# Patient Record
Sex: Female | Born: 1945
Health system: Southern US, Community
[De-identification: ages and names within clinical notes are randomized; demographics above are authoritative.]

## PROBLEM LIST (undated history)

## (undated) DIAGNOSIS — Z9989 Dependence on other enabling machines and devices: Secondary | ICD-10-CM

## (undated) DIAGNOSIS — I471 Supraventricular tachycardia, unspecified: Secondary | ICD-10-CM

## (undated) DIAGNOSIS — Z87442 Personal history of urinary calculi: Secondary | ICD-10-CM

## (undated) DIAGNOSIS — R7989 Other specified abnormal findings of blood chemistry: Secondary | ICD-10-CM

## (undated) DIAGNOSIS — K819 Cholecystitis, unspecified: Secondary | ICD-10-CM

## (undated) DIAGNOSIS — Z8619 Personal history of other infectious and parasitic diseases: Secondary | ICD-10-CM

## (undated) DIAGNOSIS — I499 Cardiac arrhythmia, unspecified: Secondary | ICD-10-CM

## (undated) DIAGNOSIS — Z9289 Personal history of other medical treatment: Secondary | ICD-10-CM

## (undated) DIAGNOSIS — M1990 Unspecified osteoarthritis, unspecified site: Secondary | ICD-10-CM

## (undated) DIAGNOSIS — F32A Depression, unspecified: Secondary | ICD-10-CM

## (undated) DIAGNOSIS — Z7901 Long term (current) use of anticoagulants: Secondary | ICD-10-CM

## (undated) DIAGNOSIS — G4733 Obstructive sleep apnea (adult) (pediatric): Secondary | ICD-10-CM

## (undated) DIAGNOSIS — I779 Disorder of arteries and arterioles, unspecified: Secondary | ICD-10-CM

## (undated) DIAGNOSIS — I739 Peripheral vascular disease, unspecified: Secondary | ICD-10-CM

## (undated) DIAGNOSIS — K219 Gastro-esophageal reflux disease without esophagitis: Secondary | ICD-10-CM

## (undated) DIAGNOSIS — R7303 Prediabetes: Secondary | ICD-10-CM

## (undated) DIAGNOSIS — M79629 Pain in unspecified upper arm: Secondary | ICD-10-CM

## (undated) DIAGNOSIS — Z87448 Personal history of other diseases of urinary system: Secondary | ICD-10-CM

## (undated) DIAGNOSIS — Z973 Presence of spectacles and contact lenses: Secondary | ICD-10-CM

## (undated) DIAGNOSIS — G43909 Migraine, unspecified, not intractable, without status migrainosus: Secondary | ICD-10-CM

## (undated) DIAGNOSIS — I10 Essential (primary) hypertension: Secondary | ICD-10-CM

## (undated) DIAGNOSIS — E871 Hypo-osmolality and hyponatremia: Secondary | ICD-10-CM

## (undated) DIAGNOSIS — I2510 Atherosclerotic heart disease of native coronary artery without angina pectoris: Secondary | ICD-10-CM

## (undated) DIAGNOSIS — N179 Acute kidney failure, unspecified: Secondary | ICD-10-CM

## (undated) DIAGNOSIS — N119 Chronic tubulo-interstitial nephritis, unspecified: Secondary | ICD-10-CM

## (undated) DIAGNOSIS — L0390 Cellulitis, unspecified: Secondary | ICD-10-CM

## (undated) DIAGNOSIS — J40 Bronchitis, not specified as acute or chronic: Secondary | ICD-10-CM

## (undated) DIAGNOSIS — M069 Rheumatoid arthritis, unspecified: Secondary | ICD-10-CM

## (undated) DIAGNOSIS — F329 Major depressive disorder, single episode, unspecified: Secondary | ICD-10-CM

## (undated) DIAGNOSIS — H5789 Other specified disorders of eye and adnexa: Secondary | ICD-10-CM

## (undated) DIAGNOSIS — I480 Paroxysmal atrial fibrillation: Secondary | ICD-10-CM

## (undated) DIAGNOSIS — I4892 Unspecified atrial flutter: Secondary | ICD-10-CM

## (undated) HISTORY — DX: Depression, unspecified: F32.A

## (undated) HISTORY — DX: Unspecified atrial flutter: I48.92

## (undated) HISTORY — DX: Major depressive disorder, single episode, unspecified: F32.9

## (undated) HISTORY — DX: Bronchitis, not specified as acute or chronic: J40

## (undated) HISTORY — PX: COLONOSCOPY: SHX174

## (undated) HISTORY — DX: Paroxysmal atrial fibrillation: I48.0

## (undated) HISTORY — DX: Hypo-osmolality and hyponatremia: E87.1

## (undated) HISTORY — DX: Other specified disorders of eye and adnexa: H57.89

## (undated) HISTORY — DX: Supraventricular tachycardia: I47.1

## (undated) HISTORY — PX: KNEE ARTHROSCOPY W/ MENISCAL REPAIR: SHX1877

## (undated) HISTORY — DX: Migraine, unspecified, not intractable, without status migrainosus: G43.909

## (undated) HISTORY — DX: Chronic tubulo-interstitial nephritis, unspecified: N11.9

## (undated) HISTORY — DX: Supraventricular tachycardia, unspecified: I47.10

## (undated) HISTORY — DX: Essential (primary) hypertension: I10

---

## 2002-12-30 ENCOUNTER — Other Ambulatory Visit: Admission: RE | Admit: 2002-12-30 | Discharge: 2002-12-30 | Payer: Self-pay | Admitting: Obstetrics and Gynecology

## 2004-03-02 ENCOUNTER — Other Ambulatory Visit: Admission: RE | Admit: 2004-03-02 | Discharge: 2004-03-02 | Payer: Self-pay | Admitting: Obstetrics and Gynecology

## 2004-03-08 ENCOUNTER — Ambulatory Visit (HOSPITAL_COMMUNITY): Admission: RE | Admit: 2004-03-08 | Discharge: 2004-03-08 | Payer: Self-pay | Admitting: Obstetrics and Gynecology

## 2004-06-21 ENCOUNTER — Ambulatory Visit (HOSPITAL_COMMUNITY): Admission: RE | Admit: 2004-06-21 | Discharge: 2004-06-21 | Payer: Self-pay | Admitting: Obstetrics and Gynecology

## 2004-08-22 ENCOUNTER — Ambulatory Visit: Payer: Self-pay | Admitting: Internal Medicine

## 2004-10-17 ENCOUNTER — Ambulatory Visit: Payer: Self-pay | Admitting: Internal Medicine

## 2005-05-08 ENCOUNTER — Other Ambulatory Visit: Admission: RE | Admit: 2005-05-08 | Discharge: 2005-05-08 | Payer: Self-pay | Admitting: Obstetrics and Gynecology

## 2005-12-19 ENCOUNTER — Ambulatory Visit: Payer: Self-pay | Admitting: Internal Medicine

## 2005-12-26 ENCOUNTER — Ambulatory Visit: Payer: Self-pay | Admitting: Internal Medicine

## 2006-05-14 ENCOUNTER — Ambulatory Visit: Payer: Self-pay | Admitting: Internal Medicine

## 2006-07-24 ENCOUNTER — Ambulatory Visit: Payer: Self-pay | Admitting: Internal Medicine

## 2006-08-05 ENCOUNTER — Ambulatory Visit: Payer: Self-pay

## 2006-08-05 ENCOUNTER — Encounter: Payer: Self-pay | Admitting: Cardiology

## 2006-10-23 ENCOUNTER — Ambulatory Visit: Payer: Self-pay | Admitting: Internal Medicine

## 2007-01-11 ENCOUNTER — Ambulatory Visit: Payer: Self-pay | Admitting: Family Medicine

## 2007-03-06 ENCOUNTER — Ambulatory Visit: Payer: Self-pay | Admitting: Internal Medicine

## 2007-03-17 ENCOUNTER — Encounter: Admission: RE | Admit: 2007-03-17 | Discharge: 2007-06-15 | Payer: Self-pay | Admitting: Internal Medicine

## 2007-05-05 ENCOUNTER — Ambulatory Visit: Payer: Self-pay | Admitting: Internal Medicine

## 2007-07-07 ENCOUNTER — Encounter: Admission: RE | Admit: 2007-07-07 | Discharge: 2007-09-24 | Payer: Self-pay | Admitting: Internal Medicine

## 2007-07-28 ENCOUNTER — Encounter: Payer: Self-pay | Admitting: Internal Medicine

## 2007-07-28 DIAGNOSIS — F4323 Adjustment disorder with mixed anxiety and depressed mood: Secondary | ICD-10-CM | POA: Insufficient documentation

## 2007-07-28 DIAGNOSIS — R002 Palpitations: Secondary | ICD-10-CM | POA: Insufficient documentation

## 2007-07-28 DIAGNOSIS — Z87898 Personal history of other specified conditions: Secondary | ICD-10-CM

## 2007-08-12 ENCOUNTER — Telehealth: Payer: Self-pay | Admitting: Internal Medicine

## 2007-08-14 ENCOUNTER — Encounter: Payer: Self-pay | Admitting: Internal Medicine

## 2007-08-14 ENCOUNTER — Ambulatory Visit: Payer: Self-pay | Admitting: Internal Medicine

## 2007-08-14 DIAGNOSIS — H6690 Otitis media, unspecified, unspecified ear: Secondary | ICD-10-CM | POA: Insufficient documentation

## 2007-08-14 DIAGNOSIS — M25569 Pain in unspecified knee: Secondary | ICD-10-CM

## 2007-08-14 DIAGNOSIS — J309 Allergic rhinitis, unspecified: Secondary | ICD-10-CM | POA: Insufficient documentation

## 2007-08-25 ENCOUNTER — Encounter: Admission: RE | Admit: 2007-08-25 | Discharge: 2007-08-25 | Payer: Self-pay | Admitting: Internal Medicine

## 2007-08-26 ENCOUNTER — Encounter: Payer: Self-pay | Admitting: Internal Medicine

## 2007-09-03 ENCOUNTER — Ambulatory Visit: Payer: Self-pay | Admitting: Internal Medicine

## 2007-09-16 ENCOUNTER — Encounter: Payer: Self-pay | Admitting: Internal Medicine

## 2007-10-03 ENCOUNTER — Encounter (INDEPENDENT_AMBULATORY_CARE_PROVIDER_SITE_OTHER): Payer: Self-pay | Admitting: *Deleted

## 2007-10-16 DIAGNOSIS — I480 Paroxysmal atrial fibrillation: Secondary | ICD-10-CM

## 2007-10-16 HISTORY — DX: Paroxysmal atrial fibrillation: I48.0

## 2007-11-14 ENCOUNTER — Ambulatory Visit: Payer: Self-pay | Admitting: Internal Medicine

## 2007-11-14 ENCOUNTER — Encounter (INDEPENDENT_AMBULATORY_CARE_PROVIDER_SITE_OTHER): Payer: Self-pay | Admitting: *Deleted

## 2007-11-14 DIAGNOSIS — R05 Cough: Secondary | ICD-10-CM

## 2007-11-14 DIAGNOSIS — R87619 Unspecified abnormal cytological findings in specimens from cervix uteri: Secondary | ICD-10-CM | POA: Insufficient documentation

## 2007-12-02 ENCOUNTER — Other Ambulatory Visit: Admission: RE | Admit: 2007-12-02 | Discharge: 2007-12-02 | Payer: Self-pay | Admitting: Gynecology

## 2008-02-16 ENCOUNTER — Encounter: Payer: Self-pay | Admitting: Internal Medicine

## 2008-02-17 ENCOUNTER — Ambulatory Visit: Payer: Self-pay | Admitting: Internal Medicine

## 2008-02-17 DIAGNOSIS — R55 Syncope and collapse: Secondary | ICD-10-CM | POA: Insufficient documentation

## 2008-02-19 ENCOUNTER — Encounter: Payer: Self-pay | Admitting: Internal Medicine

## 2008-02-20 ENCOUNTER — Encounter: Payer: Self-pay | Admitting: Internal Medicine

## 2008-02-20 LAB — CONVERTED CEMR LAB
ALT: 15 units/L (ref 0–35)
Alkaline Phosphatase: 51 units/L (ref 39–117)
BUN: 15 mg/dL (ref 6–23)
Bilirubin Urine: NEGATIVE
Bilirubin, Direct: 0.2 mg/dL (ref 0.0–0.3)
Calcium: 9.1 mg/dL (ref 8.4–10.5)
Cholesterol: 196 mg/dL (ref 0–200)
Creatinine, Ser: 0.79 mg/dL (ref 0.40–1.20)
HCT: 41.7 % (ref 36.0–46.0)
Hemoglobin, Urine: NEGATIVE
LDL Cholesterol: 99 mg/dL (ref 0–99)
MCHC: 32.9 g/dL (ref 30.0–36.0)
MCV: 100.5 fL — ABNORMAL HIGH (ref 78.0–100.0)
Nitrite: NEGATIVE
Protein, ur: NEGATIVE mg/dL
Sodium: 137 meq/L (ref 135–145)
Specific Gravity, Urine: 1.01 (ref 1.005–1.03)
Total Bilirubin: 0.9 mg/dL (ref 0.3–1.2)
Triglycerides: 93 mg/dL (ref <150)
Urine Glucose: NEGATIVE mg/dL
Urobilinogen, UA: 0.2 (ref 0.0–1.0)
VLDL: 19 mg/dL (ref 0–40)
Vitamin B-12: 542 pg/mL (ref 211–911)
WBC: 5.7 10*3/uL (ref 4.0–10.5)

## 2008-02-26 ENCOUNTER — Ambulatory Visit: Payer: Self-pay | Admitting: Cardiology

## 2008-04-07 ENCOUNTER — Ambulatory Visit: Payer: Self-pay | Admitting: Internal Medicine

## 2008-04-07 DIAGNOSIS — R61 Generalized hyperhidrosis: Secondary | ICD-10-CM

## 2008-05-03 ENCOUNTER — Encounter: Payer: Self-pay | Admitting: Internal Medicine

## 2008-06-03 ENCOUNTER — Ambulatory Visit: Payer: Self-pay | Admitting: Cardiovascular Disease

## 2008-06-04 ENCOUNTER — Observation Stay (HOSPITAL_COMMUNITY): Admission: EM | Admit: 2008-06-04 | Discharge: 2008-06-05 | Payer: Self-pay | Admitting: Emergency Medicine

## 2008-06-04 ENCOUNTER — Encounter: Payer: Self-pay | Admitting: Cardiology

## 2008-06-08 ENCOUNTER — Ambulatory Visit: Payer: Self-pay | Admitting: Cardiology

## 2008-06-23 ENCOUNTER — Ambulatory Visit: Payer: Self-pay | Admitting: Internal Medicine

## 2008-06-23 ENCOUNTER — Telehealth: Payer: Self-pay | Admitting: Internal Medicine

## 2008-06-23 DIAGNOSIS — R51 Headache: Secondary | ICD-10-CM

## 2008-06-23 DIAGNOSIS — H5450 Low vision, one eye, unspecified eye: Secondary | ICD-10-CM | POA: Insufficient documentation

## 2008-06-23 DIAGNOSIS — H01009 Unspecified blepharitis unspecified eye, unspecified eyelid: Secondary | ICD-10-CM | POA: Insufficient documentation

## 2008-06-23 DIAGNOSIS — R519 Headache, unspecified: Secondary | ICD-10-CM | POA: Insufficient documentation

## 2008-07-01 ENCOUNTER — Ambulatory Visit: Payer: Self-pay | Admitting: Cardiology

## 2008-07-06 ENCOUNTER — Ambulatory Visit: Payer: Self-pay | Admitting: Cardiology

## 2008-08-09 ENCOUNTER — Ambulatory Visit: Payer: Self-pay | Admitting: Internal Medicine

## 2008-09-21 ENCOUNTER — Inpatient Hospital Stay (HOSPITAL_COMMUNITY): Admission: EM | Admit: 2008-09-21 | Discharge: 2008-09-22 | Payer: Self-pay | Admitting: Emergency Medicine

## 2008-09-21 ENCOUNTER — Ambulatory Visit: Payer: Self-pay | Admitting: Cardiology

## 2008-09-29 ENCOUNTER — Ambulatory Visit: Payer: Self-pay | Admitting: Cardiology

## 2008-10-18 ENCOUNTER — Encounter: Payer: Self-pay | Admitting: Internal Medicine

## 2008-12-01 ENCOUNTER — Ambulatory Visit: Payer: Self-pay | Admitting: Cardiology

## 2008-12-15 ENCOUNTER — Encounter: Payer: Self-pay | Admitting: Gynecology

## 2008-12-15 ENCOUNTER — Other Ambulatory Visit: Admission: RE | Admit: 2008-12-15 | Discharge: 2008-12-15 | Payer: Self-pay | Admitting: Gynecology

## 2008-12-15 ENCOUNTER — Ambulatory Visit: Payer: Self-pay | Admitting: Gynecology

## 2008-12-22 ENCOUNTER — Ambulatory Visit: Payer: Self-pay | Admitting: Gynecology

## 2008-12-24 ENCOUNTER — Telehealth: Payer: Self-pay | Admitting: Internal Medicine

## 2009-01-03 ENCOUNTER — Encounter: Admission: RE | Admit: 2009-01-03 | Discharge: 2009-01-03 | Payer: Self-pay | Admitting: Gynecology

## 2009-01-07 ENCOUNTER — Ambulatory Visit: Payer: Self-pay | Admitting: Gynecology

## 2009-02-09 ENCOUNTER — Ambulatory Visit: Payer: Self-pay | Admitting: Internal Medicine

## 2009-02-09 DIAGNOSIS — K5289 Other specified noninfective gastroenteritis and colitis: Secondary | ICD-10-CM

## 2009-04-02 ENCOUNTER — Telehealth: Payer: Self-pay | Admitting: Internal Medicine

## 2009-04-12 ENCOUNTER — Ambulatory Visit: Payer: Self-pay | Admitting: Internal Medicine

## 2009-04-12 DIAGNOSIS — M791 Myalgia, unspecified site: Secondary | ICD-10-CM

## 2009-04-16 ENCOUNTER — Encounter: Payer: Self-pay | Admitting: Internal Medicine

## 2009-04-18 LAB — CONVERTED CEMR LAB
ALT: 23 units/L (ref 0–35)
AST: 29 units/L (ref 0–37)
Albumin: 4.5 g/dL (ref 3.5–5.2)
Alkaline Phosphatase: 65 units/L (ref 39–117)
BUN: 15 mg/dL (ref 6–23)
Bilirubin, Direct: 0.2 mg/dL (ref 0.0–0.3)
Chloride: 100 meq/L (ref 96–112)
Creatinine, Ser: 0.87 mg/dL (ref 0.40–1.20)
Glucose, Bld: 79 mg/dL (ref 70–99)
HCT: 38.7 % (ref 36.0–46.0)
HDL: 85 mg/dL (ref >39)
Hemoglobin, Urine: NEGATIVE
Ketones, ur: NEGATIVE mg/dL
Lymphocytes Relative: 38 % (ref 12–46)
MCV: 98.5 fL (ref 78.0–100.0)
Neutro Abs: 3.4 10*3/uL (ref 1.7–7.7)
Neutrophils Relative %: 54 % (ref 43–77)
Platelets: 195 10*3/uL (ref 150–400)
Potassium: 4.2 meq/L (ref 3.5–5.3)
RDW: 13.5 % (ref 11.5–15.5)
Sodium: 133 meq/L — ABNORMAL LOW (ref 135–145)
TSH: 2.617 microintl units/mL (ref 0.350–4.500)
Total CHOL/HDL Ratio: 2
Urobilinogen, UA: 0.2 (ref 0.0–1.0)
VLDL: 14 mg/dL (ref 0–40)

## 2009-04-20 ENCOUNTER — Ambulatory Visit: Payer: Self-pay | Admitting: Internal Medicine

## 2009-04-21 ENCOUNTER — Telehealth: Payer: Self-pay | Admitting: Internal Medicine

## 2009-04-26 ENCOUNTER — Telehealth: Payer: Self-pay | Admitting: Cardiology

## 2009-05-05 ENCOUNTER — Encounter (INDEPENDENT_AMBULATORY_CARE_PROVIDER_SITE_OTHER): Payer: Self-pay | Admitting: *Deleted

## 2009-05-18 ENCOUNTER — Ambulatory Visit: Payer: Self-pay | Admitting: Cardiology

## 2009-05-27 ENCOUNTER — Telehealth: Payer: Self-pay | Admitting: Cardiology

## 2009-06-21 ENCOUNTER — Telehealth: Payer: Self-pay | Admitting: Cardiology

## 2009-07-18 ENCOUNTER — Ambulatory Visit: Payer: Self-pay | Admitting: Internal Medicine

## 2009-07-18 DIAGNOSIS — J040 Acute laryngitis: Secondary | ICD-10-CM | POA: Insufficient documentation

## 2009-08-04 ENCOUNTER — Ambulatory Visit: Payer: Self-pay | Admitting: Endocrinology

## 2009-08-04 DIAGNOSIS — M25559 Pain in unspecified hip: Secondary | ICD-10-CM | POA: Insufficient documentation

## 2009-08-04 DIAGNOSIS — R109 Unspecified abdominal pain: Secondary | ICD-10-CM | POA: Insufficient documentation

## 2009-08-29 ENCOUNTER — Ambulatory Visit: Payer: Self-pay | Admitting: Gynecology

## 2009-10-12 ENCOUNTER — Ambulatory Visit: Payer: Self-pay | Admitting: Internal Medicine

## 2009-10-12 DIAGNOSIS — Z87891 Personal history of nicotine dependence: Secondary | ICD-10-CM

## 2009-10-12 DIAGNOSIS — J069 Acute upper respiratory infection, unspecified: Secondary | ICD-10-CM | POA: Insufficient documentation

## 2009-10-12 DIAGNOSIS — M25519 Pain in unspecified shoulder: Secondary | ICD-10-CM

## 2009-11-08 ENCOUNTER — Encounter: Payer: Self-pay | Admitting: Internal Medicine

## 2009-11-08 LAB — CONVERTED CEMR LAB
BUN: 14 mg/dL (ref 6–23)
Chloride: 99 meq/L (ref 96–112)
Creatinine, Ser: 0.81 mg/dL (ref 0.40–1.20)
Hemoglobin, Urine: NEGATIVE
Nitrite: NEGATIVE
Sodium: 134 meq/L — ABNORMAL LOW (ref 135–145)
Specific Gravity, Urine: 1.01 (ref 1.005–1.030)
Urine Glucose: NEGATIVE mg/dL
Vitamin B-12: 1032 pg/mL — ABNORMAL HIGH (ref 211–911)

## 2009-11-11 ENCOUNTER — Ambulatory Visit: Payer: Self-pay | Admitting: Internal Medicine

## 2009-11-11 DIAGNOSIS — N281 Cyst of kidney, acquired: Secondary | ICD-10-CM | POA: Insufficient documentation

## 2009-11-16 ENCOUNTER — Encounter: Admission: RE | Admit: 2009-11-16 | Discharge: 2009-11-16 | Payer: Self-pay | Admitting: Internal Medicine

## 2009-12-16 ENCOUNTER — Ambulatory Visit: Payer: Self-pay | Admitting: Gynecology

## 2009-12-16 ENCOUNTER — Other Ambulatory Visit: Admission: RE | Admit: 2009-12-16 | Discharge: 2009-12-16 | Payer: Self-pay | Admitting: Gynecology

## 2010-01-04 ENCOUNTER — Encounter: Admission: RE | Admit: 2010-01-04 | Discharge: 2010-01-04 | Payer: Self-pay | Admitting: Gynecology

## 2010-02-15 ENCOUNTER — Ambulatory Visit: Payer: Self-pay | Admitting: Gynecology

## 2010-05-16 ENCOUNTER — Ambulatory Visit: Payer: Self-pay | Admitting: Cardiology

## 2010-06-22 ENCOUNTER — Telehealth: Payer: Self-pay | Admitting: Internal Medicine

## 2010-08-09 ENCOUNTER — Ambulatory Visit: Payer: Self-pay | Admitting: Internal Medicine

## 2010-10-18 ENCOUNTER — Ambulatory Visit
Admission: RE | Admit: 2010-10-18 | Discharge: 2010-10-18 | Payer: Self-pay | Source: Home / Self Care | Attending: Internal Medicine | Admitting: Internal Medicine

## 2010-10-18 DIAGNOSIS — H1030 Unspecified acute conjunctivitis, unspecified eye: Secondary | ICD-10-CM

## 2010-10-18 HISTORY — DX: Unspecified acute conjunctivitis, unspecified eye: H10.30

## 2010-10-19 ENCOUNTER — Telehealth: Payer: Self-pay | Admitting: Internal Medicine

## 2010-11-05 ENCOUNTER — Encounter: Payer: Self-pay | Admitting: Internal Medicine

## 2010-11-14 NOTE — Assessment & Plan Note (Signed)
Summary: F1Y/ANAS   Visit Type:  1 yr f/u Primary Provider:  Tresa Garter MD  CC:  pt states she feels like her heart stops every once in a while for just a second and says this makes her burp...otherwise no other complaints today.  History of Present Illness: Amanda Wilkins comes in today for followup of her history of atrial for ablation.  She's had no clinical recurrence.  She continues to exercise on a regular basis. She is very compliant with her medications. She remains on aspirin 325 mg a day.  Current Medications (verified): 1)  Atenolol 50 Mg  Tabs (Atenolol) .... 1/2 Tab Once Daily 2)  Vitamin D3 1000 Unit  Tabs (Cholecalciferol) .[name].. 1 Qd 3)  Aspirin 325 Mg Tabs (Aspirin) .[name].. 1 Tab Once Daily 4)  Diltiazem Hcl Er Beads 180 Mg Xr24h-Cap (Diltiazem Hcl Er Beads) .[name].. 1 Tab Qd 5)  Zoloft 50 Mg Tabs (Sertraline Hcl) .[name].. 1 By Mouth Daily 6)  Premarin 0.3 Mg Tabs (Estrogens Conjugated) .[name].. 1 Tab Once Daily 7)  Medroxyprogesterone Acetate 10 Mg Tabs (Medroxyprogesterone Acetate) .[name].. 10 Days A Month  Allergies: 1)  ! Pcn  Past History:  Past Medical History: Last updated: 11/30/2008 Atrial fibrillation, h/o last episode 8/09 Dr Daleen Squibb Depression Allergic rhinitis Palpitations migraines obesity Hypertension palpatations  Past Surgical History: Last updated: 07/28/2007 Caesarean section  Family History: Last updated: 10/12/2009 Family History Hypertension Family History Breast cancer 1st degree relative - sister 76 yo Mother: deceased @ 42- cancer Father: deceased @ 74-cancer  B pancr CA  Social History: Last updated: 08/14/2007 Former Smoker Alcohol use-yes  Risk Factors: Smoking Status: quit (11/11/2009)  Review of Systems       negative history of present illness  Vital Signs:  Patient profile:   65 year old female Height:      65.5 inches Weight:      205 pounds BMI:     33.72 Pulse rate:   78 / minute Pulse rhythm:   regular BP  sitting:   138 / 80  (left arm) Cuff size:   large  Vitals Entered By: Danielle Rankin, CMA (May 16, 2010 3:18 PM)  Physical Exam  General:  obese.   Head:  normocephalic and atraumatic Eyes:  PERRLA/EOM intact; conjunctiva and lids normal. Neck:  Neck supple, no JVD. No masses, thyromegaly or abnormal cervical nodes. Chest Wall:  no deformities or breast masses noted Lungs:  Clear bilaterally to auscultation and percussion. Heart:  PMI poorly appreciated, regular rate and rhythm, normal S1-S2. Minimal peripheral edema. Carotid strokes equal bilaterally without bruits Msk:  Back normal, normal gait. Muscle strength and tone normal. Pulses:  pulses normal in all 4 extremities Extremities:  trace left pedal edema and trace right pedal edema.   Neurologic:  Alert and oriented x 3.   EKG  Procedure date:  05/16/2010  Findings:      normal sinus rhythm, low voltage QRS.  Impression & Recommendations:  Problem # 1:  ATRIAL FIBRILLATION (ICD-427.31)  Her updated medication list for this problem includes:    Atenolol 50 Mg Tabs (Atenolol) .[name]... 1/2 tab once daily    Aspirin 325 Mg Tabs (Aspirin) .[name]... 1 tab once daily  Problem # 2:  PALPITATIONS (ICD-785.1)  Her updated medication list for this problem includes:    Atenolol 50 Mg Tabs (Atenolol) .[name]... 1/2 tab once daily    Aspirin 325 Mg Tabs (Aspirin) .[name]... 1 tab once daily    Diltiazem  Hcl Er Beads 180 Mg Xr24h-cap (Diltiazem hcl er beads) .[name]... 1 tab qd  Orders: EKG w/ Interpretation (93000)  Problem # 3:  SYNCOPE (ICD-780.2) Assessment: Improved  Her updated medication list for this problem includes:    Atenolol 50 Mg Tabs (Atenolol) .[name]... 1/2 tab once daily    Aspirin 325 Mg Tabs (Aspirin) .[name]... 1 tab once daily    Diltiazem Hcl Er Beads 180 Mg Xr24h-cap (Diltiazem hcl er beads) .[name]... 1 tab qd  Patient Instructions: 1)  Your physician recommends that you schedule a follow-up appointment in: 1 year with Dr. Daleen Squibb 2)   Your physician recommends that you continue on your current medications as directed. Please refer to the Current Medication list given to you today.

## 2010-11-14 NOTE — Assessment & Plan Note (Signed)
Summary: 6 MTH FU--$50--LABS WILL BE DONE AT ANOTHER FACILITY PER PT-STC   Vital Signs:  Patient profile:   65 year old female Weight:      199 pounds Temp:     98.1 degrees F oral Pulse rate:   75 / minute BP sitting:   124 / 76  Vitals Entered By: Tora Perches (November 11, 2009 10:14 AM) CC: f/u Is Patient Diabetic? No   Primary Care Provider:  Tresa Garter MD  CC:  f/u.  History of Present Illness: The patient presents for a follow up of hypertension, renal cyst, A fib, nearsyncope  Preventive Screening-Counseling & Management  Alcohol-Tobacco     Smoking Status: quit  Current Medications (verified): 1)  Atenolol 50 Mg  Tabs (Atenolol) .[name].. 1 By Mouth Qd 2)  Vitamin B-12 Cr 1000 Mcg  Tbcr (Cyanocobalamin) .[name].. 1 Tab Once Daily 3)  Vitamin D3 1000 Unit  Tabs (Cholecalciferol) .[name].. 1 Qd 4)  Aspirin 325 Mg Tabs (Aspirin) .... Take 1/2 Tab By Mouth Every Day 5)  Diltiazem Hcl Er Beads 180 Mg Xr24h-Cap (Diltiazem Hcl Er Beads) .[name].. 1 Tab Qd 6)  Zoloft 50 Mg Tabs (Sertraline Hcl) .[name].. 1 By Mouth Daily 7)  Premarin 0.625 Mg Tabs (Estrogens Conjugated) .... Take 1 By Mouth Qd 8)  Medroxyprogesterone Acetate 10 Mg Tabs (Medroxyprogesterone Acetate) .[name].. 10 Days A Month  Allergies: 1)  ! Pcn  Past History:  Past Medical History: Last updated: 11/30/2008 Atrial fibrillation, h/o last episode 8/09 Dr Daleen Squibb Depression Allergic rhinitis Palpitations migraines obesity Hypertension palpatations  Social History: Last updated: 08/14/2007 Former Smoker Alcohol use-yes  Physical Exam  General:  NAD Nose:  WNL Mouth:  WNL Lungs:  CTA Heart:  Normal rate and regular rhythm. S1 and S2 normal without gallop, murmur, click, rub or other extra sounds. Abdomen:  Bowel sounds positive,abdomen soft and non-tender without masses, organomegaly or hernias noted.   Impression & Recommendations:  Problem # 1:  FIBRILLATION, ATRIAL (ICD-427.31) resolved Assessment  Comment Only  Her updated medication list for this problem includes:    Atenolol 50 Mg Tabs (Atenolol) .[name]... 1 by mouth qd    Aspirin 325 Mg Tabs (Aspirin) .[name]... Take 1/2 tab by mouth every day    Diltiazem Hcl Er Beads 180 Mg Xr24h-cap (Diltiazem hcl er beads) .[name]... 1 tab qd  Problem # 2:  HEADACHE (ICD-784.0) Assessment: Improved  Her updated medication list for this problem includes:    Atenolol 50 Mg Tabs (Atenolol) .[name]... 1 by mouth qd    Aspirin 325 Mg Tabs (Aspirin) .[name]... Take 1/2 tab by mouth every day  Problem # 3:  SYNCOPE (ICD-780.2) no recurrence  Problem # 4:  DEPRESSION (ICD-311) Assessment: Improved  The following medications were removed from the medication list:    Alprazolam 0.5 Mg Tabs (Alprazolam) .[name]... 1/2 to 1 two times a day as needed Her updated medication list for this problem includes:    Zoloft 50 Mg Tabs (Sertraline hcl) .[name]... 1 by mouth daily  Problem # 5:  RENAL CYST (ICD-593.2) Was checked at GYN office Dr Henderson Cloud 3-4 y ago Orders: Radiology Referral (Radiology) f/u  Complete Medication List: 1)  Atenolol 50 Mg Tabs (Atenolol) .[name].. 1 by mouth qd 2)  Vitamin B-12 Cr 1000 Mcg Tbcr (Cyanocobalamin) .[name].. 1 tab once daily 3)  Vitamin D3 1000 Unit Tabs (Cholecalciferol) .[name].. 1 qd 4)  Aspirin 325 Mg Tabs (Aspirin) .... Take 1/2 tab by mouth every day 5)  Diltiazem  Hcl Er Beads 180 Mg Xr24h-cap (Diltiazem hcl er beads) .[name].. 1 tab qd 6)  Zoloft 50 Mg Tabs (Sertraline hcl) .[name].. 1 by mouth daily 7)  Premarin 0.625 Mg Tabs (Estrogens conjugated) .... Take 1 by mouth qd 8)  Medroxyprogesterone Acetate 10 Mg Tabs (Medroxyprogesterone acetate) .[name].. 10 days a month  Patient Instructions: 1)  Please schedule a follow-up appointment in 6 months well w/labs v70.0.

## 2010-11-14 NOTE — Progress Notes (Signed)
Summary: REQ FOR RX FOR CRUISE  Phone Note Call from Patient   Summary of Call: Patient is requesting rx for motion sickness med. She is going on a 13 day cruise. She is also concerned about her "heart problem" and wants to make sure any rx prescribed will not affect this.  Initial call taken by: Lamar Sprinkles, CMA,  June 22, 2010 3:16 PM  Follow-up for Phone Call        OK scop patch: I can't guarantee no side effects - pls read drug info; OTC antivert can be used as well with less potential for side effects Follow-up by: Tresa Garter MD,  June 22, 2010 10:56 PM  Additional Follow-up for Phone Call Additional follow up Details #1::        Spoke with pt and informed her of Dr Hyman Bible advisement. Pt states she will try Antivert OTC and will see about the transderm patch with how much cost is on prescription. I sent rx electronically. Additional Follow-up by: Ami Bullins CMA,  June 23, 2010 8:43 AM    New/Updated Medications: TRANSDERM-SCOP 1.5 MG PT72 (SCOPOLAMINE BASE) change q 3 days Prescriptions: TRANSDERM-SCOP 1.5 MG PT72 (SCOPOLAMINE BASE) change q 3 days  #5 x 0   Entered by:   Ami Bullins CMA   Authorized by:   Tresa Garter MD   Signed by:   Bill Salinas CMA on 06/23/2010   Method used:   Electronically to        [facility] # [phone]* (retail)       [address]       [place], [place]  [address]       Ph: [phone]       Fax: [phone]   RxID:   [id number] TRANSDERM-SCOP 1.5 MG PT72 (SCOPOLAMINE BASE) change q 3 days  #5 x 0   Entered and Authorized by:   Tresa Garter MD   Signed by:   Tresa Garter MD on 06/22/2010   Method used:   Print then Give to Patient   RxID:   [id number]

## 2010-11-14 NOTE — Assessment & Plan Note (Signed)
Summary: FLU SHOT/PER STACY/PN  Nurse Visit   Allergies: 1)  ! Pcn  Orders Added: 1)  Admin 1st Vaccine [90471] 2)  Flu Vaccine 29yrs + [16109]       Flu Vaccine Consent Questions     Do you have a history of severe allergic reactions to this vaccine? no    Any prior history of allergic reactions to egg and/or gelatin? no    Do you have a sensitivity to the preservative Thimersol? no    Do you have a past history of Guillan-Barre Syndrome? no    Do you currently have an acute febrile illness? no    Have you ever had a severe reaction to latex? no    Vaccine information given and explained to patient? yes    Are you currently pregnant? no    Lot Number:[id number]   Exp Date:04/14/2011   Site Given  RIGHT Deltoid IM

## 2010-11-16 NOTE — Assessment & Plan Note (Signed)
Summary: GOOEY EYES --COUGH--STC   Vital Signs:  Patient profile:   65 year old female Height:      65.5 inches Weight:      205 pounds BMI:     33.72 Temp:     98.9 degrees F oral Pulse rate:   72 / minute Pulse rhythm:   regular Resp:     16 per minute BP sitting:   120 / 70  (left arm) Cuff size:   large  Vitals Entered By: Lanier Prude, CMA(AAMA) (October 18, 2010 4:37 PM) CC: cough, sore throat, sinus drainage, eyes draining X 3 days Is Patient Diabetic? No   Primary Care Provider:  Georgina Quint Plotnikov MD  CC:  cough, sore throat, sinus drainage, and eyes draining X 3 days.  History of Present Illness: The patient presents with complaints of sore throat, fever, cough, sinus congestion and drainge of several days duration. Not better with OTC meds. Chest hurts with coughing. Can't sleep due to cough. Muscle aches are present.  The mucus is colored green. Eyes with d/c  Current Medications (verified): 1)  Atenolol 50 Mg  Tabs (Atenolol) .... 1/2 Tab Once Daily 2)  Vitamin D3 1000 Unit  Tabs (Cholecalciferol) .[name].. 1 Qd 3)  Aspirin 325 Mg Tabs (Aspirin) .[name].. 1 Tab Once Daily 4)  Diltiazem Hcl Er Beads 180 Mg Xr24h-Cap (Diltiazem Hcl Er Beads) .[name].. 1 Tab Qd 5)  Zoloft 50 Mg Tabs (Sertraline Hcl) .[name].. 1 By Mouth Daily 6)  Premarin 0.3 Mg Tabs (Estrogens Conjugated) .[name].. 1 Tab Once Daily 7)  Medroxyprogesterone Acetate 10 Mg Tabs (Medroxyprogesterone Acetate) .[name].. 10 Days A Month 8)  Transderm-Scop 1.5 Mg Pt72 (Scopolamine Base) .... Change Q 3 Days  Allergies (verified): 1)  ! Pcn  Past History:  Past Medical History: Last updated: 11/30/2008 Atrial fibrillation, h/o last episode 8/09 Dr Daleen Squibb Depression Allergic rhinitis Palpitations migraines obesity Hypertension palpatations  Social History: Last updated: 08/14/2007 Former Smoker Alcohol use-yes  Review of Systems  The patient denies fever, chest pain, prolonged cough, abdominal pain, melena, difficulty  walking, and depression.    Physical Exam  General:  NAD Eyes:  mild conjunctivitis B Ears:  TMs NL B Mouth:  Erythematous throat and intranasal mucosa c/w URI  Neck:  No deformities, masses, or tenderness noted. Lungs:  CTA Heart:  Normal rate and regular rhythm. S1 and S2 normal without gallop, murmur, click, rub or other extra sounds. Abdomen:  Bowel sounds positive,abdomen soft and non-tender without masses, organomegaly or hernias noted. Msk:  wnl Neurologic:  alert & oriented X3.   Skin:  Intact without suspicious lesions or rashes Psych:  Sad   Impression & Recommendations:  Problem # 1:  UPPER RESPIRATORY INFECTION, ACUTE (ICD-465.9) Assessment New Zpac Her updated medication list for this problem includes:    Aspirin 325 Mg Tabs (Aspirin) .[name]... 1 tab once daily    Tessalon Perles 100 Mg Caps (Benzonatate) .[name]... 1-2 by mouth two times a day as needed cogh    Promethazine-codeine 6.25-10 Mg/75ml Syrp (Promethazine-codeine) .[name]... 5-10 ml by mouth q id as needed cough  Problem # 2:  CONJUNCTIVITIS, ACUTE (ICD-372.00) B Assessment: New  Her updated medication list for this problem includes:    Erythromycin 5 Mg/gm Oint (Erythromycin) ..... In affected eye(s)  two times a day  Problem # 3:  PALPITATIONS (ICD-785.1) Assessment: Improved  Her updated medication list for this problem includes:    Atenolol 50 Mg Tabs (Atenolol) .[name]... 1/2 tab once  daily  Problem # 4:  FIBRILLATION, ATRIAL (ICD-427.31) Assessment: Improved  Her updated medication list for this problem includes:    Atenolol 50 Mg Tabs (Atenolol) .[name]... 1/2 tab once daily    Aspirin 325 Mg Tabs (Aspirin) .[name]... 1 tab once daily    Diltiazem Hcl Er Beads 180 Mg Xr24h-cap (Diltiazem hcl er beads) .[name]... 1 tab qd  Complete Medication List: 1)  Atenolol 50 Mg Tabs (Atenolol) .... 1/2 tab once daily 2)  Vitamin D3 1000 Unit Tabs (Cholecalciferol) .[name].. 1 qd 3)  Aspirin 325 Mg Tabs (Aspirin) .[name].. 1 tab once  daily 4)  Diltiazem Hcl Er Beads 180 Mg Xr24h-cap (Diltiazem hcl er beads) .[name].. 1 tab qd 5)  Zoloft 50 Mg Tabs (Sertraline hcl) .[name].. 1 by mouth daily 6)  Premarin 0.3 Mg Tabs (Estrogens conjugated) .[name].. 1 tab once daily 7)  Medroxyprogesterone Acetate 10 Mg Tabs (Medroxyprogesterone acetate) .[name].. 10 days a month 8)  Transderm-scop 1.5 Mg Pt72 (Scopolamine base) .... Change q 3 days 9)  Zithromax Z-pak 250 Mg Tabs (Azithromycin) .... As dirrected 10)  Tessalon Perles 100 Mg Caps (Benzonatate) .[name].. 1-2 by mouth two times a day as needed cogh 11)  Erythromycin 5 Mg/gm Oint (Erythromycin) .... In affected eye(s)  two times a day 12)  Promethazine-codeine 6.25-10 Mg/56ml Syrp (Promethazine-codeine) .... 5-10 ml by mouth q id as needed cough  Patient Instructions: 1)  Use over-the-counter medicines for "cold": Tylenol  650mg  or Advil 400mg  every 6 hours  for fever; Delsym or Robutussin for cough. Mucinex  congestion. Ricola or Halls for sore throat. Office visit if not better or if worse.  Prescriptions: ZOLOFT 50 MG TABS (SERTRALINE HCL) 1 by mouth daily  #90 x 3   Entered and Authorized by:   Tresa Garter MD   Signed by:   Tresa Garter MD on 10/18/2010   Method used:   Print then Give to Patient   RxID:   [id number] ATENOLOL 50 MG  TABS (ATENOLOL) 1/2 tab once daily  #45 x 3   Entered and Authorized by:   Tresa Garter MD   Signed by:   Tresa Garter MD on 10/18/2010   Method used:   Print then Give to Patient   RxID:   [id number] PROMETHAZINE-CODEINE 6.25-10 MG/5ML SYRP (PROMETHAZINE-CODEINE) 5-10 ml by mouth q id as needed cough  #300 ml x 0   Entered and Authorized by:   Tresa Garter MD   Signed by:   Tresa Garter MD on 10/18/2010   Method used:   Print then Give to Patient   RxID:   [id number] ERYTHROMYCIN 5 MG/GM OINT (ERYTHROMYCIN) in affected eye(s)  two times a day  #1 tube x 0   Entered and Authorized by:   Tresa Garter MD   Signed by:   Tresa Garter MD on 10/18/2010   Method used:   Electronically to        [facility] # [phone]* (retail)       [address]       [place], [place]  [address]       Ph: [phone]       Fax: [phone]   RxID:   [id number] TESSALON PERLES 100 MG CAPS (BENZONATATE) 1-2 by mouth two times a day as needed cogh  #120 x 1   Entered and Authorized by:   Tresa Garter MD   Signed by:   Tresa Garter MD  on 10/18/2010   Method used:   Electronically to        [facility] [address] # [phone]* (retail)       [address]       [place], [place]  [address]       Ph: [phone]       Fax: [phone]   RxID:   [id number] ZITHROMAX Z-PAK 250 MG TABS (AZITHROMYCIN) as dirrected  #1 x 0   Entered and Authorized by:   Tresa Garter MD   Signed by:   Tresa Garter MD on 10/18/2010   Method used:   Electronically to        [facility] # [phone]* (retail)       [address]       [place], [place]  [address]       Ph: [phone]       Fax: [phone]   RxID:   [id number]    Orders Added: 1)  Est. Patient Level IV [42706]

## 2010-11-16 NOTE — Progress Notes (Signed)
  Phone Note Call from Patient Call back at Home Phone [phone]   Caller: [phone] Call For: Tresa Garter MD Summary of Call: Pt wants to know if she is still contagious? She was seen yesterday in the office. Initial call taken by: Verdell Face,  October 19, 2010 10:51 AM  Follow-up for Phone Call        Yes, but less than before. Usually (like with Strep throat) we stop being contagious 24 h after the first dose of the antibiotic  Follow-up by: Tresa Garter MD,  October 19, 2010 12:47 PM  Additional Follow-up for Phone Call Additional follow up Details #1::        pt informed  Additional Follow-up by: Lanier Prude, [facility]),  October 19, 2010 2:29 PM

## 2010-11-29 ENCOUNTER — Telehealth: Payer: Self-pay | Admitting: Internal Medicine

## 2010-11-30 ENCOUNTER — Ambulatory Visit (INDEPENDENT_AMBULATORY_CARE_PROVIDER_SITE_OTHER): Payer: Managed Care, Other (non HMO) | Admitting: Gynecology

## 2010-11-30 DIAGNOSIS — B373 Candidiasis of vulva and vagina: Secondary | ICD-10-CM

## 2010-11-30 DIAGNOSIS — B3731 Acute candidiasis of vulva and vagina: Secondary | ICD-10-CM

## 2010-11-30 DIAGNOSIS — N898 Other specified noninflammatory disorders of vagina: Secondary | ICD-10-CM

## 2010-12-05 ENCOUNTER — Ambulatory Visit (INDEPENDENT_AMBULATORY_CARE_PROVIDER_SITE_OTHER): Payer: Managed Care, Other (non HMO) | Admitting: Internal Medicine

## 2010-12-05 ENCOUNTER — Encounter: Payer: Self-pay | Admitting: Internal Medicine

## 2010-12-05 DIAGNOSIS — J209 Acute bronchitis, unspecified: Secondary | ICD-10-CM

## 2010-12-12 NOTE — Progress Notes (Signed)
Summary: LAB ORDER  Phone Note Call from Patient   Caller: Patient----[phone] Call For: Tresa Garter MD Summary of Call: pt requests lab order to go to [facility] and a referral for a colonoscopy, pt needs hard copy of lab order Initial call taken by: Verdell Face,  November 29, 2010 8:24 AM  Follow-up for Phone Call        Patient is requesting referral to [facility] - never had colon before. Also needs written lab order to pick up. She is aware that MD is out until next week.  Follow-up by: Lamar Sprinkles, CMA,  November 29, 2010 9:45 AM  Additional Follow-up for Phone Call Additional follow up Details #1::        OK. What is lab orders for [facility]? Additional Follow-up by: Tresa Garter MD,  November 30, 2010 9:37 AM    Additional Follow-up for Phone Call Additional follow up Details #2::    Let me know what labs you want to order. Will do written order to pt to pick up and bring to outside lab...............[name] KitchenLamar Sprinkles, CMA  November 30, 2010 3:59 PM   [facility] What labs? Georgina Quint Vaniah Chambers MD  December 03, 2010 12:32 PM   What labs are pt due for? She needs written order to take to outside lab due to insurance...............[name] KitchenLamar Sprinkles, CMA  December 04, 2010 9:42 AM   Additional Follow-up for Phone Call Additional follow up Details #3:: Details for Additional Follow-up Action Taken: Will order CBC, TSH, BMET, Hepatic panel, UA, Lipids, Dx: V70.0 Thank you!  Georgina Quint Latesa Fratto MD  December 04, 2010 9:26 PM   Order pending signature................[name] KitchenLamar Sprinkles, CMA  December 05, 2010 3:11 PM    ready, Pt informed   Additional Follow-up by: Lamar Sprinkles, CMA,  December 05, 2010 5:56 PM

## 2010-12-12 NOTE — Assessment & Plan Note (Signed)
Summary: COUGH / HEAD COLD /NWS   Vital Signs:  Patient profile:   65 year old female Height:      66 inches Weight:      207.25 pounds BMI:     33.57 O2 Sat:      99 % on Room air Temp:     98.5 degrees F oral Pulse rate:   79 / minute BP sitting:   120 / 70  (left arm) Cuff size:   large  Vitals Entered By: Zella Ball Ewing CMA (AAMA) (December 05, 2010 4:10 PM)  O2 Flow:  Room air CC: Cough and sinus drainage/RE   Primary Care Provider:  Tresa Garter MD  CC:  Cough and sinus drainage/RE.  History of Present Illness: here with acute onset midl to mod 3-5  days fever, ,mild ST, HA, general weakness and malaise;  and gradually increased prod cough, greenish sputum but Pt denies CP, worsening sob, doe, wheezing, orthopnea, pnd, worsening LE edema, palps, dizziness or syncope  Pt denies new neuro symptoms such as headache, facial or extremity weakness  Pt denies polydipsia, polyuria   Overall good compliance with meds, trying to follow low chol diet, wt stable, little excercise however  No recent wt loss, night sweats, loss of appetite or other constitutional symptoms .  Did also have a URI approx 4 wks ago that improved but still with residual nonprod cough until more acute symtpoms above.    Preventive Screening-Counseling & Management      Drug Use:  no.    Problems Prior to Update: 1)  Bronchitis-acute  (ICD-466.0) 2)  Conjunctivitis, Acute  (ICD-372.00) 3)  Upper Respiratory Infection, Acute  (ICD-465.9) 4)  Renal Cyst  (ICD-593.2) 5)  Upper Respiratory Infection, Acute  (ICD-465.9) 6)  Shoulder Pain  (ICD-719.41) 7)  Tobacco Use, Quit  (ICD-V15.82) 8)  Hip Pain, Left  (ICD-719.45) 9)  Pelvic Pain, Left  (ICD-789.09) 10)  Laryngitis, Acute  (ICD-464.00) 11)  Unspecified Myalgia and Myositis  (ICD-729.1) 12)  Gastroenteritis Without Dehydration  (ICD-558.9) 13)  Headache  (ICD-784.0) 14)  Vision Impairment, Low Vision, One Eye-left  (ICD-369.70) 15)  Blepharitis   (ICD-373.00) 16)  Sweating  (ICD-780.8) 17)  Syncope  (ICD-780.2) 18)  Pap Smear, Abnormal  (ICD-795.00) 19)  Cough  (ICD-786.2) 20)  Otitis Media, Acute, Left  (ICD-382.9) 21)  Knee Pain, Right, Acute  (ICD-719.46) 22)  Allergic Rhinitis  (ICD-477.9) 23)  Atrial Fibrillation  (ICD-427.31) 24)  Migraines, Hx of  (ICD-V13.8) 25)  Depression  (ICD-311) 26)  Palpitations  (ICD-785.1) 27)  Fibrillation, Atrial  (ICD-427.31) 28)  Routine General Medical Exam@health  Care [facility]  (ICD-V70.0) 29)  Family History Breast Cancer 1st Degree Relative <50  (ICD-V16.3)  Medications Prior to Update: 1)  Atenolol 50 Mg  Tabs (Atenolol) .... 1/2 Tab Once Daily 2)  Vitamin D3 1000 Unit  Tabs (Cholecalciferol) .[name].. 1 Qd 3)  Aspirin 325 Mg Tabs (Aspirin) .[name].. 1 Tab Once Daily 4)  Diltiazem Hcl Er Beads 180 Mg Xr24h-Cap (Diltiazem Hcl Er Beads) .[name].. 1 Tab Qd 5)  Zoloft 50 Mg Tabs (Sertraline Hcl) .[name].. 1 By Mouth Daily 6)  Premarin 0.3 Mg Tabs (Estrogens Conjugated) .[name].. 1 Tab Once Daily 7)  Medroxyprogesterone Acetate 10 Mg Tabs (Medroxyprogesterone Acetate) .[name].. 10 Days A Month 8)  Transderm-Scop 1.5 Mg Pt72 (Scopolamine Base) .... Change Q 3 Days 9)  Tessalon Perles 100 Mg Caps (Benzonatate) .[name].. 1-2 By Mouth Two Times A Day As Needed Cogh 10)  Promethazine-Codeine 6.25-10 Mg/12ml Syrp (Promethazine-Codeine) .... 5-10 Ml By Mouth Q Id As Needed Cough  Current Medications (verified): 1)  Atenolol 50 Mg  Tabs (Atenolol) .... 1/2 Tab Once Daily 2)  Vitamin D3 1000 Unit  Tabs (Cholecalciferol) .[name].. 1 Qd 3)  Aspirin 325 Mg Tabs (Aspirin) .[name].. 1 Tab Once Daily 4)  Diltiazem Hcl Er Beads 180 Mg Xr24h-Cap (Diltiazem Hcl Er Beads) .[name].. 1 Tab Qd 5)  Zoloft 50 Mg Tabs (Sertraline Hcl) .[name].. 1 By Mouth Daily 6)  Premarin 0.3 Mg Tabs (Estrogens Conjugated) .[name].. 1 Tab Once Daily 7)  Medroxyprogesterone Acetate 10 Mg Tabs (Medroxyprogesterone Acetate) .[name].. 10 Days A Month 8)  Transderm-Scop 1.5 Mg Pt72 (Scopolamine  Base) .... Change Q 3 Days 9)  Tessalon Perles 100 Mg Caps (Benzonatate) .[name].. 1-2 By Mouth Two Times A Day As Needed Cogh 10)  Tussionex Pennkinetic Er 10-8 Mg/59ml Lqcr (Hydrocod Polst-Chlorphen Polst) .... Generic -= 1 Tsp By Mouth Two Times A Day As Needed Cough 11)  Azithromycin 250 Mg Tabs (Azithromycin) .... 2po Qd For 1 Day, Then 1po Qd For 4days, Then Stop  Allergies (verified): 1)  ! Pcn  Past History:  Past Medical History: Last updated: 11/30/2008 Atrial fibrillation, h/o last episode 8/09 Dr Daleen Squibb Depression Allergic rhinitis Palpitations migraines obesity Hypertension palpatations  Past Surgical History: Last updated: 07/28/2007 Caesarean section  Social History: Last updated: 12/05/2010 Former Smoker Alcohol use-yes Drug use-no  Risk Factors: Smoking Status: quit (11/11/2009)  Social History: Former Smoker Alcohol use-yes Drug use-no Drug Use:  no  Review of Systems       all otherwise negative per pt -    Physical Exam  General:  alert and overweight-appearing., mild ill  Head:  normocephalic and atraumatic.   Eyes:  vision grossly intact, pupils equal, and pupils round.   Ears:  bilat tm's mod red, sinus nontender Nose:  nasal dischargemucosal pallor and mucosal edema.   Mouth:  pharyngeal erythema and fair dentition.   Neck:  supple and cervical lymphadenopathy.   Lungs:  normal respiratory effort and normal breath sounds without wheezing or rales.   Heart:  normal rate and regular rhythm.   Extremities:  no edema, no erythema    Impression & Recommendations:  Problem # 1:  BRONCHITIS-ACUTE (ICD-466.0)  Her updated medication list for this problem includes:    Tessalon Perles 100 Mg Caps (Benzonatate) .[name]... 1-2 by mouth two times a day as needed cogh    Tussionex Pennkinetic Er 10-8 Mg/70ml Lqcr (Hydrocod polst-chlorphen polst) .[name]... Generic -= 1 tsp by mouth two times a day as needed cough    Azithromycin 250 Mg Tabs (Azithromycin) .[name]...  2po qd for 1 day, then 1po qd for 4days, then stop treat as above, f/u any worsening signs or symptoms   Complete Medication List: 1)  Atenolol 50 Mg Tabs (Atenolol) .... 1/2 tab once daily 2)  Vitamin D3 1000 Unit Tabs (Cholecalciferol) .[name].. 1 qd 3)  Aspirin 325 Mg Tabs (Aspirin) .[name].. 1 tab once daily 4)  Diltiazem Hcl Er Beads 180 Mg Xr24h-cap (Diltiazem hcl er beads) .[name].. 1 tab qd 5)  Zoloft 50 Mg Tabs (Sertraline hcl) .[name].. 1 by mouth daily 6)  Premarin 0.3 Mg Tabs (Estrogens conjugated) .[name].. 1 tab once daily 7)  Medroxyprogesterone Acetate 10 Mg Tabs (Medroxyprogesterone acetate) .[name].. 10 days a month 8)  Transderm-scop 1.5 Mg Pt72 (Scopolamine base) .... Change q 3 days 9)  Tessalon Perles 100 Mg Caps (Benzonatate) .[name].. 1-2 by mouth  two times a day as needed cogh 10)  Tussionex Pennkinetic Er 10-8 Mg/49ml Lqcr (Hydrocod polst-chlorphen polst) .... Generic -= 1 tsp by mouth two times a day as needed cough 11)  Azithromycin 250 Mg Tabs (Azithromycin) .... 2po qd for 1 day, then 1po qd for 4days, then stop  Patient Instructions: 1)  Please take all new medications as prescribed 2)  Continue all previous medications as before this visit  3)  Please schedule an appointment with your primary doctor as needed Prescriptions: TUSSIONEX PENNKINETIC ER 10-8 MG/5ML LQCR (HYDROCOD POLST-CHLORPHEN POLST) generic -= 1 tsp by mouth two times a day as needed cough  #6oz x 1   Entered and Authorized by:   Corwin Levins MD   Signed by:   Corwin Levins MD on 12/05/2010   Method used:   Print then Give to Patient   RxID:   [id number] AZITHROMYCIN 250 MG TABS (AZITHROMYCIN) 2po qd for 1 day, then 1po qd for 4days, then stop  #6 x 1   Entered and Authorized by:   Corwin Levins MD   Signed by:   Corwin Levins MD on 12/05/2010   Method used:   Print then Give to Patient   RxID:   [id number]    Orders Added: 1)  Est. Patient Level III [40347]

## 2010-12-20 ENCOUNTER — Encounter: Payer: Self-pay | Admitting: Internal Medicine

## 2010-12-21 ENCOUNTER — Encounter (INDEPENDENT_AMBULATORY_CARE_PROVIDER_SITE_OTHER): Payer: Self-pay | Admitting: *Deleted

## 2010-12-25 ENCOUNTER — Other Ambulatory Visit (HOSPITAL_COMMUNITY)
Admission: RE | Admit: 2010-12-25 | Discharge: 2010-12-25 | Disposition: A | Discharge status: Home / Self Care | Payer: Managed Care, Other (non HMO) | Source: Ambulatory Visit | Attending: Pediatrics | Admitting: Pediatrics

## 2010-12-25 ENCOUNTER — Other Ambulatory Visit: Payer: Self-pay | Admitting: Gynecology

## 2010-12-25 ENCOUNTER — Encounter (INDEPENDENT_AMBULATORY_CARE_PROVIDER_SITE_OTHER): Payer: Managed Care, Other (non HMO) | Admitting: Gynecology

## 2010-12-25 DIAGNOSIS — Z1231 Encounter for screening mammogram for malignant neoplasm of breast: Secondary | ICD-10-CM

## 2010-12-25 DIAGNOSIS — Z01419 Encounter for gynecological examination (general) (routine) without abnormal findings: Secondary | ICD-10-CM

## 2010-12-25 DIAGNOSIS — Z124 Encounter for screening for malignant neoplasm of cervix: Secondary | ICD-10-CM | POA: Insufficient documentation

## 2010-12-25 DIAGNOSIS — Z1211 Encounter for screening for malignant neoplasm of colon: Secondary | ICD-10-CM

## 2010-12-26 NOTE — Letter (Signed)
Summary: Pre Visit Letter Revised  [facility]  [address] [place], [place] [address]   Phone: [phone]  Fax: [phone]        12/21/2010 MRN: [id number] Amanda Wilkins [address] [place], [place]  [address]  [place]             Procedure Date:  01-29-11           Direct Colon---Dr. Juanda Chance   Welcome to the Gastroenterology Division at [facility].    You are scheduled to see a nurse for your pre-procedure visit on 01-08-11 at 11:00a.m. on the 3rd floor at [facility], [address]  We ask that you try to arrive at our office 15 minutes prior to your appointment time to allow for check-in.  Please take a minute to review the attached form.  If you answer "Yes" to one or more of the questions on the first page, we ask that you call the person listed at your earliest opportunity.  If you answer "No" to all of the questions, please complete the rest of the form and bring it to your appointment.    Your nurse visit will consist of discussing your medical and surgical history, your immediate family medical history, and your medications.   If you are unable to list all of your medications on the form, please bring the medication bottles to your appointment and we will list them.  We will need to be aware of both prescribed and over the counter drugs.  We will need to know exact dosage information as well.    Please be prepared to read and sign documents such as consent forms, a financial agreement, and acknowledgement forms.  If necessary, and with your consent, a friend or relative is welcome to sit-in on the nurse visit with you.  Please bring your insurance card so that we may make a copy of it.  If your insurance requires a referral to see a specialist, please bring your referral form from your primary care physician.  No co-pay is required for this nurse visit.     If you cannot keep your appointment, please call [phone] to cancel or  reschedule prior to your appointment date.  This allows [place] the opportunity to schedule an appointment for another patient in need of care.    Thank you for choosing  Gastroenterology for your medical needs.  We appreciate the opportunity to care for you.  Please visit [place] at our website  to learn more about our practice.  Sincerely, The Gastroenterology Division

## 2010-12-29 ENCOUNTER — Telehealth: Payer: Self-pay | Admitting: Internal Medicine

## 2011-01-02 ENCOUNTER — Telehealth: Payer: Self-pay | Admitting: Internal Medicine

## 2011-01-02 NOTE — Telephone Encounter (Signed)
We can give her MMR booster to help with protection from Measles She can have a shot for shingles, better later this week or next wk. Vesicle fluid only is contagious for shingles.

## 2011-01-02 NOTE — Progress Notes (Signed)
Summary: results request  Phone Note Call from Patient Call back at Home Phone [phone]   Caller: Patient (MB) [phone] Reason for Call: Lab or Test Results Summary of Call: Pt. requesting lab results ([facility]). Please see results scanned into EMR 12/20/2010. Initial call taken by: Burnard Leigh [facility]),  December 29, 2010 12:40 PM  Follow-up for Phone Call        Labs are good. Please, mail a copy of all test results from this date  to the patient.   Follow-up by: Tresa Garter MD,  December 29, 2010 1:59 PM  Additional Follow-up for Phone Call Additional follow up Details #1::        left vm for pt, mailed letter Additional Follow-up by: Lamar Sprinkles, CMA,  December 29, 2010 3:20 PM

## 2011-01-02 NOTE — Telephone Encounter (Signed)
Pt states that she has come in contact w/someone who has shingles; she would like to know if she should get the vaccine now. She is going to [place] this summer and has been told that there is an epidemic outbreak of measles there and that she could catch shingles from children w/measles.?? Please Advise if you would like OV to qualm her fears and review her immunizations.?

## 2011-01-03 NOTE — Telephone Encounter (Signed)
Informed Pt of Dr Plotnikov's advice. She is going to make an appt for a nurse's visit for herself and her husband for immunization injections.

## 2011-01-08 ENCOUNTER — Ambulatory Visit (AMBULATORY_SURGERY_CENTER): Payer: Managed Care, Other (non HMO) | Admitting: *Deleted

## 2011-01-08 VITALS — Ht 66.0 in | Wt 210.7 lb

## 2011-01-08 DIAGNOSIS — Z139 Encounter for screening, unspecified: Secondary | ICD-10-CM

## 2011-01-08 MED ORDER — PEG-KCL-NACL-NASULF-NA ASC-C 100 G PO SOLR: 1.0000 | Freq: Once | ORAL | Status: AC

## 2011-01-08 NOTE — Patient Instructions (Signed)
Pt given verbal and pre-printed instructions about Moviprep

## 2011-01-10 ENCOUNTER — Ambulatory Visit: Payer: Managed Care, Other (non HMO)

## 2011-01-11 ENCOUNTER — Ambulatory Visit
Admission: RE | Admit: 2011-01-11 | Discharge: 2011-01-11 | Disposition: A | Discharge status: Home / Self Care | Payer: Managed Care, Other (non HMO) | Source: Ambulatory Visit | Attending: Gynecology | Admitting: Gynecology

## 2011-01-11 DIAGNOSIS — Z1231 Encounter for screening mammogram for malignant neoplasm of breast: Secondary | ICD-10-CM

## 2011-01-24 ENCOUNTER — Ambulatory Visit (INDEPENDENT_AMBULATORY_CARE_PROVIDER_SITE_OTHER): Payer: Managed Care, Other (non HMO) | Admitting: *Deleted

## 2011-01-24 DIAGNOSIS — Z23 Encounter for immunization: Secondary | ICD-10-CM

## 2011-01-26 ENCOUNTER — Encounter: Payer: Self-pay | Admitting: Internal Medicine

## 2011-01-29 ENCOUNTER — Ambulatory Visit (AMBULATORY_SURGERY_CENTER): Payer: Managed Care, Other (non HMO) | Admitting: Internal Medicine

## 2011-01-29 ENCOUNTER — Encounter: Payer: Self-pay | Admitting: Internal Medicine

## 2011-01-29 VITALS — HR 82 | Temp 98.6°F | Resp 19 | Ht 66.0 in | Wt 205.0 lb

## 2011-01-29 DIAGNOSIS — Z1211 Encounter for screening for malignant neoplasm of colon: Secondary | ICD-10-CM

## 2011-01-29 MED ORDER — SODIUM CHLORIDE 0.9 % IV SOLN: 500.0000 mL | INTRAVENOUS | Status: DC

## 2011-01-30 ENCOUNTER — Telehealth: Payer: Self-pay | Admitting: *Deleted

## 2011-01-30 NOTE — Telephone Encounter (Signed)

## 2011-02-05 ENCOUNTER — Encounter: Payer: Self-pay | Admitting: Internal Medicine

## 2011-02-05 ENCOUNTER — Ambulatory Visit (INDEPENDENT_AMBULATORY_CARE_PROVIDER_SITE_OTHER): Payer: Managed Care, Other (non HMO) | Admitting: Internal Medicine

## 2011-02-05 DIAGNOSIS — L659 Nonscarring hair loss, unspecified: Secondary | ICD-10-CM

## 2011-02-05 NOTE — Patient Instructions (Signed)
Try Rogain for women

## 2011-02-05 NOTE — Progress Notes (Signed)
  Subjective:    Patient ID: Amanda Wilkins, female    DOB: 1946/02/16, 65 y.o.   MRN: [id number]  HPI  C/o loosing hair after she went off HRT  Review of Systems  Constitutional: Negative for fever, activity change and unexpected weight change.  Cardiovascular: Negative for leg swelling.  Skin: Negative for rash.  Neurological: Negative for numbness.  Psychiatric/Behavioral: The patient is not nervous/anxious.        Objective:   Physical Exam  Constitutional: She appears well-developed and well-nourished. No distress.  HENT:  Head: Normocephalic.  Right Ear: External ear normal.  Left Ear: External ear normal.  Nose: Nose normal.  Mouth/Throat: Oropharynx is clear and moist.  Eyes: Conjunctivae are normal. Pupils are equal, round, and reactive to light. Right eye exhibits no discharge. Left eye exhibits no discharge.  Neck: Normal range of motion. Neck supple. No JVD present. No tracheal deviation present. No thyromegaly present.  Cardiovascular: Normal rate, regular rhythm and normal heart sounds.   Pulmonary/Chest: No stridor. No respiratory distress. She has no wheezes.  Abdominal: Soft. Bowel sounds are normal. She exhibits no distension and no mass. There is no tenderness. There is no rebound and no guarding.  Musculoskeletal: She exhibits no edema and no tenderness.  Lymphadenopathy:    She has no cervical adenopathy.  Neurological: She displays normal reflexes. No cranial nerve deficit. She exhibits normal muscle tone. Coordination normal.  Skin: No rash noted. No erythema.       Alopecia - temporal receeding  Psychiatric: She has a normal mood and affect. Her behavior is normal. Judgment and thought content normal.          Assessment & Plan:   Alopecia I do not think her meds are contributing. We discussed options available.We will start with Rogain for women. If no effect, we will see a dermatologist to see if steroid shots would help. Another option would  be a hair transplant procedure.  Total time over 20 mins > 50% spent counseling patient with regards to the problems above, coordination of care and treatment options.

## 2011-02-11 ENCOUNTER — Encounter: Payer: Self-pay | Admitting: Internal Medicine

## 2011-02-11 DIAGNOSIS — L659 Nonscarring hair loss, unspecified: Secondary | ICD-10-CM | POA: Insufficient documentation

## 2011-02-11 NOTE — Assessment & Plan Note (Signed)
I do not think her meds are contributing. We discussed options available.We will start with Rogain for women. If no effect, we will see a dermatologist to see if steroid shots would help. Another option would be a hair transplant procedure.  Total time over 20 mins > 50% spent counseling patient with regards to the problems above, coordination of care and treatment options.

## 2011-02-27 NOTE — Assessment & Plan Note (Signed)
[facility]                            CARDIOLOGY OFFICE NOTE   TAIRA, KNABE                   MRN:          [id number]  DATE:09/29/2008                            DOB:          01-02-1946    [name] returns today after being admitted to the hospital for  paroxysmal atrial fibrillation with rapid ventricular rate.  She was  very symptomatic.   She converted with IV Cardizem.   She has no underlying structural heart disease, but does have a history  of hypertension.  Her most recent 2D echocardiogram was June 04, 2008,  which was essentially normal.  She does have some mild mitral  regurgitation directed posteriorly in the left atrium, though her left  atrial size is normal.   We placed her on diltiazem 180 mg a day in addition to her atenolol 50  mg a day which she was already taking.  We upped her aspirin from 81-325  mg per day.   PHYSICAL EXAMINATION:  VITAL SIGNS:  Her blood pressure today is 110/70,  her pulse is 65 and regular, weight is 196.  GENERAL:  She is in no acute distress.  HEENT:  Normal.  NECK:  Carotids were full without bruits.  Thyroid is not enlarged.  Trachea is midline.  LUNGS:  Clear to auscultation and percussion.  HEART:  Poorly appreciated PMI.  Soft S1 and S2.  Regular rate and  rhythm.  No murmur.  ABDOMEN:  Soft, good bowel sounds.  EXTREMITIES:  No edema.  Pulses are intact.  NEUROLOGIC:  Intact.   Electrocardiogram today is essentially normal.   I had a nice talk with [name].  Hopefully, the combination of  diltiazem and atenolol will keep her in normal rhythm.  Certainly, it  will hopefully it will make it go slower and be less symptomatic when  she has events.  Until she has recurrent paroxysmal atrial fibrillation  on these two medications, we will not go to a specific antiarrhythmic.  If that becomes necessary, she will need a stress Myoview to rule out  any underlying coronary  ischemia and then we could choose a type 1C  drug.  If that fails, we have also talked about the possibilities of  atrial fibrillation ablation with Dr. Johney Frame.   I will plan on seeing her back in 3 months.     Thomas C. Daleen Squibb, MD, [facility]  Electronically Signed   TCW/MedQ  DD: 09/29/2008  DT: 09/30/2008  Job #: [id number]

## 2011-02-27 NOTE — Assessment & Plan Note (Signed)
[facility]                            CARDIOLOGY OFFICE NOTE   [name], [name]                   MRN:          [id number]  DATE:06/08/2008                            DOB:          Aug 06, 1946    [name] returns today after being discharged from the hospital  with an episode of atrial fib with a rapid ventricular rate.  This  occurred when she was lying down.   Her evaluation in the hospital was unremarkable including cardiac  markers and TSH.  Potassium was 3.9.  She converted to sinus rhythm.  Echocardiogram showed EF of 60-70% with mild mitral regurgitation.   She has been extremely anxious and nervous since then.  She has not  resumed her exercise routine and has not even swam at her pool.  She is  afraid to travel.   She was using a fair amount of caffeine, which she has cut back.  She  had a lot of recent social stress, which is lessening.   She is also concerned about having a stroke.  Her [place] score is 0 and so  aspirin was felt to be adequate at 325 a day.  She also takes atenolol  50 mg a day.   PHYSICAL EXAMINATION:  VITAL SIGNS:  Blood pressure 118/80.  Her pulse  is 71 and regular.  Her weight is 195.  HEENT:  Unchanged.  NECK:  Carotids upstrokes are equal bilaterally without any bruits.  No  JVD.  Thyroid is not enlarged.  Trachea is midline.  LUNGS:  Clear.  HEART:  Normal S1 and S2.  ABDOMEN:  Soft.  EXTREMITIES:  There is no edema.  Pulses are intact.  NEURO:  Intact.   EKG today shows sinus rhythm with a low voltage.   ASSESSMENT AND PLAN:  I had a long talk with Mrs. Amanda Wilkins today.  I  have answered all of her questions and her numerous concerns and  anxieties.  I have advised to get back into a regular exercise routine,  swim on a regular basis to stay active and put this behind her if she  can.  I will see her back in about 4-6 weeks before she goes out of the  country for a couple weeks.     Thomas C. Daleen Squibb, MD, [facility]  Electronically Signed    TCW/MedQ  DD: 06/08/2008  DT: 06/09/2008  Job #: [id number]

## 2011-02-27 NOTE — Assessment & Plan Note (Signed)
[facility]                                 ON-CALL NOTE   Amanda Wilkins, Amanda Wilkins                   MRN:          [id number]  DATE:02/05/2009                            DOB:          17-Mar-1946    This is a callback.  We had a wrong number before, called back on [phone], I was able to reach her this time.   The patient said that she just came back from [place].  She was delayed  there because of the [place] and is in [place], [place], now trying to  get home.  She said she was nauseated on the plane and started with  diarrhea.  She has had severe diarrhea all night with watery stools.  She has been able to get 2 bottles of water down.  She does not feel  dizzy or dehydrated, but she wants to know what to do.  I advised her  that I do not generally recommend diarrhea medicine over-the-counter  until we know what is causing the diarrhea, that she may have a virus or  perhaps food poisoning, then I instructed her to go to an urgent care  there in the [place] area and get checked out, this is what she is going to  do.     Marne A. Tower, MD  Electronically Signed    MAT/MedQ  DD: 02/05/2009  DT: 02/06/2009  Job #: [id number]

## 2011-02-27 NOTE — Assessment & Plan Note (Signed)
[facility]                                 ON-CALL NOTE   Amanda Wilkins, Amanda Wilkins                   MRN:          [id number]  DATE:02/05/2009                            DOB:          [birth year]    PHONE NUMBER:  [phone].   CHIEF COMPLAINT:  Diarrhea.   Regular doctor is Dr. Posey Rea and Dr. Milinda Antis on call.   The patient called with the complaint of diarrhea.  I tried to call her  back several times.  When I called her number I got series of beeps, so  this was pager or fax.  I called the answering service, verified this  number and called back several times.  I did call and leave my phone  number in case this is a pager and never heard back.     Marne A. Tower, MD  Electronically Signed    MAT/MedQ  DD: 02/05/2009  DT: 02/06/2009  Job #: [id number]

## 2011-02-27 NOTE — Assessment & Plan Note (Signed)
[facility]                            CARDIOLOGY OFFICE NOTE   CAMY, LEDER                   MRN:          [id number]  DATE:12/01/2008                            DOB:          Dec 19, 1945    [name] comes in today for followup of her history of tachy  palpitations and symptomatic paroxysmal atrial fibrillation.   Her biggest complaint today is that she feels pauses that are little  more dramatic since we started diltiazem extended release 180 mg a day.  This was started after she was readmitted with paroxysmal atrial  fibrillation with rapid rate on atenolol.   She remains on:  1. Atenolol 50 mg a day.  2. Aspirin 325 mg a day.   She is exercising on a regular basis.  She is trying to avoid caffeine.  She is very anxious, however, very afraid to even travel, particularly  abroad with this condition.  I have spend time before and time today  trying to explain this is usually not life-threatening and unless she is  in atrial fib, her risk of stroke is very low.   PHYSICAL EXAMINATION:  VITAL SIGNS:  Her blood pressure is 144/80, her  pulse is 72 and regular, and her weight is 195 and stable.  Respiratory  rate is 18.  GENERAL:  She is extremely anxious.  HEENT:  Normal.  NECK:  Supple.  Carotid upstrokes were equal bilaterally without bruits.  No JVD.  Thyroid is not enlarged.  Trachea is midline.  CHEST:  Lungs  are clear to auscultation and percussion.  HEART:  Normal PMI.  Normal S1 and S2.  ABDOMEN:  Obese.  Good bowel sounds.  No midline bruit.  No obvious  organomegaly.  EXTREMITIES:  No cyanosis, clubbing, or edema.  Pulses are brisk.  NEUROLOGIC:  Intact.   I had a long talk with [name].  I have tried to reassure again  that her risk is low, having any life-threatening event with these tachy  palpitations or with atrial fib.  Though her symptomatic pauses have  been worse on diltiazem, she would like to stay  on it.  We will continue  to do so.  I have made no changes today.  I have encouraged her to  continue exercising and to  travel at will.  I will see her back again 6  months.     Thomas C. Daleen Squibb, MD, [facility]  Electronically Signed    TCW/MedQ  DD: 12/01/2008  DT: 12/01/2008  Job #: [id number]

## 2011-02-27 NOTE — Assessment & Plan Note (Signed)
[facility]                            CARDIOLOGY OFFICE NOTE   [name], Wilkins                   MRN:          [id number]  DATE:02/26/2008                            DOB:          09-Dec-1945    I was asked by Dr. Sonda Primes to evaluate [name] with recent  palpitations or heart flip flops.   HISTORY OF PRESENT ILLNESS:  She is 65 years of age, married, mother of  two.  Over the last couple of months, she has had two episodes where she  had her heart flip flop for about 10 seconds.  Both times she was  driving the car and both time she had burping and upper GI gas.   She usually does not have a lot of reflux but has noticed that every  time she has these flip flops she does have eructations.   A couple years ago, she had a 2-D echocardiogram August 05, 2006 for  palpitations.  This was normal except for minimal left atrial  enlargement.   She denies orthopnea, PND, peripheral edema.  She has had no presyncope.  She has had no exercise intolerance, in fact she almost exercises daily.   PAST MEDICAL HISTORY:  She is intolerant to PENICILLIN and EPINEPHRINE.  She does not have a dye allergy.   CURRENT MEDICATIONS:  1. Atenolol 50 mg a day.  2. Activella 0.5 to 1 mg a day.  3. Enteric-coated aspirin 81 mg a day.  4. Doxycycline 100 mg a day.   She does not smoke. She does enjoy wine and in fact she drinks more than  two glassed a day it sounds like.   SURGICAL HISTORY:  She had one C section in the past.   FAMILY HISTORY:  Negative for premature coronary disease.  Her father  did have an MI at age 104.   SOCIAL HISTORY:  She does administrative work part-time.  She is married  and has two children.   REVIEW OF SYSTEMS:  Other than the HPI is really negative. All systems  were questioned and reviewed.   PHYSICAL EXAMINATION:  Her exam today, her blood pressure is 124/84,  pulse 68 and regular.  She is 5 feet 6 and weighs  197 pounds.  HEENT:  Normocephalic, atraumatic.  PERRLA.  Extraocular movements  intact.  Sclerae clear.  Face symmetry is normal.  Dentition is  satisfactory.  NECK:  Supple.  Carotid upstrokes are equal bilaterally without bruits,  no JVD.  Thyroid is not enlarged. Trachea is midline.  LUNGS:  Clear.  HEART:  Reveals a poorly appreciated PMI.  Normal S1, S2 without murmur,  rub or gallop.  ABDOMEN:  Soft with good bowel sounds.  She is moderately overweight.  Organomegaly cannot be assessed.  EXTREMITIES:  Reveal no edema.  Pulses are intact, there is no sign of  DVT.  MUSCULOSKELETAL:  Benign.   ELECTROCARDIOGRAM:  Normal sinus rhythm with normal PR, QRS and QTC  interval.   I have had a long talk with [name] today. I do not think she has  any significant structural or  organic heart disease  and I think these  palpitations are benign. I do not see any reason for further  cardiovascular workup at this time.   We reviewed symptoms of angina or ischemia.  We have also talked about  symptoms with more significant arrhythmias.   We will plan on seeing her back on a p.r.n. basis.     Thomas C. Daleen Squibb, MD, [facility]  Electronically Signed    TCW/MedQ  DD: 02/26/2008  DT: 02/26/2008  Job #: [id number]   cc:   Georgina Quint. Plotnikov, MD

## 2011-02-27 NOTE — Discharge Summary (Signed)
[name], [name]          ACCOUNT NO.:  [id number]   MEDICAL RECORD NO.:  [id number]          PATIENT TYPE:  INP   LOCATION:  6524                         FACILITY:  [facility]   PHYSICIAN:  Doylene Canning. Ladona Ridgel, MD    DATE OF BIRTH:  Aug 14, 1946   DATE OF ADMISSION:  06/03/2008  DATE OF DISCHARGE:  06/05/2008                         DISCHARGE SUMMARY - REFERRING   DISCHARGE DIAGNOSES:  1. Atrial fibrillation with a rapid ventricular rate.  2. Hypertension (on presentation, blood pressure was 225/82).  3. Obesity.  4. Recent social stressors and high caffeine intake.  5. History as previously.   PRIMARY CARDIOLOGIST:  Jesse Sans. Wall, MD, [facility].   DATE OF ADMISSION:  June 03, 2008, Amanda Wilkins.   DATE OF DISCHARGE:  June 05, 2008, Dr. Lewayne Bunting.   SUMMARY OF HISTORY:  [name] is a 65 year old white female who  presented to the emergency room with palpitations associated with  anxiety.  She was found to be in atrial fib with a rapid ventricular  rate.  She states that she began experiencing palpitations approximately  6 days prior to admission but they would resolve on their own.  This is  the first time she has had palpitations in several months.  She has a  remote history of atrial fibrillation in the 1990s that were self-  limiting and she has noted increased psychosocial stressors over the  preceding several days as well as increased caffeine intake.   LABORATORY DATA:  Chest x-ray on the 20th did not show any acute cardiac  abnormalities.  EKG on admission showed atrial fibrillation with a  ventricular rate of 170, no acute EKG changes.  At the time of  discharge, EKG showed normal sinus rhythm with a ventricular rate of 60.  Admission weight was 89.  Admission H and H was 14.3 and 42.3, normal  indices, platelets 208, WBCs 9.0.  PTT 24, PT 11.9, sodium 140,  potassium 3.9, BUN 11, creatinine 1.1, glucose 113.  CK-MB relative  index and troponin were within normal  limits x1, TSH was 1.853.   HOSPITAL COURSE:  [name] was admitted to 6500.  Over night she  remained in atrial fibrillation, however rate was now controlled on p.o.  Cardizem.  However, by the 22nd she had converted to normal sinus  rhythm.  Echocardiogram had been performed on the 21st that showed an EF  of 60-70% with mild MR directed posteriorly in the LA.  After review,  Dr. Ladona Ridgel felt that she could be discharged home and felt that she did  not need to be on Coumadin at this time.   DISCHARGE DISPOSITION:  The patient is discharged home on June 05, 2008.  She was asked to maintain a low-sodium, heart-healthy, diet.  She  was asked to avoid caffeine.  Activities are not restricted.  Wound care  is not applicable.  She is asked to increase her aspirin to 325 mg  daily, continue her atenolol 50 mg daily and her Activella 1.0/0.5  daily.  She was advised to begin a blood pressure and heart rate diary,  to bring all medications and  her diary  to all appointments.  Dr. Vern Claude office will call her with a 2-week  follow-up appointment.  She will follow up with Dr. Posey Rea as needed.   DISCHARGE TIME:  Twenty-five minutes.      Joellyn Rued, PA-C      Doylene Canning. Ladona Ridgel, MD  Electronically Signed    EW/MEDQ  D:  06/05/2008  T:  06/05/2008  Job:  [id number]   cc:   Thomas C. Wall, MD, [facility]

## 2011-02-27 NOTE — Discharge Summary (Signed)
Amanda Wilkins, Amanda Wilkins          ACCOUNT NO.:  [id number]   MEDICAL RECORD NO.:  [id number]          PATIENT TYPE:  INP   LOCATION:  3703                         FACILITY:  [facility]   PHYSICIAN:  Jesse Sans. Wall, MD, FACCDATE OF BIRTH:  [birth year]   DATE OF ADMISSION:  09/21/2008  DATE OF DISCHARGE:  09/22/2008                               DISCHARGE SUMMARY   DISCHARGE DIAGNOSIS:  Paroxysmal atrial fibrillation.   SECONDARY DIAGNOSES:  Hypertension and obesity.   ALLERGIES:  PENICILLIN and ASPIRIN.   PROCEDURES PERFORMED DURING THIS HOSPITALIZATION:  The patient had a  chest x-ray that showed a normal heart size, left basilar opacities,  lungs otherwise clear, no pneumothorax or effusion.  There were left  basilar atelectasis, worse airspace disease.  The patient had an  electrocardiogram performed on September 21, 2008, that showed atrial  fibrillation with a rapid ventricular response with a ventricular rate  of 119.  However, there were no significant ST or T-wave changes, no Q  waves.   HISTORY OF PRESENT ILLNESS:  The patient is a 65 year old female with a  history of paroxysmal atrial fibrillation who got up at 3 a.m. in the  morning on September 21, 2008, to go to the bathroom and drank a large  glass of cold water.  At that time, she had a sudden onset of  palpitations with no chest pain or shortness of breath, no presyncope or  syncope.  Per the patient, she was completely asymptomatic except that  she can feel that her heart was beating rapidly and in a strange rhythm.  She has had similar episodes in the past.  When this did not resolve,  she came to the [facility] Emergency Department in the morning.  In the  ER, her rate was recorded as 158.  She received an IV bolus of Cardizem  which reduced the rate somewhat, but she did not immediately convert  back to sinus rhythm.  She was admitted on IV Cardizem drip.   HOSPITAL COURSE:  After being admitted on IV Cardizem, she  continued on  her home medications as well and converted overnight back into a sinus  rhythm at a normal rate.  The patient was in sinus rhythm at the time of  discharge on September 22, 2008, and will follow up with Dr. Valera Castle,  her primary cardiologist, on September 29, 2008, at 11 a.m.   At the time of discharge, her temperature was 98.8 degrees Fahrenheit,  her pulse was 64, systolic BP 106, diastolic BP 64, respirations 18, and  O2 saturation 96% on room air.   LABORATORY DATA:  White blood cells 7.2, hemoglobin 13.9, hematocrit  40.8, and platelet count 202.  Magnesium 2.3.  TSH 1.554.   FOLLOWUP PLANS AND APPOINTMENTS:  The patient will be seen by Dr. Valera Castle on September 29, 2008, and was instructed to follow up with her  primary care physician at her earliest convenience.  The patient had no  questions or other concerns at the time of discharge.   DISCHARGE MEDICATIONS:  The patient will continue on her regular home  meds:  Atenolol 50 mg daily, Activia 81 mg daily, and calcium and  vitamin B12 supplements.  She will also be starting enteric-coated  aspirin at 325 mg daily and diltiazem 180 mg daily.   Duration of discharge encounter was 45 minutes including physician time.      Jarrett Ables, PAC      Thomas C. Daleen Squibb, MD, [facility]  Electronically Signed    MS/MEDQ  D:  09/22/2008  T:  09/23/2008  Job:  [id number]   cc:   Georgina Quint. Plotnikov, MD

## 2011-02-27 NOTE — H&P (Signed)
[name], [name]          ACCOUNT NO.:  [id number]   MEDICAL RECORD NO.:  [id number]          PATIENT TYPE:  EMS   LOCATION:  MAJO                         FACILITY:  [facility]   PHYSICIAN:  Thomas C. Wall, MD, FACCDATE OF BIRTH:  05/23/1946   DATE OF ADMISSION:  06/03/2008  DATE OF DISCHARGE:                              HISTORY & PHYSICAL   CHIEF COMPLAINT:  Palpitations.   HISTORY OF PRESENT ILLNESS:  [name] is a 65 year old white female  with a remote history of paroxysmal atrial fibrillation, presenting  today with new onset of palpitations.  The patient states that today  while lying in bed, she felt acute onset of rapid heartbeat and  palpitations.  There were no other associated symptoms other than some  anxiety associated with these symptoms.  She reported here to [facility]  ED where she was found to be in atrial fibrillation with rapid  ventricular response.  The patient states that approximately 6 days  prior to this admission, she also experienced palpitations that resolved  on their own.  Other than these two episodes, she has not felt  palpitations in several months' time.  The patient states that back in  the 1990s, she was diagnosed with atrial fibrillation, however, she  states she only had two brief episodes which were self-limiting.  The  patient states that she has had increased psychosocial stressors over  the past several days.  The patient also endorses a increased caffeine  intake above her baseline today.  The patient denies any chest  discomfort, shortness of breath, lower extremity edema, PND, or  orthopnea.  She also denies any syncope or dizzy episodes.   PAST MEDICAL HISTORY:  Remote history of atrial fibrillation.   FAMILY HISTORY:  Negative for premature coronary artery disease.   SOCIAL HISTORY:  No tobacco.  She drinks 2-3 glasses of wine daily.   ALLERGIES:  PENICILLIN and EPINEPHRINE.   MEDICATIONS:  1. Aspirin 81 mg daily.  2.  Atenolol 50 mg daily.  3. Activella 1.0/0.5 mg daily.   REVIEW OF SYSTEMS:  As in HPI.  All other systems were reviewed and are  negative.   PHYSICAL EXAMINATION:  VITAL SIGNS:  She has a temperature of 98.2, a  pulse of 160, respiratory rate 20, and blood pressure 150/80.  GENERAL:  She is in no acute distress.  HEENT:  Nonfocal.  Oropharynx is clear without lesions or exudate.  NECK:  Supple without JVD, lymphadenopathy, or thyromegaly.  HEART:  Irregularly irregular.  It is tachycardic without murmur, rub,  or gallop.  LUNGS:  Clear to auscultation bilaterally.  ABDOMEN:  Soft, nontender, and nondistended.  EXTREMITIES:  Without edema.  SKIN:  Warm and dry.  NEUROLOGIC:  Nonfocal.  PSYCHIATRIC:  The patient is appropriate with normal levels of insight.   LABORATORY DATA:  Sodium 140, potassium 3.9, chloride 110, CO2 22, BUN  11, creatinine 1.1, and glucose 113.  White count of 9, hemoglobin 14.6,  hematocrit 43, and platelet count 208.  Her INR is 0.9, PTT of 24, CK-MB  is less than 1.0, and troponin less than 0.05.  Chest x-ray is negative  for any acute cardiopulmonary process.  The patient states that she had  a stress thallium in 2002, which was negative.  (This is per the  patient's report.)  EKG reveals atrial fibrillation with rapid  ventricular response of approximately 170 beats per minute.   IMPRESSION:  The patient appears to have return of her paroxysmal atrial  fibrillation.  She does appear to be able to tell when she is in atrial  fibrillation.  The patient denies any history of hypertension, however,  she is hypertensive today in the emergency room.  If she indeed does not  normally have hypertension, she would have a CHADS score of one.   PLAN:  We will admit the patient to a telemetry unit and rate control  her initially with a bolus of IV Cardizem and then a drip.  We will also  initiate p.o. Cardizem in order to titrate the patient off the drip.  For the  time being, we will place her on Lovenox 1 mg/kg subcu q.12 h.  If the patient appears to normalize her blood pressure with a normal  heart rate and she converts to a normal sinus rhythm, it may be  reasonable just to place this patient on a full dose aspirin for her  home therapy.  We will also check a transthoracic echocardiogram.  I  would consider performing a stress test in order to rule out coronary  artery disease as an etiology for her atrial fibrillation.  At last, we  will check a TSH and a magnesium level.      Brayton El, MD   Electronically Signed     ______________________________  Jesse Sans. Daleen Squibb, MD, [facility]    SGA/MEDQ  D:  06/04/2008  T:  06/04/2008  Job:  [id number]

## 2011-02-27 NOTE — Assessment & Plan Note (Signed)
[facility]                            CARDIOLOGY OFFICE NOTE   MIESHA, BACHMANN                   MRN:          [id number]  DATE:07/06/2008                            DOB:          1946/09/15    Ms. [name] returns today for followup of her paroxysmal atrial  fibrillation.  We placed a Holter monitor because of recurrent  palpitations.  This showed normal sinus rhythm with 243 supraventricular  events and 46 couplets.  She had very brief SV-tach runs with longest  being 8 beats.   She reported in her diary 4 times some palpitations.  These correlated  with the supraventricular couplets.  She had no atrial fibrillation.   She remains on atenolol 50 mg a day.   She is getting ready to go to [place] and wanted to get it checked out  again.  Please refer to our last note and also the discharge summary  concerning the evaluation of her atrial fib when she was hospitalized.   PHYSICAL EXAMINATION:  GENERAL:  She is in no acute distress.  Very  pleasant.  VITAL SIGNS:  Blood pressure is 133/74, pulse is 70 and regular, weight  is 196.  The rest of her exam is unchanged.   I had a long talk with Ms. [name].  She has cut out caffeine which  seems to have helped.  She will stay on the atenolol and can take an  extra 50 mg p.r.n. for tachypalpitations.  I think, she is safe to go  [place], though she knows atrial fib is unpredictable.  We will plan on  seeing her back again in several months.     Thomas C. Daleen Squibb, MD, [facility]  Electronically Signed    TCW/MedQ  DD: 07/06/2008  DT: 07/07/2008  Job #: [id number]

## 2011-02-27 NOTE — H&P (Signed)
NAMEMARYON, KEMNITZ          ACCOUNT NO.:  [id number]   MEDICAL RECORD NO.:  [id number]          PATIENT TYPE:  INP   LOCATION:  3703                         FACILITY:  [facility]   PHYSICIAN:  Madolyn Frieze. Jens Som, MD, FACCDATE OF BIRTH:  08-Jun-1946   DATE OF ADMISSION:  09/21/2008  DATE OF DISCHARGE:                              HISTORY & PHYSICAL   PRIMARY CARE PHYSICIAN:  Georgina Quint. Plotnikov, MD   PRIMARY CARDIOLOGIST:  Jesse Sans. Wall, MD, [facility]   CHIEF COMPLAINT:  Palpitations.   HISTORY OF PRESENT ILLNESS:  Amanda Wilkins is a 65 year old female with  a history of paroxysmal atrial fibrillation.  She got up at  approximately 3 a.m. to go to the bathroom and drank a whole glass of  water.  After drinking water, she had sudden onset of tachy  palpitations.  There was no chest pain or shortness of breath.  There  was no presyncope or syncope.  Per the patient, she was completely  asymptomatic except that she could feel that her heart was beating  rapidly and out of rhythm.  These have been her symptoms in the past.  When they did not resolve, she came to the emergency room.  In the  emergency room, initially, her heart rate was 158.  She received IV  boluses of Cardizem which reduced her rate somewhat, but she did not  convert.  She has been started on a Cardizem drip.  Amanda Wilkins states  that she drinks a single cup of coffee only 1 or 2 times a week.  She  did have 2 glasses of wine last night, but does not abuse alcohol.  She  denies any other source of caffeine and has taken no over-the-counter  medications recently.   PAST MEDICAL HISTORY:  1. Hypertension.  2. Obesity.  3. Paroxysmal atrial fibrillation.  4. Status post echocardiogram in August 2009 showing an EF of 60-70%      with mild MR.   SURGICAL HISTORY:  C-section.   ALLERGIES:  PENICILLIN and ASPIRIN.   CURRENT MEDICATIONS:  1. Atenolol 50 mg a day.  2. Aspirin 81 mg a day.  3. Activella estradiol  daily.   SOCIAL HISTORY:  She lives in [place] with her husband.  She works  part-time as an Environmental health practitioner.  She denies any history of  alcohol, tobacco, or drug abuse.   FAMILY HISTORY:  Mother died at age 58 with hypertension, but no heart  disease.  Her father died at age 82 of cancer, but did have a history of  mild heart attacks in his 36s.  No siblings have coronary artery  disease.   REVIEW OF SYSTEMS:  She has had some anxiety.  She has occasional  arthralgias.  She has had no palpitations recently until today.  She  denies reflux symptoms, hematemesis, hemoptysis, or melena.  Full 14-  point review of systems is otherwise negative.   PHYSICAL EXAMINATION:  VITAL SIGNS:  Temperature is 97.4, blood pressure  158/77, pulse 158, respiratory rate 19, and O2 saturation 99% on room  air.  GENERAL:  She is a well-developed, well-nourished  white female in no  acute distress.  HEENT:  Normal.  NECK:  There is no lymphadenopathy, thyromegaly, bruit, or JVD noted.  CARDIOVASCULAR:  Her heart is irregular rate and rhythm with an S1 and  S2 and no significant murmur, rub, or gallop is noted.  Distal pulses  are 2+ in all 4 extremities and no femoral bruits are appreciated.  LUNGS:  Clear to auscultation bilaterally except for a few rales in the  bases.  SKIN:  No rashes or lesions are noted.  ABDOMEN:  Soft and nontender with active bowel sounds.  EXTREMITIES:  There is no cyanosis, clubbing, or edema noted.  MUSCULOSKELETAL:  There is no joint deformity or effusions and no spine  or CVA tenderness.  NEURO:  She is alert and oriented.  Cranial nerves II-XII grossly  intact.   Chest x-ray left basilar atelectasis.   EKG, AFib, RVR, rate 102 with no acute changes.   LABORATORY VALUES:  Hemoglobin 14.3, hematocrit 42, sodium 138,  potassium 3.7, chloride 109, BUN 17, creatinine 1.0, and glucose 114.  Point-of-care markers negative x1.   IMPRESSION:  Amanda Wilkins was  seen today by Dr. Jens Som.  She is a 65-  year-old female with a history of paroxysmal atrial fibrillation,  hypertension, and recurrent atrial fibrillation.  She developed  palpitations at 3 a.m. after drinking cold water.  There were no  associated symptoms.  Her EKG is atrial fibrillation, rapid ventricular  response.  We started Cardizem for rate control and it is currently at  20 mg an hour.  TSH is pending.  If she does not convert, cardioversion  can be performed in the a.m.  if paroxysmal atrial fibrillation becomes  more frequent, antiarrhythmic such as flecainide could be considered.  She has embolic risk factors of hypertension and female gender.  It may  be prudent to add Coumadin over aspirin.  The final decision will be up  to Dr. Daleen Squibb.  She will be treated with Lovenox and aspirin for now.  We  will also continue her on her home dose of atenolol, but divided into 25  mg b.i.d.      Theodore Demark, PA-C      Madolyn Frieze. Jens Som, MD, [facility]  Electronically Signed    RB/MEDQ  D:  09/21/2008  T:  09/21/2008  Job:  [id number]

## 2011-04-23 ENCOUNTER — Telehealth: Payer: Self-pay | Admitting: Cardiology

## 2011-04-23 ENCOUNTER — Ambulatory Visit (INDEPENDENT_AMBULATORY_CARE_PROVIDER_SITE_OTHER): Payer: Managed Care, Other (non HMO) | Admitting: Internal Medicine

## 2011-04-23 DIAGNOSIS — R55 Syncope and collapse: Secondary | ICD-10-CM

## 2011-04-23 NOTE — Telephone Encounter (Signed)
Pt calling wanting to schedule appt with Dr. Daleen Squibb due to pt blacking out last night when pt jumped into pool. Pt currently has appt w/ Dr. Daleen Squibb on August 3rd, would like to know if pt can see Dr. Daleen Squibb sooner or be advised as to what to do.

## 2011-04-23 NOTE — Telephone Encounter (Signed)
I spoke with pt who said she " passed out after jumping into the pool this yesterday". EMS was not called according to pt. She did experience " rapid heartrate of 118 on Friday".  She is following up with her pcp today at 4pm. Mylo Red RN

## 2011-04-25 NOTE — Assessment & Plan Note (Signed)
Patient with what by her description of symptoms and the setting and circumstances sounds like a vaso-vagal near syncope secondary to the heat, alcohol and rapid position change. Her exam is normal.  Plan - reassurance           Caution re: rapid position change           Caution re: combination of heat and alcohol.

## 2011-04-25 NOTE — Progress Notes (Signed)
  Subjective:    Patient ID: Amanda Wilkins, female    DOB: May 05, 1946, 65 y.o.   MRN: [id number]  HPI Mrs. [name] presents after an episode where she passed out in the swimming pool. After a long weekend she was relaxing on the patio. She had had several glasses of wine, up to 4, when her husband decided to take a swim. She quickly got up and went to the pool and dove in. Within seconds she dropped her head in the water. She reports that she did not loose consciousness but felt that she was flailing in the water and that she was taking in water. It seemed to her that a long time passed. Her husband was at hand and he reported toher that only seconds passed before he rolled her over and then stood her up. She was fully conscious, had no confusion, no coughing or choking to indicate any aspiration of water and felt normal. She was shaken up but got out of the pool and then went to bed. She has had no sequelae, no persistent or recurrent symptoms and generally feels that she is normal. She had no incontinence of bowel or bladder, no post ictal state or focal weakness, had had no palpitations or chest pain.  Past Medical History  Diagnosis Date  . Allergy   . Atrial fibrillation   . Migraines     on Zoloft for migraines  . Hypertension   . Chronic interstitial nephritis    Past Surgical History  Procedure Date  . Cesarean section    No family history on file. History   Social History  . Marital Status: Married    Spouse Name: N/A    Number of Children: N/A  . Years of Education: N/A   Occupational History  . Not on file.   Social History Main Topics  . Smoking status: Never Smoker   . Smokeless tobacco: Not on file  . Alcohol Use: 8.4 oz/week    14 Glasses of wine per week  . Drug Use: No  . Sexually Active:    Other Topics Concern  . Not on file   Social History Narrative  . No narrative on file       Review of Systems Review of Systems  Constitutional:   Negative for fever, chills, activity change and unexpected weight change.  HEENT:  Negative for hearing loss, ear pain, congestion, neck stiffness and postnasal drip. Negative for sore throat or swallowing problems. Negative for dental complaints.   Eyes: Negative for vision loss or change in visual acuity.  Respiratory: Negative for chest tightness and wheezing.   Cardiovascular: Negative for chest pain and palpitation. No decreased exercise tolerance Gastrointestinal: No change in bowel habit. No bloating or gas. No reflux or indigestion Genitourinary: Negative for urgency, frequency, flank pain and difficulty urinating.  Musculoskeletal: Negative for myalgias, back pain, arthralgias and gait problem.  Neurological: Negative for dizziness, tremors, weakness and headaches.  Hematological: Negative for adenopathy.  Psychiatric/Behavioral: Negative for behavioral problems and dysphoric mood.       Objective:   Physical Exam Vitals reviewed Gen'l - WNWD white woman in no distress HEENT - normal Pulmonary - normal respiration and clear lungs Cor- RRR Neuro - A&O x 3, CN II-XII normal with normal facial symmetry, PERRLA, EOMI, no deviation or fasciculation of the tongue, MS normal, cerebellar function normal       Assessment & Plan:

## 2011-05-03 ENCOUNTER — Other Ambulatory Visit: Payer: Self-pay | Admitting: Cardiology

## 2011-05-11 ENCOUNTER — Encounter: Payer: Self-pay | Admitting: Cardiology

## 2011-05-18 ENCOUNTER — Ambulatory Visit (INDEPENDENT_AMBULATORY_CARE_PROVIDER_SITE_OTHER): Payer: Managed Care, Other (non HMO) | Admitting: Cardiology

## 2011-05-18 ENCOUNTER — Encounter: Payer: Self-pay | Admitting: Cardiology

## 2011-05-18 VITALS — BP 132/67 | HR 75 | Ht 66.0 in | Wt 209.8 lb

## 2011-05-18 DIAGNOSIS — I4891 Unspecified atrial fibrillation: Secondary | ICD-10-CM

## 2011-05-18 MED ORDER — DILTIAZEM HCL ER 180 MG PO CP24: 180.0000 mg | ORAL_CAPSULE | Freq: Every day | ORAL | Status: DC

## 2011-05-18 NOTE — Patient Instructions (Signed)
Your physician recommends that you schedule a follow-up appointment in: 1 year with Dr. Wall  

## 2011-05-18 NOTE — Progress Notes (Signed)
HPI [name] returns today for evaluation and management of her history of atrial fib. His last visit she's had no clinical recurrences. She remains on diltiazem 180 mg q. Day.  She denies orthopnea, PND or edema. She remains very active and travels quite a bit.   EKG today shows normal sinus rhythm, normal EKG. Past Medical History  Diagnosis Date  . Allergy   . Atrial fibrillation   . Migraines     on Zoloft for migraines  . Hypertension   . Chronic interstitial nephritis   . Depression   . Palpitations   . Obesity     Past Surgical History  Procedure Date  . Cesarean section     No family history on file.  History   Social History  . Marital Status: Married    Spouse Name: N/A    Number of Children: N/A  . Years of Education: N/A   Occupational History  . Not on file.   Social History Main Topics  . Smoking status: Never Smoker   . Smokeless tobacco: Not on file  . Alcohol Use: 8.4 oz/week    14 Glasses of wine per week  . Drug Use: No  . Sexually Active:    Other Topics Concern  . Not on file   Social History Narrative  . No narrative on file    Allergies  Allergen Reactions  . Penicillins   . Epinephrine Palpitations    Current Outpatient Prescriptions  Medication Sig Dispense Refill  . aspirin 325 MG tablet Take 325 mg by mouth daily.        [name] atenolol (TENORMIN) 50 MG tablet 25 mg.       . Cholecalciferol (VITAMIN D-3 PO) Take 2,000 mg by mouth daily.        [name] diltiazem (DILACOR XR) 180 MG 24 hr capsule TAKE 1 CAPSULE BY MOUTH DAILY  30 capsule  0  . medroxyPROGESTERone (PROVERA) 10 MG tablet Take 10 mg by mouth. 10 days/month       . METROGEL 1 % gel       . PREMARIN 0.3 MG tablet Take 0.3 mg by mouth daily.       . sertraline (ZOLOFT) 50 MG tablet        Current Facility-Administered Medications  Medication Dose Route Frequency Provider Last Rate Last Dose  . 0.9 %  sodium chloride infusion  500 mL Intravenous Continuous Hart Carwin, MD        ROS Negative other than HPI.   PE General Appearance: well developed, well nourished in no acute distress,obese HEENT: symmetrical face, PERRLA, good dentition  Neck: no JVD, thyromegaly, or adenopathy, trachea midline Chest: symmetric without deformity Cardiac: PMI non-displaced, RRR, normal S1, S2, no gallop or murmur Lung: clear to ausculation and percussion Vascular: all pulses full without bruits  Abdominal: nondistended, nontender, good bowel sounds, no HSM, no bruits Extremities: no cyanosis, clubbing or edema, no sign of DVT, no varicosities  Skin: normal color, no rashes Neuro: alert and oriented x 3, non-focal Pysch: normal affect Filed Vitals:   05/18/11 1444  BP: 132/67  Pulse: 75  Height: 5\' 6"  (1.676 m)  Weight: 209 lb 12.8 oz (95.165 kg)    EKG  Labs and Studies Reviewed.   Lab Results  Component Value Date   WBC 6.3 04/16/2009   HGB 13.2 04/16/2009   HCT 38.7 04/16/2009   MCV 98.5 04/16/2009   PLT 195 04/16/2009  Chemistry      Component Value Date/Time   NA 134* 11/08/2009 0029   K 5.4* 11/08/2009 0029   CL 99 11/08/2009 0029   CO2 20 11/08/2009 0029   BUN 14 11/08/2009 0029   CREATININE 0.81 11/08/2009 0029      Component Value Date/Time   CALCIUM 9.2 11/08/2009 0029   ALKPHOS 65 04/16/2009 0020   AST 29 04/16/2009 0020   ALT 23 04/16/2009 0020   BILITOT 0.7 04/16/2009 0020       Lab Results  Component Value Date   CHOL 173 04/16/2009   CHOL 196 02/19/2008   Lab Results  Component Value Date   HDL 85 04/16/2009   HDL 78 02/19/2008   Lab Results  Component Value Date   LDLCALC 74 04/16/2009   LDLCALC 99 02/19/2008   Lab Results  Component Value Date   TRIG 72 04/16/2009   TRIG 93 02/19/2008   Lab Results  Component Value Date   CHOLHDL 2.0 Ratio 04/16/2009   CHOLHDL 2.5 Ratio 02/19/2008   Lab Results  Component Value Date   HGBA1C 5.5 02/20/2008   Lab Results  Component Value Date   ALT 23 04/16/2009   AST 29 04/16/2009   ALKPHOS 65  04/16/2009   BILITOT 0.7 04/16/2009   Lab Results  Component Value Date   TSH 1.969 11/08/2009

## 2011-05-18 NOTE — Assessment & Plan Note (Signed)
Stable. Continue aspirin and diltiazem has been renewed. Follow up in one year.

## 2011-06-11 ENCOUNTER — Encounter: Payer: Self-pay | Admitting: Internal Medicine

## 2011-06-11 ENCOUNTER — Ambulatory Visit (INDEPENDENT_AMBULATORY_CARE_PROVIDER_SITE_OTHER): Payer: Managed Care, Other (non HMO) | Admitting: Internal Medicine

## 2011-06-11 ENCOUNTER — Other Ambulatory Visit: Payer: Self-pay | Admitting: Gynecology

## 2011-06-11 VITALS — BP 118/78 | HR 80 | Temp 99.3°F | Ht 66.0 in

## 2011-06-11 DIAGNOSIS — L659 Nonscarring hair loss, unspecified: Secondary | ICD-10-CM

## 2011-06-11 LAB — CBC WITH DIFFERENTIAL/PLATELET
Basophils Absolute: 0 10*3/uL (ref 0.0–0.1)
Basophils Relative: 1 % (ref 0–1)
Eosinophils Absolute: 0.2 10*3/uL (ref 0.0–0.7)
Eosinophils Relative: 3 % (ref 0–5)
Lymphocytes Relative: 40 % (ref 12–46)
MCHC: 34.4 g/dL (ref 30.0–36.0)
MCV: 97 fL (ref 78.0–100.0)
Platelets: 200 10*3/uL (ref 150–400)
RDW: 13.1 % (ref 11.5–15.5)
WBC: 6.6 10*3/uL (ref 4.0–10.5)

## 2011-06-11 LAB — HEPATIC FUNCTION PANEL
Albumin: 4.5 g/dL (ref 3.5–5.2)
Indirect Bilirubin: 0.4 mg/dL (ref 0.0–0.9)
Total Protein: 7.1 g/dL (ref 6.0–8.3)

## 2011-06-11 LAB — TSH: TSH: 3.116 u[IU]/mL (ref 0.350–4.500)

## 2011-06-11 LAB — FERRITIN: Ferritin: 163 ng/mL (ref 10–291)

## 2011-06-11 NOTE — Progress Notes (Signed)
  Subjective:    Patient ID: Amanda Wilkins, female    DOB: Sep 27, 1946, 65 y.o.   MRN: [id number]  HPI  Concern for loss of hair - scalp and eyebrows Prior eval by PCP for same 01/2011 - rogaine not helpful Has used "gel" for folliculitis and eczema as rx'd by derm - not helping  Past Medical History  Diagnosis Date  . Allergy   . Atrial fibrillation   . Migraines     on Zoloft for migraines  . Hypertension   . Chronic interstitial nephritis   . Depression   . Palpitations   . Obesity      Review of Systems  Constitutional: Negative for fever and unexpected weight change.  Genitourinary: Negative for dysuria.  Neurological: Negative for headaches.       Objective:   Physical Exam BP 118/78  Pulse 80  Temp(Src) 99.3 F (37.4 C) (Oral)  Ht 5\' 6"  (1.676 m)  SpO2 98% Constitutional: She appears well-developed and well-nourished. No distress.  Neck: Normal range of motion. Neck supple. No JVD present. No thyromegaly present.  Cardiovascular: Normal rate, regular rhythm and normal heart sounds.  No murmur heard. No BLE edema. Pulmonary/Chest: Effort normal and breath sounds normal. No respiratory distress. She has no wheezes.  Skin: thinning without balding crown, no facial hair including no eyebrow hair. skin is warm and dry. No rash noted. No erythema, no folliculitis.  Psychiatric: She has a normal mood and affect. Her behavior is normal. Judgment and thought content normal.   Lab Results  Component Value Date   WBC 6.3 04/16/2009   HGB 13.2 04/16/2009   HCT 38.7 04/16/2009   PLT 195 04/16/2009   CHOL 173 04/16/2009   TRIG 72 04/16/2009   HDL 85 04/16/2009   ALT 23 04/16/2009   AST 29 04/16/2009   NA 134* 11/08/2009   K 5.4* 11/08/2009   CL 99 11/08/2009   CREATININE 0.81 11/08/2009   BUN 14 11/08/2009   CO2 20 11/08/2009   TSH 1.969 11/08/2009   HGBA1C 5.5 02/20/2008       Assessment & Plan:  Alopecia - check labs now - follow up derm for eval/tx of same

## 2011-06-11 NOTE — Patient Instructions (Signed)
It was good to see you today. Test(s) ordered today. Your results will be called to you after review (48-72hours after test completion). If any changes need to be made, you will be notified at that time. Medications reviewed, no changes at this time. Keep follow up with Dr. Harrel Lemon as discussed -

## 2011-06-11 NOTE — Assessment & Plan Note (Addendum)
Prior eval for same by PCP 01/2011 - tried OTC rogaine without improvement Pt felt related to discontinued HRT - but has since resumed same - no other med changes Scalp ongoing >46mo, now eyebrows for few months  Check screening labs now for underlying medical/autoimmune dx and follow up with dermatology as planned

## 2011-06-12 LAB — ANA: Anti Nuclear Antibody(ANA): NEGATIVE

## 2011-06-19 ENCOUNTER — Telehealth: Payer: Self-pay

## 2011-06-19 NOTE — Telephone Encounter (Signed)
Pt called requesting results of labs ordered by Dr Felicity Coyer 08/27. Dr Felicity Coyer is out of office, can you please advise?

## 2011-06-19 NOTE — Telephone Encounter (Signed)
Labs OK.  Please, mail the labs to the patient. Thx

## 2011-06-20 NOTE — Telephone Encounter (Signed)
Pt advised, copy faxed to Dr Danella Deis then mailed to pt.

## 2011-06-22 ENCOUNTER — Ambulatory Visit (INDEPENDENT_AMBULATORY_CARE_PROVIDER_SITE_OTHER): Payer: Managed Care, Other (non HMO) | Admitting: Gynecology

## 2011-06-22 ENCOUNTER — Encounter: Payer: Self-pay | Admitting: Gynecology

## 2011-06-22 ENCOUNTER — Other Ambulatory Visit (HOSPITAL_COMMUNITY)
Admission: RE | Admit: 2011-06-22 | Discharge: 2011-06-22 | Disposition: A | Discharge status: Home / Self Care | Payer: Managed Care, Other (non HMO) | Source: Ambulatory Visit | Attending: Gynecology | Admitting: Gynecology

## 2011-06-22 VITALS — BP 126/84

## 2011-06-22 DIAGNOSIS — Z124 Encounter for screening for malignant neoplasm of cervix: Secondary | ICD-10-CM

## 2011-06-22 DIAGNOSIS — Z01419 Encounter for gynecological examination (general) (routine) without abnormal findings: Secondary | ICD-10-CM | POA: Insufficient documentation

## 2011-06-22 DIAGNOSIS — N951 Menopausal and female climacteric states: Secondary | ICD-10-CM

## 2011-06-22 NOTE — Progress Notes (Signed)
Patient 66 year old who seen in the office for her annual gynecological examination in March of 2012. Her Pap smear had demonstrated benign reactive reparative changes but negative for intraepithelial lesions. Her last visit we discussed about tapering her off of hormone replacement therapy. She was on Premarin 0.3 mg daily and was taking in addition Provera 10 mg for 10 days of the month. She attempted to discontinue the medication and within a month been suffering from hot flashes irritability and vaginal dryness and has resumed hormone replacement therapy. She is doing well otherwise. Patient previously been counseled as to risks benefits and pros and cons of hormone replacement therapy as well as the women's health initiative study. She fully understands and accepts. In this current mammogram and does her monthly self breast examinations. Pap smear was repeated today we'll see her back in 6 months or when necessary.

## 2011-07-05 ENCOUNTER — Other Ambulatory Visit: Payer: Self-pay | Admitting: Internal Medicine

## 2011-07-20 LAB — PROTIME-INR: Prothrombin Time: 12 seconds (ref 11.6–15.2)

## 2011-07-20 LAB — CBC
HCT: 40.8 % (ref 36.0–46.0)
Hemoglobin: 13.9 g/dL (ref 12.0–15.0)
MCHC: 34 g/dL (ref 30.0–36.0)
MCV: 97.6 fL (ref 78.0–100.0)
Platelets: 202 10*3/uL (ref 150–400)
RDW: 13.1 % (ref 11.5–15.5)

## 2011-07-20 LAB — POCT I-STAT, CHEM 8
BUN: 17 mg/dL (ref 6–23)
Calcium, Ion: 1.16 mmol/L (ref 1.12–1.32)
Chloride: 109 meq/L (ref 96–112)
Creatinine, Ser: 1 mg/dL (ref 0.4–1.2)
Glucose, Bld: 114 mg/dL — ABNORMAL HIGH (ref 70–99)
HCT: 42 % (ref 36.0–46.0)
Hemoglobin: 14.3 g/dL (ref 12.0–15.0)
Potassium: 3.7 meq/L (ref 3.5–5.1)
Sodium: 138 meq/L (ref 135–145)
TCO2: 19 mmol/L (ref 0–100)

## 2011-07-20 LAB — POCT CARDIAC MARKERS
CKMB, poc: 1 ng/mL (ref 1.0–8.0)
Myoglobin, poc: 39 ng/mL (ref 12–200)
Troponin i, poc: <0.05 ng/mL (ref 0.00–0.09)

## 2011-07-20 LAB — MAGNESIUM: Magnesium: 2.3 mg/dL (ref 1.5–2.5)

## 2011-09-17 ENCOUNTER — Ambulatory Visit: Payer: Managed Care, Other (non HMO)

## 2011-09-20 ENCOUNTER — Ambulatory Visit: Payer: Managed Care, Other (non HMO)

## 2011-09-24 ENCOUNTER — Ambulatory Visit (INDEPENDENT_AMBULATORY_CARE_PROVIDER_SITE_OTHER): Payer: Managed Care, Other (non HMO) | Admitting: *Deleted

## 2011-09-24 DIAGNOSIS — Z23 Encounter for immunization: Secondary | ICD-10-CM

## 2011-10-18 ENCOUNTER — Other Ambulatory Visit: Payer: Self-pay | Admitting: Internal Medicine

## 2011-10-22 ENCOUNTER — Ambulatory Visit (INDEPENDENT_AMBULATORY_CARE_PROVIDER_SITE_OTHER): Payer: Managed Care, Other (non HMO) | Admitting: Internal Medicine

## 2011-10-22 ENCOUNTER — Encounter: Payer: Self-pay | Admitting: Internal Medicine

## 2011-10-22 DIAGNOSIS — J399 Disease of upper respiratory tract, unspecified: Secondary | ICD-10-CM | POA: Insufficient documentation

## 2011-10-22 DIAGNOSIS — J398 Other specified diseases of upper respiratory tract: Secondary | ICD-10-CM

## 2011-10-22 MED ORDER — HYDROCOD POLST-CPM POLST ER 10-8 MG PO CP12: 1.0000 | ORAL_CAPSULE | Freq: Two times a day (BID) | ORAL | Status: DC | PRN

## 2011-10-22 MED ORDER — AZITHROMYCIN 250 MG PO TABS: ORAL_TABLET | ORAL | Status: AC

## 2011-10-22 NOTE — Assessment & Plan Note (Signed)
See meds 

## 2011-10-22 NOTE — Progress Notes (Signed)
  Subjective:    Patient ID: Amanda Wilkins, female    DOB: [birth year], 66 y.o.   MRN: [id number]  HPI  HPI  C/o URI sx's x  7 days. C/o ST,  R earache, cough, weakness. Not better with OTC medicines. Actually, the patient is getting worse. The patient did not sleep last night due to cough.  Review of Systems  Constitutional: Positive for fever, chills and fatigue.  HENT: Positive for congestion, rhinorrhea, sneezing and postnasal drip.   Eyes: Positive for photophobia and pain. Negative for discharge and visual disturbance.  Respiratory: Positive for cough  Gastrointestinal: Negative for vomiting, abdominal pain, diarrhea and abdominal distention.  Genitourinary: Negative for dysuria and difficulty urinating.  Skin: Negative for rash.  Neurological: Positive for dizziness, weakness and light-headedness.       Review of Systems     Objective:   Physical Exam  Constitutional: She appears well-developed. No distress.  HENT:  Head: Normocephalic.  Left Ear: External ear normal.  Mouth/Throat: No oropharyngeal exudate.       R TM is red eryth mucosa  Eyes: Conjunctivae are normal. Pupils are equal, round, and reactive to light. Right eye exhibits no discharge. Left eye exhibits no discharge.  Neck: Normal range of motion. Neck supple. No JVD present. No tracheal deviation present. No thyromegaly present.  Cardiovascular: Normal rate, regular rhythm and normal heart sounds.   Pulmonary/Chest: No stridor. No respiratory distress. She has no wheezes.  Abdominal: Soft. Bowel sounds are normal. She exhibits no distension and no mass. There is no tenderness. There is no rebound and no guarding.  Musculoskeletal: She exhibits no edema and no tenderness.  Lymphadenopathy:    She has no cervical adenopathy.  Neurological: She displays normal reflexes. No cranial nerve deficit. She exhibits normal muscle tone. Coordination normal.  Skin: No rash noted. No erythema.  Psychiatric: She  has a normal mood and affect. Her behavior is normal. Judgment and thought content normal.          Assessment & Plan:

## 2011-10-22 NOTE — Patient Instructions (Signed)
Use over-the-counter  "cold" medicines  such as  "Afrin" nasal spray for nasal congestion as directed instead. Use" Delsym" or" Robitussin" cough syrup varietis for cough.  You can use plain "Tylenol" or "Advi"l for fever, chills and achyness.   "Common cold" symptoms are usually triggered by a virus.  The antibiotics are usually not necessary. On average, a" viral cold" illness would take 4-7 days to resolve. Please, make an appointment if you are not better or if you're worse.  

## 2011-11-15 ENCOUNTER — Encounter: Payer: Self-pay | Admitting: Internal Medicine

## 2011-11-15 ENCOUNTER — Ambulatory Visit (INDEPENDENT_AMBULATORY_CARE_PROVIDER_SITE_OTHER): Payer: Managed Care, Other (non HMO) | Admitting: Internal Medicine

## 2011-11-15 VITALS — BP 140/82 | HR 76 | Temp 97.5°F | Resp 16 | Wt 211.0 lb

## 2011-11-15 DIAGNOSIS — J398 Other specified diseases of upper respiratory tract: Secondary | ICD-10-CM

## 2011-11-15 DIAGNOSIS — J399 Disease of upper respiratory tract, unspecified: Secondary | ICD-10-CM

## 2011-11-15 MED ORDER — PROMETHAZINE-CODEINE 6.25-10 MG/5ML PO SYRP: 5.0000 mL | ORAL_SOLUTION | ORAL | Status: AC | PRN

## 2011-11-15 MED ORDER — LEVOFLOXACIN 500 MG PO TABS: 500.0000 mg | ORAL_TABLET | Freq: Every day | ORAL | Status: AC

## 2011-11-15 NOTE — Patient Instructions (Signed)
Align 1 a day x 2-3 wks

## 2011-11-15 NOTE — Assessment & Plan Note (Signed)
1/13 Sinusitis-bronchitis, refractory Levaquin x 2 wks Align Prom-Cod

## 2011-11-15 NOTE — Progress Notes (Signed)
Patient ID: Amanda Wilkins, female   DOB: [birth year], 66 y.o.   MRN: [id number]  Subjective:    Patient ID: Amanda Wilkins, female    DOB: 1946-10-01, 66 y.o.   MRN: [id number]  Sore Throat  Associated symptoms include ear pain.  Otalgia     HPI  C/o URI sx's x  21+ days. C/o ST,  L earache, cough, weakness. Not better with previous Zpac, OTC medicines. Actually, the patient is getting worse. The patient did not sleep last night due to L earache.  Review of Systems  Constitutional: Positive for fever, chills and fatigue.  HENT: Positive for congestion, rhinorrhea, sneezing and postnasal drip.   Eyes: Positive for photophobia and pain. Negative for discharge and visual disturbance.  Respiratory: Positive for cough  Gastrointestinal: Negative for vomiting, abdominal pain, diarrhea and abdominal distention.  Genitourinary: Negative for dysuria and difficulty urinating.  Skin: Negative for rash.  Neurological: Positive for dizziness, weakness and light-headedness.       Review of Systems  HENT: Positive for ear pain.        Objective:   Physical Exam  Constitutional: She appears well-developed. No distress.  HENT:  Head: Normocephalic.  Left Ear: External ear normal.  Mouth/Throat: No oropharyngeal exudate.       B TMs are OK eryth nasal mucosa  Eyes: Conjunctivae are normal. Pupils are equal, round, and reactive to light. Right eye exhibits no discharge. Left eye exhibits no discharge.  Neck: Normal range of motion. Neck supple. No JVD present. No tracheal deviation present. No thyromegaly present.  Cardiovascular: Normal rate, regular rhythm and normal heart sounds.   Pulmonary/Chest: No stridor. No respiratory distress. She has no wheezes.  Abdominal: Soft. Bowel sounds are normal. She exhibits no distension and no mass. There is no tenderness. There is no rebound and no guarding.  Musculoskeletal: She exhibits no edema and no tenderness.  Lymphadenopathy:    She  has no cervical adenopathy.  Neurological: She displays normal reflexes. No cranial nerve deficit. She exhibits normal muscle tone. Coordination normal.  Skin: No rash noted. No erythema.  Psychiatric: She has a normal mood and affect. Her behavior is normal. Judgment and thought content normal.          Assessment & Plan:

## 2011-11-15 NOTE — Progress Notes (Signed)
  Subjective:    Patient ID: Amanda Wilkins, female    DOB: 07-11-1946, 66 y.o.   MRN: [id number]  HPI    Review of Systems     Objective:   Physical Exam        Assessment & Plan:

## 2011-12-04 DIAGNOSIS — M79609 Pain in unspecified limb: Secondary | ICD-10-CM | POA: Diagnosis not present

## 2011-12-04 DIAGNOSIS — M76899 Other specified enthesopathies of unspecified lower limb, excluding foot: Secondary | ICD-10-CM | POA: Diagnosis not present

## 2011-12-04 DIAGNOSIS — M7989 Other specified soft tissue disorders: Secondary | ICD-10-CM | POA: Diagnosis not present

## 2011-12-04 DIAGNOSIS — M1710 Unilateral primary osteoarthritis, unspecified knee: Secondary | ICD-10-CM | POA: Diagnosis not present

## 2011-12-10 DIAGNOSIS — M719 Bursopathy, unspecified: Secondary | ICD-10-CM | POA: Diagnosis not present

## 2011-12-18 DIAGNOSIS — S83289A Other tear of lateral meniscus, current injury, unspecified knee, initial encounter: Secondary | ICD-10-CM | POA: Diagnosis not present

## 2011-12-19 ENCOUNTER — Other Ambulatory Visit: Payer: Self-pay | Admitting: Gynecology

## 2011-12-19 DIAGNOSIS — Z1231 Encounter for screening mammogram for malignant neoplasm of breast: Secondary | ICD-10-CM

## 2011-12-19 DIAGNOSIS — F4323 Adjustment disorder with mixed anxiety and depressed mood: Secondary | ICD-10-CM | POA: Diagnosis not present

## 2011-12-26 ENCOUNTER — Ambulatory Visit (INDEPENDENT_AMBULATORY_CARE_PROVIDER_SITE_OTHER): Payer: Medicare Other | Admitting: Gynecology

## 2011-12-26 ENCOUNTER — Encounter: Payer: Self-pay | Admitting: Gynecology

## 2011-12-26 VITALS — BP 126/84 | Ht 64.25 in | Wt 215.0 lb

## 2011-12-26 DIAGNOSIS — N83209 Unspecified ovarian cyst, unspecified side: Secondary | ICD-10-CM

## 2011-12-26 DIAGNOSIS — Z79890 Hormone replacement therapy (postmenopausal): Secondary | ICD-10-CM

## 2011-12-26 DIAGNOSIS — Z01419 Encounter for gynecological examination (general) (routine) without abnormal findings: Secondary | ICD-10-CM

## 2011-12-26 DIAGNOSIS — N83202 Unspecified ovarian cyst, left side: Secondary | ICD-10-CM

## 2011-12-26 DIAGNOSIS — L659 Nonscarring hair loss, unspecified: Secondary | ICD-10-CM

## 2011-12-26 MED ORDER — ESTROGENS CONJUGATED 0.3 MG PO TABS: 0.3000 mg | ORAL_TABLET | Freq: Every day | ORAL | Status: DC

## 2011-12-26 MED ORDER — MEDROXYPROGESTERONE ACETATE 10 MG PO TABS: 10.0000 mg | ORAL_TABLET | Freq: Every day | ORAL | Status: DC

## 2011-12-26 NOTE — Progress Notes (Signed)
Amanda Wilkins [birth year] [id number]   History:    66 y.o.  for annual exam who is been complaining of weight gain as well as hair loss. She is on hormone replacement therapy consisting of Premarin 0.3 mg daily with the addition of Provera 10 mg for 10 days of the month. She reports no unusual or irregular bleeding. Dr. Posey Rea has been following her for her hypertension and has been doing her lab work. Also Dr. Daleen Squibb has been following her for atrial fibrillation. Please see epic record for details. Patient had a negative colonoscopy in 2012. Her last bone density study was a little less than 2 years ago which was normal. Her last mammogram was in March 2012 and she has a scheduled appointment next few weeks. She frequently does her self breast examination. Review of her record indicated in March 2000 and she had a thin echo-free cyst on the left ovary which measured 17 x 19 x 19 mm and she had a normal CA 125.  Past medical history,surgical history, family history and social history were all reviewed and documented in the EPIC chart.  Gynecologic History No LMP recorded. Patient is postmenopausal. Contraception: none Last Pap: 2012. Results were: normal Last mammogram: 2012. Results were: normal  Obstetric History OB History    Grav Para Term Preterm Abortions TAB SAB Ect Mult Living   2 2 2       2      # Outc Date GA Lbr Len/2nd Wgt Sex Del Anes PTL Lv   1 TRM     M CS  No Yes   2 TRM     M SVD  No Yes       ROS:  Was performed and pertinent positives and negatives are included in the history.  Exam: chaperone present  BP 126/84  Ht 5' 4.25" (1.632 m)  Wt 215 lb (97.523 kg)  BMI 36.62 kg/m2  Body mass index is 36.62 kg/(m^2).  General appearance : Well developed well nourished female. No acute distress HEENT: Thinning of scalp noted.  Neck supple, trachea midline, no carotid bruits, no thyroidmegaly Lungs: Clear to auscultation, no rhonchi or wheezes, or rib retractions    Heart: Regular rate and rhythm, no murmurs or gallops Breast:Examined in sitting and supine position were symmetrical in appearance, no palpable masses or tenderness,  no skin retraction, no nipple inversion, no nipple discharge, no skin discoloration, no axillary or supraclavicular lymphadenopathy Abdomen: no palpable masses or tenderness, no rebound or guarding Extremities: no edema or skin discoloration or tenderness  Pelvic:  Bartholin, Urethra, Skene Glands: Within normal limits             Vagina: No gross lesions or discharge  Cervix: No gross lesions or discharge  Uterus  axial, normal size, shape and consistency, non-tender and mobile  Adnexa  Without masses or tenderness  Anus and perineum  normal   Rectovaginal  normal sphincter tone without palpated masses or tenderness             Hemoccult not done colonoscopy less than one year ago     Assessment/Plan:  66 y.o. female for annual exam with complaints of weight gain in sitting over here. We discussed that the symptoms may be attributed to home replacement therapy. We had a lengthy discussion of the women's health initiative study and recommend begin to taper off her hormone replacement therapy to completely come off of it. She will start to take the Premarin 0.3 mg every  other day for 2 weeks then afterwards to followup twice a week for 2 weeks and then taper off completely. During this time frame she still was instructed to continue the Provera 10 mg for 10 days of the month while she still on the estrogen. We'll check a TSH and total testosterone because of her hair loss. If the test results are normal and after discontinue hormone replacement therapy she took complaints of thinning and loss of hair she would need to followup with her dermatologist which she had done in the past. She informed me that in the past her dermatologist was considering doing a biopsy and place her on Rogaine but she did not follow through. Patient will  return back next week for followup ultrasound on the small echo-free cyst seen on prior ultrasound. No Pap smear done today she is 66 years of age. We discussed the new screening guidelines. She was instructed to continue with her calcium and vitamin D for osteoporosis prevention.   Ok Edwards MD, 11:49 AM 12/26/2011

## 2011-12-26 NOTE — Patient Instructions (Signed)
[name], as we talked about in the office I would like for you to start tapering down your Premarin. For the next 2 weeks take 1 tablet every other day. The following 2 weeks take 1 tablet on Monday Wednesday and Friday. The following 2 weeks take 1 tablet twice a week and 8 after that you'll be off of it completely. For that month still take the Provera 10 mg for 10 days. We'll monitor your symptoms if you have vaginal dryness please contact the office and we will call you in the Vagifem vaginal tablets that we talked about that you would apply twice a week. I've given information on 2 probiotic vaginal gels that you can purchase over-the-counter and you can apply 2-3 times a week. There names are refresh or Luvena. I will notify you there is any abnormality of any the blood tests today. [name] for the ultrasound.

## 2011-12-28 DIAGNOSIS — L659 Nonscarring hair loss, unspecified: Secondary | ICD-10-CM | POA: Diagnosis not present

## 2012-01-02 ENCOUNTER — Ambulatory Visit (INDEPENDENT_AMBULATORY_CARE_PROVIDER_SITE_OTHER): Payer: Medicare Other

## 2012-01-02 ENCOUNTER — Ambulatory Visit (INDEPENDENT_AMBULATORY_CARE_PROVIDER_SITE_OTHER): Payer: Medicare Other | Admitting: Gynecology

## 2012-01-02 DIAGNOSIS — N859 Noninflammatory disorder of uterus, unspecified: Secondary | ICD-10-CM

## 2012-01-02 DIAGNOSIS — N83209 Unspecified ovarian cyst, unspecified side: Secondary | ICD-10-CM | POA: Diagnosis not present

## 2012-01-02 DIAGNOSIS — N839 Noninflammatory disorder of ovary, fallopian tube and broad ligament, unspecified: Secondary | ICD-10-CM | POA: Diagnosis not present

## 2012-01-02 DIAGNOSIS — N83202 Unspecified ovarian cyst, left side: Secondary | ICD-10-CM

## 2012-01-02 NOTE — Progress Notes (Signed)
and patient's a 66 year old who was seen in the office on March 13 her annual exam and had been complaining of hair loss and weight gain. She has been on hormone replacement therapy consisting of Premarin 0.3 mg daily with the addition of Provera 10 mg for 10 days of each month. We have had a lengthy discussion on the women's health initiative study and had recommended that she begin to taper off her hormone replacement therapy. We also were checking her TSH and testosterone which were normal. This was done because she been complaining of hair loss. Also she's here today for an ultrasound due to the fact that in 2010 ultrasound demonstrated she had a postmenopausal ovarian cyst that measured 17 x 19 x 16 mm avascular thinwall on the left ovary. Ultrasound today:  Uterus measured 6.5 x 4.1 x 3.2 cm with endometrial stripe of 3.7 mm. Some fluid was seen in the endometrial cavity appeared right ovary atrophic, left ovary thinwall echo-free cyst measured 15 x 11 mm negative color flow. No fluid in the cul-de-sac.  Patient was reassured on the simple follicle cyst no further followup as needed except an ultrasound in one year. If she continues to complain of loss of hair despite a normal TSH and testosterone and coming off of hormone replacement therapy she will followup with her dermatologist for further evaluation and treatment.

## 2012-01-07 ENCOUNTER — Encounter: Payer: Self-pay | Admitting: Gynecology

## 2012-01-11 ENCOUNTER — Other Ambulatory Visit: Payer: Self-pay | Admitting: Internal Medicine

## 2012-01-23 ENCOUNTER — Ambulatory Visit: Payer: Managed Care, Other (non HMO)

## 2012-02-04 ENCOUNTER — Ambulatory Visit
Admission: RE | Admit: 2012-02-04 | Discharge: 2012-02-04 | Disposition: A | Discharge status: Home / Self Care | Payer: Managed Care, Other (non HMO) | Source: Ambulatory Visit | Attending: Gynecology | Admitting: Gynecology

## 2012-02-04 DIAGNOSIS — Z1231 Encounter for screening mammogram for malignant neoplasm of breast: Secondary | ICD-10-CM

## 2012-03-14 ENCOUNTER — Ambulatory Visit (INDEPENDENT_AMBULATORY_CARE_PROVIDER_SITE_OTHER): Payer: Managed Care, Other (non HMO) | Admitting: Internal Medicine

## 2012-03-14 ENCOUNTER — Encounter: Payer: Self-pay | Admitting: Internal Medicine

## 2012-03-14 VITALS — BP 120/70 | HR 85 | Temp 98.3°F | Ht 66.0 in | Wt 216.4 lb

## 2012-03-14 DIAGNOSIS — B070 Plantar wart: Secondary | ICD-10-CM

## 2012-03-14 DIAGNOSIS — J019 Acute sinusitis, unspecified: Secondary | ICD-10-CM

## 2012-03-14 DIAGNOSIS — J040 Acute laryngitis: Secondary | ICD-10-CM | POA: Diagnosis not present

## 2012-03-14 MED ORDER — LEVOFLOXACIN 500 MG PO TABS: 500.0000 mg | ORAL_TABLET | Freq: Every day | ORAL | Status: AC

## 2012-03-14 NOTE — Progress Notes (Signed)
  Subjective:    Patient ID: Amanda Wilkins, female    DOB: 09-02-1946, 66 y.o.   MRN: [id number]  Cough This is a new problem. The current episode started in the past 7 days. The problem has been gradually worsening. The cough is productive of sputum. Associated symptoms include ear congestion, ear pain, nasal congestion, postnasal drip, rhinorrhea and a sore throat. Pertinent negatives include no fever, headaches, rash, shortness of breath, weight loss or wheezing. The symptoms are aggravated by lying down. Risk factors for lung disease include travel. She has tried OTC cough suppressant for the symptoms. The treatment provided no relief.   Past Medical History  Diagnosis Date  . Allergy   . Atrial fibrillation   . Migraines     on Zoloft for migraines  . Hypertension   . Chronic interstitial nephritis   . Depression   . Palpitations   . Obesity   . Arthritis     ON BOTH KNEES     Review of Systems  Constitutional: Negative for fever and weight loss.  HENT: Positive for ear pain, sore throat, rhinorrhea and postnasal drip.   Respiratory: Positive for cough. Negative for shortness of breath and wheezing.   Skin: Negative for rash.  Neurological: Negative for headaches.       Objective:   Physical Exam BP 120/70  Pulse 85  Temp(Src) 98.3 F (36.8 C) (Oral)  Ht 5\' 6"  (1.676 m)  Wt 216 lb 6.4 oz (98.158 kg)  BMI 34.93 kg/m2  SpO2 97% Wt Readings from Last 3 Encounters:  03/14/12 216 lb 6.4 oz (98.158 kg)  12/26/11 215 lb (97.523 kg)  11/15/11 211 lb (95.709 kg)   Constitutional: She appears well-developed and well-nourished. No distress.  HENT: Head: Normocephalic and atraumatic. Ears: B TMs hazy, no erythema or effusion; Nose: Nose normal. Mouth/Throat: Oropharynx is clear and moist. No oropharyngeal exudate.  Eyes: Conjunctivae and EOM are normal. Pupils are equal, round, and reactive to light. No scleral icterus.  Neck: Normal range of motion. Neck supple. No JVD  present. No thyromegaly present.  Cardiovascular: Normal rate, regular rhythm and normal heart sounds.  No murmur heard. No BLE edema. Pulmonary/Chest: Effort normal and breath sounds normal. No respiratory distress. She has no wheezes.  Neurological: She is alert and oriented to person, place, and time. No cranial nerve deficit. Coordination normal.  Skin: Plantar wart L great toe sole -Skin is warm and dry. No rash noted. No erythema.  Psychiatric: She has a normal mood and affect. Her behavior is normal. Judgment and thought content normal.       Assessment & Plan:  sinusitis and laryngitis Plantar wart  Levaquin - rx cough suppression/decongestant Advised OTC wart tx and will refer to derm as per request

## 2012-03-14 NOTE — Patient Instructions (Signed)
It was good to see you today. Levaquin antibiotics -Your prescription(s) have been submitted to your pharmacy. Please take as directed and contact our office if you believe you are having problem(s) with the medication(s). we'll make referral to podiatry. Our office will contact you regarding appointment(s) once made. In meanwhile, try OTC wart pads as discussed Plantar Warts Plantar warts are growths on the bottom of your foot. Warts are caused by a germ.   HOME CARE  Soak your foot in warm water. Dry your foot when you are done. Remove the top layer of softened skin, then apply any medicine as told by your doctor.   Remove any bandages daily. File off extra wart tissue. Repeat this as told by your doctor until the wart goes away.   Only use medicine as told by your doctor.   Use a bandage with a hole in it (doughnut bandage) to relieve pain.Put the hole over the wart.   Wear shoes and socks and change them daily.   Keep your foot clean and dry.   Check your feet regularly.   Avoid contact with warts on other people.   Have your warts checked by your doctor.  GET HELP RIGHT AWAY IF: The treated skin becomes red, puffy (swollen), or painful. MAKE SURE YOU:  Understand these instructions.   Will watch your condition.   Will get help right away if you are not doing well or get worse.  Document Released: 11/03/2010 Document Revised: 09/20/2011 Document Reviewed: 11/03/2010 [facility] Patient Information 2012 [place], [place].

## 2012-03-24 ENCOUNTER — Ambulatory Visit: Payer: Medicare Other | Admitting: Internal Medicine

## 2012-03-24 DIAGNOSIS — D485 Neoplasm of uncertain behavior of skin: Secondary | ICD-10-CM | POA: Diagnosis not present

## 2012-03-24 DIAGNOSIS — M2060 Acquired deformities of toe(s), unspecified, unspecified foot: Secondary | ICD-10-CM | POA: Diagnosis not present

## 2012-03-24 DIAGNOSIS — M2010 Hallux valgus (acquired), unspecified foot: Secondary | ICD-10-CM | POA: Diagnosis not present

## 2012-03-27 ENCOUNTER — Ambulatory Visit (INDEPENDENT_AMBULATORY_CARE_PROVIDER_SITE_OTHER): Payer: Managed Care, Other (non HMO) | Admitting: Internal Medicine

## 2012-03-27 ENCOUNTER — Encounter: Payer: Self-pay | Admitting: Internal Medicine

## 2012-03-27 VITALS — BP 118/80 | HR 72 | Temp 97.3°F | Resp 16 | Ht 66.0 in | Wt 214.0 lb

## 2012-03-27 DIAGNOSIS — J309 Allergic rhinitis, unspecified: Secondary | ICD-10-CM

## 2012-03-27 NOTE — Patient Instructions (Addendum)
Rhinitis - this may be viral or possibly allergic. No evidence of bacterial infection at this time.  Plan Antihistamine for the post-nasal drainage - Loratadine 10 mg once a day  Low dose decongestant (low risk of increased heart rate, BP) - sudafed (generic) 30 mg twice a day or at most three times a day  Mucinex 1200 mg twice a day as a mucolytic to thin the secretions  Hydrate, tylenol for fever or aches, rest.   Allergic Rhinitis Allergic rhinitis is when the mucous membranes in the nose respond to allergens. Allergens are particles in the air that cause your body to have an allergic reaction. This causes you to release allergic antibodies. Through a chain of events, these eventually cause you to release histamine into the blood stream (hence the use of antihistamines). Although meant to be protective to the body, it is this release that causes your discomfort, such as frequent sneezing, congestion and an itchy runny nose.   CAUSES   The pollen allergens may come from grasses, trees, and weeds. This is seasonal allergic rhinitis, or "hay fever." Other allergens cause year-round allergic rhinitis (perennial allergic rhinitis) such as house dust mite allergen, pet dander and mold spores.   SYMPTOMS    Nasal stuffiness (congestion).   Runny, itchy nose with sneezing and tearing of the eyes.   There is often an itching of the mouth, eyes and ears.  It cannot be cured, but it can be controlled with medications. DIAGNOSIS   If you are unable to determine the offending allergen, skin or blood testing may find it. TREATMENT    Avoid the allergen.   Medications and allergy shots (immunotherapy) can help.   Hay fever may often be treated with antihistamines in pill or nasal spray forms. Antihistamines block the effects of histamine. There are over-the-counter medicines that may help with nasal congestion and swelling around the eyes. Check with your caregiver before taking or giving this  medicine.  If the treatment above does not work, there are many new medications your caregiver can prescribe. Stronger medications may be used if initial measures are ineffective. Desensitizing injections can be used if medications and avoidance fails. Desensitization is when a patient is given ongoing shots until the body becomes less sensitive to the allergen. Make sure you follow up with your caregiver if problems continue. SEEK MEDICAL CARE IF:    You develop fever (more than 100.5 F (38.1 C).   You develop a cough that does not stop easily (persistent).   You have shortness of breath.   You start wheezing.   Symptoms interfere with normal daily activities.  Document Released: 06/26/2001 Document Revised: 09/20/2011 Document Reviewed: 01/05/2009 [facility] Patient Information 2012 [place], [place].

## 2012-03-27 NOTE — Progress Notes (Signed)
  Subjective:    Patient ID: Amanda Wilkins, female    DOB: 15-Sep-1946, 66 y.o.   MRN: [id number]  HPI Mrs. Amanda Wilkins was seen two weeks ago diagnosied with URI treated with levaquin. She took most of her levaquin but stopped due to feeling "out of her skin." she continues to have a lot of sinus pressure an discomfort along with laryngitis. No fever, no chills, mucus is clear, no sore throat. No h/o allergies.   On going hair loss. She has seen Dr. Danella Deis for scalp issues.  PMH, FamHx and SocHx reviewed for any changes and relevance.    Review of Systems System review is negative for any constitutional, cardiac, pulmonary, GI or neuro symptoms or complaints other than as described in the HPI.     Objective:   Physical Exam Filed Vitals:   03/27/12 1141  BP: 118/80  Pulse: 72  Temp: 97.3 F (36.3 C)  Resp: 16          Assessment & Plan:  Rhinitis - this may be viral or possibly allergic. No evidence of bacterial infection at this time.  Plan Antihistamine for the post-nasal drainage - Loratadine 10 mg once a day  Low dose decongestant (low risk of increased heart rate, BP) - sudafed (generic) 30 mg twice a day or at most three times a day  Mucinex 1200 mg twice a day as a mucolytic to thin the secretions  Hydrate, tylenol for fever or aches, rest.

## 2012-04-14 ENCOUNTER — Telehealth: Payer: Self-pay

## 2012-04-14 NOTE — Telephone Encounter (Signed)
Patient may resume Premarin 0.3 mg daily with the addition of Provera 10 mg for 10 days of the month. If she does not have prescription refill please call in 3 months supply of each with 4 refills.

## 2012-04-14 NOTE — Telephone Encounter (Signed)
SHE STOPPED HRT [facility] 2013. C-O SEVERE HOT FLASHES AND SWEATING AND WANTS TO KNOW IF SHE CAN RESTART HRT AGAIN

## 2012-04-15 MED ORDER — MEDROXYPROGESTERONE ACETATE 10 MG PO TABS: 10.0000 mg | ORAL_TABLET | Freq: Every day | ORAL | Status: DC

## 2012-04-15 MED ORDER — ESTROGENS CONJUGATED 0.3 MG PO TABS: 0.3000 mg | ORAL_TABLET | Freq: Every day | ORAL | Status: DC

## 2012-04-15 NOTE — Telephone Encounter (Signed)
PT. NOTIFIED OF JF'S NOTE BELOW BUT WANTS MONTHLY RX'S NOT 90 DAY SUPPLY SO I SENT RX'S TO HER [facility].

## 2012-05-09 ENCOUNTER — Other Ambulatory Visit (INDEPENDENT_AMBULATORY_CARE_PROVIDER_SITE_OTHER): Payer: Medicare Other

## 2012-05-09 ENCOUNTER — Ambulatory Visit (INDEPENDENT_AMBULATORY_CARE_PROVIDER_SITE_OTHER): Payer: Medicare Other | Admitting: Internal Medicine

## 2012-05-09 ENCOUNTER — Encounter: Payer: Self-pay | Admitting: Internal Medicine

## 2012-05-09 VITALS — BP 120/84 | HR 80 | Temp 97.7°F | Resp 16 | Wt 212.0 lb

## 2012-05-09 DIAGNOSIS — H60509 Unspecified acute noninfective otitis externa, unspecified ear: Secondary | ICD-10-CM | POA: Diagnosis not present

## 2012-05-09 DIAGNOSIS — M545 Low back pain, unspecified: Secondary | ICD-10-CM | POA: Insufficient documentation

## 2012-05-09 DIAGNOSIS — H60549 Acute eczematoid otitis externa, unspecified ear: Secondary | ICD-10-CM

## 2012-05-09 LAB — URINALYSIS
Bilirubin Urine: NEGATIVE
Hgb urine dipstick: NEGATIVE
Nitrite: NEGATIVE
Total Protein, Urine: NEGATIVE
Urine Glucose: NEGATIVE
pH: 6.5 (ref 5.0–8.0)

## 2012-05-09 MED ORDER — CLOTRIMAZOLE-BETAMETHASONE 1-0.05 % EX CREA: TOPICAL_CREAM | Freq: Two times a day (BID) | CUTANEOUS | Status: DC

## 2012-05-09 NOTE — Progress Notes (Signed)
  Subjective:    Patient ID: Amanda Wilkins, female    DOB: [birth year], 66 y.o.   MRN: [id number]  HPI  C/o LBP x 2 d - spasm, severe - worse w/ROM; started after a work out  Review of Systems  Constitutional: Negative for fever and chills.  Cardiovascular: Negative for leg swelling.  Genitourinary: Negative for dysuria, urgency and hematuria.  Musculoskeletal: Positive for back pain.  Skin: Negative for rash.       Objective:   Physical Exam  Constitutional: She appears well-developed. No distress.  Cardiovascular: Normal rate.   Pulmonary/Chest: She has no wheezes. She has no rales.  Musculoskeletal: She exhibits tenderness.       Over R floating rib  Psychiatric: Judgment normal.          Assessment & Plan:

## 2012-05-10 NOTE — Assessment & Plan Note (Signed)
7/13 R MSK UA See Meds Xray if not better

## 2012-05-23 ENCOUNTER — Ambulatory Visit (INDEPENDENT_AMBULATORY_CARE_PROVIDER_SITE_OTHER): Payer: Medicare Other | Admitting: Cardiology

## 2012-05-23 ENCOUNTER — Encounter: Payer: Self-pay | Admitting: Cardiology

## 2012-05-23 VITALS — BP 122/66 | HR 78 | Ht 66.0 in | Wt 214.0 lb

## 2012-05-23 DIAGNOSIS — I4891 Unspecified atrial fibrillation: Secondary | ICD-10-CM | POA: Diagnosis not present

## 2012-05-23 DIAGNOSIS — R002 Palpitations: Secondary | ICD-10-CM | POA: Diagnosis not present

## 2012-05-23 MED ORDER — ATENOLOL 50 MG PO TABS: 25.0000 mg | ORAL_TABLET | Freq: Every day | ORAL | Status: DC

## 2012-05-23 NOTE — Assessment & Plan Note (Signed)
Doing well with minimal palpitations on conservative therapy. No change in medical therapy.

## 2012-05-23 NOTE — Progress Notes (Signed)
HPI  [name] returns today for evaluation and management of paroxysmal atrial fibrillation.  She's been doing remarkably well with only palpitations in the evening when she lays on her left side. They respond very well to an extra half of atenolol.   Past Medical History  Diagnosis Date  . Allergy   . Atrial fibrillation   . Migraines     on Zoloft for migraines  . Hypertension   . Chronic interstitial nephritis   . Depression   . Palpitations   . Obesity   . Arthritis     ON BOTH KNEES    Current Outpatient Prescriptions  Medication Sig Dispense Refill  . aspirin 325 MG tablet Take 325 mg by mouth daily.        [name] atenolol (TENORMIN) 50 MG tablet       . Cholecalciferol (VITAMIN D-3 PO) Take 2,000 mg by mouth daily.        . clotrimazole-betamethasone (LOTRISONE) cream Apply topically 2 (two) times daily.  30 g  1  . diltiazem (DILACOR XR) 180 MG 24 hr capsule Take 1 capsule (180 mg total) by mouth daily.  90 capsule  3  . estrogens, conjugated, (PREMARIN) 0.3 MG tablet Take 1 tablet (0.3 mg total) by mouth daily.  30 tablet  11  . medroxyPROGESTERone (PROVERA) 10 MG tablet Take 1 tablet (10 mg total) by mouth daily. FOR DAYS 1-10 OF EVERY MONTH  10 tablet  11  . METROGEL 1 % gel       . sertraline (ZOLOFT) 50 MG tablet TAKE 1 TABLET BY MOUTH DAILY  30 tablet  5  . DISCONTD: atenolol (TENORMIN) 50 MG tablet TAKE 1 TABLET BY MOUTH DAILY  30 tablet  5    Allergies  Allergen Reactions  . Aspirin   . Penicillins     She took amoxicillin in 2012-2013 and was ok  . Epinephrine Palpitations    Family History  Problem Relation Age of Onset  . Cancer Brother     PANCREATIC    History   Social History  . Marital Status: Married    Spouse Name: N/A    Number of Children: N/A  . Years of Education: N/A   Occupational History  . Not on file.   Social History Main Topics  . Smoking status: Never Smoker   . Smokeless tobacco: Never Used  . Alcohol Use: 8.4  oz/week    14 Glasses of wine per week     WINE- TWO GLASSES AT [employer]  . Drug Use: No  . Sexually Active: Yes   Other Topics Concern  . Not on file   Social History Narrative  . No narrative on file    ROS ALL NEGATIVE EXCEPT THOSE NOTED IN HPI  PE  General Appearance: well developed, well nourished in no acute distress, obese HEENT: symmetrical face, PERRLA, good dentition  Neck: no JVD, thyromegaly, or adenopathy, trachea midline Chest: symmetric without deformity Cardiac: PMI poorly appreciated, RRR, normal S1, S2, no gallop or murmur Lung: clear to ausculation and percussion Vascular: all pulses full without bruits  Abdominal: nondistended, nontender, good bowel sounds, no HSM, no bruits Extremities: no cyanosis, clubbing or edema, no sign of DVT, no varicosities  Skin: normal color, no rashes Neuro: alert and oriented x 3, non-focal Pysch: normal affect  EKG Normal sinus rhythm, normal EKG BMET    Component Value Date/Time   NA 134* 11/08/2009 0029   K 5.4* 11/08/2009 7846  CL 99 11/08/2009 0029   CO2 20 11/08/2009 0029   GLUCOSE 93 11/08/2009 0029   BUN 14 11/08/2009 0029   CREATININE 0.81 11/08/2009 0029   CALCIUM 9.2 11/08/2009 0029    Lipid Panel     Component Value Date/Time   CHOL 173 04/16/2009 0020   TRIG 72 04/16/2009 0020   HDL 85 04/16/2009 0020   CHOLHDL 2.0 Ratio 04/16/2009 0020   VLDL 14 04/16/2009 0020   LDLCALC 74 04/16/2009 0020    CBC    Component Value Date/Time   WBC 6.6 06/11/2011 1533   RBC 4.02 06/11/2011 1533   HGB 13.4 06/11/2011 1533   HCT 39.0 06/11/2011 1533   PLT 200 06/11/2011 1533   MCV 97.0 06/11/2011 1533   MCH 33.3 06/11/2011 1533   MCHC 34.4 06/11/2011 1533   RDW 13.1 06/11/2011 1533   LYMPHSABS 2.6 06/11/2011 1533   MONOABS 0.3 06/11/2011 1533   EOSABS 0.2 06/11/2011 1533   BASOSABS 0.0 06/11/2011 1533

## 2012-05-23 NOTE — Patient Instructions (Addendum)
Your physician recommends that you continue on your current medications as directed. Please refer to the Current Medication list given to you today.   Your physician wants you to follow-up in: 1 year with Dr. Wall. You will receive a reminder letter in the mail two months in advance. If you don't receive a letter, please call our office to schedule the follow-up appointment.  

## 2012-05-25 ENCOUNTER — Other Ambulatory Visit: Payer: Self-pay | Admitting: Cardiology

## 2012-06-09 ENCOUNTER — Ambulatory Visit (INDEPENDENT_AMBULATORY_CARE_PROVIDER_SITE_OTHER): Payer: Medicare Other | Admitting: Internal Medicine

## 2012-06-09 ENCOUNTER — Encounter: Payer: Self-pay | Admitting: Internal Medicine

## 2012-06-09 VITALS — BP 110/70 | HR 72 | Temp 97.7°F | Wt 214.0 lb

## 2012-06-09 DIAGNOSIS — K121 Other forms of stomatitis: Secondary | ICD-10-CM | POA: Insufficient documentation

## 2012-06-09 DIAGNOSIS — J399 Disease of upper respiratory tract, unspecified: Secondary | ICD-10-CM

## 2012-06-09 DIAGNOSIS — K1370 Unspecified lesions of oral mucosa: Secondary | ICD-10-CM | POA: Diagnosis not present

## 2012-06-09 DIAGNOSIS — R51 Headache: Secondary | ICD-10-CM | POA: Diagnosis not present

## 2012-06-09 DIAGNOSIS — J398 Other specified diseases of upper respiratory tract: Secondary | ICD-10-CM

## 2012-06-09 MED ORDER — TRIAMCINOLONE ACETONIDE 0.1 % MT PSTE: PASTE | Freq: Three times a day (TID) | OROMUCOSAL | Status: DC

## 2012-06-09 MED ORDER — ACYCLOVIR 400 MG PO TABS: 400.0000 mg | ORAL_TABLET | Freq: Three times a day (TID) | ORAL | Status: AC

## 2012-06-09 NOTE — Assessment & Plan Note (Signed)
Empiric Acyclovir Kenalog paste tid prn

## 2012-06-09 NOTE — Assessment & Plan Note (Signed)
Call if not better 

## 2012-06-09 NOTE — Progress Notes (Signed)
Patient ID: Amanda Wilkins, female   DOB: 1946/06/16, 66 y.o.   MRN: [id number]  Subjective:    Patient ID: Amanda Wilkins, female    DOB: 1946/07/30, 66 y.o.   MRN: [id number]  Mouth Lesions  The current episode started 3 to 5 days ago. The onset was gradual. The problem occurs continuously. The problem is moderate. The symptoms are aggravated by eating. Associated symptoms include headaches, mouth sores and swollen glands. Pertinent negatives include no fever, no ear discharge, no ear pain, no cough, no rash and no eye discharge.    C/o LBP x 2 d - spasm, severe - worse w/ROM; started after a work out  Review of Systems  Constitutional: Positive for fatigue. Negative for fever and chills.  HENT: Positive for mouth sores. Negative for ear pain and ear discharge.   Eyes: Negative for discharge.  Respiratory: Negative for cough.   Cardiovascular: Negative for leg swelling.  Genitourinary: Negative for dysuria, urgency and hematuria.  Musculoskeletal: Negative for back pain.  Skin: Negative for rash.  Neurological: Positive for headaches.       Objective:   Physical Exam  Constitutional: She appears well-developed. No distress.  HENT:       L lower gum with a 10x3 mm ulcer  Cardiovascular: Normal rate.   No murmur heard. Pulmonary/Chest: She has no wheezes. She has no rales.  Musculoskeletal: She exhibits no tenderness.  Skin: No rash noted.  Psychiatric: Judgment normal.          Assessment & Plan:

## 2012-06-11 DIAGNOSIS — M898X9 Other specified disorders of bone, unspecified site: Secondary | ICD-10-CM | POA: Diagnosis not present

## 2012-06-11 DIAGNOSIS — M6240 Contracture of muscle, unspecified site: Secondary | ICD-10-CM | POA: Diagnosis not present

## 2012-06-11 DIAGNOSIS — D485 Neoplasm of uncertain behavior of skin: Secondary | ICD-10-CM | POA: Diagnosis not present

## 2012-07-24 ENCOUNTER — Telehealth: Payer: Self-pay | Admitting: *Deleted

## 2012-07-24 MED ORDER — SERTRALINE HCL 50 MG PO TABS: 50.0000 mg | ORAL_TABLET | Freq: Every day | ORAL | Status: DC

## 2012-07-24 NOTE — Telephone Encounter (Signed)
Pt left message stating her pharmacy has been faxing [place] for her refill since last week. I have not seen or received any requests for her. I called pt to inform her of this and to see what med it is she is needing. No answer/Left mess for patient to call back.

## 2012-07-24 NOTE — Telephone Encounter (Signed)
Pt called back stating she needed zoloft. Rf sent. Pt informed

## 2012-07-24 NOTE — Addendum Note (Signed)
Addended by: Merrilyn Puma on: 07/24/2012 10:22 AM   Modules accepted: Orders, Medications

## 2012-09-01 ENCOUNTER — Other Ambulatory Visit: Payer: Self-pay | Admitting: Dermatology

## 2012-09-01 DIAGNOSIS — L659 Nonscarring hair loss, unspecified: Secondary | ICD-10-CM | POA: Diagnosis not present

## 2012-09-01 DIAGNOSIS — L658 Other specified nonscarring hair loss: Secondary | ICD-10-CM | POA: Diagnosis not present

## 2012-09-01 DIAGNOSIS — L219 Seborrheic dermatitis, unspecified: Secondary | ICD-10-CM | POA: Diagnosis not present

## 2012-09-20 DIAGNOSIS — I10 Essential (primary) hypertension: Secondary | ICD-10-CM | POA: Diagnosis not present

## 2012-09-20 DIAGNOSIS — R002 Palpitations: Secondary | ICD-10-CM | POA: Diagnosis not present

## 2012-09-20 DIAGNOSIS — Z8679 Personal history of other diseases of the circulatory system: Secondary | ICD-10-CM | POA: Diagnosis not present

## 2012-09-23 ENCOUNTER — Ambulatory Visit: Payer: Medicare Other

## 2012-09-23 ENCOUNTER — Ambulatory Visit (INDEPENDENT_AMBULATORY_CARE_PROVIDER_SITE_OTHER): Payer: Medicare Other | Admitting: Nurse Practitioner

## 2012-09-23 ENCOUNTER — Encounter: Payer: Self-pay | Admitting: Nurse Practitioner

## 2012-09-23 VITALS — BP 110/76 | HR 72 | Ht 66.0 in | Wt 220.1 lb

## 2012-09-23 DIAGNOSIS — R002 Palpitations: Secondary | ICD-10-CM

## 2012-09-23 NOTE — Progress Notes (Signed)
Amanda Wilkins Date of Birth: June 04, 1946 Medical Record #[id number]  History of Present Illness: Amanda Wilkins is seen back today for a work in visit. She is seen for Dr. Daleen Squibb. She has a history of obesity and PAF. She has been maintained on chronic BB and CCB therapy.   She comes in today. She is here alone. She has been doing well for the most part. Has not had a recurrent spell of PAF in several years. This past Friday night she was visiting her son down in [place]. She awoke from her sleep with her heart racing and feeling hot all over. She got scared and thought the spell would "turn into atrial fib". Family took her to [place]. EKG showed sinus rhythm. Her labs were normal. She was discharged with follow up here. No real trigger noted. She does not use caffeine. She does have fairly regular alcohol use. She did not take any additional medicines. Last echo was in 2009 and showed normal EF.   She has since been ok. She does not have chest pain. She exercises about 2 x a week. Not a lot of aerobic activity. Very frustrated about her weight. It continues to climb.   Current Outpatient Prescriptions on File Prior to Visit  Medication Sig Dispense Refill  . aspirin 325 MG tablet Take 325 mg by mouth daily.        . Cholecalciferol (VITAMIN D-3 PO) Take 2,000 mg by mouth daily.        [name] diltiazem (DILACOR XR) 180 MG 24 hr capsule TAKE ONE CAPSULE BY MOUTH DAILY  90 capsule  3  . estrogens, conjugated, (PREMARIN) 0.3 MG tablet Take 1 tablet (0.3 mg total) by mouth daily.  30 tablet  11  . medroxyPROGESTERone (PROVERA) 10 MG tablet Take 1 tablet (10 mg total) by mouth daily. FOR DAYS 1-10 OF EVERY MONTH  10 tablet  11  . METROGEL 1 % gel Apply 1 application topically as needed.       . sertraline (ZOLOFT) 50 MG tablet Take 1 tablet (50 mg total) by mouth daily.  30 tablet  5  . [DISCONTINUED] atenolol (TENORMIN) 50 MG tablet Take 0.5 tablets (25 mg total) by mouth daily.  30 tablet  12     Allergies  Allergen Reactions  . Aspirin   . Penicillins     She took amoxicillin in 2012-2013 and was ok  . Epinephrine Palpitations    Past Medical History  Diagnosis Date  . Allergy   . Atrial fibrillation   . Migraines     on Zoloft for migraines  . Hypertension   . Chronic interstitial nephritis   . Depression   . Palpitations   . Obesity   . Arthritis     ON BOTH KNEES    Past Surgical History  Procedure Date  . Cesarean section     History  Smoking status  . Never Smoker   Smokeless tobacco  . Never Used    History  Alcohol Use  . 8.4 oz/week  . 14 Glasses of wine per week    Comment: WINE- TWO GLASSES AT [employer]INNER    Family History  Problem Relation Age of Onset  . Cancer Brother     PANCREATIC    Review of Systems: The review of systems is per the HPI.  All other systems were reviewed and are negative.  Physical Exam: BP 110/76  Pulse 72  Ht 5\' 6"  (1.676 m)  Wt 220 lb  1.9 oz (99.846 kg)  BMI 35.53 kg/m2 Patient is very pleasant and in no acute distress. She is obese. Skin is warm and dry. Color is normal.  HEENT is unremarkable. Normocephalic/atraumatic. PERRL. Sclera are nonicteric. Neck is supple. No masses. No JVD. Lungs are clear. Cardiac exam shows a regular rate and rhythm. Abdomen is soft. Extremities are without edema. Gait and ROM are intact. No gross neurologic deficits noted.  LABORATORY DATA: Reviewed    Assessment / Plan: 1. Lone episode of palpitations - no atrial fib documented.   2. Obesity  I have encouraged her to use an extra dose of Diltiazem if she has another spell of the heart racing. If she starts having recurrence, we will need further testing and possible adjustment of her medicines. I think exercise and weight loss are key for her to do well. We have discussed this in depth and she seems very motivated to make changes. We have discussed my 5 step method for weight loss which includes increasing her exercise. I  have asked her to cut her alcohol use at least in half. We will tentatively see her back at her regular visit with Dr. Daleen Squibb in August.   Patient is agreeable to this plan and will call if any problems develop in the interim.

## 2012-09-23 NOTE — Patient Instructions (Addendum)
Stay on your current medicines for now  If you have recurrent spells of fast heart beating, I suggest taking an extra dose of your Diltiazem  We will need to do more testing/change medicines if you have recurrent spells   Here are my tips to lose weight:  1. Drink only water. You do not need milk, juice, tea, soda or diet soda.  2. Do not eat anything "white". This includes white bread, potatoes, rice or mayo  3. Stay away from fried foods and sweets  4. Your portion should be the size of the palm of your hand.  5. Know what your weaknesses are and avoid.  6. Find an exercise you like and do it every day for 45 to 60 minutes.   Call the [facility] office at [phone] if you have any questions, problems or concerns.

## 2012-09-24 ENCOUNTER — Ambulatory Visit (INDEPENDENT_AMBULATORY_CARE_PROVIDER_SITE_OTHER): Payer: Medicare Other

## 2012-09-24 DIAGNOSIS — Z23 Encounter for immunization: Secondary | ICD-10-CM

## 2012-11-06 DIAGNOSIS — Z79899 Other long term (current) drug therapy: Secondary | ICD-10-CM | POA: Diagnosis not present

## 2012-11-06 DIAGNOSIS — L439 Lichen planus, unspecified: Secondary | ICD-10-CM | POA: Diagnosis not present

## 2012-11-17 DIAGNOSIS — M069 Rheumatoid arthritis, unspecified: Secondary | ICD-10-CM | POA: Diagnosis not present

## 2012-11-17 DIAGNOSIS — Z79899 Other long term (current) drug therapy: Secondary | ICD-10-CM | POA: Diagnosis not present

## 2012-11-25 DIAGNOSIS — H2510 Age-related nuclear cataract, unspecified eye: Secondary | ICD-10-CM | POA: Diagnosis not present

## 2012-11-25 DIAGNOSIS — H16229 Keratoconjunctivitis sicca, not specified as Sjogren's, unspecified eye: Secondary | ICD-10-CM | POA: Diagnosis not present

## 2012-12-18 DIAGNOSIS — L439 Lichen planus, unspecified: Secondary | ICD-10-CM | POA: Diagnosis not present

## 2012-12-18 DIAGNOSIS — Z79899 Other long term (current) drug therapy: Secondary | ICD-10-CM | POA: Diagnosis not present

## 2012-12-29 ENCOUNTER — Encounter: Payer: Medicare Other | Admitting: Gynecology

## 2013-01-06 ENCOUNTER — Ambulatory Visit (INDEPENDENT_AMBULATORY_CARE_PROVIDER_SITE_OTHER): Payer: Medicare Other | Admitting: Gynecology

## 2013-01-06 ENCOUNTER — Encounter: Payer: Self-pay | Admitting: Gynecology

## 2013-01-06 VITALS — BP 124/78 | Ht 64.75 in | Wt 221.0 lb

## 2013-01-06 DIAGNOSIS — R635 Abnormal weight gain: Secondary | ICD-10-CM | POA: Insufficient documentation

## 2013-01-06 DIAGNOSIS — Z8742 Personal history of other diseases of the female genital tract: Secondary | ICD-10-CM | POA: Diagnosis not present

## 2013-01-06 DIAGNOSIS — Z23 Encounter for immunization: Secondary | ICD-10-CM

## 2013-01-06 DIAGNOSIS — Z124 Encounter for screening for malignant neoplasm of cervix: Secondary | ICD-10-CM | POA: Diagnosis not present

## 2013-01-06 DIAGNOSIS — Z79890 Hormone replacement therapy (postmenopausal): Secondary | ICD-10-CM | POA: Diagnosis not present

## 2013-01-06 DIAGNOSIS — N951 Menopausal and female climacteric states: Secondary | ICD-10-CM

## 2013-01-06 DIAGNOSIS — N950 Postmenopausal bleeding: Secondary | ICD-10-CM | POA: Diagnosis not present

## 2013-01-06 MED ORDER — ESTROGENS CONJUGATED 0.3 MG PO TABS: 0.3000 mg | ORAL_TABLET | Freq: Every day | ORAL | Status: DC

## 2013-01-06 MED ORDER — MEDROXYPROGESTERONE ACETATE 10 MG PO TABS: ORAL_TABLET | ORAL | Status: DC

## 2013-01-06 NOTE — Progress Notes (Addendum)
Amanda Wilkins 12-Feb-1946 [id number]   History:    67 y.o.  Who presented to the office today for GYN exam but was also stating that last week she had right upper quadrant discomfort after she had been drinking some wine. She stated her symptoms haven't improved. She denies any change in bowel movements or urination. Her brother had pancreatic cancer at age 70. Patient's last colonoscopy was in 2012 which was normal.  Dr. Danella Deis dermatologist has been following her for her alopecia and due to the fact that she's on Plaquenil she is doing her lab work as well as her liver function test. Her primary physician is Dr. Posey Rea. Patient last bone density study in 2011 which was normal. She currently is on Premarin 0.3 mg daily with the addition of Provera 10 mg for 10 days of each month. Patient had ascus on Pap smear in 2008 and negative HPV. Her followup Pap smears have been negative Review of patient's records indicated that since 2009 she's had a persistent small thinwall echo-free cyst measuring 15 x 11 mm. . Past medical history,surgical history, family history and social history were all reviewed and documented in the EPIC chart.  Gynecologic History No LMP recorded. Patient is postmenopausal. Contraception: post menopausal status Last Pap: 2012. Results were: normal Last mammogram: April 2013. Results were: normal  Obstetric History OB History   Grav Para Term Preterm Abortions TAB SAB Ect Mult Living   2 2 2       2      # Outc Date GA Lbr Len/2nd Wgt Sex Del Anes PTL Lv   1 TRM     M CS  No Yes   2 TRM     M SVD  No Yes       ROS: A ROS was performed and pertinent positives and negatives are included in the history.  GENERAL: No fevers or chills. HEENT: No change in vision, no earache, sore throat or sinus congestion. NECK: No pain or stiffness. CARDIOVASCULAR: No chest pain or pressure. No palpitations. PULMONARY: No shortness of breath, cough or wheeze. GASTROINTESTINAL: No  abdominal pain, nausea, vomiting or diarrhea, melena or bright red blood per rectum. GENITOURINARY: No urinary frequency, urgency, hesitancy or dysuria. MUSCULOSKELETAL: No joint or muscle pain, no back pain, no recent trauma. DERMATOLOGIC: No rash, no itching, no lesions. ENDOCRINE: No polyuria, polydipsia, no heat or cold intolerance. No recent change in weight. HEMATOLOGICAL: No anemia or easy bruising or bleeding. NEUROLOGIC: No headache, seizures, numbness, tingling or weakness. PSYCHIATRIC: No depression, no loss of interest in normal activity or change in sleep pattern.     Exam: chaperone present  BP 124/78  Ht 5' 4.75" (1.645 m)  Wt 221 lb (100.245 kg)  BMI 37.05 kg/m2  Body mass index is 37.05 kg/(m^2).  General appearance : Well developed well nourished female. No acute distress HEENT: Neck supple, trachea midline, no carotid bruits, no thyroidmegaly Lungs: Clear to auscultation, no rhonchi or wheezes, or rib retractions  Heart: Regular rate and rhythm, no murmurs or gallops Breast:Examined in sitting and supine position were symmetrical in appearance, no palpable masses or tenderness,  no skin retraction, no nipple inversion, no nipple discharge, no skin discoloration, no axillary or supraclavicular lymphadenopathy Abdomen: no palpable masses or tenderness, no rebound or guarding Extremities: no edema or skin discoloration or tenderness  Pelvic:  Bartholin, Urethra, Skene Glands: Within normal limits             Vagina: No gross lesions  or discharge, atrophic changes  Cervix: No gross lesions or discharge  Uterus  anteverted, normal size, shape and consistency, non-tender and mobile  Adnexa  Without masses or tenderness  Anus and perineum  normal   Rectovaginal  normal sphincter tone without palpated masses or tenderness             Hemoccult cards provided     Assessment/Plan:  67 y.o. female for annual exam will return back to the office next week to followup on the  small simple-appearing cyst on the left ovary that has been stable since 2009. No Pap smear was done today. The new guidelines were discussed. She did receive the Tdap vaccine as well as literature information. She was provided with the Hemoccult cards for testing to submit to the office at a later date for testing. She was reminded to do her monthly breast exams and to followup with her mammogram in April. We discussed the importance of calcium and vitamin D for osteoporosis prevention. She will schedule her bone density study for sometime in May. Patient was once again counseled on the risk of hormone replacement therapy and breast cancer and she fully accepts.    Ok Edwards MD, 2:19 PM 01/06/2013

## 2013-01-06 NOTE — Patient Instructions (Addendum)

## 2013-01-12 ENCOUNTER — Telehealth: Payer: Self-pay | Admitting: *Deleted

## 2013-01-12 NOTE — Telephone Encounter (Signed)
Pt was given T-Dap shot on 01/06/13 and pt said that her arm is very much sore in that area. She states it seems to get more sore as days go by. Pt asked if this normal? Please advise

## 2013-01-12 NOTE — Telephone Encounter (Signed)
Left the below note on pt voicemail. 

## 2013-01-12 NOTE — Telephone Encounter (Signed)
Sometimes a mild irritated area the skin after injection can occur. If the area is not erythematous this will subside she can put ice on it but if there is any questions have her come by the office and I will be more than glad to look at it

## 2013-01-13 ENCOUNTER — Other Ambulatory Visit: Payer: Self-pay

## 2013-01-13 DIAGNOSIS — Z1231 Encounter for screening mammogram for malignant neoplasm of breast: Secondary | ICD-10-CM

## 2013-01-15 ENCOUNTER — Other Ambulatory Visit: Payer: Self-pay | Admitting: Internal Medicine

## 2013-01-16 ENCOUNTER — Encounter: Payer: Self-pay | Admitting: Gynecology

## 2013-01-16 ENCOUNTER — Ambulatory Visit (INDEPENDENT_AMBULATORY_CARE_PROVIDER_SITE_OTHER): Payer: Medicare Other

## 2013-01-16 ENCOUNTER — Ambulatory Visit (INDEPENDENT_AMBULATORY_CARE_PROVIDER_SITE_OTHER): Payer: Medicare Other | Admitting: Gynecology

## 2013-01-16 DIAGNOSIS — T881XXA Other complications following immunization, not elsewhere classified, initial encounter: Secondary | ICD-10-CM

## 2013-01-16 DIAGNOSIS — R9389 Abnormal findings on diagnostic imaging of other specified body structures: Secondary | ICD-10-CM

## 2013-01-16 DIAGNOSIS — N83339 Acquired atrophy of ovary and fallopian tube, unspecified side: Secondary | ICD-10-CM

## 2013-01-16 DIAGNOSIS — R1032 Left lower quadrant pain: Secondary | ICD-10-CM | POA: Diagnosis not present

## 2013-01-16 DIAGNOSIS — N83202 Unspecified ovarian cyst, left side: Secondary | ICD-10-CM

## 2013-01-16 DIAGNOSIS — N83209 Unspecified ovarian cyst, unspecified side: Secondary | ICD-10-CM

## 2013-01-16 DIAGNOSIS — Z8742 Personal history of other diseases of the female genital tract: Secondary | ICD-10-CM

## 2013-01-16 MED ORDER — PREDNISONE (PAK) 10 MG PO TABS: 10.0000 mg | ORAL_TABLET | Freq: Every day | ORAL | Status: DC

## 2013-01-16 NOTE — Patient Instructions (Addendum)
Ovarian Cyst The ovaries are small organs that are on each side of the uterus. The ovaries are the organs that produce the female hormones, estrogen and progesterone. An ovarian cyst is a sac filled with fluid that can vary in its size. It is normal for a small cyst to form in women who are in the childbearing age and who have menstrual periods. This type of cyst is called a follicle cyst that becomes an ovulation cyst (corpus luteum cyst) after it produces the women's egg. It later goes away on its own if the woman does not become pregnant. There are other kinds of ovarian cysts that may cause problems and may need to be treated. The most serious problem is a cyst with cancer. It should be noted that menopausal women who have an ovarian cyst are at a higher risk of it being a cancer cyst. They should be evaluated very quickly, thoroughly and followed closely. This is especially true in menopausal women because of the high rate of ovarian cancer in women in menopause. CAUSES AND TYPES OF OVARIAN CYSTS:  FUNCTIONAL CYST: The follicle/corpus luteum cyst is a functional cyst that occurs every month during ovulation with the menstrual cycle. They go away with the next menstrual cycle if the woman does not get pregnant. Usually, there are no symptoms with a functional cyst.  ENDOMETRIOMA CYST: This cyst develops from the lining of the uterus tissue. This cyst gets in or on the ovary. It grows every month from the bleeding during the menstrual period. It is also called a "chocolate cyst" because it becomes filled with blood that turns brown. This cyst can cause pain in the lower abdomen during intercourse and with your menstrual period.  CYSTADENOMA CYST: This cyst develops from the cells on the outside of the ovary. They usually are not cancerous. They can get very big and cause lower abdomen pain and pain with intercourse. This type of cyst can twist on itself, cut off its blood supply and cause severe pain. It  also can easily rupture and cause a lot of pain.  DERMOID CYST: This type of cyst is sometimes found in both ovaries. They are found to have different kinds of body tissue in the cyst. The tissue includes skin, teeth, hair, and/or cartilage. They usually do not have symptoms unless they get very big. Dermoid cysts are rarely cancerous.  POLYCYSTIC OVARY: This is a rare condition with hormone problems that produces many small cysts on both ovaries. The cysts are follicle-like cysts that never produce an egg and become a corpus luteum. It can cause an increase in body weight, infertility, acne, increase in body and facial hair and lack of menstrual periods or rare menstrual periods. Many women with this problem develop type 2 diabetes. The exact cause of this problem is unknown. A polycystic ovary is rarely cancerous.  THECA LUTEIN CYST: Occurs when too much hormone (human chorionic gonadotropin) is produced and over-stimulates the ovaries to produce an egg. They are frequently seen when doctors stimulate the ovaries for invitro-fertilization (test tube babies).  LUTEOMA CYST: This cyst is seen during pregnancy. Rarely it can cause an obstruction to the birth canal during labor and delivery. They usually go away after delivery. SYMPTOMS   Pelvic pain or pressure.  Pain during sexual intercourse.  Increasing girth (swelling) of the abdomen.  Abnormal menstrual periods.  Increasing pain with menstrual periods.  You stop having menstrual periods and you are not pregnant. DIAGNOSIS  The diagnosis can   be made during:  Routine or annual pelvic examination (common).  Ultrasound.  X-ray of the pelvis.  CT Scan.  MRI.  Blood tests. TREATMENT   Treatment may only be to follow the cyst monthly for 2 to 3 months with your caregiver. Many go away on their own, especially functional cysts.  May be aspirated (drained) with a long needle with ultrasound, or by laparoscopy (inserting a tube into  the pelvis through a small incision).  The whole cyst can be removed by laparoscopy.  Sometimes the cyst may need to be removed through an incision in the lower abdomen.  Hormone treatment is sometimes used to help dissolve certain cysts.  Birth control pills are sometimes used to help dissolve certain cysts. HOME CARE INSTRUCTIONS  Follow your caregiver's advice regarding:  Medicine.  Follow up visits to evaluate and treat the cyst.  You may need to come back or make an appointment with another caregiver, to find the exact cause of your cyst, if your caregiver is not a gynecologist.  Get your yearly and recommended pelvic examinations and Pap tests.  Let your caregiver know if you have had an ovarian cyst in the past. SEEK MEDICAL CARE IF:   Your periods are late, irregular, they stop, or are painful.  Your stomach (abdomen) or pelvic pain does not go away.  Your stomach becomes larger or swollen.  You have pressure on your bladder or trouble emptying your bladder completely.  You have painful sexual intercourse.  You have feelings of fullness, pressure, or discomfort in your stomach.  You lose weight for no apparent reason.  You feel generally ill.  You become constipated.  You lose your appetite.  You develop acne.  You have an increase in body and facial hair.  You are gaining weight, without changing your exercise and eating habits.  You think you are pregnant. SEEK IMMEDIATE MEDICAL CARE IF:   You have increasing abdominal pain.  You feel sick to your stomach (nausea) and/or vomit.  You develop a fever that comes on suddenly.  You develop abdominal pain during a bowel movement.  Your menstrual periods become heavier than usual. Document Released: 10/01/2005 Document Revised: 12/24/2011 Document Reviewed: 08/04/2009 Exit[employer] Patient Information 2013 Gladys Damme

## 2013-01-16 NOTE — Progress Notes (Signed)
The patient presented to the office today for an ultrasound to followup on  left ovarian cyst as well as postmenopausal bleeding. She currently is on Premarin 0.3 mg daily with the addition of Provera 10 mg for 10 days of each month. Since 2009 she's had a persistent small thinwall echo-free cyst measuring 15 x 11 mm which has been stable since 2008. When she was in the office on March 25 she received a Tdap vaccine on her right deltoid and stated it is felt indurated and tender since then.  Exam: A slightly tender indurated area where patient received a Tdap vaccination but not erythematous.  Ultrasound Uterus measured 6.3 x 3.8 x 2.8 cm with endometrial stripe of 5.7 mm (slightly thickened) right ovary appears to be normal. Left ovarian echo-free thinwall avascular cyst measuring 11 x 10 x 9 mm present and no free fluid was seen in the cul-de-sac  Assessment/plan: Patient with postmenopausal irregular bleeding thickened endometrium on hormone replacement therapy did not want to have an endometrial biopsy today and was to reschedule for 2 weeks from now. Because of the persistent small avascular echo-free thinwall cyst we will check a CA 125 today. For mild inflammation from her injection site from her Tdap vaccine I'm going to place her on a Sterapred (uni-pack) 10 mg tablet to take over 6 days, warm heating pad twice a day was recommended.

## 2013-02-09 ENCOUNTER — Ambulatory Visit
Admission: RE | Admit: 2013-02-09 | Discharge: 2013-02-09 | Disposition: A | Discharge status: Home / Self Care | Payer: Medicare Other | Source: Ambulatory Visit

## 2013-02-09 DIAGNOSIS — Z1231 Encounter for screening mammogram for malignant neoplasm of breast: Secondary | ICD-10-CM

## 2013-02-10 ENCOUNTER — Encounter: Payer: Self-pay | Admitting: Gynecology

## 2013-02-10 ENCOUNTER — Ambulatory Visit (INDEPENDENT_AMBULATORY_CARE_PROVIDER_SITE_OTHER): Payer: Medicare Other | Admitting: Gynecology

## 2013-02-10 ENCOUNTER — Other Ambulatory Visit: Payer: Self-pay | Admitting: Gynecology

## 2013-02-10 DIAGNOSIS — N951 Menopausal and female climacteric states: Secondary | ICD-10-CM | POA: Diagnosis not present

## 2013-02-10 DIAGNOSIS — N859 Noninflammatory disorder of uterus, unspecified: Secondary | ICD-10-CM | POA: Diagnosis not present

## 2013-02-10 DIAGNOSIS — N950 Postmenopausal bleeding: Secondary | ICD-10-CM | POA: Diagnosis not present

## 2013-02-10 DIAGNOSIS — Z23 Encounter for immunization: Secondary | ICD-10-CM | POA: Diagnosis not present

## 2013-02-10 DIAGNOSIS — Z124 Encounter for screening for malignant neoplasm of cervix: Secondary | ICD-10-CM | POA: Diagnosis not present

## 2013-02-10 NOTE — Progress Notes (Signed)
Patient presented to the office today for an endometrial biopsybecause of some irregular bleeding over the past 2 months while on hormone replacement therapy (Premarin 0.3 mg daily with the addition of Provera 10 mg for 10 days of the month.   Since 2009 she's had a persistent small thinwall echo-free cyst measuring 15 x 11 mm which has been stable since 2008. She had an ultrasound in the office on April 4 of the following result:  Uterus measured 6.3 x 3.8 x 2.8 cm with endometrial stripe of 5.7 mm (slightly thickened) right ovary appears to be normal. Left ovarian echo-free thinwall avascular cyst measuring 11 x 10 x 9 mm present and no free fluid was seen in the cul-de-sac  Ca- 125 was ordered and was in the normal range with a value of 4.9.  Because of her slightly thickened endometrium she was counseled for an endometrial biopsy in the office today. Speculum was placed in the vagina. The cervix was cleansed with Betadine solution. A single-tooth tenaculum was placed on the anterior cervical lip. A Pipelle was introduced into the uterine cavity which sounded to 6-1/2 cm. Very little endometrial tissue was obtained and was submitted for histological evaluation. The single-tooth tenaculum was removed and she was given an Aleve for relief of discomfort.  Assessment/plan: Patient with 2 months of isolated spotting on hormone replacement therapy with a slightly thickened endometrium. Patient underwent endometrial biopsy today with minimal tissue obtained results pending at time of this dictation from pathology. Patient will continue on her low dose Premarin 0.3 mg daily with the addition of Provera 10 mg for 10 days of the month. She will maintain a menstrual calendar if her cycles did not regulate over the next 3-4 months she will return back to the office for further evaluation. Once again we had discussed on tapering her off the hormone replacement therapy but she stated that we have attempted this  before and her hot flashes or so unbearable that she would like to continue on it. She is fully aware of potential risk for breast cancer on patients on long-term hormone replacement therapy.

## 2013-03-19 DIAGNOSIS — Z79899 Other long term (current) drug therapy: Secondary | ICD-10-CM | POA: Diagnosis not present

## 2013-03-19 DIAGNOSIS — L439 Lichen planus, unspecified: Secondary | ICD-10-CM | POA: Diagnosis not present

## 2013-03-26 ENCOUNTER — Ambulatory Visit (INDEPENDENT_AMBULATORY_CARE_PROVIDER_SITE_OTHER): Payer: Medicare Other

## 2013-03-26 DIAGNOSIS — Z780 Asymptomatic menopausal state: Secondary | ICD-10-CM

## 2013-03-26 DIAGNOSIS — Z79890 Hormone replacement therapy (postmenopausal): Secondary | ICD-10-CM

## 2013-04-13 ENCOUNTER — Other Ambulatory Visit: Payer: Self-pay | Admitting: *Deleted

## 2013-04-13 MED ORDER — ATENOLOL 50 MG PO TABS: 50.0000 mg | ORAL_TABLET | Freq: Every day | ORAL | Status: DC

## 2013-04-19 ENCOUNTER — Emergency Department (HOSPITAL_COMMUNITY): Payer: Medicare Other

## 2013-04-19 ENCOUNTER — Encounter (HOSPITAL_COMMUNITY): Payer: Self-pay | Admitting: Emergency Medicine

## 2013-04-19 ENCOUNTER — Emergency Department (HOSPITAL_COMMUNITY)
Admission: EM | Admit: 2013-04-19 | Discharge: 2013-04-19 | Disposition: A | Discharge status: Home / Self Care | Payer: Medicare Other | Attending: Emergency Medicine | Admitting: Emergency Medicine

## 2013-04-19 DIAGNOSIS — G43909 Migraine, unspecified, not intractable, without status migrainosus: Secondary | ICD-10-CM | POA: Insufficient documentation

## 2013-04-19 DIAGNOSIS — Z7982 Long term (current) use of aspirin: Secondary | ICD-10-CM | POA: Diagnosis not present

## 2013-04-19 DIAGNOSIS — I10 Essential (primary) hypertension: Secondary | ICD-10-CM | POA: Diagnosis not present

## 2013-04-19 DIAGNOSIS — I480 Paroxysmal atrial fibrillation: Secondary | ICD-10-CM

## 2013-04-19 DIAGNOSIS — R0602 Shortness of breath: Secondary | ICD-10-CM | POA: Insufficient documentation

## 2013-04-19 DIAGNOSIS — IMO0002 Reserved for concepts with insufficient information to code with codable children: Secondary | ICD-10-CM | POA: Diagnosis not present

## 2013-04-19 DIAGNOSIS — R002 Palpitations: Secondary | ICD-10-CM | POA: Diagnosis not present

## 2013-04-19 DIAGNOSIS — Z79899 Other long term (current) drug therapy: Secondary | ICD-10-CM | POA: Insufficient documentation

## 2013-04-19 DIAGNOSIS — F3289 Other specified depressive episodes: Secondary | ICD-10-CM | POA: Insufficient documentation

## 2013-04-19 DIAGNOSIS — R5381 Other malaise: Secondary | ICD-10-CM | POA: Insufficient documentation

## 2013-04-19 DIAGNOSIS — I4891 Unspecified atrial fibrillation: Secondary | ICD-10-CM | POA: Diagnosis not present

## 2013-04-19 DIAGNOSIS — Z880 Allergy status to penicillin: Secondary | ICD-10-CM | POA: Diagnosis not present

## 2013-04-19 DIAGNOSIS — Z87448 Personal history of other diseases of urinary system: Secondary | ICD-10-CM | POA: Insufficient documentation

## 2013-04-19 DIAGNOSIS — F329 Major depressive disorder, single episode, unspecified: Secondary | ICD-10-CM | POA: Diagnosis not present

## 2013-04-19 DIAGNOSIS — E669 Obesity, unspecified: Secondary | ICD-10-CM | POA: Diagnosis not present

## 2013-04-19 DIAGNOSIS — R5383 Other fatigue: Secondary | ICD-10-CM | POA: Insufficient documentation

## 2013-04-19 DIAGNOSIS — M1710 Unilateral primary osteoarthritis, unspecified knee: Secondary | ICD-10-CM | POA: Insufficient documentation

## 2013-04-19 LAB — CBC WITH DIFFERENTIAL/PLATELET
Basophils Absolute: 0.1 10*3/uL (ref 0.0–0.1)
Basophils Relative: 1 % (ref 0–1)
Eosinophils Relative: 4 % (ref 0–5)
Lymphocytes Relative: 31 % (ref 12–46)
MCHC: 34.5 g/dL (ref 30.0–36.0)
MCV: 96.8 fL (ref 78.0–100.0)
Monocytes Absolute: 0.3 10*3/uL (ref 0.1–1.0)
Neutro Abs: 3.5 10*3/uL (ref 1.7–7.7)
Platelets: 170 10*3/uL (ref 150–400)
RDW: 12.8 % (ref 11.5–15.5)
WBC: 5.9 10*3/uL (ref 4.0–10.5)

## 2013-04-19 LAB — POCT I-STAT, CHEM 8
BUN: 15 mg/dL (ref 6–23)
Calcium, Ion: 1.17 mmol/L (ref 1.13–1.30)
HCT: 41 % (ref 36.0–46.0)
Hemoglobin: 13.9 g/dL (ref 12.0–15.0)
Sodium: 138 mEq/L (ref 135–145)
TCO2: 18 mmol/L (ref 0–100)

## 2013-04-19 LAB — POCT I-STAT TROPONIN I: Troponin i, poc: 0.01 ng/mL (ref 0.00–0.08)

## 2013-04-19 NOTE — ED Provider Notes (Addendum)
History    CSN: [id number] Arrival date & time 04/19/13  1191  First MD Initiated Contact with Patient 04/19/13 0700     Chief Complaint  Patient presents with  . Palpitations   (Consider location/radiation/quality/duration/timing/severity/associated sxs/prior Treatment) Patient is a 67 y.o. female presenting with palpitations. The history is provided by the patient.  Palpitations Palpitations quality:  Fast Onset quality:  Sudden Duration:  4 hours Timing:  Constant Progression:  Resolved Chronicity:  Recurrent Context: not appetite suppressants, not bronchodilators, not caffeine, not illicit drugs and not stimulant use   Context comment:  Woke up about 3am with racing heart and overall feeling poorly.  some pressure to the chest and irregular heart beat Relieved by:  Nothing Worsened by:  Nothing tried Ineffective treatments:  Beta blockers (took 25 of atenolol when it first happened without improvement but then took another 25mg  pta and now resolved sx) Associated symptoms: chest pressure, malaise/fatigue and shortness of breath   Associated symptoms: no back pain, no diaphoresis, no dizziness, no nausea, no near-syncope and no vomiting   Risk factors: hx of atrial fibrillation   Risk factors: no diabetes mellitus and no hyperthyroidism    Past Medical History  Diagnosis Date  . Allergy   . Atrial fibrillation   . Migraines     on Zoloft for migraines  . Hypertension   . Chronic interstitial nephritis   . Depression   . Palpitations   . Obesity   . Arthritis     ON BOTH KNEES   Past Surgical History  Procedure Laterality Date  . Cesarean section     Family History  Problem Relation Age of Onset  . Cancer Brother     PANCREATIC   History  Substance Use Topics  . Smoking status: Never Smoker   . Smokeless tobacco: Never Used  . Alcohol Use: 8.4 oz/week    14 Glasses of wine per week     Comment: WINE- TWO GLASSES AT [employer]   OB History   Grav Para Term  Preterm Abortions TAB SAB Ect Mult Living   2 2 2       2      Review of Systems  Constitutional: Positive for malaise/fatigue. Negative for diaphoresis.  Respiratory: Positive for shortness of breath.   Cardiovascular: Positive for palpitations. Negative for near-syncope.  Gastrointestinal: Negative for nausea and vomiting.  Musculoskeletal: Negative for back pain.  Neurological: Negative for dizziness.  All other systems reviewed and are negative.    Allergies  Aspirin; Penicillins; and Epinephrine  Home Medications   Current Outpatient Rx  Name  Route  Sig  Dispense  Refill  . aspirin 325 MG tablet   Oral   Take 325 mg by mouth daily.           [name] atenolol (TENORMIN) 50 MG tablet   Oral   Take 1 tablet (50 mg total) by mouth daily.   30 tablet   3   . Cholecalciferol (VITAMIN D-3 PO)   Oral   Take 2,000 mg by mouth daily.           [name] diltiazem (DILACOR XR) 180 MG 24 hr capsule      TAKE ONE CAPSULE BY MOUTH DAILY   90 capsule   3   . estrogens, conjugated, (PREMARIN) 0.3 MG tablet   Oral   Take 1 tablet (0.3 mg total) by mouth daily.   30 tablet   11   . hydroxychloroquine (PLAQUENIL) 200 MG  tablet   Oral   Take 200 mg by mouth 2 (two) times daily.         . medroxyPROGESTERone (PROVERA) 10 MG tablet      Take one tablet daily for 10 days of the month   10 tablet   11   . METROGEL 1 % gel   Topical   Apply 1 application topically as needed.          . predniSONE (STERAPRED UNI-PAK) 10 MG tablet   Oral   Take 1 tablet (10 mg total) by mouth daily. 10 Mg Steroid Pak for 6 days #21   21 tablet   0   . sertraline (ZOLOFT) 50 MG tablet      TAKE 1 TABLET BY MOUTH DAILY   30 tablet   4    There were no vitals taken for this visit. Physical Exam  Nursing note and vitals reviewed. Constitutional: She is oriented to person, place, and time. She appears well-developed and well-nourished. No distress.  HENT:  Head: Normocephalic and  atraumatic.  Mouth/Throat: Oropharynx is clear and moist.  Eyes: Conjunctivae and EOM are normal. Pupils are equal, round, and reactive to light.  Neck: Normal range of motion. Neck supple.  Cardiovascular: Normal rate, regular rhythm and intact distal pulses.   No murmur heard. Pulmonary/Chest: Effort normal and breath sounds normal. No respiratory distress. She has no wheezes. She has no rales.  Abdominal: Soft. She exhibits no distension. There is no tenderness. There is no rebound and no guarding.  Musculoskeletal: Normal range of motion. She exhibits no edema and no tenderness.  Neurological: She is alert and oriented to person, place, and time.  Skin: Skin is warm and dry. No rash noted. No erythema.  Psychiatric: She has a normal mood and affect. Her behavior is normal.    ED Course  Procedures (including critical care time) Labs Reviewed  POCT I-STAT, CHEM 8 - Abnormal; Notable for the following:    Glucose, Bld 129 (*)    All other components within normal limits  CBC WITH DIFFERENTIAL  POCT I-STAT TROPONIN I   Dg Chest 2 View  04/19/2013   *RADIOLOGY REPORT*  Clinical Data: Palpitations  CHEST - 2 VIEW  Comparison: Prior chest x-ray 09/21/2008  Findings: The lungs are well-aerated and free from pulmonary edema, focal airspace consolidation or pulmonary nodule.  Stable chronic bronchitic changes.  Cardiac and mediastinal contours are within normal limits.  No pneumothorax, or pleural effusion. No acute osseous findings. The visualized colon is gas filled but nondistended.  IMPRESSION:  No acute cardiopulmonary disease.   Original Report Authenticated By: Malachy Moan, M.D.     Date: 04/19/2013  Rate: 84  Rhythm: normal sinus rhythm  QRS Axis: normal  Intervals: normal  ST/T Wave abnormalities: normal  Conduction Disutrbances: none  Narrative Interpretation: unremarkable     1. Paroxysmal atrial fibrillation     MDM   Patient with a history of paroxysmal atrial  fibrillation and is on atenolol and diltiazem presents with palpitations that started at 3 and in general things appearing well and she took and a second atenolol improvement and intact second half prior to arrival.  Upon arrival patient started feeling better and now feels back to normal.  EKG shows a normal sinus rhythm abnormality and patient is now well-appearing and asymptomatic.  He'll patient was in atrial fibrillation with RVR early area which has now spontaneously converted with medication she took at home.  Chest x-ray, CBC chem i-STAT and troponin pending  8:20 AM All labs and plain films within normal limits. Patient remains in a sinus rhythm and was discharged home to follow up with cardiology for any problems. She was instructed to take her diltiazem is normal and continue her atenolol as well.   Gwyneth Sprout, MD 04/19/13 [id number]  Gwyneth Sprout, MD 04/19/13 [id number]

## 2013-04-19 NOTE — ED Notes (Addendum)
PT. REPORTS INTERMITTENT PALPITATIONS WITH SLIGHT DIZZINESS ONSET LAST NIGHT , DENIES CHEST PAIN OR SOB . PT. HAS A HISTORY OF ATRIAL FIBRILLATION HER CARDIOLOGIST IS DR. WALL.

## 2013-04-19 NOTE — ED Notes (Signed)
Patient transported to X-ray 

## 2013-04-23 ENCOUNTER — Ambulatory Visit (INDEPENDENT_AMBULATORY_CARE_PROVIDER_SITE_OTHER): Payer: Medicare Other | Admitting: Internal Medicine

## 2013-04-23 ENCOUNTER — Encounter: Payer: Self-pay | Admitting: Internal Medicine

## 2013-04-23 VITALS — BP 120/78 | HR 72 | Temp 97.6°F | Resp 16 | Wt 220.0 lb

## 2013-04-23 DIAGNOSIS — H60399 Other infective otitis externa, unspecified ear: Secondary | ICD-10-CM | POA: Diagnosis not present

## 2013-04-23 DIAGNOSIS — I4891 Unspecified atrial fibrillation: Secondary | ICD-10-CM

## 2013-04-23 DIAGNOSIS — L259 Unspecified contact dermatitis, unspecified cause: Secondary | ICD-10-CM

## 2013-04-23 DIAGNOSIS — R002 Palpitations: Secondary | ICD-10-CM

## 2013-04-23 DIAGNOSIS — H60542 Acute eczematoid otitis externa, left ear: Secondary | ICD-10-CM

## 2013-04-23 DIAGNOSIS — H60392 Other infective otitis externa, left ear: Secondary | ICD-10-CM

## 2013-04-23 DIAGNOSIS — Z0000 Encounter for general adult medical examination without abnormal findings: Secondary | ICD-10-CM

## 2013-04-23 MED ORDER — DOXYCYCLINE HYCLATE 100 MG PO TABS: 100.0000 mg | ORAL_TABLET | Freq: Two times a day (BID) | ORAL | Status: DC

## 2013-04-23 MED ORDER — CIPROFLOXACIN-HYDROCORTISONE 0.2-1 % OT SUSP: 3.0000 [drp] | Freq: Two times a day (BID) | OTIC | Status: DC

## 2013-04-23 NOTE — Progress Notes (Signed)
  Subjective:    Patient ID: Amanda Wilkins, female    DOB: Mar 10, 1946, 67 y.o.   MRN: [id number]  Otalgia  There is pain in the left ear. This is a new problem. The current episode started in the past 7 days. The problem occurs constantly. The problem has been gradually worsening. The pain is moderate. She has tried acetaminophen for the symptoms. The treatment provided no relief. There is no history of a chronic ear infection.   F/u palpitations - went to ER  Review of Systems  HENT: Positive for ear pain.   Respiratory: Negative for shortness of breath and wheezing.   Cardiovascular: Positive for palpitations. Negative for leg swelling.  Gastrointestinal: Negative for nausea and abdominal distention.  Neurological: Negative for weakness.  Psychiatric/Behavioral: Negative for decreased concentration.       Objective:   Physical Exam  Constitutional: She appears well-developed.  HENT:  Head: Normocephalic.  Right Ear: External ear normal.  Left Ear: External ear normal.  Nose: Nose normal.  Mouth/Throat: Oropharynx is clear and moist.  L ear canal is erythematous and swollen B TMs are ok  Eyes: Conjunctivae are normal. Pupils are equal, round, and reactive to light. Right eye exhibits no discharge. Left eye exhibits no discharge.  Neck: Normal range of motion. Neck supple. No JVD present. No tracheal deviation present. No thyromegaly present.  Cardiovascular: Normal rate, regular rhythm and normal heart sounds.   Pulmonary/Chest: No stridor. No respiratory distress. She has no wheezes.  Abdominal: Soft. Bowel sounds are normal. She exhibits no distension and no mass. There is no tenderness. There is no rebound and no guarding.  Musculoskeletal: She exhibits no edema and no tenderness.  Lymphadenopathy:    She has no cervical adenopathy.  Neurological: She displays normal reflexes. No cranial nerve deficit. She exhibits normal muscle tone. Coordination normal.  Skin: No rash  noted. No erythema.  Psychiatric: She has a normal mood and affect. Her behavior is normal. Judgment and thought content normal.          Assessment & Plan:

## 2013-04-23 NOTE — Assessment & Plan Note (Signed)
Doxy Cipro-HC Lotrisone cream

## 2013-04-26 ENCOUNTER — Encounter: Payer: Self-pay | Admitting: Internal Medicine

## 2013-04-26 NOTE — Addendum Note (Signed)
Addended by: Tresa Garter on: 04/26/2013 01:12 PM   Modules accepted: Orders

## 2013-04-26 NOTE — Assessment & Plan Note (Signed)
On ASA 

## 2013-04-26 NOTE — Assessment & Plan Note (Signed)
Continue with current prescription therapy as reflected on the Med list.  

## 2013-04-26 NOTE — Assessment & Plan Note (Signed)
Lotrisone Ear drops if needed

## 2013-04-30 DIAGNOSIS — L821 Other seborrheic keratosis: Secondary | ICD-10-CM | POA: Diagnosis not present

## 2013-04-30 DIAGNOSIS — L659 Nonscarring hair loss, unspecified: Secondary | ICD-10-CM | POA: Diagnosis not present

## 2013-04-30 DIAGNOSIS — L658 Other specified nonscarring hair loss: Secondary | ICD-10-CM | POA: Diagnosis not present

## 2013-04-30 DIAGNOSIS — L74519 Primary focal hyperhidrosis, unspecified: Secondary | ICD-10-CM | POA: Diagnosis not present

## 2013-06-08 ENCOUNTER — Ambulatory Visit (INDEPENDENT_AMBULATORY_CARE_PROVIDER_SITE_OTHER): Payer: Medicare Other | Admitting: Internal Medicine

## 2013-06-08 ENCOUNTER — Encounter: Payer: Self-pay | Admitting: Internal Medicine

## 2013-06-08 VITALS — BP 140/86 | HR 74 | Temp 98.3°F | Wt 222.0 lb

## 2013-06-08 DIAGNOSIS — I4891 Unspecified atrial fibrillation: Secondary | ICD-10-CM | POA: Diagnosis not present

## 2013-06-08 DIAGNOSIS — R635 Abnormal weight gain: Secondary | ICD-10-CM | POA: Diagnosis not present

## 2013-06-08 DIAGNOSIS — L659 Nonscarring hair loss, unspecified: Secondary | ICD-10-CM | POA: Diagnosis not present

## 2013-06-08 DIAGNOSIS — J309 Allergic rhinitis, unspecified: Secondary | ICD-10-CM

## 2013-06-08 DIAGNOSIS — R002 Palpitations: Secondary | ICD-10-CM

## 2013-06-08 MED ORDER — SERTRALINE HCL 50 MG PO TABS: 50.0000 mg | ORAL_TABLET | Freq: Every day | ORAL | Status: DC

## 2013-06-08 MED ORDER — ATENOLOL 50 MG PO TABS: 50.0000 mg | ORAL_TABLET | Freq: Every day | ORAL | Status: DC

## 2013-06-08 MED ORDER — FINASTERIDE 5 MG PO TABS: ORAL_TABLET | ORAL | Status: DC

## 2013-06-08 MED ORDER — DILTIAZEM HCL ER 180 MG PO CP24: 180.0000 mg | ORAL_CAPSULE | Freq: Every day | ORAL | Status: DC

## 2013-06-08 NOTE — Progress Notes (Signed)
  Subjective:    HPI  The patient is here for a wellness exam.   The patient has been doing well overall without major physical or psychological issues going on lately.  The patient needs to address  chronic palpitations, alopecia  BP Readings from Last 3 Encounters:  06/08/13 140/86  04/23/13 120/78  01/06/13 124/78   Wt Readings from Last 3 Encounters:  06/08/13 222 lb (100.699 kg)  04/23/13 220 lb (99.791 kg)  01/06/13 221 lb (100.245 kg)       Review of Systems  Constitutional: Negative for chills, activity change, appetite change, fatigue and unexpected weight change.  HENT: Negative for congestion, mouth sores and sinus pressure.   Eyes: Negative for visual disturbance.  Respiratory: Negative for cough and chest tightness.   Cardiovascular: Positive for palpitations. Negative for leg swelling.  Gastrointestinal: Negative for nausea and abdominal pain.  Genitourinary: Negative for dysuria, urgency, frequency, hematuria, difficulty urinating and vaginal pain.  Musculoskeletal: Negative for myalgias, back pain and gait problem.  Skin: Negative for pallor and rash.       Hair loss  Allergic/Immunologic: Positive for environmental allergies.  Neurological: Negative for dizziness, tremors, weakness, numbness and headaches.  Psychiatric/Behavioral: Negative for suicidal ideas, confusion and sleep disturbance.       Objective:   Physical Exam  Constitutional: She appears well-developed. No distress.  Obese   HENT:  Head: Normocephalic.  Right Ear: External ear normal.  Left Ear: External ear normal.  Nose: Nose normal.  Mouth/Throat: Oropharynx is clear and moist.  Eyes: Conjunctivae are normal. Pupils are equal, round, and reactive to light. Right eye exhibits no discharge. Left eye exhibits no discharge.  Neck: Normal range of motion. Neck supple. No JVD present. No tracheal deviation present. No thyromegaly present.  Cardiovascular: Normal rate, regular rhythm  and normal heart sounds.   No murmur heard. Pulmonary/Chest: No stridor. No respiratory distress. She has no wheezes. She has no rales.  Abdominal: Soft. Bowel sounds are normal. She exhibits no distension and no mass. There is no tenderness. There is no rebound and no guarding.  Musculoskeletal: She exhibits no edema and no tenderness.  Lymphadenopathy:    She has no cervical adenopathy.  Neurological: She displays normal reflexes. No cranial nerve deficit. She exhibits normal muscle tone. Coordination normal.  Skin: No rash noted. No erythema.  alopecia  Psychiatric: She has a normal mood and affect. Her behavior is normal. Judgment and thought content normal.   Lab Results  Component Value Date   WBC 5.9 04/19/2013   HGB 13.9 04/19/2013   HCT 41.0 04/19/2013   PLT 170 04/19/2013   GLUCOSE 129* 04/19/2013   CHOL 173 04/16/2009   TRIG 72 04/16/2009   HDL 85 04/16/2009   LDLCALC 74 04/16/2009   ALT 26 06/11/2011   AST 22 06/11/2011   NA 138 04/19/2013   K 4.0 04/19/2013   CL 109 04/19/2013   CREATININE 0.80 04/19/2013   BUN 15 04/19/2013   CO2 20 11/08/2009   TSH 2.998 12/26/2011   INR 0.9 09/21/2008   HGBA1C 5.5 02/20/2008           Assessment & Plan:

## 2013-06-08 NOTE — Assessment & Plan Note (Signed)
Continue with current prn therapy as reflected on the Med list.  

## 2013-06-08 NOTE — Patient Instructions (Addendum)
[email or web address]   Milk free trial (no milk, ice cream, cheese and yogurt) for 4-6 weeks. OK to use almond, coconut, rice or soy milk.   [email or web address]

## 2013-06-08 NOTE — Assessment & Plan Note (Signed)
Labs On multiple neds

## 2013-06-08 NOTE — Assessment & Plan Note (Signed)
Continue with current prescription therapy as reflected on the Med list.  

## 2013-06-08 NOTE — Assessment & Plan Note (Signed)
No relapse lately Continue with current prescription therapy as reflected on the Med list.

## 2013-06-11 ENCOUNTER — Other Ambulatory Visit: Payer: Self-pay | Admitting: Internal Medicine

## 2013-06-11 ENCOUNTER — Ambulatory Visit (INDEPENDENT_AMBULATORY_CARE_PROVIDER_SITE_OTHER): Payer: Medicare Other | Admitting: Internal Medicine

## 2013-06-11 ENCOUNTER — Encounter: Payer: Self-pay | Admitting: Internal Medicine

## 2013-06-11 VITALS — BP 122/78 | HR 73 | Ht 66.0 in | Wt 221.6 lb

## 2013-06-11 DIAGNOSIS — I4891 Unspecified atrial fibrillation: Secondary | ICD-10-CM | POA: Diagnosis not present

## 2013-06-11 DIAGNOSIS — Z0000 Encounter for general adult medical examination without abnormal findings: Secondary | ICD-10-CM | POA: Diagnosis not present

## 2013-06-11 DIAGNOSIS — E782 Mixed hyperlipidemia: Secondary | ICD-10-CM | POA: Diagnosis not present

## 2013-06-11 LAB — HEPATIC FUNCTION PANEL
ALT: 35 U/L (ref 0–35)
AST: 31 U/L (ref 0–37)
Albumin: 4.3 g/dL (ref 3.5–5.2)
Alkaline Phosphatase: 64 U/L (ref 39–117)
Total Bilirubin: 0.7 mg/dL (ref 0.3–1.2)

## 2013-06-11 LAB — LIPID PANEL
Cholesterol: 188 mg/dL (ref 0–200)
HDL: 81 mg/dL (ref >39)
Total CHOL/HDL Ratio: 2.3 Ratio
Triglycerides: 71 mg/dL (ref <150)

## 2013-06-11 LAB — CBC
Hemoglobin: 13.3 g/dL (ref 12.0–15.0)
MCH: 33.1 pg (ref 26.0–34.0)
MCHC: 33.3 g/dL (ref 30.0–36.0)
Platelets: 192 10*3/uL (ref 150–400)
RDW: 13.5 % (ref 11.5–15.5)

## 2013-06-11 LAB — BASIC METABOLIC PANEL
Calcium: 9.2 mg/dL (ref 8.4–10.5)
Creat: 0.86 mg/dL (ref 0.50–1.10)
Sodium: 133 mEq/L — ABNORMAL LOW (ref 135–145)

## 2013-06-11 LAB — TSH: TSH: 2.132 u[IU]/mL (ref 0.350–4.500)

## 2013-06-11 NOTE — Patient Instructions (Addendum)
Your physician wants you to follow-up in: 8 months  You will receive a reminder letter in the mail two months in advance. If you don't receive a letter, please call our office to schedule the follow-up appointment.   We will call you with information on a SNAP home sleep study. Order paper filled out/ will take to [facility] to fax.

## 2013-06-11 NOTE — Progress Notes (Signed)
HPI Patient is a 67 yo who was previously followed by T Wall He saw her last in Aug 2013  History of PAF Seen by Darrel Hoover in Feb.  Compliained of palpitaotins prior.   In July she woke up with heart beating irregularity. .  Had been under increased stress  Went to [place].  In EKG in SR. Was drinking more wine at that time.  Still does.  NO CP  Breathin OK otherwise. Allergies  Allergen Reactions  . Aspirin     Can take the coated 325 mg. Plain asa 325 mg speeds up the heart.  . Penicillins     She took amoxicillin in 2012-2013 and was ok  . Epinephrine Palpitations    Current Outpatient Prescriptions  Medication Sig Dispense Refill  . aspirin EC 325 MG tablet Take 325 mg by mouth daily.      [name] atenolol (TENORMIN) 50 MG tablet Take 1 tablet (50 mg total) by mouth daily.  90 tablet  3  . cholecalciferol (VITAMIN D) 1000 UNITS tablet Take 1,000 Units by mouth daily.      [name] diltiazem (DILACOR XR) 180 MG 24 hr capsule Take 1 capsule (180 mg total) by mouth daily.  90 capsule  3  . estrogens, conjugated, (PREMARIN) 0.3 MG tablet Take 1 tablet (0.3 mg total) by mouth daily.  30 tablet  11  . finasteride (PROPECIA) 1 MG tablet Take 1 mg by mouth daily.      . finasteride (PROSCAR) 5 MG tablet As directed daily  30 tablet  11  . hydroxychloroquine (PLAQUENIL) 200 MG tablet Take 200 mg by mouth 2 (two) times daily.      . medroxyPROGESTERone (PROVERA) 10 MG tablet Take one tablet daily for 10 days of the month  10 tablet  11  . METROGEL 1 % gel Apply 1 application topically daily as needed (for rosacea).       . sertraline (ZOLOFT) 50 MG tablet Take 1 tablet (50 mg total) by mouth daily.  90 tablet  3   No current facility-administered medications for this visit.    Past Medical History  Diagnosis Date  . Allergy   . Atrial fibrillation   . Migraines     on Zoloft for migraines  . Hypertension   . Chronic interstitial nephritis   . Depression   . Palpitations   . Obesity   .  Arthritis     ON BOTH KNEES    Past Surgical History  Procedure Laterality Date  . Cesarean section      Family History  Problem Relation Age of Onset  . Cancer Brother     PANCREATIC    History   Social History  . Marital Status: Married    Spouse Name: N/A    Number of Children: N/A  . Years of Education: N/A   Occupational History  . Not on file.   Social History Main Topics  . Smoking status: Never Smoker   . Smokeless tobacco: Never Used  . Alcohol Use: 8.4 oz/week    14 Glasses of wine per week     Comment: WINE- TWO GLASSES AT [employer]  . Drug Use: No  . Sexual Activity: Yes   Other Topics Concern  . Not on file   Social History Narrative  . No narrative on file    Review of Systems:  All systems reviewed.  They are negative to the above problem except as previously stated.  Vital Signs: BP  122/78  Pulse 73  Ht 5\' 6"  (1.676 m)  Wt 221 lb 9.6 oz (100.517 kg)  BMI 35.78 kg/m2  Physical Exam Patient is in NAD HEENT:  Normocephalic, atraumatic. EOMI, PERRLA.  Neck: JVP is normal.  No bruits.  Lungs: clear to auscultation. No rales no wheezes.  Heart: Regular rate and rhythm. Normal S1, S2. No S3.   No significant murmurs. PMI not displaced.  Abdomen:  Supple, nontender. Normal bowel sounds. No masses. No hepatomegaly.  Extremities:   Good distal pulses throughout. No lower extremity edema.  Musculoskeletal :moving all extremities.  Neuro:   alert and oriented x3.  CN II-XII grossly intact.  EKG  SR 73 bpm Assessment and Plan:  1.  PAF  Continue current regimen  Cut back on ETOH  In talking to she has been told she has jerky motion/startled while sleeping  QUestion sleep apnea.  Would set up for sleep study   2.  Obesity  Counselled on wt loss/exercise  3.  Derm  Patient seen for hyperhidrosis.  Given Rx for 1 mg bid Robinul  Will review with pharmacy  F/U in 6 months.

## 2013-06-12 LAB — URINALYSIS, ROUTINE W REFLEX MICROSCOPIC

## 2013-06-12 LAB — SEDIMENTATION RATE: Sed Rate: 5 mm/hr (ref 0–22)

## 2013-06-29 DIAGNOSIS — L439 Lichen planus, unspecified: Secondary | ICD-10-CM | POA: Diagnosis not present

## 2013-07-02 ENCOUNTER — Telehealth: Payer: Self-pay | Admitting: Internal Medicine

## 2013-07-02 NOTE — Telephone Encounter (Signed)
Rec'd from [facility] forward 3 pages to Dr.Plotnikov

## 2013-07-03 ENCOUNTER — Encounter: Payer: Self-pay | Admitting: Internal Medicine

## 2013-07-03 ENCOUNTER — Ambulatory Visit (INDEPENDENT_AMBULATORY_CARE_PROVIDER_SITE_OTHER): Payer: Medicare Other | Admitting: Internal Medicine

## 2013-07-03 VITALS — BP 128/78 | HR 78 | Temp 98.0°F | Wt 219.0 lb

## 2013-07-03 DIAGNOSIS — H6693 Otitis media, unspecified, bilateral: Secondary | ICD-10-CM

## 2013-07-03 DIAGNOSIS — H6690 Otitis media, unspecified, unspecified ear: Secondary | ICD-10-CM | POA: Diagnosis not present

## 2013-07-03 MED ORDER — CEFUROXIME AXETIL 500 MG PO TABS: 500.0000 mg | ORAL_TABLET | Freq: Two times a day (BID) | ORAL | Status: DC

## 2013-07-03 NOTE — Progress Notes (Signed)
Subjective:    Patient ID: Amanda Wilkins, female    DOB: [birth year], 67 y.o.   MRN: [id number]  HPI  Pt presents to the clinic today with c/o dizziness. This started 1 day ago. She reports it is worse when she lays down and she seems unsteady when she walks. She has not taken anything OTC. She cannot remember the last time she was dizzy. She does have a history of allergies and does take claritin for this. She reports this is different than allergies. She denies fever, chills or body aches. She has had sick contacts.  Review of Systems      Past Medical History  Diagnosis Date  . Allergy   . Atrial fibrillation   . Migraines     on Zoloft for migraines  . Hypertension   . Chronic interstitial nephritis   . Depression   . Palpitations   . Obesity   . Arthritis     ON BOTH KNEES    Current Outpatient Prescriptions  Medication Sig Dispense Refill  . aspirin EC 325 MG tablet Take 325 mg by mouth daily.      [name] atenolol (TENORMIN) 50 MG tablet Take 1 tablet (50 mg total) by mouth daily.  90 tablet  3  . cholecalciferol (VITAMIN D) 1000 UNITS tablet Take 1,000 Units by mouth daily.      [name] diltiazem (DILACOR XR) 180 MG 24 hr capsule Take 1 capsule (180 mg total) by mouth daily.  90 capsule  3  . estrogens, conjugated, (PREMARIN) 0.3 MG tablet Take 1 tablet (0.3 mg total) by mouth daily.  30 tablet  11  . finasteride (PROPECIA) 1 MG tablet Take 1 mg by mouth daily.      . finasteride (PROSCAR) 5 MG tablet As directed daily  30 tablet  11  . hydroxychloroquine (PLAQUENIL) 200 MG tablet Take 200 mg by mouth 2 (two) times daily.      . medroxyPROGESTERone (PROVERA) 10 MG tablet Take one tablet daily for 10 days of the month  10 tablet  11  . METROGEL 1 % gel Apply 1 application topically daily as needed (for rosacea).       . sertraline (ZOLOFT) 50 MG tablet Take 1 tablet (50 mg total) by mouth daily.  90 tablet  3   No current facility-administered medications for this visit.     Allergies  Allergen Reactions  . Aspirin     Can take the coated 325 mg. Plain asa 325 mg speeds up the heart.  . Penicillins     She took amoxicillin in 2012-2013 and was ok  . Epinephrine Palpitations    Family History  Problem Relation Age of Onset  . Cancer Brother     PANCREATIC    History   Social History  . Marital Status: Married    Spouse Name: N/A    Number of Children: N/A  . Years of Education: N/A   Occupational History  . Not on file.   Social History Main Topics  . Smoking status: Never Smoker   . Smokeless tobacco: Never Used  . Alcohol Use: 8.4 oz/week    14 Glasses of wine per week     Comment: WINE- TWO GLASSES AT D[employer]NER  . Drug Use: Yes  . Sexual Activity: Yes   Other Topics Concern  . Not on file   Social History Narrative  . No narrative on file     Constitutional: Denies fever, malaise, fatigue, headache or abrupt  weight changes.  HEENT: Pt reports ear pain. Denies eye pain, eye redness, ringing in the ears, wax buildup, runny nose, nasal congestion, bloody nose, or sore throat. Respiratory: Denies difficulty breathing, shortness of breath, cough or sputum production.   Cardiovascular: Denies chest pain, chest tightness, palpitations or swelling in the hands or feet.  Neurological: Pt reports dizziness. Denies difficulty with memory, difficulty with speech or problems with balance and coordination.   No other specific complaints in a complete review of systems (except as listed in HPI above).  Objective:   Physical Exam  BP 128/78  Pulse 78  Temp(Src) 98 F (36.7 C) (Oral)  Wt 219 lb (99.338 kg)  BMI 35.36 kg/m2  SpO2 98% Wt Readings from Last 3 Encounters:  07/03/13 219 lb (99.338 kg)  06/11/13 221 lb 9.6 oz (100.517 kg)  06/08/13 222 lb (100.699 kg)    General: Appears her stated age, well developed, well nourished in NAD. HEENT: Head: normal shape and size; Eyes: sclera white, no icterus, conjunctiva pink, PERRLA and  EOMs intact; Ears: Tm's red and intact, distorted light reflex + effusion bilaterally; Nose: mucosa pink and moist, septum midline; Throat/Mouth: Teeth present, mucosa pink and moist, no exudate, lesions or ulcerations noted.  Neck: Normal range of motion. Neck supple, trachea midline. No massses, lumps or thyromegaly present.  Cardiovascular: Normal rate and rhythm. S1,S2 noted.  No murmur, rubs or gallops noted. No JVD or BLE edema. No carotid bruits noted. Pulmonary/Chest: Normal effort and positive vesicular breath sounds. No respiratory distress. No wheezes, rales or ronchi noted.  Neurological: Alert and oriented. Cranial nerves II-XII intact. Coordination normal. +DTRs bilaterally.   BMET    Component Value Date/Time   NA 133* 06/11/2013 0923   K 5.1 06/11/2013 0923   CL 99 06/11/2013 0923   CO2 22 06/11/2013 0923   GLUCOSE 97 06/11/2013 0923   BUN 16 06/11/2013 0923   CREATININE 0.86 06/11/2013 0923   CREATININE 0.80 04/19/2013 0753   CALCIUM 9.2 06/11/2013 0923    Lipid Panel     Component Value Date/Time   CHOL 188 06/11/2013 0923   TRIG 71 06/11/2013 0923   HDL 81 06/11/2013 0923   CHOLHDL 2.3 06/11/2013 0923   VLDL 14 06/11/2013 0923   LDLCALC 93 06/11/2013 0923    CBC    Component Value Date/Time   WBC 4.8 06/11/2013 0923   RBC 4.02 06/11/2013 0923   HGB 13.3 06/11/2013 0923   HCT 39.9 06/11/2013 0923   PLT 192 06/11/2013 0923   MCV 99.3 06/11/2013 0923   MCH 33.1 06/11/2013 0923   MCHC 33.3 06/11/2013 0923   RDW 13.5 06/11/2013 0923   LYMPHSABS 1.8 04/19/2013 0716   MONOABS 0.3 04/19/2013 0716   EOSABS 0.2 04/19/2013 0716   BASOSABS 0.1 04/19/2013 0716    Hgb A1C Lab Results  Component Value Date   HGBA1C 5.5 02/20/2008         Assessment & Plan:   Bilateral Otitis Media, new onset:  eRx for Ceftin BID x 7 days Make position changes slowly If you develop fever, Ibuprofen as needed  RTC as needed or if dizziness persist throughout the weekend

## 2013-07-03 NOTE — Progress Notes (Signed)
HPI: Pt presents to office with complaints of dizziness. Symptoms started last night when patient laid down with facial pressure. Pt stated the room was spinning and increased when patient turned over. Reports symptoms decreased when patient got up and walked around. Pt states she did have some nausea and having bilateral ear pressure. Pt states she has had vertigo in the past, but this feels different since her ears feel "full." Pt denies headaches, runny nose, congestion, loss of balance or memory changes. Pt states she recently was around her grandchildren who both had colds.   Past Medical History  Diagnosis Date  . Allergy   . Atrial fibrillation   . Migraines     on Zoloft for migraines  . Hypertension   . Chronic interstitial nephritis   . Depression   . Palpitations   . Obesity   . Arthritis     ON BOTH KNEES    Current Outpatient Prescriptions  Medication Sig Dispense Refill  . aspirin EC 325 MG tablet Take 325 mg by mouth daily.      [name] atenolol (TENORMIN) 50 MG tablet Take 1 tablet (50 mg total) by mouth daily.  90 tablet  3  . cholecalciferol (VITAMIN D) 1000 UNITS tablet Take 1,000 Units by mouth daily.      [name] diltiazem (DILACOR XR) 180 MG 24 hr capsule Take 1 capsule (180 mg total) by mouth daily.  90 capsule  3  . estrogens, conjugated, (PREMARIN) 0.3 MG tablet Take 1 tablet (0.3 mg total) by mouth daily.  30 tablet  11  . finasteride (PROPECIA) 1 MG tablet Take 1 mg by mouth daily.      . finasteride (PROSCAR) 5 MG tablet As directed daily  30 tablet  11  . glycopyrrolate (ROBINUL) 1 MG tablet Take 1 mg by mouth 2 (two) times daily.      . hydroxychloroquine (PLAQUENIL) 200 MG tablet Take 200 mg by mouth 2 (two) times daily.      . medroxyPROGESTERone (PROVERA) 10 MG tablet Take one tablet daily for 10 days of the month  10 tablet  11  . METROGEL 1 % gel Apply 1 application topically daily as needed (for rosacea).       . sertraline (ZOLOFT) 50 MG tablet Take 1 tablet (50  mg total) by mouth daily.  90 tablet  3  . tacrolimus (PROTOPIC) 0.1 % ointment Apply topically 2 (two) times daily.       No current facility-administered medications for this visit.    Allergies  Allergen Reactions  . Aspirin     Can take the coated 325 mg. Plain asa 325 mg speeds up the heart.  . Penicillins     She took amoxicillin in 2012-2013 and was ok  . Epinephrine Palpitations    ROS:  Constitutional: Denies fever, malaise, fatigue, headache or abrupt weight changes.  HEENT: Endorses bilateral ear fullness/pressure.  Denies eye pain, eye redness, ear pain, ringing in the ears, wax buildup, runny nose, nasal congestion, bloody nose, or sore throat. Respiratory: Denies difficulty breathing, shortness of breath, cough or sputum production.   Cardiovascular: Denies chest pain, chest tightness, palpitations or swelling in the hands or feet.  Neurological: Endorses one episode of dizziness last night. Denies difficulty with memory, difficulty with speech or problems with balance and coordination.   No other specific complaints in a complete review of systems (except as listed in HPI above).  PE:  BP 128/78  Pulse 78  Temp(Src) 98  F (36.7 C) (Oral)  Wt 219 lb (99.338 kg)  BMI 35.36 kg/m2  SpO2 98% Wt Readings from Last 3 Encounters:  07/03/13 219 lb (99.338 kg)  06/11/13 221 lb 9.6 oz (100.517 kg)  06/08/13 222 lb (100.699 kg)    General: Appears their stated age, well developed, well nourished in NAD. HEENT: Head: normal shape and size; Eyes: sclera white, no icterus, conjunctiva pink, PERRLA and EOMs intact; Ears: Bilateral ear canals red, TMs serous fluid filled. Decreased light reflex bilaterally. Nose: mucosa pink and moist, septum midline; Throat/Mouth: Teeth present, mucosa pink and moist, no lesions or ulcerations noted.  Neck: Normal range of motion. Neck supple, trachea midline. No massses, lumps or thyromegaly present.  Cardiovascular: Normal rate and rhythm.  S1,S2 noted.  No murmur, rubs or gallops noted. No JVD or BLE edema. No carotid bruits noted. Pulmonary/Chest: Normal effort and positive vesicular breath sounds. No respiratory distress. No wheezes, rales or ronchi noted.     Assessment and Plan: Acute Otitis Media Bilaterally  Prescribed Ceftin 500mg  PO 1 tablet twice daily for 7 days (14) no refills If continued dizziness occurs please call and let us know Follow up Monday if symptoms do not improve  Pasqualina Colasurdo, Jacques Earthly, Student-NP

## 2013-07-03 NOTE — Patient Instructions (Signed)

## 2013-07-08 ENCOUNTER — Telehealth: Payer: Self-pay | Admitting: *Deleted

## 2013-07-08 NOTE — Telephone Encounter (Signed)
Faxed all 06/11/13 labs to Dr. Danella Deis per Gruber's request. Phone is [phone].

## 2013-07-14 ENCOUNTER — Ambulatory Visit (INDEPENDENT_AMBULATORY_CARE_PROVIDER_SITE_OTHER): Payer: Medicare Other

## 2013-07-14 DIAGNOSIS — Z23 Encounter for immunization: Secondary | ICD-10-CM | POA: Diagnosis not present

## 2013-07-22 DIAGNOSIS — Z79899 Other long term (current) drug therapy: Secondary | ICD-10-CM | POA: Diagnosis not present

## 2013-07-22 DIAGNOSIS — L439 Lichen planus, unspecified: Secondary | ICD-10-CM | POA: Diagnosis not present

## 2013-07-23 ENCOUNTER — Encounter: Payer: Self-pay | Admitting: Internal Medicine

## 2013-07-23 ENCOUNTER — Ambulatory Visit (INDEPENDENT_AMBULATORY_CARE_PROVIDER_SITE_OTHER): Payer: Medicare Other | Admitting: Internal Medicine

## 2013-07-23 VITALS — BP 126/82 | HR 84 | Temp 98.2°F | Wt 221.4 lb

## 2013-07-23 DIAGNOSIS — T700XXA Otitic barotrauma, initial encounter: Secondary | ICD-10-CM | POA: Diagnosis not present

## 2013-07-23 DIAGNOSIS — J309 Allergic rhinitis, unspecified: Secondary | ICD-10-CM

## 2013-07-23 MED ORDER — PREDNISONE 10 MG PO TABS: ORAL_TABLET | ORAL | Status: DC

## 2013-07-23 NOTE — Patient Instructions (Signed)
Allergic Rhinitis Allergic rhinitis is when the mucous membranes in the nose respond to allergens. Allergens are particles in the air that cause your body to have an allergic reaction. This causes you to release allergic antibodies. Through a chain of events, these eventually cause you to release histamine into the blood stream (hence the use of antihistamines). Although meant to be protective to the body, it is this release that causes your discomfort, such as frequent sneezing, congestion and an itchy runny nose.  CAUSES  The pollen allergens may come from grasses, trees, and weeds. This is seasonal allergic rhinitis, or "hay fever." Other allergens cause year-round allergic rhinitis (perennial allergic rhinitis) such as house dust mite allergen, pet dander and mold spores.  SYMPTOMS   Nasal stuffiness (congestion).  Runny, itchy nose with sneezing and tearing of the eyes.  There is often an itching of the mouth, eyes and ears. It cannot be cured, but it can be controlled with medications. DIAGNOSIS  If you are unable to determine the offending allergen, skin or blood testing may find it. TREATMENT   Avoid the allergen.  Medications and allergy shots (immunotherapy) can help.  Hay fever may often be treated with antihistamines in pill or nasal spray forms. Antihistamines block the effects of histamine. There are over-the-counter medicines that may help with nasal congestion and swelling around the eyes. Check with your caregiver before taking or giving this medicine. If the treatment above does not work, there are many new medications your caregiver can prescribe. Stronger medications may be used if initial measures are ineffective. Desensitizing injections can be used if medications and avoidance fails. Desensitization is when a patient is given ongoing shots until the body becomes less sensitive to the allergen. Make sure you follow up with your caregiver if problems continue. SEEK MEDICAL  CARE IF:   You develop fever (more than 100.5 F (38.1 C).  You develop a cough that does not stop easily (persistent).  You have shortness of breath.  You start wheezing.  Symptoms interfere with normal daily activities. Document Released: 06/26/2001 Document Revised: 12/24/2011 Document Reviewed: 01/05/2009 [employer] Patient Information 2014 [employer].  

## 2013-07-23 NOTE — Progress Notes (Signed)
HPI  Pt presents to the clinic today with c/o nasal congestion and dizziness. This started about 1 month ago. She reports the dizziness is worse when she is laying down. She was treated for Bilateral otitis media with ceftin x 7 days. She did have resolution of her symptoms while on the antibiotic but they have returned as soon as she stopped it. She has not tried anything else OTC> She does have a history of allergies but does not take an antihistamine daily. She has not had sick contacts. She denies chest pain, chest tightness or shortness of breath.  Review of Systems      Past Medical History  Diagnosis Date  . Allergy   . Atrial fibrillation   . Migraines     on Zoloft for migraines  . Hypertension   . Chronic interstitial nephritis   . Depression   . Palpitations   . Obesity   . Arthritis     ON BOTH KNEES    Family History  Problem Relation Age of Onset  . Cancer Brother     PANCREATIC    History   Social History  . Marital Status: Married    Spouse Name: N/A    Number of Children: N/A  . Years of Education: N/A   Occupational History  . Not on file.   Social History Main Topics  . Smoking status: Never Smoker   . Smokeless tobacco: Never Used  . Alcohol Use: 8.4 oz/week    14 Glasses of wine per week     Comment: WINE- TWO GLASSES AT [employer]NER  . Drug Use: Yes  . Sexual Activity: Yes   Other Topics Concern  . Not on file   Social History Narrative  . No narrative on file    Allergies  Allergen Reactions  . Aspirin     Can take the coated 325 mg. Plain asa 325 mg speeds up the heart.  . Penicillins     She took amoxicillin in 2012-2013 and was ok  . Epinephrine Palpitations     Constitutional: Positive headache, fatigue and fever. Denies fever or abrupt weight changes.  HEENT:  Positive sore throat. Denies eye redness, eye pain, pressure behind the eyes, facial pain, nasal congestion, ear pain, ringing in the ears, wax buildup, runny nose or  bloody nose. Respiratory:  Denies cough difficulty breathing or shortness of breath.  Cardiovascular: Denies chest pain, chest tightness, palpitations or swelling in the hands or feet.   No other specific complaints in a complete review of systems (except as listed in HPI above).  Objective:   BP 126/82  Pulse 84  Temp(Src) 98.2 F (36.8 C) (Oral)  Wt 221 lb 6.4 oz (100.426 kg)  BMI 35.75 kg/m2  SpO2 98% Wt Readings from Last 3 Encounters:  07/23/13 221 lb 6.4 oz (100.426 kg)  07/03/13 219 lb (99.338 kg)  06/11/13 221 lb 9.6 oz (100.517 kg)     General: Appears her stated age, well developed, well nourished in NAD. HEENT: Head: normal shape and size; Eyes: sclera white, no icterus, conjunctiva pink, PERRLA and EOMs intact; Ears: Tm's gray and intact, normal light reflex, + bilateral serous effusion; Nose: mucosa boggy and moist, septum midline; Throat/Mouth: + PND. Teeth present, mucosa erythematous and moist, no exudate noted, no lesions or ulcerations noted.  Neck: Neck supple, trachea midline. No massses, lumps or thyromegaly present.  Cardiovascular: Normal rate and rhythm. S1,S2 noted.  No murmur, rubs or gallops noted. No JVD or  BLE edema. No carotid bruits noted. Pulmonary/Chest: Normal effort and positive vesicular breath sounds. No respiratory distress. No wheezes, rales or ronchi noted.      Assessment & Plan:   Barotitis media secondary to allergic rhinits:  Get some rest and drink plenty of water Do salt water gargles for the sore throat eRx for pred taper Take zyrtec 10 mg daily OTC  RTC as needed or if symptoms persist or worsen

## 2013-07-28 ENCOUNTER — Other Ambulatory Visit: Payer: Self-pay | Admitting: Internal Medicine

## 2013-07-28 DIAGNOSIS — H6983 Other specified disorders of Eustachian tube, bilateral: Secondary | ICD-10-CM

## 2013-07-28 DIAGNOSIS — H6993 Unspecified Eustachian tube disorder, bilateral: Secondary | ICD-10-CM

## 2013-07-30 DIAGNOSIS — R42 Dizziness and giddiness: Secondary | ICD-10-CM | POA: Diagnosis not present

## 2013-07-30 DIAGNOSIS — H8110 Benign paroxysmal vertigo, unspecified ear: Secondary | ICD-10-CM | POA: Diagnosis not present

## 2013-08-05 DIAGNOSIS — R42 Dizziness and giddiness: Secondary | ICD-10-CM | POA: Diagnosis not present

## 2013-08-21 DIAGNOSIS — R42 Dizziness and giddiness: Secondary | ICD-10-CM | POA: Diagnosis not present

## 2013-08-21 DIAGNOSIS — H8110 Benign paroxysmal vertigo, unspecified ear: Secondary | ICD-10-CM | POA: Diagnosis not present

## 2013-10-05 DIAGNOSIS — L658 Other specified nonscarring hair loss: Secondary | ICD-10-CM | POA: Diagnosis not present

## 2013-10-05 DIAGNOSIS — L74519 Primary focal hyperhidrosis, unspecified: Secondary | ICD-10-CM | POA: Diagnosis not present

## 2013-11-10 ENCOUNTER — Ambulatory Visit (INDEPENDENT_AMBULATORY_CARE_PROVIDER_SITE_OTHER): Payer: Medicare Other | Admitting: Nurse Practitioner

## 2013-11-10 ENCOUNTER — Telehealth: Payer: Self-pay | Admitting: *Deleted

## 2013-11-10 ENCOUNTER — Encounter: Payer: Self-pay | Admitting: Nurse Practitioner

## 2013-11-10 VITALS — BP 150/80 | HR 81 | Ht 66.0 in | Wt 227.1 lb

## 2013-11-10 DIAGNOSIS — R0600 Dyspnea, unspecified: Secondary | ICD-10-CM

## 2013-11-10 DIAGNOSIS — R0989 Other specified symptoms and signs involving the circulatory and respiratory systems: Secondary | ICD-10-CM | POA: Diagnosis not present

## 2013-11-10 DIAGNOSIS — R0609 Other forms of dyspnea: Secondary | ICD-10-CM

## 2013-11-10 DIAGNOSIS — R109 Unspecified abdominal pain: Secondary | ICD-10-CM

## 2013-11-10 DIAGNOSIS — R5383 Other fatigue: Secondary | ICD-10-CM

## 2013-11-10 DIAGNOSIS — R079 Chest pain, unspecified: Secondary | ICD-10-CM | POA: Diagnosis not present

## 2013-11-10 DIAGNOSIS — R5381 Other malaise: Secondary | ICD-10-CM | POA: Diagnosis not present

## 2013-11-10 LAB — CBC
HCT: 40.4 % (ref 36.0–46.0)
Hemoglobin: 13.7 g/dL (ref 12.0–15.0)
MCHC: 34 g/dL (ref 30.0–36.0)
MCV: 99.9 fl (ref 78.0–100.0)
Platelets: 189 10*3/uL (ref 150.0–400.0)
RBC: 4.04 Mil/uL (ref 3.87–5.11)
RDW: 13.4 % (ref 11.5–14.6)
WBC: 6.3 10*3/uL (ref 4.5–10.5)

## 2013-11-10 LAB — BASIC METABOLIC PANEL
BUN: 12 mg/dL (ref 6–23)
CO2: 27 mEq/L (ref 19–32)
Calcium: 9.6 mg/dL (ref 8.4–10.5)
Chloride: 102 mEq/L (ref 96–112)
Creatinine, Ser: 1 mg/dL (ref 0.4–1.2)
GFR: 56.82 mL/min — ABNORMAL LOW (ref >60.00)
Glucose, Bld: 99 mg/dL (ref 70–99)
Potassium: 5.3 mEq/L — ABNORMAL HIGH (ref 3.5–5.1)
Sodium: 136 mEq/L (ref 135–145)

## 2013-11-10 LAB — TSH: TSH: 2.75 u[IU]/mL (ref 0.35–5.50)

## 2013-11-10 LAB — LIPASE: Lipase: 36 U/L (ref 11.0–59.0)

## 2013-11-10 LAB — AMYLASE: Amylase: 30 U/L (ref 27–131)

## 2013-11-10 NOTE — Telephone Encounter (Signed)
S/w pt confirmed appt was changed to 3:00 today, pt will be here

## 2013-11-10 NOTE — Patient Instructions (Addendum)
Stay on your current medicines for now  We will check lab today  We will update your echocardiogram and arrange for [place]  Will send you to Dr. Gwenette Greet to get a sleep study arranged at your convenience  Call the [place] office at [phone] if you have any questions, problems or concerns.

## 2013-11-10 NOTE — Progress Notes (Signed)
[name] Date of Birth: 1946/03/12 Medical Record #[id number]  History of Present Illness: [name] is seen back today for a work in visit. Seen for Dr. [name]. Has obesity and PAF.   Seen by me back in December of 2013. Last visit with Dr. [name] back in August of 2014 - was doing ok - had had some palpitations in the setting of increased alcohol use. Sleep study was to be arranged - but she did not follow thru.   Comes back today. Here alone. Notes that she has had some palpitations off and on since last visit here but overall was doing ok until this past Saturday. Does endorse chronic fatigue and DOE that may be progressive. Notes that it has been an anxious time (new [place]). 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 Not really able to describe further. Her chest hurt. Her back and shoulders hurt. Upper back still sore. She felt ice cold and was almost chattering. She was fine the following day. She had no fever or cough. BP usually better at home. Admits that she is probably drinking too much wine. Working with a Clinical research associate but only twice a week. Weight continues to climb.   Current Outpatient Prescriptions  Medication Sig Dispense Refill  . aspirin EC 325 MG tablet Take 325 mg by mouth daily.      [name] atenolol (TENORMIN) 50 MG tablet Take 1 tablet (50 mg total) by mouth daily.  90 tablet  3  . cholecalciferol (VITAMIN D) 1000 UNITS tablet Take 1,000 Units by mouth daily.      [name] diltiazem (DILACOR XR) 180 MG 24 hr capsule Take 1 capsule (180 mg total) by mouth daily.  90 capsule  3  . estrogens, conjugated, (PREMARIN) 0.3 MG tablet Take 1 tablet (0.3 mg total) by mouth daily.  30 tablet  11  . finasteride (PROPECIA) 1 MG tablet Take 1 mg by mouth daily.      . fluocinonide cream (LIDEX) 4.81 % Apply 1 application topically 2 (two) times daily.      [name] glycopyrrolate (ROBINUL) 1 MG tablet Take 1 mg by mouth 2 (two) times daily.      . hydroxychloroquine (PLAQUENIL)  200 MG tablet Take 200 mg by mouth 2 (two) times daily.      . medroxyPROGESTERone (PROVERA) 10 MG tablet Take one tablet daily for 10 days of the month  10 tablet  11  . METROGEL 1 % gel Apply 1 application topically daily as needed (for rosacea).       . sertraline (ZOLOFT) 50 MG tablet Take 1 tablet (50 mg total) by mouth daily.  90 tablet  3  . tacrolimus (PROTOPIC) 0.1 % ointment Apply topically 2 (two) times daily.       No current facility-administered medications for this visit.    Allergies  Allergen Reactions  . Aspirin     Can take the coated 325 mg. Plain asa 325 mg speeds up the heart.  . Penicillins     She took amoxicillin in 2012-2013 and was ok  . Epinephrine Palpitations    Past Medical History  Diagnosis Date  . Allergy   . Atrial fibrillation   . Migraines     on Zoloft for migraines  . Hypertension   . Chronic interstitial nephritis   . Depression   . Palpitations   . Obesity   . Arthritis     ON BOTH KNEES    Past Surgical History  Procedure Laterality Date  . Cesarean section      History  Smoking status  . Never Smoker   Smokeless tobacco  . Never Used    History  Alcohol Use  . 8.4 oz/week  . 14 Glasses of wine per week    Comment: WINE- TWO GLASSES AT [employer]NER    Family History  Problem Relation Age of Onset  . Cancer Brother     PANCREATIC    Review of Systems: The review of systems is per the HPI.  All other systems were reviewed and are negative.  Physical Exam: BP 150/80  Pulse 81  Ht 5\' 6"  (1.676 m)  Wt 227 lb 1.9 oz (103.021 kg)  BMI 36.68 kg/m2 Patient is very pleasant and in no acute distress. She is obese. Skin is warm and dry. Color is normal.  HEENT is unremarkable. Normocephalic/atraumatic. PERRL. Sclera are nonicteric. Neck is supple. No masses. No JVD. Lungs are clear. Cardiac exam shows a regular rate and rhythm. Palpable soreness elicited across the upper back.  Abdomen is obese but soft. Extremities are  without edema. Gait and ROM are intact. No gross neurologic deficits noted.  Wt Readings from Last 3 Encounters:  11/10/13 227 lb 1.9 oz (103.021 kg)  07/23/13 221 lb 6.4 oz (100.426 kg)  07/03/13 219 lb (99.338 kg)     LABORATORY DATA: EKG today shows sinus rhythm.    Lab Results  Component Value Date   WBC 4.8 06/11/2013   HGB 13.3 06/11/2013   HCT 39.9 06/11/2013   PLT 192 06/11/2013   GLUCOSE 97 06/11/2013   CHOL 188 06/11/2013   TRIG 71 06/11/2013   HDL 81 06/11/2013   LDLCALC 93 06/11/2013   ALT 35 06/11/2013   AST 31 06/11/2013   NA 133* 06/11/2013   K 5.1 06/11/2013   CL 99 06/11/2013   CREATININE 0.86 06/11/2013   BUN 16 06/11/2013   CO2 22 06/11/2013   TSH 2.132 06/11/2013   INR 0.9 09/21/2008   HGBA1C 5.5 02/20/2008   ECHO SUMMARY - Overall left ventricular systolic function was normal. Left ventricular ejection fraction was estimated , range being 60 % to 70 %. - Mild MR directed posteriorly in LA.  ---------------------------------------------------------------  Prepared and Electronically Authenticated by  [name] M.D. Confirmed 04-Jun-2008 19:10:11   Assessment / Plan: 1. Atypical chest pain - has palpable soreness across her upper back - the events from Saturday seem like that may have been viral or gallbladder - she has had worsening fatigue/DOE - will arrange for Myoview (not able to walk very far) - needs to start working on CV risk factor modification.   2. PAF - in sinus today  3. DOE - suspect this is more multifactorial from weight/sedentary lifestyle, etc - will check echo.   4. Alcohol - probably in excess - discussed cutting back with her.   We will check labs today. Arrange for echo and Myoview. I think a lot of this will come down to her taking better care of herself.   Patient is agreeable to this plan and will call if any problems develop in the interim.   Burtis Junes, RN, [facility] [address] [place], [place]  [id number] [phone]

## 2013-11-10 NOTE — Telephone Encounter (Signed)
lmovm pt is being seen today but Cecille Rubin did not have availability when pt was scheduled, lmovm for pt to come in at 3:00 pm today, 1/27 will call back to confirm

## 2013-11-20 DIAGNOSIS — H8110 Benign paroxysmal vertigo, unspecified ear: Secondary | ICD-10-CM | POA: Diagnosis not present

## 2013-11-26 DIAGNOSIS — H8110 Benign paroxysmal vertigo, unspecified ear: Secondary | ICD-10-CM | POA: Diagnosis not present

## 2013-11-30 ENCOUNTER — Encounter (HOSPITAL_COMMUNITY): Payer: Medicare Other

## 2013-12-01 ENCOUNTER — Other Ambulatory Visit (HOSPITAL_COMMUNITY): Payer: Medicare Other

## 2013-12-03 ENCOUNTER — Institutional Professional Consult (permissible substitution): Payer: Medicare Other | Admitting: Pulmonary Disease

## 2013-12-08 ENCOUNTER — Institutional Professional Consult (permissible substitution): Payer: Medicare Other | Admitting: Pulmonary Disease

## 2013-12-08 ENCOUNTER — Other Ambulatory Visit (HOSPITAL_COMMUNITY): Payer: Medicare Other

## 2013-12-09 ENCOUNTER — Ambulatory Visit (INDEPENDENT_AMBULATORY_CARE_PROVIDER_SITE_OTHER): Payer: Medicare Other | Admitting: Pulmonary Disease

## 2013-12-09 ENCOUNTER — Encounter: Payer: Self-pay | Admitting: Pulmonary Disease

## 2013-12-09 VITALS — BP 142/82 | HR 79 | Temp 98.1°F | Ht 66.0 in | Wt 228.8 lb

## 2013-12-09 DIAGNOSIS — R0609 Other forms of dyspnea: Secondary | ICD-10-CM

## 2013-12-09 DIAGNOSIS — R0989 Other specified symptoms and signs involving the circulatory and respiratory systems: Secondary | ICD-10-CM

## 2013-12-09 DIAGNOSIS — R0683 Snoring: Secondary | ICD-10-CM | POA: Insufficient documentation

## 2013-12-09 NOTE — Progress Notes (Signed)
Subjective:    Patient ID: Amanda Wilkins, female    DOB: [birth year], 68 y.o.   MRN: [id number]  HPI The patient is a 68 year old female who I've been asked to see for possible obstructive sleep apnea.  She has been noted by her husband to have loud snoring, but he has never mentioned an abnormal breathing pattern during sleep. She does have rare episodes of startling awake during the night, but this has not happened in quite some time. She awakens approximately 2 times a night to go to the bathroom, but does not feel rested in the mornings upon arising. She tells me this has been an issue for 20 years. Despite this, she adamantly denies any inappropriate daytime sleepiness with inactivity, but does have some sleepiness in the evenings watching television or movies. She has no sleepiness with driving. The patient states that her weight is up 14 pounds over the last 2 years, and her Epworth score today is only 4.   Sleep Questionnaire What time do you typically go to bed?( Between what hours) 10-11pm 10-11pm at 0957 on 12/09/13 by Virl Cagey, CMA How long does it take you to fall asleep? 15-33mins 15-87mins at 0957 on 12/09/13 by Virl Cagey, CMA How many times during the night do you wake up? 2 2 at 0957 on 12/09/13 by Virl Cagey, CMA What time do you get out of bed to start your day? 0900 0900 at 0957 on 12/09/13 by Virl Cagey, CMA Do you drive or operate heavy machinery in your occupation? No No at 0957 on 12/09/13 by Virl Cagey, CMA How much has your weight changed (up or down) over the past two years? (In pounds) 14 lb (6.35 kg) 14 lb (6.35 kg) at 0957 on 12/09/13 by Virl Cagey, CMA Have you ever had a sleep study before? No No at 0957 on 12/09/13 by Virl Cagey, CMA Do you currently use CPAP? No No at 0957 on 12/09/13 by Virl Cagey, CMA Do you wear oxygen at any time? No No at 0957 on 12/09/13 by Virl Cagey, CMA   Review of Systems   Constitutional: Negative for fever and unexpected weight change.  HENT: Positive for congestion, postnasal drip, rhinorrhea and sore throat. Negative for dental problem, ear pain, nosebleeds, sinus pressure, sneezing and trouble swallowing.   Eyes: Negative for redness and itching.  Respiratory: Positive for cough. Negative for chest tightness, shortness of breath and wheezing.   Cardiovascular: Negative for palpitations and leg swelling.  Gastrointestinal: Negative for nausea and vomiting.  Genitourinary: Negative for dysuria.  Musculoskeletal: Negative for joint swelling.  Skin: Negative for rash.  Neurological: Negative for headaches.  Hematological: Does not bruise/bleed easily.  Psychiatric/Behavioral: Positive for dysphoric mood. The patient is nervous/anxious.        Objective:   Physical Exam Constitutional:  Overweight female, no acute distress  HENT:  Nares patent without discharge, mild crusting noted.   Oropharynx without exudate, palate and uvula are mildly elongated.   Eyes:  Perrla, eomi, no scleral icterus  Neck:  No JVD, no TMG  Cardiovascular:  Normal rate, regular rhythm, no rubs or gallops.  No murmurs        Intact distal pulses  Pulmonary :  Normal breath sounds, no stridor or respiratory distress   No rales, rhonchi, or wheezing  Abdominal:  Soft, nondistended, bowel sounds present.  No tenderness noted.   Musculoskeletal:  Minimal lower extremity edema noted.  Lymph Nodes:  No cervical lymphadenopathy noted  Skin:  No cyanosis noted  Neurologic:  Alert, appropriate, moves all 4 extremities without obvious deficit.         Assessment & Plan:

## 2013-12-09 NOTE — Patient Instructions (Signed)
Work on weight loss over the next 79mos.  If you are not able to lose 20-25 pounds over that time period, would recommend a sleep study for evaluation.  If you decide between now and the 16mos to proceed with a sleep study, please call us.

## 2013-12-09 NOTE — Assessment & Plan Note (Signed)
The patient's history is somewhat suspicious for possible sleep-disordered breathing, however she denies any significant sleepiness during the day. She does not feel this is significantly impacting her quality of life at this time. It is unclear whether she does have clinically significant sleep apnea or not, but I certainly don't think this is moderate to severe in nature if she does. I have outlined a conservative approach with a trial of weight loss over the next 6 months, and if she is not successful, to proceed with a sleep study. If she is able to lose weight, no further evaluation would be necessary. I have also outlined a more aggressive approach where we will proceed with home sleep testing to put the issue to rest. After a long discussion, the patient would like to take the next 4-6 months to work on weight loss. She will call me if she is not able to do so, or if she decides to proceed with sleep testing.

## 2013-12-12 ENCOUNTER — Encounter: Payer: Self-pay | Admitting: Internal Medicine

## 2013-12-12 ENCOUNTER — Ambulatory Visit (INDEPENDENT_AMBULATORY_CARE_PROVIDER_SITE_OTHER): Payer: Medicare Other | Admitting: Internal Medicine

## 2013-12-12 VITALS — BP 122/82 | HR 89 | Temp 100.5°F | Ht 66.0 in | Wt 224.0 lb

## 2013-12-12 DIAGNOSIS — J209 Acute bronchitis, unspecified: Secondary | ICD-10-CM

## 2013-12-12 MED ORDER — AZITHROMYCIN 250 MG PO TABS: ORAL_TABLET | ORAL | Status: DC

## 2013-12-12 NOTE — Patient Instructions (Signed)
Acute Bronchitis Bronchitis is inflammation of the airways that extend from the windpipe into the lungs (bronchi). The inflammation often causes mucus to develop. This leads to a cough, which is the most common symptom of bronchitis.  In acute bronchitis, the condition usually develops suddenly and goes away over time, usually in a couple weeks. Smoking, allergies, and asthma can make bronchitis worse. Repeated episodes of bronchitis may cause further lung problems.  CAUSES Acute bronchitis is most often caused by the same virus that causes a cold. The virus can spread from person to person (contagious).  SIGNS AND SYMPTOMS   Cough.   Fever.   Coughing up mucus.   Body aches.   Chest congestion.   Chills.   Shortness of breath.   Sore throat.  DIAGNOSIS  Acute bronchitis is usually diagnosed through a physical exam. Tests, such as chest X-rays, are sometimes done to rule out other conditions.  TREATMENT  Acute bronchitis usually goes away in a couple weeks. Often times, no medical treatment is necessary. Medicines are sometimes given for relief of fever or cough. Antibiotics are usually not needed but may be prescribed in certain situations. In some cases, an inhaler may be recommended to help reduce shortness of breath and control the cough. A cool mist vaporizer may also be used to help thin bronchial secretions and make it easier to clear the chest.  HOME CARE INSTRUCTIONS  Get plenty of rest.   Drink enough fluids to keep your urine clear or pale yellow (unless you have a medical condition that requires fluid restriction). Increasing fluids may help thin your secretions and will prevent dehydration.   Only take over-the-counter or prescription medicines as directed by your health care provider.   Avoid smoking and secondhand smoke. Exposure to cigarette smoke or irritating chemicals will make bronchitis worse. If you are a smoker, consider using nicotine gum or skin  patches to help control withdrawal symptoms. Quitting smoking will help your lungs heal faster.   Reduce the chances of another bout of acute bronchitis by washing your hands frequently, avoiding people with cold symptoms, and trying not to touch your hands to your mouth, nose, or eyes.   Follow up with your health care provider as directed.  SEEK MEDICAL CARE IF: Your symptoms do not improve after 1 week of treatment.  SEEK IMMEDIATE MEDICAL CARE IF:  You develop an increased fever or chills.   You have chest pain.   You have severe shortness of breath.  You have bloody sputum.   You develop dehydration.  You develop fainting.  You develop repeated vomiting.  You develop a severe headache. MAKE SURE YOU:   Understand these instructions.  Will watch your condition.  Will get help right away if you are not doing well or get worse. Document Released: 11/08/2004 Document Revised: 06/03/2013 Document Reviewed: 03/24/2013 [employer] Patient Information 2014 [employer].  

## 2013-12-12 NOTE — Progress Notes (Signed)
Pre visit review using our clinic review tool, if applicable. No additional management support is needed unless otherwise documented below in the visit note. 

## 2013-12-12 NOTE — Progress Notes (Signed)
HPI  Pt presents to the clinic today with c/o cough, wheezing, sore throat and fevers. This started about 1 week ago. Her symptoms have gotten much worse in the last 2 days. The cough is unproductive, worse at night. She has tried Coricidin HBP OTC. She does have a history of seasonal allergies. She has had sick contacts.  Review of Systems      Past Medical History  Diagnosis Date  . Allergy   . Atrial fibrillation   . Migraines     on Zoloft for migraines  . Hypertension   . Chronic interstitial nephritis   . Depression   . Palpitations   . Obesity   . Arthritis     ON BOTH KNEES    Family History  Problem Relation Age of Onset  . Cancer Brother     PANCREATIC    History   Social History  . Marital Status: Married    Spouse Name: N/A    Number of Children: N/A  . Years of Education: N/A   Occupational History  . retired   . Part-time (with temp agency)    Social History Main Topics  . Smoking status: Never Smoker   . Smokeless tobacco: Never Used     Comment: H/O social smoking at times when drinking--never a "habit"  . Alcohol Use: 8.4 oz/week    14 Glasses of wine per week     Comment: WINE- TWO GLASSES AT [employer]  . Drug Use: No  . Sexual Activity: Yes   Other Topics Concern  . Not on file   Social History Narrative  . No narrative on file    Allergies  Allergen Reactions  . Aspirin     Can take the coated 325 mg. Plain asa 325 mg speeds up the heart.  . Penicillins     She took amoxicillin in 2012-2013 and was ok  . Epinephrine Palpitations     Constitutional: Positive headache, fatigue and fever. Denies abrupt weight changes.  HEENT:  Positive sore throat. Denies eye redness, eye pain, pressure behind the eyes, facial pain, nasal congestion, ear pain, ringing in the ears, wax buildup, runny nose or bloody nose. Respiratory: Positive cough. Denies difficulty breathing or shortness of breath.  Cardiovascular: Denies chest pain, chest  tightness, palpitations or swelling in the hands or feet.   No other specific complaints in a complete review of systems (except as listed in HPI above).  Objective:   BP 122/82  Pulse 89  Temp(Src) 100.5 F (38.1 C) (Oral)  Ht 5\' 6"  (1.676 m)  Wt 224 lb (101.606 kg)  BMI 36.17 kg/m2  SpO2 97% Wt Readings from Last 3 Encounters:  12/12/13 224 lb (101.606 kg)  12/09/13 228 lb 12.8 oz (103.783 kg)  11/10/13 227 lb 1.9 oz (103.021 kg)     General: Appears her stated age, well developed, well nourished in NAD. HEENT: Head: normal shape and size; Eyes: sclera white, no icterus, conjunctiva pink, PERRLA and EOMs intact; Ears: Tm's gray and intact, normal light reflex; Nose: mucosa pink and moist, septum midline; Throat/Mouth: + PND. Teeth present, mucosa erythematous and moist, no exudate noted, no lesions or ulcerations noted.  Neck: Neck supple, trachea midline. No massses, lumps or thyromegaly present.  Cardiovascular: Normal rate and rhythm. S1,S2 noted.  No murmur, rubs or gallops noted. No JVD or BLE edema. No carotid bruits noted. Pulmonary/Chest: Normal effort and scattered rhonchi throughout. No respiratory distress. No wheezes, rales or noted.  Assessment & Plan:   Acute Bronchitis:  Get some rest and drink plenty of water Do salt water gargles for the sore throat eRx for Azithromax x 5 days Tylenol for fever, body aches Continue Coricidin HBP for cough  RTC as needed or if symptoms persist.

## 2013-12-18 ENCOUNTER — Ambulatory Visit (INDEPENDENT_AMBULATORY_CARE_PROVIDER_SITE_OTHER)
Admission: RE | Admit: 2013-12-18 | Discharge: 2013-12-18 | Disposition: A | Discharge status: Home / Self Care | Payer: Medicare Other | Source: Ambulatory Visit | Attending: Internal Medicine | Admitting: Internal Medicine

## 2013-12-18 ENCOUNTER — Ambulatory Visit (INDEPENDENT_AMBULATORY_CARE_PROVIDER_SITE_OTHER): Payer: Medicare Other | Admitting: Internal Medicine

## 2013-12-18 ENCOUNTER — Encounter: Payer: Self-pay | Admitting: Internal Medicine

## 2013-12-18 VITALS — BP 128/86 | HR 76 | Temp 98.3°F | Resp 16 | Wt 223.0 lb

## 2013-12-18 DIAGNOSIS — R059 Cough, unspecified: Secondary | ICD-10-CM | POA: Diagnosis not present

## 2013-12-18 DIAGNOSIS — J9819 Other pulmonary collapse: Secondary | ICD-10-CM | POA: Diagnosis not present

## 2013-12-18 DIAGNOSIS — R05 Cough: Secondary | ICD-10-CM

## 2013-12-18 DIAGNOSIS — J398 Other specified diseases of upper respiratory tract: Secondary | ICD-10-CM | POA: Diagnosis not present

## 2013-12-18 DIAGNOSIS — I4891 Unspecified atrial fibrillation: Secondary | ICD-10-CM | POA: Diagnosis not present

## 2013-12-18 DIAGNOSIS — J399 Disease of upper respiratory tract, unspecified: Secondary | ICD-10-CM

## 2013-12-18 MED ORDER — VALACYCLOVIR HCL 500 MG PO TABS: 500.0000 mg | ORAL_TABLET | Freq: Two times a day (BID) | ORAL | Status: DC

## 2013-12-18 MED ORDER — LEVOFLOXACIN 500 MG PO TABS: 500.0000 mg | ORAL_TABLET | Freq: Every day | ORAL | Status: DC

## 2013-12-18 MED ORDER — TRIAMCINOLONE ACETONIDE 0.5 % EX OINT: 1.0000 "application " | TOPICAL_OINTMENT | Freq: Four times a day (QID) | CUTANEOUS | Status: DC

## 2013-12-18 MED ORDER — PROMETHAZINE-CODEINE 6.25-10 MG/5ML PO SYRP: 5.0000 mL | ORAL_SOLUTION | ORAL | Status: DC | PRN

## 2013-12-18 NOTE — Progress Notes (Signed)
Pre visit review using our clinic review tool, if applicable. No additional management support is needed unless otherwise documented below in the visit note. 

## 2013-12-18 NOTE — Progress Notes (Signed)
   Subjective:    HPI    The patient has been doing well overall without major physical or psychological issues going on lately.  The patient needs to address  chronic palpitations, alopecia  BP Readings from Last 3 Encounters:  12/18/13 128/86  12/12/13 122/82  12/09/13 142/82   Wt Readings from Last 3 Encounters:  12/18/13 223 lb (101.152 kg)  12/12/13 224 lb (101.606 kg)  12/09/13 228 lb 12.8 oz (103.783 kg)       Review of Systems  Constitutional: Negative for chills, activity change, appetite change, fatigue and unexpected weight change.  HENT: Negative for congestion, mouth sores and sinus pressure.   Eyes: Negative for visual disturbance.  Respiratory: Negative for cough and chest tightness.   Cardiovascular: Positive for palpitations. Negative for leg swelling.  Gastrointestinal: Negative for nausea and abdominal pain.  Genitourinary: Negative for dysuria, urgency, frequency, hematuria, difficulty urinating and vaginal pain.  Musculoskeletal: Negative for back pain, gait problem and myalgias.  Skin: Negative for pallor and rash.       Hair loss  Allergic/Immunologic: Positive for environmental allergies.  Neurological: Negative for dizziness, tremors, weakness, numbness and headaches.  Psychiatric/Behavioral: Negative for suicidal ideas, confusion and sleep disturbance.       Objective:   Physical Exam  Constitutional: She appears well-developed. No distress.  Obese   HENT:  Head: Normocephalic.  Right Ear: External ear normal.  Left Ear: External ear normal.  Nose: Nose normal.  Mouth/Throat: Oropharynx is clear and moist.  Eyes: Conjunctivae are normal. Pupils are equal, round, and reactive to light. Right eye exhibits no discharge. Left eye exhibits no discharge.  Neck: Normal range of motion. Neck supple. No JVD present. No tracheal deviation present. No thyromegaly present.  Cardiovascular: Normal rate, regular rhythm and normal heart sounds.   No  murmur heard. Pulmonary/Chest: No stridor. No respiratory distress. She has no wheezes. She has no rales.  Abdominal: Soft. Bowel sounds are normal. She exhibits no distension and no mass. There is no tenderness. There is no rebound and no guarding.  Musculoskeletal: She exhibits no edema and no tenderness.  Lymphadenopathy:    She has no cervical adenopathy.  Neurological: She displays normal reflexes. No cranial nerve deficit. She exhibits normal muscle tone. Coordination normal.  Skin: No rash noted. No erythema.  alopecia  Psychiatric: She has a normal mood and affect. Her behavior is normal. Judgment and thought content normal.   Lab Results  Component Value Date   WBC 6.3 11/10/2013   HGB 13.7 11/10/2013   HCT 40.4 11/10/2013   PLT 189.0 11/10/2013   GLUCOSE 99 11/10/2013   CHOL 188 06/11/2013   TRIG 71 06/11/2013   HDL 81 06/11/2013   LDLCALC 93 06/11/2013   ALT 35 06/11/2013   AST 31 06/11/2013   NA 136 11/10/2013   K 5.3* 11/10/2013   CL 102 11/10/2013   CREATININE 1.0 11/10/2013   BUN 12 11/10/2013   CO2 27 11/10/2013   TSH 2.75 11/10/2013   INR 0.9 09/21/2008   HGBA1C 5.5 02/20/2008           Assessment & Plan:

## 2013-12-19 ENCOUNTER — Encounter: Payer: Self-pay | Admitting: Internal Medicine

## 2013-12-22 DIAGNOSIS — H04129 Dry eye syndrome of unspecified lacrimal gland: Secondary | ICD-10-CM | POA: Diagnosis not present

## 2013-12-22 DIAGNOSIS — H2510 Age-related nuclear cataract, unspecified eye: Secondary | ICD-10-CM | POA: Diagnosis not present

## 2013-12-25 ENCOUNTER — Other Ambulatory Visit: Payer: Self-pay

## 2013-12-25 ENCOUNTER — Ambulatory Visit (HOSPITAL_COMMUNITY): Payer: Medicare Other | Attending: Nurse Practitioner | Admitting: Radiology

## 2013-12-25 DIAGNOSIS — R072 Precordial pain: Secondary | ICD-10-CM | POA: Diagnosis not present

## 2013-12-25 DIAGNOSIS — I4891 Unspecified atrial fibrillation: Secondary | ICD-10-CM | POA: Insufficient documentation

## 2013-12-25 DIAGNOSIS — R0609 Other forms of dyspnea: Secondary | ICD-10-CM | POA: Insufficient documentation

## 2013-12-25 DIAGNOSIS — R079 Chest pain, unspecified: Secondary | ICD-10-CM | POA: Diagnosis not present

## 2013-12-25 DIAGNOSIS — R109 Unspecified abdominal pain: Secondary | ICD-10-CM

## 2013-12-25 DIAGNOSIS — R5383 Other fatigue: Secondary | ICD-10-CM

## 2013-12-25 DIAGNOSIS — R0989 Other specified symptoms and signs involving the circulatory and respiratory systems: Secondary | ICD-10-CM | POA: Insufficient documentation

## 2013-12-25 DIAGNOSIS — R0600 Dyspnea, unspecified: Secondary | ICD-10-CM

## 2013-12-28 NOTE — Assessment & Plan Note (Signed)
See Rx 

## 2013-12-28 NOTE — Progress Notes (Signed)
Echocardiogram performed.  

## 2013-12-28 NOTE — Assessment & Plan Note (Signed)
Continue with current prescription therapy as reflected on the Med list.  

## 2014-01-12 ENCOUNTER — Other Ambulatory Visit: Payer: Self-pay | Admitting: Gynecology

## 2014-01-18 ENCOUNTER — Other Ambulatory Visit: Payer: Self-pay | Admitting: Gynecology

## 2014-01-18 ENCOUNTER — Other Ambulatory Visit: Payer: Self-pay

## 2014-01-18 DIAGNOSIS — Z1231 Encounter for screening mammogram for malignant neoplasm of breast: Secondary | ICD-10-CM

## 2014-01-19 ENCOUNTER — Ambulatory Visit (INDEPENDENT_AMBULATORY_CARE_PROVIDER_SITE_OTHER): Payer: Medicare Other | Admitting: Gynecology

## 2014-01-19 ENCOUNTER — Encounter: Payer: Self-pay | Admitting: Gynecology

## 2014-01-19 VITALS — BP 128/78 | Ht 65.5 in | Wt 225.0 lb

## 2014-01-19 DIAGNOSIS — F32A Depression, unspecified: Secondary | ICD-10-CM

## 2014-01-19 DIAGNOSIS — F329 Major depressive disorder, single episode, unspecified: Secondary | ICD-10-CM

## 2014-01-19 DIAGNOSIS — R635 Abnormal weight gain: Secondary | ICD-10-CM | POA: Diagnosis not present

## 2014-01-19 DIAGNOSIS — N951 Menopausal and female climacteric states: Secondary | ICD-10-CM | POA: Diagnosis not present

## 2014-01-19 DIAGNOSIS — Z79890 Hormone replacement therapy (postmenopausal): Secondary | ICD-10-CM | POA: Diagnosis not present

## 2014-01-19 DIAGNOSIS — F3289 Other specified depressive episodes: Secondary | ICD-10-CM

## 2014-01-19 DIAGNOSIS — Z780 Asymptomatic menopausal state: Secondary | ICD-10-CM

## 2014-01-19 LAB — TSH: TSH: 2.63 u[IU]/mL (ref 0.350–4.500)

## 2014-01-19 MED ORDER — SERTRALINE HCL 50 MG PO TABS: 50.0000 mg | ORAL_TABLET | Freq: Every day | ORAL | Status: DC

## 2014-01-19 MED ORDER — MEDROXYPROGESTERONE ACETATE 10 MG PO TABS: ORAL_TABLET | ORAL | Status: DC

## 2014-01-19 MED ORDER — ESTROGENS CONJUGATED 0.3 MG PO TABS: ORAL_TABLET | ORAL | Status: DC

## 2014-01-19 NOTE — Progress Notes (Signed)
Amanda Wilkins [birth year] [id number]   History:    68 y.o.  for GYN exam with a complaint of feeling depressed despite being on Zoloft 50 mg daily.Dr. Tonia Brooms dermatologist has been following her for her alopecia and due to the fact that she's on Plaquenil she is doing her lab work as well as her liver function test. Her primary physician is Dr. Alain Marion.She currently is on Premarin 0.3 mg daily with the addition of Provera 10 mg for 10 days of each month. Patient had ascus on Pap smear in 2008 and negative HPV. Her followup Pap smears have been negative Review of patient's records indicated that since 2009 she's had a persistent small thinwall echo-free cyst measuring 15 x 11 mm.  Patient would like to try again this year to come off the hormone replacement therapy. Patient's last colonoscopy was normal in 2012.      Past medical history,surgical history, family history and social history were all reviewed and documented in the EPIC chart.  Gynecologic History No LMP recorded. Patient is postmenopausal. Contraception: post menopausal status Last Pap: 2012. Results were: normal Last mammogram: 2014. Results were: normal  Obstetric History OB History  Gravida Para Term Preterm AB SAB TAB Ectopic Multiple Living  2 2 2       2     # Outcome Date GA Lbr Len/2nd Weight Sex Delivery Anes PTL Lv  2 TRM     M SVD  N Y  1 TRM     M CS  N Y       ROS: A ROS was performed and pertinent positives and negatives are included in the history.  GENERAL: No fevers or chills. HEENT: No change in vision, no earache, sore throat or sinus congestion. NECK: No pain or stiffness. CARDIOVASCULAR: No chest pain or pressure. No palpitations. PULMONARY: No shortness of breath, cough or wheeze. GASTROINTESTINAL: No abdominal pain, nausea, vomiting or diarrhea, melena or bright red blood per rectum. GENITOURINARY: No urinary frequency, urgency, hesitancy or dysuria. MUSCULOSKELETAL: No joint or muscle  pain, no back pain, no recent trauma. DERMATOLOGIC: No rash, no itching, no lesions. ENDOCRINE: No polyuria, polydipsia, no heat or cold intolerance. No recent change in weight. HEMATOLOGICAL: No anemia or easy bruising or bleeding. NEUROLOGIC: No headache, seizures, numbness, tingling or weakness. PSYCHIATRIC: No depression, no loss of interest in normal activity or change in sleep pattern.     Exam: chaperone present  BP 128/78  Ht 5' 5.5" (1.664 m)  Wt 225 lb (102.059 kg)  BMI 36.86 kg/m2  Body mass index is 36.86 kg/(m^2).  General appearance : Well developed well nourished female. No acute distress HEENT: Neck supple, trachea midline, no carotid bruits, no thyroidmegaly Lungs: Clear to auscultation, no rhonchi or wheezes, or rib retractions  Heart: Regular rate and rhythm, no murmurs or gallops Breast:Examined in sitting and supine position were symmetrical in appearance, no palpable masses or tenderness,  no skin retraction, no nipple inversion, no nipple discharge, no skin discoloration, no axillary or supraclavicular lymphadenopathy Abdomen: no palpable masses or tenderness, no rebound or guarding Extremities: no edema or skin discoloration or tenderness  Pelvic:  Bartholin, Urethra, Skene Glands: Within normal limits             Vagina: No gross lesions or discharge  Cervix: No gross lesions or discharge  Uterus  anteverted, normal size, shape and consistency, non-tender and mobile  Adnexa  Without masses or tenderness  Anus and perineum  normal  Rectovaginal  normal sphincter tone without palpated masses or tenderness             Hemoccult cards provided     Assessment/Plan:  68 y.o. female with history of depression. We are going to try to increase her Zoloft every other day as follows: She will take 50 mg and alternate with 100 mg every other day. If this works well for her in the following months and she will begin to taper off her HRT. We discussed importance of  calcium vitamin D and regular exercise for osteoporosis prevention. She will be scheduled her mammogram later this month. We discussed importance of monthly self breast examinations. We are going to check her TSH today. She was provided with fecal Hemoccult cards to submit to the office for testing. Pap smear not done today in accordance to the new guidelines.  Note: This dictation was prepared with  Dragon/digital dictation along withSmart phrase technology. Any transcriptional errors that result from this process are unintentional.   Terrance Mass MD, 12:23 PM 01/19/2014

## 2014-01-25 DIAGNOSIS — L74519 Primary focal hyperhidrosis, unspecified: Secondary | ICD-10-CM | POA: Diagnosis not present

## 2014-01-25 DIAGNOSIS — L658 Other specified nonscarring hair loss: Secondary | ICD-10-CM | POA: Diagnosis not present

## 2014-02-11 ENCOUNTER — Ambulatory Visit
Admission: RE | Admit: 2014-02-11 | Discharge: 2014-02-11 | Disposition: A | Discharge status: Home / Self Care | Payer: Medicare Other | Source: Ambulatory Visit

## 2014-02-11 DIAGNOSIS — Z1231 Encounter for screening mammogram for malignant neoplasm of breast: Secondary | ICD-10-CM

## 2014-02-20 ENCOUNTER — Other Ambulatory Visit: Payer: Self-pay | Admitting: Gynecology

## 2014-02-24 DIAGNOSIS — Z79899 Other long term (current) drug therapy: Secondary | ICD-10-CM | POA: Diagnosis not present

## 2014-03-22 ENCOUNTER — Other Ambulatory Visit: Payer: Self-pay | Admitting: Internal Medicine

## 2014-03-24 ENCOUNTER — Encounter: Payer: Self-pay | Admitting: Internal Medicine

## 2014-03-24 ENCOUNTER — Ambulatory Visit (INDEPENDENT_AMBULATORY_CARE_PROVIDER_SITE_OTHER): Payer: Medicare Other | Admitting: Internal Medicine

## 2014-03-24 VITALS — BP 130/78 | HR 79 | Temp 98.1°F | Wt 227.6 lb

## 2014-03-24 DIAGNOSIS — M7122 Synovial cyst of popliteal space [Baker], left knee: Secondary | ICD-10-CM

## 2014-03-24 DIAGNOSIS — M25561 Pain in right knee: Secondary | ICD-10-CM

## 2014-03-24 DIAGNOSIS — M25569 Pain in unspecified knee: Secondary | ICD-10-CM

## 2014-03-24 DIAGNOSIS — M25469 Effusion, unspecified knee: Secondary | ICD-10-CM | POA: Diagnosis not present

## 2014-03-24 DIAGNOSIS — M25462 Effusion, left knee: Secondary | ICD-10-CM

## 2014-03-24 DIAGNOSIS — M7120 Synovial cyst of popliteal space [Baker], unspecified knee: Secondary | ICD-10-CM

## 2014-03-24 MED ORDER — MELOXICAM 7.5 MG PO TABS: ORAL_TABLET | ORAL | Status: DC

## 2014-03-24 NOTE — Progress Notes (Signed)
Pre visit review using our clinic review tool, if applicable. No additional management support is needed unless otherwise documented below in the visit note. 

## 2014-03-24 NOTE — Progress Notes (Signed)
   Subjective:    Patient ID: Amanda Wilkins, female    DOB: [birth year], 68 y.o.   MRN: [id number]  HPI  Over the past 2 weeks she's had aching pain along the medial aspect of the left knee. This varies in intensity from dull-sharp up to a level VIII. It does radiate into the distal thigh as well as into the upper calf on that side. It has become constant  She is on enteric-coated aspirin 325 mg daily as prophylaxis for atrial fib.  She did notice some increased heat in the area of the medial knee over the first 2 days; she denies any specific injury or trigger for the pain. She specifically denies hearing or feeling a "pop" in this area  Approximately 4 weeks ago she did feel that she had a "ball" in the left popliteal space  Review of Systems  There's been no associated redness, swelling, joint stiffness, fever, chills, or sweats.  There's been no change in the color or temperature of the skin other than those initial changes in the first 48 hours  She has no numbness, tingling, weakness of the legs.  She denies any abnormal bruising or bleeding.        Objective:   Physical Exam  She is in no acute distress but somewhat uncomfortable  She has no conjunctivitis or scleral icterus  There is no lymphadenopathy about the neck or axilla  Central and peripheral obesity is present.  She has a regular rhythm without gallop or murmur  Chest is clear to auscultation  She has no tenderness to palpation of the abdomen. She has no organomegaly or masses  Fusiform changes of the knees. Diffusion is noted on the left.  There is some instability of the left knee and slight crepitus  There is tenderness over a popliteal cyst which is palpable on the left.  Homans sign is negative  Pedal pulses are intact.  Range of motion, strength, tone are essentially normal.  She limps slightly on the left leg. She can complete heel and toe walking.        Assessment & Plan:     #1 right knee pain with effusion. Probable medial collateral ligament strain  #2 popliteal cyst  Plan see orders and referral

## 2014-03-24 NOTE — Patient Instructions (Signed)
Use an anti-inflammatory cream such as Aspercreme or Zostrix cream twice a day to the affected area as needed. In lieu of this warm moist compresses or  hot water bottle can be used. Do not apply ice . Stop ASA while on new medication. I recommend you see Dr Charlann Boxer, Sports Medicine specialist., phone # [phone].

## 2014-03-31 DIAGNOSIS — M25569 Pain in unspecified knee: Secondary | ICD-10-CM | POA: Diagnosis not present

## 2014-04-05 ENCOUNTER — Ambulatory Visit: Payer: Medicare Other | Admitting: Family Medicine

## 2014-04-15 DIAGNOSIS — F4323 Adjustment disorder with mixed anxiety and depressed mood: Secondary | ICD-10-CM | POA: Diagnosis not present

## 2014-04-26 ENCOUNTER — Other Ambulatory Visit: Payer: Self-pay | Admitting: Cardiology

## 2014-04-26 DIAGNOSIS — L658 Other specified nonscarring hair loss: Secondary | ICD-10-CM | POA: Diagnosis not present

## 2014-04-26 DIAGNOSIS — L74519 Primary focal hyperhidrosis, unspecified: Secondary | ICD-10-CM | POA: Diagnosis not present

## 2014-04-26 DIAGNOSIS — Z79899 Other long term (current) drug therapy: Secondary | ICD-10-CM | POA: Diagnosis not present

## 2014-04-26 DIAGNOSIS — L439 Lichen planus, unspecified: Secondary | ICD-10-CM | POA: Diagnosis not present

## 2014-04-26 DIAGNOSIS — L659 Nonscarring hair loss, unspecified: Secondary | ICD-10-CM | POA: Diagnosis not present

## 2014-04-29 ENCOUNTER — Telehealth: Payer: Self-pay | Admitting: Internal Medicine

## 2014-04-29 NOTE — Telephone Encounter (Signed)
Spoke with patient and she had steroid injection in her scalp on Monday and yesterday not feeling good. Her heart rate is 80's at rest and up moving around goes to 115. Patient has a history of A Fib but states this does not feel the same. Discussed with Dr Acie Fredrickson and he recommended waiting a couple of days and if no improvement ok to increase Atenolol 50 mg to 1 and 1/2 tablets. Advised patient and if change made to call back and let

## 2014-04-29 NOTE — Telephone Encounter (Signed)
New message    Patient calling stated she call today around 2 pm wanted to be seen today - stated she has not heard anything from the office.     C/O rapid heart beat blood pressure elevated 155/85 .

## 2014-05-04 ENCOUNTER — Ambulatory Visit: Payer: Medicare Other | Admitting: Internal Medicine

## 2014-05-04 DIAGNOSIS — L439 Lichen planus, unspecified: Secondary | ICD-10-CM | POA: Diagnosis not present

## 2014-05-25 ENCOUNTER — Encounter: Payer: Self-pay | Admitting: Internal Medicine

## 2014-05-25 ENCOUNTER — Ambulatory Visit (INDEPENDENT_AMBULATORY_CARE_PROVIDER_SITE_OTHER): Payer: Medicare Other | Admitting: Internal Medicine

## 2014-05-25 VITALS — BP 120/82 | HR 72 | Temp 98.0°F | Resp 16 | Wt 227.0 lb

## 2014-05-25 DIAGNOSIS — R718 Other abnormality of red blood cells: Secondary | ICD-10-CM | POA: Insufficient documentation

## 2014-05-25 DIAGNOSIS — Z23 Encounter for immunization: Secondary | ICD-10-CM | POA: Diagnosis not present

## 2014-05-25 DIAGNOSIS — F3289 Other specified depressive episodes: Secondary | ICD-10-CM | POA: Diagnosis not present

## 2014-05-25 DIAGNOSIS — I4891 Unspecified atrial fibrillation: Secondary | ICD-10-CM | POA: Diagnosis not present

## 2014-05-25 DIAGNOSIS — F329 Major depressive disorder, single episode, unspecified: Secondary | ICD-10-CM

## 2014-05-25 DIAGNOSIS — R002 Palpitations: Secondary | ICD-10-CM | POA: Diagnosis not present

## 2014-05-25 DIAGNOSIS — I480 Paroxysmal atrial fibrillation: Secondary | ICD-10-CM

## 2014-05-25 NOTE — Progress Notes (Signed)
   Subjective:    HPI    The patient has been doing well overall without major physical or psychological issues going on lately.  The patient needs to address  chronic palpitations, alopecia  BP Readings from Last 3 Encounters:  05/25/14 120/82  03/24/14 130/78  01/19/14 128/78   Wt Readings from Last 3 Encounters:  05/25/14 227 lb (102.967 kg)  03/24/14 227 lb 9.6 oz (103.239 kg)  01/19/14 225 lb (102.059 kg)       Review of Systems  Constitutional: Negative for chills, activity change, appetite change, fatigue and unexpected weight change.  HENT: Negative for congestion, mouth sores and sinus pressure.   Eyes: Negative for visual disturbance.  Respiratory: Negative for cough and chest tightness.   Cardiovascular: Positive for palpitations. Negative for leg swelling.  Gastrointestinal: Negative for nausea and abdominal pain.  Genitourinary: Negative for dysuria, urgency, frequency, hematuria, difficulty urinating and vaginal pain.  Musculoskeletal: Negative for back pain, gait problem and myalgias.  Skin: Negative for pallor and rash.       Hair loss  Allergic/Immunologic: Positive for environmental allergies.  Neurological: Negative for dizziness, tremors, weakness, numbness and headaches.  Psychiatric/Behavioral: Negative for suicidal ideas, confusion and sleep disturbance.       Objective:   Physical Exam  Constitutional: She appears well-developed. No distress.  Obese   HENT:  Head: Normocephalic.  Right Ear: External ear normal.  Left Ear: External ear normal.  Nose: Nose normal.  Mouth/Throat: Oropharynx is clear and moist.  Eyes: Conjunctivae are normal. Pupils are equal, round, and reactive to light. Right eye exhibits no discharge. Left eye exhibits no discharge.  Neck: Normal range of motion. Neck supple. No JVD present. No tracheal deviation present. No thyromegaly present.  Cardiovascular: Normal rate, regular rhythm and normal heart sounds.   No  murmur heard. Pulmonary/Chest: No stridor. No respiratory distress. She has no wheezes. She has no rales.  Abdominal: Soft. Bowel sounds are normal. She exhibits no distension and no mass. There is no tenderness. There is no rebound and no guarding.  Musculoskeletal: She exhibits no edema and no tenderness.  Lymphadenopathy:    She has no cervical adenopathy.  Neurological: She displays normal reflexes. No cranial nerve deficit. She exhibits normal muscle tone. Coordination normal.  Skin: No rash noted. No erythema.  alopecia  Psychiatric: She has a normal mood and affect. Her behavior is normal. Judgment and thought content normal.   Lab Results  Component Value Date   WBC 6.3 11/10/2013   HGB 13.7 11/10/2013   HCT 40.4 11/10/2013   PLT 189.0 11/10/2013   GLUCOSE 99 11/10/2013   CHOL 188 06/11/2013   TRIG 71 06/11/2013   HDL 81 06/11/2013   LDLCALC 93 06/11/2013   ALT 35 06/11/2013   AST 31 06/11/2013   NA 136 11/10/2013   K 5.3* 11/10/2013   CL 102 11/10/2013   CREATININE 1.0 11/10/2013   BUN 12 11/10/2013   CO2 27 11/10/2013   TSH 2.630 01/19/2014   INR 0.9 09/21/2008   HGBA1C 5.5 02/20/2008           Assessment & Plan:

## 2014-05-25 NOTE — Progress Notes (Signed)
Pre visit review using our clinic review tool, if applicable. No additional management support is needed unless otherwise documented below in the visit note. 

## 2014-05-25 NOTE — Assessment & Plan Note (Signed)
2015  [facility] 102 at [facility] ?wine related Labs

## 2014-05-25 NOTE — Assessment & Plan Note (Signed)
Continue with current prescription therapy as reflected on the Med list.  

## 2014-05-25 NOTE — Assessment & Plan Note (Signed)
No relapse 

## 2014-05-27 ENCOUNTER — Other Ambulatory Visit: Payer: Self-pay | Admitting: Internal Medicine

## 2014-05-27 DIAGNOSIS — R002 Palpitations: Secondary | ICD-10-CM | POA: Diagnosis not present

## 2014-05-27 DIAGNOSIS — I4891 Unspecified atrial fibrillation: Secondary | ICD-10-CM | POA: Diagnosis not present

## 2014-05-27 DIAGNOSIS — N038 Chronic nephritic syndrome with other morphologic changes: Secondary | ICD-10-CM | POA: Diagnosis not present

## 2014-05-27 LAB — CBC
HCT: 39.3 % (ref 36.0–46.0)
Hemoglobin: 13.6 g/dL (ref 12.0–15.0)
MCH: 34.2 pg — ABNORMAL HIGH (ref 26.0–34.0)
MCHC: 34.6 g/dL (ref 30.0–36.0)
MCV: 98.7 fL (ref 78.0–100.0)
PLATELETS: 214 10*3/uL (ref 150–400)
RBC: 3.98 MIL/uL (ref 3.87–5.11)
RDW: 13.7 % (ref 11.5–15.5)
WBC: 6.2 10*3/uL (ref 4.0–10.5)

## 2014-05-28 LAB — URINALYSIS, ROUTINE W REFLEX MICROSCOPIC
BILIRUBIN URINE: NEGATIVE
Glucose, UA: NEGATIVE mg/dL
HGB URINE DIPSTICK: NEGATIVE
Ketones, ur: NEGATIVE mg/dL
Leukocytes, UA: NEGATIVE
Nitrite: NEGATIVE
PH: 6.5 (ref 5.0–8.0)
PROTEIN: NEGATIVE mg/dL
Specific Gravity, Urine: 1.016 (ref 1.005–1.030)
Urobilinogen, UA: 0.2 mg/dL (ref 0.0–1.0)

## 2014-05-28 LAB — COMPREHENSIVE METABOLIC PANEL
ALBUMIN: 4.2 g/dL (ref 3.5–5.2)
ALT: 30 U/L (ref 0–35)
AST: 23 U/L (ref 0–37)
Alkaline Phosphatase: 67 U/L (ref 39–117)
BUN: 13 mg/dL (ref 6–23)
CO2: 20 mEq/L (ref 19–32)
Calcium: 9.2 mg/dL (ref 8.4–10.5)
Chloride: 96 mEq/L (ref 96–112)
Creat: 0.68 mg/dL (ref 0.50–1.10)
Glucose, Bld: 92 mg/dL (ref 70–99)
POTASSIUM: 4.6 meq/L (ref 3.5–5.3)
Sodium: 127 mEq/L — ABNORMAL LOW (ref 135–145)
Total Bilirubin: 0.7 mg/dL (ref 0.2–1.2)
Total Protein: 6.7 g/dL (ref 6.0–8.3)

## 2014-05-28 LAB — VITAMIN B12: Vitamin B-12: 318 pg/mL (ref 211–911)

## 2014-05-28 LAB — SEDIMENTATION RATE: Sed Rate: 11 mm/h (ref 0–22)

## 2014-05-28 LAB — HEMOGLOBIN A1C
HEMOGLOBIN A1C: 5.4 % (ref <5.7)
Mean Plasma Glucose: 108 mg/dL (ref <117)

## 2014-05-28 LAB — TSH: TSH: 2.368 u[IU]/mL (ref 0.350–4.500)

## 2014-05-31 LAB — SPEP & IFE WITH QIG
ALPHA-2-GLOBULIN: 11.2 % (ref 7.1–11.8)
Albumin ELP: 57 % (ref 55.8–66.1)
Alpha-1-Globulin: 4.7 % (ref 2.9–4.9)
BETA 2: 6 % (ref 3.2–6.5)
Beta Globulin: 6.9 % (ref 4.7–7.2)
GAMMA GLOBULIN: 14.2 % (ref 11.1–18.8)
IGG (IMMUNOGLOBIN G), SERUM: 1030 mg/dL (ref 690–1700)
IgA: 208 mg/dL (ref 69–380)
IgM, Serum: 68 mg/dL (ref 52–322)
Total Protein, Serum Electrophoresis: 6.7 g/dL (ref 6.0–8.3)

## 2014-06-15 DIAGNOSIS — F4323 Adjustment disorder with mixed anxiety and depressed mood: Secondary | ICD-10-CM | POA: Diagnosis not present

## 2014-07-02 ENCOUNTER — Other Ambulatory Visit: Payer: Self-pay | Admitting: Internal Medicine

## 2014-07-05 ENCOUNTER — Ambulatory Visit (INDEPENDENT_AMBULATORY_CARE_PROVIDER_SITE_OTHER): Payer: Medicare Other | Admitting: Sports Medicine

## 2014-07-05 ENCOUNTER — Ambulatory Visit
Admission: RE | Admit: 2014-07-05 | Discharge: 2014-07-05 | Disposition: A | Discharge status: Home / Self Care | Payer: Medicare Other | Source: Ambulatory Visit | Attending: Sports Medicine | Admitting: Sports Medicine

## 2014-07-05 ENCOUNTER — Encounter: Payer: Self-pay | Admitting: Sports Medicine

## 2014-07-05 VITALS — BP 137/61 | Ht 66.0 in | Wt 227.0 lb

## 2014-07-05 DIAGNOSIS — M25562 Pain in left knee: Principal | ICD-10-CM

## 2014-07-05 DIAGNOSIS — IMO0002 Reserved for concepts with insufficient information to code with codable children: Secondary | ICD-10-CM | POA: Diagnosis not present

## 2014-07-05 DIAGNOSIS — M25561 Pain in right knee: Secondary | ICD-10-CM

## 2014-07-05 DIAGNOSIS — M1710 Unilateral primary osteoarthritis, unspecified knee: Secondary | ICD-10-CM | POA: Diagnosis not present

## 2014-07-05 DIAGNOSIS — M25569 Pain in unspecified knee: Secondary | ICD-10-CM | POA: Diagnosis not present

## 2014-07-05 NOTE — Progress Notes (Signed)
   Subjective:    Patient ID: Amanda Wilkins, female    DOB: 1946-03-22, 68 y.o.   MRN: [id number]  HPI chief complaint: Left knee pain  Very pleasant 68 year old female comes in today complaining of 3 months of worsening left knee pain. No trauma that she can recall. She has a history of knee osteoarthritis. In fact, she had an MRI scan 3 years ago that showed a medial meniscal tear as well as arthritis. She was most recently seen at [place] by Dr. Fredonia Highland. X-rays were done but they are not available for review. Those x-rays were a few months ago. She describes a burning type discomfort that she localizes to the medial knee. Worse at the end of the day. She's not noticed any swelling but does get some tightness in the knee. She has also been told previously that she has a Baker's cyst. No mechanical symptoms. No feelings of instability. She has been doing some limited physical therapy.she has also tried dry needling. Her pain is keeping her from being active. She has not taken any medication for this. Denies prior knee surgeries.    past medical history and current medications are reviewed. Patient has a history of atrial fibrillation. She takes 325 mg of aspirin daily.   medications reviewed Allergies reviewed    Review of Systems    as above Objective:   Physical Exam  obese. No acute distress.   left knee: Range of motion 0-110. She is tender to palpation along the medial joint line. Negative McMurray's. Negative Thessaly's. Good stability. There is some fullness in the popliteal fossa consistent with a probable Baker's cyst here. No tenderness to palpation along the lateral joint line. No tenderness at the pes anserine bursa. Neurovascularly intact distally.       A/P  I'm going to start with some updated x-rays of her knees.   Left knee pain secondary to DJD with concomitant medial meniscal tear  I'm going to start with some updated x-rays of her knees.  She is having some mild discomfort in the right knee as well. We will try a body helix compression sleeve. Given her history of A- fib and chronic aspirin use I want to avoid oral anti-inflammatories. Therefore I will have her try extra strength Tylenol one to 2 pills every 6-8 hours as needed for pain. I have instructed her in 2 basic quadriceps exercises and 1 hamstring exercise. She will do these daily. She will utilize ice daily as well. Followup with me in 3 weeks. We will plan on reviewing her x-rays at that time. I discussed the possibility of a cortisone injection if symptoms persist. She was a little hesitant about doing that today but she understands that we may want to reconsider that if she continues to have pain.

## 2014-07-06 ENCOUNTER — Telehealth: Payer: Self-pay | Admitting: Sports Medicine

## 2014-07-06 NOTE — Telephone Encounter (Signed)
I spoke with the patient on the phone today after reviewing x-rays of her knees. She has mild medial compartmental OA on the left and mild to moderate medial compartmental OA on the right. If her symptoms persist despite trying Tylenol and a compression sleeve I would recommend a cortisone injection. She has a followup appointment in 3 weeks but I've encouraged her to followup sooner if her pain is severe enough to warrant trying a cortisone injection.

## 2014-07-12 ENCOUNTER — Ambulatory Visit (INDEPENDENT_AMBULATORY_CARE_PROVIDER_SITE_OTHER): Payer: Medicare Other | Admitting: Sports Medicine

## 2014-07-12 ENCOUNTER — Encounter: Payer: Self-pay | Admitting: Sports Medicine

## 2014-07-12 VITALS — BP 127/76 | Ht 66.0 in | Wt 227.0 lb

## 2014-07-12 DIAGNOSIS — M170 Bilateral primary osteoarthritis of knee: Secondary | ICD-10-CM

## 2014-07-12 DIAGNOSIS — M1710 Unilateral primary osteoarthritis, unspecified knee: Secondary | ICD-10-CM

## 2014-07-12 MED ORDER — METHYLPREDNISOLONE ACETATE 40 MG/ML IJ SUSP
40.0000 mg | Freq: Once | INTRAMUSCULAR | Status: AC
  Administered 2014-07-12: 40 mg via INTRA_ARTICULAR

## 2014-07-12 NOTE — Progress Notes (Signed)
   Subjective:    Patient ID: Amanda Wilkins, female    DOB: 01/04/1946, 68 y.o.   MRN: [id number]  HPI Patient comes in today with persistent left knee pain. She's describing medial sided knee pain worse with activity. Some fullness in the popliteal fossa as well. Recent x-ray showed some mild/moderate medial compartmental narrowing. She has been using Tylenol and compression but despite this her symptoms persist. Her pain is intermittent. No mechanical symptoms.    Review of Systems     Objective:   Physical Exam Overweight. No acute distress.  Left knee: Range of motion 0-120. There is some fullness in the popliteal fossa consistent with a probable Baker's cyst. There is tenderness to palpation along the medial joint line with pain but no popping with McMurray's. No tenderness along the lateral joint line. Knee is stable to ligamentous exam. Neurovascular intact distally. Walking with a slight limp.  X-rays are as above       Assessment & Plan:  Left knee pain secondary to medial compartmental DJD with probable degenerative medial meniscal tear and concomitant Baker's cyst  I recommended a cortisone injection today. Patient is allergic to epinephrine. I elected to inject the knee with 1 cc of Depo-Medrol only. No Xylocaine and no epinephrine. Patient tolerated this without difficulty. If symptoms persist, I would consider an updated MRI of her left knee. An MRI 3 years ago showed a small medial meniscal tear. If that tear has progressed and symptoms persist we may want to consider surgical consultation. She will followup with me as scheduled in 2-3 weeks.  Consent obtained and verified. Time-out conducted. Noted no overlying erythema, induration, or other signs of local infection. Skin prepped in a sterile fashion. Topical analgesic spray: Ethyl chloride. Joint: left knee Needle: 22g 1.5 inch Completed without difficulty. Meds: 1cc (40mg ) depomedrol. NO XYLOCAINE and NO  EPINEPHRINE  Advised to call if fevers/chills, erythema, induration, drainage, or persistent bleeding.

## 2014-07-14 ENCOUNTER — Telehealth: Payer: Self-pay | Admitting: *Deleted

## 2014-07-14 NOTE — Progress Notes (Signed)
Amanda Wilkins Date of Birth: 22-Feb-1946 Medical Record #[id number]  History of Present Illness: Amanda Wilkins is a 68 yo with history of CP,  obesity and PAF.   I saw her back in Aug 2014.  She was seen by Tommas Olp in Jan 2015.  Had echo done which was normal.  Did not make appt for myoview.   Since seen she has done well from a cardiac standpoint  Denies palpitations.  No CP  No SOB Biggest problem is knees    Current Outpatient Prescriptions  Medication Sig Dispense Refill  . aspirin EC 325 MG tablet Take 325 mg by mouth daily.      [name] atenolol (TENORMIN) 50 MG tablet TAKE 1 TABLET BY MOUTH DAILY  90 tablet  3  . cholecalciferol (VITAMIN D) 1000 UNITS tablet Take 2,000 Units by mouth daily.       [name] diltiazem (DILACOR XR) 180 MG 24 hr capsule Take 1 capsule (180 mg total) by mouth daily.  90 capsule  3  . estrogens, conjugated, (PREMARIN) 0.3 MG tablet Take daily  30 tablet  11  . fluocinonide cream (LIDEX) 4.09 % Apply 1 application topically 2 (two) times daily.      [name] glycopyrrolate (ROBINUL) 1 MG tablet Take 1 mg by mouth 2 (two) times daily.      . hydroxychloroquine (PLAQUENIL) 200 MG tablet Take 200 mg by mouth 2 (two) times daily.      . medroxyPROGESTERone (PROVERA) 10 MG tablet TAKE ONE TABLET 10 DAYS OF THE MONTH  30 tablet  4  . METROGEL 1 % gel Apply 1 application topically daily as needed (for rosacea).       . sertraline (ZOLOFT) 50 MG tablet Take 1 tablet (50 mg total) by mouth daily.  90 tablet  3  . tacrolimus (PROTOPIC) 0.1 % ointment Apply topically 2 (two) times daily.       No current facility-administered medications for this visit.    Allergies  Allergen Reactions  . Epinephrine Anaphylaxis  . Aspirin     Can take the coated 325 mg. Plain asa 325 mg speeds up the heart.  [name] Epinephrine Base   . Penicillins     She took amoxicillin in 2012-2013 and was ok    Past Medical History  Diagnosis Date  . Allergy   . Atrial fibrillation   . Migraines       on Zoloft for migraines  . Hypertension   . Chronic interstitial nephritis   . Depression   . Palpitations   . Obesity   . Arthritis     ON BOTH KNEES    Past Surgical History  Procedure Laterality Date  . Cesarean section    . Dental surgery      multiple implants    History  Smoking status  . Never Smoker   Smokeless tobacco  . Never Used    Comment: H/O social smoking at times when drinking--never a "habit"    History  Alcohol Use  . 8.4 oz/week  . 14 Glasses of wine per week    Comment: WINE- TWO GLASSES AT [employer]    Family History  Problem Relation Age of Onset  . Cancer Brother     PANCREATIC    Review of Systems: The review of systems is per the HPI.  All other systems were reviewed and are negative.  Physical Exam: BP 120/70  Pulse 76  Ht 5\' 6"  (1.676 m)  Wt 227 lb (  102.967 kg)  BMI 36.66 kg/m2 Patient is very pleasant and in no acute distress. She is morbidly obese.   HEENT is unremarkable. Normocephalic/atraumatic. PERRL. Sclera are nonicteric. Neck is supple. No JVD. Lungs are clear. Cardiac exam shows a regular rate and rhythm.   Abdomen is obese but soft. Extremities are without edema. Gait and ROM are intact. No gross neurologic deficits noted.  Wt Readings from Last 3 Encounters:  07/15/14 227 lb (102.967 kg)  07/12/14 227 lb (102.967 kg)  07/05/14 227 lb (102.967 kg)     LABORATORY DATA: EKG today shows sinus rhythm.    Lab Results  Component Value Date   WBC 6.2 05/27/2014   HGB 13.6 05/27/2014   HCT 39.3 05/27/2014   PLT 214 05/27/2014   GLUCOSE 92 05/27/2014   CHOL 188 06/11/2013   TRIG 71 06/11/2013   HDL 81 06/11/2013   LDLCALC 93 06/11/2013   ALT 30 05/27/2014   AST 23 05/27/2014   NA 127* 05/27/2014   K 4.6 05/27/2014   CL 96 05/27/2014   CREATININE 0.68 05/27/2014   BUN 13 05/27/2014   CO2 20 05/27/2014   TSH 2.368 05/27/2014   INR 0.9 09/21/2008   HGBA1C 5.4 05/27/2014   ECHO SUMMARY - Overall left ventricular systolic  function was normal. Left ventricular ejection fraction was estimated , range being 60 % to 70 %. - Mild MR directed posteriorly in LA.  EKG  SR 76 bpm   Assessment / Plan: 1. Atypical chest pain - Denies  2. PAF - in sinus today  Denies palpitations.   .  3.  Hyponatremia.  WIll repeat BMET  4.  Morbid obesity  Discussed diet, wt loss.    Otherwise f/u 1 year

## 2014-07-14 NOTE — Telephone Encounter (Signed)
Birth recurrent Premarin tablet and a half and continue to take daily with the addition of the Provera for 10 mg monthly for the next 6 month then stop all together

## 2014-07-14 NOTE — Telephone Encounter (Signed)
Pt currently taking HRT premarin 0.3 mg tablets and provera 10 mg day 1-10 of the month would like to start wean off medication. She asked how to do this? Please advise

## 2014-07-15 ENCOUNTER — Ambulatory Visit (INDEPENDENT_AMBULATORY_CARE_PROVIDER_SITE_OTHER): Payer: Medicare Other | Admitting: Internal Medicine

## 2014-07-15 ENCOUNTER — Encounter: Payer: Self-pay | Admitting: Internal Medicine

## 2014-07-15 VITALS — BP 120/70 | HR 76 | Ht 66.0 in | Wt 227.0 lb

## 2014-07-15 DIAGNOSIS — I4891 Unspecified atrial fibrillation: Secondary | ICD-10-CM

## 2014-07-15 NOTE — Telephone Encounter (Signed)
Pt informed with the below note. 

## 2014-07-15 NOTE — Patient Instructions (Signed)
Your physician recommends that you continue on your current medications as directed. Please refer to the Current Medication list given to you today. Your physician recommends that you return for lab work TODAY. (bmet) Your physician wants you to follow-up in: [place].  You will receive a reminder letter in the mail two months in advance. If you don't receive a letter, please call our office to schedule the follow-up appointment.

## 2014-07-16 LAB — BASIC METABOLIC PANEL
BUN: 21 mg/dL (ref 6–23)
CALCIUM: 9.2 mg/dL (ref 8.4–10.5)
CO2: 24 mEq/L (ref 19–32)
CREATININE: 0.9 mg/dL (ref 0.4–1.2)
Chloride: 98 mEq/L (ref 96–112)
GFR: 63.8 mL/min (ref >60.00)
GLUCOSE: 85 mg/dL (ref 70–99)
Potassium: 4.1 mEq/L (ref 3.5–5.1)
SODIUM: 132 meq/L — AB (ref 135–145)

## 2014-07-19 ENCOUNTER — Other Ambulatory Visit: Payer: Self-pay | Admitting: *Deleted

## 2014-07-19 DIAGNOSIS — M25562 Pain in left knee: Secondary | ICD-10-CM

## 2014-07-20 ENCOUNTER — Ambulatory Visit: Payer: Medicare Other

## 2014-07-22 ENCOUNTER — Telehealth: Payer: Self-pay | Admitting: Family

## 2014-07-22 NOTE — Telephone Encounter (Signed)
Patient want to make sure he has the right instructions, that he can walk at anytime for appt. Please advise

## 2014-07-22 NOTE — Telephone Encounter (Signed)
Yes, he can come in anytime for the x-ray. The order is there.

## 2014-07-22 NOTE — Telephone Encounter (Signed)
Patient routed to J. Lucciana Head by mistake, please route to appropriate person.  Thank you.

## 2014-07-23 NOTE — Telephone Encounter (Signed)
Pt was advised that he can come in anytime for an xray and that the order was already there.

## 2014-07-25 ENCOUNTER — Other Ambulatory Visit: Payer: Medicare Other

## 2014-07-26 ENCOUNTER — Ambulatory Visit
Admission: RE | Admit: 2014-07-26 | Discharge: 2014-07-26 | Disposition: A | Discharge status: Home / Self Care | Payer: Medicare Other | Source: Ambulatory Visit | Attending: Sports Medicine | Admitting: Sports Medicine

## 2014-07-26 ENCOUNTER — Ambulatory Visit (INDEPENDENT_AMBULATORY_CARE_PROVIDER_SITE_OTHER): Payer: Medicare Other | Admitting: Sports Medicine

## 2014-07-26 ENCOUNTER — Encounter: Payer: Self-pay | Admitting: Sports Medicine

## 2014-07-26 VITALS — BP 139/83 | HR 74 | Ht 66.0 in | Wt 227.0 lb

## 2014-07-26 DIAGNOSIS — M23322 Other meniscus derangements, posterior horn of medial meniscus, left knee: Secondary | ICD-10-CM | POA: Diagnosis not present

## 2014-07-26 DIAGNOSIS — S83242D Other tear of medial meniscus, current injury, left knee, subsequent encounter: Secondary | ICD-10-CM | POA: Diagnosis not present

## 2014-07-26 DIAGNOSIS — M1712 Unilateral primary osteoarthritis, left knee: Secondary | ICD-10-CM | POA: Diagnosis not present

## 2014-07-26 DIAGNOSIS — M25562 Pain in left knee: Secondary | ICD-10-CM

## 2014-07-26 NOTE — Progress Notes (Signed)
Patient ID: Amanda Wilkins, female   DOB: [birth year], 68 y.o.   MRN: [id number]  Patient comes in today to discuss MRI findings of her left knee. MRI shows a large radial tear through the posterior horn and body of the medial meniscus. She also has patellofemoral DJD but only moderate medial compartmental osteoarthritis. She continues to experience pain and mechanical symptoms. She has failed conservative treatment to date including a cortisone injection recently. Therefore, I've recommended a referral to [place]  at [facility] orthopedics to discuss merits of arthroscopy. She has seen him in the past for a similar issue. I will defer definitive treatment for her ongoing left knee predicament to his discretion. Followup with me when necessary.

## 2014-07-27 ENCOUNTER — Ambulatory Visit: Payer: Medicare Other

## 2014-07-27 DIAGNOSIS — Z23 Encounter for immunization: Secondary | ICD-10-CM

## 2014-08-02 DIAGNOSIS — L658 Other specified nonscarring hair loss: Secondary | ICD-10-CM | POA: Diagnosis not present

## 2014-08-02 DIAGNOSIS — L661 Lichen planopilaris: Secondary | ICD-10-CM | POA: Diagnosis not present

## 2014-08-02 DIAGNOSIS — L669 Cicatricial alopecia, unspecified: Secondary | ICD-10-CM | POA: Diagnosis not present

## 2014-08-04 ENCOUNTER — Other Ambulatory Visit: Payer: Self-pay | Admitting: Internal Medicine

## 2014-08-07 DIAGNOSIS — M23332 Other meniscus derangements, other medial meniscus, left knee: Secondary | ICD-10-CM | POA: Diagnosis not present

## 2014-08-16 ENCOUNTER — Encounter: Payer: Self-pay | Admitting: Sports Medicine

## 2014-08-17 DIAGNOSIS — S83242A Other tear of medial meniscus, current injury, left knee, initial encounter: Secondary | ICD-10-CM | POA: Diagnosis not present

## 2014-08-17 DIAGNOSIS — M1712 Unilateral primary osteoarthritis, left knee: Secondary | ICD-10-CM | POA: Diagnosis not present

## 2014-08-17 DIAGNOSIS — M25562 Pain in left knee: Secondary | ICD-10-CM | POA: Diagnosis not present

## 2014-08-18 ENCOUNTER — Telehealth: Payer: Self-pay | Admitting: Internal Medicine

## 2014-08-18 NOTE — Telephone Encounter (Signed)
New Message        Pt calling stating she is having surgery for a torn maniscus on 08/25/14. Dr. Leona Carry is wanting pt to be off Ecotrin 325mg  for 3-5 prior to surgery. Pt is wanting to know if Dr. [name] thinks that is ok. Please call pt back to advise.

## 2014-08-18 NOTE — Telephone Encounter (Signed)
Pt is having surgery for a torn meniscus on 08/25/14. Her surgeon Dr. Leona Carry wants for pt to be off Ecotrin 325 mg for 3 to 5 days prior surgery. Pt takes this medication one pill daily. Pt is aware that this message will be send to MD for recommendations.

## 2014-08-19 NOTE — Telephone Encounter (Signed)
Informed patient, and informed that Dr. [name] forwarded to Dr. Lorre Nick. She is aware that if Dr. Lorre Nick wants her to stop aspirin for 5 days that would mean holding it starting tomorrow. Advised patient to contact Dr. Jeoffrey Massed office tomorrow to discuss further. Pt verbalizes understanding and is appreciative of the phone call.

## 2014-08-19 NOTE — Telephone Encounter (Signed)
OK to stop aspirin 3 to 5 days before surgery.

## 2014-08-24 DIAGNOSIS — L661 Lichen planopilaris: Secondary | ICD-10-CM | POA: Diagnosis not present

## 2014-08-25 DIAGNOSIS — M6752 Plica syndrome, left knee: Secondary | ICD-10-CM | POA: Diagnosis not present

## 2014-08-25 DIAGNOSIS — M94262 Chondromalacia, left knee: Secondary | ICD-10-CM | POA: Diagnosis not present

## 2014-08-25 DIAGNOSIS — S83232A Complex tear of medial meniscus, current injury, left knee, initial encounter: Secondary | ICD-10-CM | POA: Diagnosis not present

## 2014-08-25 DIAGNOSIS — M2242 Chondromalacia patellae, left knee: Secondary | ICD-10-CM | POA: Diagnosis not present

## 2014-08-25 DIAGNOSIS — X58XXXA Exposure to other specified factors, initial encounter: Secondary | ICD-10-CM | POA: Diagnosis not present

## 2014-08-31 DIAGNOSIS — R42 Dizziness and giddiness: Secondary | ICD-10-CM | POA: Diagnosis not present

## 2014-09-01 DIAGNOSIS — Z79899 Other long term (current) drug therapy: Secondary | ICD-10-CM | POA: Diagnosis not present

## 2014-09-15 DIAGNOSIS — L218 Other seborrheic dermatitis: Secondary | ICD-10-CM | POA: Diagnosis not present

## 2014-09-20 ENCOUNTER — Telehealth: Payer: Self-pay | Admitting: Internal Medicine

## 2014-09-20 NOTE — Telephone Encounter (Signed)
New message         Pt would like to know if she can take celebrex

## 2014-09-20 NOTE — Telephone Encounter (Signed)
Patient is OK (low risk) to take celebrex

## 2014-09-22 ENCOUNTER — Emergency Department (HOSPITAL_COMMUNITY): Payer: Medicare Other

## 2014-09-22 ENCOUNTER — Encounter (HOSPITAL_COMMUNITY): Payer: Self-pay

## 2014-09-22 ENCOUNTER — Telehealth: Payer: Self-pay | Admitting: Internal Medicine

## 2014-09-22 ENCOUNTER — Emergency Department (HOSPITAL_COMMUNITY)
Admission: EM | Admit: 2014-09-22 | Discharge: 2014-09-22 | Disposition: A | Discharge status: Home / Self Care | Payer: Medicare Other | Attending: Emergency Medicine | Admitting: Emergency Medicine

## 2014-09-22 DIAGNOSIS — G43909 Migraine, unspecified, not intractable, without status migrainosus: Secondary | ICD-10-CM | POA: Insufficient documentation

## 2014-09-22 DIAGNOSIS — Z87448 Personal history of other diseases of urinary system: Secondary | ICD-10-CM | POA: Diagnosis not present

## 2014-09-22 DIAGNOSIS — M179 Osteoarthritis of knee, unspecified: Secondary | ICD-10-CM | POA: Insufficient documentation

## 2014-09-22 DIAGNOSIS — F329 Major depressive disorder, single episode, unspecified: Secondary | ICD-10-CM | POA: Diagnosis not present

## 2014-09-22 DIAGNOSIS — J42 Unspecified chronic bronchitis: Secondary | ICD-10-CM | POA: Diagnosis not present

## 2014-09-22 DIAGNOSIS — Z880 Allergy status to penicillin: Secondary | ICD-10-CM | POA: Diagnosis not present

## 2014-09-22 DIAGNOSIS — E669 Obesity, unspecified: Secondary | ICD-10-CM | POA: Diagnosis not present

## 2014-09-22 DIAGNOSIS — Z79899 Other long term (current) drug therapy: Secondary | ICD-10-CM | POA: Diagnosis not present

## 2014-09-22 DIAGNOSIS — Z7952 Long term (current) use of systemic steroids: Secondary | ICD-10-CM | POA: Diagnosis not present

## 2014-09-22 DIAGNOSIS — J9811 Atelectasis: Secondary | ICD-10-CM | POA: Diagnosis not present

## 2014-09-22 DIAGNOSIS — Z7982 Long term (current) use of aspirin: Secondary | ICD-10-CM | POA: Diagnosis not present

## 2014-09-22 DIAGNOSIS — I10 Essential (primary) hypertension: Secondary | ICD-10-CM | POA: Insufficient documentation

## 2014-09-22 DIAGNOSIS — R0602 Shortness of breath: Secondary | ICD-10-CM

## 2014-09-22 LAB — CBC
HCT: 39.1 % (ref 36.0–46.0)
Hemoglobin: 13.4 g/dL (ref 12.0–15.0)
MCH: 33.8 pg (ref 26.0–34.0)
MCHC: 34.3 g/dL (ref 30.0–36.0)
MCV: 98.7 fL (ref 78.0–100.0)
Platelets: 184 10*3/uL (ref 150–400)
RBC: 3.96 MIL/uL (ref 3.87–5.11)
RDW: 13.2 % (ref 11.5–15.5)
WBC: 6.1 10*3/uL (ref 4.0–10.5)

## 2014-09-22 LAB — BASIC METABOLIC PANEL
Anion gap: 16 — ABNORMAL HIGH (ref 5–15)
BUN: 17 mg/dL (ref 6–23)
CO2: 18 mEq/L — ABNORMAL LOW (ref 19–32)
Calcium: 9.4 mg/dL (ref 8.4–10.5)
Chloride: 104 mEq/L (ref 96–112)
Creatinine, Ser: 0.79 mg/dL (ref 0.50–1.10)
GFR calc Af Amer: >90 mL/min (ref >90)
GFR, EST NON AFRICAN AMERICAN: 84 mL/min — AB (ref >90)
Glucose, Bld: 96 mg/dL (ref 70–99)
Potassium: 4.2 mEq/L (ref 3.7–5.3)
Sodium: 138 mEq/L (ref 137–147)

## 2014-09-22 LAB — I-STAT TROPONIN, ED: Troponin i, poc: 0 ng/mL (ref 0.00–0.08)

## 2014-09-22 LAB — PRO B NATRIURETIC PEPTIDE: Pro B Natriuretic peptide (BNP): 479.7 pg/mL — ABNORMAL HIGH (ref 0–125)

## 2014-09-22 MED ORDER — IOHEXOL 350 MG/ML SOLN
100.0000 mL | Freq: Once | INTRAVENOUS | Status: AC | PRN
  Administered 2014-09-22: 100 mL via INTRAVENOUS

## 2014-09-22 NOTE — ED Notes (Signed)
Patient transported to CT 

## 2014-09-22 NOTE — Telephone Encounter (Signed)
Informed patient ok per Dr. [name] to take celebrex.  Pt reports new onset SOB started yesterday. Feels like she cant get a deep breath and also like she has been running and is out of breath after just normal walking. She related it to starting to use ibuprofen 800 mg.  She had a knee surgery 3 weeks ago for a meniscus tear. Discussed with Ignacia Bayley, NP.  (flex DOD) Advised pt to go to ED to evaluate for possible blood clot. Pt verbalizes understanding and agreement.

## 2014-09-22 NOTE — ED Notes (Signed)
Pt. Had lt. Knee surgery 1 week before Thanksgiving and developed sob yesterday.  She also reports having periods of feeling her heart flutter.  She denies any chest pain.  She denies any n/v.  She does have lt. Knee pain 4/10.   Skin is p/w/d.  Pt. Is in NAD>  ECG completed in Triage.

## 2014-09-22 NOTE — Telephone Encounter (Signed)
F/U  Pt calling, states she would like to know if she can take Celebrex, pt also has been taking pain meds. Please advise pt. [phone].

## 2014-09-22 NOTE — Telephone Encounter (Signed)
Error

## 2014-09-22 NOTE — ED Notes (Signed)
Pt had left knee surgery three weeks ago. Has noticed shortness of breathe with talking and at rest since yesterday. Pt is not on blood thinner and does have history of afib.

## 2014-09-22 NOTE — ED Notes (Signed)
PT returned from bathroom. Monitored by pulse ox, bp cuff, and 5-lead. 

## 2014-09-22 NOTE — ED Notes (Signed)
Pt ambulated to bathroom. Denies dizziness or light headedness. Steady gait.

## 2014-09-22 NOTE — Discharge Instructions (Signed)
Your work-up was negative for blood clots or infection in your lungs. Follow-up with your primary care physician and/or cardiologist for re-check. Return to the ED for new or worsening symptoms.

## 2014-09-22 NOTE — ED Provider Notes (Signed)
CSN: [id number]     Arrival date & time 09/22/14  1718 History   First MD Initiated Contact with Patient 09/22/14 1844     Chief Complaint  Patient presents with  . Shortness of Breath     (Consider location/radiation/quality/duration/timing/severity/associated sxs/prior Treatment) The history is provided by the patient and medical records.    This is a 68 y.o. F with PMH significant for paroxysmal AFIB, migraine headaches, depression, HTN, palpitations, arthritis, presenting to the ED for SOB.  Patient states symptoms began yesterday, worse with long period of talking, exertional activities, and bending over.  She denies any associated chest pain.  Patient had knee surgery 3 weeks ago on her left knee by Dr. Lorre Nick for meniscal repair.  Surgery went well without noted complications per patient. States she called cardiology office this morning and spoke with nurse about her shortness of breath, she was encouraged to come to the ED for further evaluation with concern for PE. Patient has no prior history of DVT or PE.  She denies any lower extremity edema or calf pain. Patient is on daily aspirin, no other anticoagulation.  No reported cough, fever, sweats, chills.  Patient did recently start taking anti-inflammatories for her knee pain, unsure if this is contributing to her symptoms.  Patient followed by cardiology, Dr. [name].  VS stable on arrival.  Past Medical History  Diagnosis Date  . Allergy   . Atrial fibrillation   . Migraines     on Zoloft for migraines  . Hypertension   . Chronic interstitial nephritis   . Depression   . Palpitations   . Obesity   . Arthritis     ON BOTH KNEES   Past Surgical History  Procedure Laterality Date  . Cesarean section    . Dental surgery      multiple implants   Family History  Problem Relation Age of Onset  . Cancer Brother     PANCREATIC   History  Substance Use Topics  . Smoking status: Never Smoker   . Smokeless tobacco: Never Used      Comment: H/O social smoking at times when drinking--never a "habit"  . Alcohol Use: 8.4 oz/week    14 Glasses of wine per week     Comment: WINE- TWO GLASSES AT DINNER   OB History    Gravida Para Term Preterm AB TAB SAB Ectopic Multiple Living   2 2 2       2      Review of Systems  Respiratory: Positive for shortness of breath.   All other systems reviewed and are negative.     Allergies  Epinephrine; Aspirin; Epinephrine base; and Penicillins  Home Medications   Prior to Admission medications   Medication Sig Start Date End Date Taking? Authorizing Provider  aspirin EC 325 MG tablet Take 325 mg by mouth daily.    Historical Provider, MD  atenolol (TENORMIN) 50 MG tablet TAKE 1 TABLET BY MOUTH DAILY 03/22/14   Lew Dawes V, MD  cholecalciferol (VITAMIN D) 1000 UNITS tablet Take 2,000 Units by mouth daily.     Historical Provider, MD  DILT-XR 180 MG 24 hr capsule TAKE 1 CAPSULE BY MOUTH DAILY 08/05/14   Lew Dawes V, MD  estrogens, conjugated, (PREMARIN) 0.3 MG tablet Take daily 01/19/14   Terrance Mass, MD  fluocinonide cream (LIDEX) 7.03 % Apply 1 application topically 2 (two) times daily.    Historical Provider, MD  glycopyrrolate (ROBINUL) 1 MG tablet Take 1  mg by mouth 2 (two) times daily.    Historical Provider, MD  hydroxychloroquine (PLAQUENIL) 200 MG tablet Take 200 mg by mouth 2 (two) times daily.    Historical Provider, MD  medroxyPROGESTERone (PROVERA) 10 MG tablet TAKE ONE TABLET 10 DAYS OF THE MONTH 01/19/14   Terrance Mass, MD  METROGEL 1 % gel Apply 1 application topically daily as needed (for rosacea).  01/05/11   Historical Provider, MD  sertraline (ZOLOFT) 50 MG tablet Take 1 tablet (50 mg total) by mouth daily. 01/19/14   Terrance Mass, MD  tacrolimus (PROTOPIC) 0.1 % ointment Apply topically 2 (two) times daily.    Historical Provider, MD   BP 144/70 mmHg  Pulse 88  Temp(Src) 98.5 F (36.9 C)  Resp 18  SpO2 100%   Physical Exam   Constitutional: She is oriented to person, place, and time. She appears well-developed and well-nourished. No distress.  HENT:  Head: Normocephalic and atraumatic.  Mouth/Throat: Oropharynx is clear and moist.  Eyes: Conjunctivae and EOM are normal. Pupils are equal, round, and reactive to light.  Neck: Normal range of motion. Neck supple.  Cardiovascular: Normal rate, regular rhythm and normal heart sounds.   Pulmonary/Chest: Effort normal and breath sounds normal. No respiratory distress. She has no wheezes.  Abdominal: Soft. Bowel sounds are normal. There is no tenderness. There is no guarding.  Musculoskeletal: Normal range of motion. She exhibits no edema.  No calf asymmetry, tenderness, or palpable cords; no overlying erythema or warmth to touch; DP pulses intact bilaterally  Neurological: She is alert and oriented to person, place, and time.  Skin: Skin is warm and dry. She is not diaphoretic.  Psychiatric: She has a normal mood and affect.  Nursing note and vitals reviewed.   ED Course  Procedures (including critical care time) Labs Review Labs Reviewed  BASIC METABOLIC PANEL - Abnormal; Notable for the following:    CO2 18 (*)    GFR calc non Af Amer 84 (*)    Anion gap 16 (*)    All other components within normal limits  PRO B NATRIURETIC PEPTIDE - Abnormal; Notable for the following:    Pro B Natriuretic peptide (BNP) 479.7 (*)    All other components within normal limits  CBC  I-STAT TROPOININ, ED    Imaging Review Ct Angio Chest Pe W/cm &/or Wo Cm  09/22/2014   CLINICAL DATA:  Chest pain and shortness of breath since yesterday, knee surgery 3 weeks ago, question pulmonary embolism, past history of hypertension, palpitations, atrial fibrillation  EXAM: CT ANGIOGRAPHY CHEST WITH CONTRAST  TECHNIQUE: Multidetector CT imaging of the chest was performed using the standard protocol during bolus administration of intravenous contrast. Multiplanar CT image reconstructions  and MIPs were obtained to evaluate the vascular anatomy.  CONTRAST:  112mL OMNIPAQUE IOHEXOL 350 MG/ML SOLN IV  COMPARISON:  None.  FINDINGS: Aorta normal caliber without aneurysm or dissection.  Minimal atherosclerotic calcification aorta.  Pulmonary arteries well opacified and patent.  No evidence of pulmonary embolism.  No thoracic adenopathy.  Visualized portion of upper abdomen unremarkable.  Minimal bibasilar atelectasis and dependent density.  No pulmonary infiltrate, pleural effusion, or pneumothorax.  No definite pulmonary mass/ nodule.  Osseous structures unremarkable.  Review of the MIP images confirms the above findings.  IMPRESSION: No evidence of pulmonary embolism.  Minimal atelectasis.  No other intra thoracic abnormalities.   Electronically Signed   By: Lavonia Dana M.D.   On: 09/22/2014 20:05  Dg Chest Port 1 View  09/22/2014   CLINICAL DATA:  Shortness of breath since yesterday, history atrial fibrillation, hypertension  EXAM: PORTABLE CHEST - 1 VIEW  COMPARISON:  Portable exam 2003 hr compared to 12/18/2013  FINDINGS: Normal heart size, mediastinal contours, and pulmonary vascularity.  Atherosclerotic calcification aorta.  Mild chronic bronchitic changes.  No acute infiltrate, pleural effusion or pneumothorax.  Minimal atelectasis at RIGHT base.  IMPRESSION: Mild chronic bronchitic changes with RIGHT basilar atelectasis.   Electronically Signed   By: Lavonia Dana M.D.   On: 09/22/2014 20:29     EKG Interpretation None      MDM   Final diagnoses:  SOB (shortness of breath)   68 year old female with new onset shortness of breath yesterday.  She had recent knee surgery by Dr. Lorre Nick for meniscal tear. Since here at the insistence of her cardiologist for rule out of PE. Patient has no prior history of DVT or PE.  On exam, respirations are unlabored and O2 sats were stable on room air. Her lungs are clear bilaterally. She does not appear clinically fluid overloaded. There are no  peripheral signs of DVT. Will obtain EKG, labs, CTA of chest.  EKG normal sinus rhythm without ischemic change. Troponin negative. Lab work is reassuring, bnp mildly elevated (unsure of baseline). Chest x-ray negative for acute process, mild atelectasis noted. CT imaging negative for PE, no acute findings.  On the emergency department, patient's vital signs have remained stable on room air. She has had no signs of respiratory distress. She has been able to get out of bed and ambulate to the bathroom unassisted multiple times without dizziness, lightheadedness, or shortness of breath.  At this time, feel patient stable for discharge.  Possibly NSAID induced bronchospasm.  I have offered albuterol inhaler for PRN use, patient does not feel this is needed.  She will FU with PCP and/or cardiology.  Discussed plan with patient, he/she acknowledged understanding and agreed with plan of care.  Return precautions given for new or worsening symptoms.  Larene Pickett, PA-C 09/22/14 2118  Debby Freiberg, MD 09/27/14 [phone]

## 2014-09-23 NOTE — Telephone Encounter (Signed)
Patient wanted to confirm that Dr. [name] is ok with her taking celebrex. She also had questions about her labs from hospital yesterday. She reports SOB is improving but still present, also states feel like she is retaining fluid. Going out of the country on 12/22, requesting appointment with Dr. [name] prior to her travel. Appointment made, advised patient to keep a diary of daily weights until her appointment and bring it with her. Verbalizes understanding and agreement and is appreciative of the phone call and the appointment.

## 2014-09-23 NOTE — Telephone Encounter (Signed)
Follow up     Questions regarding celebrex.

## 2014-09-27 ENCOUNTER — Encounter: Payer: Self-pay | Admitting: Internal Medicine

## 2014-09-27 ENCOUNTER — Ambulatory Visit (INDEPENDENT_AMBULATORY_CARE_PROVIDER_SITE_OTHER): Payer: Medicare Other | Admitting: Internal Medicine

## 2014-09-27 VITALS — BP 120/74 | HR 82 | Temp 98.1°F | Resp 14 | Wt 229.1 lb

## 2014-09-27 DIAGNOSIS — R7989 Other specified abnormal findings of blood chemistry: Secondary | ICD-10-CM

## 2014-09-27 DIAGNOSIS — J9811 Atelectasis: Secondary | ICD-10-CM

## 2014-09-27 DIAGNOSIS — R799 Abnormal finding of blood chemistry, unspecified: Secondary | ICD-10-CM | POA: Diagnosis not present

## 2014-09-27 DIAGNOSIS — R0600 Dyspnea, unspecified: Secondary | ICD-10-CM

## 2014-09-27 NOTE — Progress Notes (Signed)
Pre visit review using our clinic review tool, if applicable. No additional management support is needed unless otherwise documented below in the visit note. 

## 2014-09-27 NOTE — Progress Notes (Signed)
   Subjective:    Patient ID: Amanda Wilkins, female    DOB: May 29, 1946, 68 y.o.   MRN: [id number]  HPI   She was seen in emergency room 09/22/14 with dyspnea. This was in the setting of having had arthroscopy several days before Thanksgiving.  She had taken 800 mg of ibuprofen a half pill twice a day during the 48 hours prior to going to the emergency room. She also was prescribed Celebrex which she started 12/11. She is on prophylactic 325 mg aspirin  The one view chest x-ray suggested minimal atelectasis in the right lower lobe. CT angiogram revealed no evidence of pulmonary emboli.  Her BNP was mildly elevated at 479.7.  Troponin was negative;EKG WNL  She is not having significant symptoms at this time but is concerned as she will be going to [place] 10/05/14..  She does have a past history of paroxysmal atrial fibrillation.    Review of Systems   Chest pain, palpitations, tachycardia, exertional dyspnea, paroxysmal nocturnal dyspnea, claudication or edema are absent.       Objective:   Physical Exam  Pertinent or positive findings include: She has fusiform changes of the knees with crepitus, especially on the left at the site of her arthroscopy. Homan's negative.  Appears healthy and well-nourished & in no acute distress No carotid bruits are present.No neck vein distention present at 10 - 15 degrees. Thyroid normal to palpation Heart rhythm and rate are normal with no gallop or murmur Chest is clear with no increased work of breathing There is no evidence of aortic aneurysm or renal artery bruits Abdomen soft with no organomegaly or masses. No HJR No clubbing, cyanosis or edema present. Pedal pulses are intact  No ischemic skin changes are present . Fingernails healthy  Alert and oriented. Strength, tone normal         Assessment & Plan:  #1 dyspnea , essentially resolved #2 minimal ATX RLL (films reviewed) #3 mildly elevated BNP; ? Related NSAIDS &/or  HRT. Cardiology appt 12/18.   See AVS

## 2014-09-27 NOTE — Patient Instructions (Signed)
Atelectasis is a process in which there is not full inspiration of  the lungs; areas tend to fold on themselves. This can lead to secretion retention and active infection.Please  blowup 5- 10  balloons a day to enhance inflation of the lungs and prevent atelectasis as we discussed.

## 2014-10-01 ENCOUNTER — Ambulatory Visit (INDEPENDENT_AMBULATORY_CARE_PROVIDER_SITE_OTHER): Payer: Medicare Other | Admitting: Internal Medicine

## 2014-10-01 ENCOUNTER — Encounter: Payer: Self-pay | Admitting: Internal Medicine

## 2014-10-01 VITALS — BP 130/77 | HR 74 | Ht 66.0 in | Wt 227.0 lb

## 2014-10-01 DIAGNOSIS — R0602 Shortness of breath: Secondary | ICD-10-CM

## 2014-10-01 DIAGNOSIS — I480 Paroxysmal atrial fibrillation: Secondary | ICD-10-CM

## 2014-10-01 DIAGNOSIS — Z87898 Personal history of other specified conditions: Secondary | ICD-10-CM

## 2014-10-01 DIAGNOSIS — R002 Palpitations: Secondary | ICD-10-CM

## 2014-10-01 DIAGNOSIS — I10 Essential (primary) hypertension: Secondary | ICD-10-CM | POA: Diagnosis not present

## 2014-10-01 NOTE — Progress Notes (Signed)
Amanda Wilkins Date of Birth: 1946/08/18 Medical Record #[id number]  History of Present Illness: Amanda Wilkins is a 68 yo with history of CP,  obesity and PAF.   I saw her back in Aug 2014.  She was seen by Tommas Olp in Jan 2015.  Had echo done which was normal.  Did not make appt for myoview.   The patient had meniscal surgery a couple wks ago.  ON 12/9 called with SOB  Told to go to ER  CXR showed mild atelectasis.  CT was neg for PE  [facility] was d/c'd home. SInce then herbreathing has improved.  She denies CP   Seen by [facility] yesterday. Patient has been taking big breaths multiple times per day as instructed.  Breathing is OK  No Dizziness  No CP    Current Outpatient Prescriptions  Medication Sig Dispense Refill  . aspirin EC 325 MG tablet Take 325 mg by mouth daily.    [name] atenolol (TENORMIN) 50 MG tablet TAKE 1 TABLET BY MOUTH DAILY 90 tablet 3  . celecoxib (CELEBREX) 200 MG capsule Take 200 mg by mouth daily.    . cholecalciferol (VITAMIN D) 1000 UNITS tablet Take 2,000 Units by mouth daily.     [name] DILT-XR 180 MG 24 hr capsule TAKE 1 CAPSULE BY MOUTH DAILY 90 capsule 3  . estrogens, conjugated, (PREMARIN) 0.3 MG tablet Take daily (Patient taking differently: Take 0.15 mg by mouth daily. Take daily) 30 tablet 11  . fluocinonide cream (LIDEX) 0.09 % Apply 1 application topically 2 (two) times daily.    [name] glycopyrrolate (ROBINUL) 1 MG tablet Take 1-2 mg by mouth 2 (two) times daily. Two tab in the morning and one tab at night    . hydroxychloroquine (PLAQUENIL) 200 MG tablet Take 200 mg by mouth 2 (two) times daily.    . medroxyPROGESTERone (PROVERA) 10 MG tablet TAKE ONE TABLET 10 DAYS OF THE MONTH 30 tablet 4  . sertraline (ZOLOFT) 50 MG tablet Take 1 tablet (50 mg total) by mouth daily. 90 tablet 3   No current facility-administered medications for this visit.    Allergies  Allergen Reactions  . Epinephrine Anaphylaxis  . Aspirin     Can take the coated 325 mg. Plain asa 325 mg  speeds up the heart.  [name] Epinephrine Base   . Penicillins     She took amoxicillin in 2012-2013 and was ok    Past Medical History  Diagnosis Date  . Allergy   . Atrial fibrillation   . Migraines     on Zoloft for migraines  . Hypertension   . Chronic interstitial nephritis   . Depression   . Palpitations   . Obesity   . Arthritis     ON BOTH KNEES    Past Surgical History  Procedure Laterality Date  . Cesarean section    . Dental surgery      multiple implants    History  Smoking status  . Never Smoker   Smokeless tobacco  . Never Used    Comment: H/O social smoking at times when drinking--never a "habit"    History  Alcohol Use  . 8.4 oz/week  . 14 Glasses of wine per week    Comment: WINE- TWO GLASSES AT DINNER    Family History  Problem Relation Age of Onset  . Cancer Brother     PANCREATIC    Review of Systems: The review of systems is per the HPI.  All other  systems were reviewed and are negative.  Physical Exam: BP 130/77 mmHg  Pulse 74  Ht 5\' 6"  (1.676 m)  Wt 227 lb (102.967 kg)  BMI 36.66 kg/m2  SpO2 98% Patient is very pleasant and in no acute distress. She is morbidly obese.   HEENT is unremarkable. Normocephalic/atraumatic. PERRL. Sclera are nonicteric. Neck is supple. No JVD. Lungs are clear. Cardiac exam shows a regular rate and rhythm.   Abdomen is obese but soft. Extremities are without edema. Gait and ROM are intact. No gross neurologic deficits noted.  Wt Readings from Last 3 Encounters:  10/01/14 227 lb (102.967 kg)  09/27/14 229 lb 2 oz (103.93 kg)  07/26/14 227 lb (102.967 kg)     LABORATORY DATA: EKG today shows sinus rhythm.    Lab Results  Component Value Date   WBC 6.1 09/22/2014   HGB 13.4 09/22/2014   HCT 39.1 09/22/2014   PLT 184 09/22/2014   GLUCOSE 96 09/22/2014   CHOL 188 06/11/2013   TRIG 71 06/11/2013   HDL 81 06/11/2013   LDLCALC 93 06/11/2013   ALT 30 05/27/2014   AST 23 05/27/2014   NA 138  09/22/2014   K 4.2 09/22/2014   CL 104 09/22/2014   CREATININE 0.79 09/22/2014   BUN 17 09/22/2014   CO2 18* 09/22/2014   TSH 2.368 05/27/2014   INR 0.9 09/21/2008   HGBA1C 5.4 05/27/2014   ECHO SUMMARY - Overall left ventricular systolic function was normal. Left ventricular ejection fraction was estimated , range being 60 % to 70 %. - Mild MR directed posteriorly in LA.  EKG  SR 76 bpm   Assessment / Plan: 1. SOB  Breating is OK  She is worried about atelectaisis.  I reassured her.    2. PAF - in sinus today  Denies palpitations.   .  3.  CP  Patinet denies

## 2014-10-01 NOTE — Patient Instructions (Signed)
Your physician recommends that you continue on your current medications as directed. Please refer to the Current Medication list given to you today. Your physician recommends that you return for lab work in: January 2016 Your physician wants you to follow-up in: 9 months with Dr. [name].  You will receive a reminder letter in the mail two months in advance. If you don't receive a letter, please call our office to schedule the follow-up appointment.

## 2014-10-15 DIAGNOSIS — Z87448 Personal history of other diseases of urinary system: Secondary | ICD-10-CM

## 2014-10-15 HISTORY — DX: Personal history of other diseases of urinary system: Z87.448

## 2014-11-04 ENCOUNTER — Other Ambulatory Visit: Payer: Medicare Other

## 2014-11-08 DIAGNOSIS — L661 Lichen planopilaris: Secondary | ICD-10-CM | POA: Diagnosis not present

## 2014-11-08 DIAGNOSIS — L658 Other specified nonscarring hair loss: Secondary | ICD-10-CM | POA: Diagnosis not present

## 2014-11-11 ENCOUNTER — Telehealth: Payer: Self-pay | Admitting: Internal Medicine

## 2014-11-11 ENCOUNTER — Other Ambulatory Visit (INDEPENDENT_AMBULATORY_CARE_PROVIDER_SITE_OTHER): Payer: Medicare Other | Admitting: *Deleted

## 2014-11-11 DIAGNOSIS — I10 Essential (primary) hypertension: Secondary | ICD-10-CM | POA: Diagnosis not present

## 2014-11-11 DIAGNOSIS — I480 Paroxysmal atrial fibrillation: Secondary | ICD-10-CM

## 2014-11-11 DIAGNOSIS — R002 Palpitations: Secondary | ICD-10-CM | POA: Diagnosis not present

## 2014-11-11 DIAGNOSIS — Z87898 Personal history of other specified conditions: Secondary | ICD-10-CM | POA: Diagnosis not present

## 2014-11-11 DIAGNOSIS — R0602 Shortness of breath: Secondary | ICD-10-CM | POA: Diagnosis not present

## 2014-11-11 LAB — BASIC METABOLIC PANEL
BUN: 14 mg/dL (ref 6–23)
CHLORIDE: 104 meq/L (ref 96–112)
CO2: 24 mEq/L (ref 19–32)
Calcium: 9.4 mg/dL (ref 8.4–10.5)
Creatinine, Ser: 0.8 mg/dL (ref 0.40–1.20)
GFR: 75.83 mL/min (ref >60.00)
GLUCOSE: 116 mg/dL — AB (ref 70–99)
POTASSIUM: 4.8 meq/L (ref 3.5–5.1)
Sodium: 136 mEq/L (ref 135–145)

## 2014-11-11 LAB — LIPID PANEL
CHOLESTEROL: 178 mg/dL (ref 0–200)
HDL: 68.7 mg/dL (ref >39.00)
LDL CALC: 77 mg/dL (ref 0–99)
NonHDL: 109.3
Total CHOL/HDL Ratio: 3
Triglycerides: 160 mg/dL — ABNORMAL HIGH (ref 0.0–149.0)
VLDL: 32 mg/dL (ref 0.0–40.0)

## 2014-11-11 LAB — BRAIN NATRIURETIC PEPTIDE: PRO B NATRI PEPTIDE: 173 pg/mL — AB (ref 0.0–100.0)

## 2014-11-11 NOTE — Telephone Encounter (Signed)
Called patient back. Per Dr. [name], Labs look good, Lipids are excellent, Fluid is up minimally, and I would keep on same regimen. Patient is concerned about her BNP being elevated at 173 and Triglycerides being high at 160. Informed patient a diet low carbohydrates and exercise could help with her triglyceride levels. Encouraged patient according to result note that these levels are not high enough to cause a concern. Informed patient that a message would be forward to Dr. [name] for further explanation of these elevated levels.

## 2014-11-11 NOTE — Telephone Encounter (Signed)
New Message        Pt calling stating that she had some questions about one of her lab results that she received from our office. Please call back and advise.

## 2014-11-20 ENCOUNTER — Ambulatory Visit (INDEPENDENT_AMBULATORY_CARE_PROVIDER_SITE_OTHER): Payer: Medicare Other | Admitting: Internal Medicine

## 2014-11-20 VITALS — BP 130/80 | HR 92 | Temp 99.0°F | Resp 16 | Ht 66.5 in | Wt 226.0 lb

## 2014-11-20 DIAGNOSIS — R350 Frequency of micturition: Secondary | ICD-10-CM

## 2014-11-20 LAB — POCT UA - MICROSCOPIC ONLY
Casts, Ur, LPF, POC: NEGATIVE
Crystals, Ur, HPF, POC: NEGATIVE
MUCUS UA: NEGATIVE
Yeast, UA: NEGATIVE

## 2014-11-20 LAB — POCT URINALYSIS DIPSTICK
Bilirubin, UA: NEGATIVE
Glucose, UA: NEGATIVE
Ketones, UA: NEGATIVE
NITRITE UA: POSITIVE
PH UA: 7
Protein, UA: 30
Spec Grav, UA: 1.025
UROBILINOGEN UA: 0.2

## 2014-11-20 MED ORDER — SULFAMETHOXAZOLE-TRIMETHOPRIM 800-160 MG PO TABS: 1.0000 | ORAL_TABLET | Freq: Two times a day (BID) | ORAL | Status: DC

## 2014-11-20 NOTE — Progress Notes (Signed)
Subjective:  This chart was scribed for Tami Lin, MD by Dellis Filbert, ED Scribe at [facility].The patient was seen in exam room 01 and the patient's care was started at 1:41 PM.   Patient ID: [name], female    DOB: 01/23/1946, 69 y.o.   MRN: [id number] Chief Complaint  Patient presents with  . Urinary Frequency    last night  . Chills    Last night   HPI HPI Comments: Nechelle Petrizzo is a 69 y.o. female who presents to [facility] complaining of a possible UTI, onset last night. She has chills, increased urinary frequency (x10), pressure sensation and back pain. Pt was exercising yesterday which she states may attribute to her back pain. She has had one other incident of a UTI in the past, this was several years ago and she is unsure of an exact date. She denies fever, dysuria and hematuria.    Patient Active Problem List   Diagnosis Date Noted  . Elevated MCV 05/25/2014  . Snoring 12/09/2013  . Otitis, externa, infective 04/23/2013  . Post-menopausal bleeding 02/10/2013  . Post-vaccination reaction 01/16/2013  . Increased endometrial stripe thickness 01/16/2013  . Weight gain 01/06/2013  . Symptomatic menopausal or female climacteric states 01/06/2013  . History of ovarian cyst 01/06/2013  . Mouth ulcer 06/09/2012  . LBP (low back pain) 05/09/2012  . Dermatitis of ear canal 05/09/2012  . Upper respiratory disease 10/22/2011  . Menopausal symptoms 06/22/2011  . Alopecia, unspecified 02/11/2011  . CONJUNCTIVITIS, ACUTE 10/18/2010  . RENAL CYST 11/11/2009  . TOBACCO USE, QUIT 10/12/2009  . HIP PAIN, LEFT 08/04/2009  . UNSPECIFIED MYALGIA AND MYOSITIS 04/12/2009  . VISION IMPAIRMENT, LOW VISION, ONE EYE-LEFT 06/23/2008  . BLEPHARITIS 06/23/2008  . Headache 06/23/2008  . SWEATING 04/07/2008  . SYNCOPE 02/17/2008  . COUGH 11/14/2007  . PAP SMEAR, ABNORMAL 11/14/2007  . ALLERGIC RHINITIS 08/14/2007  . DEPRESSION 07/28/2007  . Atrial  fibrillation 07/28/2007  . Palpitations 07/28/2007   Past Medical History  Diagnosis Date  . Allergy   . Atrial fibrillation   . Migraines     on Zoloft for migraines  . Hypertension   . Chronic interstitial nephritis   . Depression   . Palpitations   . Obesity   . Arthritis     ON BOTH KNEES   Past Surgical History  Procedure Laterality Date  . Cesarean section    . Dental surgery      multiple implants   Allergies  Allergen Reactions  . Epinephrine Anaphylaxis  . Aspirin     Can take the coated 325 mg. Plain asa 325 mg speeds up the heart.  [name] Epinephrine Base   . Penicillins     She took amoxicillin in 2012-2013 and was ok   Prior to Admission medications   Medication Sig Start Date End Date Taking? Authorizing Provider  aspirin EC 325 MG tablet Take 325 mg by mouth daily.   Yes Historical Provider, MD  atenolol (TENORMIN) 50 MG tablet TAKE 1 TABLET BY MOUTH DAILY 03/22/14  Yes Aleksei Plotnikov V, MD  celecoxib (CELEBREX) 200 MG capsule Take 200 mg by mouth daily.   Yes Historical Provider, MD  cholecalciferol (VITAMIN D) 1000 UNITS tablet Take 2,000 Units by mouth daily.    Yes Historical Provider, MD  DILT-XR 180 MG 24 hr capsule TAKE 1 CAPSULE BY MOUTH DAILY 08/05/14  Yes Aleksei Plotnikov V, MD  estrogens, conjugated, (PREMARIN) 0.3 MG tablet Take  daily Patient taking differently: Take 0.15 mg by mouth daily. Take daily 01/19/14  Yes Terrance Mass, MD  fluocinonide cream (LIDEX) 9.79 % Apply 1 application topically 2 (two) times daily.   Yes Historical Provider, MD  glycopyrrolate (ROBINUL) 1 MG tablet Take 1-2 mg by mouth 2 (two) times daily. Two tab in the morning and one tab at night   Yes Historical Provider, MD  medroxyPROGESTERone (PROVERA) 10 MG tablet TAKE ONE TABLET 10 DAYS OF THE MONTH 01/19/14  Yes Terrance Mass, MD  sertraline (ZOLOFT) 50 MG tablet Take 1 tablet (50 mg total) by mouth daily. 01/19/14  Yes Terrance Mass, MD  hydroxychloroquine  (PLAQUENIL) 200 MG tablet Take 200 mg by mouth 2 (two) times daily.    Historical Provider, MD   Review of Systems  Constitutional: Positive for chills. Negative for fever.  Gastrointestinal: Negative for abdominal pain.  Genitourinary: Positive for frequency. Negative for dysuria and hematuria.  Musculoskeletal: Positive for back pain.       Objective:  BP 130/80 mmHg  Pulse 92  Temp(Src) 99 F (37.2 C) (Oral)  Resp 16  Ht 5' 6.5" (1.689 m)  Wt 226 lb (102.513 kg)  BMI 35.94 kg/m2  SpO2 98%  Physical Exam  Constitutional: She is oriented to person, place, and time. She appears well-developed and well-nourished. No distress.  HENT:  Head: Normocephalic and atraumatic.  Eyes: Pupils are equal, round, and reactive to light.  Neck: Normal range of motion.  Cardiovascular: Normal rate and regular rhythm.   Pulmonary/Chest: Effort normal. No respiratory distress.  Abdominal:  No cva tend to perc  Musculoskeletal: Normal range of motion.  Neurological: She is alert and oriented to person, place, and time.  Skin: Skin is warm and dry.  Psychiatric: She has a normal mood and affect. Her behavior is normal.  Nursing note and vitals reviewed.  Results for orders placed or performed in visit on 11/20/14  POCT urinalysis dipstick  Result Value Ref Range   Color, UA yellow    Clarity, UA cloudy    Glucose, UA neg    Bilirubin, UA neg    Ketones, UA neg    Spec Grav, UA 1.025    Blood, UA small    pH, UA 7.0    Protein, UA 30    Urobilinogen, UA 0.2    Nitrite, UA positive    Leukocytes, UA large (3+)   POCT UA - Microscopic Only  Result Value Ref Range   WBC, Ur, HPF, POC tntc    RBC, urine, microscopic 10-15    Bacteria, U Microscopic 4+    Mucus, UA neg    Epithelial cells, urine per micros 3-5    Crystals, Ur, HPF, POC neg    Casts, Ur, LPF, POC neg    Yeast, UA neg        Assessment & Plan:  prob 1=uti #3 lifetime  Meds ordered this encounter  Medications    . sulfamethoxazole-trimethoprim (BACTRIM DS,SEPTRA DS) 800-160 MG per tablet    Sig: Take 1 tablet by mouth 2 (two) times daily.    Dispense:  10 tablet    Refill:  0   cult I have completed the patient encounter in its entirety as documented by the scribe, with editing by me where necessary. Robert P. Laney Pastor, M.D.

## 2014-11-22 ENCOUNTER — Telehealth: Payer: Self-pay | Admitting: *Deleted

## 2014-11-22 NOTE — Telephone Encounter (Signed)
[facility] [place] Patient Name: Amanda Wilkins Gender: Female DOB: [birth year] Age: 69 Y 11 M 15 D Return Phone Number: [phone] (Primary), [phone] (Secondary) Address: [address] City/State/Zip: [place] [address] [name] - Client Client Site [place] - Night Physician Plotnikov, Alex Contact Type Call Call Type Triage / Clinical Relationship To Patient Self Return Phone Number [phone] (Primary) Chief Complaint Cold Symptom Initial Comment Caller states she has the chills, cannot get warm, urinating a lot. PreDisposition Did not know what to do Nurse Assessment Nurse: Luther Parody, RN, Malachy Mood Date/Time (Eastern Time): 11/20/2014 10:28:02 AM Confirm and document reason for call. If symptomatic, describe symptoms. ---Caller states that she had severe chills intermittently during the night along with frequent urination. Denies fever or pain. Has the patient traveled out of the country within the last 30 days? ---Not Applicable Does the patient require triage? ---Yes Related visit to physician within the last 2 weeks? ---No Does the PT have any chronic conditions? (i.e. diabetes, asthma, etc.) ---Yes List chronic conditions. ---htn, depression Guidelines Guideline Title Affirmed Question Affirmed Notes Nurse Date/Time (Eastern Time) Urinary Symptoms Side (flank) or lower back pain present Luther Parody, RN, Malachy Mood 11/20/2014 10:29:37 AM Disp. Time Eilene Ghazi Time) Disposition Final User 11/20/2014 10:33:48 AM See Physician within 24 Hours Yes Luther Parody, RN, Erskine Speed Understands: Yes Disagree/Comply: Comply Care Advice Given Per Guideline SEE PHYSICIAN WITHIN 24 HOURS: CARE ADVICE given per Urinary Symptoms (Adult) guideline. * You become worse. * Fever occurs CALL BACK IF: PLEASE NOTE: All timestamps contained within this report are  represented as [place] Standard Time. CONFIDENTIALTY NOTICE: This fax transmission is intended only for the addressee. It contains information that is legally privileged, confidential or otherwise protected from use or disclosure. If you are not the intended recipient, you are strictly prohibited from reviewing, disclosing, copying using or disseminating any of this information or taking any action in reliance on or regarding this information. If you have received this fax in error, please notify us immediately by telephone so that we can arrange for its return to [place]. Phone: [phone], Toll-Free: [phone], Fax: [phone] Page: 2 of 2 Call Id: [id number] After Care Instructions Given Call Event Type User Date / Time Description Referrals [facility]

## 2014-11-23 LAB — URINE CULTURE: Colony Count: >=100000

## 2014-11-30 ENCOUNTER — Ambulatory Visit (INDEPENDENT_AMBULATORY_CARE_PROVIDER_SITE_OTHER): Payer: Medicare Other | Admitting: Physician Assistant

## 2014-11-30 ENCOUNTER — Encounter: Payer: Self-pay | Admitting: Physician Assistant

## 2014-11-30 VITALS — BP 124/66 | HR 80 | Ht 66.0 in | Wt 224.0 lb

## 2014-11-30 DIAGNOSIS — I480 Paroxysmal atrial fibrillation: Secondary | ICD-10-CM

## 2014-11-30 DIAGNOSIS — R635 Abnormal weight gain: Secondary | ICD-10-CM

## 2014-11-30 DIAGNOSIS — R0609 Other forms of dyspnea: Secondary | ICD-10-CM | POA: Diagnosis not present

## 2014-11-30 NOTE — Assessment & Plan Note (Signed)
Patient has steadily gained weight over the past several years. Recommend Weight Watchers and decreasing her alcohol intake. Hopefully she can begin an exercise program if her stress test is normal.

## 2014-11-30 NOTE — Progress Notes (Signed)
Cardiology Office Note   Date:  11/30/2014   ID:  Amanda Wilkins, DOB [birth year], MRN [id number]  PCP:  [name], MD  Cardiologist:  [name], MD  Chief Complaint: Shortness of breath  History of Present Illness: Amanda Wilkins is a 69 y.o. female who presents for further complaints of shortness of breath. She has a history of PAF, chest pain and obesity. 2-D echo in 12/2013 EF 60-70%. She had meniscal surgery of her knee last fall and since then has complained of dyspnea on exertion. She had a CT scan that ruled out a pulmonary embolus. She says whenever she walks up a flight of stairs or does any heavy exertion she gets out of breath. She can ride on her exercise bike for 30 minutes without any resistance without any trouble. She has steadily gained weight over the past several years and drinks 3 glasses of wine daily. She has very little exercise tolerance.    Past Medical History  Diagnosis Date  . Allergy   . Atrial fibrillation   . Migraines     on Zoloft for migraines  . Hypertension   . Chronic interstitial nephritis   . Depression   . Palpitations   . Obesity   . Arthritis     ON BOTH KNEES    Past Surgical History  Procedure Laterality Date  . Cesarean section    . Dental surgery      multiple implants     Current Outpatient Prescriptions  Medication Sig Dispense Refill  . aspirin EC 325 MG tablet Take 325 mg by mouth daily.    [name] atenolol (TENORMIN) 50 MG tablet TAKE 1 TABLET BY MOUTH DAILY 90 tablet 3  . celecoxib (CELEBREX) 200 MG capsule Take 200 mg by mouth daily.    . cholecalciferol (VITAMIN D) 1000 UNITS tablet Take 2,000 Units by mouth daily.     [name] DILT-XR 180 MG 24 hr capsule TAKE 1 CAPSULE BY MOUTH DAILY 90 capsule 3  . estrogens, conjugated, (PREMARIN) 0.3 MG tablet Take daily (Patient taking differently: Take 0.15 mg by mouth daily. Take daily) 30 tablet 11  . fluocinonide cream (LIDEX) 4.09 % Apply 1 application topically 2 (two)  times daily.    [name] glycopyrrolate (ROBINUL) 1 MG tablet Take 1-2 mg by mouth 2 (two) times daily. Two tab in the morning and one tab at night    . medroxyPROGESTERone (PROVERA) 10 MG tablet TAKE ONE TABLET 10 DAYS OF THE MONTH 30 tablet 4  . sertraline (ZOLOFT) 50 MG tablet Take 1 tablet (50 mg total) by mouth daily. 90 tablet 3   No current facility-administered medications for this visit.    Allergies:   Epinephrine; Aspirin; Epinephrine base; and Penicillins    Social History:  The patient  reports that she has never smoked. She has never used smokeless tobacco. She reports that she drinks about 8.4 oz of alcohol per week. She reports that she does not use illicit drugs.   Family History:  The patient's family history includes Cancer in her brother.    ROS:  Please see the history of present illness.   Otherwise, review of systems are positive for none.   All other systems are reviewed and negative.    PHYSICAL EXAM: VS:  BP 124/66 mmHg  Pulse 80  Ht 5\' 6"  (1.676 m)  Wt 224 lb (101.606 kg)  BMI 36.17 kg/m2  SpO2 98% , BMI Body mass index is 36.17 kg/(m^2). GEN: Obese, well developed,  in no acute distress HEENT: normal Neck: no JVD, HJR, carotid bruits, or masses Cardiac: RRR; distant heart sounds, no gallop ,murmurs, rubs, thrill or heave,no edema,   Respiratory:  clear to auscultation bilaterally, normal work of breathing GI: soft, nontender, nondistended, + BS MS: no deformity or atrophy Extremities: without cyanosis, clubbing, edema, good distal pulses bilaterally.  Skin: warm and dry, no rash Neuro:  Strength and sensation are intact Psych: euthymic mood, full affect   EKG:  EKG is not ordered today.    Recent Labs: 05/27/2014: ALT 30; TSH 2.368 09/22/2014: Hemoglobin 13.4; Platelets 184 11/11/2014: BUN 14; Creatinine 0.80; Potassium 4.8; Pro B Natriuretic peptide (BNP) 173.0*; Sodium 136    Lipid Panel    Component Value Date/Time   CHOL 178 11/11/2014 0943    TRIG 160.0* 11/11/2014 0943   HDL 68.70 11/11/2014 0943   CHOLHDL 3 11/11/2014 0943   VLDL 32.0 11/11/2014 0943   LDLCALC 77 11/11/2014 0943      Wt Readings from Last 3 Encounters:  11/30/14 224 lb (101.606 kg)  11/20/14 226 lb (102.513 kg)  10/01/14 227 lb (102.967 kg)      Other studies Reviewed: Additional studies/ records that were reviewed today include: 2-D echo 12/2013 Review of the above records demonstrates:  Study Conclusions  - Left ventricle: The cavity size was normal. Systolic   function was vigorous. The estimated ejection fraction was   in the range of 65% to 70%. Wall motion was normal; there   were no regional wall motion abnormalities. Left   ventricular diastolic function parameters were normal. - Aortic valve: No regurgitation. - Aortic root: The aortic root was normal in size. - Mitral valve: No regurgitation. - Left atrium: The atrium was at the upper limits of normal   in size. - Right ventricle: Systolic function was normal. - Right atrium: The atrium was normal in size. - Tricuspid valve: No regurgitation. - Pulmonic valve: No regurgitation. - Pulmonary arteries: Systolic pressure was within the   normal range. - Inferior vena cava: The vessel was normal in size. - Pericardium, extracardiac: There was no pericardial   effusion.  ECHO SUMMARY - Overall left ventricular systolic function was normal. Left ventricular ejection fraction was estimated , range being 60 % to 70 %. - Mild MR directed posteriorly in LA.    Atrial fibrillation Patient's heart rate is regular on exam. I did not do an EKG. I suppose some of her symptoms could be coming from PAF. If her stress Myoview was normal could consider event recorder.   Dyspnea on exertion Patient has had dyspnea on exertion ever since her meniscal surgery. I suspect some of this is from deconditioning and her obesity. Will check stress Myoview to rule out ischemia. I recommended weight loss  program such as Weight Watchers or [place]'s weight loss program. Also recommend decreasing her alcohol intake dramatically.   Weight gain Patient has steadily gained weight over the past several years. Recommend Weight Watchers and decreasing her alcohol intake. Hopefully she can begin an exercise program if her stress test is normal.      Signed, Ermalinda Barrios, PA-C  11/30/2014 11:23 AM    [facility] [place], [place], [place]  [address] Phone: [phone]; Fax: [phone]

## 2014-11-30 NOTE — Patient Instructions (Signed)
Your physician recommends that you continue on your current medications as directed. Please refer to the Current Medication list given to you today.    Your physician has requested that you have en exercise stress myoview. For further information please visit [email or web address]. Please follow instruction sheet, as given.   Your physician recommends that you schedule a follow-up appointment in:  WITH DR ROSS IN ONE MONTH

## 2014-11-30 NOTE — Assessment & Plan Note (Signed)
Patient has had dyspnea on exertion ever since her meniscal surgery. I suspect some of this is from deconditioning and her obesity. Will check stress Myoview to rule out ischemia. I recommended weight loss program such as Weight Watchers or [place]'s weight loss program. Also recommend decreasing her alcohol intake dramatically.

## 2014-11-30 NOTE — Assessment & Plan Note (Signed)
Patient's heart rate is regular on exam. I did not do an EKG. I suppose some of her symptoms could be coming from PAF. If her stress Myoview was normal could consider event recorder.

## 2014-12-06 ENCOUNTER — Encounter: Payer: Self-pay | Admitting: Gynecology

## 2014-12-06 ENCOUNTER — Ambulatory Visit (INDEPENDENT_AMBULATORY_CARE_PROVIDER_SITE_OTHER): Payer: Medicare Other | Admitting: Gynecology

## 2014-12-06 VITALS — BP 130/82

## 2014-12-06 DIAGNOSIS — R300 Dysuria: Secondary | ICD-10-CM

## 2014-12-06 DIAGNOSIS — M545 Low back pain, unspecified: Secondary | ICD-10-CM

## 2014-12-06 DIAGNOSIS — R339 Retention of urine, unspecified: Secondary | ICD-10-CM | POA: Diagnosis not present

## 2014-12-06 DIAGNOSIS — R102 Pelvic and perineal pain: Secondary | ICD-10-CM | POA: Diagnosis not present

## 2014-12-06 DIAGNOSIS — N3000 Acute cystitis without hematuria: Secondary | ICD-10-CM

## 2014-12-06 LAB — URINALYSIS W MICROSCOPIC + REFLEX CULTURE
Bilirubin Urine: NEGATIVE
Casts: NONE SEEN
Crystals: NONE SEEN
GLUCOSE, UA: NEGATIVE mg/dL
Hgb urine dipstick: NEGATIVE
Ketones, ur: NEGATIVE mg/dL
NITRITE: POSITIVE — AB
PH: 6 (ref 5.0–8.0)
Protein, ur: NEGATIVE mg/dL
SPECIFIC GRAVITY, URINE: 1.02 (ref 1.005–1.030)
Squamous Epithelial / LPF: NONE SEEN
UROBILINOGEN UA: 0.2 mg/dL (ref 0.0–1.0)

## 2014-12-06 MED ORDER — PHENAZOPYRIDINE HCL 200 MG PO TABS: 200.0000 mg | ORAL_TABLET | Freq: Three times a day (TID) | ORAL | Status: DC | PRN

## 2014-12-06 MED ORDER — NITROFURANTOIN MONOHYD MACRO 100 MG PO CAPS: ORAL_CAPSULE | ORAL | Status: DC

## 2014-12-06 NOTE — Progress Notes (Addendum)
   Patient is a 69 year old who presented to the office today with a complaint of pelvic pressure and low back discomfort and some nausea. She states that she has been urinating intermittently but with dysuria. She denied any fever or chills or any vaginal discharge. This is been going on for several days. Early this month she was treated for urinary tract infection as well and the microdebrider organism identified was Escherichia coli and she was placed on Bactrim DS for 7 days. The sensitivity indicated that the antibiotic was sensitive to the microdebrider organism. On further questioning disappears have occurred twice after intercourse.  Exam: Blood pressure 130/82  Well developed well nourished female with complaint of inability to void over the past few hours Back: No CVA tenderness Abdomen: Soft nontender no rebound or guarding Pelvic: Bartholin urethra Skene was within normal limits Vagina: No lesions or discharge Cervix: No lesions or discharge Uterus: Anteverted normal size shape and consistency Adnexa: No palpable masses or tenderness Rectal exam: Not done  Since patient was unable to void for the past 2 hours her urethra was cleansed with Betadine solution and a sterile catheter was introduced into the bladder for an intermittent catheterization to empty her bladder for approximately 50 cc. Urine was cloudy and submitted for urinalysis and culture.  Urinalysis: 21-50 WBC, many bacteria, 0-2 RBC  Assessment/plan: Patient symptoms and findings on urinalysis significant for a urinary tract infection. She will be placed on Macrobid one by mouth twice a day for 7 days. I've also given her refills to take 1 tablet after intercourse in the future in the event of honeymoon cystitis. For now also I will call in Pyridium 200 mg to take 1 by mouth 3 times a day for 3 days for her bladder spasm.

## 2014-12-06 NOTE — Patient Instructions (Signed)
Phenazopyridine tablets What is this medicine? PHENAZOPYRIDINE (fen az oh PEER i deen) is a pain reliever. It is used to stop the pain, burning, or discomfort caused by infection or irritation of the urinary tract. This medicine is not an antibiotic. It will not cure a urinary tract infection. This medicine may be used for other purposes; ask your health care provider or pharmacist if you have questions. COMMON BRAND NAME(S): AZO, Azo-100, Azo-Gesic, Azo-Septic, Azo-Standard, Phenazo, Prodium, Pyridium, Urinary Analgesic, Uristat What should I tell my health care provider before I take this medicine? They need to know if you have any of these conditions: -glucose-6-phosphate dehydrogenase (G6PD) deficiency -kidney disease -an unusual or allergic reaction to phenazopyridine, other medicines, foods, dyes, or preservatives -pregnant or trying to get pregnant -breast-feeding How should I use this medicine? Take this medicine by mouth with a glass of water. Follow the directions on the prescription label. Take after meals. Take your doses at regular intervals. Do not take your medicine more often than directed. Do not skip doses or stop your medicine early even if you feel better. Do not stop taking except on your doctor's advice. Talk to your pediatrician regarding the use of this medicine in children. Special care may be needed. Overdosage: If you think you have taken too much of this medicine contact a poison control center or emergency room at once. NOTE: This medicine is only for you. Do not share this medicine with others. What if I miss a dose? If you miss a dose, take it as soon as you can. If it is almost time for your next dose, take only that dose. Do not take double or extra doses. What may interact with this medicine? Interactions are not expected. This list may not describe all possible interactions. Give your health care provider a list of all the medicines, herbs, non-prescription  drugs, or dietary supplements you use. Also tell them if you smoke, drink alcohol, or use illegal drugs. Some items may interact with your medicine. What should I watch for while using this medicine? Tell your doctor or health care professional if your symptoms do not improve or if they get worse. This medicine colors body fluids red. This effect is harmless and will go away after you are done taking the medicine. It will change urine to an dark orange or red color. The red color may stain clothing. Soft contact lenses may become permanently stained. It is best not to wear soft contact lenses while taking this medicine. If you are diabetic you may get a false positive result for sugar in your urine. Talk to your health care provider. What side effects may I notice from receiving this medicine? Side effects that you should report to your doctor or health care professional as soon as possible: -allergic reactions like skin rash, itching or hives, swelling of the face, lips, or tongue -blue or purple color of the skin -difficulty breathing -fever -less urine -unusual bleeding, bruising -unusual tired, weak -vomiting -yellowing of the eyes or skin Side effects that usually do not require medical attention (report to your doctor or health care professional if they continue or are bothersome): -dark urine -headache -stomach upset This list may not describe all possible side effects. Call your doctor for medical advice about side effects. You may report side effects to FDA at 1-800-FDA-1088. Where should I keep my medicine? Keep out of the reach of children. Store at room temperature between 15 and 30 degrees C (59 and 86   degrees F). Protect from light and moisture. Throw away any unused medicine after the expiration date. NOTE: This sheet is a summary. It may not cover all possible information. If you have questions about this medicine, talk to your doctor, pharmacist, or health care provider.   2015, Elsevier/Gold Standard. (2008-04-29 11:04:07) Nitrofurantoin tablets or capsules What is this medicine? NITROFURANTOIN (nye troe fyoor AN toyn) is an antibiotic. It is used to treat urinary tract infections. This medicine may be used for other purposes; ask your health care provider or pharmacist if you have questions. COMMON BRAND NAME(S): Macrobid, Macrodantin, Urotoin What should I tell my health care provider before I take this medicine? They need to know if you have any of these conditions: -anemia -diabetes -glucose-6-phosphate dehydrogenase deficiency -kidney disease -liver disease -lung disease -other chronic illness -an unusual or allergic reaction to nitrofurantoin, other antibiotics, other medicines, foods, dyes or preservatives -pregnant or trying to get pregnant -breast-feeding How should I use this medicine? Take this medicine by mouth with a glass of water. Follow the directions on the prescription label. Take this medicine with food or milk. Take your doses at regular intervals. Do not take your medicine more often than directed. Do not stop taking except on your doctor's advice. Talk to your pediatrician regarding the use of this medicine in children. While this drug may be prescribed for selected conditions, precautions do apply. Overdosage: If you think you have taken too much of this medicine contact a poison control center or emergency room at once. NOTE: This medicine is only for you. Do not share this medicine with others. What if I miss a dose? If you miss a dose, take it as soon as you can. If it is almost time for your next dose, take only that dose. Do not take double or extra doses. What may interact with this medicine? -antacids containing magnesium trisilicate -probenecid -quinolone antibiotics like ciprofloxacin, lomefloxacin, norfloxacin and ofloxacin -sulfinpyrazone This list may not describe all possible interactions. Give your health care  provider a list of all the medicines, herbs, non-prescription drugs, or dietary supplements you use. Also tell them if you smoke, drink alcohol, or use illegal drugs. Some items may interact with your medicine. What should I watch for while using this medicine? Tell your doctor or health care professional if your symptoms do not improve or if you get new symptoms. Drink several glasses of water a day. If you are taking this medicine for a long time, visit your doctor for regular checks on your progress. If you are diabetic, you may get a false positive result for sugar in your urine with certain brands of urine tests. Check with your doctor. What side effects may I notice from receiving this medicine? Side effects that you should report to your doctor or health care professional as soon as possible: -allergic reactions like skin rash or hives, swelling of the face, lips, or tongue -chest pain -cough -difficulty breathing -dizziness, drowsiness -fever or infection -joint aches or pains -pale or blue-tinted skin -redness, blistering, peeling or loosening of the skin, including inside the mouth -tingling, burning, pain, or numbness in hands or feet -unusual bleeding or bruising -unusually weak or tired -yellowing of eyes or skin Side effects that usually do not require medical attention (report to your doctor or health care professional if they continue or are bothersome): -dark urine -diarrhea -headache -loss of appetite -nausea or vomiting -temporary hair loss This list may not describe all possible side effects. Call   your doctor for medical advice about side effects. You may report side effects to FDA at 1-800-FDA-1088. Where should I keep my medicine? Keep out of the reach of children. Store at room temperature between 15 and 30 degrees C (59 and 86 degrees F). Protect from light. Throw away any unused medicine after the expiration date. NOTE: This sheet is a summary. It may not cover  all possible information. If you have questions about this medicine, talk to your doctor, pharmacist, or health care provider.  2015, Elsevier/Gold Standard. (2008-04-21 15:56:47) Urinary Tract Infection Urinary tract infections (UTIs) can develop anywhere along your urinary tract. Your urinary tract is your body's drainage system for removing wastes and extra water. Your urinary tract includes two kidneys, two ureters, a bladder, and a urethra. Your kidneys are a pair of bean-shaped organs. Each kidney is about the size of your fist. They are located below your ribs, one on each side of your spine. CAUSES Infections are caused by microbes, which are microscopic organisms, including fungi, viruses, and bacteria. These organisms are so small that they can only be seen through a microscope. Bacteria are the microbes that most commonly cause UTIs. SYMPTOMS  Symptoms of UTIs may vary by age and gender of the patient and by the location of the infection. Symptoms in young women typically include a frequent and intense urge to urinate and a painful, burning feeling in the bladder or urethra during urination. Older women and men are more likely to be tired, shaky, and weak and have muscle aches and abdominal pain. A fever may mean the infection is in your kidneys. Other symptoms of a kidney infection include pain in your back or sides below the ribs, nausea, and vomiting. DIAGNOSIS To diagnose a UTI, your caregiver will ask you about your symptoms. Your caregiver also will ask to provide a urine sample. The urine sample will be tested for bacteria and white blood cells. White blood cells are made by your body to help fight infection. TREATMENT  Typically, UTIs can be treated with medication. Because most UTIs are caused by a bacterial infection, they usually can be treated with the use of antibiotics. The choice of antibiotic and length of treatment depend on your symptoms and the type of bacteria causing your  infection. HOME CARE INSTRUCTIONS  If you were prescribed antibiotics, take them exactly as your caregiver instructs you. Finish the medication even if you feel better after you have only taken some of the medication.  Drink enough water and fluids to keep your urine clear or pale yellow.  Avoid caffeine, tea, and carbonated beverages. They tend to irritate your bladder.  Empty your bladder often. Avoid holding urine for long periods of time.  Empty your bladder before and after sexual intercourse.  After a bowel movement, women should cleanse from front to back. Use each tissue only once. SEEK MEDICAL CARE IF:   You have back pain.  You develop a fever.  Your symptoms do not begin to resolve within 3 days. SEEK IMMEDIATE MEDICAL CARE IF:   You have severe back pain or lower abdominal pain.  You develop chills.  You have nausea or vomiting.  You have continued burning or discomfort with urination. MAKE SURE YOU:   Understand these instructions.  Will watch your condition.  Will get help right away if you are not doing well or get worse. Document Released: 07/11/2005 Document Revised: 04/01/2012 Document Reviewed: 11/09/2011 ExitCare Patient Information 2015 [employer]. This information is   not intended to replace advice given to you by your health care provider. Make sure you discuss any questions you have with your health care provider.  

## 2014-12-09 ENCOUNTER — Encounter (HOSPITAL_COMMUNITY): Payer: Medicare Other

## 2014-12-09 LAB — URINE CULTURE: Colony Count: >=100000

## 2014-12-16 ENCOUNTER — Ambulatory Visit (HOSPITAL_COMMUNITY): Payer: Medicare Other | Attending: Physician Assistant | Admitting: Radiology

## 2014-12-16 DIAGNOSIS — R0602 Shortness of breath: Secondary | ICD-10-CM | POA: Diagnosis not present

## 2014-12-16 DIAGNOSIS — R002 Palpitations: Secondary | ICD-10-CM | POA: Insufficient documentation

## 2014-12-16 DIAGNOSIS — R5383 Other fatigue: Secondary | ICD-10-CM | POA: Diagnosis not present

## 2014-12-16 DIAGNOSIS — R0609 Other forms of dyspnea: Secondary | ICD-10-CM | POA: Diagnosis not present

## 2014-12-16 DIAGNOSIS — I480 Paroxysmal atrial fibrillation: Secondary | ICD-10-CM | POA: Diagnosis not present

## 2014-12-16 DIAGNOSIS — I10 Essential (primary) hypertension: Secondary | ICD-10-CM | POA: Insufficient documentation

## 2014-12-16 DIAGNOSIS — R9431 Abnormal electrocardiogram [ECG] [EKG]: Secondary | ICD-10-CM | POA: Diagnosis not present

## 2014-12-16 DIAGNOSIS — Z9289 Personal history of other medical treatment: Secondary | ICD-10-CM

## 2014-12-16 HISTORY — DX: Personal history of other medical treatment: Z92.89

## 2014-12-16 MED ORDER — TECHNETIUM TC 99M SESTAMIBI GENERIC - CARDIOLITE
30.0000 | Freq: Once | INTRAVENOUS | Status: AC | PRN
  Administered 2014-12-16: 30 via INTRAVENOUS

## 2014-12-16 NOTE — Progress Notes (Signed)
[facility] [address] [place], [place] [address] [phone]    Cardiology Nuclear Med Study  Amanda Wilkins is a 69 y.o. female     MRN : [id number]     DOB: 11/11/1945  Procedure Date: 12/16/2014  Nuclear Med Background Indication for Stress Test:  Evaluation for Ischemia and Abnormal EKG History:  MPI in [place], H/O  AFIB  Cardiac Risk Factors: Hypertension  Symptoms:  DOE, Fatigue, Palpitations and SOB   Nuclear Pre-Procedure Caffeine/Decaff Intake:  None NPO After: 7:30pm   Lungs:  clear O2 Sat: 98% on room air. IV 0.9% NS with Angio Cath:  22g  IV Site: R Hand  IV Started by:  Crissie Figures, RN  Chest Size (in):  38 Cup Size: C  Height: 5\' 6"  (1.676 m)  Weight:  226 lb (102.513 kg)  BMI:  Body mass index is 36.49 kg/(m^2). Tech Comments:  Rx this am without Industrial/product designer Med Study 1 or 2 day study: 2 day  Stress Test Type:  Stress  Reading MD: n/a  Order Authorizing Provider:  P.Ross  Resting Radionuclide: Technetium 77m Sestamibi  Resting Radionuclide Dose: 33.0 mCi  On      12-20-14  Stress Radionuclide:  Technetium 44m Sestamibi  Stress Radionuclide Dose: 33.0 mCi  On         12-16-14          Stress Protocol Rest HR: 80 Stress HR: 151  Rest BP: 153/78 Stress BP: 200/49  Exercise Time (min): 4;01 METS: 5.80   Predicted Max HR: 152 bpm % Max HR: 99.34 bpm Rate Pressure Product: 30200   Dose of Adenosine (mg):  n/a Dose of Lexiscan: n/a mg  Dose of Atropine (mg): n/a Dose of Dobutamine: n/a mcg/kg/min (at max HR)  Stress Test Technologist: Perrin Maltese, EMT-P  Nuclear Technologist:  Earl Many, CNMT     Rest Procedure:  Myocardial perfusion imaging was performed at rest 45 minutes following the intravenous administration of Technetium 63m Sestamibi. Rest ECG: NSR - Normal EKG  Stress Procedure:  The patient exercised on the treadmill utilizing the Bruce Protocol for 4:01 minutes. The patient stopped due to sob,  fatigue, and denied any chest pain.  Technetium 81m Sestamibi was injected at peak exercise and myocardial perfusion imaging was performed after a brief delay. Stress ECG: No significant change from baseline ECG  QPS Raw Data Images:  Normal; no motion artifact; normal heart/lung ratio. Stress Images:  There is decreased uptake in the basal and mid inferolateral and mid inferior wall.  Rest Images:  Normal homogeneous uptake in all areas of the myocardium. Subtraction (SDS):  There is medium size, moderate severity reversible defect in the basal and mid inferolateral and mid inferior walls.  Transient Ischemic Dilatation (Normal <1.22):  1.04 Lung/Heart Ratio (Normal <0.45):  0.34  Quantitative Gated Spect Images QGS EDV:  85 ml QGS ESV:  21 ml  Impression Exercise Capacity:  Fair exercise capacity. BP Response:  Hypertensive blood pressure response. Clinical Symptoms:  There is dyspnea. ECG Impression:  No significant ST segment change suggestive of ischemia. Comparison with Prior Nuclear Study: No images to compare  Overall Impression:  Intermediate risk stress nuclear study with a medium size moderate severity reversible defect in the basal and mid inferolateral and mid inferior walls consistent with ischemia in the RCA/LCX territory. .  LV Ejection Fraction: 75%.  LV Wall Motion:  NL LV Function; NL Wall Motion  Dorothy Spark  12/20/2014

## 2014-12-20 ENCOUNTER — Ambulatory Visit (HOSPITAL_COMMUNITY): Payer: Medicare Other | Attending: Internal Medicine

## 2014-12-20 DIAGNOSIS — R0989 Other specified symptoms and signs involving the circulatory and respiratory systems: Secondary | ICD-10-CM

## 2014-12-20 MED ORDER — TECHNETIUM TC 99M SESTAMIBI GENERIC - CARDIOLITE
33.0000 | Freq: Once | INTRAVENOUS | Status: AC | PRN
  Administered 2014-12-20: 33 via INTRAVENOUS

## 2014-12-27 DIAGNOSIS — H31091 Other chorioretinal scars, right eye: Secondary | ICD-10-CM | POA: Diagnosis not present

## 2014-12-27 DIAGNOSIS — H2513 Age-related nuclear cataract, bilateral: Secondary | ICD-10-CM | POA: Diagnosis not present

## 2014-12-28 DIAGNOSIS — M13862 Other specified arthritis, left knee: Secondary | ICD-10-CM | POA: Diagnosis not present

## 2014-12-30 ENCOUNTER — Ambulatory Visit: Payer: Medicare Other | Admitting: Internal Medicine

## 2015-01-04 ENCOUNTER — Encounter: Payer: Self-pay | Admitting: Internal Medicine

## 2015-01-04 ENCOUNTER — Ambulatory Visit (INDEPENDENT_AMBULATORY_CARE_PROVIDER_SITE_OTHER): Payer: Medicare Other | Admitting: Internal Medicine

## 2015-01-04 VITALS — BP 122/78 | HR 87 | Temp 97.9°F | Ht 66.0 in | Wt 229.0 lb

## 2015-01-04 DIAGNOSIS — R040 Epistaxis: Secondary | ICD-10-CM

## 2015-01-04 DIAGNOSIS — H6012 Cellulitis of left external ear: Secondary | ICD-10-CM | POA: Diagnosis not present

## 2015-01-04 MED ORDER — CLARITHROMYCIN ER 500 MG PO TB24: 1000.0000 mg | ORAL_TABLET | Freq: Every day | ORAL | Status: DC

## 2015-01-04 MED ORDER — MUPIROCIN 2 % EX OINT: TOPICAL_OINTMENT | CUTANEOUS | Status: DC

## 2015-01-04 NOTE — Patient Instructions (Signed)
Plain Mucinex (NOT D) for thick secretions ;force NON dairy fluids .   Nasal cleansing in the shower as discussed with lather of mild shampoo.After 10 seconds wash off lather while  exhaling through nostrils. Make sure that all residual soap is removed to prevent irritation.  Flonase OR Nasacort AQ 1 spray in each nostril twice a day as needed. Use the "crossover" technique into opposite nostril spraying toward opposite ear @ 45 degree angle, not straight up into nostril.  Plain Allegra (NOT D )  160 daily , Loratidine 10 mg , OR Zyrtec 10 mg @ bedtime  as needed for itchy eyes & sneezing.  Fill the  prescription for antibiotic if ear no better over the next 48-72 hours.

## 2015-01-04 NOTE — Progress Notes (Signed)
   Subjective:    Patient ID: Amanda Wilkins, female    DOB: 01-08-1946, 69 y.o.   MRN: [id number]  HPI For the last several days she's noted soreness in the left ear. She questioned whether there was a bump in this area. Last night at about 3 AM she noticed clear drainage on the pillow. She's had some associated aching below the left ear. She denies any associated hearing loss or tinnitus.  She also has had some bleeding from the right nasal passage.  She has no other upper respiratory tract infection symptoms or bleeding dyscrasias.  Review of Systems Frontal headache, facial pain , nasal purulence, dental pain, sore throat , otic pain or otic discharge denied. No fever , chills or sweats. Hemoptysis, hematuria, melena, or rectal bleeding denied. No unexplained weight loss, significant dyspepsia,dysphagia, or abdominal pain.  There is no abnormal bruising , bleeding, or difficulty stopping bleeding with injury.    Objective:   Physical Exam  Pertinent or positive findings include : There is visible subcutaneous swelling in the left external canal which partially occludes the otic canal. This is tender to palpation. There is faint erythema without pustule formation.  Dried blood is noted in the right nare.  General appearance:Adequately nourished; no acute distress or increased work of breathing is present.   Lymphatic: No  lymphadenopathy about the head, neck, or axilla . Eyes: No conjunctival inflammation or lid edema is present. There is no scleral icterus. Ears:  External ear exam shows no significant lesions or deformities.  R ear WNL. Nose:  External nasal examination shows no deformity or inflammation. No septal dislocation or deviation.No obstruction to airflow.  Oral exam: Dental hygiene is good; lips and gums are healthy appearing.There is no oropharyngeal erythema or exudate . Neck:  No deformities, thyromegaly, masses, or tenderness noted.   Supple with full range of  motion without pain. Heart:  Normal rate and regular rhythm. S1 and S2 normal without gallop, murmur, click, rub or other extra sounds.  Lungs:Chest clear to auscultation; no wheezes, rhonchi,rales ,or rubs present. Extremities:  No cyanosis, edema, or clubbing  noted  Skin: Warm & dry w/o tenting or jaundice. No significant lesions or rash.        Assessment & Plan:  #1 cellulitis left external otic canal  #2 epistaxis right nare  Plan: See orders and recommendations

## 2015-01-04 NOTE — Progress Notes (Signed)
Pre visit review using our clinic review tool, if applicable. No additional management support is needed unless otherwise documented below in the visit note. 

## 2015-01-13 ENCOUNTER — Other Ambulatory Visit: Payer: Self-pay

## 2015-01-13 DIAGNOSIS — Z1231 Encounter for screening mammogram for malignant neoplasm of breast: Secondary | ICD-10-CM

## 2015-01-13 DIAGNOSIS — Z803 Family history of malignant neoplasm of breast: Secondary | ICD-10-CM

## 2015-01-17 ENCOUNTER — Telehealth: Payer: Self-pay | Admitting: Internal Medicine

## 2015-01-17 NOTE — Telephone Encounter (Signed)
PT CALLING TO FU ON TEST SHE WAS TO HAVE AND WE WERE WAITING TO HEAR FROM HER INSURANCE CO  FROM ABOUT 3 WEEKS AGO, CALLING TO FOLLOW UP [phone]

## 2015-01-17 NOTE — Telephone Encounter (Signed)
Dr. [name] aware, will contact patient.

## 2015-01-21 ENCOUNTER — Telehealth: Payer: Self-pay | Admitting: Internal Medicine

## 2015-01-21 ENCOUNTER — Ambulatory Visit: Payer: Medicare Other | Admitting: Family

## 2015-01-21 NOTE — Telephone Encounter (Signed)
Patient no showed for having pain under her arm with Marya Amsler.  Please advise.

## 2015-01-22 ENCOUNTER — Emergency Department (HOSPITAL_COMMUNITY): Payer: Medicare Other

## 2015-01-22 ENCOUNTER — Other Ambulatory Visit (HOSPITAL_COMMUNITY): Payer: Self-pay

## 2015-01-22 ENCOUNTER — Telehealth: Payer: Self-pay | Admitting: Physician Assistant

## 2015-01-22 ENCOUNTER — Inpatient Hospital Stay (HOSPITAL_COMMUNITY)
Admission: EM | Admit: 2015-01-22 | Discharge: 2015-02-09 | DRG: 871 | Disposition: A | Discharge status: Skilled Nursing Facility | Payer: Medicare Other | Attending: Internal Medicine | Admitting: Internal Medicine

## 2015-01-22 ENCOUNTER — Encounter (HOSPITAL_COMMUNITY): Payer: Self-pay | Admitting: *Deleted

## 2015-01-22 DIAGNOSIS — E669 Obesity, unspecified: Secondary | ICD-10-CM | POA: Diagnosis present

## 2015-01-22 DIAGNOSIS — M79629 Pain in unspecified upper arm: Secondary | ICD-10-CM | POA: Diagnosis present

## 2015-01-22 DIAGNOSIS — Z9989 Dependence on other enabling machines and devices: Secondary | ICD-10-CM

## 2015-01-22 DIAGNOSIS — E860 Dehydration: Secondary | ICD-10-CM | POA: Diagnosis present

## 2015-01-22 DIAGNOSIS — L03311 Cellulitis of abdominal wall: Secondary | ICD-10-CM | POA: Diagnosis present

## 2015-01-22 DIAGNOSIS — B001 Herpesviral vesicular dermatitis: Secondary | ICD-10-CM | POA: Diagnosis not present

## 2015-01-22 DIAGNOSIS — G43909 Migraine, unspecified, not intractable, without status migrainosus: Secondary | ICD-10-CM | POA: Diagnosis present

## 2015-01-22 DIAGNOSIS — R002 Palpitations: Secondary | ICD-10-CM | POA: Diagnosis not present

## 2015-01-22 DIAGNOSIS — J9601 Acute respiratory failure with hypoxia: Secondary | ICD-10-CM | POA: Diagnosis not present

## 2015-01-22 DIAGNOSIS — I10 Essential (primary) hypertension: Secondary | ICD-10-CM | POA: Diagnosis present

## 2015-01-22 DIAGNOSIS — D638 Anemia in other chronic diseases classified elsewhere: Secondary | ICD-10-CM | POA: Diagnosis not present

## 2015-01-22 DIAGNOSIS — N289 Disorder of kidney and ureter, unspecified: Secondary | ICD-10-CM | POA: Diagnosis not present

## 2015-01-22 DIAGNOSIS — R109 Unspecified abdominal pain: Secondary | ICD-10-CM | POA: Diagnosis present

## 2015-01-22 DIAGNOSIS — R0602 Shortness of breath: Secondary | ICD-10-CM

## 2015-01-22 DIAGNOSIS — E872 Acidosis: Secondary | ICD-10-CM | POA: Diagnosis present

## 2015-01-22 DIAGNOSIS — K121 Other forms of stomatitis: Secondary | ICD-10-CM | POA: Diagnosis not present

## 2015-01-22 DIAGNOSIS — I4891 Unspecified atrial fibrillation: Secondary | ICD-10-CM | POA: Diagnosis present

## 2015-01-22 DIAGNOSIS — K8020 Calculus of gallbladder without cholecystitis without obstruction: Secondary | ICD-10-CM | POA: Diagnosis present

## 2015-01-22 DIAGNOSIS — G9340 Encephalopathy, unspecified: Secondary | ICD-10-CM | POA: Diagnosis not present

## 2015-01-22 DIAGNOSIS — N179 Acute kidney failure, unspecified: Secondary | ICD-10-CM | POA: Diagnosis present

## 2015-01-22 DIAGNOSIS — D65 Disseminated intravascular coagulation [defibrination syndrome]: Secondary | ICD-10-CM | POA: Diagnosis not present

## 2015-01-22 DIAGNOSIS — I481 Persistent atrial fibrillation: Secondary | ICD-10-CM | POA: Diagnosis present

## 2015-01-22 DIAGNOSIS — I4819 Other persistent atrial fibrillation: Secondary | ICD-10-CM | POA: Diagnosis present

## 2015-01-22 DIAGNOSIS — M25519 Pain in unspecified shoulder: Secondary | ICD-10-CM | POA: Diagnosis present

## 2015-01-22 DIAGNOSIS — B950 Streptococcus, group A, as the cause of diseases classified elsewhere: Secondary | ICD-10-CM | POA: Diagnosis present

## 2015-01-22 DIAGNOSIS — Z7982 Long term (current) use of aspirin: Secondary | ICD-10-CM

## 2015-01-22 DIAGNOSIS — Z8619 Personal history of other infectious and parasitic diseases: Secondary | ICD-10-CM

## 2015-01-22 DIAGNOSIS — L03313 Cellulitis of chest wall: Secondary | ICD-10-CM | POA: Diagnosis present

## 2015-01-22 DIAGNOSIS — R0600 Dyspnea, unspecified: Secondary | ICD-10-CM | POA: Diagnosis present

## 2015-01-22 DIAGNOSIS — A400 Sepsis due to streptococcus, group A: Principal | ICD-10-CM | POA: Diagnosis present

## 2015-01-22 DIAGNOSIS — I480 Paroxysmal atrial fibrillation: Secondary | ICD-10-CM | POA: Diagnosis not present

## 2015-01-22 DIAGNOSIS — R52 Pain, unspecified: Secondary | ICD-10-CM

## 2015-01-22 DIAGNOSIS — E1165 Type 2 diabetes mellitus with hyperglycemia: Secondary | ICD-10-CM | POA: Diagnosis present

## 2015-01-22 DIAGNOSIS — G4733 Obstructive sleep apnea (adult) (pediatric): Secondary | ICD-10-CM | POA: Diagnosis present

## 2015-01-22 DIAGNOSIS — J9811 Atelectasis: Secondary | ICD-10-CM | POA: Diagnosis not present

## 2015-01-22 DIAGNOSIS — K819 Cholecystitis, unspecified: Secondary | ICD-10-CM | POA: Diagnosis present

## 2015-01-22 DIAGNOSIS — Z452 Encounter for adjustment and management of vascular access device: Secondary | ICD-10-CM

## 2015-01-22 DIAGNOSIS — L0390 Cellulitis, unspecified: Secondary | ICD-10-CM | POA: Diagnosis present

## 2015-01-22 DIAGNOSIS — R6521 Severe sepsis with septic shock: Secondary | ICD-10-CM | POA: Diagnosis present

## 2015-01-22 DIAGNOSIS — B009 Herpesviral infection, unspecified: Secondary | ICD-10-CM | POA: Diagnosis present

## 2015-01-22 DIAGNOSIS — A419 Sepsis, unspecified organism: Secondary | ICD-10-CM | POA: Diagnosis present

## 2015-01-22 DIAGNOSIS — R062 Wheezing: Secondary | ICD-10-CM

## 2015-01-22 DIAGNOSIS — R509 Fever, unspecified: Secondary | ICD-10-CM | POA: Diagnosis present

## 2015-01-22 DIAGNOSIS — R0902 Hypoxemia: Secondary | ICD-10-CM | POA: Diagnosis present

## 2015-01-22 DIAGNOSIS — M79621 Pain in right upper arm: Secondary | ICD-10-CM

## 2015-01-22 DIAGNOSIS — L03111 Cellulitis of right axilla: Secondary | ICD-10-CM | POA: Diagnosis present

## 2015-01-22 DIAGNOSIS — F329 Major depressive disorder, single episode, unspecified: Secondary | ICD-10-CM | POA: Diagnosis present

## 2015-01-22 DIAGNOSIS — E876 Hypokalemia: Secondary | ICD-10-CM | POA: Diagnosis present

## 2015-01-22 DIAGNOSIS — D696 Thrombocytopenia, unspecified: Secondary | ICD-10-CM | POA: Diagnosis present

## 2015-01-22 DIAGNOSIS — N119 Chronic tubulo-interstitial nephritis, unspecified: Secondary | ICD-10-CM | POA: Diagnosis present

## 2015-01-22 DIAGNOSIS — R740 Nonspecific elevation of levels of transaminase and lactic acid dehydrogenase [LDH]: Secondary | ICD-10-CM

## 2015-01-22 DIAGNOSIS — A409 Streptococcal sepsis, unspecified: Secondary | ICD-10-CM | POA: Diagnosis present

## 2015-01-22 DIAGNOSIS — J810 Acute pulmonary edema: Secondary | ICD-10-CM | POA: Diagnosis present

## 2015-01-22 DIAGNOSIS — R739 Hyperglycemia, unspecified: Secondary | ICD-10-CM | POA: Diagnosis present

## 2015-01-22 DIAGNOSIS — R7989 Other specified abnormal findings of blood chemistry: Secondary | ICD-10-CM | POA: Diagnosis present

## 2015-01-22 DIAGNOSIS — N644 Mastodynia: Secondary | ICD-10-CM | POA: Diagnosis not present

## 2015-01-22 DIAGNOSIS — R112 Nausea with vomiting, unspecified: Secondary | ICD-10-CM | POA: Diagnosis present

## 2015-01-22 DIAGNOSIS — R110 Nausea: Secondary | ICD-10-CM | POA: Diagnosis present

## 2015-01-22 DIAGNOSIS — J399 Disease of upper respiratory tract, unspecified: Secondary | ICD-10-CM | POA: Diagnosis present

## 2015-01-22 DIAGNOSIS — J9600 Acute respiratory failure, unspecified whether with hypoxia or hypercapnia: Secondary | ICD-10-CM | POA: Diagnosis present

## 2015-01-22 DIAGNOSIS — K8000 Calculus of gallbladder with acute cholecystitis without obstruction: Secondary | ICD-10-CM | POA: Diagnosis not present

## 2015-01-22 DIAGNOSIS — E871 Hypo-osmolality and hyponatremia: Secondary | ICD-10-CM | POA: Diagnosis present

## 2015-01-22 DIAGNOSIS — Z6841 Body Mass Index (BMI) 40.0 and over, adult: Secondary | ICD-10-CM

## 2015-01-22 DIAGNOSIS — Z79899 Other long term (current) drug therapy: Secondary | ICD-10-CM

## 2015-01-22 DIAGNOSIS — R7401 Elevation of levels of liver transaminase levels: Secondary | ICD-10-CM | POA: Diagnosis present

## 2015-01-22 HISTORY — DX: Personal history of other infectious and parasitic diseases: Z86.19

## 2015-01-22 LAB — BASIC METABOLIC PANEL
Anion gap: 17 — ABNORMAL HIGH (ref 5–15)
BUN: 32 mg/dL — AB (ref 6–23)
CALCIUM: 8.6 mg/dL (ref 8.4–10.5)
CHLORIDE: 94 mmol/L — AB (ref 96–112)
CO2: 16 mmol/L — ABNORMAL LOW (ref 19–32)
Creatinine, Ser: 1.97 mg/dL — ABNORMAL HIGH (ref 0.50–1.10)
GFR, EST AFRICAN AMERICAN: 29 mL/min — AB (ref >90)
GFR, EST NON AFRICAN AMERICAN: 25 mL/min — AB (ref >90)
Glucose, Bld: 156 mg/dL — ABNORMAL HIGH (ref 70–99)
Potassium: 3.8 mmol/L (ref 3.5–5.1)
SODIUM: 127 mmol/L — AB (ref 135–145)

## 2015-01-22 LAB — CBC
HCT: 42.3 % (ref 36.0–46.0)
HEMOGLOBIN: 14.9 g/dL (ref 12.0–15.0)
MCH: 34.6 pg — AB (ref 26.0–34.0)
MCHC: 35.2 g/dL (ref 30.0–36.0)
MCV: 98.1 fL (ref 78.0–100.0)
Platelets: 148 10*3/uL — ABNORMAL LOW (ref 150–400)
RBC: 4.31 MIL/uL (ref 3.87–5.11)
RDW: 13.6 % (ref 11.5–15.5)
WBC: 9.5 10*3/uL (ref 4.0–10.5)

## 2015-01-22 LAB — I-STAT TROPONIN, ED: Troponin i, poc: 0 ng/mL (ref 0.00–0.08)

## 2015-01-22 LAB — PHOSPHORUS: Phosphorus: 3.3 mg/dL (ref 2.3–4.6)

## 2015-01-22 LAB — MAGNESIUM: MAGNESIUM: 1.8 mg/dL (ref 1.5–2.5)

## 2015-01-22 MED ORDER — SODIUM CHLORIDE 0.9 % IV BOLUS (SEPSIS)
1000.0000 mL | Freq: Once | INTRAVENOUS | Status: AC
  Administered 2015-01-22: 1000 mL via INTRAVENOUS

## 2015-01-22 MED ORDER — DILTIAZEM HCL 30 MG PO TABS
30.0000 mg | ORAL_TABLET | Freq: Two times a day (BID) | ORAL | Status: DC
  Filled 2015-01-22: qty 1

## 2015-01-22 MED ORDER — DILTIAZEM HCL 100 MG IV SOLR
5.0000 mg/h | Freq: Once | INTRAVENOUS | Status: AC
  Administered 2015-01-23: 5 mg/h via INTRAVENOUS
  Filled 2015-01-22: qty 100

## 2015-01-22 MED ORDER — DILTIAZEM HCL 25 MG/5ML IV SOLN
15.0000 mg | Freq: Once | INTRAVENOUS | Status: AC
  Administered 2015-01-22: 15 mg via INTRAVENOUS
  Filled 2015-01-22: qty 5

## 2015-01-22 MED ORDER — SODIUM CHLORIDE 0.9 % IV SOLN
INTRAVENOUS | Status: DC
  Administered 2015-01-22: via INTRAVENOUS

## 2015-01-22 NOTE — ED Provider Notes (Addendum)
CSN: [id number]     Arrival date & time 01/22/15  1724 History   First MD Initiated Contact with Patient 01/22/15 1800     Chief Complaint  Patient presents with  . Irregular Heart Beat     (Consider location/radiation/quality/duration/timing/severity/associated sxs/prior Treatment) Patient is a 69 y.o. female presenting with palpitations. The history is provided by the patient. No language interpreter was used.  Palpitations Palpitations quality:  Fast Onset quality:  At rest Duration:  1 day Progression:  Waxing and waning Chronicity:  Recurrent Context comment:  Recent flu like illness Relieved by:  Nothing Worsened by:  Nothing Ineffective treatments:  None tried Associated symptoms: malaise/fatigue, nausea and vomiting   Associated symptoms: no back pain, no chest pain, no chest pressure, no cough, no diaphoresis, no lower extremity edema, no numbness, no shortness of breath and no weakness   Associated symptoms comment:  Diarrhea, fever chills   Past Medical History  Diagnosis Date  . Allergy   . Atrial fibrillation   . Migraines     on Zoloft for migraines  . Hypertension   . Chronic interstitial nephritis   . Depression   . Palpitations   . Obesity   . Arthritis     ON BOTH KNEES   Past Surgical History  Procedure Laterality Date  . Cesarean section    . Dental surgery      multiple implants  . Repair of torn meniscus on left 08/2014 Left 08/2015    Dr. Ronnie Derby at [facility]   Family History  Problem Relation Age of Onset  . Cancer Brother     PANCREATIC   History  Substance Use Topics  . Smoking status: Never Smoker   . Smokeless tobacco: Never Used     Comment: H/O social smoking at times when drinking--never a "habit"  . Alcohol Use: 8.4 oz/week    14 Glasses of wine per week     Comment: WINE- TWO GLASSES AT DINNER   OB History    Gravida Para Term Preterm AB TAB SAB Ectopic Multiple Living   2 2 2       2      Review of Systems  Constitutional:  Positive for fever, chills and malaise/fatigue. Negative for diaphoresis, activity change, appetite change and fatigue.  HENT: Negative for congestion, facial swelling, rhinorrhea and sore throat.   Eyes: Negative for photophobia and discharge.  Respiratory: Negative for cough, chest tightness and shortness of breath.   Cardiovascular: Positive for palpitations. Negative for chest pain and leg swelling.  Gastrointestinal: Positive for nausea and vomiting. Negative for abdominal pain and diarrhea.  Endocrine: Negative for polydipsia and polyuria.  Genitourinary: Negative for dysuria, frequency, difficulty urinating and pelvic pain.  Musculoskeletal: Negative for back pain, arthralgias, neck pain and neck stiffness.  Skin: Negative for color change and wound.  Allergic/Immunologic: Negative for immunocompromised state.  Neurological: Negative for facial asymmetry, weakness, numbness and headaches.  Hematological: Does not bruise/bleed easily.  Psychiatric/Behavioral: Negative for confusion and agitation.      Allergies  Epinephrine; Aspirin; Epinephrine base; and Penicillins  Home Medications   Prior to Admission medications   Medication Sig Start Date End Date Taking? Authorizing Provider  aspirin EC 325 MG tablet Take 325 mg by mouth daily.    Historical Provider, MD  atenolol (TENORMIN) 50 MG tablet TAKE 1 TABLET BY MOUTH DAILY 03/22/14   Aleksei Plotnikov V, MD  celecoxib (CELEBREX) 200 MG capsule Take 200 mg by mouth daily.  Historical Provider, MD  cholecalciferol (VITAMIN D) 1000 UNITS tablet Take 2,000 Units by mouth daily.     Historical Provider, MD  clarithromycin (BIAXIN XL) 500 MG 24 hr tablet Take 2 tablets (1,000 mg total) by mouth daily. 01/04/15   Hendricks Limes, MD  DILT-XR 180 MG 24 hr capsule TAKE 1 CAPSULE BY MOUTH DAILY 08/05/14   Lew Dawes V, MD  estrogens, conjugated, (PREMARIN) 0.3 MG tablet Take daily Patient taking differently: Take 0.15 mg by mouth  daily. Take daily 01/19/14   Terrance Mass, MD  fluocinonide cream (LIDEX) 7.82 % Apply 1 application topically 2 (two) times daily.    Historical Provider, MD  glycopyrrolate (ROBINUL) 1 MG tablet Take 1-2 mg by mouth 2 (two) times daily. Two tab in the morning and one tab at night    Historical Provider, MD  medroxyPROGESTERone (PROVERA) 10 MG tablet TAKE ONE TABLET 10 DAYS OF THE MONTH 01/19/14   Terrance Mass, MD  mupirocin ointment (BACTROBAN) 2 % Applied twice a day to the affected area;NOT into eyes. 01/04/15   Hendricks Limes, MD  nitrofurantoin, macrocrystal-monohydrate, (MACROBID) 100 MG capsule Take one twice a day for one week then take one after intercourse 12/06/14   Terrance Mass, MD  sertraline (ZOLOFT) 50 MG tablet Take 1 tablet (50 mg total) by mouth daily. 01/19/14   Terrance Mass, MD   BP 103/49 mmHg  Pulse 115  Temp(Src) 97.7 F (36.5 C)  Resp 18  SpO2 97% Physical Exam  Constitutional: She is oriented to person, place, and time. She appears well-developed and well-nourished. No distress.  HENT:  Head: Normocephalic and atraumatic.  Mouth/Throat: No oropharyngeal exudate.  Eyes: Pupils are equal, round, and reactive to light.  Neck: Normal range of motion. Neck supple.  Cardiovascular: Normal heart sounds.  An irregularly irregular rhythm present. Tachycardia present.  Exam reveals no gallop and no friction rub.   No murmur heard. Pulmonary/Chest: Effort normal and breath sounds normal. No respiratory distress. She has no wheezes. She has no rales.  Abdominal: Soft. Bowel sounds are normal. She exhibits no distension and no mass. There is no tenderness. There is no rebound and no guarding.  Musculoskeletal: Normal range of motion. She exhibits no edema or tenderness.  Neurological: She is alert and oriented to person, place, and time.  Skin: Skin is warm and dry.  Psychiatric: She has a normal mood and affect.    ED Course  Procedures (including critical care  time) Labs Review Labs Reviewed  BASIC METABOLIC PANEL - Abnormal; Notable for the following:    Sodium 127 (*)    Chloride 94 (*)    CO2 16 (*)    Glucose, Bld 156 (*)    BUN 32 (*)    Creatinine, Ser 1.97 (*)    GFR calc non Af Amer 25 (*)    GFR calc Af Amer 29 (*)    Anion gap 17 (*)    All other components within normal limits  CBC - Abnormal; Notable for the following:    MCH 34.6 (*)    Platelets 148 (*)    All other components within normal limits  INFLUENZA VIRUS AG, A+B (DFA)  MAGNESIUM  PHOSPHORUS  CK  URINALYSIS, ROUTINE W REFLEX MICROSCOPIC  TSH  HEPATIC FUNCTION PANEL  LIPASE, BLOOD  CBC  CREATININE, SERUM  TROPONIN I  TROPONIN I  TROPONIN I  I-STAT TROPOININ, ED    Imaging Review No results found.  EKG Interpretation None      ED ECG REPORT   Date: 01/22/2015  Rate: 127  Rhythm: atrial fibrillation and with rapid ventricular response  QRS Axis: normal  Intervals: normal  ST/T Wave abnormalities: normal  Conduction Disutrbances:none  Narrative Interpretation:   Old EKG Reviewed: Most recent with NSR< but priors showing afib with RVR  I have personally reviewed the EKG tracing and agree with the computerized printout as noted.  CRITICAL CARE Performed by: Ernestina Patches, E Total critical care time: 35 Critical care time was exclusive of separately billable procedures and treating other patients. Critical care was necessary to treat or prevent imminent or life-threatening deterioration. Critical care was time spent personally by me on the following activities: development of treatment plan with patient and/or surrogate as well as nursing, discussions with consultants, evaluation of patient's response to treatment, examination of patient, obtaining history from patient or surrogate, ordering and performing treatments and interventions, ordering and review of laboratory studies, ordering and review of radiographic studies, pulse oximetry and  re-evaluation of patient's condition.   MDM   Final diagnoses:  Atrial fibrillation with RVR  Dehydration  Acute renal insufficiency    Pt is a 69 y.o. female with Pmhx as above who presents with irregular, tachy palpitations today in setting of recent influenza-like illness for about 1 week. She denies CP, SOB, ab pain, leg pain/swelling. On PE, BP 98 systolic, HR 098-119'J in afib, lungs clear, no LE swelling.   HR 90's after 15mg  IV dilt, but still in afib.  Labs show hyponatremia, hypokalemia and acute renal failure with a creatinine 1.97, MB 132.  I feel that this is likely function of dehydration given patient's recent illness with nausea, vomiting and diarrhea.  Patient's chads2vasc score is 3. Cardiology Consulted, will see in ED, will rec starting anticoagulants, though pt at this point would like to avoid coumadin.  Will continue hydrating. [facility] will admit, have requested CXR.  HR initially controlled after 1 dose diltiezem, but gradually increased and pt started in dilt gtt in ED.       Ernestina Patches, MD 01/22/15 4782  Ernestina Patches, MD 01/22/15 [phone]

## 2015-01-22 NOTE — ED Notes (Signed)
Patient transported to X-ray 

## 2015-01-22 NOTE — Telephone Encounter (Signed)
Amanda Wilkins is a 69 y.o. female with a hx of PAF.  She has had recent flu like symptoms with fever of 101.  Also notes tender mass in axilla that sounds like an abscess.  Today, she notes her HR in the 150s.  She is concerned she is in AFib.  No chest pain.  Feels SOB. No syncope.  I have advised the patient to go to the ED. She agrees with this plan. Richardson Dopp, PA-C   01/22/2015 4:02 PM

## 2015-01-22 NOTE — ED Notes (Signed)
hospitalist at bedside

## 2015-01-22 NOTE — Consult Note (Signed)
Reason for Consult: palpitations and atrial fibrillation with RVR Primary Cardiologist: Dr. [name] Referring Physician: Dr. Tawnya Wilkins and Amanda Wilkins is an 69 y.o. female.  HPI: [name] is a 69 yo woman with PMH of paroxysmal atrial fibrillation on large aspirin, obesity, hypertension who has had presumed infection (? Flu) with fever, chills, nausea/vomiting/diarrhea, body aches about 1-2 weeks ago that has persisted (? Gradually improved) with sick contacts who has not really kept her medications down and developed tachypalpitations. She was noted to be in atrial fibrillation with RVR in the ER. Her HR improved some with IV diltiazem and fluid resuscitation (3L in during my consultation). Her creatinine was also noted to be doubled to 2.0 with sodium of 127. She wanted to go home but I let her know that acute renal failure in this setting means she should be admitted. She tells me she will take a NOAC but not warfarin. No bleeding issues - no dark stools, no known upcoming surgeries. She had a recent abnormal stress test. She had a flu vaccine this year.    Past Medical History  Diagnosis Date  . Allergy   . Atrial fibrillation   . Migraines     on Zoloft for migraines  . Hypertension   . Chronic interstitial nephritis   . Depression   . Palpitations   . Obesity   . Arthritis     ON BOTH KNEES    Past Surgical History  Procedure Laterality Date  . Cesarean section    . Dental surgery      multiple implants  . Repair of torn meniscus on left 08/2014 Left 08/2015    Dr. Ronnie Derby at [facility]    Family History  Problem Relation Age of Onset  . Cancer Brother     PANCREATIC    Social History:  reports that she has never smoked. She has never used smokeless tobacco. She reports that she drinks about 8.4 oz of alcohol per week. She reports that she does not use illicit drugs.  Allergies:  Allergies  Allergen Reactions  . Epinephrine Other (See Comments)    Heart rate soars  . Aspirin     Can take the coated 325 mg. Plain asa 325 mg speeds up the heart.  [name] Epinephrine Base Other (See Comments)    Heart rate soars  . Penicillins Other (See Comments)    She took amoxicillin in 2012-2013 and was ok    Medications: I have reviewed the patient's current medications. Prior to Admission:  (Not in a hospital admission) Scheduled:  Results for orders placed or performed during the hospital encounter of 01/22/15 (from the past 48 hour(s))  Basic metabolic panel     Status: Abnormal   Collection Time: 01/22/15  5:50 PM  Result Value Ref Range   Sodium 127 (L) 135 - 145 mmol/L   Potassium 3.8 3.5 - 5.1 mmol/L   Chloride 94 (L) 96 - 112 mmol/L   CO2 16 (L) 19 - 32 mmol/L   Glucose, Bld 156 (H) 70 - 99 mg/dL   BUN 32 (H) 6 - 23 mg/dL   Creatinine, Ser 1.97 (H) 0.50 - 1.10 mg/dL   Calcium 8.6 8.4 - 10.5 mg/dL   GFR calc non Af Amer 25 (L) >90 mL/min   GFR calc Af Amer 29 (L) >90 mL/min    Comment: (NOTE) The eGFR has been calculated using the CKD EPI equation. This calculation has not been validated in all clinical situations.  eGFR's persistently <90 mL/min signify possible Chronic Kidney Disease.    Anion gap 17 (H) 5 - 15  CBC     Status: Abnormal   Collection Time: 01/22/15  5:50 PM  Result Value Ref Range   WBC 9.5 4.0 - 10.5 K/uL   RBC 4.31 3.87 - 5.11 MIL/uL   Hemoglobin 14.9 12.0 - 15.0 g/dL   HCT 42.3 36.0 - 46.0 %   MCV 98.1 78.0 - 100.0 fL   MCH 34.6 (H) 26.0 - 34.0 pg   MCHC 35.2 30.0 - 36.0 g/dL   RDW 13.6 11.5 - 15.5 %   Platelets 148 (L) 150 - 400 K/uL  Magnesium     Status: None   Collection Time: 01/22/15  5:50 PM  Result Value Ref Range   Magnesium 1.8 1.5 - 2.5 mg/dL  Phosphorus     Status: None   Collection Time: 01/22/15  5:50 PM  Result Value Ref Range   Phosphorus 3.3 2.3 - 4.6 mg/dL  I-stat troponin, ED (do not order at [facility])     Status: None   Collection Time: 01/22/15  6:07 PM  Result Value Ref Range    Troponin i, poc 0.00 0.00 - 0.08 ng/mL   Comment 3            Comment: Due to the release kinetics of cTnI, a negative result within the first hours of the onset of symptoms does not rule out myocardial infarction with certainty. If myocardial infarction is still suspected, repeat the test at appropriate intervals.     No results found.  Review of Systems  Constitutional: Positive for fever, chills and malaise/fatigue. Negative for weight loss.  HENT: Negative for ear pain and tinnitus.   Eyes: Negative for pain and discharge.  Cardiovascular: Positive for palpitations, orthopnea and leg swelling.  Gastrointestinal: Positive for heartburn, nausea, vomiting and diarrhea. Negative for melena.  Genitourinary: Negative for dysuria and frequency.  Musculoskeletal: Positive for myalgias. Negative for neck pain.  Skin: Negative for rash.  Neurological: Negative for tingling, tremors and sensory change.  Endo/Heme/Allergies: Negative for polydipsia. Does not bruise/bleed easily.  Psychiatric/Behavioral: Negative for depression, suicidal ideas and substance abuse.   Blood pressure 119/50, pulse 82, temperature 97.7 F (36.5 C), resp. rate 18, SpO2 95 %. Physical Exam  Nursing note and vitals reviewed. Constitutional: She is oriented to person, place, and time. She appears well-developed and well-nourished. She appears distressed.  HENT:  Head: Normocephalic and atraumatic.  Nose: Nose normal.  Mouth/Throat: Oropharynx is clear and moist. No oropharyngeal exudate.  Eyes: Conjunctivae and EOM are normal. Pupils are equal, round, and reactive to light. No scleral icterus.  Neck: Normal range of motion. Neck supple. No JVD present. No tracheal deviation present.  Cardiovascular: Normal heart sounds and intact distal pulses.  Exam reveals no gallop.   No murmur heard. Irregularly irregular, tachycardic  Respiratory: Effort normal and breath sounds normal. No respiratory distress. She has no  wheezes.  GI: Soft. Bowel sounds are normal. She exhibits no distension. There is no tenderness.  Musculoskeletal: Normal range of motion. She exhibits no edema or tenderness.  Neurological: She is alert and oriented to person, place, and time. No cranial nerve deficit. Coordination normal.  Skin: Skin is warm. No rash noted. She is diaphoretic. No erythema.  Psychiatric: She has a normal mood and affect. Her behavior is normal. Judgment and thought content normal.   12/2014 Intermediate risk stress nuclear study with a medium size moderate severity  reversible defect in the basal and mid inferolateral and mid inferior walls consistent with ischemia in the RCA/LCX territory.  3/15 Echo: EF 65-70% Labs reviewed; Na 127, K 3.8, bun/cr 32/1.97, h/h 14/43, plt 148, wbc 9.5, Trop 0.00 ECG reviewed; atrial fibrillation with RVR, then 100s low voltage Assessment/Plan: Problem List Atrial fibrillation + RVR Acute illness/infection Hypovolemic Hyponatremia Acute renal failure  Abnormal nuclear stress test   [name] is a 69 yo woman with PMH of paroxysmal atrial fibrillation on large aspirin, obesity, hypertension who has had presumed infection (? Flu) with fever, chills, nausea/vomiting/diarrhea, body aches about 1-2 weeks ago that has persisted (? Gradually improved) with sick contacts who has not really kept her medications down and developed tachypalpitations. Atrial fibrillation + RVR: differential is idiopathic, ischemia, heart failure, drugs, thyroid disease, infection among other etiologies. She has a clear trigger for her atrial fibrillation with recent illness/infection. Gentle rate control reasonable. Can start with low dose IV diltiazem. Will start heparin gtt for atrial fibrillation CHADS2VASC 3 given acute renal failure. Defer treatment/evaluation for infection to hospitalist service. Appreciate assistance. She appears to need continued IV fluid resuscitation.    - telemetry,  trend cardiac markers, diltiazem gtt - IV fluids for acute renal failure - strongly consider antibiotics; urinalysis - hba1c, BNP, TSH   Amanda Wilkins 01/22/2015, 10:36 PM

## 2015-01-22 NOTE — ED Notes (Signed)
The pt has had the flu since last Thursday.  Today she began to have an irregular rapid heart rate and she knew she was in af.  Hx of the same.  No pain some sob

## 2015-01-22 NOTE — H&P (Signed)
PCP:   [name], MD   Chief Complaint:  palpitations  HPI: 69 yo female h/o pafib, htn, comes in with 2 days of n/v, general malaise, diarrhea, and fever last night.  She started having palpitations today so came to ED.  Several people in her family have had similar illness.  Last night her temp was over 101.  Has been sob, but no cough.  Vomited several times, unable to hold down her home meds (is on cardizem).  No dysuria.  She has a lot of pain and very sensitive to her right breast/armpit/chest wall area.  No rashes.  She has not felt any lumps or bumps here but the general area is painful which is new.  No abd pain.  She has been given some ivf and bolus of 10mg  cardizem and her rate has improved from 140s to 110s.   cxr or ua has not been done yet.    Review of Systems:  Positive and negative as per HPI otherwise all other systems are negative  Past Medical History: Past Medical History  Diagnosis Date  . Allergy   . Atrial fibrillation   . Migraines     on Zoloft for migraines  . Hypertension   . Chronic interstitial nephritis   . Depression   . Palpitations   . Obesity   . Arthritis     ON BOTH KNEES   Past Surgical History  Procedure Laterality Date  . Cesarean section    . Dental surgery      multiple implants  . Repair of torn meniscus on left 08/2014 Left 08/2015    Dr. Ronnie Derby at [facility]    Medications: Prior to Admission medications   Medication Sig Start Date End Date Taking? Authorizing Provider  aspirin EC 325 MG tablet Take 325 mg by mouth daily.    Historical Provider, MD  atenolol (TENORMIN) 50 MG tablet TAKE 1 TABLET BY MOUTH DAILY 03/22/14   Aleksei Plotnikov V, MD  celecoxib (CELEBREX) 200 MG capsule Take 200 mg by mouth daily.    Historical Provider, MD  cholecalciferol (VITAMIN D) 1000 UNITS tablet Take 2,000 Units by mouth daily.     Historical Provider, MD  clarithromycin (BIAXIN XL) 500 MG 24 hr tablet Take 2 tablets (1,000 mg total) by mouth  daily. 01/04/15   Hendricks Limes, MD  DILT-XR 180 MG 24 hr capsule TAKE 1 CAPSULE BY MOUTH DAILY 08/05/14   Lew Dawes V, MD  estrogens, conjugated, (PREMARIN) 0.3 MG tablet Take daily Patient taking differently: Take 0.15 mg by mouth daily. Take daily 01/19/14   Terrance Mass, MD  fluocinonide cream (LIDEX) 5.00 % Apply 1 application topically 2 (two) times daily.    Historical Provider, MD  glycopyrrolate (ROBINUL) 1 MG tablet Take 1-2 mg by mouth 2 (two) times daily. Two tab in the morning and one tab at night    Historical Provider, MD  medroxyPROGESTERone (PROVERA) 10 MG tablet TAKE ONE TABLET 10 DAYS OF THE MONTH 01/19/14   Terrance Mass, MD  mupirocin ointment (BACTROBAN) 2 % Applied twice a day to the affected area;NOT into eyes. 01/04/15   Hendricks Limes, MD  nitrofurantoin, macrocrystal-monohydrate, (MACROBID) 100 MG capsule Take one twice a day for one week then take one after intercourse 12/06/14   Terrance Mass, MD  sertraline (ZOLOFT) 50 MG tablet Take 1 tablet (50 mg total) by mouth daily. 01/19/14   Terrance Mass, MD    Allergies:  Allergies  Allergen Reactions  . Epinephrine Other (See Comments)    Heart rate soars  . Aspirin     Can take the coated 325 mg. Plain asa 325 mg speeds up the heart.  [name] Epinephrine Base Other (See Comments)    Heart rate soars  . Penicillins Other (See Comments)    She took amoxicillin in 2012-2013 and was ok    Social History:  reports that she has never smoked. She has never used smokeless tobacco. She reports that she drinks about 8.4 oz of alcohol per week. She reports that she does not use illicit drugs.  Family History: Family History  Problem Relation Age of Onset  . Cancer Brother     PANCREATIC    Physical Exam: Filed Vitals:   01/22/15 2045 01/22/15 2115 01/22/15 2145 01/22/15 2200  BP: 101/50 105/50 111/68 119/50  Pulse: 103 82    Temp:      Resp: 31 23 18    SpO2: 98% 95%     General appearance: alert,  cooperative and no distress Head: Normocephalic, without obvious abnormality, atraumatic Eyes: negative Nose: Nares normal. Septum midline. Mucosa normal. No drainage or sinus tenderness. Neck: no JVD and supple, symmetrical, trachea midline Lungs: clear to auscultation bilaterally Breasts: Inspection negative, No nipple retraction or dimpling, No nipple discharge or bleeding, No axillary or supraclavicular adenopathy, Normal to palpation without dominant masses very tender and sensitive to general palpation right upper and lower outer quadrants of right breast into the rt axillary region, exam is o/w normal.  No rashes.  No signs for developing absess. Heart: irregularly irregular rhythm Abdomen: soft, non-tender; bowel sounds normal; no masses,  no organomegaly Extremities: extremities normal, atraumatic, no cyanosis or edema Pulses: 2+ and symmetric Skin: Skin color, texture, turgor normal. No rashes or lesions Neurologic: Grossly normal    Labs on Admission:   Recent Labs  01/22/15 1750  NA 127*  K 3.8  CL 94*  CO2 16*  GLUCOSE 156*  BUN 32*  CREATININE 1.97*  CALCIUM 8.6  MG 1.8  PHOS 3.3    Recent Labs  01/22/15 1750  WBC 9.5  HGB 14.9  HCT 42.3  MCV 98.1  PLT 148*   Radiological Exams on Admission: No results found.  Assessment/Plan  69 yo female with fever, n/v, acute renal failure with afib with rvr  Principal Problem:   Atrial fibrillation with RVR-  Will give her normal dose of cardizem.  Pt has not vomited since arrival to ED.  Cont ivf overnight.  Ck tsh.  If rate goes up again, consider dilt gtt but will do febrile w/u and treat any underlying causes if found.  Active Problems:   Upper respiratory disease recently-  Possible flu.  Ck flu.  Ck cxr.   Dehydration-  ivf   Breast pain, right-  Query if she'll develop a cellulitis in the next 24 hours or so.  No rashes at this time, but very sensitive and painful to this area.  Ck cpk levels.  Reexamine  this area again in the am.   Fever-  Would recommend checking cxr, and ua.  Treat infection if found.   Nausea & vomiting-  Ck lfts/lipase.  May be all viral related.  abd exam is benign.  obs on tele.  Full code.  Vladimir Lenhoff A 01/22/2015, 10:56 PM

## 2015-01-23 ENCOUNTER — Inpatient Hospital Stay (HOSPITAL_COMMUNITY): Payer: Medicare Other

## 2015-01-23 ENCOUNTER — Observation Stay (HOSPITAL_COMMUNITY): Payer: Medicare Other

## 2015-01-23 ENCOUNTER — Encounter (HOSPITAL_COMMUNITY): Payer: Self-pay | Admitting: *Deleted

## 2015-01-23 DIAGNOSIS — M6281 Muscle weakness (generalized): Secondary | ICD-10-CM | POA: Diagnosis not present

## 2015-01-23 DIAGNOSIS — K8000 Calculus of gallbladder with acute cholecystitis without obstruction: Secondary | ICD-10-CM | POA: Diagnosis not present

## 2015-01-23 DIAGNOSIS — E669 Obesity, unspecified: Secondary | ICD-10-CM | POA: Diagnosis present

## 2015-01-23 DIAGNOSIS — I481 Persistent atrial fibrillation: Secondary | ICD-10-CM | POA: Diagnosis present

## 2015-01-23 DIAGNOSIS — D649 Anemia, unspecified: Secondary | ICD-10-CM | POA: Diagnosis not present

## 2015-01-23 DIAGNOSIS — Z5189 Encounter for other specified aftercare: Secondary | ICD-10-CM | POA: Diagnosis not present

## 2015-01-23 DIAGNOSIS — I4891 Unspecified atrial fibrillation: Secondary | ICD-10-CM | POA: Diagnosis not present

## 2015-01-23 DIAGNOSIS — E872 Acidosis: Secondary | ICD-10-CM | POA: Diagnosis not present

## 2015-01-23 DIAGNOSIS — E876 Hypokalemia: Secondary | ICD-10-CM | POA: Diagnosis not present

## 2015-01-23 DIAGNOSIS — G4733 Obstructive sleep apnea (adult) (pediatric): Secondary | ICD-10-CM | POA: Diagnosis present

## 2015-01-23 DIAGNOSIS — M25519 Pain in unspecified shoulder: Secondary | ICD-10-CM | POA: Diagnosis not present

## 2015-01-23 DIAGNOSIS — N289 Disorder of kidney and ureter, unspecified: Secondary | ICD-10-CM | POA: Diagnosis not present

## 2015-01-23 DIAGNOSIS — N119 Chronic tubulo-interstitial nephritis, unspecified: Secondary | ICD-10-CM | POA: Diagnosis present

## 2015-01-23 DIAGNOSIS — L0390 Cellulitis, unspecified: Secondary | ICD-10-CM | POA: Diagnosis not present

## 2015-01-23 DIAGNOSIS — L03111 Cellulitis of right axilla: Secondary | ICD-10-CM | POA: Diagnosis present

## 2015-01-23 DIAGNOSIS — A427 Actinomycotic sepsis: Secondary | ICD-10-CM | POA: Diagnosis not present

## 2015-01-23 DIAGNOSIS — Z7982 Long term (current) use of aspirin: Secondary | ICD-10-CM | POA: Diagnosis not present

## 2015-01-23 DIAGNOSIS — M6088 Other myositis, other site: Secondary | ICD-10-CM | POA: Diagnosis not present

## 2015-01-23 DIAGNOSIS — A409 Streptococcal sepsis, unspecified: Secondary | ICD-10-CM | POA: Diagnosis not present

## 2015-01-23 DIAGNOSIS — J9600 Acute respiratory failure, unspecified whether with hypoxia or hypercapnia: Secondary | ICD-10-CM | POA: Diagnosis not present

## 2015-01-23 DIAGNOSIS — J069 Acute upper respiratory infection, unspecified: Secondary | ICD-10-CM | POA: Diagnosis not present

## 2015-01-23 DIAGNOSIS — R6521 Severe sepsis with septic shock: Secondary | ICD-10-CM | POA: Diagnosis not present

## 2015-01-23 DIAGNOSIS — D6489 Other specified anemias: Secondary | ICD-10-CM | POA: Diagnosis not present

## 2015-01-23 DIAGNOSIS — R0989 Other specified symptoms and signs involving the circulatory and respiratory systems: Secondary | ICD-10-CM | POA: Diagnosis not present

## 2015-01-23 DIAGNOSIS — I482 Chronic atrial fibrillation: Secondary | ICD-10-CM | POA: Diagnosis not present

## 2015-01-23 DIAGNOSIS — L03313 Cellulitis of chest wall: Secondary | ICD-10-CM | POA: Diagnosis present

## 2015-01-23 DIAGNOSIS — A419 Sepsis, unspecified organism: Secondary | ICD-10-CM | POA: Diagnosis present

## 2015-01-23 DIAGNOSIS — K8020 Calculus of gallbladder without cholecystitis without obstruction: Secondary | ICD-10-CM | POA: Diagnosis not present

## 2015-01-23 DIAGNOSIS — Z6841 Body Mass Index (BMI) 40.0 and over, adult: Secondary | ICD-10-CM | POA: Diagnosis not present

## 2015-01-23 DIAGNOSIS — Z452 Encounter for adjustment and management of vascular access device: Secondary | ICD-10-CM | POA: Diagnosis not present

## 2015-01-23 DIAGNOSIS — N172 Acute kidney failure with medullary necrosis: Secondary | ICD-10-CM | POA: Diagnosis not present

## 2015-01-23 DIAGNOSIS — K828 Other specified diseases of gallbladder: Secondary | ICD-10-CM | POA: Diagnosis not present

## 2015-01-23 DIAGNOSIS — R262 Difficulty in walking, not elsewhere classified: Secondary | ICD-10-CM | POA: Diagnosis not present

## 2015-01-23 DIAGNOSIS — R918 Other nonspecific abnormal finding of lung field: Secondary | ICD-10-CM | POA: Diagnosis not present

## 2015-01-23 DIAGNOSIS — R600 Localized edema: Secondary | ICD-10-CM | POA: Diagnosis not present

## 2015-01-23 DIAGNOSIS — M25511 Pain in right shoulder: Secondary | ICD-10-CM | POA: Diagnosis not present

## 2015-01-23 DIAGNOSIS — D65 Disseminated intravascular coagulation [defibrination syndrome]: Secondary | ICD-10-CM | POA: Diagnosis present

## 2015-01-23 DIAGNOSIS — I700 Atherosclerosis of aorta: Secondary | ICD-10-CM | POA: Diagnosis not present

## 2015-01-23 DIAGNOSIS — R112 Nausea with vomiting, unspecified: Secondary | ICD-10-CM | POA: Diagnosis not present

## 2015-01-23 DIAGNOSIS — D638 Anemia in other chronic diseases classified elsewhere: Secondary | ICD-10-CM | POA: Diagnosis not present

## 2015-01-23 DIAGNOSIS — K121 Other forms of stomatitis: Secondary | ICD-10-CM | POA: Diagnosis not present

## 2015-01-23 DIAGNOSIS — R0600 Dyspnea, unspecified: Secondary | ICD-10-CM | POA: Diagnosis not present

## 2015-01-23 DIAGNOSIS — R000 Tachycardia, unspecified: Secondary | ICD-10-CM | POA: Diagnosis not present

## 2015-01-23 DIAGNOSIS — R197 Diarrhea, unspecified: Secondary | ICD-10-CM | POA: Diagnosis not present

## 2015-01-23 DIAGNOSIS — J811 Chronic pulmonary edema: Secondary | ICD-10-CM | POA: Diagnosis not present

## 2015-01-23 DIAGNOSIS — B950 Streptococcus, group A, as the cause of diseases classified elsewhere: Secondary | ICD-10-CM | POA: Diagnosis not present

## 2015-01-23 DIAGNOSIS — J9601 Acute respiratory failure with hypoxia: Secondary | ICD-10-CM | POA: Diagnosis not present

## 2015-01-23 DIAGNOSIS — N179 Acute kidney failure, unspecified: Secondary | ICD-10-CM | POA: Diagnosis not present

## 2015-01-23 DIAGNOSIS — E1165 Type 2 diabetes mellitus with hyperglycemia: Secondary | ICD-10-CM | POA: Diagnosis present

## 2015-01-23 DIAGNOSIS — B007 Disseminated herpesviral disease: Secondary | ICD-10-CM | POA: Diagnosis not present

## 2015-01-23 DIAGNOSIS — G9340 Encephalopathy, unspecified: Secondary | ICD-10-CM | POA: Diagnosis not present

## 2015-01-23 DIAGNOSIS — E860 Dehydration: Secondary | ICD-10-CM | POA: Diagnosis not present

## 2015-01-23 DIAGNOSIS — J810 Acute pulmonary edema: Secondary | ICD-10-CM | POA: Diagnosis not present

## 2015-01-23 DIAGNOSIS — A400 Sepsis due to streptococcus, group A: Secondary | ICD-10-CM | POA: Diagnosis not present

## 2015-01-23 DIAGNOSIS — R062 Wheezing: Secondary | ICD-10-CM | POA: Diagnosis not present

## 2015-01-23 DIAGNOSIS — Z79899 Other long term (current) drug therapy: Secondary | ICD-10-CM | POA: Diagnosis not present

## 2015-01-23 DIAGNOSIS — R0602 Shortness of breath: Secondary | ICD-10-CM | POA: Diagnosis not present

## 2015-01-23 DIAGNOSIS — R0789 Other chest pain: Secondary | ICD-10-CM | POA: Diagnosis not present

## 2015-01-23 DIAGNOSIS — R509 Fever, unspecified: Secondary | ICD-10-CM | POA: Diagnosis not present

## 2015-01-23 DIAGNOSIS — B001 Herpesviral vesicular dermatitis: Secondary | ICD-10-CM | POA: Diagnosis not present

## 2015-01-23 DIAGNOSIS — I951 Orthostatic hypotension: Secondary | ICD-10-CM | POA: Diagnosis not present

## 2015-01-23 DIAGNOSIS — R1011 Right upper quadrant pain: Secondary | ICD-10-CM | POA: Diagnosis not present

## 2015-01-23 DIAGNOSIS — J9690 Respiratory failure, unspecified, unspecified whether with hypoxia or hypercapnia: Secondary | ICD-10-CM | POA: Diagnosis not present

## 2015-01-23 DIAGNOSIS — G43909 Migraine, unspecified, not intractable, without status migrainosus: Secondary | ICD-10-CM | POA: Diagnosis present

## 2015-01-23 DIAGNOSIS — R002 Palpitations: Secondary | ICD-10-CM | POA: Diagnosis not present

## 2015-01-23 DIAGNOSIS — R195 Other fecal abnormalities: Secondary | ICD-10-CM | POA: Diagnosis not present

## 2015-01-23 DIAGNOSIS — I480 Paroxysmal atrial fibrillation: Secondary | ICD-10-CM | POA: Diagnosis present

## 2015-01-23 DIAGNOSIS — I10 Essential (primary) hypertension: Secondary | ICD-10-CM | POA: Diagnosis present

## 2015-01-23 DIAGNOSIS — I2699 Other pulmonary embolism without acute cor pulmonale: Secondary | ICD-10-CM | POA: Diagnosis not present

## 2015-01-23 DIAGNOSIS — K811 Chronic cholecystitis: Secondary | ICD-10-CM | POA: Diagnosis not present

## 2015-01-23 DIAGNOSIS — J9811 Atelectasis: Secondary | ICD-10-CM | POA: Diagnosis not present

## 2015-01-23 DIAGNOSIS — E871 Hypo-osmolality and hyponatremia: Secondary | ICD-10-CM | POA: Diagnosis present

## 2015-01-23 DIAGNOSIS — L03311 Cellulitis of abdominal wall: Secondary | ICD-10-CM | POA: Diagnosis present

## 2015-01-23 DIAGNOSIS — J399 Disease of upper respiratory tract, unspecified: Secondary | ICD-10-CM | POA: Diagnosis not present

## 2015-01-23 DIAGNOSIS — F329 Major depressive disorder, single episode, unspecified: Secondary | ICD-10-CM | POA: Diagnosis present

## 2015-01-23 DIAGNOSIS — D6959 Other secondary thrombocytopenia: Secondary | ICD-10-CM | POA: Diagnosis not present

## 2015-01-23 DIAGNOSIS — R579 Shock, unspecified: Secondary | ICD-10-CM | POA: Diagnosis not present

## 2015-01-23 DIAGNOSIS — A46 Erysipelas: Secondary | ICD-10-CM | POA: Diagnosis not present

## 2015-01-23 LAB — URINALYSIS, ROUTINE W REFLEX MICROSCOPIC
Glucose, UA: NEGATIVE mg/dL
Ketones, ur: NEGATIVE mg/dL
Nitrite: NEGATIVE
PH: 5 (ref 5.0–8.0)
PROTEIN: 100 mg/dL — AB
SPECIFIC GRAVITY, URINE: 1.024 (ref 1.005–1.030)
Urobilinogen, UA: 1 mg/dL (ref 0.0–1.0)

## 2015-01-23 LAB — BASIC METABOLIC PANEL
Anion gap: 13 (ref 5–15)
BUN: 38 mg/dL — ABNORMAL HIGH (ref 6–23)
CO2: 13 mmol/L — ABNORMAL LOW (ref 19–32)
Calcium: 7.3 mg/dL — ABNORMAL LOW (ref 8.4–10.5)
Chloride: 101 mmol/L (ref 96–112)
Creatinine, Ser: 2.41 mg/dL — ABNORMAL HIGH (ref 0.50–1.10)
GFR calc Af Amer: 23 mL/min — ABNORMAL LOW (ref >90)
GFR calc non Af Amer: 20 mL/min — ABNORMAL LOW (ref >90)
Glucose, Bld: 116 mg/dL — ABNORMAL HIGH (ref 70–99)
Potassium: 3.9 mmol/L (ref 3.5–5.1)
Sodium: 127 mmol/L — ABNORMAL LOW (ref 135–145)

## 2015-01-23 LAB — URINE MICROSCOPIC-ADD ON

## 2015-01-23 LAB — INFLUENZA PANEL BY PCR (TYPE A & B)
H1N1 flu by pcr: NOT DETECTED
INFLBPCR: NEGATIVE
Influenza A By PCR: NEGATIVE

## 2015-01-23 LAB — LACTIC ACID, PLASMA: LACTIC ACID, VENOUS: 3.6 mmol/L — AB (ref 0.5–2.0)

## 2015-01-23 LAB — TROPONIN I
TROPONIN I: 0.03 ng/mL (ref <0.031)
Troponin I: <0.03 ng/mL (ref <0.031)

## 2015-01-23 LAB — TSH: TSH: 2.917 u[IU]/mL (ref 0.350–4.500)

## 2015-01-23 MED ORDER — OXYCODONE HCL 5 MG PO TABS
10.0000 mg | ORAL_TABLET | Freq: Once | ORAL | Status: AC
  Administered 2015-01-23: 10 mg via ORAL
  Filled 2015-01-23: qty 2

## 2015-01-23 MED ORDER — SODIUM CHLORIDE 0.9 % IJ SOLN
3.0000 mL | Freq: Two times a day (BID) | INTRAMUSCULAR | Status: DC
  Administered 2015-01-25 – 2015-01-30 (×9): 3 mL via INTRAVENOUS

## 2015-01-23 MED ORDER — SODIUM CHLORIDE 0.9 % IJ SOLN
10.0000 mL | INTRAMUSCULAR | Status: DC | PRN
  Administered 2015-01-31: 10 mL
  Administered 2015-02-04: 20 mL
  Administered 2015-02-04 (×2): 10 mL
  Administered 2015-02-04 – 2015-02-05 (×2): 20 mL
  Administered 2015-02-07: 10 mL
  Administered 2015-02-07: 20 mL
  Administered 2015-02-08 – 2015-02-09 (×2): 10 mL
  Filled 2015-01-23 (×10): qty 40

## 2015-01-23 MED ORDER — PIPERACILLIN-TAZOBACTAM 3.375 G IVPB
3.3750 g | Freq: Three times a day (TID) | INTRAVENOUS | Status: DC
  Administered 2015-01-23 – 2015-01-24 (×2): 3.375 g via INTRAVENOUS
  Filled 2015-01-23 (×5): qty 50

## 2015-01-23 MED ORDER — CELECOXIB 200 MG PO CAPS
200.0000 mg | ORAL_CAPSULE | Freq: Every day | ORAL | Status: DC
  Filled 2015-01-23: qty 1

## 2015-01-23 MED ORDER — ESTROGENS CONJUGATED 0.3 MG PO TABS
0.3000 mg | ORAL_TABLET | ORAL | Status: DC
  Administered 2015-01-23 – 2015-02-08 (×9): 0.3 mg via ORAL
  Filled 2015-01-23 (×9): qty 1

## 2015-01-23 MED ORDER — SERTRALINE HCL 50 MG PO TABS
50.0000 mg | ORAL_TABLET | Freq: Every day | ORAL | Status: DC
  Administered 2015-01-23 – 2015-02-09 (×18): 50 mg via ORAL
  Filled 2015-01-23 (×18): qty 1

## 2015-01-23 MED ORDER — SODIUM CHLORIDE 0.9 % IV SOLN
INTRAVENOUS | Status: DC
  Administered 2015-01-24 (×2): 75 mL/h via INTRAVENOUS

## 2015-01-23 MED ORDER — DILTIAZEM HCL 100 MG IV SOLR
5.0000 mg/h | INTRAVENOUS | Status: DC
  Administered 2015-01-23: 10 mg/h via INTRAVENOUS
  Administered 2015-01-23 – 2015-01-24 (×3): 5 mg/h via INTRAVENOUS
  Administered 2015-01-24: 10 mg/h via INTRAVENOUS
  Filled 2015-01-23 (×8): qty 100

## 2015-01-23 MED ORDER — ENOXAPARIN SODIUM 30 MG/0.3ML ~~LOC~~ SOLN
30.0000 mg | SUBCUTANEOUS | Status: DC
  Administered 2015-01-24 – 2015-01-25 (×2): 30 mg via SUBCUTANEOUS
  Filled 2015-01-23 (×2): qty 0.3

## 2015-01-23 MED ORDER — SODIUM CHLORIDE 0.9 % IV BOLUS (SEPSIS)
500.0000 mL | Freq: Once | INTRAVENOUS | Status: AC
  Administered 2015-01-23: 500 mL via INTRAVENOUS

## 2015-01-23 MED ORDER — ENOXAPARIN SODIUM 40 MG/0.4ML ~~LOC~~ SOLN
40.0000 mg | SUBCUTANEOUS | Status: DC
  Administered 2015-01-23: 40 mg via SUBCUTANEOUS
  Filled 2015-01-23 (×2): qty 0.4

## 2015-01-23 MED ORDER — MEDROXYPROGESTERONE ACETATE 10 MG PO TABS
10.0000 mg | ORAL_TABLET | Freq: Once | ORAL | Status: AC
Start: 2015-01-23 — End: 2015-01-23
  Administered 2015-01-23: 10 mg via ORAL
  Filled 2015-01-23: qty 1

## 2015-01-23 MED ORDER — ASPIRIN EC 325 MG PO TBEC
325.0000 mg | DELAYED_RELEASE_TABLET | Freq: Every day | ORAL | Status: DC
  Administered 2015-01-23 – 2015-01-27 (×5): 325 mg via ORAL
  Filled 2015-01-23 (×8): qty 1

## 2015-01-23 MED ORDER — ONDANSETRON HCL 4 MG/2ML IJ SOLN: 4.0000 mg | Freq: Four times a day (QID) | INTRAMUSCULAR | Status: DC | PRN

## 2015-01-23 MED ORDER — ACETAMINOPHEN 650 MG RE SUPP: 650.0000 mg | Freq: Four times a day (QID) | RECTAL | Status: DC | PRN

## 2015-01-23 MED ORDER — ONDANSETRON HCL 4 MG PO TABS: 4.0000 mg | ORAL_TABLET | Freq: Four times a day (QID) | ORAL | Status: DC | PRN

## 2015-01-23 MED ORDER — OSELTAMIVIR PHOSPHATE 30 MG PO CAPS
30.0000 mg | ORAL_CAPSULE | Freq: Two times a day (BID) | ORAL | Status: DC
  Administered 2015-01-23: 30 mg via ORAL
  Filled 2015-01-23 (×2): qty 1

## 2015-01-23 MED ORDER — ACETAMINOPHEN 325 MG PO TABS
650.0000 mg | ORAL_TABLET | Freq: Four times a day (QID) | ORAL | Status: DC | PRN
  Administered 2015-01-23: 650 mg via ORAL
  Filled 2015-01-23: qty 2

## 2015-01-23 MED ORDER — VANCOMYCIN HCL IN DEXTROSE 1-5 GM/200ML-% IV SOLN: 1000.0000 mg | INTRAVENOUS | Status: DC

## 2015-01-23 MED ORDER — ATENOLOL 50 MG PO TABS
50.0000 mg | ORAL_TABLET | Freq: Every day | ORAL | Status: DC
  Administered 2015-01-24: 50 mg via ORAL
  Filled 2015-01-23 (×3): qty 1

## 2015-01-23 MED ORDER — LORAZEPAM 0.5 MG PO TABS
1.0000 mg | ORAL_TABLET | Freq: Once | ORAL | Status: DC
  Filled 2015-01-23: qty 1

## 2015-01-23 MED ORDER — VANCOMYCIN HCL 10 G IV SOLR
2000.0000 mg | Freq: Once | INTRAVENOUS | Status: AC
  Administered 2015-01-23: 2000 mg via INTRAVENOUS
  Filled 2015-01-23 (×2): qty 2000

## 2015-01-23 MED ORDER — PIPERACILLIN-TAZOBACTAM 3.375 G IVPB 30 MIN
3.3750 g | Freq: Once | INTRAVENOUS | Status: AC
  Administered 2015-01-23: 3.375 g via INTRAVENOUS
  Filled 2015-01-23: qty 50

## 2015-01-23 MED ORDER — DILTIAZEM HCL ER 180 MG PO CP24
180.0000 mg | ORAL_CAPSULE | Freq: Every day | ORAL | Status: DC
  Filled 2015-01-23: qty 1

## 2015-01-23 MED ORDER — VANCOMYCIN HCL 10 G IV SOLR
1500.0000 mg | INTRAVENOUS | Status: DC
  Filled 2015-01-23: qty 1500

## 2015-01-23 NOTE — Progress Notes (Addendum)
Patient Name: Amanda Wilkins Date of Encounter: 01/23/2015  Principal Problem:   Atrial fibrillation with RVR Active Problems:   Palpitations   Upper respiratory disease   Dehydration   Breast pain, right   A-fib   Fever   Nausea & vomiting   Sepsis   Length of Stay:   SUBJECTIVE  The patient feels weak and tired today, no SOB or chest pain.   CURRENT MEDS . aspirin EC  325 mg Oral Daily  . atenolol  50 mg Oral Daily  . enoxaparin (LOVENOX) injection  40 mg Subcutaneous Q24H  . oseltamivir  30 mg Oral BID  . piperacillin-tazobactam  3.375 g Intravenous Once  . piperacillin-tazobactam (ZOSYN)  IV  3.375 g Intravenous Q8H  . sertraline  50 mg Oral Daily  . sodium chloride  3 mL Intravenous Q12H  . vancomycin  2,000 mg Intravenous Once  . [START ON 01/24/2015] vancomycin  1,000 mg Intravenous Q24H   . sodium chloride 100 mL/hr at 01/23/15 0939  . diltiazem (CARDIZEM) infusion 10 mg/hr (01/23/15 0342)   OBJECTIVE  Filed Vitals:   01/23/15 0426 01/23/15 0450 01/23/15 0707 01/23/15 0804  BP: 97/44 108/39 90/41 92/77   Pulse: 109 107 97 133  Temp:  100.7 F (38.2 C) 103 F (39.4 C) 99.3 F (37.4 C)  TempSrc:  Oral Oral Oral  Resp:    24  Height:      Weight:      SpO2:  95%      Intake/Output Summary (Last 24 hours) at 01/23/15 1026 Last data filed at 01/23/15 0825  Gross per 24 hour  Intake   3570 ml  Output      1 ml  Net   3569 ml   Filed Weights   01/23/15 0022 01/23/15 0400  Weight: 232 lb 12.8 oz (105.597 kg) 232 lb 12.8 oz (105.597 kg)    PHYSICAL EXAM  General: Pleasant, NAD. Neuro: Alert and oriented X 3. Moves all extremities spontaneously. Psych: Normal affect. HEENT:  Normal  Neck: Supple without bruits or JVD. Lungs:  Resp regular and unlabored, CTA. Heart: Irreg irregular, no s3, s4, or murmurs. Abdomen: Soft, non-tender, non-distended, BS + x 4.  Extremities: No clubbing, cyanosis or edema. DP/PT/Radials 2+ and equal  bilaterally.  Accessory Clinical Findings  CBC  Recent Labs  01/22/15 1750  WBC 9.5  HGB 14.9  HCT 42.3  MCV 98.1  PLT 425*   Basic Metabolic Panel  Recent Labs  01/22/15 1750  NA 127*  K 3.8  CL 94*  CO2 16*  GLUCOSE 156*  BUN 32*  CREATININE 1.97*  CALCIUM 8.6  MG 1.8  PHOS 3.3   Radiology/Studies  Dg Chest 2 View  01/22/2015   CLINICAL DATA:  Atrial fibrillation. Shortness of breath with exertion. Right axillary pain. No injury.  EXAM: CHEST  2 VIEW  COMPARISON:  09/22/2014  FINDINGS: Normal heart size and pulmonary vascularity. Perihilar interstitial changes suggesting chronic bronchitis. Linear atelectasis or scarring in the lung bases. No focal consolidation. No blunting of costophrenic angles. No pneumothorax. Mediastinal contours appear intact. Calcified aorta. Mild degenerative changes in the spine.  IMPRESSION: Chronic bronchitic changes in the lungs with slight fibrosis or linear atelectasis in the lung bases. No focal consolidation.   Electronically Signed   By: Lucienne Capers M.D.   On: 01/22/2015 23:46   TELE: A-fib with RVR   ASSESSMENT AND PLAN  Atrial fibrillation + RVR Acute illness/infection Hypovolemic Hyponatremia Acute renal  failure  Abnormal nuclear stress test   [name] is a 69 yo woman with PMH of paroxysmal atrial fibrillation on high dose aspirin only, 4 episodes in the past that always cardioverted on their own.  She was admitted with an acute URI and right armpit infection and was found to be in a-fib with RVR, hypovolemic, hyponatremic with an acute renal failure.  A-fib clearly triggered by an acute infectious disease and fever.   She continues to spike fever and is hypotensive, at this point I I would continue aggressive therapy for underlying infectious disease and continue aggressive volume ersuscitation. I assume HR will slow down and a-fib will cardiovert spontaneously once her acute issues are resolved.  We  will follow, not plan for any DCCV at this point.   Cardiac enzymes are negative.  Signed, Dorothy Spark MD, [facility] 01/23/2015

## 2015-01-23 NOTE — Progress Notes (Signed)
CRITICAL VALUE ALERT  Critical value received: Lactic Acid 3.6  Date of notification:  01/23/2015  Time of notification: 8719  Critical value read back: yes  Nurse who received alert: Alfredo Bach  MD notified (1st page):  Dr. Broadus John  Time of first page:  1457  MD notified (2nd page):  Time of second page:  Responding MD:  Dr. Broadus John  Time MD responded:  [phone]

## 2015-01-23 NOTE — Progress Notes (Signed)
Notified on-call physician, K. Schorr, of critical lab, lactic acid 3.3; trending down from 3.6 earlier this afternoon.

## 2015-01-23 NOTE — Progress Notes (Signed)
Peripherally Inserted Central Catheter/Midline Placement  The IV Nurse has discussed with the patient and/or persons authorized to consent for the patient, the purpose of this procedure and the potential benefits and risks involved with this procedure.  The benefits include less needle sticks, lab draws from the catheter and patient may be discharged home with the catheter.  Risks include, but not limited to, infection, bleeding, blood clot (thrombus formation), and puncture of an artery; nerve damage and irregular heat beat.  Alternatives to this procedure were also discussed.  PICC/Midline Placement Documentation  PICC / Midline Double Lumen 01/23/15 PICC Left Brachial 42 cm 0 cm (Active)  Indication for Insertion or Continuance of Line Poor Vasculature-patient has had multiple peripheral attempts or PIVs lasting less than 24 hours 01/23/2015  4:00 PM  Exposed Catheter (cm) 0 cm 01/23/2015  4:00 PM  Dressing Change Due 01/30/15 01/23/2015  4:00 PM       Jule Economy Horton 01/23/2015, 4:53 PM

## 2015-01-23 NOTE — Progress Notes (Signed)
RN unable to administer zosyn at the scheduled time due to meds not being compatible with current IV drip of Cardizem.  Cardizem wasn't able to be discontinued just to administer zosyn due to patient's heart rate remaining about 115 and in A-fib.  IV team asked to assess patient for a 2nd site but were unable to do so due to not being able to thread the needle for IV and recommended PICC be inserted.  RN went ahead and hung vancomycin because it was compatible with Cardizem drip.  RN will notify MD to see if cardizem can be changed to something possibly compatible with abx since HR is still not controlled.

## 2015-01-23 NOTE — Progress Notes (Signed)
UR completed 

## 2015-01-23 NOTE — Progress Notes (Addendum)
ANTIBIOTIC CONSULT NOTE - INITIAL  Pharmacy Consult for vancomycin/zosyn Indication: Sepsis  Allergies  Allergen Reactions  . Epinephrine Other (See Comments)    Heart rate soars  . Aspirin     Can take the coated 325 mg. Plain asa 325 mg speeds up the heart.  [name] Epinephrine Base Other (See Comments)    Heart rate soars  . Penicillins Other (See Comments)    She took amoxicillin in 2012-2013 and was ok    Patient Measurements: Height: 5\' 6"  (167.6 cm) Weight: 232 lb 12.8 oz (105.597 kg) IBW/kg (Calculated) : 59.3  Vital Signs: Temp: 99.3 F (37.4 C) (04/10 0804) Temp Source: Oral (04/10 0804) BP: 92/77 mmHg (04/10 0804) Pulse Rate: 133 (04/10 0804) Intake/Output from previous day: 04/09 0701 - 04/10 0700 In: 3450 [I.V.:3450] Out: 1 [Stool:1] Intake/Output from this shift: Total I/O In: 120 [P.O.:120] Out: -   Labs:  Recent Labs  01/22/15 1750  WBC 9.5  HGB 14.9  PLT 148*  CREATININE 1.97*   Estimated Creatinine Clearance: 33.6 mL/min (by C-G formula based on Cr of 1.97). No results for input(s): VANCOTROUGH, VANCOPEAK, VANCORANDOM, GENTTROUGH, GENTPEAK, GENTRANDOM, TOBRATROUGH, TOBRAPEAK, TOBRARND, AMIKACINPEAK, AMIKACINTROU, AMIKACIN in the last 72 hours.   Microbiology: No results found for this or any previous visit (from the past 720 hour(s)).  Medical History: Past Medical History  Diagnosis Date  . Allergy   . Atrial fibrillation   . Migraines     on Zoloft for migraines  . Hypertension   . Chronic interstitial nephritis   . Depression   . Palpitations   . Obesity   . Arthritis     ON BOTH KNEES    Medications:  Prescriptions prior to admission  Medication Sig Dispense Refill Last Dose  . aspirin EC 325 MG tablet Take 325 mg by mouth daily.   Taking  . atenolol (TENORMIN) 50 MG tablet TAKE 1 TABLET BY MOUTH DAILY 90 tablet 3 Taking  . celecoxib (CELEBREX) 200 MG capsule Take 200 mg by mouth daily.   Taking  . cholecalciferol (VITAMIN D)  1000 UNITS tablet Take 2,000 Units by mouth daily.    Taking  . clarithromycin (BIAXIN XL) 500 MG 24 hr tablet Take 2 tablets (1,000 mg total) by mouth daily. 14 tablet 0   . DILT-XR 180 MG 24 hr capsule TAKE 1 CAPSULE BY MOUTH DAILY 90 capsule 3 Taking  . estrogens, conjugated, (PREMARIN) 0.3 MG tablet Take daily (Patient taking differently: Take 0.15 mg by mouth daily. Take daily) 30 tablet 11 Taking  . fluocinonide cream (LIDEX) 3.82 % Apply 1 application topically 2 (two) times daily.   Taking  . glycopyrrolate (ROBINUL) 1 MG tablet Take 1-2 mg by mouth 2 (two) times daily. Two tab in the morning and one tab at night   Taking  . medroxyPROGESTERone (PROVERA) 10 MG tablet TAKE ONE TABLET 10 DAYS OF THE MONTH 30 tablet 4 Taking  . mupirocin ointment (BACTROBAN) 2 % Applied twice a day to the affected area;NOT into eyes. 15 g 0   . nitrofurantoin, macrocrystal-monohydrate, (MACROBID) 100 MG capsule Take one twice a day for one week then take one after intercourse 14 capsule 5 Taking  . sertraline (ZOLOFT) 50 MG tablet Take 1 tablet (50 mg total) by mouth daily. 90 tablet 3 Taking   Scheduled:  . aspirin EC  325 mg Oral Daily  . atenolol  50 mg Oral Daily  . enoxaparin (LOVENOX) injection  40 mg Subcutaneous Q24H  .  oseltamivir  30 mg Oral BID  . sertraline  50 mg Oral Daily  . sodium chloride  3 mL Intravenous Q12H    Assessment: 69 yo female here with n/v/d and recent fever to begin zosyn and vancomycin for possible sepsis. WBC= 9.5, tmax= 103, SCr= 1.97 (SCr noted 0.8 on 11/11/14) and CrCl ~ 30.   4/10 zosyn>> 4/10 vanc>>  4/10 blood x2  Goal of Therapy:  Vancomycin trough level 15-20 mcg/ml  Plan:  -Zosyn 3.375gm IV q8h -Vancomycin 2000mg  IV x1 followed by 1250mg  IV q24h -Will follow renal function, cultures and clinical progress  Hildred Laser, Pharm D 01/23/2015 8:43 AM   Addendum: Renal function has worsened with SCr up to 2.41. Normalized CrCl ~25 ml/min.  Change  vancomycin to 1500 mg IV q48h - next 4/12 Resumed hormone therapy per order from Dr. Broadus John; unsure how patient splits Premarin 0.3 mg tab so ordered 0.3 mg q48h instead  [facility], [place].D., BCPS Clinical Pharmacist Pager: [phone] 01/23/2015 2:20 PM

## 2015-01-23 NOTE — Progress Notes (Addendum)
[facility] PROGRESS NOTE  Amanda Wilkins VHQ:[id number] DOB: 1946-07-12 DOA: 01/22/2015 PCP: Sonda Primes, MD  Assessment/Plan: 1. Sepsis -r/o Flu, UTI also possible -increase NS to 100cc/hr -add tamiflu, start Vanc and Zosyn -check lactic acid -also check Ct chest wall/axilla due to area of tenderness and induration -FU urine Cx , Blood cx and Flu PCR  2. Afib with RVR -due to 1 -IVF, now on cardizem gtt, also on Atenolol as BP tolerated -on ASA, not on anticoagulation, Dr.kelly recommended heparin gtt, will start this pending CT chest wall  3. AKI -due to 1, stop NSAID -continue IVF  4. Suspected CAD -recent abnormal Stress test -continue ASA, Atenolol as BP tolerates -cards following  DVT proph: lovenox  Code Status: Full Code Family Communication: none at bedside Disposition Plan: inpatient, may Tx to SDU if deteriorates   Consultants:  Cards  HPI/Subjective: Feels sick, fevers overnight  Objective: Filed Vitals:   01/23/15 0707  BP: 90/41  Pulse: 97  Temp: 103 F (39.4 C)  Resp:     Intake/Output Summary (Last 24 hours) at 01/23/15 0803 Last data filed at 01/22/15 2250  Gross per 24 hour  Intake   3000 ml  Output      0 ml  Net   3000 ml   Filed Weights   01/23/15 0022 01/23/15 0400  Weight: 105.597 kg (232 lb 12.8 oz) 105.597 kg (232 lb 12.8 oz)    Exam:   General: ill appearing, uncomfortable  Cardiovascular: S1S2/irregular rate and rhythm  Respiratory: poor air movt B/l  Abdomen: soft, obese, NT, BS present  Musculoskeletal: no edema c/c  R axilla/chest wall/breast with tenderness and small area of induration which is very tender, no erythema noted  Data Reviewed: Basic Metabolic Panel:  Recent Labs Lab 01/22/15 1750  NA 127*  K 3.8  CL 94*  CO2 16*  GLUCOSE 156*  BUN 32*  CREATININE 1.97*  CALCIUM 8.6  MG 1.8  PHOS 3.3   Liver Function Tests: No results for input(s): AST, ALT, ALKPHOS, BILITOT, PROT,  ALBUMIN in the last 168 hours. No results for input(s): LIPASE, AMYLASE in the last 168 hours. No results for input(s): AMMONIA in the last 168 hours. CBC:  Recent Labs Lab 01/22/15 1750  WBC 9.5  HGB 14.9  HCT 42.3  MCV 98.1  PLT 148*   Cardiac Enzymes: No results for input(s): CKTOTAL, CKMB, CKMBINDEX, TROPONINI in the last 168 hours. BNP (last 3 results) No results for input(s): BNP in the last 8760 hours.  ProBNP (last 3 results)  Recent Labs  09/22/14 1755 11/11/14 0943  PROBNP 479.7* 173.0*    CBG: No results for input(s): GLUCAP in the last 168 hours.  No results found for this or any previous visit (from the past 240 hour(s)).   Studies: Dg Chest 2 View  01/22/2015   CLINICAL DATA:  Atrial fibrillation. Shortness of breath with exertion. Right axillary pain. No injury.  EXAM: CHEST  2 VIEW  COMPARISON:  09/22/2014  FINDINGS: Normal heart size and pulmonary vascularity. Perihilar interstitial changes suggesting chronic bronchitis. Linear atelectasis or scarring in the lung bases. No focal consolidation. No blunting of costophrenic angles. No pneumothorax. Mediastinal contours appear intact. Calcified aorta. Mild degenerative changes in the spine.  IMPRESSION: Chronic bronchitic changes in the lungs with slight fibrosis or linear atelectasis in the lung bases. No focal consolidation.   Electronically Signed   By: Burman Nieves M.D.   On: 01/22/2015 23:46    Scheduled  Meds: . aspirin EC  325 mg Oral Daily  . atenolol  50 mg Oral Daily  . enoxaparin (LOVENOX) injection  40 mg Subcutaneous Q24H  . oseltamivir  75 mg Oral BID  . sertraline  50 mg Oral Daily  . sodium chloride  3 mL Intravenous Q12H   Continuous Infusions: . sodium chloride 75 mL/hr at 01/22/15 2349  . diltiazem (CARDIZEM) infusion 10 mg/hr (01/23/15 0342)   Antibiotics Given (last 72 hours)    None      Principal Problem:   Atrial fibrillation with RVR Active Problems:   Palpitations    Upper respiratory disease   Dehydration   Breast pain, right   A-fib   Fever   Nausea & vomiting   Sepsis    Time spent:   [facility]  [facility] Pager [phone]. If 7PM-7AM, please contact night-coverage at [email or web address], password [facility] 01/23/2015, 8:03 AM

## 2015-01-24 ENCOUNTER — Inpatient Hospital Stay (HOSPITAL_COMMUNITY): Payer: Medicare Other

## 2015-01-24 DIAGNOSIS — R000 Tachycardia, unspecified: Secondary | ICD-10-CM

## 2015-01-24 DIAGNOSIS — R0602 Shortness of breath: Secondary | ICD-10-CM

## 2015-01-24 DIAGNOSIS — R740 Nonspecific elevation of levels of transaminase and lactic acid dehydrogenase [LDH]: Secondary | ICD-10-CM

## 2015-01-24 DIAGNOSIS — R112 Nausea with vomiting, unspecified: Secondary | ICD-10-CM

## 2015-01-24 DIAGNOSIS — N179 Acute kidney failure, unspecified: Secondary | ICD-10-CM

## 2015-01-24 DIAGNOSIS — M79629 Pain in unspecified upper arm: Secondary | ICD-10-CM | POA: Diagnosis present

## 2015-01-24 DIAGNOSIS — R0600 Dyspnea, unspecified: Secondary | ICD-10-CM | POA: Diagnosis present

## 2015-01-24 DIAGNOSIS — R7401 Elevation of levels of liver transaminase levels: Secondary | ICD-10-CM | POA: Diagnosis present

## 2015-01-24 DIAGNOSIS — E872 Acidosis: Secondary | ICD-10-CM

## 2015-01-24 DIAGNOSIS — R509 Fever, unspecified: Secondary | ICD-10-CM

## 2015-01-24 DIAGNOSIS — E871 Hypo-osmolality and hyponatremia: Secondary | ICD-10-CM

## 2015-01-24 DIAGNOSIS — N289 Disorder of kidney and ureter, unspecified: Secondary | ICD-10-CM

## 2015-01-24 DIAGNOSIS — R0902 Hypoxemia: Secondary | ICD-10-CM | POA: Diagnosis present

## 2015-01-24 DIAGNOSIS — D696 Thrombocytopenia, unspecified: Secondary | ICD-10-CM | POA: Diagnosis present

## 2015-01-24 DIAGNOSIS — R109 Unspecified abdominal pain: Secondary | ICD-10-CM | POA: Diagnosis present

## 2015-01-24 DIAGNOSIS — R197 Diarrhea, unspecified: Secondary | ICD-10-CM

## 2015-01-24 LAB — GLUCOSE, CAPILLARY: Glucose-Capillary: 89 mg/dL (ref 70–99)

## 2015-01-24 LAB — CBC WITH DIFFERENTIAL/PLATELET
BASOS ABS: 0 10*3/uL (ref 0.0–0.1)
Basophils Relative: 0 % (ref 0–1)
EOS PCT: 1 % (ref 0–5)
Eosinophils Absolute: 0.1 10*3/uL (ref 0.0–0.7)
HCT: 32.4 % — ABNORMAL LOW (ref 36.0–46.0)
Hemoglobin: 11.6 g/dL — ABNORMAL LOW (ref 12.0–15.0)
LYMPHS ABS: 0.5 10*3/uL — AB (ref 0.7–4.0)
LYMPHS PCT: 5 % — AB (ref 12–46)
MCH: 33.8 pg (ref 26.0–34.0)
MCHC: 35.8 g/dL (ref 30.0–36.0)
MCV: 94.5 fL (ref 78.0–100.0)
MONOS PCT: 4 % (ref 3–12)
Monocytes Absolute: 0.4 10*3/uL (ref 0.1–1.0)
NEUTROS PCT: 90 % — AB (ref 43–77)
Neutro Abs: 8 10*3/uL — ABNORMAL HIGH (ref 1.7–7.7)
PLATELETS: 73 10*3/uL — AB (ref 150–400)
RBC: 3.43 MIL/uL — AB (ref 3.87–5.11)
RDW: 13.6 % (ref 11.5–15.5)
WBC: 9 10*3/uL (ref 4.0–10.5)

## 2015-01-24 LAB — COMPREHENSIVE METABOLIC PANEL
ALBUMIN: 2.2 g/dL — AB (ref 3.5–5.2)
ALK PHOS: 38 U/L — AB (ref 39–117)
ALT: 39 U/L — ABNORMAL HIGH (ref 0–35)
AST: 54 U/L — AB (ref 0–37)
Anion gap: 7 (ref 5–15)
BILIRUBIN TOTAL: 1.4 mg/dL — AB (ref 0.3–1.2)
BUN: 52 mg/dL — AB (ref 6–23)
CHLORIDE: 100 mmol/L (ref 96–112)
CO2: 18 mmol/L — ABNORMAL LOW (ref 19–32)
Calcium: 7 mg/dL — ABNORMAL LOW (ref 8.4–10.5)
Creatinine, Ser: 3.25 mg/dL — ABNORMAL HIGH (ref 0.50–1.10)
GFR calc Af Amer: 16 mL/min — ABNORMAL LOW (ref >90)
GFR, EST NON AFRICAN AMERICAN: 14 mL/min — AB (ref >90)
Glucose, Bld: 107 mg/dL — ABNORMAL HIGH (ref 70–99)
POTASSIUM: 3.8 mmol/L (ref 3.5–5.1)
Sodium: 125 mmol/L — ABNORMAL LOW (ref 135–145)
Total Protein: 5 g/dL — ABNORMAL LOW (ref 6.0–8.3)

## 2015-01-24 LAB — DIC (DISSEMINATED INTRAVASCULAR COAGULATION)PANEL
INR: 1.18 (ref 0.00–1.49)
Smear Review: NONE SEEN
aPTT: 35 seconds (ref 24–37)

## 2015-01-24 LAB — BLOOD GAS, ARTERIAL
Acid-base deficit: 12.5 mmol/L — ABNORMAL HIGH (ref 0.0–2.0)
Acid-base deficit: 14.3 mmol/L — ABNORMAL HIGH (ref 0.0–2.0)
BICARBONATE: 11.6 meq/L — AB (ref 20.0–24.0)
BICARBONATE: 10.9 meq/L — AB (ref 20.0–24.0)
Drawn by: 27022
Drawn by: 252031
FIO2: 1 %
O2 CONTENT: 3.5 L/min
O2 Saturation: 96.2 %
O2 Saturation: 97.9 %
PATIENT TEMPERATURE: 98.6
PCO2 ART: 19.9 mmHg — AB (ref 35.0–45.0)
PH ART: 7.386 (ref 7.350–7.450)
PH ART: 7.3 — AB (ref 7.350–7.450)
PO2 ART: 137 mmHg — AB (ref 80.0–100.0)
Patient temperature: 98.6
TCO2: 12.3 mmol/L (ref 0–100)
TCO2: 11.6 mmol/L (ref 0–100)
pCO2 arterial: 22.8 mmHg — ABNORMAL LOW (ref 35.0–45.0)
pO2, Arterial: 90.8 mmHg (ref 80.0–100.0)

## 2015-01-24 LAB — DIC (DISSEMINATED INTRAVASCULAR COAGULATION) PANEL
D DIMER QUANT: 8.22 ug{FEU}/mL — AB (ref 0.00–0.48)
FIBRINOGEN: 624 mg/dL — AB (ref 204–475)
PLATELETS: 74 10*3/uL — AB (ref 150–400)
PROTHROMBIN TIME: 15.1 s (ref 11.6–15.2)

## 2015-01-24 LAB — CLOSTRIDIUM DIFFICILE BY PCR: CDIFFPCR: NEGATIVE

## 2015-01-24 LAB — MRSA PCR SCREENING: MRSA BY PCR: NEGATIVE

## 2015-01-24 LAB — LACTIC ACID, PLASMA
LACTIC ACID, VENOUS: 3.3 mmol/L — AB (ref 0.5–2.0)
Lactic Acid, Venous: 1.4 mmol/L (ref 0.5–2.0)
Lactic Acid, Venous: 1.3 mmol/L (ref 0.5–2.0)

## 2015-01-24 LAB — CBC
HCT: 33.4 % — ABNORMAL LOW (ref 36.0–46.0)
HEMOGLOBIN: 11.8 g/dL — AB (ref 12.0–15.0)
MCH: 33.7 pg (ref 26.0–34.0)
MCHC: 35.3 g/dL (ref 30.0–36.0)
MCV: 95.4 fL (ref 78.0–100.0)
Platelets: 84 10*3/uL — ABNORMAL LOW (ref 150–400)
RBC: 3.5 MIL/uL — ABNORMAL LOW (ref 3.87–5.11)
RDW: 13.6 % (ref 11.5–15.5)
WBC: 6.3 10*3/uL (ref 4.0–10.5)

## 2015-01-24 LAB — PROCALCITONIN: Procalcitonin: 48.23 ng/mL

## 2015-01-24 MED ORDER — DOPAMINE-DEXTROSE 3.2-5 MG/ML-% IV SOLN
INTRAVENOUS | Status: AC
  Administered 2015-01-24: 800 mg
  Filled 2015-01-24: qty 250

## 2015-01-24 MED ORDER — GUAIFENESIN 100 MG/5ML PO SYRP
200.0000 mg | ORAL_SOLUTION | ORAL | Status: DC | PRN
  Administered 2015-01-26 – 2015-01-29 (×4): 200 mg via ORAL
  Filled 2015-01-24 (×8): qty 10

## 2015-01-24 MED ORDER — IOHEXOL 300 MG/ML  SOLN
25.0000 mL | INTRAMUSCULAR | Status: AC
  Administered 2015-01-24 (×2): 25 mL via ORAL

## 2015-01-24 MED ORDER — DOPAMINE-DEXTROSE 3.2-5 MG/ML-% IV SOLN
0.0000 ug/kg/min | INTRAVENOUS | Status: DC
  Administered 2015-01-24: 0.3 ug/kg/min via INTRAVENOUS

## 2015-01-24 MED ORDER — PIPERACILLIN-TAZOBACTAM IN DEX 2-0.25 GM/50ML IV SOLN
2.2500 g | Freq: Four times a day (QID) | INTRAVENOUS | Status: DC
  Administered 2015-01-24 – 2015-01-26 (×7): 2.25 g via INTRAVENOUS
  Filled 2015-01-24 (×14): qty 50

## 2015-01-24 MED ORDER — NALOXONE HCL 0.4 MG/ML IJ SOLN: 0.4000 mg | Freq: Once | INTRAMUSCULAR | Status: DC

## 2015-01-24 MED ORDER — LOPERAMIDE HCL 2 MG PO CAPS
2.0000 mg | ORAL_CAPSULE | Freq: Three times a day (TID) | ORAL | Status: DC | PRN
  Administered 2015-01-24: 2 mg via ORAL
  Filled 2015-01-24 (×2): qty 1

## 2015-01-24 MED ORDER — DOXYCYCLINE HYCLATE 100 MG IV SOLR
100.0000 mg | Freq: Two times a day (BID) | INTRAVENOUS | Status: DC
  Administered 2015-01-25: 100 mg via INTRAVENOUS
  Filled 2015-01-24 (×6): qty 100

## 2015-01-24 MED ORDER — LEVALBUTEROL HCL 0.63 MG/3ML IN NEBU
0.6300 mg | INHALATION_SOLUTION | Freq: Four times a day (QID) | RESPIRATORY_TRACT | Status: DC | PRN
  Administered 2015-01-24 – 2015-01-27 (×2): 0.63 mg via RESPIRATORY_TRACT
  Filled 2015-01-24 (×2): qty 3

## 2015-01-24 NOTE — Progress Notes (Signed)
CRITICAL VALUE ALERT  Critical value received:  PH 7.38, CO2 19.9, PO2 90.8, Bicarb 11.6  Date of notification:  01/24/2015   Time of notification:  4270  Critical value read back:Yes.    Nurse who received alert:  Payton Emerald, RN   MD notified (1st page): Dr. Broadus John  Time of first page:  1159  MD notified (2nd page):  Time of second page:  Responding MD:    Time MD responded:  1159

## 2015-01-24 NOTE — Progress Notes (Signed)
Patient Name: Amanda Wilkins Date of Encounter: 01/24/2015     Principal Problem:   Atrial fibrillation with RVR Active Problems:   Palpitations   Upper respiratory disease   Dehydration   Breast pain, right   A-fib   Fever   Nausea & vomiting   Sepsis   Acute renal insufficiency    SUBJECTIVE  The patient is not having any chest pain.  She remains in atrial fibrillation.  Ventricular response 105 bpm on diltiazem drip 15 mg/h   CURRENT MEDS . aspirin EC  325 mg Oral Daily  . atenolol  50 mg Oral Daily  . enoxaparin (LOVENOX) injection  30 mg Subcutaneous Q24H  . estrogens (conjugated)  0.3 mg Oral Q48H  . LORazepam  1 mg Oral Once  . piperacillin-tazobactam (ZOSYN)  IV  3.375 g Intravenous Q8H  . sertraline  50 mg Oral Daily  . sodium chloride  3 mL Intravenous Q12H  . [START ON 01/25/2015] vancomycin  1,500 mg Intravenous Q48H    OBJECTIVE  Filed Vitals:   01/23/15 2037 01/23/15 2302 01/24/15 0302 01/24/15 0425  BP: 114/44 146/56 140/112 115/83  Pulse: 111 124 117 108  Temp: 98.7 F (37.1 C) 99.1 F (37.3 C) 99.2 F (37.3 C) 98.4 F (36.9 C)  TempSrc: Oral Oral Oral Oral  Resp: 22 24  24   Height:      Weight:      SpO2: 99% 98% 98% 97%    Intake/Output Summary (Last 24 hours) at 01/24/15 0901 Last data filed at 01/23/15 1845  Gross per 24 hour  Intake    480 ml  Output      2 ml  Net    478 ml   Filed Weights   01/23/15 0022 01/23/15 0400  Weight: 232 lb 12.8 oz (105.597 kg) 232 lb 12.8 oz (105.597 kg)    PHYSICAL EXAM  General: Pleasant, NAD. Neuro: Alert and oriented X 3. Moves all extremities spontaneously. Psych: Normal affect. HEENT:  Normal  Neck: Supple without bruits or JVD. Lungs:  Resp regular and unlabored, CTA. Heart: Irregularly irregular no s3, s4, or murmurs. Abdomen: Soft, non-tender, non-distended, BS + x 4.  Extremities: No clubbing, cyanosis or edema. DP/PT/Radials 2+ and equal bilaterally.  Accessory Clinical  Findings  CBC  Recent Labs  01/22/15 1750 01/24/15 0500  WBC 9.5 6.3  HGB 14.9 11.8*  HCT 42.3 33.4*  MCV 98.1 95.4  PLT 148* PENDING   Basic Metabolic Panel  Recent Labs  01/22/15 1750 01/23/15 1230 01/24/15 0500  NA 127* 127* 125*  K 3.8 3.9 3.8  CL 94* 101 100  CO2 16* 13* 18*  GLUCOSE 156* 116* 107*  BUN 32* 38* 52*  CREATININE 1.97* 2.41* 3.25*  CALCIUM 8.6 7.3* 7.0*  MG 1.8  --   --   PHOS 3.3  --   --    Liver Function Tests  Recent Labs  01/24/15 0500  AST 54*  ALT 39*  ALKPHOS 38*  BILITOT 1.4*  PROT 5.0*  ALBUMIN 2.2*   No results for input(s): LIPASE, AMYLASE in the last 72 hours. Cardiac Enzymes  Recent Labs  01/23/15 1050 01/23/15 1230  TROPONINI 0.03 <0.03   BNP Invalid input(s): POCBNP D-Dimer No results for input(s): DDIMER in the last 72 hours. Hemoglobin A1C No results for input(s): HGBA1C in the last 72 hours. Fasting Lipid Panel No results for input(s): CHOL, HDL, LDLCALC, TRIG, CHOLHDL, LDLDIRECT in the last 72 hours. Thyroid Function Tests  Recent Labs  01/23/15 1230  TSH 2.917    TELE  Atrial fibrillation with slightly elevated ventricular response  ECG    Radiology/Studies  Dg Chest 2 View  01/22/2015   CLINICAL DATA:  Atrial fibrillation. Shortness of breath with exertion. Right axillary pain. No injury.  EXAM: CHEST  2 VIEW  COMPARISON:  09/22/2014  FINDINGS: Normal heart size and pulmonary vascularity. Perihilar interstitial changes suggesting chronic bronchitis. Linear atelectasis or scarring in the lung bases. No focal consolidation. No blunting of costophrenic angles. No pneumothorax. Mediastinal contours appear intact. Calcified aorta. Mild degenerative changes in the spine.  IMPRESSION: Chronic bronchitic changes in the lungs with slight fibrosis or linear atelectasis in the lung bases. No focal consolidation.   Electronically Signed   By: Lucienne Capers M.D.   On: 01/22/2015 23:46   Ct Chest Wo  Contrast  01/23/2015   CLINICAL DATA:  69 year old female with right-sided chest and axillary pain. Sepsis. Fever for 1-2 weeks.  EXAM: CT CHEST WITHOUT CONTRAST  TECHNIQUE: Multidetector CT imaging of the chest was performed following the standard protocol without IV contrast.  COMPARISON:  01/22/2015 chest radiograph. Chest CT report dated 09/22/2014. Images are not available at this time due to PACS downtime.  FINDINGS: Mediastinum/Nodes: The heart and great vessels are unremarkable. There is no evidence of thoracic aortic aneurysm. No pleural or pericardial effusions are identified. No enlarged lymph nodes are noted.  Lungs/Pleura: Mild dependent and basilar atelectasis noted. There is no evidence of airspace disease, consolidation, suspicious nodule, mass or endobronchial/endotracheal lesion.  Upper abdomen: Hepatic steatosis identified.  Musculoskeletal: Right axillary inflammation is identified extending inferiorly along the right chest. There is no evidence of gas or abscess. No acute or suspicious bony abnormalities are identified.  IMPRESSION: Inflammation in the right axilla extending inferiorly along the right chest -suspicious for infection given clinical history. No evidence of focal abscess or gas.  Mild dependent/basilar atelectasis.   Electronically Signed   By: Margarette Canada M.D.   On: 01/23/2015 11:12   Dg Chest Port 1 View  01/23/2015   CLINICAL DATA:  Shortness of breath.  EXAM: PORTABLE CHEST - 1 VIEW  COMPARISON:  Same day.  FINDINGS: The heart size and mediastinal contours are within normal limits. Both lungs are clear. No pneumothorax or pleural effusion is noted. Left-sided PICC line is noted with tip in expected position of cavoatrial junction. The visualized skeletal structures are unremarkable.  IMPRESSION: No acute cardiopulmonary abnormality seen.   Electronically Signed   By: Marijo Conception, M.D.   On: 01/23/2015 18:52   Dg Chest Port 1 View  01/23/2015   CLINICAL DATA:  PICC  line placement  EXAM: PORTABLE CHEST - 1 VIEW  COMPARISON:  CT scan of the chest same day  FINDINGS: Cardiomediastinal silhouette is unremarkable. No acute infiltrate or pulmonary edema. There is a left arm PICC line with tip in right atrium. For distal SVC position the PICC line should be retracted about 1 cm. No pneumothorax.  IMPRESSION: Left arm PICC line with tip in right atrium. For distal SVC position PICC line should be retracted about 1 cm. No pneumothorax.   Electronically Signed   By: Lahoma Crocker M.D.   On: 01/23/2015 17:45    ASSESSMENT AND PLAN  Ms. Heaven Meeker is a 69 yo woman with PMH of paroxysmal atrial fibrillation on high dose aspirin only, 4 episodes in the past that always cardioverted on their own. She states that her last episode  was in 2009 She was admitted with an acute URI and right armpit infection and was found to be in a-fib with RVR, hypovolemic, hyponatremic with an acute renal failure. A-fib clearly triggered by an acute infectious disease and fever.   Plan: Continue rate control with IV diltiazem and oral beta blocker.  Signed, Darlin Coco MD

## 2015-01-24 NOTE — Progress Notes (Signed)
Pt very restless; New orders for STAT ABG at this time; will cont. To monitor.

## 2015-01-24 NOTE — Consult Note (Signed)
PULMONARY / CRITICAL CARE MEDICINE   Name: Amanda Wilkins MRN: [id number] DOB: [birth year]    ADMISSION DATE:  01/22/2015 CONSULTATION DATE:  01/24/15  REFERRING MD :  Dr. Broadus John   CHIEF COMPLAINT:  Sepsis   INITIAL PRESENTATION: 69 y/o F admitted 4/9 with a 2 day hx of N/V/D, fever/malaise and palpitations.  PCCM consulted for evaluation 4/11.   STUDIES:  4/10 CT Chest >> inflammation in R axilla extending inferiorly along R chest, no focal evidence of abscess or gas, mild dependent atx 4/11 CT Abd/Pelvis >>  4/11 Renal US >> no hydronephrosis, cholelithiasis, small L pleural effusion   SIGNIFICANT EVENTS: 4/09  Admit with N/V/D. Lactic acid 3.6 4/11  PCCM consulted for sepsis.  Lactic acid 1.4   HISTORY OF PRESENT ILLNESS:  69 y/o F, non-smoker,  with PMH of migraines, HTN, depression, ETOH (3 drinks daily but denies w/d sx if she does not drink) obestiy, arthritis and chronic interstitial nephritis who presented 4/9 with a 2 day hx of N/V/D, fever/malaise and palpitations.  Patient reports her symptoms began on Thursday 4/7.  Her daughter recently had a viral illness similar to her presenting symptoms.  Admit work up was notable for lactic acid of 3.6, WBC 9.5 and AKI. She also complained of R axilla / breast pain and CT of chest was obtained which noted inflammation but no gas or abscess.  Blood cultures assessed and growing 1/2 with GPC's.  Renal US negative for hydronephrosis but notable for cholelithiasis & small L pleural effusion.  Pt developed tachypnea / mild SOB.  She indicates she has been on abx in the past 3 months for UTI (feels like it was in February) and symptoms initially improved but then "came back with a vengeance" (burning, frequency, urgency).  PCCM consulted for evaluation.   PAST MEDICAL HISTORY :   has a past medical history of Allergy; Atrial fibrillation; Migraines; Hypertension; Chronic interstitial nephritis; Depression; Palpitations; Obesity; and  Arthritis.  has past surgical history that includes Cesarean section; Dental surgery; and repair of torn meniscus on left 08/2014 (Left, 08/2015).   Prior to Admission medications   Medication Sig Start Date End Date Taking? Authorizing Provider  aspirin EC 325 MG tablet Take 325 mg by mouth at bedtime.    Yes Historical Provider, MD  atenolol (TENORMIN) 50 MG tablet TAKE 1 TABLET BY MOUTH DAILY Patient taking differently: TAKE 1 TABLET BY MOUTH DAILY AT BEDTIME 03/22/14  Yes Aleksei Plotnikov V, MD  Cholecalciferol (VITAMIN D) 2000 UNITS tablet Take 2,000 Units by mouth daily.   Yes Historical Provider, MD  DILT-XR 180 MG 24 hr capsule TAKE 1 CAPSULE BY MOUTH DAILY 08/05/14  Yes Aleksei Plotnikov V, MD  estrogens, conjugated, (PREMARIN) 0.3 MG tablet Take daily Patient taking differently: Take 0.15 mg by mouth at bedtime. Tapering down:  Take 1/2 tablet (0.15 mg) daily until the end of May, then stop 01/19/14  Yes Terrance Mass, MD  glycopyrrolate (ROBINUL) 1 MG tablet Take 1-2 mg by mouth 2 (two) times daily. Take 2 tablets (2 mg) every morning and 1 tablet (1 mg) every night   Yes Historical Provider, MD  medroxyPROGESTERone (PROVERA) 10 MG tablet TAKE ONE TABLET 10 DAYS OF THE MONTH Patient taking differently: Take 10 mg by mouth See admin instructions. Take 1 tablet (10 mg) on the 1st thru 10th of each month. Will stop after the 10th of May 2016 01/19/14  Yes Terrance Mass, MD  mupirocin ointment (BACTROBAN) 2 %  Applied twice a day to the affected area;NOT into eyes. Patient taking differently: Apply 1 application topically 2 (two) times daily as needed (ear drainage/swelling). Do not use in eyes 01/04/15  Yes Hendricks Limes, MD  nitrofurantoin, macrocrystal-monohydrate, (MACROBID) 100 MG capsule Take one twice a day for one week then take one after intercourse Patient taking differently: Take 100 mg by mouth once as needed (after intercourse to prevent UTI).  12/06/14  Yes Terrance Mass,  MD  sertraline (ZOLOFT) 50 MG tablet Take 1 tablet (50 mg total) by mouth daily. Patient taking differently: Take 50 mg by mouth at bedtime.  01/19/14  Yes Terrance Mass, MD  celecoxib (CELEBREX) 200 MG capsule Take 200 mg by mouth daily.    Historical Provider, MD  clarithromycin (BIAXIN XL) 500 MG 24 hr tablet Take 2 tablets (1,000 mg total) by mouth daily. Patient not taking: Reported on 01/23/2015 01/04/15   Hendricks Limes, MD   Allergies  Allergen Reactions  . Epinephrine Other (See Comments)    Heart rate soars  . Epinephrine Base Other (See Comments)    Seriously increases heart rate  . Aspirin Other (See Comments)    Can take the coated 325 mg. Plain asa 325 mg speeds up the heart.  . Penicillins Other (See Comments)    Pt previously has been told not to take because of family history of reactions (water blisters). She took amoxicillin in 2012-2013 with no reaction    FAMILY HISTORY:  indicated that her mother is deceased. She indicated that her father is deceased. She indicated that her brother is deceased.    SOCIAL HISTORY:  reports that she has never smoked. She has never used smokeless tobacco. She reports that she drinks about 8.4 oz of alcohol per week. She reports that she does not use illicit drugs.  REVIEW OF SYSTEMS:   Gen: Denies weight change, fatigue, night sweats.  Reports fevers, chills HEENT: Denies blurred vision, double vision, hearing loss, tinnitus, sinus congestion, rhinorrhea, sore throat, neck stiffness, dysphagia PULM: Denies cough, sputum production, hemoptysis, wheezing.  Reports mild shortness of breath CV: Denies chest pain, edema, orthopnea, paroxysmal nocturnal dyspnea, palpitations GI: Denies hematochezia, melena, constipation, change in bowel habits.   Reports abdominal pain, nausea, vomiting, diarrhea, GU: Denies hematuria, polyuria, oliguria, urethral discharge.  Reports frequency, urgency Endocrine: Denies hot or cold intolerance, polyuria,  polyphagia or appetite change Derm: Denies rash, dry skin, scaling or peeling skin change Heme: Denies easy bruising, bleeding, bleeding gums Neuro: Denies headache, numbness, weakness, slurred speech, loss of memory or consciousness   SUBJECTIVE:   VITAL SIGNS: Temp:  [98.1 F (36.7 C)-99.2 F (37.3 C)] 98.4 F (36.9 C) (04/11 0425) Pulse Rate:  [62-124] 108 (04/11 0425) Resp:  [20-24] 24 (04/11 0425) BP: (102-146)/(44-112) 115/83 mmHg (04/11 0425) SpO2:  [90 %-99 %] 97 % (04/11 0425)   HEMODYNAMICS:     VENTILATOR SETTINGS:     INTAKE / OUTPUT:  Intake/Output Summary (Last 24 hours) at 01/24/15 1313 Last data filed at 01/24/15 1100  Gross per 24 hour  Intake    720 ml  Output    325 ml  Net    395 ml    PHYSICAL EXAMINATION: General:  Obese female in NAD Neuro:  AAOx4, speech clear, MAE  HEENT:  MM pink/moist, short / thick neck  Cardiovascular:  s1s2 distant, irr irr  Lungs:  resp's even/non-labored, lungs bilaterally diminished lower posterior Abdomen:  Obese, soft, bsx4 active  Musculoskeletal:  No acute deformities  Skin:  Moist, warm, no overt edema   LABS:  CBC  Recent Labs Lab 01/22/15 1750 01/24/15 0500  WBC 9.5 6.3  HGB 14.9 11.8*  HCT 42.3 33.4*  PLT 148* 84*   Coag's No results for input(s): APTT, INR in the last 168 hours.   BMET  Recent Labs Lab 01/22/15 1750 01/23/15 1230 01/24/15 0500  NA 127* 127* 125*  K 3.8 3.9 3.8  CL 94* 101 100  CO2 16* 13* 18*  BUN 32* 38* 52*  CREATININE 1.97* 2.41* 3.25*  GLUCOSE 156* 116* 107*   Electrolytes  Recent Labs Lab 01/22/15 1750 01/23/15 1230 01/24/15 0500  CALCIUM 8.6 7.3* 7.0*  MG 1.8  --   --   PHOS 3.3  --   --    Sepsis Markers  Recent Labs Lab 01/23/15 1230 01/23/15 1900 01/24/15 1045  LATICACIDVEN 3.6* 3.3* 1.4   ABG  Recent Labs Lab 01/24/15 1130  PHART 7.386  PCO2ART 19.9*  PO2ART 90.8   Liver Enzymes  Recent Labs Lab 01/24/15 0500  AST 54*  ALT  39*  ALKPHOS 38*  BILITOT 1.4*  ALBUMIN 2.2*   Cardiac Enzymes  Recent Labs Lab 01/23/15 1050 01/23/15 1230  TROPONINI 0.03 <0.03   Glucose No results for input(s): GLUCAP in the last 168 hours.  Imaging Ct Chest Wo Contrast  01/23/2015   CLINICAL DATA:  69 year old female with right-sided chest and axillary pain. Sepsis. Fever for 1-2 weeks.  EXAM: CT CHEST WITHOUT CONTRAST  TECHNIQUE: Multidetector CT imaging of the chest was performed following the standard protocol without IV contrast.  COMPARISON:  01/22/2015 chest radiograph. Chest CT report dated 09/22/2014. Images are not available at this time due to PACS downtime.  FINDINGS: Mediastinum/Nodes: The heart and great vessels are unremarkable. There is no evidence of thoracic aortic aneurysm. No pleural or pericardial effusions are identified. No enlarged lymph nodes are noted.  Lungs/Pleura: Mild dependent and basilar atelectasis noted. There is no evidence of airspace disease, consolidation, suspicious nodule, mass or endobronchial/endotracheal lesion.  Upper abdomen: Hepatic steatosis identified.  Musculoskeletal: Right axillary inflammation is identified extending inferiorly along the right chest. There is no evidence of gas or abscess. No acute or suspicious bony abnormalities are identified.  IMPRESSION: Inflammation in the right axilla extending inferiorly along the right chest -suspicious for infection given clinical history. No evidence of focal abscess or gas.  Mild dependent/basilar atelectasis.   Electronically Signed   By: Margarette Canada M.D.   On: 01/23/2015 11:12   Dg Chest Port 1 View  01/23/2015   CLINICAL DATA:  Shortness of breath.  EXAM: PORTABLE CHEST - 1 VIEW  COMPARISON:  Same day.  FINDINGS: The heart size and mediastinal contours are within normal limits. Both lungs are clear. No pneumothorax or pleural effusion is noted. Left-sided PICC line is noted with tip in expected position of cavoatrial junction. The visualized  skeletal structures are unremarkable.  IMPRESSION: No acute cardiopulmonary abnormality seen.   Electronically Signed   By: Marijo Conception, M.D.   On: 01/23/2015 18:52   Dg Chest Port 1 View  01/23/2015   CLINICAL DATA:  PICC line placement  EXAM: PORTABLE CHEST - 1 VIEW  COMPARISON:  CT scan of the chest same day  FINDINGS: Cardiomediastinal silhouette is unremarkable. No acute infiltrate or pulmonary edema. There is a left arm PICC line with tip in right atrium. For distal SVC position the PICC line should be  retracted about 1 cm. No pneumothorax.  IMPRESSION: Left arm PICC line with tip in right atrium. For distal SVC position PICC line should be retracted about 1 cm. No pneumothorax.   Electronically Signed   By: Lahoma Crocker M.D.   On: 01/23/2015 17:45     ASSESSMENT / PLAN:  PULMONARY OETT A: Dependent Atelectasis  Tachypnea - in setting of sepsis / AKI Trace Pleural Effusions P:   Pulmonary hygiene Oxygen to support sats > 92% Consider diuresis once renal function allows Upright position    CARDIOVASCULAR PICC LUE 4/10 >>  A:  Atrial Fibrillation - hx of PAF in past, currently related to sepsis of unclear etiology  Sepsis - unclear source P:  NS @ 75 ml/hr Atenolol, Cardizem per Cardiology for rate control ASA  RENAL A:   Acute Kidney Injury - in setting of sepsis Lactic Acidosis - resolving.  AG 7 Hyponatremia  P:   Hold nephrotoxic agents NS @ 75 Renal consult called per primary SVC Monitor I/O's Assess Renal US Renal dose abx/meds per pharmacy   GASTROINTESTINAL A:   Obesity N/V/D  Suspect Viral Gastroenteritis  P:   See ID  PRN Zofran  Enteric precautions  HEMATOLOGIC A:   Thrombocytopenia  P:  Trend CBC DVT: lovenox sq  INFECTIOUS A:   Sepsis - unclear etiology, 1/2 BC's with GPC's.  Likely viral gastroenteritis  Nausea / Vomiting / Diarrhea Chest Wall Induration / R Breast / Axilla Pain  P:   BCx2 4/11 >> 1/2 GPC's in chains >>  C-Diff  4/11 >>  Flu 4/11 >>  UA 4/10 >> few bacteria, 7-10 WBC  UC 4/11 >>   Vanco, start date 4/10, day 2/x Zosyn, start date 4/10, day 2/x  Assess PCT  Repeat lactic acid reassuring  ENDOCRINE A:   Mild Hyperglycemia P:   Monitor glucose on BMP, if consistently > 180 add SSI   NEUROLOGIC A:   Depression  P:   RASS goal: n/a Continue zoloft, per primary SVC   FAMILY  - Updates: Patient updated at bedside.   Noe Gens, NP-C [facility] Pgr: [phone] or [phone]  I have reviewed her CXR myself and see no evidence of pulmonary infiltrate.  Patient seen and examined, patient's work of breathing is strictly due to metabolic acidosis.  This is likely a patient who has a viral gastroenteritis with diarrhea that resulted in her dehydration then pre-renal azotemia with acidosis.  She is hyponatremic with NAG acidosis due to her diarrhea and dehydration.  She has cleared her lactic acid quite nicely and bicarb is on the rise (use the BMET not ABG as the bicarb on ABG is calculated not measured).  I see no need for bicarb at this time and the renal service has been consulted and will assist with that.  I agree with abx choices at this time and would f/u on culture.  The ID service has been consulted and should manage that.  BP is stable and lactic acid is clearing so this is clearly not septic shock.  Respiratory status will improve as acidosis is improved.  If becomes a larger issue that is causing respiratory failure would consider changing her IVF to a bicarb drip but that is not necessary for now.    Problem List: - Increased WOB, not quite to the point of respiratory failure. - Acute NAG metabolic acidosis due to renal failure and dehydration. - Acute renal failure due to dehydration. - Acute  hyponatremia due to diarrhea. - Acute gastroenteritis.  Recommendations as above.  PCCM will sign off, please call back as needed or if patient deteriorates.  Patient  seen and examined, agree with above note.  I dictated the care and orders written for this patient under my direction.  Rush Farmer, MD [phone]  01/24/2015, 1:13 PM

## 2015-01-24 NOTE — Progress Notes (Signed)
Received call from [facility] to advise positive blood cultures on patient. Aerobic bottle gram+ cocci and in chains.

## 2015-01-24 NOTE — Consult Note (Signed)
Amanda Wilkins is a 69 yo woman with PMH of paroxysmal atrial fibrillation , obesity, hypertension who has had fever, chills, nausea/vomiting/diarrhea, body aches about 1-2 weeks PTA that has persisted and developed tachypalpitations. She was noted to be in atrial fibrillation with RVR in the ER. Her HR improved some with IV diltiazem and fluid resuscitation. She also c/o pain in her right chest/breast region Her creatinine was also noted to be 1.25m/dl c/w cr of 0.8 on 11/11/14 and up to 3.25 on 01/24/15.  BC were pos for 1/2 for GPC and CT of chest neg for abscess.  BP as low as 90/41 on 4/10.  We are asked to see to assist with mgmt. She was taking celebrex PTA.   Past Medical History  Diagnosis Date  . Allergy   . Atrial fibrillation   . Migraines     on Zoloft for migraines  . Hypertension   . Chronic interstitial nephritis   . Depression   . Palpitations   . Obesity   . Arthritis     ON BOTH KNEES   Past Surgical History  Procedure Laterality Date  . Cesarean section    . Dental surgery      multiple implants  . Repair of torn meniscus on left 08/2014 Left 08/2015    [facility]  Social History:  reports that she has never smoked. She has never used smokeless tobacco. She reports that she drinks about 8.4 oz of alcohol per week. She reports that she does not use illicit drugs. Allergies:  Allergies  Allergen Reactions  . Epinephrine Other (See Comments)    Heart rate soars  . Epinephrine Base Other (See Comments)    Seriously increases heart rate  . Aspirin Other (See Comments)    Can take the coated 325 mg. Plain asa 325 mg speeds up the heart.  . Penicillins Other (See Comments)    Pt previously has been told not to take because of family history of reactions (water blisters). She took amoxicillin in 2012-2013 with no reaction   Family History  Problem Relation Age of Onset  . Cancer Brother     PANCREATIC    Medications:  Prior to Admission:   Prescriptions prior to admission  Medication Sig Dispense Refill Last Dose  . aspirin EC 325 MG tablet Take 325 mg by mouth at bedtime.    01/19/2015  . atenolol (TENORMIN) 50 MG tablet TAKE 1 TABLET BY MOUTH DAILY (Patient taking differently: TAKE 1 TABLET BY MOUTH DAILY AT BEDTIME) 90 tablet 3 01/19/2015 at 2230  . Cholecalciferol (VITAMIN D) 2000 UNITS tablet Take 2,000 Units by mouth daily.   01/19/2015  . DILT-XR 180 MG 24 hr capsule TAKE 1 CAPSULE BY MOUTH DAILY 90 capsule 3 01/19/2015  . estrogens, conjugated, (PREMARIN) 0.3 MG tablet Take daily (Patient taking differently: Take 0.15 mg by mouth at bedtime. Tapering down:  Take 1/2 tablet (0.15 mg) daily until the end of May, then stop) 30 tablet 11 01/19/2015  . glycopyrrolate (ROBINUL) 1 MG tablet Take 1-2 mg by mouth 2 (two) times daily. Take 2 tablets (2 mg) every morning and 1 tablet (1 mg) every night   01/19/2015 at pm  . medroxyPROGESTERone (PROVERA) 10 MG tablet TAKE ONE TABLET 10 DAYS OF THE MONTH (Patient taking differently: Take 10 mg by mouth See admin instructions. Take 1 tablet (10 mg) on the 1st thru 10th of each month. Will stop after the 10th of May 2016)  30 tablet 4 01/19/2015  . mupirocin ointment (BACTROBAN) 2 % Applied twice a day to the affected area;NOT into eyes. (Patient taking differently: Apply 1 application topically 2 (two) times daily as needed (ear drainage/swelling). Do not use in eyes) 15 g 0 week ago  . nitrofurantoin, macrocrystal-monohydrate, (MACROBID) 100 MG capsule Take one twice a day for one week then take one after intercourse (Patient taking differently: Take 100 mg by mouth once as needed (after intercourse to prevent UTI). ) 14 capsule 5 month ago  . sertraline (ZOLOFT) 50 MG tablet Take 1 tablet (50 mg total) by mouth daily. (Patient taking differently: Take 50 mg by mouth at bedtime. ) 90 tablet 3 01/19/2015  . celecoxib (CELEBREX) 200 MG capsule Take 200 mg by mouth daily.   01/19/2015  . clarithromycin (BIAXIN  XL) 500 MG 24 hr tablet Take 2 tablets (1,000 mg total) by mouth daily. (Patient not taking: Reported on 01/23/2015) 14 tablet 0 Not Taking at Unknown time   Scheduled: . aspirin EC  325 mg Oral Daily  . atenolol  50 mg Oral Daily  . doxycycline (VIBRAMYCIN) IV  100 mg Intravenous Q12H  . enoxaparin (LOVENOX) injection  30 mg Subcutaneous Q24H  . estrogens (conjugated)  0.3 mg Oral Q48H  . LORazepam  1 mg Oral Once  . piperacillin-tazobactam (ZOSYN)  IV  2.25 g Intravenous 4 times per day  . sertraline  50 mg Oral Daily  . sodium chloride  3 mL Intravenous Q12H  . [START ON 01/25/2015] vancomycin  1,500 mg Intravenous Q48H   ROS: as per HPI, does have arthritis but not in pain currently.  Blood pressure 115/83, pulse 108, temperature 98.4 F (36.9 C), temperature source Oral, resp. rate 24, height 5' 6"  (1.676 m), weight 105.597 kg (232 lb 12.8 oz), SpO2 97 %.  General appearance: alert and cooperative Head: Normocephalic, without obvious abnormality, atraumatic Throat: lips, mucosa, and tongue normal; teeth and gums normal Resp: clear to auscultation bilaterally Chest wall: no tenderness, tender right upper outer breast with no mass Cardio: regular rate and rhythm, S1, S2 normal, no murmur, click, rub or gallop GI: soft, non-tender; bowel sounds normal; no masses,  no organomegaly Extremities: extremities normal, atraumatic, no cyanosis or edema Skin: Skin color, texture, turgor normal. No rashes or lesions Neurologic: Grossly normal Results for orders placed or performed during the hospital encounter of 01/22/15 (from the past 48 hour(s))  Basic metabolic panel     Status: Abnormal   Collection Time: 01/22/15  5:50 PM  Result Value Ref Range   Sodium 127 (L) 135 - 145 mmol/L   Potassium 3.8 3.5 - 5.1 mmol/L   Chloride 94 (L) 96 - 112 mmol/L   CO2 16 (L) 19 - 32 mmol/L   Glucose, Bld 156 (H) 70 - 99 mg/dL   BUN 32 (H) 6 - 23 mg/dL   Creatinine, Ser 1.97 (H) 0.50 - 1.10 mg/dL    Calcium 8.6 8.4 - 10.5 mg/dL   GFR calc non Af Amer 25 (L) >90 mL/min   GFR calc Af Amer 29 (L) >90 mL/min    Comment: (NOTE) The eGFR has been calculated using the CKD EPI equation. This calculation has not been validated in all clinical situations. eGFR's persistently <90 mL/min signify possible Chronic Kidney Disease.    Anion gap 17 (H) 5 - 15  CBC     Status: Abnormal   Collection Time: 01/22/15  5:50 PM  Result Value Ref Range  WBC 9.5 4.0 - 10.5 K/uL   RBC 4.31 3.87 - 5.11 MIL/uL   Hemoglobin 14.9 12.0 - 15.0 g/dL   HCT 42.3 36.0 - 46.0 %   MCV 98.1 78.0 - 100.0 fL   MCH 34.6 (H) 26.0 - 34.0 pg   MCHC 35.2 30.0 - 36.0 g/dL   RDW 13.6 11.5 - 15.5 %   Platelets 148 (L) 150 - 400 K/uL  Magnesium     Status: None   Collection Time: 01/22/15  5:50 PM  Result Value Ref Range   Magnesium 1.8 1.5 - 2.5 mg/dL  Phosphorus     Status: None   Collection Time: 01/22/15  5:50 PM  Result Value Ref Range   Phosphorus 3.3 2.3 - 4.6 mg/dL  I-stat troponin, ED (do not order at [facility])     Status: None   Collection Time: 01/22/15  6:07 PM  Result Value Ref Range   Troponin i, poc 0.00 0.00 - 0.08 ng/mL   Comment 3            Comment: Due to the release kinetics of cTnI, a negative result within the first hours of the onset of symptoms does not rule out myocardial infarction with certainty. If myocardial infarction is still suspected, repeat the test at appropriate intervals.   Influenza panel by PCR (type A & B, H1N1)     Status: None   Collection Time: 01/23/15  8:00 AM  Result Value Ref Range   Influenza A By PCR NEGATIVE NEGATIVE   Influenza B By PCR NEGATIVE NEGATIVE   H1N1 flu by pcr NOT DETECTED NOT DETECTED    Comment:        The Xpert Flu assay (FDA approved for nasal aspirates or washes and nasopharyngeal swab specimens), is intended as an aid in the diagnosis of influenza and should not be used as a sole basis for treatment.   Troponin I     Status: None    Collection Time: 01/23/15 10:50 AM  Result Value Ref Range   Troponin I 0.03 <0.031 ng/mL    Comment:        NO INDICATION OF MYOCARDIAL INJURY.   Culture, blood (routine x 2)     Status: None (Preliminary result)   Collection Time: 01/23/15 12:23 PM  Result Value Ref Range   Specimen Description BLOOD RIGHT HAND    Special Requests BOTTLES DRAWN AEROBIC ONLY 4CC    Culture      GRAM POSITIVE COCCI IN CHAINS Note: Gram Stain Report Called to,Read Back By and Verified With: Sherri Rad RN on 01/24/15 at 06:55 by Rise Mu Performed at [facility]    Report Status PENDING   Troponin I     Status: None   Collection Time: 01/23/15 12:30 PM  Result Value Ref Range   Troponin I <0.03 <0.031 ng/mL    Comment:        NO INDICATION OF MYOCARDIAL INJURY.   TSH     Status: None   Collection Time: 01/23/15 12:30 PM  Result Value Ref Range   TSH 2.917 0.350 - 4.500 uIU/mL  Lactic acid, plasma     Status: Abnormal   Collection Time: 01/23/15 12:30 PM  Result Value Ref Range   Lactic Acid, Venous 3.6 (HH) 0.5 - 2.0 mmol/L    Comment: REPEATED TO VERIFY CRITICAL RESULT CALLED TO, READ BACK BY AND VERIFIED WITH: B DAVIS,RN 04.10.16 1355 M SHIPMAN   Culture, blood (routine x 2)  Status: None (Preliminary result)   Collection Time: 01/23/15 12:30 PM  Result Value Ref Range   Specimen Description BLOOD LEFT HAND    Special Requests BOTTLES DRAWN AEROBIC ONLY 5CC    Culture             BLOOD CULTURE RECEIVED NO GROWTH TO DATE CULTURE WILL BE HELD FOR 5 DAYS BEFORE ISSUING A FINAL NEGATIVE REPORT Performed at [facility]    Report Status PENDING   Basic metabolic panel     Status: Abnormal   Collection Time: 01/23/15 12:30 PM  Result Value Ref Range   Sodium 127 (L) 135 - 145 mmol/L   Potassium 3.9 3.5 - 5.1 mmol/L   Chloride 101 96 - 112 mmol/L   CO2 13 (L) 19 - 32 mmol/L   Glucose, Bld 116 (H) 70 - 99 mg/dL   BUN 38 (H) 6 - 23 mg/dL   Creatinine, Ser 2.41  (H) 0.50 - 1.10 mg/dL   Calcium 7.3 (L) 8.4 - 10.5 mg/dL   GFR calc non Af Amer 20 (L) >90 mL/min   GFR calc Af Amer 23 (L) >90 mL/min    Comment: (NOTE) The eGFR has been calculated using the CKD EPI equation. This calculation has not been validated in all clinical situations. eGFR's persistently <90 mL/min signify possible Chronic Kidney Disease.    Anion gap 13 5 - 15  Urinalysis, Routine w reflex microscopic     Status: Abnormal   Collection Time: 01/23/15  6:14 PM  Result Value Ref Range   Color, Urine AMBER (A) YELLOW    Comment: BIOCHEMICALS MAY BE AFFECTED BY COLOR   APPearance TURBID (A) CLEAR   Specific Gravity, Urine 1.024 1.005 - 1.030   pH 5.0 5.0 - 8.0   Glucose, UA NEGATIVE NEGATIVE mg/dL   Hgb urine dipstick MODERATE (A) NEGATIVE   Bilirubin Urine SMALL (A) NEGATIVE   Ketones, ur NEGATIVE NEGATIVE mg/dL   Protein, ur 100 (A) NEGATIVE mg/dL   Urobilinogen, UA 1.0 0.0 - 1.0 mg/dL   Nitrite NEGATIVE NEGATIVE   Leukocytes, UA SMALL (A) NEGATIVE  Urine microscopic-add on     Status: Abnormal   Collection Time: 01/23/15  6:14 PM  Result Value Ref Range   Squamous Epithelial / LPF RARE RARE   WBC, UA 7-10 <3 WBC/hpf   RBC / HPF 0-2 <3 RBC/hpf   Bacteria, UA FEW (A) RARE   Urine-Other AMORPHOUS URATES/PHOSPHATES   Lactic acid, plasma     Status: Abnormal   Collection Time: 01/23/15  7:00 PM  Result Value Ref Range   Lactic Acid, Venous 3.3 (HH) 0.5 - 2.0 mmol/L    Comment: REPEATED TO VERIFY CRITICAL RESULT CALLED TO, READ BACK BY AND VERIFIED WITH: GARDENER,D 1024 01/23/15 TEETERN GARDENER,D RN   Comprehensive metabolic panel     Status: Abnormal   Collection Time: 01/24/15  5:00 AM  Result Value Ref Range   Sodium 125 (L) 135 - 145 mmol/L   Potassium 3.8 3.5 - 5.1 mmol/L   Chloride 100 96 - 112 mmol/L   CO2 18 (L) 19 - 32 mmol/L   Glucose, Bld 107 (H) 70 - 99 mg/dL   BUN 52 (H) 6 - 23 mg/dL   Creatinine, Ser 3.25 (H) 0.50 - 1.10 mg/dL   Calcium 7.0 (L)  8.4 - 10.5 mg/dL   Total Protein 5.0 (L) 6.0 - 8.3 g/dL   Albumin 2.2 (L) 3.5 - 5.2 g/dL   AST 54 (H) 0 -  37 U/L   ALT 39 (H) 0 - 35 U/L   Alkaline Phosphatase 38 (L) 39 - 117 U/L   Total Bilirubin 1.4 (H) 0.3 - 1.2 mg/dL   GFR calc non Af Amer 14 (L) >90 mL/min   GFR calc Af Amer 16 (L) >90 mL/min    Comment: (NOTE) The eGFR has been calculated using the CKD EPI equation. This calculation has not been validated in all clinical situations. eGFR's persistently <90 mL/min signify possible Chronic Kidney Disease.    Anion gap 7 5 - 15  CBC     Status: Abnormal   Collection Time: 01/24/15  5:00 AM  Result Value Ref Range   WBC 6.3 4.0 - 10.5 K/uL   RBC 3.50 (L) 3.87 - 5.11 MIL/uL   Hemoglobin 11.8 (L) 12.0 - 15.0 g/dL    Comment: DELTA CHECK NOTED REPEATED TO VERIFY    HCT 33.4 (L) 36.0 - 46.0 %   MCV 95.4 78.0 - 100.0 fL   MCH 33.7 26.0 - 34.0 pg   MCHC 35.3 30.0 - 36.0 g/dL   RDW 13.6 11.5 - 15.5 %   Platelets 84 (L) 150 - 400 K/uL    Comment: REPEATED TO VERIFY PLATELET COUNT CONFIRMED BY SMEAR   Lactic acid, plasma     Status: None   Collection Time: 01/24/15 10:45 AM  Result Value Ref Range   Lactic Acid, Venous 1.4 0.5 - 2.0 mmol/L  Blood gas, arterial     Status: Abnormal   Collection Time: 01/24/15 11:30 AM  Result Value Ref Range   O2 Content 3.5 L/min   pH, Arterial 7.386 7.350 - 7.450   pCO2 arterial 19.9 (LL) 35.0 - 45.0 mmHg    Comment: CRITICAL RESULT CALLED TO, READ BACK BY AND VERIFIED WITH: DEBBIE BRITT RN AT 11:37 CALLED, BY NIKKI NELSON RRT 01/24/2015    pO2, Arterial 90.8 80.0 - 100.0 mmHg   Bicarbonate 11.6 (L) 20.0 - 24.0 mEq/L   TCO2 12.3 0 - 100 mmol/L   Acid-base deficit 12.5 (H) 0.0 - 2.0 mmol/L   O2 Saturation 96.2 %   Patient temperature 98.6    Collection site RIGHT RADIAL    Drawn by [id number]    Sample type ARTERIAL DRAW    Allens test (pass/fail) PASS PASS  Clostridium Difficile by PCR     Status: None   Collection Time: 01/24/15 12:59  PM  Result Value Ref Range   C difficile by pcr NEGATIVE NEGATIVE   Dg Chest 2 View  01/22/2015   CLINICAL DATA:  Atrial fibrillation. Shortness of breath with exertion. Right axillary pain. No injury.  EXAM: CHEST  2 VIEW  COMPARISON:  09/22/2014  FINDINGS: Normal heart size and pulmonary vascularity. Perihilar interstitial changes suggesting chronic bronchitis. Linear atelectasis or scarring in the lung bases. No focal consolidation. No blunting of costophrenic angles. No pneumothorax. Mediastinal contours appear intact. Calcified aorta. Mild degenerative changes in the spine.  IMPRESSION: Chronic bronchitic changes in the lungs with slight fibrosis or linear atelectasis in the lung bases. No focal consolidation.   Electronically Signed   By: Lucienne Capers M.D.   On: 01/22/2015 23:46   Ct Chest Wo Contrast  01/23/2015   CLINICAL DATA:  69 year old female with right-sided chest and axillary pain. Sepsis. Fever for 1-2 weeks.  EXAM: CT CHEST WITHOUT CONTRAST  TECHNIQUE: Multidetector CT imaging of the chest was performed following the standard protocol without IV contrast.  COMPARISON:  01/22/2015 chest radiograph. Chest CT  report dated 09/22/2014. Images are not available at this time due to PACS downtime.  FINDINGS: Mediastinum/Nodes: The heart and great vessels are unremarkable. There is no evidence of thoracic aortic aneurysm. No pleural or pericardial effusions are identified. No enlarged lymph nodes are noted.  Lungs/Pleura: Mild dependent and basilar atelectasis noted. There is no evidence of airspace disease, consolidation, suspicious nodule, mass or endobronchial/endotracheal lesion.  Upper abdomen: Hepatic steatosis identified.  Musculoskeletal: Right axillary inflammation is identified extending inferiorly along the right chest. There is no evidence of gas or abscess. No acute or suspicious bony abnormalities are identified.  IMPRESSION: Inflammation in the right axilla extending inferiorly  along the right chest -suspicious for infection given clinical history. No evidence of focal abscess or gas.  Mild dependent/basilar atelectasis.   Electronically Signed   By: Margarette Canada M.D.   On: 01/23/2015 11:12   US Renal  01/24/2015   CLINICAL DATA:  Acute renal insufficiency.  EXAM: RENAL/URINARY TRACT ULTRASOUND COMPLETE  COMPARISON:  11/16/2009  FINDINGS: Right Kidney:  Length: 10.1 cm. Echogenicity within normal limits. No mass or hydronephrosis visualized.  Left Kidney:  Length: 10.4 cm. No hydronephrosis. Mild perinephric edema or trace fluid adjacent the lower pole left kidney.  Bladder:  Collapsed around a Foley catheter.  Exam degraded by patient body habitus. Incidental note is made of a 2.6 cm gallstone and a left-sided pleural effusion.  IMPRESSION: 1.  No hydronephrosis.  No explanation for renal insufficiency. 2. Decreased sensitivity and specificity exam due to technique related factors, as described above. 3. Cholelithiasis. 4. Small left pleural effusion.   Electronically Signed   By: Abigail Miyamoto M.D.   On: 01/24/2015 10:56   Dg Chest Port 1 View  01/24/2015   CLINICAL DATA:  Hypoxia, pt was admitted with an acute URI and right armpit infection and was found to be in a-fib with RVR, hypovolemic, hyponatremic with an acute renal failure.  EXAM: PORTABLE CHEST - 1 VIEW  COMPARISON:  Radiograph 01/23/2015  FINDINGS: Left-sided PICC line with tip in distal SVC. Stable cardiac silhouette. There is mild central venous congestion. No focal infiltrate. No pulmonary edema. Mild basilar atelectasis.  IMPRESSION: Mild basilar atelectasis and minimal central venous congestion. No change from prior.   Electronically Signed   By: Suzy Bouchard M.D.   On: 01/24/2015 09:44   Dg Chest Port 1 View  01/23/2015   CLINICAL DATA:  Shortness of breath.  EXAM: PORTABLE CHEST - 1 VIEW  COMPARISON:  Same day.  FINDINGS: The heart size and mediastinal contours are within normal limits. Both lungs are clear.  No pneumothorax or pleural effusion is noted. Left-sided PICC line is noted with tip in expected position of cavoatrial junction. The visualized skeletal structures are unremarkable.  IMPRESSION: No acute cardiopulmonary abnormality seen.   Electronically Signed   By: Marijo Conception, M.D.   On: 01/23/2015 18:52   Dg Chest Port 1 View  01/23/2015   CLINICAL DATA:  PICC line placement  EXAM: PORTABLE CHEST - 1 VIEW  COMPARISON:  CT scan of the chest same day  FINDINGS: Cardiomediastinal silhouette is unremarkable. No acute infiltrate or pulmonary edema. There is a left arm PICC line with tip in right atrium. For distal SVC position the PICC line should be retracted about 1 cm. No pneumothorax.  IMPRESSION: Left arm PICC line with tip in right atrium. For distal SVC position PICC line should be retracted about 1 cm. No pneumothorax.   Electronically Signed  By: Lahoma Crocker M.D.   On: 01/23/2015 17:45    Assessment:  1 AKI, hemodynamically mediated due to afib, hypotension, ?bacteremia, COX-2 inhib  Plan: 1 Supportive therapy with HR control, allow BP to drift upward, antibiotics 2 I would expect worsening renal function in AM and pt advised.  Allyn Bartelson C 01/24/2015, 5:09 PM

## 2015-01-24 NOTE — Progress Notes (Signed)
Pt repositioned in bed at this time; pt very SOB; pt barely able to speak full sentences without getting SOB; MD paged at this time; will await back.

## 2015-01-24 NOTE — Progress Notes (Signed)
Patient complaint of "feeling my bladder is full and need to void". A bladder scan of patient shows 124 ml. 200 ml of urine contained in foley bag, flushed catheter, will continue to monitor.

## 2015-01-24 NOTE — Progress Notes (Signed)
Pt c/o of SOB and having trouble catching her breath. Pt has audible exp wheezes and RR 25-35. Baltazar Najjar, NP paged in regards to patient symptoms. New orders given. Will continue to monitor.

## 2015-01-24 NOTE — Consult Note (Signed)
[facility]    Date of Admission:  01/22/2015  Date of Consult:  01/24/2015  Reason for Consult: FUO, sepsis Referring Physician: Dr. Broadus John   HPI: Amanda Wilkins is an 69 y.o. female sig for obesity, atrial fibrillation, who presented to the hospital on April 9 with malaise loose stools with incontinence fever palpitations. On arrival she been febrile to 101 and vomited. She is unable to keep her meds down at home including her diltiazem. On exam she had had some sensitive and tender mass under her right arm pit and right breast. She was found to be in atrial fibrillation with a rate in the 140s. Patient was admitted atrial fibrillation was controlled. Patient did have labs concerning for severe infection with acute renal failure lactic acidosis of 3.6 and thrombocytopenia. She has been on quite broad-spectrum antibiotics in the form of Zosyn vancomycin and a dose of Tamiflu. Despite antibiotics she is been still uncomfortable and has had temperatures up to 103F. Blood cultures drawn admission are showing gram-positive cocci in pairs and chains from April 10 in one of 2 cultures the second cultures not growing any organisms to date.  Chest x-ray was unremarkable. Urinalysis that showed 7-10 white blood cells with squamous epithelia. She did not have urinary symptoms though on arrival. Since then she has had a CT scan of the chest done which showed no parenchymal lung disease whatsoever and no pleural effusions. It did show inflammatory changes involving her right axilla which is where she is tender on exam.  Today despite proper antibiotics she looks increasingly uncomfortable and is to An blood gas shows this with low PCO2. She is also wheezing on exam when I saw her prior to her transfer to the stepdown unit. She does have worsening thrombocytopenia as well as renal failure.         Past Medical History  Diagnosis Date  . Allergy   . Atrial  fibrillation   . Migraines     on Zoloft for migraines  . Hypertension   . Chronic interstitial nephritis   . Depression   . Palpitations   . Obesity   . Arthritis     ON BOTH KNEES    Past Surgical History  Procedure Laterality Date  . Cesarean section    . Dental surgery      multiple implants  . Repair of torn meniscus on left 08/2014 Left 08/2015    Dr. Ronnie Derby at [facility]  ergies:   Allergies  Allergen Reactions  . Epinephrine Other (See Comments)    Heart rate soars  . Epinephrine Base Other (See Comments)    Seriously increases heart rate  . Aspirin Other (See Comments)    Can take the coated 325 mg. Plain asa 325 mg speeds up the heart.  . Penicillins Other (See Comments)    Pt previously has been told not to take because of family history of reactions (water blisters). She took amoxicillin in 2012-2013 with no reaction     Medications: I have reviewed patients current medications as documented in Epic Anti-infectives    Start     Dose/Rate Route Frequency Ordered Stop   01/25/15 1200  vancomycin (VANCOCIN) 1,500 mg in sodium chloride 0.9 % 500 mL IVPB     1,500 mg 250 mL/hr over 120 Minutes Intravenous Every 48 hours 01/23/15 1417     01/24/15 1600  doxycycline (VIBRAMYCIN) 100 mg in dextrose 5 % 250 mL IVPB  100 mg 125 mL/hr over 120 Minutes Intravenous Every 12 hours 01/24/15 1500     01/24/15 1400  piperacillin-tazobactam (ZOSYN) IVPB 2.25 g     2.25 g 100 mL/hr over 30 Minutes Intravenous 4 times per day 01/24/15 1148     01/24/15 1000  vancomycin (VANCOCIN) IVPB 1000 mg/200 mL premix  Status:  Discontinued     1,000 mg 200 mL/hr over 60 Minutes Intravenous Every 24 hours 01/23/15 0846 01/23/15 1417   01/23/15 2200  piperacillin-tazobactam (ZOSYN) IVPB 3.375 g  Status:  Discontinued     3.375 g 12.5 mL/hr over 240 Minutes Intravenous Every 8 hours 01/23/15 0846 01/24/15 1148   01/23/15 1000  oseltamivir (TAMIFLU) capsule 30 mg  Status:  Discontinued      30 mg Oral 2 times daily 01/23/15 0803 01/23/15 1413   01/23/15 0900  vancomycin (VANCOCIN) 2,000 mg in sodium chloride 0.9 % 500 mL IVPB     2,000 mg 250 mL/hr over 120 Minutes Intravenous  Once 01/23/15 0846 01/23/15 1401   01/23/15 0845  piperacillin-tazobactam (ZOSYN) IVPB 3.375 g     3.375 g 100 mL/hr over 30 Minutes Intravenous  Once 01/23/15 0846 01/23/15 2013      Social History:  reports that she has never smoked. She has never used smokeless tobacco. She reports that she drinks about 8.4 oz of alcohol per week. She reports that she does not use illicit drugs.  Family History  Problem Relation Age of Onset  . Cancer Brother     PANCREATIC    As in HPI and primary teams notes otherwise 12 point review of systems is negative  Blood pressure 115/83, pulse 108, temperature 98.4 F (36.9 C), temperature source Oral, resp. rate 24, height 5' 6"  (1.676 m), weight 232 lb 12.8 oz (105.597 kg), SpO2 97 %. General: Alert and awake, oriented x3, uncomfortable and tachypneic HEENT: anicteric sclera,  EOMI, oropharynx clear and without exudate CVS tachycardic, irr, irr r,  no murmur rubs or gallops Chest: wheezing esp posteriorly Abdomen: soft nontender, some tenderness in her epigastrium positive bowel sounds, Axilla and right breast:    right axilla has some areas of exquisite tenderness to palpation that extends down the right chest wall and also present in right outer quadrant of her breast see picture below of axilla  01/24/2015:       Neuro: nonfocal, strength and sensation intact   Results for orders placed or performed during the hospital encounter of 01/22/15 (from the past 48 hour(s))  Basic metabolic panel     Status: Abnormal   Collection Time: 01/22/15  5:50 PM  Result Value Ref Range   Sodium 127 (L) 135 - 145 mmol/L   Potassium 3.8 3.5 - 5.1 mmol/L   Chloride 94 (L) 96 - 112 mmol/L   CO2 16 (L) 19 - 32 mmol/L   Glucose, Bld 156 (H) 70 - 99 mg/dL   BUN 32 (H)  6 - 23 mg/dL   Creatinine, Ser 1.97 (H) 0.50 - 1.10 mg/dL   Calcium 8.6 8.4 - 10.5 mg/dL   GFR calc non Af Amer 25 (L) >90 mL/min   GFR calc Af Amer 29 (L) >90 mL/min    Comment: (NOTE) The eGFR has been calculated using the CKD EPI equation. This calculation has not been validated in all clinical situations. eGFR's persistently <90 mL/min signify possible Chronic Kidney Disease.    Anion gap 17 (H) 5 - 15  CBC     Status: Abnormal  Collection Time: 01/22/15  5:50 PM  Result Value Ref Range   WBC 9.5 4.0 - 10.5 K/uL   RBC 4.31 3.87 - 5.11 MIL/uL   Hemoglobin 14.9 12.0 - 15.0 g/dL   HCT 42.3 36.0 - 46.0 %   MCV 98.1 78.0 - 100.0 fL   MCH 34.6 (H) 26.0 - 34.0 pg   MCHC 35.2 30.0 - 36.0 g/dL   RDW 13.6 11.5 - 15.5 %   Platelets 148 (L) 150 - 400 K/uL  Magnesium     Status: None   Collection Time: 01/22/15  5:50 PM  Result Value Ref Range   Magnesium 1.8 1.5 - 2.5 mg/dL  Phosphorus     Status: None   Collection Time: 01/22/15  5:50 PM  Result Value Ref Range   Phosphorus 3.3 2.3 - 4.6 mg/dL  I-stat troponin, ED (do not order at [facility])     Status: None   Collection Time: 01/22/15  6:07 PM  Result Value Ref Range   Troponin i, poc 0.00 0.00 - 0.08 ng/mL   Comment 3            Comment: Due to the release kinetics of cTnI, a negative result within the first hours of the onset of symptoms does not rule out myocardial infarction with certainty. If myocardial infarction is still suspected, repeat the test at appropriate intervals.   Influenza panel by PCR (type A & B, H1N1)     Status: None   Collection Time: 01/23/15  8:00 AM  Result Value Ref Range   Influenza A By PCR NEGATIVE NEGATIVE   Influenza B By PCR NEGATIVE NEGATIVE   H1N1 flu by pcr NOT DETECTED NOT DETECTED    Comment:        The Xpert Flu assay (FDA approved for nasal aspirates or washes and nasopharyngeal swab specimens), is intended as an aid in the diagnosis of influenza and should not be used as a sole  basis for treatment.   Troponin I     Status: None   Collection Time: 01/23/15 10:50 AM  Result Value Ref Range   Troponin I 0.03 <0.031 ng/mL    Comment:        NO INDICATION OF MYOCARDIAL INJURY.   Culture, blood (routine x 2)     Status: None (Preliminary result)   Collection Time: 01/23/15 12:23 PM  Result Value Ref Range   Specimen Description BLOOD RIGHT HAND    Special Requests BOTTLES DRAWN AEROBIC ONLY 4CC    Culture      GRAM POSITIVE COCCI IN CHAINS Note: Gram Stain Report Called to,Read Back By and Verified With: Sherri Rad RN on 01/24/15 at 06:55 by Rise Mu Performed at [facility]    Report Status PENDING   Troponin I     Status: None   Collection Time: 01/23/15 12:30 PM  Result Value Ref Range   Troponin I <0.03 <0.031 ng/mL    Comment:        NO INDICATION OF MYOCARDIAL INJURY.   TSH     Status: None   Collection Time: 01/23/15 12:30 PM  Result Value Ref Range   TSH 2.917 0.350 - 4.500 uIU/mL  Lactic acid, plasma     Status: Abnormal   Collection Time: 01/23/15 12:30 PM  Result Value Ref Range   Lactic Acid, Venous 3.6 (HH) 0.5 - 2.0 mmol/L    Comment: REPEATED TO VERIFY CRITICAL RESULT CALLED TO, READ BACK BY AND VERIFIED WITH: B  DAVIS,RN 04.10.16 [place]   Culture, blood (routine x 2)     Status: None (Preliminary result)   Collection Time: 01/23/15 12:30 PM  Result Value Ref Range   Specimen Description BLOOD LEFT HAND    Special Requests BOTTLES DRAWN AEROBIC ONLY 5CC    Culture             BLOOD CULTURE RECEIVED NO GROWTH TO DATE CULTURE WILL BE HELD FOR 5 DAYS BEFORE ISSUING A FINAL NEGATIVE REPORT Performed at [facility]    Report Status PENDING   Basic metabolic panel     Status: Abnormal   Collection Time: 01/23/15 12:30 PM  Result Value Ref Range   Sodium 127 (L) 135 - 145 mmol/L   Potassium 3.9 3.5 - 5.1 mmol/L   Chloride 101 96 - 112 mmol/L   CO2 13 (L) 19 - 32 mmol/L   Glucose, Bld 116 (H) 70 - 99  mg/dL   BUN 38 (H) 6 - 23 mg/dL   Creatinine, Ser 2.41 (H) 0.50 - 1.10 mg/dL   Calcium 7.3 (L) 8.4 - 10.5 mg/dL   GFR calc non Af Amer 20 (L) >90 mL/min   GFR calc Af Amer 23 (L) >90 mL/min    Comment: (NOTE) The eGFR has been calculated using the CKD EPI equation. This calculation has not been validated in all clinical situations. eGFR's persistently <90 mL/min signify possible Chronic Kidney Disease.    Anion gap 13 5 - 15  Urinalysis, Routine w reflex microscopic     Status: Abnormal   Collection Time: 01/23/15  6:14 PM  Result Value Ref Range   Color, Urine AMBER (A) YELLOW    Comment: BIOCHEMICALS MAY BE AFFECTED BY COLOR   APPearance TURBID (A) CLEAR   Specific Gravity, Urine 1.024 1.005 - 1.030   pH 5.0 5.0 - 8.0   Glucose, UA NEGATIVE NEGATIVE mg/dL   Hgb urine dipstick MODERATE (A) NEGATIVE   Bilirubin Urine SMALL (A) NEGATIVE   Ketones, ur NEGATIVE NEGATIVE mg/dL   Protein, ur 100 (A) NEGATIVE mg/dL   Urobilinogen, UA 1.0 0.0 - 1.0 mg/dL   Nitrite NEGATIVE NEGATIVE   Leukocytes, UA SMALL (A) NEGATIVE  Urine microscopic-add on     Status: Abnormal   Collection Time: 01/23/15  6:14 PM  Result Value Ref Range   Squamous Epithelial / LPF RARE RARE   WBC, UA 7-10 <3 WBC/hpf   RBC / HPF 0-2 <3 RBC/hpf   Bacteria, UA FEW (A) RARE   Urine-Other AMORPHOUS URATES/PHOSPHATES   Lactic acid, plasma     Status: Abnormal   Collection Time: 01/23/15  7:00 PM  Result Value Ref Range   Lactic Acid, Venous 3.3 (HH) 0.5 - 2.0 mmol/L    Comment: REPEATED TO VERIFY CRITICAL RESULT CALLED TO, READ BACK BY AND VERIFIED WITH: GARDENER,D 1024 01/23/15 TEETERN GARDENER,D RN   Comprehensive metabolic panel     Status: Abnormal   Collection Time: 01/24/15  5:00 AM  Result Value Ref Range   Sodium 125 (L) 135 - 145 mmol/L   Potassium 3.8 3.5 - 5.1 mmol/L   Chloride 100 96 - 112 mmol/L   CO2 18 (L) 19 - 32 mmol/L   Glucose, Bld 107 (H) 70 - 99 mg/dL   BUN 52 (H) 6 - 23 mg/dL    Creatinine, Ser 3.25 (H) 0.50 - 1.10 mg/dL   Calcium 7.0 (L) 8.4 - 10.5 mg/dL   Total Protein 5.0 (L) 6.0 - 8.3  g/dL   Albumin 2.2 (L) 3.5 - 5.2 g/dL   AST 54 (H) 0 - 37 U/L   ALT 39 (H) 0 - 35 U/L   Alkaline Phosphatase 38 (L) 39 - 117 U/L   Total Bilirubin 1.4 (H) 0.3 - 1.2 mg/dL   GFR calc non Af Amer 14 (L) >90 mL/min   GFR calc Af Amer 16 (L) >90 mL/min    Comment: (NOTE) The eGFR has been calculated using the CKD EPI equation. This calculation has not been validated in all clinical situations. eGFR's persistently <90 mL/min signify possible Chronic Kidney Disease.    Anion gap 7 5 - 15  CBC     Status: Abnormal   Collection Time: 01/24/15  5:00 AM  Result Value Ref Range   WBC 6.3 4.0 - 10.5 K/uL   RBC 3.50 (L) 3.87 - 5.11 MIL/uL   Hemoglobin 11.8 (L) 12.0 - 15.0 g/dL    Comment: DELTA CHECK NOTED REPEATED TO VERIFY    HCT 33.4 (L) 36.0 - 46.0 %   MCV 95.4 78.0 - 100.0 fL   MCH 33.7 26.0 - 34.0 pg   MCHC 35.3 30.0 - 36.0 g/dL   RDW 13.6 11.5 - 15.5 %   Platelets 84 (L) 150 - 400 K/uL    Comment: REPEATED TO VERIFY PLATELET COUNT CONFIRMED BY SMEAR   Lactic acid, plasma     Status: None   Collection Time: 01/24/15 10:45 AM  Result Value Ref Range   Lactic Acid, Venous 1.4 0.5 - 2.0 mmol/L  Blood gas, arterial     Status: Abnormal   Collection Time: 01/24/15 11:30 AM  Result Value Ref Range   O2 Content 3.5 L/min   pH, Arterial 7.386 7.350 - 7.450   pCO2 arterial 19.9 (LL) 35.0 - 45.0 mmHg    Comment: CRITICAL RESULT CALLED TO, READ BACK BY AND VERIFIED WITH: DEBBIE BRITT RN AT 11:37 CALLED, BY NIKKI NELSON RRT 01/24/2015    pO2, Arterial 90.8 80.0 - 100.0 mmHg   Bicarbonate 11.6 (L) 20.0 - 24.0 mEq/L   TCO2 12.3 0 - 100 mmol/L   Acid-base deficit 12.5 (H) 0.0 - 2.0 mmol/L   O2 Saturation 96.2 %   Patient temperature 98.6    Collection site RIGHT RADIAL    Drawn by [id number]    Sample type ARTERIAL DRAW    Allens test (pass/fail) PASS PASS    @BRIEFLABTABLE (sdes,specrequest,cult,reptstatus)   ) Recent Results (from the past 720 hour(s))  Culture, blood (routine x 2)     Status: None (Preliminary result)   Collection Time: 01/23/15 12:23 PM  Result Value Ref Range Status   Specimen Description BLOOD RIGHT HAND  Final   Special Requests BOTTLES DRAWN AEROBIC ONLY 4CC  Final   Culture   Final    GRAM POSITIVE COCCI IN CHAINS Note: Gram Stain Report Called to,Read Back By and Verified With: Sherri Rad RN on 01/24/15 at 06:55 by Rise Mu Performed at [facility]    Report Status PENDING  Incomplete  Culture, blood (routine x 2)     Status: None (Preliminary result)   Collection Time: 01/23/15 12:30 PM  Result Value Ref Range Status   Specimen Description BLOOD LEFT HAND  Final   Special Requests BOTTLES DRAWN AEROBIC ONLY 5CC  Final   Culture   Final           BLOOD CULTURE RECEIVED NO GROWTH TO DATE CULTURE WILL BE HELD FOR 5 DAYS BEFORE ISSUING  A FINAL NEGATIVE REPORT Performed at [facility]    Report Status PENDING  Incomplete     Impression/Recommendation  Principal Problem:   Atrial fibrillation with RVR Active Problems:   Palpitations   Upper respiratory disease   Dehydration   Breast pain, right   A-fib   Fever   Nausea & vomiting   Sepsis   Acute renal insufficiency   Amanda Wilkins is a 69 y.o. female with admission with fevers, axillary LN pain, breast pain ARF, TTpenia, now with apparent clinical worsening on vancomycin and zosyn with worsenign renal fxn, tachypnea, TTPenia   #1 Severe Sepsis with shock:  Her right axillary area would seem to be focal point but very curious that is not as overtly inflamed at the bedside as I would expect in terms of erythema. She certainly is tender in this area. She does not have any clear. On areas on exam and CT scan does not disclose an abscess. She is obese and certainly this can cause imaging such as CT scans without contrast  under perform. She is not having any cats or kittens are been scratched by any such animal recently does not have dogs has not noted any tick bites.   She does have gram-positive cocci in pairs growing from one blood culture but not clear if this is a true pathogen or a contaminant.  I have some concern for less likely culprits besides her axillary pathology including TTP/HUS, Rickettsial disease, intra-bdominal infection  --I have added doxycycline to her regimen to cover for RMSF, ERhlichia though I suspect her condition is more likely due to a severe infection in the left axilla  --I will order stat blood smear, LDH, haptoglobin, Coagulation studies  --I will check an HIV antibody, HCV antibody  I think getting CT abdomen without IV contrast not unreasonable in looking for a source if we do not believe it to be her abdomen. IF we either do not get CT abdomen or CT abdomen not showing intradominal pathology would DC her Gram negative coverage  --repeat blood cultures  --check US of the breast and axilla       01/24/2015, 3:52 PM   Thank you so much for this interesting consult  [facility] for [place] [phone] (pager) [phone] (office) 01/24/2015, 3:52 PM  [place] 01/24/2015, 3:52 PM

## 2015-01-24 NOTE — Progress Notes (Addendum)
[facility] PROGRESS NOTE  Amanda Wilkins EKC:[id number] DOB: Amanda Wilkins DOA: 01/22/2015 PCP: [name], MD  Assessment/Plan: 1. Sepsis -Blood cx with GPC in pairs 1/2 -source unclear, Flu PCR negative, UTI also possible, FU urine Cx -cut down NS to 75cc/hr -due to Dyspnea today also check Stat CXR -check Ct abd pelvis-reports mild pain today -continue Vanc and Zosyn-started 4/10am -lactic acid improving, repeat lactic acid now -i got a Ct chest wall/axilla due to area of tenderness and induration, small area of induration, no abscess -will consult ID   2. Afib with RVR -due to 1 -IVF, now on cardizem gtt, also on Atenolol as BP tolerated -on ASA, not on anticoagulation -Cards following  3. AKI -due to Sepsis, hypotension, stopped NSAID on admission -continue IVF -monitor urine output, check Renal US -will ask Renal to see -caution with Vanc dosing  4. Suspected CAD -recent abnormal Stress test -continue ASA, Atenolol as BP tolerates -cards following  5. Dyspnea -due to Sepsis, could also have pulm edema , didn't have crackles this am -check STAT CXR -ABG if worsens  DVT proph: lovenox  Code Status: Full Code Family Communication: spouse  at bedside Disposition Plan: Tx to SDU  Consultants:  Cards  HPI/Subjective: C/o dyspnea, R axilla pain improving, fevers down  Objective: Filed Vitals:   01/24/15 0425  BP: 115/83  Pulse: 108  Temp: 98.4 F (36.9 C)  Resp: 24    Intake/Output Summary (Last 24 hours) at 01/24/15 0833 Last data filed at 01/23/15 1845  Gross per 24 hour  Intake    480 ml  Output      2 ml  Net    478 ml   Filed Weights   01/23/15 0022 01/23/15 0400  Weight: 105.597 kg (232 lb 12.8 oz) 105.597 kg (232 lb 12.8 oz)    Exam:   General: ill appearing, uncomfortable  Cardiovascular: S1S2/irregular rate and rhythm  Respiratory: poor air movt B/l  Abdomen: soft, obese, NT, BS present  Musculoskeletal: no edema  c/c  R axilla/chest wall/breast with tenderness and small area of induration which is less tender than yetserday, no erythema noted  Data Reviewed: Basic Metabolic Panel:  Recent Labs Lab 01/22/15 1750 01/23/15 1230 01/24/15 0500  NA 127* 127* 125*  K 3.8 3.9 3.8  CL 94* 101 100  CO2 16* 13* 18*  GLUCOSE 156* 116* 107*  BUN 32* 38* 52*  CREATININE 1.97* 2.41* 3.25*  CALCIUM 8.6 7.3* 7.0*  MG 1.8  --   --   PHOS 3.3  --   --    Liver Function Tests:  Recent Labs Lab 01/24/15 0500  AST 54*  ALT 39*  ALKPHOS 38*  BILITOT 1.4*  PROT 5.0*  ALBUMIN 2.2*   No results for input(s): LIPASE, AMYLASE in the last 168 hours. No results for input(s): AMMONIA in the last 168 hours. CBC:  Recent Labs Lab 01/22/15 1750 01/24/15 0500  WBC 9.5 6.3  HGB 14.9 11.8*  HCT 42.3 33.4*  MCV 98.1 95.4  PLT 148* PENDING   Cardiac Enzymes:  Recent Labs Lab 01/23/15 1050 01/23/15 1230  TROPONINI 0.03 <0.03   BNP (last 3 results) No results for input(s): BNP in the last 8760 hours.  ProBNP (last 3 results)  Recent Labs  09/22/14 1755 11/11/14 0943  PROBNP 479.7* 173.0*    CBG: No results for input(s): GLUCAP in the last 168 hours.  Recent Results (from the past 240 hour(s))  Culture, blood (routine x 2)  Status: None (Preliminary result)   Collection Time: 01/23/15 12:23 PM  Result Value Ref Range Status   Specimen Description BLOOD RIGHT HAND  Final   Special Requests BOTTLES DRAWN AEROBIC ONLY 4CC  Final   Culture   Final    GRAM POSITIVE COCCI IN CHAINS Note: Gram Stain Report Called to,Read Back By and Verified With: Sherri Rad RN on 01/24/15 at 06:55 by Rise Mu Performed at [facility]    Report Status PENDING  Incomplete  Culture, blood (routine x 2)     Status: None (Preliminary result)   Collection Time: 01/23/15 12:30 PM  Result Value Ref Range Status   Specimen Description BLOOD LEFT HAND  Final   Special Requests BOTTLES DRAWN  AEROBIC ONLY 5CC  Final   Culture   Final           BLOOD CULTURE RECEIVED NO GROWTH TO DATE CULTURE WILL BE HELD FOR 5 DAYS BEFORE ISSUING A FINAL NEGATIVE REPORT Performed at [facility]    Report Status PENDING  Incomplete     Studies: Dg Chest 2 View  01/22/2015   CLINICAL DATA:  Atrial fibrillation. Shortness of breath with exertion. Right axillary pain. No injury.  EXAM: CHEST  2 VIEW  COMPARISON:  09/22/2014  FINDINGS: Normal heart size and pulmonary vascularity. Perihilar interstitial changes suggesting chronic bronchitis. Linear atelectasis or scarring in the lung bases. No focal consolidation. No blunting of costophrenic angles. No pneumothorax. Mediastinal contours appear intact. Calcified aorta. Mild degenerative changes in the spine.  IMPRESSION: Chronic bronchitic changes in the lungs with slight fibrosis or linear atelectasis in the lung bases. No focal consolidation.   Electronically Signed   By: Lucienne Capers M.D.   On: 01/22/2015 23:46   Ct Chest Wo Contrast  01/23/2015   CLINICAL DATA:  69 year old female with right-sided chest and axillary pain. Sepsis. Fever for 1-2 weeks.  EXAM: CT CHEST WITHOUT CONTRAST  TECHNIQUE: Multidetector CT imaging of the chest was performed following the standard protocol without IV contrast.  COMPARISON:  01/22/2015 chest radiograph. Chest CT report dated 09/22/2014. Images are not available at this time due to PACS downtime.  FINDINGS: Mediastinum/Nodes: The heart and great vessels are unremarkable. There is no evidence of thoracic aortic aneurysm. No pleural or pericardial effusions are identified. No enlarged lymph nodes are noted.  Lungs/Pleura: Mild dependent and basilar atelectasis noted. There is no evidence of airspace disease, consolidation, suspicious nodule, mass or endobronchial/endotracheal lesion.  Upper abdomen: Hepatic steatosis identified.  Musculoskeletal: Right axillary inflammation is identified extending inferiorly along  the right chest. There is no evidence of gas or abscess. No acute or suspicious bony abnormalities are identified.  IMPRESSION: Inflammation in the right axilla extending inferiorly along the right chest -suspicious for infection given clinical history. No evidence of focal abscess or gas.  Mild dependent/basilar atelectasis.   Electronically Signed   By: Margarette Canada M.D.   On: 01/23/2015 11:12   Dg Chest Port 1 View  01/23/2015   CLINICAL DATA:  Shortness of breath.  EXAM: PORTABLE CHEST - 1 VIEW  COMPARISON:  Same day.  FINDINGS: The heart size and mediastinal contours are within normal limits. Both lungs are clear. No pneumothorax or pleural effusion is noted. Left-sided PICC line is noted with tip in expected position of cavoatrial junction. The visualized skeletal structures are unremarkable.  IMPRESSION: No acute cardiopulmonary abnormality seen.   Electronically Signed   By: Marijo Conception, M.D.   On:  01/23/2015 18:52   Dg Chest Port 1 View  01/23/2015   CLINICAL DATA:  PICC line placement  EXAM: PORTABLE CHEST - 1 VIEW  COMPARISON:  CT scan of the chest same day  FINDINGS: Cardiomediastinal silhouette is unremarkable. No acute infiltrate or pulmonary edema. There is a left arm PICC line with tip in right atrium. For distal SVC position the PICC line should be retracted about 1 cm. No pneumothorax.  IMPRESSION: Left arm PICC line with tip in right atrium. For distal SVC position PICC line should be retracted about 1 cm. No pneumothorax.   Electronically Signed   By: Lahoma Crocker M.D.   On: 01/23/2015 17:45    Scheduled Meds: . aspirin EC  325 mg Oral Daily  . atenolol  50 mg Oral Daily  . enoxaparin (LOVENOX) injection  30 mg Subcutaneous Q24H  . estrogens (conjugated)  0.3 mg Oral Q48H  . LORazepam  1 mg Oral Once  . piperacillin-tazobactam (ZOSYN)  IV  3.375 g Intravenous Q8H  . sertraline  50 mg Oral Daily  . sodium chloride  3 mL Intravenous Q12H  . [START ON 01/25/2015] vancomycin  1,500  mg Intravenous Q48H   Continuous Infusions: . sodium chloride 100 mL/hr (01/24/15 0745)  . diltiazem (CARDIZEM) infusion 5 mg/hr (01/24/15 0640)   Antibiotics Given (last 72 hours)    Date/Time Action Medication Dose Rate   01/23/15 0939 Given   oseltamivir (TAMIFLU) capsule 30 mg 30 mg    01/23/15 1201 Given   vancomycin (VANCOCIN) 2,000 mg in sodium chloride 0.9 % 500 mL IVPB 2,000 mg 250 mL/hr   01/23/15 1943 Given  [had to wait for PICC to be inserted]   piperacillin-tazobactam (ZOSYN) IVPB 3.375 g 3.375 g 100 mL/hr   01/23/15 2330 Given   piperacillin-tazobactam (ZOSYN) IVPB 3.375 g 3.375 g 12.5 mL/hr   01/24/15 6803 Given   piperacillin-tazobactam (ZOSYN) IVPB 3.375 g 3.375 g 12.5 mL/hr      Principal Problem:   Atrial fibrillation with RVR Active Problems:   Palpitations   Upper respiratory disease   Dehydration   Breast pain, right   A-fib   Fever   Nausea & vomiting   Sepsis   Acute renal insufficiency    Time spent: 29min   [facility] Pager [phone]. If 7PM-7AM, please contact night-coverage at [email or web address], password [facility] 01/24/2015, 8:33 AM  LOS: 1 day

## 2015-01-25 ENCOUNTER — Encounter (HOSPITAL_COMMUNITY): Payer: Self-pay | Admitting: General Surgery

## 2015-01-25 ENCOUNTER — Inpatient Hospital Stay (HOSPITAL_COMMUNITY): Payer: Medicare Other

## 2015-01-25 DIAGNOSIS — N179 Acute kidney failure, unspecified: Secondary | ICD-10-CM | POA: Diagnosis present

## 2015-01-25 DIAGNOSIS — R6521 Severe sepsis with septic shock: Secondary | ICD-10-CM

## 2015-01-25 DIAGNOSIS — R0902 Hypoxemia: Secondary | ICD-10-CM

## 2015-01-25 DIAGNOSIS — I2699 Other pulmonary embolism without acute cor pulmonale: Secondary | ICD-10-CM

## 2015-01-25 DIAGNOSIS — M79629 Pain in unspecified upper arm: Secondary | ICD-10-CM

## 2015-01-25 DIAGNOSIS — B950 Streptococcus, group A, as the cause of diseases classified elsewhere: Secondary | ICD-10-CM | POA: Diagnosis present

## 2015-01-25 DIAGNOSIS — R0600 Dyspnea, unspecified: Secondary | ICD-10-CM

## 2015-01-25 DIAGNOSIS — A419 Sepsis, unspecified organism: Secondary | ICD-10-CM | POA: Diagnosis present

## 2015-01-25 DIAGNOSIS — R0602 Shortness of breath: Secondary | ICD-10-CM

## 2015-01-25 DIAGNOSIS — L0390 Cellulitis, unspecified: Secondary | ICD-10-CM | POA: Diagnosis present

## 2015-01-25 DIAGNOSIS — D65 Disseminated intravascular coagulation [defibrination syndrome]: Secondary | ICD-10-CM | POA: Diagnosis present

## 2015-01-25 DIAGNOSIS — D696 Thrombocytopenia, unspecified: Secondary | ICD-10-CM

## 2015-01-25 DIAGNOSIS — K8010 Calculus of gallbladder with chronic cholecystitis without obstruction: Secondary | ICD-10-CM

## 2015-01-25 DIAGNOSIS — R740 Nonspecific elevation of levels of transaminase and lactic acid dehydrogenase [LDH]: Secondary | ICD-10-CM

## 2015-01-25 DIAGNOSIS — K819 Cholecystitis, unspecified: Secondary | ICD-10-CM | POA: Diagnosis present

## 2015-01-25 DIAGNOSIS — I4891 Unspecified atrial fibrillation: Secondary | ICD-10-CM

## 2015-01-25 LAB — CBC
HCT: 32.9 % — ABNORMAL LOW (ref 36.0–46.0)
HEMOGLOBIN: 11.7 g/dL — AB (ref 12.0–15.0)
MCH: 33.4 pg (ref 26.0–34.0)
MCHC: 35.6 g/dL (ref 30.0–36.0)
MCV: 94 fL (ref 78.0–100.0)
PLATELETS: 80 10*3/uL — AB (ref 150–400)
RBC: 3.5 MIL/uL — AB (ref 3.87–5.11)
RDW: 13.7 % (ref 11.5–15.5)
WBC: 12.2 10*3/uL — AB (ref 4.0–10.5)

## 2015-01-25 LAB — COMPREHENSIVE METABOLIC PANEL
ALK PHOS: 41 U/L (ref 39–117)
ALT: 37 U/L — AB (ref 0–35)
ANION GAP: 15 (ref 5–15)
AST: 38 U/L — ABNORMAL HIGH (ref 0–37)
Albumin: 2 g/dL — ABNORMAL LOW (ref 3.5–5.2)
BILIRUBIN TOTAL: 1.2 mg/dL (ref 0.3–1.2)
BUN: 66 mg/dL — AB (ref 6–23)
CHLORIDE: 97 mmol/L (ref 96–112)
CO2: 13 mmol/L — ABNORMAL LOW (ref 19–32)
Calcium: 7.2 mg/dL — ABNORMAL LOW (ref 8.4–10.5)
Creatinine, Ser: 4.06 mg/dL — ABNORMAL HIGH (ref 0.50–1.10)
GFR calc Af Amer: 12 mL/min — ABNORMAL LOW (ref >90)
GFR calc non Af Amer: 10 mL/min — ABNORMAL LOW (ref >90)
GLUCOSE: 92 mg/dL (ref 70–99)
POTASSIUM: 3.8 mmol/L (ref 3.5–5.1)
SODIUM: 125 mmol/L — AB (ref 135–145)
TOTAL PROTEIN: 5 g/dL — AB (ref 6.0–8.3)

## 2015-01-25 LAB — LACTATE DEHYDROGENASE: LDH: 254 U/L — ABNORMAL HIGH (ref 94–250)

## 2015-01-25 LAB — HIV ANTIBODY (ROUTINE TESTING W REFLEX): HIV Screen 4th Generation wRfx: NONREACTIVE

## 2015-01-25 MED ORDER — DEXTROSE 5 % IV SOLN
0.0000 ug/min | INTRAVENOUS | Status: DC
  Administered 2015-01-25: 20 ug/min via INTRAVENOUS
  Filled 2015-01-25: qty 4

## 2015-01-25 MED ORDER — SODIUM CHLORIDE 0.9 % IV SOLN: 250.0000 mL | INTRAVENOUS | Status: DC | PRN

## 2015-01-25 MED ORDER — CETYLPYRIDINIUM CHLORIDE 0.05 % MT LIQD
7.0000 mL | Freq: Two times a day (BID) | OROMUCOSAL | Status: DC
  Administered 2015-01-25 – 2015-01-29 (×8): 7 mL via OROMUCOSAL

## 2015-01-25 MED ORDER — SODIUM BICARBONATE 8.4 % IV SOLN
INTRAVENOUS | Status: DC
  Administered 2015-01-26 – 2015-01-28 (×3): via INTRAVENOUS
  Filled 2015-01-25 (×5): qty 150

## 2015-01-25 MED ORDER — DEXTROSE 5 % IV SOLN
INTRAVENOUS | Status: DC
  Administered 2015-01-25: 12:00:00 via INTRAVENOUS
  Filled 2015-01-25: qty 150

## 2015-01-25 MED ORDER — CHLORHEXIDINE GLUCONATE 0.12 % MT SOLN
15.0000 mL | Freq: Two times a day (BID) | OROMUCOSAL | Status: DC
  Administered 2015-01-25 – 2015-01-29 (×9): 15 mL via OROMUCOSAL
  Filled 2015-01-25 (×12): qty 15

## 2015-01-25 MED ORDER — CLINDAMYCIN PHOSPHATE 900 MG/50ML IV SOLN
900.0000 mg | Freq: Three times a day (TID) | INTRAVENOUS | Status: DC
  Administered 2015-01-25 – 2015-01-30 (×15): 900 mg via INTRAVENOUS
  Filled 2015-01-25 (×17): qty 50

## 2015-01-25 MED ORDER — MAGNESIUM SULFATE 2 GM/50ML IV SOLN
2.0000 g | Freq: Once | INTRAVENOUS | Status: AC
  Administered 2015-01-25: 2 g via INTRAVENOUS
  Filled 2015-01-25: qty 50

## 2015-01-25 MED ORDER — STERILE WATER FOR INJECTION IV SOLN
150.0000 meq | INTRAVENOUS | Status: DC
  Filled 2015-01-25: qty 850

## 2015-01-25 MED ORDER — HEPARIN SODIUM (PORCINE) 5000 UNIT/ML IJ SOLN
5000.0000 [IU] | Freq: Three times a day (TID) | INTRAMUSCULAR | Status: DC
  Administered 2015-01-25 – 2015-02-04 (×31): 5000 [IU] via SUBCUTANEOUS
  Filled 2015-01-25 (×40): qty 1

## 2015-01-25 MED ORDER — CETYLPYRIDINIUM CHLORIDE 0.05 % MT LIQD: 7.0000 mL | Freq: Two times a day (BID) | OROMUCOSAL | Status: DC

## 2015-01-25 MED ORDER — IPRATROPIUM-ALBUTEROL 0.5-2.5 (3) MG/3ML IN SOLN
3.0000 mL | RESPIRATORY_TRACT | Status: DC
  Administered 2015-01-25 – 2015-01-27 (×13): 3 mL via RESPIRATORY_TRACT
  Filled 2015-01-25 (×14): qty 3

## 2015-01-25 NOTE — Progress Notes (Signed)
Patient having transient BP fluctuations thought to be the result of inconsistent NIBP cuff readings. Consent obtained for arterial line and attempted to place in R radial artery and was unsuccessful, RT attempted as well without success. WIll continue to monitor and if BP remains inconsistent will consider femoral art line placement.   Georgann Housekeeper, AGACNP-BC [facility] Pager [phone] or [phone]  01/25/2015 4:16 AM

## 2015-01-25 NOTE — Progress Notes (Addendum)
[facility] for Infectious Disease    Subjective:  Pt c/o RUQ pain still with pain in axilla going from armpit down flank  Antibiotics:  Anti-infectives    Start     Dose/Rate Route Frequency Ordered Stop   01/25/15 1200  vancomycin (VANCOCIN) 1,500 mg in sodium chloride 0.9 % 500 mL IVPB     1,500 mg 250 mL/hr over 120 Minutes Intravenous Every 48 hours 01/23/15 1417     01/24/15 1600  doxycycline (VIBRAMYCIN) 100 mg in dextrose 5 % 250 mL IVPB     100 mg 125 mL/hr over 120 Minutes Intravenous Every 12 hours 01/24/15 1500     01/24/15 1400  piperacillin-tazobactam (ZOSYN) IVPB 2.25 g     2.25 g 100 mL/hr over 30 Minutes Intravenous 4 times per day 01/24/15 1148     01/24/15 1000  vancomycin (VANCOCIN) IVPB 1000 mg/200 mL premix  Status:  Discontinued     1,000 mg 200 mL/hr over 60 Minutes Intravenous Every 24 hours 01/23/15 0846 01/23/15 1417   01/23/15 2200  piperacillin-tazobactam (ZOSYN) IVPB 3.375 g  Status:  Discontinued     3.375 g 12.5 mL/hr over 240 Minutes Intravenous Every 8 hours 01/23/15 0846 01/24/15 1148   01/23/15 1000  oseltamivir (TAMIFLU) capsule 30 mg  Status:  Discontinued     30 mg Oral 2 times daily 01/23/15 0803 01/23/15 1413   01/23/15 0900  vancomycin (VANCOCIN) 2,000 mg in sodium chloride 0.9 % 500 mL IVPB     2,000 mg 250 mL/hr over 120 Minutes Intravenous  Once 01/23/15 0846 01/23/15 1401   01/23/15 0845  piperacillin-tazobactam (ZOSYN) IVPB 3.375 g     3.375 g 100 mL/hr over 30 Minutes Intravenous  Once 01/23/15 0846 01/23/15 2013      Medications: Scheduled Meds: . antiseptic oral rinse  7 mL Mouth Rinse q12n4p  . aspirin EC  325 mg Oral Daily  . chlorhexidine  15 mL Mouth Rinse BID  . doxycycline (VIBRAMYCIN) IV  100 mg Intravenous Q12H  . estrogens (conjugated)  0.3 mg Oral Q48H  . heparin  5,000 Units Subcutaneous 3 times per day  . ipratropium-albuterol  3 mL Nebulization Q4H  . LORazepam  1 mg Oral Once  .  piperacillin-tazobactam (ZOSYN)  IV  2.25 g Intravenous 4 times per day  . sertraline  50 mg Oral Daily  . sodium chloride  3 mL Intravenous Q12H  . vancomycin  1,500 mg Intravenous Q48H   Continuous Infusions: . sodium chloride 10 mL/hr at 01/25/15 0800  . diltiazem (CARDIZEM) infusion Stopped (01/24/15 2226)  . phenylephrine (NEO-SYNEPHRINE) Adult infusion 40 mcg/min (01/25/15 0800)   PRN Meds:.sodium chloride, acetaminophen **OR** acetaminophen, guaifenesin, levalbuterol, loperamide, ondansetron **OR** ondansetron (ZOFRAN) IV, sodium chloride    Objective: Weight change:   Intake/Output Summary (Last 24 hours) at 01/25/15 1004 Last data filed at 01/25/15 0800  Gross per 24 hour  Intake 1332.05 ml  Output    675 ml  Net 657.05 ml   Blood pressure 109/77, pulse 128, temperature 97.6 F (36.4 C), temperature source Oral, resp. rate 27, height 5\' 6"  (1.676 m), weight 247 lb 9.2 oz (112.3 kg), SpO2 100 %. Temp:  [97.2 F (36.2 C)-97.9 F (36.6 C)] 97.6 F (36.4 C) (04/12 0719) Pulse Rate:  [35-155] 128 (04/12 0815) Resp:  [21-36] 27 (04/12 0815) BP: (72-156)/(25-121) 109/77 mmHg (04/12 0815) SpO2:  [91 %-100 %] 100 % (04/12 0815) Weight:  [247 lb 9.2 oz (112.3 kg)] 247  lb 9.2 oz (112.3 kg) (04/12 0500)  Physical Exam: General: Alert and awake, but was initially biting at her O2 probe HEENT: anicteric sclera, pupils reactive to light and accommodation, EOMI CVS tachy rate, irr, irr  no murmur rubs or gallops Chest: clear to auscultation bilaterally, no wheezing, rales or rhonchi Abdomen: soft TTP in RUQ, no rebound  Lymph/skin: she has tenderness to palpation along axilla down flank to near RUQ, also some tenderness in breast tissue Neuro: nonfocal  CBC:  CBC Latest Ref Rng 01/25/2015 01/24/2015 01/24/2015  WBC 4.0 - 10.5 K/uL 12.2(H) 9.0 -  Hemoglobin 12.0 - 15.0 g/dL 11.7(L) 11.6(L) -  Hematocrit 36.0 - 46.0 % 32.9(L) 32.4(L) -  Platelets 150 - 400 K/uL 80(L) 73(L)  74(L)       BMET  Recent Labs  01/24/15 0500 01/25/15 0500  NA 125* 125*  K 3.8 3.8  CL 100 97  CO2 18* 13*  GLUCOSE 107* 92  BUN 52* 66*  CREATININE 3.25* 4.06*  CALCIUM 7.0* 7.2*     Liver Panel   Recent Labs  01/24/15 0500 01/25/15 0500  PROT 5.0* 5.0*  ALBUMIN 2.2* 2.0*  AST 54* 38*  ALT 39* 37*  ALKPHOS 38* 41  BILITOT 1.4* 1.2       Sedimentation Rate No results for input(s): ESRSEDRATE in the last 72 hours. C-Reactive Protein No results for input(s): CRP in the last 72 hours.  Micro Results: Recent Results (from the past 720 hour(s))  Culture, blood (routine x 2)     Status: None (Preliminary result)   Collection Time: 01/23/15 12:23 PM  Result Value Ref Range Status   Specimen Description BLOOD RIGHT HAND  Final   Special Requests BOTTLES DRAWN AEROBIC ONLY 4CC  Final   Culture   Final    GROUP A STREP (S.PYOGENES) ISOLATED Note: CRITICAL RESULT CALLED TO, READ BACK BY AND VERIFIED WITH: CHRISTINE HINSHAW @ 1287 ON [id number] BY [place] Note: Gram Stain Report Called to,Read Back By and Verified With: Sherri Rad RN on 01/24/15 at 06:55 by Rise Mu Performed at [facility]    Report Status PENDING  Incomplete  Culture, blood (routine x 2)     Status: None (Preliminary result)   Collection Time: 01/23/15 12:30 PM  Result Value Ref Range Status   Specimen Description BLOOD LEFT HAND  Final   Special Requests BOTTLES DRAWN AEROBIC ONLY 5CC  Final   Culture   Final           BLOOD CULTURE RECEIVED NO GROWTH TO DATE CULTURE WILL BE HELD FOR 5 DAYS BEFORE ISSUING A FINAL NEGATIVE REPORT Performed at [facility]    Report Status PENDING  Incomplete  Clostridium Difficile by PCR     Status: None   Collection Time: 01/24/15 12:59 PM  Result Value Ref Range Status   C difficile by pcr NEGATIVE NEGATIVE Final  MRSA PCR Screening     Status: None   Collection Time: 01/24/15  4:30 PM  Result Value Ref Range Status   MRSA by PCR  NEGATIVE NEGATIVE Final    Comment:        The GeneXpert MRSA Assay (FDA approved for NASAL specimens only), is one component of a comprehensive MRSA colonization surveillance program. It is not intended to diagnose MRSA infection nor to guide or monitor treatment for MRSA infections.     Studies/Results: Ct Abdomen Pelvis Wo Contrast  01/24/2015   CLINICAL DATA:  Abdominal pain, nausea, vomiting,  diarrhea. Fever and chills. Sepsis.  EXAM: CT ABDOMEN AND PELVIS WITHOUT CONTRAST  TECHNIQUE: Multidetector CT imaging of the abdomen and pelvis was performed following the standard protocol without IV contrast.  COMPARISON:  No similar prior exam is available at this institution for comparison or on [facility].  FINDINGS: Lower chest: Small bilateral pleural effusions are noted. Interlobular septal thickening and bronchial wall thickening are suggestive of interstitial pulmonary edema.  Hepatobiliary: Hepatic hypodensity suggests steatosis. No focal hepatic abnormality or intrahepatic ductal dilatation. Gallbladder distention with probable dependent stone image 34.  Pancreas: Normal  Spleen: Normal  Adrenals/Urinary Tract: Adrenal glands appear normal. Moderate nonspecific bilateral perinephric stranding is identified. No hydroureteronephrosis. A Foley catheter is in place and the bladder is decompressed. No radiopaque renal or ureteral calculus. Renal parenchymal detail is obscured by lack of contrast.  Stomach/Bowel: Stomach appears normal. Normal appendix. No bowel wall thickening or focal segmental dilatation is identified.  Vascular/Lymphatic: No lymphadenopathy. Mild atheromatous aortic calcification identified.  Reproductive: Uterus and ovaries are unremarkable.  Other: Mild soft tissue anasarca is present which may suggest fluid overload. No free air or free intra-abdominal fluid.  Musculoskeletal: No acute osseous abnormality. L5-S1 disc degenerative change.  IMPRESSION: Findings at the lung  bases suggest interstitial pulmonary edema with mild soft tissue anasarca.  Nonspecific bilateral perinephric stranding without hydroureteronephrosis or radiopaque renal or ureteral calculus.  Gallbladder distention with possible internal gallstone identified. This again raises the question of possible cholecystitis in the setting of sepsis.   Electronically Signed   By: Conchita Paris M.D.   On: 01/24/2015 21:04   US Renal  01/24/2015   CLINICAL DATA:  Acute renal insufficiency.  EXAM: RENAL/URINARY TRACT ULTRASOUND COMPLETE  COMPARISON:  11/16/2009  FINDINGS: Right Kidney:  Length: 10.1 cm. Echogenicity within normal limits. No mass or hydronephrosis visualized.  Left Kidney:  Length: 10.4 cm. No hydronephrosis. Mild perinephric edema or trace fluid adjacent the lower pole left kidney.  Bladder:  Collapsed around a Foley catheter.  Exam degraded by patient body habitus. Incidental note is made of a 2.6 cm gallstone and a left-sided pleural effusion.  IMPRESSION: 1.  No hydronephrosis.  No explanation for renal insufficiency. 2. Decreased sensitivity and specificity exam due to technique related factors, as described above. 3. Cholelithiasis. 4. Small left pleural effusion.   Electronically Signed   By: Abigail Miyamoto M.D.   On: 01/24/2015 10:56   Dg Chest Port 1 View  01/24/2015   CLINICAL DATA:  Wheezing and shortness of breath.  EXAM: PORTABLE CHEST - 1 VIEW  COMPARISON:  01/24/2015  FINDINGS: Left-sided PICC line tip overlies the superior vena cava. Heart is mildly prominent. There Kerley B-lines consistent with pulmonary edema. No focal consolidation. No overt alveolar edema.  IMPRESSION: 1. Mild interstitial edema. 2. Left-sided PICC line tip to the superior vena cava.   Electronically Signed   By: Nolon Nations M.D.   On: 01/24/2015 22:14   Dg Chest Port 1 View  01/24/2015   CLINICAL DATA:  Hypoxia, pt was admitted with an acute URI and right armpit infection and was found to be in a-fib with RVR,  hypovolemic, hyponatremic with an acute renal failure.  EXAM: PORTABLE CHEST - 1 VIEW  COMPARISON:  Radiograph 01/23/2015  FINDINGS: Left-sided PICC line with tip in distal SVC. Stable cardiac silhouette. There is mild central venous congestion. No focal infiltrate. No pulmonary edema. Mild basilar atelectasis.  IMPRESSION: Mild basilar atelectasis and minimal central venous congestion. No change from  prior.   Electronically Signed   By: Suzy Bouchard M.D.   On: 01/24/2015 09:44   Dg Chest Port 1 View  01/23/2015   CLINICAL DATA:  Shortness of breath.  EXAM: PORTABLE CHEST - 1 VIEW  COMPARISON:  Same day.  FINDINGS: The heart size and mediastinal contours are within normal limits. Both lungs are clear. No pneumothorax or pleural effusion is noted. Left-sided PICC line is noted with tip in expected position of cavoatrial junction. The visualized skeletal structures are unremarkable.  IMPRESSION: No acute cardiopulmonary abnormality seen.   Electronically Signed   By: Marijo Conception, M.D.   On: 01/23/2015 18:52   Dg Chest Port 1 View  01/23/2015   CLINICAL DATA:  PICC line placement  EXAM: PORTABLE CHEST - 1 VIEW  COMPARISON:  CT scan of the chest same day  FINDINGS: Cardiomediastinal silhouette is unremarkable. No acute infiltrate or pulmonary edema. There is a left arm PICC line with tip in right atrium. For distal SVC position the PICC line should be retracted about 1 cm. No pneumothorax.  IMPRESSION: Left arm PICC line with tip in right atrium. For distal SVC position PICC line should be retracted about 1 cm. No pneumothorax.   Electronically Signed   By: Lahoma Crocker M.D.   On: 01/23/2015 17:45      Assessment/Plan:  Principal Problem:   Atrial fibrillation with RVR Active Problems:   Palpitations   Upper respiratory disease   Dehydration   Breast pain, right   A-fib   Fever   Nausea & vomiting   Sepsis   Acute renal insufficiency   Abdominal pain   Axillary pain   Dyspnea    Hypoxia   Thrombocytopenia   Transaminitis   Hyperbilirubinemia   FUO (fever of unknown origin)   Septic shock    Amanda Wilkins is a 69 y.o. female with fevers, axillary LN pain, breast pain ARF, TTpenia, yesterday with apparent clinical worsening on vancomycin and zosyn with worsenign renal fxn, tachypnea, TTPenia --> ICU. She has coag profile c/w more chronic DIC. NOW after visiting her she is found to have GROUP A strep in one of TWO blood cultures   #1 Severe Sepsis with shock:   Now found to have Group A streptococci in 1/2 blood cultures which is a more compelling pathogen to explain her pathology  I am trying to see if there is drainable abscess in her chest wall, axilla where she is tender (there was none seen on CT scan without contrast)  Dw Dr. Nyoka Cowden of Radiology and recommended [place] of chest with markers of most tender areas and [place] RUQ  Given finding of GS and ? Cholecystitis and RUQ pain I also ordered RUQ [place] AND I consulted General Surgery.  EVEN if General Surgery do not believe pt to have severe biliary infection will be helpful to have them involved for management of her soft tissue infection if this is indeed an infection that may progress to necrotizing fascitiing  I will DC doxycycline now that we have two more likely explanations soft tissue vs biliary DC vancomycin  I will add clindamycin for toxin inhibition  Keep zosyn for now in case we find that GB is still possible source, if can exclude it will narrow ceftriaxone and clindamycin  #2 Gallstones: see above discussion and consulted CCS and ordered RUQ  #3 + D Dimer, likely due to sepsis not PE, cancelled Nuc med scan after discussion with Dr. Elsworth Soho Can consider  doppler of UE and LE  I spent greater than 60 minutes with the patient including greater than 50% of time in face to face counsel of the patient concerning her sepsis and potential sources and in coordination of their care with CCM, CCS and  Radiology.     LOS: 2 days   Alcide Evener 01/25/2015, 10:04 AM

## 2015-01-25 NOTE — Progress Notes (Signed)
ANTIBIOTIC CONSULT NOTE - INITIAL  Pharmacy Consult for vancomycin/zosyn Indication: Sepsis  Allergies  Allergen Reactions  . Epinephrine Other (See Comments)    Heart rate soars  . Epinephrine Base Other (See Comments)    Seriously increases heart rate  . Aspirin Other (See Comments)    Can take the coated 325 mg. Plain asa 325 mg speeds up the heart.  . Penicillins Other (See Comments)    Pt previously has been told not to take because of family history of reactions (water blisters). She took amoxicillin in 2012-2013 with no reaction    Patient Measurements: Height: 5\' 6"  (167.6 cm) Weight: 247 lb 9.2 oz (112.3 kg) IBW/kg (Calculated) : 59.3  Vital Signs: Temp: 97.6 F (36.4 C) (04/12 0719) Temp Source: Oral (04/12 0719) BP: 109/77 mmHg (04/12 0815) Pulse Rate: 128 (04/12 0815) Intake/Output from previous day: 04/11 0701 - 04/12 0700 In: 1547.1 [P.O.:480; I.V.:767.1; IV Piggyback:300] Out: 675 [Urine:675] Intake/Output from this shift: Total I/O In: 25 [I.V.:25] Out: -   Labs:  Recent Labs  01/23/15 1230 01/24/15 0500 01/24/15 2043 01/25/15 0500  WBC  --  6.3 9.0 12.2*  HGB  --  11.8* 11.6* 11.7*  PLT  --  84* 73*  74* 80*  CREATININE 2.41* 3.25*  --  4.06*   Estimated Creatinine Clearance: 16.9 mL/min (by C-G formula based on Cr of 4.06). No results for input(s): VANCOTROUGH, VANCOPEAK, VANCORANDOM, GENTTROUGH, GENTPEAK, GENTRANDOM, TOBRATROUGH, TOBRAPEAK, TOBRARND, AMIKACINPEAK, AMIKACINTROU, AMIKACIN in the last 72 hours.   Microbiology: Recent Results (from the past 720 hour(s))  Culture, blood (routine x 2)     Status: None (Preliminary result)   Collection Time: 01/23/15 12:23 PM  Result Value Ref Range Status   Specimen Description BLOOD RIGHT HAND  Final   Special Requests BOTTLES DRAWN AEROBIC ONLY 4CC  Final   Culture   Final    GROUP A STREP (S.PYOGENES) ISOLATED Note: CRITICAL RESULT CALLED TO, READ BACK BY AND VERIFIED WITH: CHRISTINE  HINSHAW @ [id number] ON [id number] BY [place] Note: Gram Stain Report Called to,Read Back By and Verified With: Sherri Rad RN on 01/24/15 at 06:55 by Rise Mu Performed at [facility]    Report Status PENDING  Incomplete  Culture, blood (routine x 2)     Status: None (Preliminary result)   Collection Time: 01/23/15 12:30 PM  Result Value Ref Range Status   Specimen Description BLOOD LEFT HAND  Final   Special Requests BOTTLES DRAWN AEROBIC ONLY 5CC  Final   Culture   Final           BLOOD CULTURE RECEIVED NO GROWTH TO DATE CULTURE WILL BE HELD FOR 5 DAYS BEFORE ISSUING A FINAL NEGATIVE REPORT Performed at [facility]    Report Status PENDING  Incomplete  Clostridium Difficile by PCR     Status: None   Collection Time: 01/24/15 12:59 PM  Result Value Ref Range Status   C difficile by pcr NEGATIVE NEGATIVE Final  MRSA PCR Screening     Status: None   Collection Time: 01/24/15  4:30 PM  Result Value Ref Range Status   MRSA by PCR NEGATIVE NEGATIVE Final    Comment:        The GeneXpert MRSA Assay (FDA approved for NASAL specimens only), is one component of a comprehensive MRSA colonization surveillance program. It is not intended to diagnose MRSA infection nor to guide or monitor treatment for MRSA infections.     Medical History: Past  Medical History  Diagnosis Date  . Allergy   . Atrial fibrillation   . Migraines     on Zoloft for migraines  . Hypertension   . Chronic interstitial nephritis   . Depression   . Palpitations   . Obesity   . Arthritis     ON BOTH KNEES    Scheduled:  . antiseptic oral rinse  7 mL Mouth Rinse q12n4p  . aspirin EC  325 mg Oral Daily  . chlorhexidine  15 mL Mouth Rinse BID  . clindamycin (CLEOCIN) IV  900 mg Intravenous 3 times per day  . estrogens (conjugated)  0.3 mg Oral Q48H  . heparin  5,000 Units Subcutaneous 3 times per day  . ipratropium-albuterol  3 mL Nebulization Q4H  . LORazepam  1 mg Oral Once  .  piperacillin-tazobactam (ZOSYN)  IV  2.25 g Intravenous 4 times per day  . sertraline  50 mg Oral Daily  . sodium chloride  3 mL Intravenous Q12H    Assessment: 69 yo female here with n/v/d and recent fever currently on zosyn and vancomycin for possible sepsis. Now with group A strep in 1/2 bld cxs ID service has narrowed abx to zosyn and clinda.  Patient continues in ARF with scr 1.9>3>4 today. Zosyn adjusted yesterday and dose remains appropriate today. No dose adj needed for clinda.  4/10 zosyn>> 4/10 vanc>>4/12 4/12 clinda  4/10 blood 1/2 - group a strep 4/11 urine - sent  Plan:  -Zosyn 2/25gm IV q6h -d/c Vancomycin  -no change in clinda -Will follow renal function, cultures and clinical progress  Erin Hearing PharmD., BCPS Clinical Pharmacist Pager [phone] 01/25/2015 10:32 AM

## 2015-01-25 NOTE — Progress Notes (Signed)
PULMONARY / CRITICAL CARE MEDICINE   Name: Amanda Wilkins MRN: [id number] DOB: 1946-01-24    ADMISSION DATE:  01/22/2015 CONSULTATION DATE:  01/24/15  REFERRING MD :  Dr. Broadus John   CHIEF COMPLAINT:  Sepsis   INITIAL PRESENTATION: 69 y/o F admitted 4/9 with a 2 day hx of N/V/D, fever/malaise and palpitations.  PCCM consulted for evaluation 4/11.  Hypotensive overnight & asked to see again  STUDIES:  4/10 CT Chest >> inflammation in R axilla extending inferiorly along R chest, no focal evidence of abscess or gas, mild dependent atx 4/11 CT Abd/Pelvis >> no renal obs, Gallbladder distention with possible internal gallstone identified 4/11 Renal US >> no hydronephrosis, cholelithiasis, small L pleural effusion   SIGNIFICANT EVENTS: 4/09  Admit with N/V/D. Lactic acid 3.6 4/11  PCCM consulted for sepsis.  Lactic acid 1.4 4/12 hypotensive on dilt gtt, tachy on dopamine gtt, some resp distress with fluids  HISTORY OF PRESENT ILLNESS:  69 y/o F, non-smoker,  with PMH of migraines, HTN, depression, ETOH (3 drinks daily but denies w/d sx if she does not drink) obestiy, arthritis and chronic interstitial nephritis who presented 4/9 with a 2 day hx of N/V/D, fever/malaise and palpitations.  Patient reports her symptoms began on Thursday 4/7.  Her daughter recently had a viral illness similar to her presenting symptoms.  Admit work up was notable for lactic acid of 3.6, WBC 9.5 and AKI. She also complained of R axilla / breast pain and CT of chest was obtained which noted inflammation but no gas or abscess.  Blood cultures assessed and growing 1/2 with GPC's.  Renal US negative for hydronephrosis but notable for cholelithiasis & small L pleural effusion.  Pt developed tachypnea / mild SOB.  She indicates she has been on abx in the past 3 months for UTI (feels like it was in February) and symptoms initially improved but then "came back with a vengeance" (burning, frequency, urgency).  PCCM consulted for  evaluation.   SUBJECTIVE: C/o RUQ abdominal pain, surgery seeing.  VITAL SIGNS: Temp:  [97.2 F (36.2 C)-97.9 F (36.6 C)] 97.6 F (36.4 C) (04/12 0719) Pulse Rate:  [35-155] 128 (04/12 0815) Resp:  [21-36] 27 (04/12 0815) BP: (72-156)/(25-121) 109/77 mmHg (04/12 0815) SpO2:  [91 %-100 %] 100 % (04/12 0815) Weight:  [112.3 kg (247 lb 9.2 oz)] 112.3 kg (247 lb 9.2 oz) (04/12 0500)   HEMODYNAMICS:    VENTILATOR SETTINGS:      INTAKE / OUTPUT:  Intake/Output Summary (Last 24 hours) at 01/25/15 1053 Last data filed at 01/25/15 0800  Gross per 24 hour  Intake 1332.05 ml  Output    675 ml  Net 657.05 ml   PHYSICAL EXAMINATION: General:  Obese female in NAD Neuro:  AAOx4, speech clear, MAE -talking in her sleep HEENT:  MM pink/moist, short / thick neck  Cardiovascular:  s1s2 distant, irr irr  Lungs:  resp's even/non-labored, lungs bilaterally diminished lower posterior Abdomen:  Obese, soft, bsx4 active  Musculoskeletal:  No acute deformities  Skin:  Moist, warm, no overt edema   LABS:  CBC  Recent Labs Lab 01/24/15 0500 01/24/15 2043 01/25/15 0500  WBC 6.3 9.0 12.2*  HGB 11.8* 11.6* 11.7*  HCT 33.4* 32.4* 32.9*  PLT 84* 73*  74* 80*   Coag's  Recent Labs Lab 01/24/15 2043  APTT 35  INR 1.18    BMET  Recent Labs Lab 01/23/15 1230 01/24/15 0500 01/25/15 0500  NA 127* 125* 125*  K 3.9  3.8 3.8  CL 101 100 97  CO2 13* 18* 13*  BUN 38* 52* 66*  CREATININE 2.41* 3.25* 4.06*  GLUCOSE 116* 107* 92   Electrolytes  Recent Labs Lab 01/22/15 1750 01/23/15 1230 01/24/15 0500 01/25/15 0500  CALCIUM 8.6 7.3* 7.0* 7.2*  MG 1.8  --   --   --   PHOS 3.3  --   --   --    Sepsis Markers  Recent Labs Lab 01/23/15 1900 01/24/15 1045 01/24/15 2043  LATICACIDVEN 3.3* 1.4 1.3  PROCALCITON  --   --  48.23   ABG  Recent Labs Lab 01/24/15 1130 01/24/15 2300  PHART 7.386 7.300*  PCO2ART 19.9* 22.8*  PO2ART 90.8 137.0*   Liver Enzymes  Recent  Labs Lab 01/24/15 0500 01/25/15 0500  AST 54* 38*  ALT 39* 37*  ALKPHOS 38* 41  BILITOT 1.4* 1.2  ALBUMIN 2.2* 2.0*   Cardiac Enzymes  Recent Labs Lab 01/23/15 1050 01/23/15 1230  TROPONINI 0.03 <0.03   Glucose  Recent Labs Lab 01/24/15 2237  GLUCAP 89   Imaging Ct Abdomen Pelvis Wo Contrast  01/24/2015   CLINICAL DATA:  Abdominal pain, nausea, vomiting, diarrhea. Fever and chills. Sepsis.  EXAM: CT ABDOMEN AND PELVIS WITHOUT CONTRAST  TECHNIQUE: Multidetector CT imaging of the abdomen and pelvis was performed following the standard protocol without IV contrast.  COMPARISON:  No similar prior exam is available at this institution for comparison or on [facility].  FINDINGS: Lower chest: Small bilateral pleural effusions are noted. Interlobular septal thickening and bronchial wall thickening are suggestive of interstitial pulmonary edema.  Hepatobiliary: Hepatic hypodensity suggests steatosis. No focal hepatic abnormality or intrahepatic ductal dilatation. Gallbladder distention with probable dependent stone image 34.  Pancreas: Normal  Spleen: Normal  Adrenals/Urinary Tract: Adrenal glands appear normal. Moderate nonspecific bilateral perinephric stranding is identified. No hydroureteronephrosis. A Foley catheter is in place and the bladder is decompressed. No radiopaque renal or ureteral calculus. Renal parenchymal detail is obscured by lack of contrast.  Stomach/Bowel: Stomach appears normal. Normal appendix. No bowel wall thickening or focal segmental dilatation is identified.  Vascular/Lymphatic: No lymphadenopathy. Mild atheromatous aortic calcification identified.  Reproductive: Uterus and ovaries are unremarkable.  Other: Mild soft tissue anasarca is present which may suggest fluid overload. No free air or free intra-abdominal fluid.  Musculoskeletal: No acute osseous abnormality. L5-S1 disc degenerative change.  IMPRESSION: Findings at the lung bases suggest interstitial pulmonary  edema with mild soft tissue anasarca.  Nonspecific bilateral perinephric stranding without hydroureteronephrosis or radiopaque renal or ureteral calculus.  Gallbladder distention with possible internal gallstone identified. This again raises the question of possible cholecystitis in the setting of sepsis.   Electronically Signed   By: Conchita Paris M.D.   On: 01/24/2015 21:04   US Renal  01/24/2015   CLINICAL DATA:  Acute renal insufficiency.  EXAM: RENAL/URINARY TRACT ULTRASOUND COMPLETE  COMPARISON:  11/16/2009  FINDINGS: Right Kidney:  Length: 10.1 cm. Echogenicity within normal limits. No mass or hydronephrosis visualized.  Left Kidney:  Length: 10.4 cm. No hydronephrosis. Mild perinephric edema or trace fluid adjacent the lower pole left kidney.  Bladder:  Collapsed around a Foley catheter.  Exam degraded by patient body habitus. Incidental note is made of a 2.6 cm gallstone and a left-sided pleural effusion.  IMPRESSION: 1.  No hydronephrosis.  No explanation for renal insufficiency. 2. Decreased sensitivity and specificity exam due to technique related factors, as described above. 3. Cholelithiasis. 4. Small left pleural effusion.  Electronically Signed   By: Abigail Miyamoto M.D.   On: 01/24/2015 10:56   Dg Chest Port 1 View  01/24/2015   CLINICAL DATA:  Wheezing and shortness of breath.  EXAM: PORTABLE CHEST - 1 VIEW  COMPARISON:  01/24/2015  FINDINGS: Left-sided PICC line tip overlies the superior vena cava. Heart is mildly prominent. There Kerley B-lines consistent with pulmonary edema. No focal consolidation. No overt alveolar edema.  IMPRESSION: 1. Mild interstitial edema. 2. Left-sided PICC line tip to the superior vena cava.   Electronically Signed   By: Nolon Nations M.D.   On: 01/24/2015 22:14   Dg Chest Port 1 View  01/24/2015   CLINICAL DATA:  Hypoxia, pt was admitted with an acute URI and right armpit infection and was found to be in a-fib with RVR, hypovolemic, hyponatremic with an  acute renal failure.  EXAM: PORTABLE CHEST - 1 VIEW  COMPARISON:  Radiograph 01/23/2015  FINDINGS: Left-sided PICC line with tip in distal SVC. Stable cardiac silhouette. There is mild central venous congestion. No focal infiltrate. No pulmonary edema. Mild basilar atelectasis.  IMPRESSION: Mild basilar atelectasis and minimal central venous congestion. No change from prior.   Electronically Signed   By: Suzy Bouchard M.D.   On: 01/24/2015 09:44   ASSESSMENT / PLAN:  PULMONARY  A: Dependent Atelectasis  Tachypnea - in setting of sepsis / AKI Acute pulm edema High D-dimer, but doubt PE P:   Pulmonary hygiene Oxygen to support sats > 92% May need CPAP during sleep if desaturates Venous duplex neg prelim but not finalized yet, hold off VQ (ordered by primary team)  CARDIOVASCULAR PICC LUE 4/10 >>  A:  Atrial Fibrillation - hx of PAF in past, currently related to sepsis of unclear etiology  Septic shock P:  Hold Atenolol, Cardizem per Cardiology for rate control ASA Neo gtt, will attempt to titrate to off as able. If BP measurement remains inconsistent will consider an arterial line.  RENAL A:   Acute Kidney Injury - in setting of sepsis Lactic Acidosis - resolving.  AG 7 Hyponatremia  P:   Hold nephrotoxic agents. Start low dose bicarb drip @50  ml/hr. Renal consult called per primary SVC. Monitor I/O's. Renal dose abx/meds per pharmacy.  GASTROINTESTINAL A:   Obesity N/V/D  Suspect Viral Gastroenteritis  P:   PRN Zofran. RUQ U/S pending. NPO for now until RUQ U/S is done and surgery has weighed in.  HEMATOLOGIC A:   Thrombocytopenia  P:  Trend CBC DVT: heparin sq  INFECTIOUS A:   Septic shock - GPC's. Favor cellulitis over  gastroenteritis or acute chole Nausea / Vomiting / Diarrhea Chest Wall Induration / R Breast / Axilla Pain  P:   BCx2 4/11 >> 1/2 GPC's in chains >>  C-Diff 4/11 >> neg Flu 4/11 >> neg  UA 4/10 >> few bacteria, 7-10 WBC  UC 4/11  >>   Vanco, start date 4/10, day 3/x Zosyn, start date 4/10, day 3/x  Repeat lactic acid reassuring  ENDOCRINE A:   Mild Hyperglycemia P:   Monitor glucose on BMP, if consistently > 180 add SSI   NEUROLOGIC A:  Acute encephalopathy - sleep talking Depression  P:   RASS goal: n/a Continue zoloft, per primary SVC  FAMILY  - Updates: Patient updated at bedside.   Summary - septic shock - cellulitis , doubt acute chole but RUQ U/S pending, ID service called surgery so will follow up, diarrhea resolved, will start low dose bicarb drip  and attempt to get off neo today.  The patient is critically ill with multiple organ systems failure and requires high complexity decision making for assessment and support, frequent evaluation and titration of therapies, application of advanced monitoring technologies and extensive interpretation of multiple databases.   Critical Care Time devoted to patient care services described in this note is  35  Minutes. This time reflects time of care of this signee Dr Jennet Maduro. This critical care time does not reflect procedure time, or teaching time or supervisory time of PA/NP/Med student/Med Resident etc but could involve care discussion time.  Rush Farmer, M.D. [facility] Pulmonary/Critical Care Medicine. Pager: [phone]. After hours pager: [phone].  01/25/2015, 10:53 AM

## 2015-01-25 NOTE — Progress Notes (Addendum)
HPI: 69yo F w/ PMH Afib on ASA who was admitted with Afib with RVR and septic shock and renal insufficiency. She was started on a diltiazem drip for her Afib, which resulted in further decline of her blood pressures. The diltiazem was discontinued and she was started on a dopamine drip to improve her blood pressures. Her BP improved, but she subsequently became more tachycardic into the 120-140s. She also developed increased respiratory distress with wheezing and diaphoresis.   PE: T 97.5  HR 115  RR 25 BP 115/77 O2 96% on 4L [place] [facility]: Lying in bed, appears to be in moderate distress HEENT: Nasal canula in place Cardiac: Tachycardic Pulm: Moderate respiratory distress with accessory muscle use. Rhonchorous breath sounds diffusely with mild scattered wheezes. Mild crackles at the bases bilaterally. Abd: Soft, nontender, nondistended, BS present but hypoactive Ext: Warm and well perfused, trace BLE pitting edema, no asymmetry or tenderness to palpation.   Neuro: Drowsy but mostly arousable and oriented X3, with intermittent episodes of confusion and ?falling asleep. Moving all 4 extremities.   A&P: Hypotension: Likely in the setting of sepsis and due to diltiazem. Dopamine started but causing increased tachycardia. Will transfer to ICU to initiate vasopressors with goal MAP >65 Start Neo-synephrine Wean off dopamine as tolerated.  KVO IVF Continue abx  Acute encephalopathy: Worsening of mental status per nursing that seems to correlate with acute respiratory distress per nursing. Pt actually sees to be falling asleep and talking in her sleep. She is arousable and appropriate. Pt not hypercapnic. Possibly related to sepsis vs cardiac or pulmonary cause. Treating afib and hypotension and will continue to monitor mental status  Afib with RVR: Improved on diltiazem which was stopped due to hypotension. HR increased on dopamine started for her hypotension.  Will transition off dopamine and monitor  HR  If Afib with RVR returns, will restart diltiazem drip  Acute respiratory distress: Increased tachypnea and wheezing. Mild crackles at bases with diffuse rhonchi on exam. No h/o asthma or COPD/tobacco abuse. CXR with mild interstitial edema. ABG without hypercapnia. Pt using excessory muscles, and may tire out soon.  Will begin Duonebs q4h PRN, need to monitor for worsening tachycardia Considering giving Lasix 80mg  IV in the setting of worsening renal function with poor uop to remove excess fluid.  Elevated d-dimer in setting of worsening AKI. BLE dopplers and V/Q scan ordered by primary team to r/o DVT/PE. Considering therapeutic anticoagulation.   Case discussed with Dr. Jimmy Footman with [facility].

## 2015-01-25 NOTE — Consult Note (Signed)
Reason for Consult: acute cholecystitis  Referring Physician: Dr. Rhina Brackett Dam   HPI: Amanda Wilkins is a 69 year old female with a history of paroxysmal Afib, HTN, obesity admitted on 01/22/15 with malaise and diarrhea.  She was found to have Afib with RVR, AKI  and sepsis.  She denies fevers prior to admission.  Denies abdominal pain, but endorses to right axillary/breast region pain for several weeks now.  She was started on a cardizem drip.  PCCM was consulted on 4/11.  The patient has been on Zosyn, Vanc and tamiflu.  Blood cultures showed GPC.  CT of chest did not show evidence of a chest wall abscess and no clear etiology of her ongoing pain.  A CT of abdomen and pelvis showed gallbladder distention with possible gallstone questioning cholecystitis.  We have therefore been asked to evaluate.  White count is up today at 12.2.  1/2 blood cultures positive for Group A Strep.  Influenza PCR was negative.  Antibiotics have been narrowed to Zosyn and clindamycin.  The patient is NPO.  She is requiring Neo-synephrine for intermittent hypotension.  She complains of right sided abdominal pain, denies nausea or vomiting, diarrhea has resolved.  She has been afebrile since 4/10.  The patient is alert and awake.  Complains of dyspnea.  Before hospitalization, DOE with 20 steps, able to mobilize on her own.  She was not on anticoagulation other than ASA 360m.  Previous abdominal surgeries include; c section.   Past Medical History  Diagnosis Date  . Allergy   . Atrial fibrillation   . Migraines     on Zoloft for migraines  . Hypertension   . Chronic interstitial nephritis   . Depression   . Palpitations   . Obesity   . Arthritis     ON BOTH KNEES    Past Surgical History  Procedure Laterality Date  . Cesarean section    . Dental surgery      multiple implants  . Repair of torn meniscus on left 08/2014 Left 08/2015    [facility]   Family History  Problem Relation Age of  Onset  . Cancer Brother     PANCREATIC    Social History:  reports that she has never smoked. She has never used smokeless tobacco. She reports that she drinks about 8.4 oz of alcohol per week. She reports that she does not use illicit drugs.  Allergies:  Allergies  Allergen Reactions  . Epinephrine Other (See Comments)    Heart rate soars  . Epinephrine Base Other (See Comments)    Seriously increases heart rate  . Aspirin Other (See Comments)    Can take the coated 325 mg. Plain asa 325 mg speeds up the heart.  . Penicillins Other (See Comments)    Pt previously has been told not to take because of family history of reactions (water blisters). She took amoxicillin in 2012-2013 with no reaction    Medications:  Scheduled Meds: . antiseptic oral rinse  7 mL Mouth Rinse q12n4p  . aspirin EC  325 mg Oral Daily  . chlorhexidine  15 mL Mouth Rinse BID  . clindamycin (CLEOCIN) IV  900 mg Intravenous 3 times per day  . estrogens (conjugated)  0.3 mg Oral Q48H  . heparin  5,000 Units Subcutaneous 3 times per day  . ipratropium-albuterol  3 mL Nebulization Q4H  . LORazepam  1 mg Oral Once  . piperacillin-tazobactam (ZOSYN)  IV  2.25 g  Intravenous 4 times per day  . sertraline  50 mg Oral Daily  . sodium chloride  3 mL Intravenous Q12H   Continuous Infusions: . sodium chloride 10 mL/hr at 01/25/15 0800  . diltiazem (CARDIZEM) infusion Stopped (01/24/15 2226)  . phenylephrine (NEO-SYNEPHRINE) Adult infusion 40 mcg/min (01/25/15 0800)   PRN Meds:.sodium chloride, acetaminophen **OR** acetaminophen, guaifenesin, levalbuterol, loperamide, ondansetron **OR** ondansetron (ZOFRAN) IV, sodium chloride   Results for orders placed or performed during the hospital encounter of 01/22/15 (from the past 48 hour(s))  Troponin I     Status: None   Collection Time: 01/23/15 10:50 AM  Result Value Ref Range   Troponin I 0.03 <0.031 ng/mL    Comment:        NO INDICATION OF MYOCARDIAL INJURY.    Culture, blood (routine x 2)     Status: None (Preliminary result)   Collection Time: 01/23/15 12:23 PM  Result Value Ref Range   Specimen Description BLOOD RIGHT HAND    Special Requests BOTTLES DRAWN AEROBIC ONLY 4CC    Culture      GROUP A STREP (S.PYOGENES) ISOLATED Note: CRITICAL RESULT CALLED TO, READ BACK BY AND VERIFIED WITH: CHRISTINE HINSHAW @ [id number] ON [id number] BY [place] Note: Gram Stain Report Called to,Read Back By and Verified With: Sherri Rad RN on 01/24/15 at 06:55 by Rise Mu Performed at [facility]    Report Status PENDING   Troponin I     Status: None   Collection Time: 01/23/15 12:30 PM  Result Value Ref Range   Troponin I <0.03 <0.031 ng/mL    Comment:        NO INDICATION OF MYOCARDIAL INJURY.   TSH     Status: None   Collection Time: 01/23/15 12:30 PM  Result Value Ref Range   TSH 2.917 0.350 - 4.500 uIU/mL  Lactic acid, plasma     Status: Abnormal   Collection Time: 01/23/15 12:30 PM  Result Value Ref Range   Lactic Acid, Venous 3.6 (HH) 0.5 - 2.0 mmol/L    Comment: REPEATED TO VERIFY CRITICAL RESULT CALLED TO, READ BACK BY AND VERIFIED WITH: B DAVIS,RN 04.10.16 1355 M SHIPMAN   Culture, blood (routine x 2)     Status: None (Preliminary result)   Collection Time: 01/23/15 12:30 PM  Result Value Ref Range   Specimen Description BLOOD LEFT HAND    Special Requests BOTTLES DRAWN AEROBIC ONLY 5CC    Culture             BLOOD CULTURE RECEIVED NO GROWTH TO DATE CULTURE WILL BE HELD FOR 5 DAYS BEFORE ISSUING A FINAL NEGATIVE REPORT Performed at [facility]    Report Status PENDING   Basic metabolic panel     Status: Abnormal   Collection Time: 01/23/15 12:30 PM  Result Value Ref Range   Sodium 127 (L) 135 - 145 mmol/L   Potassium 3.9 3.5 - 5.1 mmol/L   Chloride 101 96 - 112 mmol/L   CO2 13 (L) 19 - 32 mmol/L   Glucose, Bld 116 (H) 70 - 99 mg/dL   BUN 38 (H) 6 - 23 mg/dL   Creatinine, Ser 2.41 (H) 0.50 - 1.10 mg/dL   Calcium 7.3  (L) 8.4 - 10.5 mg/dL   GFR calc non Af Amer 20 (L) >90 mL/min   GFR calc Af Amer 23 (L) >90 mL/min    Comment: (NOTE) The eGFR has been calculated using the CKD EPI equation. This calculation has not  been validated in all clinical situations. eGFR's persistently <90 mL/min signify possible Chronic Kidney Disease.    Anion gap 13 5 - 15  Urinalysis, Routine w reflex microscopic     Status: Abnormal   Collection Time: 01/23/15  6:14 PM  Result Value Ref Range   Color, Urine AMBER (A) YELLOW    Comment: BIOCHEMICALS MAY BE AFFECTED BY COLOR   APPearance TURBID (A) CLEAR   Specific Gravity, Urine 1.024 1.005 - 1.030   pH 5.0 5.0 - 8.0   Glucose, UA NEGATIVE NEGATIVE mg/dL   Hgb urine dipstick MODERATE (A) NEGATIVE   Bilirubin Urine SMALL (A) NEGATIVE   Ketones, ur NEGATIVE NEGATIVE mg/dL   Protein, ur 100 (A) NEGATIVE mg/dL   Urobilinogen, UA 1.0 0.0 - 1.0 mg/dL   Nitrite NEGATIVE NEGATIVE   Leukocytes, UA SMALL (A) NEGATIVE  Urine microscopic-add on     Status: Abnormal   Collection Time: 01/23/15  6:14 PM  Result Value Ref Range   Squamous Epithelial / LPF RARE RARE   WBC, UA 7-10 <3 WBC/hpf   RBC / HPF 0-2 <3 RBC/hpf   Bacteria, UA FEW (A) RARE   Urine-Other AMORPHOUS URATES/PHOSPHATES   Lactic acid, plasma     Status: Abnormal   Collection Time: 01/23/15  7:00 PM  Result Value Ref Range   Lactic Acid, Venous 3.3 (HH) 0.5 - 2.0 mmol/L    Comment: REPEATED TO VERIFY CRITICAL RESULT CALLED TO, READ BACK BY AND VERIFIED WITH: GARDENER,D 1024 01/23/15 TEETERN GARDENER,D RN   Comprehensive metabolic panel     Status: Abnormal   Collection Time: 01/24/15  5:00 AM  Result Value Ref Range   Sodium 125 (L) 135 - 145 mmol/L   Potassium 3.8 3.5 - 5.1 mmol/L   Chloride 100 96 - 112 mmol/L   CO2 18 (L) 19 - 32 mmol/L   Glucose, Bld 107 (H) 70 - 99 mg/dL   BUN 52 (H) 6 - 23 mg/dL   Creatinine, Ser 3.25 (H) 0.50 - 1.10 mg/dL   Calcium 7.0 (L) 8.4 - 10.5 mg/dL   Total Protein 5.0  (L) 6.0 - 8.3 g/dL   Albumin 2.2 (L) 3.5 - 5.2 g/dL   AST 54 (H) 0 - 37 U/L   ALT 39 (H) 0 - 35 U/L   Alkaline Phosphatase 38 (L) 39 - 117 U/L   Total Bilirubin 1.4 (H) 0.3 - 1.2 mg/dL   GFR calc non Af Amer 14 (L) >90 mL/min   GFR calc Af Amer 16 (L) >90 mL/min    Comment: (NOTE) The eGFR has been calculated using the CKD EPI equation. This calculation has not been validated in all clinical situations. eGFR's persistently <90 mL/min signify possible Chronic Kidney Disease.    Anion gap 7 5 - 15  CBC     Status: Abnormal   Collection Time: 01/24/15  5:00 AM  Result Value Ref Range   WBC 6.3 4.0 - 10.5 K/uL   RBC 3.50 (L) 3.87 - 5.11 MIL/uL   Hemoglobin 11.8 (L) 12.0 - 15.0 g/dL    Comment: DELTA CHECK NOTED REPEATED TO VERIFY    HCT 33.4 (L) 36.0 - 46.0 %   MCV 95.4 78.0 - 100.0 fL   MCH 33.7 26.0 - 34.0 pg   MCHC 35.3 30.0 - 36.0 g/dL   RDW 13.6 11.5 - 15.5 %   Platelets 84 (L) 150 - 400 K/uL    Comment: REPEATED TO VERIFY PLATELET COUNT CONFIRMED BY SMEAR  Lactic acid, plasma     Status: None   Collection Time: 01/24/15 10:45 AM  Result Value Ref Range   Lactic Acid, Venous 1.4 0.5 - 2.0 mmol/L  Blood gas, arterial     Status: Abnormal   Collection Time: 01/24/15 11:30 AM  Result Value Ref Range   O2 Content 3.5 L/min   pH, Arterial 7.386 7.350 - 7.450   pCO2 arterial 19.9 (LL) 35.0 - 45.0 mmHg    Comment: CRITICAL RESULT CALLED TO, READ BACK BY AND VERIFIED WITH: DEBBIE BRITT RN AT 11:37 CALLED, BY NIKKI NELSON RRT 01/24/2015    pO2, Arterial 90.8 80.0 - 100.0 mmHg   Bicarbonate 11.6 (L) 20.0 - 24.0 mEq/L   TCO2 12.3 0 - 100 mmol/L   Acid-base deficit 12.5 (H) 0.0 - 2.0 mmol/L   O2 Saturation 96.2 %   Patient temperature 98.6    Collection site RIGHT RADIAL    Drawn by [id number]    Sample type ARTERIAL DRAW    Allens test (pass/fail) PASS PASS  Clostridium Difficile by PCR     Status: None   Collection Time: 01/24/15 12:59 PM  Result Value Ref Range   C  difficile by pcr NEGATIVE NEGATIVE  MRSA PCR Screening     Status: None   Collection Time: 01/24/15  4:30 PM  Result Value Ref Range   MRSA by PCR NEGATIVE NEGATIVE    Comment:        The GeneXpert MRSA Assay (FDA approved for NASAL specimens only), is one component of a comprehensive MRSA colonization surveillance program. It is not intended to diagnose MRSA infection nor to guide or monitor treatment for MRSA infections.   Lactic acid, plasma     Status: None   Collection Time: 01/24/15  8:43 PM  Result Value Ref Range   Lactic Acid, Venous 1.3 0.5 - 2.0 mmol/L  Procalcitonin - Baseline     Status: None   Collection Time: 01/24/15  8:43 PM  Result Value Ref Range   Procalcitonin 48.23 ng/mL    Comment:        Interpretation: PCT >= 10 ng/mL: Important systemic inflammatory response, almost exclusively due to severe bacterial sepsis or septic shock. (NOTE)         ICU PCT Algorithm               Non ICU PCT Algorithm    ----------------------------     ------------------------------         PCT < 0.25 ng/mL                 PCT < 0.1 ng/mL     Stopping of antibiotics            Stopping of antibiotics       strongly encouraged.               strongly encouraged.    ----------------------------     ------------------------------       PCT level decrease by               PCT < 0.25 ng/mL       >= 80% from peak PCT       OR PCT 0.25 - 0.5 ng/mL          Stopping of antibiotics  encouraged.     Stopping of antibiotics           encouraged.    ----------------------------     ------------------------------       PCT level decrease by              PCT >= 0.25 ng/mL       < 80% from peak PCT        AND PCT >= 0.5 ng/mL             Continuing antibiotics                                              encouraged.       Continuing antibiotics            encouraged.    ----------------------------     ------------------------------      PCT level increase compared          PCT > 0.5 ng/mL         with peak PCT AND          PCT >= 0.5 ng/mL             Escalation of antibiotics                                          strongly encouraged.      Escalation of antibiotics        strongly encouraged.   DIC (disseminated intravasc coag) panel     Status: Abnormal   Collection Time: 01/24/15  8:43 PM  Result Value Ref Range   Prothrombin Time 15.1 11.6 - 15.2 seconds   INR 1.18 0.00 - 1.49   aPTT 35 24 - 37 seconds   Fibrinogen 624 (H) 204 - 475 mg/dL   D-Dimer, Quant 8.22 (H) 0.00 - 0.48 ug/mL-FEU    Comment:        AT THE INHOUSE ESTABLISHED CUTOFF VALUE OF 0.48 ug/mL FEU, THIS ASSAY HAS BEEN DOCUMENTED IN THE LITERATURE TO HAVE A SENSITIVITY AND NEGATIVE PREDICTIVE VALUE OF AT LEAST 98 TO 99%.  THE TEST RESULT SHOULD BE CORRELATED WITH AN ASSESSMENT OF THE CLINICAL PROBABILITY OF DVT / VTE.    Platelets 74 (L) 150 - 400 K/uL    Comment: SPECIMEN CHECKED FOR CLOTS REPEATED TO VERIFY PLATELET COUNT CONFIRMED BY SMEAR    Smear Review NO SCHISTOCYTES SEEN   CBC with Differential/Platelet     Status: Abnormal   Collection Time: 01/24/15  8:43 PM  Result Value Ref Range   WBC 9.0 4.0 - 10.5 K/uL   RBC 3.43 (L) 3.87 - 5.11 MIL/uL   Hemoglobin 11.6 (L) 12.0 - 15.0 g/dL   HCT 32.4 (L) 36.0 - 46.0 %   MCV 94.5 78.0 - 100.0 fL   MCH 33.8 26.0 - 34.0 pg   MCHC 35.8 30.0 - 36.0 g/dL   RDW 13.6 11.5 - 15.5 %   Platelets 73 (L) 150 - 400 K/uL    Comment: REPEATED TO VERIFY CONSISTENT WITH PREVIOUS RESULT    Neutrophils Relative % 90 (H) 43 - 77 %   Lymphocytes Relative 5 (L) 12 - 46 %   Monocytes Relative 4 3 - 12 %   Eosinophils Relative 1  0 - 5 %   Basophils Relative 0 0 - 1 %   Neutro Abs 8.0 (H) 1.7 - 7.7 K/uL   Lymphs Abs 0.5 (L) 0.7 - 4.0 K/uL   Monocytes Absolute 0.4 0.1 - 1.0 K/uL   Eosinophils Absolute 0.1 0.0 - 0.7 K/uL   Basophils Absolute 0.0 0.0 - 0.1 K/uL   RBC Morphology BURR CELLS    WBC  Morphology VACUOLATED NEUTROPHILS   Lactate dehydrogenase     Status: Abnormal   Collection Time: 01/24/15  8:43 PM  Result Value Ref Range   LDH 254 (H) 94 - 250 U/L  Glucose, capillary     Status: None   Collection Time: 01/24/15 10:37 PM  Result Value Ref Range   Glucose-Capillary 89 70 - 99 mg/dL  Blood gas, arterial     Status: Abnormal   Collection Time: 01/24/15 11:00 PM  Result Value Ref Range   FIO2 1.00 %   Delivery systems NON-REBREATHER OXYGEN MASK    pH, Arterial 7.300 (L) 7.350 - 7.450   pCO2 arterial 22.8 (L) 35.0 - 45.0 mmHg   pO2, Arterial 137.0 (H) 80.0 - 100.0 mmHg   Bicarbonate 10.9 (L) 20.0 - 24.0 mEq/L   TCO2 11.6 0 - 100 mmol/L   Acid-base deficit 14.3 (H) 0.0 - 2.0 mmol/L   O2 Saturation 97.9 %   Patient temperature 98.6    Collection site RIGHT RADIAL    Drawn by [id number]    Sample type ARTERIAL DRAW    Allens test (pass/fail) PASS PASS  CBC     Status: Abnormal   Collection Time: 01/25/15  5:00 AM  Result Value Ref Range   WBC 12.2 (H) 4.0 - 10.5 K/uL   RBC 3.50 (L) 3.87 - 5.11 MIL/uL   Hemoglobin 11.7 (L) 12.0 - 15.0 g/dL    Comment: CONSISTENT WITH PREVIOUS RESULT   HCT 32.9 (L) 36.0 - 46.0 %   MCV 94.0 78.0 - 100.0 fL   MCH 33.4 26.0 - 34.0 pg   MCHC 35.6 30.0 - 36.0 g/dL   RDW 13.7 11.5 - 15.5 %   Platelets 80 (L) 150 - 400 K/uL    Comment: SPECIMEN CHECKED FOR CLOTS CONSISTENT WITH PREVIOUS RESULT   Comprehensive metabolic panel     Status: Abnormal   Collection Time: 01/25/15  5:00 AM  Result Value Ref Range   Sodium 125 (L) 135 - 145 mmol/L   Potassium 3.8 3.5 - 5.1 mmol/L   Chloride 97 96 - 112 mmol/L   CO2 13 (L) 19 - 32 mmol/L   Glucose, Bld 92 70 - 99 mg/dL   BUN 66 (H) 6 - 23 mg/dL   Creatinine, Ser 4.06 (H) 0.50 - 1.10 mg/dL   Calcium 7.2 (L) 8.4 - 10.5 mg/dL   Total Protein 5.0 (L) 6.0 - 8.3 g/dL   Albumin 2.0 (L) 3.5 - 5.2 g/dL   AST 38 (H) 0 - 37 U/L   ALT 37 (H) 0 - 35 U/L   Alkaline Phosphatase 41 39 - 117 U/L   Total  Bilirubin 1.2 0.3 - 1.2 mg/dL   GFR calc non Af Amer 10 (L) >90 mL/min   GFR calc Af Amer 12 (L) >90 mL/min    Comment: (NOTE) The eGFR has been calculated using the CKD EPI equation. This calculation has not been validated in all clinical situations. eGFR's persistently <90 mL/min signify possible Chronic Kidney Disease.    Anion gap 15 5 - 15  Ct Abdomen Pelvis Wo Contrast  01/24/2015   CLINICAL DATA:  Abdominal pain, nausea, vomiting, diarrhea. Fever and chills. Sepsis.  EXAM: CT ABDOMEN AND PELVIS WITHOUT CONTRAST  TECHNIQUE: Multidetector CT imaging of the abdomen and pelvis was performed following the standard protocol without IV contrast.  COMPARISON:  No similar prior exam is available at this institution for comparison or on [facility].  FINDINGS: Lower chest: Small bilateral pleural effusions are noted. Interlobular septal thickening and bronchial wall thickening are suggestive of interstitial pulmonary edema.  Hepatobiliary: Hepatic hypodensity suggests steatosis. No focal hepatic abnormality or intrahepatic ductal dilatation. Gallbladder distention with probable dependent stone image 34.  Pancreas: Normal  Spleen: Normal  Adrenals/Urinary Tract: Adrenal glands appear normal. Moderate nonspecific bilateral perinephric stranding is identified. No hydroureteronephrosis. A Foley catheter is in place and the bladder is decompressed. No radiopaque renal or ureteral calculus. Renal parenchymal detail is obscured by lack of contrast.  Stomach/Bowel: Stomach appears normal. Normal appendix. No bowel wall thickening or focal segmental dilatation is identified.  Vascular/Lymphatic: No lymphadenopathy. Mild atheromatous aortic calcification identified.  Reproductive: Uterus and ovaries are unremarkable.  Other: Mild soft tissue anasarca is present which may suggest fluid overload. No free air or free intra-abdominal fluid.  Musculoskeletal: No acute osseous abnormality. L5-S1 disc degenerative  change.  IMPRESSION: Findings at the lung bases suggest interstitial pulmonary edema with mild soft tissue anasarca.  Nonspecific bilateral perinephric stranding without hydroureteronephrosis or radiopaque renal or ureteral calculus.  Gallbladder distention with possible internal gallstone identified. This again raises the question of possible cholecystitis in the setting of sepsis.   Electronically Signed   By: Conchita Paris M.D.   On: 01/24/2015 21:04   US Renal  01/24/2015   CLINICAL DATA:  Acute renal insufficiency.  EXAM: RENAL/URINARY TRACT ULTRASOUND COMPLETE  COMPARISON:  11/16/2009  FINDINGS: Right Kidney:  Length: 10.1 cm. Echogenicity within normal limits. No mass or hydronephrosis visualized.  Left Kidney:  Length: 10.4 cm. No hydronephrosis. Mild perinephric edema or trace fluid adjacent the lower pole left kidney.  Bladder:  Collapsed around a Foley catheter.  Exam degraded by patient body habitus. Incidental note is made of a 2.6 cm gallstone and a left-sided pleural effusion.  IMPRESSION: 1.  No hydronephrosis.  No explanation for renal insufficiency. 2. Decreased sensitivity and specificity exam due to technique related factors, as described above. 3. Cholelithiasis. 4. Small left pleural effusion.   Electronically Signed   By: Abigail Miyamoto M.D.   On: 01/24/2015 10:56   Dg Chest Port 1 View  01/24/2015   CLINICAL DATA:  Wheezing and shortness of breath.  EXAM: PORTABLE CHEST - 1 VIEW  COMPARISON:  01/24/2015  FINDINGS: Left-sided PICC line tip overlies the superior vena cava. Heart is mildly prominent. There Kerley B-lines consistent with pulmonary edema. No focal consolidation. No overt alveolar edema.  IMPRESSION: 1. Mild interstitial edema. 2. Left-sided PICC line tip to the superior vena cava.   Electronically Signed   By: Nolon Nations M.D.   On: 01/24/2015 22:14   Dg Chest Port 1 View  01/24/2015   CLINICAL DATA:  Hypoxia, pt was admitted with an acute URI and right armpit  infection and was found to be in a-fib with RVR, hypovolemic, hyponatremic with an acute renal failure.  EXAM: PORTABLE CHEST - 1 VIEW  COMPARISON:  Radiograph 01/23/2015  FINDINGS: Left-sided PICC line with tip in distal SVC. Stable cardiac silhouette. There is mild central venous congestion. No focal infiltrate. No pulmonary edema.  Mild basilar atelectasis.  IMPRESSION: Mild basilar atelectasis and minimal central venous congestion. No change from prior.   Electronically Signed   By: Suzy Bouchard M.D.   On: 01/24/2015 09:44   Dg Chest Port 1 View  01/23/2015   CLINICAL DATA:  Shortness of breath.  EXAM: PORTABLE CHEST - 1 VIEW  COMPARISON:  Same day.  FINDINGS: The heart size and mediastinal contours are within normal limits. Both lungs are clear. No pneumothorax or pleural effusion is noted. Left-sided PICC line is noted with tip in expected position of cavoatrial junction. The visualized skeletal structures are unremarkable.  IMPRESSION: No acute cardiopulmonary abnormality seen.   Electronically Signed   By: Marijo Conception, M.D.   On: 01/23/2015 18:52   Dg Chest Port 1 View  01/23/2015   CLINICAL DATA:  PICC line placement  EXAM: PORTABLE CHEST - 1 VIEW  COMPARISON:  CT scan of the chest same day  FINDINGS: Cardiomediastinal silhouette is unremarkable. No acute infiltrate or pulmonary edema. There is a left arm PICC line with tip in right atrium. For distal SVC position the PICC line should be retracted about 1 cm. No pneumothorax.  IMPRESSION: Left arm PICC line with tip in right atrium. For distal SVC position PICC line should be retracted about 1 cm. No pneumothorax.   Electronically Signed   By: Lahoma Crocker M.D.   On: 01/23/2015 17:45    Review of Systems  All other systems reviewed and are negative.  Blood pressure 109/77, pulse 128, temperature 97.6 F (36.4 C), temperature source Oral, resp. rate 27, height 5' 6"  (1.676 m), weight 112.3 kg (247 lb 9.2 oz), SpO2 100 %. Physical Exam   Constitutional: She is oriented to person, place, and time. She appears well-developed and well-nourished. She appears distressed.  Cardiovascular: Exam reveals no gallop and no friction rub.   No murmur heard. S1S2 irregularly irregular  Respiratory: Effort normal and breath sounds normal.  GI: Soft. Bowel sounds are normal. She exhibits no distension and no mass. There is no rebound and no guarding.  TTP epigastric region, ruq, right lateral abdomen  Musculoskeletal: She exhibits edema.  Neurological: She is alert and oriented to person, place, and time.  Skin: Skin is warm and dry. No rash noted. She is not diaphoretic. No erythema. No pallor.  Psychiatric: She has a normal mood and affect. Her behavior is normal. Judgment and thought content normal.    Assessment/Plan: Abdominal pain Group A strep bacteremia Septic shock Afib with RVR Right sided chest wall/axillary region pain Acute renal failure SOB/tachypnea  Possible cholecystitis   Will await abdominal US.  Keep NPO.  Agree with antibiotics.  If [place] is positive for cholecystitis, will ask cardiology to weigh in on surgery, cholecystectomy versus cholecystostomy tube.  I do not appreciate a right breast/axillary abscess on her, will await [place].  Surgery will follow closely.  Thank you for the consult.     Talissa Apple ANP-BC 01/25/2015, 10:36 AM

## 2015-01-25 NOTE — Progress Notes (Signed)
Noted to have Group A Strep on culture.  Called to RN.  Went to call Dr. Tommy Medal when noted in orders to change antibiotic to Clindamycin.  Notes by Dr. Tommy Medal mentions Group A Streptococci.  Not called since already aware.

## 2015-01-25 NOTE — Progress Notes (Signed)
HPI: Ms. Soman was admitted 4/9 after a 2 day hx of n/v/d and Fever at home of 101. Hx PAF and has been in Afib with RVR here thought to be secondary to SIRS/Sepsis/infection. Cardiology has seen pt. PCCM saw pt for Sepsis and signed off today because pt was doing better. Also, AKI and pulmonary edema which has limited use of IVFs. Can not use Lasix secondary to AKI. Nephrology has weighed in.  Has been on Cardizem drip and has been rate controlled today. BP dropped tonight, and RN stopped the drip around 2230. Pt had an episode of SOB with wheezing, diaphoresis and clammy, cool skin. NP to bedside. S: feels SOB. Sweaty. No chest pain. Family/RN say she has been hallucinating and confused at times since admission.  O: Chronically ill, acutely ill appearing obese WF in mild distress. She is alert and oriented but puffing with RR in the 30s. O2 sat is normal over 91%. Cardiac: irreg irreg rhythm with rate in the 80s with the cardizem drip off.  BP fluctuating-as low as 62 with MAP of 40 and 90s with a MAP 56-65. Lungs-minor crackles at bases, mild wheezing noted left upper prior to neb. Skin is cool and clammy with noted diaphoresis. BS 87. Has been afebrile since shift started. UO 150cc since shift change. MOEx4. No focal neurological deficits except for occasional talking inappropriately.  A/P: 1. SOB/wheezing-Xopenex neb relieved this and made pt "feel better". RR down after using NRB for awhile. Then, were able to wean her back to [place] at 4L with normal sats. Has pulmonary/interstitial edema on CXR but can not use Lasix due to BP and AKI. ABG showed compensated metabolic acidosis.  2. Hypotension-started bolus prior to Dopamine due to BP in the 60s. Pt got about 400cc of bolus and was stopped. Dopamine gtt started at 77mcg and titrated to 76mcg. MAP increased into the upper 50s to low 93Z with systolic in the 16R. Concerned about using Dopamine with Afib secondary to HR issues, but at the time, did not  have another choice. Discussed this with critical care. She may have to go to ICU and be placed on Levophed.  3. Pulmonary edema-cardio and nephrology following. Unfortunately, can not use Lasix at this time. Her breathing improved with Xopenex though. Will watch.  4. SIRS/Sepsis on admission with elevated Lactate. Now, lactate back to normal-1/2 blood cx +, preliminary so far. Urine cx pending. Still uncertain of source. Pt also has a reddened "sore" area under right axilla/breast. CT today showed no abscess. Due for [place] in am-ordered by ID. On broad spectrum abx now. Last CBC without leukocytosis. D is following.  5. Afib-rate still holding less than 100 even with Dopamine. Will follow. See what critical care recommends. May need to call cardiology tonight as well.  6. DDimer elevated-may be because of AKI. Can't do CTA secondary to AKI. Ordered a VQ for am as well as LE dopplers, defer to attending to continue or d/c. Low suspicion with lack of chest pain.  Son was in room and husband came later. Issues and treatment plan discussed in detail and questions asked and answered.  Clance Boll, NP [facility]

## 2015-01-25 NOTE — Progress Notes (Signed)
Amanda Wilkins, NPat bedside. New orders given. Pt bolused with 500cc NS. Breathing treatment administered. Blood gas obtained. Dopamine drip started. Pt placed on NRB to decrease WoB. Family at bedside and all questions answered by NP and RN.

## 2015-01-25 NOTE — Progress Notes (Signed)
Patient Name: Amanda Wilkins Date of Encounter: 01/25/2015     Principal Problem:   Atrial fibrillation with RVR Active Problems:   Palpitations   Upper respiratory disease   Dehydration   Breast pain, right   A-fib   Fever   Nausea & vomiting   Sepsis   Acute renal insufficiency   Abdominal pain   Axillary pain   Dyspnea   Hypoxia   Thrombocytopenia   Transaminitis   Hyperbilirubinemia   FUO (fever of unknown origin)   Septic shock   AKI (acute kidney injury)   Cholecystitis   DIC (disseminated intravascular coagulation)   Group A streptococcal infection   Cellulitis    SUBJECTIVE  Patient is resting more comfortably this evening.  She is alert.  She denies any chest pain or shortness of breath.  She is on nasal oxygen.  She is off diltiazem.  Blood pressure is stable area and she remains in atrial fibrillation with heart rate between 100 120 which she appears to be tolerating.  CURRENT MEDS . antiseptic oral rinse  7 mL Mouth Rinse q12n4p  . aspirin EC  325 mg Oral Daily  . chlorhexidine  15 mL Mouth Rinse BID  . clindamycin (CLEOCIN) IV  900 mg Intravenous 3 times per day  . estrogens (conjugated)  0.3 mg Oral Q48H  . heparin  5,000 Units Subcutaneous 3 times per day  . ipratropium-albuterol  3 mL Nebulization Q4H  . LORazepam  1 mg Oral Once  . piperacillin-tazobactam (ZOSYN)  IV  2.25 g Intravenous 4 times per day  . sertraline  50 mg Oral Daily  . sodium chloride  3 mL Intravenous Q12H    OBJECTIVE  Filed Vitals:   01/25/15 1601 01/25/15 1630 01/25/15 1700 01/25/15 1730  BP:  117/49 131/63 114/49  Pulse:  107 103 93  Temp: 98.2 F (36.8 C)     TempSrc: Oral     Resp:  31 26 28   Height:      Weight:      SpO2:  99% 100% 100%    Intake/Output Summary (Last 24 hours) at 01/25/15 1854 Last data filed at 01/25/15 1700  Gross per 24 hour  Intake 1452.88 ml  Output    650 ml  Net 802.88 ml   Filed Weights   01/23/15 0022 01/23/15 0400  01/25/15 0500  Weight: 232 lb 12.8 oz (105.597 kg) 232 lb 12.8 oz (105.597 kg) 247 lb 9.2 oz (112.3 kg)    PHYSICAL EXAM  General: Pleasant, NAD. Neuro: Alert and oriented X 3. Moves all extremities spontaneously. Psych: Normal affect. HEENT:  Normal  Neck: Supple without bruits or JVD. Lungs:  Resp regular and unlabored, CTA. Heart: Irregularly irregular no s3, s4, or murmurs. Abdomen: Soft, non-tender, non-distended, BS + x 4.  There is a bright erythematous rash along the right lower lateral abdomen and right flank Extremities: No clubbing, cyanosis or edema.   Accessory Clinical Findings  CBC  Recent Labs  01/24/15 2043 01/25/15 0500  WBC 9.0 12.2*  NEUTROABS 8.0*  --   HGB 11.6* 11.7*  HCT 32.4* 32.9*  MCV 94.5 94.0  PLT 73*  74* 80*   Basic Metabolic Panel  Recent Labs  01/24/15 0500 01/25/15 0500  NA 125* 125*  K 3.8 3.8  CL 100 97  CO2 18* 13*  GLUCOSE 107* 92  BUN 52* 66*  CREATININE 3.25* 4.06*  CALCIUM 7.0* 7.2*   Liver Function Tests  Recent Labs  01/24/15 0500 01/25/15 0500  AST 54* 38*  ALT 39* 37*  ALKPHOS 38* 41  BILITOT 1.4* 1.2  PROT 5.0* 5.0*  ALBUMIN 2.2* 2.0*   No results for input(s): LIPASE, AMYLASE in the last 72 hours. Cardiac Enzymes  Recent Labs  01/23/15 1050 01/23/15 1230  TROPONINI 0.03 <0.03   BNP Invalid input(s): POCBNP D-Dimer  Recent Labs  01/24/15 2043  DDIMER 8.22*   Hemoglobin A1C No results for input(s): HGBA1C in the last 72 hours. Fasting Lipid Panel No results for input(s): CHOL, HDL, LDLCALC, TRIG, CHOLHDL, LDLDIRECT in the last 72 hours. Thyroid Function Tests  Recent Labs  01/23/15 1230  TSH 2.917    TELE  Atrial fibrillation with rapid ventricular response  ECG    Radiology/Studies  Ct Abdomen Pelvis Wo Contrast  01/24/2015   CLINICAL DATA:  Abdominal pain, nausea, vomiting, diarrhea. Fever and chills. Sepsis.  EXAM: CT ABDOMEN AND PELVIS WITHOUT CONTRAST  TECHNIQUE:  Multidetector CT imaging of the abdomen and pelvis was performed following the standard protocol without IV contrast.  COMPARISON:  No similar prior exam is available at this institution for comparison or on [facility].  FINDINGS: Lower chest: Small bilateral pleural effusions are noted. Interlobular septal thickening and bronchial wall thickening are suggestive of interstitial pulmonary edema.  Hepatobiliary: Hepatic hypodensity suggests steatosis. No focal hepatic abnormality or intrahepatic ductal dilatation. Gallbladder distention with probable dependent stone image 34.  Pancreas: Normal  Spleen: Normal  Adrenals/Urinary Tract: Adrenal glands appear normal. Moderate nonspecific bilateral perinephric stranding is identified. No hydroureteronephrosis. A Foley catheter is in place and the bladder is decompressed. No radiopaque renal or ureteral calculus. Renal parenchymal detail is obscured by lack of contrast.  Stomach/Bowel: Stomach appears normal. Normal appendix. No bowel wall thickening or focal segmental dilatation is identified.  Vascular/Lymphatic: No lymphadenopathy. Mild atheromatous aortic calcification identified.  Reproductive: Uterus and ovaries are unremarkable.  Other: Mild soft tissue anasarca is present which may suggest fluid overload. No free air or free intra-abdominal fluid.  Musculoskeletal: No acute osseous abnormality. L5-S1 disc degenerative change.  IMPRESSION: Findings at the lung bases suggest interstitial pulmonary edema with mild soft tissue anasarca.  Nonspecific bilateral perinephric stranding without hydroureteronephrosis or radiopaque renal or ureteral calculus.  Gallbladder distention with possible internal gallstone identified. This again raises the question of possible cholecystitis in the setting of sepsis.   Electronically Signed   By: Conchita Paris M.D.   On: 01/24/2015 21:04   Dg Chest 2 View  01/22/2015   CLINICAL DATA:  Atrial fibrillation. Shortness of breath with  exertion. Right axillary pain. No injury.  EXAM: CHEST  2 VIEW  COMPARISON:  09/22/2014  FINDINGS: Normal heart size and pulmonary vascularity. Perihilar interstitial changes suggesting chronic bronchitis. Linear atelectasis or scarring in the lung bases. No focal consolidation. No blunting of costophrenic angles. No pneumothorax. Mediastinal contours appear intact. Calcified aorta. Mild degenerative changes in the spine.  IMPRESSION: Chronic bronchitic changes in the lungs with slight fibrosis or linear atelectasis in the lung bases. No focal consolidation.   Electronically Signed   By: Lucienne Capers M.D.   On: 01/22/2015 23:46   Ct Chest Wo Contrast  01/23/2015   CLINICAL DATA:  69 year old female with right-sided chest and axillary pain. Sepsis. Fever for 1-2 weeks.  EXAM: CT CHEST WITHOUT CONTRAST  TECHNIQUE: Multidetector CT imaging of the chest was performed following the standard protocol without IV contrast.  COMPARISON:  01/22/2015 chest radiograph. Chest CT report dated 09/22/2014. Images  are not available at this time due to PACS downtime.  FINDINGS: Mediastinum/Nodes: The heart and great vessels are unremarkable. There is no evidence of thoracic aortic aneurysm. No pleural or pericardial effusions are identified. No enlarged lymph nodes are noted.  Lungs/Pleura: Mild dependent and basilar atelectasis noted. There is no evidence of airspace disease, consolidation, suspicious nodule, mass or endobronchial/endotracheal lesion.  Upper abdomen: Hepatic steatosis identified.  Musculoskeletal: Right axillary inflammation is identified extending inferiorly along the right chest. There is no evidence of gas or abscess. No acute or suspicious bony abnormalities are identified.  IMPRESSION: Inflammation in the right axilla extending inferiorly along the right chest -suspicious for infection given clinical history. No evidence of focal abscess or gas.  Mild dependent/basilar atelectasis.   Electronically  Signed   By: Margarette Canada M.D.   On: 01/23/2015 11:12   [place] Chest  01/25/2015   CLINICAL DATA:  69 year old female with right axillary and chest wall pain. Fever and sepsis. Initial encounter.  EXAM: CHEST ULTRASOUND  COMPARISON:  Chest CT 01/23/2015.  FINDINGS: Ultrasound imaging in the area of clinical concern corresponding to the right axilla and chest wall.  Lymph nodes with normal fatty hila. Diffuse subcutaneous edema. Evidence of edematous chest wall muscle (image 19) with separation of muscle fibers, but no discrete or drainable fluid collection.  IMPRESSION: Subcutaneous and chest wall muscle edema with no discrete or drainable fluid collection. Regional lymph nodes remain normal.   Electronically Signed   By: Genevie Ann M.D.   On: 01/25/2015 12:11   US Renal  01/24/2015   CLINICAL DATA:  Acute renal insufficiency.  EXAM: RENAL/URINARY TRACT ULTRASOUND COMPLETE  COMPARISON:  11/16/2009  FINDINGS: Right Kidney:  Length: 10.1 cm. Echogenicity within normal limits. No mass or hydronephrosis visualized.  Left Kidney:  Length: 10.4 cm. No hydronephrosis. Mild perinephric edema or trace fluid adjacent the lower pole left kidney.  Bladder:  Collapsed around a Foley catheter.  Exam degraded by patient body habitus. Incidental note is made of a 2.6 cm gallstone and a left-sided pleural effusion.  IMPRESSION: 1.  No hydronephrosis.  No explanation for renal insufficiency. 2. Decreased sensitivity and specificity exam due to technique related factors, as described above. 3. Cholelithiasis. 4. Small left pleural effusion.   Electronically Signed   By: Abigail Miyamoto M.D.   On: 01/24/2015 10:56   Dg Chest Port 1 View  01/24/2015   CLINICAL DATA:  Wheezing and shortness of breath.  EXAM: PORTABLE CHEST - 1 VIEW  COMPARISON:  01/24/2015  FINDINGS: Left-sided PICC line tip overlies the superior vena cava. Heart is mildly prominent. There Kerley B-lines consistent with pulmonary edema. No focal consolidation. No overt  alveolar edema.  IMPRESSION: 1. Mild interstitial edema. 2. Left-sided PICC line tip to the superior vena cava.   Electronically Signed   By: Nolon Nations M.D.   On: 01/24/2015 22:14   Dg Chest Port 1 View  01/24/2015   CLINICAL DATA:  Hypoxia, pt was admitted with an acute URI and right armpit infection and was found to be in a-fib with RVR, hypovolemic, hyponatremic with an acute renal failure.  EXAM: PORTABLE CHEST - 1 VIEW  COMPARISON:  Radiograph 01/23/2015  FINDINGS: Left-sided PICC line with tip in distal SVC. Stable cardiac silhouette. There is mild central venous congestion. No focal infiltrate. No pulmonary edema. Mild basilar atelectasis.  IMPRESSION: Mild basilar atelectasis and minimal central venous congestion. No change from prior.   Electronically Signed   By: Nicole Kindred  Leonia Reeves M.D.   On: 01/24/2015 09:44   Dg Chest Port 1 View  01/23/2015   CLINICAL DATA:  Shortness of breath.  EXAM: PORTABLE CHEST - 1 VIEW  COMPARISON:  Same day.  FINDINGS: The heart size and mediastinal contours are within normal limits. Both lungs are clear. No pneumothorax or pleural effusion is noted. Left-sided PICC line is noted with tip in expected position of cavoatrial junction. The visualized skeletal structures are unremarkable.  IMPRESSION: No acute cardiopulmonary abnormality seen.   Electronically Signed   By: Marijo Conception, M.D.   On: 01/23/2015 18:52   Dg Chest Port 1 View  01/23/2015   CLINICAL DATA:  PICC line placement  EXAM: PORTABLE CHEST - 1 VIEW  COMPARISON:  CT scan of the chest same day  FINDINGS: Cardiomediastinal silhouette is unremarkable. No acute infiltrate or pulmonary edema. There is a left arm PICC line with tip in right atrium. For distal SVC position the PICC line should be retracted about 1 cm. No pneumothorax.  IMPRESSION: Left arm PICC line with tip in right atrium. For distal SVC position PICC line should be retracted about 1 cm. No pneumothorax.   Electronically Signed   By:  Lahoma Crocker M.D.   On: 01/23/2015 17:45   US Abdomen Limited Ruq  01/25/2015   CLINICAL DATA:  Upper abdominal pain  EXAM: US ABDOMEN LIMITED - RIGHT UPPER QUADRANT  COMPARISON:  None.  FINDINGS: Gallbladder:  There is a a focal gallstone measuring 1.9 cm in length which moves and shadows. Gallbladder wall is not thickened, and there is no pericholecystic fluid. No sonographic Murphy sign noted.  Common bile duct:  Diameter: 5 mm. No intrahepatic or extrahepatic biliary duct dilatation.  Liver:  No focal lesion identified. Liver echogenicity is diffusely increased.  There is a small right pleural effusion.  IMPRESSION: Cholelithiasis. Increased liver echogenicity, most likely due to hepatic steatosis. While no focal liver lesions are identified, it must be cautioned that the sensitivity of ultrasound for focal liver lesions is diminished in this circumstance. Small right pleural effusion noted.   Electronically Signed   By: Lowella Grip III M.D.   On: 01/25/2015 12:15    ASSESSMENT AND PLAN 1.  Paroxysmal atrial fibrillation.  Echocardiogram today shows good left ventricular systolic function with ejection fraction 55-60%.  There were no wall motion abnormalities.  There is mild mitral regurgitation and mild left atrial enlargement. 2.  Cholelithiasis.  If she requires surgery, I feel that she would not be a prohibitive risk. 3.  Thrombocytopenia 4.  Possible cellulitis or erysipelas of abdominal right lateral wall.  Signed, Darlin Coco MD

## 2015-01-25 NOTE — Progress Notes (Signed)
PULMONARY / CRITICAL CARE MEDICINE   Name: Amanda Wilkins MRN: [id number] DOB: [birth year]    ADMISSION DATE:  01/22/2015 CONSULTATION DATE:  01/24/15  REFERRING MD :  Dr. Broadus John   CHIEF COMPLAINT:  Sepsis   INITIAL PRESENTATION: 69 y/o F admitted 4/9 with a 2 day hx of N/V/D, fever/malaise and palpitations.  PCCM consulted for evaluation 4/11.  Hypotensive overnight & asked to see again  STUDIES:  4/10 CT Chest >> inflammation in R axilla extending inferiorly along R chest, no focal evidence of abscess or gas, mild dependent atx 4/11 CT Abd/Pelvis >> no renal obs, Gallbladder distention with possible internal gallstone identified 4/11 Renal US >> no hydronephrosis, cholelithiasis, small L pleural effusion   SIGNIFICANT EVENTS: 4/09  Admit with N/V/D. Lactic acid 3.6 4/11  PCCM consulted for sepsis.  Lactic acid 1.4 4/12 hypotensive on dilt gtt, tachy on dopamine gtt, some resp distress with fluids   HISTORY OF PRESENT ILLNESS:  69 y/o F, non-smoker,  with PMH of migraines, HTN, depression, ETOH (3 drinks daily but denies w/d sx if she does not drink) obestiy, arthritis and chronic interstitial nephritis who presented 4/9 with a 2 day hx of N/V/D, fever/malaise and palpitations.  Patient reports her symptoms began on Thursday 4/7.  Her daughter recently had a viral illness similar to her presenting symptoms.  Admit work up was notable for lactic acid of 3.6, WBC 9.5 and AKI. She also complained of R axilla / breast pain and CT of chest was obtained which noted inflammation but no gas or abscess.  Blood cultures assessed and growing 1/2 with GPC's.  Renal US negative for hydronephrosis but notable for cholelithiasis & small L pleural effusion.  Pt developed tachypnea / mild SOB.  She indicates she has been on abx in the past 3 months for UTI (feels like it was in February) and symptoms initially improved but then "came back with a vengeance" (burning, frequency, urgency).  PCCM consulted  for evaluation.    SUBJECTIVE: c/o pain in axilla Afebrile   VITAL SIGNS: Temp:  [97.2 F (36.2 C)-97.9 F (36.6 C)] 97.3 F (36.3 C) (04/12 0200) Pulse Rate:  [35-155] 105 (04/12 0445) Resp:  [21-36] 25 (04/12 0445) BP: (72-156)/(25-121) 116/80 mmHg (04/12 0445) SpO2:  [91 %-100 %] 98 % (04/12 0521) Weight:  [112.3 kg (247 lb 9.2 oz)] 112.3 kg (247 lb 9.2 oz) (04/12 0500)   HEMODYNAMICS:     VENTILATOR SETTINGS:     INTAKE / OUTPUT:  Intake/Output Summary (Last 24 hours) at 01/25/15 0973 Last data filed at 01/25/15 0400  Gross per 24 hour  Intake 1222.05 ml  Output    475 ml  Net 747.05 ml    PHYSICAL EXAMINATION: General:  Obese female in NAD Neuro:  AAOx4, speech clear, MAE -talking in her sleep HEENT:  MM pink/moist, short / thick neck  Cardiovascular:  s1s2 distant, irr irr  Lungs:  resp's even/non-labored, lungs bilaterally diminished lower posterior Abdomen:  Obese, soft, bsx4 active  Musculoskeletal:  No acute deformities  Skin:  Moist, warm, no overt edema   LABS:  CBC  Recent Labs Lab 01/22/15 1750 01/24/15 0500 01/24/15 2043  WBC 9.5 6.3 9.0  HGB 14.9 11.8* 11.6*  HCT 42.3 33.4* 32.4*  PLT 148* 84* 73*  74*   Coag's  Recent Labs Lab 01/24/15 2043  APTT 35  INR 1.18     BMET  Recent Labs Lab 01/22/15 1750 01/23/15 1230 01/24/15 0500  NA 127* 127*  125*  K 3.8 3.9 3.8  CL 94* 101 100  CO2 16* 13* 18*  BUN 32* 38* 52*  CREATININE 1.97* 2.41* 3.25*  GLUCOSE 156* 116* 107*   Electrolytes  Recent Labs Lab 01/22/15 1750 01/23/15 1230 01/24/15 0500  CALCIUM 8.6 7.3* 7.0*  MG 1.8  --   --   PHOS 3.3  --   --    Sepsis Markers  Recent Labs Lab 01/23/15 1900 01/24/15 1045 01/24/15 2043  LATICACIDVEN 3.3* 1.4 1.3  PROCALCITON  --   --  48.23   ABG  Recent Labs Lab 01/24/15 1130 01/24/15 2300  PHART 7.386 7.300*  PCO2ART 19.9* 22.8*  PO2ART 90.8 137.0*   Liver Enzymes  Recent Labs Lab 01/24/15 0500   AST 54*  ALT 39*  ALKPHOS 38*  BILITOT 1.4*  ALBUMIN 2.2*   Cardiac Enzymes  Recent Labs Lab 01/23/15 1050 01/23/15 1230  TROPONINI 0.03 <0.03   Glucose  Recent Labs Lab 01/24/15 2237  GLUCAP 89    Imaging Ct Abdomen Pelvis Wo Contrast  01/24/2015   CLINICAL DATA:  Abdominal pain, nausea, vomiting, diarrhea. Fever and chills. Sepsis.  EXAM: CT ABDOMEN AND PELVIS WITHOUT CONTRAST  TECHNIQUE: Multidetector CT imaging of the abdomen and pelvis was performed following the standard protocol without IV contrast.  COMPARISON:  No similar prior exam is available at this institution for comparison or on [facility].  FINDINGS: Lower chest: Small bilateral pleural effusions are noted. Interlobular septal thickening and bronchial wall thickening are suggestive of interstitial pulmonary edema.  Hepatobiliary: Hepatic hypodensity suggests steatosis. No focal hepatic abnormality or intrahepatic ductal dilatation. Gallbladder distention with probable dependent stone image 34.  Pancreas: Normal  Spleen: Normal  Adrenals/Urinary Tract: Adrenal glands appear normal. Moderate nonspecific bilateral perinephric stranding is identified. No hydroureteronephrosis. A Foley catheter is in place and the bladder is decompressed. No radiopaque renal or ureteral calculus. Renal parenchymal detail is obscured by lack of contrast.  Stomach/Bowel: Stomach appears normal. Normal appendix. No bowel wall thickening or focal segmental dilatation is identified.  Vascular/Lymphatic: No lymphadenopathy. Mild atheromatous aortic calcification identified.  Reproductive: Uterus and ovaries are unremarkable.  Other: Mild soft tissue anasarca is present which may suggest fluid overload. No free air or free intra-abdominal fluid.  Musculoskeletal: No acute osseous abnormality. L5-S1 disc degenerative change.  IMPRESSION: Findings at the lung bases suggest interstitial pulmonary edema with mild soft tissue anasarca.  Nonspecific  bilateral perinephric stranding without hydroureteronephrosis or radiopaque renal or ureteral calculus.  Gallbladder distention with possible internal gallstone identified. This again raises the question of possible cholecystitis in the setting of sepsis.   Electronically Signed   By: Conchita Paris M.D.   On: 01/24/2015 21:04   US Renal  01/24/2015   CLINICAL DATA:  Acute renal insufficiency.  EXAM: RENAL/URINARY TRACT ULTRASOUND COMPLETE  COMPARISON:  11/16/2009  FINDINGS: Right Kidney:  Length: 10.1 cm. Echogenicity within normal limits. No mass or hydronephrosis visualized.  Left Kidney:  Length: 10.4 cm. No hydronephrosis. Mild perinephric edema or trace fluid adjacent the lower pole left kidney.  Bladder:  Collapsed around a Foley catheter.  Exam degraded by patient body habitus. Incidental note is made of a 2.6 cm gallstone and a left-sided pleural effusion.  IMPRESSION: 1.  No hydronephrosis.  No explanation for renal insufficiency. 2. Decreased sensitivity and specificity exam due to technique related factors, as described above. 3. Cholelithiasis. 4. Small left pleural effusion.   Electronically Signed   By: Adria Devon.D.  On: 01/24/2015 10:56   Dg Chest Port 1 View  01/24/2015   CLINICAL DATA:  Wheezing and shortness of breath.  EXAM: PORTABLE CHEST - 1 VIEW  COMPARISON:  01/24/2015  FINDINGS: Left-sided PICC line tip overlies the superior vena cava. Heart is mildly prominent. There Kerley B-lines consistent with pulmonary edema. No focal consolidation. No overt alveolar edema.  IMPRESSION: 1. Mild interstitial edema. 2. Left-sided PICC line tip to the superior vena cava.   Electronically Signed   By: Nolon Nations M.D.   On: 01/24/2015 22:14   Dg Chest Port 1 View  01/24/2015   CLINICAL DATA:  Hypoxia, pt was admitted with an acute URI and right armpit infection and was found to be in a-fib with RVR, hypovolemic, hyponatremic with an acute renal failure.  EXAM: PORTABLE CHEST - 1 VIEW   COMPARISON:  Radiograph 01/23/2015  FINDINGS: Left-sided PICC line with tip in distal SVC. Stable cardiac silhouette. There is mild central venous congestion. No focal infiltrate. No pulmonary edema. Mild basilar atelectasis.  IMPRESSION: Mild basilar atelectasis and minimal central venous congestion. No change from prior.   Electronically Signed   By: Suzy Bouchard M.D.   On: 01/24/2015 09:44     ASSESSMENT / PLAN:  PULMONARY  A: Dependent Atelectasis  Tachypnea - in setting of sepsis / AKI Acute pulm edema High D-dimer, but doubt PE P:   Pulmonary hygiene Oxygen to support sats > 92% May need CPAP during sleep if desaturates Venous duplex ok, hold off VQ (ordered by primary team   CARDIOVASCULAR PICC LUE 4/10 >>  A:  Atrial Fibrillation - hx of PAF in past, currently related to sepsis of unclear etiology  Septic shock P:  Hold Atenolol, Cardizem per Cardiology for rate control ASA Neo gtt  RENAL A:   Acute Kidney Injury - in setting of sepsis Lactic Acidosis - resolving.  AG 7 Hyponatremia  P:   Hold nephrotoxic agents May need low dose bicarb gtt Renal consult called per primary SVC Monitor I/O's Renal dose abx/meds per pharmacy   GASTROINTESTINAL A:   Obesity N/V/D  Suspect Viral Gastroenteritis  P:   PRN Zofran  Enteric precautions  HEMATOLOGIC A:   Thrombocytopenia  P:  Trend CBC DVT: heparin sq  INFECTIOUS A:   Septic shock - GPC's. Favor cellulitis over  gastroenteritis or acute chole Nausea / Vomiting / Diarrhea Chest Wall Induration / R Breast / Axilla Pain  P:   BCx2 4/11 >> 1/2 GPC's in chains >>  C-Diff 4/11 >> neg Flu 4/11 >> neg  UA 4/10 >> few bacteria, 7-10 WBC  UC 4/11 >>   Vanco, start date 4/10, day 2/x Zosyn, start date 4/10, day 2/x   Repeat lactic acid reassuring  ENDOCRINE A:   Mild Hyperglycemia P:   Monitor glucose on BMP, if consistently > 180 add SSI   NEUROLOGIC A:  Acute encephalopathy - sleep  talking Depression  P:   RASS goal: n/a Continue zoloft, per primary SVC   FAMILY  - Updates: Patient updated at bedside.   Summary - septic shock - cellulitis , doubt acute chole  Volume status and tissue perfusion was assessed clinically by examination of cardiopulmonary system, good cap refill, goodperipheral pulses, good skin color (pink) and mucous membranes. Lactate clearance reasuring. Use neo gtt AKI worsening-  Conservative care   The patient is critically ill with multiple organ systems failure and requires high complexity decision making for assessment and support, frequent evaluation and  titration of therapies, application of advanced monitoring technologies and extensive interpretation of multiple databases. Critical Care Time devoted to patient care services described in this note independent of APP time is 35 minutes.    Rigoberto Noel, MD  Kara Mead MD. FCCP. [facility] Pager [phone] If no response call [phone]    01/25/2015, 6:23 AM

## 2015-01-25 NOTE — Progress Notes (Signed)
VASCULAR LAB PRELIMINARY  PRELIMINARY  PRELIMINARY  PRELIMINARY  Bilateral lower extremity venous Dopplers completed.  Right upper extremity venous Doppler completed.   Preliminary report:  There is no obvious evidence of  DVT or SVT noted in the bilateral lower extremities.  There is no obvious evidence of DVT or SVT noted in the right upper extremity.  Wyvonne Carda, RVT 01/25/2015, 11:13 AM

## 2015-01-25 NOTE — Progress Notes (Addendum)
Telephonic report given to Naida Sleight, RN 2S.  Pt transported to 2S06 on cardiac monitor and O2 at 4l [place].   Husband updated on plan of care at bedside, then went home for the night.

## 2015-01-25 NOTE — Progress Notes (Signed)
Assessment:  1 Nonoliguric worsening AKI, hemodynamically mediated due to afib, hypotension, bacteremia, COX-2 inhib 2 Group A strep 1/2 pos BC 3 Sepsis 4 Acidosis 5 Hyponatremia  Plan: 1 Supportive therapy 2 Sodium bicarbonate infusion  Subjective: Interval History: confused  Objective: Vital signs in last 24 hours: Temp:  [97.2 F (36.2 C)-97.9 F (36.6 C)] 97.7 F (36.5 C) (04/12 1256) Pulse Rate:  [35-155] 95 (04/12 1215) Resp:  [21-36] 30 (04/12 1215) BP: (72-190)/(25-156) 120/49 mmHg (04/12 1200) SpO2:  [91 %-100 %] 99 % (04/12 1215) Weight:  [112.3 kg (247 lb 9.2 oz)] 112.3 kg (247 lb 9.2 oz) (04/12 0500) Weight change:   Intake/Output from previous day: 04/11 0701 - 04/12 0700 In: 1547.1 [P.O.:480; I.V.:767.1; IV Piggyback:300] Out: 675 [Urine:675] Intake/Output this shift: Total I/O In: 25 [I.V.:25] Out: -   General appearance: confused Chest wall: no tenderness; right upper outer breast tender GI: soft, non-tender; bowel sounds normal; no masses,  no organomegaly Neurologic: confused but moves everything  Lab Results:  Recent Labs  01/24/15 2043 01/25/15 0500  WBC 9.0 12.2*  HGB 11.6* 11.7*  HCT 32.4* 32.9*  PLT 73*  74* 80*   BMET:  Recent Labs  01/24/15 0500 01/25/15 0500  NA 125* 125*  K 3.8 3.8  CL 100 97  CO2 18* 13*  GLUCOSE 107* 92  BUN 52* 66*  CREATININE 3.25* 4.06*  CALCIUM 7.0* 7.2*   No results for input(s): PTH in the last 72 hours. Iron Studies: No results for input(s): IRON, TIBC, TRANSFERRIN, FERRITIN in the last 72 hours. Studies/Results: Ct Abdomen Pelvis Wo Contrast  01/24/2015   CLINICAL DATA:  Abdominal pain, nausea, vomiting, diarrhea. Fever and chills. Sepsis.  EXAM: CT ABDOMEN AND PELVIS WITHOUT CONTRAST  TECHNIQUE: Multidetector CT imaging of the abdomen and pelvis was performed following the standard protocol without IV contrast.  COMPARISON:  No similar prior exam is available at this institution for  comparison or on [facility].  FINDINGS: Lower chest: Small bilateral pleural effusions are noted. Interlobular septal thickening and bronchial wall thickening are suggestive of interstitial pulmonary edema.  Hepatobiliary: Hepatic hypodensity suggests steatosis. No focal hepatic abnormality or intrahepatic ductal dilatation. Gallbladder distention with probable dependent stone image 34.  Pancreas: Normal  Spleen: Normal  Adrenals/Urinary Tract: Adrenal glands appear normal. Moderate nonspecific bilateral perinephric stranding is identified. No hydroureteronephrosis. A Foley catheter is in place and the bladder is decompressed. No radiopaque renal or ureteral calculus. Renal parenchymal detail is obscured by lack of contrast.  Stomach/Bowel: Stomach appears normal. Normal appendix. No bowel wall thickening or focal segmental dilatation is identified.  Vascular/Lymphatic: No lymphadenopathy. Mild atheromatous aortic calcification identified.  Reproductive: Uterus and ovaries are unremarkable.  Other: Mild soft tissue anasarca is present which may suggest fluid overload. No free air or free intra-abdominal fluid.  Musculoskeletal: No acute osseous abnormality. L5-S1 disc degenerative change.  IMPRESSION: Findings at the lung bases suggest interstitial pulmonary edema with mild soft tissue anasarca.  Nonspecific bilateral perinephric stranding without hydroureteronephrosis or radiopaque renal or ureteral calculus.  Gallbladder distention with possible internal gallstone identified. This again raises the question of possible cholecystitis in the setting of sepsis.   Electronically Signed   By: Conchita Paris M.D.   On: 01/24/2015 21:04   [place] Chest  01/25/2015   CLINICAL DATA:  69 year old female with right axillary and chest wall pain. Fever and sepsis. Initial encounter.  EXAM: CHEST ULTRASOUND  COMPARISON:  Chest CT 01/23/2015.  FINDINGS: Ultrasound imaging in  the area of clinical concern corresponding to the right  axilla and chest wall.  Lymph nodes with normal fatty hila. Diffuse subcutaneous edema. Evidence of edematous chest wall muscle (image 19) with separation of muscle fibers, but no discrete or drainable fluid collection.  IMPRESSION: Subcutaneous and chest wall muscle edema with no discrete or drainable fluid collection. Regional lymph nodes remain normal.   Electronically Signed   By: Genevie Ann M.D.   On: 01/25/2015 12:11   US Renal  01/24/2015   CLINICAL DATA:  Acute renal insufficiency.  EXAM: RENAL/URINARY TRACT ULTRASOUND COMPLETE  COMPARISON:  11/16/2009  FINDINGS: Right Kidney:  Length: 10.1 cm. Echogenicity within normal limits. No mass or hydronephrosis visualized.  Left Kidney:  Length: 10.4 cm. No hydronephrosis. Mild perinephric edema or trace fluid adjacent the lower pole left kidney.  Bladder:  Collapsed around a Foley catheter.  Exam degraded by patient body habitus. Incidental note is made of a 2.6 cm gallstone and a left-sided pleural effusion.  IMPRESSION: 1.  No hydronephrosis.  No explanation for renal insufficiency. 2. Decreased sensitivity and specificity exam due to technique related factors, as described above. 3. Cholelithiasis. 4. Small left pleural effusion.   Electronically Signed   By: Abigail Miyamoto M.D.   On: 01/24/2015 10:56   Dg Chest Port 1 View  01/24/2015   CLINICAL DATA:  Wheezing and shortness of breath.  EXAM: PORTABLE CHEST - 1 VIEW  COMPARISON:  01/24/2015  FINDINGS: Left-sided PICC line tip overlies the superior vena cava. Heart is mildly prominent. There Kerley B-lines consistent with pulmonary edema. No focal consolidation. No overt alveolar edema.  IMPRESSION: 1. Mild interstitial edema. 2. Left-sided PICC line tip to the superior vena cava.   Electronically Signed   By: Nolon Nations M.D.   On: 01/24/2015 22:14   Dg Chest Port 1 View  01/24/2015   CLINICAL DATA:  Hypoxia, pt was admitted with an acute URI and right armpit infection and was found to be in a-fib  with RVR, hypovolemic, hyponatremic with an acute renal failure.  EXAM: PORTABLE CHEST - 1 VIEW  COMPARISON:  Radiograph 01/23/2015  FINDINGS: Left-sided PICC line with tip in distal SVC. Stable cardiac silhouette. There is mild central venous congestion. No focal infiltrate. No pulmonary edema. Mild basilar atelectasis.  IMPRESSION: Mild basilar atelectasis and minimal central venous congestion. No change from prior.   Electronically Signed   By: Suzy Bouchard M.D.   On: 01/24/2015 09:44   Dg Chest Port 1 View  01/23/2015   CLINICAL DATA:  Shortness of breath.  EXAM: PORTABLE CHEST - 1 VIEW  COMPARISON:  Same day.  FINDINGS: The heart size and mediastinal contours are within normal limits. Both lungs are clear. No pneumothorax or pleural effusion is noted. Left-sided PICC line is noted with tip in expected position of cavoatrial junction. The visualized skeletal structures are unremarkable.  IMPRESSION: No acute cardiopulmonary abnormality seen.   Electronically Signed   By: Marijo Conception, M.D.   On: 01/23/2015 18:52   Dg Chest Port 1 View  01/23/2015   CLINICAL DATA:  PICC line placement  EXAM: PORTABLE CHEST - 1 VIEW  COMPARISON:  CT scan of the chest same day  FINDINGS: Cardiomediastinal silhouette is unremarkable. No acute infiltrate or pulmonary edema. There is a left arm PICC line with tip in right atrium. For distal SVC position the PICC line should be retracted about 1 cm. No pneumothorax.  IMPRESSION: Left arm PICC line with tip  in right atrium. For distal SVC position PICC line should be retracted about 1 cm. No pneumothorax.   Electronically Signed   By: Lahoma Crocker M.D.   On: 01/23/2015 17:45   US Abdomen Limited Ruq  01/25/2015   CLINICAL DATA:  Upper abdominal pain  EXAM: US ABDOMEN LIMITED - RIGHT UPPER QUADRANT  COMPARISON:  None.  FINDINGS: Gallbladder:  There is a a focal gallstone measuring 1.9 cm in length which moves and shadows. Gallbladder wall is not thickened, and there is no  pericholecystic fluid. No sonographic Murphy sign noted.  Common bile duct:  Diameter: 5 mm. No intrahepatic or extrahepatic biliary duct dilatation.  Liver:  No focal lesion identified. Liver echogenicity is diffusely increased.  There is a small right pleural effusion.  IMPRESSION: Cholelithiasis. Increased liver echogenicity, most likely due to hepatic steatosis. While no focal liver lesions are identified, it must be cautioned that the sensitivity of ultrasound for focal liver lesions is diminished in this circumstance. Small right pleural effusion noted.   Electronically Signed   By: Lowella Grip III M.D.   On: 01/25/2015 12:15   Scheduled: . antiseptic oral rinse  7 mL Mouth Rinse q12n4p  . aspirin EC  325 mg Oral Daily  . chlorhexidine  15 mL Mouth Rinse BID  . clindamycin (CLEOCIN) IV  900 mg Intravenous 3 times per day  . estrogens (conjugated)  0.3 mg Oral Q48H  . heparin  5,000 Units Subcutaneous 3 times per day  . ipratropium-albuterol  3 mL Nebulization Q4H  . LORazepam  1 mg Oral Once  . piperacillin-tazobactam (ZOSYN)  IV  2.25 g Intravenous 4 times per day  . sertraline  50 mg Oral Daily  . sodium chloride  3 mL Intravenous Q12H    LOS: 2 days   Nayzeth Altman C 01/25/2015,1:51 PM

## 2015-01-25 NOTE — Progress Notes (Signed)
Pt with worsening SOB and diaphoretic. Pt became hypotensive with SBP in the 80s. Cardizem drip stopped and Baltazar Najjar, NP paged. CXR results reviewed with practitioner.

## 2015-01-25 NOTE — Progress Notes (Signed)
Echocardiogram 2D Echocardiogram has been performed.  Amanda Wilkins 01/25/2015, 10:03 AM

## 2015-01-25 NOTE — Progress Notes (Signed)
Patient's right flank noted to be reddened more so than earlier, and hot to touch.  Blister like bumps noted to area.  E-Link RN made aware and will have doctor to camera in to look at it when available.

## 2015-01-26 ENCOUNTER — Inpatient Hospital Stay (HOSPITAL_COMMUNITY): Payer: Medicare Other

## 2015-01-26 DIAGNOSIS — M25519 Pain in unspecified shoulder: Secondary | ICD-10-CM

## 2015-01-26 DIAGNOSIS — R7989 Other specified abnormal findings of blood chemistry: Secondary | ICD-10-CM | POA: Diagnosis present

## 2015-01-26 LAB — HAPTOGLOBIN: Haptoglobin: 278 mg/dL — ABNORMAL HIGH (ref 34–200)

## 2015-01-26 LAB — CBC
HCT: 28.6 % — ABNORMAL LOW (ref 36.0–46.0)
Hemoglobin: 10.5 g/dL — ABNORMAL LOW (ref 12.0–15.0)
MCH: 34.1 pg — ABNORMAL HIGH (ref 26.0–34.0)
MCHC: 36.7 g/dL — ABNORMAL HIGH (ref 30.0–36.0)
MCV: 92.9 fL (ref 78.0–100.0)
PLATELETS: 67 10*3/uL — AB (ref 150–400)
RBC: 3.08 MIL/uL — AB (ref 3.87–5.11)
RDW: 13.8 % (ref 11.5–15.5)
WBC: 12.5 10*3/uL — AB (ref 4.0–10.5)

## 2015-01-26 LAB — BASIC METABOLIC PANEL
ANION GAP: 17 — AB (ref 5–15)
BUN: 79 mg/dL — AB (ref 6–23)
CALCIUM: 7.4 mg/dL — AB (ref 8.4–10.5)
CO2: 15 mmol/L — ABNORMAL LOW (ref 19–32)
CREATININE: 4.66 mg/dL — AB (ref 0.50–1.10)
Chloride: 94 mmol/L — ABNORMAL LOW (ref 96–112)
GFR calc non Af Amer: 9 mL/min — ABNORMAL LOW (ref >90)
GFR, EST AFRICAN AMERICAN: 10 mL/min — AB (ref >90)
Glucose, Bld: 111 mg/dL — ABNORMAL HIGH (ref 70–99)
Potassium: 3 mmol/L — ABNORMAL LOW (ref 3.5–5.1)
Sodium: 126 mmol/L — ABNORMAL LOW (ref 135–145)

## 2015-01-26 LAB — MAGNESIUM: MAGNESIUM: 2.4 mg/dL (ref 1.5–2.5)

## 2015-01-26 LAB — GLUCOSE, CAPILLARY: Glucose-Capillary: 128 mg/dL — ABNORMAL HIGH (ref 70–99)

## 2015-01-26 LAB — URINE CULTURE
Colony Count: NO GROWTH
Culture: NO GROWTH

## 2015-01-26 LAB — PHOSPHORUS: Phosphorus: 5.6 mg/dL — ABNORMAL HIGH (ref 2.3–4.6)

## 2015-01-26 LAB — PROCALCITONIN: Procalcitonin: 37.72 ng/mL

## 2015-01-26 LAB — PATHOLOGIST SMEAR REVIEW

## 2015-01-26 MED ORDER — POTASSIUM CHLORIDE 20 MEQ/15ML (10%) PO SOLN
40.0000 meq | Freq: Once | ORAL | Status: AC
  Administered 2015-01-26: 40 meq via ORAL
  Filled 2015-01-26: qty 30

## 2015-01-26 MED ORDER — POTASSIUM CHLORIDE 10 MEQ/50ML IV SOLN
10.0000 meq | INTRAVENOUS | Status: AC
  Administered 2015-01-26 (×2): 10 meq via INTRAVENOUS

## 2015-01-26 MED ORDER — TECHNETIUM TC 99M MEBROFENIN IV KIT
5.0000 | PACK | Freq: Once | INTRAVENOUS | Status: AC | PRN
  Administered 2015-01-26: 5 via INTRAVENOUS

## 2015-01-26 MED ORDER — MORPHINE SULFATE 4 MG/ML IJ SOLN
4.5000 mg | Freq: Once | INTRAMUSCULAR | Status: AC
  Administered 2015-01-26: 4.5 mg via INTRAVENOUS

## 2015-01-26 MED ORDER — CEFTRIAXONE SODIUM IN DEXTROSE 40 MG/ML IV SOLN
2.0000 g | INTRAVENOUS | Status: AC
  Administered 2015-01-26 – 2015-02-06 (×12): 2 g via INTRAVENOUS
  Filled 2015-01-26 (×12): qty 50

## 2015-01-26 MED ORDER — POTASSIUM CHLORIDE 10 MEQ/50ML IV SOLN
INTRAVENOUS | Status: AC
  Filled 2015-01-26: qty 100

## 2015-01-26 MED ORDER — POTASSIUM CHLORIDE 10 MEQ/100ML IV SOLN: 10.0000 meq | INTRAVENOUS | Status: DC

## 2015-01-26 MED ORDER — MORPHINE SULFATE 4 MG/ML IJ SOLN
INTRAMUSCULAR | Status: AC
  Filled 2015-01-26: qty 2

## 2015-01-26 NOTE — Progress Notes (Signed)
[facility] Progress Note Patient Name: Amanda Wilkins DOB: 11/17/1945 MRN: [id number]   Date of Service  01/26/2015  HPI/Events of Note  Requested by Dr. Joya Gaskins to cancel transfer to step down. Dr. Joya Gaskins feels patient still has issues that need to be addressed in the ICU.  eICU Interventions  D/C transfer to step down unit.      Intervention Category Minor Interventions: Routine modifications to care plan (e.g. PRN medications for pain, fever)  Hydeia Mcatee Eugene 01/26/2015, 5:37 PM

## 2015-01-26 NOTE — Progress Notes (Signed)
Assessment:  1 Nonoliguric worsening AKI, hemodynamically mediated due to afib, hypotension, bacteremia, COX-2 inhib.  Mild uremia 2 Group A strep 1/2 pos BC 3 Sepsis 4 Acidosis 5 Hyponatremia 6 Hypokalemia  Plan: 1 Supportive therapy 2 K replace   Subjective: Interval History:   Objective: Vital signs in last 24 hours: Temp:  [97.6 F (36.4 C)-98.4 F (36.9 C)] 97.6 F (36.4 C) (04/13 0425) Pulse Rate:  [25-121] 120 (04/13 0600) Resp:  [22-35] 26 (04/13 0600) BP: (95-142)/(45-98) 107/72 mmHg (04/13 0600) SpO2:  [69 %-100 %] 98 % (04/13 0809) Weight:  [112.3 kg (247 lb 9.2 oz)] 112.3 kg (247 lb 9.2 oz) (04/13 0600) Weight change: 0 kg (0 lb)  Intake/Output from previous day: 04/12 0701 - 04/13 0700 In: 1220.8 [I.V.:870.8; IV Piggyback:350] Out: 1000 [Urine:1000] Intake/Output this shift:    General appearance: cooperative Resp: clear to auscultation bilaterally Chest wall: right side with tenderness and erythema Breasts: tender upper outer but less Cardio: regular rate and rhythm, S1, S2 normal, no murmur, click, rub or gallop Skin: erythema right side upper thorax  Lab Results:  Recent Labs  01/25/15 0500 01/26/15 0400  WBC 12.2* 12.5*  HGB 11.7* 10.5*  HCT 32.9* 28.6*  PLT 80* 67*   BMET:  Recent Labs  01/25/15 0500 01/26/15 0400  NA 125* 126*  K 3.8 3.0*  CL 97 94*  CO2 13* 15*  GLUCOSE 92 111*  BUN 66* 79*  CREATININE 4.06* 4.66*  CALCIUM 7.2* 7.4*   No results for input(s): PTH in the last 72 hours. Iron Studies: No results for input(s): IRON, TIBC, TRANSFERRIN, FERRITIN in the last 72 hours. Studies/Results: Ct Abdomen Pelvis Wo Contrast  01/24/2015   CLINICAL DATA:  Abdominal pain, nausea, vomiting, diarrhea. Fever and chills. Sepsis.  EXAM: CT ABDOMEN AND PELVIS WITHOUT CONTRAST  TECHNIQUE: Multidetector CT imaging of the abdomen and pelvis was performed following the standard protocol without IV contrast.  COMPARISON:  No similar prior  exam is available at this institution for comparison or on [facility].  FINDINGS: Lower chest: Small bilateral pleural effusions are noted. Interlobular septal thickening and bronchial wall thickening are suggestive of interstitial pulmonary edema.  Hepatobiliary: Hepatic hypodensity suggests steatosis. No focal hepatic abnormality or intrahepatic ductal dilatation. Gallbladder distention with probable dependent stone image 34.  Pancreas: Normal  Spleen: Normal  Adrenals/Urinary Tract: Adrenal glands appear normal. Moderate nonspecific bilateral perinephric stranding is identified. No hydroureteronephrosis. A Foley catheter is in place and the bladder is decompressed. No radiopaque renal or ureteral calculus. Renal parenchymal detail is obscured by lack of contrast.  Stomach/Bowel: Stomach appears normal. Normal appendix. No bowel wall thickening or focal segmental dilatation is identified.  Vascular/Lymphatic: No lymphadenopathy. Mild atheromatous aortic calcification identified.  Reproductive: Uterus and ovaries are unremarkable.  Other: Mild soft tissue anasarca is present which may suggest fluid overload. No free air or free intra-abdominal fluid.  Musculoskeletal: No acute osseous abnormality. L5-S1 disc degenerative change.  IMPRESSION: Findings at the lung bases suggest interstitial pulmonary edema with mild soft tissue anasarca.  Nonspecific bilateral perinephric stranding without hydroureteronephrosis or radiopaque renal or ureteral calculus.  Gallbladder distention with possible internal gallstone identified. This again raises the question of possible cholecystitis in the setting of sepsis.   Electronically Signed   By: Conchita Paris M.D.   On: 01/24/2015 21:04   [place] Chest  01/25/2015   CLINICAL DATA:  69 year old female with right axillary and chest wall pain. Fever and sepsis. Initial encounter.  EXAM:  CHEST ULTRASOUND  COMPARISON:  Chest CT 01/23/2015.  FINDINGS: Ultrasound imaging in the area of  clinical concern corresponding to the right axilla and chest wall.  Lymph nodes with normal fatty hila. Diffuse subcutaneous edema. Evidence of edematous chest wall muscle (image 19) with separation of muscle fibers, but no discrete or drainable fluid collection.  IMPRESSION: Subcutaneous and chest wall muscle edema with no discrete or drainable fluid collection. Regional lymph nodes remain normal.   Electronically Signed   By: Genevie Ann M.D.   On: 01/25/2015 12:11   Dg Chest Port 1 View  01/24/2015   CLINICAL DATA:  Wheezing and shortness of breath.  EXAM: PORTABLE CHEST - 1 VIEW  COMPARISON:  01/24/2015  FINDINGS: Left-sided PICC line tip overlies the superior vena cava. Heart is mildly prominent. There Kerley B-lines consistent with pulmonary edema. No focal consolidation. No overt alveolar edema.  IMPRESSION: 1. Mild interstitial edema. 2. Left-sided PICC line tip to the superior vena cava.   Electronically Signed   By: Nolon Nations M.D.   On: 01/24/2015 22:14   US Abdomen Limited Ruq  01/25/2015   CLINICAL DATA:  Upper abdominal pain  EXAM: US ABDOMEN LIMITED - RIGHT UPPER QUADRANT  COMPARISON:  None.  FINDINGS: Gallbladder:  There is a a focal gallstone measuring 1.9 cm in length which moves and shadows. Gallbladder wall is not thickened, and there is no pericholecystic fluid. No sonographic Murphy sign noted.  Common bile duct:  Diameter: 5 mm. No intrahepatic or extrahepatic biliary duct dilatation.  Liver:  No focal lesion identified. Liver echogenicity is diffusely increased.  There is a small right pleural effusion.  IMPRESSION: Cholelithiasis. Increased liver echogenicity, most likely due to hepatic steatosis. While no focal liver lesions are identified, it must be cautioned that the sensitivity of ultrasound for focal liver lesions is diminished in this circumstance. Small right pleural effusion noted.   Electronically Signed   By: Lowella Grip III M.D.   On: 01/25/2015 12:15     Scheduled: . antiseptic oral rinse  7 mL Mouth Rinse q12n4p  . aspirin EC  325 mg Oral Daily  . chlorhexidine  15 mL Mouth Rinse BID  . clindamycin (CLEOCIN) IV  900 mg Intravenous 3 times per day  . estrogens (conjugated)  0.3 mg Oral Q48H  . heparin  5,000 Units Subcutaneous 3 times per day  . ipratropium-albuterol  3 mL Nebulization Q4H  . LORazepam  1 mg Oral Once  . piperacillin-tazobactam (ZOSYN)  IV  2.25 g Intravenous 4 times per day  . potassium chloride  10 mEq Intravenous Q1 Hr x 2  . sertraline  50 mg Oral Daily  . sodium chloride  3 mL Intravenous Q12H     LOS: 3 days   Luke Falero C 01/26/2015,10:43 AM

## 2015-01-26 NOTE — Progress Notes (Signed)
[facility] Progress Note     Subjective: Pt c/o RUQ abdominal pain, right flank/side pain and red eruptive blistering rash from right breast to right groin.  Not currently c/o nausea/vomiting.    Objective: Vital signs in last 24 hours: Temp:  [97.6 F (36.4 C)-98.4 F (36.9 C)] 97.6 F (36.4 C) (04/13 0425) Pulse Rate:  [25-127] 118 (04/13 1100) Resp:  [18-31] 21 (04/13 1100) BP: (95-142)/(49-118) 136/111 mmHg (04/13 1100) SpO2:  [69 %-100 %] 98 % (04/13 1100) Weight:  [112.3 kg (247 lb 9.2 oz)] 112.3 kg (247 lb 9.2 oz) (04/13 0600) Last BM Date: 01/25/15 (smear)  Intake/Output from previous day: 04/12 0701 - 04/13 0700 In: 1220.8 [I.V.:870.8; IV Piggyback:350] Out: 1000 [Urine:1000] Intake/Output this shift: Total I/O In: 240 [I.V.:240] Out: 200 [Urine:200]  PE: Gen:  Alert, NAD, pleasant Card:  RRR, no M/G/R heard Pulm:  CTA, no W/R/R, poor effort Abd: Obese, soft, mild distension, mild tenderness in RUQ, +BS, no HSM Skin:  Large indurated patch on her right flank/side extending from under right breast to right groin, blistering lesions (like anasarca - ish)   Lab Results:   Recent Labs  01/25/15 0500 01/26/15 0400  WBC 12.2* 12.5*  HGB 11.7* 10.5*  HCT 32.9* 28.6*  PLT 80* 67*   BMET  Recent Labs  01/25/15 0500 01/26/15 0400  NA 125* 126*  K 3.8 3.0*  CL 97 94*  CO2 13* 15*  GLUCOSE 92 111*  BUN 66* 79*  CREATININE 4.06* 4.66*  CALCIUM 7.2* 7.4*   PT/INR  Recent Labs  01/24/15 2043  LABPROT 15.1  INR 1.18   CMP     Component Value Date/Time   NA 126* 01/26/2015 0400   K 3.0* 01/26/2015 0400   CL 94* 01/26/2015 0400   CO2 15* 01/26/2015 0400   GLUCOSE 111* 01/26/2015 0400   BUN 79* 01/26/2015 0400   CREATININE 4.66* 01/26/2015 0400   CREATININE 0.68 05/27/2014 0923   CALCIUM 7.4* 01/26/2015 0400   PROT 5.0* 01/25/2015 0500   ALBUMIN 2.0* 01/25/2015 0500   AST 38* 01/25/2015 0500   ALT 37* 01/25/2015 0500   ALKPHOS  41 01/25/2015 0500   BILITOT 1.2 01/25/2015 0500   GFRNONAA 9* 01/26/2015 0400   GFRAA 10* 01/26/2015 0400   Lipase     Component Value Date/Time   LIPASE 36.0 11/10/2013 1541       Studies/Results: Ct Abdomen Pelvis Wo Contrast  01/24/2015   CLINICAL DATA:  Abdominal pain, nausea, vomiting, diarrhea. Fever and chills. Sepsis.  EXAM: CT ABDOMEN AND PELVIS WITHOUT CONTRAST  TECHNIQUE: Multidetector CT imaging of the abdomen and pelvis was performed following the standard protocol without IV contrast.  COMPARISON:  No similar prior exam is available at this institution for comparison or on [facility].  FINDINGS: Lower chest: Small bilateral pleural effusions are noted. Interlobular septal thickening and bronchial wall thickening are suggestive of interstitial pulmonary edema.  Hepatobiliary: Hepatic hypodensity suggests steatosis. No focal hepatic abnormality or intrahepatic ductal dilatation. Gallbladder distention with probable dependent stone image 34.  Pancreas: Normal  Spleen: Normal  Adrenals/Urinary Tract: Adrenal glands appear normal. Moderate nonspecific bilateral perinephric stranding is identified. No hydroureteronephrosis. A Foley catheter is in place and the bladder is decompressed. No radiopaque renal or ureteral calculus. Renal parenchymal detail is obscured by lack of contrast.  Stomach/Bowel: Stomach appears normal. Normal appendix. No bowel wall thickening or focal segmental dilatation is identified.  Vascular/Lymphatic: No lymphadenopathy. Mild atheromatous aortic calcification identified.  Reproductive: Uterus and ovaries are unremarkable.  Other: Mild soft tissue anasarca is present which may suggest fluid overload. No free air or free intra-abdominal fluid.  Musculoskeletal: No acute osseous abnormality. L5-S1 disc degenerative change.  IMPRESSION: Findings at the lung bases suggest interstitial pulmonary edema with mild soft tissue anasarca.  Nonspecific bilateral perinephric  stranding without hydroureteronephrosis or radiopaque renal or ureteral calculus.  Gallbladder distention with possible internal gallstone identified. This again raises the question of possible cholecystitis in the setting of sepsis.   Electronically Signed   By: Conchita Paris M.D.   On: 01/24/2015 21:04   [place] Chest  01/25/2015   CLINICAL DATA:  69 year old female with right axillary and chest wall pain. Fever and sepsis. Initial encounter.  EXAM: CHEST ULTRASOUND  COMPARISON:  Chest CT 01/23/2015.  FINDINGS: Ultrasound imaging in the area of clinical concern corresponding to the right axilla and chest wall.  Lymph nodes with normal fatty hila. Diffuse subcutaneous edema. Evidence of edematous chest wall muscle (image 19) with separation of muscle fibers, but no discrete or drainable fluid collection.  IMPRESSION: Subcutaneous and chest wall muscle edema with no discrete or drainable fluid collection. Regional lymph nodes remain normal.   Electronically Signed   By: Genevie Ann M.D.   On: 01/25/2015 12:11   Dg Chest Port 1 View  01/24/2015   CLINICAL DATA:  Wheezing and shortness of breath.  EXAM: PORTABLE CHEST - 1 VIEW  COMPARISON:  01/24/2015  FINDINGS: Left-sided PICC line tip overlies the superior vena cava. Heart is mildly prominent. There Kerley B-lines consistent with pulmonary edema. No focal consolidation. No overt alveolar edema.  IMPRESSION: 1. Mild interstitial edema. 2. Left-sided PICC line tip to the superior vena cava.   Electronically Signed   By: Nolon Nations M.D.   On: 01/24/2015 22:14   US Abdomen Limited Ruq  01/25/2015   CLINICAL DATA:  Upper abdominal pain  EXAM: US ABDOMEN LIMITED - RIGHT UPPER QUADRANT  COMPARISON:  None.  FINDINGS: Gallbladder:  There is a a focal gallstone measuring 1.9 cm in length which moves and shadows. Gallbladder wall is not thickened, and there is no pericholecystic fluid. No sonographic Murphy sign noted.  Common bile duct:  Diameter: 5 mm. No  intrahepatic or extrahepatic biliary duct dilatation.  Liver:  No focal lesion identified. Liver echogenicity is diffusely increased.  There is a small right pleural effusion.  IMPRESSION: Cholelithiasis. Increased liver echogenicity, most likely due to hepatic steatosis. While no focal liver lesions are identified, it must be cautioned that the sensitivity of ultrasound for focal liver lesions is diminished in this circumstance. Small right pleural effusion noted.   Electronically Signed   By: Lowella Grip III M.D.   On: 01/25/2015 12:15    Anti-infectives: Anti-infectives    Start     Dose/Rate Route Frequency Ordered Stop   01/25/15 1200  vancomycin (VANCOCIN) 1,500 mg in sodium chloride 0.9 % 500 mL IVPB  Status:  Discontinued     1,500 mg 250 mL/hr over 120 Minutes Intravenous Every 48 hours 01/23/15 1417 01/25/15 1015   01/25/15 1100  clindamycin (CLEOCIN) IVPB 900 mg     900 mg 100 mL/hr over 30 Minutes Intravenous 3 times per day 01/25/15 1014     01/24/15 1600  doxycycline (VIBRAMYCIN) 100 mg in dextrose 5 % 250 mL IVPB  Status:  Discontinued     100 mg 125 mL/hr over 120 Minutes Intravenous Every 12 hours 01/24/15 1500 01/25/15 1013  01/24/15 1400  piperacillin-tazobactam (ZOSYN) IVPB 2.25 g     2.25 g 100 mL/hr over 30 Minutes Intravenous 4 times per day 01/24/15 1148     01/24/15 1000  vancomycin (VANCOCIN) IVPB 1000 mg/200 mL premix  Status:  Discontinued     1,000 mg 200 mL/hr over 60 Minutes Intravenous Every 24 hours 01/23/15 0846 01/23/15 1417   01/23/15 2200  piperacillin-tazobactam (ZOSYN) IVPB 3.375 g  Status:  Discontinued     3.375 g 12.5 mL/hr over 240 Minutes Intravenous Every 8 hours 01/23/15 0846 01/24/15 1148   01/23/15 1000  oseltamivir (TAMIFLU) capsule 30 mg  Status:  Discontinued     30 mg Oral 2 times daily 01/23/15 0803 01/23/15 1413   01/23/15 0900  vancomycin (VANCOCIN) 2,000 mg in sodium chloride 0.9 % 500 mL IVPB     2,000 mg 250 mL/hr over 120  Minutes Intravenous  Once 01/23/15 0846 01/23/15 1401   01/23/15 0845  piperacillin-tazobactam (ZOSYN) IVPB 3.375 g     3.375 g 100 mL/hr over 30 Minutes Intravenous  Once 01/23/15 0846 01/23/15 2013       Assessment/Plan Cholelithiasis with possible cholecystitis  Abdominal pain  -Await HIDA report, not back yet, before decision for surgery or perc chole tube is made, Dr. Brantley Stage to see -Group A strep bacteremia would be unusual to be from gallbladder -Ice chips/sips of clears for now, if HIDA negative may be able to start clear tray -No breast/axillary abscess in [place] Group A strep bacteremia New flank erythema/induration/blistering -Looks like erysipelas compared to cellulitis of her right flank or could be toxic shock due to GAS Septic shock Afib with RVR Right sided chest wall/axillary region pain Acute renal failure SOB/tachypnea      LOS: 3 days    DORT, Nikka Hakimian 01/26/2015, 2:05 PM Pager: [phone]

## 2015-01-26 NOTE — Progress Notes (Addendum)
PULMONARY / CRITICAL CARE MEDICINE   Name: Amanda Wilkins MRN: [id number] DOB: Feb 27, 1946    ADMISSION DATE:  01/22/2015 CONSULTATION DATE:  01/24/15  REFERRING MD :  Dr. Broadus John   CHIEF COMPLAINT:  Sepsis   INITIAL PRESENTATION: 69 y/o F admitted 4/9 with a 2 day hx of N/V/D, fever/malaise and palpitations.  PCCM consulted for evaluation 4/11.  Hypotensive overnight & asked to see again  STUDIES:  4/10 CT Chest >> inflammation in R axilla extending inferiorly along R chest, no focal evidence of abscess or gas, mild dependent atx 4/11 CT Abd/Pelvis >> no renal obs, Gallbladder distention with possible internal gallstone identified 4/11 Renal US >> no hydronephrosis, cholelithiasis, small L pleural effusion   SIGNIFICANT EVENTS: 4/09  Admit with N/V/D. Lactic acid 3.6 4/11  PCCM consulted for sepsis.  Lactic acid 1.4 4/12 hypotensive on dilt gtt, tachy on dopamine gtt, some resp distress with fluids  HISTORY OF PRESENT ILLNESS:  69 y/o F, non-smoker,  with PMH of migraines, HTN, depression, ETOH (3 drinks daily but denies w/d sx if she does not drink) obestiy, arthritis and chronic interstitial nephritis who presented 4/9 with a 2 day hx of N/V/D, fever/malaise and palpitations.  Patient reports her symptoms began on Thursday 4/7.  Her daughter recently had a viral illness similar to her presenting symptoms.  Admit work up was notable for lactic acid of 3.6, WBC 9.5 and AKI. She also complained of R axilla / breast pain and CT of chest was obtained which noted inflammation but no gas or abscess.  Blood cultures assessed and growing 1/2 with GPC's.  Renal US negative for hydronephrosis but notable for cholelithiasis & small L pleural effusion.  Pt developed tachypnea / mild SOB.  She indicates she has been on abx in the past 3 months for UTI (feels like it was in February) and symptoms initially improved but then "came back with a vengeance" (burning, frequency, urgency).  PCCM consulted for  evaluation.   SUBJECTIVE: Off pressors, doing well.  VITAL SIGNS: Temp:  [97.6 F (36.4 C)-98.4 F (36.9 C)] 97.6 F (36.4 C) (04/13 0425) Pulse Rate:  [25-121] 120 (04/13 0600) Resp:  [22-35] 26 (04/13 0600) BP: (95-190)/(45-156) 107/72 mmHg (04/13 0600) SpO2:  [69 %-100 %] 98 % (04/13 0809) Weight:  [112.3 kg (247 lb 9.2 oz)] 112.3 kg (247 lb 9.2 oz) (04/13 0600)   HEMODYNAMICS:    VENTILATOR SETTINGS:     INTAKE / OUTPUT:  Intake/Output Summary (Last 24 hours) at 01/26/15 1010 Last data filed at 01/26/15 0600  Gross per 24 hour  Intake 1195.83 ml  Output   1000 ml  Net 195.83 ml   PHYSICAL EXAMINATION: General:  Obese female in NAD Neuro:  AAOx4, speech clear, MAE to command HEENT:  MM pink/moist, short / thick neck, PERRL, EOM-I and MMM Cardiovascular:  s1s2 distant, irr irr  Lungs:  resp's even/non-labored, lungs bilaterally diminished lower posterior Abdomen:  Obese, soft, bsx4 active  Musculoskeletal:  No acute deformities  Skin:  Moist, warm, no overt edema except at the right flank where there is erythema and fluctuant skin.  LABS:  CBC  Recent Labs Lab 01/24/15 2043 01/25/15 0500 01/26/15 0400  WBC 9.0 12.2* 12.5*  HGB 11.6* 11.7* 10.5*  HCT 32.4* 32.9* 28.6*  PLT 73*  74* 80* 67*   Coag's  Recent Labs Lab 01/24/15 2043  APTT 35  INR 1.18    BMET  Recent Labs Lab 01/24/15 0500 01/25/15 0500 01/26/15 0400  NA 125* 125* 126*  K 3.8 3.8 3.0*  CL 100 97 94*  CO2 18* 13* 15*  BUN 52* 66* 79*  CREATININE 3.25* 4.06* 4.66*  GLUCOSE 107* 92 111*   Electrolytes  Recent Labs Lab 01/22/15 1750  01/24/15 0500 01/25/15 0500 01/26/15 0400  CALCIUM 8.6  < > 7.0* 7.2* 7.4*  MG 1.8  --   --   --  2.4  PHOS 3.3  --   --   --  5.6*  < > = values in this interval not displayed. Sepsis Markers  Recent Labs Lab 01/23/15 1900 01/24/15 1045 01/24/15 2043 01/26/15 0400  LATICACIDVEN 3.3* 1.4 1.3  --   PROCALCITON  --   --  48.23 37.72    ABG  Recent Labs Lab 01/24/15 1130 01/24/15 2300  PHART 7.386 7.300*  PCO2ART 19.9* 22.8*  PO2ART 90.8 137.0*   Liver Enzymes  Recent Labs Lab 01/24/15 0500 01/25/15 0500  AST 54* 38*  ALT 39* 37*  ALKPHOS 38* 41  BILITOT 1.4* 1.2  ALBUMIN 2.2* 2.0*   Cardiac Enzymes  Recent Labs Lab 01/23/15 1050 01/23/15 1230  TROPONINI 0.03 <0.03   Glucose  Recent Labs Lab 01/24/15 2237  GLUCAP 89   Imaging [place] Chest  01/25/2015   CLINICAL DATA:  69 year old female with right axillary and chest wall pain. Fever and sepsis. Initial encounter.  EXAM: CHEST ULTRASOUND  COMPARISON:  Chest CT 01/23/2015.  FINDINGS: Ultrasound imaging in the area of clinical concern corresponding to the right axilla and chest wall.  Lymph nodes with normal fatty hila. Diffuse subcutaneous edema. Evidence of edematous chest wall muscle (image 19) with separation of muscle fibers, but no discrete or drainable fluid collection.  IMPRESSION: Subcutaneous and chest wall muscle edema with no discrete or drainable fluid collection. Regional lymph nodes remain normal.   Electronically Signed   By: Genevie Ann M.D.   On: 01/25/2015 12:11   US Abdomen Limited Ruq  01/25/2015   CLINICAL DATA:  Upper abdominal pain  EXAM: US ABDOMEN LIMITED - RIGHT UPPER QUADRANT  COMPARISON:  None.  FINDINGS: Gallbladder:  There is a a focal gallstone measuring 1.9 cm in length which moves and shadows. Gallbladder wall is not thickened, and there is no pericholecystic fluid. No sonographic Murphy sign noted.  Common bile duct:  Diameter: 5 mm. No intrahepatic or extrahepatic biliary duct dilatation.  Liver:  No focal lesion identified. Liver echogenicity is diffusely increased.  There is a small right pleural effusion.  IMPRESSION: Cholelithiasis. Increased liver echogenicity, most likely due to hepatic steatosis. While no focal liver lesions are identified, it must be cautioned that the sensitivity of ultrasound for focal liver lesions is  diminished in this circumstance. Small right pleural effusion noted.   Electronically Signed   By: Lowella Grip III M.D.   On: 01/25/2015 12:15   ASSESSMENT / PLAN:  PULMONARY  A: Persistent dependent Atelectasis  Tachypnea - metabolic acidosis Acute pulm edema High D-dimer, but doubt PE, will not scan given renal function. I reviewed CXR myself, mild pulmonary vascular congestion and cardiomegally but otherwise WNL. P:   Pulmonary hygiene Oxygen to support sats > 92% May need CPAP during sleep if desaturates Venous duplex neg prelim but not finalized yet, hold off VQ (ordered by primary team) Will start IS per RT protocol to combat atelectasis.  CARDIOVASCULAR PICC LUE 4/10 >>  A:  Persistent atrial Fibrillation - hx of PAF in past, currently related to sepsis of unclear  etiology  Septic shock P:  Change cardizem to PO when able to control HR. ASA. D/C neo. Cardizem IV for now for rate control for problem mentioned above.  RENAL A:   Acute Kidney Injury - in setting of sepsis Lactic Acidosis - resolving.  AG 7 Hyponatremia  Persistent metabolic acidosis. P:   Hold nephrotoxic agents. Will continue bicarb drip @50  ml/hr. Renal consult appreciated, hold dialysis for now. Monitor I/O's. Renal dose abx/meds per pharmacy.  GASTROINTESTINAL A:   Obesity N/V/D  Suspect Viral Gastroenteritis  Concern for cholecystitis, surgery seeing, will order HIDA scan today. P:   PRN Zofran. RUQ U/S pending. NPO until after HIDA scan today.  HEMATOLOGIC A:   Thrombocytopenia  P:  Trend CBC DVT: heparin sq  INFECTIOUS A:   Septic shock - GPC's. Favor cellulitis over  gastroenteritis or acute chole Nausea / Vomiting / Diarrhea Chest Wall Induration / R Breast / Axilla Pain  P:   BCx2 4/11 >> 1/2 GPC's in chains >>  C-Diff 4/11 >> neg Flu 4/11 >> neg  UA 4/10 >> few bacteria, 7-10 WBC  UC 4/11 >>   Vanco, start date 4/10, day 4/x Zosyn, start date 4/10, day  4/x  Repeat lactic acid reassuring  ENDOCRINE A:   Mild Hyperglycemia P:   Monitor glucose on BMP, if consistently > 180 add SSI   NEUROLOGIC A:  Acute encephalopathy - sleep talking Depression  P:   RASS goal: n/a Continue zoloft, per primary SVC  FAMILY  - Updates: Patient and son updated at bedside.   Transfer to SDU.  Transfer care back to [facility], PCCM will sign off, please call back if needed.  Rush Farmer, M.D. [facility] Pulmonary/Critical Care Medicine. Pager: [phone]. After hours pager: [phone].  01/26/2015, 10:10 AM

## 2015-01-26 NOTE — Progress Notes (Signed)
Talked with surgical pa, Megan about scan results. No interventions today and patient can be advanced to a clear liquid diet and take oral medications. Will monitor for nausea and vomiting. Patient does not complain of any of this right now. Will continue to monitor.  Sandre Kitty

## 2015-01-26 NOTE — Progress Notes (Addendum)
[facility]        Subjective:  Progressive redness and tenderness along her flank also with right shoulder pain now  Antibiotics:  Anti-infectives    Start     Dose/Rate Route Frequency Ordered Stop   01/26/15 1530  cefTRIAXone (ROCEPHIN) 2 g in dextrose 5 % 50 mL IVPB - Premix     2 g 100 mL/hr over 30 Minutes Intravenous Every 24 hours 01/26/15 1505     01/25/15 1200  vancomycin (VANCOCIN) 1,500 mg in sodium chloride 0.9 % 500 mL IVPB  Status:  Discontinued     1,500 mg 250 mL/hr over 120 Minutes Intravenous Every 48 hours 01/23/15 1417 01/25/15 1015   01/25/15 1100  clindamycin (CLEOCIN) IVPB 900 mg     900 mg 100 mL/hr over 30 Minutes Intravenous 3 times per day 01/25/15 1014     01/24/15 1600  doxycycline (VIBRAMYCIN) 100 mg in dextrose 5 % 250 mL IVPB  Status:  Discontinued     100 mg 125 mL/hr over 120 Minutes Intravenous Every 12 hours 01/24/15 1500 01/25/15 1013   01/24/15 1400  piperacillin-tazobactam (ZOSYN) IVPB 2.25 g  Status:  Discontinued     2.25 g 100 mL/hr over 30 Minutes Intravenous 4 times per day 01/24/15 1148 01/26/15 1505   01/24/15 1000  vancomycin (VANCOCIN) IVPB 1000 mg/200 mL premix  Status:  Discontinued     1,000 mg 200 mL/hr over 60 Minutes Intravenous Every 24 hours 01/23/15 0846 01/23/15 1417   01/23/15 2200  piperacillin-tazobactam (ZOSYN) IVPB 3.375 g  Status:  Discontinued     3.375 g 12.5 mL/hr over 240 Minutes Intravenous Every 8 hours 01/23/15 0846 01/24/15 1148   01/23/15 1000  oseltamivir (TAMIFLU) capsule 30 mg  Status:  Discontinued     30 mg Oral 2 times daily 01/23/15 0803 01/23/15 1413   01/23/15 0900  vancomycin (VANCOCIN) 2,000 mg in sodium chloride 0.9 % 500 mL IVPB     2,000 mg 250 mL/hr over 120 Minutes Intravenous  Once 01/23/15 0846 01/23/15 1401   01/23/15 0845  piperacillin-tazobactam (ZOSYN) IVPB 3.375 g     3.375 g 100 mL/hr over 30 Minutes Intravenous  Once 01/23/15 0846 01/23/15 2013        Medications: Scheduled Meds: . antiseptic oral rinse  7 mL Mouth Rinse q12n4p  . aspirin EC  325 mg Oral Daily  . cefTRIAXone (ROCEPHIN)  IV  2 g Intravenous Q24H  . chlorhexidine  15 mL Mouth Rinse BID  . clindamycin (CLEOCIN) IV  900 mg Intravenous 3 times per day  . estrogens (conjugated)  0.3 mg Oral Q48H  . heparin  5,000 Units Subcutaneous 3 times per day  . ipratropium-albuterol  3 mL Nebulization Q4H  . LORazepam  1 mg Oral Once  . potassium chloride  10 mEq Intravenous Q1 Hr x 2  . potassium chloride  40 mEq Oral Once  . sertraline  50 mg Oral Daily  . sodium chloride  3 mL Intravenous Q12H   Continuous Infusions: . diltiazem (CARDIZEM) infusion Stopped (01/24/15 2226)  . phenylephrine (NEO-SYNEPHRINE) Adult infusion Stopped (01/25/15 0900)  .  sodium bicarbonate  infusion 1000 mL 50 mL/hr at 01/26/15 1400   PRN Meds:.sodium chloride, acetaminophen **OR** acetaminophen, guaifenesin, levalbuterol, loperamide, ondansetron **OR** ondansetron (ZOFRAN) IV, sodium chloride    Objective: Weight change: 0 lb (0 kg)  Intake/Output Summary (Last 24 hours) at 01/26/15 1544 Last data filed at 01/26/15 1419  Gross  per 24 hour  Intake   1740 ml  Output   1000 ml  Net    740 ml   Blood pressure 140/80, pulse 131, temperature 97.6 F (36.4 C), temperature source Oral, resp. rate 21, height 5\' 6"  (1.676 m), weight 247 lb 9.2 oz (112.3 kg), SpO2 96 %. Temp:  [97.6 F (36.4 C)-98.4 F (36.9 C)] 97.6 F (36.4 C) (04/13 0425) Pulse Rate:  [25-131] 131 (04/13 1406) Resp:  [18-31] 21 (04/13 1406) BP: (95-142)/(49-118) 140/80 mmHg (04/13 1406) SpO2:  [69 %-100 %] 96 % (04/13 1406) Weight:  [247 lb 9.2 oz (112.3 kg)] 247 lb 9.2 oz (112.3 kg) (04/13 0600)  Physical Exam: General: Alert and awake, but was initially biting at her O2 probe HEENT: anicteric sclera, pupils reactive to light and accommodation, EOMI CVS tachy rate, irr, irr  no murmur rubs or gallops Chest: clear  to auscultation bilaterally, no wheezing, rales or rhonchi Abdomen: soft TTP in RUQ, no rebound  Lymph/skin new progressive almost bullous cellulitis extending down flank (which is NOT where she initially presented with pain   Left axilla, breast 01/24/15    01/26/15:    Flank 01/26/15:       Neuro: nonfocal  CBC:  CBC Latest Ref Rng 01/26/2015 01/25/2015 01/24/2015  WBC 4.0 - 10.5 K/uL 12.5(H) 12.2(H) 9.0  Hemoglobin 12.0 - 15.0 g/dL 10.5(L) 11.7(L) 11.6(L)  Hematocrit 36.0 - 46.0 % 28.6(L) 32.9(L) 32.4(L)  Platelets 150 - 400 K/uL 67(L) 80(L) 73(L)       BMET  Recent Labs  01/25/15 0500 01/26/15 0400  NA 125* 126*  K 3.8 3.0*  CL 97 94*  CO2 13* 15*  GLUCOSE 92 111*  BUN 66* 79*  CREATININE 4.06* 4.66*  CALCIUM 7.2* 7.4*     Liver Panel   Recent Labs  01/24/15 0500 01/25/15 0500  PROT 5.0* 5.0*  ALBUMIN 2.2* 2.0*  AST 54* 38*  ALT 39* 37*  ALKPHOS 38* 41  BILITOT 1.4* 1.2       Sedimentation Rate No results for input(s): ESRSEDRATE in the last 72 hours. C-Reactive Protein No results for input(s): CRP in the last 72 hours.  Micro Results: Recent Results (from the past 720 hour(s))  Culture, blood (routine x 2)     Status: None (Preliminary result)   Collection Time: 01/23/15 12:23 PM  Result Value Ref Range Status   Specimen Description BLOOD RIGHT HAND  Final   Special Requests BOTTLES DRAWN AEROBIC ONLY 4CC  Final   Culture   Final    GROUP A STREP (S.PYOGENES) ISOLATED Note: CRITICAL RESULT CALLED TO, READ BACK BY AND VERIFIED WITH: CHRISTINE HINSHAW @ 1245 ON [id number] BY [place] Note: Gram Stain Report Called to,Read Back By and Verified With: Sherri Rad RN on 01/24/15 at 06:55 by Rise Mu Performed at [facility]    Report Status PENDING  Incomplete  Culture, blood (routine x 2)     Status: None (Preliminary result)   Collection Time: 01/23/15 12:30 PM  Result Value Ref Range Status   Specimen Description BLOOD  LEFT HAND  Final   Special Requests BOTTLES DRAWN AEROBIC ONLY 5CC  Final   Culture   Final           BLOOD CULTURE RECEIVED NO GROWTH TO DATE CULTURE WILL BE HELD FOR 5 DAYS BEFORE ISSUING A FINAL NEGATIVE REPORT Performed at [facility]    Report Status PENDING  Incomplete  Clostridium Difficile by PCR  Status: None   Collection Time: 01/24/15 12:59 PM  Result Value Ref Range Status   C difficile by pcr NEGATIVE NEGATIVE Final  Culture, Urine     Status: None   Collection Time: 01/24/15  2:02 PM  Result Value Ref Range Status   Specimen Description URINE, CATHETERIZED  Final   Special Requests NONE  Final   Colony Count NO GROWTH Performed at [facility]   Final   Culture NO GROWTH Performed at [facility]   Final   Report Status 01/26/2015 FINAL  Final  MRSA PCR Screening     Status: None   Collection Time: 01/24/15  4:30 PM  Result Value Ref Range Status   MRSA by PCR NEGATIVE NEGATIVE Final    Comment:        The GeneXpert MRSA Assay (FDA approved for NASAL specimens only), is one component of a comprehensive MRSA colonization surveillance program. It is not intended to diagnose MRSA infection nor to guide or monitor treatment for MRSA infections.     Studies/Results: Ct Abdomen Pelvis Wo Contrast  01/24/2015   CLINICAL DATA:  Abdominal pain, nausea, vomiting, diarrhea. Fever and chills. Sepsis.  EXAM: CT ABDOMEN AND PELVIS WITHOUT CONTRAST  TECHNIQUE: Multidetector CT imaging of the abdomen and pelvis was performed following the standard protocol without IV contrast.  COMPARISON:  No similar prior exam is available at this institution for comparison or on [facility].  FINDINGS: Lower chest: Small bilateral pleural effusions are noted. Interlobular septal thickening and bronchial wall thickening are suggestive of interstitial pulmonary edema.  Hepatobiliary: Hepatic hypodensity suggests steatosis. No focal hepatic abnormality or  intrahepatic ductal dilatation. Gallbladder distention with probable dependent stone image 34.  Pancreas: Normal  Spleen: Normal  Adrenals/Urinary Tract: Adrenal glands appear normal. Moderate nonspecific bilateral perinephric stranding is identified. No hydroureteronephrosis. A Foley catheter is in place and the bladder is decompressed. No radiopaque renal or ureteral calculus. Renal parenchymal detail is obscured by lack of contrast.  Stomach/Bowel: Stomach appears normal. Normal appendix. No bowel wall thickening or focal segmental dilatation is identified.  Vascular/Lymphatic: No lymphadenopathy. Mild atheromatous aortic calcification identified.  Reproductive: Uterus and ovaries are unremarkable.  Other: Mild soft tissue anasarca is present which may suggest fluid overload. No free air or free intra-abdominal fluid.  Musculoskeletal: No acute osseous abnormality. L5-S1 disc degenerative change.  IMPRESSION: Findings at the lung bases suggest interstitial pulmonary edema with mild soft tissue anasarca.  Nonspecific bilateral perinephric stranding without hydroureteronephrosis or radiopaque renal or ureteral calculus.  Gallbladder distention with possible internal gallstone identified. This again raises the question of possible cholecystitis in the setting of sepsis.   Electronically Signed   By: Conchita Paris M.D.   On: 01/24/2015 21:04   Nm Hepatobiliary Including Gb  01/26/2015   CLINICAL DATA:  Gallstones.  Distended gallbladder on prior CT.  EXAM: NUCLEAR MEDICINE HEPATOBILIARY IMAGING  TECHNIQUE: Sequential images of the abdomen were obtained out to 60 minutes following intravenous administration of radiopharmaceutical.  RADIOPHARMACEUTICALS:  5.0 Millicurie [id number] Choletec  COMPARISON:  CT 01/24/2015  FINDINGS: Prompt uptake and excretion of radiotracer by the liver. Activity is seen in small bowel by 30 minutes. No activity noted in the gallbladder at 90 minutes. Therefore, 4 mg of morphine was  administered intravenously. Continued imaging demonstrates filling of the gallbladder post morphine.  IMPRESSION: Delayed filling of the gallbladder, but the gallbladder fills after morphine administration compatible with patent cystic duct. No evidence of biliary obstruction.  Electronically Signed   By: Rolm Baptise M.D.   On: 01/26/2015 14:28   [place] Chest  01/25/2015   CLINICAL DATA:  69 year old female with right axillary and chest wall pain. Fever and sepsis. Initial encounter.  EXAM: CHEST ULTRASOUND  COMPARISON:  Chest CT 01/23/2015.  FINDINGS: Ultrasound imaging in the area of clinical concern corresponding to the right axilla and chest wall.  Lymph nodes with normal fatty hila. Diffuse subcutaneous edema. Evidence of edematous chest wall muscle (image 19) with separation of muscle fibers, but no discrete or drainable fluid collection.  IMPRESSION: Subcutaneous and chest wall muscle edema with no discrete or drainable fluid collection. Regional lymph nodes remain normal.   Electronically Signed   By: Genevie Ann M.D.   On: 01/25/2015 12:11   Dg Chest Port 1 View  01/24/2015   CLINICAL DATA:  Wheezing and shortness of breath.  EXAM: PORTABLE CHEST - 1 VIEW  COMPARISON:  01/24/2015  FINDINGS: Left-sided PICC line tip overlies the superior vena cava. Heart is mildly prominent. There Kerley B-lines consistent with pulmonary edema. No focal consolidation. No overt alveolar edema.  IMPRESSION: 1. Mild interstitial edema. 2. Left-sided PICC line tip to the superior vena cava.   Electronically Signed   By: Nolon Nations M.D.   On: 01/24/2015 22:14   US Abdomen Limited Ruq  01/25/2015   CLINICAL DATA:  Upper abdominal pain  EXAM: US ABDOMEN LIMITED - RIGHT UPPER QUADRANT  COMPARISON:  None.  FINDINGS: Gallbladder:  There is a a focal gallstone measuring 1.9 cm in length which moves and shadows. Gallbladder wall is not thickened, and there is no pericholecystic fluid. No sonographic Murphy sign noted.  Common  bile duct:  Diameter: 5 mm. No intrahepatic or extrahepatic biliary duct dilatation.  Liver:  No focal lesion identified. Liver echogenicity is diffusely increased.  There is a small right pleural effusion.  IMPRESSION: Cholelithiasis. Increased liver echogenicity, most likely due to hepatic steatosis. While no focal liver lesions are identified, it must be cautioned that the sensitivity of ultrasound for focal liver lesions is diminished in this circumstance. Small right pleural effusion noted.   Electronically Signed   By: Lowella Grip III M.D.   On: 01/25/2015 12:15      Assessment/Plan:  Principal Problem:   Atrial fibrillation with RVR Active Problems:   Palpitations   Upper respiratory disease   Dehydration   Breast pain, right   A-fib   Fever   Nausea & vomiting   Sepsis   Acute renal insufficiency   Abdominal pain   Axillary pain   Dyspnea   Hypoxia   Thrombocytopenia   Transaminitis   Hyperbilirubinemia   FUO (fever of unknown origin)   Septic shock   AKI (acute kidney injury)   Cholecystitis   DIC (disseminated intravascular coagulation)   Group A streptococcal infection   Cellulitis    Lichelle Viets is a 69 y.o. female with fevers, axillary LN pain, breast pain ARF, TTpenia, yesterday with apparent clinical worsening on vancomycin and zosyn with worsenign renal fxn, tachypnea, TTPenia --> ICU. She has coag profile c/w more chronic DIC. NOW after visiting her she is found to have GROUP A strep in one of TWO blood cultures   #1 Severe Sepsis with shock due to GAS  Narrow antibiotics to ceftriaxone once daily for a bactericidal beta-lactam for group A strep Continue clindamycin for inhibition of ribosomal production of toxins  Monitor soft tissue for need for I and D by  Surgery, no crepitus on exam today, BP much improved  #2 Shoulder pain  --if persists will need MRI right shoulder to make sure she does not have a septic right shoulder  #3  Gallstones: sp HIDA, biliary system does not seen to be the nidus of her infection now that she is had clear-cut declaration of her group A strep bacteremia and cellulitis soft tissue infection   #3 thrombocytopenia: Likely from DIC. Doubt beta-lactam induced thrombocytopenia could be hit potentially possibly.  #4 renal failure multifactorial with sepsis NSAIDs and vancomycin the role of the picture  #5 IP: I placed the patient on contact precautions. Note there is no specific contact precaution policy per impact infection prevention. However after discussions with infection prevention as well as Dr. Baxter Flattery consultant to infection prevention from infectious diseases I feel it is prudent to place the patient on contact precautions for now in particular given the fact that this is an aggressive infection and that the lesions that are evolving in her area of bullous cellulitis more likely to become open and more likely to become contagious to those who do not use barrier protection (gloves, gown)  I spent greater than 40 minutes with the patient including greater than 50% of time in face to face counsel of the patient concerning her sepsis and potential sources and in coordination of their care with [place]     LOS: 3 days   Alcide Evener 01/26/2015, 3:44 PM

## 2015-01-26 NOTE — Progress Notes (Signed)
Patient Name: Amanda Wilkins Date of Encounter: 01/26/2015     Principal Problem:   Atrial fibrillation with RVR Active Problems:   Palpitations   Upper respiratory disease   Dehydration   Breast pain, right   A-fib   Fever   Nausea & vomiting   Sepsis   Acute renal insufficiency   Abdominal pain   Axillary pain   Dyspnea   Hypoxia   Thrombocytopenia   Transaminitis   Hyperbilirubinemia   FUO (fever of unknown origin)   Septic shock   AKI (acute kidney injury)   Cholecystitis   DIC (disseminated intravascular coagulation)   Group A streptococcal infection   Cellulitis    SUBJECTIVE  No chest pain or significant dyspnea. Remains in atrial fibrillation with heart rate 100-120. Tolerating well. GI workup in progress. CURRENT MEDS . antiseptic oral rinse  7 mL Mouth Rinse q12n4p  . aspirin EC  325 mg Oral Daily  . chlorhexidine  15 mL Mouth Rinse BID  . clindamycin (CLEOCIN) IV  900 mg Intravenous 3 times per day  . estrogens (conjugated)  0.3 mg Oral Q48H  . heparin  5,000 Units Subcutaneous 3 times per day  . ipratropium-albuterol  3 mL Nebulization Q4H  . LORazepam  1 mg Oral Once  . piperacillin-tazobactam (ZOSYN)  IV  2.25 g Intravenous 4 times per day  . potassium chloride  40 mEq Oral Once  . sertraline  50 mg Oral Daily  . sodium chloride  3 mL Intravenous Q12H    OBJECTIVE  Filed Vitals:   01/26/15 0809 01/26/15 0900 01/26/15 1000 01/26/15 1100  BP:  122/70 109/57 136/111  Pulse:  119 121 118  Temp:      TempSrc:      Resp:  22 18 21   Height:      Weight:      SpO2: 98% 100% 100% 98%    Intake/Output Summary (Last 24 hours) at 01/26/15 1416 Last data filed at 01/26/15 1100  Gross per 24 hour  Intake   1240 ml  Output   1000 ml  Net    240 ml   Filed Weights   01/23/15 0400 01/25/15 0500 01/26/15 0600  Weight: 232 lb 12.8 oz (105.597 kg) 247 lb 9.2 oz (112.3 kg) 247 lb 9.2 oz (112.3 kg)    PHYSICAL EXAM  General: Pleasant,  NAD. Neuro: Alert and oriented X 3. Moves all extremities spontaneously. Psych: Normal affect. HEENT: Normal Neck: Supple without bruits or JVD. Lungs: Resp regular and unlabored, CTA. Heart: Irregularly irregular no s3, s4, or murmurs. Abdomen: Soft, non-tender, non-distended, BS + x 4. There is a bright erythematous rash along the right lower lateral abdomen and right flank Extremities: No clubbing, cyanosis or edema.   Accessory Clinical Findings  CBC  Recent Labs  01/24/15 2043 01/25/15 0500 01/26/15 0400  WBC 9.0 12.2* 12.5*  NEUTROABS 8.0*  --   --   HGB 11.6* 11.7* 10.5*  HCT 32.4* 32.9* 28.6*  MCV 94.5 94.0 92.9  PLT 73*  74* 80* 67*   Basic Metabolic Panel  Recent Labs  01/25/15 0500 01/26/15 0400  NA 125* 126*  K 3.8 3.0*  CL 97 94*  CO2 13* 15*  GLUCOSE 92 111*  BUN 66* 79*  CREATININE 4.06* 4.66*  CALCIUM 7.2* 7.4*  MG  --  2.4  PHOS  --  5.6*   Liver Function Tests  Recent Labs  01/24/15 0500 01/25/15 0500  AST 54* 38*  ALT 39* 37*  ALKPHOS 38* 41  BILITOT 1.4* 1.2  PROT 5.0* 5.0*  ALBUMIN 2.2* 2.0*   No results for input(s): LIPASE, AMYLASE in the last 72 hours. Cardiac Enzymes No results for input(s): CKTOTAL, CKMB, CKMBINDEX, TROPONINI in the last 72 hours. BNP Invalid input(s): POCBNP D-Dimer  Recent Labs  01/24/15 2043  DDIMER 8.22*   Hemoglobin A1C No results for input(s): HGBA1C in the last 72 hours. Fasting Lipid Panel No results for input(s): CHOL, HDL, LDLCALC, TRIG, CHOLHDL, LDLDIRECT in the last 72 hours. Thyroid Function Tests No results for input(s): TSH, T4TOTAL, T3FREE, THYROIDAB in the last 72 hours.  Invalid input(s): FREET3  TELE  Atrial fibrillation with rapid VR  ECG    Radiology/Studies  Ct Abdomen Pelvis Wo Contrast  01/24/2015   CLINICAL DATA:  Abdominal pain, nausea, vomiting, diarrhea. Fever and chills. Sepsis.  EXAM: CT ABDOMEN AND PELVIS WITHOUT CONTRAST  TECHNIQUE:  Multidetector CT imaging of the abdomen and pelvis was performed following the standard protocol without IV contrast.  COMPARISON:  No similar prior exam is available at this institution for comparison or on [facility].  FINDINGS: Lower chest: Small bilateral pleural effusions are noted. Interlobular septal thickening and bronchial wall thickening are suggestive of interstitial pulmonary edema.  Hepatobiliary: Hepatic hypodensity suggests steatosis. No focal hepatic abnormality or intrahepatic ductal dilatation. Gallbladder distention with probable dependent stone image 34.  Pancreas: Normal  Spleen: Normal  Adrenals/Urinary Tract: Adrenal glands appear normal. Moderate nonspecific bilateral perinephric stranding is identified. No hydroureteronephrosis. A Foley catheter is in place and the bladder is decompressed. No radiopaque renal or ureteral calculus. Renal parenchymal detail is obscured by lack of contrast.  Stomach/Bowel: Stomach appears normal. Normal appendix. No bowel wall thickening or focal segmental dilatation is identified.  Vascular/Lymphatic: No lymphadenopathy. Mild atheromatous aortic calcification identified.  Reproductive: Uterus and ovaries are unremarkable.  Other: Mild soft tissue anasarca is present which may suggest fluid overload. No free air or free intra-abdominal fluid.  Musculoskeletal: No acute osseous abnormality. L5-S1 disc degenerative change.  IMPRESSION: Findings at the lung bases suggest interstitial pulmonary edema with mild soft tissue anasarca.  Nonspecific bilateral perinephric stranding without hydroureteronephrosis or radiopaque renal or ureteral calculus.  Gallbladder distention with possible internal gallstone identified. This again raises the question of possible cholecystitis in the setting of sepsis.   Electronically Signed   By: Conchita Paris M.D.   On: 01/24/2015 21:04   Dg Chest 2 View  01/22/2015   CLINICAL DATA:  Atrial fibrillation. Shortness of breath with  exertion. Right axillary pain. No injury.  EXAM: CHEST  2 VIEW  COMPARISON:  09/22/2014  FINDINGS: Normal heart size and pulmonary vascularity. Perihilar interstitial changes suggesting chronic bronchitis. Linear atelectasis or scarring in the lung bases. No focal consolidation. No blunting of costophrenic angles. No pneumothorax. Mediastinal contours appear intact. Calcified aorta. Mild degenerative changes in the spine.  IMPRESSION: Chronic bronchitic changes in the lungs with slight fibrosis or linear atelectasis in the lung bases. No focal consolidation.   Electronically Signed   By: Lucienne Capers M.D.   On: 01/22/2015 23:46   Ct Chest Wo Contrast  01/23/2015   CLINICAL DATA:  69 year old female with right-sided chest and axillary pain. Sepsis. Fever for 1-2 weeks.  EXAM: CT CHEST WITHOUT CONTRAST  TECHNIQUE: Multidetector CT imaging of the chest was performed following the standard protocol without IV contrast.  COMPARISON:  01/22/2015 chest radiograph. Chest CT report dated 09/22/2014. Images are not available at this  time due to PACS downtime.  FINDINGS: Mediastinum/Nodes: The heart and great vessels are unremarkable. There is no evidence of thoracic aortic aneurysm. No pleural or pericardial effusions are identified. No enlarged lymph nodes are noted.  Lungs/Pleura: Mild dependent and basilar atelectasis noted. There is no evidence of airspace disease, consolidation, suspicious nodule, mass or endobronchial/endotracheal lesion.  Upper abdomen: Hepatic steatosis identified.  Musculoskeletal: Right axillary inflammation is identified extending inferiorly along the right chest. There is no evidence of gas or abscess. No acute or suspicious bony abnormalities are identified.  IMPRESSION: Inflammation in the right axilla extending inferiorly along the right chest -suspicious for infection given clinical history. No evidence of focal abscess or gas.  Mild dependent/basilar atelectasis.   Electronically  Signed   By: Margarette Canada M.D.   On: 01/23/2015 11:12   [place] Chest  01/25/2015   CLINICAL DATA:  69 year old female with right axillary and chest wall pain. Fever and sepsis. Initial encounter.  EXAM: CHEST ULTRASOUND  COMPARISON:  Chest CT 01/23/2015.  FINDINGS: Ultrasound imaging in the area of clinical concern corresponding to the right axilla and chest wall.  Lymph nodes with normal fatty hila. Diffuse subcutaneous edema. Evidence of edematous chest wall muscle (image 19) with separation of muscle fibers, but no discrete or drainable fluid collection.  IMPRESSION: Subcutaneous and chest wall muscle edema with no discrete or drainable fluid collection. Regional lymph nodes remain normal.   Electronically Signed   By: Genevie Ann M.D.   On: 01/25/2015 12:11   US Renal  01/24/2015   CLINICAL DATA:  Acute renal insufficiency.  EXAM: RENAL/URINARY TRACT ULTRASOUND COMPLETE  COMPARISON:  11/16/2009  FINDINGS: Right Kidney:  Length: 10.1 cm. Echogenicity within normal limits. No mass or hydronephrosis visualized.  Left Kidney:  Length: 10.4 cm. No hydronephrosis. Mild perinephric edema or trace fluid adjacent the lower pole left kidney.  Bladder:  Collapsed around a Foley catheter.  Exam degraded by patient body habitus. Incidental note is made of a 2.6 cm gallstone and a left-sided pleural effusion.  IMPRESSION: 1.  No hydronephrosis.  No explanation for renal insufficiency. 2. Decreased sensitivity and specificity exam due to technique related factors, as described above. 3. Cholelithiasis. 4. Small left pleural effusion.   Electronically Signed   By: Abigail Miyamoto M.D.   On: 01/24/2015 10:56   Dg Chest Port 1 View  01/24/2015   CLINICAL DATA:  Wheezing and shortness of breath.  EXAM: PORTABLE CHEST - 1 VIEW  COMPARISON:  01/24/2015  FINDINGS: Left-sided PICC line tip overlies the superior vena cava. Heart is mildly prominent. There Kerley B-lines consistent with pulmonary edema. No focal consolidation. No overt  alveolar edema.  IMPRESSION: 1. Mild interstitial edema. 2. Left-sided PICC line tip to the superior vena cava.   Electronically Signed   By: Nolon Nations M.D.   On: 01/24/2015 22:14   Dg Chest Port 1 View  01/24/2015   CLINICAL DATA:  Hypoxia, pt was admitted with an acute URI and right armpit infection and was found to be in a-fib with RVR, hypovolemic, hyponatremic with an acute renal failure.  EXAM: PORTABLE CHEST - 1 VIEW  COMPARISON:  Radiograph 01/23/2015  FINDINGS: Left-sided PICC line with tip in distal SVC. Stable cardiac silhouette. There is mild central venous congestion. No focal infiltrate. No pulmonary edema. Mild basilar atelectasis.  IMPRESSION: Mild basilar atelectasis and minimal central venous congestion. No change from prior.   Electronically Signed   By: Helane Gunther.D.  On: 01/24/2015 09:44   Dg Chest Port 1 View  01/23/2015   CLINICAL DATA:  Shortness of breath.  EXAM: PORTABLE CHEST - 1 VIEW  COMPARISON:  Same day.  FINDINGS: The heart size and mediastinal contours are within normal limits. Both lungs are clear. No pneumothorax or pleural effusion is noted. Left-sided PICC line is noted with tip in expected position of cavoatrial junction. The visualized skeletal structures are unremarkable.  IMPRESSION: No acute cardiopulmonary abnormality seen.   Electronically Signed   By: Marijo Conception, M.D.   On: 01/23/2015 18:52   Dg Chest Port 1 View  01/23/2015   CLINICAL DATA:  PICC line placement  EXAM: PORTABLE CHEST - 1 VIEW  COMPARISON:  CT scan of the chest same day  FINDINGS: Cardiomediastinal silhouette is unremarkable. No acute infiltrate or pulmonary edema. There is a left arm PICC line with tip in right atrium. For distal SVC position the PICC line should be retracted about 1 cm. No pneumothorax.  IMPRESSION: Left arm PICC line with tip in right atrium. For distal SVC position PICC line should be retracted about 1 cm. No pneumothorax.   Electronically Signed   By:  Lahoma Crocker M.D.   On: 01/23/2015 17:45   US Abdomen Limited Ruq  01/25/2015   CLINICAL DATA:  Upper abdominal pain  EXAM: US ABDOMEN LIMITED - RIGHT UPPER QUADRANT  COMPARISON:  None.  FINDINGS: Gallbladder:  There is a a focal gallstone measuring 1.9 cm in length which moves and shadows. Gallbladder wall is not thickened, and there is no pericholecystic fluid. No sonographic Murphy sign noted.  Common bile duct:  Diameter: 5 mm. No intrahepatic or extrahepatic biliary duct dilatation.  Liver:  No focal lesion identified. Liver echogenicity is diffusely increased.  There is a small right pleural effusion.  IMPRESSION: Cholelithiasis. Increased liver echogenicity, most likely due to hepatic steatosis. While no focal liver lesions are identified, it must be cautioned that the sensitivity of ultrasound for focal liver lesions is diminished in this circumstance. Small right pleural effusion noted.   Electronically Signed   By: Lowella Grip III M.D.   On: 01/25/2015 12:15    ASSESSMENT AND PLAN  1. Paroxysmal atrial fibrillation.Currently off IV diltiazem. Can consider adding low dose oral diltiazem once we know whether she will be going to surgery soon or not. Echocardiogram  shows good left ventricular systolic function with ejection fraction 55-60%. There were no wall motion abnormalities. There is mild mitral regurgitation and mild left atrial enlargement. 2. Cholelithiasis. If she requires surgery, I feel that she would not be a prohibitive risk. 3. Thrombocytopenia 4. Possible cellulitis or erysipelas of abdominal right lateral wall.  Signed, Darlin Coco MD

## 2015-01-26 NOTE — Progress Notes (Signed)
UR Completed.  [phone]  

## 2015-01-27 ENCOUNTER — Inpatient Hospital Stay (HOSPITAL_COMMUNITY): Payer: Medicare Other

## 2015-01-27 ENCOUNTER — Encounter: Payer: Medicare Other | Admitting: Gynecology

## 2015-01-27 DIAGNOSIS — R6521 Severe sepsis with septic shock: Secondary | ICD-10-CM

## 2015-01-27 DIAGNOSIS — N19 Unspecified kidney failure: Secondary | ICD-10-CM

## 2015-01-27 DIAGNOSIS — J9690 Respiratory failure, unspecified, unspecified whether with hypoxia or hypercapnia: Secondary | ICD-10-CM

## 2015-01-27 DIAGNOSIS — B009 Herpesviral infection, unspecified: Secondary | ICD-10-CM

## 2015-01-27 DIAGNOSIS — K8020 Calculus of gallbladder without cholecystitis without obstruction: Secondary | ICD-10-CM

## 2015-01-27 DIAGNOSIS — A419 Sepsis, unspecified organism: Secondary | ICD-10-CM | POA: Diagnosis present

## 2015-01-27 LAB — BASIC METABOLIC PANEL
ANION GAP: 15 (ref 5–15)
BUN: 83 mg/dL — ABNORMAL HIGH (ref 6–23)
CO2: 19 mmol/L (ref 19–32)
Calcium: 7.8 mg/dL — ABNORMAL LOW (ref 8.4–10.5)
Chloride: 94 mmol/L — ABNORMAL LOW (ref 96–112)
Creatinine, Ser: 5 mg/dL — ABNORMAL HIGH (ref 0.50–1.10)
GFR calc Af Amer: 9 mL/min — ABNORMAL LOW (ref >90)
GFR calc non Af Amer: 8 mL/min — ABNORMAL LOW (ref >90)
Glucose, Bld: 123 mg/dL — ABNORMAL HIGH (ref 70–99)
Potassium: 3.6 mmol/L (ref 3.5–5.1)
SODIUM: 128 mmol/L — AB (ref 135–145)

## 2015-01-27 LAB — PHOSPHORUS: Phosphorus: 5.3 mg/dL — ABNORMAL HIGH (ref 2.3–4.6)

## 2015-01-27 LAB — CBC
HEMATOCRIT: 26.8 % — AB (ref 36.0–46.0)
Hemoglobin: 9.9 g/dL — ABNORMAL LOW (ref 12.0–15.0)
MCH: 33.6 pg (ref 26.0–34.0)
MCHC: 36.9 g/dL — ABNORMAL HIGH (ref 30.0–36.0)
MCV: 90.8 fL (ref 78.0–100.0)
Platelets: 81 10*3/uL — ABNORMAL LOW (ref 150–400)
RBC: 2.95 MIL/uL — AB (ref 3.87–5.11)
RDW: 13.6 % (ref 11.5–15.5)
WBC: 14 10*3/uL — ABNORMAL HIGH (ref 4.0–10.5)

## 2015-01-27 LAB — MAGNESIUM: MAGNESIUM: 2.6 mg/dL — AB (ref 1.5–2.5)

## 2015-01-27 LAB — PATHOLOGIST SMEAR REVIEW

## 2015-01-27 MED ORDER — DILTIAZEM HCL 100 MG IV SOLR
5.0000 mg/h | INTRAVENOUS | Status: AC
  Administered 2015-01-27: 10 mg/h via INTRAVENOUS
  Administered 2015-01-27: 5 mg/h via INTRAVENOUS
  Administered 2015-01-28 – 2015-01-31 (×8): 10 mg/h via INTRAVENOUS
  Filled 2015-01-27 (×10): qty 100

## 2015-01-27 MED ORDER — PANTOPRAZOLE SODIUM 40 MG PO TBEC
40.0000 mg | DELAYED_RELEASE_TABLET | Freq: Two times a day (BID) | ORAL | Status: DC
  Administered 2015-01-27: 40 mg via ORAL
  Filled 2015-01-27: qty 1

## 2015-01-27 MED ORDER — HYDROCOD POLST-CPM POLST ER 10-8 MG/5ML PO SUER
5.0000 mL | Freq: Two times a day (BID) | ORAL | Status: DC
  Administered 2015-01-27 – 2015-02-09 (×25): 5 mL via ORAL
  Filled 2015-01-27 (×10): qty 5

## 2015-01-27 MED ORDER — IPRATROPIUM-ALBUTEROL 0.5-2.5 (3) MG/3ML IN SOLN
3.0000 mL | Freq: Four times a day (QID) | RESPIRATORY_TRACT | Status: DC
  Administered 2015-01-27 – 2015-01-29 (×7): 3 mL via RESPIRATORY_TRACT
  Filled 2015-01-27 (×8): qty 3

## 2015-01-27 MED ORDER — HYDROCOD POLST-CHLORPHEN POLST 10-8 MG/5ML PO LQCR
5.0000 mL | Freq: Two times a day (BID) | ORAL | Status: DC
  Administered 2015-01-27: 5 mL via ORAL
  Filled 2015-01-27 (×18): qty 5

## 2015-01-27 MED ORDER — HYDROCODONE-HOMATROPINE 5-1.5 MG/5ML PO SYRP
5.0000 mL | ORAL_SOLUTION | Freq: Four times a day (QID) | ORAL | Status: DC | PRN
  Administered 2015-01-27 (×2): 5 mL via ORAL
  Filled 2015-01-27 (×2): qty 5

## 2015-01-27 NOTE — Progress Notes (Signed)
[facility] Progress Note Patient Name: Amanda Wilkins DOB: December 09, 1945 MRN: [id number]   Date of Service  01/27/2015  HPI/Events of Note  Persistent cough despite robitussin, interfering with sleep.  eICU Interventions  Plan: Codeine based cough suppressant q6 hours prn        DETERDING,ELIZABETH 01/27/2015, 12:06 AM

## 2015-01-27 NOTE — Progress Notes (Signed)
Dr. Tommy Medal made aware of small white bumps on her tongue and says this will clear with time. No need to worry about airway. Will continue to monitor.  Amanda Wilkins

## 2015-01-27 NOTE — Evaluation (Signed)
Physical Therapy Evaluation Patient Details Name: Amanda Wilkins MRN: [id number] DOB: 07-17-1946 Today's Date: 01/27/2015   History of Present Illness  Adm 01/22/15 with n/v, general malaise, diarrhea, fever, and Rt flank pain (which turned into Group A Strep infection); sepsis with hypotension, afib with RVR and Cardizem drip (recently incr rate due to continued tachycardia) PMHx-afib, OA, obesity    Clinical Impression  Pt admitted with above diagnosis. Pt remains very ill, in ICU at time of eval. Pt's activity currently limited by atrial fibrillation and HR incr 104 to 147 with bed level activity. Pt currently with functional limitations due to the deficits listed below (see PT Problem List).  Anticipate pt will benefit from skilled PT to increase their independence and safety with mobility and will continue to assess d/c needs as she tolerates further activity/assessment.    Follow Up Recommendations  (TBD when tolerates further activity)    Equipment Recommendations   (TBD)    Recommendations for Other Services       Precautions / Restrictions        Mobility  Bed Mobility               General bed mobility comments: deferred due to incr HR with LE exercises in supine  Transfers                 General transfer comment: deferred due to incr HR with LE exercises in supine  Ambulation/Gait             General Gait Details: deferred due to incr HR with LE exercises in supine  Stairs            Wheelchair Mobility    Modified Rankin (Stroke Patients Only)       Balance                                             Pertinent Vitals/Pain Pain Assessment: No/denies pain    Home Living Family/patient expects to be discharged to:: Private residence Living Arrangements: Spouse/significant other Available Help at Discharge: Family Type of Home: House Home Access: Stairs to enter Entrance Stairs-Rails: Right Entrance  Stairs-Number of Steps: 4 Home Layout: One level Home Equipment: None      Prior Function Level of Independence: Independent         Comments: does not work outside home     Higher education careers adviser        Extremity/Trunk Assessment   Upper Extremity Assessment: Overall WFL for tasks assessed           Lower Extremity Assessment: Generalized weakness;RLE deficits/detail;LLE deficits/detail RLE Deficits / Details: Knee flexion 3-/5 (to 50*); knee extension 3/5 LLE Deficits / Details: Knee flexion 3-/5 (to 50*); knee extension 3/5     Communication   Communication: No difficulties  Cognition Arousal/Alertness: Lethargic (easily aroused, yet drifts off to sleep) Behavior During Therapy: WFL for tasks assessed/performed Overall Cognitive Status: Within Functional Limits for tasks assessed                      General Comments      Exercises General Exercises - Lower Extremity Ankle Circles/Pumps: AROM;AAROM;Both;10 reps;Supine Heel Slides: AROM;Both;5 reps;Supine Straight Leg Raises:  (educated via handout; not performed due to incr HR)      Assessment/Plan    PT Assessment Patient needs continued PT services  PT Diagnosis Generalized weakness   PT Problem List Decreased strength;Decreased range of motion;Decreased activity tolerance;Decreased mobility;Decreased knowledge of use of DME;Cardiopulmonary status limiting activity;Obesity  PT Treatment Interventions DME instruction;Gait training;Functional mobility training;Therapeutic activities;Therapeutic exercise;Balance training;Patient/family education   PT Goals (Current goals can be found in the Care Plan section) Acute Rehab PT Goals Patient Stated Goal: regain strength and independence PT Goal Formulation: With patient Time For Goal Achievement: 02/03/15 Potential to Achieve Goals: Fair    Frequency Min 3X/week   Barriers to discharge        Co-evaluation               End of Session    Activity Tolerance: Treatment limited secondary to medical complications (Comment) (HR elevates to 147 with minimal activity) Patient left: in bed;with call bell/phone within reach Nurse Communication:  (HR response to activity and deferred mobility)         Time: 5784-6962 PT Time Calculation (min) (ACUTE ONLY): 12 min   Charges:   PT Evaluation $Initial PT Evaluation Tier I: 1 Procedure     PT G Codes:        Mallorey Odonell 02/14/15, 3:37 PM Pager [phone]

## 2015-01-27 NOTE — Progress Notes (Addendum)
Patient Name: Amanda Wilkins Date of Encounter: 01/27/2015   SUBJECTIVE:  Up in chair, she is persistently coughing  CURRENT MEDS . antiseptic oral rinse  7 mL Mouth Rinse q12n4p  . aspirin EC  325 mg Oral Daily  . cefTRIAXone (ROCEPHIN)  IV  2 g Intravenous Q24H  . chlorhexidine  15 mL Mouth Rinse BID  . clindamycin (CLEOCIN) IV  900 mg Intravenous 3 times per day  . estrogens (conjugated)  0.3 mg Oral Q48H  . heparin  5,000 Units Subcutaneous 3 times per day  . ipratropium-albuterol  3 mL Nebulization Q6H  . LORazepam  1 mg Oral Once  . sertraline  50 mg Oral Daily  . sodium chloride  3 mL Intravenous Q12H    OBJECTIVE  Filed Vitals:   01/27/15 0826 01/27/15 0836 01/27/15 0900 01/27/15 1000  BP:   134/61 129/73  Pulse:   103 114  Temp: 97.3 F (36.3 C)     TempSrc: Oral     Resp:   21 21  Height:      Weight:      SpO2:  97% 92% 95%    Intake/Output Summary (Last 24 hours) at 01/27/15 1135 Last data filed at 01/27/15 1000  Gross per 24 hour  Intake   3260 ml  Output    680 ml  Net   2580 ml   Filed Weights   01/25/15 0500 01/26/15 0600 01/27/15 0600  Weight: 247 lb 9.2 oz (112.3 kg) 247 lb 9.2 oz (112.3 kg) 252 lb 10.4 oz (114.6 kg)    PHYSICAL EXAM: Gen: Obese female, mild distress- coughing Chest: decreased breath sounds, no wheezing Cardiac: Irreg Irreg rhythm Extrem: 1+ edema  Accessory Clinical Findings  CBC  Recent Labs  01/24/15 2043  01/26/15 0400 01/27/15 0420  WBC 9.0  < > 12.5* 14.0*  NEUTROABS 8.0*  --   --   --   HGB 11.6*  < > 10.5* 9.9*  HCT 32.4*  < > 28.6* 26.8*  MCV 94.5  < > 92.9 90.8  PLT 73*  74*  < > 67* 81*  < > = values in this interval not displayed. Basic Metabolic Panel  Recent Labs  01/26/15 0400 01/27/15 0420  NA 126* 128*  K 3.0* 3.6  CL 94* 94*  CO2 15* 19  GLUCOSE 111* 123*  BUN 79* 83*  CREATININE 4.66* 5.00*  CALCIUM 7.4* 7.8*  MG 2.4 2.6*  PHOS 5.6* 5.3*   Liver Function  Tests  Recent Labs  01/25/15 0500  AST 38*  ALT 37*  ALKPHOS 41  BILITOT 1.2  PROT 5.0*  ALBUMIN 2.0*   No results for input(s): LIPASE, AMYLASE in the last 72 hours. Cardiac Enzymes No results for input(s): CKTOTAL, CKMB, CKMBINDEX, TROPONINI in the last 72 hours. BNP Invalid input(s): POCBNP D-Dimer  Recent Labs  01/24/15 2043  DDIMER 8.22*   Hemoglobin A1C No results for input(s): HGBA1C in the last 72 hours. Fasting Lipid Panel No results for input(s): CHOL, HDL, LDLCALC, TRIG, CHOLHDL, LDLDIRECT in the last 72 hours. Thyroid Function Tests No results for input(s): TSH, T4TOTAL, T3FREE, THYROIDAB in the last 72 hours.  Invalid input(s): FREET3  TELE Atrial fibrillation with rapid VR  Echo: 01/25/15 Study Conclusions  - Left ventricle: The cavity size was normal. Wall thickness was normal. Systolic function was normal. The estimated ejection fraction was in the range of 55% to 60%. Wall motion was normal; there were no regional wall motion  abnormalities. - Mitral valve: Calcified annulus. There was mild regurgitation. - Left atrium: The atrium was mildly dilated. - Pulmonary arteries: Systolic pressure was mildly increased. PA peak pressure: 32 mm Hg (S). Impressions: - Normal LV function; mild LAE; mild MR and TR; mildly elevated pulmonary pressure.   ASSESSMENT AND PLAN  1. Paroxysmal atrial fibrillation. She is in AF with fast VR, on ASA 325 and heparin SQ DVT prophylaxis.  Echocardiogram  shows good left ventricular systolic function with ejection fraction 55-60%. There were no wall motion abnormalities. There is mild mitral regurgitation and mild left atrial enlargement.  2. Cholelithiasis. If she requires surgery, Dr Mare Ferrari feels that she would not be a prohibitive risk. 3. Thrombocytopenia 4. Possible staph cellulitis or erysipelas of abdominal right lateral wall. 5. Septic shock 6. Acute renal failure   Plan: She was in  shock earlier this admission, now off pressors and B/P is controlled. HR is fast, worse when coughing. Her I/O is positive 7.7 L. and SCr 5. If she has diastolic / volume overload CHF causing her cough it will be difficult to treat.  Start low dose Diltiazem for rate control.   Henri Medal PA  Agree with above assessment. Now back on IV diltiazem and BP is  remaining stable so far. Heart rate improving. Cough has subsided somewhat. Chest reveals a few scattered rhonchi. Heart no gallop.

## 2015-01-27 NOTE — Progress Notes (Signed)
PULMONARY / CRITICAL CARE MEDICINE   Name: Amanda Wilkins MRN: [id number] DOB: [birth year]    ADMISSION DATE:  01/22/2015 CONSULTATION DATE:  01/24/15  REFERRING MD :  Dr. Broadus John   CHIEF COMPLAINT:  Sepsis   INITIAL PRESENTATION: 69 y/o F admitted 4/9 with a 2 day hx of N/V/D, fever/malaise and palpitations.  PCCM consulted for evaluation 4/11. Developed hypotension with cardizem gtt.     STUDIES:  4/10  CT Chest >> inflammation in R axilla extending inferiorly along R chest, no focal evidence of abscess or gas, mild dependent atx 4/11  CT Abd/Pelvis >> no renal obs, Gallbladder distention with possible internal gallstone identified 4/11  Renal US >> no hydronephrosis, cholelithiasis, small L pleural effusion  4/12  [place] Chest >> sub-q and chest wall muscle edema with no discrete or drainable fluid collection, regional lymph nodes normal 4/12  LE Duplex >> neg for DVT 4/12  RUE Duplex >> neg for DVT 4/13  HIDA Scan >> delayed filling of gallbladder, but the gallbladder fills after morphine administration, no evidence of biliary obstruction 4/14  MRI Abd >>  4/14  MRI Chest >>   SIGNIFICANT EVENTS: 4/09  Admit with N/V/D. Lactic acid 3.6 4/11  PCCM consulted for sepsis.  Lactic acid 1.4 4/12  Hypotensive on dilt gtt, tachy on dopamine gtt, some resp distress with fluids 4/13  Off pressors 4/14  Desaturated overnight into mid 80's on RA while sleeping    SUBJECTIVE:  Pt reports dry cough.  RN notes new are of erythema on lower posterior back.  Desaturated overnight into mid 80's on room air while sleeping   VITAL SIGNS: Temp:  [97.3 F (36.3 C)-98 F (36.7 C)] 97.3 F (36.3 C) (04/14 0826) Pulse Rate:  [54-139] 114 (04/14 1000) Resp:  [13-25] 21 (04/14 1000) BP: (105-154)/(46-109) 129/73 mmHg (04/14 1000) SpO2:  [92 %-100 %] 95 % (04/14 1000) Weight:  [252 lb 10.4 oz (114.6 kg)] 252 lb 10.4 oz (114.6 kg) (04/14 0600)   HEMODYNAMICS: CVP:  [17 mmHg] 17 mmHg    INTAKE /  OUTPUT:  Intake/Output Summary (Last 24 hours) at 01/27/15 1131 Last data filed at 01/27/15 1000  Gross per 24 hour  Intake   3260 ml  Output    680 ml  Net   2580 ml   PHYSICAL EXAMINATION: General:  Obese female in NAD Neuro:  AAOx4, speech clear, MAE to command HEENT:  MM pink/moist, short / thick neck, PERRL, EOM-I and MMM Cardiovascular:  s1s2 distant, irr irr  Lungs:  resp's even/non-labored, lungs bilaterally diminished lower posterior Abdomen:  Obese, soft, bsx4 active  Musculoskeletal:  No acute deformities  Skin:  Moist, warm, no overt edema except at the right flank where there is erythema and fluctuant skin.  LABS:  CBC  Recent Labs Lab 01/25/15 0500 01/26/15 0400 01/27/15 0420  WBC 12.2* 12.5* 14.0*  HGB 11.7* 10.5* 9.9*  HCT 32.9* 28.6* 26.8*  PLT 80* 67* 81*   Coag's  Recent Labs Lab 01/24/15 2043  APTT 35  INR 1.18    BMET  Recent Labs Lab 01/25/15 0500 01/26/15 0400 01/27/15 0420  NA 125* 126* 128*  K 3.8 3.0* 3.6  CL 97 94* 94*  CO2 13* 15* 19  BUN 66* 79* 83*  CREATININE 4.06* 4.66* 5.00*  GLUCOSE 92 111* 123*   Electrolytes  Recent Labs Lab 01/22/15 1750  01/25/15 0500 01/26/15 0400 01/27/15 0420  CALCIUM 8.6  < > 7.2* 7.4* 7.8*  MG  1.8  --   --  2.4 2.6*  PHOS 3.3  --   --  5.6* 5.3*  < > = values in this interval not displayed.   Sepsis Markers  Recent Labs Lab 01/23/15 1900 01/24/15 1045 01/24/15 2043 01/26/15 0400  LATICACIDVEN 3.3* 1.4 1.3  --   PROCALCITON  --   --  48.23 37.72   ABG  Recent Labs Lab 01/24/15 1130 01/24/15 2300  PHART 7.386 7.300*  PCO2ART 19.9* 22.8*  PO2ART 90.8 137.0*   Liver Enzymes  Recent Labs Lab 01/24/15 0500 01/25/15 0500  AST 54* 38*  ALT 39* 37*  ALKPHOS 38* 41  BILITOT 1.4* 1.2  ALBUMIN 2.2* 2.0*   Cardiac Enzymes  Recent Labs Lab 01/23/15 1050 01/23/15 1230  TROPONINI 0.03 <0.03   Glucose  Recent Labs Lab 01/24/15 2237 01/26/15 1607  GLUCAP 89  128*   Imaging Nm Hepatobiliary Including Gb  01/26/2015   CLINICAL DATA:  Gallstones.  Distended gallbladder on prior CT.  EXAM: NUCLEAR MEDICINE HEPATOBILIARY IMAGING  TECHNIQUE: Sequential images of the abdomen were obtained out to 60 minutes following intravenous administration of radiopharmaceutical.  RADIOPHARMACEUTICALS:  5.0 Millicurie VQ-[id number] Choletec  COMPARISON:  CT 01/24/2015  FINDINGS: Prompt uptake and excretion of radiotracer by the liver. Activity is seen in small bowel by 30 minutes. No activity noted in the gallbladder at 90 minutes. Therefore, 4 mg of morphine was administered intravenously. Continued imaging demonstrates filling of the gallbladder post morphine.  IMPRESSION: Delayed filling of the gallbladder, but the gallbladder fills after morphine administration compatible with patent cystic duct. No evidence of biliary obstruction.   Electronically Signed   By: Rolm Baptise M.D.   On: 01/26/2015 14:28   ASSESSMENT / PLAN:  PULMONARY A: Persistent dependent Atelectasis  Tachypnea - metabolic acidosis Acute pulm edema High D-dimer, but doubt PE, will not scan given renal function. Suspected OSA - noted desaturations to 84% overnight 4/14 Cough P:   Pulmonary hygiene Oxygen to support sats > 92% Nocturnal CPAP given desaturations Venous duplex neg, hold off VQ  Will start IS per RT protocol to combat atelectasis. Tussionex BID for cough  CARDIOVASCULAR PICC LUE 4/10 >>  A:  Persistent Atrial Fibrillation - hx of PAF in past, currently related to sepsis of unclear etiology  Septic shock P:  Cardizem gtt, transition to PO when able. ASA.  RENAL A:   Acute Kidney Injury - in setting of sepsis Lactic Acidosis - resolving.  AG 7 Hyponatremia  Persistent metabolic acidosis. P:   Hold nephrotoxic agents. Continue bicarb drip @50  ml/hr. Renal consult appreciated, hold dialysis for now.  Rate of rise of sr cr is declining.  Hopeful to avoid HD.  Monitor  I/O's. Renal dose abx/meds per pharmacy.  GASTROINTESTINAL A:   Obesity N/V/D  Suspect Viral Gastroenteritis  Cholecystitis ruled out.  P:   PRN Zofran. RUQ U/S pending PPI to ensure reflux not contributing to cough  HEMATOLOGIC A:   Thrombocytopenia  P:  Trend CBC DVT: heparin sq  INFECTIOUS A:   Septic shock - GPC's. Favor cellulitis over gastroenteritis or acute chole Nausea / Vomiting / Diarrhea Chest Wall Induration / R Breast / Axilla Pain  - new erythema noted on R chest and low back 4/14 P:   BCx2 4/11 >> Group A Strep Pyogenes >>  C-Diff 4/11 >> neg Flu 4/11 >> neg  UA 4/10 >> few bacteria, 7-10 WBC  UC 4/11 >> neg  Vanco, start date 4/10, day  5/x Zosyn, start date 4/10, day 5/x  Trend PCT ID following, appreciate input  ENDOCRINE A:   Mild Hyperglycemia P:   Monitor glucose on BMP, if consistently > 180 add SSI   NEUROLOGIC A:   Acute encephalopathy - sleep talking Depression  P:   RASS goal: n/a Continue zoloft, per primary SVC  FAMILY  - Updates: Patient updated at bedside.    Noe Gens, NP-C [facility] Pgr: [phone] or [phone]  BP stable, cough but breathing comfortably, desat overnight likely related to OSA.  New and severe desaturation overnight: likely OSA related, will make CPAP available overnight and will need sleep study once outpatient. Worsening sepsis with increase in rash: will adjust abx and use topical cream for rash.   Hypoxemia consistent: will titrate O2 for sats. Worsening renal failure: renal following, hold dialysis for now. Metabolic acidosis persistent: bicarb drip.  Will transfer care to SDU and TRH, PCCM will sign off, please call back if needed.  Patient seen and examined, agree with above note.  I dictated the care and orders written for this patient under my direction.  Rush Farmer, MD [phone]  01/27/2015, 11:31 AM

## 2015-01-27 NOTE — Progress Notes (Signed)
Patient continues to cough with little to no relief from PRN cough medications. Doctors are aware patient has this cough and has added a new PRN cough medication.  Patients oxygen has maintained stable this morning but has required more oxygen this afternoon. Is now on 3 liters nasal canula. Oxygen saturation 90%. Patient has small white bumps on her tongue. Will make the MD aware. Amanda Wilkins

## 2015-01-27 NOTE — Consult Note (Signed)
WOC wound consult note Reason for Consult: Consult requested for cellulitis areas to right anterior and posterior chest and flank areas.  CCS and ID have consulted and are providing input.  Currently, there is generalized edema and erythemia, previously marked areas are beginning to recede. Wound type: There are currently no open wounds, drainage, or weeping. Dressing procedure/placement/frequency: If any of the above begin to occur, then apply Xerofom gauze, abd pads, and mesh tube top to hold in place. Please re-consult if further assistance is needed.  Thank-you,  Julien Girt MSN, [place], [place], [place], [place]

## 2015-01-27 NOTE — Progress Notes (Signed)
Talked with Cardiology PA, Lurena Joiner and he said it is okay to titrate Cardizem drip up to 10 mg if blood pressure tolerates it. No new problems at this time.  Will continue to monitor.  Amanda Wilkins

## 2015-01-27 NOTE — Progress Notes (Signed)
Placed pt on Cpap . 5 cmh20 with 4 lpm bled in.  Pt has never worn cpap or bipap she is tolerating well at this time

## 2015-01-27 NOTE — Progress Notes (Signed)
Assessment:  1 Nonoliguric worsening AKI, hemodynamically mediated due to afib, hypotension, bacteremia, COX-2 inhib. Mild uremia 2 Group A strep 1/2 pos BC 3 Sepsis, improving 4 Acidosis, better 5 Hyponatremia, better 6 Hypokalemia  Plan: 1 Supportive therapy 2 I am hopeful of the avoidance of dialysis as rate of rise of creat is declining   Subjective: Interval History: Maybe feels better  Objective: Vital signs in last 24 hours: Temp:  [97.3 F (36.3 C)-98 F (36.7 C)] 97.3 F (36.3 C) (04/14 0826) Pulse Rate:  [54-139] 86 (04/14 0810) Resp:  [13-25] 20 (04/14 0810) BP: (105-154)/(46-111) 145/78 mmHg (04/14 0810) SpO2:  [94 %-100 %] 97 % (04/14 0836) Weight:  [114.6 kg (252 lb 10.4 oz)] 114.6 kg (252 lb 10.4 oz) (04/14 0600) Weight change: 2.3 kg (5 lb 1.1 oz)  Intake/Output from previous day: 04/13 0701 - 04/14 0700 In: 2930 [P.O.:1060; I.V.:1520; IV Piggyback:350] Out: 880 [Urine:880] Intake/Output this shift: Total I/O In: 290 [P.O.:240; I.V.:50] Out: -   General appearance: alert, cooperative and looks brighter Breasts: right chest nontender Extremities: 2-3+ edema  Lab Results:  Recent Labs  01/26/15 0400 01/27/15 0420  WBC 12.5* 14.0*  HGB 10.5* 9.9*  HCT 28.6* 26.8*  PLT 67* 81*   BMET:  Recent Labs  01/26/15 0400 01/27/15 0420  NA 126* 128*  K 3.0* 3.6  CL 94* 94*  CO2 15* 19  GLUCOSE 111* 123*  BUN 79* 83*  CREATININE 4.66* 5.00*  CALCIUM 7.4* 7.8*   No results for input(s): PTH in the last 72 hours. Iron Studies: No results for input(s): IRON, TIBC, TRANSFERRIN, FERRITIN in the last 72 hours. Studies/Results: Nm Hepatobiliary Including Gb  01/26/2015   CLINICAL DATA:  Gallstones.  Distended gallbladder on prior CT.  EXAM: NUCLEAR MEDICINE HEPATOBILIARY IMAGING  TECHNIQUE: Sequential images of the abdomen were obtained out to 60 minutes following intravenous administration of radiopharmaceutical.  RADIOPHARMACEUTICALS:  5.0  Millicurie [id number] Choletec  COMPARISON:  CT 01/24/2015  FINDINGS: Prompt uptake and excretion of radiotracer by the liver. Activity is seen in small bowel by 30 minutes. No activity noted in the gallbladder at 90 minutes. Therefore, 4 mg of morphine was administered intravenously. Continued imaging demonstrates filling of the gallbladder post morphine.  IMPRESSION: Delayed filling of the gallbladder, but the gallbladder fills after morphine administration compatible with patent cystic duct. No evidence of biliary obstruction.   Electronically Signed   By: Rolm Baptise M.D.   On: 01/26/2015 14:28   [place] Chest  01/25/2015   CLINICAL DATA:  69 year old female with right axillary and chest wall pain. Fever and sepsis. Initial encounter.  EXAM: CHEST ULTRASOUND  COMPARISON:  Chest CT 01/23/2015.  FINDINGS: Ultrasound imaging in the area of clinical concern corresponding to the right axilla and chest wall.  Lymph nodes with normal fatty hila. Diffuse subcutaneous edema. Evidence of edematous chest wall muscle (image 19) with separation of muscle fibers, but no discrete or drainable fluid collection.  IMPRESSION: Subcutaneous and chest wall muscle edema with no discrete or drainable fluid collection. Regional lymph nodes remain normal.   Electronically Signed   By: Genevie Ann M.D.   On: 01/25/2015 12:11   Dg Chest Port 1 View  01/27/2015   CLINICAL DATA:  69 year old female with sepsis, septic shock. Initial encounter.  EXAM: PORTABLE CHEST - 1 VIEW  COMPARISON:  01/24/2015 and earlier.  FINDINGS: Portable AP upright view at 0802 hours. Stable left PICC line. Mildly improved lung volumes and bibasilar ventilation. Bilateral  increased interstitial markings have regressed and now appear at baseline compared to [place] stands. No pneumothorax. No pleural effusion or consolidation. Stable cardiac size and mediastinal contours.  IMPRESSION: Regressed bilateral interstitial opacity, lung parenchyma now all appears at baseline  compared to 2015 studies. No new cardiopulmonary abnormality.   Electronically Signed   By: Genevie Ann M.D.   On: 01/27/2015 08:08   US Abdomen Limited Ruq  01/25/2015   CLINICAL DATA:  Upper abdominal pain  EXAM: US ABDOMEN LIMITED - RIGHT UPPER QUADRANT  COMPARISON:  None.  FINDINGS: Gallbladder:  There is a a focal gallstone measuring 1.9 cm in length which moves and shadows. Gallbladder wall is not thickened, and there is no pericholecystic fluid. No sonographic Murphy sign noted.  Common bile duct:  Diameter: 5 mm. No intrahepatic or extrahepatic biliary duct dilatation.  Liver:  No focal lesion identified. Liver echogenicity is diffusely increased.  There is a small right pleural effusion.  IMPRESSION: Cholelithiasis. Increased liver echogenicity, most likely due to hepatic steatosis. While no focal liver lesions are identified, it must be cautioned that the sensitivity of ultrasound for focal liver lesions is diminished in this circumstance. Small right pleural effusion noted.   Electronically Signed   By: Lowella Grip III M.D.   On: 01/25/2015 12:15   Scheduled: . antiseptic oral rinse  7 mL Mouth Rinse q12n4p  . aspirin EC  325 mg Oral Daily  . cefTRIAXone (ROCEPHIN)  IV  2 g Intravenous Q24H  . chlorhexidine  15 mL Mouth Rinse BID  . clindamycin (CLEOCIN) IV  900 mg Intravenous 3 times per day  . estrogens (conjugated)  0.3 mg Oral Q48H  . heparin  5,000 Units Subcutaneous 3 times per day  . ipratropium-albuterol  3 mL Nebulization Q4H  . LORazepam  1 mg Oral Once  . sertraline  50 mg Oral Daily  . sodium chloride  3 mL Intravenous Q12H     LOS: 4 days   Rylin Saez C 01/27/2015,9:58 AM

## 2015-01-27 NOTE — Progress Notes (Signed)
Dr. Joya Gaskins came by this morning and made it clear he thinks patient needs to stay in ICU. Dr. Nelda Marseille wants to transfer patient to stepdown. I have made Dr. Nelda Marseille aware of Dr. Ileana Roup wishes and he still wants to transfer patient to stepdown. Will continue to monitor.    Sandre Kitty

## 2015-01-27 NOTE — Progress Notes (Signed)
[facility]        Subjective:  Dry non productive cough, she has a new area of erythema on her back see pics  Antibiotics:  Anti-infectives    Start     Dose/Rate Route Frequency Ordered Stop   01/26/15 1530  cefTRIAXone (ROCEPHIN) 2 g in dextrose 5 % 50 mL IVPB - Premix     2 g 100 mL/hr over 30 Minutes Intravenous Every 24 hours 01/26/15 1505     01/25/15 1200  vancomycin (VANCOCIN) 1,500 mg in sodium chloride 0.9 % 500 mL IVPB  Status:  Discontinued     1,500 mg 250 mL/hr over 120 Minutes Intravenous Every 48 hours 01/23/15 1417 01/25/15 1015   01/25/15 1100  clindamycin (CLEOCIN) IVPB 900 mg     900 mg 100 mL/hr over 30 Minutes Intravenous 3 times per day 01/25/15 1014     01/24/15 1600  doxycycline (VIBRAMYCIN) 100 mg in dextrose 5 % 250 mL IVPB  Status:  Discontinued     100 mg 125 mL/hr over 120 Minutes Intravenous Every 12 hours 01/24/15 1500 01/25/15 1013   01/24/15 1400  piperacillin-tazobactam (ZOSYN) IVPB 2.25 g  Status:  Discontinued     2.25 g 100 mL/hr over 30 Minutes Intravenous 4 times per day 01/24/15 1148 01/26/15 1505   01/24/15 1000  vancomycin (VANCOCIN) IVPB 1000 mg/200 mL premix  Status:  Discontinued     1,000 mg 200 mL/hr over 60 Minutes Intravenous Every 24 hours 01/23/15 0846 01/23/15 1417   01/23/15 2200  piperacillin-tazobactam (ZOSYN) IVPB 3.375 g  Status:  Discontinued     3.375 g 12.5 mL/hr over 240 Minutes Intravenous Every 8 hours 01/23/15 0846 01/24/15 1148   01/23/15 1000  oseltamivir (TAMIFLU) capsule 30 mg  Status:  Discontinued     30 mg Oral 2 times daily 01/23/15 0803 01/23/15 1413   01/23/15 0900  vancomycin (VANCOCIN) 2,000 mg in sodium chloride 0.9 % 500 mL IVPB     2,000 mg 250 mL/hr over 120 Minutes Intravenous  Once 01/23/15 0846 01/23/15 1401   01/23/15 0845  piperacillin-tazobactam (ZOSYN) IVPB 3.375 g     3.375 g 100 mL/hr over 30 Minutes Intravenous  Once 01/23/15 0846 01/23/15 2013       Medications: Scheduled Meds: . antiseptic oral rinse  7 mL Mouth Rinse q12n4p  . aspirin EC  325 mg Oral Daily  . cefTRIAXone (ROCEPHIN)  IV  2 g Intravenous Q24H  . chlorhexidine  15 mL Mouth Rinse BID  . chlorpheniramine-HYDROcodone  5 mL Oral Q12H  . clindamycin (CLEOCIN) IV  900 mg Intravenous 3 times per day  . estrogens (conjugated)  0.3 mg Oral Q48H  . heparin  5,000 Units Subcutaneous 3 times per day  . ipratropium-albuterol  3 mL Nebulization Q6H  . LORazepam  1 mg Oral Once  . pantoprazole  40 mg Oral BID AC  . sertraline  50 mg Oral Daily  . sodium chloride  3 mL Intravenous Q12H   Continuous Infusions: . diltiazem (CARDIZEM) infusion 5 mg/hr (01/27/15 1235)  .  sodium bicarbonate  infusion 1000 mL 50 mL/hr at 01/27/15 1200   PRN Meds:.sodium chloride, acetaminophen **OR** acetaminophen, guaifenesin, levalbuterol, loperamide, ondansetron **OR** ondansetron (ZOFRAN) IV, sodium chloride    Objective: Weight change: 5 lb 1.1 oz (2.3 kg)  Intake/Output Summary (Last 24 hours) at 01/27/15 1249 Last data filed at 01/27/15 1200  Gross per 24 hour  Intake   3360  ml  Output    840 ml  Net   2520 ml   Blood pressure 141/79, pulse 130, temperature 97.2 F (36.2 C), temperature source Oral, resp. rate 26, height 5\' 6"  (1.676 m), weight 252 lb 10.4 oz (114.6 kg), SpO2 90 %. Temp:  [97.2 F (36.2 C)-98 F (36.7 C)] 97.2 F (36.2 C) (04/14 1200) Pulse Rate:  [54-139] 130 (04/14 1100) Resp:  [13-26] 26 (04/14 1200) BP: (105-154)/(46-109) 141/79 mmHg (04/14 1100) SpO2:  [90 %-100 %] 90 % (04/14 1200) Weight:  [252 lb 10.4 oz (114.6 kg)] 252 lb 10.4 oz (114.6 kg) (04/14 0600)  Physical Exam: General: Alert and awake, but was initially biting at her O2 probe HEENT: anicteric sclera, pupils reactive to light and accommodation, EOMI CVS tachy rate, irr, irr  no murmur rubs or gallops Chest: clear to auscultation bilaterally, no wheezing, rales or rhonchi Abdomen: soft  TTP in RUQ, no rebound  Lymph/skin new progressive almost bullous cellulitis extending down flank (which is NOT where she initially presented with pain   Left axilla, breast 01/24/15    01/26/15:      Flank 01/26/15:     01/27/15:       Back 01/27/15:      Neuro: nonfocal  CBC:  CBC Latest Ref Rng 01/27/2015 01/26/2015 01/25/2015  WBC 4.0 - 10.5 K/uL 14.0(H) 12.5(H) 12.2(H)  Hemoglobin 12.0 - 15.0 g/dL 9.9(L) 10.5(L) 11.7(L)  Hematocrit 36.0 - 46.0 % 26.8(L) 28.6(L) 32.9(L)  Platelets 150 - 400 K/uL 81(L) 67(L) 80(L)       BMET  Recent Labs  01/26/15 0400 01/27/15 0420  NA 126* 128*  K 3.0* 3.6  CL 94* 94*  CO2 15* 19  GLUCOSE 111* 123*  BUN 79* 83*  CREATININE 4.66* 5.00*  CALCIUM 7.4* 7.8*     Liver Panel   Recent Labs  01/25/15 0500  PROT 5.0*  ALBUMIN 2.0*  AST 38*  ALT 37*  ALKPHOS 41  BILITOT 1.2       Sedimentation Rate No results for input(s): ESRSEDRATE in the last 72 hours. C-Reactive Protein No results for input(s): CRP in the last 72 hours.  Micro Results: Recent Results (from the past 720 hour(s))  Culture, blood (routine x 2)     Status: None (Preliminary result)   Collection Time: 01/23/15 12:23 PM  Result Value Ref Range Status   Specimen Description BLOOD RIGHT HAND  Final   Special Requests BOTTLES DRAWN AEROBIC ONLY 4CC  Final   Culture   Final    GROUP A STREP (S.PYOGENES) ISOLATED Note: CRITICAL RESULT CALLED TO, READ BACK BY AND VERIFIED WITH: CHRISTINE HINSHAW @ 0258 ON [id number] BY [place] Note: Gram Stain Report Called to,Read Back By and Verified With: Sherri Rad RN on 01/24/15 at 06:55 by Rise Mu Performed at [facility]    Report Status PENDING  Incomplete  Culture, blood (routine x 2)     Status: None (Preliminary result)   Collection Time: 01/23/15 12:30 PM  Result Value Ref Range Status   Specimen Description BLOOD LEFT HAND  Final   Special Requests BOTTLES DRAWN AEROBIC ONLY 5CC   Final   Culture   Final           BLOOD CULTURE RECEIVED NO GROWTH TO DATE CULTURE WILL BE HELD FOR 5 DAYS BEFORE ISSUING A FINAL NEGATIVE REPORT Performed at [facility]    Report Status PENDING  Incomplete  Clostridium Difficile by PCR  Status: None   Collection Time: 01/24/15 12:59 PM  Result Value Ref Range Status   C difficile by pcr NEGATIVE NEGATIVE Final  Culture, Urine     Status: None   Collection Time: 01/24/15  2:02 PM  Result Value Ref Range Status   Specimen Description URINE, CATHETERIZED  Final   Special Requests NONE  Final   Colony Count NO GROWTH Performed at [facility]   Final   Culture NO GROWTH Performed at [facility]   Final   Report Status 01/26/2015 FINAL  Final  MRSA PCR Screening     Status: None   Collection Time: 01/24/15  4:30 PM  Result Value Ref Range Status   MRSA by PCR NEGATIVE NEGATIVE Final    Comment:        The GeneXpert MRSA Assay (FDA approved for NASAL specimens only), is one component of a comprehensive MRSA colonization surveillance program. It is not intended to diagnose MRSA infection nor to guide or monitor treatment for MRSA infections.     Studies/Results: Nm Hepatobiliary Including Gb  01/26/2015   CLINICAL DATA:  Gallstones.  Distended gallbladder on prior CT.  EXAM: NUCLEAR MEDICINE HEPATOBILIARY IMAGING  TECHNIQUE: Sequential images of the abdomen were obtained out to 60 minutes following intravenous administration of radiopharmaceutical.  RADIOPHARMACEUTICALS:  5.0 [employer] HL-45G Choletec  COMPARISON:  CT 01/24/2015  FINDINGS: Prompt uptake and excretion of radiotracer by the liver. Activity is seen in small bowel by 30 minutes. No activity noted in the gallbladder at 90 minutes. Therefore, 4 mg of morphine was administered intravenously. Continued imaging demonstrates filling of the gallbladder post morphine.  IMPRESSION: Delayed filling of the gallbladder, but the gallbladder fills  after morphine administration compatible with patent cystic duct. No evidence of biliary obstruction.   Electronically Signed   By: Rolm Baptise M.D.   On: 01/26/2015 14:28   Dg Chest Port 1 View  01/27/2015   CLINICAL DATA:  69 year old female with sepsis, septic shock. Initial encounter.  EXAM: PORTABLE CHEST - 1 VIEW  COMPARISON:  01/24/2015 and earlier.  FINDINGS: Portable AP upright view at 0802 hours. Stable left PICC line. Mildly improved lung volumes and bibasilar ventilation. Bilateral increased interstitial markings have regressed and now appear at baseline compared to [place] stands. No pneumothorax. No pleural effusion or consolidation. Stable cardiac size and mediastinal contours.  IMPRESSION: Regressed bilateral interstitial opacity, lung parenchyma now all appears at baseline compared to 2015 studies. No new cardiopulmonary abnormality.   Electronically Signed   By: Genevie Ann M.D.   On: 01/27/2015 08:08      Assessment/Plan:  Principal Problem:   Atrial fibrillation with RVR Active Problems:   Palpitations   Upper respiratory disease   Dehydration   Breast pain, right   A-fib   Fever   Nausea & vomiting   Sepsis   Acute renal insufficiency   Abdominal pain   Axillary pain   Dyspnea   Hypoxia   Thrombocytopenia   Transaminitis   Hyperbilirubinemia   FUO (fever of unknown origin)   Septic shock   AKI (acute kidney injury)   Cholecystitis   DIC (disseminated intravascular coagulation)   Group A streptococcal infection   Cellulitis   D-dimer, elevated    Haizel Gatchell is a 69 y.o. female with fevers, axillary LN pain, breast pain ARF, TTpenia, yesterday with apparent clinical worsening on vancomycin and zosyn with worsenign renal fxn, tachypnea, TTPenia --> ICU. She has coag profile c/w more  chronic DIC. NOW after visiting her she is found to have GROUP A strep in one of TWO blood cultures   #1 Severe Sepsis with shock due to GAS. She has new area on back,,  potentially toxin mediated  I am ordering MRI chest, abdomen to look for abscess, evidence of fascitis or something that would require surgery.  Son and Dr. Joya Gaskins had made me aware of grandchildren who had impetigo that was likely source of pts severe GAS  ceftriaxone once daily for a bactericidal beta-lactam for group A strep Continue clindamycin for inhibition of ribosomal production of toxins  #2 Shoulder pain Better today but will get MRI of shoulder during this admission not today given priority of looking at abdomen flank, chest  #3 Gallstones: sp HIDA, biliary system does not seen to be the nidus of her infection now that she is had clear-cut declaration of her group A strep bacteremia and cellulitis soft tissue infection   #3 thrombocytopenia: Likely from DIC. Doubt beta-lactam induced thrombocytopenia could be hit potentially possibly.  #4 renal failure multifactorial with sepsis NSAIDs and vancomycin the role of the picture      LOS: 4 days   Alcide Evener 01/27/2015, 12:49 PM

## 2015-01-27 NOTE — Progress Notes (Signed)
[facility] Progress Note     Subjective: She says her abdominal pain and flank pain is improved today, now she's coughing a lot.  No N/V, tolerating clears well.  Wants to eat more.  Up in chair this am.  The patient tells me that children came from [place] to visit and had red rash on faces.  Objective: Vital signs in last 24 hours: Temp:  [97.3 F (36.3 C)-98 F (36.7 C)] 98 F (36.7 C) (04/14 0358) Pulse Rate:  [54-139] 116 (04/14 0700) Resp:  [13-25] 25 (04/14 0700) BP: (105-154)/(46-118) 154/83 mmHg (04/14 0700) SpO2:  [94 %-100 %] 97 % (04/14 0700) Weight:  [114.6 kg (252 lb 10.4 oz)] 114.6 kg (252 lb 10.4 oz) (04/14 0600) Last BM Date: 01/25/15 (smear)  Intake/Output from previous day: 04/13 0701 - 04/14 0700 In: 2930 [P.O.:1060; I.V.:1520; IV Piggyback:350] Out: 880 [Urine:880] Intake/Output this shift:    PE: Gen:  Alert, NAD, pleasant Card:  RRR, no M/G/R heard Pulm:  CTA, no W/R/R Abd: Obese, soft, mild distension, non-tender today, +BS, no HSM Skin: Large indurated patch on her right flank/side extending from under right breast to right groin, blistering lesions (like anasarca - ish)   01/26/15   Lab Results:   Recent Labs  01/26/15 0400 01/27/15 0420  WBC 12.5* 14.0*  HGB 10.5* 9.9*  HCT 28.6* 26.8*  PLT 67* 81*   BMET  Recent Labs  01/26/15 0400 01/27/15 0420  NA 126* 128*  K 3.0* 3.6  CL 94* 94*  CO2 15* 19  GLUCOSE 111* 123*  BUN 79* 83*  CREATININE 4.66* 5.00*  CALCIUM 7.4* 7.8*   PT/INR  Recent Labs  01/24/15 2043  LABPROT 15.1  INR 1.18   CMP     Component Value Date/Time   NA 128* 01/27/2015 0420   K 3.6 01/27/2015 0420   CL 94* 01/27/2015 0420   CO2 19 01/27/2015 0420   GLUCOSE 123* 01/27/2015 0420   BUN 83* 01/27/2015 0420   CREATININE 5.00* 01/27/2015 0420   CREATININE 0.68 05/27/2014 0923   CALCIUM 7.8* 01/27/2015 0420   PROT 5.0* 01/25/2015 0500   ALBUMIN 2.0* 01/25/2015 0500   AST 38*  01/25/2015 0500   ALT 37* 01/25/2015 0500   ALKPHOS 41 01/25/2015 0500   BILITOT 1.2 01/25/2015 0500   GFRNONAA 8* 01/27/2015 0420   GFRAA 9* 01/27/2015 0420   Lipase     Component Value Date/Time   LIPASE 36.0 11/10/2013 1541       Studies/Results: Nm Hepatobiliary Including Gb  01/26/2015   CLINICAL DATA:  Gallstones.  Distended gallbladder on prior CT.  EXAM: NUCLEAR MEDICINE HEPATOBILIARY IMAGING  TECHNIQUE: Sequential images of the abdomen were obtained out to 60 minutes following intravenous administration of radiopharmaceutical.  RADIOPHARMACEUTICALS:  5.0 [employer] PZ-02H Choletec  COMPARISON:  CT 01/24/2015  FINDINGS: Prompt uptake and excretion of radiotracer by the liver. Activity is seen in small bowel by 30 minutes. No activity noted in the gallbladder at 90 minutes. Therefore, 4 mg of morphine was administered intravenously. Continued imaging demonstrates filling of the gallbladder post morphine.  IMPRESSION: Delayed filling of the gallbladder, but the gallbladder fills after morphine administration compatible with patent cystic duct. No evidence of biliary obstruction.   Electronically Signed   By: Rolm Baptise M.D.   On: 01/26/2015 14:28   [place] Chest  01/25/2015   CLINICAL DATA:  69 year old female with right axillary and chest wall pain. Fever and sepsis. Initial encounter.  EXAM:  CHEST ULTRASOUND  COMPARISON:  Chest CT 01/23/2015.  FINDINGS: Ultrasound imaging in the area of clinical concern corresponding to the right axilla and chest wall.  Lymph nodes with normal fatty hila. Diffuse subcutaneous edema. Evidence of edematous chest wall muscle (image 19) with separation of muscle fibers, but no discrete or drainable fluid collection.  IMPRESSION: Subcutaneous and chest wall muscle edema with no discrete or drainable fluid collection. Regional lymph nodes remain normal.   Electronically Signed   By: Genevie Ann M.D.   On: 01/25/2015 12:11   US Abdomen Limited Ruq  01/25/2015    CLINICAL DATA:  Upper abdominal pain  EXAM: US ABDOMEN LIMITED - RIGHT UPPER QUADRANT  COMPARISON:  None.  FINDINGS: Gallbladder:  There is a a focal gallstone measuring 1.9 cm in length which moves and shadows. Gallbladder wall is not thickened, and there is no pericholecystic fluid. No sonographic Murphy sign noted.  Common bile duct:  Diameter: 5 mm. No intrahepatic or extrahepatic biliary duct dilatation.  Liver:  No focal lesion identified. Liver echogenicity is diffusely increased.  There is a small right pleural effusion.  IMPRESSION: Cholelithiasis. Increased liver echogenicity, most likely due to hepatic steatosis. While no focal liver lesions are identified, it must be cautioned that the sensitivity of ultrasound for focal liver lesions is diminished in this circumstance. Small right pleural effusion noted.   Electronically Signed   By: Lowella Grip III M.D.   On: 01/25/2015 12:15    Anti-infectives: Anti-infectives    Start     Dose/Rate Route Frequency Ordered Stop   01/26/15 1530  cefTRIAXone (ROCEPHIN) 2 g in dextrose 5 % 50 mL IVPB - Premix     2 g 100 mL/hr over 30 Minutes Intravenous Every 24 hours 01/26/15 1505     01/25/15 1200  vancomycin (VANCOCIN) 1,500 mg in sodium chloride 0.9 % 500 mL IVPB  Status:  Discontinued     1,500 mg 250 mL/hr over 120 Minutes Intravenous Every 48 hours 01/23/15 1417 01/25/15 1015   01/25/15 1100  clindamycin (CLEOCIN) IVPB 900 mg     900 mg 100 mL/hr over 30 Minutes Intravenous 3 times per day 01/25/15 1014     01/24/15 1600  doxycycline (VIBRAMYCIN) 100 mg in dextrose 5 % 250 mL IVPB  Status:  Discontinued     100 mg 125 mL/hr over 120 Minutes Intravenous Every 12 hours 01/24/15 1500 01/25/15 1013   01/24/15 1400  piperacillin-tazobactam (ZOSYN) IVPB 2.25 g  Status:  Discontinued     2.25 g 100 mL/hr over 30 Minutes Intravenous 4 times per day 01/24/15 1148 01/26/15 1505   01/24/15 1000  vancomycin (VANCOCIN) IVPB 1000 mg/200 mL premix   Status:  Discontinued     1,000 mg 200 mL/hr over 60 Minutes Intravenous Every 24 hours 01/23/15 0846 01/23/15 1417   01/23/15 2200  piperacillin-tazobactam (ZOSYN) IVPB 3.375 g  Status:  Discontinued     3.375 g 12.5 mL/hr over 240 Minutes Intravenous Every 8 hours 01/23/15 0846 01/24/15 1148   01/23/15 1000  oseltamivir (TAMIFLU) capsule 30 mg  Status:  Discontinued     30 mg Oral 2 times daily 01/23/15 0803 01/23/15 1413   01/23/15 0900  vancomycin (VANCOCIN) 2,000 mg in sodium chloride 0.9 % 500 mL IVPB     2,000 mg 250 mL/hr over 120 Minutes Intravenous  Once 01/23/15 0846 01/23/15 1401   01/23/15 0845  piperacillin-tazobactam (ZOSYN) IVPB 3.375 g     3.375 g 100 mL/hr over  30 Minutes Intravenous  Once 01/23/15 0846 01/23/15 2013       Assessment/Plan Cholelithiasis with possible cholecystitis  Abdominal pain  -HIDA with delayed filling more consistent with chronic cholecystitis, but patent cystic duct with morphine, no biliary obstruction.  No acute need for cholecystectomy, can follow up in the office as an OP. -Group A strep bacteremia would be unusual to be from gallbladder, more consistent with her current skin infection -Advance diet as tolerated -No breast/axillary abscess in [place], nothing to I&D Group A strep bacteremia -Skin source Flank erythema/induration/blistering and Right sided chest wall/axillary region pain -Looks like erysipelas compared to cellulitis of her right flank or could be toxic shock due to GAS -ID following and back on ceftriaxone and clindamycin Leukocytosis - 14.0 Septic shock Afib with RVR Acute renal failure - Cr 5.0 SOB/tachypnea      LOS: 4 days    Amanda Wilkins, Amanda Wilkins 01/27/2015, 7:53 AM Pager: [phone]

## 2015-01-28 ENCOUNTER — Inpatient Hospital Stay (HOSPITAL_COMMUNITY): Payer: Medicare Other

## 2015-01-28 DIAGNOSIS — J9600 Acute respiratory failure, unspecified whether with hypoxia or hypercapnia: Secondary | ICD-10-CM | POA: Diagnosis present

## 2015-01-28 DIAGNOSIS — B009 Herpesviral infection, unspecified: Secondary | ICD-10-CM | POA: Diagnosis present

## 2015-01-28 LAB — BASIC METABOLIC PANEL
ANION GAP: 16 — AB (ref 5–15)
BUN: 82 mg/dL — ABNORMAL HIGH (ref 6–23)
CALCIUM: 7.9 mg/dL — AB (ref 8.4–10.5)
CO2: 21 mmol/L (ref 19–32)
Chloride: 89 mmol/L — ABNORMAL LOW (ref 96–112)
Creatinine, Ser: 5.21 mg/dL — ABNORMAL HIGH (ref 0.50–1.10)
GFR, EST AFRICAN AMERICAN: 9 mL/min — AB (ref >90)
GFR, EST NON AFRICAN AMERICAN: 8 mL/min — AB (ref >90)
Glucose, Bld: 143 mg/dL — ABNORMAL HIGH (ref 70–99)
Potassium: 3.2 mmol/L — ABNORMAL LOW (ref 3.5–5.1)
SODIUM: 126 mmol/L — AB (ref 135–145)

## 2015-01-28 LAB — CBC
HEMATOCRIT: 26 % — AB (ref 36.0–46.0)
HEMOGLOBIN: 9.4 g/dL — AB (ref 12.0–15.0)
MCH: 33.3 pg (ref 26.0–34.0)
MCHC: 36.2 g/dL — AB (ref 30.0–36.0)
MCV: 92.2 fL (ref 78.0–100.0)
Platelets: 105 10*3/uL — ABNORMAL LOW (ref 150–400)
RBC: 2.82 MIL/uL — ABNORMAL LOW (ref 3.87–5.11)
RDW: 13.8 % (ref 11.5–15.5)
WBC: 13.1 10*3/uL — ABNORMAL HIGH (ref 4.0–10.5)

## 2015-01-28 LAB — PROCALCITONIN: Procalcitonin: 14.72 ng/mL

## 2015-01-28 LAB — CULTURE, BLOOD (ROUTINE X 2)

## 2015-01-28 LAB — GLUCOSE, CAPILLARY
Glucose-Capillary: 132 mg/dL — ABNORMAL HIGH (ref 70–99)
Glucose-Capillary: 132 mg/dL — ABNORMAL HIGH (ref 70–99)

## 2015-01-28 MED ORDER — POTASSIUM CHLORIDE 20 MEQ/15ML (10%) PO SOLN
40.0000 meq | Freq: Once | ORAL | Status: AC
  Administered 2015-01-28: 40 meq via ORAL
  Filled 2015-01-28 (×2): qty 30

## 2015-01-28 MED ORDER — NEPRO/CARBSTEADY PO LIQD
237.0000 mL | ORAL | Status: DC | PRN
  Filled 2015-01-28: qty 237

## 2015-01-28 MED ORDER — POTASSIUM CHLORIDE 20 MEQ PO PACK
40.0000 meq | PACK | Freq: Once | ORAL | Status: DC
  Filled 2015-01-28: qty 2

## 2015-01-28 MED ORDER — HEPARIN SODIUM (PORCINE) 1000 UNIT/ML DIALYSIS
20.0000 [IU]/kg | INTRAMUSCULAR | Status: DC | PRN
  Administered 2015-01-28: 2300 [IU] via INTRAVENOUS_CENTRAL
  Filled 2015-01-28: qty 3

## 2015-01-28 MED ORDER — POTASSIUM CHLORIDE CRYS ER 20 MEQ PO TBCR
40.0000 meq | EXTENDED_RELEASE_TABLET | Freq: Once | ORAL | Status: AC
  Administered 2015-01-28: 40 meq via ORAL
  Filled 2015-01-28: qty 2

## 2015-01-28 MED ORDER — FUROSEMIDE 10 MG/ML IJ SOLN: 80.0000 mg | Freq: Once | INTRAMUSCULAR | Status: DC

## 2015-01-28 MED ORDER — ASPIRIN 325 MG PO TABS
325.0000 mg | ORAL_TABLET | Freq: Every day | ORAL | Status: DC
  Administered 2015-01-28 – 2015-02-04 (×8): 325 mg via ORAL
  Filled 2015-01-28 (×8): qty 1

## 2015-01-28 MED ORDER — HYDROCODONE-HOMATROPINE 5-1.5 MG/5ML PO SYRP
5.0000 mL | ORAL_SOLUTION | ORAL | Status: DC | PRN
  Administered 2015-01-28 – 2015-01-31 (×5): 5 mL via ORAL
  Filled 2015-01-28 (×5): qty 5

## 2015-01-28 MED ORDER — LIDOCAINE HCL (PF) 1 % IJ SOLN: 5.0000 mL | INTRAMUSCULAR | Status: DC | PRN

## 2015-01-28 MED ORDER — DEXTROSE 5 % IV SOLN
200.0000 mg | Freq: Once | INTRAVENOUS | Status: AC
  Administered 2015-01-28: 200 mg via INTRAVENOUS
  Filled 2015-01-28: qty 20

## 2015-01-28 MED ORDER — PENTAFLUOROPROP-TETRAFLUOROETH EX AERO: 1.0000 "application " | INHALATION_SPRAY | CUTANEOUS | Status: DC | PRN

## 2015-01-28 MED ORDER — SODIUM CHLORIDE 0.9 % IV SOLN: 100.0000 mL | INTRAVENOUS | Status: DC | PRN

## 2015-01-28 MED ORDER — ALTEPLASE 2 MG IJ SOLR
2.0000 mg | Freq: Once | INTRAMUSCULAR | Status: AC | PRN
  Filled 2015-01-28: qty 2

## 2015-01-28 MED ORDER — LIDOCAINE-PRILOCAINE 2.5-2.5 % EX CREA
1.0000 "application " | TOPICAL_CREAM | CUTANEOUS | Status: DC | PRN
  Filled 2015-01-28: qty 5

## 2015-01-28 MED ORDER — FAMOTIDINE 20 MG PO TABS
20.0000 mg | ORAL_TABLET | Freq: Every day | ORAL | Status: DC
  Administered 2015-01-29 – 2015-02-08 (×11): 20 mg via ORAL
  Filled 2015-01-28 (×13): qty 1

## 2015-01-28 MED ORDER — HEPARIN SODIUM (PORCINE) 1000 UNIT/ML IJ SOLN
3000.0000 [IU] | Freq: Once | INTRAMUSCULAR | Status: AC
  Administered 2015-01-28: 3000 [IU] via INTRAVENOUS
  Filled 2015-01-28: qty 3

## 2015-01-28 MED ORDER — HEPARIN SODIUM (PORCINE) 1000 UNIT/ML DIALYSIS
1000.0000 [IU] | INTRAMUSCULAR | Status: DC | PRN
  Filled 2015-01-28: qty 1

## 2015-01-28 NOTE — Progress Notes (Signed)
Physical Therapy Treatment Patient Details Name: Amanda Wilkins MRN: [id number] DOB: Feb 07, 1946 Today's Date: 2015/02/11    History of Present Illness Adm 01/22/15 with n/v, general malaise, diarrhea, fever, and Rt flank pain (which turned into Group A Strep infection); sepsis with hypotension, afib with RVR; 02/11/23 fluid overload with plans for HD cath/HD  PMHx-afib, OA, obesity    PT Comments    Treatment limited by respiratory status and NP arrived to place HD catheter. HR 102-117 with transfer back to bed.   Follow Up Recommendations   (TBD when tolerates further activity (more medically stable))     Equipment Recommendations   (TBD)    Recommendations for Other Services       Precautions / Restrictions Precautions Precautions: Other (comment) Precaution Comments: multiple lines in ICU    Mobility  Bed Mobility Overal bed mobility: Needs Assistance;+ 2 for safety/equipment Bed Mobility: Sit to Supine       Sit to supine: Min assist;+2 for safety/equipment   General bed mobility comments: assist to raise legs onto bed  Transfers Overall transfer level: Needs assistance Equipment used: 1 person hand held assist Transfers: Sit to/from Stand Sit to Stand: Min assist;+2 safety/equipment         General transfer comment: from recliner; min steady assist due to slight dizziness  Ambulation/Gait Ambulation/Gait assistance: Min assist;+2 safety/equipment Ambulation Distance (Feet): 3 Feet Assistive device: 1 person hand held assist Gait Pattern/deviations: Step-to pattern;Wide base of support     General Gait Details: limited by respiratory status   Stairs            Wheelchair Mobility    Modified Rankin (Stroke Patients Only)       Balance Overall balance assessment: Needs assistance Sitting-balance support: No upper extremity supported;Feet supported Sitting balance-Leahy Scale: Fair     Standing balance support: Single extremity  supported Standing balance-Leahy Scale: Poor                      Cognition Arousal/Alertness: Awake/alert Behavior During Therapy: WFL for tasks assessed/performed Overall Cognitive Status: Within Functional Limits for tasks assessed                      Exercises      General Comments        Pertinent Vitals/Pain Pain Assessment: No/denies pain    Home Living                      Prior Function            PT Goals (current goals can now be found in the care plan section) Acute Rehab PT Goals Patient Stated Goal: regain strength and independence Time For Goal Achievement: 02/03/15 Progress towards PT goals: Progressing toward goals    Frequency  Min 3X/week    PT Plan Discharge plan needs to be updated    Co-evaluation             End of Session Equipment Utilized During Treatment: Oxygen Activity Tolerance: Treatment limited secondary to medical complications (Comment) (SaO2 decr to 87 on 6L [place]) Patient left: in bed;with call bell/phone within reach;with nursing/sitter in room     Time: 1353-1415 PT Time Calculation (min) (ACUTE ONLY): 22 min  Charges:  $Therapeutic Activity: 8-22 mins                    G Codes:      Amanda Wilkins 11-Feb-2015, 2:25  PM  Pager [phone]

## 2015-01-28 NOTE — Progress Notes (Signed)
Assessment:  1 Nonoliguric worsening AKI, hemodynamically mediated due to afib, hypotension, bacteremia, COX-2 inhib. Mild uremia 2 Group A strep 1/2 pos BC 3 Sepsis, improving 4 Acidosis, better 5 Hyponatremia, better 6 Hypokalemia  Plan:  1 Unfortunately she is volume overloaded and has some respiratory insufficiency and will probably need dialysis today.  Will give high dose furosemide trial first.  Subjective: Interval History: Increased SOB and orthopnea since yesterday.  CXR worse this AM.  Objective: Vital signs in last 24 hours: Temp:  [97.2 F (36.2 C)-97.8 F (36.6 C)] 97.4 F (36.3 C) (04/15 0700) Pulse Rate:  [25-142] 88 (04/15 0700) Resp:  [15-32] 18 (04/15 0700) BP: (98-167)/(54-121) 148/81 mmHg (04/15 0700) SpO2:  [84 %-100 %] 93 % (04/15 0700) FiO2 (%):  [30 %-55 %] 30 % (04/15 0600) Weight:  [114.1 kg (251 lb 8.7 oz)] 114.1 kg (251 lb 8.7 oz) (04/15 0500) Weight change: -0.5 kg (-1 lb 1.6 oz)  Intake/Output from previous day: 04/14 0701 - 04/15 0700 In: 3036.6 [P.O.:1110; I.V.:1726.6; IV Piggyback:200] Out: 1035 [Urine:1035] Intake/Output this shift:    General appearance: alert, cooperative and mild distress GI: soft, non-tender; bowel sounds normal; no masses,  no organomegaly and protuberant Extremities: edema 2+  anasarca  Lab Results:  Recent Labs  01/27/15 0420 01/28/15 0445  WBC 14.0* 13.1*  HGB 9.9* 9.4*  HCT 26.8* 26.0*  PLT 81* 105*   BMET:  Recent Labs  01/27/15 0420 01/28/15 0445  NA 128* 126*  K 3.6 3.2*  CL 94* 89*  CO2 19 21  GLUCOSE 123* 143*  BUN 83* 82*  CREATININE 5.00* 5.21*  CALCIUM 7.8* 7.9*   No results for input(s): PTH in the last 72 hours. Iron Studies: No results for input(s): IRON, TIBC, TRANSFERRIN, FERRITIN in the last 72 hours. Studies/Results: Nm Hepatobiliary Including Gb  01/26/2015   CLINICAL DATA:  Gallstones.  Distended gallbladder on prior CT.  EXAM: NUCLEAR MEDICINE HEPATOBILIARY IMAGING   TECHNIQUE: Sequential images of the abdomen were obtained out to 60 minutes following intravenous administration of radiopharmaceutical.  RADIOPHARMACEUTICALS:  5.0 [employer] OF-75Z Choletec  COMPARISON:  CT 01/24/2015  FINDINGS: Prompt uptake and excretion of radiotracer by the liver. Activity is seen in small bowel by 30 minutes. No activity noted in the gallbladder at 90 minutes. Therefore, 4 mg of morphine was administered intravenously. Continued imaging demonstrates filling of the gallbladder post morphine.  IMPRESSION: Delayed filling of the gallbladder, but the gallbladder fills after morphine administration compatible with patent cystic duct. No evidence of biliary obstruction.   Electronically Signed   By: Rolm Baptise M.D.   On: 01/26/2015 14:28   Dg Chest Port 1 View  01/27/2015   CLINICAL DATA:  69 year old female with sepsis, septic shock. Initial encounter.  EXAM: PORTABLE CHEST - 1 VIEW  COMPARISON:  01/24/2015 and earlier.  FINDINGS: Portable AP upright view at 0802 hours. Stable left PICC line. Mildly improved lung volumes and bibasilar ventilation. Bilateral increased interstitial markings have regressed and now appear at baseline compared to [place] stands. No pneumothorax. No pleural effusion or consolidation. Stable cardiac size and mediastinal contours.  IMPRESSION: Regressed bilateral interstitial opacity, lung parenchyma now all appears at baseline compared to 2015 studies. No new cardiopulmonary abnormality.   Electronically Signed   By: Genevie Ann M.D.   On: 01/27/2015 08:08   Scheduled: . antiseptic oral rinse  7 mL Mouth Rinse q12n4p  . aspirin  325 mg Oral Daily  . cefTRIAXone (ROCEPHIN)  IV  2 g Intravenous Q24H  . chlorhexidine  15 mL Mouth Rinse BID  . chlorpheniramine-HYDROcodone  5 mL Oral Q12H  . clindamycin (CLEOCIN) IV  900 mg Intravenous 3 times per day  . estrogens (conjugated)  0.3 mg Oral Q48H  . famotidine  20 mg Oral QHS  . furosemide  200 mg Intravenous Once   . heparin  5,000 Units Subcutaneous 3 times per day  . ipratropium-albuterol  3 mL Nebulization Q6H  . LORazepam  1 mg Oral Once  . potassium chloride  40 mEq Oral Once  . sertraline  50 mg Oral Daily  . sodium chloride  3 mL Intravenous Q12H     LOS: 5 days   Sumire Halbleib C 01/28/2015,8:28 AM

## 2015-01-28 NOTE — Progress Notes (Addendum)
Patient experienced a coughing spell while on CPAP then O2 sat decreased to mid-to-low 80s.  Patient was anxious and stated she needed the CPAP off.  CPAP was removed, HOB was increased from 30 to 60 degrees and RT gave a PRN xopenex.  For the duration of the treatment O2 sats maintained in mid 80s. Patient was placed on a non-rebreather and her O2 sats quickly rose to 100%.  The flap was removed from the non-rebreather, sats remained 100% so the patient was then placed on a venti mask at 50%.  O2 sats remain >95% on 50% venti.  Will continue to monitor patient carefully.

## 2015-01-28 NOTE — Progress Notes (Signed)
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 but now scabbing  Antibiotics:  Anti-infectives    Start     Dose/Rate Route Frequency Ordered Stop   01/26/15 1530  cefTRIAXone (ROCEPHIN) 2 g in dextrose 5 % 50 mL IVPB - Premix     2 g 100 mL/hr over 30 Minutes Intravenous Every 24 hours 01/26/15 1505     01/25/15 1200  vancomycin (VANCOCIN) 1,500 mg in sodium chloride 0.9 % 500 mL IVPB  Status:  Discontinued     1,500 mg 250 mL/hr over 120 Minutes Intravenous Every 48 hours 01/23/15 1417 01/25/15 1015   01/25/15 1100  clindamycin (CLEOCIN) IVPB 900 mg     900 mg 100 mL/hr over 30 Minutes Intravenous 3 times per day 01/25/15 1014     01/24/15 1600  doxycycline (VIBRAMYCIN) 100 mg in dextrose 5 % 250 mL IVPB  Status:  Discontinued     100 mg 125 mL/hr over 120 Minutes Intravenous Every 12 hours 01/24/15 1500 01/25/15 1013   01/24/15 1400  piperacillin-tazobactam (ZOSYN) IVPB 2.25 g  Status:  Discontinued     2.25 g 100 mL/hr over 30 Minutes Intravenous 4 times per day 01/24/15 1148 01/26/15 1505   01/24/15 1000  vancomycin (VANCOCIN) IVPB 1000 mg/200 mL premix  Status:  Discontinued     1,000 mg 200 mL/hr over 60 Minutes Intravenous Every 24 hours 01/23/15 0846 01/23/15 1417   01/23/15 2200  piperacillin-tazobactam (ZOSYN) IVPB 3.375 g  Status:  Discontinued     3.375 g 12.5 mL/hr over 240 Minutes Intravenous Every 8 hours 01/23/15 0846 01/24/15 1148   01/23/15 1000  oseltamivir (TAMIFLU) capsule 30 mg  Status:  Discontinued     30 mg Oral 2 times daily 01/23/15 0803 01/23/15 1413   01/23/15 0900  vancomycin (VANCOCIN) 2,000 mg in sodium chloride 0.9 % 500 mL IVPB     2,000 mg 250 mL/hr over 120 Minutes Intravenous  Once 01/23/15 0846 01/23/15 1401   01/23/15 0845  piperacillin-tazobactam (ZOSYN) IVPB 3.375 g     3.375 g 100 mL/hr over 30 Minutes Intravenous  Once 01/23/15 0846  01/23/15 2013      Medications: Scheduled Meds: . antiseptic oral rinse  7 mL Mouth Rinse q12n4p  . aspirin  325 mg Oral Daily  . cefTRIAXone (ROCEPHIN)  IV  2 g Intravenous Q24H  . chlorhexidine  15 mL Mouth Rinse BID  . chlorpheniramine-HYDROcodone  5 mL Oral Q12H  . clindamycin (CLEOCIN) IV  900 mg Intravenous 3 times per day  . estrogens (conjugated)  0.3 mg Oral Q48H  . famotidine  20 mg Oral QHS  . heparin  5,000 Units Subcutaneous 3 times per day  . ipratropium-albuterol  3 mL Nebulization Q6H  . LORazepam  1 mg Oral Once  . sertraline  50 mg Oral Daily  . sodium chloride  3 mL Intravenous Q12H   Continuous Infusions: . diltiazem (CARDIZEM) infusion 10 mg/hr (01/28/15 1100)  .  sodium bicarbonate  infusion 1000 mL 10 mL/hr at 01/28/15 0800   PRN Meds:.sodium chloride, sodium chloride, acetaminophen **OR** acetaminophen, alteplase, feeding supplement (NEPRO CARB STEADY), guaifenesin, heparin, heparin, HYDROcodone-homatropine, levalbuterol, lidocaine (PF), lidocaine-prilocaine, loperamide, ondansetron **OR** ondansetron (ZOFRAN) IV, pentafluoroprop-tetrafluoroeth, sodium chloride    Objective: Weight change: -1 lb 1.6 oz (-0.5 kg)  Intake/Output Summary (Last 24 hours) at 01/28/15 1952 Last data  filed at 01/28/15 1800  Gross per 24 hour  Intake   2190 ml  Output   2125 ml  Net     65 ml   Blood pressure 122/65, pulse 90, temperature 97.7 F (36.5 C), temperature source Oral, resp. rate 18, height 5\' 6"  (1.676 m), weight 251 lb 8.7 oz (114.1 kg), SpO2 95 %. Temp:  [97.3 F (36.3 C)-97.7 F (36.5 C)] 97.7 F (36.5 C) (04/15 1700) Pulse Rate:  [25-142] 90 (04/15 1915) Resp:  [13-26] 18 (04/15 1915) BP: (107-172)/(48-121) 122/65 mmHg (04/15 1915) SpO2:  [87 %-100 %] 95 % (04/15 1800) FiO2 (%):  [30 %-55 %] 35 % (04/15 1200) Weight:  [251 lb 8.7 oz (114.1 kg)] 251 lb 8.7 oz (114.1 kg) (04/15 0500)  Physical Exam: General: Alert and awake, preparing for HD HEENT:  anicteric sclera, pupils reactive to light and accommodation, EOMI  ENT:" strawberry tongue" and HSV lesions on lips       CVS tachy rate, irr, irr  no murmur rubs or gallops Chest: clear to auscultation bilaterally, no wheezing, rales or rhonchi Abdomen: soft TTP in RUQ, no rebound  Lymph/skin new progressive almost bullous cellulitis extending down flank (which is NOT where she initially presented with pain   Left axilla, breast 01/24/15    01/26/15:      Flank 01/26/15:     01/27/15:      01/28/15:         Back 01/27/15:    01/28/15:      Neuro: nonfocal  CBC:  CBC Latest Ref Rng 01/28/2015 01/27/2015 01/26/2015  WBC 4.0 - 10.5 K/uL 13.1(H) 14.0(H) 12.5(H)  Hemoglobin 12.0 - 15.0 g/dL 9.4(L) 9.9(L) 10.5(L)  Hematocrit 36.0 - 46.0 % 26.0(L) 26.8(L) 28.6(L)  Platelets 150 - 400 K/uL 105(L) 81(L) 67(L)       BMET  Recent Labs  01/27/15 0420 01/28/15 0445  NA 128* 126*  K 3.6 3.2*  CL 94* 89*  CO2 19 21  GLUCOSE 123* 143*  BUN 83* 82*  CREATININE 5.00* 5.21*  CALCIUM 7.8* 7.9*     Liver Panel  No results for input(s): PROT, ALBUMIN, AST, ALT, ALKPHOS, BILITOT, BILIDIR, IBILI in the last 72 hours.     Sedimentation Rate No results for input(s): ESRSEDRATE in the last 72 hours. C-Reactive Protein No results for input(s): CRP in the last 72 hours.  Micro Results: Recent Results (from the past 720 hour(s))  Culture, blood (routine x 2)     Status: None   Collection Time: 01/23/15 12:23 PM  Result Value Ref Range Status   Specimen Description BLOOD RIGHT HAND  Final   Special Requests BOTTLES DRAWN AEROBIC ONLY 4CC  Final   Culture   Final    GROUP A STREP (S.PYOGENES) ISOLATED Note: CRITICAL RESULT CALLED TO, READ BACK BY AND VERIFIED WITH: CHRISTINE HINSHAW @ [phone] ON [id number] BY [place] FAXED TO [facility] TO [facility] 01/27/15 BY LYONK Note: Gram Stain Report Called to,Read Back By and Verified With: Sherri Rad RN on  01/24/15 at 06:55 by Rise Mu Performed at [facility]    Report Status 01/28/2015 FINAL  Final  Culture, blood (routine x 2)     Status: None (Preliminary result)   Collection Time: 01/23/15 12:30 PM  Result Value Ref Range Status   Specimen Description BLOOD LEFT HAND  Final   Special Requests BOTTLES DRAWN AEROBIC ONLY 5CC  Final   Culture   Final  BLOOD CULTURE RECEIVED NO GROWTH TO DATE CULTURE WILL BE HELD FOR 5 DAYS BEFORE ISSUING A FINAL NEGATIVE REPORT Performed at [facility]    Report Status PENDING  Incomplete  Clostridium Difficile by PCR     Status: None   Collection Time: 01/24/15 12:59 PM  Result Value Ref Range Status   C difficile by pcr NEGATIVE NEGATIVE Final  Culture, Urine     Status: None   Collection Time: 01/24/15  2:02 PM  Result Value Ref Range Status   Specimen Description URINE, CATHETERIZED  Final   Special Requests NONE  Final   Colony Count NO GROWTH Performed at [facility]   Final   Culture NO GROWTH Performed at [facility]   Final   Report Status 01/26/2015 FINAL  Final  MRSA PCR Screening     Status: None   Collection Time: 01/24/15  4:30 PM  Result Value Ref Range Status   MRSA by PCR NEGATIVE NEGATIVE Final    Comment:        The GeneXpert MRSA Assay (FDA approved for NASAL specimens only), is one component of a comprehensive MRSA colonization surveillance program. It is not intended to diagnose MRSA infection nor to guide or monitor treatment for MRSA infections.     Studies/Results: Dg Chest Port 1 View  01/28/2015   CLINICAL DATA:  Central line placement.  EXAM: PORTABLE CHEST - 1 VIEW  COMPARISON:  01/28/2015.  FINDINGS: The right IJ central venous catheter tip is in the proximal SVC above the level of the carina. No complicating features are demonstrated. Persistent bibasilar atelectasis. Possible small right effusion.  IMPRESSION: Right IJ central venous catheter tip is in the  proximal SVC. No complicating features.  Persistent bibasilar atelectasis and possible small right effusion.   Electronically Signed   By: Marijo Sanes M.D.   On: 01/28/2015 15:25   Dg Chest Port 1 View  01/28/2015   CLINICAL DATA:  Acute respiratory failure.  EXAM: PORTABLE CHEST - 1 VIEW  COMPARISON:  January 27, 2015.  FINDINGS: Stable cardiomediastinal silhouette. No pneumothorax is noted. Left lung is clear. New right basilar opacity is noted concerning for pneumonia or atelectasis, with possible associated pleural effusion. Left-sided PICC line is noted with distal tip overlying expected position of the SVC.  IMPRESSION: New right basilar opacity is noted concerning for pneumonia or atelectasis with associated pleural effusion. Followup radiographs are recommended.   Electronically Signed   By: Marijo Conception, M.D.   On: 01/28/2015 08:26   Dg Chest Port 1 View  01/27/2015   CLINICAL DATA:  69 year old female with sepsis, septic shock. Initial encounter.  EXAM: PORTABLE CHEST - 1 VIEW  COMPARISON:  01/24/2015 and earlier.  FINDINGS: Portable AP upright view at 0802 hours. Stable left PICC line. Mildly improved lung volumes and bibasilar ventilation. Bilateral increased interstitial markings have regressed and now appear at baseline compared to [place] stands. No pneumothorax. No pleural effusion or consolidation. Stable cardiac size and mediastinal contours.  IMPRESSION: Regressed bilateral interstitial opacity, lung parenchyma now all appears at baseline compared to 2015 studies. No new cardiopulmonary abnormality.   Electronically Signed   By: Genevie Ann M.D.   On: 01/27/2015 08:08      Assessment/Plan:  Principal Problem:   Atrial fibrillation with RVR Active Problems:   Palpitations   Upper respiratory disease   Dehydration   Breast pain, right   A-fib   Fever   Nausea & vomiting  Sepsis   Acute renal insufficiency   Abdominal pain   Axillary pain   Dyspnea   Hypoxia    Thrombocytopenia   Transaminitis   Hyperbilirubinemia   FUO (fever of unknown origin)   Septic shock   AKI (acute kidney injury)   Cholecystitis   DIC (disseminated intravascular coagulation)   Group A streptococcal infection   Cellulitis   D-dimer, elevated   Severe sepsis with septic shock    Ivett Luebbe is a 69 y.o. female with fevers, axillary LN pain, breast pain ARF, TTpenia, yesterday with apparent clinical worsening on vancomycin and zosyn with worsenign renal fxn, tachypnea, TTPenia --> ICU. She has coag profile c/w more chronic DIC. NOW after visiting her she is found to have GROUP A strep in one of TWO blood cultures   #1 Severe Sepsis with shock due to GAS. She has new area on back,, potentially toxin mediated esp with manifestations of "strawberry tongue" and newer lesion on back   I had  ordered MRI chest, abdomen to look for abscess, evidence of fascitis or something that would require surgery but pt required HD placement and HD today so not yet done  Son and Dr. Joya Gaskins had made me aware of grandchildren who had impetigo that was likely source of pts severe GAS  ceftriaxone once daily for a bactericidal beta-lactam for group A strep Continue clindamycin for inhibition of ribosomal production of toxins  #2 Shoulder pain Better today but will get MRI of shoulder during this admission not today given priority of looking at abdomen flank, chest  #3 Gallstones: sp HIDA, biliary system does not seen to be the nidus of her infection now that she is had clear-cut declaration of her group A strep bacteremia and cellulitis soft tissue infection   #4 thrombocytopenia: Likely from DIC and improving  #5  renal failure multifactorial with sepsis NSAIDs and vancomycin the role of the picture , now on HD  #6 Respiratory failuire due to volume overload and need for HD  #7 HSV1 eruption: could consider adding valtrex but no strong indication and trying to avoid  nephrotoxins      LOS: 5 days   Alcide Evener 01/28/2015, 7:52 PM

## 2015-01-28 NOTE — Progress Notes (Signed)
Patient Name: Amanda Wilkins Date of Encounter: 01/28/2015   SUBJECTIVE:  Up in chair, less coughing so far today.  CURRENT MEDS . antiseptic oral rinse  7 mL Mouth Rinse q12n4p  . aspirin  325 mg Oral Daily  . cefTRIAXone (ROCEPHIN)  IV  2 g Intravenous Q24H  . chlorhexidine  15 mL Mouth Rinse BID  . chlorpheniramine-HYDROcodone  5 mL Oral Q12H  . clindamycin (CLEOCIN) IV  900 mg Intravenous 3 times per day  . estrogens (conjugated)  0.3 mg Oral Q48H  . famotidine  20 mg Oral QHS  . heparin  5,000 Units Subcutaneous 3 times per day  . ipratropium-albuterol  3 mL Nebulization Q6H  . LORazepam  1 mg Oral Once  . sertraline  50 mg Oral Daily  . sodium chloride  3 mL Intravenous Q12H    OBJECTIVE  Filed Vitals:   01/28/15 0902 01/28/15 1000 01/28/15 1030 01/28/15 1100  BP:  172/86 135/76 147/86  Pulse:  85 95 43  Temp:      TempSrc:      Resp:  16 21 16   Height:      Weight:      SpO2: 92% 93% 97% 93%    Intake/Output Summary (Last 24 hours) at 01/28/15 1132 Last data filed at 01/28/15 1100  Gross per 24 hour  Intake 2636.58 ml  Output   1325 ml  Net 1311.58 ml   Filed Weights   01/26/15 0600 01/27/15 0600 01/28/15 0500  Weight: 247 lb 9.2 oz (112.3 kg) 252 lb 10.4 oz (114.6 kg) 251 lb 8.7 oz (114.1 kg)    PHYSICAL EXAM: Gen: Obese female, mild distress- coughing Chest: decreased breath sounds, no wheezing Cardiac: Irreg Irreg rhythm Extrem: 1+ edema  Accessory Clinical Findings  CBC  Recent Labs  01/27/15 0420 01/28/15 0445  WBC 14.0* 13.1*  HGB 9.9* 9.4*  HCT 26.8* 26.0*  MCV 90.8 92.2  PLT 81* 902*   Basic Metabolic Panel  Recent Labs  01/26/15 0400 01/27/15 0420 01/28/15 0445  NA 126* 128* 126*  K 3.0* 3.6 3.2*  CL 94* 94* 89*  CO2 15* 19 21  GLUCOSE 111* 123* 143*  BUN 79* 83* 82*  CREATININE 4.66* 5.00* 5.21*  CALCIUM 7.4* 7.8* 7.9*  MG 2.4 2.6*  --   PHOS 5.6* 5.3*  --    Liver Function Tests No results for input(s):  AST, ALT, ALKPHOS, BILITOT, PROT, ALBUMIN in the last 72 hours. No results for input(s): LIPASE, AMYLASE in the last 72 hours. Cardiac Enzymes No results for input(s): CKTOTAL, CKMB, CKMBINDEX, TROPONINI in the last 72 hours. BNP Invalid input(s): POCBNP D-Dimer No results for input(s): DDIMER in the last 72 hours. Hemoglobin A1C No results for input(s): HGBA1C in the last 72 hours. Fasting Lipid Panel No results for input(s): CHOL, HDL, LDLCALC, TRIG, CHOLHDL, LDLDIRECT in the last 72 hours. Thyroid Function Tests No results for input(s): TSH, T4TOTAL, T3FREE, THYROIDAB in the last 72 hours.  Invalid input(s): FREET3  TELE Atrial fibrillation with VR 100-120  Echo: 01/25/15 Study Conclusions  - Left ventricle: The cavity size was normal. Wall thickness was normal. Systolic function was normal. The estimated ejection fraction was in the range of 55% to 60%. Wall motion was normal; there were no regional wall motion abnormalities. - Mitral valve: Calcified annulus. There was mild regurgitation. - Left atrium: The atrium was mildly dilated. - Pulmonary arteries: Systolic pressure was mildly increased. PA peak pressure: 32 mm Hg (S).  Impressions: - Normal LV function; mild LAE; mild MR and TR; mildly elevated pulmonary pressure.   ASSESSMENT AND PLAN  1. Paroxysmal atrial fibrillation. She is in AF with fast VR but probably not inappropriate as sick as she is, on ASA 325 and heparin SQ DVT prophylaxis.  Echocardiogram  shows good left ventricular systolic function with ejection fraction 55-60%. There were no wall motion abnormalities.  2. Cholelithiasis.Apparently this is not a surgical problem at this time 3. Thrombocytopenia 4. Cellulitis or erysipelas of abdominal right lateral wall. 5. Septic shock 6. Acute renal failure- SCr up to 5.2-    Plan: She was in shock earlier this admission, now off pressors and B/P is controlled. HR is fast but stable, worse  when coughing, she is tolerating IV Diltiazem.   ? Is she a candidate for systemic anticoagulation- CHADSVASC= 2 for age and female.   Her I/O is positive 9.2 L. and SCr 5.2. She received 200 mg of Lasix this am. She may need dialysis.  Henri Medal PA Agree with above assessment. Her heart rate has improved on diltiazem drip at 10 mg/hr. BP tolerating diltiazem.  She will probably require hemodialysis later today. For her atrial fibrillation I would favor starting full anticoagulation with heparin once all dialysis catheters are in place etc. Her platelet count is now up to 105K. Would stop ASA once she is on full anticoagulation.

## 2015-01-28 NOTE — Progress Notes (Signed)
Pt wore cpap about a hour  And had to come off due to coughing episode and desaturation.  Gave her a PRN Xop nebulizer treatment.  Placed on a NRB until SPO2 got back up to normal range.  Now on 50% Venti mask tolerating well.

## 2015-01-28 NOTE — Progress Notes (Addendum)
[facility] TEAM 1 - Stepdown/ICU TEAM Progress Note  Amanda Wilkins EPP:[id number] DOB: December 30, 1945 DOA: 01/22/2015 PCP: Sonda Primes, MD  Admit HPI / Brief Narrative: 69 yo F w/ h/o pafib and htn who presented w/ 2 days of n/v, general malaise, diarrhea, and fever last. When she started having palpitations she came to ED. She had vomited several times and was unable to hold down her home meds.  Significant Events: 4/09 Admit with N/V/D - lactic acid 3.6 4/10 CT Chest > inflammation in R axilla extending inferiorly along R chest, no focal evidence of abscess or gas, mild dependent atx 4/11 CT Abd/Pelvis > no renal obs, Gallbladder distention with possible internal gallstone identified 4/11 Renal US > no hydronephrosis, cholelithiasis, small L pleural effusion  4/11 PCCM consulted for sepsis. Lactic acid 1.4 4/12 Hypotensive on dilt gtt, tachy on dopamine gtt, some resp distress with fluids 4/12 LE Duplex > neg for DVT 4/12 RUE Duplex > neg for DVT 4/13 Off pressors 4/13 HIDA Scan > delayed filling of gallbladder, but the gallbladder fills after morphine administration, no evidence of biliary obstruction 4/14 Desaturated into mid 80's on RA while sleeping  HPI/Subjective: The patient states she "feels terrible in general" but she has no specific complaints today.  She admits that she does feel better than when she first was admitted.  She denies current chest pain nausea vomiting or abdominal pain.  Assessment/Plan:  Septic shock due to Group A strep cellulitis / chest wall induration  ID following - MRI of abdomen and chest ordered per ID to rule out occult abscess  Probable Viral Gastroenteritis  Suspect her initial GI symptoms were likely due to her sepsis/severe cellulitis - symptoms improved at present  Persistent dependent Atelectasis   Acute pulm edema medical management has been difficult due to acute kidney failure - Nephrology plans to initiate  hemodialysis   Persistent Atrial Fibrillation  Cardiology is following - rate currently reasonably controlled - plan to begin anticoagulation with IV heparin once immediate procedures completed  Acute Kidney Injury  hemodynamically mediated due to afib, hypotension, bacteremia, COX-2 inhib - hemodialysis catheter being placed today to initiate dialysis per nephrology   Hyponatremia due to kidney failure - should correct with dialysis  Thrombocytopenia Probable DIC - platelet count climbing and now greater than 100   Hyperglycemia previous A1c's have been normal - will recheck this admission  Obesity - Body mass index is 40.62 kg/(m^2).  High D-dimer doubt PE, will not scan given renal function - venous duplex neg for DVT  Suspected OSA noted desaturations to 84% overnight 4/14 - cont nocturnal CPAP   Code Status: FULL Family Communication: no family present at time of exam Disposition Plan: SDU per PCCM  Consultants: Cardiology  ID Nephrology   Antibiotics: Ceftriaxone 4/13 > Clindamycin 4/12 >  DVT prophylaxis: Subcutaneous heparin  Objective: Blood pressure 148/81, pulse 88, temperature 97.4 F (36.3 C), temperature source Oral, resp. rate 18, height 5\' 6"  (1.676 m), weight 114.1 kg (251 lb 8.7 oz), SpO2 93 %.  Intake/Output Summary (Last 24 hours) at 01/28/15 0846 Last data filed at 01/28/15 0700  Gross per 24 hour  Intake 2736.58 ml  Output   1035 ml  Net 1701.58 ml   Exam: General: No acute respiratory distress upright in bedside chair  Lungs: poor air movement throughout all fields with bibasilar crackles but no wheeze  Cardiovascular: distant heart sounds, regular rate, no appreciable murmur  Abdomen: Nontender, obese, soft, bowel sounds positive, no  rebound, no ascites, no appreciable mass Extremities: No significant cyanosis, clubbing; 1+ edema bilateral lower extremities   Data Reviewed: Basic Metabolic Panel:  Recent Labs Lab 01/22/15 1750   01/24/15 0500 01/25/15 0500 01/26/15 0400 01/27/15 0420 01/28/15 0445  NA 127*  < > 125* 125* 126* 128* 126*  K 3.8  < > 3.8 3.8 3.0* 3.6 3.2*  CL 94*  < > 100 97 94* 94* 89*  CO2 16*  < > 18* 13* 15* 19 21  GLUCOSE 156*  < > 107* 92 111* 123* 143*  BUN 32*  < > 52* 66* 79* 83* 82*  CREATININE 1.97*  < > 3.25* 4.06* 4.66* 5.00* 5.21*  CALCIUM 8.6  < > 7.0* 7.2* 7.4* 7.8* 7.9*  MG 1.8  --   --   --  2.4 2.6*  --   PHOS 3.3  --   --   --  5.6* 5.3*  --   < > = values in this interval not displayed.  Liver Function Tests:  Recent Labs Lab 01/24/15 0500 01/25/15 0500  AST 54* 38*  ALT 39* 37*  ALKPHOS 38* 41  BILITOT 1.4* 1.2  PROT 5.0* 5.0*  ALBUMIN 2.2* 2.0*    Coags:  Recent Labs Lab 01/24/15 2043  INR 1.18    Recent Labs Lab 01/24/15 2043  APTT 35    CBC:  Recent Labs Lab 01/24/15 2043 01/25/15 0500 01/26/15 0400 01/27/15 0420 01/28/15 0445  WBC 9.0 12.2* 12.5* 14.0* 13.1*  NEUTROABS 8.0*  --   --   --   --   HGB 11.6* 11.7* 10.5* 9.9* 9.4*  HCT 32.4* 32.9* 28.6* 26.8* 26.0*  MCV 94.5 94.0 92.9 90.8 92.2  PLT 73*  74* 80* 67* 81* 105*    Cardiac Enzymes:  Recent Labs Lab 01/23/15 1050 01/23/15 1230  TROPONINI 0.03 <0.03    CBG:  Recent Labs Lab 01/24/15 2237 01/26/15 1607  GLUCAP 89 128*    Recent Results (from the past 240 hour(s))  Culture, blood (routine x 2)     Status: None   Collection Time: 01/23/15 12:23 PM  Result Value Ref Range Status   Specimen Description BLOOD RIGHT HAND  Final   Special Requests BOTTLES DRAWN AEROBIC ONLY 4CC  Final   Culture   Final    GROUP A STREP (S.PYOGENES) ISOLATED Note: CRITICAL RESULT CALLED TO, READ BACK BY AND VERIFIED WITH: CHRISTINE HINSHAW @ 0935 ON [id number] BY [facility] FAXED TO [facility] TO [facility] 01/27/15 BY LYONK Note: Gram Stain Report Called to,Read Back By and Verified With: Murlean Hark RN on 01/24/15 at 06:55 by Christie Nottingham Performed at [facility]    Report  Status 01/28/2015 FINAL  Final  Culture, blood (routine x 2)     Status: None (Preliminary result)   Collection Time: 01/23/15 12:30 PM  Result Value Ref Range Status   Specimen Description BLOOD LEFT HAND  Final   Special Requests BOTTLES DRAWN AEROBIC ONLY 5CC  Final   Culture   Final           BLOOD CULTURE RECEIVED NO GROWTH TO DATE CULTURE WILL BE HELD FOR 5 DAYS BEFORE ISSUING A FINAL NEGATIVE REPORT Performed at [facility]    Report Status PENDING  Incomplete  Clostridium Difficile by PCR     Status: None   Collection Time: 01/24/15 12:59 PM  Result Value Ref Range Status   C difficile by pcr NEGATIVE NEGATIVE Final  Culture, Urine     Status: None   Collection Time: 01/24/15  2:02 PM  Result Value Ref Range Status   Specimen Description URINE, CATHETERIZED  Final   Special Requests NONE  Final   Colony Count NO GROWTH Performed at [facility]   Final   Culture NO GROWTH Performed at [facility]   Final   Report Status 01/26/2015 FINAL  Final  MRSA PCR Screening     Status: None   Collection Time: 01/24/15  4:30 PM  Result Value Ref Range Status   MRSA by PCR NEGATIVE NEGATIVE Final    Comment:        The GeneXpert MRSA Assay (FDA approved for NASAL specimens only), is one component of a comprehensive MRSA colonization surveillance program. It is not intended to diagnose MRSA infection nor to guide or monitor treatment for MRSA infections.     Studies:   Recent x-ray studies have been reviewed in detail by the Attending Physician  Scheduled Meds:  Scheduled Meds: . antiseptic oral rinse  7 mL Mouth Rinse q12n4p  . aspirin  325 mg Oral Daily  . cefTRIAXone (ROCEPHIN)  IV  2 g Intravenous Q24H  . chlorhexidine  15 mL Mouth Rinse BID  . chlorpheniramine-HYDROcodone  5 mL Oral Q12H  . clindamycin (CLEOCIN) IV  900 mg Intravenous 3 times per day  . estrogens (conjugated)  0.3 mg Oral Q48H  . famotidine  20 mg Oral QHS  .  furosemide  200 mg Intravenous Once  . heparin  5,000 Units Subcutaneous 3 times per day  . ipratropium-albuterol  3 mL Nebulization Q6H  . LORazepam  1 mg Oral Once  . potassium chloride  40 mEq Oral Once  . potassium chloride  40 mEq Oral Once  . sertraline  50 mg Oral Daily  . sodium chloride  3 mL Intravenous Q12H    Time spent on care of this patient: 35 mins   Ernst Cumpston T , MD   [facility] Office  [phone] Pager - Text Page per Loretha Stapler as per below:  On-Call/Text Page:      [email or web address]      password [email or web address]  If 7PM-7AM, please contact night-coverage [email or web address] Password [email or web address] 01/28/2015, 8:46 AM   LOS: 5 days

## 2015-01-28 NOTE — Progress Notes (Signed)
Report called to Justice Rocher, [place] RN. Pt currently finishing up HD treatment, will be completed at [facility]. VS stable. No questions or complaints at this time.  Amanda Wilkins

## 2015-01-28 NOTE — Progress Notes (Signed)
Patient ID: Amanda Wilkins, female   DOB: 1945-12-19, 69 y.o.   MRN: [id number]     [facility]      [address] [place], [place] [address]    Phone: [phone] FAX: [phone]     Subjective: Tolerating fulls.  Denies abdominal pain  Objective:  Vital signs:  Filed Vitals:   01/28/15 0600 01/28/15 0630 01/28/15 0700 01/28/15 0900  BP: 124/71 131/82 148/81   Pulse: 120 99 88   Temp:   97.4 F (36.3 C)   TempSrc:   Oral   Resp: 18 18 18    Height:      Weight:      SpO2: 95% 93% 93% 94%    Last BM Date: 01/23/15  Intake/Output   Yesterday:  04/14 0701 - 04/15 0700 In: 3036.6 [P.O.:1110; I.V.:1726.6; IV Piggyback:200] Out: 1035 [Urine:1035] This shift: I/O last 3 completed shifts: In: 4006.6 [P.O.:1210; I.V.:2496.6; IV Piggyback:300] Out: 1435 [Urine:1435] Total I/O In: 170 [I.V.:100; IV Piggyback:70] Out: -   Gen: Alert, NAD, pleasant Card: RRR, no M/G/R heard Pulm: CTA, no W/R/R Abd: Obese, soft, mild distension, non-tender today, +BS, no HSM Skin: Large indurated patch on her right flank/side extending from under right breast to right groin, blistering lesions    Problem List:   Principal Problem:   Atrial fibrillation with RVR Active Problems:   Palpitations   Upper respiratory disease   Dehydration   Breast pain, right   A-fib   Fever   Nausea & vomiting   Sepsis   Acute renal insufficiency   Abdominal pain   Axillary pain   Dyspnea   Hypoxia   Thrombocytopenia   Transaminitis   Hyperbilirubinemia   FUO (fever of unknown origin)   Septic shock   AKI (acute kidney injury)   Cholecystitis   DIC (disseminated intravascular coagulation)   Group A streptococcal infection   Cellulitis   D-dimer, elevated   Severe sepsis with septic shock    Results:   Labs: Results for orders placed or performed during the hospital encounter of 01/22/15 (from the past 48 hour(s))  Glucose,  capillary     Status: Abnormal   Collection Time: 01/26/15  4:07 PM  Result Value Ref Range   Glucose-Capillary 128 (H) 70 - 99 mg/dL   Comment 1 Notify RN   CBC     Status: Abnormal   Collection Time: 01/27/15  4:20 AM  Result Value Ref Range   WBC 14.0 (H) 4.0 - 10.5 K/uL   RBC 2.95 (L) 3.87 - 5.11 MIL/uL   Hemoglobin 9.9 (L) 12.0 - 15.0 g/dL   HCT 26.8 (L) 36.0 - 46.0 %   MCV 90.8 78.0 - 100.0 fL   MCH 33.6 26.0 - 34.0 pg   MCHC 36.9 (H) 30.0 - 36.0 g/dL   RDW 13.6 11.5 - 15.5 %   Platelets 81 (L) 150 - 400 K/uL    Comment: CONSISTENT WITH PREVIOUS RESULT  Basic metabolic panel     Status: Abnormal   Collection Time: 01/27/15  4:20 AM  Result Value Ref Range   Sodium 128 (L) 135 - 145 mmol/L   Potassium 3.6 3.5 - 5.1 mmol/L   Chloride 94 (L) 96 - 112 mmol/L   CO2 19 19 - 32 mmol/L   Glucose, Bld 123 (H) 70 - 99 mg/dL   BUN 83 (H) 6 - 23 mg/dL   Creatinine, Ser 5.00 (H) 0.50 - 1.10 mg/dL  Calcium 7.8 (L) 8.4 - 10.5 mg/dL   GFR calc non Af Amer 8 (L) >90 mL/min   GFR calc Af Amer 9 (L) >90 mL/min    Comment: (NOTE) The eGFR has been calculated using the CKD EPI equation. This calculation has not been validated in all clinical situations. eGFR's persistently <90 mL/min signify possible Chronic Kidney Disease.    Anion gap 15 5 - 15  Magnesium     Status: Abnormal   Collection Time: 01/27/15  4:20 AM  Result Value Ref Range   Magnesium 2.6 (H) 1.5 - 2.5 mg/dL  Phosphorus     Status: Abnormal   Collection Time: 01/27/15  4:20 AM  Result Value Ref Range   Phosphorus 5.3 (H) 2.3 - 4.6 mg/dL  Procalcitonin     Status: None   Collection Time: 01/28/15  4:45 AM  Result Value Ref Range   Procalcitonin 14.72 ng/mL    Comment:        Interpretation: PCT >= 10 ng/mL: Important systemic inflammatory response, almost exclusively due to severe bacterial sepsis or septic shock. (NOTE)         ICU PCT Algorithm               Non ICU PCT Algorithm     ----------------------------     ------------------------------         PCT < 0.25 ng/mL                 PCT < 0.1 ng/mL     Stopping of antibiotics            Stopping of antibiotics       strongly encouraged.               strongly encouraged.    ----------------------------     ------------------------------       PCT level decrease by               PCT < 0.25 ng/mL       >= 80% from peak PCT       OR PCT 0.25 - 0.5 ng/mL          Stopping of antibiotics                                             encouraged.     Stopping of antibiotics           encouraged.    ----------------------------     ------------------------------       PCT level decrease by              PCT >= 0.25 ng/mL       < 80% from peak PCT        AND PCT >= 0.5 ng/mL             Continuing antibiotics                                              encouraged.       Continuing antibiotics            encouraged.    ----------------------------     ------------------------------     PCT level increase compared  PCT > 0.5 ng/mL         with peak PCT AND          PCT >= 0.5 ng/mL             Escalation of antibiotics                                          strongly encouraged.      Escalation of antibiotics        strongly encouraged.   Basic metabolic panel     Status: Abnormal   Collection Time: 01/28/15  4:45 AM  Result Value Ref Range   Sodium 126 (L) 135 - 145 mmol/L   Potassium 3.2 (L) 3.5 - 5.1 mmol/L   Chloride 89 (L) 96 - 112 mmol/L   CO2 21 19 - 32 mmol/L   Glucose, Bld 143 (H) 70 - 99 mg/dL   BUN 82 (H) 6 - 23 mg/dL   Creatinine, Ser 5.21 (H) 0.50 - 1.10 mg/dL   Calcium 7.9 (L) 8.4 - 10.5 mg/dL   GFR calc non Af Amer 8 (L) >90 mL/min   GFR calc Af Amer 9 (L) >90 mL/min    Comment: (NOTE) The eGFR has been calculated using the CKD EPI equation. This calculation has not been validated in all clinical situations. eGFR's persistently <90 mL/min signify possible Chronic Kidney Disease.     Anion gap 16 (H) 5 - 15  CBC     Status: Abnormal   Collection Time: 01/28/15  4:45 AM  Result Value Ref Range   WBC 13.1 (H) 4.0 - 10.5 K/uL   RBC 2.82 (L) 3.87 - 5.11 MIL/uL   Hemoglobin 9.4 (L) 12.0 - 15.0 g/dL   HCT 26.0 (L) 36.0 - 46.0 %   MCV 92.2 78.0 - 100.0 fL   MCH 33.3 26.0 - 34.0 pg   MCHC 36.2 (H) 30.0 - 36.0 g/dL   RDW 13.8 11.5 - 15.5 %   Platelets 105 (L) 150 - 400 K/uL    Comment: CONSISTENT WITH PREVIOUS RESULT  Glucose, capillary     Status: Abnormal   Collection Time: 01/28/15  7:55 AM  Result Value Ref Range   Glucose-Capillary 132 (H) 70 - 99 mg/dL   Comment 1 Notify RN     Imaging / Studies: Nm Hepatobiliary Including Gb  01/26/2015   CLINICAL DATA:  Gallstones.  Distended gallbladder on prior CT.  EXAM: NUCLEAR MEDICINE HEPATOBILIARY IMAGING  TECHNIQUE: Sequential images of the abdomen were obtained out to 60 minutes following intravenous administration of radiopharmaceutical.  RADIOPHARMACEUTICALS:  5.0 [place] RD-40C Choletec  COMPARISON:  CT 01/24/2015  FINDINGS: Prompt uptake and excretion of radiotracer by the liver. Activity is seen in small bowel by 30 minutes. No activity noted in the gallbladder at 90 minutes. Therefore, 4 mg of morphine was administered intravenously. Continued imaging demonstrates filling of the gallbladder post morphine.  IMPRESSION: Delayed filling of the gallbladder, but the gallbladder fills after morphine administration compatible with patent cystic duct. No evidence of biliary obstruction.   Electronically Signed   By: Rolm Baptise M.D.   On: 01/26/2015 14:28   Dg Chest Port 1 View  01/28/2015   CLINICAL DATA:  Acute respiratory failure.  EXAM: PORTABLE CHEST - 1 VIEW  COMPARISON:  January 27, 2015.  FINDINGS: Stable cardiomediastinal silhouette. No pneumothorax is noted. Left lung is  clear. New right basilar opacity is noted concerning for pneumonia or atelectasis, with possible associated pleural effusion. Left-sided PICC line is  noted with distal tip overlying expected position of the SVC.  IMPRESSION: New right basilar opacity is noted concerning for pneumonia or atelectasis with associated pleural effusion. Followup radiographs are recommended.   Electronically Signed   By: Marijo Conception, M.D.   On: 01/28/2015 08:26   Dg Chest Port 1 View  01/27/2015   CLINICAL DATA:  69 year old female with sepsis, septic shock. Initial encounter.  EXAM: PORTABLE CHEST - 1 VIEW  COMPARISON:  01/24/2015 and earlier.  FINDINGS: Portable AP upright view at 0802 hours. Stable left PICC line. Mildly improved lung volumes and bibasilar ventilation. Bilateral increased interstitial markings have regressed and now appear at baseline compared to [place] stands. No pneumothorax. No pleural effusion or consolidation. Stable cardiac size and mediastinal contours.  IMPRESSION: Regressed bilateral interstitial opacity, lung parenchyma now all appears at baseline compared to 2015 studies. No new cardiopulmonary abnormality.   Electronically Signed   By: Genevie Ann M.D.   On: 01/27/2015 08:08    Medications / Allergies:  Scheduled Meds: . antiseptic oral rinse  7 mL Mouth Rinse q12n4p  . aspirin  325 mg Oral Daily  . cefTRIAXone (ROCEPHIN)  IV  2 g Intravenous Q24H  . chlorhexidine  15 mL Mouth Rinse BID  . chlorpheniramine-HYDROcodone  5 mL Oral Q12H  . clindamycin (CLEOCIN) IV  900 mg Intravenous 3 times per day  . estrogens (conjugated)  0.3 mg Oral Q48H  . famotidine  20 mg Oral QHS  . furosemide  200 mg Intravenous Once  . heparin  5,000 Units Subcutaneous 3 times per day  . ipratropium-albuterol  3 mL Nebulization Q6H  . LORazepam  1 mg Oral Once  . sertraline  50 mg Oral Daily  . sodium chloride  3 mL Intravenous Q12H   Continuous Infusions: . diltiazem (CARDIZEM) infusion 10 mg/hr (01/28/15 0700)  .  sodium bicarbonate  infusion 1000 mL 10 mL/hr at 01/28/15 0800   PRN Meds:.sodium chloride, acetaminophen **OR** acetaminophen,  guaifenesin, HYDROcodone-homatropine, levalbuterol, loperamide, ondansetron **OR** ondansetron (ZOFRAN) IV, sodium chloride  Antibiotics: Anti-infectives    Start     Dose/Rate Route Frequency Ordered Stop   01/26/15 1530  cefTRIAXone (ROCEPHIN) 2 g in dextrose 5 % 50 mL IVPB - Premix     2 g 100 mL/hr over 30 Minutes Intravenous Every 24 hours 01/26/15 1505     01/25/15 1200  vancomycin (VANCOCIN) 1,500 mg in sodium chloride 0.9 % 500 mL IVPB  Status:  Discontinued     1,500 mg 250 mL/hr over 120 Minutes Intravenous Every 48 hours 01/23/15 1417 01/25/15 1015   01/25/15 1100  clindamycin (CLEOCIN) IVPB 900 mg     900 mg 100 mL/hr over 30 Minutes Intravenous 3 times per day 01/25/15 1014     01/24/15 1600  doxycycline (VIBRAMYCIN) 100 mg in dextrose 5 % 250 mL IVPB  Status:  Discontinued     100 mg 125 mL/hr over 120 Minutes Intravenous Every 12 hours 01/24/15 1500 01/25/15 1013   01/24/15 1400  piperacillin-tazobactam (ZOSYN) IVPB 2.25 g  Status:  Discontinued     2.25 g 100 mL/hr over 30 Minutes Intravenous 4 times per day 01/24/15 1148 01/26/15 1505   01/24/15 1000  vancomycin (VANCOCIN) IVPB 1000 mg/200 mL premix  Status:  Discontinued     1,000 mg 200 mL/hr over 60 Minutes Intravenous Every 24  hours 01/23/15 0846 01/23/15 1417   01/23/15 2200  piperacillin-tazobactam (ZOSYN) IVPB 3.375 g  Status:  Discontinued     3.375 g 12.5 mL/hr over 240 Minutes Intravenous Every 8 hours 01/23/15 0846 01/24/15 1148   01/23/15 1000  oseltamivir (TAMIFLU) capsule 30 mg  Status:  Discontinued     30 mg Oral 2 times daily 01/23/15 0803 01/23/15 1413   01/23/15 0900  vancomycin (VANCOCIN) 2,000 mg in sodium chloride 0.9 % 500 mL IVPB     2,000 mg 250 mL/hr over 120 Minutes Intravenous  Once 01/23/15 0846 01/23/15 1401   01/23/15 0845  piperacillin-tazobactam (ZOSYN) IVPB 3.375 g     3.375 g 100 mL/hr over 30 Minutes Intravenous  Once 01/23/15 0846 01/23/15 2013        Assessment/Plan Cholelithiasis Abdominal pain  -tolerating PO, no abdominal pain.  Can follow up on OP basis for interval cholecystectomy.  Group A strep bacteremia -Skin source -ID following -MRI Today to r/o an abscess.  Please call [facility] if there is anything that needs to be drained. -we will be available as needed.   Erby Pian, [facility] Pager [phone]) For consults and floor pages call [phone])  01/28/2015 9:34 AM

## 2015-01-28 NOTE — Procedures (Signed)
Hemodialysis Catheter Insertion Procedure Note Amanda Wilkins [id number] Jun 17, 1946  Procedure: Insertion of Hemodialysis Catheter Indications: Hemodialysis  Procedure Details Consent: Risks of procedure as well as the alternatives and risks of each were explained to the (patient/caregiver).  Consent for procedure obtained.   Time Out: Verified patient identification, verified procedure, site/side was marked, verified correct patient position, special equipment/implants available, medications/allergies/relevent history reviewed, required imaging and test results available.  Performed  Maximum sterile technique was used including antiseptics, cap, gloves, gown, hand hygiene, mask and sheet. Skin prep: Chlorhexidine; local anesthetic administered A triple lumen HD catheter was placed in the right internal jugular vein using the Seldinger technique.  Evaluation Blood flow good Complications: No apparent complications Patient did tolerate procedure well. Chest X-ray ordered to verify placement.  CXR: pending.   Procedure performed with ultrasound guidance for real time vessel cannulation.     U/S used in placement.  I was present and supervised the entire procedure.  Rush Farmer, M.D. [facility] Pulmonary/Critical Care Medicine. Pager: [phone]. After hours pager: [phone].  01/28/2015, 2:56 PM

## 2015-01-29 ENCOUNTER — Inpatient Hospital Stay (HOSPITAL_COMMUNITY): Payer: Medicare Other

## 2015-01-29 DIAGNOSIS — M25511 Pain in right shoulder: Secondary | ICD-10-CM

## 2015-01-29 DIAGNOSIS — K8080 Other cholelithiasis without obstruction: Secondary | ICD-10-CM

## 2015-01-29 DIAGNOSIS — A400 Sepsis due to streptococcus, group A: Principal | ICD-10-CM

## 2015-01-29 DIAGNOSIS — J9600 Acute respiratory failure, unspecified whether with hypoxia or hypercapnia: Secondary | ICD-10-CM

## 2015-01-29 DIAGNOSIS — K8020 Calculus of gallbladder without cholecystitis without obstruction: Secondary | ICD-10-CM | POA: Diagnosis present

## 2015-01-29 DIAGNOSIS — N289 Disorder of kidney and ureter, unspecified: Secondary | ICD-10-CM

## 2015-01-29 LAB — COMPREHENSIVE METABOLIC PANEL
ALT: 41 U/L — ABNORMAL HIGH (ref 0–35)
ANION GAP: 16 — AB (ref 5–15)
AST: 35 U/L (ref 0–37)
Albumin: 2.2 g/dL — ABNORMAL LOW (ref 3.5–5.2)
Alkaline Phosphatase: 109 U/L (ref 39–117)
BUN: 48 mg/dL — AB (ref 6–23)
CALCIUM: 7.6 mg/dL — AB (ref 8.4–10.5)
CHLORIDE: 90 mmol/L — AB (ref 96–112)
CO2: 24 mmol/L (ref 19–32)
Creatinine, Ser: 3.82 mg/dL — ABNORMAL HIGH (ref 0.50–1.10)
GFR calc non Af Amer: 11 mL/min — ABNORMAL LOW (ref >90)
GFR, EST AFRICAN AMERICAN: 13 mL/min — AB (ref >90)
Glucose, Bld: 149 mg/dL — ABNORMAL HIGH (ref 70–99)
Potassium: 3.6 mmol/L (ref 3.5–5.1)
Sodium: 130 mmol/L — ABNORMAL LOW (ref 135–145)
Total Bilirubin: 0.6 mg/dL (ref 0.3–1.2)
Total Protein: 5.7 g/dL — ABNORMAL LOW (ref 6.0–8.3)

## 2015-01-29 LAB — CULTURE, BLOOD (ROUTINE X 2): Culture: NO GROWTH

## 2015-01-29 LAB — CBC
HCT: 27 % — ABNORMAL LOW (ref 36.0–46.0)
Hemoglobin: 9.7 g/dL — ABNORMAL LOW (ref 12.0–15.0)
MCH: 33.9 pg (ref 26.0–34.0)
MCHC: 35.9 g/dL (ref 30.0–36.0)
MCV: 94.4 fL (ref 78.0–100.0)
Platelets: 140 K/uL — ABNORMAL LOW (ref 150–400)
RBC: 2.86 MIL/uL — ABNORMAL LOW (ref 3.87–5.11)
RDW: 14.1 % (ref 11.5–15.5)
WBC: 11.8 K/uL — ABNORMAL HIGH (ref 4.0–10.5)

## 2015-01-29 LAB — HEPATITIS B CORE ANTIBODY, TOTAL: Hep B Core Total Ab: NEGATIVE

## 2015-01-29 LAB — HEPATITIS B SURFACE ANTIGEN: Hepatitis B Surface Ag: NEGATIVE

## 2015-01-29 LAB — HEPATITIS B SURFACE ANTIBODY,QUALITATIVE: HEP B S AB: NONREACTIVE

## 2015-01-29 MED ORDER — SODIUM CHLORIDE 0.9 % IV SOLN: 100.0000 mL | INTRAVENOUS | Status: DC | PRN

## 2015-01-29 MED ORDER — TRAMADOL HCL 50 MG PO TABS
100.0000 mg | ORAL_TABLET | Freq: Two times a day (BID) | ORAL | Status: DC
  Administered 2015-01-29 – 2015-01-31 (×4): 100 mg via ORAL
  Filled 2015-01-29 (×4): qty 2

## 2015-01-29 MED ORDER — ALTEPLASE 2 MG IJ SOLR
2.0000 mg | Freq: Once | INTRAMUSCULAR | Status: DC | PRN
  Filled 2015-01-29: qty 2

## 2015-01-29 MED ORDER — HYDROCODONE-HOMATROPINE 5-1.5 MG/5ML PO SYRP: 5.0000 mL | ORAL_SOLUTION | Freq: Once | ORAL | Status: DC

## 2015-01-29 MED ORDER — TRAMADOL HCL 50 MG PO TABS
100.0000 mg | ORAL_TABLET | Freq: Once | ORAL | Status: AC
  Administered 2015-01-29: 100 mg via ORAL
  Filled 2015-01-29: qty 2

## 2015-01-29 MED ORDER — LIDOCAINE HCL (PF) 1 % IJ SOLN: 5.0000 mL | INTRAMUSCULAR | Status: DC | PRN

## 2015-01-29 MED ORDER — PENTAFLUOROPROP-TETRAFLUOROETH EX AERO: 1.0000 "application " | INHALATION_SPRAY | CUTANEOUS | Status: DC | PRN

## 2015-01-29 MED ORDER — LEVALBUTEROL HCL 0.63 MG/3ML IN NEBU
0.6300 mg | INHALATION_SOLUTION | Freq: Four times a day (QID) | RESPIRATORY_TRACT | Status: DC
  Administered 2015-01-29 – 2015-01-30 (×6): 0.63 mg via RESPIRATORY_TRACT
  Filled 2015-01-29 (×10): qty 3

## 2015-01-29 MED ORDER — HEPARIN SODIUM (PORCINE) 1000 UNIT/ML DIALYSIS: 1000.0000 [IU] | INTRAMUSCULAR | Status: DC | PRN

## 2015-01-29 MED ORDER — NEPRO/CARBSTEADY PO LIQD
237.0000 mL | ORAL | Status: DC | PRN
  Filled 2015-01-29: qty 237

## 2015-01-29 MED ORDER — LIDOCAINE-PRILOCAINE 2.5-2.5 % EX CREA
1.0000 "application " | TOPICAL_CREAM | CUTANEOUS | Status: DC | PRN
  Filled 2015-01-29: qty 5

## 2015-01-29 NOTE — Progress Notes (Addendum)
PULMONARY / CRITICAL CARE MEDICINE   Name: Amanda Wilkins MRN: [id number] DOB: May 12, 1946    ADMISSION DATE:  01/22/2015 CONSULTATION DATE:  01/24/15  REFERRING MD :  Dr. Broadus John   CHIEF COMPLAINT:  Sepsis   INITIAL PRESENTATION: 69 y/o F admitted 4/9 with a 2 day hx of N/V/D, fever/malaise and palpitations.  PCCM consulted for evaluation 4/11. Developed hypotension with cardizem gtt.     STUDIES:  4/10  CT Chest >> inflammation in R axilla extending inferiorly along R chest, no focal evidence of abscess or gas, mild dependent atx 4/11  CT Abd/Pelvis >> no renal obs, Gallbladder distention with possible internal gallstone identified 4/11  Renal US >> no hydronephrosis, cholelithiasis, small L pleural effusion  4/12  [place] Chest >> sub-q and chest wall muscle edema with no discrete or drainable fluid collection, regional lymph nodes normal 4/12  LE Duplex >> neg for DVT 4/12  RUE Duplex >> neg for DVT 4/13  HIDA Scan >> delayed filling of gallbladder, but the gallbladder fills after morphine administration, no evidence of biliary obstruction 4/14  MRI Abd >>  4/14  MRI Chest >>   SIGNIFICANT EVENTS: 4/09  Admit with N/V/D. Lactic acid 3.6 4/11  PCCM consulted for sepsis.  Lactic acid 1.4 4/12  Hypotensive on dilt gtt, tachy on dopamine gtt, some resp distress with fluids 4/13  Off pressors 4/14  Desaturated overnight into mid 80's on RA while sleeping    SUBJECTIVE:   Still with incessant coughing.  Pt had HD rx 4/15    VITAL SIGNS: Temp:  [97.3 F (36.3 C)-97.7 F (36.5 C)] 97.4 F (36.3 C) (04/16 0852) Pulse Rate:  [25-139] 51 (04/16 0700) Resp:  [13-22] 21 (04/16 0700) BP: (87-172)/(48-102) 124/90 mmHg (04/16 0852) SpO2:  [87 %-100 %] 90 % (04/16 0852) FiO2 (%):  [30 %-35 %] 35 % (04/15 1200) Weight:  [110.6 kg (243 lb 13.3 oz)] 110.6 kg (243 lb 13.3 oz) (04/16 0500)   HEMODYNAMICS: CVP:  [12 mmHg-14 mmHg] 12 mmHg    INTAKE / OUTPUT:  Intake/Output Summary  (Last 24 hours) at 01/29/15 0856 Last data filed at 01/29/15 0853  Gross per 24 hour  Intake   1130 ml  Output   6100 ml  Net  -4970 ml   PHYSICAL EXAMINATION: General:  Obese female in NAD Neuro:  AAOx4, speech clear, MAE to command HEENT:  MM pink/moist, short / thick neck, PERRL, EOM-I and MMM Cardiovascular:  s1s2 distant, irr irr  Lungs:  resp's even/non-labored, lungs bilaterally diminished lower posterior, few exp wheezes Abdomen:  Obese, soft, bsx4 active  Musculoskeletal:  No acute deformities  Skin:  Moist, warm, no overt edema except at the right flank where there is erythema and fluctuant skin No progression of erythema.  LABS:  CBC  Recent Labs Lab 01/27/15 0420 01/28/15 0445 01/29/15 0713  WBC 14.0* 13.1* 11.8*  HGB 9.9* 9.4* 9.7*  HCT 26.8* 26.0* 27.0*  PLT 81* 105* 140*   Coag's  Recent Labs Lab 01/24/15 2043  APTT 35  INR 1.18    BMET  Recent Labs Lab 01/27/15 0420 01/28/15 0445 01/29/15 0713  NA 128* 126* 130*  K 3.6 3.2* 3.6  CL 94* 89* 90*  CO2 19 21 24   BUN 83* 82* 48*  CREATININE 5.00* 5.21* 3.82*  GLUCOSE 123* 143* 149*   Electrolytes  Recent Labs Lab 01/22/15 1750  01/26/15 0400 01/27/15 0420 01/28/15 0445 01/29/15 0713  CALCIUM 8.6  < > 7.4*  7.8* 7.9* 7.6*  MG 1.8  --  2.4 2.6*  --   --   PHOS 3.3  --  5.6* 5.3*  --   --   < > = values in this interval not displayed.   Sepsis Markers  Recent Labs Lab 01/23/15 1900 01/24/15 1045 01/24/15 2043 01/26/15 0400 01/28/15 0445  LATICACIDVEN 3.3* 1.4 1.3  --   --   PROCALCITON  --   --  48.23 37.72 14.72   ABG  Recent Labs Lab 01/24/15 1130 01/24/15 2300  PHART 7.386 7.300*  PCO2ART 19.9* 22.8*  PO2ART 90.8 137.0*   Liver Enzymes  Recent Labs Lab 01/24/15 0500 01/25/15 0500 01/29/15 0713  AST 54* 38* 35  ALT 39* 37* 41*  ALKPHOS 38* 41 109  BILITOT 1.4* 1.2 0.6  ALBUMIN 2.2* 2.0* 2.2*   Cardiac Enzymes  Recent Labs Lab 01/23/15 1050  01/23/15 1230  TROPONINI 0.03 <0.03   Glucose  Recent Labs Lab 01/24/15 2237 01/26/15 1607 01/28/15 0755 01/28/15 1139  GLUCAP 89 128* 132* 132*   Imaging Dg Chest Port 1 View  01/28/2015   CLINICAL DATA:  Central line placement.  EXAM: PORTABLE CHEST - 1 VIEW  COMPARISON:  01/28/2015.  FINDINGS: The right IJ central venous catheter tip is in the proximal SVC above the level of the carina. No complicating features are demonstrated. Persistent bibasilar atelectasis. Possible small right effusion.  IMPRESSION: Right IJ central venous catheter tip is in the proximal SVC. No complicating features.  Persistent bibasilar atelectasis and possible small right effusion.   Electronically Signed   By: Marijo Sanes M.D.   On: 01/28/2015 15:25   Dg Chest Port 1 View  01/28/2015   CLINICAL DATA:  Acute respiratory failure.  EXAM: PORTABLE CHEST - 1 VIEW  COMPARISON:  January 27, 2015.  FINDINGS: Stable cardiomediastinal silhouette. No pneumothorax is noted. Left lung is clear. New right basilar opacity is noted concerning for pneumonia or atelectasis, with possible associated pleural effusion. Left-sided PICC line is noted with distal tip overlying expected position of the SVC.  IMPRESSION: New right basilar opacity is noted concerning for pneumonia or atelectasis with associated pleural effusion. Followup radiographs are recommended.   Electronically Signed   By: Marijo Conception, M.D.   On: 01/28/2015 08:26   ASSESSMENT / PLAN:  PULMONARY A: Persistent dependent Atelectasis  Tachypnea - metabolic acidosis Acute pulm edema improved with HD on 4/15 High D-dimer, but doubt PE>>prob from sepsis / toxemia from GAS Suspected OSA - Cough P:   Pulmonary hygiene Oxygen titration  Cont nebs Oxygen to support sats > 92% Cont  IS per RT protocol to combat atelectasis. Hycodan plus tramadol for cough   CARDIOVASCULAR PICC LUE 4/10 >>  R IJ HD catheter 01/28/15>>> A:  Persistent Atrial Fibrillation -  hx of PAF in past, currently related to sepsis of unclear etiology  Septic shock resolved  P:  Cardizem gtt, transition to PO  prob OK today Hep drip is ok with CCM per cardiology and primary team D/c ASA.once on hep iv  RENAL A:   Acute Kidney Injury - in setting of sepsis Lactic Acidosis - resolved Hyponatremia  Persistent metabolic acidosis.Improved  P:   Hold nephrotoxic agents. Dc bicarb drip .  Monitor I/O's. Renal dose abx/meds per pharmacy. Further HD per renal  S/p one Rx 4/15  GASTROINTESTINAL A:   Obesity Cholecystitis ruled out.  P:   PRN Zofran. PPI to ensure reflux not contributing to cough  HEMATOLOGIC A:   Thrombocytopenia improved and d/t sepsis/toxemia P:  Trend CBC DVT: heparin sq>>change to IV heparin  INFECTIOUS A:   ABX PER ID Septic shock - GPC's. Favor cellulitis over gastroenteritis or acute chole Nausea / Vomiting / Diarrhea Chest Wall Induration / R Breast / Axilla Pain  - new erythema noted on R chest and low back 4/14 P:   BCx2 4/11 >> Group A Strep Pyogenes >>S to ceftriaxone  C-Diff 4/11 >> neg Flu 4/11 >> neg  UA 4/10 >> few bacteria, 7-10 WBC  UC 4/11 >> neg ABX per ID:  Clindamycin and Rocephin  MRI today   ENDOCRINE A:   Mild Hyperglycemia P:   Monitor glucose on BMP, if consistently > 180 add SSI   NEUROLOGIC A:   Acute encephalopathy - sleep talking resolved  Depression  P:   Continue zoloft, per primary SVC  FAMILY  - Updates: husband updated by phone 01/29/2015.   Pt now on [facility] svc.  I have continued to see pt daily d/t fact pt is a family friend.  Defer all major Rx changes to [facility]  , I will phone [facility] MD today.   Elsie Stain, MD Beeper  [phone]  Cell  [phone]  If no response or cell goes to voicemail, call beeper [phone]   01/29/2015, 8:56 AM

## 2015-01-29 NOTE — Progress Notes (Addendum)
Primary cardiologist: Dr. [name]  Seen for followup: Persistent atrial fibrillation  Subjective:    Still feels weak, intermittent coughing. No chest pain or feelings of palpitations.  Objective:   Temp:  [97.3 F (36.3 C)-97.9 F (36.6 C)] 97.9 F (36.6 C) (04/16 1238) Pulse Rate:  [25-139] 107 (04/16 1238) Resp:  [13-22] 15 (04/16 1238) BP: (87-166)/(48-102) 110/58 mmHg (04/16 1238) SpO2:  [87 %-100 %] 95 % (04/16 1238) Weight:  [243 lb 13.3 oz (110.6 kg)] 243 lb 13.3 oz (110.6 kg) (04/16 0500) Last BM Date: 01/23/15  Filed Weights   01/27/15 0600 01/28/15 0500 01/29/15 0500  Weight: 252 lb 10.4 oz (114.6 kg) 251 lb 8.7 oz (114.1 kg) 243 lb 13.3 oz (110.6 kg)    Intake/Output Summary (Last 24 hours) at 01/29/15 1257 Last data filed at 01/29/15 1115  Gross per 24 hour  Intake    703 ml  Output   5400 ml  Net  -4697 ml    Telemetry: Atrial fibrillation with RVR.  Exam:  General: Obese, chronically ill-appearing woman.  Lungs: Coarse breath sounds.  Cardiac: Distant, irregularly irregular.  Abdomen: NABS.  Extremities: Mild leg edema.  Lab Results:  Basic Metabolic Panel:  Recent Labs Lab 01/22/15 1750  01/26/15 0400 01/27/15 0420 01/28/15 0445 01/29/15 0713  NA 127*  < > 126* 128* 126* 130*  K 3.8  < > 3.0* 3.6 3.2* 3.6  CL 94*  < > 94* 94* 89* 90*  CO2 16*  < > 15* 19 21 24   GLUCOSE 156*  < > 111* 123* 143* 149*  BUN 32*  < > 79* 83* 82* 48*  CREATININE 1.97*  < > 4.66* 5.00* 5.21* 3.82*  CALCIUM 8.6  < > 7.4* 7.8* 7.9* 7.6*  MG 1.8  --  2.4 2.6*  --   --   < > = values in this interval not displayed.  Liver Function Tests:  Recent Labs Lab 01/24/15 0500 01/25/15 0500 01/29/15 0713  AST 54* 38* 35  ALT 39* 37* 41*  ALKPHOS 38* 41 109  BILITOT 1.4* 1.2 0.6  PROT 5.0* 5.0* 5.7*  ALBUMIN 2.2* 2.0* 2.2*    CBC:  Recent Labs Lab 01/27/15 0420 01/28/15 0445 01/29/15 0713  WBC 14.0* 13.1* 11.8*  HGB 9.9* 9.4* 9.7*  HCT  26.8* 26.0* 27.0*  MCV 90.8 92.2 94.4  PLT 81* 105* 140*    Cardiac Enzymes:  Recent Labs Lab 01/23/15 1050 01/23/15 1230  TROPONINI 0.03 <0.03    Echocardiogram 01/25/2015: Study Conclusions  - Left ventricle: The cavity size was normal. Wall thickness was normal. Systolic function was normal. The estimated ejection fraction was in the range of 55% to 60%. Wall motion was normal; there were no regional wall motion abnormalities. - Mitral valve: Calcified annulus. There was mild regurgitation. - Left atrium: The atrium was mildly dilated. - Pulmonary arteries: Systolic pressure was mildly increased. PA peak pressure: 32 mm Hg (S).  Impressions:  - Normal LV function; mild LAE; mild MR and TR; mildly elevated pulmonary pressure.   Medications:   Scheduled Medications: . antiseptic oral rinse  7 mL Mouth Rinse q12n4p  . aspirin  325 mg Oral Daily  . cefTRIAXone (ROCEPHIN)  IV  2 g Intravenous Q24H  . chlorhexidine  15 mL Mouth Rinse BID  . chlorpheniramine-HYDROcodone  5 mL Oral Q12H  . clindamycin (CLEOCIN) IV  900 mg Intravenous 3 times per day  . estrogens (conjugated)  0.3 mg Oral  Q48H  . famotidine  20 mg Oral QHS  . heparin  5,000 Units Subcutaneous 3 times per day  . HYDROcodone-homatropine  5 mL Oral Once  . levalbuterol  0.63 mg Nebulization Q6H  . LORazepam  1 mg Oral Once  . sertraline  50 mg Oral Daily  . sodium chloride  3 mL Intravenous Q12H  . traMADol  100 mg Oral Q12H     Infusions: . diltiazem (CARDIZEM) infusion 10 mg/hr (01/29/15 0955)     PRN Medications:  sodium chloride, sodium chloride, acetaminophen **OR** acetaminophen, feeding supplement (NEPRO CARB STEADY), guaifenesin, heparin, heparin, HYDROcodone-homatropine, lidocaine (PF), lidocaine-prilocaine, loperamide, ondansetron **OR** ondansetron (ZOFRAN) IV, pentafluoroprop-tetrafluoroeth, sodium chloride   Assessment:   1. Persistent atrial fibrillation with RVR,  continues on intravenous diltiazem. She has a history of paroxysmal atrial fibrillation, has been on aspirin and Cardizem CD as an outpatient. CHADSVASC score is 2.  2. Septic shock with GHS bacteremia and erysipelas, followed by infectious disease service.  3. Cholelithiasis, not entirely clear that this is clinically active at this time.  4. Thrombocytopenia, improving, platelets of 140.  5. Acute renal failure, requiring hemodialysis at this time. Followed by nephrology.  6. Chart review finds abnormal Myoview from March of this year indicating area of inferior/inferolateral ischemia with normal LVEF. This has not been further investigated as yet. Chart notes indicate a history of dyspnea on exertion. Cardiac enzymes negative during this hospitalization.   Plan/Discussion:    Met with patient and son. She has had some slow improvement, but still not back to baseline, anticipates another hemodialysis session today. From a cardiac perspective, would continue aspirin and intravenous diltiazem for now. Initiating anticoagulation eventually with NOAC can be discussed, although would wait until she further clinically stabilizes (platelet count improving, hopefully lines can be removed, etc.). Do not feel strongly about initiating intravenous heparin in the short-term in light of higher bleeding risk. As far as the recent abnormal Myoview is concerned from March, this will eventually need to be pursued further, although now is not time with her acute comorbidities.   Satira Sark, M.D., F.A.C.C.

## 2015-01-29 NOTE — Progress Notes (Signed)
[place] TEAM 1 - Stepdown/ICU TEAM Progress Note  Amanda Wilkins ZOX:[id number] DOB: September 28, 1946 DOA: 01/22/2015 PCP: Sonda Primes, MD  Admit HPI / Brief Narrative: 69 yo F w/ h/o pafib and htn who presented w/ 2 days of n/v, general malaise, diarrhea, and fever. When she started having palpitations she came to ED. She had vomited several times and was unable to hold down her home meds.  Significant Events: 4/09 Admit with N/V/D - lactic acid 3.6 4/10 CT Chest > inflammation in R axilla extending inferiorly along R chest, no focal evidence of abscess or gas, mild dependent atx 4/11 CT Abd/Pelvis > no renal obs, Gallbladder distention with possible internal gallstone identified 4/11 Renal US > no hydronephrosis, cholelithiasis, small L pleural effusion  4/11 PCCM consulted for sepsis. Lactic acid 1.4 4/12 Hypotensive on dilt gtt, tachy on dopamine gtt, some resp distress with fluids 4/12 LE Duplex > neg for DVT 4/12 RUE Duplex > neg for DVT 4/13 Off pressors 4/13 HIDA Scan > delayed filling of gallbladder, but the gallbladder fills after morphine administration, no evidence of biliary obstruction 4/14 Desaturated into mid 80's on RA while sleeping  HPI/Subjective: The patient has no new complaints today but states she feels "about the same."  She still feels somewhat short of breath and a nagging cough persists.  She denies chest pain or abdominal pain.  She feels that her flank erythema and discomfort is improving.  Assessment/Plan:  Septic shock due to Group A strep cellulitis / chest wall induration  ID following - MRI did not reveal a focal pocket of infection/abscess  ?Viral Gastroenteritis - doubt Suspect her initial GI symptoms were likely due to her sepsis/severe cellulitis - symptoms improved at present  Persistent dependent Atelectasis  Mobilize as able  Acute pulm edema medical management has been difficult due to acute kidney failure - Nephrology  initiated hemodialysis 4/15 and patient is to get a second treatment today  Persistent Atrial Fibrillation  Cardiology is following - rate remains reasonably controlled - Cardiology team now suggests holding anticoagulation in the acute term  Acute Kidney Injury  hemodynamically mediated due to afib, hypotension, bacteremia, COX-2 inhib - hemodialysis has been initiated  Hyponatremia due to kidney failure - improving with dialysis  Thrombocytopenia Probable DIC - platelet count climbing consistently   Hyperglycemia previous A1c's have been normal - recheck pending  Obesity - Body mass index is 39.87 kg/(m^2).  High D-dimer doubt PE, will not scan given renal function - venous duplex neg for DVT  Suspected OSA Saturations remaining stable on 4 L nasal cannula throughout the day - cont nocturnal CPAP   Code Status: FULL Family Communication: no family present at time of exam Disposition Plan: SDU  Consultants: Cardiology  ID Nephrology   Antibiotics: Ceftriaxone 4/13 > Clindamycin 4/12 >  DVT prophylaxis: Subcutaneous heparin  Objective: Blood pressure 126/85, pulse 84, temperature 97.9 F (36.6 C), temperature source Oral, resp. rate 18, height 5\' 6"  (1.676 m), weight 112 kg (246 lb 14.6 oz), SpO2 97 %.  Intake/Output Summary (Last 24 hours) at 01/29/15 1520 Last data filed at 01/29/15 1300  Gross per 24 hour  Intake    333 ml  Output   5200 ml  Net  -4867 ml   Exam: General: No acute respiratory distress - alert and oriented Lungs: poor air movement throughout all fields with stable bibasilar crackles but no wheeze  Cardiovascular: distant heart sounds, regular rate, no appreciable murmur  Abdomen: Nontender, obese, soft, bowel  sounds positive, no rebound, no ascites, no appreciable mass Extremities: No significant cyanosis, clubbing; 1+ edema bilateral lower extremities   Data Reviewed: Basic Metabolic Panel:  Recent Labs Lab 01/22/15 1750   01/25/15 0500 01/26/15 0400 01/27/15 0420 01/28/15 0445 01/29/15 0713  NA 127*  < > 125* 126* 128* 126* 130*  K 3.8  < > 3.8 3.0* 3.6 3.2* 3.6  CL 94*  < > 97 94* 94* 89* 90*  CO2 16*  < > 13* 15* 19 21 24   GLUCOSE 156*  < > 92 111* 123* 143* 149*  BUN 32*  < > 66* 79* 83* 82* 48*  CREATININE 1.97*  < > 4.06* 4.66* 5.00* 5.21* 3.82*  CALCIUM 8.6  < > 7.2* 7.4* 7.8* 7.9* 7.6*  MG 1.8  --   --  2.4 2.6*  --   --   PHOS 3.3  --   --  5.6* 5.3*  --   --   < > = values in this interval not displayed.  Liver Function Tests:  Recent Labs Lab 01/24/15 0500 01/25/15 0500 01/29/15 0713  AST 54* 38* 35  ALT 39* 37* 41*  ALKPHOS 38* 41 109  BILITOT 1.4* 1.2 0.6  PROT 5.0* 5.0* 5.7*  ALBUMIN 2.2* 2.0* 2.2*    Coags:  Recent Labs Lab 01/24/15 2043  INR 1.18    Recent Labs Lab 01/24/15 2043  APTT 35    CBC:  Recent Labs Lab 01/24/15 2043 01/25/15 0500 01/26/15 0400 01/27/15 0420 01/28/15 0445 01/29/15 0713  WBC 9.0 12.2* 12.5* 14.0* 13.1* 11.8*  NEUTROABS 8.0*  --   --   --   --   --   HGB 11.6* 11.7* 10.5* 9.9* 9.4* 9.7*  HCT 32.4* 32.9* 28.6* 26.8* 26.0* 27.0*  MCV 94.5 94.0 92.9 90.8 92.2 94.4  PLT 73*  74* 80* 67* 81* 105* 140*    Cardiac Enzymes:  Recent Labs Lab 01/23/15 1050 01/23/15 1230  TROPONINI 0.03 <0.03    CBG:  Recent Labs Lab 01/24/15 2237 01/26/15 1607 01/28/15 0755 01/28/15 1139  GLUCAP 89 128* 132* 132*    Recent Results (from the past 240 hour(s))  Culture, blood (routine x 2)     Status: None   Collection Time: 01/23/15 12:23 PM  Result Value Ref Range Status   Specimen Description BLOOD RIGHT HAND  Final   Special Requests BOTTLES DRAWN AEROBIC ONLY 4CC  Final   Culture   Final    GROUP A STREP (S.PYOGENES) ISOLATED Note: CRITICAL RESULT CALLED TO, READ BACK BY AND VERIFIED WITH: CHRISTINE HINSHAW @ 0935 ON [id number] BY [facility] FAXED TO [facility] TO [facility] 01/27/15 BY LYONK Note: Gram Stain Report Called to,Read  Back By and Verified With: Murlean Hark RN on 01/24/15 at 06:55 by Christie Nottingham Performed at [facility]    Report Status 01/28/2015 FINAL  Final  Culture, blood (routine x 2)     Status: None   Collection Time: 01/23/15 12:30 PM  Result Value Ref Range Status   Specimen Description BLOOD LEFT HAND  Final   Special Requests BOTTLES DRAWN AEROBIC ONLY 5CC  Final   Culture   Final    NO GROWTH 5 DAYS Performed at [facility]    Report Status 01/29/2015 FINAL  Final  Clostridium Difficile by PCR     Status: None   Collection Time: 01/24/15 12:59 PM  Result Value Ref Range Status   C difficile by pcr NEGATIVE  NEGATIVE Final  Culture, Urine     Status: None   Collection Time: 01/24/15  2:02 PM  Result Value Ref Range Status   Specimen Description URINE, CATHETERIZED  Final   Special Requests NONE  Final   Colony Count NO GROWTH Performed at [facility]   Final   Culture NO GROWTH Performed at [facility]   Final   Report Status 01/26/2015 FINAL  Final  MRSA PCR Screening     Status: None   Collection Time: 01/24/15  4:30 PM  Result Value Ref Range Status   MRSA by PCR NEGATIVE NEGATIVE Final    Comment:        The GeneXpert MRSA Assay (FDA approved for NASAL specimens only), is one component of a comprehensive MRSA colonization surveillance program. It is not intended to diagnose MRSA infection nor to guide or monitor treatment for MRSA infections.     Studies:   Recent x-ray studies have been reviewed in detail by the Attending Physician  Scheduled Meds:  Scheduled Meds: . antiseptic oral rinse  7 mL Mouth Rinse q12n4p  . aspirin  325 mg Oral Daily  . cefTRIAXone (ROCEPHIN)  IV  2 g Intravenous Q24H  . chlorhexidine  15 mL Mouth Rinse BID  . chlorpheniramine-HYDROcodone  5 mL Oral Q12H  . clindamycin (CLEOCIN) IV  900 mg Intravenous 3 times per day  . estrogens (conjugated)  0.3 mg Oral Q48H  . famotidine  20 mg Oral QHS  .  heparin  5,000 Units Subcutaneous 3 times per day  . HYDROcodone-homatropine  5 mL Oral Once  . levalbuterol  0.63 mg Nebulization Q6H  . LORazepam  1 mg Oral Once  . sertraline  50 mg Oral Daily  . sodium chloride  3 mL Intravenous Q12H  . traMADol  100 mg Oral Q12H    Time spent on care of this patient: 35 mins   Bonham Zingale T , MD   [facility] Office  [phone] Pager - Text Page per Loretha Stapler as per below:  On-Call/Text Page:      [email or web address]      password [email or web address]  If 7PM-7AM, please contact night-coverage [email or web address] Password [email or web address] 01/29/2015, 3:20 PM   LOS: 6 days

## 2015-01-29 NOTE — Progress Notes (Signed)
[facility]        Subjective:  C/o ulcers on roof of mouth  Antibiotics:  Anti-infectives    Start     Dose/Rate Route Frequency Ordered Stop   01/26/15 1530  cefTRIAXone (ROCEPHIN) 2 g in dextrose 5 % 50 mL IVPB - Premix     2 g 100 mL/hr over 30 Minutes Intravenous Every 24 hours 01/26/15 1505     01/25/15 1200  vancomycin (VANCOCIN) 1,500 mg in sodium chloride 0.9 % 500 mL IVPB  Status:  Discontinued     1,500 mg 250 mL/hr over 120 Minutes Intravenous Every 48 hours 01/23/15 1417 01/25/15 1015   01/25/15 1100  clindamycin (CLEOCIN) IVPB 900 mg     900 mg 100 mL/hr over 30 Minutes Intravenous 3 times per day 01/25/15 1014     01/24/15 1600  doxycycline (VIBRAMYCIN) 100 mg in dextrose 5 % 250 mL IVPB  Status:  Discontinued     100 mg 125 mL/hr over 120 Minutes Intravenous Every 12 hours 01/24/15 1500 01/25/15 1013   01/24/15 1400  piperacillin-tazobactam (ZOSYN) IVPB 2.25 g  Status:  Discontinued     2.25 g 100 mL/hr over 30 Minutes Intravenous 4 times per day 01/24/15 1148 01/26/15 1505   01/24/15 1000  vancomycin (VANCOCIN) IVPB 1000 mg/200 mL premix  Status:  Discontinued     1,000 mg 200 mL/hr over 60 Minutes Intravenous Every 24 hours 01/23/15 0846 01/23/15 1417   01/23/15 2200  piperacillin-tazobactam (ZOSYN) IVPB 3.375 g  Status:  Discontinued     3.375 g 12.5 mL/hr over 240 Minutes Intravenous Every 8 hours 01/23/15 0846 01/24/15 1148   01/23/15 1000  oseltamivir (TAMIFLU) capsule 30 mg  Status:  Discontinued     30 mg Oral 2 times daily 01/23/15 0803 01/23/15 1413   01/23/15 0900  vancomycin (VANCOCIN) 2,000 mg in sodium chloride 0.9 % 500 mL IVPB     2,000 mg 250 mL/hr over 120 Minutes Intravenous  Once 01/23/15 0846 01/23/15 1401   01/23/15 0845  piperacillin-tazobactam (ZOSYN) IVPB 3.375 g     3.375 g 100 mL/hr over 30 Minutes Intravenous  Once 01/23/15 0846 01/23/15 2013      Medications: Scheduled Meds: . antiseptic oral  rinse  7 mL Mouth Rinse q12n4p  . aspirin  325 mg Oral Daily  . cefTRIAXone (ROCEPHIN)  IV  2 g Intravenous Q24H  . chlorhexidine  15 mL Mouth Rinse BID  . chlorpheniramine-HYDROcodone  5 mL Oral Q12H  . clindamycin (CLEOCIN) IV  900 mg Intravenous 3 times per day  . estrogens (conjugated)  0.3 mg Oral Q48H  . famotidine  20 mg Oral QHS  . heparin  5,000 Units Subcutaneous 3 times per day  . HYDROcodone-homatropine  5 mL Oral Once  . levalbuterol  0.63 mg Nebulization Q6H  . LORazepam  1 mg Oral Once  . sertraline  50 mg Oral Daily  . sodium chloride  3 mL Intravenous Q12H  . traMADol  100 mg Oral Q12H   Continuous Infusions: . diltiazem (CARDIZEM) infusion 10 mg/hr (01/29/15 0955)   PRN Meds:.sodium chloride, sodium chloride, acetaminophen **OR** acetaminophen, feeding supplement (NEPRO CARB STEADY), guaifenesin, heparin, heparin, HYDROcodone-homatropine, lidocaine (PF), lidocaine-prilocaine, loperamide, ondansetron **OR** ondansetron (ZOFRAN) IV, pentafluoroprop-tetrafluoroeth, sodium chloride    Objective: Weight change: -7 lb 11.5 oz (-3.5 kg)  Intake/Output Summary (Last 24 hours) at 01/29/15 1241 Last data filed at 01/29/15 1115  Gross per 24 hour  Intake  703 ml  Output   5400 ml  Net  -4697 ml   Blood pressure 110/58, pulse 107, temperature 97.9 F (36.6 C), temperature source Oral, resp. rate 15, height 5\' 6"  (1.676 m), weight 243 lb 13.3 oz (110.6 kg), SpO2 95 %. Temp:  [97.3 F (36.3 C)-97.9 F (36.6 C)] 97.9 F (36.6 C) (04/16 1238) Pulse Rate:  [25-139] 107 (04/16 1238) Resp:  [13-22] 15 (04/16 1238) BP: (87-166)/(48-102) 110/58 mmHg (04/16 1238) SpO2:  [87 %-100 %] 95 % (04/16 1238) Weight:  [243 lb 13.3 oz (110.6 kg)] 243 lb 13.3 oz (110.6 kg) (04/16 0500)  Physical Exam: General: Alert and awake, preparing for HD HEENT: anicteric sclera, pupils reactive to light and accommodation, EOMI  ENT:" strawberry tongue" and HSV lesions on  lips  01/29/15:    01/28/15        CVS tachy rate, irr, irr  no murmur rubs or gallops Chest: clear to auscultation bilaterally, no wheezing, rales or rhonchi Abdomen: soft TTP in RUQ, no rebound  Lymph/skin new progressive almost bullous cellulitis extending down flank (which is NOT where she initially presented with pain   Left axilla, breast 01/24/15    01/26/15:      Flank 01/26/15:     01/27/15:      01/28/15:      01/29/15:      Back 01/27/15:    01/28/15:     01/29/15:      Neuro: nonfocal  CBC:  CBC Latest Ref Rng 01/29/2015 01/28/2015 01/27/2015  WBC 4.0 - 10.5 K/uL 11.8(H) 13.1(H) 14.0(H)  Hemoglobin 12.0 - 15.0 g/dL 9.7(L) 9.4(L) 9.9(L)  Hematocrit 36.0 - 46.0 % 27.0(L) 26.0(L) 26.8(L)  Platelets 150 - 400 K/uL 140(L) 105(L) 81(L)       BMET  Recent Labs  01/28/15 0445 01/29/15 0713  NA 126* 130*  K 3.2* 3.6  CL 89* 90*  CO2 21 24  GLUCOSE 143* 149*  BUN 82* 48*  CREATININE 5.21* 3.82*  CALCIUM 7.9* 7.6*     Liver Panel   Recent Labs  01/29/15 0713  PROT 5.7*  ALBUMIN 2.2*  AST 35  ALT 41*  ALKPHOS 109  BILITOT 0.6       Sedimentation Rate No results for input(s): ESRSEDRATE in the last 72 hours. C-Reactive Protein No results for input(s): CRP in the last 72 hours.  Micro Results: Recent Results (from the past 720 hour(s))  Culture, blood (routine x 2)     Status: None   Collection Time: 01/23/15 12:23 PM  Result Value Ref Range Status   Specimen Description BLOOD RIGHT HAND  Final   Special Requests BOTTLES DRAWN AEROBIC ONLY 4CC  Final   Culture   Final    GROUP A STREP (S.PYOGENES) ISOLATED Note: CRITICAL RESULT CALLED TO, READ BACK BY AND VERIFIED WITH: CHRISTINE HINSHAW @ [phone] ON [id number] BY [place] FAXED TO [place] TO [place] 01/27/15 BY LYONK Note: Gram Stain Report Called to,Read Back By and Verified With: Sherri Rad RN on 01/24/15 at 06:55 by Rise Mu Performed at  [facility]    Report Status 01/28/2015 FINAL  Final  Culture, blood (routine x 2)     Status: None   Collection Time: 01/23/15 12:30 PM  Result Value Ref Range Status   Specimen Description BLOOD LEFT HAND  Final   Special Requests BOTTLES DRAWN AEROBIC ONLY 5CC  Final   Culture   Final    NO GROWTH 5 DAYS Performed at  [facility]    Report Status 01/29/2015 FINAL  Final  Clostridium Difficile by PCR     Status: None   Collection Time: 01/24/15 12:59 PM  Result Value Ref Range Status   C difficile by pcr NEGATIVE NEGATIVE Final  Culture, Urine     Status: None   Collection Time: 01/24/15  2:02 PM  Result Value Ref Range Status   Specimen Description URINE, CATHETERIZED  Final   Special Requests NONE  Final   Colony Count NO GROWTH Performed at [facility]   Final   Culture NO GROWTH Performed at [facility]   Final   Report Status 01/26/2015 FINAL  Final  MRSA PCR Screening     Status: None   Collection Time: 01/24/15  4:30 PM  Result Value Ref Range Status   MRSA by PCR NEGATIVE NEGATIVE Final    Comment:        The GeneXpert MRSA Assay (FDA approved for NASAL specimens only), is one component of a comprehensive MRSA colonization surveillance program. It is not intended to diagnose MRSA infection nor to guide or monitor treatment for MRSA infections.     Studies/Results: Dg Chest Port 1 View  01/28/2015   CLINICAL DATA:  Central line placement.  EXAM: PORTABLE CHEST - 1 VIEW  COMPARISON:  01/28/2015.  FINDINGS: The right IJ central venous catheter tip is in the proximal SVC above the level of the carina. No complicating features are demonstrated. Persistent bibasilar atelectasis. Possible small right effusion.  IMPRESSION: Right IJ central venous catheter tip is in the proximal SVC. No complicating features.  Persistent bibasilar atelectasis and possible small right effusion.   Electronically Signed   By: Marijo Sanes M.D.   On:  01/28/2015 15:25   Dg Chest Port 1 View  01/28/2015   CLINICAL DATA:  Acute respiratory failure.  EXAM: PORTABLE CHEST - 1 VIEW  COMPARISON:  January 27, 2015.  FINDINGS: Stable cardiomediastinal silhouette. No pneumothorax is noted. Left lung is clear. New right basilar opacity is noted concerning for pneumonia or atelectasis, with possible associated pleural effusion. Left-sided PICC line is noted with distal tip overlying expected position of the SVC.  IMPRESSION: New right basilar opacity is noted concerning for pneumonia or atelectasis with associated pleural effusion. Followup radiographs are recommended.   Electronically Signed   By: Marijo Conception, M.D.   On: 01/28/2015 08:26      Assessment/Plan:  Principal Problem:   Atrial fibrillation with RVR Active Problems:   Palpitations   Upper respiratory disease   Dehydration   Breast pain, right   A-fib   Fever   Nausea & vomiting   Sepsis   Acute renal insufficiency   Abdominal pain   Axillary pain   Dyspnea   Hypoxia   Thrombocytopenia   Transaminitis   Hyperbilirubinemia   FUO (fever of unknown origin)   Septic shock   AKI (acute kidney injury)   Cholecystitis   DIC (disseminated intravascular coagulation)   Group A streptococcal infection   Cellulitis   D-dimer, elevated   Severe sepsis with septic shock   Acute respiratory failure   HSV-1 (herpes simplex virus 1) infection    Amanda Wilkins is a 69 y.o. female with GAS bacteremia and septic/toxic shock with erisipelas,  Strawberry tongue   #1 Severe Sepsis with shock due to GAS.   Lesions on her flanks seem to be regressing she had one new lesion on her back discovered 2 days  ago that is stable   I had  ordered MRI chest, abdomen to look for abscess, evidence of fascitis or something that would require surgery but pt required HD placement yesterday and study done today, read pending  Son and Dr. Joya Gaskins had made me aware of grandchildren who had  impetigo that was likely source of pts severe GAS  ceftriaxone once daily for a bactericidal beta-lactam for group A strep Continue clindamycin for inhibition of ribosomal production of toxins  #2 Shoulder pain Better today but will get MRI of shoulder during this admission    #3 Gallstones: sp HIDA, biliary system does not seen to be the nidus of her infection now that she is had clear-cut declaration of her group A strep bacteremia and cellulitis soft tissue infection   #4 thrombocytopenia: Likely from DIC and improving  #5  renal failure multifactorial with sepsis NSAIDs and vancomycin the role of the picture , now on HD  #6 Respiratory failuire due to volume overload and need for HD  #7 HSV1 eruption: could consider adding valtrex  IF RENAL OK with this      LOS: 6 days   Alcide Evener 01/29/2015, 12:41 PM

## 2015-01-29 NOTE — Progress Notes (Signed)
Assessment:  1 Nonoliguric worsening AKI, hemodynamically mediated due to afib, hypotension, bacteremia, COX-2 inhib. Mild uremia       S/p 4000cc off with HD yesterday and increased UOP 2 Group A strep 1/2 pos BC 3 Sepsis, improving 4 Acidosis, better 5 Hyponatremia, better 6 Hypokalemia, resolved  Plan:  Do another HD today for volume  Subjective: Interval History:HD yesterday was tolerated  Objective: Vital signs in last 24 hours: Temp:  [97.3 F (36.3 C)-97.9 F (36.6 C)] 97.9 F (36.6 C) (04/16 1238) Pulse Rate:  [25-139] 107 (04/16 1238) Resp:  [13-22] 15 (04/16 1238) BP: (87-166)/(48-102) 110/58 mmHg (04/16 1238) SpO2:  [87 %-100 %] 91 % (04/16 1323) Weight:  [110.6 kg (243 lb 13.3 oz)] 110.6 kg (243 lb 13.3 oz) (04/16 0500) Weight change: -3.5 kg (-7 lb 11.5 oz)  Intake/Output from previous day: 04/15 0701 - 04/16 0700 In: 1200 [P.O.:600; I.V.:430; IV Piggyback:170] Out: 5700 [Urine:1700] Intake/Output this shift: Total I/O In: 63 [I.V.:63] Out: 400 [Urine:400] PE remains with anasarca but less c/w yesterday Wheezing Alert and appropriate  Lab Results:  Recent Labs  01/28/15 0445 01/29/15 0713  WBC 13.1* 11.8*  HGB 9.4* 9.7*  HCT 26.0* 27.0*  PLT 105* 140*   BMET:  Recent Labs  01/28/15 0445 01/29/15 0713  NA 126* 130*  K 3.2* 3.6  CL 89* 90*  CO2 21 24  GLUCOSE 143* 149*  BUN 82* 48*  CREATININE 5.21* 3.82*  CALCIUM 7.9* 7.6*   No results for input(s): PTH in the last 72 hours. Iron Studies: No results for input(s): IRON, TIBC, TRANSFERRIN, FERRITIN in the last 72 hours. Studies/Results: Mr Abdomen Wo Contrast  01/29/2015   CLINICAL DATA:  Septic shock. Evaluate subcutaneous soft tissue swelling/ edema involving the abdominal wall.  EXAM: MRI ABDOMEN WITHOUT CONTRAST  TECHNIQUE: Multiplanar multisequence MR imaging was performed without the administration of intravenous contrast.  COMPARISON:  CT scan 01/24/2015  FINDINGS: There is  diffuse edema like signal abnormality in the subcutaneous fat involving the entire abdominal wall more significantly on the right side. I do not see a discrete drainable fluid collection to suggest an abscess. No definite involvement of the abdominal muscles. There is however involvement of the latissimus dorsi muscle on the right side.  Intra-abdominal findings include bowel dilatation and cholelithiasis. No large intra-abdominal fluid collections are identified.  IMPRESSION: Diffuse subcutaneous soft tissue swelling/ edema/fluid likely reflecting cellulitis. There is also myositis involving the right latissimus dorsi muscle. No focal drainable fluid collection/abscess is identified.  Bowel dilatation is noted. No significant intra abdominal fluid collection is identified.  Gallbladder distention and cholelithiasis.   Electronically Signed   By: Marijo Sanes M.D.   On: 01/29/2015 12:56   Dg Chest Port 1 View  01/28/2015   CLINICAL DATA:  Central line placement.  EXAM: PORTABLE CHEST - 1 VIEW  COMPARISON:  01/28/2015.  FINDINGS: The right IJ central venous catheter tip is in the proximal SVC above the level of the carina. No complicating features are demonstrated. Persistent bibasilar atelectasis. Possible small right effusion.  IMPRESSION: Right IJ central venous catheter tip is in the proximal SVC. No complicating features.  Persistent bibasilar atelectasis and possible small right effusion.   Electronically Signed   By: Marijo Sanes M.D.   On: 01/28/2015 15:25   Dg Chest Port 1 View  01/28/2015   CLINICAL DATA:  Acute respiratory failure.  EXAM: PORTABLE CHEST - 1 VIEW  COMPARISON:  January 27, 2015.  FINDINGS: Stable  cardiomediastinal silhouette. No pneumothorax is noted. Left lung is clear. New right basilar opacity is noted concerning for pneumonia or atelectasis, with possible associated pleural effusion. Left-sided PICC line is noted with distal tip overlying expected position of the SVC.  IMPRESSION:  New right basilar opacity is noted concerning for pneumonia or atelectasis with associated pleural effusion. Followup radiographs are recommended.   Electronically Signed   By: Marijo Conception, M.D.   On: 01/28/2015 08:26      LOS: 6 days   Franklin Baumbach C 01/29/2015,2:01 PM

## 2015-01-30 DIAGNOSIS — M25519 Pain in unspecified shoulder: Secondary | ICD-10-CM | POA: Diagnosis present

## 2015-01-30 LAB — CBC
HCT: 26.9 % — ABNORMAL LOW (ref 36.0–46.0)
Hemoglobin: 9.5 g/dL — ABNORMAL LOW (ref 12.0–15.0)
MCH: 33.8 pg (ref 26.0–34.0)
MCHC: 35.3 g/dL (ref 30.0–36.0)
MCV: 95.7 fL (ref 78.0–100.0)
Platelets: 162 10*3/uL (ref 150–400)
RBC: 2.81 MIL/uL — ABNORMAL LOW (ref 3.87–5.11)
RDW: 14.2 % (ref 11.5–15.5)
WBC: 10.6 10*3/uL — ABNORMAL HIGH (ref 4.0–10.5)

## 2015-01-30 LAB — RENAL FUNCTION PANEL
ALBUMIN: 2.2 g/dL — AB (ref 3.5–5.2)
ANION GAP: 11 (ref 5–15)
BUN: 24 mg/dL — ABNORMAL HIGH (ref 6–23)
CO2: 26 mmol/L (ref 19–32)
CREATININE: 2.68 mg/dL — AB (ref 0.50–1.10)
Calcium: 7.8 mg/dL — ABNORMAL LOW (ref 8.4–10.5)
Chloride: 96 mmol/L (ref 96–112)
GFR calc Af Amer: 20 mL/min — ABNORMAL LOW (ref >90)
GFR calc non Af Amer: 17 mL/min — ABNORMAL LOW (ref >90)
Glucose, Bld: 113 mg/dL — ABNORMAL HIGH (ref 70–99)
PHOSPHORUS: 4.6 mg/dL (ref 2.3–4.6)
POTASSIUM: 3.8 mmol/L (ref 3.5–5.1)
SODIUM: 133 mmol/L — AB (ref 135–145)

## 2015-01-30 MED ORDER — FUROSEMIDE 10 MG/ML IJ SOLN
160.0000 mg | Freq: Three times a day (TID) | INTRAMUSCULAR | Status: DC
  Administered 2015-01-30 – 2015-02-01 (×6): 160 mg via INTRAVENOUS
  Filled 2015-01-30 (×10): qty 16

## 2015-01-30 MED ORDER — LEVALBUTEROL HCL 0.63 MG/3ML IN NEBU
0.6300 mg | INHALATION_SOLUTION | Freq: Two times a day (BID) | RESPIRATORY_TRACT | Status: DC
Start: 2015-01-31 — End: 2015-02-02
  Administered 2015-01-31 – 2015-02-02 (×4): 0.63 mg via RESPIRATORY_TRACT
  Filled 2015-01-30 (×9): qty 3

## 2015-01-30 NOTE — Progress Notes (Signed)
Primary cardiologist: Dr. [name]  Seen for followup: Persistent atrial fibrillation  Subjective:    Feels somewhat better today. Tolerated hemodialysis yesterday. No chest pain or feelings of palpitations.  Objective:   Temp:  [97.9 F (36.6 C)-98.9 F (37.2 C)] 98.9 F (37.2 C) (04/17 0842) Pulse Rate:  [83-128] 83 (04/17 0842) Resp:  [13-25] 13 (04/17 0842) BP: (96-139)/(51-85) 105/51 mmHg (04/17 0842) SpO2:  [91 %-100 %] 96 % (04/17 0842) Weight:  [238 lb 8.6 oz (108.2 kg)-246 lb 14.6 oz (112 kg)] 238 lb 8.6 oz (108.2 kg) (04/17 0500) Last BM Date: 01/24/15  Filed Weights   01/29/15 0500 01/29/15 1450 01/30/15 0500  Weight: 243 lb 13.3 oz (110.6 kg) 246 lb 14.6 oz (112 kg) 238 lb 8.6 oz (108.2 kg)    Intake/Output Summary (Last 24 hours) at 01/30/15 1138 Last data filed at 01/30/15 0600  Gross per 24 hour  Intake    190 ml  Output   3400 ml  Net  -3210 ml    Telemetry: Atrial fibrillation with RVR.  Exam:  General: Obese, chronically ill-appearing woman.  Lungs: Coarse breath sounds.  Cardiac: Distant, irregularly irregular.  Abdomen: NABS.  Extremities: Mild leg edema.  Lab Results:  Basic Metabolic Panel:  Recent Labs Lab 01/26/15 0400 01/27/15 0420 01/28/15 0445 01/29/15 0713 01/30/15 0500  NA 126* 128* 126* 130* 133*  K 3.0* 3.6 3.2* 3.6 3.8  CL 94* 94* 89* 90* 96  CO2 15* 19 21 24 26   GLUCOSE 111* 123* 143* 149* 113*  BUN 79* 83* 82* 48* 24*  CREATININE 4.66* 5.00* 5.21* 3.82* 2.68*  CALCIUM 7.4* 7.8* 7.9* 7.6* 7.8*  MG 2.4 2.6*  --   --   --     CBC:  Recent Labs Lab 01/28/15 0445 01/29/15 0713 01/30/15 0030  WBC 13.1* 11.8* 10.6*  HGB 9.4* 9.7* 9.5*  HCT 26.0* 27.0* 26.9*  MCV 92.2 94.4 95.7  PLT 105* 140* 162    Cardiac Enzymes:  Recent Labs Lab 01/23/15 1230  TROPONINI <0.03    Echocardiogram 01/25/2015: Study Conclusions  - Left ventricle: The cavity size was normal. Wall thickness was normal.  Systolic function was normal. The estimated ejection fraction was in the range of 55% to 60%. Wall motion was normal; there were no regional wall motion abnormalities. - Mitral valve: Calcified annulus. There was mild regurgitation. - Left atrium: The atrium was mildly dilated. - Pulmonary arteries: Systolic pressure was mildly increased. PA peak pressure: 32 mm Hg (S).  Impressions:  - Normal LV function; mild LAE; mild MR and TR; mildly elevated pulmonary pressure.   Medications:   Scheduled Medications: . antiseptic oral rinse  7 mL Mouth Rinse q12n4p  . aspirin  325 mg Oral Daily  . cefTRIAXone (ROCEPHIN)  IV  2 g Intravenous Q24H  . chlorhexidine  15 mL Mouth Rinse BID  . chlorpheniramine-HYDROcodone  5 mL Oral Q12H  . clindamycin (CLEOCIN) IV  900 mg Intravenous 3 times per day  . estrogens (conjugated)  0.3 mg Oral Q48H  . famotidine  20 mg Oral QHS  . furosemide  160 mg Intravenous 3 times per day  . heparin  5,000 Units Subcutaneous 3 times per day  . HYDROcodone-homatropine  5 mL Oral Once  . levalbuterol  0.63 mg Nebulization Q6H  . LORazepam  1 mg Oral Once  . sertraline  50 mg Oral Daily  . sodium chloride  3 mL Intravenous Q12H  . traMADol  100 mg Oral Q12H    Infusions: . diltiazem (CARDIZEM) infusion 10 mg/hr (01/30/15 0515)    PRN Medications: acetaminophen **OR** acetaminophen, guaifenesin, HYDROcodone-homatropine, loperamide, ondansetron **OR** ondansetron (ZOFRAN) IV, sodium chloride   Assessment:   1. Persistent atrial fibrillation with RVR, continues on intravenous diltiazem. She has a history of paroxysmal atrial fibrillation, has been on aspirin and Cardizem CD as an outpatient. CHADSVASC score is 2.  2. Septic shock with GHS bacteremia and erysipelas, followed by infectious disease service.  3. Cholelithiasis, not entirely clear that this is clinically active at this time.  4. Thrombocytopenia, improving, platelets of 162.  5. Acute  renal failure, requiring hemodialysis at this time. Followed by nephrology. Creatinine down to 2.6. Lasix advanced.  6. Chart review finds abnormal Myoview from March of this year indicating area of inferior/inferolateral ischemia with normal LVEF. This has not been further investigated as yet. Chart notes indicate a history of dyspnea on exertion. Cardiac enzymes negative during this hospitalization.   Plan/Discussion:    Continue aspirin and intravenous diltiazem. Defer initiating anticoagulation (possibly NOAC) until course of comorbid illness more stable. Do not feel strongly about initiating intravenous heparin in the short-term in light of higher bleeding risk. As far as the recent abnormal Myoview is concerned from March, this will eventually need to be pursued further, although now is not time with her acute comorbidities.   Satira Sark, M.D., F.A.C.C.

## 2015-01-30 NOTE — Progress Notes (Signed)
Patient has refused the CPAP machine for tonight. Patient states she does not wear one at home and she sleeps better without it. CPAP is at bedside, ready to use if patient changes her mind. Patient is currently on [place], in no apparent distress. RT will continue to assist as needed.

## 2015-01-30 NOTE — Progress Notes (Signed)
[facility] TEAM 1 - Stepdown/ICU TEAM Progress Note  Amanda Wilkins RCV:[id number] DOB: 11-30-1945 DOA: 01/22/2015 PCP: [name], MD  Admit HPI / Brief Narrative: 69 yo F w/ h/o pafib and htn who presented w/ 2 days of n/v, general malaise, diarrhea, and fever. When she started having palpitations she came to ED. She had vomited several times and was unable to hold down her home meds.  Significant Events: 4/09 Admit with N/V/D - lactic acid 3.6 4/10 CT Chest > inflammation in R axilla extending inferiorly along R chest, no focal evidence of abscess or gas, mild dependent atx 4/11 CT Abd/Pelvis > no renal obs, Gallbladder distention with possible internal gallstone identified 4/11 Renal US > no hydronephrosis, cholelithiasis, small L pleural effusion  4/11 PCCM consulted for sepsis. Lactic acid 1.4 4/12 Hypotensive on dilt gtt, tachy on dopamine gtt, some resp distress with fluids 4/12 LE Duplex > neg for DVT 4/12 RUE Duplex > neg for DVT 4/13 Off pressors 4/13 HIDA Scan > delayed filling of gallbladder, but the gallbladder fills after morphine administration, no evidence of biliary obstruction 4/14 Desaturated into mid 80's on RA while sleeping  HPI/Subjective: The patient is resting comfortable in the present time.  She states her only complaint at this time is "being very tired".  She denies chest pain shortness breath fevers or chills.  She states the pain in her right shoulder is much improved.  Assessment/Plan:  Septic shock due to Group A strep cellulitis / chest wall induration w/ bacteremia  ID following - MRI did not reveal a focal pocket of infection/abscess - abx per ID Team - pt has noted shoulder pain worrisome for septic arthritis - ID has ordered MRI of shoulder but pt has decided she does not want this done today - I have counseled her on the importance of this test and the need to accomplish it sooner than later   ?Viral Gastroenteritis -  doubt Suspect her initial GI symptoms were likely due to her sepsis/severe cellulitis - symptoms improved at present  Persistent dependent Atelectasis  Mobilize as able - incentive spirometer  Acute pulm edema medical management has been difficult due to acute kidney failure - Nephrology initiated hemodialysis 4/15 and patient is tolerating well thus far  Persistent Atrial Fibrillation  Cardiology is following - rate remains reasonably controlled - Cardiology team now suggests holding anticoagulation in the acute term  Acute Kidney Injury  hemodynamically mediated due to afib, hypotension, bacteremia, COX-2 inhib - hemodialysis has been initiated w/ 7L off in last 48hrs - Nephrology continues to follow - UOP picking up   HSV1 exacerbation Appears to be much improved today - will not initiate therapy at this time  Hyponatremia due to kidney failure - improving with dialysis  Thrombocytopenia Probable DIC - platelet count climbing consistently / essentially resolved  Hyperglycemia previous A1c's have been normal - recheck pending  Abnormal Myoview  Obtained in March 2016 - Cards noted further eval will be required when more stable - patient is asymptomatic presently  Obesity - Body mass index is 38.52 kg/(m^2).  High D-dimer doubt PE, will not scan given renal function - venous duplex neg for DVT - will be on anticoagulation for atrial fibrillation nonetheless  Suspected OSA Saturations remaining stable - continue to wean oxygen as able - cont nocturnal CPAP   Code Status: FULL Family Communication: Spoke with husband at bedside Disposition Plan: SDU  Consultants: Cardiology  ID Nephrology   Antibiotics: Ceftriaxone 4/13 > Clindamycin  4/12 > 4/16  DVT prophylaxis: Subcutaneous heparin  Objective: Blood pressure 117/53, pulse 115, temperature 98.1 F (36.7 C), temperature source Oral, resp. rate 14, height 5\' 6"  (1.676 m), weight 108.2 kg (238 lb 8.6 oz), SpO2 95  %.  Intake/Output Summary (Last 24 hours) at 01/30/15 1526 Last data filed at 01/30/15 1000  Gross per 24 hour  Intake    153 ml  Output   3400 ml  Net  -3247 ml   Exam: General: No acute respiratory distress sitting in bedside chair - alert and oriented Lungs: poor air movement throughout all fields with bibasilar crackles but no wheeze  Cardiovascular: distant heart sounds, regular rate, no appreciable murmur  Abdomen: Nontender, obese, soft, bowel sounds positive, no rebound, no ascites, no appreciable mass Extremities: No significant cyanosis, clubbing; 1+ edema bilateral lower extremities   Data Reviewed: Basic Metabolic Panel:  Recent Labs Lab 01/26/15 0400 01/27/15 0420 01/28/15 0445 01/29/15 0713 01/30/15 0500  NA 126* 128* 126* 130* 133*  K 3.0* 3.6 3.2* 3.6 3.8  CL 94* 94* 89* 90* 96  CO2 15* 19 21 24 26   GLUCOSE 111* 123* 143* 149* 113*  BUN 79* 83* 82* 48* 24*  CREATININE 4.66* 5.00* 5.21* 3.82* 2.68*  CALCIUM 7.4* 7.8* 7.9* 7.6* 7.8*  MG 2.4 2.6*  --   --   --   PHOS 5.6* 5.3*  --   --  4.6    Liver Function Tests:  Recent Labs Lab 01/24/15 0500 01/25/15 0500 01/29/15 0713 01/30/15 0500  AST 54* 38* 35  --   ALT 39* 37* 41*  --   ALKPHOS 38* 41 109  --   BILITOT 1.4* 1.2 0.6  --   PROT 5.0* 5.0* 5.7*  --   ALBUMIN 2.2* 2.0* 2.2* 2.2*    Coags:  Recent Labs Lab 01/24/15 2043  INR 1.18    Recent Labs Lab 01/24/15 2043  APTT 35    CBC:  Recent Labs Lab 01/24/15 2043  01/26/15 0400 01/27/15 0420 01/28/15 0445 01/29/15 0713 01/30/15 0030  WBC 9.0  < > 12.5* 14.0* 13.1* 11.8* 10.6*  NEUTROABS 8.0*  --   --   --   --   --   --   HGB 11.6*  < > 10.5* 9.9* 9.4* 9.7* 9.5*  HCT 32.4*  < > 28.6* 26.8* 26.0* 27.0* 26.9*  MCV 94.5  < > 92.9 90.8 92.2 94.4 95.7  PLT 73*  74*  < > 67* 81* 105* 140* 162  < > = values in this interval not displayed.  CBG:  Recent Labs Lab 01/24/15 2237 01/26/15 1607 01/28/15 0755 01/28/15 1139   GLUCAP 89 128* 132* 132*    Recent Results (from the past 240 hour(s))  Culture, blood (routine x 2)     Status: None   Collection Time: 01/23/15 12:23 PM  Result Value Ref Range Status   Specimen Description BLOOD RIGHT HAND  Final   Special Requests BOTTLES DRAWN AEROBIC ONLY 4CC  Final   Culture   Final    GROUP A STREP (S.PYOGENES) ISOLATED Note: CRITICAL RESULT CALLED TO, READ BACK BY AND VERIFIED WITH: CHRISTINE HINSHAW @ [id number] ON [id number] BY [place] FAXED TO [facility] TO [place] 01/27/15 BY LYONK Note: Gram Stain Report Called to,Read Back By and Verified With: Sherri Rad RN on 01/24/15 at 06:55 by Rise Mu Performed at [facility]    Report Status 01/28/2015 FINAL  Final  Culture, blood (routine  x 2)     Status: None   Collection Time: 01/23/15 12:30 PM  Result Value Ref Range Status   Specimen Description BLOOD LEFT HAND  Final   Special Requests BOTTLES DRAWN AEROBIC ONLY 5CC  Final   Culture   Final    NO GROWTH 5 DAYS Performed at [facility]    Report Status 01/29/2015 FINAL  Final  Clostridium Difficile by PCR     Status: None   Collection Time: 01/24/15 12:59 PM  Result Value Ref Range Status   C difficile by pcr NEGATIVE NEGATIVE Final  Culture, Urine     Status: None   Collection Time: 01/24/15  2:02 PM  Result Value Ref Range Status   Specimen Description URINE, CATHETERIZED  Final   Special Requests NONE  Final   Colony Count NO GROWTH Performed at [facility]   Final   Culture NO GROWTH Performed at [facility]   Final   Report Status 01/26/2015 FINAL  Final  MRSA PCR Screening     Status: None   Collection Time: 01/24/15  4:30 PM  Result Value Ref Range Status   MRSA by PCR NEGATIVE NEGATIVE Final    Comment:        The GeneXpert MRSA Assay (FDA approved for NASAL specimens only), is one component of a comprehensive MRSA colonization surveillance program. It is not intended to diagnose MRSA infection  nor to guide or monitor treatment for MRSA infections.     Studies:   Recent x-ray studies have been reviewed in detail by the Attending Physician  Scheduled Meds:  Scheduled Meds: . aspirin  325 mg Oral Daily  . cefTRIAXone (ROCEPHIN)  IV  2 g Intravenous Q24H  . chlorpheniramine-HYDROcodone  5 mL Oral Q12H  . estrogens (conjugated)  0.3 mg Oral Q48H  . famotidine  20 mg Oral QHS  . furosemide  160 mg Intravenous 3 times per day  . heparin  5,000 Units Subcutaneous 3 times per day  . HYDROcodone-homatropine  5 mL Oral Once  . levalbuterol  0.63 mg Nebulization Q6H  . LORazepam  1 mg Oral Once  . sertraline  50 mg Oral Daily  . sodium chloride  3 mL Intravenous Q12H  . traMADol  100 mg Oral Q12H    Time spent on care of this patient: 35 mins   MCCLUNG,JEFFREY T , MD   [facility] Office  [phone] Pager - Text Page per Shea Evans as per below:  On-Call/Text Page:      [email or web address]      password [email or web address]  If 7PM-7AM, please contact night-coverage [email or web address] Password [email or web address] 01/30/2015, 3:26 PM   LOS: 7 days

## 2015-01-30 NOTE — Progress Notes (Signed)
Assessment: 1 Nonoliguric worsening AKI, hemodynamically mediated due to afib, hypotension, bacteremia, COX-2 inhib. Mild uremia     improved s/p 7000cc off with HD in 2 days.  Will try furosemide with increased UOP 2 Group A strep 1/2 pos BC 3 Sepsis, improving 4 Acidosis, better 5 Hyponatremia, better 6 Hypokalemia, resolved  Plan: Furosemide IV  Subjective: Interval History: 3000cc off with HD yesterday, appetite better  Objective: Vital signs in last 24 hours: Temp:  [97.9 F (36.6 C)-98.9 F (37.2 C)] 98.9 F (37.2 C) (04/17 0842) Pulse Rate:  [83-128] 83 (04/17 0842) Resp:  [13-25] 13 (04/17 0842) BP: (96-139)/(51-85) 105/51 mmHg (04/17 0842) SpO2:  [91 %-100 %] 96 % (04/17 0842) Weight:  [108.2 kg (238 lb 8.6 oz)-112 kg (246 lb 14.6 oz)] 108.2 kg (238 lb 8.6 oz) (04/17 0500) Weight change: 1.4 kg (3 lb 1.4 oz)  Intake/Output from previous day: 04/16 0701 - 04/17 0700 In: 233 [I.V.:233] Out: 3800 [Urine:800] Intake/Output this shift:    General appearance: alert, cooperative and cold sore bottom lip Breasts:tender upper outer region Extremities: 2+ bilat Lab Results:  Recent Labs  01/29/15 0713 01/30/15 0030  WBC 11.8* 10.6*  HGB 9.7* 9.5*  HCT 27.0* 26.9*  PLT 140* 162   BMET:  Recent Labs  01/29/15 0713 01/30/15 0500  NA 130* 133*  K 3.6 3.8  CL 90* 96  CO2 24 26  GLUCOSE 149* 113*  BUN 48* 24*  CREATININE 3.82* 2.68*  CALCIUM 7.6* 7.8*   No results for input(s): PTH in the last 72 hours. Iron Studies: No results for input(s): IRON, TIBC, TRANSFERRIN, FERRITIN in the last 72 hours. Studies/Results: Mr Abdomen Wo Contrast  01/29/2015   CLINICAL DATA:  Septic shock. Evaluate subcutaneous soft tissue swelling/ edema involving the abdominal wall.  EXAM: MRI ABDOMEN WITHOUT CONTRAST  TECHNIQUE: Multiplanar multisequence MR imaging was performed without the administration of intravenous contrast.  COMPARISON:  CT scan 01/24/2015  FINDINGS: There is  diffuse edema like signal abnormality in the subcutaneous fat involving the entire abdominal wall more significantly on the right side. I do not see a discrete drainable fluid collection to suggest an abscess. No definite involvement of the abdominal muscles. There is however involvement of the latissimus dorsi muscle on the right side.  Intra-abdominal findings include bowel dilatation and cholelithiasis. No large intra-abdominal fluid collections are identified.  IMPRESSION: Diffuse subcutaneous soft tissue swelling/ edema/fluid likely reflecting cellulitis. There is also myositis involving the right latissimus dorsi muscle. No focal drainable fluid collection/abscess is identified.  Bowel dilatation is noted. No significant intra abdominal fluid collection is identified.  Gallbladder distention and cholelithiasis.   Electronically Signed   By: Marijo Sanes M.D.   On: 01/29/2015 12:56   Dg Chest Port 1 View  01/28/2015   CLINICAL DATA:  Central line placement.  EXAM: PORTABLE CHEST - 1 VIEW  COMPARISON:  01/28/2015.  FINDINGS: The right IJ central venous catheter tip is in the proximal SVC above the level of the carina. No complicating features are demonstrated. Persistent bibasilar atelectasis. Possible small right effusion.  IMPRESSION: Right IJ central venous catheter tip is in the proximal SVC. No complicating features.  Persistent bibasilar atelectasis and possible small right effusion.   Electronically Signed   By: Marijo Sanes M.D.   On: 01/28/2015 15:25   Scheduled: . antiseptic oral rinse  7 mL Mouth Rinse q12n4p  . aspirin  325 mg Oral Daily  . cefTRIAXone (ROCEPHIN)  IV  2 g  Intravenous Q24H  . chlorhexidine  15 mL Mouth Rinse BID  . chlorpheniramine-HYDROcodone  5 mL Oral Q12H  . clindamycin (CLEOCIN) IV  900 mg Intravenous 3 times per day  . estrogens (conjugated)  0.3 mg Oral Q48H  . famotidine  20 mg Oral QHS  . heparin  5,000 Units Subcutaneous 3 times per day  .  HYDROcodone-homatropine  5 mL Oral Once  . levalbuterol  0.63 mg Nebulization Q6H  . LORazepam  1 mg Oral Once  . sertraline  50 mg Oral Daily  . sodium chloride  3 mL Intravenous Q12H  . traMADol  100 mg Oral Q12H     LOS: 7 days   Kevyn Boquet C 01/30/2015,9:57 AM

## 2015-01-30 NOTE — Progress Notes (Signed)
[facility]        Subjective:  No new complaints.  Antibiotics:  Anti-infectives    Start     Dose/Rate Route Frequency Ordered Stop   01/26/15 1530  cefTRIAXone (ROCEPHIN) 2 g in dextrose 5 % 50 mL IVPB - Premix     2 g 100 mL/hr over 30 Minutes Intravenous Every 24 hours 01/26/15 1505     01/25/15 1200  vancomycin (VANCOCIN) 1,500 mg in sodium chloride 0.9 % 500 mL IVPB  Status:  Discontinued     1,500 mg 250 mL/hr over 120 Minutes Intravenous Every 48 hours 01/23/15 1417 01/25/15 1015   01/25/15 1100  clindamycin (CLEOCIN) IVPB 900 mg  Status:  Discontinued     900 mg 100 mL/hr over 30 Minutes Intravenous 3 times per day 01/25/15 1014 01/30/15 1310   01/24/15 1600  doxycycline (VIBRAMYCIN) 100 mg in dextrose 5 % 250 mL IVPB  Status:  Discontinued     100 mg 125 mL/hr over 120 Minutes Intravenous Every 12 hours 01/24/15 1500 01/25/15 1013   01/24/15 1400  piperacillin-tazobactam (ZOSYN) IVPB 2.25 g  Status:  Discontinued     2.25 g 100 mL/hr over 30 Minutes Intravenous 4 times per day 01/24/15 1148 01/26/15 1505   01/24/15 1000  vancomycin (VANCOCIN) IVPB 1000 mg/200 mL premix  Status:  Discontinued     1,000 mg 200 mL/hr over 60 Minutes Intravenous Every 24 hours 01/23/15 0846 01/23/15 1417   01/23/15 2200  piperacillin-tazobactam (ZOSYN) IVPB 3.375 g  Status:  Discontinued     3.375 g 12.5 mL/hr over 240 Minutes Intravenous Every 8 hours 01/23/15 0846 01/24/15 1148   01/23/15 1000  oseltamivir (TAMIFLU) capsule 30 mg  Status:  Discontinued     30 mg Oral 2 times daily 01/23/15 0803 01/23/15 1413   01/23/15 0900  vancomycin (VANCOCIN) 2,000 mg in sodium chloride 0.9 % 500 mL IVPB     2,000 mg 250 mL/hr over 120 Minutes Intravenous  Once 01/23/15 0846 01/23/15 1401   01/23/15 0845  piperacillin-tazobactam (ZOSYN) IVPB 3.375 g     3.375 g 100 mL/hr over 30 Minutes Intravenous  Once 01/23/15 0846 01/23/15 2013      Medications: Scheduled  Meds: . antiseptic oral rinse  7 mL Mouth Rinse q12n4p  . aspirin  325 mg Oral Daily  . cefTRIAXone (ROCEPHIN)  IV  2 g Intravenous Q24H  . chlorhexidine  15 mL Mouth Rinse BID  . chlorpheniramine-HYDROcodone  5 mL Oral Q12H  . estrogens (conjugated)  0.3 mg Oral Q48H  . famotidine  20 mg Oral QHS  . furosemide  160 mg Intravenous 3 times per day  . heparin  5,000 Units Subcutaneous 3 times per day  . HYDROcodone-homatropine  5 mL Oral Once  . levalbuterol  0.63 mg Nebulization Q6H  . LORazepam  1 mg Oral Once  . sertraline  50 mg Oral Daily  . sodium chloride  3 mL Intravenous Q12H  . traMADol  100 mg Oral Q12H   Continuous Infusions: . diltiazem (CARDIZEM) infusion 10 mg/hr (01/30/15 0515)   PRN Meds:.acetaminophen **OR** acetaminophen, guaifenesin, HYDROcodone-homatropine, loperamide, ondansetron **OR** ondansetron (ZOFRAN) IV, sodium chloride    Objective: Weight change: 3 lb 1.4 oz (1.4 kg)  Intake/Output Summary (Last 24 hours) at 01/30/15 1311 Last data filed at 01/30/15 0600  Gross per 24 hour  Intake    170 ml  Output   3400 ml  Net  -3230 ml  Blood pressure 117/53, pulse 115, temperature 98.1 F (36.7 C), temperature source Oral, resp. rate 14, height 5\' 6"  (1.676 m), weight 238 lb 8.6 oz (108.2 kg), SpO2 98 %. Temp:  [97.9 F (36.6 C)-98.9 F (37.2 C)] 98.1 F (36.7 C) (04/17 1222) Pulse Rate:  [83-128] 115 (04/17 1222) Resp:  [13-25] 14 (04/17 1222) BP: (96-139)/(51-85) 117/53 mmHg (04/17 1222) SpO2:  [91 %-100 %] 98 % (04/17 1222) Weight:  [238 lb 8.6 oz (108.2 kg)-246 lb 14.6 oz (112 kg)] 238 lb 8.6 oz (108.2 kg) (04/17 0500)  Physical Exam: General: Alert and awake, preparing for HD HEENT: anicteric sclera, pupils reactive to light and accommodation, EOMI  ENT:" strawberry tongue" and HSV lesions on lips  01/30/15:       01/29/15:    01/28/15        CVS tachy rate, irr, irr  no murmur rubs or gallops Chest: clear to auscultation  bilaterally, no wheezing, rales or rhonchi Abdomen: soft TTP in RUQ, no rebound  Lymph/skin new progressive almost bullous cellulitis extending down flank (which is NOT where she initially presented with pain   Left axilla, breast 01/24/15    01/26/15:      Flank 01/26/15:     01/27/15:      01/28/15:      01/29/15:     01/30/15:      Back 01/27/15:    01/28/15:     01/29/15:     01/30/15:      Neuro: nonfocal  CBC:  CBC Latest Ref Rng 01/30/2015 01/29/2015 01/28/2015  WBC 4.0 - 10.5 K/uL 10.6(H) 11.8(H) 13.1(H)  Hemoglobin 12.0 - 15.0 g/dL 9.5(L) 9.7(L) 9.4(L)  Hematocrit 36.0 - 46.0 % 26.9(L) 27.0(L) 26.0(L)  Platelets 150 - 400 K/uL 162 140(L) 105(L)       BMET  Recent Labs  01/29/15 0713 01/30/15 0500  NA 130* 133*  K 3.6 3.8  CL 90* 96  CO2 24 26  GLUCOSE 149* 113*  BUN 48* 24*  CREATININE 3.82* 2.68*  CALCIUM 7.6* 7.8*     Liver Panel   Recent Labs  01/29/15 0713 01/30/15 0500  PROT 5.7*  --   ALBUMIN 2.2* 2.2*  AST 35  --   ALT 41*  --   ALKPHOS 109  --   BILITOT 0.6  --        Sedimentation Rate No results for input(s): ESRSEDRATE in the last 72 hours. C-Reactive Protein No results for input(s): CRP in the last 72 hours.  Micro Results: Recent Results (from the past 720 hour(s))  Culture, blood (routine x 2)     Status: None   Collection Time: 01/23/15 12:23 PM  Result Value Ref Range Status   Specimen Description BLOOD RIGHT HAND  Final   Special Requests BOTTLES DRAWN AEROBIC ONLY 4CC  Final   Culture   Final    GROUP A STREP (S.PYOGENES) ISOLATED Note: CRITICAL RESULT CALLED TO, READ BACK BY AND VERIFIED WITH: CHRISTINE HINSHAW @ 9509 ON [id number] BY [place] FAXED TO [place] [facility] TO [place] 01/27/15 BY LYONK Note: Gram Stain Report Called to,Read Back By and Verified With: Sherri Rad RN on 01/24/15 at 06:55 by Rise Mu Performed at [facility]    Report Status 01/28/2015 FINAL   Final  Culture, blood (routine x 2)     Status: None   Collection Time: 01/23/15 12:30 PM  Result Value Ref Range Status   Specimen Description BLOOD LEFT HAND  Final  Special Requests BOTTLES DRAWN AEROBIC ONLY 5CC  Final   Culture   Final    NO GROWTH 5 DAYS Performed at [facility]    Report Status 01/29/2015 FINAL  Final  Clostridium Difficile by PCR     Status: None   Collection Time: 01/24/15 12:59 PM  Result Value Ref Range Status   C difficile by pcr NEGATIVE NEGATIVE Final  Culture, Urine     Status: None   Collection Time: 01/24/15  2:02 PM  Result Value Ref Range Status   Specimen Description URINE, CATHETERIZED  Final   Special Requests NONE  Final   Colony Count NO GROWTH Performed at [facility]   Final   Culture NO GROWTH Performed at [facility]   Final   Report Status 01/26/2015 FINAL  Final  MRSA PCR Screening     Status: None   Collection Time: 01/24/15  4:30 PM  Result Value Ref Range Status   MRSA by PCR NEGATIVE NEGATIVE Final    Comment:        The GeneXpert MRSA Assay (FDA approved for NASAL specimens only), is one component of a comprehensive MRSA colonization surveillance program. It is not intended to diagnose MRSA infection nor to guide or monitor treatment for MRSA infections.     Studies/Results: Mr Abdomen Wo Contrast  01/29/2015   CLINICAL DATA:  Septic shock. Evaluate subcutaneous soft tissue swelling/ edema involving the abdominal wall.  EXAM: MRI ABDOMEN WITHOUT CONTRAST  TECHNIQUE: Multiplanar multisequence MR imaging was performed without the administration of intravenous contrast.  COMPARISON:  CT scan 01/24/2015  FINDINGS: There is diffuse edema like signal abnormality in the subcutaneous fat involving the entire abdominal wall more significantly on the right side. I do not see a discrete drainable fluid collection to suggest an abscess. No definite involvement of the abdominal muscles. There is however  involvement of the latissimus dorsi muscle on the right side.  Intra-abdominal findings include bowel dilatation and cholelithiasis. No large intra-abdominal fluid collections are identified.  IMPRESSION: Diffuse subcutaneous soft tissue swelling/ edema/fluid likely reflecting cellulitis. There is also myositis involving the right latissimus dorsi muscle. No focal drainable fluid collection/abscess is identified.  Bowel dilatation is noted. No significant intra abdominal fluid collection is identified.  Gallbladder distention and cholelithiasis.   Electronically Signed   By: Marijo Sanes M.D.   On: 01/29/2015 12:56   Dg Chest Port 1 View  01/28/2015   CLINICAL DATA:  Central line placement.  EXAM: PORTABLE CHEST - 1 VIEW  COMPARISON:  01/28/2015.  FINDINGS: The right IJ central venous catheter tip is in the proximal SVC above the level of the carina. No complicating features are demonstrated. Persistent bibasilar atelectasis. Possible small right effusion.  IMPRESSION: Right IJ central venous catheter tip is in the proximal SVC. No complicating features.  Persistent bibasilar atelectasis and possible small right effusion.   Electronically Signed   By: Marijo Sanes M.D.   On: 01/28/2015 15:25      Assessment/Plan:  Principal Problem:   Atrial fibrillation with RVR Active Problems:   Palpitations   Upper respiratory disease   Dehydration   Breast pain, right   A-fib   Fever   Nausea & vomiting   Sepsis   Acute renal insufficiency   Abdominal pain   Axillary pain   Dyspnea   Hypoxia   Thrombocytopenia   Transaminitis   Hyperbilirubinemia   FUO (fever of unknown origin)   Septic shock  AKI (acute kidney injury)   Cholecystitis   DIC (disseminated intravascular coagulation)   Group A streptococcal infection   Cellulitis   D-dimer, elevated   Severe sepsis with septic shock   Acute respiratory failure   HSV-1 (herpes simplex virus 1) infection   Gallstones    Amanda Wilkins is a 69 y.o. female with GAS bacteremia and septic/toxic shock with erisipelas,  Strawberry tongue   #1 Severe Sepsis with shock due to GAS.   Lesions on her flanks seem to be regressing she had one new lesion on her back discovered 2 days ago that is stable  MRI of abdomen shows no discrete abscess but some myositis involving latissimus dorsi muscle\   Son and Dr. Joya Gaskins had made me aware of grandchildren who had impetigo that was likely source of pts severe GAS  Will DC clindamycin for now and continue IV rocephin 2 grams dailyi  Plan Will be for 14 days  Of IV antibiotics, (through 02/06/15)--provided there is no deeper nidus of infection discovered ( I will check MRI of right shoulder as well)    #2 Shoulder pain  will get MRI of shoulder   #3 Gallstones: sp HIDA, biliary system does not seen to be the nidus of her infection now that she is had clear-cut declaration of her group A strep bacteremia and cellulitis soft tissue infection   #4 thrombocytopenia: Likely from DIC and improving  #5  renal failure multifactorial with sepsis NSAIDs and vancomycin the role of the picture , now on HD  #6 Respiratory failuire due to volume overload and need for HD  #7 HSV1 eruption: could consider adding valtrex  But seems to be improving  Dr. Linus Salmons to take over the service tomorrow.    LOS: 7 days   Alcide Evener 01/30/2015, 1:11 PM

## 2015-01-31 DIAGNOSIS — A46 Erysipelas: Secondary | ICD-10-CM

## 2015-01-31 DIAGNOSIS — B950 Streptococcus, group A, as the cause of diseases classified elsewhere: Secondary | ICD-10-CM

## 2015-01-31 DIAGNOSIS — R579 Shock, unspecified: Secondary | ICD-10-CM

## 2015-01-31 LAB — RENAL FUNCTION PANEL
ALBUMIN: 2.3 g/dL — AB (ref 3.5–5.2)
Anion gap: 12 (ref 5–15)
BUN: 33 mg/dL — ABNORMAL HIGH (ref 6–23)
CHLORIDE: 93 mmol/L — AB (ref 96–112)
CO2: 26 mmol/L (ref 19–32)
CREATININE: 3.45 mg/dL — AB (ref 0.50–1.10)
Calcium: 8.3 mg/dL — ABNORMAL LOW (ref 8.4–10.5)
GFR calc Af Amer: 15 mL/min — ABNORMAL LOW (ref >90)
GFR, EST NON AFRICAN AMERICAN: 13 mL/min — AB (ref >90)
Glucose, Bld: 130 mg/dL — ABNORMAL HIGH (ref 70–99)
Phosphorus: 5.9 mg/dL — ABNORMAL HIGH (ref 2.3–4.6)
Potassium: 3.9 mmol/L (ref 3.5–5.1)
Sodium: 131 mmol/L — ABNORMAL LOW (ref 135–145)

## 2015-01-31 LAB — CBC
HEMATOCRIT: 28.2 % — AB (ref 36.0–46.0)
Hemoglobin: 9.7 g/dL — ABNORMAL LOW (ref 12.0–15.0)
MCH: 33.7 pg (ref 26.0–34.0)
MCHC: 34.4 g/dL (ref 30.0–36.0)
MCV: 97.9 fL (ref 78.0–100.0)
PLATELETS: 203 10*3/uL (ref 150–400)
RBC: 2.88 MIL/uL — AB (ref 3.87–5.11)
RDW: 14.4 % (ref 11.5–15.5)
WBC: 14.2 10*3/uL — AB (ref 4.0–10.5)

## 2015-01-31 MED ORDER — DILTIAZEM HCL 60 MG PO TABS
60.0000 mg | ORAL_TABLET | Freq: Four times a day (QID) | ORAL | Status: AC
  Administered 2015-01-31 – 2015-02-05 (×17): 60 mg via ORAL
  Filled 2015-01-31 (×19): qty 1

## 2015-01-31 MED ORDER — TRAMADOL HCL 50 MG PO TABS
100.0000 mg | ORAL_TABLET | Freq: Two times a day (BID) | ORAL | Status: DC | PRN
Start: 2015-01-31 — End: 2015-01-31

## 2015-01-31 MED ORDER — TRAMADOL HCL 50 MG PO TABS: 50.0000 mg | ORAL_TABLET | Freq: Two times a day (BID) | ORAL | Status: DC | PRN

## 2015-01-31 MED ORDER — BENZONATATE 100 MG PO CAPS
200.0000 mg | ORAL_CAPSULE | Freq: Three times a day (TID) | ORAL | Status: DC
  Administered 2015-01-31 – 2015-02-09 (×28): 200 mg via ORAL
  Filled 2015-01-31 (×29): qty 2

## 2015-01-31 MED ORDER — BISOPROLOL FUMARATE 5 MG PO TABS
2.5000 mg | ORAL_TABLET | Freq: Two times a day (BID) | ORAL | Status: DC
  Administered 2015-01-31 – 2015-02-01 (×3): 2.5 mg via ORAL
  Filled 2015-01-31 (×5): qty 0.5

## 2015-01-31 NOTE — Progress Notes (Signed)
Physical Therapy Treatment Patient Details Name: Amanda Wilkins MRN: [id number] DOB: 07-Jun-1946 Today's Date: 01/31/2015    History of Present Illness Adm 01/22/15 with n/v, general malaise, diarrhea, fever, and Rt flank pain (which turned into Group A Strep infection); sepsis with hypotension, afib with RVR; 4/15 fluid overload with plans for HD cath/HD  PMHx-afib, OA, obesity    PT Comments    Pt pleasant and agreeable to increased activity with encouragement and reassurance. Gait limited by need for BM. Pt states she did not walk much in community at baseline and is agreeable to plans for ST-SNF at D/C at this point. Will continue to follow to maximize function and mobility. Pt encouraged to be OOB daily and continue HEP. HR 132 at rest end of session after toileting with assist for pericare.   Follow Up Recommendations  SNF;Supervision for mobility/OOB     Equipment Recommendations  Rolling walker with 5" wheels    Recommendations for Other Services       Precautions / Restrictions Precautions Precautions: Fall Precaution Comments: contact Restrictions Weight Bearing Restrictions: No    Mobility  Bed Mobility Overal bed mobility: Needs Assistance Bed Mobility: Rolling;Sidelying to Sit Rolling: Min assist Sidelying to sit: Min assist       General bed mobility comments: cues for sequence with assist to elevate trunk and sequence for back precautions as pt stating that is one of her concerns  Transfers Overall transfer level: Needs assistance   Transfers: Sit to/from Stand Sit to Stand: Min guard         General transfer comment: cues for hand placement and safety  Ambulation/Gait Ambulation/Gait assistance: Min assist Ambulation Distance (Feet): 10 Feet Assistive device: Rolling walker (2 wheeled) Gait Pattern/deviations: Step-through pattern;Decreased stride length;Wide base of support   Gait velocity interpretation: Below normal speed for  age/gender General Gait Details: limited by need to void and fatigue . HR 125-148 very inconsistent and sporadic with gait   Stairs            Wheelchair Mobility    Modified Rankin (Stroke Patients Only)       Balance                                    Cognition Arousal/Alertness: Awake/alert Behavior During Therapy: WFL for tasks assessed/performed Overall Cognitive Status: Within Functional Limits for tasks assessed                      Exercises General Exercises - Lower Extremity Ankle Circles/Pumps: AROM;Both;20 reps;Supine Heel Slides: AROM;Both;15 reps;Supine Hip ABduction/ADduction: AROM;Seated;Both;15 reps;Supine    General Comments        Pertinent Vitals/Pain Pain Assessment: 0-10 Pain Score: 3  Pain Location: right flank Pain Descriptors / Indicators: Aching;Sore Pain Intervention(s): Repositioned  sats 94% on 2L HR 115-155 sporadic, 132 at rest end of session    Home Living                      Prior Function            PT Goals (current goals can now be found in the care plan section) Progress towards PT goals: Progressing toward goals    Frequency  Min 3X/week    PT Plan Discharge plan needs to be updated    Co-evaluation             End of  Session Equipment Utilized During Treatment: Oxygen Activity Tolerance: Treatment limited secondary to medical complications (Comment) (cardiopulmonary tolerance) Patient left: in chair;with call bell/phone within [facility]     Time: 0852-0931 PT Time Calculation (min) (ACUTE ONLY): 39 min  Charges:  $Gait Training: 8-22 mins $Therapeutic Exercise: 8-22 mins                    G Codes:      Amanda Wilkins 02-20-2015, 9:36 AM Amanda Wilkins, [place]

## 2015-01-31 NOTE — Progress Notes (Signed)
Patient received in the bed resting. Received in report that patient refused MRI again this morning because she wants be left alone, and that patient was experiencing visual hallucinations. Patient reports that she must have fell asleep when she thought she was seeing cats and dogs coming in and out of her room this morning. Patient denies any visual hallucination at this time.  Patient educated on the importance of allowing the MRI to be completed and agrees to have it done tomorrow morning. Non productive cough continues. Patient denied any pain at this time. Will continue to monitor.

## 2015-01-31 NOTE — Progress Notes (Signed)
Subjective:  C/o cough; no chst pain  Objective:   Vital Signs : Filed Vitals:   01/30/15 2000 01/31/15 0000 01/31/15 0400 01/31/15 0500  BP: 100/73     Pulse: 65     Temp: 97.6 F (36.4 C) 98.3 F (36.8 C) 98.4 F (36.9 C)   TempSrc: Oral Oral Oral   Resp: 22     Height:      Weight:    250 lb 10.6 oz (113.7 kg)  SpO2: 95%       Intake/Output from previous day:  Intake/Output Summary (Last 24 hours) at 01/31/15 0829 Last data filed at 01/31/15 0500  Gross per 24 hour  Intake      3 ml  Output   1500 ml  Net  -1497 ml    I/O since admission: -372.5  Wt Readings from Last 3 Encounters:  01/31/15 250 lb 10.6 oz (113.7 kg)  01/04/15 229 lb (103.874 kg)  12/16/14 226 lb (102.513 kg)    Medications: . aspirin  325 mg Oral Daily  . cefTRIAXone (ROCEPHIN)  IV  2 g Intravenous Q24H  . chlorpheniramine-HYDROcodone  5 mL Oral Q12H  . estrogens (conjugated)  0.3 mg Oral Q48H  . famotidine  20 mg Oral QHS  . furosemide  160 mg Intravenous 3 times per day  . heparin  5,000 Units Subcutaneous 3 times per day  . HYDROcodone-homatropine  5 mL Oral Once  . levalbuterol  0.63 mg Nebulization BID  . LORazepam  1 mg Oral Once  . sertraline  50 mg Oral Daily  . sodium chloride  3 mL Intravenous Q12H  . traMADol  100 mg Oral Q12H    . diltiazem (CARDIZEM) infusion 10 mg/hr (01/31/15 0036)    Physical Exam:   General appearance: alert, cooperative and no distress Neck: no carotid bruit, no JVD, supple, symmetrical, trachea midline and thyroid not enlarged, symmetric, no tenderness/mass/nodules Lungs: fainst end exp wheeze, intermittent Heart: irregularly irregular rhythm Abdomen: soft, non-tender; bowel sounds normal; no masses,  no organomegaly Extremities: 1+ edema Pulses: 2+ and symmetric Neurologic: Grossly normal   Rate: 120 - 130  Rhythm: atrial fibrillation  ECG (independently read by me): last from 01/22/15: AF with RVR at 141; NSSTT changes; increased  Qtc  Lab Results:  BMP Latest Ref Rng 01/31/2015 01/30/2015 01/29/2015  Glucose 70 - 99 mg/dL 130(H) 113(H) 149(H)  BUN 6 - 23 mg/dL 33(H) 24(H) 48(H)  Creatinine 0.50 - 1.10 mg/dL 3.45(H) 2.68(H) 3.82(H)  Sodium 135 - 145 mmol/L 131(L) 133(L) 130(L)  Potassium 3.5 - 5.1 mmol/L 3.9 3.8 3.6  Chloride 96 - 112 mmol/L 93(L) 96 90(L)  CO2 19 - 32 mmol/L 26 26 24   Calcium 8.4 - 10.5 mg/dL 8.3(L) 7.8(L) 7.6(L)     CBC Latest Ref Rng 01/31/2015 01/30/2015 01/29/2015  WBC 4.0 - 10.5 K/uL 14.2(H) 10.6(H) 11.8(H)  Hemoglobin 12.0 - 15.0 g/dL 9.7(L) 9.5(L) 9.7(L)  Hematocrit 36.0 - 46.0 % 28.2(L) 26.9(L) 27.0(L)  Platelets 150 - 400 K/uL 203 162 140(L)     No results for input(s): TROPONINI in the last 72 hours.  Invalid input(s): CK, MB  Hepatic Function Panel  Recent Labs  01/29/15 0713  01/31/15 0555  PROT 5.7*  --   --   ALBUMIN 2.2*  < > 2.3*  AST 35  --   --   ALT 41*  --   --   ALKPHOS 109  --   --   BILITOT 0.6  --   --   < > =  values in this interval not displayed. No results for input(s): INR in the last 72 hours. BNP (last 3 results) No results for input(s): BNP in the last 8760 hours.  ProBNP (last 3 results)  Recent Labs  09/22/14 1755 11/11/14 0943  PROBNP 479.7* 173.0*     Lipid Panel     Component Value Date/Time   CHOL 178 11/11/2014 0943   TRIG 160.0* 11/11/2014 0943   HDL 68.70 11/11/2014 0943   CHOLHDL 3 11/11/2014 0943   VLDL 32.0 11/11/2014 0943   LDLCALC 77 11/11/2014 0943      Imaging:  Mr Abdomen Wo Contrast  01/29/2015   CLINICAL DATA:  Septic shock. Evaluate subcutaneous soft tissue swelling/ edema involving the abdominal wall.  EXAM: MRI ABDOMEN WITHOUT CONTRAST  TECHNIQUE: Multiplanar multisequence MR imaging was performed without the administration of intravenous contrast.  COMPARISON:  CT scan 01/24/2015  FINDINGS: There is diffuse edema like signal abnormality in the subcutaneous fat involving the entire abdominal wall more  significantly on the right side. I do not see a discrete drainable fluid collection to suggest an abscess. No definite involvement of the abdominal muscles. There is however involvement of the latissimus dorsi muscle on the right side.  Intra-abdominal findings include bowel dilatation and cholelithiasis. No large intra-abdominal fluid collections are identified.  IMPRESSION: Diffuse subcutaneous soft tissue swelling/ edema/fluid likely reflecting cellulitis. There is also myositis involving the right latissimus dorsi muscle. No focal drainable fluid collection/abscess is identified.  Bowel dilatation is noted. No significant intra abdominal fluid collection is identified.  Gallbladder distention and cholelithiasis.   Electronically Signed   By: Marijo Sanes M.D.   On: 01/29/2015 12:56    Echocardiogram 01/25/2015: Study Conclusions  - Left ventricle: The cavity size was normal. Wall thickness was normal. Systolic function was normal. The estimated ejection fraction was in the range of 55% to 60%. Wall motion was normal; there were no regional wall motion abnormalities. - Mitral valve: Calcified annulus. There was mild regurgitation. - Left atrium: The atrium was mildly dilated. - Pulmonary arteries: Systolic pressure was mildly increased. PA peak pressure: 32 mm Hg (S).  Impressions:  - Normal LV function; mild LAE; mild MR and TR; mildly elevated pulmonary pressure.   Assessment/Plan:   Principal Problem:   Atrial fibrillation with RVR Active Problems:   Palpitations   Upper respiratory disease   Dehydration   Breast pain, right   A-fib   Fever   Nausea & vomiting   Sepsis   Acute renal insufficiency   Abdominal pain   Axillary pain   Dyspnea   Hypoxia   Thrombocytopenia   Transaminitis   Hyperbilirubinemia   FUO (fever of unknown origin)   Septic shock   AKI (acute kidney injury)   Cholecystitis   DIC (disseminated intravascular coagulation)   Group A  streptococcal infection   Cellulitis   D-dimer, elevated   Severe sepsis with septic shock   Acute respiratory failure   HSV-1 (herpes simplex virus 1) infection   Gallstones   Shoulder pain  1. Persistent AF with RVR: currently on diltiazem drip at 10.  Will add cadioselective BB with bisoprolol as BP allows; f/u ECG today- none sine 01/22/15. 2. AKI: Cr in to 3.46 today following by renal felt mediated by AF, hypotension, bacteremia, Cox-2 inhib  Currently on lasix 160 mg iv tid 3. Septic shock Group A strep 4. Anemia 5. Mild hyponatremia 6. Possible inferior/inferolateral ischemia on prior myoview. No chest pain, negative cardiac  enzymes. Would defer further eval presently with renal and additional significant comorbidities.     Troy Sine, MD, [facility] 01/31/2015, 8:29 AM

## 2015-01-31 NOTE — Progress Notes (Signed)
Patient ID: Amanda Wilkins, female   DOB: [birth year], 69 y.o.   MRN: [id number] S:feels better O:BP 125/50 mmHg  Pulse 128  Temp(Src) 98.6 F (37 C) (Oral)  Resp 22  Ht 5\' 6"  (1.676 m)  Wt 113.7 kg (250 lb 10.6 oz)  BMI 40.48 kg/m2  SpO2 100%  Intake/Output Summary (Last 24 hours) at 01/31/15 1103 Last data filed at 01/31/15 1054  Gross per 24 hour  Intake    220 ml  Output   2200 ml  Net  -1980 ml   Intake/Output: I/O last 3 completed shifts: In: 113 [I.V.:113] Out: 1900 [Urine:1900]  Intake/Output this shift:  Total I/O In: 220 [P.O.:220] Out: 700 [Urine:700] Weight change: 1.7 kg (3 lb 12 oz) Gen:WD obese WF in NAd CVS: tachy @128 , no rub Resp:decreased BS at bases GMW:NUUVO, +BS Ext:2+ edema,  Vascular access- RIJ trialysis catheter   Recent Labs Lab 01/25/15 0500 01/26/15 0400 01/27/15 0420 01/28/15 0445 01/29/15 0713 01/30/15 0500 01/31/15 0555  NA 125* 126* 128* 126* 130* 133* 131*  K 3.8 3.0* 3.6 3.2* 3.6 3.8 3.9  CL 97 94* 94* 89* 90* 96 93*  CO2 13* 15* 19 21 24 26 26   GLUCOSE 92 111* 123* 143* 149* 113* 130*  BUN 66* 79* 83* 82* 48* 24* 33*  CREATININE 4.06* 4.66* 5.00* 5.21* 3.82* 2.68* 3.45*  ALBUMIN 2.0*  --   --   --  2.2* 2.2* 2.3*  CALCIUM 7.2* 7.4* 7.8* 7.9* 7.6* 7.8* 8.3*  PHOS  --  5.6* 5.3*  --   --  4.6 5.9*  AST 38*  --   --   --  35  --   --   ALT 37*  --   --   --  41*  --   --    Liver Function Tests:  Recent Labs Lab 01/25/15 0500 01/29/15 0713 01/30/15 0500 01/31/15 0555  AST 38* 35  --   --   ALT 37* 41*  --   --   ALKPHOS 41 109  --   --   BILITOT 1.2 0.6  --   --   PROT 5.0* 5.7*  --   --   ALBUMIN 2.0* 2.2* 2.2* 2.3*   No results for input(s): LIPASE, AMYLASE in the last 168 hours. No results for input(s): AMMONIA in the last 168 hours. CBC:  Recent Labs Lab 01/24/15 2043  01/27/15 0420 01/28/15 0445 01/29/15 0713 01/30/15 0030 01/31/15 0555  WBC 9.0  < > 14.0* 13.1* 11.8* 10.6* 14.2*  NEUTROABS  8.0*  --   --   --   --   --   --   HGB 11.6*  < > 9.9* 9.4* 9.7* 9.5* 9.7*  HCT 32.4*  < > 26.8* 26.0* 27.0* 26.9* 28.2*  MCV 94.5  < > 90.8 92.2 94.4 95.7 97.9  PLT 73*  74*  < > 81* 105* 140* 162 203  < > = values in this interval not displayed. Cardiac Enzymes: No results for input(s): CKTOTAL, CKMB, CKMBINDEX, TROPONINI in the last 168 hours. CBG:  Recent Labs Lab 01/24/15 2237 01/26/15 1607 01/28/15 0755 01/28/15 1139  GLUCAP 89 128* 132* 132*    Iron Studies: No results for input(s): IRON, TIBC, TRANSFERRIN, FERRITIN in the last 72 hours. Studies/Results: Mr Abdomen Wo Contrast  01/29/2015   CLINICAL DATA:  Septic shock. Evaluate subcutaneous soft tissue swelling/ edema involving the abdominal wall.  EXAM: MRI ABDOMEN WITHOUT CONTRAST  TECHNIQUE: Multiplanar multisequence MR  imaging was performed without the administration of intravenous contrast.  COMPARISON:  CT scan 01/24/2015  FINDINGS: There is diffuse edema like signal abnormality in the subcutaneous fat involving the entire abdominal wall more significantly on the right side. I do not see a discrete drainable fluid collection to suggest an abscess. No definite involvement of the abdominal muscles. There is however involvement of the latissimus dorsi muscle on the right side.  Intra-abdominal findings include bowel dilatation and cholelithiasis. No large intra-abdominal fluid collections are identified.  IMPRESSION: Diffuse subcutaneous soft tissue swelling/ edema/fluid likely reflecting cellulitis. There is also myositis involving the right latissimus dorsi muscle. No focal drainable fluid collection/abscess is identified.  Bowel dilatation is noted. No significant intra abdominal fluid collection is identified.  Gallbladder distention and cholelithiasis.   Electronically Signed   By: Rudie Meyer M.D.   On: 01/29/2015 12:56   . aspirin  325 mg Oral Daily  . bisoprolol  2.5 mg Oral BID  . cefTRIAXone (ROCEPHIN)  IV  2 g  Intravenous Q24H  . chlorpheniramine-HYDROcodone  5 mL Oral Q12H  . estrogens (conjugated)  0.3 mg Oral Q48H  . famotidine  20 mg Oral QHS  . furosemide  160 mg Intravenous 3 times per day  . heparin  5,000 Units Subcutaneous 3 times per day  . HYDROcodone-homatropine  5 mL Oral Once  . levalbuterol  0.63 mg Nebulization BID  . LORazepam  1 mg Oral Once  . sertraline  50 mg Oral Daily  . sodium chloride  3 mL Intravenous Q12H  . traMADol  100 mg Oral Q12H    BMET    Component Value Date/Time   NA 131* 01/31/2015 0555   K 3.9 01/31/2015 0555   CL 93* 01/31/2015 0555   CO2 26 01/31/2015 0555   GLUCOSE 130* 01/31/2015 0555   BUN 33* 01/31/2015 0555   CREATININE 3.45* 01/31/2015 0555   CREATININE 0.68 05/27/2014 0923   CALCIUM 8.3* 01/31/2015 0555   GFRNONAA 13* 01/31/2015 0555   GFRAA 15* 01/31/2015 0555   CBC    Component Value Date/Time   WBC 14.2* 01/31/2015 0555   RBC 2.88* 01/31/2015 0555   HGB 9.7* 01/31/2015 0555   HCT 28.2* 01/31/2015 0555   PLT 203 01/31/2015 0555   MCV 97.9 01/31/2015 0555   MCH 33.7 01/31/2015 0555   MCHC 34.4 01/31/2015 0555   RDW 14.4 01/31/2015 0555   LYMPHSABS 0.5* 01/24/2015 2043   MONOABS 0.4 01/24/2015 2043   EOSABS 0.1 01/24/2015 2043   BASOSABS 0.0 01/24/2015 2043     Assessment/Plan:  1. AKI/CKD in setting of sepsis.  First HD on 01/28/15 due to pulmonary edema and SOB.  She had a second session of HD on 01/29/15 but now with increase in UOP.  Hold off on further HD for now and follow closely.   2. Group A strep sepsis- ID following, on IV rocephin 3. A fib with RVR- on dilt drip, Cardiology following. 4. Hyponatremia- follow with diuresis 5. Metabolic acidosis- resolved. 6. SIRS- improving 7. Anemia- follow and transfuse prn 8. DM- per primary svc  Cody Oliger A

## 2015-01-31 NOTE — Progress Notes (Signed)
[place] TEAM 1 - Stepdown/ICU TEAM Progress Note  Lavisha Chesbrough UEA:[id number] DOB: Nov 25, 1945 DOA: 01/22/2015 PCP: Sonda Primes, MD  Admit HPI / Brief Narrative: 69 yo F w/ h/o pafib and htn who presented w/ 2 days of n/v, general malaise, diarrhea, and fever. When she started having palpitations she came to ED. She had vomited several times and was unable to hold down her home meds.  Significant Events: 4/09 Admit with N/V/D - lactic acid 3.6 4/10 CT Chest > inflammation in R axilla extending inferiorly along R chest, no focal evidence of abscess or gas, mild dependent atx 4/11 CT Abd/Pelvis > no renal obs, Gallbladder distention with possible internal gallstone identified 4/11 Renal US > no hydronephrosis, cholelithiasis, small L pleural effusion  4/11 PCCM consulted for sepsis. Lactic acid 1.4 4/12 Hypotensive on dilt gtt, tachy on dopamine gtt, some resp distress with fluids 4/12 LE Duplex > neg for DVT 4/12 RUE Duplex > neg for DVT 4/13 Off pressors 4/13 HIDA Scan > delayed filling of gallbladder, but the gallbladder fills after morphine administration, no evidence of biliary obstruction 4/14 Desaturated into mid 80's on RA while sleeping  HPI/Subjective: The patient refused her shoulder MRI for the second time.  She has been counseled on the repercussions of missing a diagnosis of septic arthritis.  She denies any shoulder pain whatsoever today.  She has no new complaints at this time and denies shortness of breath chest pain nausea or vomiting.  Assessment/Plan:  Septic shock due to Group A strep cellulitis / chest wall induration w/ bacteremia  ID following - MRI did not reveal a focal pocket of infection/abscess - abx per ID Team - pt had noted shoulder pain worrisome for septic arthritis - ID ordered MRI of shoulder but pt decided she does not want this done - continue antibiotics as per ID with plans to stop after the last dose on 4/24  ?Viral  Gastroenteritis - doubt Suspect her initial GI symptoms were likely due to her sepsis/severe cellulitis - symptoms resolved  Persistent dependent Atelectasis  Mobilize as able - incentive spirometer  Acute pulm edema medical management has been difficult due to acute kidney failure - presently the patient is stable in this regard  Persistent Atrial Fibrillation  Cardiology following - rate remains reasonably controlled - Cardiology team now suggests holding anticoagulation in the acute term - will attempt to transition from IV Cardizem today - cardiology is adding oral beta blocker  Acute Kidney Injury  hemodynamically mediated due to afib, hypotension, bacteremia, COX-2 inhib - Nephrology continues to follow - UOP picking up - no immediate plans for further hemodialysis at this time with close monitoring of labs to continue - not yet ready to remove hemodialysis catheter  HSV1 exacerbation Appears to be resolving   Hyponatremia due to kidney failure - follow   Thrombocytopenia Probable DIC - platelet count has now normalized  Hyperglycemia previous A1c's have been normal - recheck still pending  Abnormal Myoview  Obtained in March 2016 - Cards noted further eval will be required when more stable - patient is asymptomatic   Obesity - Body mass index is 40.48 kg/(m^2).  High D-dimer doubt PE, will not scan given renal function - venous duplex neg for DVT - will be on anticoagulation for atrial fibrillation nonetheless  Suspected OSA Saturations remaining stable - continue to wean oxygen as able - cont nocturnal CPAP   Code Status: FULL Family Communication: Spoke with husband at bedside Disposition Plan: SDU until  off Cardizem drip  Consultants: Cardiology  ID Nephrology   Antibiotics: Ceftriaxone 4/13 >  Clindamycin 4/12 > 4/16  DVT prophylaxis: Subcutaneous heparin  Objective: Blood pressure 143/85, pulse 128, temperature 98.3 F (36.8 C), temperature source  Oral, resp. rate 22, height 5\' 6"  (1.676 m), weight 113.7 kg (250 lb 10.6 oz), SpO2 100 %.  Intake/Output Summary (Last 24 hours) at 01/31/15 1503 Last data filed at 01/31/15 1232  Gross per 24 hour  Intake    220 ml  Output   2500 ml  Net  -2280 ml   Exam: General: No acute respiratory distress - alert and oriented Lungs: poor air movement throughout all fields with bibasilar crackles but no wheeze - Distant breath sounds  Cardiovascular: distant heart sounds, regular rate, no appreciable murmur  Abdomen: Nontender, obese, soft, bowel sounds positive, no rebound, no ascites, no appreciable mass Extremities: No significant cyanosis, clubbing; 1+ edema bilateral lower extremities   Data Reviewed: Basic Metabolic Panel:  Recent Labs Lab 01/26/15 0400 01/27/15 0420 01/28/15 0445 01/29/15 0713 01/30/15 0500 01/31/15 0555  NA 126* 128* 126* 130* 133* 131*  K 3.0* 3.6 3.2* 3.6 3.8 3.9  CL 94* 94* 89* 90* 96 93*  CO2 15* 19 21 24 26 26   GLUCOSE 111* 123* 143* 149* 113* 130*  BUN 79* 83* 82* 48* 24* 33*  CREATININE 4.66* 5.00* 5.21* 3.82* 2.68* 3.45*  CALCIUM 7.4* 7.8* 7.9* 7.6* 7.8* 8.3*  MG 2.4 2.6*  --   --   --   --   PHOS 5.6* 5.3*  --   --  4.6 5.9*    Liver Function Tests:  Recent Labs Lab 01/25/15 0500 01/29/15 0713 01/30/15 0500 01/31/15 0555  AST 38* 35  --   --   ALT 37* 41*  --   --   ALKPHOS 41 109  --   --   BILITOT 1.2 0.6  --   --   PROT 5.0* 5.7*  --   --   ALBUMIN 2.0* 2.2* 2.2* 2.3*    Coags:  Recent Labs Lab 01/24/15 2043  INR 1.18    Recent Labs Lab 01/24/15 2043  APTT 35    CBC:  Recent Labs Lab 01/24/15 2043  01/27/15 0420 01/28/15 0445 01/29/15 0713 01/30/15 0030 01/31/15 0555  WBC 9.0  < > 14.0* 13.1* 11.8* 10.6* 14.2*  NEUTROABS 8.0*  --   --   --   --   --   --   HGB 11.6*  < > 9.9* 9.4* 9.7* 9.5* 9.7*  HCT 32.4*  < > 26.8* 26.0* 27.0* 26.9* 28.2*  MCV 94.5  < > 90.8 92.2 94.4 95.7 97.9  PLT 73*  74*  < > 81* 105*  140* 162 203  < > = values in this interval not displayed.  CBG:  Recent Labs Lab 01/24/15 2237 01/26/15 1607 01/28/15 0755 01/28/15 1139  GLUCAP 89 128* 132* 132*    Recent Results (from the past 240 hour(s))  Culture, blood (routine x 2)     Status: None   Collection Time: 01/23/15 12:23 PM  Result Value Ref Range Status   Specimen Description BLOOD RIGHT HAND  Final   Special Requests BOTTLES DRAWN AEROBIC ONLY 4CC  Final   Culture   Final    GROUP A STREP (S.PYOGENES) ISOLATED Note: CRITICAL RESULT CALLED TO, READ BACK BY AND VERIFIED WITH: CHRISTINE HINSHAW @ 0935 ON [id number] BY [facility] FAXED TO [facility] TO [facility] 01/27/15  BY LYONK Note: Gram Stain Report Called to,Read Back By and Verified With: Murlean Hark RN on 01/24/15 at 06:55 by Christie Nottingham Performed at [facility]    Report Status 01/28/2015 FINAL  Final  Culture, blood (routine x 2)     Status: None   Collection Time: 01/23/15 12:30 PM  Result Value Ref Range Status   Specimen Description BLOOD LEFT HAND  Final   Special Requests BOTTLES DRAWN AEROBIC ONLY 5CC  Final   Culture   Final    NO GROWTH 5 DAYS Performed at [facility]    Report Status 01/29/2015 FINAL  Final  Clostridium Difficile by PCR     Status: None   Collection Time: 01/24/15 12:59 PM  Result Value Ref Range Status   C difficile by pcr NEGATIVE NEGATIVE Final  Culture, Urine     Status: None   Collection Time: 01/24/15  2:02 PM  Result Value Ref Range Status   Specimen Description URINE, CATHETERIZED  Final   Special Requests NONE  Final   Colony Count NO GROWTH Performed at [facility]   Final   Culture NO GROWTH Performed at [facility]   Final   Report Status 01/26/2015 FINAL  Final  MRSA PCR Screening     Status: None   Collection Time: 01/24/15  4:30 PM  Result Value Ref Range Status   MRSA by PCR NEGATIVE NEGATIVE Final    Comment:        The GeneXpert MRSA Assay (FDA approved for  NASAL specimens only), is one component of a comprehensive MRSA colonization surveillance program. It is not intended to diagnose MRSA infection nor to guide or monitor treatment for MRSA infections.     Studies:   Recent x-ray studies have been reviewed in detail by the Attending Physician  Scheduled Meds:  Scheduled Meds: . aspirin  325 mg Oral Daily  . bisoprolol  2.5 mg Oral BID  . cefTRIAXone (ROCEPHIN)  IV  2 g Intravenous Q24H  . chlorpheniramine-HYDROcodone  5 mL Oral Q12H  . estrogens (conjugated)  0.3 mg Oral Q48H  . famotidine  20 mg Oral QHS  . furosemide  160 mg Intravenous 3 times per day  . heparin  5,000 Units Subcutaneous 3 times per day  . HYDROcodone-homatropine  5 mL Oral Once  . levalbuterol  0.63 mg Nebulization BID  . LORazepam  1 mg Oral Once  . sertraline  50 mg Oral Daily  . sodium chloride  3 mL Intravenous Q12H  . traMADol  100 mg Oral Q12H    Time spent on care of this patient: 35 mins   Hilario Robarts T , MD   [facility] Office  [phone] Pager - Text Page per Loretha Stapler as per below:  On-Call/Text Page:      [email or web address]      password [email or web address]  If 7PM-7AM, please contact night-coverage [email or web address] Password [email or web address] 01/31/2015, 3:03 PM   LOS: 8 days

## 2015-01-31 NOTE — Evaluation (Signed)
Occupational Therapy Evaluation Patient Details Name: Amanda Wilkins MRN: [id number] DOB: 08-17-1946 Today's Date: 01/31/2015    History of Present Illness Adm 01/22/15 with n/v, general malaise, diarrhea, fever, and Rt flank pain (which turned into Group A Strep infection); sepsis with hypotension, afib with RVR; 4/15 fluid overload with plans for HD cath/HD  PMHx-afib, OA, obesity   Clinical Impression   Pt was independent and reports being active prior to admission.  Pt presents with decreased activity tolerance and impaired balance.  She is currently oxygen dependent.  Pt becomes fatigued with LB bathing and dressing and standing activities.  Will follow acutely.    Follow Up Recommendations  SNF    Equipment Recommendations       Recommendations for Other Services       Precautions / Restrictions Precautions Precautions: Fall Precaution Comments: contact Restrictions Weight Bearing Restrictions: No      Mobility Bed Mobility Overal bed mobility: Needs Assistance Bed Mobility: Rolling;Sidelying to Sit Rolling: Min assist Sidelying to sit: Min assist       General bed mobility comments: pt up in chair  Transfers Overall transfer level: Needs assistance Equipment used: 1 person hand held assist Transfers: Sit to/from Stand Sit to Stand: Min guard         General transfer comment: cues for hand placement and safety    Balance     Sitting balance-Leahy Scale: Fair       Standing balance-Leahy Scale: Fair                              ADL Overall ADL's : Needs assistance/impaired Eating/Feeding: Independent;Sitting   Grooming: Wash/dry hands;Min guard;Standing   Upper Body Bathing: Set up;Sitting   Lower Body Bathing: Minimal assistance;Sit to/from stand   Upper Body Dressing : Set up;Sitting   Lower Body Dressing: Minimal assistance;Sit to/from stand   Toilet Transfer: Min guard;Ambulation;BSC   Toileting- Marine scientist and Hygiene: Mod;Sit to/from stand       Functional mobility during ADLs: Minimal assistance (hand held) General ADL Comments: SOB with bending over to donn and doff socks, unable to bring her foot to her, pt is familiar with AE from when her mother used it, assisted for pericare due to fatigue     Vision     Perception     Praxis      Pertinent Vitals/Pain Pain Assessment: Faces Pain Score: 3  Faces Pain Scale: Hurts a little bit Pain Location: under R arm Pain Descriptors / Indicators: Sore Pain Intervention(s): Monitored during session     Hand Dominance Right   Extremity/Trunk Assessment Upper Extremity Assessment Upper Extremity Assessment: Overall WFL for tasks assessed   Lower Extremity Assessment Lower Extremity Assessment: Defer to PT evaluation   Cervical / Trunk Assessment Cervical / Trunk Assessment: Normal   Communication Communication Communication: No difficulties   Cognition Arousal/Alertness: Awake/alert Behavior During Therapy: WFL for tasks assessed/performed Overall Cognitive Status: Within Functional Limits for tasks assessed                     General Comments       Exercises       Shoulder Instructions      Home Living Family/patient expects to be discharged to:: Private residence Living Arrangements: Spouse/significant other Available Help at Discharge: Family Type of Home: House Home Access: Stairs to enter Technical brewer of Steps: 4 Entrance Stairs-Rails: Right Home Layout: One  level     Bathroom Shower/Tub: Occupational psychologist: Handicapped height     Home Equipment: None          Prior Functioning/Environment Level of Independence: Independent        Comments: pt reports an active lifestyle, retired    OT Diagnosis: Generalized weakness;Acute pain   OT Problem List: Impaired balance (sitting and/or standing);Decreased activity tolerance;Decreased knowledge of use of  DME or AE;Cardiopulmonary status limiting activity;Obesity;Pain   OT Treatment/Interventions: Self-care/ADL training;Energy conservation;DME and/or AE instruction;Therapeutic activities;Patient/family education    OT Goals(Current goals can be found in the care plan section) Acute Rehab OT Goals Patient Stated Goal: regain strength and independence OT Goal Formulation: With patient Time For Goal Achievement: 02/14/15 Potential to Achieve Goals: Good ADL Goals Pt Will Perform Grooming: with modified independence;standing (3 activities) Pt Will Perform Lower Body Bathing: with modified independence;with adaptive equipment;sit to/from stand Pt Will Perform Lower Body Dressing: with modified independence;with adaptive equipment;sit to/from stand Pt Will Transfer to Toilet: with modified independence;ambulating Pt Will Perform Toileting - Clothing Manipulation and hygiene: with modified independence;sit to/from stand Additional ADL Goal #1: Pt will state at least 3 energy conservation strategies instructed.  OT Frequency: Min 2X/week   Barriers to D/C:            Co-evaluation              End of Session Equipment Utilized During Treatment: Gait belt;Oxygen  Activity Tolerance: Patient limited by fatigue Patient left: in chair;with call bell/phone within reach (MD in room)   Time: 0092-3300 OT Time Calculation (min): 17 min Charges:  OT General Charges $OT Visit: 1 Procedure OT Evaluation $Initial OT Evaluation Tier I: 1 Procedure G-Codes:    Amanda Wilkins 01/31/2015, 11:33 AM  [phone]

## 2015-01-31 NOTE — Progress Notes (Signed)
Pt refused cpap tonight, stating she does not wear one at home.  Pt remains on 2lnc.  Pt was advised that RT is available all night should she change her mind.  Cpap remains at bedside.  RT will continue to monitor and assess as needed.

## 2015-01-31 NOTE — Progress Notes (Signed)
[facility]  Date of Admission:  01/22/2015  Antibiotics: ceftriaxone  Subjective: No new complaints, up walking today, some mild pain lower right axillary region, no shoulder pain  Objective: Temp:  [97.6 F (36.4 C)-98.6 F (37 C)] 98.6 F (37 C) (04/18 0906) Pulse Rate:  [65-128] 128 (04/18 0918) Resp:  [14-22] 22 (04/17 2000) BP: (100-125)/(40-73) 125/50 mmHg (04/18 0906) SpO2:  [95 %-100 %] 100 % (04/18 1002) FiO2 (%):  [28 %] 28 % (04/18 1002) Weight:  [250 lb 10.6 oz (113.7 kg)] 250 lb 10.6 oz (113.7 kg) (04/18 0500)  General: awake, in chair, nad Skin: improving rash, facial lesions improving Lungs: CTA B Cor: RRR Abdomen: soft, nt, nd   Lab Results Lab Results  Component Value Date   WBC 14.2* 01/31/2015   HGB 9.7* 01/31/2015   HCT 28.2* 01/31/2015   MCV 97.9 01/31/2015   PLT 203 01/31/2015    Lab Results  Component Value Date   CREATININE 3.45* 01/31/2015   BUN 33* 01/31/2015   NA 131* 01/31/2015   K 3.9 01/31/2015   CL 93* 01/31/2015   CO2 26 01/31/2015    Lab Results  Component Value Date   ALT 41* 01/29/2015   AST 35 01/29/2015   ALKPHOS 109 01/29/2015   BILITOT 0.6 01/29/2015      Microbiology: Recent Results (from the past 240 hour(s))  Culture, blood (routine x 2)     Status: None   Collection Time: 01/23/15 12:23 PM  Result Value Ref Range Status   Specimen Description BLOOD RIGHT HAND  Final   Special Requests BOTTLES DRAWN AEROBIC ONLY 4CC  Final   Culture   Final    GROUP A STREP (S.PYOGENES) ISOLATED Note: CRITICAL RESULT CALLED TO, READ BACK BY AND VERIFIED WITH: CHRISTINE HINSHAW @ 5638 ON [id number] BY [place] FAXED TO [facility] TO [place] 01/27/15 BY LYONK Note: Gram Stain Report Called to,Read Back By and Verified With: Sherri Rad RN on 01/24/15 at 06:55 by Rise Mu Performed at [facility]    Report Status 01/28/2015 FINAL  Final  Culture, blood (routine x 2)     Status: None   Collection Time: 01/23/15 12:30 PM  Result Value Ref Range Status   Specimen Description BLOOD LEFT HAND  Final   Special Requests BOTTLES DRAWN AEROBIC ONLY 5CC  Final   Culture   Final    NO GROWTH 5 DAYS Performed at [facility]    Report Status 01/29/2015 FINAL  Final  Clostridium Difficile by PCR     Status: None   Collection Time: 01/24/15 12:59 PM  Result Value Ref Range Status   C difficile by pcr NEGATIVE NEGATIVE Final  Culture, Urine     Status: None   Collection Time: 01/24/15  2:02 PM  Result Value Ref Range Status   Specimen Description URINE, CATHETERIZED  Final   Special Requests NONE  Final   Colony Count NO GROWTH Performed at [facility]   Final   Culture NO GROWTH Performed at [facility]   Final   Report Status 01/26/2015 FINAL  Final  MRSA PCR Screening     Status: None   Collection Time: 01/24/15  4:30 PM  Result Value Ref Range Status   MRSA by PCR NEGATIVE NEGATIVE Final    Comment:        The GeneXpert MRSA Assay (FDA approved for NASAL specimens only), is one component of a  comprehensive MRSA colonization surveillance program. It is not intended to diagnose MRSA infection nor to guide or monitor treatment for MRSA infections.     Studies/Results: Mr Abdomen Wo Contrast  01/29/2015   CLINICAL DATA:  Septic shock. Evaluate subcutaneous soft tissue swelling/ edema involving the abdominal wall.  EXAM: MRI ABDOMEN WITHOUT CONTRAST  TECHNIQUE: Multiplanar multisequence MR imaging was performed without the administration of intravenous contrast.  COMPARISON:  CT scan 01/24/2015  FINDINGS: There is diffuse edema like signal abnormality in the subcutaneous fat involving the entire abdominal wall more significantly on the right side. I do not see a discrete drainable fluid collection to suggest an abscess. No definite involvement of the abdominal muscles. There is however involvement of the latissimus dorsi muscle on the right  side.  Intra-abdominal findings include bowel dilatation and cholelithiasis. No large intra-abdominal fluid collections are identified.  IMPRESSION: Diffuse subcutaneous soft tissue swelling/ edema/fluid likely reflecting cellulitis. There is also myositis involving the right latissimus dorsi muscle. No focal drainable fluid collection/abscess is identified.  Bowel dilatation is noted. No significant intra abdominal fluid collection is identified.  Gallbladder distention and cholelithiasis.   Electronically Signed   By: Marijo Sanes M.D.   On: 01/29/2015 12:56    Assessment/Plan:  1) GAS shock, erysipelas - on ceftriaxone and to continue through 4/24.  Had initially complained of some shoulder pain but now resolved.  No indication for MRI.  Can complete the course and stop on 4/24 unless concerns then.    I will sign off, please call with questions. thanks  Scharlene Gloss, [facility] [place] [email or web address] [phone] pager   [phone] cell 01/31/2015, 10:24 AM

## 2015-02-01 DIAGNOSIS — E876 Hypokalemia: Secondary | ICD-10-CM | POA: Diagnosis present

## 2015-02-01 DIAGNOSIS — Z9989 Dependence on other enabling machines and devices: Secondary | ICD-10-CM

## 2015-02-01 DIAGNOSIS — J9811 Atelectasis: Secondary | ICD-10-CM | POA: Diagnosis present

## 2015-02-01 DIAGNOSIS — I4819 Other persistent atrial fibrillation: Secondary | ICD-10-CM | POA: Diagnosis present

## 2015-02-01 DIAGNOSIS — I481 Persistent atrial fibrillation: Secondary | ICD-10-CM

## 2015-02-01 DIAGNOSIS — G4733 Obstructive sleep apnea (adult) (pediatric): Secondary | ICD-10-CM | POA: Diagnosis present

## 2015-02-01 DIAGNOSIS — R739 Hyperglycemia, unspecified: Secondary | ICD-10-CM | POA: Diagnosis present

## 2015-02-01 DIAGNOSIS — J810 Acute pulmonary edema: Secondary | ICD-10-CM | POA: Diagnosis present

## 2015-02-01 DIAGNOSIS — A409 Streptococcal sepsis, unspecified: Secondary | ICD-10-CM

## 2015-02-01 DIAGNOSIS — E871 Hypo-osmolality and hyponatremia: Secondary | ICD-10-CM | POA: Diagnosis present

## 2015-02-01 DIAGNOSIS — N179 Acute kidney failure, unspecified: Secondary | ICD-10-CM | POA: Diagnosis present

## 2015-02-01 DIAGNOSIS — R791 Abnormal coagulation profile: Secondary | ICD-10-CM

## 2015-02-01 DIAGNOSIS — R7989 Other specified abnormal findings of blood chemistry: Secondary | ICD-10-CM | POA: Diagnosis present

## 2015-02-01 DIAGNOSIS — R6521 Severe sepsis with septic shock: Secondary | ICD-10-CM

## 2015-02-01 LAB — RENAL FUNCTION PANEL
ALBUMIN: 2.2 g/dL — AB (ref 3.5–5.2)
ANION GAP: 13 (ref 5–15)
BUN: 36 mg/dL — AB (ref 6–23)
CHLORIDE: 90 mmol/L — AB (ref 96–112)
CO2: 26 mmol/L (ref 19–32)
Calcium: 8.3 mg/dL — ABNORMAL LOW (ref 8.4–10.5)
Creatinine, Ser: 3.81 mg/dL — ABNORMAL HIGH (ref 0.50–1.10)
GFR calc Af Amer: 13 mL/min — ABNORMAL LOW (ref >90)
GFR calc non Af Amer: 11 mL/min — ABNORMAL LOW (ref >90)
Glucose, Bld: 105 mg/dL — ABNORMAL HIGH (ref 70–99)
POTASSIUM: 3.1 mmol/L — AB (ref 3.5–5.1)
Phosphorus: 6.7 mg/dL — ABNORMAL HIGH (ref 2.3–4.6)
Sodium: 129 mmol/L — ABNORMAL LOW (ref 135–145)

## 2015-02-01 LAB — CBC
HCT: 27.1 % — ABNORMAL LOW (ref 36.0–46.0)
Hemoglobin: 9.4 g/dL — ABNORMAL LOW (ref 12.0–15.0)
MCH: 33.9 pg (ref 26.0–34.0)
MCHC: 34.7 g/dL (ref 30.0–36.0)
MCV: 97.8 fL (ref 78.0–100.0)
PLATELETS: 207 10*3/uL (ref 150–400)
RBC: 2.77 MIL/uL — AB (ref 3.87–5.11)
RDW: 14.1 % (ref 11.5–15.5)
WBC: 14.4 10*3/uL — ABNORMAL HIGH (ref 4.0–10.5)

## 2015-02-01 LAB — HEPATITIS C ANTIBODY (REFLEX): HCV Ab: NEGATIVE

## 2015-02-01 MED ORDER — VALACYCLOVIR HCL 500 MG PO TABS
1000.0000 mg | ORAL_TABLET | Freq: Every day | ORAL | Status: DC
  Administered 2015-02-01 – 2015-02-09 (×9): 1000 mg via ORAL
  Filled 2015-02-01 (×9): qty 2

## 2015-02-01 MED ORDER — POTASSIUM CHLORIDE 20 MEQ/15ML (10%) PO SOLN
20.0000 meq | Freq: Every day | ORAL | Status: DC
  Administered 2015-02-01: 20 meq via ORAL
  Filled 2015-02-01 (×3): qty 15

## 2015-02-01 MED ORDER — FUROSEMIDE 10 MG/ML IJ SOLN
80.0000 mg | Freq: Two times a day (BID) | INTRAMUSCULAR | Status: DC
  Administered 2015-02-01 – 2015-02-02 (×2): 80 mg via INTRAVENOUS
  Filled 2015-02-01 (×3): qty 8

## 2015-02-01 MED ORDER — BISOPROLOL FUMARATE 5 MG PO TABS
5.0000 mg | ORAL_TABLET | Freq: Two times a day (BID) | ORAL | Status: DC
  Administered 2015-02-01 – 2015-02-04 (×6): 5 mg via ORAL
  Filled 2015-02-01 (×8): qty 1

## 2015-02-01 NOTE — Progress Notes (Signed)
[place] TEAM 1 - Stepdown/ICU TEAM Progress Note  Amanda Wilkins PJK:[id number] DOB: [birth year] DOA: 01/22/2015 PCP: [name], MD  Admit HPI / Brief Narrative: 69 yo WF PMHx depression, HTN, paroxysmal atrial fibrillation, chronic interstitial nephritis. Presented w/ 2 days of n/v, general malaise, diarrhea, and fever. When she started having palpitations she came to ED. She had vomited several times and was unable to hold down her home meds  HPI/Subjective: 4/19 A/O 4, complains of multiple ulcers along upper and lower lip as well as within the mucosa inside her mouth. States were not present prior to admission.  Assessment/Plan: Septic shock due to Group A strep cellulitis / chest wall induration w/ bacteremia  -ID following - MRI did not reveal a focal pocket of infection/abscess - abx per ID Team  - pt had noted shoulder pain worrisome for septic arthritis;  ID ordered MRI of shoulder but pt decided she does not want this done  - continue antibiotics as per ID with plans to stop after the last dose on 4/24  ?Viral Gastroenteritis - doubt Suspect her initial GI symptoms were likely due to her sepsis/severe cellulitis - symptoms resolved  Persistent dependent Atelectasis  Mobilize as able - incentive spirometer  Acute pulm edema medical management has been difficult due to acute kidney failure - presently the patient is stable in this regard  Persistent Atrial Fibrillation  -Cardiology following ,- rate remains reasonably controlled -Cardizem 60 mg QID -Lasix 80 mg BID -Bisoprolol 5 mg BID  Acute Kidney Injury  -hemodynamically mediated due to afib, hypotension, bacteremia, COX-2 inhib -Strict in and out - Nephrology agrees to watch for waiting. Patient understands she still may require HD   HSV1 exacerbation -Valacyclovir 1000 mg daily    Hyponatremia -due to kidney failure: Stable   Hypokalemia -Potassium 20 mEq  daily  Thrombocytopenia -Probable DIC - platelet count has now normalized  Hyperglycemia -previous A1c's have been normal - recheck still pending  Abnormal Myoview  -Obtained in March 2016 - Cards noted further eval will be required when more stable - patient is asymptomatic   Obesity - Body mass index is 40.48 kg/(m^2).  High D-dimer -doubt PE, will not scan given renal function, most likely secondary to renal failure - venous duplex neg for DVT, will be on anticoagulation for atrial fibrillation nonetheless  Suspected OSA -Saturations remaining stable - continue to wean oxygen as able - cont nocturnal CPAP    Code Status: FULL Family Communication: no family present at time of exam Disposition Plan: CIR vs SNF    Consultants: Cardiology  ID Nephrology   Procedure/Significant Events: 4/09 Admit with N/V/D - lactic acid 3.6 4/10 CT Chest > inflammation in R axilla extending inferiorly along R chest, no focal evidence of abscess or gas, mild dependent atx 4/11 CT Abd/Pelvis > no renal obs, Gallbladder distention with possible internal gallstone identified 4/11 Renal US > no hydronephrosis, cholelithiasis, small L pleural effusion  4/11 PCCM consulted for sepsis. Lactic acid 1.4 4/12 Hypotensive on dilt gtt, tachy on dopamine gtt, some resp distress with fluids 4/12 LE Duplex > neg for DVT 4/12 RUE Duplex > neg for DVT 4/13 Off pressors 4/13 HIDA Scan > delayed filling of gallbladder, but the gallbladder fills after morphine administration, no evidence of biliary obstruction 4/14 Desaturated into mid 80's on RA while sleeping   Culture   Antibiotics: Clindamycin 4/12 > 4/16 Ceftriaxone 4/13 > > Valacyclovir 4/19>>    DVT prophylaxis: Subcutaneous heparin   Devices  LINES / TUBES:  Right IJ Vas-Cath    Continuous Infusions:   Objective: VITAL SIGNS: Temp: 98.4 F (36.9 C) (04/19 2016) Temp Source: Oral (04/19 2016) BP: 125/75  mmHg (04/19 2016) Pulse Rate: 90 (04/19 2016) SPO2; FIO2:   Intake/Output Summary (Last 24 hours) at 02/01/15 2040 Last data filed at 02/01/15 2000  Gross per 24 hour  Intake    290 ml  Output   4825 ml  Net  -4535 ml     Exam: General: A/O 4, NAD, No acute respiratory distress Lungs: Clear to auscultation bilaterally without wheezes or crackles Cardiovascular: Regular rate and rhythm without murmur gallop or rub normal S1 and S2 Abdomen: Nontender, nondistended, soft, bowel sounds positive, no rebound, no ascites, no appreciable mass Extremities: No significant cyanosis, clubbing, or edema bilateral lower extremities Skin; multiple Aphthous ulcers inside and outside mouth  Data Reviewed: Basic Metabolic Panel:  Recent Labs Lab 01/26/15 0400 01/27/15 0420 01/28/15 0445 01/29/15 0713 01/30/15 0500 01/31/15 0555 02/01/15 0422  NA 126* 128* 126* 130* 133* 131* 129*  K 3.0* 3.6 3.2* 3.6 3.8 3.9 3.1*  CL 94* 94* 89* 90* 96 93* 90*  CO2 15* 19 21 24 26 26 26   GLUCOSE 111* 123* 143* 149* 113* 130* 105*  BUN 79* 83* 82* 48* 24* 33* 36*  CREATININE 4.66* 5.00* 5.21* 3.82* 2.68* 3.45* 3.81*  CALCIUM 7.4* 7.8* 7.9* 7.6* 7.8* 8.3* 8.3*  MG 2.4 2.6*  --   --   --   --   --   PHOS 5.6* 5.3*  --   --  4.6 5.9* 6.7*   Liver Function Tests:  Recent Labs Lab 01/29/15 0713 01/30/15 0500 01/31/15 0555 02/01/15 0422  AST 35  --   --   --   ALT 41*  --   --   --   ALKPHOS 109  --   --   --   BILITOT 0.6  --   --   --   PROT 5.7*  --   --   --   ALBUMIN 2.2* 2.2* 2.3* 2.2*   No results for input(s): LIPASE, AMYLASE in the last 168 hours. No results for input(s): AMMONIA in the last 168 hours. CBC:  Recent Labs Lab 01/28/15 0445 01/29/15 0713 01/30/15 0030 01/31/15 0555 02/01/15 0422  WBC 13.1* 11.8* 10.6* 14.2* 14.4*  HGB 9.4* 9.7* 9.5* 9.7* 9.4*  HCT 26.0* 27.0* 26.9* 28.2* 27.1*  MCV 92.2 94.4 95.7 97.9 97.8  PLT 105* 140* 162 203 207   Cardiac Enzymes: No  results for input(s): CKTOTAL, CKMB, CKMBINDEX, TROPONINI in the last 168 hours. BNP (last 3 results) No results for input(s): BNP in the last 8760 hours.  ProBNP (last 3 results)  Recent Labs  09/22/14 1755 11/11/14 0943  PROBNP 479.7* 173.0*    CBG:  Recent Labs Lab 01/26/15 1607 01/28/15 0755 01/28/15 1139  GLUCAP 128* 132* 132*    Recent Results (from the past 240 hour(s))  Culture, blood (routine x 2)     Status: None   Collection Time: 01/23/15 12:23 PM  Result Value Ref Range Status   Specimen Description BLOOD RIGHT HAND  Final   Special Requests BOTTLES DRAWN AEROBIC ONLY 4CC  Final   Culture   Final    GROUP A STREP (S.PYOGENES) ISOLATED Note: CRITICAL RESULT CALLED TO, READ BACK BY AND VERIFIED WITH: CHRISTINE HINSHAW @ [phone] ON [id number] BY Lavina FAXED TO [place] TO [place] 01/27/15 BY LYONK Note:  Gram Stain Report Called to,Read Back By and Verified With: Sherri Rad RN on 01/24/15 at 06:55 by Rise Mu Performed at [facility]    Report Status 01/28/2015 FINAL  Final  Culture, blood (routine x 2)     Status: None   Collection Time: 01/23/15 12:30 PM  Result Value Ref Range Status   Specimen Description BLOOD LEFT HAND  Final   Special Requests BOTTLES DRAWN AEROBIC ONLY 5CC  Final   Culture   Final    NO GROWTH 5 DAYS Performed at [facility]    Report Status 01/29/2015 FINAL  Final  Clostridium Difficile by PCR     Status: None   Collection Time: 01/24/15 12:59 PM  Result Value Ref Range Status   C difficile by pcr NEGATIVE NEGATIVE Final  Culture, Urine     Status: None   Collection Time: 01/24/15  2:02 PM  Result Value Ref Range Status   Specimen Description URINE, CATHETERIZED  Final   Special Requests NONE  Final   Colony Count NO GROWTH Performed at [facility]   Final   Culture NO GROWTH Performed at [facility]   Final   Report Status 01/26/2015 FINAL  Final  MRSA PCR Screening     Status:  None   Collection Time: 01/24/15  4:30 PM  Result Value Ref Range Status   MRSA by PCR NEGATIVE NEGATIVE Final    Comment:        The GeneXpert MRSA Assay (FDA approved for NASAL specimens only), is one component of a comprehensive MRSA colonization surveillance program. It is not intended to diagnose MRSA infection nor to guide or monitor treatment for MRSA infections.      Studies:  Recent x-ray studies have been reviewed in detail by the Attending Physician  Scheduled Meds:  Scheduled Meds: . aspirin  325 mg Oral Daily  . benzonatate  200 mg Oral TID  . bisoprolol  5 mg Oral BID  . cefTRIAXone (ROCEPHIN)  IV  2 g Intravenous Q24H  . chlorpheniramine-HYDROcodone  5 mL Oral Q12H  . diltiazem  60 mg Oral 4 times per day  . estrogens (conjugated)  0.3 mg Oral Q48H  . famotidine  20 mg Oral QHS  . furosemide  80 mg Intravenous BID  . heparin  5,000 Units Subcutaneous 3 times per day  . levalbuterol  0.63 mg Nebulization BID  . potassium chloride  20 mEq Oral Daily  . sertraline  50 mg Oral Daily  . valACYclovir  1,000 mg Oral Daily    Time spent on care of this patient: 40 mins   WOODS, Geraldo Docker , MD  [facility] Office  [phone] Pager [phone]  On-Call/Text Page:      [email or web address]      password [email or web address]  If 7PM-7AM, please contact night-coverage [email or web address] Password [email or web address] 02/01/2015, 8:40 PM   LOS: 9 days   Care during the described time interval was provided by me .  I have reviewed this patient's available data, including medical history, events of note, physical examination, radiology studies and test results as part of my evaluation  Dia Crawford, MD [phone] Pager

## 2015-02-01 NOTE — Clinical Social Work Note (Signed)
Pt agreeable to SNF placement- bed search initiated.  Full assessment to follow.  Domenica Reamer, [place] Social Worker [phone]

## 2015-02-01 NOTE — Progress Notes (Signed)
Pt has refused CPAP for the night, RT to monitor and assess as needed.  

## 2015-02-01 NOTE — Progress Notes (Signed)
Subjective:  C/o cough; no chst pain  Objective:   Vital Signs : Filed Vitals:   01/31/15 2344 02/01/15 0352 02/01/15 0400 02/01/15 0800  BP: 138/68   148/73  Pulse: 74   108  Temp: 98.2 F (36.8 C)  98.4 F (36.9 C)   TempSrc: Axillary  Oral   Resp: 19   30  Height:      Weight:  251 lb 5.2 oz (114 kg)    SpO2: 100%   100%    Intake/Output from previous day:  Intake/Output Summary (Last 24 hours) at 02/01/15 0838 Last data filed at 01/31/15 2300  Gross per 24 hour  Intake 1249.67 ml  Output   2450 ml  Net -1200.33 ml    I/O since admission: -1572.9  Wt Readings from Last 3 Encounters:  02/01/15 251 lb 5.2 oz (114 kg)  01/04/15 229 lb (103.874 kg)  12/16/14 226 lb (102.513 kg)    Medications: . aspirin  325 mg Oral Daily  . benzonatate  200 mg Oral TID  . bisoprolol  2.5 mg Oral BID  . cefTRIAXone (ROCEPHIN)  IV  2 g Intravenous Q24H  . chlorpheniramine-HYDROcodone  5 mL Oral Q12H  . diltiazem  60 mg Oral 4 times per day  . estrogens (conjugated)  0.3 mg Oral Q48H  . famotidine  20 mg Oral QHS  . furosemide  160 mg Intravenous 3 times per day  . heparin  5,000 Units Subcutaneous 3 times per day  . levalbuterol  0.63 mg Nebulization BID  . sertraline  50 mg Oral Daily       Physical Exam:   General appearance: alert, cooperative and no distress Skin: erythematous rash now appears less intense but blisters present Neck: no carotid bruit, no JVD, supple, symmetrical, trachea midline and thyroid not enlarged, symmetric, no tenderness/mass/nodules Lungs: fainst end exp wheeze, intermittent Heart: irregularly irregular rhythm, rate 115 better, 1/6 sem Abdomen: obese; soft, non-tender; bowel sounds normal; no masses,  no organomegaly Extremities: 1+ edema Pulses: 2+ and symmetric Neurologic: Grossly normal   Rate: 110 - 115  Rhythm: atrial fibrillation  ECG (independently read by me): last from 01/22/15: AF with RVR at 141; NSSTT changes; increased  QTc 01/31/2015:  AF at 127; inc QTc at 473  Lab Results:  BMP Latest Ref Rng 02/01/2015 01/31/2015 01/30/2015  Glucose 70 - 99 mg/dL 105(H) 130(H) 113(H)  BUN 6 - 23 mg/dL 36(H) 33(H) 24(H)  Creatinine 0.50 - 1.10 mg/dL 3.81(H) 3.45(H) 2.68(H)  Sodium 135 - 145 mmol/L 129(L) 131(L) 133(L)  Potassium 3.5 - 5.1 mmol/L 3.1(L) 3.9 3.8  Chloride 96 - 112 mmol/L 90(L) 93(L) 96  CO2 19 - 32 mmol/L 26 26 26   Calcium 8.4 - 10.5 mg/dL 8.3(L) 8.3(L) 7.8(L)     CBC Latest Ref Rng 02/01/2015 01/31/2015 01/30/2015  WBC 4.0 - 10.5 K/uL 14.4(H) 14.2(H) 10.6(H)  Hemoglobin 12.0 - 15.0 g/dL 9.4(L) 9.7(L) 9.5(L)  Hematocrit 36.0 - 46.0 % 27.1(L) 28.2(L) 26.9(L)  Platelets 150 - 400 K/uL 207 203 162     No results for input(s): TROPONINI in the last 72 hours.  Invalid input(s): CK, MB  Hepatic Function Panel  Recent Labs  02/01/15 0422  ALBUMIN 2.2*   No results for input(s): INR in the last 72 hours. BNP (last 3 results) No results for input(s): BNP in the last 8760 hours.  ProBNP (last 3 results)  Recent Labs  09/22/14 1755 11/11/14 0943  PROBNP 479.7* 173.0*  Lipid Panel     Component Value Date/Time   CHOL 178 11/11/2014 0943   TRIG 160.0* 11/11/2014 0943   HDL 68.70 11/11/2014 0943   CHOLHDL 3 11/11/2014 0943   VLDL 32.0 11/11/2014 0943   LDLCALC 77 11/11/2014 0943      Imaging:  No results found.  Echocardiogram 01/25/2015: Study Conclusions  - Left ventricle: The cavity size was normal. Wall thickness was normal. Systolic function was normal. The estimated ejection fraction was in the range of 55% to 60%. Wall motion was normal; there were no regional wall motion abnormalities. - Mitral valve: Calcified annulus. There was mild regurgitation. - Left atrium: The atrium was mildly dilated. - Pulmonary arteries: Systolic pressure was mildly increased. PA peak pressure: 32 mm Hg (S).  Impressions:  - Normal LV function; mild LAE; mild MR and TR;  mildly elevated pulmonary pressure.   Assessment/Plan:   Principal Problem:   Atrial fibrillation with RVR Active Problems:   Palpitations   Upper respiratory disease   Dehydration   Breast pain, right   A-fib   Fever   Nausea & vomiting   Sepsis   Acute renal insufficiency   Abdominal pain   Axillary pain   Dyspnea   Hypoxia   Thrombocytopenia   Transaminitis   Hyperbilirubinemia   FUO (fever of unknown origin)   Septic shock   AKI (acute kidney injury)   Cholecystitis   DIC (disseminated intravascular coagulation)   Group A streptococcal infection   Cellulitis   D-dimer, elevated   Severe sepsis with septic shock   Acute respiratory failure   HSV-1 (herpes simplex virus 1) infection   Gallstones   Shoulder pain  1. Persistent AF with RVR: Transitioned to oral diltiazem now on 60 mg qid.  Bisoprolol started at low dose yesterday. Will titrate to 5 mg bid today for improved rated control. Will need to initiate anticoagulation when risk of bleeding is reduced from comorbidities. 2. AKI: Cr increased further to  to 3.81 today following by renal felt mediated by AF, hypotension, bacteremia, Cox-2 inhib  Currently on lasix 160 mg iv tid; now with inc UOP; last HD on 4/16 3. Septic shock Group A strep 4. Anemia 5. Hyponatremia: today 3.1; replete to 4.0 6. Possible inferior/inferolateral ischemia on prior myoview. No chest pain, negative cardiac enzymes. Would defer further eval presently with renal and additional significant comorbidities. 7. Erythematous rash, less intense but now with blistering 8. DM    Troy Sine, MD, [facility] 02/01/2015, 8:38 AM

## 2015-02-01 NOTE — Clinical Social Work Psychosocial (Signed)
Clinical Social Work Department BRIEF PSYCHOSOCIAL ASSESSMENT 02/01/2015  Patient:  Amanda Wilkins, Amanda Wilkins     Account Number:  [id number]     Admit date:  01/22/2015  Clinical Social Worker:  Domenica Reamer, CLINICAL SOCIAL WORKER  Date/Time:  02/01/2015 12:00 N  Referred by:  Physician  Date Referred:  02/01/2015 Referred for  SNF Placement   Other Referral:   Interview type:  Patient Other interview type:    PSYCHOSOCIAL DATA Living Status:  HUSBAND Admitted from facility:   Level of care:   Primary support name:  [place] Primary support relationship to patient:  SPOUSE Degree of support available:   high level of support    CURRENT CONCERNS Current Concerns  Post-Acute Placement   Other Concerns:    SOCIAL WORK ASSESSMENT / PLAN CSW spoke with pt concerning SNF placement at time of DC. Pt is agreeable to SNF placement.  Pt reports that her husband helps her at home but she understand she will need a lot of assistance after this admission and wants to regain mobility before returning home.   Assessment/plan status:  Psychosocial Support/Ongoing Assessment of Needs Other assessment/ plan:   FL2  PASAR   Information/referral to community resources:    PATIENT'S/FAMILY'S RESPONSE TO PLAN OF CARE: Patient is agreeable to SNF placement and is hopeful that she can fully regain strength before returning home.       Domenica Reamer, [place] Social Worker [phone]

## 2015-02-01 NOTE — Progress Notes (Signed)
Patient ID: Amanda Wilkins, female   DOB: 1946-04-20, 69 y.o.   MRN: [id number] S:feels better but c/o "ulcers in my mouth" O:BP 160/62 mmHg  Pulse 75  Temp(Src) 98.7 F (37.1 C) (Oral)  Resp 18  Ht 5\' 6"  (1.676 m)  Wt 114 kg (251 lb 5.2 oz)  BMI 40.58 kg/m2  SpO2 100%  Intake/Output Summary (Last 24 hours) at 02/01/15 0925 Last data filed at 02/01/15 0844  Gross per 24 hour  Intake 1249.67 ml  Output   4950 ml  Net -3700.33 ml   Intake/Output: I/O last 3 completed shifts: In: 1249.7 [P.O.:460; I.V.:375.7; IV Piggyback:414] Out: 3950 [Urine:3950]  Intake/Output this shift:  Total I/O In: -  Out: 2500 [Urine:2500] Weight change: 0.3 kg (10.6 oz) Gen:WD obese WF in NAd CVS:no rub Resp:scattered rhonchi GMW:NUUVO, +BS Ext:1+ edema lower ext   Recent Labs Lab 01/26/15 0400 01/27/15 0420 01/28/15 0445 01/29/15 0713 01/30/15 0500 01/31/15 0555 02/01/15 0422  NA 126* 128* 126* 130* 133* 131* 129*  K 3.0* 3.6 3.2* 3.6 3.8 3.9 3.1*  CL 94* 94* 89* 90* 96 93* 90*  CO2 15* 19 21 24 26 26 26   GLUCOSE 111* 123* 143* 149* 113* 130* 105*  BUN 79* 83* 82* 48* 24* 33* 36*  CREATININE 4.66* 5.00* 5.21* 3.82* 2.68* 3.45* 3.81*  ALBUMIN  --   --   --  2.2* 2.2* 2.3* 2.2*  CALCIUM 7.4* 7.8* 7.9* 7.6* 7.8* 8.3* 8.3*  PHOS 5.6* 5.3*  --   --  4.6 5.9* 6.7*  AST  --   --   --  35  --   --   --   ALT  --   --   --  41*  --   --   --    Liver Function Tests:  Recent Labs Lab 01/29/15 0713 01/30/15 0500 01/31/15 0555 02/01/15 0422  AST 35  --   --   --   ALT 41*  --   --   --   ALKPHOS 109  --   --   --   BILITOT 0.6  --   --   --   PROT 5.7*  --   --   --   ALBUMIN 2.2* 2.2* 2.3* 2.2*   No results for input(s): LIPASE, AMYLASE in the last 168 hours. No results for input(s): AMMONIA in the last 168 hours. CBC:  Recent Labs Lab 01/28/15 0445 01/29/15 0713 01/30/15 0030 01/31/15 0555 02/01/15 0422  WBC 13.1* 11.8* 10.6* 14.2* 14.4*  HGB 9.4* 9.7* 9.5* 9.7*  9.4*  HCT 26.0* 27.0* 26.9* 28.2* 27.1*  MCV 92.2 94.4 95.7 97.9 97.8  PLT 105* 140* 162 203 207   Cardiac Enzymes: No results for input(s): CKTOTAL, CKMB, CKMBINDEX, TROPONINI in the last 168 hours. CBG:  Recent Labs Lab 01/26/15 1607 01/28/15 0755 01/28/15 1139  GLUCAP 128* 132* 132*    Iron Studies: No results for input(s): IRON, TIBC, TRANSFERRIN, FERRITIN in the last 72 hours. Studies/Results: No results found. [name] aspirin  325 mg Oral Daily  . benzonatate  200 mg Oral TID  . bisoprolol  5 mg Oral BID  . cefTRIAXone (ROCEPHIN)  IV  2 g Intravenous Q24H  . chlorpheniramine-HYDROcodone  5 mL Oral Q12H  . diltiazem  60 mg Oral 4 times per day  . estrogens (conjugated)  0.3 mg Oral Q48H  . famotidine  20 mg Oral QHS  . furosemide  160 mg Intravenous 3 times per day  .  heparin  5,000 Units Subcutaneous 3 times per day  . levalbuterol  0.63 mg Nebulization BID  . sertraline  50 mg Oral Daily    BMET    Component Value Date/Time   NA 129* 02/01/2015 0422   K 3.1* 02/01/2015 0422   CL 90* 02/01/2015 0422   CO2 26 02/01/2015 0422   GLUCOSE 105* 02/01/2015 0422   BUN 36* 02/01/2015 0422   CREATININE 3.81* 02/01/2015 0422   CREATININE 0.68 05/27/2014 0923   CALCIUM 8.3* 02/01/2015 0422   GFRNONAA 11* 02/01/2015 0422   GFRAA 13* 02/01/2015 0422   CBC    Component Value Date/Time   WBC 14.4* 02/01/2015 0422   RBC 2.77* 02/01/2015 0422   HGB 9.4* 02/01/2015 0422   HCT 27.1* 02/01/2015 0422   PLT 207 02/01/2015 0422   MCV 97.8 02/01/2015 0422   MCH 33.9 02/01/2015 0422   MCHC 34.7 02/01/2015 0422   RDW 14.1 02/01/2015 0422   LYMPHSABS 0.5* 01/24/2015 2043   MONOABS 0.4 01/24/2015 2043   EOSABS 0.1 01/24/2015 2043   BASOSABS 0.0 01/24/2015 2043    Assessment/Plan:  1. AKI/CKD in setting of sepsis. First HD on 01/28/15 due to pulmonary edema and SOB. She had a second session of HD on 01/29/15 but  1. Marked increase in UOP. Likely in diuretic phase of  recovery. 2. Hold off on further HD for now and follow closely.  3. Decrease dose of lasix and follow 2. Group A strep sepsis- ID following, on IV rocephin 3. Hypokalemia- replete and follow. 4. A fib with RVR- on dilt drip, Cardiology following. 5. Hyponatremia- follow with diuresis, will decrease lasix dose and frequency. 6. Oral ulcers- per primary svc. 7. Metabolic acidosis- resolved. 8. SIRS- improving 9. Anemia- follow and transfuse prn 10. DM- per primary svc  Ilisa Hayworth A

## 2015-02-01 NOTE — Clinical Social Work Placement (Signed)
Clinical Social Work Department CLINICAL SOCIAL WORK PLACEMENT NOTE 02/01/2015  Patient:  Amanda Wilkins, Amanda Wilkins  Account Number:  [id number] [place] date:  01/22/2015  Clinical Social Worker:  Domenica Reamer, CLINICAL SOCIAL WORKER  Date/time:  02/01/2015 04:30 PM  Clinical Social Work is seeking post-discharge placement for this patient at the following level of care:   SKILLED NURSING   (*CSW will update this form in Epic as items are completed)   02/01/2015  Patient/family provided with [place] Department of Clinical Social Work's list of facilities offering this level of care within the geographic area requested by the patient (or if unable, by the patient's family).  02/01/2015  Patient/family informed of their freedom to choose among providers that offer the needed level of care, that participate in Medicare, Medicaid or managed care program needed by the patient, have an available bed and are willing to accept the patient.  02/01/2015  Patient/family informed of [facility]' ownership interest in [facility], as well as of the fact that they are under no obligation to receive care at this facility.  PASARR submitted to EDS on 02/01/2015 PASARR number received on 02/01/2015  FL2 transmitted to all facilities in geographic area requested by pt/family on  02/01/2015 FL2 transmitted to all facilities within larger geographic area on   Patient informed that his/her managed care company has contracts with or will negotiate with  certain facilities, including the following:     Patient/family informed of bed offers received:   Patient chooses bed at  Physician recommends and patient chooses bed at    Patient to be transferred to  on   Patient to be transferred to facility by  Patient and family notified of transfer on  Name of family member notified:    The following physician request were entered in Epic:   Additional Comments: Domenica Reamer, [place] Social  Worker [phone]

## 2015-02-02 DIAGNOSIS — E876 Hypokalemia: Secondary | ICD-10-CM

## 2015-02-02 LAB — COMPREHENSIVE METABOLIC PANEL
ALT: 23 U/L (ref 0–35)
AST: 23 U/L (ref 0–37)
Albumin: 2.1 g/dL — ABNORMAL LOW (ref 3.5–5.2)
Alkaline Phosphatase: 57 U/L (ref 39–117)
Anion gap: 12 (ref 5–15)
BUN: 37 mg/dL — ABNORMAL HIGH (ref 6–23)
CALCIUM: 8.1 mg/dL — AB (ref 8.4–10.5)
CO2: 27 mmol/L (ref 19–32)
Chloride: 91 mmol/L — ABNORMAL LOW (ref 96–112)
Creatinine, Ser: 3.93 mg/dL — ABNORMAL HIGH (ref 0.50–1.10)
GFR calc non Af Amer: 11 mL/min — ABNORMAL LOW (ref >90)
GFR, EST AFRICAN AMERICAN: 13 mL/min — AB (ref >90)
Glucose, Bld: 110 mg/dL — ABNORMAL HIGH (ref 70–99)
Potassium: 3 mmol/L — ABNORMAL LOW (ref 3.5–5.1)
SODIUM: 130 mmol/L — AB (ref 135–145)
TOTAL PROTEIN: 5.2 g/dL — AB (ref 6.0–8.3)
Total Bilirubin: 0.5 mg/dL (ref 0.3–1.2)

## 2015-02-02 LAB — CBC WITH DIFFERENTIAL/PLATELET
BASOS PCT: 0 % (ref 0–1)
Basophils Absolute: 0 10*3/uL (ref 0.0–0.1)
Eosinophils Absolute: 0.1 10*3/uL (ref 0.0–0.7)
Eosinophils Relative: 1 % (ref 0–5)
HCT: 26.6 % — ABNORMAL LOW (ref 36.0–46.0)
Hemoglobin: 9.3 g/dL — ABNORMAL LOW (ref 12.0–15.0)
LYMPHS ABS: 1.8 10*3/uL (ref 0.7–4.0)
Lymphocytes Relative: 16 % (ref 12–46)
MCH: 33.6 pg (ref 26.0–34.0)
MCHC: 35 g/dL (ref 30.0–36.0)
MCV: 96 fL (ref 78.0–100.0)
MONOS PCT: 4 % (ref 3–12)
Monocytes Absolute: 0.5 10*3/uL (ref 0.1–1.0)
NEUTROS ABS: 8.8 10*3/uL — AB (ref 1.7–7.7)
Neutrophils Relative %: 79 % — ABNORMAL HIGH (ref 43–77)
Platelets: 211 10*3/uL (ref 150–400)
RBC: 2.77 MIL/uL — ABNORMAL LOW (ref 3.87–5.11)
RDW: 13.8 % (ref 11.5–15.5)
WBC: 11.3 10*3/uL — ABNORMAL HIGH (ref 4.0–10.5)

## 2015-02-02 LAB — HEMOGLOBIN A1C
Hgb A1c MFr Bld: 5.8 % — ABNORMAL HIGH (ref 4.8–5.6)
Mean Plasma Glucose: 120 mg/dL

## 2015-02-02 LAB — MAGNESIUM: MAGNESIUM: 1.6 mg/dL (ref 1.5–2.5)

## 2015-02-02 LAB — CLOSTRIDIUM DIFFICILE BY PCR: Toxigenic C. Difficile by PCR: NEGATIVE

## 2015-02-02 MED ORDER — LEVALBUTEROL HCL 0.63 MG/3ML IN NEBU: 0.6300 mg | INHALATION_SOLUTION | Freq: Four times a day (QID) | RESPIRATORY_TRACT | Status: DC | PRN

## 2015-02-02 MED ORDER — MAGIC MOUTHWASH W/LIDOCAINE
5.0000 mL | Freq: Three times a day (TID) | ORAL | Status: DC | PRN
  Administered 2015-02-02 – 2015-02-08 (×3): 5 mL via ORAL
  Filled 2015-02-02 (×6): qty 5

## 2015-02-02 MED ORDER — FUROSEMIDE 10 MG/ML IJ SOLN
80.0000 mg | Freq: Every day | INTRAMUSCULAR | Status: DC
  Administered 2015-02-03: 80 mg via INTRAVENOUS
  Filled 2015-02-02: qty 8

## 2015-02-02 NOTE — Progress Notes (Signed)
Patient ID: Amanda Wilkins, female   DOB: [birth year], 69 y.o.   MRN: [id number] S:still complaining of throat and mouth pain O:BP 118/57 mmHg  Pulse 103  Temp(Src) 98.3 F (36.8 C) (Oral)  Resp 22  Ht 5\' 6"  (1.676 m)  Wt 103.3 kg (227 lb 11.8 oz)  BMI 36.77 kg/m2  SpO2 93%  Intake/Output Summary (Last 24 hours) at 02/02/15 1102 Last data filed at 02/02/15 0942  Gross per 24 hour  Intake    150 ml  Output   3625 ml  Net  -3475 ml   Intake/Output: I/O last 3 completed shifts: In: 1079.7 [P.O.:240; I.V.:375.7; IV Piggyback:464] Out: 6425 [Urine:6425]  Intake/Output this shift:  Total I/O In: 100 [P.O.:100] Out: -  Weight change: -10.7 kg (-23 lb 9.4 oz) Gen: WD obese WF in NAD KXF:GHWEX Resp: scattered rhonchi Abd:+BS, soft, NT Ext:1+    Recent Labs Lab 01/27/15 0420 01/28/15 0445 01/29/15 0713 01/30/15 0500 01/31/15 0555 02/01/15 0422 02/02/15 0230  NA 128* 126* 130* 133* 131* 129* 130*  K 3.6 3.2* 3.6 3.8 3.9 3.1* 3.0*  CL 94* 89* 90* 96 93* 90* 91*  CO2 19 21 24 26 26 26 27   GLUCOSE 123* 143* 149* 113* 130* 105* 110*  BUN 83* 82* 48* 24* 33* 36* 37*  CREATININE 5.00* 5.21* 3.82* 2.68* 3.45* 3.81* 3.93*  ALBUMIN  --   --  2.2* 2.2* 2.3* 2.2* 2.1*  CALCIUM 7.8* 7.9* 7.6* 7.8* 8.3* 8.3* 8.1*  PHOS 5.3*  --   --  4.6 5.9* 6.7*  --   AST  --   --  35  --   --   --  23  ALT  --   --  41*  --   --   --  23   Liver Function Tests:  Recent Labs Lab 01/29/15 0713  01/31/15 0555 02/01/15 0422 02/02/15 0230  AST 35  --   --   --  23  ALT 41*  --   --   --  23  ALKPHOS 109  --   --   --  57  BILITOT 0.6  --   --   --  0.5  PROT 5.7*  --   --   --  5.2*  ALBUMIN 2.2*  < > 2.3* 2.2* 2.1*  < > = values in this interval not displayed. No results for input(s): LIPASE, AMYLASE in the last 168 hours. No results for input(s): AMMONIA in the last 168 hours. CBC:  Recent Labs Lab 01/29/15 0713 01/30/15 0030 01/31/15 0555 02/01/15 0422 02/02/15 0230  WBC  11.8* 10.6* 14.2* 14.4* 11.3*  NEUTROABS  --   --   --   --  8.8*  HGB 9.7* 9.5* 9.7* 9.4* 9.3*  HCT 27.0* 26.9* 28.2* 27.1* 26.6*  MCV 94.4 95.7 97.9 97.8 96.0  PLT 140* 162 203 207 211   Cardiac Enzymes: No results for input(s): CKTOTAL, CKMB, CKMBINDEX, TROPONINI in the last 168 hours. CBG:  Recent Labs Lab 01/26/15 1607 01/28/15 0755 01/28/15 1139  GLUCAP 128* 132* 132*    Iron Studies: No results for input(s): IRON, TIBC, TRANSFERRIN, FERRITIN in the last 72 hours. Studies/Results: No results found. [name] aspirin  325 mg Oral Daily  . benzonatate  200 mg Oral TID  . bisoprolol  5 mg Oral BID  . cefTRIAXone (ROCEPHIN)  IV  2 g Intravenous Q24H  . chlorpheniramine-HYDROcodone  5 mL Oral Q12H  . diltiazem  60 mg Oral  4 times per day  . estrogens (conjugated)  0.3 mg Oral Q48H  . famotidine  20 mg Oral QHS  . furosemide  80 mg Intravenous BID  . heparin  5,000 Units Subcutaneous 3 times per day  . levalbuterol  0.63 mg Nebulization BID  . potassium chloride  20 mEq Oral Daily  . sertraline  50 mg Oral Daily  . valACYclovir  1,000 mg Oral Daily    BMET    Component Value Date/Time   NA 130* 02/02/2015 0230   K 3.0* 02/02/2015 0230   CL 91* 02/02/2015 0230   CO2 27 02/02/2015 0230   GLUCOSE 110* 02/02/2015 0230   BUN 37* 02/02/2015 0230   CREATININE 3.93* 02/02/2015 0230   CREATININE 0.68 05/27/2014 0923   CALCIUM 8.1* 02/02/2015 0230   GFRNONAA 11* 02/02/2015 0230   GFRAA 13* 02/02/2015 0230   CBC    Component Value Date/Time   WBC 11.3* 02/02/2015 0230   RBC 2.77* 02/02/2015 0230   HGB 9.3* 02/02/2015 0230   HCT 26.6* 02/02/2015 0230   PLT 211 02/02/2015 0230   MCV 96.0 02/02/2015 0230   MCH 33.6 02/02/2015 0230   MCHC 35.0 02/02/2015 0230   RDW 13.8 02/02/2015 0230   LYMPHSABS 1.8 02/02/2015 0230   MONOABS 0.5 02/02/2015 0230   EOSABS 0.1 02/02/2015 0230   BASOSABS 0.0 02/02/2015 0230     Assessment/Plan:  1. AKI/CKD in setting of sepsis.  First HD on 01/28/15 due to pulmonary edema and SOB. She had a second session of HD on 01/29/15 but  1. Marked increase in UOP. Likely in diuretic phase of recovery. 2. Hold off on further HD for now and follow closely.  3. Decreased dose of lasix yesterday and will decrease again today and follow 2. Group A strep sepsis- ID following, on IV rocephin 3. Hypokalemia- replete and follow. 4. A fib with RVR- on dilt drip, Cardiology following. 5. Hyponatremia- follow with diuresis, will decrease lasix dose and frequency. 6. Oral ulcers- per primary svc. 7. Metabolic acidosis- resolved. 8. SIRS- improving 9. Anemia- follow and transfuse prn 10. DM- per primary svc  Tiye Huwe A

## 2015-02-02 NOTE — Progress Notes (Signed)
Subjective:  C/o cough; no chst pain  Objective:   Vital Signs : Filed Vitals:   02/02/15 0331 02/02/15 0500 02/02/15 0535 02/02/15 0800  BP: 153/49  136/106 118/57  Pulse: 118   138  Temp: 98 F (36.7 C)     TempSrc: Oral     Resp: 22   20  Height:      Weight:  227 lb 11.8 oz (103.3 kg)    SpO2: 97%   96%    Intake/Output from previous day:  Intake/Output Summary (Last 24 hours) at 02/02/15 0915 Last data filed at 02/02/15 0500  Gross per 24 hour  Intake     50 ml  Output   3625 ml  Net  -3575 ml    I/O since admission: -7647  Wt Readings from Last 3 Encounters:  02/02/15 227 lb 11.8 oz (103.3 kg)  01/04/15 229 lb (103.874 kg)  12/16/14 226 lb (102.513 kg)    Medications: . aspirin  325 mg Oral Daily  . benzonatate  200 mg Oral TID  . bisoprolol  5 mg Oral BID  . cefTRIAXone (ROCEPHIN)  IV  2 g Intravenous Q24H  . chlorpheniramine-HYDROcodone  5 mL Oral Q12H  . diltiazem  60 mg Oral 4 times per day  . estrogens (conjugated)  0.3 mg Oral Q48H  . famotidine  20 mg Oral QHS  . furosemide  80 mg Intravenous BID  . heparin  5,000 Units Subcutaneous 3 times per day  . levalbuterol  0.63 mg Nebulization BID  . potassium chloride  20 mEq Oral Daily  . sertraline  50 mg Oral Daily  . valACYclovir  1,000 mg Oral Daily       Physical Exam:   General appearance: alert, cooperative and no distress Skin: erythematous rash now appears less intense but blisters present Neck: no carotid bruit, no JVD, supple, symmetrical, trachea midline and thyroid not enlarged, symmetric, no tenderness/mass/nodules Lungs: fainst end exp wheeze, intermittent Heart: irregularly irregular rhythm, rate 115 better, 1/6 sem Abdomen: obese; soft, non-tender; bowel sounds normal; no masses,  no organomegaly Extremities: 1+ edema Pulses: 2+ and symmetric Neurologic: Grossly normal   Rate: 95-115  Rhythm: atrial fibrillation  ECG (independently read by me): last from 01/22/15: AF  with RVR at 141; NSSTT changes; increased QTc 01/31/2015:  AF at 127; inc QTc at 473  Lab Results:  BMP Latest Ref Rng 02/02/2015 02/01/2015 01/31/2015  Glucose 70 - 99 mg/dL 110(H) 105(H) 130(H)  BUN 6 - 23 mg/dL 37(H) 36(H) 33(H)  Creatinine 0.50 - 1.10 mg/dL 3.93(H) 3.81(H) 3.45(H)  Sodium 135 - 145 mmol/L 130(L) 129(L) 131(L)  Potassium 3.5 - 5.1 mmol/L 3.0(L) 3.1(L) 3.9  Chloride 96 - 112 mmol/L 91(L) 90(L) 93(L)  CO2 19 - 32 mmol/L 27 26 26   Calcium 8.4 - 10.5 mg/dL 8.1(L) 8.3(L) 8.3(L)     CBC Latest Ref Rng 02/02/2015 02/01/2015 01/31/2015  WBC 4.0 - 10.5 K/uL 11.3(H) 14.4(H) 14.2(H)  Hemoglobin 12.0 - 15.0 g/dL 9.3(L) 9.4(L) 9.7(L)  Hematocrit 36.0 - 46.0 % 26.6(L) 27.1(L) 28.2(L)  Platelets 150 - 400 K/uL 211 207 203   Hepatic Function Latest Ref Rng 02/02/2015 02/01/2015 01/31/2015  Total Protein 6.0 - 8.3 g/dL 5.2(L) - -  Albumin 3.5 - 5.2 g/dL 2.1(L) 2.2(L) 2.3(L)  AST 0 - 37 U/L 23 - -  ALT 0 - 35 U/L 23 - -  Alk Phosphatase 39 - 117 U/L 57 - -  Total Bilirubin 0.3 - 1.2 mg/dL  0.5 - -  Bilirubin, Direct 0.0 - 0.3 mg/dL - - -     No results for input(s): TROPONINI in the last 72 hours.  Invalid input(s): CK, MB   No results for input(s): INR in the last 72 hours.  ProBNP (last 3 results)  Recent Labs  09/22/14 1755 11/11/14 0943  PROBNP 479.7* 173.0*     Lipid Panel     Component Value Date/Time   CHOL 178 11/11/2014 0943   TRIG 160.0* 11/11/2014 0943   HDL 68.70 11/11/2014 0943   CHOLHDL 3 11/11/2014 0943   VLDL 32.0 11/11/2014 0943   LDLCALC 77 11/11/2014 0943      Imaging:  No results found.  Echocardiogram 01/25/2015: Study Conclusions  - Left ventricle: The cavity size was normal. Wall thickness was normal. Systolic function was normal. The estimated ejection fraction was in the range of 55% to 60%. Wall motion was normal; there were no regional wall motion abnormalities. - Mitral valve: Calcified annulus. There was mild  regurgitation. - Left atrium: The atrium was mildly dilated. - Pulmonary arteries: Systolic pressure was mildly increased. PA peak pressure: 32 mm Hg (S).  Impressions:  - Normal LV function; mild LAE; mild MR and TR; mildly elevated pulmonary pressure.   Assessment/Plan:   Principal Problem:   Atrial fibrillation with RVR Active Problems:   Palpitations   Upper respiratory disease   Dehydration   Breast pain, right   A-fib   Fever   Nausea & vomiting   Sepsis   Acute renal insufficiency   Abdominal pain   Axillary pain   Dyspnea   Hypoxia   Thrombocytopenia   Transaminitis   Hyperbilirubinemia   FUO (fever of unknown origin)   Septic shock   AKI (acute kidney injury)   Cholecystitis   DIC (disseminated intravascular coagulation)   Group A streptococcal infection   Cellulitis   D-dimer, elevated   Severe sepsis with septic shock   Acute respiratory failure   HSV-1 (herpes simplex virus 1) infection   Gallstones   Shoulder pain   Septic shock due to streptococcal infection   Atelectasis   Acute pulmonary edema   Persistent atrial fibrillation   Acute kidney injury   Hyponatremia   Hypokalemia   Hyperglycemia   Elevated d-dimer   OSA (obstructive sleep apnea)  1. Persistent AF: Ventricular rate improved now on cardizem 60 mg every 6 hrs and Bisoprolol  5 mg bid. Consider initiating anticoagulation if risk of bleeding is reduced from comorbidities. 2. AKI: Cr increased further to  to 3.93; now with significant diuresis probably in recovery phase; No further dialysis followed by renal felt mediated by AF, hypotension, bacteremia, Cox-2 inhib.  Now requiring less lasix 80 mg bid; now with inc UOP; last HD on 4/16 3. Septic shock Group A strep 4. Anemia 5. Hyponatremia: today 3.0; replete to 4.0 6. Possible inferior/inferolateral ischemia on prior myoview. No chest pain, negative cardiac enzymes. Would defer further eval presently with renal and additional  significant comorbidities. 7. Erythematous rash, less intense. with blistering 8. DM    Troy Sine, MD, [facility] 02/02/2015, 9:15 AM

## 2015-02-02 NOTE — Progress Notes (Signed)
Physical Therapy Treatment Patient Details Name: Amanda Wilkins MRN: [id number] DOB: [birth year] Today's Date: 02/02/2015    History of Present Illness Adm 01/22/15 with n/v, general malaise, diarrhea, fever, and Rt flank pain (which turned into Group A Strep infection); sepsis with hypotension, afib with RVR; 4/15 fluid overload with plans for HD cath/HD  PMHx-afib, OA, obesity    PT Comments    Pt admitted with above diagnosis. Pt currently with functional limitations due to balance and endurance deficits as well as limited by HR. Pt will benefit from continued therapy.   Pt will benefit from skilled PT to increase their independence and safety with mobility to allow discharge to the venue listed below.    Follow Up Recommendations  SNF;Supervision for mobility/OOB     Equipment Recommendations  Rolling walker with 5" wheels    Recommendations for Other Services       Precautions / Restrictions Precautions Precautions: Fall Precaution Comments: contact Restrictions Weight Bearing Restrictions: No    Mobility  Bed Mobility Overal bed mobility: Independent                Transfers Overall transfer level: Needs assistance Equipment used: Rolling walker (2 wheeled) Transfers: Sit to/from Stand Sit to Stand: Min guard         General transfer comment: cues for hand placement and safety  Ambulation/Gait Ambulation/Gait assistance: Min guard Ambulation Distance (Feet): 50 Feet Assistive device: Rolling walker (2 wheeled) Gait Pattern/deviations: Step-through pattern;Wide base of support   Gait velocity interpretation: Below normal speed for age/gender General Gait Details: limited by incr HR to 145 bpm from 101.  HR flucuating while ambulating.  Shortened walk and perfomred exercise with pt.     Stairs            Wheelchair Mobility    Modified Rankin (Stroke Patients Only)       Balance Overall balance assessment: Needs  assistance Sitting-balance support: No upper extremity supported;Feet supported Sitting balance-Leahy Scale: Good     Standing balance support: Bilateral upper extremity supported;During functional activity Standing balance-Leahy Scale: Fair Standing balance comment: can stand statically without UE support.                     Cognition Arousal/Alertness: Awake/alert Behavior During Therapy: WFL for tasks assessed/performed Overall Cognitive Status: Within Functional Limits for tasks assessed                      Exercises General Exercises - Lower Extremity Ankle Circles/Pumps: AROM;Both;20 reps;Supine Long Arc Quad: AROM;Both;15 reps;Seated Hip ABduction/ADduction: AROM;Seated;Both;15 reps;Supine Hip Flexion/Marching: AROM;Both;15 reps;Seated    General Comments        Pertinent Vitals/Pain Pain Assessment: No/denies pain  HR 101-145 bpm with activity.      Home Living                      Prior Function            PT Goals (current goals can now be found in the care plan section) Progress towards PT goals: Progressing toward goals    Frequency  Min 3X/week    PT Plan Current plan remains appropriate    Co-evaluation             End of Session Equipment Utilized During Treatment: Gait belt Activity Tolerance: Patient tolerated treatment well Patient left: in chair;with call bell/phone within reach     Time: 1041-1104 PT Time Calculation (min) (  ACUTE ONLY): 23 min  Charges:  $Gait Training: 8-22 mins $Therapeutic Exercise: 8-22 mins                    G Codes:      Berline Lopes 2015/02/26, 12:22 PM Jimmie Dattilio,PT Acute Rehabilitation [phone] (pager)

## 2015-02-02 NOTE — Progress Notes (Signed)
[place] TEAM 1 - Stepdown/ICU TEAM Progress Note  Amanda Wilkins ZOX:[id number] DOB: 06-Sep-1946 DOA: 01/22/2015 PCP: Sonda Primes, MD  Admit HPI / Brief Narrative: 69 yo F w/ h/o pafib and htn who presented w/ 2 days of n/v, general malaise, diarrhea, and fever. When she started having palpitations she came to ED. She had vomited several times and was unable to hold down her home meds.  Significant Events: 4/09 Admit with N/V/D - lactic acid 3.6 4/10 CT Chest > inflammation in R axilla extending inferiorly along R chest, no focal evidence of abscess or gas, mild dependent atx 4/11 CT Abd/Pelvis > no renal obs, Gallbladder distention with possible internal gallstone identified 4/11 Renal US > no hydronephrosis, cholelithiasis, small L pleural effusion  4/11 PCCM consulted for sepsis. Lactic acid 1.4 4/12 Hypotensive on dilt gtt, tachy on dopamine gtt, some resp distress with fluids 4/12 LE Duplex > neg for DVT 4/12 RUE Duplex > neg for DVT 4/13 Off pressors 4/13 HIDA Scan > delayed filling of gallbladder, but the gallbladder fills after morphine administration, no evidence of biliary obstruction 4/14 Desaturated into mid 80's on RA while sleeping  HPI/Subjective: The patient has no new complaints today.  She denies any pain in her right shoulder.  She denies fevers chills nausea or vomiting.  She admits that she feels extremely weak when she attempts to ambulate.  Assessment/Plan:  Septic shock due to Group A strep cellulitis / chest wall induration w/ bacteremia  ID following - MRI of flank did not reveal a focal pocket of infection/abscess - abx per ID Team - pt had noted shoulder pain worrisome for septic arthritis - ID ordered MRI of shoulder but pt decided she did not want this done (she was counseled on the repercussions of missing a septic arthritis) - continue antibiotics as per ID with plans to stop after the last dose on 4/24  ?Viral Gastroenteritis -  doubt Suspect her initial GI symptoms were likely due to her sepsis/severe cellulitis - symptoms resolved  Persistent dependent Atelectasis  Mobilize as able - incentive spirometer  Acute pulm edema medical management has been difficult due to acute kidney failure - presently the patient is stable in this regard - she has been weaned to room air when at rest  Persistent Atrial Fibrillation  Cardiology following - rate remains reasonably controlled - CHADS2VASC 3 - Cardiology team now suggests resuming anticoagulation - has been successfully weaned from IV Cardizem - we will need to discuss initiating Coumadin with patient and if she approves we will dose without IV heparin overlap - not an appropriate candidate for NOAC due to current renal failure  Acute Kidney Injury  hemodynamically mediated due to afib, hypotension, bacteremia, COX-2 inhib - Nephrology continues to follow - UOP picking up - no immediate plans for further hemodialysis at this time with close monitoring of labs to continue - not yet ready to remove hemodialysis catheter  HSV1 exacerbation Appears to be resolving - acyclovir initiated 4/19  Hyponatremia due to kidney failure - follow   Thrombocytopenia Probable DIC - platelet count has now normalized  Hyperglycemia previous A1c's have been normal - recheck also normal at 5.8  Abnormal Myoview  Obtained in March 2016 - Cards noted further eval will be required when more stable - patient is asymptomatic   Obesity - Body mass index is 36.77 kg/(m^2).  High D-dimer doubt PE, will not scan given renal function - venous duplex neg for DVT - will be on  anticoagulation for atrial fibrillation nonetheless  Suspected OSA Saturations remaining stable - will need outpatient sleep study   Code Status: FULL Family Communication: Spoke with son at bedside for an extended period of time Disposition Plan: Stable for transfer to telemetry bed - discus Coumadin with patient and  consider dosing within next 24-48 hours - follow renal function - ultimate disposition will be discharge to SNF for rehabilitation stay  Consultants: Cardiology  ID Nephrology   Antibiotics: Ceftriaxone 4/13 >  Clindamycin 4/12 > 4/16  DVT prophylaxis: Subcutaneous heparin  Objective: Blood pressure 190/69, pulse 98, temperature 98.8 F (37.1 C), temperature source Axillary, resp. rate 17, height 5\' 6"  (1.676 m), weight 103.3 kg (227 lb 11.8 oz), SpO2 94 %.  Intake/Output Summary (Last 24 hours) at 02/02/15 1508 Last data filed at 02/02/15 1313  Gross per 24 hour  Intake    150 ml  Output   3825 ml  Net  -3675 ml   Exam: General: No acute respiratory distress - alert and oriented Lungs: bibasilar crackles but no wheeze - distant breath sounds  Cardiovascular: distant heart sounds, regular rate, no appreciable murmur  Abdomen: Nontender, obese, soft, bowel sounds positive, no rebound, no ascites, no appreciable mass Extremities: No significant cyanosis, clubbing w/ 1+ edema bilateral lower extremities   Data Reviewed: Basic Metabolic Panel:  Recent Labs Lab 01/27/15 0420  01/29/15 0713 01/30/15 0500 01/31/15 0555 02/01/15 0422 02/02/15 0230  NA 128*  < > 130* 133* 131* 129* 130*  K 3.6  < > 3.6 3.8 3.9 3.1* 3.0*  CL 94*  < > 90* 96 93* 90* 91*  CO2 19  < > 24 26 26 26 27   GLUCOSE 123*  < > 149* 113* 130* 105* 110*  BUN 83*  < > 48* 24* 33* 36* 37*  CREATININE 5.00*  < > 3.82* 2.68* 3.45* 3.81* 3.93*  CALCIUM 7.8*  < > 7.6* 7.8* 8.3* 8.3* 8.1*  MG 2.6*  --   --   --   --   --  1.6  PHOS 5.3*  --   --  4.6 5.9* 6.7*  --   < > = values in this interval not displayed.  Liver Function Tests:  Recent Labs Lab 01/29/15 0713 01/30/15 0500 01/31/15 0555 02/01/15 0422 02/02/15 0230  AST 35  --   --   --  23  ALT 41*  --   --   --  23  ALKPHOS 109  --   --   --  57  BILITOT 0.6  --   --   --  0.5  PROT 5.7*  --   --   --  5.2*  ALBUMIN 2.2* 2.2* 2.3* 2.2* 2.1*     CBC:  Recent Labs Lab 01/29/15 0713 01/30/15 0030 01/31/15 0555 02/01/15 0422 02/02/15 0230  WBC 11.8* 10.6* 14.2* 14.4* 11.3*  NEUTROABS  --   --   --   --  8.8*  HGB 9.7* 9.5* 9.7* 9.4* 9.3*  HCT 27.0* 26.9* 28.2* 27.1* 26.6*  MCV 94.4 95.7 97.9 97.8 96.0  PLT 140* 162 203 207 211    CBG:  Recent Labs Lab 01/26/15 1607 01/28/15 0755 01/28/15 1139  GLUCAP 128* 132* 132*    Recent Results (from the past 240 hour(s))  Clostridium Difficile by PCR     Status: None   Collection Time: 01/24/15 12:59 PM  Result Value Ref Range Status   C difficile by pcr NEGATIVE NEGATIVE Final  Culture, Urine     Status: None   Collection Time: 01/24/15  2:02 PM  Result Value Ref Range Status   Specimen Description URINE, CATHETERIZED  Final   Special Requests NONE  Final   Colony Count NO GROWTH Performed at [facility]   Final   Culture NO GROWTH Performed at [facility]   Final   Report Status 01/26/2015 FINAL  Final  MRSA PCR Screening     Status: None   Collection Time: 01/24/15  4:30 PM  Result Value Ref Range Status   MRSA by PCR NEGATIVE NEGATIVE Final    Comment:        The GeneXpert MRSA Assay (FDA approved for NASAL specimens only), is one component of a comprehensive MRSA colonization surveillance program. It is not intended to diagnose MRSA infection nor to guide or monitor treatment for MRSA infections.   Clostridium Difficile by PCR     Status: None   Collection Time: 02/01/15  4:59 PM  Result Value Ref Range Status   C difficile by pcr NEGATIVE NEGATIVE Final    Studies:   Recent x-ray studies have been reviewed in detail by the Attending Physician  Scheduled Meds:  Scheduled Meds: . aspirin  325 mg Oral Daily  . benzonatate  200 mg Oral TID  . bisoprolol  5 mg Oral BID  . cefTRIAXone (ROCEPHIN)  IV  2 g Intravenous Q24H  . chlorpheniramine-HYDROcodone  5 mL Oral Q12H  . diltiazem  60 mg Oral 4 times per day  . estrogens  (conjugated)  0.3 mg Oral Q48H  . famotidine  20 mg Oral QHS  . [START ON 02/03/2015] furosemide  80 mg Intravenous Daily  . heparin  5,000 Units Subcutaneous 3 times per day  . potassium chloride  20 mEq Oral Daily  . sertraline  50 mg Oral Daily  . valACYclovir  1,000 mg Oral Daily    Time spent on care of this patient: 35 mins   Sharai Overbay T , MD   [facility] Office  [phone] Pager - Text Page per Loretha Stapler as per below:  On-Call/Text Page:      [email or web address]      password [email or web address]  If 7PM-7AM, please contact night-coverage [email or web address] Password [email or web address] 02/02/2015, 3:08 PM   LOS: 10 days

## 2015-02-03 DIAGNOSIS — I482 Chronic atrial fibrillation: Secondary | ICD-10-CM

## 2015-02-03 DIAGNOSIS — J810 Acute pulmonary edema: Secondary | ICD-10-CM

## 2015-02-03 LAB — RENAL FUNCTION PANEL
Albumin: 2.3 g/dL — ABNORMAL LOW (ref 3.5–5.2)
Anion gap: 11 (ref 5–15)
BUN: 39 mg/dL — ABNORMAL HIGH (ref 6–23)
CALCIUM: 8.2 mg/dL — AB (ref 8.4–10.5)
CO2: 28 mmol/L (ref 19–32)
CREATININE: 3.87 mg/dL — AB (ref 0.50–1.10)
Chloride: 93 mmol/L — ABNORMAL LOW (ref 96–112)
GFR calc Af Amer: 13 mL/min — ABNORMAL LOW (ref >90)
GFR calc non Af Amer: 11 mL/min — ABNORMAL LOW (ref >90)
Glucose, Bld: 114 mg/dL — ABNORMAL HIGH (ref 70–99)
Phosphorus: 5.9 mg/dL — ABNORMAL HIGH (ref 2.3–4.6)
Potassium: 2.9 mmol/L — ABNORMAL LOW (ref 3.5–5.1)
Sodium: 132 mmol/L — ABNORMAL LOW (ref 135–145)

## 2015-02-03 LAB — CBC
HEMATOCRIT: 27 % — AB (ref 36.0–46.0)
HEMOGLOBIN: 9.4 g/dL — AB (ref 12.0–15.0)
MCH: 33.2 pg (ref 26.0–34.0)
MCHC: 34.8 g/dL (ref 30.0–36.0)
MCV: 95.4 fL (ref 78.0–100.0)
Platelets: 257 10*3/uL (ref 150–400)
RBC: 2.83 MIL/uL — AB (ref 3.87–5.11)
RDW: 13.8 % (ref 11.5–15.5)
WBC: 9.9 10*3/uL (ref 4.0–10.5)

## 2015-02-03 MED ORDER — POTASSIUM CHLORIDE 10 MEQ/100ML IV SOLN
10.0000 meq | INTRAVENOUS | Status: AC
  Administered 2015-02-03 (×3): 10 meq via INTRAVENOUS
  Filled 2015-02-03 (×3): qty 100

## 2015-02-03 MED ORDER — POTASSIUM CHLORIDE CRYS ER 20 MEQ PO TBCR
40.0000 meq | EXTENDED_RELEASE_TABLET | Freq: Once | ORAL | Status: AC
  Administered 2015-02-03: 40 meq via ORAL
  Filled 2015-02-03: qty 2

## 2015-02-03 MED ORDER — ACYCLOVIR 5 % EX OINT
1.0000 "application " | TOPICAL_OINTMENT | CUTANEOUS | Status: DC
  Administered 2015-02-03 – 2015-02-08 (×29): 1 via TOPICAL
  Filled 2015-02-03: qty 30

## 2015-02-03 MED ORDER — MENTHOL 3 MG MT LOZG
1.0000 | LOZENGE | OROMUCOSAL | Status: DC | PRN
  Filled 2015-02-03: qty 9

## 2015-02-03 MED ORDER — FUROSEMIDE 10 MG/ML IJ SOLN: 40.0000 mg | Freq: Every day | INTRAMUSCULAR | Status: DC

## 2015-02-03 NOTE — Progress Notes (Signed)
Patient ID: Amanda Wilkins, female   DOB: 13-Dec-1945, 69 y.o.   MRN: [id number] S:feeling better O:BP 132/70 mmHg  Pulse 108  Temp(Src) 98 F (36.7 C) (Oral)  Resp 18  Ht 5\' 6"  (1.676 m)  Wt 105 kg (231 lb 7.7 oz)  BMI 37.38 kg/m2  SpO2 96%  Intake/Output Summary (Last 24 hours) at 02/03/15 1320 Last data filed at 02/03/15 0700  Gross per 24 hour  Intake    150 ml  Output   1100 ml  Net   -950 ml   Intake/Output: I/O last 3 completed shifts: In: 250 [P.O.:250] Out: 4375 [Urine:4375]  Intake/Output this shift:    Weight change: 1.7 kg (3 lb 12 oz) Gen:wd obese WF in NAD CVS:no rub Resp:cta WUJ:WJXBJY Ext:no edema   Recent Labs Lab 01/28/15 0445 01/29/15 0713 01/30/15 0500 01/31/15 0555 02/01/15 0422 02/02/15 0230 02/03/15 0440  NA 126* 130* 133* 131* 129* 130* 132*  K 3.2* 3.6 3.8 3.9 3.1* 3.0* 2.9*  CL 89* 90* 96 93* 90* 91* 93*  CO2 21 24 26 26 26 27 28   GLUCOSE 143* 149* 113* 130* 105* 110* 114*  BUN 82* 48* 24* 33* 36* 37* 39*  CREATININE 5.21* 3.82* 2.68* 3.45* 3.81* 3.93* 3.87*  ALBUMIN  --  2.2* 2.2* 2.3* 2.2* 2.1* 2.3*  CALCIUM 7.9* 7.6* 7.8* 8.3* 8.3* 8.1* 8.2*  PHOS  --   --  4.6 5.9* 6.7*  --  5.9*  AST  --  35  --   --   --  23  --   ALT  --  41*  --   --   --  23  --    Liver Function Tests:  Recent Labs Lab 01/29/15 0713  02/01/15 0422 02/02/15 0230 02/03/15 0440  AST 35  --   --  23  --   ALT 41*  --   --  23  --   ALKPHOS 109  --   --  57  --   BILITOT 0.6  --   --  0.5  --   PROT 5.7*  --   --  5.2*  --   ALBUMIN 2.2*  < > 2.2* 2.1* 2.3*  < > = values in this interval not displayed. No results for input(s): LIPASE, AMYLASE in the last 168 hours. No results for input(s): AMMONIA in the last 168 hours. CBC:  Recent Labs Lab 01/30/15 0030 01/31/15 0555 02/01/15 0422 02/02/15 0230 02/03/15 0456  WBC 10.6* 14.2* 14.4* 11.3* 9.9  NEUTROABS  --   --   --  8.8*  --   HGB 9.5* 9.7* 9.4* 9.3* 9.4*  HCT 26.9* 28.2* 27.1*  26.6* 27.0*  MCV 95.7 97.9 97.8 96.0 95.4  PLT 162 203 207 211 257   Cardiac Enzymes: No results for input(s): CKTOTAL, CKMB, CKMBINDEX, TROPONINI in the last 168 hours. CBG:  Recent Labs Lab 01/28/15 0755 01/28/15 1139  GLUCAP 132* 132*    Iron Studies: No results for input(s): IRON, TIBC, TRANSFERRIN, FERRITIN in the last 72 hours. Studies/Results: No results found. [name] acyclovir ointment  1 application Topical Q3H  . aspirin  325 mg Oral Daily  . benzonatate  200 mg Oral TID  . bisoprolol  5 mg Oral BID  . cefTRIAXone (ROCEPHIN)  IV  2 g Intravenous Q24H  . chlorpheniramine-HYDROcodone  5 mL Oral Q12H  . diltiazem  60 mg Oral 4 times per day  . estrogens (conjugated)  0.3 mg Oral Q48H  .  famotidine  20 mg Oral QHS  . furosemide  80 mg Intravenous Daily  . heparin  5,000 Units Subcutaneous 3 times per day  . potassium chloride  40 mEq Oral Once  . sertraline  50 mg Oral Daily  . valACYclovir  1,000 mg Oral Daily    BMET    Component Value Date/Time   NA 132* 02/03/2015 0440   K 2.9* 02/03/2015 0440   CL 93* 02/03/2015 0440   CO2 28 02/03/2015 0440   GLUCOSE 114* 02/03/2015 0440   BUN 39* 02/03/2015 0440   CREATININE 3.87* 02/03/2015 0440   CREATININE 0.68 05/27/2014 0923   CALCIUM 8.2* 02/03/2015 0440   GFRNONAA 11* 02/03/2015 0440   GFRAA 13* 02/03/2015 0440   CBC    Component Value Date/Time   WBC 9.9 02/03/2015 0456   RBC 2.83* 02/03/2015 0456   HGB 9.4* 02/03/2015 0456   HCT 27.0* 02/03/2015 0456   PLT 257 02/03/2015 0456   MCV 95.4 02/03/2015 0456   MCH 33.2 02/03/2015 0456   MCHC 34.8 02/03/2015 0456   RDW 13.8 02/03/2015 0456   LYMPHSABS 1.8 02/02/2015 0230   MONOABS 0.5 02/02/2015 0230   EOSABS 0.1 02/02/2015 0230   BASOSABS 0.0 02/02/2015 0230    Assessment/Plan:  1. AKI/CKD in setting of sepsis. First HD on 01/28/15 due to pulmonary edema and SOB. She had a second session of HD on 01/29/15 but none since then 1. Marked increase in  UOP. Likely in diuretic phase of recovery. 2. Hold off on further HD for now and follow closely.  3. Decreased dose of lasix yesterday and now with improvement in Scr. 4. Continue to lower dose and follow 2. Group A strep sepsis- ID following, on IV rocephin 3. Hypokalemia- replete and follow. 4. A fib with RVR- on dilt drip, Cardiology following. 5. Hyponatremia- follow with diuresis, will decrease lasix dose and frequency. 6. Oral ulcers- per primary svc. 7. Metabolic acidosis- resolved. 8. SIRS- improving 9. Anemia- follow and transfuse prn 10. DM- per primary svc   Amanda Wilkins A

## 2015-02-03 NOTE — Progress Notes (Signed)
Occupational Therapy Treatment Patient Details Name: Anayia Eugene MRN: [id number] DOB: October 03, 1946 Today's Date: 02/03/2015    History of present illness Adm 01/22/15 with n/v, general malaise, diarrhea, fever, and Rt flank pain (which turned into Group A Strep infection); sepsis with hypotension, afib with RVR; 4/15 fluid overload with plans for HD cath/HD  PMHx-afib, OA, obesity   OT comments  This 69 yo female making progress, will continue to benefit from OT acutely and at SNF to get to a Mod I to Independent level.  Follow Up Recommendations  SNF    Equipment Recommendations   (TBD at next venue)       Precautions / Restrictions Precautions Precautions: Fall Precaution Comments: contact Restrictions Weight Bearing Restrictions: No       Mobility Bed Mobility Overal bed mobility: Modified Independent Bed Mobility: Supine to Sit;Sit to Supine     Supine to sit: Modified independent (Device/Increase time);HOB elevated Sit to supine: Modified independent (Device/Increase time) (HOB flat)      Transfers Overall transfer level: Needs assistance Equipment used: Rolling walker (2 wheeled) Transfers: Sit to/from Stand Sit to Stand: Supervision                  ADL Overall ADL's : Needs assistance/impaired     Grooming: Oral care;Supervision/safety;Standing               Lower Body Dressing: Supervision/safety;Sit to/from stand   Toilet Transfer: Min guard;Ambulation;RW (bed>out and down hallway>back to room (53feet))                              Cognition   Behavior During Therapy: [facility] for tasks assessed/performed Overall Cognitive Status: Within Functional Limits for tasks assessed                                    Pertinent Vitals/ Pain       Pain Assessment: No/denies pain         Frequency Min 2X/week     Progress Toward Goals  OT Goals(current goals can now be found in the care plan section)  Progress  towards OT goals: Progressing toward goals     Plan Discharge plan remains appropriate       End of Session Equipment Utilized During Treatment: Rolling walker   Activity Tolerance Patient tolerated treatment well   Patient Left in bed;with call bell/phone within reach   Nurse Communication          Time: 7078-6754 OT Time Calculation (min): 32 min  Charges: OT General Charges $OT Visit: 1 Procedure OT Treatments $Self Care/Home Management : 23-37 mins  Almon Register [phone] 02/03/2015, 3:52 PM

## 2015-02-03 NOTE — Clinical Social Work Note (Signed)
CSW met with patient and provided with bed offers- pt will speak with family and get them to visit local facilities.  CSW will continue to follow.  Domenica Reamer, [place] Social Worker [phone]

## 2015-02-03 NOTE — Progress Notes (Signed)
Patient Name: Amanda Wilkins Date of Encounter: 02/03/2015   SUBJECTIVE  Pt denies CP or SOB. Breathing improved.   CURRENT MEDS . acyclovir ointment  1 application Topical C3J  . aspirin  325 mg Oral Daily  . benzonatate  200 mg Oral TID  . bisoprolol  5 mg Oral BID  . cefTRIAXone (ROCEPHIN)  IV  2 g Intravenous Q24H  . chlorpheniramine-HYDROcodone  5 mL Oral Q12H  . diltiazem  60 mg Oral 4 times per day  . estrogens (conjugated)  0.3 mg Oral Q48H  . famotidine  20 mg Oral QHS  . furosemide  80 mg Intravenous Daily  . heparin  5,000 Units Subcutaneous 3 times per day  . potassium chloride  10 mEq Intravenous Q1 Hr x 3  . potassium chloride  20 mEq Oral Daily  . potassium chloride  40 mEq Oral Once  . sertraline  50 mg Oral Daily  . valACYclovir  1,000 mg Oral Daily    OBJECTIVE  Filed Vitals:   02/02/15 1918 02/03/15 0413 02/03/15 0500 02/03/15 0552  BP: 125/56  96/51 132/70  Pulse: 97 84 107 108  Temp: 98.7 F (37.1 C) 98 F (36.7 C)    TempSrc:  Oral    Resp: 18 18    Height: 5\' 6"  (1.676 m)     Weight: 231 lb 7.7 oz (105 kg) 231 lb 7.7 oz (105 kg)    SpO2: 97% 96%      Intake/Output Summary (Last 24 hours) at 02/03/15 1135 Last data filed at 02/03/15 0700  Gross per 24 hour  Intake    150 ml  Output   1950 ml  Net  -1800 ml   I/O -9.3L since admission.  Filed Weights   02/02/15 0500 02/02/15 1918 02/03/15 0413  Weight: 227 lb 11.8 oz (103.3 kg) 231 lb 7.7 oz (105 kg) 231 lb 7.7 oz (105 kg)    PHYSICAL EXAM  General: Pleasant, NAD. Neuro: Alert and oriented X 3. Moves all extremities spontaneously. Psych: Normal affect. HEENT:  Normal  Neck: Supple without bruits or JVD. Lungs:  Resp regular and unlabored, CTA. Heart: irregularly irregular rhythm, rate 115 better, 1/6 sem Abdomen: Obese, soft, non-tender, non-distended, BS + x 4.  Extremities: No clubbing, cyanosis. 1+ edema. DP/PT/Radials 2+ and equal bilaterally.  Accessory Clinical  Findings  CBC  Recent Labs  02/02/15 0230 02/03/15 0456  WBC 11.3* 9.9  NEUTROABS 8.8*  --   HGB 9.3* 9.4*  HCT 26.6* 27.0*  MCV 96.0 95.4  PLT 211 628   Basic Metabolic Panel  Recent Labs  02/01/15 0422 02/02/15 0230 02/03/15 0440  NA 129* 130* 132*  K 3.1* 3.0* 2.9*  CL 90* 91* 93*  CO2 26 27 28   GLUCOSE 105* 110* 114*  BUN 36* 37* 39*  CREATININE 3.81* 3.93* 3.87*  CALCIUM 8.3* 8.1* 8.2*  MG  --  1.6  --   PHOS 6.7*  --  5.9*   Liver Function Tests  Recent Labs  02/02/15 0230 02/03/15 0440  AST 23  --   ALT 23  --   ALKPHOS 57  --   BILITOT 0.5  --   PROT 5.2*  --   ALBUMIN 2.1* 2.3*    TELE  A.Fib with rates in 95-120s.  Radiology/Studies  Echocardiogram 01/25/2015: Study Conclusions  - Left ventricle: The cavity size was normal. Wall thickness was normal. Systolic function was normal. The estimated ejection fraction was in the range of  55% to 60%. Wall motion was normal; there were no regional wall motion abnormalities. - Mitral valve: Calcified annulus. There was mild regurgitation. - Left atrium: The atrium was mildly dilated. - Pulmonary arteries: Systolic pressure was mildly increased. PA peak pressure: 32 mm Hg (S).  Impressions: - Normal LV function; mild LAE; mild MR and TR; mildly elevated pulmonary pressure.  ASSESSMENT AND PLAN Principal Problem:   Atrial fibrillation with RVR Active Problems:   Palpitations   Upper respiratory disease   Dehydration   Breast pain, right   A-fib   Fever   Nausea & vomiting   Sepsis   Acute renal insufficiency   Abdominal pain   Axillary pain   Dyspnea   Hypoxia   Thrombocytopenia   Transaminitis   Hyperbilirubinemia   FUO (fever of unknown origin)   Septic shock   AKI (acute kidney injury)   Cholecystitis   DIC (disseminated intravascular coagulation)   Group A streptococcal infection   Cellulitis   D-dimer, elevated   Severe sepsis with septic shock   Acute  respiratory failure   HSV-1 (herpes simplex virus 1) infection   Gallstones   Shoulder pain   Septic shock due to streptococcal infection   Atelectasis   Acute pulmonary edema   Persistent atrial fibrillation   Acute kidney injury   Hyponatremia   Hypokalemia   Hyperglycemia   Elevated d-dimer   OSA (obstructive sleep apnea)    1. Persistent AF: Ventricular rate improved now on cardizem 60 mg every 6 hrs and Bisoprolol 5 mg bid. CHADS2VASC 3. Consider initiating anticoagulation if risk of bleeding is reduced from comorbidities.  2. AKI: Cr 3.87; now with significant diuresis probably in recovery phase; No further dialysis followed by renal felt mediated by AF, hypotension, bacteremia, Cox-2 inhib. Now requiring less lasix 80 mg bid; now with inc UOP; last HD on 4/16. K down further to 2.9. Nephrology following.    3. Septic shock Group A strep 4. Anemia 5. Hypokalemia: 2.9 today 6. Possible inferior/inferolateral ischemia on prior myoview 12/2014 -  No chest pain, negative cardiac enzymes. Would defer further eval presently with renal and additional significant comorbidities. 7. Erythematous rash, less intense. with blistering 8. DM  Signed, Bhagat,Bhavinkumar PA-C  Pager [phone]  Personally seen and examined. Agree with above. Discussed with family Rate controlling afib Would like to anticoagulate but will defer timing to primary team (risk vs benefits from anemia standpoint)  Keep on dilt 60 q 6 today and consider consolidating tomorrow.  If unable to tolerate from BP aspect, may need amiodarone   Candee Furbish, MD

## 2015-02-03 NOTE — Progress Notes (Signed)
[facility]                                                                              Patient Demographics  Amanda Wilkins, is a 69 y.o. female, DOB - 11-09-1945, VHQ:[id number]  Admit date - 01/22/2015   Admitting Physician Nelda Bucks, MD  Outpatient Primary MD for the patient is Sonda Primes, MD  LOS - 11   Chief Complaint  Patient presents with  . Irregular Heart Beat       Brief HPI   Patient is a 69 year old female with paroxysmal atrial fibrillation, hypertension, obesity presented with 2 day history of nausea, vomiting, generalized malaise, diarrhea, palpitations and fever. She had vomited several times and was unable to hold down her home medications. Lactic acid was 3.6 at the time of admission. CT chest showed inflammation in the right axilla extending inferiorly along the right chest but no focal evidence of abscess or gas. Patient was admitted to stepdown unit for further workup. BMET at the time of admission showed sodium of 127, creatinine 1.97, bicarbonate 16.   Assessment & Plan   Principal problem Septic shock due to Group A strep cellulitis, erysipelas / chest wall induration with Streptococcus bacteremia  - Improved The patient was admitted with acute viral gastroenteritis and lactic acidosis. CT chest showed inflammation in the right axilla extending inferiorly along the right chest, no focal evidence of abscess or gas. CT abdomen and pelvis showed no renal obstruction, gallbladder distention with possible gallstones. Renal ultrasound showed no hydronephrosis, cholelithiasis however small left pleural effusion. Patient however became hypotensive, tachycardiac with respiratory distress and PCCM was consulted on 4/11. Patient required critical care support for vasopressors and was off the pressors on 4/13. HIDA scan showed a delayed filling of gallbladder, no evidence of biliary obstruction MRI of the flank showed no focal pockets  of infection/abscess. ID, Dr. Luciana Axe signed off on 4/18, recommended to continue IV ceftriaxone through 4/24. Patient had initially complained of some shoulder pain which has now resolved and per ID, no indication for MRI.  Active problems ?Viral Gastroenteritis - doubt  - Now resolved, likely her initial GI symptoms were due to sepsis/severe cellulitis, bacteremia  Acute Kidney Injury  - Likely due to sepsis/septic shock, atrial fibrillation, hypotension, NSAIDs. - Nephrology following closely, still has hemodialysis catheter   Persistent dependent Atelectasis  - Incentive spirometry, mobilization  Acute pulm edema  - medical management somewhat difficult due to acute kidney injury, nephrology following, per nephrology likely in the diuretic phase of recovery  Follow daily BMET   Persistent Atrial Fibrillation  - Cardiology following, rate controlled, off of IV Cardizem drip  - CHADS2VASC 3 - cardiology recommending anticoagulation, not a candidate for NOAC due to renal failure, coumadin? Defer to cardiology  Hypokalemia - Placed on IV and oral replacement  HSV1 exacerbation  Appears to be resolving - acyclovir initiated 4/19   Hyponatremia  -Likely due to renal insufficiency, improving   Thrombocytopenia  Likely due to consumptive colopathy secondary to sepsis, resolved    Abnormal Myoview  Obtained in March 2016 -reviewed cardiology recommendations, currently no chest pain, cardiac enzymes  negative. Defer further evaluation due to renal and additional significant comorbidities.   Obesity - Body mass index is 36.77 kg/(m^2).  -  patient counseled on diet and weight control   High D-dimer  - doubt PE, CT angiogram not possible due to renal function,  Doppler lower extremity negative for DVT  - Cardiology starting on anticoagulation  Suspected OSA  Saturations remaining stable - will need outpatient sleep study   Code Status:Full code  Family Communication: Discussed  in detail with the patient, all imaging results, lab results explained to the patient    Disposition Plan: not medically ready  Time Spent in minutes   28 minutes  Procedures  CT Chest > inflammation in R axilla extending inferiorly along R chest, no focal evidence of abscess or gas, mild dependent atx CT Abd/Pelvis > no renal obs, Gallbladder distention with possible internal gallstone identified Renal US > no hydronephrosis, cholelithiasis, small L pleural effusion  LE Duplex > neg for DVT, RUE Duplex > neg for DVT  HIDA Scan > delayed filling of gallbladder, but the gallbladder fills after morphine administration, no evidence of biliary obstruction   Consults   Cardiology  ID Nephrology   DVT Prophylaxis heparin   Medications  Scheduled Meds: . acyclovir ointment  1 application Topical Q3H  . aspirin  325 mg Oral Daily  . benzonatate  200 mg Oral TID  . bisoprolol  5 mg Oral BID  . cefTRIAXone (ROCEPHIN)  IV  2 g Intravenous Q24H  . chlorpheniramine-HYDROcodone  5 mL Oral Q12H  . diltiazem  60 mg Oral 4 times per day  . estrogens (conjugated)  0.3 mg Oral Q48H  . famotidine  20 mg Oral QHS  . furosemide  80 mg Intravenous Daily  . heparin  5,000 Units Subcutaneous 3 times per day  . potassium chloride  40 mEq Oral Once  . sertraline  50 mg Oral Daily  . valACYclovir  1,000 mg Oral Daily   Continuous Infusions:  PRN Meds:.acetaminophen **OR** acetaminophen, guaifenesin, levalbuterol, loperamide, magic mouthwash w/lidocaine, menthol-cetylpyridinium, ondansetron **OR** ondansetron (ZOFRAN) IV, sodium chloride, traMADol   Antibiotics   Anti-infectives    Start     Dose/Rate Route Frequency Ordered Stop   02/01/15 1200  valACYclovir (VALTREX) tablet 1,000 mg     1,000 mg Oral Daily 02/01/15 1120     01/26/15 1530  cefTRIAXone (ROCEPHIN) 2 g in dextrose 5 % 50 mL IVPB - Premix     2 g 100 mL/hr over 30 Minutes Intravenous Every 24 hours 01/26/15 1505 02/06/15 2359     01/25/15 1200  vancomycin (VANCOCIN) 1,500 mg in sodium chloride 0.9 % 500 mL IVPB  Status:  Discontinued     1,500 mg 250 mL/hr over 120 Minutes Intravenous Every 48 hours 01/23/15 1417 01/25/15 1015   01/25/15 1100  clindamycin (CLEOCIN) IVPB 900 mg  Status:  Discontinued     900 mg 100 mL/hr over 30 Minutes Intravenous 3 times per day 01/25/15 1014 01/30/15 1310   01/24/15 1600  doxycycline (VIBRAMYCIN) 100 mg in dextrose 5 % 250 mL IVPB  Status:  Discontinued     100 mg 125 mL/hr over 120 Minutes Intravenous Every 12 hours 01/24/15 1500 01/25/15 1013   01/24/15 1400  piperacillin-tazobactam (ZOSYN) IVPB 2.25 g  Status:  Discontinued     2.25 g 100 mL/hr over 30 Minutes Intravenous 4 times per day 01/24/15 1148 01/26/15 1505   01/24/15 1000  vancomycin (VANCOCIN) IVPB 1000 mg/200 mL  premix  Status:  Discontinued     1,000 mg 200 mL/hr over 60 Minutes Intravenous Every 24 hours 01/23/15 0846 01/23/15 1417   01/23/15 2200  piperacillin-tazobactam (ZOSYN) IVPB 3.375 g  Status:  Discontinued     3.375 g 12.5 mL/hr over 240 Minutes Intravenous Every 8 hours 01/23/15 0846 01/24/15 1148   01/23/15 1000  oseltamivir (TAMIFLU) capsule 30 mg  Status:  Discontinued     30 mg Oral 2 times daily 01/23/15 0803 01/23/15 1413   01/23/15 0900  vancomycin (VANCOCIN) 2,000 mg in sodium chloride 0.9 % 500 mL IVPB     2,000 mg 250 mL/hr over 120 Minutes Intravenous  Once 01/23/15 0846 01/23/15 1401   01/23/15 0845  piperacillin-tazobactam (ZOSYN) IVPB 3.375 g     3.375 g 100 mL/hr over 30 Minutes Intravenous  Once 01/23/15 0846 01/23/15 2013        Subjective:   Amanda Wilkins was seen and examined today.  Patient denies dizziness, chest pain, abdominal pain, N/V/D/C, new weakness, numbess, tingling. No acute events overnight. Still weeping wounds from the right chest wall abdomen. Tolerating solid diet without any difficulty.   Objective:   Blood pressure 132/70, pulse 108, temperature 98  F (36.7 C), temperature source Oral, resp. rate 18, height 5\' 6"  (1.676 m), weight 105 kg (231 lb 7.7 oz), SpO2 96 %.  Wt Readings from Last 3 Encounters:  02/03/15 105 kg (231 lb 7.7 oz)  01/04/15 103.874 kg (229 lb)  12/16/14 102.513 kg (226 lb)     Intake/Output Summary (Last 24 hours) at 02/03/15 1309 Last data filed at 02/03/15 0700  Gross per 24 hour  Intake    150 ml  Output   1950 ml  Net  -1800 ml    Exam  General: Alert and oriented x 3, NAD  HEENT:  PERRLA, EOMI, Anicteic Sclera, mucous membranes moist.   Neck: Supple, no JVD, no masses  CVS: S1 S2 auscultated, no rubs, murmurs or gallops. Regular rate and rhythm.  Respiratory: decreased breath sounds at the bases, no rhonchi   Abdomen:  morbidly obese, Soft, nontender, nondistended, + bowel sounds  Ext: no cyanosis clubbing, 1+ edema  Neuro: AAOx3, Cr N's II- XII. Strength 5/5 upper and lower extremities bilaterally  Skin: No rashes  Psych: Normal affect and demeanor, alert and oriented x3    Data Review   Micro Results Recent Results (from the past 240 hour(s))  Culture, Urine     Status: None   Collection Time: 01/24/15  2:02 PM  Result Value Ref Range Status   Specimen Description URINE, CATHETERIZED  Final   Special Requests NONE  Final   Colony Count NO GROWTH Performed at [facility]   Final   Culture NO GROWTH Performed at [facility]   Final   Report Status 01/26/2015 FINAL  Final  MRSA PCR Screening     Status: None   Collection Time: 01/24/15  4:30 PM  Result Value Ref Range Status   MRSA by PCR NEGATIVE NEGATIVE Final    Comment:        The GeneXpert MRSA Assay (FDA approved for NASAL specimens only), is one component of a comprehensive MRSA colonization surveillance program. It is not intended to diagnose MRSA infection nor to guide or monitor treatment for MRSA infections.   Clostridium Difficile by PCR     Status: None   Collection Time: 02/01/15   4:59 PM  Result Value Ref Range Status  C difficile by pcr NEGATIVE NEGATIVE Final    Radiology Reports Ct Abdomen Pelvis Wo Contrast  01/24/2015   CLINICAL DATA:  Abdominal pain, nausea, vomiting, diarrhea. Fever and chills. Sepsis.  EXAM: CT ABDOMEN AND PELVIS WITHOUT CONTRAST  TECHNIQUE: Multidetector CT imaging of the abdomen and pelvis was performed following the standard protocol without IV contrast.  COMPARISON:  No similar prior exam is available at this institution for comparison or on [facility].  FINDINGS: Lower chest: Small bilateral pleural effusions are noted. Interlobular septal thickening and bronchial wall thickening are suggestive of interstitial pulmonary edema.  Hepatobiliary: Hepatic hypodensity suggests steatosis. No focal hepatic abnormality or intrahepatic ductal dilatation. Gallbladder distention with probable dependent stone image 34.  Pancreas: Normal  Spleen: Normal  Adrenals/Urinary Tract: Adrenal glands appear normal. Moderate nonspecific bilateral perinephric stranding is identified. No hydroureteronephrosis. A Foley catheter is in place and the bladder is decompressed. No radiopaque renal or ureteral calculus. Renal parenchymal detail is obscured by lack of contrast.  Stomach/Bowel: Stomach appears normal. Normal appendix. No bowel wall thickening or focal segmental dilatation is identified.  Vascular/Lymphatic: No lymphadenopathy. Mild atheromatous aortic calcification identified.  Reproductive: Uterus and ovaries are unremarkable.  Other: Mild soft tissue anasarca is present which may suggest fluid overload. No free air or free intra-abdominal fluid.  Musculoskeletal: No acute osseous abnormality. L5-S1 disc degenerative change.  IMPRESSION: Findings at the lung bases suggest interstitial pulmonary edema with mild soft tissue anasarca.  Nonspecific bilateral perinephric stranding without hydroureteronephrosis or radiopaque renal or ureteral calculus.  Gallbladder distention  with possible internal gallstone identified. This again raises the question of possible cholecystitis in the setting of sepsis.   Electronically Signed   By: Christiana Pellant M.D.   On: 01/24/2015 21:04   Dg Chest 2 View  01/22/2015   CLINICAL DATA:  Atrial fibrillation. Shortness of breath with exertion. Right axillary pain. No injury.  EXAM: CHEST  2 VIEW  COMPARISON:  09/22/2014  FINDINGS: Normal heart size and pulmonary vascularity. Perihilar interstitial changes suggesting chronic bronchitis. Linear atelectasis or scarring in the lung bases. No focal consolidation. No blunting of costophrenic angles. No pneumothorax. Mediastinal contours appear intact. Calcified aorta. Mild degenerative changes in the spine.  IMPRESSION: Chronic bronchitic changes in the lungs with slight fibrosis or linear atelectasis in the lung bases. No focal consolidation.   Electronically Signed   By: Burman Nieves M.D.   On: 01/22/2015 23:46   Ct Chest Wo Contrast  01/23/2015   CLINICAL DATA:  69 year old female with right-sided chest and axillary pain. Sepsis. Fever for 1-2 weeks.  EXAM: CT CHEST WITHOUT CONTRAST  TECHNIQUE: Multidetector CT imaging of the chest was performed following the standard protocol without IV contrast.  COMPARISON:  01/22/2015 chest radiograph. Chest CT report dated 09/22/2014. Images are not available at this time due to PACS downtime.  FINDINGS: Mediastinum/Nodes: The heart and great vessels are unremarkable. There is no evidence of thoracic aortic aneurysm. No pleural or pericardial effusions are identified. No enlarged lymph nodes are noted.  Lungs/Pleura: Mild dependent and basilar atelectasis noted. There is no evidence of airspace disease, consolidation, suspicious nodule, mass or endobronchial/endotracheal lesion.  Upper abdomen: Hepatic steatosis identified.  Musculoskeletal: Right axillary inflammation is identified extending inferiorly along the right chest. There is no evidence of gas or  abscess. No acute or suspicious bony abnormalities are identified.  IMPRESSION: Inflammation in the right axilla extending inferiorly along the right chest -suspicious for infection given clinical history. No evidence of focal  abscess or gas.  Mild dependent/basilar atelectasis.   Electronically Signed   By: Harmon Pier M.D.   On: 01/23/2015 11:12   Mr Abdomen Wo Contrast  01/29/2015   CLINICAL DATA:  Septic shock. Evaluate subcutaneous soft tissue swelling/ edema involving the abdominal wall.  EXAM: MRI ABDOMEN WITHOUT CONTRAST  TECHNIQUE: Multiplanar multisequence MR imaging was performed without the administration of intravenous contrast.  COMPARISON:  CT scan 01/24/2015  FINDINGS: There is diffuse edema like signal abnormality in the subcutaneous fat involving the entire abdominal wall more significantly on the right side. I do not see a discrete drainable fluid collection to suggest an abscess. No definite involvement of the abdominal muscles. There is however involvement of the latissimus dorsi muscle on the right side.  Intra-abdominal findings include bowel dilatation and cholelithiasis. No large intra-abdominal fluid collections are identified.  IMPRESSION: Diffuse subcutaneous soft tissue swelling/ edema/fluid likely reflecting cellulitis. There is also myositis involving the right latissimus dorsi muscle. No focal drainable fluid collection/abscess is identified.  Bowel dilatation is noted. No significant intra abdominal fluid collection is identified.  Gallbladder distention and cholelithiasis.   Electronically Signed   By: Rudie Meyer M.D.   On: 01/29/2015 12:56   Nm Hepatobiliary Including Gb  01/26/2015   CLINICAL DATA:  Gallstones.  Distended gallbladder on prior CT.  EXAM: NUCLEAR MEDICINE HEPATOBILIARY IMAGING  TECHNIQUE: Sequential images of the abdomen were obtained out to 60 minutes following intravenous administration of radiopharmaceutical.  RADIOPHARMACEUTICALS:  5.0 Millicurie Tc-66m  Choletec  COMPARISON:  CT 01/24/2015  FINDINGS: Prompt uptake and excretion of radiotracer by the liver. Activity is seen in small bowel by 30 minutes. No activity noted in the gallbladder at 90 minutes. Therefore, 4 mg of morphine was administered intravenously. Continued imaging demonstrates filling of the gallbladder post morphine.  IMPRESSION: Delayed filling of the gallbladder, but the gallbladder fills after morphine administration compatible with patent cystic duct. No evidence of biliary obstruction.   Electronically Signed   By: Charlett Nose M.D.   On: 01/26/2015 14:28   [place] Chest  01/25/2015   CLINICAL DATA:  69 year old female with right axillary and chest wall pain. Fever and sepsis. Initial encounter.  EXAM: CHEST ULTRASOUND  COMPARISON:  Chest CT 01/23/2015.  FINDINGS: Ultrasound imaging in the area of clinical concern corresponding to the right axilla and chest wall.  Lymph nodes with normal fatty hila. Diffuse subcutaneous edema. Evidence of edematous chest wall muscle (image 19) with separation of muscle fibers, but no discrete or drainable fluid collection.  IMPRESSION: Subcutaneous and chest wall muscle edema with no discrete or drainable fluid collection. Regional lymph nodes remain normal.   Electronically Signed   By: Odessa Fleming M.D.   On: 01/25/2015 12:11   US Renal  01/24/2015   CLINICAL DATA:  Acute renal insufficiency.  EXAM: RENAL/URINARY TRACT ULTRASOUND COMPLETE  COMPARISON:  11/16/2009  FINDINGS: Right Kidney:  Length: 10.1 cm. Echogenicity within normal limits. No mass or hydronephrosis visualized.  Left Kidney:  Length: 10.4 cm. No hydronephrosis. Mild perinephric edema or trace fluid adjacent the lower pole left kidney.  Bladder:  Collapsed around a Foley catheter.  Exam degraded by patient body habitus. Incidental note is made of a 2.6 cm gallstone and a left-sided pleural effusion.  IMPRESSION: 1.  No hydronephrosis.  No explanation for renal insufficiency. 2. Decreased  sensitivity and specificity exam due to technique related factors, as described above. 3. Cholelithiasis. 4. Small left pleural effusion.   Electronically Signed  By: Jeronimo Greaves M.D.   On: 01/24/2015 10:56   Dg Chest Port 1 View  01/28/2015   CLINICAL DATA:  Central line placement.  EXAM: PORTABLE CHEST - 1 VIEW  COMPARISON:  01/28/2015.  FINDINGS: The right IJ central venous catheter tip is in the proximal SVC above the level of the carina. No complicating features are demonstrated. Persistent bibasilar atelectasis. Possible small right effusion.  IMPRESSION: Right IJ central venous catheter tip is in the proximal SVC. No complicating features.  Persistent bibasilar atelectasis and possible small right effusion.   Electronically Signed   By: Rudie Meyer M.D.   On: 01/28/2015 15:25   Dg Chest Port 1 View  01/28/2015   CLINICAL DATA:  Acute respiratory failure.  EXAM: PORTABLE CHEST - 1 VIEW  COMPARISON:  January 27, 2015.  FINDINGS: Stable cardiomediastinal silhouette. No pneumothorax is noted. Left lung is clear. New right basilar opacity is noted concerning for pneumonia or atelectasis, with possible associated pleural effusion. Left-sided PICC line is noted with distal tip overlying expected position of the SVC.  IMPRESSION: New right basilar opacity is noted concerning for pneumonia or atelectasis with associated pleural effusion. Followup radiographs are recommended.   Electronically Signed   By: Lupita Raider, M.D.   On: 01/28/2015 08:26   Dg Chest Port 1 View  01/27/2015   CLINICAL DATA:  69 year old female with sepsis, septic shock. Initial encounter.  EXAM: PORTABLE CHEST - 1 VIEW  COMPARISON:  01/24/2015 and earlier.  FINDINGS: Portable AP upright view at 0802 hours. Stable left PICC line. Mildly improved lung volumes and bibasilar ventilation. Bilateral increased interstitial markings have regressed and now appear at baseline compared to 2015 Ing stands. No pneumothorax. No pleural effusion  or consolidation. Stable cardiac size and mediastinal contours.  IMPRESSION: Regressed bilateral interstitial opacity, lung parenchyma now all appears at baseline compared to 2015 studies. No new cardiopulmonary abnormality.   Electronically Signed   By: Odessa Fleming M.D.   On: 01/27/2015 08:08   Dg Chest Port 1 View  01/24/2015   CLINICAL DATA:  Wheezing and shortness of breath.  EXAM: PORTABLE CHEST - 1 VIEW  COMPARISON:  01/24/2015  FINDINGS: Left-sided PICC line tip overlies the superior vena cava. Heart is mildly prominent. There Kerley B-lines consistent with pulmonary edema. No focal consolidation. No overt alveolar edema.  IMPRESSION: 1. Mild interstitial edema. 2. Left-sided PICC line tip to the superior vena cava.   Electronically Signed   By: Norva Pavlov M.D.   On: 01/24/2015 22:14   Dg Chest Port 1 View  01/24/2015   CLINICAL DATA:  Hypoxia, pt was admitted with an acute URI and right armpit infection and was found to be in a-fib with RVR, hypovolemic, hyponatremic with an acute renal failure.  EXAM: PORTABLE CHEST - 1 VIEW  COMPARISON:  Radiograph 01/23/2015  FINDINGS: Left-sided PICC line with tip in distal SVC. Stable cardiac silhouette. There is mild central venous congestion. No focal infiltrate. No pulmonary edema. Mild basilar atelectasis.  IMPRESSION: Mild basilar atelectasis and minimal central venous congestion. No change from prior.   Electronically Signed   By: Genevive Bi M.D.   On: 01/24/2015 09:44   Dg Chest Port 1 View  01/23/2015   CLINICAL DATA:  Shortness of breath.  EXAM: PORTABLE CHEST - 1 VIEW  COMPARISON:  Same day.  FINDINGS: The heart size and mediastinal contours are within normal limits. Both lungs are clear. No pneumothorax or pleural effusion is noted. Left-sided  PICC line is noted with tip in expected position of cavoatrial junction. The visualized skeletal structures are unremarkable.  IMPRESSION: No acute cardiopulmonary abnormality seen.   Electronically  Signed   By: Lupita Raider, M.D.   On: 01/23/2015 18:52   Dg Chest Port 1 View  01/23/2015   CLINICAL DATA:  PICC line placement  EXAM: PORTABLE CHEST - 1 VIEW  COMPARISON:  CT scan of the chest same day  FINDINGS: Cardiomediastinal silhouette is unremarkable. No acute infiltrate or pulmonary edema. There is a left arm PICC line with tip in right atrium. For distal SVC position the PICC line should be retracted about 1 cm. No pneumothorax.  IMPRESSION: Left arm PICC line with tip in right atrium. For distal SVC position PICC line should be retracted about 1 cm. No pneumothorax.   Electronically Signed   By: Natasha Mead M.D.   On: 01/23/2015 17:45   US Abdomen Limited Ruq  01/25/2015   CLINICAL DATA:  Upper abdominal pain  EXAM: US ABDOMEN LIMITED - RIGHT UPPER QUADRANT  COMPARISON:  None.  FINDINGS: Gallbladder:  There is a a focal gallstone measuring 1.9 cm in length which moves and shadows. Gallbladder wall is not thickened, and there is no pericholecystic fluid. No sonographic Murphy sign noted.  Common bile duct:  Diameter: 5 mm. No intrahepatic or extrahepatic biliary duct dilatation.  Liver:  No focal lesion identified. Liver echogenicity is diffusely increased.  There is a small right pleural effusion.  IMPRESSION: Cholelithiasis. Increased liver echogenicity, most likely due to hepatic steatosis. While no focal liver lesions are identified, it must be cautioned that the sensitivity of ultrasound for focal liver lesions is diminished in this circumstance. Small right pleural effusion noted.   Electronically Signed   By: Bretta Bang III M.D.   On: 01/25/2015 12:15    CBC  Recent Labs Lab 01/30/15 0030 01/31/15 0555 02/01/15 0422 02/02/15 0230 02/03/15 0456  WBC 10.6* 14.2* 14.4* 11.3* 9.9  HGB 9.5* 9.7* 9.4* 9.3* 9.4*  HCT 26.9* 28.2* 27.1* 26.6* 27.0*  PLT 162 203 207 211 257  MCV 95.7 97.9 97.8 96.0 95.4  MCH 33.8 33.7 33.9 33.6 33.2  MCHC 35.3 34.4 34.7 35.0 34.8  RDW 14.2  14.4 14.1 13.8 13.8  LYMPHSABS  --   --   --  1.8  --   MONOABS  --   --   --  0.5  --   EOSABS  --   --   --  0.1  --   BASOSABS  --   --   --  0.0  --     Chemistries   Recent Labs Lab 01/29/15 0713 01/30/15 0500 01/31/15 0555 02/01/15 0422 02/02/15 0230 02/03/15 0440  NA 130* 133* 131* 129* 130* 132*  K 3.6 3.8 3.9 3.1* 3.0* 2.9*  CL 90* 96 93* 90* 91* 93*  CO2 24 26 26 26 27 28   GLUCOSE 149* 113* 130* 105* 110* 114*  BUN 48* 24* 33* 36* 37* 39*  CREATININE 3.82* 2.68* 3.45* 3.81* 3.93* 3.87*  CALCIUM 7.6* 7.8* 8.3* 8.3* 8.1* 8.2*  MG  --   --   --   --  1.6  --   AST 35  --   --   --  23  --   ALT 41*  --   --   --  23  --   ALKPHOS 109  --   --   --  57  --  BILITOT 0.6  --   --   --  0.5  --    ------------------------------------------------------------------------------------------------------------------ estimated creatinine clearance is 17 mL/min (by C-G formula based on Cr of 3.87). ------------------------------------------------------------------------------------------------------------------ No results for input(s): HGBA1C in the last 72 hours. ------------------------------------------------------------------------------------------------------------------ No results for input(s): CHOL, HDL, LDLCALC, TRIG, CHOLHDL, LDLDIRECT in the last 72 hours. ------------------------------------------------------------------------------------------------------------------ No results for input(s): TSH, T4TOTAL, T3FREE, THYROIDAB in the last 72 hours.  Invalid input(s): FREET3 ------------------------------------------------------------------------------------------------------------------ No results for input(s): VITAMINB12, FOLATE, FERRITIN, TIBC, IRON, RETICCTPCT in the last 72 hours.  Coagulation profile No results for input(s): INR, PROTIME in the last 168 hours.  No results for input(s): DDIMER in the last 72 hours.  Cardiac Enzymes No results for input(s):  CKMB, TROPONINI, MYOGLOBIN in the last 168 hours.  Invalid input(s): CK ------------------------------------------------------------------------------------------------------------------ Invalid input(s): POCBNP  No results for input(s): GLUCAP in the last 72 hours.   Shiya Fogelman M.D. [facility] 02/03/2015, 1:09 PM  Pager: [phone]   Between 7am to 7pm - call Pager - [phone]  After 7pm go to [email or web address] - password [email or web address]  Call night coverage person covering after 7pm

## 2015-02-03 NOTE — Progress Notes (Signed)
Pt a/o, pt has wound to right flank, Gustine saw pt and applied dressing and stated to avoid taping dressing, pt stated she hasn't been eating much d/t pain in her mouth, encouraged her to have small snacks and soft foods, also addressed with MD, pt stable

## 2015-02-03 NOTE — Progress Notes (Signed)
Utilization review completed.  

## 2015-02-03 NOTE — Consult Note (Signed)
WOC wound consult note Reason for Consult: Pt familiar from previous consult performed on 4/14; skin was intact with cellulitis at that time and has evolved.Consult requested for right abd; previous cellulitis has evolved into partial thickness skin loss Wound type: cellulitis wound on right distal abd Measurement:23x12x0.2 cm Wound bed:red area with pale to yellow patches, surrounding skin intact and red Drainage (amount, consistency, odor) No odor or drianiage Dressing procedure/placement/frequency: petroleum guaze cover with ABD pads  Cellulitis wound right medial abd area 12x4x0.1 cm cover blister with petroleum gauze and cover with abd pads, wound bed red with white yellow patches, surrounding skin intact, no drainage or odor noted.  Cellulitis wound right proximal abd area 10x4x0.1 cm, wound bed red with white yellow patches, surrounding skin intact, no drainage or odor noted.only cover with abd pads. If starts to blister cover with petroleum gauze.  Please re-consult if further assistance is needed.  Thank-you,  Julien Girt MSN, [place], [place], [place], [place]

## 2015-02-04 LAB — BASIC METABOLIC PANEL
Anion gap: 12 (ref 5–15)
BUN: 39 mg/dL — ABNORMAL HIGH (ref 6–23)
CO2: 27 mmol/L (ref 19–32)
CREATININE: 3.77 mg/dL — AB (ref 0.50–1.10)
Calcium: 8.2 mg/dL — ABNORMAL LOW (ref 8.4–10.5)
Chloride: 94 mmol/L — ABNORMAL LOW (ref 96–112)
GFR calc Af Amer: 13 mL/min — ABNORMAL LOW (ref >90)
GFR calc non Af Amer: 11 mL/min — ABNORMAL LOW (ref >90)
Glucose, Bld: 109 mg/dL — ABNORMAL HIGH (ref 70–99)
Potassium: 3.3 mmol/L — ABNORMAL LOW (ref 3.5–5.1)
Sodium: 133 mmol/L — ABNORMAL LOW (ref 135–145)

## 2015-02-04 LAB — FOLATE: FOLATE: 8 ng/mL

## 2015-02-04 LAB — CBC
HEMATOCRIT: 26.2 % — AB (ref 36.0–46.0)
HEMOGLOBIN: 9.1 g/dL — AB (ref 12.0–15.0)
MCH: 33.7 pg (ref 26.0–34.0)
MCHC: 34.7 g/dL (ref 30.0–36.0)
MCV: 97 fL (ref 78.0–100.0)
Platelets: 264 10*3/uL (ref 150–400)
RBC: 2.7 MIL/uL — ABNORMAL LOW (ref 3.87–5.11)
RDW: 14 % (ref 11.5–15.5)
WBC: 8.4 10*3/uL (ref 4.0–10.5)

## 2015-02-04 LAB — IRON AND TIBC
Iron: 22 ug/dL — ABNORMAL LOW (ref 42–145)
SATURATION RATIOS: 9 % — AB (ref 20–55)
TIBC: 234 ug/dL — ABNORMAL LOW (ref 250–470)
UIBC: 212 ug/dL (ref 125–400)

## 2015-02-04 LAB — FERRITIN: Ferritin: 689 ng/mL — ABNORMAL HIGH (ref 10–291)

## 2015-02-04 LAB — RETICULOCYTES
RBC.: 2.66 MIL/uL — ABNORMAL LOW (ref 3.87–5.11)
Retic Count, Absolute: 53.2 10*3/uL (ref 19.0–186.0)
Retic Ct Pct: 2 % (ref 0.4–3.1)

## 2015-02-04 LAB — VITAMIN B12: Vitamin B-12: 1096 pg/mL — ABNORMAL HIGH (ref 211–911)

## 2015-02-04 MED ORDER — DILTIAZEM HCL ER COATED BEADS 240 MG PO CP24
240.0000 mg | ORAL_CAPSULE | Freq: Every day | ORAL | Status: DC
  Administered 2015-02-05 – 2015-02-06 (×2): 240 mg via ORAL
  Filled 2015-02-04 (×2): qty 1

## 2015-02-04 MED ORDER — WARFARIN - PHARMACIST DOSING INPATIENT: Freq: Every day | Status: DC

## 2015-02-04 MED ORDER — PATIENT'S GUIDE TO USING COUMADIN BOOK
Freq: Once | Status: AC
  Administered 2015-02-04: 14:00:00
  Filled 2015-02-04: qty 1

## 2015-02-04 MED ORDER — WARFARIN VIDEO
Freq: Once | Status: AC
  Administered 2015-02-04: 14:00:00

## 2015-02-04 MED ORDER — WARFARIN SODIUM 7.5 MG PO TABS
7.5000 mg | ORAL_TABLET | Freq: Once | ORAL | Status: AC
  Administered 2015-02-04: 7.5 mg via ORAL
  Filled 2015-02-04: qty 1

## 2015-02-04 MED ORDER — BISOPROLOL FUMARATE 5 MG PO TABS
7.5000 mg | ORAL_TABLET | Freq: Two times a day (BID) | ORAL | Status: DC
  Administered 2015-02-04 – 2015-02-05 (×2): 7.5 mg via ORAL
  Filled 2015-02-04 (×4): qty 1.5

## 2015-02-04 MED ORDER — POTASSIUM CHLORIDE CRYS ER 20 MEQ PO TBCR
40.0000 meq | EXTENDED_RELEASE_TABLET | Freq: Every day | ORAL | Status: DC
Start: 2015-02-04 — End: 2015-02-06
  Administered 2015-02-04 – 2015-02-05 (×2): 40 meq via ORAL
  Filled 2015-02-04 (×3): qty 2

## 2015-02-04 NOTE — Discharge Instructions (Addendum)
Stroke Prevention Some medical conditions and behaviors are associated with an increased chance of having a stroke. You may prevent a stroke by making healthy choices and managing medical conditions. HOW CAN I REDUCE MY RISK OF HAVING A STROKE?   Stay physically active. Get at least 30 minutes of activity on most or all days.  Do not smoke. It may also be helpful to avoid exposure to secondhand smoke.  Limit alcohol use. Moderate alcohol use is considered to be:  No more than 2 drinks per day for men.  No more than 1 drink per day for nonpregnant women.  Eat healthy foods. This involves:  Eating 5 or more servings of fruits and vegetables a day.  Making dietary changes that address high blood pressure (hypertension), high cholesterol, diabetes, or obesity.  Manage your cholesterol levels.  Making food choices that are high in fiber and low in saturated fat, trans fat, and cholesterol may control cholesterol levels.  Take any prescribed medicines to control cholesterol as directed by your health care provider.  Manage your diabetes.  Controlling your carbohydrate and sugar intake is recommended to manage diabetes.  Take any prescribed medicines to control diabetes as directed by your health care provider.  Control your hypertension.  Making food choices that are low in salt (sodium), saturated fat, trans fat, and cholesterol is recommended to manage hypertension.  Take any prescribed medicines to control hypertension as directed by your health care provider.  Maintain a healthy weight.  Reducing calorie intake and making food choices that are low in sodium, saturated fat, trans fat, and cholesterol are recommended to manage weight.  Stop drug abuse.  Avoid taking birth control pills.  Talk to your health care provider about the risks of taking birth control pills if you are over 7 years old, smoke, get migraines, or have ever had a blood clot.  Get evaluated for sleep  disorders (sleep apnea).  Talk to your health care provider about getting a sleep evaluation if you snore a lot or have excessive sleepiness.  Take medicines only as directed by your health care provider.  For some people, aspirin or blood thinners (anticoagulants) are helpful in reducing the risk of forming abnormal blood clots that can lead to stroke. If you have the irregular heart rhythm of atrial fibrillation, you should be on a blood thinner unless there is a good reason you cannot take them.  Understand all your medicine instructions.  Make sure that other conditions (such as anemia or atherosclerosis) are addressed. SEEK IMMEDIATE MEDICAL CARE IF:   You have sudden weakness or numbness of the face, arm, or leg, especially on one side of the body.  Your face or eyelid droops to one side.  You have sudden confusion.  You have trouble speaking (aphasia) or understanding.  You have sudden trouble seeing in one or both eyes.  You have sudden trouble walking.  You have dizziness.  You have a loss of balance or coordination.  You have a sudden, severe headache with no known cause.  You have new chest pain or an irregular heartbeat. Any of these symptoms may represent a serious problem that is an emergency. Do not wait to see if the symptoms will go away. Get medical help at once. Call your local emergency services (911 in [place] Do not drive yourself to the hospital. Document Released: 11/08/2004 Document Revised: 02/15/2014 Document Reviewed: 04/03/2013 [facility] Patient Information 2015 [place], [place]. This information is not intended to replace advice given  to you by your health care provider. Make sure you discuss any questions you have with your health care provider. Atrial Fibrillation Atrial fibrillation is a type of irregular heart rhythm (arrhythmia). During atrial fibrillation, the upper chambers of the heart (atria) quiver continuously in a chaotic pattern. This causes  an irregular and often rapid heart rate.  Atrial fibrillation is the result of the heart becoming overloaded with disorganized signals that tell it to beat. These signals are normally released one at a time by a part of the right atrium called the sinoatrial node. They then travel from the atria to the lower chambers of the heart (ventricles), causing the atria and ventricles to contract and pump blood as they pass. In atrial fibrillation, parts of the atria outside of the sinoatrial node also release these signals. This results in two problems. First, the atria receive so many signals that they do not have time to fully contract. Second, the ventricles, which can only receive one signal at a time, beat irregularly and out of rhythm with the atria.  There are three types of atrial fibrillation:   Paroxysmal. Paroxysmal atrial fibrillation starts suddenly and stops on its own within a week.  Persistent. Persistent atrial fibrillation lasts for more than a week. It may stop on its own or with treatment.  Permanent. Permanent atrial fibrillation does not go away. Episodes of atrial fibrillation may lead to permanent atrial fibrillation. Atrial fibrillation can prevent your heart from pumping blood normally. It increases your risk of stroke and can lead to heart failure.  CAUSES   Heart conditions, including a heart attack, heart failure, coronary artery disease, and heart valve conditions.   Inflammation of the sac that surrounds the heart (pericarditis).  Blockage of an artery in the lungs (pulmonary embolism).  Pneumonia or other infections.  Chronic lung disease.  Thyroid problems, especially if the thyroid is overactive (hyperthyroidism).  Caffeine, excessive alcohol use, and use of some illegal drugs.   Use of some medicines, including certain decongestants and diet pills.  Heart surgery.   Birth defects.  Sometimes, no cause can be found. When this happens, the atrial  fibrillation is called lone atrial fibrillation. The risk of complications from atrial fibrillation increases if you have lone atrial fibrillation and you are age 55 years or older. RISK FACTORS  Heart failure.  Coronary artery disease.  Diabetes mellitus.   High blood pressure (hypertension).   Obesity.   Other arrhythmias.   Increased age. SIGNS AND SYMPTOMS   A feeling that your heart is beating rapidly or irregularly.   A feeling of discomfort or pain in your chest.   Shortness of breath.   Sudden light-headedness or weakness.   Getting tired easily when exercising.   Urinating more often than normal (mainly when atrial fibrillation first begins).  In paroxysmal atrial fibrillation, symptoms may start and suddenly stop. DIAGNOSIS  Your health care provider may be able to detect atrial fibrillation when taking your pulse. Your health care provider may have you take a test called an ambulatory electrocardiogram (ECG). An ECG records your heartbeat patterns over a 24-hour period. You may also have other tests, such as:  Transthoracic echocardiogram (TTE). During echocardiography, sound waves are used to evaluate how blood flows through your heart.  Transesophageal echocardiogram (TEE).  Stress test. There is more than one type of stress test. If a stress test is needed, ask your health care provider about which type is best for you.  Chest X-ray exam.  Blood tests.  Computed tomography (CT). TREATMENT  Treatment may include:  Treating any underlying conditions. For example, if you have an overactive thyroid, treating the condition may correct atrial fibrillation.  Taking medicine. Medicines may be given to control a rapid heart rate or to prevent blood clots, heart failure, or a stroke.  Having a procedure to correct the rhythm of the heart:  Electrical cardioversion. During electrical cardioversion, a controlled, low-energy shock is delivered to the  heart through your skin. If you have chest pain, very low blood pressure, or sudden heart failure, this procedure may need to be done as an emergency.  Catheter ablation. During this procedure, heart tissues that send the signals that cause atrial fibrillation are destroyed.  Surgical ablation. During this surgery, thin lines of heart tissue that carry the abnormal signals are destroyed. This procedure can either be an open-heart surgery or a minimally invasive surgery. With the minimally invasive surgery, small cuts are made to access the heart instead of a large opening.  Pulmonary venous isolation. During this surgery, tissue around the veins that carry blood from the lungs (pulmonary veins) is destroyed. This tissue is thought to carry the abnormal signals. HOME CARE INSTRUCTIONS   Take medicines only as directed by your health care provider. Some medicines can make atrial fibrillation worse or recur.  If blood thinners were prescribed by your health care provider, take them exactly as directed. Too much blood-thinning medicine can cause bleeding. If you take too little, you will not have the needed protection against stroke and other problems.  Perform blood tests at home if directed by your health care provider. Perform blood tests exactly as directed.  Quit smoking if you smoke.  Do not drink alcohol.  Do not drink caffeinated beverages such as coffee, soda, and some teas. You may drink decaffeinated coffee, soda, or tea.   Maintain a healthy weight.Do not use diet pills unless your health care provider approves. They may make heart problems worse.   Follow diet instructions as directed by your health care provider.  Exercise regularly as directed by your health care provider.  Keep all follow-up visits as directed by your health care provider. This is important. PREVENTION  The following substances can cause atrial fibrillation to recur:   Caffeinated  beverages.  Alcohol.  Certain medicines, especially those used for breathing problems.  Certain herbs and herbal medicines, such as those containing ephedra or ginseng.  Illegal drugs, such as cocaine and amphetamines. Sometimes medicines are given to prevent atrial fibrillation from recurring. Proper treatment of any underlying condition is also important in helping prevent recurrence.  SEEK MEDICAL CARE IF:  You notice a change in the rate, rhythm, or strength of your heartbeat.  You suddenly begin urinating more frequently.  You tire more easily when exerting yourself or exercising. SEEK IMMEDIATE MEDICAL CARE IF:   You have chest pain, abdominal pain, sweating, or weakness.  You feel nauseous.  You have shortness of breath.  You suddenly have swollen feet and ankles.  You feel dizzy.  Your face or limbs feel numb or weak.  You have a change in your vision or speech. MAKE SURE YOU:   Understand these instructions.  Will watch your condition.  Will get help right away if you are not doing well or get worse. Document Released: 10/01/2005 Document Revised: 02/15/2014 Document Reviewed: 11/11/2012 [facility] Patient Information 2015 [place], [place]. This information is not intended to replace advice given to you by your health care  provider. Make sure you discuss any questions you have with your health care provider. Information on my medicine - Coumadin   (Warfarin)  This medication education was reviewed with me or my healthcare representative as part of my discharge preparation.  The pharmacist that spoke with me during my hospital stay was:  Eudelia Bunch, [place]  Why was Coumadin prescribed for you? Coumadin was prescribed for you because you have a blood clot or a medical condition that can cause an increased risk of forming blood clots. Blood clots can cause serious health problems by blocking the flow of blood to the heart, lung, or brain. Coumadin can prevent  harmful blood clots from forming. As a reminder your indication for Coumadin is:   Stroke Prevention Because Of Atrial Fibrillation  What test will check on my response to Coumadin? While on Coumadin (warfarin) you will need to have an INR test regularly to ensure that your dose is keeping you in the desired range. The INR (international normalized ratio) number is calculated from the result of the laboratory test called prothrombin time (PT).  If an INR APPOINTMENT HAS NOT ALREADY BEEN MADE FOR YOU please schedule an appointment to have this lab work done by your health care provider within 7 days. Your INR goal is usually a number between:  2 to 3 or your provider may give you a more narrow range like 2-2.5.  Ask your health care provider during an office visit what your goal INR is.  What  do you need to  know  About  COUMADIN? Take Coumadin (warfarin) exactly as prescribed by your healthcare provider about the same time each day.  DO NOT stop taking without talking to the doctor who prescribed the medication.  Stopping without other blood clot prevention medication to take the place of Coumadin may increase your risk of developing a new clot or stroke.  Get refills before you run out.  What do you do if you miss a dose? If you miss a dose, take it as soon as you remember on the same day then continue your regularly scheduled regimen the next day.  Do not take two doses of Coumadin at the same time.  Important Safety Information A possible side effect of Coumadin (Warfarin) is an increased risk of bleeding. You should call your healthcare provider right away if you experience any of the following: ? Bleeding from an injury or your nose that does not stop. ? Unusual colored urine (red or dark brown) or unusual colored stools (red or black). ? Unusual bruising for unknown reasons. ? A serious fall or if you hit your head (even if there is no bleeding).  Some foods or medicines interact with  Coumadin (warfarin) and might alter your response to warfarin. To help avoid this: ? Eat a balanced diet, maintaining a consistent amount of Vitamin K. ? Notify your provider about major diet changes you plan to make. ? Avoid alcohol or limit your intake to 1 drink for women and 2 drinks for men per day. (1 drink is 5 oz. wine, 12 oz. beer, or 1.5 oz. liquor.)  Make sure that ANY health care provider who prescribes medication for you knows that you are taking Coumadin (warfarin).  Also make sure the healthcare provider who is monitoring your Coumadin knows when you have started a new medication including herbals and non-prescription products.  Coumadin (Warfarin)  Major Drug Interactions  Increased Warfarin Effect Decreased Warfarin Effect  Alcohol (large quantities)  Antibiotics (esp. Septra/Bactrim, Flagyl, Cipro) Amiodarone (Cordarone) Aspirin (ASA) Cimetidine (Tagamet) Megestrol (Megace) NSAIDs (ibuprofen, naproxen, etc.) Piroxicam (Feldene) Propafenone (Rythmol SR) Propranolol (Inderal) Isoniazid (INH) Posaconazole (Noxafil) Barbiturates (Phenobarbital) Carbamazepine (Tegretol) Chlordiazepoxide (Librium) Cholestyramine (Questran) Griseofulvin Oral Contraceptives Rifampin Sucralfate (Carafate) Vitamin K   Coumadin (Warfarin) Major Herbal Interactions  Increased Warfarin Effect Decreased Warfarin Effect  Garlic Ginseng Ginkgo biloba Coenzyme Q10 Green tea St. Johns wort    Coumadin (Warfarin) FOOD Interactions  Eat a consistent number of servings per week of foods HIGH in Vitamin K (1 serving =  cup)  Collards (cooked, or boiled & drained) Kale (cooked, or boiled & drained) Mustard greens (cooked, or boiled & drained) Parsley *serving size only =  cup Spinach (cooked, or boiled & drained) Swiss chard (cooked, or boiled & drained) Turnip greens (cooked, or boiled & drained)  Eat a consistent number of servings per week of foods MEDIUM-HIGH in Vitamin K (1  serving = 1 cup)  Asparagus (cooked, or boiled & drained) Broccoli (cooked, boiled & drained, or raw & chopped) Brussel sprouts (cooked, or boiled & drained) *serving size only =  cup Lettuce, raw (green leaf, endive, romaine) Spinach, raw Turnip greens, raw & chopped   These websites have more information on Coumadin (warfarin):  FailFactory.se; VeganReport.com.au;

## 2015-02-04 NOTE — Progress Notes (Signed)
Physical Therapy Treatment Patient Details Name: Amanda Wilkins MRN: [id number] DOB: [birth year] Today's Date: 02/04/2015    History of Present Illness Adm 01/22/15 with n/v, general malaise, diarrhea, fever, and Rt flank pain (which turned into Group A Strep infection); sepsis with hypotension, afib with RVR; 4/15 fluid overload with plans for HD cath/HD  PMHx-afib, OA, obesity    PT Comments    Pt motivated. HR 105-132 at rest (afib). Initiated bed exercises with no change in HR. Progressed to ambulation in room (decr velocity) and then in hallway with no change in HR (varied 105-139). Educated pt on increasing time sitting upright/OOB, continue bed exercises, and walking with nursing.   Follow Up Recommendations  SNF;Supervision for mobility/OOB     Equipment Recommendations  Rolling walker with 5" wheels    Recommendations for Other Services       Precautions / Restrictions Precautions Precautions: Fall Precaution Comments: contact Restrictions Weight Bearing Restrictions: No    Mobility  Bed Mobility Overal bed mobility: Independent                Transfers Overall transfer level: Needs assistance Equipment used: Rolling walker (2 wheeled) Transfers: Sit to/from Stand Sit to Stand: Min guard         General transfer comment: cues for hand placement and safety  Ambulation/Gait Ambulation/Gait assistance: Min guard Ambulation Distance (Feet): 150 Feet Assistive device: Rolling walker (2 wheeled) Gait Pattern/deviations: Step-through pattern;Decreased stride length;Wide base of support   Gait velocity interpretation: Below normal speed for age/gender General Gait Details: slight unsteadiness with side-step/stagger x 3; pt able to correct balance with use of RW   Stairs            Wheelchair Mobility    Modified Rankin (Stroke Patients Only)       Balance     Sitting balance-Leahy Scale: Good     Standing balance support: Bilateral  upper extremity supported Standing balance-Leahy Scale: Poor                      Cognition Arousal/Alertness: Awake/alert Behavior During Therapy: WFL for tasks assessed/performed Overall Cognitive Status: Within Functional Limits for tasks assessed                      Exercises General Exercises - Lower Extremity Ankle Circles/Pumps: AROM;Both;20 reps;Supine Quad Sets: AROM;Both;10 reps Gluteal Sets: AROM;Both;10 reps Low Level/ICU Exercises Stabilized Bridging: AROM;Both;5 reps (vc for breathing; timing)    General Comments        Pertinent Vitals/Pain Pain Assessment: No/denies pain    Home Living                      Prior Function            PT Goals (current goals can now be found in the care plan section) Acute Rehab PT Goals PT Goal Formulation: With patient Time For Goal Achievement: 02/11/15 Potential to Achieve Goals: Good Progress towards PT goals: Progressing toward goals;Goals met and updated - see care plan    Frequency  Min 3X/week    PT Plan Current plan remains appropriate    Co-evaluation             End of Session Equipment Utilized During Treatment: Gait belt Activity Tolerance: Treatment limited secondary to medical complications (Comment) (afib continues with HR limiting velocity; endurance) Patient left: in chair;with call bell/phone within reach;with family/visitor present     Time:  1610-9604 PT Time Calculation (min) (ACUTE ONLY): 30 min  Charges:  $Gait Training: 8-22 mins $Therapeutic Exercise: 8-22 mins                    G Codes:      Amanda Wilkins 02/12/2015, 4:32 PM Pager [phone]

## 2015-02-04 NOTE — Progress Notes (Signed)
Patient Name: Amanda Wilkins Date of Encounter: 02/04/2015   SUBJECTIVE  Pt denies CP or SOB. Breathing improved. Concern about starting coumadin.   CURRENT MEDS . acyclovir ointment  1 application Topical O7S  . aspirin  325 mg Oral Daily  . benzonatate  200 mg Oral TID  . bisoprolol  5 mg Oral BID  . cefTRIAXone (ROCEPHIN)  IV  2 g Intravenous Q24H  . chlorpheniramine-HYDROcodone  5 mL Oral Q12H  . diltiazem  60 mg Oral 4 times per day  . estrogens (conjugated)  0.3 mg Oral Q48H  . famotidine  20 mg Oral QHS  . heparin  5,000 Units Subcutaneous 3 times per day  . potassium chloride  40 mEq Oral Daily  . sertraline  50 mg Oral Daily  . valACYclovir  1,000 mg Oral Daily    OBJECTIVE  Filed Vitals:   02/03/15 1642 02/03/15 2058 02/04/15 0531 02/04/15 0859  BP: 123/63 111/62 146/55 132/54  Pulse: 92 115 95   Temp: 98 F (36.7 C) 97.3 F (36.3 C) 97.9 F (36.6 C)   TempSrc: Oral Oral Oral   Resp: 18 18 18    Height:      Weight:   236 lb 15.9 oz (107.5 kg)   SpO2: 96% 96% 96%     Intake/Output Summary (Last 24 hours) at 02/04/15 1203 Last data filed at 02/04/15 0904  Gross per 24 hour  Intake    250 ml  Output   1100 ml  Net   -850 ml   I/O -10.3L since admission.  Filed Weights   02/02/15 1918 02/03/15 0413 02/04/15 0531  Weight: 231 lb 7.7 oz (105 kg) 231 lb 7.7 oz (105 kg) 236 lb 15.9 oz (107.5 kg)    PHYSICAL EXAM  General: Pleasant, NAD. Neuro: Alert and oriented X 3. Moves all extremities spontaneously. Psych: Normal affect. HEENT:  Normal  Neck: Supple without bruits or JVD. Lungs:  Resp regular and unlabored, CTA. Heart: irregularly irregular rhythm, 1/6 sem Abdomen: Obese, soft, non-tender, non-distended, BS + x 4.  Extremities: No clubbing, cyanosis. Trace edema. DP/PT/Radials 2+ and equal bilaterally.  Accessory Clinical Findings  CBC  Recent Labs  02/02/15 0230 02/03/15 0456 02/04/15 0440  WBC 11.3* 9.9 8.4  NEUTROABS 8.8*   --   --   HGB 9.3* 9.4* 9.1*  HCT 26.6* 27.0* 26.2*  MCV 96.0 95.4 97.0  PLT 211 257 962   Basic Metabolic Panel  Recent Labs  02/02/15 0230 02/03/15 0440 02/04/15 0440  NA 130* 132* 133*  K 3.0* 2.9* 3.3*  CL 91* 93* 94*  CO2 27 28 27   GLUCOSE 110* 114* 109*  BUN 37* 39* 39*  CREATININE 3.93* 3.87* 3.77*  CALCIUM 8.1* 8.2* 8.2*  MG 1.6  --   --   PHOS  --  5.9*  --    Liver Function Tests  Recent Labs  02/02/15 0230 02/03/15 0440  AST 23  --   ALT 23  --   ALKPHOS 57  --   BILITOT 0.5  --   PROT 5.2*  --   ALBUMIN 2.1* 2.3*    TELE  A.Fib with rates in 95-130s.  Radiology/Studies  Echocardiogram 01/25/2015: Study Conclusions  - Left ventricle: The cavity size was normal. Wall thickness was normal. Systolic function was normal. The estimated ejection fraction was in the range of 55% to 60%. Wall motion was normal; there were no regional wall motion abnormalities. - Mitral valve: Calcified annulus. There  was mild regurgitation. - Left atrium: The atrium was mildly dilated. - Pulmonary arteries: Systolic pressure was mildly increased. PA peak pressure: 32 mm Hg (S).  Impressions: - Normal LV function; mild LAE; mild MR and TR; mildly elevated pulmonary pressure.  ASSESSMENT AND PLAN Principal Problem:   Atrial fibrillation with RVR Active Problems:   Palpitations   Upper respiratory disease   Dehydration   Breast pain, right   A-fib   Fever   Nausea & vomiting   Sepsis   Acute renal insufficiency   Abdominal pain   Axillary pain   Dyspnea   Hypoxia   Thrombocytopenia   Transaminitis   Hyperbilirubinemia   FUO (fever of unknown origin)   Septic shock   AKI (acute kidney injury)   Cholecystitis   DIC (disseminated intravascular coagulation)   Group A streptococcal infection   Cellulitis   D-dimer, elevated   Severe sepsis with septic shock   Acute respiratory failure   HSV-1 (herpes simplex virus 1) infection    Gallstones   Shoulder pain   Septic shock due to streptococcal infection   Atelectasis   Acute pulmonary edema   Persistent atrial fibrillation   Acute kidney injury   Hyponatremia   Hypokalemia   Hyperglycemia   Elevated d-dimer   OSA (obstructive sleep apnea)    1. PAF: CHADS2VASC 3. Per IM not a candidate for NOAC due to renal failure, and  requested pharmacy consult for Coumadin. Pt is concern about starting Coumadin as she did not like to monitor INR. I reassure patient that she is not a candidate for NOAC due to renal failure and she needs to be on anticoagulate for stroke risk. Pt understand risk and willing to processed with coumadin now. Currenlty on cardizem 60 po mg every 6 hrs and Bisoprolol 5 mg bid. Her rates in 90s-115s range. Needs better rate management. MD to advice. Continue ASA 325mg . Home meds - Atenolol 50mg  qd and Dilt XR 180mg .  -Last seen by Dr. [name] 07/14/2014 - she was in sinus and advice to f/u in 1 yr.  2. AKI: Cr improved to 3.77 from 3.87. No further dialysis followed by renal felt mediated by AF, hypotension, bacteremia, Cox-2 inhib. last HD on 4/16. Stopped Lasix 02/03/15. K improved to 3.3. Might remove HD cath tomorrow. Nephrology following.  I/O -10.3L since admission.  3. Septic shock Group A strep 4. Anemia 5. Hypokalemia:  6. Possible inferior/inferolateral ischemia on prior myoview 12/2014 -  No chest pain, negative cardiac enzymes. Would defer further eval presently with renal and additional significant comorbidities. 7. Erythematous rash, less intense. with blistering 8. DM  Signed, Bhagat,Bhavinkumar PA-C Pager [phone]  Patient seen and examined. Agree with assessment and plan. Still coughing. AF rate better controlled. Will consolidate cardizem to 240 mg CD daily beginning tomorrow and slightly increase bisoprolol to 7.5 mg bid and possible 10 mg bid as tolerates. Coumadin to start. Will dc ASA. K replete to 4.   Troy Sine, MD,  [facility] 02/04/2015 1:54 PM

## 2015-02-04 NOTE — Progress Notes (Addendum)
ANTICOAGULATION CONSULT NOTE - Initial Consult  Pharmacy Consult for coumadin Indication: atrial fibrillation  Allergies  Allergen Reactions  . Epinephrine Other (See Comments)    Heart rate soars  . Epinephrine Base Other (See Comments)    Seriously increases heart rate  . Aspirin Other (See Comments)    Can take the coated 325 mg. Plain asa 325 mg speeds up the heart.  . Penicillins Other (See Comments)    Pt previously has been told not to take because of family history of reactions (water blisters). She took amoxicillin in 2012-2013 with no reaction  . Tape Other (See Comments)    Blisters from adhesive tape    Patient Measurements: Height: 5\' 6"  (167.6 cm) Weight: 236 lb 15.9 oz (107.5 kg) IBW/kg (Calculated) : 59.3  Vital Signs: Temp: 97.9 F (36.6 C) (04/22 0531) Temp Source: Oral (04/22 0531) BP: 132/54 mmHg (04/22 0859) Pulse Rate: 95 (04/22 0531)  Labs:  Recent Labs  02/02/15 0230 02/03/15 0440 02/03/15 0456 02/04/15 0440  HGB 9.3*  --  9.4* 9.1*  HCT 26.6*  --  27.0* 26.2*  PLT 211  --  257 264  CREATININE 3.93* 3.87*  --  3.77*    Estimated Creatinine Clearance: 17.7 mL/min (by C-G formula based on Cr of 3.77).   Medical History: Past Medical History  Diagnosis Date  . Allergy   . Atrial fibrillation   . Migraines     on Zoloft for migraines  . Hypertension   . Chronic interstitial nephritis   . Depression   . Palpitations   . Obesity   . Arthritis     ON BOTH KNEES    Medications:  Prescriptions prior to admission  Medication Sig Dispense Refill Last Dose  . aspirin EC 325 MG tablet Take 325 mg by mouth at bedtime.    01/19/2015  . atenolol (TENORMIN) 50 MG tablet TAKE 1 TABLET BY MOUTH DAILY (Patient taking differently: TAKE 1 TABLET BY MOUTH DAILY AT BEDTIME) 90 tablet 3 01/19/2015 at 2230  . Cholecalciferol (VITAMIN D) 2000 UNITS tablet Take 2,000 Units by mouth daily.   01/19/2015  . DILT-XR 180 MG 24 hr capsule TAKE 1 CAPSULE BY MOUTH  DAILY 90 capsule 3 01/19/2015  . estrogens, conjugated, (PREMARIN) 0.3 MG tablet Take daily (Patient taking differently: Take 0.15 mg by mouth at bedtime. Tapering down:  Take 1/2 tablet (0.15 mg) daily until the end of May, then stop) 30 tablet 11 01/19/2015  . glycopyrrolate (ROBINUL) 1 MG tablet Take 1-2 mg by mouth 2 (two) times daily. Take 2 tablets (2 mg) every morning and 1 tablet (1 mg) every night   01/19/2015 at pm  . medroxyPROGESTERone (PROVERA) 10 MG tablet TAKE ONE TABLET 10 DAYS OF THE MONTH (Patient taking differently: Take 10 mg by mouth See admin instructions. Take 1 tablet (10 mg) on the 1st thru 10th of each month. Will stop after the 10th of May 2016) 30 tablet 4 01/19/2015  . mupirocin ointment (BACTROBAN) 2 % Applied twice a day to the affected area;NOT into eyes. (Patient taking differently: Apply 1 application topically 2 (two) times daily as needed (ear drainage/swelling). Do not use in eyes) 15 g 0 week ago  . nitrofurantoin, macrocrystal-monohydrate, (MACROBID) 100 MG capsule Take one twice a day for one week then take one after intercourse (Patient taking differently: Take 100 mg by mouth once as needed (after intercourse to prevent UTI). ) 14 capsule 5 month ago  . sertraline (ZOLOFT) 50  MG tablet Take 1 tablet (50 mg total) by mouth daily. (Patient taking differently: Take 50 mg by mouth at bedtime. ) 90 tablet 3 01/19/2015  . celecoxib (CELEBREX) 200 MG capsule Take 200 mg by mouth daily.   01/19/2015  . clarithromycin (BIAXIN XL) 500 MG 24 hr tablet Take 2 tablets (1,000 mg total) by mouth daily. (Patient not taking: Reported on 01/23/2015) 14 tablet 0 Not Taking at Unknown time    Assessment: 69 yo F with PAF to start coumadin per pharmacy protocol.  She is not a candidate for NOACs due to AKI/renal function.   Wt 107.5 kg, baseline INR = 1.18 on 4/11.  Coumadin score = 4.  H/H stable.   No bleeding reported.   Goal of Therapy:  INR 2-3   Plan:  -coumadin 7.5 mg po x 1 dose  today -coumadin book and video for education -daily INR -decrease aspirin to 81 mg once INR > = 2 -DC sq heparin once INR therapeutic  Eudelia Bunch, Pharm.D. [phone] 02/04/2015 12:59 PM

## 2015-02-04 NOTE — Progress Notes (Signed)
[facility]                                                                              Patient Demographics  Amanda Wilkins, is a 69 y.o. female, DOB - 12-Oct-1946, AOZ:[id number]  Admit date - 01/22/2015   Admitting Physician Nelda Bucks, MD  Outpatient Primary MD for the patient is Sonda Primes, MD  LOS - 12   Chief Complaint  Patient presents with  . Irregular Heart Beat       Brief HPI   Patient is a 69 year old female with paroxysmal atrial fibrillation, hypertension, obesity presented with 2 day history of nausea, vomiting, generalized malaise, diarrhea, palpitations and fever. She had vomited several times and was unable to hold down her home medications. Lactic acid was 3.6 at the time of admission. CT chest showed inflammation in the right axilla extending inferiorly along the right chest but no focal evidence of abscess or gas. Patient was admitted to stepdown unit for further workup. BMET at the time of admission showed sodium of 127, creatinine 1.97, bicarbonate 16.   Assessment & Plan   Principal problem Septic shock due to Group A strep cellulitis, erysipelas / chest wall induration with Streptococcus bacteremia  - Improved The patient was admitted with acute viral gastroenteritis and lactic acidosis. CT chest showed inflammation in the right axilla extending inferiorly along the right chest, no focal evidence of abscess or gas. CT abdomen and pelvis showed no renal obstruction, gallbladder distention with possible gallstones. - Renal ultrasound showed no hydronephrosis, cholelithiasis however small left pleural effusion. Patient however became hypotensive, tachycardiac with respiratory distress and PCCM was consulted on 4/11. Patient required critical care support for vasopressors and was off the pressors on 4/13. HIDA scan showed a delayed filling of gallbladder, no evidence of biliary obstruction MRI of the flank showed no focal  pockets of infection/abscess. ID, Dr. Luciana Axe signed off on 4/18, recommended to continue IV ceftriaxone through 4/24. Patient had initially complained of some shoulder pain which has now resolved and per ID, no indication for MRI.  Active problems ?Viral Gastroenteritis - Now resolved, likely her initial GI symptoms were due to sepsis/severe cellulitis, bacteremia  Acute Kidney Injury - improving - Likely due to sepsis/septic shock, atrial fibrillation, hypotension, NSAIDs. - Nephrology following closely, still has hemodialysis catheter  - Discussed with Dr. Abel Presto (renal), recommended to observe, will likely not need further HD, Lasix and discontinued yesterday, should be okay to remove hemodialysis catheter in and asked 24 hours if Cr continues to improve - Follow daily BMET  Persistent dependent Atelectasis  - Incentive spirometry, mobilization  Acute pulm edema  - medical management somewhat difficult due to acute kidney injury, nephrology following, per nephrology likely in the diuretic phase of recovery   Persistent Atrial Fibrillation  - Cardiology following, rate controlled, off of IV Cardizem drip  - CHADS2VASC 3 - cardiology recommending anticoagulation, not a candidate for NOAC due to renal failure, coumadin? - Placed on Coumadin  Anemia- normocytic, likely due to chronic disease - Follow stool occult test, anemia panel  Hypokalemia - Placed on oral replacement  HSV1 exacerbation  Appears  to be resolving - acyclovir initiated 4/19   Hyponatremia  -Likely due to renal insufficiency, improving   Thrombocytopenia  Likely due to consumptive colopathy secondary to sepsis, resolved    Abnormal Myoview  Obtained in March 2016 -reviewed cardiology recommendations, currently no chest pain, cardiac enzymes negative. Defer further evaluation due to renal and additional significant comorbidities.  Obesity - Body mass index is 36.77 kg/(m^2).  -  patient counseled on diet  and weight control   High D-dimer  - doubt PE, CT angiogram not possible due to renal function,  Doppler lower extremity negative for DVT  - Cardiology starting on anticoagulation  Suspected OSA  Saturations remaining stable - will need outpatient sleep study   Code Status:Full code  Family Communication: Discussed in detail with the patient, all imaging results, lab results explained to the patient    Disposition Plan: not medically ready, likely skilled nursing facility on Monday  Time Spent in minutes   28 minutes  Procedures  CT Chest > inflammation in R axilla extending inferiorly along R chest, no focal evidence of abscess or gas, mild dependent atx CT Abd/Pelvis > no renal obs, Gallbladder distention with possible internal gallstone identified Renal US > no hydronephrosis, cholelithiasis, small L pleural effusion  LE Duplex > neg for DVT, RUE Duplex > neg for DVT  HIDA Scan > delayed filling of gallbladder, but the gallbladder fills after morphine administration, no evidence of biliary obstruction   Consults   Cardiology  ID Nephrology   DVT Prophylaxis heparin   Medications  Scheduled Meds: . acyclovir ointment  1 application Topical Q3H  . aspirin  325 mg Oral Daily  . benzonatate  200 mg Oral TID  . bisoprolol  5 mg Oral BID  . cefTRIAXone (ROCEPHIN)  IV  2 g Intravenous Q24H  . chlorpheniramine-HYDROcodone  5 mL Oral Q12H  . diltiazem  60 mg Oral 4 times per day  . estrogens (conjugated)  0.3 mg Oral Q48H  . famotidine  20 mg Oral QHS  . heparin  5,000 Units Subcutaneous 3 times per day  . potassium chloride  40 mEq Oral Daily  . sertraline  50 mg Oral Daily  . valACYclovir  1,000 mg Oral Daily   Continuous Infusions:  PRN Meds:.acetaminophen **OR** acetaminophen, guaifenesin, levalbuterol, loperamide, magic mouthwash w/lidocaine, menthol-cetylpyridinium, ondansetron **OR** ondansetron (ZOFRAN) IV, sodium chloride, traMADol   Antibiotics    Anti-infectives    Start     Dose/Rate Route Frequency Ordered Stop   02/01/15 1200  valACYclovir (VALTREX) tablet 1,000 mg     1,000 mg Oral Daily 02/01/15 1120     01/26/15 1530  cefTRIAXone (ROCEPHIN) 2 g in dextrose 5 % 50 mL IVPB - Premix     2 g 100 mL/hr over 30 Minutes Intravenous Every 24 hours 01/26/15 1505 02/06/15 2359   01/25/15 1200  vancomycin (VANCOCIN) 1,500 mg in sodium chloride 0.9 % 500 mL IVPB  Status:  Discontinued     1,500 mg 250 mL/hr over 120 Minutes Intravenous Every 48 hours 01/23/15 1417 01/25/15 1015   01/25/15 1100  clindamycin (CLEOCIN) IVPB 900 mg  Status:  Discontinued     900 mg 100 mL/hr over 30 Minutes Intravenous 3 times per day 01/25/15 1014 01/30/15 1310   01/24/15 1600  doxycycline (VIBRAMYCIN) 100 mg in dextrose 5 % 250 mL IVPB  Status:  Discontinued     100 mg 125 mL/hr over 120 Minutes Intravenous Every 12 hours 01/24/15 1500 01/25/15 1013  01/24/15 1400  piperacillin-tazobactam (ZOSYN) IVPB 2.25 g  Status:  Discontinued     2.25 g 100 mL/hr over 30 Minutes Intravenous 4 times per day 01/24/15 1148 01/26/15 1505   01/24/15 1000  vancomycin (VANCOCIN) IVPB 1000 mg/200 mL premix  Status:  Discontinued     1,000 mg 200 mL/hr over 60 Minutes Intravenous Every 24 hours 01/23/15 0846 01/23/15 1417   01/23/15 2200  piperacillin-tazobactam (ZOSYN) IVPB 3.375 g  Status:  Discontinued     3.375 g 12.5 mL/hr over 240 Minutes Intravenous Every 8 hours 01/23/15 0846 01/24/15 1148   01/23/15 1000  oseltamivir (TAMIFLU) capsule 30 mg  Status:  Discontinued     30 mg Oral 2 times daily 01/23/15 0803 01/23/15 1413   01/23/15 0900  vancomycin (VANCOCIN) 2,000 mg in sodium chloride 0.9 % 500 mL IVPB     2,000 mg 250 mL/hr over 120 Minutes Intravenous  Once 01/23/15 0846 01/23/15 1401   01/23/15 0845  piperacillin-tazobactam (ZOSYN) IVPB 3.375 g     3.375 g 100 mL/hr over 30 Minutes Intravenous  Once 01/23/15 0846 01/23/15 2013        Subjective:    Amanda Wilkins was seen and examined today.  Patient denies dizziness, chest pain, abdominal pain, N/V/D/C, new weakness, numbess, tingling. No acute events overnight. Denies any fevers or chills. Still having coarse raspy voice otherwise improving  Objective:   Blood pressure 132/54, pulse 95, temperature 97.9 F (36.6 C), temperature source Oral, resp. rate 18, height 5\' 6"  (1.676 m), weight 107.5 kg (236 lb 15.9 oz), SpO2 96 %.  Wt Readings from Last 3 Encounters:  02/04/15 107.5 kg (236 lb 15.9 oz)  01/04/15 103.874 kg (229 lb)  12/16/14 102.513 kg (226 lb)     Intake/Output Summary (Last 24 hours) at 02/04/15 1138 Last data filed at 02/04/15 0904  Gross per 24 hour  Intake    250 ml  Output   1100 ml  Net   -850 ml    Exam  General: Alert and oriented x 3, NAD  HEENT:  PERRLA, EOMI, Anicteic Sclera, mucous membranes moist.   Neck: Supple, no JVD, no masses  CVS: S1 S2 clear, RRR, no MRG  Respiratory: dec BS at bases, no wheezing or rhonchi  Abdomen:  morbidly obese, Soft, NT, ND, NBS  Ext: no cyanosis clubbing, 1+ edema  Neuro: AAOx3, Cr N's II- XII. Strength 5/5 upper and lower extremities bilaterally  Skin: No rashes  Psych: Normal affect and demeanor, alert and oriented x3    Data Review   Micro Results Recent Results (from the past 240 hour(s))  Clostridium Difficile by PCR     Status: None   Collection Time: 02/01/15  4:59 PM  Result Value Ref Range Status   C difficile by pcr NEGATIVE NEGATIVE Final    Radiology Reports Ct Abdomen Pelvis Wo Contrast  01/24/2015   CLINICAL DATA:  Abdominal pain, nausea, vomiting, diarrhea. Fever and chills. Sepsis.  EXAM: CT ABDOMEN AND PELVIS WITHOUT CONTRAST  TECHNIQUE: Multidetector CT imaging of the abdomen and pelvis was performed following the standard protocol without IV contrast.  COMPARISON:  No similar prior exam is available at this institution for comparison or on [facility].  FINDINGS: Lower  chest: Small bilateral pleural effusions are noted. Interlobular septal thickening and bronchial wall thickening are suggestive of interstitial pulmonary edema.  Hepatobiliary: Hepatic hypodensity suggests steatosis. No focal hepatic abnormality or intrahepatic ductal dilatation. Gallbladder distention with probable dependent stone image  34.  Pancreas: Normal  Spleen: Normal  Adrenals/Urinary Tract: Adrenal glands appear normal. Moderate nonspecific bilateral perinephric stranding is identified. No hydroureteronephrosis. A Foley catheter is in place and the bladder is decompressed. No radiopaque renal or ureteral calculus. Renal parenchymal detail is obscured by lack of contrast.  Stomach/Bowel: Stomach appears normal. Normal appendix. No bowel wall thickening or focal segmental dilatation is identified.  Vascular/Lymphatic: No lymphadenopathy. Mild atheromatous aortic calcification identified.  Reproductive: Uterus and ovaries are unremarkable.  Other: Mild soft tissue anasarca is present which may suggest fluid overload. No free air or free intra-abdominal fluid.  Musculoskeletal: No acute osseous abnormality. L5-S1 disc degenerative change.  IMPRESSION: Findings at the lung bases suggest interstitial pulmonary edema with mild soft tissue anasarca.  Nonspecific bilateral perinephric stranding without hydroureteronephrosis or radiopaque renal or ureteral calculus.  Gallbladder distention with possible internal gallstone identified. This again raises the question of possible cholecystitis in the setting of sepsis.   Electronically Signed   By: Christiana Pellant M.D.   On: 01/24/2015 21:04   Dg Chest 2 View  01/22/2015   CLINICAL DATA:  Atrial fibrillation. Shortness of breath with exertion. Right axillary pain. No injury.  EXAM: CHEST  2 VIEW  COMPARISON:  09/22/2014  FINDINGS: Normal heart size and pulmonary vascularity. Perihilar interstitial changes suggesting chronic bronchitis. Linear atelectasis or scarring in  the lung bases. No focal consolidation. No blunting of costophrenic angles. No pneumothorax. Mediastinal contours appear intact. Calcified aorta. Mild degenerative changes in the spine.  IMPRESSION: Chronic bronchitic changes in the lungs with slight fibrosis or linear atelectasis in the lung bases. No focal consolidation.   Electronically Signed   By: Burman Nieves M.D.   On: 01/22/2015 23:46   Ct Chest Wo Contrast  01/23/2015   CLINICAL DATA:  69 year old female with right-sided chest and axillary pain. Sepsis. Fever for 1-2 weeks.  EXAM: CT CHEST WITHOUT CONTRAST  TECHNIQUE: Multidetector CT imaging of the chest was performed following the standard protocol without IV contrast.  COMPARISON:  01/22/2015 chest radiograph. Chest CT report dated 09/22/2014. Images are not available at this time due to PACS downtime.  FINDINGS: Mediastinum/Nodes: The heart and great vessels are unremarkable. There is no evidence of thoracic aortic aneurysm. No pleural or pericardial effusions are identified. No enlarged lymph nodes are noted.  Lungs/Pleura: Mild dependent and basilar atelectasis noted. There is no evidence of airspace disease, consolidation, suspicious nodule, mass or endobronchial/endotracheal lesion.  Upper abdomen: Hepatic steatosis identified.  Musculoskeletal: Right axillary inflammation is identified extending inferiorly along the right chest. There is no evidence of gas or abscess. No acute or suspicious bony abnormalities are identified.  IMPRESSION: Inflammation in the right axilla extending inferiorly along the right chest -suspicious for infection given clinical history. No evidence of focal abscess or gas.  Mild dependent/basilar atelectasis.   Electronically Signed   By: Harmon Pier M.D.   On: 01/23/2015 11:12   Mr Abdomen Wo Contrast  01/29/2015   CLINICAL DATA:  Septic shock. Evaluate subcutaneous soft tissue swelling/ edema involving the abdominal wall.  EXAM: MRI ABDOMEN WITHOUT CONTRAST   TECHNIQUE: Multiplanar multisequence MR imaging was performed without the administration of intravenous contrast.  COMPARISON:  CT scan 01/24/2015  FINDINGS: There is diffuse edema like signal abnormality in the subcutaneous fat involving the entire abdominal wall more significantly on the right side. I do not see a discrete drainable fluid collection to suggest an abscess. No definite involvement of the abdominal muscles. There is however involvement  of the latissimus dorsi muscle on the right side.  Intra-abdominal findings include bowel dilatation and cholelithiasis. No large intra-abdominal fluid collections are identified.  IMPRESSION: Diffuse subcutaneous soft tissue swelling/ edema/fluid likely reflecting cellulitis. There is also myositis involving the right latissimus dorsi muscle. No focal drainable fluid collection/abscess is identified.  Bowel dilatation is noted. No significant intra abdominal fluid collection is identified.  Gallbladder distention and cholelithiasis.   Electronically Signed   By: Rudie Meyer M.D.   On: 01/29/2015 12:56   Nm Hepatobiliary Including Gb  01/26/2015   CLINICAL DATA:  Gallstones.  Distended gallbladder on prior CT.  EXAM: NUCLEAR MEDICINE HEPATOBILIARY IMAGING  TECHNIQUE: Sequential images of the abdomen were obtained out to 60 minutes following intravenous administration of radiopharmaceutical.  RADIOPHARMACEUTICALS:  5.0 Millicurie Tc-56m Choletec  COMPARISON:  CT 01/24/2015  FINDINGS: Prompt uptake and excretion of radiotracer by the liver. Activity is seen in small bowel by 30 minutes. No activity noted in the gallbladder at 90 minutes. Therefore, 4 mg of morphine was administered intravenously. Continued imaging demonstrates filling of the gallbladder post morphine.  IMPRESSION: Delayed filling of the gallbladder, but the gallbladder fills after morphine administration compatible with patent cystic duct. No evidence of biliary obstruction.   Electronically Signed    By: Charlett Nose M.D.   On: 01/26/2015 14:28   [place] Chest  01/25/2015   CLINICAL DATA:  69 year old female with right axillary and chest wall pain. Fever and sepsis. Initial encounter.  EXAM: CHEST ULTRASOUND  COMPARISON:  Chest CT 01/23/2015.  FINDINGS: Ultrasound imaging in the area of clinical concern corresponding to the right axilla and chest wall.  Lymph nodes with normal fatty hila. Diffuse subcutaneous edema. Evidence of edematous chest wall muscle (image 19) with separation of muscle fibers, but no discrete or drainable fluid collection.  IMPRESSION: Subcutaneous and chest wall muscle edema with no discrete or drainable fluid collection. Regional lymph nodes remain normal.   Electronically Signed   By: Odessa Fleming M.D.   On: 01/25/2015 12:11   US Renal  01/24/2015   CLINICAL DATA:  Acute renal insufficiency.  EXAM: RENAL/URINARY TRACT ULTRASOUND COMPLETE  COMPARISON:  11/16/2009  FINDINGS: Right Kidney:  Length: 10.1 cm. Echogenicity within normal limits. No mass or hydronephrosis visualized.  Left Kidney:  Length: 10.4 cm. No hydronephrosis. Mild perinephric edema or trace fluid adjacent the lower pole left kidney.  Bladder:  Collapsed around a Foley catheter.  Exam degraded by patient body habitus. Incidental note is made of a 2.6 cm gallstone and a left-sided pleural effusion.  IMPRESSION: 1.  No hydronephrosis.  No explanation for renal insufficiency. 2. Decreased sensitivity and specificity exam due to technique related factors, as described above. 3. Cholelithiasis. 4. Small left pleural effusion.   Electronically Signed   By: Jeronimo Greaves M.D.   On: 01/24/2015 10:56   Dg Chest Port 1 View  01/28/2015   CLINICAL DATA:  Central line placement.  EXAM: PORTABLE CHEST - 1 VIEW  COMPARISON:  01/28/2015.  FINDINGS: The right IJ central venous catheter tip is in the proximal SVC above the level of the carina. No complicating features are demonstrated. Persistent bibasilar atelectasis. Possible small  right effusion.  IMPRESSION: Right IJ central venous catheter tip is in the proximal SVC. No complicating features.  Persistent bibasilar atelectasis and possible small right effusion.   Electronically Signed   By: Rudie Meyer M.D.   On: 01/28/2015 15:25   Dg Chest Port 1 View  01/28/2015  CLINICAL DATA:  Acute respiratory failure.  EXAM: PORTABLE CHEST - 1 VIEW  COMPARISON:  January 27, 2015.  FINDINGS: Stable cardiomediastinal silhouette. No pneumothorax is noted. Left lung is clear. New right basilar opacity is noted concerning for pneumonia or atelectasis, with possible associated pleural effusion. Left-sided PICC line is noted with distal tip overlying expected position of the SVC.  IMPRESSION: New right basilar opacity is noted concerning for pneumonia or atelectasis with associated pleural effusion. Followup radiographs are recommended.   Electronically Signed   By: Lupita Raider, M.D.   On: 01/28/2015 08:26   Dg Chest Port 1 View  01/27/2015   CLINICAL DATA:  69 year old female with sepsis, septic shock. Initial encounter.  EXAM: PORTABLE CHEST - 1 VIEW  COMPARISON:  01/24/2015 and earlier.  FINDINGS: Portable AP upright view at 0802 hours. Stable left PICC line. Mildly improved lung volumes and bibasilar ventilation. Bilateral increased interstitial markings have regressed and now appear at baseline compared to 2015 Ing stands. No pneumothorax. No pleural effusion or consolidation. Stable cardiac size and mediastinal contours.  IMPRESSION: Regressed bilateral interstitial opacity, lung parenchyma now all appears at baseline compared to 2015 studies. No new cardiopulmonary abnormality.   Electronically Signed   By: Odessa Fleming M.D.   On: 01/27/2015 08:08   Dg Chest Port 1 View  01/24/2015   CLINICAL DATA:  Wheezing and shortness of breath.  EXAM: PORTABLE CHEST - 1 VIEW  COMPARISON:  01/24/2015  FINDINGS: Left-sided PICC line tip overlies the superior vena cava. Heart is mildly prominent. There  Kerley B-lines consistent with pulmonary edema. No focal consolidation. No overt alveolar edema.  IMPRESSION: 1. Mild interstitial edema. 2. Left-sided PICC line tip to the superior vena cava.   Electronically Signed   By: Norva Pavlov M.D.   On: 01/24/2015 22:14   Dg Chest Port 1 View  01/24/2015   CLINICAL DATA:  Hypoxia, pt was admitted with an acute URI and right armpit infection and was found to be in a-fib with RVR, hypovolemic, hyponatremic with an acute renal failure.  EXAM: PORTABLE CHEST - 1 VIEW  COMPARISON:  Radiograph 01/23/2015  FINDINGS: Left-sided PICC line with tip in distal SVC. Stable cardiac silhouette. There is mild central venous congestion. No focal infiltrate. No pulmonary edema. Mild basilar atelectasis.  IMPRESSION: Mild basilar atelectasis and minimal central venous congestion. No change from prior.   Electronically Signed   By: Genevive Bi M.D.   On: 01/24/2015 09:44   Dg Chest Port 1 View  01/23/2015   CLINICAL DATA:  Shortness of breath.  EXAM: PORTABLE CHEST - 1 VIEW  COMPARISON:  Same day.  FINDINGS: The heart size and mediastinal contours are within normal limits. Both lungs are clear. No pneumothorax or pleural effusion is noted. Left-sided PICC line is noted with tip in expected position of cavoatrial junction. The visualized skeletal structures are unremarkable.  IMPRESSION: No acute cardiopulmonary abnormality seen.   Electronically Signed   By: Lupita Raider, M.D.   On: 01/23/2015 18:52   Dg Chest Port 1 View  01/23/2015   CLINICAL DATA:  PICC line placement  EXAM: PORTABLE CHEST - 1 VIEW  COMPARISON:  CT scan of the chest same day  FINDINGS: Cardiomediastinal silhouette is unremarkable. No acute infiltrate or pulmonary edema. There is a left arm PICC line with tip in right atrium. For distal SVC position the PICC line should be retracted about 1 cm. No pneumothorax.  IMPRESSION: Left arm PICC line with  tip in right atrium. For distal SVC position PICC line  should be retracted about 1 cm. No pneumothorax.   Electronically Signed   By: Natasha Mead M.D.   On: 01/23/2015 17:45   US Abdomen Limited Ruq  01/25/2015   CLINICAL DATA:  Upper abdominal pain  EXAM: US ABDOMEN LIMITED - RIGHT UPPER QUADRANT  COMPARISON:  None.  FINDINGS: Gallbladder:  There is a a focal gallstone measuring 1.9 cm in length which moves and shadows. Gallbladder wall is not thickened, and there is no pericholecystic fluid. No sonographic Murphy sign noted.  Common bile duct:  Diameter: 5 mm. No intrahepatic or extrahepatic biliary duct dilatation.  Liver:  No focal lesion identified. Liver echogenicity is diffusely increased.  There is a small right pleural effusion.  IMPRESSION: Cholelithiasis. Increased liver echogenicity, most likely due to hepatic steatosis. While no focal liver lesions are identified, it must be cautioned that the sensitivity of ultrasound for focal liver lesions is diminished in this circumstance. Small right pleural effusion noted.   Electronically Signed   By: Bretta Bang III M.D.   On: 01/25/2015 12:15    CBC  Recent Labs Lab 01/31/15 0555 02/01/15 0422 02/02/15 0230 02/03/15 0456 02/04/15 0440  WBC 14.2* 14.4* 11.3* 9.9 8.4  HGB 9.7* 9.4* 9.3* 9.4* 9.1*  HCT 28.2* 27.1* 26.6* 27.0* 26.2*  PLT 203 207 211 257 264  MCV 97.9 97.8 96.0 95.4 97.0  MCH 33.7 33.9 33.6 33.2 33.7  MCHC 34.4 34.7 35.0 34.8 34.7  RDW 14.4 14.1 13.8 13.8 14.0  LYMPHSABS  --   --  1.8  --   --   MONOABS  --   --  0.5  --   --   EOSABS  --   --  0.1  --   --   BASOSABS  --   --  0.0  --   --     Chemistries   Recent Labs Lab 01/29/15 0713  01/31/15 0555 02/01/15 0422 02/02/15 0230 02/03/15 0440 02/04/15 0440  NA 130*  < > 131* 129* 130* 132* 133*  K 3.6  < > 3.9 3.1* 3.0* 2.9* 3.3*  CL 90*  < > 93* 90* 91* 93* 94*  CO2 24  < > 26 26 27 28 27   GLUCOSE 149*  < > 130* 105* 110* 114* 109*  BUN 48*  < > 33* 36* 37* 39* 39*  CREATININE 3.82*  < > 3.45* 3.81*  3.93* 3.87* 3.77*  CALCIUM 7.6*  < > 8.3* 8.3* 8.1* 8.2* 8.2*  MG  --   --   --   --  1.6  --   --   AST 35  --   --   --  23  --   --   ALT 41*  --   --   --  23  --   --   ALKPHOS 109  --   --   --  57  --   --   BILITOT 0.6  --   --   --  0.5  --   --   < > = values in this interval not displayed. ------------------------------------------------------------------------------------------------------------------ estimated creatinine clearance is 17.7 mL/min (by C-G formula based on Cr of 3.77). ------------------------------------------------------------------------------------------------------------------ No results for input(s): HGBA1C in the last 72 hours. ------------------------------------------------------------------------------------------------------------------ No results for input(s): CHOL, HDL, LDLCALC, TRIG, CHOLHDL, LDLDIRECT in the last 72 hours. ------------------------------------------------------------------------------------------------------------------ No results for input(s): TSH, T4TOTAL, T3FREE, THYROIDAB in the last 72  hours.  Invalid input(s): FREET3 ------------------------------------------------------------------------------------------------------------------ No results for input(s): VITAMINB12, FOLATE, FERRITIN, TIBC, IRON, RETICCTPCT in the last 72 hours.  Coagulation profile No results for input(s): INR, PROTIME in the last 168 hours.  No results for input(s): DDIMER in the last 72 hours.  Cardiac Enzymes No results for input(s): CKMB, TROPONINI, MYOGLOBIN in the last 168 hours.  Invalid input(s): CK ------------------------------------------------------------------------------------------------------------------ Invalid input(s): POCBNP  No results for input(s): GLUCAP in the last 72 hours.   Winola Drum M.D. [facility] 02/04/2015, 11:38 AM  Pager: [phone]   Between 7am to 7pm - call Pager - [phone]  After 7pm go to  [email or web address] - password [email or web address]  Call night coverage person covering after 7pm

## 2015-02-04 NOTE — Progress Notes (Signed)
Patient ID: Amanda Wilkins, female   DOB: 06-19-1946, 69 y.o.   MRN: [id number] S:feeling better O:BP 132/54 mmHg  Pulse 95  Temp(Src) 97.9 F (36.6 C) (Oral)  Resp 18  Ht 5\' 6"  (1.676 m)  Wt 107.5 kg (236 lb 15.9 oz)  BMI 38.27 kg/m2  SpO2 96%  Intake/Output Summary (Last 24 hours) at 02/04/15 0910 Last data filed at 02/04/15 0904  Gross per 24 hour  Intake    250 ml  Output   1100 ml  Net   -850 ml   Intake/Output: I/O last 3 completed shifts: In: 510 [P.O.:510] Out: 2001 [Urine:2000; Stool:1]  Intake/Output this shift:  Total I/O In: 10 [I.V.:10] Out: -  Weight change: 2.5 kg (5 lb 8.2 oz) Gen:WD WN WF in NAD CVS:IRR IRR Resp:cta QMV:HQIONG Ext:no edema   Recent Labs Lab 01/29/15 0713 01/30/15 0500 01/31/15 0555 02/01/15 0422 02/02/15 0230 02/03/15 0440 02/04/15 0440  NA 130* 133* 131* 129* 130* 132* 133*  K 3.6 3.8 3.9 3.1* 3.0* 2.9* 3.3*  CL 90* 96 93* 90* 91* 93* 94*  CO2 24 26 26 26 27 28 27   GLUCOSE 149* 113* 130* 105* 110* 114* 109*  BUN 48* 24* 33* 36* 37* 39* 39*  CREATININE 3.82* 2.68* 3.45* 3.81* 3.93* 3.87* 3.77*  ALBUMIN 2.2* 2.2* 2.3* 2.2* 2.1* 2.3*  --   CALCIUM 7.6* 7.8* 8.3* 8.3* 8.1* 8.2* 8.2*  PHOS  --  4.6 5.9* 6.7*  --  5.9*  --   AST 35  --   --   --  23  --   --   ALT 41*  --   --   --  23  --   --    Liver Function Tests:  Recent Labs Lab 01/29/15 0713  02/01/15 0422 02/02/15 0230 02/03/15 0440  AST 35  --   --  23  --   ALT 41*  --   --  23  --   ALKPHOS 109  --   --  57  --   BILITOT 0.6  --   --  0.5  --   PROT 5.7*  --   --  5.2*  --   ALBUMIN 2.2*  < > 2.2* 2.1* 2.3*  < > = values in this interval not displayed. No results for input(s): LIPASE, AMYLASE in the last 168 hours. No results for input(s): AMMONIA in the last 168 hours. CBC:  Recent Labs Lab 01/31/15 0555 02/01/15 0422 02/02/15 0230 02/03/15 0456 02/04/15 0440  WBC 14.2* 14.4* 11.3* 9.9 8.4  NEUTROABS  --   --  8.8*  --   --   HGB 9.7*  9.4* 9.3* 9.4* 9.1*  HCT 28.2* 27.1* 26.6* 27.0* 26.2*  MCV 97.9 97.8 96.0 95.4 97.0  PLT 203 207 211 257 264   Cardiac Enzymes: No results for input(s): CKTOTAL, CKMB, CKMBINDEX, TROPONINI in the last 168 hours. CBG:  Recent Labs Lab 01/28/15 1139  GLUCAP 132*    Iron Studies: No results for input(s): IRON, TIBC, TRANSFERRIN, FERRITIN in the last 72 hours. Studies/Results: No results found. [name] acyclovir ointment  1 application Topical Q3H  . aspirin  325 mg Oral Daily  . benzonatate  200 mg Oral TID  . bisoprolol  5 mg Oral BID  . cefTRIAXone (ROCEPHIN)  IV  2 g Intravenous Q24H  . chlorpheniramine-HYDROcodone  5 mL Oral Q12H  . diltiazem  60 mg Oral 4 times per day  . estrogens (conjugated)  0.3 mg Oral Q48H  . famotidine  20 mg Oral QHS  . heparin  5,000 Units Subcutaneous 3 times per day  . potassium chloride  40 mEq Oral Daily  . sertraline  50 mg Oral Daily  . valACYclovir  1,000 mg Oral Daily    BMET    Component Value Date/Time   NA 133* 02/04/2015 0440   K 3.3* 02/04/2015 0440   CL 94* 02/04/2015 0440   CO2 27 02/04/2015 0440   GLUCOSE 109* 02/04/2015 0440   BUN 39* 02/04/2015 0440   CREATININE 3.77* 02/04/2015 0440   CREATININE 0.68 05/27/2014 0923   CALCIUM 8.2* 02/04/2015 0440   GFRNONAA 11* 02/04/2015 0440   GFRAA 13* 02/04/2015 0440   CBC    Component Value Date/Time   WBC 8.4 02/04/2015 0440   RBC 2.70* 02/04/2015 0440   HGB 9.1* 02/04/2015 0440   HCT 26.2* 02/04/2015 0440   PLT 264 02/04/2015 0440   MCV 97.0 02/04/2015 0440   MCH 33.7 02/04/2015 0440   MCHC 34.7 02/04/2015 0440   RDW 14.0 02/04/2015 0440   LYMPHSABS 1.8 02/02/2015 0230   MONOABS 0.5 02/02/2015 0230   EOSABS 0.1 02/02/2015 0230   BASOSABS 0.0 02/02/2015 0230     Assessment/Plan:  1. AKI/CKD in setting of sepsis. First HD on 01/28/15 due to pulmonary edema and SOB. She had a second session of HD on 01/29/15 but none since then 1. Finally seeing an improvement of  her Scr following marked increase in UOP. Likely in diuretic phase of recovery. 2. Hold off on further HD for now and follow closely.  3. Stopped lasix yesterday and now with improvement in Scr. 4. Should be ok to remove HD cath in next 24 hours if Scr continues to improve 2. Group A strep sepsis- ID following, on IV rocephin 3. Hypokalemia- replete and follow. 4. A fib with RVR- on dilt drip, Cardiology following. 5. Hyponatremia- follow with diuresis, will decrease lasix dose and frequency. 6. Oral ulcers- per primary svc. 7. Metabolic acidosis- resolved. 8. SIRS- improving 9. Anemia- follow and transfuse prn 10. DM- per primary svc  [name]

## 2015-02-04 NOTE — Progress Notes (Signed)
Medicare Important Message given? YES  (If response is "NO", the following Medicare IM given date fields will be blank)  Date Medicare IM given: 02/04/15 Medicare IM given by:  Dahlia Client [employer]

## 2015-02-05 LAB — BASIC METABOLIC PANEL
ANION GAP: 13 (ref 5–15)
BUN: 40 mg/dL — ABNORMAL HIGH (ref 6–23)
CO2: 25 mmol/L (ref 19–32)
Calcium: 8.6 mg/dL (ref 8.4–10.5)
Chloride: 97 mmol/L (ref 96–112)
Creatinine, Ser: 3.65 mg/dL — ABNORMAL HIGH (ref 0.50–1.10)
GFR calc non Af Amer: 12 mL/min — ABNORMAL LOW (ref >90)
GFR, EST AFRICAN AMERICAN: 14 mL/min — AB (ref >90)
Glucose, Bld: 108 mg/dL — ABNORMAL HIGH (ref 70–99)
Potassium: 3.6 mmol/L (ref 3.5–5.1)
Sodium: 135 mmol/L (ref 135–145)

## 2015-02-05 LAB — CBC
HCT: 25.6 % — ABNORMAL LOW (ref 36.0–46.0)
Hemoglobin: 8.8 g/dL — ABNORMAL LOW (ref 12.0–15.0)
MCH: 33.6 pg (ref 26.0–34.0)
MCHC: 34.4 g/dL (ref 30.0–36.0)
MCV: 97.7 fL (ref 78.0–100.0)
Platelets: 268 10*3/uL (ref 150–400)
RBC: 2.62 MIL/uL — ABNORMAL LOW (ref 3.87–5.11)
RDW: 14 % (ref 11.5–15.5)
WBC: 7.5 10*3/uL (ref 4.0–10.5)

## 2015-02-05 LAB — PROTIME-INR
INR: 1 (ref 0.00–1.49)
Prothrombin Time: 13.3 seconds (ref 11.6–15.2)

## 2015-02-05 LAB — OCCULT BLOOD X 1 CARD TO LAB, STOOL: FECAL OCCULT BLD: POSITIVE — AB

## 2015-02-05 MED ORDER — BISOPROLOL FUMARATE 10 MG PO TABS
10.0000 mg | ORAL_TABLET | Freq: Two times a day (BID) | ORAL | Status: DC
  Administered 2015-02-05 – 2015-02-09 (×8): 10 mg via ORAL
  Filled 2015-02-05 (×11): qty 1

## 2015-02-05 MED ORDER — WARFARIN SODIUM 7.5 MG PO TABS
7.5000 mg | ORAL_TABLET | Freq: Once | ORAL | Status: AC
  Administered 2015-02-05: 7.5 mg via ORAL
  Filled 2015-02-05: qty 1

## 2015-02-05 NOTE — Progress Notes (Signed)
ANTICOAGULATION CONSULT NOTE - Follow Up Consult  Pharmacy Consult for coumadin Indication: atrial fibrillation  Allergies  Allergen Reactions  . Epinephrine Other (See Comments)    Heart rate soars  . Epinephrine Base Other (See Comments)    Seriously increases heart rate  . Aspirin Other (See Comments)    Can take the coated 325 mg. Plain asa 325 mg speeds up the heart.  . Penicillins Other (See Comments)    Pt previously has been told not to take because of family history of reactions (water blisters). She took amoxicillin in 2012-2013 with no reaction  . Tape Other (See Comments)    Blisters from adhesive tape    Patient Measurements: Height: 5\' 6"  (167.6 cm) Weight: 231 lb 7.7 oz (105 kg) IBW/kg (Calculated) : 59.3  Vital Signs: Temp: 97.6 F (36.4 C) (04/23 1540) Temp Source: Oral (04/23 1540) BP: 124/68 mmHg (04/23 1540) Pulse Rate: 99 (04/23 1540)  Labs:  Recent Labs  02/03/15 0440  02/03/15 0456 02/04/15 0440 02/05/15 0440  HGB  --   < > 9.4* 9.1* 8.8*  HCT  --   --  27.0* 26.2* 25.6*  PLT  --   --  257 264 268  LABPROT  --   --   --   --  13.3  INR  --   --   --   --  1.00  CREATININE 3.87*  --   --  3.77* 3.65*  < > = values in this interval not displayed.  Estimated Creatinine Clearance: 18.1 mL/min (by C-G formula based on Cr of 3.65).   Medical History: Past Medical History  Diagnosis Date  . Allergy   . Atrial fibrillation   . Migraines     on Zoloft for migraines  . Hypertension   . Chronic interstitial nephritis   . Depression   . Palpitations   . Obesity   . Arthritis     ON BOTH KNEES    Medications:  Prescriptions prior to admission  Medication Sig Dispense Refill Last Dose  . aspirin EC 325 MG tablet Take 325 mg by mouth at bedtime.    01/19/2015  . atenolol (TENORMIN) 50 MG tablet TAKE 1 TABLET BY MOUTH DAILY (Patient taking differently: TAKE 1 TABLET BY MOUTH DAILY AT BEDTIME) 90 tablet 3 01/19/2015 at 2230  . Cholecalciferol  (VITAMIN D) 2000 UNITS tablet Take 2,000 Units by mouth daily.   01/19/2015  . DILT-XR 180 MG 24 hr capsule TAKE 1 CAPSULE BY MOUTH DAILY 90 capsule 3 01/19/2015  . estrogens, conjugated, (PREMARIN) 0.3 MG tablet Take daily (Patient taking differently: Take 0.15 mg by mouth at bedtime. Tapering down:  Take 1/2 tablet (0.15 mg) daily until the end of May, then stop) 30 tablet 11 01/19/2015  . glycopyrrolate (ROBINUL) 1 MG tablet Take 1-2 mg by mouth 2 (two) times daily. Take 2 tablets (2 mg) every morning and 1 tablet (1 mg) every night   01/19/2015 at pm  . medroxyPROGESTERone (PROVERA) 10 MG tablet TAKE ONE TABLET 10 DAYS OF THE MONTH (Patient taking differently: Take 10 mg by mouth See admin instructions. Take 1 tablet (10 mg) on the 1st thru 10th of each month. Will stop after the 10th of May 2016) 30 tablet 4 01/19/2015  . mupirocin ointment (BACTROBAN) 2 % Applied twice a day to the affected area;NOT into eyes. (Patient taking differently: Apply 1 application topically 2 (two) times daily as needed (ear drainage/swelling). Do not use in eyes) 15  g 0 week ago  . nitrofurantoin, macrocrystal-monohydrate, (MACROBID) 100 MG capsule Take one twice a day for one week then take one after intercourse (Patient taking differently: Take 100 mg by mouth once as needed (after intercourse to prevent UTI). ) 14 capsule 5 month ago  . sertraline (ZOLOFT) 50 MG tablet Take 1 tablet (50 mg total) by mouth daily. (Patient taking differently: Take 50 mg by mouth at bedtime. ) 90 tablet 3 01/19/2015  . celecoxib (CELEBREX) 200 MG capsule Take 200 mg by mouth daily.   01/19/2015  . clarithromycin (BIAXIN XL) 500 MG 24 hr tablet Take 2 tablets (1,000 mg total) by mouth daily. (Patient not taking: Reported on 01/23/2015) 14 tablet 0 Not Taking at Unknown time    Assessment: 69 yo F with PAF to start coumadin per pharmacy protocol.  She is not a candidate for NOACs due to AKI/renal function.   Wt 107.5 kg, baseline INR = 1.18 on 4/11.   Coumadin score = 4.  H/H stable.   No bleeding reported.  S/p warfarin 7.5mg  x1 last pm - INR 1.1 - CBC stable  Goal of Therapy:  INR 2-3   Plan:  -coumadin 7.5 mg po x 1 dose today -daily INR  Bonnita Nasuti Pharm.D. CPP, BCPS Clinical Pharmacist [phone] 02/05/2015 4:15 PM

## 2015-02-05 NOTE — Progress Notes (Signed)
Subjective:  Feeling better today.  No complaints of shortness of breath, mild coughing.  Objective:  Vital Signs in the last 24 hours: BP 125/65 mmHg  Pulse 99  Temp(Src) 98.2 F (36.8 C) (Oral)  Resp 17  Ht 5\' 6"  (1.676 m)  Wt 105 kg (231 lb 7.7 oz)  BMI 37.38 kg/m2  SpO2 95%  Physical Exam: Obese female in no acute distress Lungs:  Clear  Cardiac:  irregular rhythm, normal S1 and S2, no S3 Abdomen:  Soft, nontender, no masses Extremities:  No edema present  Intake/Output from previous day: 04/22 0701 - 04/23 0700 In: 730 [P.O.:720; I.V.:10] Out: 1700 [Urine:1700] Weight Filed Weights   02/03/15 0413 02/04/15 0531 02/05/15 0524  Weight: 105 kg (231 lb 7.7 oz) 107.5 kg (236 lb 15.9 oz) 105 kg (231 lb 7.7 oz)    Lab Results: Basic Metabolic Panel:  Recent Labs  02/04/15 0440 02/05/15 0440  NA 133* 135  K 3.3* 3.6  CL 94* 97  CO2 27 25  GLUCOSE 109* 108*  BUN 39* 40*  CREATININE 3.77* 3.65*    CBC:  Recent Labs  02/04/15 0440 02/05/15 0440  WBC 8.4 7.5  HGB 9.1* 8.8*  HCT 26.2* 25.6*  MCV 97.0 97.7  PLT 264 268     PROTIME: Lab Results  Component Value Date   INR 1.00 02/05/2015   INR 1.18 01/24/2015   INR 0.9 09/21/2008    Telemetry: Atrial fibrillation with somewhat rapid response today  Assessment/Plan:  1.  Paroxysmal atrial fibrillation with somewhat rapid response today-plan to increase medicines 2.  Acute on chronic kidney disease 3.  History of sepsis 4.  Diabetes mellitus  Recommendations:  Atrial fib rate is still increased.  Continue warfarin anticoagulation loading and do rate control.  Discontinue aspirin.      Kerry Hough  MD [facility]  02/05/2015, 12:37 PM

## 2015-02-05 NOTE — Clinical Social Work Note (Signed)
Clinical Social Worker met with patient at bedside in reference to SNF decision. Pt reported her husband is currently visiting facilities and family will make a decision very soon.   CSW will continue to follow pt and pt's family for continued support and to facilitate pt's discharge needs once medically stable.   FL-2 signed.   Glendon Axe, MSW, LCSWA [phone] 02/05/2015 2:26 PM

## 2015-02-05 NOTE — Progress Notes (Signed)
[facility]                                                                              Patient Demographics  Amanda Wilkins, is a 69 y.o. female, DOB - 02-06-1946, PPI:[id number]  Admit date - 01/22/2015   Admitting Physician Raylene Miyamoto, MD  Outpatient Primary MD for the patient is [name], MD  LOS - 53   Chief Complaint  Patient presents with  . Irregular Heart Beat       Brief HPI   Patient is a 69 year old female with paroxysmal atrial fibrillation, hypertension, obesity presented with 2 day history of nausea, vomiting, generalized malaise, diarrhea, palpitations and fever. She had vomited several times and was unable to hold down her home medications. Lactic acid was 3.6 at the time of admission. CT chest showed inflammation in the right axilla extending inferiorly along the right chest but no focal evidence of abscess or gas. Patient was admitted to stepdown unit for further workup. BMET at the time of admission showed sodium of 127, creatinine 1.97, bicarbonate 16.   Assessment & Plan   Principal problem Septic shock due to Group A strep cellulitis, erysipelas / chest wall induration with Streptococcus bacteremia  - Improved The patient was admitted with acute viral gastroenteritis and lactic acidosis. CT chest showed inflammation in the right axilla extending inferiorly along the right chest, no focal evidence of abscess or gas. CT abdomen and pelvis showed no renal obstruction, gallbladder distention with possible gallstones. - Renal ultrasound showed no hydronephrosis, cholelithiasis however small left pleural effusion. Patient however became hypotensive, tachycardiac with respiratory distress and PCCM was consulted on 4/11. Patient required critical care support for vasopressors and was off the pressors on 4/13. HIDA scan showed a delayed filling of gallbladder, no evidence of biliary obstruction MRI of the flank showed no focal  pockets of infection/abscess. ID, Dr. Linus Salmons signed off on 4/18, recommended to continue IV ceftriaxone through 4/24. Patient had initially complained of some shoulder pain which has now resolved and per ID, no indication for MRI.  Active problems ?Viral Gastroenteritis - Now resolved, likely her initial GI symptoms were due to sepsis/severe cellulitis, bacteremia  Acute Kidney Injury - improving - Likely due to sepsis/septic shock, atrial fibrillation, hypotension, NSAIDs. - Nephrology following closely, still has hemodialysis catheter  - Discussed with Dr. Arty Baumgartner (renal), recommended to observe, will likely not need further HD - DC Foley catheter and HD catheter per nephrology - Follow daily BMET  Persistent dependent Atelectasis  - Incentive spirometry, mobilization  Acute pulm edema  - medical management somewhat difficult due to acute kidney injury, nephrology following, per nephrology likely in the diuretic phase of recovery   Persistent Atrial Fibrillation  - Cardiology following, rate controlled, off of IV Cardizem drip  - CHADS2VASC 3 - cardiology recommending anticoagulation, not a candidate for NOAC due to renal failure, coumadin? - Placed on Coumadin, following hemoglobin  Anemia- normocytic, likely due to chronic disease - Stool occult test pending - Anemia panel consistent with anemia of chronic disease ferritin 689  Hypokalemia - Placed on oral replacement  HSV1 exacerbation  Appears to  be resolving - acyclovir initiated 4/19   Hyponatremia  -Likely due to renal insufficiency, improving   Thrombocytopenia  Likely due to consumptive colopathy secondary to sepsis, resolved    Abnormal Myoview  Obtained in March 2016 -reviewed cardiology recommendations, currently no chest pain, cardiac enzymes negative. Defer further evaluation due to renal and additional significant comorbidities.  Obesity - Body mass index is 36.77 kg/(m^2).  -  patient counseled on diet  and weight control   High D-dimer  - doubt PE, CT angiogram not possible due to renal function,  Doppler lower extremity negative for DVT  - Cardiology starting on anticoagulation  Suspected OSA  Saturations remaining stable - will need outpatient sleep study   Code Status:Full code  Family Communication: Discussed in detail with the patient, all imaging results, lab results explained to the patient    Disposition Plan: not medically ready, likely skilled nursing facility on Monday  Time Spent in minutes   28 minutes  Procedures  CT Chest > inflammation in R axilla extending inferiorly along R chest, no focal evidence of abscess or gas, mild dependent atx CT Abd/Pelvis > no renal obs, Gallbladder distention with possible internal gallstone identified Renal US > no hydronephrosis, cholelithiasis, small L pleural effusion  LE Duplex > neg for DVT, RUE Duplex > neg for DVT  HIDA Scan > delayed filling of gallbladder, but the gallbladder fills after morphine administration, no evidence of biliary obstruction   Consults   Cardiology  ID Nephrology   DVT Prophylaxis heparin   Medications  Scheduled Meds: . acyclovir ointment  1 application Topical T7D  . benzonatate  200 mg Oral TID  . bisoprolol  7.5 mg Oral BID  . cefTRIAXone (ROCEPHIN)  IV  2 g Intravenous Q24H  . chlorpheniramine-HYDROcodone  5 mL Oral Q12H  . diltiazem  240 mg Oral Daily  . estrogens (conjugated)  0.3 mg Oral Q48H  . famotidine  20 mg Oral QHS  . potassium chloride  40 mEq Oral Daily  . sertraline  50 mg Oral Daily  . valACYclovir  1,000 mg Oral Daily  . Warfarin - Pharmacist Dosing Inpatient   Does not apply q1800   Continuous Infusions:  PRN Meds:.acetaminophen **OR** acetaminophen, guaifenesin, levalbuterol, loperamide, magic mouthwash w/lidocaine, menthol-cetylpyridinium, ondansetron **OR** ondansetron (ZOFRAN) IV, sodium chloride, traMADol   Antibiotics   Anti-infectives    Start      Dose/Rate Route Frequency Ordered Stop   02/01/15 1200  valACYclovir (VALTREX) tablet 1,000 mg     1,000 mg Oral Daily 02/01/15 1120     01/26/15 1530  cefTRIAXone (ROCEPHIN) 2 g in dextrose 5 % 50 mL IVPB - Premix     2 g 100 mL/hr over 30 Minutes Intravenous Every 24 hours 01/26/15 1505 02/06/15 2359   01/25/15 1200  vancomycin (VANCOCIN) 1,500 mg in sodium chloride 0.9 % 500 mL IVPB  Status:  Discontinued     1,500 mg 250 mL/hr over 120 Minutes Intravenous Every 48 hours 01/23/15 1417 01/25/15 1015   01/25/15 1100  clindamycin (CLEOCIN) IVPB 900 mg  Status:  Discontinued     900 mg 100 mL/hr over 30 Minutes Intravenous 3 times per day 01/25/15 1014 01/30/15 1310   01/24/15 1600  doxycycline (VIBRAMYCIN) 100 mg in dextrose 5 % 250 mL IVPB  Status:  Discontinued     100 mg 125 mL/hr over 120 Minutes Intravenous Every 12 hours 01/24/15 1500 01/25/15 1013   01/24/15 1400  piperacillin-tazobactam (ZOSYN) IVPB 2.25 g  Status:  Discontinued     2.25 g 100 mL/hr over 30 Minutes Intravenous 4 times per day 01/24/15 1148 01/26/15 1505   01/24/15 1000  vancomycin (VANCOCIN) IVPB 1000 mg/200 mL premix  Status:  Discontinued     1,000 mg 200 mL/hr over 60 Minutes Intravenous Every 24 hours 01/23/15 0846 01/23/15 1417   01/23/15 2200  piperacillin-tazobactam (ZOSYN) IVPB 3.375 g  Status:  Discontinued     3.375 g 12.5 mL/hr over 240 Minutes Intravenous Every 8 hours 01/23/15 0846 01/24/15 1148   01/23/15 1000  oseltamivir (TAMIFLU) capsule 30 mg  Status:  Discontinued     30 mg Oral 2 times daily 01/23/15 0803 01/23/15 1413   01/23/15 0900  vancomycin (VANCOCIN) 2,000 mg in sodium chloride 0.9 % 500 mL IVPB     2,000 mg 250 mL/hr over 120 Minutes Intravenous  Once 01/23/15 0846 01/23/15 1401   01/23/15 0845  piperacillin-tazobactam (ZOSYN) IVPB 3.375 g     3.375 g 100 mL/hr over 30 Minutes Intravenous  Once 01/23/15 0846 01/23/15 2013        Subjective:   Adreonna Yontz was seen and  examined today.  Patient denies dizziness, chest pain, abdominal pain, N/V/D/C, new weakness, numbess, tingling. Denies any fevers or chills. Feeling better   Objective:   Blood pressure 125/65, pulse 99, temperature 98.2 F (36.8 C), temperature source Oral, resp. rate 17, height 5\' 6"  (1.676 m), weight 105 kg (231 lb 7.7 oz), SpO2 95 %.  Wt Readings from Last 3 Encounters:  02/05/15 105 kg (231 lb 7.7 oz)  01/04/15 103.874 kg (229 lb)  12/16/14 102.513 kg (226 lb)     Intake/Output Summary (Last 24 hours) at 02/05/15 1148 Last data filed at 02/05/15 0800  Gross per 24 hour  Intake    800 ml  Output   1700 ml  Net   -900 ml    Exam  General: Alert and oriented x 3, NAD  HEENT:  PERRLA, EOMI, Anicteic  Neck: Supple, no JVD  CVS: S1 S2 clear, RRR, no MRG  Respiratory: Clear to auscultation bilaterally  Abdomen:  morbidly obese, Soft, nontender, nondistended, normal bowel sounds  Ext: no cyanosis clubbing, 1+ edema  Neuro: AAOx3, Cr N's II- XII. Strength 5/5 upper and lower extremities bilaterally  Skin: No rashes  Psych: Normal affect and demeanor, alert and oriented x3    Data Review   Micro Results Recent Results (from the past 240 hour(s))  Clostridium Difficile by PCR     Status: None   Collection Time: 02/01/15  4:59 PM  Result Value Ref Range Status   C difficile by pcr NEGATIVE NEGATIVE Final    Radiology Reports Ct Abdomen Pelvis Wo Contrast  01/24/2015   CLINICAL DATA:  Abdominal pain, nausea, vomiting, diarrhea. Fever and chills. Sepsis.  EXAM: CT ABDOMEN AND PELVIS WITHOUT CONTRAST  TECHNIQUE: Multidetector CT imaging of the abdomen and pelvis was performed following the standard protocol without IV contrast.  COMPARISON:  No similar prior exam is available at this institution for comparison or on [facility].  FINDINGS: Lower chest: Small bilateral pleural effusions are noted. Interlobular septal thickening and bronchial wall thickening are  suggestive of interstitial pulmonary edema.  Hepatobiliary: Hepatic hypodensity suggests steatosis. No focal hepatic abnormality or intrahepatic ductal dilatation. Gallbladder distention with probable dependent stone image 34.  Pancreas: Normal  Spleen: Normal  Adrenals/Urinary Tract: Adrenal glands appear normal. Moderate nonspecific bilateral perinephric stranding is identified. No hydroureteronephrosis. A Foley catheter  is in place and the bladder is decompressed. No radiopaque renal or ureteral calculus. Renal parenchymal detail is obscured by lack of contrast.  Stomach/Bowel: Stomach appears normal. Normal appendix. No bowel wall thickening or focal segmental dilatation is identified.  Vascular/Lymphatic: No lymphadenopathy. Mild atheromatous aortic calcification identified.  Reproductive: Uterus and ovaries are unremarkable.  Other: Mild soft tissue anasarca is present which may suggest fluid overload. No free air or free intra-abdominal fluid.  Musculoskeletal: No acute osseous abnormality. L5-S1 disc degenerative change.  IMPRESSION: Findings at the lung bases suggest interstitial pulmonary edema with mild soft tissue anasarca.  Nonspecific bilateral perinephric stranding without hydroureteronephrosis or radiopaque renal or ureteral calculus.  Gallbladder distention with possible internal gallstone identified. This again raises the question of possible cholecystitis in the setting of sepsis.   Electronically Signed   By: Conchita Paris M.D.   On: 01/24/2015 21:04   Dg Chest 2 View  01/22/2015   CLINICAL DATA:  Atrial fibrillation. Shortness of breath with exertion. Right axillary pain. No injury.  EXAM: CHEST  2 VIEW  COMPARISON:  09/22/2014  FINDINGS: Normal heart size and pulmonary vascularity. Perihilar interstitial changes suggesting chronic bronchitis. Linear atelectasis or scarring in the lung bases. No focal consolidation. No blunting of costophrenic angles. No pneumothorax. Mediastinal contours  appear intact. Calcified aorta. Mild degenerative changes in the spine.  IMPRESSION: Chronic bronchitic changes in the lungs with slight fibrosis or linear atelectasis in the lung bases. No focal consolidation.   Electronically Signed   By: Lucienne Capers M.D.   On: 01/22/2015 23:46   Ct Chest Wo Contrast  01/23/2015   CLINICAL DATA:  69 year old female with right-sided chest and axillary pain. Sepsis. Fever for 1-2 weeks.  EXAM: CT CHEST WITHOUT CONTRAST  TECHNIQUE: Multidetector CT imaging of the chest was performed following the standard protocol without IV contrast.  COMPARISON:  01/22/2015 chest radiograph. Chest CT report dated 09/22/2014. Images are not available at this time due to PACS downtime.  FINDINGS: Mediastinum/Nodes: The heart and great vessels are unremarkable. There is no evidence of thoracic aortic aneurysm. No pleural or pericardial effusions are identified. No enlarged lymph nodes are noted.  Lungs/Pleura: Mild dependent and basilar atelectasis noted. There is no evidence of airspace disease, consolidation, suspicious nodule, mass or endobronchial/endotracheal lesion.  Upper abdomen: Hepatic steatosis identified.  Musculoskeletal: Right axillary inflammation is identified extending inferiorly along the right chest. There is no evidence of gas or abscess. No acute or suspicious bony abnormalities are identified.  IMPRESSION: Inflammation in the right axilla extending inferiorly along the right chest -suspicious for infection given clinical history. No evidence of focal abscess or gas.  Mild dependent/basilar atelectasis.   Electronically Signed   By: Margarette Canada M.D.   On: 01/23/2015 11:12   Mr Abdomen Wo Contrast  01/29/2015   CLINICAL DATA:  Septic shock. Evaluate subcutaneous soft tissue swelling/ edema involving the abdominal wall.  EXAM: MRI ABDOMEN WITHOUT CONTRAST  TECHNIQUE: Multiplanar multisequence MR imaging was performed without the administration of intravenous contrast.   COMPARISON:  CT scan 01/24/2015  FINDINGS: There is diffuse edema like signal abnormality in the subcutaneous fat involving the entire abdominal wall more significantly on the right side. I do not see a discrete drainable fluid collection to suggest an abscess. No definite involvement of the abdominal muscles. There is however involvement of the latissimus dorsi muscle on the right side.  Intra-abdominal findings include bowel dilatation and cholelithiasis. No large intra-abdominal fluid collections are identified.  IMPRESSION:  Diffuse subcutaneous soft tissue swelling/ edema/fluid likely reflecting cellulitis. There is also myositis involving the right latissimus dorsi muscle. No focal drainable fluid collection/abscess is identified.  Bowel dilatation is noted. No significant intra abdominal fluid collection is identified.  Gallbladder distention and cholelithiasis.   Electronically Signed   By: Marijo Sanes M.D.   On: 01/29/2015 12:56   Nm Hepatobiliary Including Gb  01/26/2015   CLINICAL DATA:  Gallstones.  Distended gallbladder on prior CT.  EXAM: NUCLEAR MEDICINE HEPATOBILIARY IMAGING  TECHNIQUE: Sequential images of the abdomen were obtained out to 60 minutes following intravenous administration of radiopharmaceutical.  RADIOPHARMACEUTICALS:  5.0 [employer] [id number] Choletec  COMPARISON:  CT 01/24/2015  FINDINGS: Prompt uptake and excretion of radiotracer by the liver. Activity is seen in small bowel by 30 minutes. No activity noted in the gallbladder at 90 minutes. Therefore, 4 mg of morphine was administered intravenously. Continued imaging demonstrates filling of the gallbladder post morphine.  IMPRESSION: Delayed filling of the gallbladder, but the gallbladder fills after morphine administration compatible with patent cystic duct. No evidence of biliary obstruction.   Electronically Signed   By: Rolm Baptise M.D.   On: 01/26/2015 14:28   [place] Chest  01/25/2015   CLINICAL DATA:  69 year old female with  right axillary and chest wall pain. Fever and sepsis. Initial encounter.  EXAM: CHEST ULTRASOUND  COMPARISON:  Chest CT 01/23/2015.  FINDINGS: Ultrasound imaging in the area of clinical concern corresponding to the right axilla and chest wall.  Lymph nodes with normal fatty hila. Diffuse subcutaneous edema. Evidence of edematous chest wall muscle (image 19) with separation of muscle fibers, but no discrete or drainable fluid collection.  IMPRESSION: Subcutaneous and chest wall muscle edema with no discrete or drainable fluid collection. Regional lymph nodes remain normal.   Electronically Signed   By: Genevie Ann M.D.   On: 01/25/2015 12:11   US Renal  01/24/2015   CLINICAL DATA:  Acute renal insufficiency.  EXAM: RENAL/URINARY TRACT ULTRASOUND COMPLETE  COMPARISON:  11/16/2009  FINDINGS: Right Kidney:  Length: 10.1 cm. Echogenicity within normal limits. No mass or hydronephrosis visualized.  Left Kidney:  Length: 10.4 cm. No hydronephrosis. Mild perinephric edema or trace fluid adjacent the lower pole left kidney.  Bladder:  Collapsed around a Foley catheter.  Exam degraded by patient body habitus. Incidental note is made of a 2.6 cm gallstone and a left-sided pleural effusion.  IMPRESSION: 1.  No hydronephrosis.  No explanation for renal insufficiency. 2. Decreased sensitivity and specificity exam due to technique related factors, as described above. 3. Cholelithiasis. 4. Small left pleural effusion.   Electronically Signed   By: Abigail Miyamoto M.D.   On: 01/24/2015 10:56   Dg Chest Port 1 View  01/28/2015   CLINICAL DATA:  Central line placement.  EXAM: PORTABLE CHEST - 1 VIEW  COMPARISON:  01/28/2015.  FINDINGS: The right IJ central venous catheter tip is in the proximal SVC above the level of the carina. No complicating features are demonstrated. Persistent bibasilar atelectasis. Possible small right effusion.  IMPRESSION: Right IJ central venous catheter tip is in the proximal SVC. No complicating features.   Persistent bibasilar atelectasis and possible small right effusion.   Electronically Signed   By: Marijo Sanes M.D.   On: 01/28/2015 15:25   Dg Chest Port 1 View  01/28/2015   CLINICAL DATA:  Acute respiratory failure.  EXAM: PORTABLE CHEST - 1 VIEW  COMPARISON:  January 27, 2015.  FINDINGS: Stable cardiomediastinal silhouette. No  pneumothorax is noted. Left lung is clear. New right basilar opacity is noted concerning for pneumonia or atelectasis, with possible associated pleural effusion. Left-sided PICC line is noted with distal tip overlying expected position of the SVC.  IMPRESSION: New right basilar opacity is noted concerning for pneumonia or atelectasis with associated pleural effusion. Followup radiographs are recommended.   Electronically Signed   By: Marijo Conception, M.D.   On: 01/28/2015 08:26   Dg Chest Port 1 View  01/27/2015   CLINICAL DATA:  69 year old female with sepsis, septic shock. Initial encounter.  EXAM: PORTABLE CHEST - 1 VIEW  COMPARISON:  01/24/2015 and earlier.  FINDINGS: Portable AP upright view at 0802 hours. Stable left PICC line. Mildly improved lung volumes and bibasilar ventilation. Bilateral increased interstitial markings have regressed and now appear at baseline compared to [place] stands. No pneumothorax. No pleural effusion or consolidation. Stable cardiac size and mediastinal contours.  IMPRESSION: Regressed bilateral interstitial opacity, lung parenchyma now all appears at baseline compared to 2015 studies. No new cardiopulmonary abnormality.   Electronically Signed   By: Genevie Ann M.D.   On: 01/27/2015 08:08   Dg Chest Port 1 View  01/24/2015   CLINICAL DATA:  Wheezing and shortness of breath.  EXAM: PORTABLE CHEST - 1 VIEW  COMPARISON:  01/24/2015  FINDINGS: Left-sided PICC line tip overlies the superior vena cava. Heart is mildly prominent. There Kerley B-lines consistent with pulmonary edema. No focal consolidation. No overt alveolar edema.  IMPRESSION: 1. Mild  interstitial edema. 2. Left-sided PICC line tip to the superior vena cava.   Electronically Signed   By: Nolon Nations M.D.   On: 01/24/2015 22:14   Dg Chest Port 1 View  01/24/2015   CLINICAL DATA:  Hypoxia, pt was admitted with an acute URI and right armpit infection and was found to be in a-fib with RVR, hypovolemic, hyponatremic with an acute renal failure.  EXAM: PORTABLE CHEST - 1 VIEW  COMPARISON:  Radiograph 01/23/2015  FINDINGS: Left-sided PICC line with tip in distal SVC. Stable cardiac silhouette. There is mild central venous congestion. No focal infiltrate. No pulmonary edema. Mild basilar atelectasis.  IMPRESSION: Mild basilar atelectasis and minimal central venous congestion. No change from prior.   Electronically Signed   By: Suzy Bouchard M.D.   On: 01/24/2015 09:44   Dg Chest Port 1 View  01/23/2015   CLINICAL DATA:  Shortness of breath.  EXAM: PORTABLE CHEST - 1 VIEW  COMPARISON:  Same day.  FINDINGS: The heart size and mediastinal contours are within normal limits. Both lungs are clear. No pneumothorax or pleural effusion is noted. Left-sided PICC line is noted with tip in expected position of cavoatrial junction. The visualized skeletal structures are unremarkable.  IMPRESSION: No acute cardiopulmonary abnormality seen.   Electronically Signed   By: Marijo Conception, M.D.   On: 01/23/2015 18:52   Dg Chest Port 1 View  01/23/2015   CLINICAL DATA:  PICC line placement  EXAM: PORTABLE CHEST - 1 VIEW  COMPARISON:  CT scan of the chest same day  FINDINGS: Cardiomediastinal silhouette is unremarkable. No acute infiltrate or pulmonary edema. There is a left arm PICC line with tip in right atrium. For distal SVC position the PICC line should be retracted about 1 cm. No pneumothorax.  IMPRESSION: Left arm PICC line with tip in right atrium. For distal SVC position PICC line should be retracted about 1 cm. No pneumothorax.   Electronically Signed   By:  Lahoma Crocker M.D.   On: 01/23/2015 17:45     US Abdomen Limited Ruq  01/25/2015   CLINICAL DATA:  Upper abdominal pain  EXAM: US ABDOMEN LIMITED - RIGHT UPPER QUADRANT  COMPARISON:  None.  FINDINGS: Gallbladder:  There is a a focal gallstone measuring 1.9 cm in length which moves and shadows. Gallbladder wall is not thickened, and there is no pericholecystic fluid. No sonographic Murphy sign noted.  Common bile duct:  Diameter: 5 mm. No intrahepatic or extrahepatic biliary duct dilatation.  Liver:  No focal lesion identified. Liver echogenicity is diffusely increased.  There is a small right pleural effusion.  IMPRESSION: Cholelithiasis. Increased liver echogenicity, most likely due to hepatic steatosis. While no focal liver lesions are identified, it must be cautioned that the sensitivity of ultrasound for focal liver lesions is diminished in this circumstance. Small right pleural effusion noted.   Electronically Signed   By: Lowella Grip III M.D.   On: 01/25/2015 12:15    CBC  Recent Labs Lab 02/01/15 0422 02/02/15 0230 02/03/15 0456 02/04/15 0440 02/05/15 0440  WBC 14.4* 11.3* 9.9 8.4 7.5  HGB 9.4* 9.3* 9.4* 9.1* 8.8*  HCT 27.1* 26.6* 27.0* 26.2* 25.6*  PLT 207 211 257 264 268  MCV 97.8 96.0 95.4 97.0 97.7  MCH 33.9 33.6 33.2 33.7 33.6  MCHC 34.7 35.0 34.8 34.7 34.4  RDW 14.1 13.8 13.8 14.0 14.0  LYMPHSABS  --  1.8  --   --   --   MONOABS  --  0.5  --   --   --   EOSABS  --  0.1  --   --   --   BASOSABS  --  0.0  --   --   --     Chemistries   Recent Labs Lab 02/01/15 0422 02/02/15 0230 02/03/15 0440 02/04/15 0440 02/05/15 0440  NA 129* 130* 132* 133* 135  K 3.1* 3.0* 2.9* 3.3* 3.6  CL 90* 91* 93* 94* 97  CO2 26 27 28 27 25   GLUCOSE 105* 110* 114* 109* 108*  BUN 36* 37* 39* 39* 40*  CREATININE 3.81* 3.93* 3.87* 3.77* 3.65*  CALCIUM 8.3* 8.1* 8.2* 8.2* 8.6  MG  --  1.6  --   --   --   AST  --  23  --   --   --   ALT  --  23  --   --   --   ALKPHOS  --  57  --   --   --   BILITOT  --  0.5  --   --   --     ------------------------------------------------------------------------------------------------------------------ estimated creatinine clearance is 18.1 mL/min (by C-G formula based on Cr of 3.65). ------------------------------------------------------------------------------------------------------------------ No results for input(s): HGBA1C in the last 72 hours. ------------------------------------------------------------------------------------------------------------------ No results for input(s): CHOL, HDL, LDLCALC, TRIG, CHOLHDL, LDLDIRECT in the last 72 hours. ------------------------------------------------------------------------------------------------------------------ No results for input(s): TSH, T4TOTAL, T3FREE, THYROIDAB in the last 72 hours.  Invalid input(s): FREET3 ------------------------------------------------------------------------------------------------------------------  Recent Labs  02/04/15 1410  VITAMINB12 1096*  FOLATE 8.0  FERRITIN 689*  TIBC 234*  IRON 22*  RETICCTPCT 2.0    Coagulation profile  Recent Labs Lab 02/05/15 0440  INR 1.00    No results for input(s): DDIMER in the last 72 hours.  Cardiac Enzymes No results for input(s): CKMB, TROPONINI, MYOGLOBIN in the last 168 hours.  Invalid input(s): CK ------------------------------------------------------------------------------------------------------------------ Invalid input(s): POCBNP  No results for input(s): GLUCAP in the last  72 hours.   RAI,RIPUDEEP M.D. [facility] 02/05/2015, 11:48 AM  Pager: [phone]   Between 7am to 7pm - call Pager - [phone]  After 7pm go to [email or web address] - password [email or web address]  Call night coverage person covering after 7pm

## 2015-02-05 NOTE — Progress Notes (Signed)
Patient ID: Amanda Wilkins, female   DOB: [birth year], 69 y.o.   MRN: [id number] S:continues to feel better O:BP 125/65 mmHg  Pulse 99  Temp(Src) 98.2 F (36.8 C) (Oral)  Resp 17  Ht 5\' 6"  (1.676 m)  Wt 105 kg (231 lb 7.7 oz)  BMI 37.38 kg/m2  SpO2 95%  Intake/Output Summary (Last 24 hours) at 02/05/15 0853 Last data filed at 02/05/15 0525  Gross per 24 hour  Intake    730 ml  Output   1700 ml  Net   -970 ml   Intake/Output: I/O last 3 completed shifts: In: 730 [P.O.:720; I.V.:10] Out: 2400 [Urine:2400]  Intake/Output this shift:    Weight change: -2.5 kg (-5 lb 8.2 oz) Gen:WD WN obese WF in NAD CVS:no rub Resp:cta WUJ:[id number] Ext:no edema   Recent Labs Lab 01/30/15 0500 01/31/15 0555 02/01/15 0422 02/02/15 0230 02/03/15 0440 02/04/15 0440 02/05/15 0440  NA 133* 131* 129* 130* 132* 133* 135  K 3.8 3.9 3.1* 3.0* 2.9* 3.3* 3.6  CL 96 93* 90* 91* 93* 94* 97  CO2 26 26 26 27 28 27 25   GLUCOSE 113* 130* 105* 110* 114* 109* 108*  BUN 24* 33* 36* 37* 39* 39* 40*  CREATININE 2.68* 3.45* 3.81* 3.93* 3.87* 3.77* 3.65*  ALBUMIN 2.2* 2.3* 2.2* 2.1* 2.3*  --   --   CALCIUM 7.8* 8.3* 8.3* 8.1* 8.2* 8.2* 8.6  PHOS 4.6 5.9* 6.7*  --  5.9*  --   --   AST  --   --   --  23  --   --   --   ALT  --   --   --  23  --   --   --    Liver Function Tests:  Recent Labs Lab 02/01/15 0422 02/02/15 0230 02/03/15 0440  AST  --  23  --   ALT  --  23  --   ALKPHOS  --  57  --   BILITOT  --  0.5  --   PROT  --  5.2*  --   ALBUMIN 2.2* 2.1* 2.3*   No results for input(s): LIPASE, AMYLASE in the last 168 hours. No results for input(s): AMMONIA in the last 168 hours. CBC:  Recent Labs Lab 02/01/15 0422 02/02/15 0230 02/03/15 0456 02/04/15 0440 02/05/15 0440  WBC 14.4* 11.3* 9.9 8.4 7.5  NEUTROABS  --  8.8*  --   --   --   HGB 9.4* 9.3* 9.4* 9.1* 8.8*  HCT 27.1* 26.6* 27.0* 26.2* 25.6*  MCV 97.8 96.0 95.4 97.0 97.7  PLT 207 211 257 264 268   Cardiac Enzymes: No  results for input(s): CKTOTAL, CKMB, CKMBINDEX, TROPONINI in the last 168 hours. CBG: No results for input(s): GLUCAP in the last 168 hours.  Iron Studies:  Recent Labs  02/04/15 1410  IRON 22*  TIBC 234*  FERRITIN 689*   Studies/Results: No results found. [name] acyclovir ointment  1 application Topical Q3H  . benzonatate  200 mg Oral TID  . bisoprolol  7.5 mg Oral BID  . cefTRIAXone (ROCEPHIN)  IV  2 g Intravenous Q24H  . chlorpheniramine-HYDROcodone  5 mL Oral Q12H  . diltiazem  240 mg Oral Daily  . estrogens (conjugated)  0.3 mg Oral Q48H  . famotidine  20 mg Oral QHS  . potassium chloride  40 mEq Oral Daily  . sertraline  50 mg Oral Daily  . valACYclovir  1,000 mg Oral Daily  . Warfarin -  Pharmacist Dosing Inpatient   Does not apply q1800    BMET    Component Value Date/Time   NA 135 02/05/2015 0440   K 3.6 02/05/2015 0440   CL 97 02/05/2015 0440   CO2 25 02/05/2015 0440   GLUCOSE 108* 02/05/2015 0440   BUN 40* 02/05/2015 0440   CREATININE 3.65* 02/05/2015 0440   CREATININE 0.68 05/27/2014 0923   CALCIUM 8.6 02/05/2015 0440   GFRNONAA 12* 02/05/2015 0440   GFRAA 14* 02/05/2015 0440   CBC    Component Value Date/Time   WBC 7.5 02/05/2015 0440   RBC 2.62* 02/05/2015 0440   RBC 2.66* 02/04/2015 1410   HGB 8.8* 02/05/2015 0440   HCT 25.6* 02/05/2015 0440   PLT 268 02/05/2015 0440   MCV 97.7 02/05/2015 0440   MCH 33.6 02/05/2015 0440   MCHC 34.4 02/05/2015 0440   RDW 14.0 02/05/2015 0440   LYMPHSABS 1.8 02/02/2015 0230   MONOABS 0.5 02/02/2015 0230   EOSABS 0.1 02/02/2015 0230   BASOSABS 0.0 02/02/2015 0230     Assessment/Plan:  1. AKI/CKD in setting of sepsis. First HD on 01/28/15 due to pulmonary edema and SOB. She had a second session of HD on 01/29/15 but none since then 1. Slow improvement of her Scr following marked increase in UOP. Likely in diuretic phase of recovery. 2. Hold off on further HD for now and follow closely.  3. Stopped lasix  02/03/15 and now with improvement in Scr. 4. Should be ok to remove HD cath 5. D/c foley cath 2. Group A strep sepsis- ID following, on IV rocephin 3. Hypokalemia- replete and follow. 4. A fib with RVR- on dilt drip, Cardiology following. 5. Hyponatremia- resolved.  6. Oral ulcers- per primary svc. 7. Metabolic acidosis- resolved. 8. SIRS- improving 9. Anemia- follow and transfuse prn 10. DM- per primary svc  Shallon Yaklin A

## 2015-02-06 LAB — BASIC METABOLIC PANEL
Anion gap: 11 (ref 5–15)
BUN: 37 mg/dL — AB (ref 6–23)
CHLORIDE: 99 mmol/L (ref 96–112)
CO2: 25 mmol/L (ref 19–32)
CREATININE: 3.43 mg/dL — AB (ref 0.50–1.10)
Calcium: 8.6 mg/dL (ref 8.4–10.5)
GFR calc Af Amer: 15 mL/min — ABNORMAL LOW (ref >90)
GFR, EST NON AFRICAN AMERICAN: 13 mL/min — AB (ref >90)
Glucose, Bld: 109 mg/dL — ABNORMAL HIGH (ref 70–99)
POTASSIUM: 4 mmol/L (ref 3.5–5.1)
Sodium: 135 mmol/L (ref 135–145)

## 2015-02-06 LAB — CBC
HCT: 25.1 % — ABNORMAL LOW (ref 36.0–46.0)
Hemoglobin: 8.4 g/dL — ABNORMAL LOW (ref 12.0–15.0)
MCH: 33.3 pg (ref 26.0–34.0)
MCHC: 33.5 g/dL (ref 30.0–36.0)
MCV: 99.6 fL (ref 78.0–100.0)
PLATELETS: 240 10*3/uL (ref 150–400)
RBC: 2.52 MIL/uL — AB (ref 3.87–5.11)
RDW: 14.2 % (ref 11.5–15.5)
WBC: 6.9 10*3/uL (ref 4.0–10.5)

## 2015-02-06 LAB — PROTIME-INR
INR: 1.29 (ref 0.00–1.49)
PROTHROMBIN TIME: 16.2 s — AB (ref 11.6–15.2)

## 2015-02-06 MED ORDER — DILTIAZEM HCL ER COATED BEADS 180 MG PO CP24
180.0000 mg | ORAL_CAPSULE | Freq: Two times a day (BID) | ORAL | Status: DC
  Administered 2015-02-06 – 2015-02-09 (×6): 180 mg via ORAL
  Filled 2015-02-06 (×8): qty 1

## 2015-02-06 NOTE — Progress Notes (Signed)
Patient ID: Amanda Wilkins, female   DOB: 01-15-1946, 69 y.o.   MRN: [id number] S:feeling better O:BP 112/56 mmHg  Pulse 97  Temp(Src) 97.8 F (36.6 C) (Oral)  Resp 18  Ht 5\' 6"  (1.676 m)  Wt 104.2 kg (229 lb 11.5 oz)  BMI 37.10 kg/m2  SpO2 99%  Intake/Output Summary (Last 24 hours) at 02/06/15 0845 Last data filed at 02/06/15 0436  Gross per 24 hour  Intake    410 ml  Output    950 ml  Net   -540 ml   Intake/Output: I/O last 3 completed shifts: In: 730 [P.O.:680; IV Piggyback:50] Out: 2050 [Urine:2050]  Intake/Output this shift:    Weight change: -0.8 kg (-1 lb 12.2 oz) Gen:WD WN obese WF in NAD CVS:no rub Resp:cta OVF:IEPPIR Ext:no edema   Recent Labs Lab 01/31/15 0555 02/01/15 0422 02/02/15 0230 02/03/15 0440 02/04/15 0440 02/05/15 0440 02/06/15 0603  NA 131* 129* 130* 132* 133* 135 135  K 3.9 3.1* 3.0* 2.9* 3.3* 3.6 4.0  CL 93* 90* 91* 93* 94* 97 99  CO2 26 26 27 28 27 25 25   GLUCOSE 130* 105* 110* 114* 109* 108* 109*  BUN 33* 36* 37* 39* 39* 40* 37*  CREATININE 3.45* 3.81* 3.93* 3.87* 3.77* 3.65* 3.43*  ALBUMIN 2.3* 2.2* 2.1* 2.3*  --   --   --   CALCIUM 8.3* 8.3* 8.1* 8.2* 8.2* 8.6 8.6  PHOS 5.9* 6.7*  --  5.9*  --   --   --   AST  --   --  23  --   --   --   --   ALT  --   --  23  --   --   --   --    Liver Function Tests:  Recent Labs Lab 02/01/15 0422 02/02/15 0230 02/03/15 0440  AST  --  23  --   ALT  --  23  --   ALKPHOS  --  57  --   BILITOT  --  0.5  --   PROT  --  5.2*  --   ALBUMIN 2.2* 2.1* 2.3*   No results for input(s): LIPASE, AMYLASE in the last 168 hours. No results for input(s): AMMONIA in the last 168 hours. CBC:  Recent Labs Lab 02/02/15 0230 02/03/15 0456 02/04/15 0440 02/05/15 0440 02/06/15 0603  WBC 11.3* 9.9 8.4 7.5 6.9  NEUTROABS 8.8*  --   --   --   --   HGB 9.3* 9.4* 9.1* 8.8* 8.4*  HCT 26.6* 27.0* 26.2* 25.6* 25.1*  MCV 96.0 95.4 97.0 97.7 99.6  PLT 211 257 264 268 240   Cardiac Enzymes: No  results for input(s): CKTOTAL, CKMB, CKMBINDEX, TROPONINI in the last 168 hours. CBG: No results for input(s): GLUCAP in the last 168 hours.  Iron Studies:  Recent Labs  02/04/15 1410  IRON 22*  TIBC 234*  FERRITIN 689*   Studies/Results: No results found. [name] acyclovir ointment  1 application Topical Q3H  . benzonatate  200 mg Oral TID  . bisoprolol  10 mg Oral BID  . cefTRIAXone (ROCEPHIN)  IV  2 g Intravenous Q24H  . chlorpheniramine-HYDROcodone  5 mL Oral Q12H  . diltiazem  240 mg Oral Daily  . estrogens (conjugated)  0.3 mg Oral Q48H  . famotidine  20 mg Oral QHS  . potassium chloride  40 mEq Oral Daily  . sertraline  50 mg Oral Daily  . valACYclovir  1,000 mg Oral  Daily  . Warfarin - Pharmacist Dosing Inpatient   Does not apply q1800    BMET    Component Value Date/Time   NA 135 02/06/2015 0603   K 4.0 02/06/2015 0603   CL 99 02/06/2015 0603   CO2 25 02/06/2015 0603   GLUCOSE 109* 02/06/2015 0603   BUN 37* 02/06/2015 0603   CREATININE 3.43* 02/06/2015 0603   CREATININE 0.68 05/27/2014 0923   CALCIUM 8.6 02/06/2015 0603   GFRNONAA 13* 02/06/2015 0603   GFRAA 15* 02/06/2015 0603   CBC    Component Value Date/Time   WBC 6.9 02/06/2015 0603   RBC 2.52* 02/06/2015 0603   RBC 2.66* 02/04/2015 1410   HGB 8.4* 02/06/2015 0603   HCT 25.1* 02/06/2015 0603   PLT 240 02/06/2015 0603   MCV 99.6 02/06/2015 0603   MCH 33.3 02/06/2015 0603   MCHC 33.5 02/06/2015 0603   RDW 14.2 02/06/2015 0603   LYMPHSABS 1.8 02/02/2015 0230   MONOABS 0.5 02/02/2015 0230   EOSABS 0.1 02/02/2015 0230   BASOSABS 0.0 02/02/2015 0230     Assessment/Plan:  1. AKI/CKD in setting of sepsis. First HD on 01/28/15 due to pulmonary edema and SOB. She had Wilkins second session of HD on 01/29/15 but none since then 1. Slow improvement of her Scr following marked increase in UOP. Likely in diuretic phase of recovery. 2. Stopped lasix 02/03/15 and now with improvement in Scr. 3. removed HD  cath 02/05/15 4. D/c'd foley cath 02/05/15 5. If Scr returns to baseline of 0.8 will not need outpt follow up 2. Group Wilkins strep sepsis- ID following, on IV rocephin 3. Hypokalemia- replete and follow. 4. Wilkins fib with RVR- on dilt drip, Cardiology following. 5. Hyponatremia- resolved.  6. Oral ulcers- per primary svc. 7. Metabolic acidosis- resolved. 8. SIRS- improving 9. Anemia- follow and transfuse prn 10. DM- per primary svc  Amanda Wilkins

## 2015-02-06 NOTE — Progress Notes (Signed)
[facility]                                                                              Patient Demographics  Amanda Wilkins, is a 69 y.o. female, DOB - 1946-06-26, KVQ:[id number]  Admit date - 01/22/2015   Admitting Physician Nelda Bucks, MD  Outpatient Primary MD for the patient is Sonda Primes, MD  LOS - 14   Chief Complaint  Patient presents with  . Irregular Heart Beat       Brief HPI   Patient is a 69 year old female with paroxysmal atrial fibrillation, hypertension, obesity presented with 2 day history of nausea, vomiting, generalized malaise, diarrhea, palpitations and fever. She had vomited several times and was unable to hold down her home medications. Lactic acid was 3.6 at the time of admission. CT chest showed inflammation in the right axilla extending inferiorly along the right chest but no focal evidence of abscess or gas. Patient was admitted to stepdown unit for further workup. BMET at the time of admission showed sodium of 127, creatinine 1.97, bicarbonate 16.   Assessment & Plan   Principal problem Septic shock due to Group A strep cellulitis, erysipelas / chest wall induration with Streptococcus bacteremia  - Improved The patient was admitted with acute viral gastroenteritis and lactic acidosis. CT chest showed inflammation in the right axilla extending inferiorly along the right chest, no focal evidence of abscess or gas. CT abdomen and pelvis showed no renal obstruction, gallbladder distention with possible gallstones. - Renal ultrasound showed no hydronephrosis, cholelithiasis however small left pleural effusion. Patient however became hypotensive, tachycardiac with respiratory distress and PCCM was consulted on 4/11. Patient required critical care support for vasopressors and was off the pressors on 4/13. HIDA scan showed a delayed filling of gallbladder, no evidence of biliary obstruction MRI of the flank showed no focal pockets  of infection/abscess. ID, Dr. Luciana Axe signed off on 4/18, recommended to continue IV ceftriaxone through 4/24. Patient had initially complained of some shoulder pain which has now resolved and per ID, no indication for MRI.  Active problems ?Viral Gastroenteritis - Now resolved, likely her initial GI symptoms were due to sepsis/severe cellulitis, bacteremia, patient is tolerating solid diet without any difficulty  Acute Kidney Injury - improving - Likely due to sepsis/septic shock, atrial fibrillation, hypotension, NSAIDs. - Nephrology following closely, HD catheter removed, Foley catheter removed  Persistent dependent Atelectasis  - Incentive spirometry, mobilization  Acute pulm edema  - medical management somewhat difficult due to acute kidney injury, nephrology following, per nephrology likely in the diuretic phase of recovery   Persistent Atrial Fibrillation  - Cardiology following, rate controlled, off of IV Cardizem drip  - CHADS2VASC 3 - cardiology recommending anticoagulation, not a candidate for NOAC due to renal failure, coumadin? - Placed on Coumadin, following hemoglobin  Anemia- normocytic, likely due to chronic disease - Stool occult test +, aspirin and heparin subcutaneous was discontinued. If hemoglobin trending down further tomorrow, will consult GI - Anemia panel consistent with anemia of chronic disease ferritin 689  Hypokalemia Resolved, DC oral replacement  HSV1 exacerbation  Appears to be resolving - acyclovir initiated  4/19   Hyponatremia  -Likely due to renal insufficiency, improving   Thrombocytopenia  Likely due to consumptive colopathy secondary to sepsis, resolved    Abnormal Myoview  Obtained in March 2016 -reviewed cardiology recommendations, currently no chest pain, cardiac enzymes negative. Defer further evaluation due to renal and additional significant comorbidities.  Obesity - Body mass index is 36.77 kg/(m^2).  -  patient counseled on diet  and weight control   High D-dimer  - doubt PE, CT angiogram not possible due to renal function,  Doppler lower extremity negative for DVT  - Cardiology starting on anticoagulation  Suspected OSA  Saturations remaining stable - will need outpatient sleep study   Code Status:Full code  Family Communication: Discussed in detail with the patient, all imaging results, lab results explained to the patient    Disposition Plan:  patient requesting for inpatient rehabilitation today, consult placed  Time Spent in minutes   25 minutes Procedures  CT Chest > inflammation in R axilla extending inferiorly along R chest, no focal evidence of abscess or gas, mild dependent atx CT Abd/Pelvis > no renal obs, Gallbladder distention with possible internal gallstone identified Renal US > no hydronephrosis, cholelithiasis, small L pleural effusion  LE Duplex > neg for DVT, RUE Duplex > neg for DVT  HIDA Scan > delayed filling of gallbladder, but the gallbladder fills after morphine administration, no evidence of biliary obstruction   Consults   Cardiology  ID Nephrology   DVT Prophylaxis heparin   Medications  Scheduled Meds: . acyclovir ointment  1 application Topical Q3H  . benzonatate  200 mg Oral TID  . bisoprolol  10 mg Oral BID  . cefTRIAXone (ROCEPHIN)  IV  2 g Intravenous Q24H  . chlorpheniramine-HYDROcodone  5 mL Oral Q12H  . diltiazem  240 mg Oral Daily  . estrogens (conjugated)  0.3 mg Oral Q48H  . famotidine  20 mg Oral QHS  . sertraline  50 mg Oral Daily  . valACYclovir  1,000 mg Oral Daily  . Warfarin - Pharmacist Dosing Inpatient   Does not apply q1800   Continuous Infusions:  PRN Meds:.acetaminophen **OR** acetaminophen, guaifenesin, levalbuterol, loperamide, magic mouthwash w/lidocaine, menthol-cetylpyridinium, ondansetron **OR** ondansetron (ZOFRAN) IV, sodium chloride, traMADol   Antibiotics   Anti-infectives    Start     Dose/Rate Route Frequency Ordered Stop    02/01/15 1200  valACYclovir (VALTREX) tablet 1,000 mg     1,000 mg Oral Daily 02/01/15 1120     01/26/15 1530  cefTRIAXone (ROCEPHIN) 2 g in dextrose 5 % 50 mL IVPB - Premix     2 g 100 mL/hr over 30 Minutes Intravenous Every 24 hours 01/26/15 1505 02/06/15 2359   01/25/15 1200  vancomycin (VANCOCIN) 1,500 mg in sodium chloride 0.9 % 500 mL IVPB  Status:  Discontinued     1,500 mg 250 mL/hr over 120 Minutes Intravenous Every 48 hours 01/23/15 1417 01/25/15 1015   01/25/15 1100  clindamycin (CLEOCIN) IVPB 900 mg  Status:  Discontinued     900 mg 100 mL/hr over 30 Minutes Intravenous 3 times per day 01/25/15 1014 01/30/15 1310   01/24/15 1600  doxycycline (VIBRAMYCIN) 100 mg in dextrose 5 % 250 mL IVPB  Status:  Discontinued     100 mg 125 mL/hr over 120 Minutes Intravenous Every 12 hours 01/24/15 1500 01/25/15 1013   01/24/15 1400  piperacillin-tazobactam (ZOSYN) IVPB 2.25 g  Status:  Discontinued     2.25 g 100 mL/hr over 30 Minutes  Intravenous 4 times per day 01/24/15 1148 01/26/15 1505   01/24/15 1000  vancomycin (VANCOCIN) IVPB 1000 mg/200 mL premix  Status:  Discontinued     1,000 mg 200 mL/hr over 60 Minutes Intravenous Every 24 hours 01/23/15 0846 01/23/15 1417   01/23/15 2200  piperacillin-tazobactam (ZOSYN) IVPB 3.375 g  Status:  Discontinued     3.375 g 12.5 mL/hr over 240 Minutes Intravenous Every 8 hours 01/23/15 0846 01/24/15 1148   01/23/15 1000  oseltamivir (TAMIFLU) capsule 30 mg  Status:  Discontinued     30 mg Oral 2 times daily 01/23/15 0803 01/23/15 1413   01/23/15 0900  vancomycin (VANCOCIN) 2,000 mg in sodium chloride 0.9 % 500 mL IVPB     2,000 mg 250 mL/hr over 120 Minutes Intravenous  Once 01/23/15 0846 01/23/15 1401   01/23/15 0845  piperacillin-tazobactam (ZOSYN) IVPB 3.375 g     3.375 g 100 mL/hr over 30 Minutes Intravenous  Once 01/23/15 0846 01/23/15 2013        Subjective:   Jettie Ferrence was seen and examined today.  Patient denies  dizziness, chest pain, abdominal pain, N/V/D/C, new weakness, numbess, tingling. feels a whole lot better today, still coughing, feels that she will be able to manage at home and if  needed she wants inpatient rehabilitation consult.    Objective:   Blood pressure 112/56, pulse 97, temperature 97.8 F (36.6 C), temperature source Oral, resp. rate 18, height 5\' 6"  (1.676 m), weight 104.2 kg (229 lb 11.5 oz), SpO2 99 %.  Wt Readings from Last 3 Encounters:  02/06/15 104.2 kg (229 lb 11.5 oz)  01/04/15 103.874 kg (229 lb)  12/16/14 102.513 kg (226 lb)     Intake/Output Summary (Last 24 hours) at 02/06/15 1146 Last data filed at 02/06/15 0436  Gross per 24 hour  Intake    410 ml  Output    950 ml  Net   -540 ml    Exam  General: Alert and oriented x 3, NAD  HEENT:  PERRLA, EOMI, Anicteic  Neck: Supple, no JVD  CVS: S1 S2 clear, RRR, no MRG  Respiratory: CTAB  Abdomen:  morbidly osoft, NT, ND, NBS  Ext: no cyanosis clubbing, 1+ edema  Neuro: AAOx3, Cr N's II- XII. Strength 5/5 upper and lower extremities bilaterally  Skin: No rashes  Psych: Normal affect and demeanor, alert and oriented x3    Data Review   Micro Results Recent Results (from the past 240 hour(s))  Clostridium Difficile by PCR     Status: None   Collection Time: 02/01/15  4:59 PM  Result Value Ref Range Status   C difficile by pcr NEGATIVE NEGATIVE Final    Radiology Reports Ct Abdomen Pelvis Wo Contrast  01/24/2015   CLINICAL DATA:  Abdominal pain, nausea, vomiting, diarrhea. Fever and chills. Sepsis.  EXAM: CT ABDOMEN AND PELVIS WITHOUT CONTRAST  TECHNIQUE: Multidetector CT imaging of the abdomen and pelvis was performed following the standard protocol without IV contrast.  COMPARISON:  No similar prior exam is available at this institution for comparison or on [facility].  FINDINGS: Lower chest: Small bilateral pleural effusions are noted. Interlobular septal thickening and bronchial wall  thickening are suggestive of interstitial pulmonary edema.  Hepatobiliary: Hepatic hypodensity suggests steatosis. No focal hepatic abnormality or intrahepatic ductal dilatation. Gallbladder distention with probable dependent stone image 34.  Pancreas: Normal  Spleen: Normal  Adrenals/Urinary Tract: Adrenal glands appear normal. Moderate nonspecific bilateral perinephric stranding is identified. No hydroureteronephrosis. A  Foley catheter is in place and the bladder is decompressed. No radiopaque renal or ureteral calculus. Renal parenchymal detail is obscured by lack of contrast.  Stomach/Bowel: Stomach appears normal. Normal appendix. No bowel wall thickening or focal segmental dilatation is identified.  Vascular/Lymphatic: No lymphadenopathy. Mild atheromatous aortic calcification identified.  Reproductive: Uterus and ovaries are unremarkable.  Other: Mild soft tissue anasarca is present which may suggest fluid overload. No free air or free intra-abdominal fluid.  Musculoskeletal: No acute osseous abnormality. L5-S1 disc degenerative change.  IMPRESSION: Findings at the lung bases suggest interstitial pulmonary edema with mild soft tissue anasarca.  Nonspecific bilateral perinephric stranding without hydroureteronephrosis or radiopaque renal or ureteral calculus.  Gallbladder distention with possible internal gallstone identified. This again raises the question of possible cholecystitis in the setting of sepsis.   Electronically Signed   By: Christiana Pellant M.D.   On: 01/24/2015 21:04   Dg Chest 2 View  01/22/2015   CLINICAL DATA:  Atrial fibrillation. Shortness of breath with exertion. Right axillary pain. No injury.  EXAM: CHEST  2 VIEW  COMPARISON:  09/22/2014  FINDINGS: Normal heart size and pulmonary vascularity. Perihilar interstitial changes suggesting chronic bronchitis. Linear atelectasis or scarring in the lung bases. No focal consolidation. No blunting of costophrenic angles. No pneumothorax.  Mediastinal contours appear intact. Calcified aorta. Mild degenerative changes in the spine.  IMPRESSION: Chronic bronchitic changes in the lungs with slight fibrosis or linear atelectasis in the lung bases. No focal consolidation.   Electronically Signed   By: Burman Nieves M.D.   On: 01/22/2015 23:46   Ct Chest Wo Contrast  01/23/2015   CLINICAL DATA:  69 year old female with right-sided chest and axillary pain. Sepsis. Fever for 1-2 weeks.  EXAM: CT CHEST WITHOUT CONTRAST  TECHNIQUE: Multidetector CT imaging of the chest was performed following the standard protocol without IV contrast.  COMPARISON:  01/22/2015 chest radiograph. Chest CT report dated 09/22/2014. Images are not available at this time due to PACS downtime.  FINDINGS: Mediastinum/Nodes: The heart and great vessels are unremarkable. There is no evidence of thoracic aortic aneurysm. No pleural or pericardial effusions are identified. No enlarged lymph nodes are noted.  Lungs/Pleura: Mild dependent and basilar atelectasis noted. There is no evidence of airspace disease, consolidation, suspicious nodule, mass or endobronchial/endotracheal lesion.  Upper abdomen: Hepatic steatosis identified.  Musculoskeletal: Right axillary inflammation is identified extending inferiorly along the right chest. There is no evidence of gas or abscess. No acute or suspicious bony abnormalities are identified.  IMPRESSION: Inflammation in the right axilla extending inferiorly along the right chest -suspicious for infection given clinical history. No evidence of focal abscess or gas.  Mild dependent/basilar atelectasis.   Electronically Signed   By: Harmon Pier M.D.   On: 01/23/2015 11:12   Mr Abdomen Wo Contrast  01/29/2015   CLINICAL DATA:  Septic shock. Evaluate subcutaneous soft tissue swelling/ edema involving the abdominal wall.  EXAM: MRI ABDOMEN WITHOUT CONTRAST  TECHNIQUE: Multiplanar multisequence MR imaging was performed without the administration of  intravenous contrast.  COMPARISON:  CT scan 01/24/2015  FINDINGS: There is diffuse edema like signal abnormality in the subcutaneous fat involving the entire abdominal wall more significantly on the right side. I do not see a discrete drainable fluid collection to suggest an abscess. No definite involvement of the abdominal muscles. There is however involvement of the latissimus dorsi muscle on the right side.  Intra-abdominal findings include bowel dilatation and cholelithiasis. No large intra-abdominal fluid collections are identified.  IMPRESSION: Diffuse subcutaneous soft tissue swelling/ edema/fluid likely reflecting cellulitis. There is also myositis involving the right latissimus dorsi muscle. No focal drainable fluid collection/abscess is identified.  Bowel dilatation is noted. No significant intra abdominal fluid collection is identified.  Gallbladder distention and cholelithiasis.   Electronically Signed   By: Rudie Meyer M.D.   On: 01/29/2015 12:56   Nm Hepatobiliary Including Gb  01/26/2015   CLINICAL DATA:  Gallstones.  Distended gallbladder on prior CT.  EXAM: NUCLEAR MEDICINE HEPATOBILIARY IMAGING  TECHNIQUE: Sequential images of the abdomen were obtained out to 60 minutes following intravenous administration of radiopharmaceutical.  RADIOPHARMACEUTICALS:  5.0 Millicurie Tc-69m Choletec  COMPARISON:  CT 01/24/2015  FINDINGS: Prompt uptake and excretion of radiotracer by the liver. Activity is seen in small bowel by 30 minutes. No activity noted in the gallbladder at 90 minutes. Therefore, 4 mg of morphine was administered intravenously. Continued imaging demonstrates filling of the gallbladder post morphine.  IMPRESSION: Delayed filling of the gallbladder, but the gallbladder fills after morphine administration compatible with patent cystic duct. No evidence of biliary obstruction.   Electronically Signed   By: Charlett Nose M.D.   On: 01/26/2015 14:28   [place] Chest  01/25/2015   CLINICAL DATA:   69 year old female with right axillary and chest wall pain. Fever and sepsis. Initial encounter.  EXAM: CHEST ULTRASOUND  COMPARISON:  Chest CT 01/23/2015.  FINDINGS: Ultrasound imaging in the area of clinical concern corresponding to the right axilla and chest wall.  Lymph nodes with normal fatty hila. Diffuse subcutaneous edema. Evidence of edematous chest wall muscle (image 19) with separation of muscle fibers, but no discrete or drainable fluid collection.  IMPRESSION: Subcutaneous and chest wall muscle edema with no discrete or drainable fluid collection. Regional lymph nodes remain normal.   Electronically Signed   By: Odessa Fleming M.D.   On: 01/25/2015 12:11   US Renal  01/24/2015   CLINICAL DATA:  Acute renal insufficiency.  EXAM: RENAL/URINARY TRACT ULTRASOUND COMPLETE  COMPARISON:  11/16/2009  FINDINGS: Right Kidney:  Length: 10.1 cm. Echogenicity within normal limits. No mass or hydronephrosis visualized.  Left Kidney:  Length: 10.4 cm. No hydronephrosis. Mild perinephric edema or trace fluid adjacent the lower pole left kidney.  Bladder:  Collapsed around a Foley catheter.  Exam degraded by patient body habitus. Incidental note is made of a 2.6 cm gallstone and a left-sided pleural effusion.  IMPRESSION: 1.  No hydronephrosis.  No explanation for renal insufficiency. 2. Decreased sensitivity and specificity exam due to technique related factors, as described above. 3. Cholelithiasis. 4. Small left pleural effusion.   Electronically Signed   By: Jeronimo Greaves M.D.   On: 01/24/2015 10:56   Dg Chest Port 1 View  01/28/2015   CLINICAL DATA:  Central line placement.  EXAM: PORTABLE CHEST - 1 VIEW  COMPARISON:  01/28/2015.  FINDINGS: The right IJ central venous catheter tip is in the proximal SVC above the level of the carina. No complicating features are demonstrated. Persistent bibasilar atelectasis. Possible small right effusion.  IMPRESSION: Right IJ central venous catheter tip is in the proximal SVC. No  complicating features.  Persistent bibasilar atelectasis and possible small right effusion.   Electronically Signed   By: Rudie Meyer M.D.   On: 01/28/2015 15:25   Dg Chest Port 1 View  01/28/2015   CLINICAL DATA:  Acute respiratory failure.  EXAM: PORTABLE CHEST - 1 VIEW  COMPARISON:  January 27, 2015.  FINDINGS: Stable cardiomediastinal silhouette.  No pneumothorax is noted. Left lung is clear. New right basilar opacity is noted concerning for pneumonia or atelectasis, with possible associated pleural effusion. Left-sided PICC line is noted with distal tip overlying expected position of the SVC.  IMPRESSION: New right basilar opacity is noted concerning for pneumonia or atelectasis with associated pleural effusion. Followup radiographs are recommended.   Electronically Signed   By: Lupita Raider, M.D.   On: 01/28/2015 08:26   Dg Chest Port 1 View  01/27/2015   CLINICAL DATA:  69 year old female with sepsis, septic shock. Initial encounter.  EXAM: PORTABLE CHEST - 1 VIEW  COMPARISON:  01/24/2015 and earlier.  FINDINGS: Portable AP upright view at 0802 hours. Stable left PICC line. Mildly improved lung volumes and bibasilar ventilation. Bilateral increased interstitial markings have regressed and now appear at baseline compared to 2015 Ing stands. No pneumothorax. No pleural effusion or consolidation. Stable cardiac size and mediastinal contours.  IMPRESSION: Regressed bilateral interstitial opacity, lung parenchyma now all appears at baseline compared to 2015 studies. No new cardiopulmonary abnormality.   Electronically Signed   By: Odessa Fleming M.D.   On: 01/27/2015 08:08   Dg Chest Port 1 View  01/24/2015   CLINICAL DATA:  Wheezing and shortness of breath.  EXAM: PORTABLE CHEST - 1 VIEW  COMPARISON:  01/24/2015  FINDINGS: Left-sided PICC line tip overlies the superior vena cava. Heart is mildly prominent. There Kerley B-lines consistent with pulmonary edema. No focal consolidation. No overt alveolar edema.   IMPRESSION: 1. Mild interstitial edema. 2. Left-sided PICC line tip to the superior vena cava.   Electronically Signed   By: Norva Pavlov M.D.   On: 01/24/2015 22:14   Dg Chest Port 1 View  01/24/2015   CLINICAL DATA:  Hypoxia, pt was admitted with an acute URI and right armpit infection and was found to be in a-fib with RVR, hypovolemic, hyponatremic with an acute renal failure.  EXAM: PORTABLE CHEST - 1 VIEW  COMPARISON:  Radiograph 01/23/2015  FINDINGS: Left-sided PICC line with tip in distal SVC. Stable cardiac silhouette. There is mild central venous congestion. No focal infiltrate. No pulmonary edema. Mild basilar atelectasis.  IMPRESSION: Mild basilar atelectasis and minimal central venous congestion. No change from prior.   Electronically Signed   By: Genevive Bi M.D.   On: 01/24/2015 09:44   Dg Chest Port 1 View  01/23/2015   CLINICAL DATA:  Shortness of breath.  EXAM: PORTABLE CHEST - 1 VIEW  COMPARISON:  Same day.  FINDINGS: The heart size and mediastinal contours are within normal limits. Both lungs are clear. No pneumothorax or pleural effusion is noted. Left-sided PICC line is noted with tip in expected position of cavoatrial junction. The visualized skeletal structures are unremarkable.  IMPRESSION: No acute cardiopulmonary abnormality seen.   Electronically Signed   By: Lupita Raider, M.D.   On: 01/23/2015 18:52   Dg Chest Port 1 View  01/23/2015   CLINICAL DATA:  PICC line placement  EXAM: PORTABLE CHEST - 1 VIEW  COMPARISON:  CT scan of the chest same day  FINDINGS: Cardiomediastinal silhouette is unremarkable. No acute infiltrate or pulmonary edema. There is a left arm PICC line with tip in right atrium. For distal SVC position the PICC line should be retracted about 1 cm. No pneumothorax.  IMPRESSION: Left arm PICC line with tip in right atrium. For distal SVC position PICC line should be retracted about 1 cm. No pneumothorax.   Electronically Signed  By: Natasha Mead M.D.    On: 01/23/2015 17:45   US Abdomen Limited Ruq  01/25/2015   CLINICAL DATA:  Upper abdominal pain  EXAM: US ABDOMEN LIMITED - RIGHT UPPER QUADRANT  COMPARISON:  None.  FINDINGS: Gallbladder:  There is a a focal gallstone measuring 1.9 cm in length which moves and shadows. Gallbladder wall is not thickened, and there is no pericholecystic fluid. No sonographic Murphy sign noted.  Common bile duct:  Diameter: 5 mm. No intrahepatic or extrahepatic biliary duct dilatation.  Liver:  No focal lesion identified. Liver echogenicity is diffusely increased.  There is a small right pleural effusion.  IMPRESSION: Cholelithiasis. Increased liver echogenicity, most likely due to hepatic steatosis. While no focal liver lesions are identified, it must be cautioned that the sensitivity of ultrasound for focal liver lesions is diminished in this circumstance. Small right pleural effusion noted.   Electronically Signed   By: Bretta Bang III M.D.   On: 01/25/2015 12:15    CBC  Recent Labs Lab 02/02/15 0230 02/03/15 0456 02/04/15 0440 02/05/15 0440 02/06/15 0603  WBC 11.3* 9.9 8.4 7.5 6.9  HGB 9.3* 9.4* 9.1* 8.8* 8.4*  HCT 26.6* 27.0* 26.2* 25.6* 25.1*  PLT 211 257 264 268 240  MCV 96.0 95.4 97.0 97.7 99.6  MCH 33.6 33.2 33.7 33.6 33.3  MCHC 35.0 34.8 34.7 34.4 33.5  RDW 13.8 13.8 14.0 14.0 14.2  LYMPHSABS 1.8  --   --   --   --   MONOABS 0.5  --   --   --   --   EOSABS 0.1  --   --   --   --   BASOSABS 0.0  --   --   --   --     Chemistries   Recent Labs Lab 02/02/15 0230 02/03/15 0440 02/04/15 0440 02/05/15 0440 02/06/15 0603  NA 130* 132* 133* 135 135  K 3.0* 2.9* 3.3* 3.6 4.0  CL 91* 93* 94* 97 99  CO2 27 28 27 25 25   GLUCOSE 110* 114* 109* 108* 109*  BUN 37* 39* 39* 40* 37*  CREATININE 3.93* 3.87* 3.77* 3.65* 3.43*  CALCIUM 8.1* 8.2* 8.2* 8.6 8.6  MG 1.6  --   --   --   --   AST 23  --   --   --   --   ALT 23  --   --   --   --   ALKPHOS 57  --   --   --   --   BILITOT 0.5  --    --   --   --    ------------------------------------------------------------------------------------------------------------------ estimated creatinine clearance is 19.2 mL/min (by C-G formula based on Cr of 3.43). ------------------------------------------------------------------------------------------------------------------ No results for input(s): HGBA1C in the last 72 hours. ------------------------------------------------------------------------------------------------------------------ No results for input(s): CHOL, HDL, LDLCALC, TRIG, CHOLHDL, LDLDIRECT in the last 72 hours. ------------------------------------------------------------------------------------------------------------------ No results for input(s): TSH, T4TOTAL, T3FREE, THYROIDAB in the last 72 hours.  Invalid input(s): FREET3 ------------------------------------------------------------------------------------------------------------------  Recent Labs  02/04/15 1410  VITAMINB12 1096*  FOLATE 8.0  FERRITIN 689*  TIBC 234*  IRON 22*  RETICCTPCT 2.0    Coagulation profile  Recent Labs Lab 02/05/15 0440 02/06/15 0603  INR 1.00 1.29    No results for input(s): DDIMER in the last 72 hours.  Cardiac Enzymes No results for input(s): CKMB, TROPONINI, MYOGLOBIN in the last 168 hours.  Invalid input(s): CK ------------------------------------------------------------------------------------------------------------------ Invalid input(s): POCBNP  No results for input(s): GLUCAP  in the last 72 hours.   Victorious Kundinger M.D. [facility] 02/06/2015, 11:46 AM  Pager: [phone]   Between 7am to 7pm - call Pager - [phone]  After 7pm go to [email or web address] - password [email or web address]  Call night coverage person covering after 7pm

## 2015-02-06 NOTE — Progress Notes (Signed)
Subjective:  Complained of difficulty sleeping last night due to rapid heartbeat and feeling uncomfortable.  No shortness of breath today.  Sitting up at bedside.  Renal function slowly improving.  Objective:  Vital Signs in the last 24 hours: BP 112/56 mmHg  Pulse 97  Temp(Src) 97.8 F (36.6 C) (Oral)  Resp 18  Ht 5\' 6"  (1.676 m)  Wt 104.2 kg (229 lb 11.5 oz)  BMI 37.10 kg/m2  SpO2 99%  Physical Exam: Obese female in no acute distress Lungs:  Clear  Cardiac:  irregular rhythm, normal S1 and S2, no S3 Abdomen:  Soft, nontender, no masses Extremities:  No edema present  Intake/Output from previous day: 04/23 0701 - 04/24 0700 In: 730 [P.O.:680; IV Piggyback:50] Out: 950 [Urine:950] Weight Filed Weights   02/04/15 0531 02/05/15 0524 02/06/15 0435  Weight: 107.5 kg (236 lb 15.9 oz) 105 kg (231 lb 7.7 oz) 104.2 kg (229 lb 11.5 oz)    Lab Results: Basic Metabolic Panel:  Recent Labs  02/05/15 0440 02/06/15 0603  NA 135 135  K 3.6 4.0  CL 97 99  CO2 25 25  GLUCOSE 108* 109*  BUN 40* 37*  CREATININE 3.65* 3.43*    CBC:  Recent Labs  02/05/15 0440 02/06/15 0603  WBC 7.5 6.9  HGB 8.8* 8.4*  HCT 25.6* 25.1*  MCV 97.7 99.6  PLT 268 240     PROTIME: Lab Results  Component Value Date   INR 1.29 02/06/2015   INR 1.00 02/05/2015   INR 1.18 01/24/2015    Telemetry: Atrial fibrillation with somewhat rapid response today  Assessment/Plan:  1.  Persistent atrial fibrillation, rate still increased.   2.  Acute on chronic kidney disease 3.  History of sepsis 4.  Diabetes mellitus  Recommendations:  Her A. fib rate continues to be increased.  Zebeta was increased yesterday.  Increase diltiazem to 180 mg twice daily.      Kerry Hough  MD [facility]  02/06/2015, 12:39 PM

## 2015-02-06 NOTE — Clinical Social Work Note (Signed)
Patient states that she wants to go to the inpatient rehab. She wants to go to [facility] if [facility] does not accept her. CSW will leave report for weekday CSW.   Liz Beach MSW, [place], [place], [phone]

## 2015-02-07 ENCOUNTER — Telehealth: Payer: Self-pay | Admitting: Internal Medicine

## 2015-02-07 DIAGNOSIS — D649 Anemia, unspecified: Secondary | ICD-10-CM

## 2015-02-07 DIAGNOSIS — D6489 Other specified anemias: Secondary | ICD-10-CM

## 2015-02-07 LAB — BASIC METABOLIC PANEL
ANION GAP: 11 (ref 5–15)
BUN: 36 mg/dL — ABNORMAL HIGH (ref 6–23)
CHLORIDE: 102 mmol/L (ref 96–112)
CO2: 23 mmol/L (ref 19–32)
CREATININE: 3.23 mg/dL — AB (ref 0.50–1.10)
Calcium: 9 mg/dL (ref 8.4–10.5)
GFR calc non Af Amer: 14 mL/min — ABNORMAL LOW (ref >90)
GFR, EST AFRICAN AMERICAN: 16 mL/min — AB (ref >90)
Glucose, Bld: 129 mg/dL — ABNORMAL HIGH (ref 70–99)
POTASSIUM: 4 mmol/L (ref 3.5–5.1)
SODIUM: 136 mmol/L (ref 135–145)

## 2015-02-07 LAB — CBC
HCT: 24.6 % — ABNORMAL LOW (ref 36.0–46.0)
Hemoglobin: 8.3 g/dL — ABNORMAL LOW (ref 12.0–15.0)
MCH: 33.6 pg (ref 26.0–34.0)
MCHC: 33.7 g/dL (ref 30.0–36.0)
MCV: 99.6 fL (ref 78.0–100.0)
Platelets: 234 10*3/uL (ref 150–400)
RBC: 2.47 MIL/uL — ABNORMAL LOW (ref 3.87–5.11)
RDW: 14.1 % (ref 11.5–15.5)
WBC: 5.9 10*3/uL (ref 4.0–10.5)

## 2015-02-07 LAB — PROTIME-INR
INR: 1.34 (ref 0.00–1.49)
Prothrombin Time: 16.7 seconds — ABNORMAL HIGH (ref 11.6–15.2)

## 2015-02-07 MED ORDER — LOPERAMIDE HCL 2 MG PO CAPS: 2.0000 mg | ORAL_CAPSULE | Freq: Three times a day (TID) | ORAL | Status: DC | PRN

## 2015-02-07 MED ORDER — SODIUM CHLORIDE 0.9 % IV SOLN
INTRAVENOUS | Status: DC
  Administered 2015-02-07: 17:00:00 via INTRAVENOUS

## 2015-02-07 MED ORDER — MENTHOL 3 MG MT LOZG
1.0000 | LOZENGE | OROMUCOSAL | Status: DC | PRN
Start: 2015-02-07 — End: 2015-02-10

## 2015-02-07 MED ORDER — SERTRALINE HCL 50 MG PO TABS: 50.0000 mg | ORAL_TABLET | Freq: Every day | ORAL | Status: DC

## 2015-02-07 MED ORDER — HYDROCOD POLST-CPM POLST ER 10-8 MG/5ML PO SUER: 5.0000 mL | Freq: Two times a day (BID) | ORAL | Status: DC | PRN

## 2015-02-07 MED ORDER — ALTEPLASE 2 MG IJ SOLR
2.0000 mg | Freq: Once | INTRAMUSCULAR | Status: AC
  Administered 2015-02-07: 2 mg
  Filled 2015-02-07: qty 2

## 2015-02-07 MED ORDER — ACYCLOVIR 5 % EX OINT: 1.0000 "application " | TOPICAL_OINTMENT | CUTANEOUS | Status: DC

## 2015-02-07 MED ORDER — TRAMADOL HCL 50 MG PO TABS: 50.0000 mg | ORAL_TABLET | Freq: Two times a day (BID) | ORAL | Status: DC | PRN

## 2015-02-07 MED ORDER — DILTIAZEM HCL ER COATED BEADS 180 MG PO CP24: 180.0000 mg | ORAL_CAPSULE | Freq: Two times a day (BID) | ORAL | Status: DC

## 2015-02-07 MED ORDER — ALTEPLASE 2 MG IJ SOLR
2.0000 mg | Freq: Once | INTRAMUSCULAR | Status: AC
Start: 2015-02-07 — End: 2015-02-07
  Administered 2015-02-07: 2 mg
  Filled 2015-02-07: qty 2

## 2015-02-07 MED ORDER — MAGIC MOUTHWASH W/LIDOCAINE: 5.0000 mL | Freq: Three times a day (TID) | ORAL | Status: DC | PRN

## 2015-02-07 MED ORDER — BENZONATATE 200 MG PO CAPS: 200.0000 mg | ORAL_CAPSULE | Freq: Three times a day (TID) | ORAL | Status: DC

## 2015-02-07 MED ORDER — VALACYCLOVIR HCL 1 G PO TABS: 1000.0000 mg | ORAL_TABLET | Freq: Every day | ORAL | Status: DC

## 2015-02-07 MED ORDER — BISOPROLOL FUMARATE 10 MG PO TABS: 10.0000 mg | ORAL_TABLET | Freq: Two times a day (BID) | ORAL | Status: DC

## 2015-02-07 NOTE — Progress Notes (Signed)
Subjective:  Pt tearful this am. Concerned about positive stool and anemia.   Objective:  Vital Signs in the last 24 hours: Temp:  [97.7 F (36.5 C)-98.5 F (36.9 C)] 97.7 F (36.5 C) (04/25 0405) Pulse Rate:  [67-115] 67 (04/25 0405) Resp:  [17-18] 18 (04/25 0405) BP: (112-129)/(69-88) 112/83 mmHg (04/25 0405) SpO2:  [95 %-100 %] 100 % (04/25 0405) Weight:  [230 lb 13.2 oz (104.7 kg)] 230 lb 13.2 oz (104.7 kg) (04/25 0346)  Intake/Output from previous day:  Intake/Output Summary (Last 24 hours) at 02/07/15 1022 Last data filed at 02/06/15 1554  Gross per 24 hour  Intake    240 ml  Output    600 ml  Net   -360 ml    Physical Exam: General appearance: alert, cooperative, no distress and moderately obese Lungs: clear to auscultation bilaterally Heart: irregularly irregular rhythm Abdomen: obese, dressing Rt abdominal wall Neurologic: Grossly normal   Rate: 100-140  Rhythm: atrial fibrillation  Lab Results:  CBC Latest Ref Rng 02/07/2015 02/06/2015 02/05/2015  WBC 4.0 - 10.5 K/uL 5.9 6.9 7.5  Hemoglobin 12.0 - 15.0 g/dL 8.3(L) 8.4(L) 8.8(L)  Hematocrit 36.0 - 46.0 % 24.6(L) 25.1(L) 25.6(L)  Platelets 150 - 400 K/uL 234 240 268     Recent Labs  02/05/15 0440 02/06/15 0603  NA 135 135  K 3.6 4.0  CL 97 99  CO2 25 25  GLUCOSE 108* 109*  BUN 40* 37*  CREATININE 3.65* 3.43*    Recent Labs  02/07/15 0550  INR 1.34    Scheduled Meds: . acyclovir ointment  1 application Topical I3J  . benzonatate  200 mg Oral TID  . bisoprolol  10 mg Oral BID  . chlorpheniramine-HYDROcodone  5 mL Oral Q12H  . diltiazem  180 mg Oral BID  . estrogens (conjugated)  0.3 mg Oral Q48H  . famotidine  20 mg Oral QHS  . sertraline  50 mg Oral Daily  . valACYclovir  1,000 mg Oral Daily     Imaging: Imaging results have been reviewed  Cardiac Studies: Echo 01/25/15 Study Conclusions  - Left ventricle: The cavity size was normal. Wall thickness was normal. Systolic  function was normal. The estimated ejection fraction was in the range of 55% to 60%. Wall motion was normal; there were no regional wall motion abnormalities. - Mitral valve: Calcified annulus. There was mild regurgitation. - Left atrium: The atrium was mildly dilated. - Pulmonary arteries: Systolic pressure was mildly increased. PA peak pressure: 32 mm Hg (S).  Impressions:  - Normal LV function; mild LAE; mild MR and TR; mildly elevated pulmonary pressure.   Assessment/Plan:  1. Paroxysmal atrial fibrillation. She is in AF with VR 100-140 on Zebeta 10 mg and Diltiazem 180 mg.She has had an occasional 2 sec pause.   2. Septic shock due to Group A strep cellulitis, erysipelas / chest wall induration with Streptococcus bacteremia  3. Acute renal failure. Transient HD this admission. SCr continues to slowly improve.   4. Anemia with positive stool- currently not on anticoagulants or ASA.     PLAN: Will review with MD-pt is [place] 3 for age, sex, HTN. No NOAC secondary to renal insufficiency and now anemia with heme positive stools. I believe primary service is going to contact GI. Transfer to rehab is on hold till this addressed.  Consider increasing Diltiazem to ? 180 mg BID.   Kerin Ransom PA-C 02/07/2015, 10:22 AM   Patient seen and examined. Agree with assessment  and plan. No chest pain. Persistent anemia with +  stool for occult heme. For EGD per GI ? Later today or tomorrow.  Agree with changing Diltiazem CD to 180 mg bid.    Troy Sine, MD, [facility] 02/07/2015 12:59 PM

## 2015-02-07 NOTE — Progress Notes (Signed)
Medicare Important Message given? YES  (If response is "NO", the following Medicare IM given date fields will be blank)  Date Medicare IM given: 02/07/15 Medicare IM given by:  Pessy Delamar  

## 2015-02-07 NOTE — Progress Notes (Signed)
[facility] Consult: 10:07 AM 02/07/2015  LOS: 15 days    Referring Provider: Dr Tana Coast  Primary Care Physician:  [name], MD Primary Gastroenterologist:  Dr. Olevia Perches for 2012 screening colonoscopy.     Reason for Consultation:  FOBT+ anemia.    HPI: Amanda Wilkins is a 69 y.o. female. PMH: PAF (on ASA only PTA), obesity, htn, suspected OSA.   01/2011 Colonoscopy, screening study.  Normal study: no polyps, no tics.  Admitted with sepsis from group A strep cellulitis, erisipelas, chest wall/right axillary induration, HSV 1 exacerbation. .  Presented with fever, body aches, diarrhea, N/V. Treated for rapid a fib (rate stil in 1teens) and started on [facility].  AKI with transient need for HD. Pulmonary edema.  Once ready, she will go to [facility].  She has developed anemia during the admission.  Baseline hgb 13.5. 11.8 after initial hydration.  Has drifted steadily through 10, then 9s, then 8s and today it is 8.3.  FOBT is + so Warfarin discontinued 4/23. Heparin discontinued on 4/22.  Patient denies any current or previous history of melena or bleeding per rectum.  At the time of admission she was taking daily Celebrex as well as full dose aspirin. She had no gastric or intestinal symptoms and was not using any GI protective medication. Bowel movements generally are daily.  No dysphagia. Has never undergone upper endoscopy. Patient does express upset that physicians had not advised her that her blood counts were steadily declining and that this information was only shared with her today when she thought she was can go to rehabilitation today.  01/24/15 CT abdomen showed "Gallbladder distention with possible internal gallstone identified... again raises the question of possible cholecystitis in the setting of  sepsis." also showed non-specific bil peri-nehric stranding without stone or hydro, soft tissue anasarca, pulmonary edema.  01/26/2015 MR abdomen: Diffuse subcutaneous soft tissue swelling/ edema/fluid likely reflecting cellulitis. There is also myositis involving the right latissimus dorsi muscle. No focal drainable fluid collection/abscess is identified. Bowel dilatation is noted. No significant intra abdominal fluid collection is identified.  Gallbladder distention and cholelithiasis. 01/26/2015 HIDA: Delayed filling of the gallbladder, but the gallbladder fills after morphine administration compatible with patent cystic duct. No evidence of biliary obstruction.   Past Medical History  Diagnosis Date  . Allergy   . Atrial fibrillation   . Migraines     on Zoloft for migraines  . Hypertension   . Chronic interstitial nephritis   . Depression   . Palpitations   . Obesity   . Arthritis     ON BOTH KNEES    Past Surgical History  Procedure Laterality Date  . Cesarean section    . Dental surgery      multiple implants  . Repair of torn meniscus on left 08/2014 Left 08/2015    Dr. Ronnie Derby at [facility]    Prior to Admission medications   Medication Sig Start Date End Date Taking? Authorizing Provider  aspirin EC 325 MG tablet Take 325 mg by mouth at bedtime.  Yes Historical Provider, MD  atenolol (TENORMIN) 50 MG tablet TAKE 1 TABLET BY MOUTH DAILY Patient taking differently: TAKE 1 TABLET BY MOUTH DAILY AT BEDTIME 03/22/14  Yes Aleksei Plotnikov V, MD  Cholecalciferol (VITAMIN D) 2000 UNITS tablet Take 2,000 Units by mouth daily.   Yes Historical Provider, MD  DILT-XR 180 MG 24 hr capsule TAKE 1 CAPSULE BY MOUTH DAILY 08/05/14  Yes Aleksei Plotnikov V, MD  estrogens, conjugated, (PREMARIN) 0.3 MG tablet Take daily Patient taking differently: Take 0.15 mg by mouth at bedtime. Tapering down:  Take 1/2 tablet (0.15 mg) daily until the end of May, then stop 01/19/14  Yes Terrance Mass, MD    glycopyrrolate (ROBINUL) 1 MG tablet Take 1-2 mg by mouth 2 (two) times daily. Take 2 tablets (2 mg) every morning and 1 tablet (1 mg) every night   Yes Historical Provider, MD  medroxyPROGESTERone (PROVERA) 10 MG tablet TAKE ONE TABLET 10 DAYS OF THE MONTH Patient taking differently: Take 10 mg by mouth See admin instructions. Take 1 tablet (10 mg) on the 1st thru 10th of each month. Will stop after the 10th of May 2016 01/19/14  Yes Terrance Mass, MD  mupirocin ointment (BACTROBAN) 2 % Applied twice a day to the affected area;NOT into eyes. Patient taking differently: Apply 1 application topically 2 (two) times daily as needed (ear drainage/swelling). Do not use in eyes 01/04/15  Yes Hendricks Limes, MD  nitrofurantoin, macrocrystal-monohydrate, (MACROBID) 100 MG capsule Take one twice a day for one week then take one after intercourse Patient taking differently: Take 100 mg by mouth once as needed (after intercourse to prevent UTI).  12/06/14  Yes Terrance Mass, MD  acyclovir ointment (ZOVIRAX) 5 % Apply 1 application topically every 3 (three) hours. Apply to lips for 10days or until healed 02/07/15   Ripudeep K Rai, MD  Alum & Mag Hydroxide-Simeth (MAGIC MOUTHWASH W/LIDOCAINE) SOLN Take 5 mLs by mouth 3 (three) times daily as needed for mouth pain. 02/07/15   Ripudeep Krystal Eaton, MD  benzonatate (TESSALON) 200 MG capsule Take 1 capsule (200 mg total) by mouth 3 (three) times daily. 02/07/15   Ripudeep Krystal Eaton, MD  bisoprolol (ZEBETA) 10 MG tablet Take 1 tablet (10 mg total) by mouth 2 (two) times daily. 02/07/15   Ripudeep Krystal Eaton, MD  celecoxib (CELEBREX) 200 MG capsule Take 200 mg by mouth daily.    Historical Provider, MD  chlorpheniramine-HYDROcodone (TUSSIONEX) 10-8 MG/5ML SUER Take 5 mLs by mouth every 12 (twelve) hours as needed for cough. 02/07/15   Ripudeep Krystal Eaton, MD  clarithromycin (BIAXIN XL) 500 MG 24 hr tablet Take 2 tablets (1,000 mg total) by mouth daily. Patient not taking: Reported on  01/23/2015 01/04/15   Hendricks Limes, MD  diltiazem (CARDIZEM CD) 180 MG 24 hr capsule Take 1 capsule (180 mg total) by mouth 2 (two) times daily. 02/07/15   Ripudeep Krystal Eaton, MD  loperamide (IMODIUM) 2 MG capsule Take 1 capsule (2 mg total) by mouth 3 (three) times daily as needed for diarrhea or loose stools. 02/07/15   Ripudeep Krystal Eaton, MD  menthol-cetylpyridinium (CEPACOL) 3 MG lozenge Take 1 lozenge (3 mg total) by mouth as needed for sore throat. 02/07/15   Ripudeep Krystal Eaton, MD  sertraline (ZOLOFT) 50 MG tablet Take 1 tablet (50 mg total) by mouth daily. 02/07/15   Ripudeep Krystal Eaton, MD  traMADol (ULTRAM) 50 MG tablet Take 1 tablet (50 mg total) by mouth every  12 (twelve) hours as needed for moderate pain. 02/07/15   Ripudeep Krystal Eaton, MD  valACYclovir (VALTREX) 1000 MG tablet Take 1 tablet (1,000 mg total) by mouth daily. X 14days 02/07/15   Ripudeep Krystal Eaton, MD    Scheduled Meds: . acyclovir ointment  1 application Topical Z8H  . benzonatate  200 mg Oral TID  . bisoprolol  10 mg Oral BID  . chlorpheniramine-HYDROcodone  5 mL Oral Q12H  . diltiazem  180 mg Oral BID  . estrogens (conjugated)  0.3 mg Oral Q48H  . famotidine  20 mg Oral QHS  . sertraline  50 mg Oral Daily  . valACYclovir  1,000 mg Oral Daily   Infusions:   PRN Meds: acetaminophen **OR** acetaminophen, guaifenesin, levalbuterol, loperamide, magic mouthwash w/lidocaine, menthol-cetylpyridinium, ondansetron **OR** ondansetron (ZOFRAN) IV, sodium chloride, traMADol   Allergies as of 01/22/2015 - Review Complete 01/22/2015  Allergen Reaction Noted  . Epinephrine Other (See Comments) 01/08/2011  . Aspirin  06/13/2011  . Epinephrine base Other (See Comments) 07/12/2014  . Penicillins Other (See Comments)     Family History  Problem Relation Age of Onset  . Cancer Brother     PANCREATIC    History   Social History  . Marital Status: Married    Spouse Name: N/A  . Number of Children: N/A  . Years of Education: N/A    Occupational History  . retired   . Part-time (with temp agency)    Social History Main Topics  . Smoking status: Never Smoker   . Smokeless tobacco: Never Used     Comment: H/O social smoking at times when drinking--never a "habit"  . Alcohol Use: 8.4 oz/week    14 Glasses of wine per week     Comment: WINE- TWO GLASSES AT DINNER  . Drug Use: No  . Sexual Activity: Yes   Other Topics Concern  . Not on file   Social History Narrative    REVIEW OF SYSTEMS: Constitutional:  Weight is stable. Ambulating without a walker. ENT:  No nose bleeds Pulm:  No cough or dyspnea CV:  No palpitations, no LE edema. No chest pain GU:  No hematuria, no frequency GI:  No dysphagia. Per HPI Breast: normal mammogram 01/2013.  Heme:  Never had issues with anemia in past.   Transfusions:  None ever. Neuro:  No headaches, no peripheral tingling or numbness Derm:  No itching, no rash or sores.  Endocrine:  No sweats or chills.  No polyuria or dysuria Immunization:  Flu shot up-to-date.  Zoster vaccine in 2012. Pneumovax in 2015 Travel:  None beyond local counties in last few months.    PHYSICAL EXAM: Vital signs in last 24 hours: Filed Vitals:   02/07/15 0405  BP: 112/83  Pulse: 67  Temp: 97.7 F (36.5 C)  Resp: 18   Wt Readings from Last 3 Encounters:  02/07/15 230 lb 13.2 oz (104.7 kg)  01/04/15 229 lb (103.874 kg)  12/16/14 226 lb (102.513 kg)   General: Obese, pale, comfortable, somewhat ill appearing WF. Head:  No facial asymmetry, swelling. No signs of head trauma.  There is some healing scabbed lesions on the right side of her face.  Eyes:  No scleral icterus or conjunctival pallor. EOMI. Ears:  No hearing deficit  Nose:  No congestion or discharge. Mouth:  Oral cavity with moist, clear mucous membranes. Dentition in good repair. Neck:  No mass, no JVD. Lungs:  Clear to auscultation bilaterally. No cough or labored breathing.  Heart: Irregularly irregular rhythm. No  murmurs rubs or gallops. Abdomen:  Lots of bruises on the abdomen from where she was getting heparin injections. Right sided abdominal rash/wound covered with dressing, was lifted for exam and there is no blood at the site..   Rectal: Stool is soft, medium brown and 1+ FOBT positive. No masses or hemorrhoids palpated or seen.  Scars on left knee consistent with her arthroscopic surgery. Musc/Skeltl: No joint contractures, erythema or other gross deformities.   Extremities:  Non-pitting pedal edema which is slight.  Neurologic:  Alert, oriented 3. No tremor. No weakness of the limbs. Grossly no neurologic deficits. Skin:  Rash seen on the abdomen as well as the right flank and lower sacral areas. Tattoos:  None Nodes:  No cervical adenopathy.   Psych:  Cooperative, pleasant, relaxed.  Intake/Output from previous day: 04/24 0701 - 04/25 0700 In: 560 [P.O.:560] Out: 600 [Urine:600] Intake/Output this shift:    LAB RESULTS:  Recent Labs  02/05/15 0440 02/06/15 0603 02/07/15 0550  WBC 7.5 6.9 5.9  HGB 8.8* 8.4* 8.3*  HCT 25.6* 25.1* 24.6*  PLT 268 240 234  MCV                                          99  BMET Lab Results  Component Value Date   NA 135 02/06/2015   NA 135 02/05/2015   NA 133* 02/04/2015   K 4.0 02/06/2015   K 3.6 02/05/2015   K 3.3* 02/04/2015   CL 99 02/06/2015   CL 97 02/05/2015   CL 94* 02/04/2015   CO2 25 02/06/2015   CO2 25 02/05/2015   CO2 27 02/04/2015   GLUCOSE 109* 02/06/2015   GLUCOSE 108* 02/05/2015   GLUCOSE 109* 02/04/2015   BUN 37* 02/06/2015   BUN 40* 02/05/2015   BUN 39* 02/04/2015   CREATININE 3.43* 02/06/2015   CREATININE 3.65* 02/05/2015   CREATININE 3.77* 02/04/2015   CALCIUM 8.6 02/06/2015   CALCIUM 8.6 02/05/2015   CALCIUM 8.2* 02/04/2015   LFT No results for input(s): PROT, ALBUMIN, AST, ALT, ALKPHOS, BILITOT, BILIDIR, IBILI in the last 72 hours. PT/INR Lab Results  Component Value Date   INR 1.34 02/07/2015   INR  1.29 02/06/2015   INR 1.00 02/05/2015    RADIOLOGY STUDIES: Per HPI.    ENDOSCOPIC STUDIES: Per HPI.   IMPRESSION:   *  FOBT + anemia.  Rule out gastritis, NSAID-induced ulcers, AVMs. However no GI symptoms to support any evidence for acid peptic disease. Had normal screening colonoscopy 01/2011.   *  Cellulitis with group A, sepsis.  Resolving.  *   Rapid Afib, previous history paroxysmal atrial fibrillation treated with aspirin alone.   As inpatient she was on heparin but had recently been transitioned to Coumadin, both heparin and Coumadin as well as aspirin discontinued. are now discontinued. .  Weight still somewhat tachycardic.  *  AKI.      PLAN:     *  Likely needs EGD.  Pt is reluctant for this due to need for sedation but is likely to consent.  Had solid AM meal today but currently NPO.  Currently EGD for tomorrow at 12:30 PM.     Azucena Freed  02/07/2015, 10:07 AM Pager: [phone]

## 2015-02-07 NOTE — Progress Notes (Signed)
Thank you for consult on [name].  Chart reviewed and note that patient is doing well with therapy but limited by endurance issues.  She has had multiple SNF bed offer per notes. Concur with SNF for follow up therapy to help with endurance issues.

## 2015-02-07 NOTE — Telephone Encounter (Signed)
New Message    Patient would like to speak to someone in regards to her medication. She is currently in hospital and feels like she needs to speak to someone about the medication that they are giving her.  Please give patient a call back.

## 2015-02-07 NOTE — Progress Notes (Signed)
Occupational Therapy Treatment Patient Details Name: Amanda Wilkins MRN: [id number] DOB: 07-Dec-1945 Today's Date: 02/07/2015    History of present illness Adm 01/22/15 with n/v, general malaise, diarrhea, fever, and Rt flank pain (which turned into Group A Strep infection); sepsis with hypotension, afib with RVR; 4/15 fluid overload with plans for HD cath/HD  PMHx-afib, OA, obesity   OT comments  Pt demonstrated modified independence with toileting and standing activities at the sink and reports routinely bathing and dressing herself with set up now.  HR 92-99 with ambulation in room. Recommended pt have supervision with walking in halls. Educated pt in energy conservation strategies and recommended a shower seat and hand held shower head with flow control on handle for home. Pt and her family are in agreement for home health PT/OT.  Family is able to provide 24 hour supervision as needed.  Follow Up Recommendations  Home health OT    Equipment Recommendations  None recommended by OT (family will purchase shower seat on their own)    Recommendations for Other Services      Precautions / Restrictions Precautions Precautions:  (contact) Precaution Comments: contact Restrictions Weight Bearing Restrictions: No       Mobility Bed Mobility Overal bed mobility: Modified Independent       Supine to sit: Modified independent (Device/Increase time)        Transfers Overall transfer level: Modified independent Equipment used: None Transfers: Sit to/from Stand         General transfer comment: recommended supervision when pt is walking outside of room due to fluctuations in HR.    Balance                    ADL       Grooming: Wash/dry hands;Independent;Standing           Upper Body Dressing : Standing;Set up   Lower Body Dressing: Independent;Sit to/from stand   Toilet Transfer: Modified Independent;Ambulation;Regular Toilet   Toileting- Marine scientist and Hygiene: Independent;Sit to/from stand       Functional mobility during ADLs: Modified independent (slower pace than typical) General ADL Comments: No difficulty performing dressing and pt has been routinely performing bathing with set up.  She demonstrated ability to take herself to the bathroom.  Cued to stand momentarily and assess for weakness or dizziness prior to walking.  Pt reports she was doing this PTA.  Recommended a shower seat and hand held shower head with supervision for safety initially when she returns home.      Vision                     Perception     Praxis      Cognition   Behavior During Therapy: WFL for tasks assessed/performed Overall Cognitive Status: Within Functional Limits for tasks assessed                       Extremity/Trunk Assessment               Exercises    Shoulder Instructions       General Comments      Pertinent Vitals/ Pain       Pain Assessment: No/denies pain  Home Living Family/patient expects to be discharged to:: Private residence  Prior Functioning/Environment              Frequency Min 2X/week     Progress Toward Goals  OT Goals(current goals can now be found in the care plan section)  Progress towards OT goals: Progressing toward goals  Acute Rehab OT Goals Patient Stated Goal: get home ASAP Potential to Achieve Goals: Good  Plan Discharge plan needs to be updated    Co-evaluation                 End of Session     Activity Tolerance Patient tolerated treatment well   Patient Left in bed;with call bell/phone within reach;with family/visitor present   Nurse Communication Mobility status (urine amount poured from hat)        Time: 1510-1537 OT Time Calculation (min): 27 min  Charges: OT General Charges $OT Visit: 1 Procedure OT Treatments $Self Care/Home Management : 23-37 mins  Malka So 02/07/2015, 3:46 PM

## 2015-02-07 NOTE — Progress Notes (Signed)
[facility]                                                                              Patient Demographics  Amanda Wilkins, is a 69 y.o. female, DOB - Jan 13, 1946, STM:[id number]  Admit date - 01/22/2015   Admitting Physician Raylene Miyamoto, MD  Outpatient Primary MD for the patient is [name], MD  LOS - 15   Chief Complaint  Patient presents with  . Irregular Heart Beat       Brief HPI   Patient is a 69 year old female with paroxysmal atrial fibrillation, hypertension, obesity presented with 2 day history of nausea, vomiting, generalized malaise, diarrhea, palpitations and fever. She had vomited several times and was unable to hold down her home medications. Lactic acid was 3.6 at the time of admission. CT chest showed inflammation in the right axilla extending inferiorly along the right chest but no focal evidence of abscess or gas. Patient was admitted to stepdown unit for further workup. BMET at the time of admission showed sodium of 127, creatinine 1.97, bicarbonate 16.   Assessment & Plan   Principal problem Septic shock due to Group A strep cellulitis, erysipelas / chest wall induration with Streptococcus bacteremia  - Improved The patient was admitted with acute viral gastroenteritis and lactic acidosis. CT chest showed inflammation in the right axilla extending inferiorly along the right chest, no focal evidence of abscess or gas. CT abdomen and pelvis showed no renal obstruction, gallbladder distention with possible gallstones.  Renal ultrasound showed no hydronephrosis, cholelithiasis however small left pleural effusion. Patient however became hypotensive, tachycardiac with respiratory distress and PCCM was consulted on 4/11. Patient required critical care support for vasopressors and was off the pressors on 4/13. HIDA scan showed a delayed filling of gallbladder, no evidence of biliary obstruction. MRI of the flank showed no focal pockets of  infection/abscess. ID, Dr. Linus Salmons signed off on 4/18, recommended to continue IV ceftriaxone through 4/24. - IV Rocephin discontinued - Patient had initially complained of some shoulder pain which has now resolved and per ID, no indication for MRI.  Active problems Anemia- normocytic, likely due to chronic disease:  - Stool occult test +, aspirin and heparin subcutaneous was discontinued. Patient was started on Coumadin on 4/22 however hemoglobin has been trending down.  - Patient had prior colonoscopy with [place] [facility], Dr. Olevia Perches which was normal in 2012. I have requested gastroenterology to evaluate patient(spoke with Azucena Freed), will hold Coumadin for now until GI evaluation is completed.  Persistent Atrial Fibrillation  - Cardiology following, rate controlled, off of IV Cardizem drip  - CHADS2VASC 3 - cardiology recommending anticoagulation, not a candidate for NOAC due to renal failure, coumadin? - Patient was placed on Coumadin however her hemoglobin has been trending down, FOBT is positive, pending GI evaluation  ?Viral Gastroenteritis - Now resolved, likely her initial GI symptoms were due to sepsis/severe cellulitis, bacteremia, patient is tolerating solid diet without any difficulty  Acute Kidney Injury - improving - Likely due to sepsis/septic shock, atrial fibrillation, hypotension, NSAIDs. - Nephrology following closely, HD catheter removed, Foley catheter removed - Outpatient follow-up with nephrology  Acute pulm edema - resolved - medical management somewhat difficult due to acute kidney injury, nephrology following, per nephrology likely in the diuretic phase of recovery   HSV1 exacerbation  Appears to be resolving - acyclovir initiated 4/19   Hyponatremia  -Likely due to renal insufficiency, improving   Thrombocytopenia  Likely due to consumptive colopathy secondary to sepsis, resolved    Abnormal Myoview  Obtained in March 2016 -reviewed cardiology  recommendations, currently no chest pain, cardiac enzymes negative. Defer further evaluation due to renal and additional significant comorbidities.  Obesity - Body mass index is 36.77 kg/(m^2).  -  patient counseled on diet and weight control   High D-dimer  - doubt PE, CT angiogram not possible due to renal function,  Doppler lower extremity negative for DVT  - holding coumadin  Suspected OSA  Saturations remaining stable - will need outpatient sleep study   Code Status:Full code  Family Communication: Discussed in detail with the patient, all imaging results, lab results explained to the patient    Disposition Plan: Patient had requested to see IR evaluation yesterday, declined by inpatient rehabilitation. Likely skilled nursing facility DC, currently on hold due to pending GI evaluation  Time Spent in minutes   25 minutes   Procedures  CT Chest > inflammation in R axilla extending inferiorly along R chest, no focal evidence of abscess or gas, mild dependent atx CT Abd/Pelvis > no renal obs, Gallbladder distention with possible internal gallstone identified Renal US > no hydronephrosis, cholelithiasis, small L pleural effusion  LE Duplex > neg for DVT, RUE Duplex > neg for DVT  HIDA Scan > delayed filling of gallbladder, but the gallbladder fills after morphine administration, no evidence of biliary obstruction   Consults   Cardiology  ID Nephrology   DVT Prophylaxis SCDs, Coumadin on hold  Medications  Scheduled Meds: . acyclovir ointment  1 application Topical I2L  . benzonatate  200 mg Oral TID  . bisoprolol  10 mg Oral BID  . chlorpheniramine-HYDROcodone  5 mL Oral Q12H  . diltiazem  180 mg Oral BID  . estrogens (conjugated)  0.3 mg Oral Q48H  . famotidine  20 mg Oral QHS  . sertraline  50 mg Oral Daily  . valACYclovir  1,000 mg Oral Daily   Continuous Infusions:  PRN Meds:.acetaminophen **OR** acetaminophen, guaifenesin, levalbuterol, loperamide, magic  mouthwash w/lidocaine, menthol-cetylpyridinium, ondansetron **OR** ondansetron (ZOFRAN) IV, sodium chloride, traMADol   Antibiotics   Anti-infectives    Start     Dose/Rate Route Frequency Ordered Stop   02/07/15 0000  valACYclovir (VALTREX) 1000 MG tablet     1,000 mg Oral Daily 02/07/15 0943     02/01/15 1200  valACYclovir (VALTREX) tablet 1,000 mg     1,000 mg Oral Daily 02/01/15 1120     01/26/15 1530  cefTRIAXone (ROCEPHIN) 2 g in dextrose 5 % 50 mL IVPB - Premix     2 g 100 mL/hr over 30 Minutes Intravenous Every 24 hours 01/26/15 1505 02/06/15 1523   01/25/15 1200  vancomycin (VANCOCIN) 1,500 mg in sodium chloride 0.9 % 500 mL IVPB  Status:  Discontinued     1,500 mg 250 mL/hr over 120 Minutes Intravenous Every 48 hours 01/23/15 1417 01/25/15 1015   01/25/15 1100  clindamycin (CLEOCIN) IVPB 900 mg  Status:  Discontinued     900 mg 100 mL/hr over 30 Minutes Intravenous 3 times per day 01/25/15 1014 01/30/15 1310   01/24/15 1600  doxycycline (VIBRAMYCIN) 100 mg in  dextrose 5 % 250 mL IVPB  Status:  Discontinued     100 mg 125 mL/hr over 120 Minutes Intravenous Every 12 hours 01/24/15 1500 01/25/15 1013   01/24/15 1400  piperacillin-tazobactam (ZOSYN) IVPB 2.25 g  Status:  Discontinued     2.25 g 100 mL/hr over 30 Minutes Intravenous 4 times per day 01/24/15 1148 01/26/15 1505   01/24/15 1000  vancomycin (VANCOCIN) IVPB 1000 mg/200 mL premix  Status:  Discontinued     1,000 mg 200 mL/hr over 60 Minutes Intravenous Every 24 hours 01/23/15 0846 01/23/15 1417   01/23/15 2200  piperacillin-tazobactam (ZOSYN) IVPB 3.375 g  Status:  Discontinued     3.375 g 12.5 mL/hr over 240 Minutes Intravenous Every 8 hours 01/23/15 0846 01/24/15 1148   01/23/15 1000  oseltamivir (TAMIFLU) capsule 30 mg  Status:  Discontinued     30 mg Oral 2 times daily 01/23/15 0803 01/23/15 1413   01/23/15 0900  vancomycin (VANCOCIN) 2,000 mg in sodium chloride 0.9 % 500 mL IVPB     2,000 mg 250 mL/hr over 120  Minutes Intravenous  Once 01/23/15 0846 01/23/15 1401   01/23/15 0845  piperacillin-tazobactam (ZOSYN) IVPB 3.375 g     3.375 g 100 mL/hr over 30 Minutes Intravenous  Once 01/23/15 0846 01/23/15 2013        Subjective:   Amanda Wilkins was seen and examined today. Coughing episodes otherwise stable, no gross GI bleeding Patient denies dizziness, chest pain, abdominal pain, N/V/D/C, new weakness, numbess, tingling.     Objective:   Blood pressure 112/83, pulse 67, temperature 97.7 F (36.5 C), temperature source Oral, resp. rate 18, height 5\' 6"  (1.676 m), weight 104.7 kg (230 lb 13.2 oz), SpO2 100 %.  Wt Readings from Last 3 Encounters:  02/07/15 104.7 kg (230 lb 13.2 oz)  01/04/15 103.874 kg (229 lb)  12/16/14 102.513 kg (226 lb)     Intake/Output Summary (Last 24 hours) at 02/07/15 1117 Last data filed at 02/07/15 0745  Gross per 24 hour  Intake    480 ml  Output    600 ml  Net   -120 ml    Exam  General: Alert and oriented x 3, NAD, coughing  HEENT:  PERRLA, EOMI, Anicteic  Neck: Supple, no JVD  CVS: S1 S2 clear, RRR, no MRG  Respiratory: Clear to auscultation bilaterally  Abdomen:  morbidly osoft, NT, ND, NBS  Ext: no cyanosis clubbing, trace to 1+ edema  Neuro: AAOx3, Cr N's II- XII. Strength 5/5 upper and lower extremities bilaterally  Skin: No rashes  Psych: Normal affect and demeanor, alert and oriented x3    Data Review   Micro Results Recent Results (from the past 240 hour(s))  Clostridium Difficile by PCR     Status: None   Collection Time: 02/01/15  4:59 PM  Result Value Ref Range Status   C difficile by pcr NEGATIVE NEGATIVE Final    Radiology Reports Ct Abdomen Pelvis Wo Contrast  01/24/2015   CLINICAL DATA:  Abdominal pain, nausea, vomiting, diarrhea. Fever and chills. Sepsis.  EXAM: CT ABDOMEN AND PELVIS WITHOUT CONTRAST  TECHNIQUE: Multidetector CT imaging of the abdomen and pelvis was performed following the standard  protocol without IV contrast.  COMPARISON:  No similar prior exam is available at this institution for comparison or on [facility].  FINDINGS: Lower chest: Small bilateral pleural effusions are noted. Interlobular septal thickening and bronchial wall thickening are suggestive of interstitial pulmonary edema.  Hepatobiliary: Hepatic hypodensity  suggests steatosis. No focal hepatic abnormality or intrahepatic ductal dilatation. Gallbladder distention with probable dependent stone image 34.  Pancreas: Normal  Spleen: Normal  Adrenals/Urinary Tract: Adrenal glands appear normal. Moderate nonspecific bilateral perinephric stranding is identified. No hydroureteronephrosis. A Foley catheter is in place and the bladder is decompressed. No radiopaque renal or ureteral calculus. Renal parenchymal detail is obscured by lack of contrast.  Stomach/Bowel: Stomach appears normal. Normal appendix. No bowel wall thickening or focal segmental dilatation is identified.  Vascular/Lymphatic: No lymphadenopathy. Mild atheromatous aortic calcification identified.  Reproductive: Uterus and ovaries are unremarkable.  Other: Mild soft tissue anasarca is present which may suggest fluid overload. No free air or free intra-abdominal fluid.  Musculoskeletal: No acute osseous abnormality. L5-S1 disc degenerative change.  IMPRESSION: Findings at the lung bases suggest interstitial pulmonary edema with mild soft tissue anasarca.  Nonspecific bilateral perinephric stranding without hydroureteronephrosis or radiopaque renal or ureteral calculus.  Gallbladder distention with possible internal gallstone identified. This again raises the question of possible cholecystitis in the setting of sepsis.   Electronically Signed   By: Conchita Paris M.D.   On: 01/24/2015 21:04   Dg Chest 2 View  01/22/2015   CLINICAL DATA:  Atrial fibrillation. Shortness of breath with exertion. Right axillary pain. No injury.  EXAM: CHEST  2 VIEW  COMPARISON:  09/22/2014   FINDINGS: Normal heart size and pulmonary vascularity. Perihilar interstitial changes suggesting chronic bronchitis. Linear atelectasis or scarring in the lung bases. No focal consolidation. No blunting of costophrenic angles. No pneumothorax. Mediastinal contours appear intact. Calcified aorta. Mild degenerative changes in the spine.  IMPRESSION: Chronic bronchitic changes in the lungs with slight fibrosis or linear atelectasis in the lung bases. No focal consolidation.   Electronically Signed   By: Lucienne Capers M.D.   On: 01/22/2015 23:46   Ct Chest Wo Contrast  01/23/2015   CLINICAL DATA:  69 year old female with right-sided chest and axillary pain. Sepsis. Fever for 1-2 weeks.  EXAM: CT CHEST WITHOUT CONTRAST  TECHNIQUE: Multidetector CT imaging of the chest was performed following the standard protocol without IV contrast.  COMPARISON:  01/22/2015 chest radiograph. Chest CT report dated 09/22/2014. Images are not available at this time due to PACS downtime.  FINDINGS: Mediastinum/Nodes: The heart and great vessels are unremarkable. There is no evidence of thoracic aortic aneurysm. No pleural or pericardial effusions are identified. No enlarged lymph nodes are noted.  Lungs/Pleura: Mild dependent and basilar atelectasis noted. There is no evidence of airspace disease, consolidation, suspicious nodule, mass or endobronchial/endotracheal lesion.  Upper abdomen: Hepatic steatosis identified.  Musculoskeletal: Right axillary inflammation is identified extending inferiorly along the right chest. There is no evidence of gas or abscess. No acute or suspicious bony abnormalities are identified.  IMPRESSION: Inflammation in the right axilla extending inferiorly along the right chest -suspicious for infection given clinical history. No evidence of focal abscess or gas.  Mild dependent/basilar atelectasis.   Electronically Signed   By: Margarette Canada M.D.   On: 01/23/2015 11:12   Mr Abdomen Wo Contrast  01/29/2015    CLINICAL DATA:  Septic shock. Evaluate subcutaneous soft tissue swelling/ edema involving the abdominal wall.  EXAM: MRI ABDOMEN WITHOUT CONTRAST  TECHNIQUE: Multiplanar multisequence MR imaging was performed without the administration of intravenous contrast.  COMPARISON:  CT scan 01/24/2015  FINDINGS: There is diffuse edema like signal abnormality in the subcutaneous fat involving the entire abdominal wall more significantly on the right side. I do not see a discrete drainable  fluid collection to suggest an abscess. No definite involvement of the abdominal muscles. There is however involvement of the latissimus dorsi muscle on the right side.  Intra-abdominal findings include bowel dilatation and cholelithiasis. No large intra-abdominal fluid collections are identified.  IMPRESSION: Diffuse subcutaneous soft tissue swelling/ edema/fluid likely reflecting cellulitis. There is also myositis involving the right latissimus dorsi muscle. No focal drainable fluid collection/abscess is identified.  Bowel dilatation is noted. No significant intra abdominal fluid collection is identified.  Gallbladder distention and cholelithiasis.   Electronically Signed   By: Marijo Sanes M.D.   On: 01/29/2015 12:56   Nm Hepatobiliary Including Gb  01/26/2015   CLINICAL DATA:  Gallstones.  Distended gallbladder on prior CT.  EXAM: NUCLEAR MEDICINE HEPATOBILIARY IMAGING  TECHNIQUE: Sequential images of the abdomen were obtained out to 60 minutes following intravenous administration of radiopharmaceutical.  RADIOPHARMACEUTICALS:  5.0 [employer] [id number] Choletec  COMPARISON:  CT 01/24/2015  FINDINGS: Prompt uptake and excretion of radiotracer by the liver. Activity is seen in small bowel by 30 minutes. No activity noted in the gallbladder at 90 minutes. Therefore, 4 mg of morphine was administered intravenously. Continued imaging demonstrates filling of the gallbladder post morphine.  IMPRESSION: Delayed filling of the gallbladder, but  the gallbladder fills after morphine administration compatible with patent cystic duct. No evidence of biliary obstruction.   Electronically Signed   By: Rolm Baptise M.D.   On: 01/26/2015 14:28   [place] Chest  01/25/2015   CLINICAL DATA:  69 year old female with right axillary and chest wall pain. Fever and sepsis. Initial encounter.  EXAM: CHEST ULTRASOUND  COMPARISON:  Chest CT 01/23/2015.  FINDINGS: Ultrasound imaging in the area of clinical concern corresponding to the right axilla and chest wall.  Lymph nodes with normal fatty hila. Diffuse subcutaneous edema. Evidence of edematous chest wall muscle (image 19) with separation of muscle fibers, but no discrete or drainable fluid collection.  IMPRESSION: Subcutaneous and chest wall muscle edema with no discrete or drainable fluid collection. Regional lymph nodes remain normal.   Electronically Signed   By: Genevie Ann M.D.   On: 01/25/2015 12:11   US Renal  01/24/2015   CLINICAL DATA:  Acute renal insufficiency.  EXAM: RENAL/URINARY TRACT ULTRASOUND COMPLETE  COMPARISON:  11/16/2009  FINDINGS: Right Kidney:  Length: 10.1 cm. Echogenicity within normal limits. No mass or hydronephrosis visualized.  Left Kidney:  Length: 10.4 cm. No hydronephrosis. Mild perinephric edema or trace fluid adjacent the lower pole left kidney.  Bladder:  Collapsed around a Foley catheter.  Exam degraded by patient body habitus. Incidental note is made of a 2.6 cm gallstone and a left-sided pleural effusion.  IMPRESSION: 1.  No hydronephrosis.  No explanation for renal insufficiency. 2. Decreased sensitivity and specificity exam due to technique related factors, as described above. 3. Cholelithiasis. 4. Small left pleural effusion.   Electronically Signed   By: Abigail Miyamoto M.D.   On: 01/24/2015 10:56   Dg Chest Port 1 View  01/28/2015   CLINICAL DATA:  Central line placement.  EXAM: PORTABLE CHEST - 1 VIEW  COMPARISON:  01/28/2015.  FINDINGS: The right IJ central venous catheter tip  is in the proximal SVC above the level of the carina. No complicating features are demonstrated. Persistent bibasilar atelectasis. Possible small right effusion.  IMPRESSION: Right IJ central venous catheter tip is in the proximal SVC. No complicating features.  Persistent bibasilar atelectasis and possible small right effusion.   Electronically Signed   By: Mamie Nick.  Gallerani M.D.   On: 01/28/2015 15:25   Dg Chest Port 1 View  01/28/2015   CLINICAL DATA:  Acute respiratory failure.  EXAM: PORTABLE CHEST - 1 VIEW  COMPARISON:  January 27, 2015.  FINDINGS: Stable cardiomediastinal silhouette. No pneumothorax is noted. Left lung is clear. New right basilar opacity is noted concerning for pneumonia or atelectasis, with possible associated pleural effusion. Left-sided PICC line is noted with distal tip overlying expected position of the SVC.  IMPRESSION: New right basilar opacity is noted concerning for pneumonia or atelectasis with associated pleural effusion. Followup radiographs are recommended.   Electronically Signed   By: Marijo Conception, M.D.   On: 01/28/2015 08:26   Dg Chest Port 1 View  01/27/2015   CLINICAL DATA:  69 year old female with sepsis, septic shock. Initial encounter.  EXAM: PORTABLE CHEST - 1 VIEW  COMPARISON:  01/24/2015 and earlier.  FINDINGS: Portable AP upright view at 0802 hours. Stable left PICC line. Mildly improved lung volumes and bibasilar ventilation. Bilateral increased interstitial markings have regressed and now appear at baseline compared to [place]. No pneumothorax. No pleural effusion or consolidation. Stable cardiac size and mediastinal contours.  IMPRESSION: Regressed bilateral interstitial opacity, lung parenchyma now all appears at baseline compared to 2015 studies. No new cardiopulmonary abnormality.   Electronically Signed   By: Genevie Ann M.D.   On: 01/27/2015 08:08   Dg Chest Port 1 View  01/24/2015   CLINICAL DATA:  Wheezing and shortness of breath.  EXAM: PORTABLE  CHEST - 1 VIEW  COMPARISON:  01/24/2015  FINDINGS: Left-sided PICC line tip overlies the superior vena cava. Heart is mildly prominent. There Kerley B-lines consistent with pulmonary edema. No focal consolidation. No overt alveolar edema.  IMPRESSION: 1. Mild interstitial edema. 2. Left-sided PICC line tip to the superior vena cava.   Electronically Signed   By: Nolon Nations M.D.   On: 01/24/2015 22:14   Dg Chest Port 1 View  01/24/2015   CLINICAL DATA:  Hypoxia, pt was admitted with an acute URI and right armpit infection and was found to be in a-fib with RVR, hypovolemic, hyponatremic with an acute renal failure.  EXAM: PORTABLE CHEST - 1 VIEW  COMPARISON:  Radiograph 01/23/2015  FINDINGS: Left-sided PICC line with tip in distal SVC. Stable cardiac silhouette. There is mild central venous congestion. No focal infiltrate. No pulmonary edema. Mild basilar atelectasis.  IMPRESSION: Mild basilar atelectasis and minimal central venous congestion. No change from prior.   Electronically Signed   By: Suzy Bouchard M.D.   On: 01/24/2015 09:44   Dg Chest Port 1 View  01/23/2015   CLINICAL DATA:  Shortness of breath.  EXAM: PORTABLE CHEST - 1 VIEW  COMPARISON:  Same day.  FINDINGS: The heart size and mediastinal contours are within normal limits. Both lungs are clear. No pneumothorax or pleural effusion is noted. Left-sided PICC line is noted with tip in expected position of cavoatrial junction. The visualized skeletal structures are unremarkable.  IMPRESSION: No acute cardiopulmonary abnormality seen.   Electronically Signed   By: Marijo Conception, M.D.   On: 01/23/2015 18:52   Dg Chest Port 1 View  01/23/2015   CLINICAL DATA:  PICC line placement  EXAM: PORTABLE CHEST - 1 VIEW  COMPARISON:  CT scan of the chest same day  FINDINGS: Cardiomediastinal silhouette is unremarkable. No acute infiltrate or pulmonary edema. There is a left arm PICC line with tip in right atrium. For distal SVC position  the PICC line  should be retracted about 1 cm. No pneumothorax.  IMPRESSION: Left arm PICC line with tip in right atrium. For distal SVC position PICC line should be retracted about 1 cm. No pneumothorax.   Electronically Signed   By: Lahoma Crocker M.D.   On: 01/23/2015 17:45   US Abdomen Limited Ruq  01/25/2015   CLINICAL DATA:  Upper abdominal pain  EXAM: US ABDOMEN LIMITED - RIGHT UPPER QUADRANT  COMPARISON:  None.  FINDINGS: Gallbladder:  There is a a focal gallstone measuring 1.9 cm in length which moves and shadows. Gallbladder wall is not thickened, and there is no pericholecystic fluid. No sonographic Murphy sign noted.  Common bile duct:  Diameter: 5 mm. No intrahepatic or extrahepatic biliary duct dilatation.  Liver:  No focal lesion identified. Liver echogenicity is diffusely increased.  There is a small right pleural effusion.  IMPRESSION: Cholelithiasis. Increased liver echogenicity, most likely due to hepatic steatosis. While no focal liver lesions are identified, it must be cautioned that the sensitivity of ultrasound for focal liver lesions is diminished in this circumstance. Small right pleural effusion noted.   Electronically Signed   By: Lowella Grip III M.D.   On: 01/25/2015 12:15    CBC  Recent Labs Lab 02/02/15 0230 02/03/15 0456 02/04/15 0440 02/05/15 0440 02/06/15 0603 02/07/15 0550  WBC 11.3* 9.9 8.4 7.5 6.9 5.9  HGB 9.3* 9.4* 9.1* 8.8* 8.4* 8.3*  HCT 26.6* 27.0* 26.2* 25.6* 25.1* 24.6*  PLT 211 257 264 268 240 234  MCV 96.0 95.4 97.0 97.7 99.6 99.6  MCH 33.6 33.2 33.7 33.6 33.3 33.6  MCHC 35.0 34.8 34.7 34.4 33.5 33.7  RDW 13.8 13.8 14.0 14.0 14.2 14.1  LYMPHSABS 1.8  --   --   --   --   --   MONOABS 0.5  --   --   --   --   --   EOSABS 0.1  --   --   --   --   --   BASOSABS 0.0  --   --   --   --   --     Chemistries   Recent Labs Lab 02/02/15 0230 02/03/15 0440 02/04/15 0440 02/05/15 0440 02/06/15 0603  NA 130* 132* 133* 135 135  K 3.0* 2.9* 3.3* 3.6 4.0  CL  91* 93* 94* 97 99  CO2 27 28 27 25 25   GLUCOSE 110* 114* 109* 108* 109*  BUN 37* 39* 39* 40* 37*  CREATININE 3.93* 3.87* 3.77* 3.65* 3.43*  CALCIUM 8.1* 8.2* 8.2* 8.6 8.6  MG 1.6  --   --   --   --   AST 23  --   --   --   --   ALT 23  --   --   --   --   ALKPHOS 57  --   --   --   --   BILITOT 0.5  --   --   --   --    ------------------------------------------------------------------------------------------------------------------ estimated creatinine clearance is 19.2 mL/min (by C-G formula based on Cr of 3.43). ------------------------------------------------------------------------------------------------------------------ No results for input(s): HGBA1C in the last 72 hours. ------------------------------------------------------------------------------------------------------------------ No results for input(s): CHOL, HDL, LDLCALC, TRIG, CHOLHDL, LDLDIRECT in the last 72 hours. ------------------------------------------------------------------------------------------------------------------ No results for input(s): TSH, T4TOTAL, T3FREE, THYROIDAB in the last 72 hours.  Invalid input(s): FREET3 ------------------------------------------------------------------------------------------------------------------  Recent Labs  02/04/15 1410  VITAMINB12 1096*  FOLATE 8.0  FERRITIN 689*  TIBC 234*  IRON 22*  RETICCTPCT 2.0    Coagulation profile  Recent Labs Lab 02/05/15 0440 02/06/15 0603 02/07/15 0550  INR 1.00 1.29 1.34    No results for input(s): DDIMER in the last 72 hours.  Cardiac Enzymes No results for input(s): CKMB, TROPONINI, MYOGLOBIN in the last 168 hours.  Invalid input(s): CK ------------------------------------------------------------------------------------------------------------------ Invalid input(s): POCBNP  No results for input(s): GLUCAP in the last 72 hours.   RAI,RIPUDEEP M.D. [facility] 02/07/2015, 11:17 AM  Pager: [phone]    Between 7am to 7pm - call Pager - [phone]  After 7pm go to [email or web address] - password [email or web address]  Call night coverage person covering after 7pm

## 2015-02-07 NOTE — Telephone Encounter (Signed)
Pt called and she has been in hospital for the last 2 weeks and she is not happy with what they are doing?  She would like to talk to dr plot regarding what they are telling her.     Best number [phone]

## 2015-02-07 NOTE — Telephone Encounter (Signed)
Spoke with patient. Advised her to ask her nurse to help explain any medication concerns. She is worried because there are so many medicine changes since she's been in hospital. Also advised to discuss also with the team of physicians that are seeing her. She verbalizes understanding. States she was really sick, but feeling better but still has more testing.  Adv will likely have a follow up with Dr. [name] when discharged from hospital.

## 2015-02-07 NOTE — Progress Notes (Signed)
Admit: 01/22/2015 LOS: 15  43F AKI 2/2 ATN related to GAS cellulitis/sepsis, now improving  Subjective:  No new events Feels well Good uOP, not on diuretics   04/24 0701 - 04/25 0700 In: 560 [P.O.:560] Out: 600 [Urine:600]  Filed Weights   02/05/15 0524 02/06/15 0435 02/07/15 0346  Weight: 105 kg (231 lb 7.7 oz) 104.2 kg (229 lb 11.5 oz) 104.7 kg (230 lb 13.2 oz)    Scheduled Meds: . acyclovir ointment  1 application Topical A8T  . benzonatate  200 mg Oral TID  . bisoprolol  10 mg Oral BID  . chlorpheniramine-HYDROcodone  5 mL Oral Q12H  . diltiazem  180 mg Oral BID  . estrogens (conjugated)  0.3 mg Oral Q48H  . famotidine  20 mg Oral QHS  . sertraline  50 mg Oral Daily  . valACYclovir  1,000 mg Oral Daily   Continuous Infusions:  PRN Meds:.acetaminophen **OR** acetaminophen, guaifenesin, levalbuterol, loperamide, magic mouthwash w/lidocaine, menthol-cetylpyridinium, ondansetron **OR** ondansetron (ZOFRAN) IV, sodium chloride, traMADol  Current Labs: reviewed    Physical Exam:  Blood pressure 112/83, pulse 67, temperature 97.7 F (36.5 C), temperature source Oral, resp. rate 18, height 5\' 6"  (1.676 m), weight 104.7 kg (230 lb 13.2 oz), SpO2 100 %. NAD, obese IRIR, no rub CTAB No edema Nonfocal  A/P  1. AKI/CKD in setting of sepsis. First HD on 01/28/15 due to pulmonary edema and SOB. She had a second session of HD on 01/29/15 but none since then 1. Continues to slowly recover 2. Will arrange outpt f/u with [place] to follow AKI and recovery 3. No strong indications for diuretics 4. Daily weights, Daily Renal Panel, Strict I/Os, Avoid nephrotoxins (NSAIDs, judicious IV Contrast) 2. Group A strep sepsis- ID following 3. Hypokalemia- stable 4. A fib with RVR- on dilt drip, Cardiology following. 5. Hyponatremia- resolved.  6. Oral ulcers- per primary svc. 7. Metabolic acidosis- resolved. 8. SIRS- improving 9. Anemia- follow and transfuse prn 10. DM- per primary  svc  Will sign off for now and arrange f/u with [place] in clinic.  Please call with any questions or concerns.  Pearson Grippe MD 02/07/2015, 10:24 AM   Recent Labs Lab 02/01/15 0422  02/03/15 0440 02/04/15 0440 02/05/15 0440 02/06/15 0603  NA 129*  < > 132* 133* 135 135  K 3.1*  < > 2.9* 3.3* 3.6 4.0  CL 90*  < > 93* 94* 97 99  CO2 26  < > 28 27 25 25   GLUCOSE 105*  < > 114* 109* 108* 109*  BUN 36*  < > 39* 39* 40* 37*  CREATININE 3.81*  < > 3.87* 3.77* 3.65* 3.43*  CALCIUM 8.3*  < > 8.2* 8.2* 8.6 8.6  PHOS 6.7*  --  5.9*  --   --   --   < > = values in this interval not displayed.  Recent Labs Lab 02/02/15 0230  02/05/15 0440 02/06/15 0603 02/07/15 0550  WBC 11.3*  < > 7.5 6.9 5.9  NEUTROABS 8.8*  --   --   --   --   HGB 9.3*  < > 8.8* 8.4* 8.3*  HCT 26.6*  < > 25.6* 25.1* 24.6*  MCV 96.0  < > 97.7 99.6 99.6  PLT 211  < > 268 240 234  < > = values in this interval not displayed.

## 2015-02-07 NOTE — Telephone Encounter (Signed)
I called and left a VM Chart reviewed, events noted

## 2015-02-07 NOTE — Progress Notes (Signed)
Physical Therapy Treatment Patient Details Name: Amanda Wilkins MRN: [id number] DOB: [birth year] Today's Date: 02/07/2015    History of Present Illness Adm 01/22/15 with n/v, general malaise, diarrhea, fever, and Rt flank pain (which turned into Group A Strep infection); sepsis with hypotension, afib with RVR; 4/15 fluid overload with plans for HD cath/HD  PMHx-afib, OA, obesity    PT Comments    Pt was seen for assessment of her current status and noted HR is fluctuating between 43 and 123 over the course of her treatment.  With RW was 43-50, then with no AD was 123, but rapid recovery to 107and O2 sats were 99-100% for all.  Pt is tearful about her new information from hospitalist about GI bleed.  Discussed her HR and assured pt that a slower rate is not all bad with her information about posterior heart ischemia.   Follow Up Recommendations  SNF;Supervision for mobility/OOB     Equipment Recommendations  Rolling walker with 5" wheels    Recommendations for Other Services       Precautions / Restrictions Precautions Precautions: Fall Precaution Comments: contact Restrictions Weight Bearing Restrictions: No    Mobility  Bed Mobility Overal bed mobility: Modified Independent       Supine to sit: Modified independent (Device/Increase time)        Transfers Overall transfer level: Modified independent Equipment used: Rolling walker (2 wheeled) Transfers: Sit to/from [facility] Sit to Stand: Supervision Stand pivot transfers: Supervision       General transfer comment: supervised as pt is having a great deal of fluctuation oni her vitals  Ambulation/Gait Ambulation/Gait assistance: Supervision Ambulation Distance (Feet): 120 Feet Assistive device: Rolling walker (2 wheeled);1 person hand held assist Gait Pattern/deviations: Step-through pattern;Decreased stride length;Wide base of support;Drifts right/left Gait velocity: reduced Gait velocity  interpretation: Below normal speed for age/gender General Gait Details: unsteady in the half of gait without the walker   Stairs            Wheelchair Mobility    Modified Rankin (Stroke Patients Only)       Balance Overall balance assessment: Needs assistance Sitting-balance support: Feet supported Sitting balance-Leahy Scale: Good   Postural control: Posterior lean Standing balance support: Bilateral upper extremity supported;No upper extremity supported Standing balance-Leahy Scale: Fair Standing balance comment: fair- to dynamically stand and changes stance to accomodate                    Cognition Arousal/Alertness: Awake/alert Behavior During Therapy: WFL for tasks assessed/performed Overall Cognitive Status: Within Functional Limits for tasks assessed                      Exercises Other Exercises Other Exercises: Strength testing was WFL x 4+ RLE and 5 LLE    General Comments General comments (skin integrity, edema, etc.): Pt is having some emotional response to some medical news with hospitalist telling her she is having some GI bleed that has worsened over her stay.  Pt upset as she was expecting to leave      Pertinent Vitals/Pain Pain Assessment: No/denies pain    Home Living                      Prior Function            PT Goals (current goals can now be found in the care plan section) Acute Rehab PT Goals Patient Stated Goal: get home ASAP PT  Goal Formulation: With patient Progress towards PT goals: Progressing toward goals    Frequency  Min 3X/week    PT Plan Current plan remains appropriate    Co-evaluation             End of Session Equipment Utilized During Treatment: Gait belt Activity Tolerance: Treatment limited secondary to medical complications (Comment) (a-fib continues to affect her speed and assistive device) Patient left: in chair;with call bell/phone within reach     Time: 1015-1043 PT  Time Calculation (min) (ACUTE ONLY): 28 min  Charges:  $Gait Training: 8-22 mins $Therapeutic Exercise: 8-22 mins                    G Codes:      Ramond Dial 02/23/2015, 1:18 PM   Mee Hives, PT MS Acute Rehab Dept. Number: [phone]

## 2015-02-08 ENCOUNTER — Encounter (HOSPITAL_COMMUNITY)
Admission: EM | Disposition: A | Discharge status: Skilled Nursing Facility | Payer: Self-pay | Source: Home / Self Care | Attending: Internal Medicine

## 2015-02-08 ENCOUNTER — Encounter (HOSPITAL_COMMUNITY): Payer: Self-pay | Admitting: *Deleted

## 2015-02-08 DIAGNOSIS — R195 Other fecal abnormalities: Secondary | ICD-10-CM

## 2015-02-08 HISTORY — PX: ESOPHAGOGASTRODUODENOSCOPY: SHX5428

## 2015-02-08 LAB — CBC
HEMATOCRIT: 25.3 % — AB (ref 36.0–46.0)
Hemoglobin: 8.4 g/dL — ABNORMAL LOW (ref 12.0–15.0)
MCH: 32.8 pg (ref 26.0–34.0)
MCHC: 33.2 g/dL (ref 30.0–36.0)
MCV: 98.8 fL (ref 78.0–100.0)
Platelets: 261 10*3/uL (ref 150–400)
RBC: 2.56 MIL/uL — AB (ref 3.87–5.11)
RDW: 14.2 % (ref 11.5–15.5)
WBC: 5.6 10*3/uL (ref 4.0–10.5)

## 2015-02-08 LAB — BASIC METABOLIC PANEL
Anion gap: 11 (ref 5–15)
BUN: 35 mg/dL — AB (ref 6–23)
CO2: 23 mmol/L (ref 19–32)
CREATININE: 3.04 mg/dL — AB (ref 0.50–1.10)
Calcium: 8.8 mg/dL (ref 8.4–10.5)
Chloride: 105 mmol/L (ref 96–112)
GFR calc Af Amer: 17 mL/min — ABNORMAL LOW (ref >90)
GFR calc non Af Amer: 15 mL/min — ABNORMAL LOW (ref >90)
Glucose, Bld: 108 mg/dL — ABNORMAL HIGH (ref 70–99)
POTASSIUM: 4 mmol/L (ref 3.5–5.1)
SODIUM: 139 mmol/L (ref 135–145)

## 2015-02-08 LAB — PROTIME-INR
INR: 1.24 (ref 0.00–1.49)
Prothrombin Time: 15.7 seconds — ABNORMAL HIGH (ref 11.6–15.2)

## 2015-02-08 SURGERY — EGD (ESOPHAGOGASTRODUODENOSCOPY)
Anesthesia: Moderate Sedation

## 2015-02-08 MED ORDER — FENTANYL CITRATE (PF) 100 MCG/2ML IJ SOLN
INTRAMUSCULAR | Status: AC
  Filled 2015-02-08: qty 2

## 2015-02-08 MED ORDER — WARFARIN - PHARMACIST DOSING INPATIENT
Freq: Every day | Status: DC
  Administered 2015-02-09: 18:00:00

## 2015-02-08 MED ORDER — MIDAZOLAM HCL 5 MG/5ML IJ SOLN
INTRAMUSCULAR | Status: DC | PRN
  Administered 2015-02-08: 1 mg via INTRAVENOUS
  Administered 2015-02-08: 2 mg via INTRAVENOUS
  Administered 2015-02-08: 1 mg via INTRAVENOUS

## 2015-02-08 MED ORDER — BUTAMBEN-TETRACAINE-BENZOCAINE 2-2-14 % EX AERO
INHALATION_SPRAY | CUTANEOUS | Status: DC | PRN
  Administered 2015-02-08: 2 via TOPICAL

## 2015-02-08 MED ORDER — WARFARIN SODIUM 5 MG PO TABS
5.0000 mg | ORAL_TABLET | Freq: Once | ORAL | Status: AC
  Administered 2015-02-08: 5 mg via ORAL
  Filled 2015-02-08: qty 1

## 2015-02-08 MED ORDER — MIDAZOLAM HCL 5 MG/ML IJ SOLN
INTRAMUSCULAR | Status: AC
  Filled 2015-02-08: qty 2

## 2015-02-08 MED ORDER — FENTANYL CITRATE (PF) 100 MCG/2ML IJ SOLN
INTRAMUSCULAR | Status: DC | PRN
  Administered 2015-02-08: 25 ug via INTRAVENOUS

## 2015-02-08 NOTE — Interval H&P Note (Signed)
History and Physical Interval Note:  02/08/2015 12:45 PM  Amanda Wilkins  has presented today for surgery, with the diagnosis of FOBT positive, anemia  The various methods of treatment have been discussed with the patient and family. After consideration of risks, benefits and other options for treatment, the patient has consented to  Procedure(s): ESOPHAGOGASTRODUODENOSCOPY (EGD) (N/A) as a surgical intervention .  The patient's history has been reviewed, patient examined, no change in status, stable for surgery.  I have reviewed the patient's chart and labs.  Questions were answered to the patient's satisfaction.    The recent H&P (dated *02/07/15**) was reviewed, the patient was examined and there is no change in the patients condition since that H&P was completed.   Erskine Emery  02/08/2015, 12:45 PM    Erskine Emery

## 2015-02-08 NOTE — H&P (View-Only) (Signed)
[facility] Consult: 10:07 AM 02/07/2015  LOS: 15 days    Referring Provider: Dr Tana Coast  Primary Care Physician:  [name], MD Primary Gastroenterologist:  Dr. Olevia Perches for 2012 screening colonoscopy.     Reason for Consultation:  FOBT+ anemia.    HPI: Amanda Wilkins is a 69 y.o. female. PMH: PAF (on ASA only PTA), obesity, htn, suspected OSA.   01/2011 Colonoscopy, screening study.  Normal study: no polyps, no tics.  Admitted with sepsis from group A strep cellulitis, erisipelas, chest wall/right axillary induration, HSV 1 exacerbation. .  Presented with fever, body aches, diarrhea, N/V. Treated for rapid a fib (rate stil in 1teens) and started on [facility].  AKI with transient need for HD. Pulmonary edema.  Once ready, she will go to [facility].  She has developed anemia during the admission.  Baseline hgb 13.5. 11.8 after initial hydration.  Has drifted steadily through 10, then 9s, then 8s and today it is 8.3.  FOBT is + so Warfarin discontinued 4/23. Heparin discontinued on 4/22.  Patient denies any current or previous history of melena or bleeding per rectum.  At the time of admission she was taking daily Celebrex as well as full dose aspirin. She had no gastric or intestinal symptoms and was not using any GI protective medication. Bowel movements generally are daily.  No dysphagia. Has never undergone upper endoscopy. Patient does express upset that physicians had not advised her that her blood counts were steadily declining and that this information was only shared with her today when she thought she was can go to rehabilitation today.  01/24/15 CT abdomen showed "Gallbladder distention with possible internal gallstone identified... again raises the question of possible cholecystitis in the setting of  sepsis." also showed non-specific bil peri-nehric stranding without stone or hydro, soft tissue anasarca, pulmonary edema.  01/26/2015 MR abdomen: Diffuse subcutaneous soft tissue swelling/ edema/fluid likely reflecting cellulitis. There is also myositis involving the right latissimus dorsi muscle. No focal drainable fluid collection/abscess is identified. Bowel dilatation is noted. No significant intra abdominal fluid collection is identified.  Gallbladder distention and cholelithiasis. 01/26/2015 HIDA: Delayed filling of the gallbladder, but the gallbladder fills after morphine administration compatible with patent cystic duct. No evidence of biliary obstruction.   Past Medical History  Diagnosis Date  . Allergy   . Atrial fibrillation   . Migraines     on Zoloft for migraines  . Hypertension   . Chronic interstitial nephritis   . Depression   . Palpitations   . Obesity   . Arthritis     ON BOTH KNEES    Past Surgical History  Procedure Laterality Date  . Cesarean section    . Dental surgery      multiple implants  . Repair of torn meniscus on left 08/2014 Left 08/2015    Dr. Ronnie Derby at [facility]    Prior to Admission medications   Medication Sig Start Date End Date Taking? Authorizing Provider  aspirin EC 325 MG tablet Take 325 mg by mouth at bedtime.  Yes Historical Provider, MD  atenolol (TENORMIN) 50 MG tablet TAKE 1 TABLET BY MOUTH DAILY Patient taking differently: TAKE 1 TABLET BY MOUTH DAILY AT BEDTIME 03/22/14  Yes Aleksei Plotnikov V, MD  Cholecalciferol (VITAMIN D) 2000 UNITS tablet Take 2,000 Units by mouth daily.   Yes Historical Provider, MD  DILT-XR 180 MG 24 hr capsule TAKE 1 CAPSULE BY MOUTH DAILY 08/05/14  Yes Aleksei Plotnikov V, MD  estrogens, conjugated, (PREMARIN) 0.3 MG tablet Take daily Patient taking differently: Take 0.15 mg by mouth at bedtime. Tapering down:  Take 1/2 tablet (0.15 mg) daily until the end of May, then stop 01/19/14  Yes Terrance Mass, MD    glycopyrrolate (ROBINUL) 1 MG tablet Take 1-2 mg by mouth 2 (two) times daily. Take 2 tablets (2 mg) every morning and 1 tablet (1 mg) every night   Yes Historical Provider, MD  medroxyPROGESTERone (PROVERA) 10 MG tablet TAKE ONE TABLET 10 DAYS OF THE MONTH Patient taking differently: Take 10 mg by mouth See admin instructions. Take 1 tablet (10 mg) on the 1st thru 10th of each month. Will stop after the 10th of May 2016 01/19/14  Yes Terrance Mass, MD  mupirocin ointment (BACTROBAN) 2 % Applied twice a day to the affected area;NOT into eyes. Patient taking differently: Apply 1 application topically 2 (two) times daily as needed (ear drainage/swelling). Do not use in eyes 01/04/15  Yes Hendricks Limes, MD  nitrofurantoin, macrocrystal-monohydrate, (MACROBID) 100 MG capsule Take one twice a day for one week then take one after intercourse Patient taking differently: Take 100 mg by mouth once as needed (after intercourse to prevent UTI).  12/06/14  Yes Terrance Mass, MD  acyclovir ointment (ZOVIRAX) 5 % Apply 1 application topically every 3 (three) hours. Apply to lips for 10days or until healed 02/07/15   Ripudeep K Rai, MD  Alum & Mag Hydroxide-Simeth (MAGIC MOUTHWASH W/LIDOCAINE) SOLN Take 5 mLs by mouth 3 (three) times daily as needed for mouth pain. 02/07/15   Ripudeep Krystal Eaton, MD  benzonatate (TESSALON) 200 MG capsule Take 1 capsule (200 mg total) by mouth 3 (three) times daily. 02/07/15   Ripudeep Krystal Eaton, MD  bisoprolol (ZEBETA) 10 MG tablet Take 1 tablet (10 mg total) by mouth 2 (two) times daily. 02/07/15   Ripudeep Krystal Eaton, MD  celecoxib (CELEBREX) 200 MG capsule Take 200 mg by mouth daily.    Historical Provider, MD  chlorpheniramine-HYDROcodone (TUSSIONEX) 10-8 MG/5ML SUER Take 5 mLs by mouth every 12 (twelve) hours as needed for cough. 02/07/15   Ripudeep Krystal Eaton, MD  clarithromycin (BIAXIN XL) 500 MG 24 hr tablet Take 2 tablets (1,000 mg total) by mouth daily. Patient not taking: Reported on  01/23/2015 01/04/15   Hendricks Limes, MD  diltiazem (CARDIZEM CD) 180 MG 24 hr capsule Take 1 capsule (180 mg total) by mouth 2 (two) times daily. 02/07/15   Ripudeep Krystal Eaton, MD  loperamide (IMODIUM) 2 MG capsule Take 1 capsule (2 mg total) by mouth 3 (three) times daily as needed for diarrhea or loose stools. 02/07/15   Ripudeep Krystal Eaton, MD  menthol-cetylpyridinium (CEPACOL) 3 MG lozenge Take 1 lozenge (3 mg total) by mouth as needed for sore throat. 02/07/15   Ripudeep Krystal Eaton, MD  sertraline (ZOLOFT) 50 MG tablet Take 1 tablet (50 mg total) by mouth daily. 02/07/15   Ripudeep Krystal Eaton, MD  traMADol (ULTRAM) 50 MG tablet Take 1 tablet (50 mg total) by mouth every  12 (twelve) hours as needed for moderate pain. 02/07/15   Ripudeep Krystal Eaton, MD  valACYclovir (VALTREX) 1000 MG tablet Take 1 tablet (1,000 mg total) by mouth daily. X 14days 02/07/15   Ripudeep Krystal Eaton, MD    Scheduled Meds: . acyclovir ointment  1 application Topical L3Y  . benzonatate  200 mg Oral TID  . bisoprolol  10 mg Oral BID  . chlorpheniramine-HYDROcodone  5 mL Oral Q12H  . diltiazem  180 mg Oral BID  . estrogens (conjugated)  0.3 mg Oral Q48H  . famotidine  20 mg Oral QHS  . sertraline  50 mg Oral Daily  . valACYclovir  1,000 mg Oral Daily   Infusions:   PRN Meds: acetaminophen **OR** acetaminophen, guaifenesin, levalbuterol, loperamide, magic mouthwash w/lidocaine, menthol-cetylpyridinium, ondansetron **OR** ondansetron (ZOFRAN) IV, sodium chloride, traMADol   Allergies as of 01/22/2015 - Review Complete 01/22/2015  Allergen Reaction Noted  . Epinephrine Other (See Comments) 01/08/2011  . Aspirin  06/13/2011  . Epinephrine base Other (See Comments) 07/12/2014  . Penicillins Other (See Comments)     Family History  Problem Relation Age of Onset  . Cancer Brother     PANCREATIC    History   Social History  . Marital Status: Married    Spouse Name: N/A  . Number of Children: N/A  . Years of Education: N/A    Occupational History  . retired   . Part-time (with temp agency)    Social History Main Topics  . Smoking status: Never Smoker   . Smokeless tobacco: Never Used     Comment: H/O social smoking at times when drinking--never a "habit"  . Alcohol Use: 8.4 oz/week    14 Glasses of wine per week     Comment: WINE- TWO GLASSES AT [employer]NER  . Drug Use: No  . Sexual Activity: Yes   Other Topics Concern  . Not on file   Social History Narrative    REVIEW OF SYSTEMS: Constitutional:  Weight is stable. Ambulating without a walker. ENT:  No nose bleeds Pulm:  No cough or dyspnea CV:  No palpitations, no LE edema. No chest pain GU:  No hematuria, no frequency GI:  No dysphagia. Per HPI Breast: normal mammogram 01/2013.  Heme:  Never had issues with anemia in past.   Transfusions:  None ever. Neuro:  No headaches, no peripheral tingling or numbness Derm:  No itching, no rash or sores.  Endocrine:  No sweats or chills.  No polyuria or dysuria Immunization:  Flu shot up-to-date.  Zoster vaccine in 2012. Pneumovax in 2015 Travel:  None beyond local counties in last few months.    PHYSICAL EXAM: Vital signs in last 24 hours: Filed Vitals:   02/07/15 0405  BP: 112/83  Pulse: 67  Temp: 97.7 F (36.5 C)  Resp: 18   Wt Readings from Last 3 Encounters:  02/07/15 230 lb 13.2 oz (104.7 kg)  01/04/15 229 lb (103.874 kg)  12/16/14 226 lb (102.513 kg)   General: Obese, pale, comfortable, somewhat ill appearing WF. Head:  No facial asymmetry, swelling. No signs of head trauma.  There is some healing scabbed lesions on the right side of her face.  Eyes:  No scleral icterus or conjunctival pallor. EOMI. Ears:  No hearing deficit  Nose:  No congestion or discharge. Mouth:  Oral cavity with moist, clear mucous membranes. Dentition in good repair. Neck:  No mass, no JVD. Lungs:  Clear to auscultation bilaterally. No cough or labored breathing.  Heart: Irregularly irregular rhythm. No  murmurs rubs or gallops. Abdomen:  Lots of bruises on the abdomen from where she was getting heparin injections. Right sided abdominal rash/wound covered with dressing, was lifted for exam and there is no blood at the site..   Rectal: Stool is soft, medium brown and 1+ FOBT positive. No masses or hemorrhoids palpated or seen.  Scars on left knee consistent with her arthroscopic surgery. Musc/Skeltl: No joint contractures, erythema or other gross deformities.   Extremities:  Non-pitting pedal edema which is slight.  Neurologic:  Alert, oriented 3. No tremor. No weakness of the limbs. Grossly no neurologic deficits. Skin:  Rash seen on the abdomen as well as the right flank and lower sacral areas. Tattoos:  None Nodes:  No cervical adenopathy.   Psych:  Cooperative, pleasant, relaxed.  Intake/Output from previous day: 04/24 0701 - 04/25 0700 In: 560 [P.O.:560] Out: 600 [Urine:600] Intake/Output this shift:    LAB RESULTS:  Recent Labs  02/05/15 0440 02/06/15 0603 02/07/15 0550  WBC 7.5 6.9 5.9  HGB 8.8* 8.4* 8.3*  HCT 25.6* 25.1* 24.6*  PLT 268 240 234  MCV                                          99  BMET Lab Results  Component Value Date   NA 135 02/06/2015   NA 135 02/05/2015   NA 133* 02/04/2015   K 4.0 02/06/2015   K 3.6 02/05/2015   K 3.3* 02/04/2015   CL 99 02/06/2015   CL 97 02/05/2015   CL 94* 02/04/2015   CO2 25 02/06/2015   CO2 25 02/05/2015   CO2 27 02/04/2015   GLUCOSE 109* 02/06/2015   GLUCOSE 108* 02/05/2015   GLUCOSE 109* 02/04/2015   BUN 37* 02/06/2015   BUN 40* 02/05/2015   BUN 39* 02/04/2015   CREATININE 3.43* 02/06/2015   CREATININE 3.65* 02/05/2015   CREATININE 3.77* 02/04/2015   CALCIUM 8.6 02/06/2015   CALCIUM 8.6 02/05/2015   CALCIUM 8.2* 02/04/2015   LFT No results for input(s): PROT, ALBUMIN, AST, ALT, ALKPHOS, BILITOT, BILIDIR, IBILI in the last 72 hours. PT/INR Lab Results  Component Value Date   INR 1.34 02/07/2015   INR  1.29 02/06/2015   INR 1.00 02/05/2015    RADIOLOGY STUDIES: Per HPI.    ENDOSCOPIC STUDIES: Per HPI.   IMPRESSION:   *  FOBT + anemia.  Rule out gastritis, NSAID-induced ulcers, AVMs. However no GI symptoms to support any evidence for acid peptic disease. Had normal screening colonoscopy 01/2011.   *  Cellulitis with group A, sepsis.  Resolving.  *   Rapid Afib, previous history paroxysmal atrial fibrillation treated with aspirin alone.   As inpatient she was on heparin but had recently been transitioned to Coumadin, both heparin and Coumadin as well as aspirin discontinued. are now discontinued. .  Weight still somewhat tachycardic.  *  AKI.      PLAN:     *  Likely needs EGD.  Pt is reluctant for this due to need for sedation but is likely to consent.  Had solid AM meal today but currently NPO.  Currently EGD for tomorrow at 12:30 PM.     Azucena Freed  02/07/2015, 10:07 AM Pager: [phone]

## 2015-02-08 NOTE — Progress Notes (Signed)
No abnormalities by EGD. Recommend f/u hemeoccults while off NSAIDS.  If still positive will need colo/possible capsule study.  OK to restart coumadin.

## 2015-02-08 NOTE — Progress Notes (Signed)
    Subjective:  Pt going for endo today. Anemia eval. AFIB for the most part rate controlled. <110 on avg.     Objective:  Vital Signs in the last 24 hours: Temp:  [98 F (36.7 C)] 98 F (36.7 C) (04/25 2100) Pulse Rate:  [94-101] 94 (04/26 0511) Resp:  [18] 18 (04/26 0511) BP: (129-134)/(60-65) 129/60 mmHg (04/26 0511) SpO2:  [99 %-100 %] 99 % (04/26 0511) Weight:  [228 lb 13.4 oz (103.8 kg)] 228 lb 13.4 oz (103.8 kg) (04/26 0543)  Intake/Output from previous day:  Intake/Output Summary (Last 24 hours) at 02/08/15 1018 Last data filed at 02/08/15 0730  Gross per 24 hour  Intake    260 ml  Output   2000 ml  Net  -1740 ml    Physical Exam:  Gen: laying in bed no distress Abd: overweight Neuro: intact, AAO x 3   Rate: 100-140 (on avg < 110)  Rhythm: atrial fibrillation  Lab Results:  CBC Latest Ref Rng 02/07/2015 02/06/2015 02/05/2015  WBC 4.0 - 10.5 K/uL 5.9 6.9 7.5  Hemoglobin 12.0 - 15.0 g/dL 8.3(L) 8.4(L) 8.8(L)  Hematocrit 36.0 - 46.0 % 24.6(L) 25.1(L) 25.6(L)  Platelets 150 - 400 K/uL 234 240 268     Recent Labs  02/07/15 1043 02/08/15 0500  NA 136 139  K 4.0 4.0  CL 102 105  CO2 23 23  GLUCOSE 129* 108*  BUN 36* 35*  CREATININE 3.23* 3.04*    Recent Labs  02/08/15 0500  INR 1.24    Scheduled Meds: . acyclovir ointment  1 application Topical V3Z  . benzonatate  200 mg Oral TID  . bisoprolol  10 mg Oral BID  . chlorpheniramine-HYDROcodone  5 mL Oral Q12H  . diltiazem  180 mg Oral BID  . estrogens (conjugated)  0.3 mg Oral Q48H  . famotidine  20 mg Oral QHS  . sertraline  50 mg Oral Daily  . valACYclovir  1,000 mg Oral Daily     Cardiac Studies: Echo 01/25/15 Study Conclusions  - Left ventricle: The cavity size was normal. Wall thickness was normal. Systolic function was normal. The estimated ejection fraction was in the range of 55% to 60%. Wall motion was normal; there were no regional wall motion abnormalities. - Mitral  valve: Calcified annulus. There was mild regurgitation. - Left atrium: The atrium was mildly dilated. - Pulmonary arteries: Systolic pressure was mildly increased. PA peak pressure: 32 mm Hg (S).  Impressions:  - Normal LV function; mild LAE; mild MR and TR; mildly elevated pulmonary pressure.   Assessment/Plan:  1. Paroxysmal atrial fibrillation. Gibbs 3 for age, sex, HTN. She is in AF with VR 100-140 on Zebeta 10 mg and Diltiazem 180 mg BID now (increased 4/25) for the most part well rate controlled (<110).She has had an occasional 2 sec pause. No worsening with increased diltiazem.   2. Septic shock due to Group A strep cellulitis, erysipelas / chest wall induration with Streptococcus bacteremia  3. Acute renal failure. Transient HD this admission. SCr continues to slowly improve.   4. Anemia with positive stool- currently not on anticoagulants or ASA. Would recommend warfarin once cleared from GI.   No NOAC secondary to renal insufficiency and now anemia with heme positive stools.    02/08/2015 10:18 AM

## 2015-02-08 NOTE — Op Note (Signed)
[facility] [place], [id number]   ENDOSCOPY PROCEDURE REPORT  PATIENT: Amanda Wilkins, Amanda Wilkins  MR#: [id number] BIRTHDATE: [birth year] , 69  yrs. old GENDER: female ENDOSCOPIST: [name], MD REFERRED BY:  Tami Lin, M.D. PROCEDURE DATE:  02/08/2015 PROCEDURE:  EGD, diagnostic ASA CLASS:     Class II INDICATIONS:   anemia, and occult blood positive. MEDICATIONS: Versed 4 mg IV, and Fentanyl 25 mcg IV TOPICAL ANESTHETIC: Cetacaine Spray  DESCRIPTION OF PROCEDURE: After the risks benefits and alternatives of the procedure were thoroughly explained, informed consent was obtained.  The PENTAX GASTOROSCOPE [id number] endoscope was introduced through the mouth and advanced to the third portion of the duodenum , Without limitations.  The instrument was slowly withdrawn as the mucosa was fully examined.      EXAM: The esophagus and gastroesophageal junction were completely normal in appearance.  The stomach was entered and closely examined.The antrum, angularis, and lesser curvature were well visualized, including a retroflexed view of the cardia and fundus. The stomach wall was normally distensable.  The scope passed easily through the pylorus into the duodenum.  Retroflexed views revealed no abnormalities.     The scope was then withdrawn from the patient and the procedure completed.  COMPLICATIONS: There were no immediate complications.  ENDOSCOPIC IMPRESSION: Normal appearing esophagus and GE junction, the stomach was well visualized and normal in appearance, normal appearing duodenum  RECOMMENDATIONS: Followup hemeoccults t/c repeat colonoscopy, capsule study  REPEAT EXAM:  eSigned:  [name], MD 02/08/2015 1:12 PM    CC:

## 2015-02-08 NOTE — Progress Notes (Signed)
Utilization review completed.  

## 2015-02-08 NOTE — Progress Notes (Signed)
[facility]                                                                              Patient Demographics  Amanda Wilkins, is a 69 y.o. female, DOB - 1945/12/13, XLK:[id number]  Admit date - 01/22/2015   Admitting Physician Nelda Bucks, MD  Outpatient Primary MD for the patient is Sonda Primes, MD  LOS - 16   Chief Complaint  Patient presents with  . Irregular Heart Beat       Brief HPI   Patient is a 69 year old female with paroxysmal atrial fibrillation, hypertension, obesity presented with 2 day history of nausea, vomiting, generalized malaise, diarrhea, palpitations and fever. She had vomited several times and was unable to hold down her home medications. Lactic acid was 3.6 at the time of admission. CT chest showed inflammation in the right axilla extending inferiorly along the right chest but no focal evidence of abscess or gas. Patient was admitted to stepdown unit for further workup. BMET at the time of admission showed sodium of 127, creatinine 1.97, bicarbonate 16.   Assessment & Plan   Principal problem Septic shock due to Group A strep cellulitis, erysipelas / chest wall induration with Streptococcus bacteremia  - Improved The patient was admitted with acute viral gastroenteritis and lactic acidosis. CT chest showed inflammation in the right axilla extending inferiorly along the right chest, no focal evidence of abscess or gas. CT abdomen and pelvis showed no renal obstruction, gallbladder distention with possible gallstones.  Renal ultrasound showed no hydronephrosis, cholelithiasis however small left pleural effusion. Patient however became hypotensive, tachycardiac with respiratory distress and PCCM was consulted on 4/11. Patient required critical care support for vasopressors and was off the pressors on 4/13. HIDA scan showed a delayed filling of gallbladder, no evidence of biliary obstruction. MRI of the flank showed no focal pockets of  infection/abscess. ID, Dr. Luciana Axe signed off on 4/18, recommended to continue IV ceftriaxone through 4/24, patient has completed. - Patient had initially complained of some shoulder pain which has now resolved and per ID, no indication for MRI.  Active problems Anemia- normocytic, likely due to chronic disease:  - Stool occult test +, aspirin and heparin subcutaneous was discontinued. Patient was started on Coumadin on 4/22 however hemoglobin has been trending down.  - GI consulted, planning endoscopy today.  - Hemoglobin stable today  Persistent Atrial Fibrillation :  - Cardiology following, rate controlled, off of IV Cardizem drip  - CHADS2VASC 3 - cardiology recommending anticoagulation, not a candidate for NOAC due to renal failure. As above, Coumadin was started on 4/22 however hemoglobin continue to trend down and stool occult test positive, hence GI consulted. - Cardizem increased yesterday, continue beta blocker  ?Viral Gastroenteritis - Now resolved  Acute Kidney Injury - improving - Likely due to sepsis/septic shock, atrial fibrillation, hypotension, NSAIDs. - Nephrology following closely, HD catheter removed, Foley catheter removed - Outpatient follow-up with nephrology  Acute pulm edema - resolved  HSV1 exacerbation  Appears to be resolving - acyclovir initiated 4/19   Hyponatremia  -Resolved   Thrombocytopenia  Likely due to consumptive colopathy secondary to sepsis,  resolved    Abnormal Myoview  Obtained in March 2016 -reviewed cardiology recommendations, currently no chest pain, cardiac enzymes negative. Defer further evaluation due to renal and additional significant comorbidities.  Obesity - Body mass index is 36.77 kg/(m^2).  -  patient counseled on diet and weight control   High D-dimer  - doubt PE, CT angiogram not possible due to renal function,  Doppler lower extremity negative for DVT  - holding coumadin  Suspected OSA  Saturations remaining stable -  will need outpatient sleep study   Code Status:Full code  Family Communication: Discussed in detail with the patient, all imaging results, lab results explained to the patient    Disposition Plan:  Patient was declined by inpatient rehabilitation, she is considering between skilled nursing facility versus home  Time Spent in minutes   25 minutes   Procedures  CT Chest > inflammation in R axilla extending inferiorly along R chest, no focal evidence of abscess or gas, mild dependent atx CT Abd/Pelvis > no renal obs, Gallbladder distention with possible internal gallstone identified Renal US > no hydronephrosis, cholelithiasis, small L pleural effusion  LE Duplex > neg for DVT, RUE Duplex > neg for DVT  HIDA Scan > delayed filling of gallbladder, but the gallbladder fills after morphine administration, no evidence of biliary obstruction   Consults   Cardiology  ID Nephrology   DVT Prophylaxis SCDs, Coumadin on hold  Medications  Scheduled Meds: . acyclovir ointment  1 application Topical Q3H  . benzonatate  200 mg Oral TID  . bisoprolol  10 mg Oral BID  . chlorpheniramine-HYDROcodone  5 mL Oral Q12H  . diltiazem  180 mg Oral BID  . estrogens (conjugated)  0.3 mg Oral Q48H  . famotidine  20 mg Oral QHS  . sertraline  50 mg Oral Daily  . valACYclovir  1,000 mg Oral Daily   Continuous Infusions: . sodium chloride 20 mL/hr at 02/07/15 1727   PRN Meds:.acetaminophen **OR** acetaminophen, guaifenesin, levalbuterol, loperamide, magic mouthwash w/lidocaine, menthol-cetylpyridinium, ondansetron **OR** ondansetron (ZOFRAN) IV, sodium chloride, traMADol   Antibiotics   Anti-infectives    Start     Dose/Rate Route Frequency Ordered Stop   02/07/15 0000  valACYclovir (VALTREX) 1000 MG tablet     1,000 mg Oral Daily 02/07/15 0943     02/01/15 1200  valACYclovir (VALTREX) tablet 1,000 mg     1,000 mg Oral Daily 02/01/15 1120     01/26/15 1530  cefTRIAXone (ROCEPHIN) 2 g in  dextrose 5 % 50 mL IVPB - Premix     2 g 100 mL/hr over 30 Minutes Intravenous Every 24 hours 01/26/15 1505 02/06/15 1523   01/25/15 1200  vancomycin (VANCOCIN) 1,500 mg in sodium chloride 0.9 % 500 mL IVPB  Status:  Discontinued     1,500 mg 250 mL/hr over 120 Minutes Intravenous Every 48 hours 01/23/15 1417 01/25/15 1015   01/25/15 1100  clindamycin (CLEOCIN) IVPB 900 mg  Status:  Discontinued     900 mg 100 mL/hr over 30 Minutes Intravenous 3 times per day 01/25/15 1014 01/30/15 1310   01/24/15 1600  doxycycline (VIBRAMYCIN) 100 mg in dextrose 5 % 250 mL IVPB  Status:  Discontinued     100 mg 125 mL/hr over 120 Minutes Intravenous Every 12 hours 01/24/15 1500 01/25/15 1013   01/24/15 1400  piperacillin-tazobactam (ZOSYN) IVPB 2.25 g  Status:  Discontinued     2.25 g 100 mL/hr over 30 Minutes Intravenous 4 times per day 01/24/15 1148  01/26/15 1505   01/24/15 1000  vancomycin (VANCOCIN) IVPB 1000 mg/200 mL premix  Status:  Discontinued     1,000 mg 200 mL/hr over 60 Minutes Intravenous Every 24 hours 01/23/15 0846 01/23/15 1417   01/23/15 2200  piperacillin-tazobactam (ZOSYN) IVPB 3.375 g  Status:  Discontinued     3.375 g 12.5 mL/hr over 240 Minutes Intravenous Every 8 hours 01/23/15 0846 01/24/15 1148   01/23/15 1000  oseltamivir (TAMIFLU) capsule 30 mg  Status:  Discontinued     30 mg Oral 2 times daily 01/23/15 0803 01/23/15 1413   01/23/15 0900  vancomycin (VANCOCIN) 2,000 mg in sodium chloride 0.9 % 500 mL IVPB     2,000 mg 250 mL/hr over 120 Minutes Intravenous  Once 01/23/15 0846 01/23/15 1401   01/23/15 0845  piperacillin-tazobactam (ZOSYN) IVPB 3.375 g     3.375 g 100 mL/hr over 30 Minutes Intravenous  Once 01/23/15 0846 01/23/15 2013        Subjective:   Amanda Wilkins was seen and examined today. Coughing improving, no fevers or chills. Patient is frustrated with one test after another however also understands the process. Denies any chest pain, nausea, vomiting  and abdominal pain, diarrhea or constipation.   Objective:   Blood pressure 128/44, pulse 77, temperature 98.7 F (37.1 C), temperature source Oral, resp. rate 26, height 5\' 6"  (1.676 m), weight 103.8 kg (228 lb 13.4 oz), SpO2 91 %.  Wt Readings from Last 3 Encounters:  02/08/15 103.8 kg (228 lb 13.4 oz)  01/04/15 103.874 kg (229 lb)  12/16/14 102.513 kg (226 lb)     Intake/Output Summary (Last 24 hours) at 02/08/15 1311 Last data filed at 02/08/15 0730  Gross per 24 hour  Intake    260 ml  Output   2000 ml  Net  -1740 ml    Exam  General: Alert and oriented x 3, NAD  HEENT:  PERRLA, EOMI, Anicteic  Neck: Supple, no JVD  CVS: S1 S2 clear, RRR, no MRG  Respiratory: CTA B  Abdomen:  morbidly osoft, NT, ND, NBS  Ext: no cyanosis clubbing, trace edema  Neuro: AAOx3, Cr N's II- XII. Strength 5/5 upper and lower extremities bilaterally  Skin: No rashes  Psych: Normal affect and demeanor, alert and oriented x3    Data Review   Micro Results Recent Results (from the past 240 hour(s))  Clostridium Difficile by PCR     Status: None   Collection Time: 02/01/15  4:59 PM  Result Value Ref Range Status   C difficile by pcr NEGATIVE NEGATIVE Final    Radiology Reports Ct Abdomen Pelvis Wo Contrast  01/24/2015   CLINICAL DATA:  Abdominal pain, nausea, vomiting, diarrhea. Fever and chills. Sepsis.  EXAM: CT ABDOMEN AND PELVIS WITHOUT CONTRAST  TECHNIQUE: Multidetector CT imaging of the abdomen and pelvis was performed following the standard protocol without IV contrast.  COMPARISON:  No similar prior exam is available at this institution for comparison or on [facility].  FINDINGS: Lower chest: Small bilateral pleural effusions are noted. Interlobular septal thickening and bronchial wall thickening are suggestive of interstitial pulmonary edema.  Hepatobiliary: Hepatic hypodensity suggests steatosis. No focal hepatic abnormality or intrahepatic ductal dilatation. Gallbladder  distention with probable dependent stone image 34.  Pancreas: Normal  Spleen: Normal  Adrenals/Urinary Tract: Adrenal glands appear normal. Moderate nonspecific bilateral perinephric stranding is identified. No hydroureteronephrosis. A Foley catheter is in place and the bladder is decompressed. No radiopaque renal or ureteral calculus. Renal parenchymal  detail is obscured by lack of contrast.  Stomach/Bowel: Stomach appears normal. Normal appendix. No bowel wall thickening or focal segmental dilatation is identified.  Vascular/Lymphatic: No lymphadenopathy. Mild atheromatous aortic calcification identified.  Reproductive: Uterus and ovaries are unremarkable.  Other: Mild soft tissue anasarca is present which may suggest fluid overload. No free air or free intra-abdominal fluid.  Musculoskeletal: No acute osseous abnormality. L5-S1 disc degenerative change.  IMPRESSION: Findings at the lung bases suggest interstitial pulmonary edema with mild soft tissue anasarca.  Nonspecific bilateral perinephric stranding without hydroureteronephrosis or radiopaque renal or ureteral calculus.  Gallbladder distention with possible internal gallstone identified. This again raises the question of possible cholecystitis in the setting of sepsis.   Electronically Signed   By: Christiana Pellant M.D.   On: 01/24/2015 21:04   Dg Chest 2 View  01/22/2015   CLINICAL DATA:  Atrial fibrillation. Shortness of breath with exertion. Right axillary pain. No injury.  EXAM: CHEST  2 VIEW  COMPARISON:  09/22/2014  FINDINGS: Normal heart size and pulmonary vascularity. Perihilar interstitial changes suggesting chronic bronchitis. Linear atelectasis or scarring in the lung bases. No focal consolidation. No blunting of costophrenic angles. No pneumothorax. Mediastinal contours appear intact. Calcified aorta. Mild degenerative changes in the spine.  IMPRESSION: Chronic bronchitic changes in the lungs with slight fibrosis or linear atelectasis in the  lung bases. No focal consolidation.   Electronically Signed   By: Burman Nieves M.D.   On: 01/22/2015 23:46   Ct Chest Wo Contrast  01/23/2015   CLINICAL DATA:  69 year old female with right-sided chest and axillary pain. Sepsis. Fever for 1-2 weeks.  EXAM: CT CHEST WITHOUT CONTRAST  TECHNIQUE: Multidetector CT imaging of the chest was performed following the standard protocol without IV contrast.  COMPARISON:  01/22/2015 chest radiograph. Chest CT report dated 09/22/2014. Images are not available at this time due to PACS downtime.  FINDINGS: Mediastinum/Nodes: The heart and great vessels are unremarkable. There is no evidence of thoracic aortic aneurysm. No pleural or pericardial effusions are identified. No enlarged lymph nodes are noted.  Lungs/Pleura: Mild dependent and basilar atelectasis noted. There is no evidence of airspace disease, consolidation, suspicious nodule, mass or endobronchial/endotracheal lesion.  Upper abdomen: Hepatic steatosis identified.  Musculoskeletal: Right axillary inflammation is identified extending inferiorly along the right chest. There is no evidence of gas or abscess. No acute or suspicious bony abnormalities are identified.  IMPRESSION: Inflammation in the right axilla extending inferiorly along the right chest -suspicious for infection given clinical history. No evidence of focal abscess or gas.  Mild dependent/basilar atelectasis.   Electronically Signed   By: Harmon Pier M.D.   On: 01/23/2015 11:12   Mr Abdomen Wo Contrast  01/29/2015   CLINICAL DATA:  Septic shock. Evaluate subcutaneous soft tissue swelling/ edema involving the abdominal wall.  EXAM: MRI ABDOMEN WITHOUT CONTRAST  TECHNIQUE: Multiplanar multisequence MR imaging was performed without the administration of intravenous contrast.  COMPARISON:  CT scan 01/24/2015  FINDINGS: There is diffuse edema like signal abnormality in the subcutaneous fat involving the entire abdominal wall more significantly on the  right side. I do not see a discrete drainable fluid collection to suggest an abscess. No definite involvement of the abdominal muscles. There is however involvement of the latissimus dorsi muscle on the right side.  Intra-abdominal findings include bowel dilatation and cholelithiasis. No large intra-abdominal fluid collections are identified.  IMPRESSION: Diffuse subcutaneous soft tissue swelling/ edema/fluid likely reflecting cellulitis. There is also myositis involving the right  latissimus dorsi muscle. No focal drainable fluid collection/abscess is identified.  Bowel dilatation is noted. No significant intra abdominal fluid collection is identified.  Gallbladder distention and cholelithiasis.   Electronically Signed   By: Rudie Meyer M.D.   On: 01/29/2015 12:56   Nm Hepatobiliary Including Gb  01/26/2015   CLINICAL DATA:  Gallstones.  Distended gallbladder on prior CT.  EXAM: NUCLEAR MEDICINE HEPATOBILIARY IMAGING  TECHNIQUE: Sequential images of the abdomen were obtained out to 60 minutes following intravenous administration of radiopharmaceutical.  RADIOPHARMACEUTICALS:  5.0 Millicurie Tc-23m Choletec  COMPARISON:  CT 01/24/2015  FINDINGS: Prompt uptake and excretion of radiotracer by the liver. Activity is seen in small bowel by 30 minutes. No activity noted in the gallbladder at 90 minutes. Therefore, 4 mg of morphine was administered intravenously. Continued imaging demonstrates filling of the gallbladder post morphine.  IMPRESSION: Delayed filling of the gallbladder, but the gallbladder fills after morphine administration compatible with patent cystic duct. No evidence of biliary obstruction.   Electronically Signed   By: Charlett Nose M.D.   On: 01/26/2015 14:28   [place] Chest  01/25/2015   CLINICAL DATA:  69 year old female with right axillary and chest wall pain. Fever and sepsis. Initial encounter.  EXAM: CHEST ULTRASOUND  COMPARISON:  Chest CT 01/23/2015.  FINDINGS: Ultrasound imaging in the area of  clinical concern corresponding to the right axilla and chest wall.  Lymph nodes with normal fatty hila. Diffuse subcutaneous edema. Evidence of edematous chest wall muscle (image 19) with separation of muscle fibers, but no discrete or drainable fluid collection.  IMPRESSION: Subcutaneous and chest wall muscle edema with no discrete or drainable fluid collection. Regional lymph nodes remain normal.   Electronically Signed   By: Odessa Fleming M.D.   On: 01/25/2015 12:11   US Renal  01/24/2015   CLINICAL DATA:  Acute renal insufficiency.  EXAM: RENAL/URINARY TRACT ULTRASOUND COMPLETE  COMPARISON:  11/16/2009  FINDINGS: Right Kidney:  Length: 10.1 cm. Echogenicity within normal limits. No mass or hydronephrosis visualized.  Left Kidney:  Length: 10.4 cm. No hydronephrosis. Mild perinephric edema or trace fluid adjacent the lower pole left kidney.  Bladder:  Collapsed around a Foley catheter.  Exam degraded by patient body habitus. Incidental note is made of a 2.6 cm gallstone and a left-sided pleural effusion.  IMPRESSION: 1.  No hydronephrosis.  No explanation for renal insufficiency. 2. Decreased sensitivity and specificity exam due to technique related factors, as described above. 3. Cholelithiasis. 4. Small left pleural effusion.   Electronically Signed   By: Jeronimo Greaves M.D.   On: 01/24/2015 10:56   Dg Chest Port 1 View  01/28/2015   CLINICAL DATA:  Central line placement.  EXAM: PORTABLE CHEST - 1 VIEW  COMPARISON:  01/28/2015.  FINDINGS: The right IJ central venous catheter tip is in the proximal SVC above the level of the carina. No complicating features are demonstrated. Persistent bibasilar atelectasis. Possible small right effusion.  IMPRESSION: Right IJ central venous catheter tip is in the proximal SVC. No complicating features.  Persistent bibasilar atelectasis and possible small right effusion.   Electronically Signed   By: Rudie Meyer M.D.   On: 01/28/2015 15:25   Dg Chest Port 1 View  01/28/2015    CLINICAL DATA:  Acute respiratory failure.  EXAM: PORTABLE CHEST - 1 VIEW  COMPARISON:  January 27, 2015.  FINDINGS: Stable cardiomediastinal silhouette. No pneumothorax is noted. Left lung is clear. New right basilar opacity is noted concerning for pneumonia  or atelectasis, with possible associated pleural effusion. Left-sided PICC line is noted with distal tip overlying expected position of the SVC.  IMPRESSION: New right basilar opacity is noted concerning for pneumonia or atelectasis with associated pleural effusion. Followup radiographs are recommended.   Electronically Signed   By: Lupita Raider, M.D.   On: 01/28/2015 08:26   Dg Chest Port 1 View  01/27/2015   CLINICAL DATA:  69 year old female with sepsis, septic shock. Initial encounter.  EXAM: PORTABLE CHEST - 1 VIEW  COMPARISON:  01/24/2015 and earlier.  FINDINGS: Portable AP upright view at 0802 hours. Stable left PICC line. Mildly improved lung volumes and bibasilar ventilation. Bilateral increased interstitial markings have regressed and now appear at baseline compared to 2015 Ing stands. No pneumothorax. No pleural effusion or consolidation. Stable cardiac size and mediastinal contours.  IMPRESSION: Regressed bilateral interstitial opacity, lung parenchyma now all appears at baseline compared to 2015 studies. No new cardiopulmonary abnormality.   Electronically Signed   By: Odessa Fleming M.D.   On: 01/27/2015 08:08   Dg Chest Port 1 View  01/24/2015   CLINICAL DATA:  Wheezing and shortness of breath.  EXAM: PORTABLE CHEST - 1 VIEW  COMPARISON:  01/24/2015  FINDINGS: Left-sided PICC line tip overlies the superior vena cava. Heart is mildly prominent. There Kerley B-lines consistent with pulmonary edema. No focal consolidation. No overt alveolar edema.  IMPRESSION: 1. Mild interstitial edema. 2. Left-sided PICC line tip to the superior vena cava.   Electronically Signed   By: Norva Pavlov M.D.   On: 01/24/2015 22:14   Dg Chest Port 1  View  01/24/2015   CLINICAL DATA:  Hypoxia, pt was admitted with an acute URI and right armpit infection and was found to be in a-fib with RVR, hypovolemic, hyponatremic with an acute renal failure.  EXAM: PORTABLE CHEST - 1 VIEW  COMPARISON:  Radiograph 01/23/2015  FINDINGS: Left-sided PICC line with tip in distal SVC. Stable cardiac silhouette. There is mild central venous congestion. No focal infiltrate. No pulmonary edema. Mild basilar atelectasis.  IMPRESSION: Mild basilar atelectasis and minimal central venous congestion. No change from prior.   Electronically Signed   By: Genevive Bi M.D.   On: 01/24/2015 09:44   Dg Chest Port 1 View  01/23/2015   CLINICAL DATA:  Shortness of breath.  EXAM: PORTABLE CHEST - 1 VIEW  COMPARISON:  Same day.  FINDINGS: The heart size and mediastinal contours are within normal limits. Both lungs are clear. No pneumothorax or pleural effusion is noted. Left-sided PICC line is noted with tip in expected position of cavoatrial junction. The visualized skeletal structures are unremarkable.  IMPRESSION: No acute cardiopulmonary abnormality seen.   Electronically Signed   By: Lupita Raider, M.D.   On: 01/23/2015 18:52   Dg Chest Port 1 View  01/23/2015   CLINICAL DATA:  PICC line placement  EXAM: PORTABLE CHEST - 1 VIEW  COMPARISON:  CT scan of the chest same day  FINDINGS: Cardiomediastinal silhouette is unremarkable. No acute infiltrate or pulmonary edema. There is a left arm PICC line with tip in right atrium. For distal SVC position the PICC line should be retracted about 1 cm. No pneumothorax.  IMPRESSION: Left arm PICC line with tip in right atrium. For distal SVC position PICC line should be retracted about 1 cm. No pneumothorax.   Electronically Signed   By: Natasha Mead M.D.   On: 01/23/2015 17:45   US Abdomen Limited Ruq  01/25/2015   CLINICAL DATA:  Upper abdominal pain  EXAM: US ABDOMEN LIMITED - RIGHT UPPER QUADRANT  COMPARISON:  None.  FINDINGS: Gallbladder:   There is a a focal gallstone measuring 1.9 cm in length which moves and shadows. Gallbladder wall is not thickened, and there is no pericholecystic fluid. No sonographic Murphy sign noted.  Common bile duct:  Diameter: 5 mm. No intrahepatic or extrahepatic biliary duct dilatation.  Liver:  No focal lesion identified. Liver echogenicity is diffusely increased.  There is a small right pleural effusion.  IMPRESSION: Cholelithiasis. Increased liver echogenicity, most likely due to hepatic steatosis. While no focal liver lesions are identified, it must be cautioned that the sensitivity of ultrasound for focal liver lesions is diminished in this circumstance. Small right pleural effusion noted.   Electronically Signed   By: Bretta Bang III M.D.   On: 01/25/2015 12:15    CBC  Recent Labs Lab 02/02/15 0230  02/04/15 0440 02/05/15 0440 02/06/15 0603 02/07/15 0550 02/08/15 1135  WBC 11.3*  < > 8.4 7.5 6.9 5.9 5.6  HGB 9.3*  < > 9.1* 8.8* 8.4* 8.3* 8.4*  HCT 26.6*  < > 26.2* 25.6* 25.1* 24.6* 25.3*  PLT 211  < > 264 268 240 234 261  MCV 96.0  < > 97.0 97.7 99.6 99.6 98.8  MCH 33.6  < > 33.7 33.6 33.3 33.6 32.8  MCHC 35.0  < > 34.7 34.4 33.5 33.7 33.2  RDW 13.8  < > 14.0 14.0 14.2 14.1 14.2  LYMPHSABS 1.8  --   --   --   --   --   --   MONOABS 0.5  --   --   --   --   --   --   EOSABS 0.1  --   --   --   --   --   --   BASOSABS 0.0  --   --   --   --   --   --   < > = values in this interval not displayed.  Chemistries   Recent Labs Lab 02/02/15 0230  02/04/15 0440 02/05/15 0440 02/06/15 0603 02/07/15 1043 02/08/15 0500  NA 130*  < > 133* 135 135 136 139  K 3.0*  < > 3.3* 3.6 4.0 4.0 4.0  CL 91*  < > 94* 97 99 102 105  CO2 27  < > 27 25 25 23 23   GLUCOSE 110*  < > 109* 108* 109* 129* 108*  BUN 37*  < > 39* 40* 37* 36* 35*  CREATININE 3.93*  < > 3.77* 3.65* 3.43* 3.23* 3.04*  CALCIUM 8.1*  < > 8.2* 8.6 8.6 9.0 8.8  MG 1.6  --   --   --   --   --   --   AST 23  --   --   --    --   --   --   ALT 23  --   --   --   --   --   --   ALKPHOS 57  --   --   --   --   --   --   BILITOT 0.5  --   --   --   --   --   --   < > = values in this interval not displayed. ------------------------------------------------------------------------------------------------------------------ estimated creatinine clearance is 21.6 mL/min (by C-G formula based on Cr of 3.04). ------------------------------------------------------------------------------------------------------------------ No results for input(s): HGBA1C  in the last 72 hours. ------------------------------------------------------------------------------------------------------------------ No results for input(s): CHOL, HDL, LDLCALC, TRIG, CHOLHDL, LDLDIRECT in the last 72 hours. ------------------------------------------------------------------------------------------------------------------ No results for input(s): TSH, T4TOTAL, T3FREE, THYROIDAB in the last 72 hours.  Invalid input(s): FREET3 ------------------------------------------------------------------------------------------------------------------ No results for input(s): VITAMINB12, FOLATE, FERRITIN, TIBC, IRON, RETICCTPCT in the last 72 hours.  Coagulation profile  Recent Labs Lab 02/05/15 0440 02/06/15 0603 02/07/15 0550 02/08/15 0500  INR 1.00 1.29 1.34 1.24    No results for input(s): DDIMER in the last 72 hours.  Cardiac Enzymes No results for input(s): CKMB, TROPONINI, MYOGLOBIN in the last 168 hours.  Invalid input(s): CK ------------------------------------------------------------------------------------------------------------------ Invalid input(s): POCBNP  No results for input(s): GLUCAP in the last 72 hours.   Bintou Lafata M.D. [facility] 02/08/2015, 1:11 PM  Pager: [phone]   Between 7am to 7pm - call Pager - [phone]  After 7pm go to [email or web address] - password [email or web address]  Call night coverage person covering after  7pm

## 2015-02-08 NOTE — Progress Notes (Signed)
ANTICOAGULATION CONSULT NOTE - Follow Up Consult  Pharmacy Consult for Coumadin Indication: atrial fibrillation  Allergies  Allergen Reactions  . Epinephrine Other (See Comments)    Heart rate soars  . Epinephrine Base Other (See Comments)    Seriously increases heart rate  . Aspirin Other (See Comments)    Can take the coated 325 mg. Plain asa 325 mg speeds up the heart.  . Penicillins Other (See Comments)    Pt previously has been told not to take because of family history of reactions (water blisters). She took amoxicillin in 2012-2013 with no reaction  . Tape Other (See Comments)    Blisters from adhesive tape    Patient Measurements: Height: 5\' 6"  (167.6 cm) Weight: 228 lb 13.4 oz (103.8 kg) IBW/kg (Calculated) : 59.3  Vital Signs: Temp: 98.5 F (36.9 C) (04/26 1357) Temp Source: Oral (04/26 1357) BP: 116/60 mmHg (04/26 1357) Pulse Rate: 85 (04/26 1357)  Labs:  Recent Labs  02/06/15 0603 02/07/15 0550 02/07/15 1043 02/08/15 0500 02/08/15 1135  HGB 8.4* 8.3*  --   --  8.4*  HCT 25.1* 24.6*  --   --  25.3*  PLT 240 234  --   --  261  LABPROT 16.2* 16.7*  --  15.7*  --   INR 1.29 1.34  --  1.24  --   CREATININE 3.43*  --  3.23* 3.04*  --     Estimated Creatinine Clearance: 21.6 mL/min (by C-G formula based on Cr of 3.04).   Medications:  Scheduled:  . acyclovir ointment  1 application Topical B3Z  . benzonatate  200 mg Oral TID  . bisoprolol  10 mg Oral BID  . chlorpheniramine-HYDROcodone  5 mL Oral Q12H  . diltiazem  180 mg Oral BID  . estrogens (conjugated)  0.3 mg Oral Q48H  . famotidine  20 mg Oral QHS  . sertraline  50 mg Oral Daily  . valACYclovir  1,000 mg Oral Daily    Assessment: 69 yo F with afib (CHADSVASC = 3) who started Coumadin on 4/22.  Coumadin was held on 4/23 after Hgb decreased and pt was FOB+.  Pt is now s/p GI eval and ok to restart Coumadin with close monitoring.  INR 1.24 today.    Goal of Therapy:  INR 2-3 Monitor  platelets by anticoagulation protocol: Yes   Plan:  Coumadin 5mg  PO x 1 tonight Continue daily INR Monitor CBC closely for potential bleed  [employer], Pharm.D., BCPS Clinical Pharmacist Pager [phone] 02/08/2015 4:02 PM

## 2015-02-08 NOTE — Clinical Social Work Note (Signed)
CSW spoke with pt at bedside concerning SNF vs home health.  Pt is agreeable to SNF only if she can go to [facility]- if unable to get bed at [facility] then pt chooses to go home.  [facility] has cleared the pt medically and currently has beds available.  CSW will continue to follow.  Domenica Reamer, [place] Social Worker [phone]

## 2015-02-09 ENCOUNTER — Encounter (HOSPITAL_COMMUNITY): Payer: Self-pay | Admitting: Gastroenterology

## 2015-02-09 ENCOUNTER — Telehealth: Payer: Self-pay | Admitting: Internal Medicine

## 2015-02-09 DIAGNOSIS — J9600 Acute respiratory failure, unspecified whether with hypoxia or hypercapnia: Secondary | ICD-10-CM | POA: Diagnosis not present

## 2015-02-09 DIAGNOSIS — R531 Weakness: Secondary | ICD-10-CM | POA: Diagnosis not present

## 2015-02-09 DIAGNOSIS — I4891 Unspecified atrial fibrillation: Secondary | ICD-10-CM | POA: Diagnosis not present

## 2015-02-09 DIAGNOSIS — I480 Paroxysmal atrial fibrillation: Secondary | ICD-10-CM | POA: Diagnosis not present

## 2015-02-09 DIAGNOSIS — Z5189 Encounter for other specified aftercare: Secondary | ICD-10-CM | POA: Diagnosis not present

## 2015-02-09 DIAGNOSIS — R262 Difficulty in walking, not elsewhere classified: Secondary | ICD-10-CM | POA: Diagnosis not present

## 2015-02-09 DIAGNOSIS — Z79890 Hormone replacement therapy (postmenopausal): Secondary | ICD-10-CM | POA: Diagnosis not present

## 2015-02-09 DIAGNOSIS — R0600 Dyspnea, unspecified: Secondary | ICD-10-CM | POA: Diagnosis not present

## 2015-02-09 DIAGNOSIS — B009 Herpesviral infection, unspecified: Secondary | ICD-10-CM | POA: Diagnosis not present

## 2015-02-09 DIAGNOSIS — R5381 Other malaise: Secondary | ICD-10-CM | POA: Diagnosis not present

## 2015-02-09 DIAGNOSIS — N179 Acute kidney failure, unspecified: Secondary | ICD-10-CM | POA: Diagnosis not present

## 2015-02-09 DIAGNOSIS — I482 Chronic atrial fibrillation: Secondary | ICD-10-CM | POA: Diagnosis not present

## 2015-02-09 DIAGNOSIS — G43709 Chronic migraine without aura, not intractable, without status migrainosus: Secondary | ICD-10-CM | POA: Diagnosis not present

## 2015-02-09 DIAGNOSIS — N172 Acute kidney failure with medullary necrosis: Secondary | ICD-10-CM | POA: Diagnosis not present

## 2015-02-09 DIAGNOSIS — D6959 Other secondary thrombocytopenia: Secondary | ICD-10-CM | POA: Diagnosis not present

## 2015-02-09 DIAGNOSIS — N189 Chronic kidney disease, unspecified: Secondary | ICD-10-CM | POA: Diagnosis not present

## 2015-02-09 DIAGNOSIS — A427 Actinomycotic sepsis: Secondary | ICD-10-CM | POA: Diagnosis not present

## 2015-02-09 DIAGNOSIS — A409 Streptococcal sepsis, unspecified: Secondary | ICD-10-CM | POA: Diagnosis not present

## 2015-02-09 DIAGNOSIS — R6521 Severe sepsis with septic shock: Secondary | ICD-10-CM | POA: Diagnosis not present

## 2015-02-09 DIAGNOSIS — B950 Streptococcus, group A, as the cause of diseases classified elsewhere: Secondary | ICD-10-CM | POA: Diagnosis not present

## 2015-02-09 DIAGNOSIS — B007 Disseminated herpesviral disease: Secondary | ICD-10-CM | POA: Diagnosis not present

## 2015-02-09 DIAGNOSIS — D631 Anemia in chronic kidney disease: Secondary | ICD-10-CM | POA: Diagnosis not present

## 2015-02-09 DIAGNOSIS — M6281 Muscle weakness (generalized): Secondary | ICD-10-CM | POA: Diagnosis not present

## 2015-02-09 LAB — CBC
HCT: 24.4 % — ABNORMAL LOW (ref 36.0–46.0)
Hemoglobin: 8.1 g/dL — ABNORMAL LOW (ref 12.0–15.0)
MCH: 33.2 pg (ref 26.0–34.0)
MCHC: 33.2 g/dL (ref 30.0–36.0)
MCV: 100 fL (ref 78.0–100.0)
PLATELETS: 237 10*3/uL (ref 150–400)
RBC: 2.44 MIL/uL — AB (ref 3.87–5.11)
RDW: 14.4 % (ref 11.5–15.5)
WBC: 5.7 10*3/uL (ref 4.0–10.5)

## 2015-02-09 LAB — BASIC METABOLIC PANEL
Anion gap: 9 (ref 5–15)
BUN: 31 mg/dL — AB (ref 6–23)
CALCIUM: 8.6 mg/dL (ref 8.4–10.5)
CHLORIDE: 104 mmol/L (ref 96–112)
CO2: 22 mmol/L (ref 19–32)
CREATININE: 2.81 mg/dL — AB (ref 0.50–1.10)
GFR, EST AFRICAN AMERICAN: 19 mL/min — AB (ref >90)
GFR, EST NON AFRICAN AMERICAN: 16 mL/min — AB (ref >90)
GLUCOSE: 104 mg/dL — AB (ref 70–99)
Potassium: 3.6 mmol/L (ref 3.5–5.1)
SODIUM: 135 mmol/L (ref 135–145)

## 2015-02-09 LAB — GLUCOSE, CAPILLARY
GLUCOSE-CAPILLARY: 114 mg/dL — AB (ref 70–99)
Glucose-Capillary: 121 mg/dL — ABNORMAL HIGH (ref 70–99)
Glucose-Capillary: 115 mg/dL — ABNORMAL HIGH (ref 70–99)

## 2015-02-09 LAB — PROTIME-INR
INR: 1.29 (ref 0.00–1.49)
Prothrombin Time: 16.3 seconds — ABNORMAL HIGH (ref 11.6–15.2)

## 2015-02-09 MED ORDER — WARFARIN SODIUM 7.5 MG PO TABS
7.5000 mg | ORAL_TABLET | Freq: Once | ORAL | Status: AC
  Administered 2015-02-09: 7.5 mg via ORAL
  Filled 2015-02-09 (×2): qty 1

## 2015-02-09 MED ORDER — WARFARIN SODIUM 5 MG PO TABS: ORAL_TABLET | ORAL | Status: DC

## 2015-02-09 MED ORDER — HYDROCOD POLST-CPM POLST ER 10-8 MG/5ML PO SUER: 5.0000 mL | Freq: Two times a day (BID) | ORAL | Status: DC | PRN

## 2015-02-09 NOTE — Progress Notes (Signed)
Patient will discharge to [facility] Anticipated discharge date:02/09/15 Family notified: pt husband Transportation by [employer]- scheduled for 3:30pm  CSW signing off.  Domenica Reamer, [place] Social Worker [phone]

## 2015-02-09 NOTE — Progress Notes (Signed)
Spoke with patient regarding plan of care. Advised her that further testing would be deferred until she has more complete recovery from this acute illness. Made her a follow-up appoint with Dr. [name] in early June. Discuss plans for further testing at that time.  Rosaria Ferries, PA-C 02/09/2015 2:20 PM Beeper [phone]

## 2015-02-09 NOTE — Telephone Encounter (Signed)
New Message  Pt calling to speak w/ Rn about current condition and what to do going forward. Wants OV w/ Dr. [name] in reference to test results and to consult [facility]- currently admitted to [facility]. Please call back and dsicuss.

## 2015-02-09 NOTE — Progress Notes (Signed)
CSW spoke with pt at bedside concerning SNF vs home health.  Pt is agreeable to meeting with [facility] admissions coordinator to to paperwork in case she decides SNF but would like to talk to her husband before making a final decision.  Pt inquired about going home for a week and going to SNF if she is not doing well at home- CSW explained that the process to get her in SNF would likely take a few days which would be critical if she did not get enough help at home.  CSW spoke with [place] who will do paperwork with pt and informed her of pt questions about going home first- [place] will address with patient.  CSW will continue to follow.  Domenica Reamer, [place] Social Worker [phone]

## 2015-02-09 NOTE — Progress Notes (Addendum)
Subjective:  Doing well. Denies CP or SOB.     Objective:  Vital Signs in the last 24 hours: Temp:  [97.6 F (36.4 C)-98.7 F (37.1 C)] 97.6 F (36.4 C) (04/27 0524) Pulse Rate:  [63-149] 65 (04/27 0524) Resp:  [14-29] 18 (04/27 0524) BP: (111-185)/(44-158) 115/67 mmHg (04/27 0524) SpO2:  [88 %-100 %] 100 % (04/27 0524) FiO2 (%):  [93 %] 93 % (04/26 1326) Weight:  [229 lb 0.9 oz (103.9 kg)] 229 lb 0.9 oz (103.9 kg) (04/27 0524)  Intake/Output from previous day:  Intake/Output Summary (Last 24 hours) at 02/09/15 0938 Last data filed at 02/09/15 0500  Gross per 24 hour  Intake    620 ml  Output   1250 ml  Net   -630 ml    Physical Exam:  Gen: laying in bed no distress Respiratory: CTA bilaterally. Breathing unlabored.  Heart: irregularly irregular rhythm Abd: overweight Neuro: intact, AAO x 3   Rate: 100-140 (on avg < 110)  Rhythm: atrial fibrillation  Lab Results:  CBC Latest Ref Rng 02/09/2015 02/08/2015 02/07/2015  WBC 4.0 - 10.5 K/uL 5.7 5.6 5.9  Hemoglobin 12.0 - 15.0 g/dL 8.1(L) 8.4(L) 8.3(L)  Hematocrit 36.0 - 46.0 % 24.4(L) 25.3(L) 24.6(L)  Platelets 150 - 400 K/uL 237 261 234     Recent Labs  02/08/15 0500 02/09/15 0425  NA 139 135  K 4.0 3.6  CL 105 104  CO2 23 22  GLUCOSE 108* 104*  BUN 35* 31*  CREATININE 3.04* 2.81*    Recent Labs  02/09/15 0425  INR 1.29    Scheduled Meds: . acyclovir ointment  1 application Topical Y0V  . benzonatate  200 mg Oral TID  . bisoprolol  10 mg Oral BID  . chlorpheniramine-HYDROcodone  5 mL Oral Q12H  . diltiazem  180 mg Oral BID  . estrogens (conjugated)  0.3 mg Oral Q48H  . famotidine  20 mg Oral QHS  . sertraline  50 mg Oral Daily  . valACYclovir  1,000 mg Oral Daily  . Warfarin - Pharmacist Dosing Inpatient   Does not apply q1800     Cardiac Studies: Echo 01/25/15 Study Conclusions  - Left ventricle: The cavity size was normal. Wall thickness was normal. Systolic function was normal.  The estimated ejection fraction was in the range of 55% to 60%. Wall motion was normal; there were no regional wall motion abnormalities. - Mitral valve: Calcified annulus. There was mild regurgitation. - Left atrium: The atrium was mildly dilated. - Pulmonary arteries: Systolic pressure was mildly increased. PA peak pressure: 32 mm Hg (S).  Impressions:  - Normal LV function; mild LAE; mild MR and TR; mildly elevated pulmonary pressure.   Assessment/Plan:  1. Paroxysmal atrial fibrillation. [place] 3 for age, sex, HTN. She is in AF with VR 100-140 on Zebeta 10 mg and Diltiazem 180 mg BID now (increased 4/25) for the most part well rate controlled (<110). No worsening with increased diltiazem.   2. Septic shock due to Group A strep cellulitis, erysipelas / chest wall induration with Streptococcus bacteremia  3. Acute renal failure. Transient HD this admission. SCr continues to slowly improve.   4. Anemia with positive stool - No abnormality on EGD. Restart Coumadin per pharmacy. GI note reviewed. GI will f/u for further workup.   No NOAC secondary to renal insufficiency.    02/09/2015 9:38 AM   Personally seen and examined. Agree with above. Irreg Irreg AFIB under better rate control.  No  changes today  Warfarin restarted. GI note reviewed, OK to restart.  OK to DC from cardiac perspective. She can follow up with Dr. [name] to discuss plans for possible future cardiac testing.     Candee Furbish, MD

## 2015-02-09 NOTE — Discharge Summary (Signed)
Physician Discharge Summary  Amanda Wilkins MRN: [id number] DOB/AGE: Oct 28, 1945 69 y.o.  PCP: [name], MD   Admit date: 01/22/2015 Discharge date: 02/09/2015  Discharge Diagnoses:     Principal Problem:   Atrial fibrillation with RVR Active Problems:   Palpitations   Upper respiratory disease   Dehydration   Breast pain, right   A-fib   Fever   Nausea & vomiting   Sepsis   Acute renal insufficiency   Abdominal pain   Axillary pain   Dyspnea   Hypoxia   Thrombocytopenia   Transaminitis   Hyperbilirubinemia   FUO (fever of unknown origin)   Septic shock   AKI (acute kidney injury)   Cholecystitis   DIC (disseminated intravascular coagulation)   Group A streptococcal infection   Cellulitis   D-dimer, elevated   Severe sepsis with septic shock   Acute respiratory failure   HSV-1 (herpes simplex virus 1) infection   Gallstones   Shoulder pain   Septic shock due to streptococcal infection   Atelectasis   Acute pulmonary edema   Persistent atrial fibrillation   Acute kidney injury   Hyponatremia   Hypokalemia   Hyperglycemia   Elevated d-dimer   OSA (obstructive sleep apnea)   Absolute anemia  Follow-up recommendations Follow-up with PCP in 3-5 days Follow-up with cardiology in one week Please check INR every day 1 week and then twice a week Pharmacy to adjust Coumadin dosing based on INR Follow-up CBC, CMP weekly    Medication List    STOP taking these medications        aspirin EC 325 MG tablet     atenolol 50 MG tablet  Commonly known as:  TENORMIN     celecoxib 200 MG capsule  Commonly known as:  CELEBREX     clarithromycin 500 MG 24 hr tablet  Commonly known as:  BIAXIN XL     DILT-XR 180 MG 24 hr capsule  Generic drug:  diltiazem     nitrofurantoin (macrocrystal-monohydrate) 100 MG capsule  Commonly known as:  MACROBID      TAKE these medications        acyclovir ointment 5 %  Commonly known as:  ZOVIRAX  Apply 1  application topically every 3 (three) hours. Apply to lips for 10days or until healed     benzonatate 200 MG capsule  Commonly known as:  TESSALON  Take 1 capsule (200 mg total) by mouth 3 (three) times daily.     bisoprolol 10 MG tablet  Commonly known as:  ZEBETA  Take 1 tablet (10 mg total) by mouth 2 (two) times daily.     chlorpheniramine-HYDROcodone 10-8 MG/5ML Suer  Commonly known as:  TUSSIONEX  Take 5 mLs by mouth every 12 (twelve) hours as needed for cough.     diltiazem 180 MG 24 hr capsule  Commonly known as:  CARDIZEM CD  Take 1 capsule (180 mg total) by mouth 2 (two) times daily.     estrogens (conjugated) 0.3 MG tablet  Commonly known as:  PREMARIN  Take daily     glycopyrrolate 1 MG tablet  Commonly known as:  ROBINUL  Take 1-2 mg by mouth 2 (two) times daily. Take 2 tablets (2 mg) every morning and 1 tablet (1 mg) every night     loperamide 2 MG capsule  Commonly known as:  IMODIUM  Take 1 capsule (2 mg total) by mouth 3 (three) times daily as needed for diarrhea or loose stools.  magic mouthwash w/lidocaine Soln  Take 5 mLs by mouth 3 (three) times daily as needed for mouth pain.     medroxyPROGESTERone 10 MG tablet  Commonly known as:  PROVERA  TAKE ONE TABLET 10 DAYS OF THE MONTH     menthol-cetylpyridinium 3 MG lozenge  Commonly known as:  CEPACOL  Take 1 lozenge (3 mg total) by mouth as needed for sore throat.     mupirocin ointment 2 %  Commonly known as:  BACTROBAN  Applied twice a day to the affected area;NOT into eyes.     sertraline 50 MG tablet  Commonly known as:  ZOLOFT  Take 1 tablet (50 mg total) by mouth daily.     traMADol 50 MG tablet  Commonly known as:  ULTRAM  Take 1 tablet (50 mg total) by mouth every 12 (twelve) hours as needed for moderate pain.     valACYclovir 1000 MG tablet  Commonly known as:  VALTREX  Take 1 tablet (1,000 mg total) by mouth daily. X 14days     Vitamin D 2000 UNITS tablet  Take 2,000 Units by  mouth daily.     warfarin 5 MG tablet  Commonly known as:  COUMADIN  5 mg by mouth daily, except Thursday Saturday and Sunday        Discharge Condition: Stable  Disposition: SNF    Consults:  Consults  Cardiology  ID Nephrology    Significant Diagnostic Studies: Ct Abdomen Pelvis Wo Contrast  01/24/2015   CLINICAL DATA:  Abdominal pain, nausea, vomiting, diarrhea. Fever and chills. Sepsis.  EXAM: CT ABDOMEN AND PELVIS WITHOUT CONTRAST  TECHNIQUE: Multidetector CT imaging of the abdomen and pelvis was performed following the standard protocol without IV contrast.  COMPARISON:  No similar prior exam is available at this institution for comparison or on [facility].  FINDINGS: Lower chest: Small bilateral pleural effusions are noted. Interlobular septal thickening and bronchial wall thickening are suggestive of interstitial pulmonary edema.  Hepatobiliary: Hepatic hypodensity suggests steatosis. No focal hepatic abnormality or intrahepatic ductal dilatation. Gallbladder distention with probable dependent stone image 34.  Pancreas: Normal  Spleen: Normal  Adrenals/Urinary Tract: Adrenal glands appear normal. Moderate nonspecific bilateral perinephric stranding is identified. No hydroureteronephrosis. A Foley catheter is in place and the bladder is decompressed. No radiopaque renal or ureteral calculus. Renal parenchymal detail is obscured by lack of contrast.  Stomach/Bowel: Stomach appears normal. Normal appendix. No bowel wall thickening or focal segmental dilatation is identified.  Vascular/Lymphatic: No lymphadenopathy. Mild atheromatous aortic calcification identified.  Reproductive: Uterus and ovaries are unremarkable.  Other: Mild soft tissue anasarca is present which may suggest fluid overload. No free air or free intra-abdominal fluid.  Musculoskeletal: No acute osseous abnormality. L5-S1 disc degenerative change.  IMPRESSION: Findings at the lung bases suggest interstitial pulmonary  edema with mild soft tissue anasarca.  Nonspecific bilateral perinephric stranding without hydroureteronephrosis or radiopaque renal or ureteral calculus.  Gallbladder distention with possible internal gallstone identified. This again raises the question of possible cholecystitis in the setting of sepsis.   Electronically Signed   By: Conchita Paris M.D.   On: 01/24/2015 21:04   Dg Chest 2 View  01/22/2015   CLINICAL DATA:  Atrial fibrillation. Shortness of breath with exertion. Right axillary pain. No injury.  EXAM: CHEST  2 VIEW  COMPARISON:  09/22/2014  FINDINGS: Normal heart size and pulmonary vascularity. Perihilar interstitial changes suggesting chronic bronchitis. Linear atelectasis or scarring in the lung bases. No focal consolidation. No blunting  of costophrenic angles. No pneumothorax. Mediastinal contours appear intact. Calcified aorta. Mild degenerative changes in the spine.  IMPRESSION: Chronic bronchitic changes in the lungs with slight fibrosis or linear atelectasis in the lung bases. No focal consolidation.   Electronically Signed   By: Lucienne Capers M.D.   On: 01/22/2015 23:46   Ct Chest Wo Contrast  01/23/2015   CLINICAL DATA:  69 year old female with right-sided chest and axillary pain. Sepsis. Fever for 1-2 weeks.  EXAM: CT CHEST WITHOUT CONTRAST  TECHNIQUE: Multidetector CT imaging of the chest was performed following the standard protocol without IV contrast.  COMPARISON:  01/22/2015 chest radiograph. Chest CT report dated 09/22/2014. Images are not available at this time due to PACS downtime.  FINDINGS: Mediastinum/Nodes: The heart and great vessels are unremarkable. There is no evidence of thoracic aortic aneurysm. No pleural or pericardial effusions are identified. No enlarged lymph nodes are noted.  Lungs/Pleura: Mild dependent and basilar atelectasis noted. There is no evidence of airspace disease, consolidation, suspicious nodule, mass or endobronchial/endotracheal lesion.  Upper  abdomen: Hepatic steatosis identified.  Musculoskeletal: Right axillary inflammation is identified extending inferiorly along the right chest. There is no evidence of gas or abscess. No acute or suspicious bony abnormalities are identified.  IMPRESSION: Inflammation in the right axilla extending inferiorly along the right chest -suspicious for infection given clinical history. No evidence of focal abscess or gas.  Mild dependent/basilar atelectasis.   Electronically Signed   By: Margarette Canada M.D.   On: 01/23/2015 11:12   Mr Abdomen Wo Contrast  01/29/2015   CLINICAL DATA:  Septic shock. Evaluate subcutaneous soft tissue swelling/ edema involving the abdominal wall.  EXAM: MRI ABDOMEN WITHOUT CONTRAST  TECHNIQUE: Multiplanar multisequence MR imaging was performed without the administration of intravenous contrast.  COMPARISON:  CT scan 01/24/2015  FINDINGS: There is diffuse edema like signal abnormality in the subcutaneous fat involving the entire abdominal wall more significantly on the right side. I do not see a discrete drainable fluid collection to suggest an abscess. No definite involvement of the abdominal muscles. There is however involvement of the latissimus dorsi muscle on the right side.  Intra-abdominal findings include bowel dilatation and cholelithiasis. No large intra-abdominal fluid collections are identified.  IMPRESSION: Diffuse subcutaneous soft tissue swelling/ edema/fluid likely reflecting cellulitis. There is also myositis involving the right latissimus dorsi muscle. No focal drainable fluid collection/abscess is identified.  Bowel dilatation is noted. No significant intra abdominal fluid collection is identified.  Gallbladder distention and cholelithiasis.   Electronically Signed   By: Marijo Sanes M.D.   On: 01/29/2015 12:56   Nm Hepatobiliary Including Gb  01/26/2015   CLINICAL DATA:  Gallstones.  Distended gallbladder on prior CT.  EXAM: NUCLEAR MEDICINE HEPATOBILIARY IMAGING   TECHNIQUE: Sequential images of the abdomen were obtained out to 60 minutes following intravenous administration of radiopharmaceutical.  RADIOPHARMACEUTICALS:  5.0 Millicurie NT-61W Choletec  COMPARISON:  CT 01/24/2015  FINDINGS: Prompt uptake and excretion of radiotracer by the liver. Activity is seen in small bowel by 30 minutes. No activity noted in the gallbladder at 90 minutes. Therefore, 4 mg of morphine was administered intravenously. Continued imaging demonstrates filling of the gallbladder post morphine.  IMPRESSION: Delayed filling of the gallbladder, but the gallbladder fills after morphine administration compatible with patent cystic duct. No evidence of biliary obstruction.   Electronically Signed   By: Rolm Baptise M.D.   On: 01/26/2015 14:28   [place] Chest  01/25/2015   CLINICAL DATA:  69 year old  female with right axillary and chest wall pain. Fever and sepsis. Initial encounter.  EXAM: CHEST ULTRASOUND  COMPARISON:  Chest CT 01/23/2015.  FINDINGS: Ultrasound imaging in the area of clinical concern corresponding to the right axilla and chest wall.  Lymph nodes with normal fatty hila. Diffuse subcutaneous edema. Evidence of edematous chest wall muscle (image 19) with separation of muscle fibers, but no discrete or drainable fluid collection.  IMPRESSION: Subcutaneous and chest wall muscle edema with no discrete or drainable fluid collection. Regional lymph nodes remain normal.   Electronically Signed   By: Genevie Ann M.D.   On: 01/25/2015 12:11   US Renal  01/24/2015   CLINICAL DATA:  Acute renal insufficiency.  EXAM: RENAL/URINARY TRACT ULTRASOUND COMPLETE  COMPARISON:  11/16/2009  FINDINGS: Right Kidney:  Length: 10.1 cm. Echogenicity within normal limits. No mass or hydronephrosis visualized.  Left Kidney:  Length: 10.4 cm. No hydronephrosis. Mild perinephric edema or trace fluid adjacent the lower pole left kidney.  Bladder:  Collapsed around a Foley catheter.  Exam degraded by patient body  habitus. Incidental note is made of a 2.6 cm gallstone and a left-sided pleural effusion.  IMPRESSION: 1.  No hydronephrosis.  No explanation for renal insufficiency. 2. Decreased sensitivity and specificity exam due to technique related factors, as described above. 3. Cholelithiasis. 4. Small left pleural effusion.   Electronically Signed   By: Abigail Miyamoto M.D.   On: 01/24/2015 10:56   Dg Chest Port 1 View  01/28/2015   CLINICAL DATA:  Central line placement.  EXAM: PORTABLE CHEST - 1 VIEW  COMPARISON:  01/28/2015.  FINDINGS: The right IJ central venous catheter tip is in the proximal SVC above the level of the carina. No complicating features are demonstrated. Persistent bibasilar atelectasis. Possible small right effusion.  IMPRESSION: Right IJ central venous catheter tip is in the proximal SVC. No complicating features.  Persistent bibasilar atelectasis and possible small right effusion.   Electronically Signed   By: Marijo Sanes M.D.   On: 01/28/2015 15:25   Dg Chest Port 1 View  01/28/2015   CLINICAL DATA:  Acute respiratory failure.  EXAM: PORTABLE CHEST - 1 VIEW  COMPARISON:  January 27, 2015.  FINDINGS: Stable cardiomediastinal silhouette. No pneumothorax is noted. Left lung is clear. New right basilar opacity is noted concerning for pneumonia or atelectasis, with possible associated pleural effusion. Left-sided PICC line is noted with distal tip overlying expected position of the SVC.  IMPRESSION: New right basilar opacity is noted concerning for pneumonia or atelectasis with associated pleural effusion. Followup radiographs are recommended.   Electronically Signed   By: Marijo Conception, M.D.   On: 01/28/2015 08:26   Dg Chest Port 1 View  01/27/2015   CLINICAL DATA:  69 year old female with sepsis, septic shock. Initial encounter.  EXAM: PORTABLE CHEST - 1 VIEW  COMPARISON:  01/24/2015 and earlier.  FINDINGS: Portable AP upright view at 0802 hours. Stable left PICC line. Mildly improved lung  volumes and bibasilar ventilation. Bilateral increased interstitial markings have regressed and now appear at baseline compared to [place] stands. No pneumothorax. No pleural effusion or consolidation. Stable cardiac size and mediastinal contours.  IMPRESSION: Regressed bilateral interstitial opacity, lung parenchyma now all appears at baseline compared to 2015 studies. No new cardiopulmonary abnormality.   Electronically Signed   By: Genevie Ann M.D.   On: 01/27/2015 08:08   Dg Chest Port 1 View  01/24/2015   CLINICAL DATA:  Wheezing and shortness of  breath.  EXAM: PORTABLE CHEST - 1 VIEW  COMPARISON:  01/24/2015  FINDINGS: Left-sided PICC line tip overlies the superior vena cava. Heart is mildly prominent. There Kerley B-lines consistent with pulmonary edema. No focal consolidation. No overt alveolar edema.  IMPRESSION: 1. Mild interstitial edema. 2. Left-sided PICC line tip to the superior vena cava.   Electronically Signed   By: Nolon Nations M.D.   On: 01/24/2015 22:14   Dg Chest Port 1 View  01/24/2015   CLINICAL DATA:  Hypoxia, pt was admitted with an acute URI and right armpit infection and was found to be in a-fib with RVR, hypovolemic, hyponatremic with an acute renal failure.  EXAM: PORTABLE CHEST - 1 VIEW  COMPARISON:  Radiograph 01/23/2015  FINDINGS: Left-sided PICC line with tip in distal SVC. Stable cardiac silhouette. There is mild central venous congestion. No focal infiltrate. No pulmonary edema. Mild basilar atelectasis.  IMPRESSION: Mild basilar atelectasis and minimal central venous congestion. No change from prior.   Electronically Signed   By: Suzy Bouchard M.D.   On: 01/24/2015 09:44   Dg Chest Port 1 View  01/23/2015   CLINICAL DATA:  Shortness of breath.  EXAM: PORTABLE CHEST - 1 VIEW  COMPARISON:  Same day.  FINDINGS: The heart size and mediastinal contours are within normal limits. Both lungs are clear. No pneumothorax or pleural effusion is noted. Left-sided PICC line is  noted with tip in expected position of cavoatrial junction. The visualized skeletal structures are unremarkable.  IMPRESSION: No acute cardiopulmonary abnormality seen.   Electronically Signed   By: Marijo Conception, M.D.   On: 01/23/2015 18:52   Dg Chest Port 1 View  01/23/2015   CLINICAL DATA:  PICC line placement  EXAM: PORTABLE CHEST - 1 VIEW  COMPARISON:  CT scan of the chest same day  FINDINGS: Cardiomediastinal silhouette is unremarkable. No acute infiltrate or pulmonary edema. There is a left arm PICC line with tip in right atrium. For distal SVC position the PICC line should be retracted about 1 cm. No pneumothorax.  IMPRESSION: Left arm PICC line with tip in right atrium. For distal SVC position PICC line should be retracted about 1 cm. No pneumothorax.   Electronically Signed   By: Lahoma Crocker M.D.   On: 01/23/2015 17:45   US Abdomen Limited Ruq  01/25/2015   CLINICAL DATA:  Upper abdominal pain  EXAM: US ABDOMEN LIMITED - RIGHT UPPER QUADRANT  COMPARISON:  None.  FINDINGS: Gallbladder:  There is a a focal gallstone measuring 1.9 cm in length which moves and shadows. Gallbladder wall is not thickened, and there is no pericholecystic fluid. No sonographic Murphy sign noted.  Common bile duct:  Diameter: 5 mm. No intrahepatic or extrahepatic biliary duct dilatation.  Liver:  No focal lesion identified. Liver echogenicity is diffusely increased.  There is a small right pleural effusion.  IMPRESSION: Cholelithiasis. Increased liver echogenicity, most likely due to hepatic steatosis. While no focal liver lesions are identified, it must be cautioned that the sensitivity of ultrasound for focal liver lesions is diminished in this circumstance. Small right pleural effusion noted.   Electronically Signed   By: Lowella Grip III M.D.   On: 01/25/2015 12:15      Microbiology: Recent Results (from the past 240 hour(s))  Clostridium Difficile by PCR     Status: None   Collection Time: 02/01/15  4:59 PM   Result Value Ref Range Status   C difficile by pcr NEGATIVE NEGATIVE  Final     Labs: Results for orders placed or performed during the hospital encounter of 01/22/15 (from the past 48 hour(s))  Protime-INR     Status: Abnormal   Collection Time: 02/08/15  5:00 AM  Result Value Ref Range   Prothrombin Time 15.7 (H) 11.6 - 15.2 seconds   INR 1.24 0.00 - 3.70  Basic metabolic panel     Status: Abnormal   Collection Time: 02/08/15  5:00 AM  Result Value Ref Range   Sodium 139 135 - 145 mmol/L   Potassium 4.0 3.5 - 5.1 mmol/L   Chloride 105 96 - 112 mmol/L   CO2 23 19 - 32 mmol/L   Glucose, Bld 108 (H) 70 - 99 mg/dL   BUN 35 (H) 6 - 23 mg/dL   Creatinine, Ser 3.04 (H) 0.50 - 1.10 mg/dL   Calcium 8.8 8.4 - 10.5 mg/dL   GFR calc non Af Amer 15 (L) >90 mL/min   GFR calc Af Amer 17 (L) >90 mL/min    Comment: (NOTE) The eGFR has been calculated using the CKD EPI equation. This calculation has not been validated in all clinical situations. eGFR's persistently <90 mL/min signify possible Chronic Kidney Disease.    Anion gap 11 5 - 15  CBC     Status: Abnormal   Collection Time: 02/08/15 11:35 AM  Result Value Ref Range   WBC 5.6 4.0 - 10.5 K/uL   RBC 2.56 (L) 3.87 - 5.11 MIL/uL   Hemoglobin 8.4 (L) 12.0 - 15.0 g/dL   HCT 25.3 (L) 36.0 - 46.0 %   MCV 98.8 78.0 - 100.0 fL   MCH 32.8 26.0 - 34.0 pg   MCHC 33.2 30.0 - 36.0 g/dL   RDW 14.2 11.5 - 15.5 %   Platelets 261 150 - 400 K/uL  Protime-INR     Status: Abnormal   Collection Time: 02/09/15  4:25 AM  Result Value Ref Range   Prothrombin Time 16.3 (H) 11.6 - 15.2 seconds   INR 1.29 0.00 - 4.88  Basic metabolic panel     Status: Abnormal   Collection Time: 02/09/15  4:25 AM  Result Value Ref Range   Sodium 135 135 - 145 mmol/L   Potassium 3.6 3.5 - 5.1 mmol/L   Chloride 104 96 - 112 mmol/L   CO2 22 19 - 32 mmol/L   Glucose, Bld 104 (H) 70 - 99 mg/dL   BUN 31 (H) 6 - 23 mg/dL   Creatinine, Ser 2.81 (H) 0.50 - 1.10 mg/dL    Calcium 8.6 8.4 - 10.5 mg/dL   GFR calc non Af Amer 16 (L) >90 mL/min   GFR calc Af Amer 19 (L) >90 mL/min    Comment: (NOTE) The eGFR has been calculated using the CKD EPI equation. This calculation has not been validated in all clinical situations. eGFR's persistently <90 mL/min signify possible Chronic Kidney Disease.    Anion gap 9 5 - 15  CBC     Status: Abnormal   Collection Time: 02/09/15  4:25 AM  Result Value Ref Range   WBC 5.7 4.0 - 10.5 K/uL   RBC 2.44 (L) 3.87 - 5.11 MIL/uL   Hemoglobin 8.1 (L) 12.0 - 15.0 g/dL   HCT 24.4 (L) 36.0 - 46.0 %   MCV 100.0 78.0 - 100.0 fL   MCH 33.2 26.0 - 34.0 pg   MCHC 33.2 30.0 - 36.0 g/dL   RDW 14.4 11.5 - 15.5 %   Platelets 237 150 -  400 K/uL  Glucose, capillary     Status: Abnormal   Collection Time: 02/09/15  6:18 AM  Result Value Ref Range   Glucose-Capillary 121 (H) 70 - 99 mg/dL   Comment 1 Notify RN    Comment 2 Document in Chart      Patient is a 69 year old female with paroxysmal atrial fibrillation, hypertension, obesity presented with 2 day history of nausea, vomiting, generalized malaise, diarrhea, palpitations and fever. She had vomited several times and was unable to hold down her home medications. Lactic acid was 3.6 at the time of admission. CT chest showed inflammation in the right axilla extending inferiorly along the right chest but no focal evidence of abscess or gas. Patient was admitted to stepdown unit for further workup. BMET at the time of admission showed sodium of 127, creatinine 1.97, bicarbonate 16.   Assessment & Plan   Principal problem Septic shock due to Group A strep cellulitis, erysipelas / chest wall induration with Streptococcus bacteremia  - Improved The patient was admitted with acute viral gastroenteritis and lactic acidosis. CT chest showed inflammation in the right axilla extending inferiorly along the right chest, no focal evidence of abscess or gas. CT abdomen and pelvis showed no  renal obstruction, gallbladder distention with possible gallstones. Renal ultrasound showed no hydronephrosis, cholelithiasis however small left pleural effusion. Patient however became hypotensive, tachycardiac with respiratory distress and PCCM was consulted on 4/11. Patient required critical care support for vasopressors and was off the pressors on 4/13. HIDA scan showed a delayed filling of gallbladder, no evidence of biliary obstruction. MRI of the flank showed no focal pockets of infection/abscess. ID, Dr. Linus Salmons signed off on 4/18, recommended to continue IV ceftriaxone through 4/24, patient has completed. - Patient had initially complained of some shoulder pain which has now resolved and per ID, no indication for MRI.   Anemia- normocytic, likely due to chronic disease:  - Stool occult test +, aspirin and heparin subcutaneous was discontinued. Patient was started on Coumadin on 4/22 however hemoglobin has been trending down.  - GI consulted, EGD- Normal appearing esophagus and GE junction, the stomach was well visualized and normal in appearance, normal appearing duodenum Hemoglobin stable last several days GI recommends stay off NSAIDs, possible colonoscopy capsule study outpatient   Persistent Atrial Fibrillation :  - Cardiology following, rate controlled, off of IV Cardizem drip  - CHADS2VASC 3 - cardiology recommending anticoagulation, not a candidate for NOAC due to renal failure. As above, Coumadin was started on 4/22 , INR is still subtherapeutic on Zebeta 10 mg and Diltiazem 180 mg BID now (increased 4/25) for the most part well rate controlled (<110).  Atrial fibrillation better controlled Monitor INR on a daily basis and adjust Coumadin dosing based on INR levels   ?Viral Gastroenteritis - Now resolved  Acute Kidney Injury - improving - Likely due to sepsis/septic shock, atrial fibrillation, hypotension, NSAIDs. - Nephrology following closely, HD catheter removed, Foley  catheter removed - Outpatient follow-up with nephrology  Acute pulm edema - resolved  HSV1 exacerbation  Appears to be resolving - acyclovir initiated 4/19   Hyponatremia  -Resolved  Thrombocytopenia  Likely due to consumptive colopathy secondary to sepsis, resolved   Abnormal Myoview  Obtained in March 2016 -reviewed cardiology recommendations, currently no chest pain, cardiac enzymes negative. Defer further evaluation due to renal and additional significant comorbidities.  Obesity - Body mass index is 36.77 kg/(m^2).  - patient counseled on diet and weight control   High D-dimer  -  doubt PE, CT angiogram not possible due to renal function, Doppler lower extremity negative for DVT  - holding coumadin  Suspected OSA  Saturations remaining stable - will need outpatient sleep study   Code Status:Full code          Discharge Exam:    Blood pressure 115/67, pulse 65, temperature 97.6 F (36.4 C), temperature source Oral, resp. rate 18, height _0  (1.676 m), weight 103.9 kg (229 lb 0.9 oz), SpO2 100 %. Gen: laying in bed no distress Respiratory: CTA bilaterally. Breathing unlabored.  Heart: irregularly irregular rhythm Abd: overweight Neuro: intact, AAO x 3        Discharge Instructions    Diet - low sodium heart healthy    Complete by:  As directed      Increase activity slowly    Complete by:  As directed            Follow-up Information    Follow up with [name], MD. Schedule an appointment as soon as possible for a visit in 3 days.   Specialty:  Internal Medicine   Contact information:   [place] [address] [phone]       Follow up with [name], MD. Schedule an appointment as soon as possible for a visit in 1 week.   Specialty:  Cardiology   Why:  Atrial fibrillation   Contact information:   [place] [address] [phone]       Signed: Reyne Dumas 02/09/2015,  10:56 AM

## 2015-02-09 NOTE — Progress Notes (Addendum)
ANTICOAGULATION CONSULT NOTE - Follow Up Consult  Pharmacy Consult for Coumadin Indication: atrial fibrillation  Allergies  Allergen Reactions  . Epinephrine Other (See Comments)    Heart rate soars  . Epinephrine Base Other (See Comments)    Seriously increases heart rate  . Aspirin Other (See Comments)    Can take the coated 325 mg. Plain asa 325 mg speeds up the heart.  . Penicillins Other (See Comments)    Pt previously has been told not to take because of family history of reactions (water blisters). She took amoxicillin in 2012-2013 with no reaction  . Tape Other (See Comments)    Blisters from adhesive tape    Patient Measurements: Height: 5\' 6"  (167.6 cm) Weight: 229 lb 0.9 oz (103.9 kg) IBW/kg (Calculated) : 59.3 Heparin Dosing Weight:   Vital Signs: Temp: 97.6 F (36.4 C) (04/27 0524) Temp Source: Oral (04/27 0524) BP: 115/67 mmHg (04/27 0524) Pulse Rate: 65 (04/27 0524)  Labs:  Recent Labs  02/07/15 0550 02/07/15 1043 02/08/15 0500 02/08/15 1135 02/09/15 0425  HGB 8.3*  --   --  8.4* 8.1*  HCT 24.6*  --   --  25.3* 24.4*  PLT 234  --   --  261 237  LABPROT 16.7*  --  15.7*  --  16.3*  INR 1.34  --  1.24  --  1.29  CREATININE  --  3.23* 3.04*  --  2.81*    Estimated Creatinine Clearance: 23.3 mL/min (by C-G formula based on Cr of 2.81).   Medications:  Scheduled:  . acyclovir ointment  1 application Topical O6Z  . benzonatate  200 mg Oral TID  . bisoprolol  10 mg Oral BID  . chlorpheniramine-HYDROcodone  5 mL Oral Q12H  . diltiazem  180 mg Oral BID  . estrogens (conjugated)  0.3 mg Oral Q48H  . famotidine  20 mg Oral QHS  . sertraline  50 mg Oral Daily  . valACYclovir  1,000 mg Oral Daily  . warfarin  7.5 mg Oral ONCE-1800  . Warfarin - Pharmacist Dosing Inpatient   Does not apply q1800    Assessment: 69yo female with AFib, to be d/c'd home today.  Coumadin resumed s/p EGD & OK per GI on 4/26.  INR 1.29, Hg 8.1, pltc wnl.  No bleeding  noted.  Coumadin dose for 4/27 ordered by MD earlier today.  Goal of Therapy:  INR 2-3 Monitor platelets by anticoagulation protocol: Yes   Plan:  Coumadin 7.5mg  this PM if still here  Gracy Bruins, PharmD [facility]

## 2015-02-09 NOTE — Telephone Encounter (Signed)
Pt currently in hospital. I spoke with Rosaria Ferries, PA and hospital team will contact pt.

## 2015-02-10 ENCOUNTER — Encounter: Payer: Self-pay | Admitting: Internal Medicine

## 2015-02-10 ENCOUNTER — Non-Acute Institutional Stay (SKILLED_NURSING_FACILITY): Payer: Medicare Other | Admitting: Adult Health

## 2015-02-10 ENCOUNTER — Telehealth: Payer: Self-pay | Admitting: *Deleted

## 2015-02-10 ENCOUNTER — Ambulatory Visit: Payer: Medicare Other | Admitting: Internal Medicine

## 2015-02-10 ENCOUNTER — Other Ambulatory Visit: Payer: Self-pay | Admitting: *Deleted

## 2015-02-10 ENCOUNTER — Encounter: Payer: Self-pay | Admitting: Adult Health

## 2015-02-10 ENCOUNTER — Telehealth: Payer: Self-pay | Admitting: Internal Medicine

## 2015-02-10 ENCOUNTER — Ambulatory Visit (INDEPENDENT_AMBULATORY_CARE_PROVIDER_SITE_OTHER): Payer: Medicare Other | Admitting: Internal Medicine

## 2015-02-10 VITALS — BP 118/80 | HR 96 | Temp 98.2°F | Resp 18 | Ht 66.0 in | Wt 229.0 lb

## 2015-02-10 DIAGNOSIS — D631 Anemia in chronic kidney disease: Secondary | ICD-10-CM

## 2015-02-10 DIAGNOSIS — I4891 Unspecified atrial fibrillation: Secondary | ICD-10-CM | POA: Diagnosis not present

## 2015-02-10 DIAGNOSIS — R2689 Other abnormalities of gait and mobility: Secondary | ICD-10-CM

## 2015-02-10 DIAGNOSIS — R5381 Other malaise: Secondary | ICD-10-CM

## 2015-02-10 DIAGNOSIS — B009 Herpesviral infection, unspecified: Secondary | ICD-10-CM | POA: Diagnosis not present

## 2015-02-10 DIAGNOSIS — Z79890 Hormone replacement therapy (postmenopausal): Secondary | ICD-10-CM

## 2015-02-10 DIAGNOSIS — R531 Weakness: Secondary | ICD-10-CM

## 2015-02-10 DIAGNOSIS — N179 Acute kidney failure, unspecified: Secondary | ICD-10-CM

## 2015-02-10 DIAGNOSIS — N189 Chronic kidney disease, unspecified: Secondary | ICD-10-CM

## 2015-02-10 DIAGNOSIS — R6521 Severe sepsis with septic shock: Secondary | ICD-10-CM | POA: Diagnosis not present

## 2015-02-10 DIAGNOSIS — I482 Chronic atrial fibrillation, unspecified: Secondary | ICD-10-CM

## 2015-02-10 DIAGNOSIS — R0600 Dyspnea, unspecified: Secondary | ICD-10-CM

## 2015-02-10 DIAGNOSIS — A409 Streptococcal sepsis, unspecified: Secondary | ICD-10-CM

## 2015-02-10 DIAGNOSIS — G43709 Chronic migraine without aura, not intractable, without status migrainosus: Secondary | ICD-10-CM

## 2015-02-10 MED ORDER — TRAMADOL HCL 50 MG PO TABS: 50.0000 mg | ORAL_TABLET | Freq: Two times a day (BID) | ORAL | Status: DC | PRN

## 2015-02-10 MED ORDER — HYDROCOD POLST-CPM POLST ER 10-8 MG/5ML PO SUER: 5.0000 mL | Freq: Two times a day (BID) | ORAL | Status: DC | PRN

## 2015-02-10 NOTE — Telephone Encounter (Signed)
Patient just released to [facility]

## 2015-02-10 NOTE — Progress Notes (Signed)
Pre visit review using our clinic review tool, if applicable. No additional management support is needed unless otherwise documented below in the visit note. 

## 2015-02-10 NOTE — Telephone Encounter (Signed)
Patient was in today to see Dr. Jenny Reichmann. She is patient of Dr. Alain Marion. She wants to leave rehab. They are willing to pay for any time that insurance won't pay for her to be home. Please call patient's husband

## 2015-02-10 NOTE — Progress Notes (Signed)
Subjective:    Patient ID: Amanda Wilkins, female    DOB: 02-15-1946, 69 y.o.   MRN: [id number]  HPI   Here with sob, to f/u post hosp d/c yesterday, now at rehab and is begging for "release" as she just does not like the people she is with", c/o today with 3 wks ongoing cough and sob but no change. Appears generally weak after recent prolonged hospn with afib/rvr, septic shock, strep infection, AKI, hyponatremia.  Pt denies chest pain, increased sob or doe, wheezing, orthopnea, PND, increased LE swelling, palpitations, dizziness or syncope.  Pt denies new neurological symptoms such as new headache, or facial or extremity weakness or numbness.   Pt denies polydipsia, polyuria Past Medical History  Diagnosis Date  . Allergy   . Atrial fibrillation   . Migraines     on Zoloft for migraines  . Hypertension   . Chronic interstitial nephritis   . Depression   . Palpitations   . Obesity   . Arthritis     ON BOTH KNEES   Past Surgical History  Procedure Laterality Date  . Cesarean section    . Dental surgery      multiple implants  . Repair of torn meniscus on left 08/2014 Left 08/2015    Dr. Ronnie Derby at [facility]  . Esophagogastroduodenoscopy N/A 02/08/2015    Procedure: ESOPHAGOGASTRODUODENOSCOPY (EGD);  Surgeon: [name], MD;  Location: [facility];  Service: Endoscopy;  Laterality: N/A;    reports that she has never smoked. She has never used smokeless tobacco. She reports that she drinks about 8.4 oz of alcohol per week. She reports that she does not use illicit drugs. family history includes Cancer in her brother. Allergies  Allergen Reactions  . Epinephrine Other (See Comments)    Heart rate soars  . Epinephrine Base Other (See Comments)    Seriously increases heart rate  . Aspirin Other (See Comments)    Can take the coated 325 mg. Plain asa 325 mg speeds up the heart.  . Penicillins Other (See Comments)    Pt previously has been told not to take because of family  history of reactions (water blisters). She took amoxicillin in 2012-2013 with no reaction  . Tape Other (See Comments)    Blisters from adhesive tape   Current Outpatient Prescriptions on File Prior to Visit  Medication Sig Dispense Refill  . acyclovir ointment (ZOVIRAX) 5 % Apply 1 application topically every 3 (three) hours. Apply to lips for 10days or until healed 30 g 3  . Alum & Mag Hydroxide-Simeth (MAGIC MOUTHWASH W/LIDOCAINE) SOLN Take 5 mLs by mouth 3 (three) times daily as needed for mouth pain. 150 mL 3  . benzonatate (TESSALON) 200 MG capsule Take 1 capsule (200 mg total) by mouth 3 (three) times daily. 30 capsule 0  . bisoprolol (ZEBETA) 10 MG tablet Take 1 tablet (10 mg total) by mouth 2 (two) times daily. 60 tablet 3  . Cholecalciferol (VITAMIN D) 2000 UNITS tablet Take 2,000 Units by mouth daily.    [name] diltiazem (CARDIZEM CD) 180 MG 24 hr capsule Take 1 capsule (180 mg total) by mouth 2 (two) times daily. 60 capsule 3  . estrogens, conjugated, (PREMARIN) 0.3 MG tablet Take daily (Patient taking differently: Take 0.15 mg by mouth at bedtime. Tapering down:  Take 1/2 tablet (0.15 mg) daily until the end of May, then stop) 30 tablet 11  . glycopyrrolate (ROBINUL) 1 MG tablet Take 1-2 mg by mouth 2 (two)  times daily. Take 2 tablets (2 mg) every morning and 1 tablet (1 mg) every night    . medroxyPROGESTERone (PROVERA) 10 MG tablet TAKE ONE TABLET 10 DAYS OF THE MONTH (Patient taking differently: Take 10 mg by mouth See admin instructions. Take 1 tablet (10 mg) on the 1st thru 10th of each month. Will stop after the 10th of May 2016) 30 tablet 4  . mupirocin ointment (BACTROBAN) 2 % Applied twice a day to the affected area;NOT into eyes. (Patient taking differently: Apply 1 application topically 2 (two) times daily as needed (ear drainage/swelling). Do not use in eyes) 15 g 0  . sertraline (ZOLOFT) 50 MG tablet Take 1 tablet (50 mg total) by mouth daily. 20 tablet 0  . valACYclovir  (VALTREX) 1000 MG tablet Take 1 tablet (1,000 mg total) by mouth daily. X 14days 14 tablet 0  . warfarin (COUMADIN) 5 MG tablet 5 mg by mouth daily, except Thursday Saturday and Sunday 30 tablet 2   No current facility-administered medications on file prior to visit.   Review of Systems  Constitutional: Negative for unusual diaphoresis or night sweats HENT: Negative for ringing in ear or discharge Eyes: Negative for double vision or worsening visual disturbance.  Respiratory: Negative for choking and stridor.   Gastrointestinal: Negative for vomiting or other signifcant bowel change Genitourinary: Negative for hematuria or change in urine volume.  Musculoskeletal: Negative for other MSK pain or swelling Skin: Negative for color change and worsening wound.  Neurological: Negative for tremors and numbness other than noted  Psychiatric/Behavioral: Negative for decreased concentration or agitation other than above       Objective:   Physical Exam BP 118/80 mmHg  Pulse 96  Temp(Src) 98.2 F (36.8 C) (Oral)  Resp 18  Ht 5\' 6"  (1.676 m)  Wt 229 lb 0.6 oz (103.892 kg)  BMI 36.99 kg/m2  SpO2 96% VS noted, appears generally weak Constitutional: Pt appears in no significant distress HENT: Head: NCAT.  Right Ear: External ear normal.  Left Ear: External ear normal.  Eyes: . Pupils are equal, round, and reactive to light. Conjunctivae and EOM are normal Neck: Normal range of motion. Neck supple.  Cardiovascular: Normal rate and regular rhythm.   Pulmonary/Chest: Effort normal and breath sounds decreased without rales or wheezing.  Abd:  Soft, NT, ND, + BS Neurological: Pt is alert. Not confused , motor grossly intact Skin: Skin is warm. No rash, no LE edema Psychiatric: Pt behavior is normal. No agitation.     Assessment & Plan:

## 2015-02-10 NOTE — Patient Instructions (Signed)
Please continue all other medications as before, and refills have been done if requested.  Please have the pharmacy call with any other refills you may need.  Please keep your appointments with your specialists as you may have planned    

## 2015-02-10 NOTE — Care Management Note (Signed)
    Page 1 of 1   02/10/2015     8:52:22 AM CARE MANAGEMENT NOTE 02/10/2015  Patient:  TASHIANA, LAMARCA   Account Number:  [id number]  Date Initiated:  01/26/2015  Documentation initiated by:  Luz Lex  Subjective/Objective Assessment:   Admitted with 2days n/v/d and malaise.  Admitted with sepsis - deteriorated with hyptension and SOB from fluid. Tx to ICU on 4-11 - on pressors.     Action/Plan:   Anticipated DC Date:  02/07/2015   Anticipated DC Plan:  SKILLED NURSING FACILITY  In-house referral  Clinical Social Worker      DC Planning Services  CM consult      Choice offered to / List presented to:             Status of service:  Completed, signed off Medicare Important Message given?  YES (If response is "NO", the following Medicare IM given date fields will be blank) Date Medicare IM given:  02/01/2015 Medicare IM given by:  MAYO,HENRIETTA Date Additional Medicare IM given:  02/08/2015 Additional Medicare IM given by:  Marvetta Gibbons  Discharge Disposition:  [place]  Per UR Regulation:  Reviewed for med. necessity/level of care/duration of stay  If discussed at [place] of Stay Meetings, dates discussed:   02/01/2015  02/08/2015    Comments:  Contact:  Gustafson,Riber Spouse [phone]  [phone]  02/09/15- 1200- Marvetta Gibbons RN BSN [phone] Pt has chosen [facility] for [facility] [facility].   02/01/15 [place] MSN BSN CCM Transferred to 2C, cardizem changed to PO, Rocephin q 24. PT/OT recommend SNF for rehab, CSW aware.

## 2015-02-10 NOTE — Telephone Encounter (Signed)
Spoke to [employer]   Patient going to rehab   Will f/u as outpatient

## 2015-02-10 NOTE — Telephone Encounter (Signed)
Transition Care Management Follow-up Telephone Call d/c 02/09/15  How have you been since you were released from the hospital? Pt states not feeling any better have made appt to see Dr. Jenny Reichmann this am for SOB sxs   Do you understand why you were in the hospital? YES   Do you understand the discharge instrcutions? YES  Items Reviewed:  Medications reviewed: YES  Allergies reviewed: YES  Dietary changes reviewed: NO  Referrals reviewed: NO   Functional Questionnaire:   Activities of Daily Living (ADLs):   She states they are independent in the following: ambulation, bathing and hygiene, feeding, continence, grooming, toileting and dressing States they require assistance with the following: ambulation   Any transportation issues/concerns?: NO   Any patient concerns? YES, having SOB    Confirmed importance and date/time of follow-up visits scheduled: YES, pt had call to make appt this am to see Dr. Jenny Reichmann @ 11:00 since having SOB sxs   Confirmed with patient if condition begins to worsen call PCP or go to the ER.

## 2015-02-11 NOTE — Telephone Encounter (Signed)
Ok  I'll ref to OP PT (done) Thx

## 2015-02-11 NOTE — Telephone Encounter (Signed)
Notified patient.

## 2015-02-11 NOTE — Telephone Encounter (Signed)
Patient has called back in.  She states she is at [facility].  She has been there for 2 days and is requesting a referral for out patient services so she can leave [facility].  Please advise.

## 2015-02-12 ENCOUNTER — Other Ambulatory Visit: Payer: Self-pay | Admitting: Internal Medicine

## 2015-02-12 DIAGNOSIS — R531 Weakness: Secondary | ICD-10-CM | POA: Insufficient documentation

## 2015-02-12 NOTE — Assessment & Plan Note (Signed)
D/w pt, I did not feel comfortable with stating the was well enough to leave inpt rehab, and would not sign a paper to this effect.  If she leaves, this would be per her guidance alone.  She is not aggreable to this, plans to call her PCP.  I encouraged her to stay for the more intensive longer PT available at rehab, as this be of more benefit than outpt PT

## 2015-02-12 NOTE — Assessment & Plan Note (Signed)
stable overall by history and exam, recent data reviewed with pt, and pt to continue medical treatment as before,  to f/u any worsening symptoms or concerns  

## 2015-02-12 NOTE — Assessment & Plan Note (Signed)
Exam and stable overall stable, to cont current tx,  to f/u any worsening symptoms or concerns\ SpO2 Readings from Last 3 Encounters:  02/10/15 98%  02/10/15 96%  02/09/15 100%

## 2015-02-15 ENCOUNTER — Ambulatory Visit (INDEPENDENT_AMBULATORY_CARE_PROVIDER_SITE_OTHER): Payer: Medicare Other | Admitting: Internal Medicine

## 2015-02-15 ENCOUNTER — Ambulatory Visit (INDEPENDENT_AMBULATORY_CARE_PROVIDER_SITE_OTHER): Payer: Medicare Other | Admitting: General Practice

## 2015-02-15 ENCOUNTER — Ambulatory Visit: Payer: Medicare Other

## 2015-02-15 ENCOUNTER — Encounter: Payer: Self-pay | Admitting: Internal Medicine

## 2015-02-15 VITALS — BP 150/80 | HR 81 | Wt 221.0 lb

## 2015-02-15 DIAGNOSIS — I10 Essential (primary) hypertension: Secondary | ICD-10-CM | POA: Diagnosis not present

## 2015-02-15 DIAGNOSIS — I4891 Unspecified atrial fibrillation: Secondary | ICD-10-CM

## 2015-02-15 DIAGNOSIS — R5381 Other malaise: Secondary | ICD-10-CM | POA: Diagnosis not present

## 2015-02-15 DIAGNOSIS — I480 Paroxysmal atrial fibrillation: Secondary | ICD-10-CM | POA: Diagnosis not present

## 2015-02-15 DIAGNOSIS — Z7901 Long term (current) use of anticoagulants: Secondary | ICD-10-CM

## 2015-02-15 DIAGNOSIS — R61 Generalized hyperhidrosis: Secondary | ICD-10-CM

## 2015-02-15 LAB — POCT INR: INR: 1.9

## 2015-02-15 NOTE — Assessment & Plan Note (Signed)
Robinul - ok to wean off

## 2015-02-15 NOTE — Assessment & Plan Note (Signed)
Start PT 

## 2015-02-15 NOTE — Progress Notes (Signed)
Pre visit review using our clinic review tool, if applicable. No additional management support is needed unless otherwise documented below in the visit note. 

## 2015-02-15 NOTE — Patient Instructions (Signed)

## 2015-02-15 NOTE — Progress Notes (Signed)
Subjective:    HPI Post-hosp f/up:  Admit date: 01/22/2015 Discharge date: 02/09/2015  Discharge Diagnoses:    Principal Problem:  Atrial fibrillation with RVR Active Problems:  Palpitations  Upper respiratory disease  Dehydration  Breast pain, right  A-fib  Fever  Nausea & vomiting  Sepsis  Acute renal insufficiency  Abdominal pain  Axillary pain  Dyspnea  Hypoxia  Thrombocytopenia  Transaminitis  Hyperbilirubinemia  FUO (fever of unknown origin)  Septic shock  AKI (acute kidney injury)  Cholecystitis  DIC (disseminated intravascular coagulation)  Group A streptococcal infection  Cellulitis  D-dimer, elevated  Severe sepsis with septic shock  Acute respiratory failure  HSV-1 (herpes simplex virus 1) infection  Gallstones  Shoulder pain  Septic shock due to streptococcal infection  Atelectasis  Acute pulmonary edema  Persistent atrial fibrillation  Acute kidney injury  Hyponatremia  Hypokalemia  Hyperglycemia  Elevated d-dimer  OSA (obstructive sleep apnea)  Absolute anemia  Follow-up recommendations Follow-up with PCP in 3-5 days Follow-up with cardiology in one week Please check INR every day 1 week and then twice a week Pharmacy to adjust Coumadin dosing based on INR Follow-up CBC, CMP weekly    Medication List    STOP taking these medications       aspirin EC 325 MG tablet     atenolol 50 MG tablet  Commonly known as: TENORMIN     celecoxib 200 MG capsule  Commonly known as: CELEBREX     clarithromycin 500 MG 24 hr tablet  Commonly known as: BIAXIN XL     DILT-XR 180 MG 24 hr capsule  Generic drug: diltiazem     nitrofurantoin (macrocrystal-monohydrate) 100 MG capsule  Commonly known as: MACROBID      TAKE these medications       acyclovir ointment 5 %  Commonly known as: ZOVIRAX  Apply 1 application topically every 3 (three) hours.  Apply to lips for 10days or until healed     benzonatate 200 MG capsule  Commonly known as: TESSALON  Take 1 capsule (200 mg total) by mouth 3 (three) times daily.     bisoprolol 10 MG tablet  Commonly known as: ZEBETA  Take 1 tablet (10 mg total) by mouth 2 (two) times daily.     chlorpheniramine-HYDROcodone 10-8 MG/5ML Suer  Commonly known as: TUSSIONEX  Take 5 mLs by mouth every 12 (twelve) hours as needed for cough.     diltiazem 180 MG 24 hr capsule  Commonly known as: CARDIZEM CD  Take 1 capsule (180 mg total) by mouth 2 (two) times daily.     estrogens (conjugated) 0.3 MG tablet  Commonly known as: PREMARIN  Take daily     glycopyrrolate 1 MG tablet  Commonly known as: ROBINUL  Take 1-2 mg by mouth 2 (two) times daily. Take 2 tablets (2 mg) every morning and 1 tablet (1 mg) every night     loperamide 2 MG capsule  Commonly known as: IMODIUM  Take 1 capsule (2 mg total) by mouth 3 (three) times daily as needed for diarrhea or loose stools.     magic mouthwash w/lidocaine Soln  Take 5 mLs by mouth 3 (three) times daily as needed for mouth pain.     medroxyPROGESTERone 10 MG tablet  Commonly known as: PROVERA  TAKE ONE TABLET 10 DAYS OF THE MONTH     menthol-cetylpyridinium 3 MG lozenge  Commonly known as: CEPACOL  Take 1 lozenge (3 mg total) by mouth as needed  for sore throat.     mupirocin ointment 2 %  Commonly known as: BACTROBAN  Applied twice a day to the affected area;NOT into eyes.     sertraline 50 MG tablet  Commonly known as: ZOLOFT  Take 1 tablet (50 mg total) by mouth daily.     traMADol 50 MG tablet  Commonly known as: ULTRAM  Take 1 tablet (50 mg total) by mouth every 12 (twelve) hours as needed for moderate pain.     valACYclovir 1000 MG tablet  Commonly known as: VALTREX  Take 1 tablet (1,000 mg total) by mouth daily. X 14days     Vitamin D 2000 UNITS  tablet  Take 2,000 Units by mouth daily.     warfarin 5 MG tablet  Commonly known as: COUMADIN  5 mg by mouth daily, except Thursday Saturday and Sunday        Discharge Condition: Stable  Disposition: SNF    Consults:  Consults  Cardiology  ID Nephrology    Significant Diagnostic Studies:  Imaging Results    Ct Abdomen Pelvis Wo Contrast  01/24/2015 CLINICAL DATA: Abdominal pain, nausea, vomiting, diarrhea. Fever and chills. Sepsis. EXAM: CT ABDOMEN AND PELVIS WITHOUT CONTRAST TECHNIQUE: Multidetector CT imaging of the abdomen and pelvis was performed following the standard protocol without IV contrast. COMPARISON: No similar prior exam is available at this institution for comparison or on [facility]. FINDINGS: Lower chest: Small bilateral pleural effusions are noted. Interlobular septal thickening and bronchial wall thickening are suggestive of interstitial pulmonary edema. Hepatobiliary: Hepatic hypodensity suggests steatosis. No focal hepatic abnormality or intrahepatic ductal dilatation. Gallbladder distention with probable dependent stone image 34. Pancreas: Normal Spleen: Normal Adrenals/Urinary Tract: Adrenal glands appear normal. Moderate nonspecific bilateral perinephric stranding is identified. No hydroureteronephrosis. A Foley catheter is in place and the bladder is decompressed. No radiopaque renal or ureteral calculus. Renal parenchymal detail is obscured by lack of contrast. Stomach/Bowel: Stomach appears normal. Normal appendix. No bowel wall thickening or focal segmental dilatation is identified. Vascular/Lymphatic: No lymphadenopathy. Mild atheromatous aortic calcification identified. Reproductive: Uterus and ovaries are unremarkable. Other: Mild soft tissue anasarca is present which may suggest fluid overload. No free air or free intra-abdominal fluid. Musculoskeletal: No acute osseous abnormality. L5-S1 disc degenerative change.  IMPRESSION: Findings at the lung bases suggest interstitial pulmonary edema with mild soft tissue anasarca. Nonspecific bilateral perinephric stranding without hydroureteronephrosis or radiopaque renal or ureteral calculus. Gallbladder distention with possible internal gallstone identified. This again raises the question of possible cholecystitis in the setting of sepsis.  Electronically Signed By: Conchita Paris M.D. On: 01/24/2015 21:04   Dg Chest 2 View  01/22/2015 CLINICAL DATA: Atrial fibrillation. Shortness of breath with exertion. Right axillary pain. No injury. EXAM: CHEST 2 VIEW COMPARISON: 09/22/2014 FINDINGS: Normal heart size and pulmonary vascularity. Perihilar interstitial changes suggesting chronic bronchitis. Linear atelectasis or scarring in the lung bases. No focal consolidation. No blunting of costophrenic angles. No pneumothorax. Mediastinal contours appear intact. Calcified aorta. Mild degenerative changes in the spine. IMPRESSION: Chronic bronchitic changes in the lungs with slight fibrosis or linear atelectasis in the lung bases. No focal consolidation. Electronically Signed By: Lucienne Capers M.D. On: 01/22/2015 23:46   Ct Chest Wo Contrast  01/23/2015 CLINICAL DATA: 69 year old female with right-sided chest and axillary pain. Sepsis. Fever for 1-2 weeks. EXAM: CT CHEST WITHOUT CONTRAST TECHNIQUE: Multidetector CT imaging of the chest was performed following the standard protocol without IV contrast. COMPARISON: 01/22/2015 chest radiograph. Chest CT report dated 09/22/2014. Images  are not available at this time due to PACS downtime. FINDINGS: Mediastinum/Nodes: The heart and great vessels are unremarkable. There is no evidence of thoracic aortic aneurysm. No pleural or pericardial effusions are identified. No enlarged lymph nodes are noted. Lungs/Pleura: Mild dependent and basilar atelectasis noted. There is no evidence of airspace disease, consolidation,  suspicious nodule, mass or endobronchial/endotracheal lesion. Upper abdomen: Hepatic steatosis identified. Musculoskeletal: Right axillary inflammation is identified extending inferiorly along the right chest. There is no evidence of gas or abscess. No acute or suspicious bony abnormalities are identified. IMPRESSION: Inflammation in the right axilla extending inferiorly along the right chest -suspicious for infection given clinical history. No evidence of focal abscess or gas. Mild dependent/basilar atelectasis. Electronically Signed By: Margarette Canada M.D. On: 01/23/2015 11:12   Mr Abdomen Wo Contrast  01/29/2015 CLINICAL DATA: Septic shock. Evaluate subcutaneous soft tissue swelling/ edema involving the abdominal wall. EXAM: MRI ABDOMEN WITHOUT CONTRAST TECHNIQUE: Multiplanar multisequence MR imaging was performed without the administration of intravenous contrast. COMPARISON: CT scan 01/24/2015 FINDINGS: There is diffuse edema like signal abnormality in the subcutaneous fat involving the entire abdominal wall more significantly on the right side. I do not see a discrete drainable fluid collection to suggest an abscess. No definite involvement of the abdominal muscles. There is however involvement of the latissimus dorsi muscle on the right side. Intra-abdominal findings include bowel dilatation and cholelithiasis. No large intra-abdominal fluid collections are identified. IMPRESSION: Diffuse subcutaneous soft tissue swelling/ edema/fluid likely reflecting cellulitis. There is also myositis involving the right latissimus dorsi muscle. No focal drainable fluid collection/abscess is identified. Bowel dilatation is noted. No significant intra abdominal fluid collection is identified. Gallbladder distention and cholelithiasis. Electronically Signed By: Marijo Sanes M.D. On: 01/29/2015 12:56   Nm Hepatobiliary Including Gb  01/26/2015 CLINICAL DATA: Gallstones. Distended gallbladder  on prior CT. EXAM: NUCLEAR MEDICINE HEPATOBILIARY IMAGING TECHNIQUE: Sequential images of the abdomen were obtained out to 60 minutes following intravenous administration of radiopharmaceutical. RADIOPHARMACEUTICALS: 5.0 [place] NH-65B Choletec COMPARISON: CT 01/24/2015 FINDINGS: Prompt uptake and excretion of radiotracer by the liver. Activity is seen in small bowel by 30 minutes. No activity noted in the gallbladder at 90 minutes. Therefore, 4 mg of morphine was administered intravenously. Continued imaging demonstrates filling of the gallbladder post morphine. IMPRESSION: Delayed filling of the gallbladder, but the gallbladder fills after morphine administration compatible with patent cystic duct. No evidence of biliary obstruction. Electronically Signed By: Rolm Baptise M.D. On: 01/26/2015 14:28   [place] Chest  01/25/2015 CLINICAL DATA: 69 year old female with right axillary and chest wall pain. Fever and sepsis. Initial encounter. EXAM: CHEST ULTRASOUND COMPARISON: Chest CT 01/23/2015. FINDINGS: Ultrasound imaging in the area of clinical concern corresponding to the right axilla and chest wall. Lymph nodes with normal fatty hila. Diffuse subcutaneous edema. Evidence of edematous chest wall muscle (image 19) with separation of muscle fibers, but no discrete or drainable fluid collection. IMPRESSION: Subcutaneous and chest wall muscle edema with no discrete or drainable fluid collection. Regional lymph nodes remain normal. Electronically Signed By: Genevie Ann M.D. On: 01/25/2015 12:11   US Renal  01/24/2015 CLINICAL DATA: Acute renal insufficiency. EXAM: RENAL/URINARY TRACT ULTRASOUND COMPLETE COMPARISON: 11/16/2009 FINDINGS: Right Kidney: Length: 10.1 cm. Echogenicity within normal limits. No mass or hydronephrosis visualized. Left Kidney: Length: 10.4 cm. No hydronephrosis. Mild perinephric edema or trace fluid adjacent the lower pole left kidney. Bladder: Collapsed  around a Foley catheter. Exam degraded by patient body habitus. Incidental note is made of a 2.6 cm gallstone and  a left-sided pleural effusion. IMPRESSION: 1. No hydronephrosis. No explanation for renal insufficiency. 2. Decreased sensitivity and specificity exam due to technique related factors, as described above. 3. Cholelithiasis. 4. Small left pleural effusion. Electronically Signed By: Abigail Miyamoto M.D. On: 01/24/2015 10:56   Dg Chest Port 1 View  01/28/2015 CLINICAL DATA: Central line placement. EXAM: PORTABLE CHEST - 1 VIEW COMPARISON: 01/28/2015. FINDINGS: The right IJ central venous catheter tip is in the proximal SVC above the level of the carina. No complicating features are demonstrated. Persistent bibasilar atelectasis. Possible small right effusion. IMPRESSION: Right IJ central venous catheter tip is in the proximal SVC. No complicating features. Persistent bibasilar atelectasis and possible small right effusion. Electronically Signed By: Marijo Sanes M.D. On: 01/28/2015 15:25   Dg Chest Port 1 View  01/28/2015 CLINICAL DATA: Acute respiratory failure. EXAM: PORTABLE CHEST - 1 VIEW COMPARISON: January 27, 2015. FINDINGS: Stable cardiomediastinal silhouette. No pneumothorax is noted. Left lung is clear. New right basilar opacity is noted concerning for pneumonia or atelectasis, with possible associated pleural effusion. Left-sided PICC line is noted with distal tip overlying expected position of the SVC. IMPRESSION: New right basilar opacity is noted concerning for pneumonia or atelectasis with associated pleural effusion. Followup radiographs are recommended. Electronically Signed By: Marijo Conception, M.D. On: 01/28/2015 08:26   Dg Chest Port 1 View  01/27/2015 CLINICAL DATA: 69 year old female with sepsis, septic shock. Initial encounter. EXAM: PORTABLE CHEST - 1 VIEW COMPARISON: 01/24/2015 and earlier. FINDINGS: Portable AP upright view at 0802  hours. Stable left PICC line. Mildly improved lung volumes and bibasilar ventilation. Bilateral increased interstitial markings have regressed and now appear at baseline compared to [place] stands. No pneumothorax. No pleural effusion or consolidation. Stable cardiac size and mediastinal contours. IMPRESSION: Regressed bilateral interstitial opacity, lung parenchyma now all appears at baseline compared to 2015 studies. No new cardiopulmonary abnormality.  Electronically Signed By: Genevie Ann M.D. On: 01/27/2015 08:08   Dg Chest Port 1 View  01/24/2015 CLINICAL DATA: Wheezing and shortness of breath. EXAM: PORTABLE CHEST - 1 VIEW COMPARISON: 01/24/2015 FINDINGS: Left-sided PICC line tip overlies the superior vena cava. Heart is mildly prominent. There Kerley B-lines consistent with pulmonary edema. No focal consolidation. No overt alveolar edema. IMPRESSION: 1. Mild interstitial edema. 2. Left-sided PICC line tip to the superior vena cava. Electronically Signed By: Nolon Nations M.D. On: 01/24/2015 22:14   Dg Chest Port 1 View  01/24/2015 CLINICAL DATA: Hypoxia, pt was admitted with an acute URI and right armpit infection and was found to be in a-fib with RVR, hypovolemic, hyponatremic with an acute renal failure. EXAM: PORTABLE CHEST - 1 VIEW COMPARISON: Radiograph 01/23/2015 FINDINGS: Left-sided PICC line with tip in distal SVC. Stable cardiac silhouette. There is mild central venous congestion. No focal infiltrate. No pulmonary edema. Mild basilar atelectasis. IMPRESSION: Mild basilar atelectasis and minimal central venous congestion. No change from prior. Electronically Signed By: Suzy Bouchard M.D. On: 01/24/2015 09:44   Dg Chest Port 1 View  01/23/2015 CLINICAL DATA: Shortness of breath. EXAM: PORTABLE CHEST - 1 VIEW COMPARISON: Same day. FINDINGS: The heart size and mediastinal contours are within normal limits. Both lungs are clear. No pneumothorax or  pleural effusion is noted. Left-sided PICC line is noted with tip in expected position of cavoatrial junction. The visualized skeletal structures are unremarkable. IMPRESSION: No acute cardiopulmonary abnormality seen. Electronically Signed By: Marijo Conception, M.D. On: 01/23/2015 18:52   Dg Chest Port 1 View  01/23/2015 CLINICAL DATA: PICC line placement  EXAM: PORTABLE CHEST - 1 VIEW COMPARISON: CT scan of the chest same day FINDINGS: Cardiomediastinal silhouette is unremarkable. No acute infiltrate or pulmonary edema. There is a left arm PICC line with tip in right atrium. For distal SVC position the PICC line should be retracted about 1 cm. No pneumothorax. IMPRESSION: Left arm PICC line with tip in right atrium. For distal SVC position PICC line should be retracted about 1 cm. No pneumothorax. Electronically Signed By: Lahoma Crocker M.D. On: 01/23/2015 17:45   US Abdomen Limited Ruq  01/25/2015 CLINICAL DATA: Upper abdominal pain EXAM: US ABDOMEN LIMITED - RIGHT UPPER QUADRANT COMPARISON: None. FINDINGS: Gallbladder: There is a a focal gallstone measuring 1.9 cm in length which moves and shadows. Gallbladder wall is not thickened, and there is no pericholecystic fluid. No sonographic Murphy sign noted. Common bile duct: Diameter: 5 mm. No intrahepatic or extrahepatic biliary duct dilatation. Liver: No focal lesion identified. Liver echogenicity is diffusely increased. There is a small right pleural effusion. IMPRESSION: Cholelithiasis. Increased liver echogenicity, most likely due to hepatic steatosis. While no focal liver lesions are identified, it must be cautioned that the sensitivity of ultrasound for focal liver lesions is diminished in this circumstance. Small right pleural effusion noted. Electronically Signed By: Lowella Grip III M.D. On: 01/25/2015 12:15      Microbiology: Recent Results (from the past 240 hour(s))  Clostridium Difficile by  PCR Status: None   Collection Time: 02/01/15 4:59 PM  Result Value Ref Range Status   C difficile by pcr NEGATIVE NEGATIVE Final     Labs:  Lab Results Last 48 Hours    Results for orders placed or performed during the hospital encounter of 01/22/15 (from the past 48 hour(s))  Protime-INR Status: Abnormal   Collection Time: 02/08/15 5:00 AM  Result Value Ref Range   Prothrombin Time 15.7 (H) 11.6 - 15.2 seconds   INR 1.24 0.00 - 4.03  Basic metabolic panel Status: Abnormal   Collection Time: 02/08/15 5:00 AM  Result Value Ref Range   Sodium 139 135 - 145 mmol/L   Potassium 4.0 3.5 - 5.1 mmol/L   Chloride 105 96 - 112 mmol/L   CO2 23 19 - 32 mmol/L   Glucose, Bld 108 (H) 70 - 99 mg/dL   BUN 35 (H) 6 - 23 mg/dL   Creatinine, Ser 3.04 (H) 0.50 - 1.10 mg/dL   Calcium 8.8 8.4 - 10.5 mg/dL   GFR calc non Af Amer 15 (L) >90 mL/min   GFR calc Af Amer 17 (L) >90 mL/min    Comment: (NOTE) The eGFR has been calculated using the CKD EPI equation. This calculation has not been validated in all clinical situations. eGFR's persistently <90 mL/min signify possible Chronic Kidney Disease.    Anion gap 11 5 - 15  CBC Status: Abnormal   Collection Time: 02/08/15 11:35 AM  Result Value Ref Range   WBC 5.6 4.0 - 10.5 K/uL   RBC 2.56 (L) 3.87 - 5.11 MIL/uL   Hemoglobin 8.4 (L) 12.0 - 15.0 g/dL   HCT 25.3 (L) 36.0 - 46.0 %   MCV 98.8 78.0 - 100.0 fL   MCH 32.8 26.0 - 34.0 pg   MCHC 33.2 30.0 - 36.0 g/dL   RDW 14.2 11.5 - 15.5 %   Platelets 261 150 - 400 K/uL  Protime-INR Status: Abnormal   Collection Time: 02/09/15 4:25 AM  Result Value Ref Range   Prothrombin Time 16.3 (H) 11.6 - 15.2 seconds   INR  1.29 0.00 - 6.72  Basic metabolic panel Status: Abnormal   Collection Time: 02/09/15 4:25 AM  Result Value Ref Range    Sodium 135 135 - 145 mmol/L   Potassium 3.6 3.5 - 5.1 mmol/L   Chloride 104 96 - 112 mmol/L   CO2 22 19 - 32 mmol/L   Glucose, Bld 104 (H) 70 - 99 mg/dL   BUN 31 (H) 6 - 23 mg/dL   Creatinine, Ser 2.81 (H) 0.50 - 1.10 mg/dL   Calcium 8.6 8.4 - 10.5 mg/dL   GFR calc non Af Amer 16 (L) >90 mL/min   GFR calc Af Amer 19 (L) >90 mL/min    Comment: (NOTE) The eGFR has been calculated using the CKD EPI equation. This calculation has not been validated in all clinical situations. eGFR's persistently <90 mL/min signify possible Chronic Kidney Disease.    Anion gap 9 5 - 15  CBC Status: Abnormal   Collection Time: 02/09/15 4:25 AM  Result Value Ref Range   WBC 5.7 4.0 - 10.5 K/uL   RBC 2.44 (L) 3.87 - 5.11 MIL/uL   Hemoglobin 8.1 (L) 12.0 - 15.0 g/dL   HCT 24.4 (L) 36.0 - 46.0 %   MCV 100.0 78.0 - 100.0 fL   MCH 33.2 26.0 - 34.0 pg   MCHC 33.2 30.0 - 36.0 g/dL   RDW 14.4 11.5 - 15.5 %   Platelets 237 150 - 400 K/uL  Glucose, capillary Status: Abnormal   Collection Time: 02/09/15 6:18 AM  Result Value Ref Range   Glucose-Capillary 121 (H) 70 - 99 mg/dL   Comment 1 Notify RN    Comment 2 Document in Chart        Patient is a 69 year old female with paroxysmal atrial fibrillation, hypertension, obesity presented with 2 day history of nausea, vomiting, generalized malaise, diarrhea, palpitations and fever. She had vomited several times and was unable to hold down her home medications. Lactic acid was 3.6 at the time of admission. CT chest showed inflammation in the right axilla extending inferiorly along the right chest but no focal evidence of abscess or gas. Patient was admitted to stepdown unit for further workup. BMET at the time of admission showed sodium of 127, creatinine 1.97, bicarbonate 16.   Assessment & Plan   Principal problem Septic shock due to Group  A strep cellulitis, erysipelas / chest wall induration with Streptococcus bacteremia  - Improved The patient was admitted with acute viral gastroenteritis and lactic acidosis. CT chest showed inflammation in the right axilla extending inferiorly along the right chest, no focal evidence of abscess or gas. CT abdomen and pelvis showed no renal obstruction, gallbladder distention with possible gallstones. Renal ultrasound showed no hydronephrosis, cholelithiasis however small left pleural effusion. Patient however became hypotensive, tachycardiac with respiratory distress and PCCM was consulted on 4/11. Patient required critical care support for vasopressors and was off the pressors on 4/13. HIDA scan showed a delayed filling of gallbladder, no evidence of biliary obstruction. MRI of the flank showed no focal pockets of infection/abscess. ID, Dr. Linus Salmons signed off on 4/18, recommended to continue IV ceftriaxone through 4/24, patient has completed. - Patient had initially complained of some shoulder pain which has now resolved and per ID, no indication for MRI.   Anemia- normocytic, likely due to chronic disease:  - Stool occult test +, aspirin and heparin subcutaneous was discontinued. Patient was started on Coumadin on 4/22 however hemoglobin has been trending down.  - GI consulted, EGD- Normal  appearing esophagus and GE junction, the stomach was well visualized and normal in appearance, normal appearing duodenum Hemoglobin stable last several days GI recommends stay off NSAIDs, possible colonoscopy capsule study outpatient   Persistent Atrial Fibrillation :  - Cardiology following, rate controlled, off of IV Cardizem drip  - CHADS2VASC 3 - cardiology recommending anticoagulation, not a candidate for NOAC due to renal failure. As above, Coumadin was started on 4/22 , INR is still subtherapeutic on Zebeta 10 mg and Diltiazem 180 mg BID now (increased 4/25) for the most part well rate controlled  (<110).  Atrial fibrillation better controlled Monitor INR on a daily basis and adjust Coumadin dosing based on INR levels   ?Viral Gastroenteritis - Now resolved  Acute Kidney Injury - improving - Likely due to sepsis/septic shock, atrial fibrillation, hypotension, NSAIDs. - Nephrology following closely, HD catheter removed, Foley catheter removed - Outpatient follow-up with nephrology  Acute pulm edema - resolved  HSV1 exacerbation  Appears to be resolving - acyclovir initiated 4/19   Hyponatremia  -Resolved  Thrombocytopenia  Likely due to consumptive colopathy secondary to sepsis, resolved   Abnormal Myoview  Obtained in March 2016 -reviewed cardiology recommendations, currently no chest pain, cardiac enzymes negative. Defer further evaluation due to renal and additional significant comorbidities.  Obesity - Body mass index is 36.77 kg/(m^2).  - patient counseled on diet and weight control   High D-dimer  - doubt PE, CT angiogram not possible due to renal function, Doppler lower extremity negative for DVT  - holding coumadin  Suspected OSA  Saturations remaining stable - will need outpatient sleep study   Code Status:Full code              The patient needs to address  chronic palpitations, alopecia  BP Readings from Last 3 Encounters:  02/15/15 150/80  02/10/15 133/92  02/10/15 118/80   Wt Readings from Last 3 Encounters:  02/15/15 221 lb (100.245 kg)  02/10/15 229 lb 0.6 oz (103.892 kg)  02/09/15 229 lb 0.9 oz (103.9 kg)       Review of Systems  Constitutional: Negative for chills, activity change, appetite change, fatigue and unexpected weight change.  HENT: Negative for congestion, mouth sores and sinus pressure.   Eyes: Negative for visual disturbance.  Respiratory: Negative for cough and chest tightness.   Cardiovascular: Positive for palpitations. Negative for leg swelling.  Gastrointestinal: Negative for nausea and  abdominal pain.  Genitourinary: Negative for dysuria, urgency, frequency, hematuria, difficulty urinating and vaginal pain.  Musculoskeletal: Negative for myalgias, back pain and gait problem.  Skin: Negative for pallor and rash.       Hair loss  Allergic/Immunologic: Positive for environmental allergies.  Neurological: Negative for dizziness, tremors, weakness, numbness and headaches.  Psychiatric/Behavioral: Negative for suicidal ideas, confusion and sleep disturbance.       Objective:   Physical Exam  Constitutional: She appears well-developed. No distress.  Obese   HENT:  Head: Normocephalic.  Right Ear: External ear normal.  Left Ear: External ear normal.  Nose: Nose normal.  Mouth/Throat: Oropharynx is clear and moist.  Eyes: Conjunctivae are normal. Pupils are equal, round, and reactive to light. Right eye exhibits no discharge. Left eye exhibits no discharge.  Neck: Normal range of motion. Neck supple. No JVD present. No tracheal deviation present. No thyromegaly present.  Cardiovascular: Normal rate, regular rhythm and normal heart sounds.   No murmur heard. Pulmonary/Chest: No stridor. No respiratory distress. She has no wheezes. She has no rales.  Abdominal: Soft. Bowel sounds are normal. She exhibits no distension and no mass. There is no tenderness. There is no rebound and no guarding.  Musculoskeletal: She exhibits no edema or tenderness.  Lymphadenopathy:    She has no cervical adenopathy.  Neurological: She displays normal reflexes. No cranial nerve deficit. She exhibits normal muscle tone. Coordination normal.  Skin: No rash noted. No erythema.  alopecia  Psychiatric: She has a normal mood and affect. Her behavior is normal. Judgment and thought content normal.   Lab Results  Component Value Date   WBC 5.7 02/09/2015   HGB 8.1* 02/09/2015   HCT 24.4* 02/09/2015   PLT 237 02/09/2015   GLUCOSE 104* 02/09/2015   CHOL 178 11/11/2014   TRIG 160.0* 11/11/2014    HDL 68.70 11/11/2014   LDLCALC 77 11/11/2014   ALT 23 02/02/2015   AST 23 02/02/2015   NA 135 02/09/2015   K 3.6 02/09/2015   CL 104 02/09/2015   CREATININE 2.81* 02/09/2015   BUN 31* 02/09/2015   CO2 22 02/09/2015   TSH 2.917 01/23/2015   INR 1.29 02/09/2015   HGBA1C 5.8* 01/30/2015           Assessment & Plan:

## 2015-02-15 NOTE — Assessment & Plan Note (Addendum)
  Bisoprolol, Cardizem  5/16 - Coumadin

## 2015-02-15 NOTE — Progress Notes (Signed)
Agree with plan 

## 2015-02-16 ENCOUNTER — Telehealth: Payer: Self-pay | Admitting: *Deleted

## 2015-02-16 NOTE — Telephone Encounter (Signed)
Yes she was for one night, at the time of the last visit.  I would not agree to "releasing" her from the facility  She intended to seek out PCP as well regarding this issue.  I have no knowledge of what transpired later.

## 2015-02-16 NOTE — Telephone Encounter (Signed)
Called Dawn back she is needing to know was the patient in rehab facility. She is unclear due to Dr. Jenny Reichmann tcm ov. Will forward msg to ask md. Dr. Jenny Reichmann do you know if this pt was in rehab facility??.../lmb

## 2015-02-16 NOTE — Telephone Encounter (Signed)
-----   Message from Philbert Riser sent at 02/16/2015  9:03 AM EDT ----- Regarding: TCM Hi Lynnda Wiersma,   I was hoping you could call me on above patient to discuss TCMs.   Thank you,  Aron Baba, [facility], [place] [facility] Coding Support Consultant  Ph: [phone] Ext. [phone] Email; [email or web address]

## 2015-02-16 NOTE — Telephone Encounter (Signed)
Notified Dawn with md response...Johny Chess

## 2015-02-17 ENCOUNTER — Encounter: Payer: Self-pay | Admitting: Internal Medicine

## 2015-02-17 ENCOUNTER — Other Ambulatory Visit (INDEPENDENT_AMBULATORY_CARE_PROVIDER_SITE_OTHER): Payer: Medicare Other

## 2015-02-17 ENCOUNTER — Ambulatory Visit (INDEPENDENT_AMBULATORY_CARE_PROVIDER_SITE_OTHER): Payer: Medicare Other | Admitting: Internal Medicine

## 2015-02-17 ENCOUNTER — Telehealth: Payer: Self-pay | Admitting: Internal Medicine

## 2015-02-17 VITALS — BP 140/70 | HR 81 | Temp 98.2°F | Resp 15 | Ht 66.0 in | Wt 214.2 lb

## 2015-02-17 DIAGNOSIS — D6489 Other specified anemias: Secondary | ICD-10-CM

## 2015-02-17 DIAGNOSIS — N289 Disorder of kidney and ureter, unspecified: Secondary | ICD-10-CM

## 2015-02-17 DIAGNOSIS — I10 Essential (primary) hypertension: Secondary | ICD-10-CM | POA: Insufficient documentation

## 2015-02-17 LAB — CBC WITH DIFFERENTIAL/PLATELET
BASOS ABS: 0.1 10*3/uL (ref 0.0–0.1)
BASOS PCT: 1 % (ref 0.0–3.0)
EOS PCT: 4.8 % (ref 0.0–5.0)
Eosinophils Absolute: 0.4 10*3/uL (ref 0.0–0.7)
HEMATOCRIT: 32.9 % — AB (ref 36.0–46.0)
HEMOGLOBIN: 11.6 g/dL — AB (ref 12.0–15.0)
LYMPHS PCT: 27 % (ref 12.0–46.0)
Lymphs Abs: 2.3 10*3/uL (ref 0.7–4.0)
MCHC: 35.3 g/dL (ref 30.0–36.0)
MCV: 95.6 fl (ref 78.0–100.0)
MONOS PCT: 6 % (ref 3.0–12.0)
Monocytes Absolute: 0.5 10*3/uL (ref 0.1–1.0)
NEUTROS ABS: 5.1 10*3/uL (ref 1.4–7.7)
Neutrophils Relative %: 61.2 % (ref 43.0–77.0)
Platelets: 315 10*3/uL (ref 150.0–400.0)
RBC: 3.44 Mil/uL — AB (ref 3.87–5.11)
RDW: 14.9 % (ref 11.5–15.5)
WBC: 8.3 10*3/uL (ref 4.0–10.5)

## 2015-02-17 LAB — BASIC METABOLIC PANEL
BUN: 13 mg/dL (ref 6–23)
CALCIUM: 9.5 mg/dL (ref 8.4–10.5)
CO2: 25 mEq/L (ref 19–32)
CREATININE: 1.22 mg/dL — AB (ref 0.40–1.20)
Chloride: 103 mEq/L (ref 96–112)
GFR: 46.56 mL/min — ABNORMAL LOW (ref >60.00)
Glucose, Bld: 104 mg/dL — ABNORMAL HIGH (ref 70–99)
Potassium: 4.1 mEq/L (ref 3.5–5.1)
Sodium: 136 mEq/L (ref 135–145)

## 2015-02-17 NOTE — Telephone Encounter (Signed)
Patient concerned about her blood pressure being too high. When waking up this morning at 8:00 am BP 137/99, after taking morning BP med. 138/99, and most recent BP 148/94  HR 93. Patient woke up with a headache, which patient states she never has headaches. Patient also has dizziness and feels unstable when walking. Patient denies any chest pain, SOB or weakness in one side or the other. Patient has not had anything to eat today. Encouraged patient to eat something light, to make sure her blood sugar is not too low. Will consult Ignacia Bayley NP (FLEX) in mean time. Called patient back. Patient has appointment with PCP today for elevated BP, and patient has a follow-up appointment with Dr. [name] on 5/27. Patient states she feels better after having some eggs and toast. Ignacia Bayley NP, does not feel that patient needs to be seen today, since she will be seeing PCP and to keep her appointment with Dr. [name]. Patient verbalized understanding.

## 2015-02-17 NOTE — Telephone Encounter (Signed)
New Message   Pt c/o BP issue: STAT if pt c/o blurred vision, one-sided weakness or slurred speech  1. What are your last 5 BP readings? 137/99, 137/106, 138/99  2. Are you having any other symptoms (ex. Dizziness, headache, blurred vision, passed out)? Headache and dizziness  3. What is your BP issue? It is very high and she feels she needs to come in and see someone.

## 2015-02-17 NOTE — Progress Notes (Signed)
   Subjective:    Patient ID: Amanda Wilkins, female    DOB: 09-Jan-1946, 69 y.o.   MRN: [id number]  HPI This morning she awoke with headache over the anterior crown ; this prompted her to check her blood pressure. Initially it was 147/106 ; 30 minutes later it was 147/99. She has not been monitoring the blood pressure at home. She does restrict salt.  She was hospitalized 4/9-4/27/16 with  atrial fibrillation with rapid ventricular response in the context of sepsis with acute renal insufficiency. Those records were reviewed. Her creatinine was as high as 3.93on 4/20.  On 02/09/15 creatinine was 2.81 , BUN 31, and GFR 16. Hemoglobin was 8.1 and hematocrit 24.4.   She does have some exertional dyspnea ; she denies other cardio vascular symptoms.    Review of Systems    Chest pain, palpitations, tachycardia, paroxysmal nocturnal dyspnea, claudication or edema are absent.       Objective:   Physical Exam   Pertinent or positive findings include :  She has some arteriolar narrowing on fundal exam without hemorrhages or exudates.  Decreased dorsalis pedis pulses.  Appears healthy and well-nourished & in no acute distress No carotid bruits are present.No neck vein distention present at 10 - 15 degrees. Thyroid normal to palpation Heart rhythm and rate are normal with no gallop or murmur Chest is clear with no increased work of breathing There is no evidence of aortic aneurysm or renal artery bruits Abdomen soft with no organomegaly or masses. No HJR No clubbing, cyanosis or edema present. All pulses are equal w/o bruit No ischemic skin changes are present . Fingernails healthy  Alert and oriented. Strength, tone, DTRs reflexes normal        Assessment & Plan:   #1 hypertension ; at this time blood pressure is upper limits of normal with reading of140/70    #2 renal insufficiency  #3 anemia   Plan: See orders and recommendations

## 2015-02-17 NOTE — Patient Instructions (Addendum)
Minimal Blood Pressure Goal= AVERAGE < 140/90;  Ideal is an AVERAGE < 135/85. This AVERAGE should be calculated from @ least 5-7 BP readings taken @ different times of day on different days of week. You should not respond to isolated BP readings , but rather the AVERAGE for that week .Please bring your  blood pressure cuff to office visits to verify that it is reliable.It  can also be checked against the blood pressure device at the pharmacy. Finger or wrist cuffs are not dependable; an arm cuff is. Your next office appointment will be determined based upon review of your pending labs . Those instructions will be transmitted to you by My Chart Critical results will be called.  Followup as needed for any active or acute issue. Please report any significant change in your symptoms. 

## 2015-02-17 NOTE — Progress Notes (Signed)
Pre visit review using our clinic review tool, if applicable. No additional management support is needed unless otherwise documented below in the visit note. 

## 2015-02-18 ENCOUNTER — Telehealth: Payer: Self-pay | Admitting: Internal Medicine

## 2015-02-18 NOTE — Telephone Encounter (Signed)
Patient has been advised

## 2015-02-18 NOTE — Telephone Encounter (Signed)
Patient calling regarding lab results.

## 2015-02-21 ENCOUNTER — Telehealth: Payer: Self-pay | Admitting: Internal Medicine

## 2015-02-21 NOTE — Telephone Encounter (Signed)
Called patient  She is home  Feeling OK  Has had pulse taken by Tia Masker and W Hopper  Regular.  I told her I would recomm that she stay on coumadin  When she left the hospital she was not acutely ill  Still in afib   PAF with signif risk for CVA    Have made appt for her to see me on 5/20 (not 5/27)  Patient agreeable to plan/recommendations

## 2015-02-22 ENCOUNTER — Ambulatory Visit (INDEPENDENT_AMBULATORY_CARE_PROVIDER_SITE_OTHER): Payer: Medicare Other | Admitting: General Practice

## 2015-02-22 ENCOUNTER — Ambulatory Visit: Payer: Medicare Other | Attending: Internal Medicine | Admitting: Rehabilitative and Restorative Service Providers"

## 2015-02-22 DIAGNOSIS — Z7901 Long term (current) use of anticoagulants: Secondary | ICD-10-CM | POA: Diagnosis not present

## 2015-02-22 DIAGNOSIS — I4891 Unspecified atrial fibrillation: Secondary | ICD-10-CM

## 2015-02-22 DIAGNOSIS — R269 Unspecified abnormalities of gait and mobility: Secondary | ICD-10-CM

## 2015-02-22 DIAGNOSIS — R29898 Other symptoms and signs involving the musculoskeletal system: Secondary | ICD-10-CM | POA: Insufficient documentation

## 2015-02-22 DIAGNOSIS — Z5181 Encounter for therapeutic drug level monitoring: Secondary | ICD-10-CM | POA: Diagnosis not present

## 2015-02-22 LAB — POCT INR: INR: 1.8

## 2015-02-22 NOTE — Progress Notes (Signed)
Pre visit review using our clinic review tool, if applicable. No additional management support is needed unless otherwise documented below in the visit note. 

## 2015-02-22 NOTE — Progress Notes (Signed)
Agree with plan 

## 2015-02-22 NOTE — Therapy (Signed)
[facility] [address] [place], [place], [address] Phone: [phone]   Fax:  [phone]  Physical Therapy Evaluation  Patient Details  Name: Amanda Wilkins MRN: [id number] Date of Birth: 01-Nov-1945 Referring Provider:  Tresa Garter, MD  Encounter Date: 02/22/2015      PT End of Session - 02/22/15 1405    Visit Number 1   Number of Visits 6   Date for PT Re-Evaluation 04/08/15   Authorization Type G code every 10th visit   PT Start Time 1103   PT Stop Time 1150   PT Time Calculation (min) 47 min   Activity Tolerance Patient tolerated treatment well   Behavior During Therapy [facility] for tasks assessed/performed      Past Medical History  Diagnosis Date  . Allergy   . Atrial fibrillation   . Migraines     on Zoloft for migraines  . Hypertension   . Chronic interstitial nephritis   . Depression   . Palpitations   . Obesity   . Arthritis     ON BOTH KNEES    Past Surgical History  Procedure Laterality Date  . Cesarean section    . Dental surgery      multiple implants  . Repair of torn meniscus on left 08/2014 Left 08/2015    Dr. Sherlean Foot at [facility]  . Esophagogastroduodenoscopy N/A 02/08/2015    Procedure: ESOPHAGOGASTRODUODENOSCOPY (EGD);  Surgeon: Louis Meckel, MD;  Location: [facility] ENDOSCOPY;  Service: Endoscopy;  Laterality: N/A;    There were no vitals filed for this visit.  Visit Diagnosis:  Abnormality of gait  Muscular deconditioning      Subjective Assessment - 02/22/15 1112    Subjective The patient was admitted to [facility] 01/22/2015 and d/c to [facility] on 02/09/2015- chart review revealed a-fib, kidney failure (had dialysis in hospital), and sepsis.  She only remained at SNF x 4 days due to unhappiness in facility.  She then d/c home.  She reports since d/c home, she is having difficulty with standing for longer periods (this is improving slowly on its own), unable to tolerate  longer walking.   Pertinent History recent hospitalization   Patient Stated Goals Return to prior exercise routine (with personal trainer), improve strength, endurance, and balance.   Currently in Pain? No/denies            [facility] PT Assessment - 02/22/15 1122    Assessment   Medical Diagnosis paroxysmal a-fib, sepsis, kidney failure, prolonged hospitalization   Onset Date 01/22/15   Prior Therapy acute care   Precautions   Precautions --  not driving, using shower chair   Balance Screen   Has the patient fallen in the past 6 months No   Has the patient had a decrease in activity level because of a fear of falling?  No   Is the patient reluctant to leave their home because of a fear of falling?  No   Home Environment   Living Enviornment Private residence   Home Access Stairs to enter   Entrance Stairs-Number of Steps 4   Home Layout One level   Prior Function   Level of Independence Independent with homemaking with ambulation  working out 2 times/week with trainer   Observation/Other Assessments   Other Surveys  --  52.5% ABC scale   Sensation   Light Touch Appears Intact   ROM / Strength   AROM / PROM / Strength AROM;Strength   AROM  Overall AROM  Within functional limits for tasks performed   Strength   Overall Strength Comments 4/5 bilateral shoulder flexion and abduction, 4/5 bilateral hip flexion and knee flexion/extension, 3/5 bilateral ankle dorsiflexion   Ambulation/Gait   Ambulation/Gait Yes   Ambulation/Gait Assistance 7: Independent   Ambulation Distance (Feet) 500 Feet   Ambulation Surface Level   Gait velocity 2.70 ft/sec   Stairs Yes   Stairs Assistance 6: Modified independent (Device/Increase time)   Number of Stairs 4   Gait Comments 3 min walk test=442 ft with rest at 2 min, 30 sec   Standardized Balance Assessment   Standardized Balance Assessment Berg Balance Test;Timed Up and Go Test   Berg Balance Test   Sit to Stand Able to stand without  using hands and stabilize independently   Standing Unsupported Able to stand safely 2 minutes   Sitting with Back Unsupported but Feet Supported on Floor or Stool Able to sit safely and securely 2 minutes   Stand to Sit Sits safely with minimal use of hands   Transfers Able to transfer safely, minor use of hands   Standing Unsupported with Eyes Closed Able to stand 10 seconds safely   Standing Ubsupported with Feet Together Able to place feet together independently and stand 1 minute safely   From Standing, Reach Forward with Outstretched Arm Can reach confidently >25 cm (10")   From Standing Position, Pick up Object from Floor Able to pick up shoe safely and easily   From Standing Position, Turn to Look Behind Over each Shoulder Looks behind from both sides and weight shifts well   Turn 360 Degrees Able to turn 360 degrees safely in 4 seconds or less   Standing Unsupported, Alternately Place Feet on Step/Stool Able to stand independently and safely and complete 8 steps in 20 seconds   Standing Unsupported, One Foot in Front Able to plae foot ahead of the other independently and hold 30 seconds   Standing on One Leg Tries to lift leg/unable to hold 3 seconds but remains standing independently   Total Score 52             PT Education - 02/22/15 1404    Education provided Yes   Education Details Deficits that would be addressed by physical therapy and recommended wait on exercising in pool (due to fatigue) and recommended return to working with personal trainer to her tolerance, as long as cleared by MD.   [place]) Educated Patient;Spouse   Methods Explanation   Comprehension Verbalized understanding          PT Short Term Goals - 02/22/15 1405    PT SHORT TERM GOAL #1   Title The patient will return demo HEP for ankle strength, general endurance and home walking.   Baseline Target date 03/25/2015   Time 4   Period Weeks   PT SHORT TERM GOAL #2   Title The patient will improve  gait speed from 2.7 ft/sec up to 3.2 ft/sec to demo improving community mobility.   Baseline Target date 03/25/2015   Time 4   Period Weeks   PT SHORT TERM GOAL #3   Title The patient will ambulate for 5 minutes nonstop for improved endurance (2.5 min at baseline).   Baseline Target date 03/25/2015   Time 4   Period Weeks           PT Long Term Goals - 02/22/15 1407    PT LONG TERM GOAL #1   Title The  patient will improve > or equal to 20% on ABC scale from 52.5% baseline.   Baseline Target date 03/25/2015   Time 6   Period Weeks   PT LONG TERM GOAL #2   Title The patient will maintain single limb stance x 5 seconds on R and L LEs for improved balance control.   Baseline Target date 03/25/2015   Time 6   Period Weeks   PT LONG TERM GOAL #3   Title The patient will ambulate >550 ft in 3 minutes (from 442 ft at eval) to demo improving endurance/mobility.    Baseline Target date 03/25/2015   Time 6   Period Weeks   PT LONG TERM GOAL #4   Title The patient will ambulate for 10 minutes nonstop to demo improved endurance for limited community mobility.   Baseline TArget date 03/25/2015   Time 6   Period Weeks               Plan - 06-Mar-2015 1411    Clinical Impression Statement The patient is a 69 yo female s/p recent hospitalization and deconditioning.  She presents with LE weakness noted in ankles, decreased endurance for extended ambulation, and general fatigue.   PT to address deficits through progressive home program and education for patient to return to prior level of function.   Pt will benefit from skilled therapeutic intervention in order to improve on the following deficits Abnormal gait;Decreased activity tolerance;Decreased balance;Decreased mobility;Decreased strength;Difficulty walking;Postural dysfunction;Decreased endurance   Rehab Potential Good   PT Frequency 1x / week   PT Duration 6 weeks   PT Treatment/Interventions Therapeutic activities;Patient/family  education;Therapeutic exercise;Gait training;Balance training;Neuromuscular re-education;Functional mobility training   PT Next Visit Plan Instruct in written HEP, written walking program, focusing on ankle strength, single limb stance, and endurance.   Consulted and Agree with Plan of Care Patient          G-Codes - 03/06/15 1415    Functional Assessment Tool Used Berg=52/56, ABC scale=52.5%, 3 min walk=442 ft.   Functional Limitation Mobility: Walking and moving around   Mobility: Walking and Moving Around Current Status [phone]) At least 20 percent but less than 40 percent impaired, limited or restricted   Mobility: Walking and Moving Around Goal Status [phone]) At least 1 percent but less than 20 percent impaired, limited or restricted       Problem List Patient Active Problem List   Diagnosis Date Noted  . Encounter for therapeutic drug monitoring 03/06/2015  . Essential hypertension 02/17/2015  . Physical deconditioning 02/15/2015  . Anticoagulation monitoring, INR range 2-3 02/15/2015  . General weakness 02/12/2015  . Absolute anemia   . Septic shock due to streptococcal infection   . Atelectasis   . Acute pulmonary edema   . Persistent atrial fibrillation   . Acute kidney injury   . Hyponatremia   . Hypokalemia   . Hyperglycemia   . Elevated d-dimer   . OSA (obstructive sleep apnea)   . Shoulder pain   . Gallstones   . Acute respiratory failure   . HSV-1 (herpes simplex virus 1) infection   . D-dimer, elevated   . Cholecystitis   . DIC (disseminated intravascular coagulation)   . Group A streptococcal infection   . Cellulitis   . Abdominal pain   . Axillary pain   . Dyspnea   . Hypoxia   . Thrombocytopenia   . Transaminitis   . Hyperbilirubinemia   . FUO (fever of unknown origin)   .  Acute renal insufficiency   . Dehydration 01/22/2015  . Atrial fibrillation with RVR 01/22/2015  . Breast pain, right 01/22/2015  . A-fib 01/22/2015  . Fever 01/22/2015  .  Nausea & vomiting 01/22/2015  . Dyspnea on exertion 11/30/2014  . Elevated MCV 05/25/2014  . Snoring 12/09/2013  . Otitis, externa, infective 04/23/2013  . Post-menopausal bleeding 02/10/2013  . Post-vaccination reaction 01/16/2013  . Increased endometrial stripe thickness 01/16/2013  . Weight gain 01/06/2013  . Symptomatic menopausal or female climacteric states 01/06/2013  . History of ovarian cyst 01/06/2013  . Mouth ulcer 06/09/2012  . LBP (low back pain) 05/09/2012  . Dermatitis of ear canal 05/09/2012  . Upper respiratory disease 10/22/2011  . Menopausal symptoms 06/22/2011  . Alopecia, unspecified 02/11/2011  . CONJUNCTIVITIS, ACUTE 10/18/2010  . RENAL CYST 11/11/2009  . TOBACCO USE, QUIT 10/12/2009  . HIP PAIN, LEFT 08/04/2009  . UNSPECIFIED MYALGIA AND MYOSITIS 04/12/2009  . VISION IMPAIRMENT, LOW VISION, ONE EYE-LEFT 06/23/2008  . BLEPHARITIS 06/23/2008  . Headache(784.0) 06/23/2008  . SWEATING 04/07/2008  . SYNCOPE 02/17/2008  . COUGH 11/14/2007  . PAP SMEAR, ABNORMAL 11/14/2007  . ALLERGIC RHINITIS 08/14/2007  . DEPRESSION 07/28/2007  . Atrial fibrillation 07/28/2007  . Palpitations 07/28/2007    Azavion Bouillon, PT 02/22/2015, 2:17 PM  [facility] [address] [place], [place], [address] Phone: [phone]   Fax:  [phone]

## 2015-02-23 ENCOUNTER — Other Ambulatory Visit: Payer: Self-pay | Admitting: Adult Health

## 2015-02-23 NOTE — Assessment & Plan Note (Signed)
Bisoprolol, Cardizem

## 2015-02-24 ENCOUNTER — Other Ambulatory Visit: Payer: Self-pay | Admitting: General Practice

## 2015-02-24 MED ORDER — WARFARIN SODIUM 5 MG PO TABS: ORAL_TABLET | ORAL | Status: DC

## 2015-02-28 ENCOUNTER — Telehealth: Payer: Self-pay | Admitting: General Practice

## 2015-02-28 ENCOUNTER — Telehealth: Payer: Self-pay | Admitting: Internal Medicine

## 2015-02-28 NOTE — Telephone Encounter (Signed)
LMOVM

## 2015-02-28 NOTE — Telephone Encounter (Signed)
Pt would like cindy boyd to return her call

## 2015-03-01 ENCOUNTER — Ambulatory Visit: Payer: Medicare Other | Admitting: Rehabilitative and Restorative Service Providers"

## 2015-03-01 ENCOUNTER — Ambulatory Visit: Payer: Medicare Other | Admitting: Gynecology

## 2015-03-01 ENCOUNTER — Ambulatory Visit (INDEPENDENT_AMBULATORY_CARE_PROVIDER_SITE_OTHER): Payer: Medicare Other | Admitting: General Practice

## 2015-03-01 DIAGNOSIS — I4891 Unspecified atrial fibrillation: Secondary | ICD-10-CM

## 2015-03-01 DIAGNOSIS — N179 Acute kidney failure, unspecified: Secondary | ICD-10-CM | POA: Diagnosis not present

## 2015-03-01 DIAGNOSIS — R269 Unspecified abnormalities of gait and mobility: Secondary | ICD-10-CM | POA: Diagnosis not present

## 2015-03-01 DIAGNOSIS — R29898 Other symptoms and signs involving the musculoskeletal system: Secondary | ICD-10-CM | POA: Diagnosis not present

## 2015-03-01 DIAGNOSIS — R300 Dysuria: Secondary | ICD-10-CM | POA: Diagnosis not present

## 2015-03-01 DIAGNOSIS — Z5181 Encounter for therapeutic drug level monitoring: Secondary | ICD-10-CM

## 2015-03-01 LAB — POCT INR: INR: 2.5

## 2015-03-01 NOTE — Progress Notes (Signed)
Agree with plan 

## 2015-03-01 NOTE — Progress Notes (Signed)
Pre visit review using our clinic review tool, if applicable. No additional management support is needed unless otherwise documented below in the visit note. 

## 2015-03-01 NOTE — Therapy (Signed)
[facility] [address] [place], [place], [address] Phone: [phone]   Fax:  [phone]  Physical Therapy Treatment  Patient Details  Name: Amanda Wilkins MRN: [id number] Date of Birth: Aug 24, 1946 Referring Provider:  Tresa Garter, MD  Encounter Date: 03/01/2015      PT End of Session - 03/01/15 1349    Visit Number 2   Number of Visits 6   Date for PT Re-Evaluation 04/08/15   Authorization Type G code every 10th visit   PT Start Time 1018   PT Stop Time 1104   PT Time Calculation (min) 46 min   Equipment Utilized During Treatment Gait belt   Activity Tolerance Patient tolerated treatment well   Behavior During Therapy [facility] for tasks assessed/performed      Past Medical History  Diagnosis Date  . Allergy   . Atrial fibrillation   . Migraines     on Zoloft for migraines  . Hypertension   . Chronic interstitial nephritis   . Depression   . Palpitations   . Obesity   . Arthritis     ON BOTH KNEES    Past Surgical History  Procedure Laterality Date  . Cesarean section    . Dental surgery      multiple implants  . Repair of torn meniscus on left 08/2014 Left 08/2015    Dr. Sherlean Foot at [facility]  . Esophagogastroduodenoscopy N/A 02/08/2015    Procedure: ESOPHAGOGASTRODUODENOSCOPY (EGD);  Surgeon: Louis Meckel, MD;  Location: [facility] ENDOSCOPY;  Service: Endoscopy;  Laterality: N/A;    There were no vitals filed for this visit.  Visit Diagnosis:  Abnormality of gait  Muscular deconditioning      Subjective Assessment - 03/01/15 1021    Subjective The patient reports that she is moving more, but she notes weakness in her legs with standing for longer periods.  She is scheduled to see her trainer this week.    Currently in Pain? No/denies      NEUROMUSCULAR RE-EDUCATION: Single limb stance near countertop for support Tandem stance without difficulty x 30 seconds R/L sides with arm across  chest for increased challenge  Gait: Heel walking x 15 ft with supervision for support Toe walking x 15 ft with supervision for support Gait for extended time for endurance and general household/community mobility x 7.5 minutes without rest break independently  THERAPEUTIC EXERCISE: Heel raises x 20 reps Toe raises x 20 reps Discussed progression of activities for work with Systems analyst   *discussed at length rationale for each exercise and impairment it is addressing. Patient had questions re: tolerance to activity and we discussed listening to her body, resting at point of fatigue and discussed how this should continue to improve over time. Patient inquired about neuropsych services and PT recommended she discuss further with MD.  She reports feeling increased stress and anxiety since hospitalization and would like to receive counseling.       PT Education - 03/01/15 1344    Education provided Yes   Education Details HEP: heel raises, toe raises, single limb stance, heel walking + toe walking, walking program provided   Person(s) Educated Patient   Methods Explanation;Demonstration;Handout   Comprehension Returned demonstration;Verbalized understanding          PT Short Term Goals - 03/01/15 1349    PT SHORT TERM GOAL #1   Title The patient will return demo HEP for ankle strength, general endurance and home walking.   Baseline Target  date 03/25/2015   Time 4   Period Weeks   PT SHORT TERM GOAL #2   Title The patient will improve gait speed from 2.7 ft/sec up to 3.2 ft/sec to demo improving community mobility.   Baseline Target date 03/25/2015   Time 4   Period Weeks   PT SHORT TERM GOAL #3   Title The patient will ambulate for 5 minutes nonstop for improved endurance (2.5 min at baseline).   Baseline Met on 03/01/2015 with patinet ambulating 7.5 minutes nonstop.   Time 4   Period Weeks   Status Achieved           PT Long Term Goals - 02/22/15 1407    PT LONG  TERM GOAL #1   Title The patient will improve > or equal to 20% on ABC scale from 52.5% baseline.   Baseline Target date 03/25/2015   Time 6   Period Weeks   PT LONG TERM GOAL #2   Title The patient will maintain single limb stance x 5 seconds on R and L LEs for improved balance control.   Baseline Target date 03/25/2015   Time 6   Period Weeks   PT LONG TERM GOAL #3   Title The patient will ambulate >550 ft in 3 minutes (from 442 ft at eval) to demo improving endurance/mobility.    Baseline Target date 03/25/2015   Time 6   Period Weeks   PT LONG TERM GOAL #4   Title The patient will ambulate for 10 minutes nonstop to demo improved endurance for limited community mobility.   Baseline TArget date 03/25/2015   Time 6   Period Weeks               Plan - 03/01/15 1350    Clinical Impression Statement The patient met STG #3 for ambulation > 5 minutes.  She is making gains by performing greater household mobility.  Patient tolerated HEP well.  Continue to other STGs/LTGs.   PT Next Visit Plan Check HEP, progress balance and gait activities to tolerance.   Consulted and Agree with Plan of Care Patient        Problem List Patient Active Problem List   Diagnosis Date Noted  . Encounter for therapeutic drug monitoring 02/22/2015  . Essential hypertension 02/17/2015  . Physical deconditioning 02/15/2015  . Anticoagulation monitoring, INR range 2-3 02/15/2015  . General weakness 02/12/2015  . Absolute anemia   . Septic shock due to streptococcal infection   . Atelectasis   . Acute pulmonary edema   . Persistent atrial fibrillation   . Acute kidney injury   . Hyponatremia   . Hypokalemia   . Hyperglycemia   . Elevated d-dimer   . OSA (obstructive sleep apnea)   . Shoulder pain   . Gallstones   . Acute respiratory failure   . HSV-1 (herpes simplex virus 1) infection   . D-dimer, elevated   . Cholecystitis   . DIC (disseminated intravascular coagulation)   . Group A  streptococcal infection   . Cellulitis   . Abdominal pain   . Axillary pain   . Dyspnea   . Hypoxia   . Thrombocytopenia   . Transaminitis   . Hyperbilirubinemia   . FUO (fever of unknown origin)   . Acute renal insufficiency   . Dehydration 01/22/2015  . Atrial fibrillation with RVR 01/22/2015  . Breast pain, right 01/22/2015  . A-fib 01/22/2015  . Fever 01/22/2015  . Nausea & vomiting 01/22/2015  .  Dyspnea on exertion 11/30/2014  . Elevated MCV 05/25/2014  . Snoring 12/09/2013  . Otitis, externa, infective 04/23/2013  . Post-menopausal bleeding 02/10/2013  . Post-vaccination reaction 01/16/2013  . Increased endometrial stripe thickness 01/16/2013  . Weight gain 01/06/2013  . Symptomatic menopausal or female climacteric states 01/06/2013  . History of ovarian cyst 01/06/2013  . Mouth ulcer 06/09/2012  . LBP (low back pain) 05/09/2012  . Dermatitis of ear canal 05/09/2012  . Upper respiratory disease 10/22/2011  . Menopausal symptoms 06/22/2011  . Alopecia, unspecified 02/11/2011  . CONJUNCTIVITIS, ACUTE 10/18/2010  . RENAL CYST 11/11/2009  . TOBACCO USE, QUIT 10/12/2009  . HIP PAIN, LEFT 08/04/2009  . UNSPECIFIED MYALGIA AND MYOSITIS 04/12/2009  . VISION IMPAIRMENT, LOW VISION, ONE EYE-LEFT 06/23/2008  . BLEPHARITIS 06/23/2008  . Headache(784.0) 06/23/2008  . SWEATING 04/07/2008  . SYNCOPE 02/17/2008  . COUGH 11/14/2007  . PAP SMEAR, ABNORMAL 11/14/2007  . ALLERGIC RHINITIS 08/14/2007  . DEPRESSION 07/28/2007  . Atrial fibrillation 07/28/2007  . Palpitations 07/28/2007    Richie Bonanno, PT 03/01/2015, 1:53 PM  [facility] [address] [place], [place], [address] Phone: [phone]   Fax:  [phone]

## 2015-03-01 NOTE — Patient Instructions (Addendum)
Toe / Heel Raise (Standing)   Raise up on toes 20 times.  Rock back on heels to lift toes 20 times. Repeat 2 times/day. Copyright  VHI. All rights reserved.  Single Leg - Eyes Open   Holding support, lift right leg while maintaining balance over other leg. Progress to removing hands from support surface for longer periods of time. Hold_10_ seconds. Repeat ___3_ times per session on each side. Do __1-2__ sessions per day.  Copyright  VHI. All rights reserved.   Walking on Toes   Walk on toes for length of your hallway while continuing on a straight path.  Repeat 4 times the length of the hallway. Do __1-2__ sessions per day.  *BE NEAR THE WALL FOR SUPPORT  Copyright  VHI. All rights reserved.  Walking on Heels   Walk on heels for length of your hallway while continuing on a straight path.  Repeat 4 times the length of the hallway. Do _1-2__ sessions per day.  *BE NEAR WALL FOR SUPPORT. Copyright  VHI. All rights reserved.    Walking Program:  Begin walking for exercise for 6-8 minutes, 1-2 times/day, 5 days/week.   Progress your walking program by adding 1-2 minutes to your routine each week, as tolerated. Be sure to wear good walking shoes, walk in a safe environment (start indoors for now) and only progress to your tolerance.

## 2015-03-02 ENCOUNTER — Telehealth: Payer: Self-pay | Admitting: Internal Medicine

## 2015-03-02 NOTE — Telephone Encounter (Signed)
This was routed to me; this is a Dr. [name] patient

## 2015-03-02 NOTE — Telephone Encounter (Signed)
Pt states that since yesterday she has been having feelings of lightheadedness and dizziness when standing. Pt states that sometimes it is so bad that she feels like if she doesn't sit back down that she will faint. When standing pt feels like she has a pressure on the top of her head "like there is a book sitting on the top of her head". Pt states that BP today was 110/70, pulse 83. Pt states that she did not take her Zebeta yesterday or today. Pt denies SOB and CP and denies taking any new medications. Pt has appt to see Dr. [name] on 03/04/15. Informed pt that I would route this information to Dr. [name] for review and advisement. Pt verbalized understanding.

## 2015-03-02 NOTE — Telephone Encounter (Signed)
New Message   Patient is c/o  Pt c/o Syncope: STAT if syncope occurred within 30 minutes and pt complains of lightheadedness High Priority if episode of passing out, completely, today or in last 24 hours   1. Did you pass out today? No / has the feeling   2. When is the last time you passed out? She has not   3. Has this occurred multiple times? No   Did you have any symptoms prior to passing out? No  Patient feels very faint and light headed when she goes from sitting to standing. Please give patient a call back.

## 2015-03-03 ENCOUNTER — Encounter: Payer: Self-pay | Admitting: Gynecology

## 2015-03-03 ENCOUNTER — Ambulatory Visit (INDEPENDENT_AMBULATORY_CARE_PROVIDER_SITE_OTHER): Payer: Medicare Other | Admitting: Gynecology

## 2015-03-03 VITALS — BP 130/86

## 2015-03-03 DIAGNOSIS — N898 Other specified noninflammatory disorders of vagina: Secondary | ICD-10-CM

## 2015-03-03 LAB — WET PREP FOR TRICH, YEAST, CLUE
CLUE CELLS WET PREP: NONE SEEN
TRICH WET PREP: NONE SEEN

## 2015-03-03 NOTE — Telephone Encounter (Signed)
Will see tomorrow.   Continue to follow BP  Hold meds as needed if low and dizzy

## 2015-03-03 NOTE — Progress Notes (Signed)
   Patient is a 69 year old presented to the office today with a complaint of several days of having vulvar pruritus but no true discharge. Patient denies any dysuria, frequency, fever, or any back pain. Patient stated several weeks ago she been hospitalized was treated with antibiotic for sepsis.  Exam: Blood pressure 130/86 Gen. appearance well-developed we'll nourished female with a complaint of vulvar pruritus no discharge Pelvic: Bartholin urethra Skene was within normal limits Vagina: No lesions or discharge Cervix: No lesions or discharge Bimanual exam: Not done Rectal exam: Not done  Wet prep few yeast were noted  Assessment/plan: Monilial vulvovaginitis patient be treated with Monistat 3 daily at bedtime

## 2015-03-03 NOTE — Telephone Encounter (Signed)
Informed pt to continue to follow BP and hold meds as needed if low and dizzy. Informed pt to keep appt with Dr. [name] tomorrow. Pt verbalized understanding.

## 2015-03-03 NOTE — Patient Instructions (Addendum)

## 2015-03-04 ENCOUNTER — Encounter: Payer: Self-pay | Admitting: Internal Medicine

## 2015-03-04 ENCOUNTER — Ambulatory Visit (INDEPENDENT_AMBULATORY_CARE_PROVIDER_SITE_OTHER): Payer: Medicare Other | Admitting: Internal Medicine

## 2015-03-04 VITALS — BP 118/68 | HR 81 | Ht 66.0 in | Wt 204.8 lb

## 2015-03-04 DIAGNOSIS — I4891 Unspecified atrial fibrillation: Secondary | ICD-10-CM

## 2015-03-04 DIAGNOSIS — Z13220 Encounter for screening for lipoid disorders: Secondary | ICD-10-CM

## 2015-03-04 MED ORDER — DILTIAZEM HCL ER COATED BEADS 180 MG PO CP24: 180.0000 mg | ORAL_CAPSULE | Freq: Every day | ORAL | Status: DC

## 2015-03-04 MED ORDER — BISOPROLOL FUMARATE 10 MG PO TABS: 10.0000 mg | ORAL_TABLET | Freq: Every day | ORAL | Status: DC

## 2015-03-04 NOTE — Progress Notes (Signed)
Cardiology Office Note   Date:  03/04/2015   ID:  Amanda Wilkins, DOB October 11, 1946, MRN [id number]  PCP:  [name], MD  Cardiologist:   [name], MD   No chief complaint on file.     History of Present Illness: Amanda Wilkins is a 69 y.o. female with a history of CP, PAF and obesity  I saw her in December 2015  Echo prior was normal   When I saw her she complained of SOB    Set up for myoview  This showed mod sized defect at base and mid interolateral and inferior wall  PConsistent with ischemia  LVEF normal  I felt may not be trure positive as pt's SOB was chornic  Recomm cardiac CT but insurance declined.   She was admitted in April with N/V and weakness as well as diarrhea, fever, palpitations.  She was treated for sepsis  Was strep bacteremic.  GI complaints felt viral.  GI conslted  EGD normal Seh was in persistent afib in hosp  Rates improved  Not a NOAC candidate at time due to renal dysfunction.   D Dimer elevated   Felt would benefit from outpt sleep study    Current Outpatient Prescriptions  Medication Sig Dispense Refill  . bisoprolol (ZEBETA) 10 MG tablet Take 1 tablet (10 mg total) by mouth 2 (two) times daily. 60 tablet 3  . Cholecalciferol (VITAMIN D) 2000 UNITS tablet Take 2,000 Units by mouth daily.    [name] diltiazem (CARDIZEM CD) 180 MG 24 hr capsule Take 1 capsule (180 mg total) by mouth 2 (two) times daily. 60 capsule 3  . glycopyrrolate (ROBINUL) 1 MG tablet Take 1-2 mg by mouth 2 (two) times daily. Take 2 tablets (2 mg) every morning and 1 tablet (1 mg) every night    . sertraline (ZOLOFT) 50 MG tablet Take 1 tablet (50 mg total) by mouth daily. 20 tablet 0  . warfarin (COUMADIN) 5 MG tablet Take as directed by anticoagulation clinic 40 tablet 3   No current facility-administered medications for this visit.    Allergies:   Epinephrine; Epinephrine base; Aspirin; Penicillins; and Tape   Past Medical History  Diagnosis Date  . Allergy   .  Atrial fibrillation   . Migraines     on Zoloft for migraines  . Hypertension   . Chronic interstitial nephritis   . Depression   . Palpitations   . Obesity   . Arthritis     ON BOTH KNEES    Past Surgical History  Procedure Laterality Date  . Cesarean section    . Dental surgery      multiple implants  . Repair of torn meniscus on left 08/2014 Left 08/2015    Dr. Ronnie Derby at [facility]  . Esophagogastroduodenoscopy N/A 02/08/2015    Procedure: ESOPHAGOGASTRODUODENOSCOPY (EGD);  Surgeon: [name], MD;  Location: [place];  Service: Endoscopy;  Laterality: N/A;     Social History:  The patient  reports that she has never smoked. She has never used smokeless tobacco. She reports that she drinks about 8.4 oz of alcohol per week. She reports that she does not use illicit drugs.   Family History:  The patient's family history includes Cancer in her brother.    ROS:  Please see the history of present illness. All other systems are reviewed and  Negative to the above problem except as noted.    PHYSICAL EXAM: VS:  BP 118/68 mmHg  Pulse 81  Ht  5\' 6"  (1.676 m)  Wt 204 lb 12.8 oz (92.897 kg)  BMI 33.07 kg/m2  SpO2 97%  GEN: Well nourished, well developed, in no acute distress HEENT: normal Neck: no JVD, carotid bruits, or masses Cardiac: RRR; no murmurs, rubs, or gallops,no edema  Respiratory:  clear to auscultation bilaterally, normal work of breathing GI: soft, nontender, nondistended, + BS  No hepatomegaly  MS: no deformity Moving all extremities   Skin: warm and dry, no rash Neuro:  Strength and sensation are intact Psych: euthymic mood, full affect   EKG:  EKG is ordered today.   Lipid Panel    Component Value Date/Time   CHOL 178 11/11/2014 0943   TRIG 160.0* 11/11/2014 0943   HDL 68.70 11/11/2014 0943   CHOLHDL 3 11/11/2014 0943   VLDL 32.0 11/11/2014 0943   LDLCALC 77 11/11/2014 0943      Wt Readings from Last 3 Encounters:  03/04/15 204 lb 12.8 oz  (92.897 kg)  02/17/15 214 lb 4 oz (97.183 kg)  02/15/15 221 lb (100.245 kg)      ASSESSMENT AND PLAN: 1.  Atrial fib  The patient had persistent afib in hosp  Rapid at times  NOw back in SR.  Her CHADSVASc is 3 (at the least, posslbly 4)  I am reluctant to take off of coumadin  She does not want to be on extra meds I would recomm cutting back on dilt to 1x per day  She has already cut back on Bystolic to 1xper day  Follow   In 2 wks come in for 48 hour holter  2.  Dyspnea  No acute changes  I saw her in the fall Ordered cardiac CT which was not approved.  On review of records from [place] she had noncontrast CT Not imaging study for coronaries  BUt, it did not comment on signif calcifications of arteries  Minimal plaquing in aorta.  I am not convinced of signif CAD to explain  Would follow  Continue reconditioning     F/U base on results   Signed, [name], MD  03/04/2015 2:01 PM    [address], [place], [place]  [address] Phone: [phone]; Fax: [phone]

## 2015-03-04 NOTE — Patient Instructions (Addendum)
Your physician has recommended you make the following change in your medication:  Decrease your diltiazem 180 mg to daily. Continue taking bisoprolol 10 mg daily. Continue all other medications the same. Your physician recommends that you return for a FASTING lipid profile in 2 weeks on the same day as your 48 hour holter monitor placment. Your physician has recommended that you wear a 48 holter monitor. Holter monitors are medical devices that record the heart's electrical activity. Doctors most often use these monitors to diagnose arrhythmias. Arrhythmias are problems with the speed or rhythm of the heartbeat. The monitor is a small, portable device. You can wear one while you do your normal daily activities. This is usually used to diagnose what is causing palpitations/syncope (passing out). Your follow up appointment will be pending your test results.

## 2015-03-08 ENCOUNTER — Ambulatory Visit (INDEPENDENT_AMBULATORY_CARE_PROVIDER_SITE_OTHER): Payer: Medicare Other | Admitting: General Practice

## 2015-03-08 DIAGNOSIS — Z5181 Encounter for therapeutic drug level monitoring: Secondary | ICD-10-CM | POA: Diagnosis not present

## 2015-03-08 DIAGNOSIS — I4891 Unspecified atrial fibrillation: Secondary | ICD-10-CM | POA: Diagnosis not present

## 2015-03-08 LAB — POCT INR: INR: 2.4

## 2015-03-08 NOTE — Progress Notes (Signed)
Agree with plan 

## 2015-03-08 NOTE — Progress Notes (Signed)
Pre visit review using our clinic review tool, if applicable. No additional management support is needed unless otherwise documented below in the visit note. 

## 2015-03-09 ENCOUNTER — Ambulatory Visit: Payer: Medicare Other

## 2015-03-10 ENCOUNTER — Ambulatory Visit: Payer: Medicare Other | Admitting: Rehabilitative and Restorative Service Providers"

## 2015-03-10 DIAGNOSIS — R29898 Other symptoms and signs involving the musculoskeletal system: Secondary | ICD-10-CM | POA: Diagnosis not present

## 2015-03-10 DIAGNOSIS — R269 Unspecified abnormalities of gait and mobility: Secondary | ICD-10-CM

## 2015-03-10 NOTE — Patient Instructions (Signed)
Tip Card 1.The goal of habituation training is to assist in decreasing symptoms of vertigo, dizziness, or nausea provoked by specific head and body motions. 2.These exercises may initially increase symptoms; however, be persistent and work through symptoms. With repetition and time, the exercises will assist in reducing or eliminating symptoms. 3.Exercises should be stopped and discussed with the therapist if you experience any of the following: - Sudden change or fluctuation in hearing - New onset of ringing in the ears, or increase in current intensity - Any fluid discharge from the ear - Severe pain in neck or back - Extreme nausea  Copyright  VHI. All rights reserved.  Rolling   With pillow under head, start on back. Roll to your right side.  Hold until dizziness stops, plus 20 seconds and then roll to the left side.  Hold until dizziness stops, plus 20 seconds.  Repeat sequence 5 times per session. Do 2 sessions per day.  Copyright  VHI. All rights reserved.  Sit to Side-Lying   Sit on edge of bed. Lie down onto the right side and hold until dizziness stops, plus 20 seconds.  Return to sitting and wait until dizziness stops, plus 20 seconds.  Repeat to the left side. Repeat sequence 5 times per session. Do 2 sessions per day.  Copyright  VHI. All rights reserved.

## 2015-03-10 NOTE — Therapy (Signed)
[facility] [address] [place], [place], [address] Phone: [phone]   Fax:  [phone]  Physical Therapy Treatment  Patient Details  Name: Amanda Wilkins MRN: [id number] Date of Birth: Jun 01, 1946 Referring Provider:  Tresa Garter, MD  Encounter Date: 03/10/2015      PT End of Session - 03/10/15 1500    Visit Number 3   Number of Visits 6   Date for PT Re-Evaluation 04/08/15   Authorization Type G code every 10th visit   PT Start Time 1015   PT Stop Time 1104   PT Time Calculation (min) 49 min   Activity Tolerance Patient tolerated treatment well   Behavior During Therapy [facility] for tasks assessed/performed      Past Medical History  Diagnosis Date  . Allergy   . Atrial fibrillation   . Migraines     on Zoloft for migraines  . Hypertension   . Chronic interstitial nephritis   . Depression   . Palpitations   . Obesity   . Arthritis     ON BOTH KNEES    Past Surgical History  Procedure Laterality Date  . Cesarean section    . Dental surgery      multiple implants  . Repair of torn meniscus on left 08/2014 Left 08/2015    Dr. Sherlean Foot at [facility]  . Esophagogastroduodenoscopy N/A 02/08/2015    Procedure: ESOPHAGOGASTRODUODENOSCOPY (EGD);  Surgeon: Louis Meckel, MD;  [facility];  Service: Endoscopy;  Laterality: N/A;    There were no vitals filed for this visit.  Visit Diagnosis:  Muscular deconditioning  Abnormality of gait      Subjective Assessment - 03/10/15 1027    Subjective The patient was sore after last session in right hip.  She hit her toe on Sunday morning (R foot) and has had pain due to injury (pulled a door open on foot).  The patient has been limited in HEP due to R foot pain and R hip pain.   Currently in Pain? No/denies  can get toe pain with toe movement      THERAPEUTIC EXERCISE: The patient performed supine exercises with hooklying hip  abduction Hamstring stretching seated Hip adductor stretching seated Trunk rotation supine lumbar rocking STanding hip adductor stretch with modified down dog position for hamstring stretch standing  (*patient c/o mild sense of dizziness)  Gait: Ambulation outdoor surfaces (including curbs, rubber mulch, pinestraw natural area) with distant supervision x 400 ft without loss of balance   NEUROMUSCULAR RE-EDUCATION: Assessed patient for vestibular issues and provided HEP for habituation      Vestibular Assessment - 03/10/15 1041    Symptom Behavior   Type of Dizziness Spinning   Frequency of Dizziness intermittent, daily   Duration of Dizziness seconds   Aggravating Factors Rolling to left   Relieving Factors Lying supine   Positional Testing   Sidelying Test Sidelying Right;Sidelying Left   Horizontal Canal Testing Horizontal Canal Right;Horizontal Canal Left   Horizontal Canal Right   Horizontal Canal Right Duration --  15 seconds   Horizontal Canal Right Symptoms Geotrophic   Horizontal Canal Left   Horizontal Canal Left Duration --  30 seconds   Horizontal Canal Left Symptoms Geotrophic           Vestibular Treatment/Exercise - 03/10/15 0001    Vestibular Treatment/Exercise   Vestibular Treatment Provided Canalith Repositioning;Habituation   Canalith Repositioning Canal Roll Left   Habituation Exercises Horizontal Roll   Canal  Roll Left   Number of Reps  --  1   Overall Response  Improved Symptoms   Response Details  --  mile symptoms with retest lasting <5 sec   Horizontal Roll   Number of Reps  --  3   Symptom Description  --  improving symptoms with repetition           PT Education - 03/10/15 1056    Education provided Yes   Education Details HEP: habituation for BPPV rolling R<>L and provided sidelying for future mgmt of recurrent BPPV   Person(s) Educated Patient   Methods Explanation;Demonstration;Handout   Comprehension Verbalized  understanding;Returned demonstration          PT Short Term Goals - 03/01/15 1349    PT SHORT TERM GOAL #1   Title The patient will return demo HEP for ankle strength, general endurance and home walking.   Baseline Target date 03/25/2015   Time 4   Period Weeks   PT SHORT TERM GOAL #2   Title The patient will improve gait speed from 2.7 ft/sec up to 3.2 ft/sec to demo improving community mobility.   Baseline Target date 03/25/2015   Time 4   Period Weeks   PT SHORT TERM GOAL #3   Title The patient will ambulate for 5 minutes nonstop for improved endurance (2.5 min at baseline).   Baseline Met on 03/01/2015 with patinet ambulating 7.5 minutes nonstop.   Time 4   Period Weeks   Status Achieved           PT Long Term Goals - 02/22/15 1407    PT LONG TERM GOAL #1   Title The patient will improve > or equal to 20% on ABC scale from 52.5% baseline.   Baseline Target date 03/25/2015   Time 6   Period Weeks   PT LONG TERM GOAL #2   Title The patient will maintain single limb stance x 5 seconds on R and L LEs for improved balance control.   Baseline Target date 03/25/2015   Time 6   Period Weeks   PT LONG TERM GOAL #3   Title The patient will ambulate >550 ft in 3 minutes (from 442 ft at eval) to demo improving endurance/mobility.    Baseline Target date 03/25/2015   Time 6   Period Weeks   PT LONG TERM GOAL #4   Title The patient will ambulate for 10 minutes nonstop to demo improved endurance for limited community mobility.   Baseline TArget date 03/25/2015   Time 6   Period Weeks               Plan - 03/10/15 1501    Clinical Impression Statement The patient presented today with R toe pain from injury on Sunday.  PT focused on LE stretching due to c/o R hip pain/tightness and patient c/o vertigo with activities.  She was found to have L horizontal canal BPPV and responded to treatment today.  Per her report, she has episodic vertigo that clears on its own over several  weeks x 3-4 times in the past.   Pt will benefit from skilled therapeutic intervention in order to improve on the following deficits Abnormal gait;Decreased activity tolerance;Decreased balance;Decreased mobility;Decreased strength;Difficulty walking;Postural dysfunction;Decreased endurance  vestibular deficits discovered on 5/26   Rehab Potential Good   PT Treatment/Interventions Therapeutic activities;Patient/family education;Therapeutic exercise;Gait training;Balance training;Neuromuscular re-education;Functional mobility training  vestibular rehab   PT Next Visit Plan Check HEP, progress balance and gait activities  to tolerance.  Check BPPV + review habituation exercises.  Add hip adductor stretch.   Consulted and Agree with Plan of Care Patient        Problem List Patient Active Problem List   Diagnosis Date Noted  . Encounter for therapeutic drug monitoring 02/22/2015  . Essential hypertension 02/17/2015  . Physical deconditioning 02/15/2015  . Anticoagulation monitoring, INR range 2-3 02/15/2015  . General weakness 02/12/2015  . Absolute anemia   . Septic shock due to streptococcal infection   . Atelectasis   . Acute pulmonary edema   . Persistent atrial fibrillation   . Acute kidney injury   . Hyponatremia   . Hypokalemia   . Hyperglycemia   . Elevated d-dimer   . OSA (obstructive sleep apnea)   . Shoulder pain   . Gallstones   . Acute respiratory failure   . HSV-1 (herpes simplex virus 1) infection   . D-dimer, elevated   . Cholecystitis   . DIC (disseminated intravascular coagulation)   . Group A streptococcal infection   . Cellulitis   . Abdominal pain   . Axillary pain   . Dyspnea   . Hypoxia   . Thrombocytopenia   . Transaminitis   . Hyperbilirubinemia   . FUO (fever of unknown origin)   . Acute renal insufficiency   . Dehydration 01/22/2015  . Atrial fibrillation with RVR 01/22/2015  . Breast pain, right 01/22/2015  . A-fib 01/22/2015  . Fever  01/22/2015  . Nausea & vomiting 01/22/2015  . Dyspnea on exertion 11/30/2014  . Elevated MCV 05/25/2014  . Snoring 12/09/2013  . Otitis, externa, infective 04/23/2013  . Post-menopausal bleeding 02/10/2013  . Post-vaccination reaction 01/16/2013  . Increased endometrial stripe thickness 01/16/2013  . Weight gain 01/06/2013  . Symptomatic menopausal or female climacteric states 01/06/2013  . History of ovarian cyst 01/06/2013  . Mouth ulcer 06/09/2012  . LBP (low back pain) 05/09/2012  . Dermatitis of ear canal 05/09/2012  . Upper respiratory disease 10/22/2011  . Menopausal symptoms 06/22/2011  . Alopecia, unspecified 02/11/2011  . CONJUNCTIVITIS, ACUTE 10/18/2010  . RENAL CYST 11/11/2009  . TOBACCO USE, QUIT 10/12/2009  . HIP PAIN, LEFT 08/04/2009  . UNSPECIFIED MYALGIA AND MYOSITIS 04/12/2009  . VISION IMPAIRMENT, LOW VISION, ONE EYE-LEFT 06/23/2008  . BLEPHARITIS 06/23/2008  . Headache(784.0) 06/23/2008  . SWEATING 04/07/2008  . SYNCOPE 02/17/2008  . COUGH 11/14/2007  . PAP SMEAR, ABNORMAL 11/14/2007  . ALLERGIC RHINITIS 08/14/2007  . DEPRESSION 07/28/2007  . Atrial fibrillation 07/28/2007  . Palpitations 07/28/2007    Herby Amick, PT 03/10/2015, 3:07 PM  [facility] [address] [place], [place], [address] Phone: [phone]   Fax:  [phone]

## 2015-03-11 ENCOUNTER — Encounter: Payer: Medicare Other | Admitting: Internal Medicine

## 2015-03-15 ENCOUNTER — Encounter: Payer: Self-pay | Admitting: Internal Medicine

## 2015-03-15 ENCOUNTER — Other Ambulatory Visit (INDEPENDENT_AMBULATORY_CARE_PROVIDER_SITE_OTHER): Payer: Medicare Other

## 2015-03-15 ENCOUNTER — Ambulatory Visit (INDEPENDENT_AMBULATORY_CARE_PROVIDER_SITE_OTHER): Payer: Medicare Other | Admitting: Internal Medicine

## 2015-03-15 VITALS — BP 120/66 | HR 80 | Wt 212.0 lb

## 2015-03-15 DIAGNOSIS — D696 Thrombocytopenia, unspecified: Secondary | ICD-10-CM

## 2015-03-15 DIAGNOSIS — R5381 Other malaise: Secondary | ICD-10-CM | POA: Diagnosis not present

## 2015-03-15 DIAGNOSIS — D649 Anemia, unspecified: Secondary | ICD-10-CM | POA: Insufficient documentation

## 2015-03-15 DIAGNOSIS — N179 Acute kidney failure, unspecified: Secondary | ICD-10-CM

## 2015-03-15 DIAGNOSIS — I10 Essential (primary) hypertension: Secondary | ICD-10-CM | POA: Diagnosis not present

## 2015-03-15 DIAGNOSIS — F4323 Adjustment disorder with mixed anxiety and depressed mood: Secondary | ICD-10-CM

## 2015-03-15 LAB — TSH: TSH: 2.7 u[IU]/mL (ref 0.35–4.50)

## 2015-03-15 LAB — URINALYSIS, ROUTINE W REFLEX MICROSCOPIC
Bilirubin Urine: NEGATIVE
HGB URINE DIPSTICK: NEGATIVE
Ketones, ur: NEGATIVE
Nitrite: NEGATIVE
Specific Gravity, Urine: <=1.005 — AB (ref 1.000–1.030)
TOTAL PROTEIN, URINE-UPE24: NEGATIVE
URINE GLUCOSE: NEGATIVE
Urobilinogen, UA: 0.2 (ref 0.0–1.0)
pH: 6 (ref 5.0–8.0)

## 2015-03-15 LAB — CBC WITH DIFFERENTIAL/PLATELET
BASOS ABS: 0 10*3/uL (ref 0.0–0.1)
Basophils Relative: 0.4 % (ref 0.0–3.0)
EOS ABS: 0.2 10*3/uL (ref 0.0–0.7)
EOS PCT: 2.6 % (ref 0.0–5.0)
HEMATOCRIT: 35.7 % — AB (ref 36.0–46.0)
HEMOGLOBIN: 12.1 g/dL (ref 12.0–15.0)
Lymphocytes Relative: 38.8 % (ref 12.0–46.0)
Lymphs Abs: 3.2 10*3/uL (ref 0.7–4.0)
MCHC: 33.9 g/dL (ref 30.0–36.0)
MCV: 97.3 fl (ref 78.0–100.0)
Monocytes Absolute: 0.5 10*3/uL (ref 0.1–1.0)
Monocytes Relative: 5.5 % (ref 3.0–12.0)
Neutro Abs: 4.4 10*3/uL (ref 1.4–7.7)
Neutrophils Relative %: 52.7 % (ref 43.0–77.0)
PLATELETS: 240 10*3/uL (ref 150.0–400.0)
RBC: 3.66 Mil/uL — ABNORMAL LOW (ref 3.87–5.11)
RDW: 13.9 % (ref 11.5–15.5)
WBC: 8.4 10*3/uL (ref 4.0–10.5)

## 2015-03-15 LAB — HEPATIC FUNCTION PANEL
ALT: 25 U/L (ref 0–35)
AST: 22 U/L (ref 0–37)
Albumin: 4.3 g/dL (ref 3.5–5.2)
Alkaline Phosphatase: 66 U/L (ref 39–117)
BILIRUBIN DIRECT: 0.1 mg/dL (ref 0.0–0.3)
BILIRUBIN TOTAL: 0.4 mg/dL (ref 0.2–1.2)
Total Protein: 8 g/dL (ref 6.0–8.3)

## 2015-03-15 LAB — BASIC METABOLIC PANEL
BUN: 22 mg/dL (ref 6–23)
CHLORIDE: 101 meq/L (ref 96–112)
CO2: 26 mEq/L (ref 19–32)
Calcium: 9.7 mg/dL (ref 8.4–10.5)
Creatinine, Ser: 1.25 mg/dL — ABNORMAL HIGH (ref 0.40–1.20)
GFR: 45.26 mL/min — ABNORMAL LOW (ref >60.00)
Glucose, Bld: 98 mg/dL (ref 70–99)
Potassium: 4.4 mEq/L (ref 3.5–5.1)
SODIUM: 135 meq/L (ref 135–145)

## 2015-03-15 LAB — IBC PANEL
Iron: 100 ug/dL (ref 42–145)
SATURATION RATIOS: 29.4 % (ref 20.0–50.0)
Transferrin: 243 mg/dL (ref 212.0–360.0)

## 2015-03-15 MED ORDER — ALPRAZOLAM 0.25 MG PO TABS: 0.2500 mg | ORAL_TABLET | Freq: Two times a day (BID) | ORAL | Status: DC | PRN

## 2015-03-15 NOTE — Assessment & Plan Note (Signed)
Worse On Zoloft - will increase dose to 1.5 tab/d Xanax low dose prn Psychology ref

## 2015-03-15 NOTE — Assessment & Plan Note (Signed)
Labs Bisoprolol, Cardizem

## 2015-03-15 NOTE — Assessment & Plan Note (Signed)
CBC

## 2015-03-15 NOTE — Assessment & Plan Note (Addendum)
S/p critical illness: 4/16 Strep sepsis Improving

## 2015-03-15 NOTE — Assessment & Plan Note (Signed)
Bisoprolol, Cardizem

## 2015-03-15 NOTE — Progress Notes (Signed)
   Subjective:    HPI     The patient needs to address  chronic palpitations, alopecia, ARF, A. Fib C/o anxiety, stress  BP Readings from Last 3 Encounters:  03/15/15 120/66  03/04/15 118/68  03/03/15 130/86   Wt Readings from Last 3 Encounters:  03/15/15 212 lb (96.163 kg)  03/04/15 204 lb 12.8 oz (92.897 kg)  02/17/15 214 lb 4 oz (97.183 kg)       Review of Systems  Constitutional: Negative for chills, activity change, appetite change, fatigue and unexpected weight change.  HENT: Negative for congestion, mouth sores and sinus pressure.   Eyes: Negative for visual disturbance.  Respiratory: Negative for cough and chest tightness.   Cardiovascular: Positive for palpitations. Negative for leg swelling.  Gastrointestinal: Negative for nausea and abdominal pain.  Genitourinary: Negative for dysuria, urgency, frequency, hematuria, difficulty urinating and vaginal pain.  Musculoskeletal: Negative for myalgias, back pain and gait problem.  Skin: Negative for pallor and rash.       Hair loss  Allergic/Immunologic: Positive for environmental allergies.  Neurological: Negative for dizziness, tremors, weakness, numbness and headaches.  Psychiatric/Behavioral: Negative for suicidal ideas, confusion and sleep disturbance.       Objective:   Physical Exam  Constitutional: She appears well-developed. No distress.  Obese   HENT:  Head: Normocephalic.  Right Ear: External ear normal.  Left Ear: External ear normal.  Nose: Nose normal.  Mouth/Throat: Oropharynx is clear and moist.  Eyes: Conjunctivae are normal. Pupils are equal, round, and reactive to light. Right eye exhibits no discharge. Left eye exhibits no discharge.  Neck: Normal range of motion. Neck supple. No JVD present. No tracheal deviation present. No thyromegaly present.  Cardiovascular: Normal rate, regular rhythm and normal heart sounds.   No murmur heard. Pulmonary/Chest: No stridor. No respiratory distress.  She has no wheezes. She has no rales.  Abdominal: Soft. Bowel sounds are normal. She exhibits no distension and no mass. There is no tenderness. There is no rebound and no guarding.  Musculoskeletal: She exhibits no edema or tenderness.  Lymphadenopathy:    She has no cervical adenopathy.  Neurological: She displays normal reflexes. No cranial nerve deficit. She exhibits normal muscle tone. Coordination normal.  Skin: No rash noted. No erythema.  alopecia  Psychiatric: She has a normal mood and affect. Her behavior is normal. Judgment and thought content normal.   Lab Results  Component Value Date   WBC 8.3 02/17/2015   HGB 11.6* 02/17/2015   HCT 32.9* 02/17/2015   PLT 315.0 02/17/2015   GLUCOSE 104* 02/17/2015   CHOL 178 11/11/2014   TRIG 160.0* 11/11/2014   HDL 68.70 11/11/2014   LDLCALC 77 11/11/2014   ALT 23 02/02/2015   AST 23 02/02/2015   NA 136 02/17/2015   K 4.1 02/17/2015   CL 103 02/17/2015   CREATININE 1.22* 02/17/2015   BUN 13 02/17/2015   CO2 25 02/17/2015   TSH 2.917 01/23/2015   INR 2.4 03/08/2015   HGBA1C 5.8* 01/30/2015           Assessment & Plan:

## 2015-03-15 NOTE — Progress Notes (Signed)
Pre visit review using our clinic review tool, if applicable. No additional management support is needed unless otherwise documented below in the visit note. 

## 2015-03-15 NOTE — Assessment & Plan Note (Signed)
CBC, iron 

## 2015-03-16 ENCOUNTER — Ambulatory Visit: Payer: Medicare Other | Attending: Internal Medicine | Admitting: Rehabilitative and Restorative Service Providers"

## 2015-03-16 DIAGNOSIS — R269 Unspecified abnormalities of gait and mobility: Secondary | ICD-10-CM | POA: Diagnosis not present

## 2015-03-16 NOTE — Therapy (Signed)
[facility] [address] [place], [place], [address] Phone: [phone]   Fax:  [phone]  Physical Therapy Treatment  Patient Details  Name: Amanda Wilkins MRN: [id number] Date of Birth: [birth year] Referring Provider:  Tresa Garter, MD  Encounter Date: 03/16/2015      PT End of Session - 03/16/15 1058    Visit Number 4   Number of Visits 6   Date for PT Re-Evaluation 04/08/15   Authorization Type G code every 10th visit   PT Start Time 1018   PT Stop Time 1058   PT Time Calculation (min) 40 min   Activity Tolerance Patient tolerated treatment well   Behavior During Therapy [facility] for tasks assessed/performed      Past Medical History  Diagnosis Date  . Allergy   . Atrial fibrillation   . Migraines     on Zoloft for migraines  . Hypertension   . Chronic interstitial nephritis   . Depression   . Palpitations   . Obesity   . Arthritis     ON BOTH KNEES    Past Surgical History  Procedure Laterality Date  . Cesarean section    . Dental surgery      multiple implants  . Repair of torn meniscus on left 08/2014 Left 08/2015    Dr. Sherlean Foot at [facility]  . Esophagogastroduodenoscopy N/A 02/08/2015    Procedure: ESOPHAGOGASTRODUODENOSCOPY (EGD);  Surgeon: Louis Meckel, MD;  Location: [facility] ENDOSCOPY;  Service: Endoscopy;  Laterality: N/A;    There were no vitals filed for this visit.  Visit Diagnosis:  Abnormality of gait      Subjective Assessment - 03/16/15 1021    Subjective The patient reports that she returned to driving yesterday when her husband is with her.  She has returned to exercising with personal trainer and walking independently in the communtiy.  "My ear has been perfect since I left here last time."   Patient Stated Goals Return to prior exercise routine (with personal trainer), improve strength, endurance, and balance.   Currently in Pain? Yes   Pain Location Knee   Pain  Orientation Left   Pain Descriptors / Indicators Sore   Pain Onset More than a month ago   Pain Frequency Intermittent   Aggravating Factors  walking can at times   Pain Relieving Factors uncertain      Gait: The patient ambulates 10 minutes nonstop on level and unlevel surfaces independently She ambulates 750 ft in 3 minutes nonstop without rest independently on level surfaces Gait speed=3.71 ft/sec without a device Discussed home walking program and progressing distance and walking on her street for community gait activities  NEUROMUSCULAR RE-EDUCATION: Single limb stance activities  SELF CARE/HOME MANAGEMENT: Discussed upcoming d/c from PT and patient wishes to f/u one more visit  Discussed re-integration into community mobility activities (shopping, driving with her husband, walking outdoors)                     PT Education - 03/16/15 1058    Education provided Yes   Education Details progression of home walking program   Person(s) Educated Patient   Methods Explanation;Demonstration   Comprehension Verbalized understanding          PT Short Term Goals - 03/16/15 1024    PT SHORT TERM GOAL #1   Title The patient will return demo HEP for ankle strength, general endurance and home walking.   Baseline Continue working towards, limited  by toe pain after injury last week.   Time 4   Period Weeks   Status On-going   PT SHORT TERM GOAL #2   Title The patient will improve gait speed from 2.7 ft/sec up to 3.2 ft/sec to demo improving community mobility.   Baseline Met on 03/16/2015 improving from 2.7 ft/sec up to 3.71 ft/sec   Time 4   Period Weeks   Status Achieved   PT SHORT TERM GOAL #3   Title The patient will ambulate for 5 minutes nonstop for improved endurance (2.5 min at baseline).   Baseline Met on 03/01/2015 with patinet ambulating 7.5 minutes nonstop.   Time 4   Period Weeks   Status Achieved           PT Long Term Goals - 03/16/15 1025     PT LONG TERM GOAL #1   Title The patient will improve > or equal to 20% on ABC scale from 52.5% baseline.   Baseline Target date 03/25/2015   Time 6   Period Weeks   PT LONG TERM GOAL #2   Title The patient will maintain single limb stance x 5 seconds on R and L LEs for improved balance control.   Baseline Pt met on 03/16/2015   Time 6   Period Weeks   Status Achieved   PT LONG TERM GOAL #3   Title The patient will ambulate >550 ft in 3 minutes (from 442 ft at eval) to demo improving endurance/mobility.    Baseline Met on 03/16/2015 improving from 442 ft up to 750 ft.   Time 6   Period Weeks   Status Achieved   PT LONG TERM GOAL #4   Title The patient will ambulate for 10 minutes nonstop to demo improved endurance for limited community mobility.   Baseline TArget date 03/25/2015.  Patient met on 03/16/2015   Time 6   Period Weeks   Status Achieved               Plan - 03/16/15 1059    Clinical Impression Statement The patient met all STGs and LTGs except for HEP (due to recent toe injury), and checking activiites of balance confidence scale.  She is progressing well.  Patient will f/u one more visit to ensure she is able to progress HEP well for post d/c.   PT Next Visit Plan Check HEP, hip adductor stretch to add, FOTO/ABC scale   Consulted and Agree with Plan of Care Patient        Problem List Patient Active Problem List   Diagnosis Date Noted  . Anemia 03/15/2015  . Encounter for therapeutic drug monitoring 02/22/2015  . Essential hypertension 02/17/2015  . Physical deconditioning 02/15/2015  . Anticoagulation monitoring, INR range 2-3 02/15/2015  . General weakness 02/12/2015  . Septic shock due to streptococcal infection   . Atelectasis   . Acute pulmonary edema   . Persistent atrial fibrillation   . Acute kidney injury   . Hyponatremia   . Hypokalemia   . Hyperglycemia   . Elevated d-dimer   . OSA (obstructive sleep apnea)   . Shoulder pain   .  Gallstones   . Acute respiratory failure   . HSV-1 (herpes simplex virus 1) infection   . D-dimer, elevated   . Cholecystitis   . DIC (disseminated intravascular coagulation)   . Group A streptococcal infection   . Cellulitis   . Abdominal pain   . Axillary pain   . Dyspnea   .  Hypoxia   . Thrombocytopenia   . Transaminitis   . Hyperbilirubinemia   . FUO (fever of unknown origin)   . Acute renal insufficiency   . Dehydration 01/22/2015  . Atrial fibrillation with RVR 01/22/2015  . Breast pain, right 01/22/2015  . A-fib 01/22/2015  . Fever 01/22/2015  . Nausea & vomiting 01/22/2015  . Dyspnea on exertion 11/30/2014  . Elevated MCV 05/25/2014  . Snoring 12/09/2013  . Otitis, externa, infective 04/23/2013  . Post-menopausal bleeding 02/10/2013  . Post-vaccination reaction 01/16/2013  . Increased endometrial stripe thickness 01/16/2013  . Weight gain 01/06/2013  . Symptomatic menopausal or female climacteric states 01/06/2013  . History of ovarian cyst 01/06/2013  . Mouth ulcer 06/09/2012  . LBP (low back pain) 05/09/2012  . Dermatitis of ear canal 05/09/2012  . Upper respiratory disease 10/22/2011  . Menopausal symptoms 06/22/2011  . Alopecia, unspecified 02/11/2011  . CONJUNCTIVITIS, ACUTE 10/18/2010  . RENAL CYST 11/11/2009  . TOBACCO USE, QUIT 10/12/2009  . HIP PAIN, LEFT 08/04/2009  . UNSPECIFIED MYALGIA AND MYOSITIS 04/12/2009  . VISION IMPAIRMENT, LOW VISION, ONE EYE-LEFT 06/23/2008  . BLEPHARITIS 06/23/2008  . Headache(784.0) 06/23/2008  . SWEATING 04/07/2008  . SYNCOPE 02/17/2008  . COUGH 11/14/2007  . PAP SMEAR, ABNORMAL 11/14/2007  . ALLERGIC RHINITIS 08/14/2007  . Adjustment disorder with mixed anxiety and depressed mood 07/28/2007  . Atrial fibrillation 07/28/2007  . Palpitations 07/28/2007    Millie Forde, PT 03/16/2015, 11:01 AM  [facility] [address] [place], [place],  [address] Phone: [phone]   Fax:  [phone]

## 2015-03-17 ENCOUNTER — Encounter: Payer: Medicare Other | Admitting: Internal Medicine

## 2015-03-18 ENCOUNTER — Other Ambulatory Visit: Payer: Medicare Other

## 2015-03-21 ENCOUNTER — Ambulatory Visit: Payer: Medicare Other | Admitting: Rehabilitative and Restorative Service Providers"

## 2015-03-22 ENCOUNTER — Ambulatory Visit: Payer: Medicare Other

## 2015-03-22 ENCOUNTER — Ambulatory Visit (INDEPENDENT_AMBULATORY_CARE_PROVIDER_SITE_OTHER): Payer: Medicare Other | Admitting: *Deleted

## 2015-03-22 DIAGNOSIS — I4891 Unspecified atrial fibrillation: Secondary | ICD-10-CM | POA: Diagnosis not present

## 2015-03-22 DIAGNOSIS — Z5181 Encounter for therapeutic drug level monitoring: Secondary | ICD-10-CM | POA: Diagnosis not present

## 2015-03-22 LAB — POCT INR: INR: 2.7

## 2015-03-22 NOTE — Progress Notes (Signed)
I have reviewed and agree with the plan. 

## 2015-03-22 NOTE — Progress Notes (Signed)
Pre visit review using our clinic review tool, if applicable. No additional management support is needed unless otherwise documented below in the visit note. 

## 2015-03-23 ENCOUNTER — Ambulatory Visit (INDEPENDENT_AMBULATORY_CARE_PROVIDER_SITE_OTHER): Payer: Medicare Other

## 2015-03-23 ENCOUNTER — Other Ambulatory Visit (INDEPENDENT_AMBULATORY_CARE_PROVIDER_SITE_OTHER): Payer: Medicare Other | Admitting: *Deleted

## 2015-03-23 DIAGNOSIS — I4891 Unspecified atrial fibrillation: Secondary | ICD-10-CM | POA: Diagnosis not present

## 2015-03-23 DIAGNOSIS — Z13220 Encounter for screening for lipoid disorders: Secondary | ICD-10-CM

## 2015-03-23 LAB — LIPID PANEL
CHOLESTEROL: 224 mg/dL — AB (ref 0–200)
HDL: 61.3 mg/dL (ref >39.00)
LDL Cholesterol: 142 mg/dL — ABNORMAL HIGH (ref 0–99)
NonHDL: 162.7
TRIGLYCERIDES: 106 mg/dL (ref 0.0–149.0)
Total CHOL/HDL Ratio: 4
VLDL: 21.2 mg/dL (ref 0.0–40.0)

## 2015-03-29 ENCOUNTER — Telehealth: Payer: Self-pay | Admitting: *Deleted

## 2015-03-29 ENCOUNTER — Ambulatory Visit: Payer: Medicare Other | Admitting: Rehabilitative and Restorative Service Providers"

## 2015-03-29 NOTE — Telephone Encounter (Signed)
-----   Message from [name] V, MD sent at 03/24/2015  9:57 AM EDT ----- Lipids were much better 4 months ago LDL is 142  Was 77. ? What is different.   If wants to work on diet can  Goal:  With plaquing in aorta noted (mild) LDL should be less than 100

## 2015-03-30 NOTE — Telephone Encounter (Signed)
Conclusion    Sinus rhythm Rates 65 to 113 bpm Average HR 65. Rare PVC. Short burst of PAT (6 beats ) No afib.  Recomm: She could stop diltiazem Continue Zebeta for now.  Wilth recent afib throughout admission I am reluctant to take off of coumadin yet Risk of recurrence high. Follow Make sure has BP and HR check on day I am in clinic in 1 month.      Appointment made for nurse visit/bp check on 04/21/15.  Pt will bring BP cuff with her.  She reports recent bps: 97-102 / 23s, HR 90s;  in AM and 115-125 / 60s  HR 80s in PM.  Advised these are acceptable readings.  She reports dizziness with position changes which is not new but seems to be worse lately. She is staying hydrated.  States was changed from atenolol to bisoprolol in hospital and wants to know if she could change back.  Advised her I will forward to Dr. [name] to review and will call her with any new recommendations. She is appreciative for the call.

## 2015-03-30 NOTE — Telephone Encounter (Signed)
She will stop diltiazem.

## 2015-04-05 ENCOUNTER — Ambulatory Visit (INDEPENDENT_AMBULATORY_CARE_PROVIDER_SITE_OTHER): Payer: Medicare Other | Admitting: Psychology

## 2015-04-05 DIAGNOSIS — F411 Generalized anxiety disorder: Secondary | ICD-10-CM | POA: Diagnosis not present

## 2015-04-05 DIAGNOSIS — F332 Major depressive disorder, recurrent severe without psychotic features: Secondary | ICD-10-CM | POA: Diagnosis not present

## 2015-04-06 ENCOUNTER — Encounter: Payer: Medicare Other | Admitting: Rehabilitative and Restorative Service Providers"

## 2015-04-06 NOTE — Therapy (Signed)
[facility] [address] [place], [place], [address] Phone: [phone]   Fax:  [phone]  Patient Details  Name: Amanda Wilkins MRN: [id number] Date of Birth: 12/29/1945 Referring Provider:  No ref. provider found  Encounter Date: 04/06/2015  PHYSICAL THERAPY DISCHARGE SUMMARY  Visits from Start of Care: 4  Current functional level related to goals / functional outcomes:     PT Long Term Goals - 03/16/15 1025    PT LONG TERM GOAL #1   Title The patient will improve > or equal to 20% on ABC scale from 52.5% baseline.   Baseline Target date 03/25/2015   Time 6   Period Weeks   PT LONG TERM GOAL #2   Title The patient will maintain single limb stance x 5 seconds on R and L LEs for improved balance control.   Baseline Pt met on 03/16/2015   Time 6   Period Weeks   Status Achieved   PT LONG TERM GOAL #3   Title The patient will ambulate >550 ft in 3 minutes (from 442 ft at eval) to demo improving endurance/mobility.    Baseline Met on 03/16/2015 improving from 442 ft up to 750 ft.   Time 6   Period Weeks   Status Achieved   PT LONG TERM GOAL #4   Title The patient will ambulate for 10 minutes nonstop to demo improved endurance for limited community mobility.   Baseline TArget date 03/25/2015.  Patient met on 03/16/2015   Time 6   Period Weeks   Status Achieved        Remaining deficits: Patient has returned to community mobility.  Patient fatigues with extended ambulation.   Education / Equipment: HEP, progression of home walking.  Plan: Patient agrees to discharge.  Patient goals were met. Patient is being discharged due to meeting the stated rehab goals. Goal for Activities of Balance Confidence scale not checked due to patient did not complete ABC measure-d/c'd via phone for last session. Thank you for the referral of this patient.             [place], [place] 04/06/2015, 12:13 PM  [facility] [address], [place], [address] Phone: [phone]   Fax:  [phone]

## 2015-04-08 ENCOUNTER — Encounter: Payer: Self-pay | Admitting: Women's Health

## 2015-04-08 ENCOUNTER — Ambulatory Visit (INDEPENDENT_AMBULATORY_CARE_PROVIDER_SITE_OTHER): Payer: Medicare Other | Admitting: Women's Health

## 2015-04-08 VITALS — BP 130/84 | Ht 66.0 in | Wt 215.0 lb

## 2015-04-08 DIAGNOSIS — R21 Rash and other nonspecific skin eruption: Secondary | ICD-10-CM | POA: Diagnosis not present

## 2015-04-08 DIAGNOSIS — B373 Candidiasis of vulva and vagina: Secondary | ICD-10-CM | POA: Diagnosis not present

## 2015-04-08 DIAGNOSIS — B3731 Acute candidiasis of vulva and vagina: Secondary | ICD-10-CM

## 2015-04-08 LAB — WET PREP FOR TRICH, YEAST, CLUE
Clue Cells Wet Prep HPF POC: NONE SEEN
Trich, Wet Prep: NONE SEEN

## 2015-04-08 MED ORDER — BETAMETHASONE DIPROPIONATE 0.05 % EX CREA: TOPICAL_CREAM | Freq: Two times a day (BID) | CUTANEOUS | Status: DC

## 2015-04-08 NOTE — Patient Instructions (Signed)
Heat Rash Heat rash (miliaria) is a skin irritation caused by heavy sweating during hot, humid weather. It results from blockage of the sweat glands on our body. It can occur at any age. It is most common in young children whose sweat ducts are still developing or are not fully developed. Tight clothing may make the condition worse. Heat rash can look like small blisters (vesicles) that break open easily with bathing or minimal pressure. These blisters are found most commonly on the face, upper trunk of children and the trunk of adults. It can also look like a red cluster of red bumps or pimples (pustules). These usually itch and can also sometimes burn. It is more likely to occur on the neck and upper chest, in the groin, under the breasts, and in elbow creases. HOME CARE INSTRUCTIONS   The best treatment for heat rash is to provide a cooler, less humid environment where sweating is much decreased.  Keep the affected area dry. Dusting powder (cornstarch powder, baby powder) may be used to increase comfort. Avoid using ointments or creams. They keep the skin warm and moist and may make the condition worse.  Treating heat rash is simple and usually does not require medical assistance. SEEK MEDICAL CARE IF:   There is any evidence of infection such as fever, redness, swelling.  There is discomfort such as pain.  The skin lesions do no resolve with cooler, dryer environment. MAKE SURE YOU:   Understand these instructions.  Will watch your condition.  Will get help right away if you are not doing well or get worse. Document Released: 09/19/2009 Document Revised: 12/24/2011 Document Reviewed: 09/19/2009 ExitCare Patient Information 2015 [employer]. This information is not intended to replace advice given to you by your health care provider. Make sure you discuss any questions you have with your health care provider.  

## 2015-04-08 NOTE — Progress Notes (Signed)
Patient ID: Amanda Wilkins, female   DOB: Aug 22, 1946, 69 y.o.   MRN: [id number] Presents with complaint of vaginal irritation /rash, started several days ago. Tried over-the-counter Monistat which caused burning sensation. Minimal itching, irritation most bothersome. Denies discharge, abdominal pain, or fever Was treated for sepsis in the hospital with multiple antibiotics months ago, treated for yeast one month ago. Currently on Coumadin for A. fib, hypertension.  Exam: Appears well. External genitalia excoriated area  extending into inner thighs from perineum. No open areas, blisters or lesions. Appears similar to a heat rash. Speculum exam scant discharge minimal vaginal erythema, wet prep positive for few yeast.  Perineal skin rash Minimal yeast  Plan: Options reviewed, Diprolene 0.05% cream twice daily small amount, loose clothing, open to air as able, call if no relief in one week. Reviewed best not to use Diflucan while on Coumadin, Terazol may cause irritation.

## 2015-04-12 ENCOUNTER — Ambulatory Visit (INDEPENDENT_AMBULATORY_CARE_PROVIDER_SITE_OTHER): Payer: Medicare Other | Admitting: General Practice

## 2015-04-12 DIAGNOSIS — I4891 Unspecified atrial fibrillation: Secondary | ICD-10-CM | POA: Diagnosis not present

## 2015-04-12 DIAGNOSIS — Z5181 Encounter for therapeutic drug level monitoring: Secondary | ICD-10-CM

## 2015-04-12 LAB — POCT INR: INR: 3.4

## 2015-04-12 NOTE — Progress Notes (Signed)
Pre visit review using our clinic review tool, if applicable. No additional management support is needed unless otherwise documented below in the visit note. 

## 2015-04-18 NOTE — Progress Notes (Signed)
I have reviewed and agree with the plan. 

## 2015-04-21 ENCOUNTER — Ambulatory Visit: Payer: Medicare Other

## 2015-04-21 VITALS — BP 155/87 | HR 81 | Wt 218.0 lb

## 2015-04-21 DIAGNOSIS — Z0130 Encounter for examination of blood pressure without abnormal findings: Secondary | ICD-10-CM

## 2015-04-21 NOTE — Progress Notes (Signed)
Patient is here today for a blood pressure check and heart rate check.  Blood Pressure Our reading 155/87  HR 81 and Regular  Right arm  Blood Pressure pt. Monitor     133/108 HR 89 and Regular Right arm  Patients blood pressure cuff is at the limit of needing a larger cuff  Patient is going to call the manufacture of her blood pressure cuff to check about getting it calibrated and getting a cuff one size larger.  Patient mentions that since her cardizem has been stopped she is having a little increase in palpitations.  Maybe one or two an hour while sitting  States dizziness is improving.  Worse standing after prolonged sitting.   Modified orthostatics:  Sitting b/p  142/87 P 86     Standing after 1 minute 130/76 89  Patient mentions that she would love to take celebrex for left knee pain but understands that she should not take it when on coumadin.  Would like something she could take for her arthritis that she could take with coumadin.  She would like to get off coumadin because she heard it is "bad stuff'.  Encouraged her to speak with her physician for alternatives.    I told her that I would pass the message along to her Dr. And nurse about desiring medication for arthritis and wanting to be off coumadin.

## 2015-04-23 ENCOUNTER — Other Ambulatory Visit: Payer: Self-pay | Admitting: Gynecology

## 2015-04-25 NOTE — Telephone Encounter (Signed)
Has RGCE scheduled for 04/29/15.

## 2015-04-26 ENCOUNTER — Ambulatory Visit (INDEPENDENT_AMBULATORY_CARE_PROVIDER_SITE_OTHER): Payer: Medicare Other | Admitting: Psychology

## 2015-04-26 DIAGNOSIS — F411 Generalized anxiety disorder: Secondary | ICD-10-CM | POA: Diagnosis not present

## 2015-04-26 DIAGNOSIS — F332 Major depressive disorder, recurrent severe without psychotic features: Secondary | ICD-10-CM

## 2015-04-29 ENCOUNTER — Encounter: Payer: Self-pay | Admitting: Gynecology

## 2015-04-29 ENCOUNTER — Ambulatory Visit
Admission: RE | Admit: 2015-04-29 | Discharge: 2015-04-29 | Disposition: A | Discharge status: Home / Self Care | Payer: Medicare Other | Source: Ambulatory Visit

## 2015-04-29 ENCOUNTER — Ambulatory Visit (INDEPENDENT_AMBULATORY_CARE_PROVIDER_SITE_OTHER): Payer: Medicare Other | Admitting: Gynecology

## 2015-04-29 VITALS — BP 130/80 | Ht 66.0 in | Wt 218.0 lb

## 2015-04-29 DIAGNOSIS — Z1231 Encounter for screening mammogram for malignant neoplasm of breast: Secondary | ICD-10-CM | POA: Diagnosis not present

## 2015-04-29 DIAGNOSIS — Z803 Family history of malignant neoplasm of breast: Secondary | ICD-10-CM

## 2015-04-29 DIAGNOSIS — Z01419 Encounter for gynecological examination (general) (routine) without abnormal findings: Secondary | ICD-10-CM

## 2015-04-29 DIAGNOSIS — Z780 Asymptomatic menopausal state: Secondary | ICD-10-CM | POA: Diagnosis not present

## 2015-04-29 NOTE — Patient Instructions (Signed)

## 2015-04-29 NOTE — Progress Notes (Signed)
Amanda Wilkins 12/21/1945 [id number]   History:    69 y.o.  for annual gyn exam with the only complaint is of hot flashes. Patient had previously been for many years on Premarin 0.3 mg daily with the addition of Provera 10 mg for 10 days of the month. She's having no menstrual bleeding. Her PCP is Dr. Alain Marion who is been treating her for hypertension and has been doing her blood work. Dr. wall has been following her for her atrial fibrillation. Patient had a negative colonoscopy in 2016.. In April of this year patient was hospitalized as a result of septicemia. See discharge summary dated 02/09/2015 for details. Patient had a normal bone density study in 2014. Patient with no past history of any abnormal Pap smear.  Past medical history,surgical history, family history and social history were all reviewed and documented in the EPIC chart.  Gynecologic History No LMP recorded. Patient is postmenopausal. Contraception: post menopausal status Last Pap: 2012. Results were: normal Last mammogram: 2016. Results were: normal  Obstetric History OB History  Gravida Para Term Preterm AB SAB TAB Ectopic Multiple Living  2 2 2       2     # Outcome Date GA Lbr Len/2nd Weight Sex Delivery Anes PTL Lv  2 Term     M Vag-Spont  N Y  1 Term     M CS-Unspec  N Y       ROS: A ROS was performed and pertinent positives and negatives are included in the history.  GENERAL: No fevers or chills. HEENT: No change in vision, no earache, sore throat or sinus congestion. NECK: No pain or stiffness. CARDIOVASCULAR: No chest pain or pressure. No palpitations. PULMONARY: No shortness of breath, cough or wheeze. GASTROINTESTINAL: No abdominal pain, nausea, vomiting or diarrhea, melena or bright red blood per rectum. GENITOURINARY: No urinary frequency, urgency, hesitancy or dysuria. MUSCULOSKELETAL: No joint or muscle pain, no back pain, no recent trauma. DERMATOLOGIC: No rash, no itching, no lesions. ENDOCRINE:  No polyuria, polydipsia, no heat or cold intolerance. No recent change in weight. HEMATOLOGICAL: No anemia or easy bruising or bleeding. NEUROLOGIC: No headache, seizures, numbness, tingling or weakness. PSYCHIATRIC: No depression, no loss of interest in normal activity or change in sleep pattern.     Exam: chaperone present  BP 130/80 mmHg  Ht 5\' 6"  (1.676 m)  Wt 218 lb (98.884 kg)  BMI 35.20 kg/m2  Body mass index is 35.2 kg/(m^2).  General appearance : Well developed well nourished female. No acute distress HEENT: Eyes: no retinal hemorrhage or exudates,  Neck supple, trachea midline, no carotid bruits, no thyroidmegaly Lungs: Clear to auscultation, no rhonchi or wheezes, or rib retractions  Heart: Regular rate and rhythm, no murmurs or gallops Breast:Examined in sitting and supine position were symmetrical in appearance, no palpable masses or tenderness,  no skin retraction, no nipple inversion, no nipple discharge, no skin discoloration, no axillary or supraclavicular lymphadenopathy Abdomen: no palpable masses or tenderness, no rebound or guarding Extremities: no edema or skin discoloration or tenderness  Pelvic:  Bartholin, Urethra, Skene Glands: Within normal limits             Vagina: No gross lesions or discharge, atrophic changes  Cervix: No gross lesions or discharge  Uterus  axial, normal size, shape and consistency, non-tender and mobile  Adnexa  Without masses or tenderness  Anus and perineum  normal   Rectovaginal  normal sphincter tone without palpated masses or tenderness  Hemoccult normal colonoscopy this year     Assessment/Plan:  69 y.o. female for annual exam complaining of hot flashes since she came off the Premarin. She had come off of it because she had been on it for over 10 years. We previously discussed the women's health initiative study and potential risk of breast cancer. I more to offer her that she can by online and natural food supplement  called which she  is called Relizen and she can take 2 tablets daily. Also to help with her hot flashes she came by at the natural food store peppermint oil and apply two dabbs by each ear 2-3 times a day when necessary. Pap smear no longer indicated. Her PCP we'll be doing her blood work. We discussed importance of calcium vitamin D and regular exercise for osteoporosis prevention. Patient was schedule a bone density study.   Terrance Mass MD, 4:29 PM 04/29/2015

## 2015-05-02 ENCOUNTER — Ambulatory Visit (INDEPENDENT_AMBULATORY_CARE_PROVIDER_SITE_OTHER): Payer: Medicare Other | Admitting: Psychology

## 2015-05-02 DIAGNOSIS — F411 Generalized anxiety disorder: Secondary | ICD-10-CM | POA: Diagnosis not present

## 2015-05-02 DIAGNOSIS — F332 Major depressive disorder, recurrent severe without psychotic features: Secondary | ICD-10-CM | POA: Diagnosis not present

## 2015-05-03 ENCOUNTER — Telehealth: Payer: Self-pay | Admitting: Internal Medicine

## 2015-05-03 MED ORDER — TRAMADOL-ACETAMINOPHEN 37.5-325 MG PO TABS: 0.5000 | ORAL_TABLET | Freq: Three times a day (TID) | ORAL | Status: DC | PRN

## 2015-05-03 NOTE — Telephone Encounter (Signed)
New message      Pt called very angry and frustrated because no one has returned her call from several days ago regarding taking celebrex for her arthritis.  She would not go into details with me because she said she is very frustrated and is considering changing doctors.  Please call

## 2015-05-03 NOTE — Telephone Encounter (Signed)
Patient mentions that she would love to take celebrex for left knee pain but understands that she should not take it when on coumadin.  Would like something she could take for her arthritis that she could take with coumadin.   VM left to call back

## 2015-05-03 NOTE — Telephone Encounter (Signed)
This is the first time i saw note. Agree with Ultracet

## 2015-05-03 NOTE — Telephone Encounter (Signed)
Pt informed/agrees - new Rx sent.

## 2015-05-03 NOTE — Telephone Encounter (Signed)
We can try Ultacet (Tylenol+Tramadol combo) which should be safe w/Coumadin if [name] would like to try. Thx

## 2015-05-03 NOTE — Telephone Encounter (Signed)
Follow Up ° °Pt returned call//  °

## 2015-05-03 NOTE — Telephone Encounter (Signed)
Returned call to patient.  Told her I am sorry that she hasn't heard back.  I told her I resent message to Dr. [name].  I asked her how long she would like to wait until she hears from Dr. [name] and replied 2 days.  I told her she might want to also consider calling her PCP Dr. Alain Marion who is aware of coumadin and be able to give her a medication she can take for her arthritis pain  I told her I would call her back in 2 days

## 2015-05-10 ENCOUNTER — Ambulatory Visit (INDEPENDENT_AMBULATORY_CARE_PROVIDER_SITE_OTHER): Payer: Medicare Other | Admitting: General Practice

## 2015-05-10 DIAGNOSIS — Z5181 Encounter for therapeutic drug level monitoring: Secondary | ICD-10-CM | POA: Diagnosis not present

## 2015-05-10 DIAGNOSIS — I4891 Unspecified atrial fibrillation: Secondary | ICD-10-CM | POA: Diagnosis not present

## 2015-05-10 LAB — POCT INR: INR: 1.9

## 2015-05-10 NOTE — Progress Notes (Signed)
I have reviewed and agree with the plan. 

## 2015-05-10 NOTE — Progress Notes (Signed)
Pre visit review using our clinic review tool, if applicable. No additional management support is needed unless otherwise documented below in the visit note. 

## 2015-05-10 NOTE — Progress Notes (Signed)
Patient ID: Amanda Wilkins, female   DOB: 1946-09-09, 69 y.o.   MRN: [id number]    DATE:  02/10/15 MRN:  [id number]  BIRTHDAY: August 24, 1946  Facility:  [facility] Home Location:  [place] Room Number: 208-P  LEVEL OF CARE:  SNF [id number])  Contact Information    Name Relation Home Work Crivitz Spouse [phone]  [phone]   Arnesha, Schiraldi   [phone]   Ivett, Luebbe   [phone]       Chief Complaint  Patient presents with  . Hospitalization Follow-up    physical deconditioning, septic shock due to group A streptococcus cellulitis, atrial fibrillation, anemia, acute kidney injury, HSV1, HRT and migraine    HISTORY OF PRESENT ILLNESS:  This is a 69 year old female who was been admitted to [facility] on 02/09/15 from [facility]. She has PMH of atrial fibrillation, migraine on (Zoloft), hypertension, chronic interstitial nephritis, depression and arthritis. She was having nausea/vomiting, general malaise, diarrhea, fever and palpitations. She went to ED for further evaluation. She was admitted to the hospital with acute viral gastroenteritis and lactic acidosis. CT chest showed inflammation in the right axilla extending inferiorly along the right chest, no focal evidence of abscess or gas. CT abdomen and pelvis showed no renal obstruction, gallbladder distention with possible gallstones. Renal ultrasound showed no hydronephrosis, cholelithiasis. She became hypotensive, tachycardic with respiratory distress. She required critical care support for vasopressors. MRI of the flank showed no focal pockets of infection/abscess. ID, Dr. Linus Salmons, recommended to continue IV ceftriaxone and has completed 4/24.  She was given IV Cardizem due to heart rate in 140s and switched back to diltiazem by mouth. Cardiology recommended anticoagulation and Coumadin was started on 4/22.  She has been admitted for a short-term  rehabilitation.  PAST MEDICAL HISTORY:  Past Medical History  Diagnosis Date  . Allergy   . Atrial fibrillation   . Migraines     on Zoloft for migraines  . Hypertension   . Chronic interstitial nephritis   . Depression   . Palpitations   . Obesity   . Arthritis     ON BOTH KNEES    CURRENT MEDICATIONS: Reviewed per [facility]  Allergies  Allergen Reactions  . Epinephrine Base Other (See Comments)    Seriously increases heart rate  . Aspirin Other (See Comments)    Can take the coated 325 mg. Plain asa 325 mg speeds up the heart.  . Penicillins Other (See Comments)    Pt previously has been told not to take because of family history of reactions (water blisters). She took amoxicillin in 2012-2013 with no reaction  . Tape Other (See Comments)    Blisters from adhesive tape     REVIEW OF SYSTEMS:  GENERAL: no change in appetite EYES: Denies change in vision, dry eyes, eye pain, itching or discharge EARS: Denies change in hearing, ringing in ears, or earache NOSE: Denies nasal congestion or epistaxis MOUTH and THROAT: Denies oral discomfort, gingival pain or bleeding, pain from teeth or hoarseness   RESPIRATORY: no cough, DOE, wheezing, hemoptysis CARDIAC: no chest pain, edema GI: no abdominal pain, diarrhea, constipation, heart burn, nausea or vomiting GU: Denies dysuria, frequency, hematuria, incontinence, or discharge PSYCHIATRIC: Denies feeling of depression or anxiety. No report of hallucinations, insomnia, paranoia, or agitation   PHYSICAL EXAMINATION  GENERAL APPEARANCE: Well nourished. In no acute distress. obese SKIN:  Skin is warm and dry. There are no suspicious lesions or rash HEAD:  Normal in size and contour. No evidence of trauma EYES: Lids open and close normally. No blepharitis, entropion or ectropion. PERRL. Conjunctivae are clear and sclerae are white. Lenses are without opacity EARS: Pinnae are normal. Patient hears normal voice tunes of the  examiner MOUTH and THROAT: upper lip with lesion. Oral mucosa is moist and without lesions. Tongue is normal in shape, size, and color and without lesions NECK: supple, trachea midline, no neck masses, no thyroid tenderness, no thyromegaly LYMPHATICS: no LAN in the neck, no supraclavicular LAN RESPIRATORY: breathing is even & unlabored, BS CTAB CARDIAC: Irregular, no murmur,no extra heart sounds, no edema GI: abdomen soft, normal BS, no masses, no tenderness, no hepatomegaly, no splenomegaly MUSCULOSKELETAL: No deformities. Movement at each extremity is full and painless. Strength is 5/5 at each extremity. Back is without kyphosis or scoliosis CIRCULATION: pedal pulses are 2+. There is no edema of the legs, ankles and feet NEUROLOGICAL: There is no tremor. Speech is clear PSYCHIATRIC: Alert and oriented X 3. Affect and behavior are appropriate  LABS/RADIOLOGY: Labs reviewed: Basic Metabolic Panel:  Recent Labs  01/26/15 0400 01/27/15 0420  01/31/15 0555 02/01/15 0422 02/02/15 0230 02/03/15 0440  02/09/15 0425    NA 126* 128*  < > 131* 129* 130* 132*  < > 135    K 3.0* 3.6  < > 3.9 3.1* 3.0* 2.9*  < > 3.6    CL 94* 94*  < > 93* 90* 91* 93*  < > 104    CO2 15* 19  < > 26 26 27 28   < > 22    GLUCOSE 111* 123*  < > 130* 105* 110* 114*  < > 104*    BUN 79* 83*  < > 33* 36* 37* 39*  < > 31*    CREATININE 4.66* 5.00*  < > 3.45* 3.81* 3.93* 3.87*  < > 2.81*    CALCIUM 7.4* 7.8*  < > 8.3* 8.3* 8.1* 8.2*  < > 8.6    MG 2.4 2.6*  --   --   --  1.6  --   --   --     PHOS 5.6* 5.3*  < > 5.9* 6.7*  --  5.9*  --   --     < > = values in this interval not displayed.   Liver Function Tests:  Recent Labs   01/29/15 0713  02/02/15 0230 02/03/15 0440   AST 35  --  23  --    ALT 41*  --  23  --    ALKPHOS 109  --  57  --    BILITOT 0.6  --  0.5  --    PROT 5.7*  --  5.2*  --    ALBUMIN 2.2*  < > 2.1* 2.3*   < > = values in this interval not displayed.   CBC:   Recent Labs   02/02/15 0230  02/09/15 0425    WBC 11.3*  < > 5.7    NEUTROABS 8.8*  --   --     HGB 9.3*  < > 8.1*    HCT 26.6*  < > 24.4*    MCV 96.0  < > 100.0    PLT 211  < > 237    < > = values in this interval not displayed.   Lipid Panel:  Recent Labs   11/11/14 0943   HDL 68.70     Cardiac Enzymes:  Recent Labs  01/23/15 1050  01/23/15 1230  TROPONINI 0.03 <0.03   CBG:  Recent Labs  02/09/15 0618 02/09/15 1135 02/09/15 1639  GLUCAP 121* 114* 115*    ASSESSMENT/PLAN:  Physical deconditioning - for rehabilitation   Septic shock due to group a strep cellulitis, erysipelas/chest wall induration with Streptococcus bacteremia - completed IV ceftriaxone; shoulder pain now resolved  Atrial fibrillation with RVR - rate controlled; continue Coumadin 5 mg daily except Thursday Saturday and Sunday; Cardizem 180 mg 1 capsule by mouth twice a day and Zebeta 10 mg 1 tab by mouth twice a day  Anemia of chronic disease - hemoglobin 8.1; GI consulted, EGD done shows normal appearing esophagus and GE junction, there is, was well visualized and normal in appearance and normal appearing duodenum; GI recommended to stay off NSAIDs; for possible colonoscopy; follow-up with GI  Acute kidney injury - creatinine 2.81; likely due to sepsis/septic shock, A. fib, hypotension and NSAIDs; nephrology consulted, HD catheter removed; follow-up with nephrology  HSV 1 - continue Zovirax 5% apply topically to lips every 3 hours 10 days and Valtrex 1000 mg 1 by mouth daily 14 days  HRT - continue Provera 10 mg by mouth on the 10th day of the month and Premarin 3 mg by mouth daily  Migraine - continue Zoloft 50 mg by mouth daily   Goals of care:  Short-term rehabilitation    [facility], NP [facility] [phone]

## 2015-05-16 ENCOUNTER — Ambulatory Visit: Payer: Medicare Other | Admitting: Psychology

## 2015-05-17 ENCOUNTER — Other Ambulatory Visit: Payer: Self-pay | Admitting: Gynecology

## 2015-05-17 ENCOUNTER — Other Ambulatory Visit (INDEPENDENT_AMBULATORY_CARE_PROVIDER_SITE_OTHER): Payer: Medicare Other

## 2015-05-17 ENCOUNTER — Encounter: Payer: Self-pay | Admitting: Internal Medicine

## 2015-05-17 ENCOUNTER — Ambulatory Visit (INDEPENDENT_AMBULATORY_CARE_PROVIDER_SITE_OTHER): Payer: Medicare Other

## 2015-05-17 ENCOUNTER — Ambulatory Visit (INDEPENDENT_AMBULATORY_CARE_PROVIDER_SITE_OTHER): Payer: Medicare Other | Admitting: Internal Medicine

## 2015-05-17 VITALS — BP 130/82 | HR 94 | Temp 98.6°F | Resp 16 | Ht 66.0 in | Wt 218.0 lb

## 2015-05-17 DIAGNOSIS — Z780 Asymptomatic menopausal state: Secondary | ICD-10-CM | POA: Diagnosis not present

## 2015-05-17 DIAGNOSIS — N3000 Acute cystitis without hematuria: Secondary | ICD-10-CM

## 2015-05-17 DIAGNOSIS — B029 Zoster without complications: Secondary | ICD-10-CM | POA: Insufficient documentation

## 2015-05-17 HISTORY — DX: Acute cystitis without hematuria: N30.00

## 2015-05-17 LAB — URINALYSIS, ROUTINE W REFLEX MICROSCOPIC
Bilirubin Urine: NEGATIVE
Hgb urine dipstick: NEGATIVE
Ketones, ur: NEGATIVE
LEUKOCYTES UA: NEGATIVE
Nitrite: NEGATIVE
PH: 5.5 (ref 5.0–8.0)
SPECIFIC GRAVITY, URINE: 1.02 (ref 1.000–1.030)
Total Protein, Urine: NEGATIVE
Urine Glucose: NEGATIVE
Urobilinogen, UA: 0.2 (ref 0.0–1.0)

## 2015-05-17 NOTE — Progress Notes (Signed)
Pre visit review using our clinic review tool, if applicable. No additional management support is needed unless otherwise documented below in the visit note. 

## 2015-05-17 NOTE — Progress Notes (Signed)
Subjective:  Patient ID: Amanda Wilkins, female    DOB: 08-Apr-1946  Age: 69 y.o. MRN: [id number]  CC: Rash   HPI [name] presents for painful rash over her left upper back for 1 week and to recheck her urine. She was not able to see what the rash looked like. The rash is quickly diminishing. She has a history of urosepsis and though her urine is not bothering her today she wants to have it rechecked.  Outpatient Prescriptions Prior to Visit  Medication Sig Dispense Refill  . ALPRAZolam (XANAX) 0.25 MG tablet Take 1 tablet (0.25 mg total) by mouth 2 (two) times daily as needed for anxiety. 60 tablet 5  . bisoprolol (ZEBETA) 10 MG tablet Take 1 tablet (10 mg total) by mouth daily. 30 tablet 3  . Cholecalciferol (VITAMIN D) 2000 UNITS tablet Take 2,000 Units by mouth daily.    [name] CRANBERRY PO Take 1 tablet by mouth daily.    . Multiple Vitamin (MULTIVITAMIN) capsule Take 1 capsule by mouth daily.    . sertraline (ZOLOFT) 50 MG tablet TAKE 1 TABLET BY MOUTH DAILY 90 tablet 0  . traMADol-acetaminophen (ULTRACET) 37.5-325 MG per tablet Take 0.5-1 tablets by mouth every 8 (eight) hours as needed for moderate pain or severe pain. 90 tablet 3  . warfarin (COUMADIN) 5 MG tablet Take as directed by anticoagulation clinic 40 tablet 3   No facility-administered medications prior to visit.    ROS Review of Systems  Constitutional: Negative.  Negative for fever, chills, diaphoresis, appetite change and fatigue.  HENT: Negative.   Eyes: Negative.   Respiratory: Negative.  Negative for cough, choking, chest tightness, shortness of breath and stridor.   Cardiovascular: Negative.  Negative for chest pain, palpitations and leg swelling.  Gastrointestinal: Negative.  Negative for nausea, vomiting, abdominal pain, diarrhea, constipation and blood in stool.  Endocrine: Negative.   Genitourinary: Negative.  Negative for dysuria, urgency, frequency, flank pain, decreased urine volume and  difficulty urinating.  Musculoskeletal: Negative.   Skin: Positive for rash.  Allergic/Immunologic: Negative.   Neurological: Negative.   Hematological: Negative.  Negative for adenopathy. Does not bruise/bleed easily.  Psychiatric/Behavioral: Negative.     Objective:  BP 130/82 mmHg  Pulse 94  Temp(Src) 98.6 F (37 C) (Oral)  Resp 16  Ht 5\' 6"  (1.676 m)  Wt 218 lb (98.884 kg)  BMI 35.20 kg/m2  SpO2 95%  BP Readings from Last 3 Encounters:  05/17/15 130/82  04/29/15 130/80  04/21/15 155/87    Wt Readings from Last 3 Encounters:  05/17/15 218 lb (98.884 kg)  04/29/15 218 lb (98.884 kg)  04/21/15 218 lb (98.884 kg)    Physical Exam  Constitutional: She is oriented to person, place, and time.  Non-toxic appearance. She does not have a sickly appearance. She does not appear ill. No distress.  HENT:  Mouth/Throat: Oropharynx is clear and moist. No oropharyngeal exudate.  Eyes: Conjunctivae are normal. Right eye exhibits no discharge. Left eye exhibits no discharge. No scleral icterus.  Neck: Normal range of motion. Neck supple. No JVD present. No tracheal deviation present. No thyromegaly present.  Cardiovascular: Normal rate, regular rhythm, normal heart sounds and intact distal pulses.  Exam reveals no gallop and no friction rub.   No murmur heard. Pulmonary/Chest: Effort normal and breath sounds normal. No stridor. No respiratory distress. She has no wheezes. She has no rales. She exhibits no tenderness.  Abdominal: Soft. Bowel sounds are normal. She exhibits no distension and no  mass. There is no tenderness. There is no rebound and no guarding.  Musculoskeletal: Normal range of motion. She exhibits no edema or tenderness.  Lymphadenopathy:    She has no cervical adenopathy.  Neurological: She is oriented to person, place, and time.  Skin: Skin is warm and dry. Abrasion and rash noted. No bruising, no ecchymosis and no purpura noted. Rash is not macular, not papular, not  maculopapular, not nodular, not pustular, not vesicular and not urticarial. She is not diaphoretic. No erythema. No pallor.       Lab Results  Component Value Date   WBC 8.4 03/15/2015   HGB 12.1 03/15/2015   HCT 35.7* 03/15/2015   PLT 240.0 03/15/2015   GLUCOSE 98 03/15/2015   CHOL 224* 03/23/2015   TRIG 106.0 03/23/2015   HDL 61.30 03/23/2015   LDLCALC 142* 03/23/2015   ALT 25 03/15/2015   AST 22 03/15/2015   NA 135 03/15/2015   K 4.4 03/15/2015   CL 101 03/15/2015   CREATININE 1.25* 03/15/2015   BUN 22 03/15/2015   CO2 26 03/15/2015   TSH 2.70 03/15/2015   INR 1.9 05/10/2015   HGBA1C 5.8* 01/30/2015    Mm Screening Breast Tomo Bilateral  04/29/2015   CLINICAL DATA:  Screening.  EXAM: DIGITAL SCREENING BILATERAL MAMMOGRAM WITH 3D TOMO WITH CAD  COMPARISON:  Previous exam(s).  ACR Breast Density Category b: There are scattered areas of fibroglandular density.  FINDINGS: There are no findings suspicious for malignancy. Images were processed with CAD.  IMPRESSION: No mammographic evidence of malignancy. A result letter of this screening mammogram will be mailed directly to the patient.  RECOMMENDATION: Screening mammogram in one year. (Code:SM-B-01Y)  BI-RADS CATEGORY  1: Negative.   Electronically Signed   By: Evangeline Dakin M.D.   On: 04/29/2015 15:19    Assessment & Plan:   Pantera was seen today for rash.  Diagnoses and all orders for this visit:  Acute cystitis without hematuria- I will recheck her UA and her urine culture. Orders: -     Urinalysis, Routine w reflex microscopic (not at [facility]); Future -     CULTURE, URINE COMPREHENSIVE; Future   I am having [name] maintain her Vitamin D, warfarin, bisoprolol, ALPRAZolam, multivitamin, CRANBERRY PO, sertraline, and traMADol-acetaminophen.  No orders of the defined types were placed in this encounter.     Follow-up: No Follow-up on file.  [name], MD

## 2015-05-17 NOTE — Assessment & Plan Note (Signed)
There is a very small area of involvement. This appears to have resolved. She has had a very attenuated course of infection because she has previously been vaccinated with Zostavax. I do not think this needs to be treated. She will let me know if the rash recurs or spreads.

## 2015-05-17 NOTE — Patient Instructions (Signed)

## 2015-05-18 ENCOUNTER — Encounter: Payer: Self-pay | Admitting: Internal Medicine

## 2015-05-19 ENCOUNTER — Encounter: Payer: Self-pay | Admitting: Internal Medicine

## 2015-05-19 LAB — CULTURE, URINE COMPREHENSIVE

## 2015-05-19 MED ORDER — CIPROFLOXACIN HCL 250 MG PO TABS: 250.0000 mg | ORAL_TABLET | Freq: Two times a day (BID) | ORAL | Status: AC

## 2015-05-19 NOTE — Addendum Note (Signed)
Addended by: Janith Lima on: 05/19/2015 03:19 PM   Modules accepted: Orders

## 2015-05-24 ENCOUNTER — Ambulatory Visit (INDEPENDENT_AMBULATORY_CARE_PROVIDER_SITE_OTHER): Payer: Medicare Other | Admitting: General Practice

## 2015-05-24 ENCOUNTER — Telehealth: Payer: Self-pay | Admitting: Internal Medicine

## 2015-05-24 DIAGNOSIS — I4891 Unspecified atrial fibrillation: Secondary | ICD-10-CM | POA: Diagnosis not present

## 2015-05-24 DIAGNOSIS — Z5181 Encounter for therapeutic drug level monitoring: Secondary | ICD-10-CM | POA: Diagnosis not present

## 2015-05-24 LAB — POCT INR: INR: 4.6

## 2015-05-24 NOTE — Progress Notes (Signed)
I have reviewed and agree with the plan. 

## 2015-05-24 NOTE — Telephone Encounter (Signed)
Can you please call patient. Dr. Ronnald Ramp recommended she get her warfin checked because of some antibiotics she's on

## 2015-05-24 NOTE — Progress Notes (Signed)
Pre visit review using our clinic review tool, if applicable. No additional management support is needed unless otherwise documented below in the visit note. 

## 2015-05-27 ENCOUNTER — Encounter: Payer: Self-pay | Admitting: Internal Medicine

## 2015-05-27 ENCOUNTER — Other Ambulatory Visit (INDEPENDENT_AMBULATORY_CARE_PROVIDER_SITE_OTHER): Payer: Medicare Other

## 2015-05-27 ENCOUNTER — Ambulatory Visit (INDEPENDENT_AMBULATORY_CARE_PROVIDER_SITE_OTHER): Payer: Medicare Other | Admitting: Internal Medicine

## 2015-05-27 ENCOUNTER — Ambulatory Visit (INDEPENDENT_AMBULATORY_CARE_PROVIDER_SITE_OTHER)
Admission: RE | Admit: 2015-05-27 | Discharge: 2015-05-27 | Disposition: A | Discharge status: Home / Self Care | Payer: Medicare Other | Source: Ambulatory Visit | Attending: Internal Medicine | Admitting: Internal Medicine

## 2015-05-27 VITALS — BP 138/76 | HR 80 | Temp 98.7°F | Wt 217.0 lb

## 2015-05-27 DIAGNOSIS — M2550 Pain in unspecified joint: Secondary | ICD-10-CM

## 2015-05-27 DIAGNOSIS — J399 Disease of upper respiratory tract, unspecified: Secondary | ICD-10-CM

## 2015-05-27 DIAGNOSIS — R05 Cough: Secondary | ICD-10-CM | POA: Diagnosis not present

## 2015-05-27 LAB — CBC WITH DIFFERENTIAL/PLATELET
BASOS PCT: 0.7 % (ref 0.0–3.0)
Basophils Absolute: 0 10*3/uL (ref 0.0–0.1)
Eosinophils Absolute: 0.2 10*3/uL (ref 0.0–0.7)
Eosinophils Relative: 3.6 % (ref 0.0–5.0)
HCT: 39.7 % (ref 36.0–46.0)
HEMOGLOBIN: 13.4 g/dL (ref 12.0–15.0)
Lymphocytes Relative: 37.3 % (ref 12.0–46.0)
Lymphs Abs: 2.3 10*3/uL (ref 0.7–4.0)
MCHC: 33.9 g/dL (ref 30.0–36.0)
MCV: 96.1 fl (ref 78.0–100.0)
MONOS PCT: 9.1 % (ref 3.0–12.0)
Monocytes Absolute: 0.6 10*3/uL (ref 0.1–1.0)
NEUTROS ABS: 3.1 10*3/uL (ref 1.4–7.7)
Neutrophils Relative %: 49.3 % (ref 43.0–77.0)
PLATELETS: 170 10*3/uL (ref 150.0–400.0)
RBC: 4.13 Mil/uL (ref 3.87–5.11)
RDW: 14.1 % (ref 11.5–15.5)
WBC: 6.3 10*3/uL (ref 4.0–10.5)

## 2015-05-27 LAB — HEPATIC FUNCTION PANEL
ALBUMIN: 4.2 g/dL (ref 3.5–5.2)
ALT: 18 U/L (ref 0–35)
AST: 22 U/L (ref 0–37)
Alkaline Phosphatase: 75 U/L (ref 39–117)
BILIRUBIN TOTAL: 0.5 mg/dL (ref 0.2–1.2)
Bilirubin, Direct: 0.1 mg/dL (ref 0.0–0.3)
TOTAL PROTEIN: 7.8 g/dL (ref 6.0–8.3)

## 2015-05-27 LAB — BASIC METABOLIC PANEL
BUN: 19 mg/dL (ref 6–23)
CHLORIDE: 101 meq/L (ref 96–112)
CO2: 26 meq/L (ref 19–32)
CREATININE: 1.23 mg/dL — AB (ref 0.40–1.20)
Calcium: 9.9 mg/dL (ref 8.4–10.5)
GFR: 46.08 mL/min — ABNORMAL LOW (ref >60.00)
GLUCOSE: 99 mg/dL (ref 70–99)
Potassium: 4.3 mEq/L (ref 3.5–5.1)
Sodium: 136 mEq/L (ref 135–145)

## 2015-05-27 MED ORDER — LEVOFLOXACIN 500 MG PO TABS: 500.0000 mg | ORAL_TABLET | Freq: Every day | ORAL | Status: DC

## 2015-05-27 MED ORDER — PROMETHAZINE-CODEINE 6.25-10 MG/5ML PO SYRP: 5.0000 mL | ORAL_SOLUTION | ORAL | Status: DC | PRN

## 2015-05-27 MED ORDER — ATENOLOL 50 MG PO TABS
50.0000 mg | ORAL_TABLET | Freq: Every day | ORAL | Status: DC
Start: 2015-05-27 — End: 2016-02-21

## 2015-05-27 NOTE — Assessment & Plan Note (Signed)
8/16 ?lupus like syndrome due to Bisoprolol vs other Change to Atenolol Labs Tramadol prn

## 2015-05-27 NOTE — Assessment & Plan Note (Signed)
CXR Prom-Cod Levaquin x 7 d

## 2015-05-27 NOTE — Progress Notes (Signed)
Subjective:  Patient ID: Amanda Wilkins, female    DOB: 11-Oct-1946  Age: 69 y.o. MRN: [id number]  CC: No chief complaint on file.   HPI Amanda Wilkins presents for cough x 1 week. C/o severe joint ain and stiffness x 3 weeks   Outpatient Prescriptions Prior to Visit  Medication Sig Dispense Refill  . ALPRAZolam (XANAX) 0.25 MG tablet Take 1 tablet (0.25 mg total) by mouth 2 (two) times daily as needed for anxiety. 60 tablet 5  . Cholecalciferol (VITAMIN D) 2000 UNITS tablet Take 2,000 Units by mouth daily.    [name] CRANBERRY PO Take 1 tablet by mouth daily.    . Multiple Vitamin (MULTIVITAMIN) capsule Take 1 capsule by mouth daily.    . sertraline (ZOLOFT) 50 MG tablet TAKE 1 TABLET BY MOUTH DAILY 90 tablet 0  . traMADol-acetaminophen (ULTRACET) 37.5-325 MG per tablet Take 0.5-1 tablets by mouth every 8 (eight) hours as needed for moderate pain or severe pain. 90 tablet 3  . warfarin (COUMADIN) 5 MG tablet Take as directed by anticoagulation clinic 40 tablet 3  . bisoprolol (ZEBETA) 10 MG tablet Take 1 tablet (10 mg total) by mouth daily. 30 tablet 3   No facility-administered medications prior to visit.    ROS Review of Systems  Constitutional: Negative for chills, activity change, appetite change, fatigue and unexpected weight change.  HENT: Positive for voice change. Negative for congestion, mouth sores and sinus pressure.   Eyes: Negative for visual disturbance.  Respiratory: Positive for cough and chest tightness. Negative for wheezing.   Cardiovascular: Negative for chest pain.  Gastrointestinal: Negative for nausea and abdominal pain.  Genitourinary: Negative for frequency, difficulty urinating and vaginal pain.  Musculoskeletal: Positive for arthralgias. Negative for myalgias, back pain and gait problem.  Skin: Negative for pallor and rash.  Neurological: Negative for dizziness, tremors, weakness, numbness and headaches.  Psychiatric/Behavioral: Negative for  confusion and sleep disturbance.    Objective:  BP 138/76 mmHg  Pulse 80  Temp(Src) 98.7 F (37.1 C) (Oral)  Wt 217 lb (98.431 kg)  SpO2 95%  BP Readings from Last 3 Encounters:  05/27/15 138/76  05/17/15 130/82  04/29/15 130/80    Wt Readings from Last 3 Encounters:  05/27/15 217 lb (98.431 kg)  05/17/15 218 lb (98.884 kg)  04/29/15 218 lb (98.884 kg)    Physical Exam  Constitutional: She appears well-developed. No distress.  HENT:  Head: Normocephalic.  Right Ear: External ear normal.  Left Ear: External ear normal.  Nose: Nose normal.  Mouth/Throat: Oropharynx is clear and moist.  Eyes: Conjunctivae are normal. Pupils are equal, round, and reactive to light. Right eye exhibits no discharge. Left eye exhibits no discharge.  Neck: Normal range of motion. Neck supple. No JVD present. No tracheal deviation present. No thyromegaly present.  Cardiovascular: Normal rate, regular rhythm and normal heart sounds.   Pulmonary/Chest: No stridor. No respiratory distress. She has no wheezes.  Abdominal: Soft. Bowel sounds are normal. She exhibits no distension and no mass. There is no tenderness. There is no rebound and no guarding.  Musculoskeletal: She exhibits no edema or tenderness.  Lymphadenopathy:    She has no cervical adenopathy.  Neurological: She displays normal reflexes. No cranial nerve deficit. She exhibits normal muscle tone. Coordination normal.  Skin: No rash noted. No erythema.  Psychiatric: She has a normal mood and affect. Her behavior is normal. Judgment and thought content normal.  Hoarse   Lab Results  Component Value Date  WBC 8.4 03/15/2015   HGB 12.1 03/15/2015   HCT 35.7* 03/15/2015   PLT 240.0 03/15/2015   GLUCOSE 98 03/15/2015   CHOL 224* 03/23/2015   TRIG 106.0 03/23/2015   HDL 61.30 03/23/2015   LDLCALC 142* 03/23/2015   ALT 25 03/15/2015   AST 22 03/15/2015   NA 135 03/15/2015   K 4.4 03/15/2015   CL 101 03/15/2015   CREATININE 1.25*  03/15/2015   BUN 22 03/15/2015   CO2 26 03/15/2015   TSH 2.70 03/15/2015   INR 4.6 05/24/2015   HGBA1C 5.8* 01/30/2015    Mm Screening Breast Tomo Bilateral  04/29/2015   CLINICAL DATA:  Screening.  EXAM: DIGITAL SCREENING BILATERAL MAMMOGRAM WITH 3D TOMO WITH CAD  COMPARISON:  Previous exam(s).  ACR Breast Density Category b: There are scattered areas of fibroglandular density.  FINDINGS: There are no findings suspicious for malignancy. Images were processed with CAD.  IMPRESSION: No mammographic evidence of malignancy. A result letter of this screening mammogram will be mailed directly to the patient.  RECOMMENDATION: Screening mammogram in one year. (Code:SM-B-01Y)  BI-RADS CATEGORY  1: Negative.   Electronically Signed   By: Evangeline Dakin M.D.   On: 04/29/2015 15:19    Assessment & Plan:   Diagnoses and all orders for this visit:  Arthralgia -     CBC with Differential/Platelet; Future -     Basic metabolic panel; Future -     Hepatic function panel; Future -     Sedimentation rate; Future -     ANA; Future -     Rheumatoid factor; Future -     Urinalysis; Future  Upper respiratory disease -     CBC with Differential/Platelet; Future -     Basic metabolic panel; Future -     Hepatic function panel; Future -     Sedimentation rate; Future -     ANA; Future -     Rheumatoid factor; Future -     Urinalysis; Future -     DG Chest 2 View  Other orders -     levofloxacin (LEVAQUIN) 500 MG tablet; Take 1 tablet (500 mg total) by mouth daily. -     promethazine-codeine (PHENERGAN WITH CODEINE) 6.25-10 MG/5ML syrup; Take 5 mLs by mouth every 4 (four) hours as needed. -     atenolol (TENORMIN) 50 MG tablet; Take 1 tablet (50 mg total) by mouth daily.   I have discontinued Ms. Heitmeyer's bisoprolol. I am also having her start on levofloxacin, promethazine-codeine, and atenolol. Additionally, I am having her maintain her Vitamin D, warfarin, ALPRAZolam, multivitamin, CRANBERRY  PO, sertraline, and traMADol-acetaminophen.  Meds ordered this encounter  Medications  . levofloxacin (LEVAQUIN) 500 MG tablet    Sig: Take 1 tablet (500 mg total) by mouth daily.    Dispense:  7 tablet    Refill:  1  . promethazine-codeine (PHENERGAN WITH CODEINE) 6.25-10 MG/5ML syrup    Sig: Take 5 mLs by mouth every 4 (four) hours as needed.    Dispense:  300 mL    Refill:  0  . atenolol (TENORMIN) 50 MG tablet    Sig: Take 1 tablet (50 mg total) by mouth daily.    Dispense:  90 tablet    Refill:  3     Follow-up: No Follow-up on file.  [name], MD

## 2015-05-27 NOTE — Progress Notes (Signed)
Pre visit review using our clinic review tool, if applicable. No additional management support is needed unless otherwise documented below in the visit note. 

## 2015-05-28 LAB — RHEUMATOID FACTOR: Rhuematoid fact SerPl-aCnc: <10 IU/mL (ref <=14)

## 2015-05-30 LAB — SEDIMENTATION RATE: SED RATE: 41 mm/h — AB (ref 0–22)

## 2015-05-31 ENCOUNTER — Other Ambulatory Visit (INDEPENDENT_AMBULATORY_CARE_PROVIDER_SITE_OTHER): Payer: Medicare Other

## 2015-05-31 ENCOUNTER — Other Ambulatory Visit: Payer: Self-pay | Admitting: Internal Medicine

## 2015-05-31 DIAGNOSIS — J399 Disease of upper respiratory tract, unspecified: Secondary | ICD-10-CM

## 2015-05-31 LAB — URINALYSIS
BILIRUBIN URINE: NEGATIVE
HGB URINE DIPSTICK: NEGATIVE
KETONES UR: NEGATIVE
Leukocytes, UA: NEGATIVE
Nitrite: NEGATIVE
PH: 6 (ref 5.0–8.0)
Specific Gravity, Urine: 1.015 (ref 1.000–1.030)
Total Protein, Urine: NEGATIVE
URINE GLUCOSE: NEGATIVE
Urobilinogen, UA: 0.2 (ref 0.0–1.0)

## 2015-05-31 LAB — ANA: ANA: NEGATIVE

## 2015-06-03 DIAGNOSIS — M1712 Unilateral primary osteoarthritis, left knee: Secondary | ICD-10-CM | POA: Diagnosis not present

## 2015-06-07 ENCOUNTER — Ambulatory Visit (INDEPENDENT_AMBULATORY_CARE_PROVIDER_SITE_OTHER): Payer: Medicare Other | Admitting: General Practice

## 2015-06-07 ENCOUNTER — Ambulatory Visit: Payer: Self-pay

## 2015-06-07 DIAGNOSIS — I4891 Unspecified atrial fibrillation: Secondary | ICD-10-CM

## 2015-06-07 DIAGNOSIS — Z5181 Encounter for therapeutic drug level monitoring: Secondary | ICD-10-CM

## 2015-06-07 LAB — POCT INR: INR: 2.3

## 2015-06-07 NOTE — Progress Notes (Signed)
Pre visit review using our clinic review tool, if applicable. No additional management support is needed unless otherwise documented below in the visit note. 

## 2015-06-07 NOTE — Progress Notes (Signed)
I have reviewed and agree with the plan. 

## 2015-06-13 DIAGNOSIS — H43812 Vitreous degeneration, left eye: Secondary | ICD-10-CM | POA: Diagnosis not present

## 2015-06-13 DIAGNOSIS — H5315 Visual distortions of shape and size: Secondary | ICD-10-CM | POA: Diagnosis not present

## 2015-06-14 ENCOUNTER — Encounter: Payer: Self-pay | Admitting: Internal Medicine

## 2015-06-14 ENCOUNTER — Ambulatory Visit: Payer: Self-pay

## 2015-06-14 ENCOUNTER — Ambulatory Visit (INDEPENDENT_AMBULATORY_CARE_PROVIDER_SITE_OTHER): Payer: Medicare Other | Admitting: Internal Medicine

## 2015-06-14 VITALS — BP 138/80 | HR 77 | Wt 221.0 lb

## 2015-06-14 DIAGNOSIS — N289 Disorder of kidney and ureter, unspecified: Secondary | ICD-10-CM

## 2015-06-14 DIAGNOSIS — R7309 Other abnormal glucose: Secondary | ICD-10-CM | POA: Diagnosis not present

## 2015-06-14 DIAGNOSIS — Z23 Encounter for immunization: Secondary | ICD-10-CM | POA: Diagnosis not present

## 2015-06-14 DIAGNOSIS — I480 Paroxysmal atrial fibrillation: Secondary | ICD-10-CM | POA: Diagnosis not present

## 2015-06-14 DIAGNOSIS — H579 Unspecified disorder of eye and adnexa: Secondary | ICD-10-CM | POA: Diagnosis not present

## 2015-06-14 DIAGNOSIS — R739 Hyperglycemia, unspecified: Secondary | ICD-10-CM

## 2015-06-14 LAB — GLUCOSE, POCT (MANUAL RESULT ENTRY): POC Glucose: 88 mg/dl (ref 70–99)

## 2015-06-14 NOTE — Assessment & Plan Note (Signed)
2016 remote - recovering CRI

## 2015-06-14 NOTE — Progress Notes (Signed)
Pre visit review using our clinic review tool, if applicable. No additional management support is needed unless otherwise documented below in the visit note. 

## 2015-06-14 NOTE — Progress Notes (Signed)
Subjective:  Patient ID: Amanda Wilkins, female    DOB: [birth year]  Age: 69 y.o. MRN: [id number]  CC: No chief complaint on file.   HPI Ruchi Stoney presents for A fib, HTN, anxiety f/u. C/o eye sx: L eye  Outpatient Prescriptions Prior to Visit  Medication Sig Dispense Refill  . ALPRAZolam (XANAX) 0.25 MG tablet Take 1 tablet (0.25 mg total) by mouth 2 (two) times daily as needed for anxiety. 60 tablet 5  . atenolol (TENORMIN) 50 MG tablet Take 1 tablet (50 mg total) by mouth daily. 90 tablet 3  . Cholecalciferol (VITAMIN D) 2000 UNITS tablet Take 2,000 Units by mouth daily.    [name] CRANBERRY PO Take 1 tablet by mouth daily.    . Multiple Vitamin (MULTIVITAMIN) capsule Take 1 capsule by mouth daily.    . sertraline (ZOLOFT) 50 MG tablet TAKE 1 TABLET BY MOUTH DAILY 90 tablet 0  . warfarin (COUMADIN) 5 MG tablet Take as directed by anticoagulation clinic 40 tablet 3  . levofloxacin (LEVAQUIN) 500 MG tablet Take 1 tablet (500 mg total) by mouth daily. 7 tablet 1  . traMADol-acetaminophen (ULTRACET) 37.5-325 MG per tablet Take 0.5-1 tablets by mouth every 8 (eight) hours as needed for moderate pain or severe pain. (Patient not taking: Reported on 06/14/2015) 90 tablet 3  . promethazine-codeine (PHENERGAN WITH CODEINE) 6.25-10 MG/5ML syrup Take 5 mLs by mouth every 4 (four) hours as needed. (Patient not taking: Reported on 06/14/2015) 300 mL 0   No facility-administered medications prior to visit.   Past Medical History  Diagnosis Date  . Allergy   . Atrial fibrillation   . Migraines     on Zoloft for migraines  . Hypertension   . Chronic interstitial nephritis   . Depression   . Palpitations   . Obesity   . Arthritis     ON BOTH KNEES   Past Surgical History  Procedure Laterality Date  . Cesarean section    . Dental surgery      multiple implants  . Repair of torn meniscus on left 08/2014 Left 08/2015    Dr. Ronnie Derby at [facility]  . Esophagogastroduodenoscopy N/A  02/08/2015    Procedure: ESOPHAGOGASTRODUODENOSCOPY (EGD);  Surgeon: [name], MD;  Location: [place];  Service: Endoscopy;  Laterality: N/A;    reports that she has never smoked. She has never used smokeless tobacco. She reports that she drinks alcohol. She reports that she does not use illicit drugs. family history includes Cancer in her brother. Allergies  Allergen Reactions  . Epinephrine Base Other (See Comments)    Seriously increases heart rate  . Aspirin Other (See Comments)    Can take the coated 325 mg. Plain asa 325 mg speeds up the heart.  . Penicillins Other (See Comments)    Pt previously has been told not to take because of family history of reactions (water blisters). She took amoxicillin in 2012-2013 with no reaction  . Tape Other (See Comments)    Blisters from adhesive tape    ROS Review of Systems  Constitutional: Negative for chills, activity change, appetite change, fatigue and unexpected weight change.  HENT: Negative for congestion, mouth sores and sinus pressure.   Eyes: Positive for visual disturbance. Negative for pain, discharge and redness.  Respiratory: Negative for cough and chest tightness.   Gastrointestinal: Negative for nausea and abdominal pain.  Genitourinary: Negative for frequency, difficulty urinating and vaginal pain.  Musculoskeletal: Negative for back pain and gait problem.  Skin: Negative for pallor and rash.  Neurological: Negative for dizziness, tremors, weakness, numbness and headaches.  Psychiatric/Behavioral: Negative for confusion and sleep disturbance. The patient is not nervous/anxious.     Objective:  BP 138/80 mmHg  Pulse 77  Wt 221 lb (100.245 kg)  SpO2 97%  BP Readings from Last 3 Encounters:  06/14/15 138/80  05/27/15 138/76  05/17/15 130/82    Wt Readings from Last 3 Encounters:  06/14/15 221 lb (100.245 kg)  05/27/15 217 lb (98.431 kg)  05/17/15 218 lb (98.884 kg)    Physical Exam  Constitutional:  She appears well-developed. No distress.  HENT:  Head: Normocephalic.  Right Ear: External ear normal.  Left Ear: External ear normal.  Nose: Nose normal.  Mouth/Throat: Oropharynx is clear and moist.  Eyes: Conjunctivae are normal. Pupils are equal, round, and reactive to light. Right eye exhibits no discharge. Left eye exhibits no discharge.  Neck: Normal range of motion. Neck supple. No JVD present. No tracheal deviation present. No thyromegaly present.  Cardiovascular: Normal rate, regular rhythm and normal heart sounds.   Pulmonary/Chest: No stridor. No respiratory distress. She has no wheezes.  Abdominal: Soft. Bowel sounds are normal. She exhibits no distension and no mass. There is no tenderness. There is no rebound and no guarding.  Musculoskeletal: She exhibits no edema or tenderness.  Lymphadenopathy:    She has no cervical adenopathy.  Neurological: She displays normal reflexes. No cranial nerve deficit. She exhibits normal muscle tone. Coordination normal.  Skin: No rash noted. No erythema.  Psychiatric: She has a normal mood and affect. Her behavior is normal. Judgment and thought content normal.  Obese  Lab Results  Component Value Date   WBC 6.3 05/27/2015   HGB 13.4 05/27/2015   HCT 39.7 05/27/2015   PLT 170.0 05/27/2015   GLUCOSE 99 05/27/2015   CHOL 224* 03/23/2015   TRIG 106.0 03/23/2015   HDL 61.30 03/23/2015   LDLCALC 142* 03/23/2015   ALT 18 05/27/2015   AST 22 05/27/2015   NA 136 05/27/2015   K 4.3 05/27/2015   CL 101 05/27/2015   CREATININE 1.23* 05/27/2015   BUN 19 05/27/2015   CO2 26 05/27/2015   TSH 2.70 03/15/2015   INR 2.3 06/07/2015   HGBA1C 5.8* 01/30/2015    Dg Chest 2 View  05/27/2015   CLINICAL DATA:  Cough for 1 week  EXAM: CHEST - 2 VIEW  COMPARISON:  01/28/2015  FINDINGS: The heart size and mediastinal contours are within normal limits. Both lungs are clear. The visualized skeletal structures are unremarkable.  IMPRESSION: No active  disease.   Electronically Signed   By: Inez Catalina M.D.   On: 05/27/2015 21:45    Assessment & Plan:   Diagnoses and all orders for this visit:  Paroxysmal atrial fibrillation  Eye symptom -     POCT glucose (manual entry) -     Ambulatory referral to Ophthalmology  Need for influenza vaccination -     Flu Vaccine QUAD 36+ mos IM  Elevated glucose -     POCT glucose (manual entry)  Hyperglycemia  Acute renal insufficiency   I have discontinued Ms. Ludden's levofloxacin and promethazine-codeine. I am also having her maintain her Vitamin D, warfarin, ALPRAZolam, multivitamin, CRANBERRY PO, sertraline, traMADol-acetaminophen, and atenolol.  No orders of the defined types were placed in this encounter.     Follow-up: Return in about 3 months (around 09/14/2015) for a follow-up visit.  [name], MD

## 2015-06-14 NOTE — Assessment & Plan Note (Signed)
CBG 88 

## 2015-06-14 NOTE — Assessment & Plan Note (Signed)
8/16 L eye "electric lines" x4 days, intermittent

## 2015-06-16 DIAGNOSIS — H2511 Age-related nuclear cataract, right eye: Secondary | ICD-10-CM | POA: Diagnosis not present

## 2015-06-16 DIAGNOSIS — G43109 Migraine with aura, not intractable, without status migrainosus: Secondary | ICD-10-CM | POA: Diagnosis not present

## 2015-06-16 DIAGNOSIS — H18411 Arcus senilis, right eye: Secondary | ICD-10-CM | POA: Diagnosis not present

## 2015-06-16 DIAGNOSIS — H2512 Age-related nuclear cataract, left eye: Secondary | ICD-10-CM | POA: Diagnosis not present

## 2015-06-21 ENCOUNTER — Other Ambulatory Visit: Payer: Self-pay | Admitting: Internal Medicine

## 2015-06-21 ENCOUNTER — Encounter: Payer: Self-pay | Admitting: Internal Medicine

## 2015-06-21 ENCOUNTER — Ambulatory Visit (INDEPENDENT_AMBULATORY_CARE_PROVIDER_SITE_OTHER): Payer: Medicare Other

## 2015-06-21 ENCOUNTER — Ambulatory Visit (INDEPENDENT_AMBULATORY_CARE_PROVIDER_SITE_OTHER)
Admission: RE | Admit: 2015-06-21 | Discharge: 2015-06-21 | Disposition: A | Discharge status: Home / Self Care | Payer: Medicare Other | Source: Ambulatory Visit | Attending: Internal Medicine | Admitting: Internal Medicine

## 2015-06-21 DIAGNOSIS — M25572 Pain in left ankle and joints of left foot: Secondary | ICD-10-CM

## 2015-06-21 DIAGNOSIS — M79672 Pain in left foot: Secondary | ICD-10-CM | POA: Diagnosis not present

## 2015-06-21 DIAGNOSIS — M79671 Pain in right foot: Secondary | ICD-10-CM | POA: Diagnosis not present

## 2015-06-21 DIAGNOSIS — M25571 Pain in right ankle and joints of right foot: Secondary | ICD-10-CM

## 2015-06-21 DIAGNOSIS — Z23 Encounter for immunization: Secondary | ICD-10-CM

## 2015-06-21 DIAGNOSIS — M19171 Post-traumatic osteoarthritis, right ankle and foot: Secondary | ICD-10-CM | POA: Diagnosis not present

## 2015-06-24 DIAGNOSIS — G43109 Migraine with aura, not intractable, without status migrainosus: Secondary | ICD-10-CM | POA: Diagnosis not present

## 2015-06-27 ENCOUNTER — Other Ambulatory Visit: Payer: Self-pay | Admitting: Internal Medicine

## 2015-06-27 DIAGNOSIS — M2550 Pain in unspecified joint: Secondary | ICD-10-CM

## 2015-06-29 DIAGNOSIS — M79671 Pain in right foot: Secondary | ICD-10-CM | POA: Diagnosis not present

## 2015-06-29 DIAGNOSIS — R5383 Other fatigue: Secondary | ICD-10-CM | POA: Diagnosis not present

## 2015-06-29 DIAGNOSIS — M79641 Pain in right hand: Secondary | ICD-10-CM | POA: Diagnosis not present

## 2015-06-29 DIAGNOSIS — M170 Bilateral primary osteoarthritis of knee: Secondary | ICD-10-CM | POA: Diagnosis not present

## 2015-06-29 DIAGNOSIS — M65312 Trigger thumb, left thumb: Secondary | ICD-10-CM | POA: Diagnosis not present

## 2015-06-29 DIAGNOSIS — M064 Inflammatory polyarthropathy: Secondary | ICD-10-CM | POA: Diagnosis not present

## 2015-06-29 DIAGNOSIS — M79642 Pain in left hand: Secondary | ICD-10-CM | POA: Diagnosis not present

## 2015-06-29 DIAGNOSIS — M79672 Pain in left foot: Secondary | ICD-10-CM | POA: Diagnosis not present

## 2015-06-30 ENCOUNTER — Telehealth: Payer: Self-pay | Admitting: Internal Medicine

## 2015-06-30 DIAGNOSIS — H547 Unspecified visual loss: Secondary | ICD-10-CM

## 2015-06-30 NOTE — Telephone Encounter (Signed)
Pt called in and wanted to know if Dr Camila Li could refer her to  [place]: Hurman Horn MD   Address: [address], [place], [place] [address]  Phone: [phone]

## 2015-07-01 NOTE — Telephone Encounter (Signed)
Ok Thx 

## 2015-07-04 ENCOUNTER — Encounter: Payer: Self-pay | Admitting: Internal Medicine

## 2015-07-04 NOTE — Telephone Encounter (Signed)
Called pt no answer LMOM md has place referral once appt has been set up will get call from our [facility] with appt info...Amanda Wilkins

## 2015-07-05 ENCOUNTER — Ambulatory Visit (INDEPENDENT_AMBULATORY_CARE_PROVIDER_SITE_OTHER): Payer: Medicare Other | Admitting: General Practice

## 2015-07-05 DIAGNOSIS — Z5181 Encounter for therapeutic drug level monitoring: Secondary | ICD-10-CM

## 2015-07-05 DIAGNOSIS — I4891 Unspecified atrial fibrillation: Secondary | ICD-10-CM

## 2015-07-05 LAB — POCT INR: INR: 1.9

## 2015-07-05 NOTE — Progress Notes (Signed)
I have reviewed and agree with the plan. 

## 2015-07-05 NOTE — Progress Notes (Signed)
Pre visit review using our clinic review tool, if applicable. No additional management support is needed unless otherwise documented below in the visit note. 

## 2015-07-12 DIAGNOSIS — G43109 Migraine with aura, not intractable, without status migrainosus: Secondary | ICD-10-CM | POA: Diagnosis not present

## 2015-07-13 DIAGNOSIS — M79672 Pain in left foot: Secondary | ICD-10-CM | POA: Diagnosis not present

## 2015-07-13 DIAGNOSIS — M79671 Pain in right foot: Secondary | ICD-10-CM | POA: Diagnosis not present

## 2015-07-13 DIAGNOSIS — M0609 Rheumatoid arthritis without rheumatoid factor, multiple sites: Secondary | ICD-10-CM | POA: Diagnosis not present

## 2015-07-13 DIAGNOSIS — M170 Bilateral primary osteoarthritis of knee: Secondary | ICD-10-CM | POA: Diagnosis not present

## 2015-07-13 DIAGNOSIS — M79641 Pain in right hand: Secondary | ICD-10-CM | POA: Diagnosis not present

## 2015-07-13 DIAGNOSIS — M79642 Pain in left hand: Secondary | ICD-10-CM | POA: Diagnosis not present

## 2015-07-14 ENCOUNTER — Encounter: Payer: Self-pay | Admitting: *Deleted

## 2015-07-18 ENCOUNTER — Ambulatory Visit (INDEPENDENT_AMBULATORY_CARE_PROVIDER_SITE_OTHER): Payer: Medicare Other | Admitting: Internal Medicine

## 2015-07-18 ENCOUNTER — Encounter: Payer: Self-pay | Admitting: Internal Medicine

## 2015-07-18 VITALS — BP 130/84 | HR 80 | Ht 66.0 in | Wt 222.8 lb

## 2015-07-18 DIAGNOSIS — I480 Paroxysmal atrial fibrillation: Secondary | ICD-10-CM

## 2015-07-18 NOTE — Patient Instructions (Signed)
Your physician recommends that you continue on your current medications as directed. Please refer to the Current Medication list given to you today. Your physician wants you to follow-up in: May 2017 with Dr. [name].  You will receive a reminder letter in the mail two months in advance. If you don't receive a letter, please call our office to schedule the follow-up appointment.

## 2015-07-18 NOTE — Progress Notes (Signed)
Cardiology Office Note   Date:  07/18/2015   ID:  Amanda Wilkins, DOB [birth year], MRN [id number]  PCP:  [name], MD  Cardiologist:   [name], MD   No chief complaint on file.     History of Present Illness: Amanda Wilkins is a 69 y.o. female with a history of  history of CP, PAF and obesity  Echo prior was normal  Myoview showed mod sized defect at base and mid interolateral and inferior wall PConsistent with ischemia LVEF normal I felt may not be trure positive as pt's SOB was chornic Recomm cardiac CT but insurance declined.  She was admitted in April with N/V and weakness as well as diarrhea, fever, palpitations. She was treated for sepsis Was strep bacteremic. GI complaints felt viral. GI conslted EGD normal While in the hosp she had persistent afib    Since seen pt denies palpitations.  Breathing is stable  No CP  Bothered by  Eye problems   Arthritis hands and feet    Current Outpatient Prescriptions  Medication Sig Dispense Refill  . ALPRAZolam (XANAX) 0.25 MG tablet Take 1 tablet (0.25 mg total) by mouth 2 (two) times daily as needed for anxiety. 60 tablet 5  . atenolol (TENORMIN) 50 MG tablet Take 1 tablet (50 mg total) by mouth daily. 90 tablet 3  . Cholecalciferol (VITAMIN D) 2000 UNITS tablet Take 2,000 Units by mouth daily.    . hydroxychloroquine (PLAQUENIL) 200 MG tablet Take 200 mg by mouth daily. TAKE 2 TABLETS BY MOUTH ONCE DAILY  3  . Multiple Vitamin (MULTIVITAMIN) capsule Take 1 capsule by mouth daily.    . sertraline (ZOLOFT) 50 MG tablet TAKE 1 TABLET BY MOUTH DAILY 90 tablet 0  . warfarin (COUMADIN) 5 MG tablet Take as directed by anticoagulation clinic 40 tablet 3   No current facility-administered medications for this visit.    Allergies:   Epinephrine base; Aspirin; Penicillins; and Tape   Past Medical History  Diagnosis Date  . Allergy   . Atrial fibrillation ([place])   . Migraines     on Zoloft for migraines  .  Hypertension   . Chronic interstitial nephritis   . Depression   . Palpitations   . Obesity   . Arthritis     ON BOTH KNEES    Past Surgical History  Procedure Laterality Date  . Cesarean section    . Dental surgery      multiple implants  . Repair of torn meniscus on left 08/2014 Left 08/2015    Dr. Ronnie Derby at [facility]  . Esophagogastroduodenoscopy N/A 02/08/2015    Procedure: ESOPHAGOGASTRODUODENOSCOPY (EGD);  Surgeon: [name], MD;  Location: [place];  Service: Endoscopy;  Laterality: N/A;     Social History:  The patient  reports that she has never smoked. She has never used smokeless tobacco. She reports that she drinks alcohol. She reports that she does not use illicit drugs.   Family History:  The patient's family history includes Lymphoma in her mother; Pancreatic cancer in her brother; Prostate cancer in her father.    ROS:  Please see the history of present illness. All other systems are reviewed and  Negative to the above problem except as noted.    PHYSICAL EXAM: VS:  BP 130/84 mmHg  Pulse 80  Ht 5\' 6"  (1.676 m)  Wt 222 lb 12.8 oz (101.061 kg)  BMI 35.98 kg/m2  SpO2 98%  GEN: Well nourished, well developed, in no acute  distress HEENT: normal Neck: no JVD, carotid bruits, or masses Cardiac: RRR; no murmurs, rubs, or gallops,no edema  Respiratory:  clear to auscultation bilaterally, normal work of breathing GI: soft, nontender, nondistended, + BS  No hepatomegaly  MS: no deformity Moving all extremities   Skin: warm and dry, no rash Neuro:  Strength and sensation are intact Psych: euthymic mood, full affect   EKG:  EKG is not ordered today.   Lipid Panel    Component Value Date/Time   CHOL 224* 03/23/2015 0803   TRIG 106.0 03/23/2015 0803   HDL 61.30 03/23/2015 0803   CHOLHDL 4 03/23/2015 0803   VLDL 21.2 03/23/2015 0803   LDLCALC 142* 03/23/2015 0803      Wt Readings from Last 3 Encounters:  07/18/15 222 lb 12.8 oz (101.061 kg)    06/14/15 221 lb (100.245 kg)  05/27/15 217 lb (98.431 kg)      ASSESSMENT AND PLAN:  1.  PAF  Keep on same regimen   She had continuous afib while in hosp  I do not think it was strictly explained by the acute illness  2.  Abnormal myoview  With no active chest pain and no change in breathing I would not recomm further cardiac testing for now.     Signed, [name], MD  07/18/2015 12:04 PM    [address], [place], [place]  [address] Phone: [phone]; Fax: [phone]

## 2015-07-19 ENCOUNTER — Encounter: Payer: Self-pay | Admitting: Internal Medicine

## 2015-07-19 ENCOUNTER — Other Ambulatory Visit: Payer: Self-pay | Admitting: Internal Medicine

## 2015-07-19 DIAGNOSIS — H579 Unspecified disorder of eye and adnexa: Secondary | ICD-10-CM

## 2015-07-20 DIAGNOSIS — M65311 Trigger thumb, right thumb: Secondary | ICD-10-CM | POA: Diagnosis not present

## 2015-07-20 DIAGNOSIS — M65312 Trigger thumb, left thumb: Secondary | ICD-10-CM | POA: Diagnosis not present

## 2015-07-22 ENCOUNTER — Ambulatory Visit (INDEPENDENT_AMBULATORY_CARE_PROVIDER_SITE_OTHER): Payer: Medicare Other | Admitting: Neurology

## 2015-07-22 ENCOUNTER — Encounter: Payer: Self-pay | Admitting: Neurology

## 2015-07-22 VITALS — BP 120/72 | HR 84 | Resp 16 | Wt 222.0 lb

## 2015-07-22 DIAGNOSIS — I480 Paroxysmal atrial fibrillation: Secondary | ICD-10-CM

## 2015-07-22 DIAGNOSIS — H5310 Unspecified subjective visual disturbances: Secondary | ICD-10-CM

## 2015-07-22 DIAGNOSIS — E785 Hyperlipidemia, unspecified: Secondary | ICD-10-CM | POA: Diagnosis not present

## 2015-07-22 DIAGNOSIS — Z8669 Personal history of other diseases of the nervous system and sense organs: Secondary | ICD-10-CM | POA: Diagnosis not present

## 2015-07-22 NOTE — Patient Instructions (Addendum)
1. Schedule MRI brain with and without contrast 2. Schedule carotid dopplers 3. Bloodwork for lipid panel, ESR, CRP 4. Continue current medications 5. If symptoms change, go to ER immediately 6. Follow-up after tests  7. You have been scheduled at [facility] for MRI on 07/26/15. Please arrive @ 11:30am.   [place], [place] [address] [phone]

## 2015-07-22 NOTE — Progress Notes (Signed)
NEUROLOGY CONSULTATION NOTE  Amanda Wilkins MRN: [id number] DOB: 1946/07/25  Referring provider: Dr. Lew Dawes Primary care provider: Dr. Lew Dawes  Reason for consult:  Vision changes  Dear Dr Alain Marion:  Thank you for your kind referral of Amanda Wilkins for consultation of the above symptoms. Although her history is well known to you, please allow me to reiterate it for the purpose of our medical record.Records and images were personally reviewed where available.  HISTORY OF PRESENT ILLNESS: This is a pleasant 69 year old right-handed woman with a history of atrial fibrillation, hypertension, migraines in the 1970s, in her usual state of health until 7-8 weeks ago when she started having vision changes in her left eye. She describes all of a sudden she started seeing 2 or 3 very dark vertical lines on the lateral corner of her left eye, pulsating when she looked to the left. This changed to a light gray line and circles moving all over the place, just seen on the lateral half of her left eye. Symptoms were pretty constant, she saw Dr. Maylene Roes and was diagnosed with ocular migraine. She then saw retinal specialist Dr. Zadie Rhine 10 days ago with overall unremarkable eye exam except for age-related nuclear cataracts bilaterally, diagnosis of ocular migraine. It was unusual that symptoms had persisted for 5 weeks, and neurological evaluation was recommended. She denies any eye pain, no associated nausea, vomiting, photo or phonophobia. At first she was wondering if she had a dull headache in the right frontal region. She does have a remote history of migraines, and since starting Elavil in 1984 for depression, migraines resolved. She reports migraines in the early 1970s were "totally crazy, everything moving around," with associated photophobia followed by a headache. She denies any recent head injuries, infections. Her paternal grandmother may have had migraines. She denies any  dizziness, focal numbness/tingling/weakness, bowel/bladder dysfunction. She woke up 3 months ago with pain in her hands and feet, saw Rheumatology and was started on prednisone, reporting she was still taking prednisone when eye symptoms started. She has a history of sepsis last April due to cellulitis/cell wall induration, found to be in atrial fibrillation. She reports alopecia 4-5 years ago, but worsened hair loss after the sepsis, adding that she started having different medical conditions after the bout of sepsis.   Laboratory Data: Lab Results  Component Value Date   WBC 6.3 05/27/2015   HGB 13.4 05/27/2015   HCT 39.7 05/27/2015   MCV 96.1 05/27/2015   PLT 170.0 05/27/2015       Chemistry      Component Value Date/Time   NA 136 05/27/2015 1705   K 4.3 05/27/2015 1705   CL 101 05/27/2015 1705   CO2 26 05/27/2015 1705   BUN 19 05/27/2015 1705   CREATININE 1.23* 05/27/2015 1705   CREATININE 0.68 05/27/2014 0923      Component Value Date/Time   CALCIUM 9.9 05/27/2015 1705   ALKPHOS 75 05/27/2015 1705   AST 22 05/27/2015 1705   ALT 18 05/27/2015 1705   BILITOT 0.5 05/27/2015 1705       PAST MEDICAL HISTORY: Past Medical History  Diagnosis Date  . Allergy   . Atrial fibrillation ([place])   . Migraines     on Zoloft for migraines  . Hypertension   . Chronic interstitial nephritis   . Depression   . Palpitations   . Obesity   . Arthritis     ON BOTH KNEES    PAST SURGICAL HISTORY:  Past Surgical History  Procedure Laterality Date  . Cesarean section      x 1  . Dental surgery      multiple implants  . Repair of torn meniscus on left 08/2014 Left 08/2015    Dr. Ronnie Derby [facility]  . Esophagogastroduodenoscopy N/A 02/08/2015    Procedure: ESOPHAGOGASTRODUODENOSCOPY (EGD);  Surgeon: [name], MD;  Location: [facility];  Service: Endoscopy;  Laterality: N/A;    MEDICATIONS: Current Outpatient Prescriptions on File Prior to Visit  Medication Sig Dispense  Refill  . atenolol (TENORMIN) 50 MG tablet Take 1 tablet (50 mg total) by mouth daily. 90 tablet 3  . Cholecalciferol (VITAMIN D) 2000 UNITS tablet Take 2,000 Units by mouth daily.    . hydroxychloroquine (PLAQUENIL) 200 MG tablet Take 200 mg by mouth daily. TAKE 2 TABLETS BY MOUTH ONCE DAILY  3  . Multiple Vitamin (MULTIVITAMIN) capsule Take 1 capsule by mouth daily.    . sertraline (ZOLOFT) 50 MG tablet TAKE 1 TABLET BY MOUTH DAILY 90 tablet 0  . warfarin (COUMADIN) 5 MG tablet Take as directed by anticoagulation clinic 40 tablet 3  . ALPRAZolam (XANAX) 0.25 MG tablet Take 1 tablet (0.25 mg total) by mouth 2 (two) times daily as needed for anxiety. (Patient not taking: Reported on 07/22/2015) 60 tablet 5   No current facility-administered medications on file prior to visit.    ALLERGIES: Allergies  Allergen Reactions  . Epinephrine Base Other (See Comments)    Seriously increases heart rate  . Aspirin Other (See Comments)    Can take the coated 325 mg. Plain asa 325 mg speeds up the heart.  . Penicillins Other (See Comments)    Pt previously has been told not to take because of family history of reactions (water blisters). She took amoxicillin in 2012-2013 with no reaction  . Tape Other (See Comments)    Blisters from adhesive tape    FAMILY HISTORY: Family History  Problem Relation Age of Onset  . Pancreatic cancer Brother   . Lymphoma Mother   . Prostate cancer Father     metastatic prostate cancer    SOCIAL HISTORY: Social History   Social History  . Marital Status: Married    Spouse Name: N/A  . Number of Children: 2  . Years of Education: N/A   Occupational History  . retired   . Part-time (with temp agency)    Social History Main Topics  . Smoking status: Never Smoker   . Smokeless tobacco: Never Used     Comment: H/O social smoking at times when drinking--never a "habit"  . Alcohol Use: 0.0 oz/week    0 Standard drinks or equivalent per week     Comment:  WINE- [employer]  . Drug Use: No  . Sexual Activity: Yes     Comment: intercourse age 66, sexual partners more than 5   Other Topics Concern  . Not on file   Social History Narrative    REVIEW OF SYSTEMS: Constitutional: No fevers, chills, or sweats, no generalized fatigue, change in appetite Eyes: as above Ear, nose and throat: No hearing loss, ear pain, nasal congestion, sore throat Cardiovascular: No chest pain, palpitations Respiratory:  No shortness of breath at rest or with exertion, wheezes GastrointestinaI: No nausea, vomiting, diarrhea, abdominal pain, fecal incontinence Genitourinary:  No dysuria, urinary retention or frequency Musculoskeletal:  No neck pain, back pain Integumentary: No rash, pruritus, skin lesions Neurological: as above Psychiatric: No depression, insomnia,  anxiety Endocrine: No palpitations, fatigue, diaphoresis, mood swings, change in appetite, change in weight, increased thirst Hematologic/Lymphatic:  No anemia, purpura, petechiae. Allergic/Immunologic: no itchy/runny eyes, nasal congestion, recent allergic reactions, rashes  PHYSICAL EXAM: Filed Vitals:   07/22/15 0930  BP: 120/72  Pulse: 84  Resp: 16   General: No acute distress Head:  Normocephalic/atraumatic Eyes: Fundoscopic exam shows bilateral sharp discs, no vessel changes, exudates, or hemorrhages Neck: supple, no paraspinal tenderness, full range of motion Back: No paraspinal tenderness Heart: regular rate and rhythm Lungs: Clear to auscultation bilaterally. Vascular: No carotid bruits. Skin/Extremities: No rash, no edema Neurological Exam: Mental status: alert and oriented to person, place, and time, no dysarthria or aphasia, Fund of knowledge is appropriate.  Recent and remote memory are intact.  Attention and concentration are normal.    Able to name objects and repeat phrases. Cranial nerves: CN I: not tested CN II: pupils equal, round and reactive to light, visual  fields intact, fundi unremarkable. She reports seeing gray lines on the left outer hemifield, occurring intermittently, more when she looks to the left CN III, IV, VI:  full range of motion, no nystagmus, no ptosis CN V: facial sensation intact CN VII: upper and lower face symmetric CN VIII: hearing intact to finger rub CN IX, X: gag intact, uvula midline CN XI: sternocleidomastoid and trapezius muscles intact CN XII: tongue midline Bulk & Tone: normal, no fasciculations. Motor: 5/5 throughout with no pronator drift. Sensation: intact to light touch, cold, pin, vibration and joint position sense.  No extinction to double simultaneous stimulation.  Romberg test negative Deep Tendon Reflexes: +2 throughout, no ankle clonus Plantar responses: downgoing bilaterally Cerebellar: no incoordination on finger to nose, heel to shin. No dysdiadochokinesia Gait: narrow-based and steady, able to tandem walk adequately. Tremor: none  IMPRESSION: This is a pleasant 69 year old right-handed woman with a history of atrial fibrillation, hypertension, remote migraines, presenting with a 7-8 week history of left vision changes. Ophthalmological evaluation unremarkable, concern for neurological cause. Her neurological exam is non-focal. Consideration is for acephalgic migraine or ocular migraine, however the prolonged duration is atypical. A small stroke is possible, however eye exam usually can diagnose retinal vein/artery occlusion. MRI brain with and without contrast will be ordered to assess for underlying structural abnormality. Check carotid dopplers and lipid panel. Temporal arteritis unlikely, check ESR, CRP. Continue current medications, she is on Coumadin for anticoagulation. She knows to go to the ER if symptoms change or progress and will follow-up after the tests.   Thank you for allowing me to participate in the care of this patient. Please do not hesitate to call for any questions or  concerns.   Ellouise Newer, M.D.  CC: Dr. Alain Marion

## 2015-07-25 ENCOUNTER — Other Ambulatory Visit: Payer: Self-pay | Admitting: Internal Medicine

## 2015-07-25 ENCOUNTER — Other Ambulatory Visit (INDEPENDENT_AMBULATORY_CARE_PROVIDER_SITE_OTHER): Payer: Medicare Other

## 2015-07-25 DIAGNOSIS — H5310 Unspecified subjective visual disturbances: Secondary | ICD-10-CM | POA: Diagnosis not present

## 2015-07-25 DIAGNOSIS — E785 Hyperlipidemia, unspecified: Secondary | ICD-10-CM | POA: Diagnosis not present

## 2015-07-25 DIAGNOSIS — Z8669 Personal history of other diseases of the nervous system and sense organs: Secondary | ICD-10-CM | POA: Insufficient documentation

## 2015-07-25 LAB — LIPID PANEL
CHOLESTEROL: 211 mg/dL — AB (ref 0–200)
HDL: 83.6 mg/dL (ref >39.00)
LDL Cholesterol: 103 mg/dL — ABNORMAL HIGH (ref 0–99)
NonHDL: 127.38
TRIGLYCERIDES: 124 mg/dL (ref 0.0–149.0)
Total CHOL/HDL Ratio: 3
VLDL: 24.8 mg/dL (ref 0.0–40.0)

## 2015-07-25 LAB — SEDIMENTATION RATE: SED RATE: 28 mm/h — AB (ref 0–22)

## 2015-07-25 LAB — C-REACTIVE PROTEIN: CRP: 0.4 mg/dL — AB (ref 0.5–20.0)

## 2015-07-26 ENCOUNTER — Other Ambulatory Visit: Payer: Self-pay | Admitting: Gynecology

## 2015-07-26 DIAGNOSIS — H538 Other visual disturbances: Secondary | ICD-10-CM | POA: Diagnosis not present

## 2015-07-26 DIAGNOSIS — G9389 Other specified disorders of brain: Secondary | ICD-10-CM | POA: Diagnosis not present

## 2015-07-26 NOTE — Telephone Encounter (Signed)
Rx last filled on 04/25/15

## 2015-07-27 ENCOUNTER — Ambulatory Visit (HOSPITAL_COMMUNITY)
Admission: RE | Admit: 2015-07-27 | Discharge: 2015-07-27 | Disposition: A | Discharge status: Home / Self Care | Payer: Medicare Other | Source: Ambulatory Visit | Attending: Neurology | Admitting: Neurology

## 2015-07-27 DIAGNOSIS — I6523 Occlusion and stenosis of bilateral carotid arteries: Secondary | ICD-10-CM | POA: Insufficient documentation

## 2015-07-27 DIAGNOSIS — H5310 Unspecified subjective visual disturbances: Secondary | ICD-10-CM | POA: Diagnosis not present

## 2015-07-27 DIAGNOSIS — I10 Essential (primary) hypertension: Secondary | ICD-10-CM | POA: Diagnosis not present

## 2015-07-27 DIAGNOSIS — E785 Hyperlipidemia, unspecified: Secondary | ICD-10-CM | POA: Insufficient documentation

## 2015-07-28 ENCOUNTER — Telehealth: Payer: Self-pay | Admitting: Family Medicine

## 2015-07-28 NOTE — Telephone Encounter (Signed)
-----   Message from Cameron Sprang, MD sent at 07/27/2015  8:40 AM EDT ----- Pls let her know bloodwork for inflammation is unremarkable, her cholesterol level is still elevated. If carotid ultrasound shows plaque, would recommend working with her PCP on cholesterol management. Thanks

## 2015-07-28 NOTE — Telephone Encounter (Signed)
Patient was notified of results and advisement.

## 2015-07-29 ENCOUNTER — Telehealth: Payer: Self-pay | Admitting: Family Medicine

## 2015-07-29 NOTE — Telephone Encounter (Signed)
-----   Message from Cameron Sprang, MD sent at 07/28/2015  3:52 PM EDT ----- Pls let her know carotid dopplers did not show any significant obstruction, however there was mild plaque buildup, would recommend she start medication for cholesterol with her PCP, since cholesterol level was elevated. Thanks

## 2015-07-29 NOTE — Telephone Encounter (Signed)
Lmovm to rtn my call. 

## 2015-07-29 NOTE — Telephone Encounter (Signed)
Patient was notified of result & advisement. 

## 2015-07-29 NOTE — Telephone Encounter (Signed)
-----   Message from Cameron Sprang, MD sent at 07/29/2015  3:55 PM EDT ----- Regarding: MRI results Pls let her know I reviewed MRI brain and there is no evidence of new stroke, tumor, or bleed. Thanks

## 2015-08-02 ENCOUNTER — Encounter: Payer: Self-pay | Admitting: Family

## 2015-08-02 ENCOUNTER — Ambulatory Visit: Payer: Self-pay

## 2015-08-02 ENCOUNTER — Ambulatory Visit (INDEPENDENT_AMBULATORY_CARE_PROVIDER_SITE_OTHER): Payer: Medicare Other | Admitting: Family

## 2015-08-02 VITALS — BP 128/74 | HR 72 | Temp 97.9°F | Resp 18 | Ht 66.0 in | Wt 223.0 lb

## 2015-08-02 DIAGNOSIS — I10 Essential (primary) hypertension: Secondary | ICD-10-CM | POA: Diagnosis not present

## 2015-08-02 NOTE — Assessment & Plan Note (Addendum)
Hypertension is well controlled with current regimen. No evidence of facial swelling, redness or other signs of infection.  Continue current dosage of atenolol. Monitor blood pressure at home and follow up if symptoms return.

## 2015-08-02 NOTE — Patient Instructions (Signed)
Thank you for choosing [facility].  Summary/Instructions:  Please continue to take your medications as prescribed.   If your symptoms worsen or fail to improve, please contact our office for further instruction, or in case of emergency go directly to the emergency room at the closest medical facility.     

## 2015-08-02 NOTE — Progress Notes (Signed)
Pre visit review using our clinic review tool, if applicable. No additional management support is needed unless otherwise documented below in the visit note. 

## 2015-08-02 NOTE — Telephone Encounter (Signed)
Patient was notified of result. 

## 2015-08-02 NOTE — Progress Notes (Signed)
Subjective:    Patient ID: Amanda Wilkins, female    DOB: [birth year], 69 y.o.   MRN: [id number]  Chief Complaint  Patient presents with  . Blood Pressure Check    feels like her bp has been high, states that her face was really bright red yesterday and she hasn't felt right, does not complain of any headaches    HPI:  Amanda Wilkins is a 69 y.o. female who  has a past medical history of Allergy; Atrial fibrillation ([place]); Migraines; Hypertension; Chronic interstitial nephritis; Depression; Palpitations; Obesity; and Arthritis. and presents today for an acute office visit.   This is a new problem. Associated symptoms of facial redness and warmth had been going on for about 1 day and believed to be elevated blood pressure which has since resolved today. Blood pressure is currently maintained with atenolol. Takes the medication as prescribed and denies adverse side effects or hypotensive events. Denies symptoms of end organ damage.   BP Readings from Last 3 Encounters:  08/02/15 128/74  07/22/15 120/72  07/18/15 130/84    Allergies  Allergen Reactions  . Epinephrine Base Other (See Comments)    Seriously increases heart rate  . Aspirin Other (See Comments)    Can take the coated 325 mg. Plain asa 325 mg speeds up the heart.  . Penicillins Other (See Comments)    Pt previously has been told not to take because of family history of reactions (water blisters). She took amoxicillin in 2012-2013 with no reaction  . Tape Other (See Comments)    Blisters from adhesive tape     Current Outpatient Prescriptions on File Prior to Visit  Medication Sig Dispense Refill  . ALPRAZolam (XANAX) 0.25 MG tablet Take 1 tablet (0.25 mg total) by mouth 2 (two) times daily as needed for anxiety. 60 tablet 5  . atenolol (TENORMIN) 50 MG tablet Take 1 tablet (50 mg total) by mouth daily. 90 tablet 3  . Cholecalciferol (VITAMIN D) 2000 UNITS tablet Take 2,000 Units by mouth daily.    .  hydroxychloroquine (PLAQUENIL) 200 MG tablet Take 200 mg by mouth daily. TAKE 2 TABLETS BY MOUTH ONCE DAILY  3  . Multiple Vitamin (MULTIVITAMIN) capsule Take 1 capsule by mouth daily.    . sertraline (ZOLOFT) 50 MG tablet TAKE 1 TABLET BY MOUTH DAILY 90 tablet 2  . traMADol-acetaminophen (ULTRACET) 37.5-325 MG tablet Takes 1 tablet daily  3  . warfarin (COUMADIN) 5 MG tablet USE AS DIRECTED BY ANTICOAGULATION CLINIC 40 tablet 0   No current facility-administered medications on file prior to visit.     Review of Systems  Constitutional: Negative for fever and chills.  Eyes:       Negative for changes in vision  Respiratory: Negative for chest tightness and shortness of breath.   Cardiovascular: Negative for chest pain, palpitations and leg swelling.      Objective:    BP 128/74 mmHg  Pulse 72  Temp(Src) 97.9 F (36.6 C) (Oral)  Resp 18  Ht 5\' 6"  (1.676 m)  Wt 223 lb (101.152 kg)  BMI 36.01 kg/m2  SpO2 97% Nursing note and vital signs reviewed.  Physical Exam  Constitutional: She is oriented to person, place, and time. She appears well-developed and well-nourished. No distress.  HENT:  Right Ear: Hearing, tympanic membrane, external ear and ear canal normal.  Left Ear: Hearing, tympanic membrane, external ear and ear canal normal.  Nose: Nose normal. Right sinus exhibits no maxillary sinus tenderness and no  frontal sinus tenderness. Left sinus exhibits no maxillary sinus tenderness and no frontal sinus tenderness.  Mouth/Throat: Uvula is midline, oropharynx is clear and moist and mucous membranes are normal.  Cardiovascular: Normal rate, regular rhythm, normal heart sounds and intact distal pulses.   Pulmonary/Chest: Effort normal and breath sounds normal.  Neurological: She is alert and oriented to person, place, and time.  Skin: Skin is warm and dry.  Psychiatric: She has a normal mood and affect. Her behavior is normal. Judgment and thought content normal.         Assessment & Plan:   Problem List Items Addressed This Visit      Cardiovascular and Mediastinum   Essential hypertension - Primary    Hypertension is well controlled with current regimen. No evidence of facial swelling, redness or other signs of infection.  Continue current dosage of atenolol. Monitor blood pressure at home and follow up if symptoms return.

## 2015-08-04 ENCOUNTER — Ambulatory Visit (INDEPENDENT_AMBULATORY_CARE_PROVIDER_SITE_OTHER): Payer: Medicare Other | Admitting: General Practice

## 2015-08-04 ENCOUNTER — Ambulatory Visit (INDEPENDENT_AMBULATORY_CARE_PROVIDER_SITE_OTHER): Payer: Medicare Other | Admitting: Neurology

## 2015-08-04 ENCOUNTER — Encounter: Payer: Self-pay | Admitting: Neurology

## 2015-08-04 ENCOUNTER — Telehealth: Payer: Self-pay | Admitting: Internal Medicine

## 2015-08-04 VITALS — BP 138/90 | HR 85 | Resp 16 | Wt 224.0 lb

## 2015-08-04 DIAGNOSIS — Z5181 Encounter for therapeutic drug level monitoring: Secondary | ICD-10-CM | POA: Diagnosis not present

## 2015-08-04 DIAGNOSIS — G43809 Other migraine, not intractable, without status migrainosus: Secondary | ICD-10-CM

## 2015-08-04 DIAGNOSIS — I4891 Unspecified atrial fibrillation: Secondary | ICD-10-CM | POA: Diagnosis not present

## 2015-08-04 DIAGNOSIS — G43109 Migraine with aura, not intractable, without status migrainosus: Secondary | ICD-10-CM

## 2015-08-04 LAB — POCT INR: INR: 2.1

## 2015-08-04 MED ORDER — SERTRALINE HCL 50 MG PO TABS
ORAL_TABLET | ORAL | Status: DC
Start: 2015-08-04 — End: 2016-11-14

## 2015-08-04 NOTE — Telephone Encounter (Signed)
Pt has dx of chronic interstitial nephritis, so should avoid NSAIDs altogether.  This leaves acetaminophen or tramadol, both of which make up ultracet.  If that doesn't help, only know to try topical agents such as Icy Hot or capsacian cream.  Could also consider glucosamine if no shellfish allergy

## 2015-08-04 NOTE — Telephone Encounter (Signed)
Agree with glucosamine trial

## 2015-08-04 NOTE — Progress Notes (Signed)
NEUROLOGY FOLLOW UP OFFICE NOTE  Amanda Wilkins [id number]  HISTORY OF PRESENT ILLNESS: I had the pleasure of seeing Amanda Wilkins in follow-up in the neurology clinic on 08/04/2015.  The patient was last seen 2 weeks ago for left vision changes. Records and images were personally reviewed where available. Her ESR was 28, CRP 0.4, overall unremarkable. Lipid panel was elevated with total cholesterol of 211, LDL 103. I personally reviewed MRI brain with and without contrast which did not show any acute changes. Her carotid doppler showed 1-39% bilateral carotid stenosis with heterogeneous plaque bilaterally, no significant stenosis. She denies any symptoms today, but reports continued intermittent lines in her left eye. She denies any headaches. No diplopia, dysarthria/dysphagia, focal numbness/tingling/weakness.  HPI: This is a pleasant 69 yo RH woman with a history of atrial fibrillation, hypertension, migraines in the 1970s, in her usual state of health until August/September 2016 when she started having vision changes in her left eye. She describes all of a sudden she started seeing 2 or 3 very dark vertical lines on the lateral corner of her left eye, pulsating when she looked to the left. This changed to a light gray line and circles moving all over the place, just seen on the lateral half of her left eye. Symptoms were pretty constant, she saw Dr. Maylene Roes and was diagnosed with ocular migraine. She then saw retinal specialist Dr. Zadie Rhine with overall unremarkable eye exam except for age-related nuclear cataracts bilaterally, diagnosis of ocular migraine. It was unusual that symptoms had persisted for 5 weeks, and neurological evaluation was recommended. She denies any eye pain, no associated nausea, vomiting, photo or phonophobia. At first she was wondering if she had a dull headache in the right frontal region. She does have a remote history of migraines, and since starting Elavil in 1984  for depression, migraines resolved. She reports migraines in the early 1970s were "totally crazy, everything moving around," with associated photophobia followed by a headache. She denies any recent head injuries, infections. Her paternal grandmother may have had migraines. She denies any dizziness, focal numbness/tingling/weakness, bowel/bladder dysfunction. She woke up 3 months ago with pain in her hands and feet, saw Rheumatology and was started on prednisone, reporting she was still taking prednisone when eye symptoms started. She has a history of sepsis last April due to cellulitis/cell wall induration, found to be in atrial fibrillation. She reports alopecia 4-5 years ago, but worsened hair loss after the sepsis, adding that she started having different medical conditions after the bout of sepsis.   PAST MEDICAL HISTORY: Past Medical History  Diagnosis Date  . Allergy   . Atrial fibrillation ([place])   . Migraines     on Zoloft for migraines  . Hypertension   . Chronic interstitial nephritis   . Depression   . Palpitations   . Obesity   . Arthritis     ON BOTH KNEES    MEDICATIONS: Current Outpatient Prescriptions on File Prior to Visit  Medication Sig Dispense Refill  . ALPRAZolam (XANAX) 0.25 MG tablet Take 1 tablet (0.25 mg total) by mouth 2 (two) times daily as needed for anxiety. 60 tablet 5  . atenolol (TENORMIN) 50 MG tablet Take 1 tablet (50 mg total) by mouth daily. 90 tablet 3  . Cholecalciferol (VITAMIN D) 2000 UNITS tablet Take 2,000 Units by mouth daily.    . hydroxychloroquine (PLAQUENIL) 200 MG tablet Take 200 mg by mouth daily. TAKE 2 TABLETS BY MOUTH ONCE DAILY  3  .  Multiple Vitamin (MULTIVITAMIN) capsule Take 1 capsule by mouth daily.    . sertraline (ZOLOFT) 50 MG tablet TAKE 1 TABLET BY MOUTH DAILY 90 tablet 2  . traMADol-acetaminophen (ULTRACET) 37.5-325 MG tablet Takes 1 tablet daily  3  . warfarin (COUMADIN) 5 MG tablet USE AS DIRECTED BY ANTICOAGULATION CLINIC  40 tablet 0   No current facility-administered medications on file prior to visit.    ALLERGIES: Allergies  Allergen Reactions  . Epinephrine Base Other (See Comments)    Seriously increases heart rate  . Aspirin Other (See Comments)    Can take the coated 325 mg. Plain asa 325 mg speeds up the heart.  . Penicillins Other (See Comments)    Pt previously has been told not to take because of family history of reactions (water blisters). She took amoxicillin in 2012-2013 with no reaction  . Tape Other (See Comments)    Blisters from adhesive tape    FAMILY HISTORY: Family History  Problem Relation Age of Onset  . Pancreatic cancer Brother   . Lymphoma Mother   . Prostate cancer Father     metastatic prostate cancer    SOCIAL HISTORY: Social History   Social History  . Marital Status: Married    Spouse Name: N/A  . Number of Children: 2  . Years of Education: N/A   Occupational History  . retired   . Part-time (with temp agency)    Social History Main Topics  . Smoking status: Never Smoker   . Smokeless tobacco: Never Used     Comment: H/O social smoking at times when drinking--never a "habit"  . Alcohol Use: 0.0 oz/week    0 Standard drinks or equivalent per week     Comment: WINE- [employer] GLASSES AT [employer]  . Drug Use: No  . Sexual Activity: Yes     Comment: intercourse age 13, sexual partners more than 5   Other Topics Concern  . Not on file   Social History Narrative    REVIEW OF SYSTEMS: Constitutional: No fevers, chills, or sweats, no generalized fatigue, change in appetite Eyes: No visual changes, double vision, eye pain Ear, nose and throat: No hearing loss, ear pain, nasal congestion, sore throat Cardiovascular: No chest pain, palpitations Respiratory:  No shortness of breath at rest or with exertion, wheezes GastrointestinaI: No nausea, vomiting, diarrhea, abdominal pain, fecal incontinence Genitourinary:  No dysuria, urinary retention or  frequency Musculoskeletal:  No neck pain, back pain Integumentary: No rash, pruritus, skin lesions Neurological: as above Psychiatric: No depression, insomnia, anxiety Endocrine: No palpitations, fatigue, diaphoresis, mood swings, change in appetite, change in weight, increased thirst Hematologic/Lymphatic:  No anemia, purpura, petechiae. Allergic/Immunologic: no itchy/runny eyes, nasal congestion, recent allergic reactions, rashes  PHYSICAL EXAM: Filed Vitals:   08/04/15 1141  BP: 138/90  Pulse: 85  Resp: 16   General: No acute distress Head:  Normocephalic/atraumatic Neck: supple, no paraspinal tenderness, full range of motion Heart:  Regular rate and rhythm Lungs:  Clear to auscultation bilaterally Back: No paraspinal tenderness Skin/Extremities: No rash, no edema Neurological Exam: alert and oriented to person, place, and time. No aphasia or dysarthria. Fund of knowledge is appropriate.  Recent and remote memory are intact.  Attention and concentration are normal.    Able to name objects and repeat phrases. Cranial nerves: Pupils equal, round, reactive to light.  Fundoscopic exam unremarkable, no papilledema. Extraocular movements intact with no nystagmus. No visual changes on exam today. Visual fields full. Facial sensation intact. No  facial asymmetry. Tongue, uvula, palate midline.  Motor: Bulk and tone normal, muscle strength 5/5 throughout with no pronator drift.  Sensation to light touch intact.  No extinction to double simultaneous stimulation.  Deep tendon reflexes 2+ throughout, toes downgoing.  Finger to nose testing intact.  Gait narrow-based and steady, able to tandem walk adequately.  Romberg negative.  IMPRESSION: This is a pleasant 69 yo RH woman with a history of atrial fibrillation, hypertension, remote migraines, new onset left vision changes. Ophthalmological evaluation unremarkable, concern for neurological cause. Her neurological exam is non-focal. MRI brain  unremarkable. Carotid dopplers showed heterogeneous plaque but no significant stenosis. Consideration is for acephalgic migraine or ocular migraine, however the prolonged duration is atypical. We discussed treating this as a migraine, options for migraine prophylaxis were reviewed. She is already on Zoloft, which can potentially help with migraines, and will be increased to 168m/day. Historically, her migraines in the past had good response to Elavil, this may be considered if symptoms persist. We also discussed elevated lipid panel with plaque seen on carotid dopplers, she will discuss lipid-lowering treatment with her PCP. She will follow-up in 3 months and knows to call our office for any changes.   Thank you for allowing me to participate in her care.  Please do not hesitate to call for any questions or concerns.  The duration of this appointment visit was 24 minutes of face-to-face time with the patient.  Greater than 50% of this time was spent in counseling, explanation of diagnosis, planning of further management, and coordination of care.   KEllouise Newer M.D.   CC: Dr. PAlain Marion

## 2015-08-04 NOTE — Progress Notes (Signed)
Pre visit review using our clinic review tool, if applicable. No additional management support is needed unless otherwise documented below in the visit note. 

## 2015-08-04 NOTE — Telephone Encounter (Signed)
Spoke with pt. She is calling to see what pain medication she can take for her arthritis.  Pt reports Dr. [name] was going to look into this for her.  Pt reports she is having a great deal of pain in left knee due to arthritis.  Was prescribed Ultracet by primary care but this has not helped.   I told pt I would review with pharmacist in coumadin clinic for recommendations.

## 2015-08-04 NOTE — Telephone Encounter (Signed)
I spoke with pt and gave her information from the pharmacist. She has tried the creams in the past with little relief of arthritis pain.

## 2015-08-04 NOTE — Patient Instructions (Signed)
1. Increase Zoloft 50mg : Take 2 tablets daily 2. Discuss cholesterol with your PCP 3. Follow-up in 3 months, call for any changes

## 2015-08-04 NOTE — Telephone Encounter (Signed)
New message     What can pt take for her arthritis and she is on coumadin?  Over the counter meds do not help.  Dr [name] was going to consult with someone and call her back, but she has not received a call as of today.

## 2015-08-08 ENCOUNTER — Encounter: Payer: Self-pay | Admitting: Neurology

## 2015-08-08 DIAGNOSIS — G43109 Migraine with aura, not intractable, without status migrainosus: Secondary | ICD-10-CM | POA: Insufficient documentation

## 2015-08-09 DIAGNOSIS — M179 Osteoarthritis of knee, unspecified: Secondary | ICD-10-CM | POA: Insufficient documentation

## 2015-08-09 DIAGNOSIS — M1712 Unilateral primary osteoarthritis, left knee: Secondary | ICD-10-CM | POA: Insufficient documentation

## 2015-08-09 DIAGNOSIS — M2342 Loose body in knee, left knee: Secondary | ICD-10-CM | POA: Diagnosis not present

## 2015-08-30 ENCOUNTER — Ambulatory Visit (INDEPENDENT_AMBULATORY_CARE_PROVIDER_SITE_OTHER): Payer: Medicare Other | Admitting: General Practice

## 2015-08-30 ENCOUNTER — Encounter: Payer: Self-pay | Admitting: Internal Medicine

## 2015-08-30 ENCOUNTER — Ambulatory Visit (INDEPENDENT_AMBULATORY_CARE_PROVIDER_SITE_OTHER): Payer: Medicare Other | Admitting: Internal Medicine

## 2015-08-30 VITALS — BP 120/80 | HR 83 | Wt 230.0 lb

## 2015-08-30 DIAGNOSIS — M06072 Rheumatoid arthritis without rheumatoid factor, left ankle and foot: Secondary | ICD-10-CM

## 2015-08-30 DIAGNOSIS — I4891 Unspecified atrial fibrillation: Secondary | ICD-10-CM | POA: Diagnosis not present

## 2015-08-30 DIAGNOSIS — E785 Hyperlipidemia, unspecified: Secondary | ICD-10-CM | POA: Insufficient documentation

## 2015-08-30 DIAGNOSIS — I480 Paroxysmal atrial fibrillation: Secondary | ICD-10-CM

## 2015-08-30 DIAGNOSIS — M06071 Rheumatoid arthritis without rheumatoid factor, right ankle and foot: Secondary | ICD-10-CM

## 2015-08-30 DIAGNOSIS — Z5181 Encounter for therapeutic drug level monitoring: Secondary | ICD-10-CM

## 2015-08-30 DIAGNOSIS — M791 Myalgia, unspecified site: Secondary | ICD-10-CM

## 2015-08-30 DIAGNOSIS — M069 Rheumatoid arthritis, unspecified: Secondary | ICD-10-CM | POA: Insufficient documentation

## 2015-08-30 LAB — POCT INR: INR: 1.2

## 2015-08-30 MED ORDER — ROSUVASTATIN CALCIUM 5 MG PO TABS: 5.0000 mg | ORAL_TABLET | Freq: Every day | ORAL | Status: DC

## 2015-08-30 NOTE — Assessment & Plan Note (Signed)
H/o PAF w/RVR Dr [name] Bisoprolol, Cardizem 5/16 - Coumadin  Pt is in NSR now

## 2015-08-30 NOTE — Assessment & Plan Note (Signed)
2016 Dr Trudie Reed RF(-) On Plaquenil  Better

## 2015-08-30 NOTE — Progress Notes (Signed)
I have reviewed and agree with the plan. 

## 2015-08-30 NOTE — Progress Notes (Signed)
Subjective:  Patient ID: Amanda Wilkins, female    DOB: 17-Nov-1945  Age: 69 y.o. MRN: [id number]  CC: No chief complaint on file.   HPI Amanda Wilkins presents for arthralgia (Dr Fredna Dow), RA (seonegative), anxiety f/u  Outpatient Prescriptions Prior to Visit  Medication Sig Dispense Refill  . ALPRAZolam (XANAX) 0.25 MG tablet Take 1 tablet (0.25 mg total) by mouth 2 (two) times daily as needed for anxiety. 60 tablet 5  . atenolol (TENORMIN) 50 MG tablet Take 1 tablet (50 mg total) by mouth daily. 90 tablet 3  . Cholecalciferol (VITAMIN D) 2000 UNITS tablet Take 2,000 Units by mouth daily.    . hydroxychloroquine (PLAQUENIL) 200 MG tablet Take 200 mg by mouth daily. TAKE 2 TABLETS BY MOUTH ONCE DAILY  3  . Multiple Vitamin (MULTIVITAMIN) capsule Take 1 capsule by mouth daily.    . sertraline (ZOLOFT) 50 MG tablet Take 2 tablets daily 180 tablet 3  . traMADol-acetaminophen (ULTRACET) 37.5-325 MG tablet Takes 1 tablet daily  3  . warfarin (COUMADIN) 5 MG tablet USE AS DIRECTED BY ANTICOAGULATION CLINIC 40 tablet 0   No facility-administered medications prior to visit.    ROS Review of Systems  Constitutional: Negative for chills, activity change, appetite change, fatigue and unexpected weight change.  HENT: Negative for congestion, mouth sores and sinus pressure.   Eyes: Positive for visual disturbance.  Respiratory: Negative for cough and chest tightness.   Gastrointestinal: Negative for nausea and abdominal pain.  Genitourinary: Negative for frequency, difficulty urinating and vaginal pain.  Musculoskeletal: Positive for back pain and arthralgias. Negative for gait problem.  Skin: Negative for pallor and rash.  Neurological: Negative for dizziness, tremors, weakness, numbness and headaches.  Psychiatric/Behavioral: Negative for suicidal ideas, confusion, sleep disturbance and dysphoric mood. The patient is not nervous/anxious.     Objective:  BP 120/80 mmHg  Pulse 83   Wt 230 lb (104.327 kg)  SpO2 96%  BP Readings from Last 3 Encounters:  08/30/15 120/80  08/04/15 138/90  08/02/15 128/74    Wt Readings from Last 3 Encounters:  08/30/15 230 lb (104.327 kg)  08/04/15 224 lb (101.606 kg)  08/02/15 223 lb (101.152 kg)    Physical Exam  Constitutional: She appears well-developed. No distress.  HENT:  Head: Normocephalic.  Right Ear: External ear normal.  Left Ear: External ear normal.  Nose: Nose normal.  Mouth/Throat: Oropharynx is clear and moist.  Eyes: Conjunctivae are normal. Pupils are equal, round, and reactive to light. Right eye exhibits no discharge. Left eye exhibits no discharge.  Neck: Normal range of motion. Neck supple. No JVD present. No tracheal deviation present. No thyromegaly present.  Cardiovascular: Normal rate, regular rhythm and normal heart sounds.   Pulmonary/Chest: No stridor. No respiratory distress. She has no wheezes.  Abdominal: Soft. Bowel sounds are normal. She exhibits no distension and no mass. There is no tenderness. There is no rebound and no guarding.  Musculoskeletal: She exhibits no edema or tenderness.  Lymphadenopathy:    She has no cervical adenopathy.  Neurological: She displays normal reflexes. No cranial nerve deficit. She exhibits normal muscle tone. Coordination normal.  Skin: No rash noted. No erythema.  Psychiatric: She has a normal mood and affect. Her behavior is normal. Judgment and thought content normal.    Lab Results  Component Value Date   WBC 6.3 05/27/2015   HGB 13.4 05/27/2015   HCT 39.7 05/27/2015   PLT 170.0 05/27/2015   GLUCOSE 99 05/27/2015   CHOL  211* 07/25/2015   TRIG 124.0 07/25/2015   HDL 83.60 07/25/2015   LDLCALC 103* 07/25/2015   ALT 18 05/27/2015   AST 22 05/27/2015   NA 136 05/27/2015   K 4.3 05/27/2015   CL 101 05/27/2015   CREATININE 1.23* 05/27/2015   BUN 19 05/27/2015   CO2 26 05/27/2015   TSH 2.70 03/15/2015   INR 2.1 08/04/2015   HGBA1C 5.8*  01/30/2015    No results found.  Assessment & Plan:   Diagnoses and all orders for this visit:  Dyslipidemia -     Lipid panel; Future -     Hepatic function panel; Future -     Basic metabolic panel; Future  Other orders -     rosuvastatin (CRESTOR) 5 MG tablet; Take 1 tablet (5 mg total) by mouth daily.   I am having Amanda Wilkins start on rosuvastatin. I am also having her maintain her Vitamin D, ALPRAZolam, multivitamin, atenolol, hydroxychloroquine, traMADol-acetaminophen, warfarin, sertraline, and acetaminophen.  Meds ordered this encounter  Medications  . acetaminophen (TYLENOL) 650 MG CR tablet    Sig: Take 650 mg by mouth every 8 (eight) hours as needed for pain.  . rosuvastatin (CRESTOR) 5 MG tablet    Sig: Take 1 tablet (5 mg total) by mouth daily.    Dispense:  30 tablet    Refill:  11     Follow-up: Return in about 3 months (around 11/30/2015) for a follow-up visit.  [name], MD

## 2015-08-30 NOTE — Assessment & Plan Note (Signed)
start Crestor Labs in 3 mo

## 2015-08-30 NOTE — Progress Notes (Signed)
Pre visit review using our clinic review tool, if applicable. No additional management support is needed unless otherwise documented below in the visit note. 

## 2015-08-30 NOTE — Assessment & Plan Note (Signed)
Due to RA 2016 Better on Plaquenil

## 2015-08-31 ENCOUNTER — Other Ambulatory Visit: Payer: Self-pay | Admitting: General Practice

## 2015-08-31 MED ORDER — WARFARIN SODIUM 5 MG PO TABS: ORAL_TABLET | ORAL | Status: DC

## 2015-09-01 ENCOUNTER — Ambulatory Visit: Payer: Self-pay

## 2015-09-05 DIAGNOSIS — B070 Plantar wart: Secondary | ICD-10-CM | POA: Diagnosis not present

## 2015-09-06 DIAGNOSIS — M65312 Trigger thumb, left thumb: Secondary | ICD-10-CM | POA: Insufficient documentation

## 2015-09-06 DIAGNOSIS — M1812 Unilateral primary osteoarthritis of first carpometacarpal joint, left hand: Secondary | ICD-10-CM | POA: Diagnosis not present

## 2015-09-06 DIAGNOSIS — M65311 Trigger thumb, right thumb: Secondary | ICD-10-CM | POA: Diagnosis not present

## 2015-09-06 DIAGNOSIS — M1811 Unilateral primary osteoarthritis of first carpometacarpal joint, right hand: Secondary | ICD-10-CM | POA: Diagnosis not present

## 2015-09-13 ENCOUNTER — Ambulatory Visit: Payer: Self-pay

## 2015-09-15 ENCOUNTER — Ambulatory Visit (INDEPENDENT_AMBULATORY_CARE_PROVIDER_SITE_OTHER): Payer: Medicare Other | Admitting: General Practice

## 2015-09-15 DIAGNOSIS — I4891 Unspecified atrial fibrillation: Secondary | ICD-10-CM

## 2015-09-15 DIAGNOSIS — Z5181 Encounter for therapeutic drug level monitoring: Secondary | ICD-10-CM | POA: Diagnosis not present

## 2015-09-15 LAB — POCT INR: INR: 1.8

## 2015-09-15 NOTE — Progress Notes (Signed)
Pre visit review using our clinic review tool, if applicable. No additional management support is needed unless otherwise documented below in the visit note. 

## 2015-09-15 NOTE — Progress Notes (Signed)
I agree with this plan.

## 2015-09-20 ENCOUNTER — Ambulatory Visit: Payer: Self-pay | Admitting: Internal Medicine

## 2015-09-27 DIAGNOSIS — D485 Neoplasm of uncertain behavior of skin: Secondary | ICD-10-CM | POA: Diagnosis not present

## 2015-10-06 DIAGNOSIS — M0609 Rheumatoid arthritis without rheumatoid factor, multiple sites: Secondary | ICD-10-CM | POA: Diagnosis not present

## 2015-10-06 DIAGNOSIS — M79642 Pain in left hand: Secondary | ICD-10-CM | POA: Diagnosis not present

## 2015-10-06 DIAGNOSIS — Z79899 Other long term (current) drug therapy: Secondary | ICD-10-CM | POA: Diagnosis not present

## 2015-10-06 DIAGNOSIS — R002 Palpitations: Secondary | ICD-10-CM | POA: Diagnosis not present

## 2015-10-06 DIAGNOSIS — M79641 Pain in right hand: Secondary | ICD-10-CM | POA: Diagnosis not present

## 2015-10-06 DIAGNOSIS — M170 Bilateral primary osteoarthritis of knee: Secondary | ICD-10-CM | POA: Diagnosis not present

## 2015-10-11 ENCOUNTER — Ambulatory Visit (INDEPENDENT_AMBULATORY_CARE_PROVIDER_SITE_OTHER): Payer: Medicare Other | Admitting: Internal Medicine

## 2015-10-11 ENCOUNTER — Encounter: Payer: Self-pay | Admitting: Internal Medicine

## 2015-10-11 VITALS — BP 136/84 | HR 94 | Temp 98.6°F | Resp 20 | Ht 66.0 in | Wt 232.5 lb

## 2015-10-11 DIAGNOSIS — S91342A Puncture wound with foreign body, left foot, initial encounter: Secondary | ICD-10-CM | POA: Diagnosis not present

## 2015-10-11 DIAGNOSIS — S91349A Puncture wound with foreign body, unspecified foot, initial encounter: Secondary | ICD-10-CM | POA: Insufficient documentation

## 2015-10-11 NOTE — Progress Notes (Signed)
Pre visit review using our clinic review tool, if applicable. No additional management support is needed unless otherwise documented below in the visit note. 

## 2015-10-11 NOTE — Progress Notes (Signed)
Subjective:  Patient ID: Amanda Wilkins, female    DOB: 01/21/1946  Age: 69 y.o. MRN: [id number]  CC: No chief complaint on file.   HPI [name] presents for  A L foot pain, sore spot x 2-3 weeks w/walking. Pt ?stepped on glass  Outpatient Prescriptions Prior to Visit  Medication Sig Dispense Refill  . acetaminophen (TYLENOL) 650 MG CR tablet Take 650 mg by mouth every 8 (eight) hours as needed for pain.    [name] ALPRAZolam (XANAX) 0.25 MG tablet Take 1 tablet (0.25 mg total) by mouth 2 (two) times daily as needed for anxiety. 60 tablet 5  . atenolol (TENORMIN) 50 MG tablet Take 1 tablet (50 mg total) by mouth daily. 90 tablet 3  . Cholecalciferol (VITAMIN D) 2000 UNITS tablet Take 2,000 Units by mouth daily.    . hydroxychloroquine (PLAQUENIL) 200 MG tablet Take 200 mg by mouth daily. TAKE 2 TABLETS BY MOUTH ONCE DAILY  3  . Multiple Vitamin (MULTIVITAMIN) capsule Take 1 capsule by mouth daily.    . rosuvastatin (CRESTOR) 5 MG tablet Take 1 tablet (5 mg total) by mouth daily. 30 tablet 11  . sertraline (ZOLOFT) 50 MG tablet Take 2 tablets daily 180 tablet 3  . traMADol-acetaminophen (ULTRACET) 37.5-325 MG tablet Takes 1 tablet daily  3  . warfarin (COUMADIN) 5 MG tablet Take as directed by anticoagulation clinic 40 tablet 3   No facility-administered medications prior to visit.    ROS Review of Systems  Constitutional: Negative for fever and chills.  Respiratory: Negative for shortness of breath.   Cardiovascular: Negative for palpitations and leg swelling.  Musculoskeletal: Positive for gait problem.  Skin: Negative for rash.    Objective:  BP 136/84 mmHg  Pulse 94  Temp(Src) 98.6 F (37 C) (Oral)  Resp 20  Ht 5\' 6"  (1.676 m)  Wt 232 lb 8 oz (105.461 kg)  BMI 37.54 kg/m2  SpO2 97%  BP Readings from Last 3 Encounters:  10/11/15 136/84  08/30/15 120/80  08/04/15 138/90    Wt Readings from Last 3 Encounters:  10/11/15 232 lb 8 oz (105.461 kg)    08/30/15 230 lb (104.327 kg)  08/04/15 224 lb (101.606 kg)    Physical Exam  Constitutional: She appears well-developed. No distress.  Musculoskeletal: She exhibits tenderness.  Skin: No rash noted. No erythema.  L lat foot 2 mm hematoma w/hyperkeratosis  Lab Results  Component Value Date   WBC 6.3 05/27/2015   HGB 13.4 05/27/2015   HCT 39.7 05/27/2015   PLT 170.0 05/27/2015   GLUCOSE 99 05/27/2015   CHOL 211* 07/25/2015   TRIG 124.0 07/25/2015   HDL 83.60 07/25/2015   LDLCALC 103* 07/25/2015   ALT 18 05/27/2015   AST 22 05/27/2015   NA 136 05/27/2015   K 4.3 05/27/2015   CL 101 05/27/2015   CREATININE 1.23* 05/27/2015   BUN 19 05/27/2015   CO2 26 05/27/2015   TSH 2.70 03/15/2015   INR 1.8 09/15/2015   HGBA1C 5.8* 01/30/2015    Procedure:   foot sliver removal, complicated  Indication:   foot glass sliver, painful  Consent: verbal  Risks and benefits were explained to the patient. Skin was cleaned with iodine and alcohol. I removed a small callus carefully with a strait blade. Skin defect was probed w/a strait blade under a microscope and a 1 mm glass sliver was identified and eventually removed with a sliver foreceps. >10 min . Pain is gone. Bandaid w/abx ointment applied.  Tolerated well. Complications: none. Bandaid applied   No results found.  Assessment & Plan:   There are no diagnoses linked to this encounter. I am having Ms. Turcotte maintain her Vitamin D, ALPRAZolam, multivitamin, atenolol, hydroxychloroquine, traMADol-acetaminophen, sertraline, acetaminophen, rosuvastatin, and warfarin.  No orders of the defined types were placed in this encounter.     Follow-up: No Follow-up on file.  [name], MD

## 2015-10-11 NOTE — Assessment & Plan Note (Signed)
12/16 L lat foot See Procedure

## 2015-10-13 ENCOUNTER — Ambulatory Visit (INDEPENDENT_AMBULATORY_CARE_PROVIDER_SITE_OTHER): Payer: Medicare Other | Admitting: General Practice

## 2015-10-13 ENCOUNTER — Encounter: Payer: Self-pay | Admitting: *Deleted

## 2015-10-13 DIAGNOSIS — I4891 Unspecified atrial fibrillation: Secondary | ICD-10-CM

## 2015-10-13 DIAGNOSIS — Z5181 Encounter for therapeutic drug level monitoring: Secondary | ICD-10-CM | POA: Diagnosis not present

## 2015-10-13 LAB — POCT INR: INR: 1.2

## 2015-10-13 NOTE — Progress Notes (Signed)
Pre visit review using our clinic review tool, if applicable. No additional management support is needed unless otherwise documented below in the visit note. 

## 2015-10-20 ENCOUNTER — Ambulatory Visit (INDEPENDENT_AMBULATORY_CARE_PROVIDER_SITE_OTHER): Payer: Medicare Other | Admitting: General Practice

## 2015-10-20 DIAGNOSIS — Z5181 Encounter for therapeutic drug level monitoring: Secondary | ICD-10-CM

## 2015-10-20 DIAGNOSIS — I4891 Unspecified atrial fibrillation: Secondary | ICD-10-CM | POA: Diagnosis not present

## 2015-10-20 LAB — POCT INR: INR: 1.9

## 2015-10-20 NOTE — Progress Notes (Signed)
Pre visit review using our clinic review tool, if applicable. No additional management support is needed unless otherwise documented below in the visit note. 

## 2015-10-22 NOTE — Progress Notes (Signed)
I agree with this this plan

## 2015-10-24 ENCOUNTER — Ambulatory Visit: Payer: Self-pay

## 2015-10-28 DIAGNOSIS — S63650A Sprain of metacarpophalangeal joint of right index finger, initial encounter: Secondary | ICD-10-CM | POA: Diagnosis not present

## 2015-10-28 DIAGNOSIS — M79641 Pain in right hand: Secondary | ICD-10-CM | POA: Diagnosis not present

## 2015-11-01 DIAGNOSIS — M25551 Pain in right hip: Secondary | ICD-10-CM | POA: Diagnosis not present

## 2015-11-03 ENCOUNTER — Ambulatory Visit (INDEPENDENT_AMBULATORY_CARE_PROVIDER_SITE_OTHER): Payer: Medicare Other | Admitting: General Practice

## 2015-11-03 DIAGNOSIS — I4891 Unspecified atrial fibrillation: Secondary | ICD-10-CM

## 2015-11-03 DIAGNOSIS — Z5181 Encounter for therapeutic drug level monitoring: Secondary | ICD-10-CM

## 2015-11-03 LAB — POCT INR: INR: 3

## 2015-11-03 NOTE — Progress Notes (Signed)
I agree with this plan.

## 2015-11-08 ENCOUNTER — Ambulatory Visit (INDEPENDENT_AMBULATORY_CARE_PROVIDER_SITE_OTHER): Payer: Medicare Other | Admitting: Neurology

## 2015-11-08 ENCOUNTER — Encounter: Payer: Self-pay | Admitting: Neurology

## 2015-11-08 VITALS — BP 110/70 | HR 86 | Ht 66.0 in | Wt 233.1 lb

## 2015-11-08 DIAGNOSIS — H5310 Unspecified subjective visual disturbances: Secondary | ICD-10-CM

## 2015-11-08 DIAGNOSIS — G43809 Other migraine, not intractable, without status migrainosus: Secondary | ICD-10-CM

## 2015-11-08 DIAGNOSIS — G43109 Migraine with aura, not intractable, without status migrainosus: Secondary | ICD-10-CM

## 2015-11-08 NOTE — Patient Instructions (Signed)
1. Reduce Zoloft back to 50mg  daily 2. Continue monitor symptoms, if they become very bothersome, we will consider restarting Elavil 3. Follow-up in 8 months, call for any changes

## 2015-11-08 NOTE — Progress Notes (Signed)
NEUROLOGY FOLLOW UP OFFICE NOTE  Amanda Wilkins [id number]  HISTORY OF PRESENT ILLNESS: I had the pleasure of seeing Amanda Wilkins in follow-up in the neurology clinic on 11/08/2015.  The patient was last seen 3 months ago for left vision changes. On her last visit, we reviewed workup done, etiology of left vision symptoms unclear, we discussed the possibility of acephalgic/ocular migraine, and increased Zoloft to 180m daily for migraine prophylaxis. She is now on Crestor for hyperlipidemia. She has not noticed any change in symptoms with increase in Zoloft dose. She has noticed that she only sees the lines in her left eye when she moves around and is more busy. There is no associated headache, eye pain, dizziness, focal numbness/tingling/weakness. No falls.   HPI: This is a pleasant 70yo RH woman with a history of atrial fibrillation, hypertension, migraines in the 1970s, in her usual state of health until August/September 2016 when she started having vision changes in her left eye. She describes all of a sudden she started seeing 2 or 3 very dark vertical lines on the lateral corner of her left eye, pulsating when she looked to the left. This changed to a light gray line and circles moving all over the place, just seen on the lateral half of her left eye. Symptoms were pretty constant, she saw Dr. CMaylene Roesand was diagnosed with ocular migraine. She then saw retinal specialist Dr. RZadie Rhinewith overall unremarkable eye exam except for age-related nuclear cataracts bilaterally, diagnosis of ocular migraine. It was unusual that symptoms had persisted for 5 weeks, and neurological evaluation was recommended. She denies any eye pain, no associated nausea, vomiting, photo or phonophobia. At first she was wondering if she had a dull headache in the right frontal region. She does have a remote history of migraines, and since starting Elavil in 1984 for depression, migraines resolved. She reports  migraines in the early 1970s were "totally crazy, everything moving around," with associated photophobia followed by a headache. She denies any recent head injuries, infections. Her paternal grandmother may have had migraines. She denies any dizziness, focal numbness/tingling/weakness, bowel/bladder dysfunction. She woke up 3 months ago with pain in her hands and feet, saw Rheumatology and was started on prednisone, reporting she was still taking prednisone when eye symptoms started. She has a history of sepsis last April due to cellulitis/cell wall induration, found to be in atrial fibrillation. She reports alopecia 4-5 years ago, but worsened hair loss after the sepsis, adding that she started having different medical conditions after the bout of sepsis.   Diagnostic Data: Her ESR was 28, CRP 0.4, overall unremarkable. Lipid panel was elevated with total cholesterol of 211, LDL 103. I personally reviewed MRI brain with and without contrast which did not show any acute changes. Her carotid doppler showed 1-39% bilateral carotid stenosis with heterogeneous plaque bilaterally, no significant stenosis.   PAST MEDICAL HISTORY: Past Medical History  Diagnosis Date  . Allergy   . Atrial fibrillation ([place]   . Migraines     on Zoloft for migraines  . Hypertension   . Chronic interstitial nephritis   . Depression   . Palpitations   . Obesity   . Arthritis     ON BOTH KNEES    MEDICATIONS: Current Outpatient Prescriptions on File Prior to Visit  Medication Sig Dispense Refill  . acetaminophen (TYLENOL) 650 MG CR tablet Take 650 mg by mouth every 8 (eight) hours as needed for pain.    .[name] Kitchenatenolol (TENORMIN)  50 MG tablet Take 1 tablet (50 mg total) by mouth daily. 90 tablet 3  . Cholecalciferol (VITAMIN D) 2000 UNITS tablet Take 2,000 Units by mouth daily.    . hydroxychloroquine (PLAQUENIL) 200 MG tablet Take 200 mg by mouth daily. TAKE 2 TABLETS BY MOUTH ONCE DAILY  3  . Multiple Vitamin  (MULTIVITAMIN) capsule Take 1 capsule by mouth daily.    . rosuvastatin (CRESTOR) 5 MG tablet Take 1 tablet (5 mg total) by mouth daily. 30 tablet 11  . sertraline (ZOLOFT) 50 MG tablet Take 2 tablets daily 180 tablet 3  . warfarin (COUMADIN) 5 MG tablet Take as directed by anticoagulation clinic 40 tablet 3   No current facility-administered medications on file prior to visit.    ALLERGIES: Allergies  Allergen Reactions  . Epinephrine Base Other (See Comments)    Seriously increases heart rate  . Aspirin Other (See Comments)    Can take the coated 325 mg. Plain asa 325 mg speeds up the heart.  . Penicillins Other (See Comments)    Pt previously has been told not to take because of family history of reactions (water blisters). She took amoxicillin in 2012-2013 with no reaction  . Tape Other (See Comments)    Blisters from adhesive tape    FAMILY HISTORY: Family History  Problem Relation Age of Onset  . Pancreatic cancer Brother   . Lymphoma Mother   . Prostate cancer Father     metastatic prostate cancer    SOCIAL HISTORY: Social History   Social History  . Marital Status: Married    Spouse Name: N/A  . Number of Children: 2  . Years of Education: N/A   Occupational History  . retired   . Part-time (with temp agency)    Social History Main Topics  . Smoking status: Never Smoker   . Smokeless tobacco: Never Used     Comment: H/O social smoking at times when drinking--never a "habit"  . Alcohol Use: 0.0 oz/week    0 Standard drinks or equivalent per week     Comment: WINE- [employer] GLASSES AT [employer]  . Drug Use: No  . Sexual Activity: Yes     Comment: intercourse age 13, sexual partners more than 5   Other Topics Concern  . Not on file   Social History Narrative    REVIEW OF SYSTEMS: Constitutional: No fevers, chills, or sweats, no generalized fatigue, change in appetite Eyes: No visual changes, double vision, eye pain Ear, nose and throat: No hearing loss, ear  pain, nasal congestion, sore throat Cardiovascular: No chest pain, palpitations Respiratory:  No shortness of breath at rest or with exertion, wheezes GastrointestinaI: No nausea, vomiting, diarrhea, abdominal pain, fecal incontinence Genitourinary:  No dysuria, urinary retention or frequency Musculoskeletal:  No neck pain, back pain Integumentary: No rash, pruritus, skin lesions Neurological: as above Psychiatric: No depression, insomnia, anxiety Endocrine: No palpitations, fatigue, diaphoresis, mood swings, change in appetite, change in weight, increased thirst Hematologic/Lymphatic:  No anemia, purpura, petechiae. Allergic/Immunologic: no itchy/runny eyes, nasal congestion, recent allergic reactions, rashes  PHYSICAL EXAM: Filed Vitals:   11/08/15 1447  BP: 110/70  Pulse: 86   General: No acute distress Head:  Normocephalic/atraumatic Neck: supple, no paraspinal tenderness, full range of motion Heart:  Regular rate and rhythm Lungs:  Clear to auscultation bilaterally Back: No paraspinal tenderness Skin/Extremities: No rash, no edema Neurological Exam: alert and oriented to person, place, and time. No aphasia or dysarthria. Fund of knowledge is appropriate.  Recent and remote memory are intact.  Attention and concentration are normal.    Able to name objects and repeat phrases. Cranial nerves: Pupils equal, round, reactive to light.  Fundoscopic exam unremarkable, no papilledema. Extraocular movements intact with no nystagmus. No visual changes on exam today. Visual fields full. Facial sensation intact. No facial asymmetry. Tongue, uvula, palate midline.  Motor: Bulk and tone normal, muscle strength 5/5 throughout with no pronator drift.  Sensation to light touch intact.  No extinction to double simultaneous stimulation.  Deep tendon reflexes 2+ throughout, toes downgoing.  Finger to nose testing intact.  Gait narrow-based and steady, able to tandem walk adequately.  Romberg  negative.  IMPRESSION: This is a pleasant 70 yo RH woman with a history of atrial fibrillation, hypertension, remote migraines, new onset left vision changes. Ophthalmological evaluation unremarkable, her 2 eye doctors have diagnosed her with ocular migraine. She denies any headaches or other focal symptoms. Her neurological exam is non-focal. MRI brain unremarkable. Carotid dopplers showed heterogeneous plaque but no significant stenosis. Consideration is for acephalgic migraine or ocular migraine, however the prolonged duration is atypical. It is curious that the visual obscurations occur when she is moving about, continue regular ophtho visits. She did not notice any difference with increased Zoloft dose, and will reduce back to 2m/day. We discussed option of trying Elavil again for presumed migraine prophylaxis, versus close clinical monitoring, particularly since symptoms are not significantly bothersome. She agrees to holding off on starting medication for now. She is now on Crestor for minimally elevated lipid panel in setting of plaque seen on carotid dopplers. She will follow-up in 8 months and knows to call our office for any changes.   Thank you for allowing me to participate in her care.  Please do not hesitate to call for any questions or concerns.  The duration of this appointment visit was 15 minutes of face-to-face time with the patient.  Greater than 50% of this time was spent in counseling, explanation of diagnosis, planning of further management, and coordination of care.   KEllouise Newer M.D.   CC: Dr. PAlain Marion

## 2015-11-11 ENCOUNTER — Telehealth: Payer: Self-pay | Admitting: Internal Medicine

## 2015-11-16 ENCOUNTER — Ambulatory Visit (INDEPENDENT_AMBULATORY_CARE_PROVIDER_SITE_OTHER): Payer: Medicare Other | Admitting: General Practice

## 2015-11-16 DIAGNOSIS — I4891 Unspecified atrial fibrillation: Secondary | ICD-10-CM | POA: Diagnosis not present

## 2015-11-16 DIAGNOSIS — Z5181 Encounter for therapeutic drug level monitoring: Secondary | ICD-10-CM | POA: Diagnosis not present

## 2015-11-16 LAB — POCT INR: INR: 4.1

## 2015-11-16 NOTE — Progress Notes (Signed)
I agree with this plan.

## 2015-11-16 NOTE — Progress Notes (Signed)
Pre visit review using our clinic review tool, if applicable. No additional management support is needed unless otherwise documented below in the visit note.  INR is high today.  Held dosage for 2 days and decreased  By 10 percent.  Encouraged patient to buy a pill box and fill it each week so that she will know if she took the medication or not and so she doesn't double dose.  Told patient to watch for any unusual bleeding or bruising due to INR being high today.  Pt verbalized understanding.

## 2015-11-28 ENCOUNTER — Ambulatory Visit (INDEPENDENT_AMBULATORY_CARE_PROVIDER_SITE_OTHER): Payer: Medicare Other | Admitting: General Practice

## 2015-11-28 DIAGNOSIS — I4891 Unspecified atrial fibrillation: Secondary | ICD-10-CM | POA: Diagnosis not present

## 2015-11-28 DIAGNOSIS — Z5181 Encounter for therapeutic drug level monitoring: Secondary | ICD-10-CM

## 2015-11-28 LAB — POCT INR: INR: 2.3

## 2015-11-28 NOTE — Progress Notes (Signed)
Pre visit review using our clinic review tool, if applicable. No additional management support is needed unless otherwise documented below in the visit note. 

## 2015-11-28 NOTE — Progress Notes (Signed)
I agree with this plan.

## 2015-11-30 ENCOUNTER — Ambulatory Visit: Payer: Self-pay | Admitting: Internal Medicine

## 2015-12-02 DIAGNOSIS — M1712 Unilateral primary osteoarthritis, left knee: Secondary | ICD-10-CM | POA: Diagnosis not present

## 2015-12-05 ENCOUNTER — Other Ambulatory Visit (INDEPENDENT_AMBULATORY_CARE_PROVIDER_SITE_OTHER): Payer: Medicare Other

## 2015-12-05 ENCOUNTER — Ambulatory Visit (INDEPENDENT_AMBULATORY_CARE_PROVIDER_SITE_OTHER): Payer: Medicare Other | Admitting: Internal Medicine

## 2015-12-05 ENCOUNTER — Telehealth: Payer: Self-pay | Admitting: Internal Medicine

## 2015-12-05 ENCOUNTER — Encounter: Payer: Self-pay | Admitting: Internal Medicine

## 2015-12-05 VITALS — BP 120/80 | HR 76 | Wt 233.0 lb

## 2015-12-05 DIAGNOSIS — E785 Hyperlipidemia, unspecified: Secondary | ICD-10-CM

## 2015-12-05 DIAGNOSIS — I480 Paroxysmal atrial fibrillation: Secondary | ICD-10-CM

## 2015-12-05 DIAGNOSIS — Z7901 Long term (current) use of anticoagulants: Secondary | ICD-10-CM

## 2015-12-05 DIAGNOSIS — M06072 Rheumatoid arthritis without rheumatoid factor, left ankle and foot: Secondary | ICD-10-CM

## 2015-12-05 DIAGNOSIS — M06071 Rheumatoid arthritis without rheumatoid factor, right ankle and foot: Secondary | ICD-10-CM

## 2015-12-05 DIAGNOSIS — R740 Nonspecific elevation of levels of transaminase and lactic acid dehydrogenase [LDH]: Secondary | ICD-10-CM

## 2015-12-05 DIAGNOSIS — M0609 Rheumatoid arthritis without rheumatoid factor, multiple sites: Secondary | ICD-10-CM | POA: Diagnosis not present

## 2015-12-05 DIAGNOSIS — M170 Bilateral primary osteoarthritis of knee: Secondary | ICD-10-CM | POA: Diagnosis not present

## 2015-12-05 DIAGNOSIS — M2550 Pain in unspecified joint: Secondary | ICD-10-CM | POA: Diagnosis not present

## 2015-12-05 DIAGNOSIS — Z79899 Other long term (current) drug therapy: Secondary | ICD-10-CM | POA: Diagnosis not present

## 2015-12-05 DIAGNOSIS — R7401 Elevation of levels of liver transaminase levels: Secondary | ICD-10-CM

## 2015-12-05 LAB — BASIC METABOLIC PANEL
BUN: 19 mg/dL (ref 6–23)
CHLORIDE: 101 meq/L (ref 96–112)
CO2: 28 mEq/L (ref 19–32)
Calcium: 9.6 mg/dL (ref 8.4–10.5)
Creatinine, Ser: 0.99 mg/dL (ref 0.40–1.20)
GFR: 59.11 mL/min — ABNORMAL LOW (ref >60.00)
Glucose, Bld: 93 mg/dL (ref 70–99)
POTASSIUM: 4.7 meq/L (ref 3.5–5.1)
SODIUM: 136 meq/L (ref 135–145)

## 2015-12-05 LAB — HEPATIC FUNCTION PANEL
ALK PHOS: 59 U/L (ref 39–117)
ALT: 21 U/L (ref 0–35)
AST: 21 U/L (ref 0–37)
Albumin: 4.4 g/dL (ref 3.5–5.2)
BILIRUBIN TOTAL: 0.5 mg/dL (ref 0.2–1.2)
Bilirubin, Direct: 0.1 mg/dL (ref 0.0–0.3)
Total Protein: 7.4 g/dL (ref 6.0–8.3)

## 2015-12-05 LAB — LIPID PANEL
CHOL/HDL RATIO: 2
CHOLESTEROL: 165 mg/dL (ref 0–200)
HDL: 92.8 mg/dL (ref >39.00)
LDL CALC: 56 mg/dL (ref 0–99)
NonHDL: 72.24
TRIGLYCERIDES: 79 mg/dL (ref 0.0–149.0)
VLDL: 15.8 mg/dL (ref 0.0–40.0)

## 2015-12-05 LAB — URINALYSIS
Bilirubin Urine: NEGATIVE
Hgb urine dipstick: NEGATIVE
Ketones, ur: NEGATIVE
LEUKOCYTES UA: NEGATIVE
Nitrite: NEGATIVE
PH: 7 (ref 5.0–8.0)
SPECIFIC GRAVITY, URINE: 1.02 (ref 1.000–1.030)
Total Protein, Urine: NEGATIVE
UROBILINOGEN UA: 0.2 (ref 0.0–1.0)
Urine Glucose: NEGATIVE

## 2015-12-05 MED ORDER — TRAMADOL HCL 50 MG PO TABS: 50.0000 mg | ORAL_TABLET | Freq: Two times a day (BID) | ORAL | Status: DC | PRN

## 2015-12-05 NOTE — Telephone Encounter (Signed)
LM at home and on cell to call Villa Herb, RN @ [phone]) to reschedule appointment for Monday 2/27 @ 1:45.

## 2015-12-05 NOTE — Assessment & Plan Note (Signed)
On coumadin 

## 2015-12-05 NOTE — Assessment & Plan Note (Addendum)
  RF(-) On Plaquenil, steroids prn  Potential benefits of a long term steroid  use as well as potential risks  and complications were explained to the patient and were aknowledged.

## 2015-12-05 NOTE — Telephone Encounter (Signed)
Please follow up with Elmyra Ricks at Dr. Dorian Heckle in regards to patients INR.

## 2015-12-05 NOTE — Assessment & Plan Note (Signed)
-   Crestor 

## 2015-12-05 NOTE — Progress Notes (Signed)
Pre visit review using our clinic review tool, if applicable. No additional management support is needed unless otherwise documented below in the visit note. 

## 2015-12-05 NOTE — Progress Notes (Signed)
Subjective:  Patient ID: Amanda Wilkins, female    DOB: May 18, 1946  Age: 70 y.o. MRN: [id number]  CC: No chief complaint on file.   HPI Amanda Wilkins presents for palpitations, HTN, RA f/u. Pt was given Prednisone today  Outpatient Prescriptions Prior to Visit  Medication Sig Dispense Refill  . acetaminophen (TYLENOL) 650 MG CR tablet Take 650 mg by mouth every 8 (eight) hours as needed for pain.    [name] atenolol (TENORMIN) 50 MG tablet Take 1 tablet (50 mg total) by mouth daily. 90 tablet 3  . Cholecalciferol (VITAMIN D) 2000 UNITS tablet Take 2,000 Units by mouth daily.    . hydroxychloroquine (PLAQUENIL) 200 MG tablet Take 200 mg by mouth daily. TAKE 2 TABLETS BY MOUTH ONCE DAILY  3  . Multiple Vitamin (MULTIVITAMIN) capsule Take 1 capsule by mouth daily.    . rosuvastatin (CRESTOR) 5 MG tablet Take 1 tablet (5 mg total) by mouth daily. 30 tablet 11  . sertraline (ZOLOFT) 50 MG tablet Take 2 tablets daily 180 tablet 3  . warfarin (COUMADIN) 5 MG tablet Take as directed by anticoagulation clinic 40 tablet 3   No facility-administered medications prior to visit.    ROS Review of Systems  Constitutional: Positive for fatigue. Negative for chills, activity change, appetite change and unexpected weight change.  HENT: Negative for congestion, mouth sores and sinus pressure.   Eyes: Negative for visual disturbance.  Respiratory: Negative for cough and chest tightness.   Gastrointestinal: Negative for nausea and abdominal pain.  Genitourinary: Negative for frequency, difficulty urinating and vaginal pain.  Musculoskeletal: Positive for myalgias and arthralgias. Negative for back pain and gait problem.  Skin: Negative for pallor and rash.  Neurological: Negative for dizziness, tremors, weakness, numbness and headaches.  Psychiatric/Behavioral: Negative for suicidal ideas, confusion and sleep disturbance.    Objective:  BP 120/80 mmHg  Pulse 76  Wt 233 lb (105.688 kg)   SpO2 96%  BP Readings from Last 3 Encounters:  12/05/15 120/80  11/08/15 110/70  10/11/15 136/84    Wt Readings from Last 3 Encounters:  12/05/15 233 lb (105.688 kg)  11/08/15 233 lb 2 oz (105.745 kg)  10/11/15 232 lb 8 oz (105.461 kg)    Physical Exam  Constitutional: She appears well-developed. No distress.  HENT:  Head: Normocephalic.  Right Ear: External ear normal.  Left Ear: External ear normal.  Nose: Nose normal.  Mouth/Throat: Oropharynx is clear and moist.  Eyes: Conjunctivae are normal. Pupils are equal, round, and reactive to light. Right eye exhibits no discharge. Left eye exhibits no discharge.  Neck: Normal range of motion. Neck supple. No JVD present. No tracheal deviation present. No thyromegaly present.  Cardiovascular: Normal rate, regular rhythm and normal heart sounds.   Pulmonary/Chest: No stridor. No respiratory distress. She has no wheezes.  Abdominal: Soft. Bowel sounds are normal. She exhibits no distension and no mass. There is no tenderness. There is no rebound and no guarding.  Musculoskeletal: She exhibits tenderness. She exhibits no edema.  Lymphadenopathy:    She has no cervical adenopathy.  Neurological: She displays normal reflexes. No cranial nerve deficit. She exhibits normal muscle tone. Coordination normal.  Skin: No rash noted. No erythema.  Psychiatric: She has a normal mood and affect. Her behavior is normal. Judgment and thought content normal.  obese   Lab Results  Component Value Date   WBC 6.3 05/27/2015   HGB 13.4 05/27/2015   HCT 39.7 05/27/2015   PLT 170.0 05/27/2015  GLUCOSE 93 12/05/2015   CHOL 165 12/05/2015   TRIG 79.0 12/05/2015   HDL 92.80 12/05/2015   LDLCALC 56 12/05/2015   ALT 21 12/05/2015   AST 21 12/05/2015   NA 136 12/05/2015   K 4.7 12/05/2015   CL 101 12/05/2015   CREATININE 0.99 12/05/2015   BUN 19 12/05/2015   CO2 28 12/05/2015   TSH 2.70 03/15/2015   INR 2.3 11/28/2015   HGBA1C 5.8* 01/30/2015      No results found.  Assessment & Plan:   Diagnoses and all orders for this visit:  Rheumatoid arthritis involving both feet with negative rheumatoid factor ([place]) -     Basic metabolic panel; Future -     Hepatic function panel; Future -     Urinalysis; Future  Paroxysmal atrial fibrillation ([facility]) -     Basic metabolic panel; Future -     Hepatic function panel; Future -     Urinalysis; Future  Dyslipidemia -     Basic metabolic panel; Future -     Hepatic function panel; Future -     Urinalysis; Future  Other orders -     traMADol (ULTRAM) 50 MG tablet; Take 1-2 tablets (50-100 mg total) by mouth every 12 (twelve) hours as needed for severe pain.   I am having [name] start on traMADol. I am also having her maintain her Vitamin D, multivitamin, atenolol, hydroxychloroquine, sertraline, acetaminophen, rosuvastatin, and warfarin.  Meds ordered this encounter  Medications  . traMADol (ULTRAM) 50 MG tablet    Sig: Take 1-2 tablets (50-100 mg total) by mouth every 12 (twelve) hours as needed for severe pain.    Dispense:  120 tablet    Refill:  2     Follow-up: Return in about 3 months (around 03/03/2016) for a follow-up visit.  [name], MD

## 2015-12-05 NOTE — Assessment & Plan Note (Signed)
Remote. Monitor LFTs

## 2015-12-12 ENCOUNTER — Ambulatory Visit (INDEPENDENT_AMBULATORY_CARE_PROVIDER_SITE_OTHER): Payer: Medicare Other | Admitting: General Practice

## 2015-12-12 ENCOUNTER — Telehealth: Payer: Self-pay | Admitting: General Practice

## 2015-12-12 DIAGNOSIS — Z5181 Encounter for therapeutic drug level monitoring: Secondary | ICD-10-CM | POA: Diagnosis not present

## 2015-12-12 DIAGNOSIS — I4891 Unspecified atrial fibrillation: Secondary | ICD-10-CM | POA: Diagnosis not present

## 2015-12-12 LAB — POCT INR: INR: 1.8

## 2015-12-12 NOTE — Telephone Encounter (Signed)
Faxed copy of AVS to the [facility] to Dr. Ellis Parents attention.  Patient is having oral surgery on 3/2.

## 2015-12-12 NOTE — Progress Notes (Signed)
I agree with this plan.

## 2015-12-12 NOTE — Progress Notes (Signed)
Pre visit review using our clinic review tool, if applicable. No additional management support is needed unless otherwise documented below in the visit note. 

## 2015-12-14 ENCOUNTER — Ambulatory Visit: Payer: Self-pay

## 2015-12-26 ENCOUNTER — Ambulatory Visit (INDEPENDENT_AMBULATORY_CARE_PROVIDER_SITE_OTHER): Payer: Medicare Other | Admitting: General Practice

## 2015-12-26 DIAGNOSIS — I4891 Unspecified atrial fibrillation: Secondary | ICD-10-CM

## 2015-12-26 DIAGNOSIS — Z5181 Encounter for therapeutic drug level monitoring: Secondary | ICD-10-CM

## 2015-12-26 LAB — POCT INR: INR: 2.7

## 2015-12-26 NOTE — Progress Notes (Signed)
Pre visit review using our clinic review tool, if applicable. No additional management support is needed unless otherwise documented below in the visit note. 

## 2016-01-01 ENCOUNTER — Other Ambulatory Visit: Payer: Self-pay | Admitting: Internal Medicine

## 2016-01-02 DIAGNOSIS — H2513 Age-related nuclear cataract, bilateral: Secondary | ICD-10-CM | POA: Diagnosis not present

## 2016-01-02 DIAGNOSIS — H5315 Visual distortions of shape and size: Secondary | ICD-10-CM | POA: Diagnosis not present

## 2016-01-02 NOTE — Telephone Encounter (Signed)
Warfarin re-fill request sent to [facility].

## 2016-01-02 NOTE — Progress Notes (Signed)
I agree with this plan.

## 2016-01-04 DIAGNOSIS — M0609 Rheumatoid arthritis without rheumatoid factor, multiple sites: Secondary | ICD-10-CM | POA: Diagnosis not present

## 2016-01-04 DIAGNOSIS — M2550 Pain in unspecified joint: Secondary | ICD-10-CM | POA: Diagnosis not present

## 2016-01-04 DIAGNOSIS — Z79899 Other long term (current) drug therapy: Secondary | ICD-10-CM | POA: Diagnosis not present

## 2016-01-04 DIAGNOSIS — M150 Primary generalized (osteo)arthritis: Secondary | ICD-10-CM | POA: Diagnosis not present

## 2016-01-09 DIAGNOSIS — H2511 Age-related nuclear cataract, right eye: Secondary | ICD-10-CM | POA: Diagnosis not present

## 2016-01-09 DIAGNOSIS — H2512 Age-related nuclear cataract, left eye: Secondary | ICD-10-CM | POA: Diagnosis not present

## 2016-01-09 DIAGNOSIS — H43813 Vitreous degeneration, bilateral: Secondary | ICD-10-CM | POA: Diagnosis not present

## 2016-01-09 DIAGNOSIS — G43109 Migraine with aura, not intractable, without status migrainosus: Secondary | ICD-10-CM | POA: Diagnosis not present

## 2016-01-13 DIAGNOSIS — M25511 Pain in right shoulder: Secondary | ICD-10-CM | POA: Diagnosis not present

## 2016-01-19 ENCOUNTER — Encounter: Payer: Self-pay | Admitting: Internal Medicine

## 2016-01-19 DIAGNOSIS — R948 Abnormal results of function studies of other organs and systems: Secondary | ICD-10-CM | POA: Diagnosis not present

## 2016-01-19 DIAGNOSIS — R635 Abnormal weight gain: Secondary | ICD-10-CM | POA: Diagnosis not present

## 2016-01-23 ENCOUNTER — Ambulatory Visit (INDEPENDENT_AMBULATORY_CARE_PROVIDER_SITE_OTHER): Payer: Medicare Other | Admitting: General Practice

## 2016-01-23 DIAGNOSIS — I4891 Unspecified atrial fibrillation: Secondary | ICD-10-CM

## 2016-01-23 DIAGNOSIS — Z5181 Encounter for therapeutic drug level monitoring: Secondary | ICD-10-CM | POA: Diagnosis not present

## 2016-01-23 LAB — POCT INR: INR: 1.9

## 2016-01-23 NOTE — Progress Notes (Signed)
Pre visit review using our clinic review tool, if applicable. No additional management support is needed unless otherwise documented below in the visit note. 

## 2016-01-23 NOTE — Progress Notes (Signed)
I agree with this plan.

## 2016-01-26 DIAGNOSIS — M1712 Unilateral primary osteoarthritis, left knee: Secondary | ICD-10-CM | POA: Diagnosis not present

## 2016-01-26 DIAGNOSIS — Z6838 Body mass index (BMI) 38.0-38.9, adult: Secondary | ICD-10-CM | POA: Diagnosis not present

## 2016-01-26 DIAGNOSIS — R635 Abnormal weight gain: Secondary | ICD-10-CM | POA: Diagnosis not present

## 2016-01-26 DIAGNOSIS — E669 Obesity, unspecified: Secondary | ICD-10-CM | POA: Diagnosis not present

## 2016-02-01 ENCOUNTER — Telehealth: Payer: Self-pay | Admitting: Internal Medicine

## 2016-02-01 NOTE — Telephone Encounter (Signed)
Pt request to speak to [facility], pt stated she is on a new diet program and was wondering if she can see you sooner?   # [phone]

## 2016-02-01 NOTE — Telephone Encounter (Signed)
Spoke with patient.

## 2016-02-13 ENCOUNTER — Ambulatory Visit (INDEPENDENT_AMBULATORY_CARE_PROVIDER_SITE_OTHER): Payer: Medicare Other | Admitting: Internal Medicine

## 2016-02-13 ENCOUNTER — Encounter: Payer: Self-pay | Admitting: Internal Medicine

## 2016-02-13 DIAGNOSIS — I480 Paroxysmal atrial fibrillation: Secondary | ICD-10-CM

## 2016-02-13 NOTE — Progress Notes (Signed)
Cardiology Office Note   Date:  02/13/2016   ID:  Jadi Warm, DOB 1945-12-01, MRN [id number]  PCP:  [name], MD  Cardiologist:   [name], MD   F/U of PAF nd SOB    History of Present Illness: Amanda Wilkins is a 70 y.o. female with a history of CP and PAF  Echo normal  Myoview with mod defect at base and mid inferolateral and inferior wall.  Onsistent with ischemia  Insurance declined CT angio  Pt with chornic SOB She was hosp   Had chronic afib I saw the pt in October 2016   Chol panel in March LDL was 56    Occasional skips    In wt loss program through [facility] in [place] then 1 meal per day  Not hungry They ssell shakes  Has blood work every 2 wks    Feeling good  No CP  No SOB     Outpatient Prescriptions Prior to Visit  Medication Sig Dispense Refill  . acetaminophen (TYLENOL) 650 MG CR tablet Take 650 mg by mouth every 8 (eight) hours as needed for pain.    [name] atenolol (TENORMIN) 50 MG tablet Take 1 tablet (50 mg total) by mouth daily. 90 tablet 3  . Cholecalciferol (VITAMIN D) 2000 UNITS tablet Take 2,000 Units by mouth daily.    . hydroxychloroquine (PLAQUENIL) 200 MG tablet Take 200 mg by mouth daily. TAKE 2 TABLETS BY MOUTH ONCE DAILY  3  . Multiple Vitamin (MULTIVITAMIN) capsule Take 1 capsule by mouth daily.    . rosuvastatin (CRESTOR) 5 MG tablet Take 1 tablet (5 mg total) by mouth daily. 30 tablet 11  . sertraline (ZOLOFT) 50 MG tablet Take 2 tablets daily 180 tablet 3  . warfarin (COUMADIN) 5 MG tablet TAKE AS DIRECTED BY COUMADIN CLINIC 40 tablet 3  . traMADol (ULTRAM) 50 MG tablet Take 1-2 tablets (50-100 mg total) by mouth every 12 (twelve) hours as needed for severe pain. (Patient not taking: Reported on 02/13/2016) 120 tablet 2   No facility-administered medications prior to visit.     Allergies:   Epinephrine base; Aspirin; Penicillins; and Tape   Past Medical History  Diagnosis Date  . Allergy   . Atrial fibrillation ([place])    . Migraines     on Zoloft for migraines  . Hypertension   . Chronic interstitial nephritis   . Depression   . Palpitations   . Obesity   . Arthritis     ON BOTH KNEES    Past Surgical History  Procedure Laterality Date  . Cesarean section      x 1  . Dental surgery      multiple implants  . Repair of torn meniscus on left 08/2014 Left 08/2015    Dr. Ronnie Derby at [facility]  . Esophagogastroduodenoscopy N/A 02/08/2015    Procedure: ESOPHAGOGASTRODUODENOSCOPY (EGD);  Surgeon: [name], MD;  Location: [facility];  Service: Endoscopy;  Laterality: N/A;     Social History:  The patient  reports that she has never smoked. She has never used smokeless tobacco. She reports that she drinks alcohol. She reports that she does not use illicit drugs.   Family History:  The patient's family history includes Lymphoma in her mother; Pancreatic cancer in her brother; Prostate cancer in her father.    ROS:  Please see the history of present illness. All other systems are reviewed and  Negative to the above problem except as noted.  PHYSICAL EXAM: VS:  BP 118/76 mmHg  Pulse 76  Ht 5\' 6"  (1.676 m)  Wt 227 lb 9.6 oz (103.239 kg)  BMI 36.75 kg/m2  GEN: Well nourished, well developed, in no acute distress HEENT: normal Neck: no JVD, carotid bruits, or masses Cardiac: RRR; no murmurs, rubs, or gallops,no edema  Respiratory:  clear to auscultation bilaterally, normal work of breathing GI: soft, nontender, nondistended, + BS  No hepatomegaly  MS: no deformity Moving all extremities   Skin: warm and dry, no rash Neuro:  Strength and sensation are intact Psych: euthymic mood, full affect   EKG:  EKG is ordered today.  SR 76 bpm     Lipid Panel    Component Value Date/Time   CHOL 165 12/05/2015 1550   TRIG 79.0 12/05/2015 1550   HDL 92.80 12/05/2015 1550   CHOLHDL 2 12/05/2015 1550   VLDL 15.8 12/05/2015 1550   LDLCALC 56 12/05/2015 1550      Wt Readings from Last 3 Encounters:   02/13/16 227 lb 9.6 oz (103.239 kg)  12/05/15 233 lb (105.688 kg)  11/08/15 233 lb 2 oz (105.745 kg)      ASSESSMENT AND PLAN:  1  Dyspnea.  Doing good  No CP  2  PAF  In SR today  No symptoms  3. HL  LDL and HDL are excellent  4.  Morbid obesity  In wt loss program  [facility]    I will follow up in Jan  2018   Signed, [name], MD  02/13/2016 11:21 AM    [address], [place], [place]  [address] Phone: [phone]; Fax: [phone]

## 2016-02-13 NOTE — Patient Instructions (Signed)
Your physician recommends that you continue on your current medications as directed. Please refer to the Current Medication list given to you today. Your physician wants you to follow-up in: January, 2018 with Dr. Ross.  You will receive a reminder letter in the mail two months in advance. If you don't receive a letter, please call our office to schedule the follow-up appointment.  

## 2016-02-15 ENCOUNTER — Telehealth: Payer: Self-pay | Admitting: Internal Medicine

## 2016-02-15 NOTE — Telephone Encounter (Signed)
Patient reported BP of 111/66 earlier today was after making 2 trips from the car through the garage with groceries.  She felt tired so she checked BP.  Checked BP again after sitting, reading for a while this afternoon and it was 86/77.  HR has been 60s.   Her husband uses the same cuff and his is normal/higher.  She takes atenolol 50 mg once a day.  I advised that tomorrow check BP prior to atenolol dose and take 1/2 tabled if her pressure is [id number] systolic.  If it is lower than that, hold the dose. Advised Dr. [name] will be in the office tomorrow and we will call her back with further recommendations  I adv to drink some extra fluids today (she tries for 8 glasses and has only had 3-4 today), also adv to make position changes slowly.  She is appreciative for this information.

## 2016-02-15 NOTE — Telephone Encounter (Signed)
Pt c/o BP issue: STAT if pt c/o blurred vision, one-sided weakness or slurred speech  1. What are your last 5 BP readings? 111/66- today 5/3  2. Are you having any other symptoms (ex. Dizziness, headache, blurred vision, passed out)? Felt tired after bringing gorceries, denied dizziness  3. What is your BP issue? BP low wanted to discuss w/ RN

## 2016-02-20 ENCOUNTER — Ambulatory Visit (INDEPENDENT_AMBULATORY_CARE_PROVIDER_SITE_OTHER): Payer: Medicare Other | Admitting: General Practice

## 2016-02-20 DIAGNOSIS — Z5181 Encounter for therapeutic drug level monitoring: Secondary | ICD-10-CM | POA: Diagnosis not present

## 2016-02-20 DIAGNOSIS — I4891 Unspecified atrial fibrillation: Secondary | ICD-10-CM | POA: Diagnosis not present

## 2016-02-20 LAB — POCT INR: INR: 1.4

## 2016-02-20 NOTE — Progress Notes (Signed)
Pre visit review using our clinic review tool, if applicable. No additional management support is needed unless otherwise documented below in the visit note. 

## 2016-02-21 ENCOUNTER — Telehealth: Payer: Self-pay | Admitting: General Practice

## 2016-02-21 MED ORDER — ATENOLOL 50 MG PO TABS: 25.0000 mg | ORAL_TABLET | Freq: Every day | ORAL | Status: DC

## 2016-02-21 NOTE — Telephone Encounter (Signed)
Spoke to patient   BP was  94  Not dizzy Recomm she cut back on atenolol  25  Mg    Na 131    Will have blood work every 2 weeks

## 2016-02-21 NOTE — Telephone Encounter (Signed)
Adjusted medication list to reflect atenolol dose change.

## 2016-02-21 NOTE — Progress Notes (Signed)
I agree with this plan.

## 2016-02-21 NOTE — Telephone Encounter (Signed)
-----   Message from Fay Records, MD sent at 02/21/2016  4:12 PM EDT ----- Regarding: RE: Anticoagulation That is fine  I have no preference ----- Message -----    From: Warden Fillers, RN    Sent: 02/20/2016   1:50 PM      To: Fay Records, MD Subject: Anticoagulation                                Hi Dr. [name],  Patient's INR today was 1.4.  She is on a weight loss program and is apparently consuming a lot of vitamin K.  Would you be opposed to patient taking Xarelto or Eliquis?    Villa Herb, RN

## 2016-02-27 ENCOUNTER — Ambulatory Visit (INDEPENDENT_AMBULATORY_CARE_PROVIDER_SITE_OTHER): Payer: Medicare Other | Admitting: Gynecology

## 2016-02-27 ENCOUNTER — Encounter: Payer: Self-pay | Admitting: Gynecology

## 2016-02-27 VITALS — BP 134/82

## 2016-02-27 DIAGNOSIS — N644 Mastodynia: Secondary | ICD-10-CM

## 2016-02-27 NOTE — Progress Notes (Signed)
  Patient is a 70 year old presented to the office today stating which she was washing herself today she noticed an abnormality on her left axillary region. She denies any recent trauma. She denied any nipple discharge or any unusual bruising. She is working more in the gym and using that are more than her contralateral arm. She does have a sister had breast cancer at the age of 12. Patient's mammogram was in July 2016 and was normal. Patient is on no hormone replacement therapy. She is on 5 mg of Coumadin daily because of her atrial fibrillation history.   ROS: A ROS was performed and pertinent positives and negatives are included in the history.  GENERAL: No fevers or chills. HEENT: No change in vision, no earache, sore throat or sinus congestion. NECK: No pain or stiffness. CARDIOVASCULAR: No chest pain or pressure. No palpitations. PULMONARY: No shortness of breath, cough or wheeze. GASTROINTESTINAL: No abdominal pain, nausea, vomiting or diarrhea, melena or bright red blood per rectum. GENITOURINARY: No urinary frequency, urgency, hesitancy or dysuria. MUSCULOSKELETAL: No joint or muscle pain, no back pain, no recent trauma. DERMATOLOGIC: No rash, no itching, no lesions. ENDOCRINE: No polyuria, polydipsia, no heat or cold intolerance. No recent change in weight. HEMATOLOGICAL: No anemia or easy bruising or bleeding. NEUROLOGIC: No headache, seizures, numbness, tingling or weakness. PSYCHIATRIC: No depression, no loss of interest in normal activity or change in sleep pattern.   Both breasts were examined sitting supine position both breasts were pendulous no nipple inversion no skin discoloration no supraclavicular axillary lymphadenopathy on either breast. The area the patient was concern appears to be a tendon from the axillary region no abnormality otherwise noted.  Assessment/plan: Patient with normal breast exam today no palpable masses axillary region of concern was only a tendon she was feeling.  She is due for mammogram in July as well as her annual exam.  Greater than 50% of the time was spent examining and reassuring the patient on the findings on today's exam. Time of consultation  15 minutes

## 2016-02-28 DIAGNOSIS — M25511 Pain in right shoulder: Secondary | ICD-10-CM | POA: Diagnosis not present

## 2016-02-28 DIAGNOSIS — G8929 Other chronic pain: Secondary | ICD-10-CM | POA: Insufficient documentation

## 2016-02-29 ENCOUNTER — Encounter: Payer: Self-pay | Admitting: Internal Medicine

## 2016-02-29 ENCOUNTER — Other Ambulatory Visit: Payer: Self-pay | Admitting: Orthopedic Surgery

## 2016-02-29 ENCOUNTER — Ambulatory Visit (INDEPENDENT_AMBULATORY_CARE_PROVIDER_SITE_OTHER): Payer: Medicare Other | Admitting: Internal Medicine

## 2016-02-29 VITALS — BP 120/60 | HR 86 | Temp 98.6°F | Resp 18 | Ht 65.0 in | Wt 225.0 lb

## 2016-02-29 DIAGNOSIS — E871 Hypo-osmolality and hyponatremia: Secondary | ICD-10-CM | POA: Diagnosis not present

## 2016-02-29 DIAGNOSIS — E785 Hyperlipidemia, unspecified: Secondary | ICD-10-CM | POA: Diagnosis not present

## 2016-02-29 DIAGNOSIS — M25511 Pain in right shoulder: Secondary | ICD-10-CM

## 2016-02-29 DIAGNOSIS — I480 Paroxysmal atrial fibrillation: Secondary | ICD-10-CM | POA: Diagnosis not present

## 2016-02-29 DIAGNOSIS — M06071 Rheumatoid arthritis without rheumatoid factor, right ankle and foot: Secondary | ICD-10-CM

## 2016-02-29 DIAGNOSIS — M06072 Rheumatoid arthritis without rheumatoid factor, left ankle and foot: Secondary | ICD-10-CM | POA: Diagnosis not present

## 2016-02-29 NOTE — Progress Notes (Signed)
Subjective:  Patient ID: Amanda Wilkins, female    DOB: 12/25/1945  Age: 70 y.o. MRN: [id number]  CC: No chief complaint on file.   HPI Amanda Wilkins presents for A fib, HTN, RA f/u. Pt said she had Na 131 w/diet Clinic w/W-F  Outpatient Prescriptions Prior to Visit  Medication Sig Dispense Refill  . acetaminophen (TYLENOL) 650 MG CR tablet Take 650 mg by mouth every 8 (eight) hours as needed for pain.    [name] atenolol (TENORMIN) 50 MG tablet Take 0.5 tablets (25 mg total) by mouth daily. 90 tablet 3  . Cholecalciferol (VITAMIN D) 2000 UNITS tablet Take 2,000 Units by mouth daily.    . hydroxychloroquine (PLAQUENIL) 200 MG tablet Take 200 mg by mouth daily. Reported on 02/27/2016  3  . Multiple Vitamin (MULTIVITAMIN) capsule Take 1 capsule by mouth daily.    . rosuvastatin (CRESTOR) 5 MG tablet Take 1 tablet (5 mg total) by mouth daily. 30 tablet 11  . sertraline (ZOLOFT) 50 MG tablet Take 2 tablets daily 180 tablet 3  . warfarin (COUMADIN) 5 MG tablet TAKE AS DIRECTED BY COUMADIN CLINIC 40 tablet 3   No facility-administered medications prior to visit.    ROS Review of Systems  Constitutional: Negative for chills, activity change, appetite change, fatigue and unexpected weight change.  HENT: Negative for congestion, mouth sores and sinus pressure.   Eyes: Negative for visual disturbance.  Respiratory: Negative for cough and chest tightness.   Gastrointestinal: Negative for nausea and abdominal pain.  Genitourinary: Negative for frequency, difficulty urinating and vaginal pain.  Musculoskeletal: Negative for back pain and gait problem.  Skin: Negative for pallor and rash.  Neurological: Negative for dizziness, tremors, weakness, numbness and headaches.  Psychiatric/Behavioral: Negative for confusion and sleep disturbance.    Objective:  BP 120/60 mmHg  Pulse 86  Temp(Src) 98.6 F (37 C) (Oral)  Resp 18  Ht 5\' 5"  (1.651 m)  Wt 225 lb (102.059 kg)  BMI 37.44 kg/m2   SpO2 97%  BP Readings from Last 3 Encounters:  02/29/16 120/60  02/27/16 134/82  02/13/16 118/76    Wt Readings from Last 3 Encounters:  02/29/16 225 lb (102.059 kg)  02/13/16 227 lb 9.6 oz (103.239 kg)  12/05/15 233 lb (105.688 kg)    Physical Exam  Constitutional: She appears well-developed. No distress.  HENT:  Head: Normocephalic.  Right Ear: External ear normal.  Left Ear: External ear normal.  Nose: Nose normal.  Mouth/Throat: Oropharynx is clear and moist.  Eyes: Conjunctivae are normal. Pupils are equal, round, and reactive to light. Right eye exhibits no discharge. Left eye exhibits no discharge.  Neck: Normal range of motion. Neck supple. No JVD present. No tracheal deviation present. No thyromegaly present.  Cardiovascular: Normal rate, regular rhythm and normal heart sounds.   Pulmonary/Chest: No stridor. No respiratory distress. She has no wheezes.  Abdominal: Soft. Bowel sounds are normal. She exhibits no distension and no mass. There is no tenderness. There is no rebound and no guarding.  Musculoskeletal: She exhibits no edema or tenderness.  Lymphadenopathy:    She has no cervical adenopathy.  Neurological: She displays normal reflexes. No cranial nerve deficit. She exhibits normal muscle tone. Coordination normal.  Skin: No rash noted. No erythema.  Psychiatric: She has a normal mood and affect. Her behavior is normal. Judgment and thought content normal.    Lab Results  Component Value Date   WBC 6.3 05/27/2015   HGB 13.4 05/27/2015   HCT  39.7 05/27/2015   PLT 170.0 05/27/2015   GLUCOSE 93 12/05/2015   CHOL 165 12/05/2015   TRIG 79.0 12/05/2015   HDL 92.80 12/05/2015   LDLCALC 56 12/05/2015   ALT 21 12/05/2015   AST 21 12/05/2015   NA 136 12/05/2015   K 4.7 12/05/2015   CL 101 12/05/2015   CREATININE 0.99 12/05/2015   BUN 19 12/05/2015   CO2 28 12/05/2015   TSH 2.70 03/15/2015   INR 1.4 02/20/2016   HGBA1C 5.8* 01/30/2015    No results  found.  Assessment & Plan:   There are no diagnoses linked to this encounter. I am having Ms. Whiteley maintain her Vitamin D, multivitamin, hydroxychloroquine, sertraline, acetaminophen, rosuvastatin, warfarin, and atenolol.  No orders of the defined types were placed in this encounter.     Follow-up: No Follow-up on file.  [name], MD

## 2016-02-29 NOTE — Assessment & Plan Note (Signed)
Bisoprolol, Cardizem, Coumadin

## 2016-02-29 NOTE — Assessment & Plan Note (Signed)
-   Crestor 

## 2016-02-29 NOTE — Assessment & Plan Note (Signed)
On Plaquenil, steroids prn

## 2016-02-29 NOTE — Assessment & Plan Note (Signed)
5/17 per pt - dilutional - reduce water intake

## 2016-02-29 NOTE — Progress Notes (Signed)
Pre visit review using our clinic review tool, if applicable. No additional management support is needed unless otherwise documented below in the visit note. 

## 2016-03-05 ENCOUNTER — Other Ambulatory Visit: Payer: Self-pay | Admitting: General Practice

## 2016-03-05 ENCOUNTER — Ambulatory Visit (INDEPENDENT_AMBULATORY_CARE_PROVIDER_SITE_OTHER): Payer: Medicare Other | Admitting: General Practice

## 2016-03-05 DIAGNOSIS — Z5181 Encounter for therapeutic drug level monitoring: Secondary | ICD-10-CM

## 2016-03-05 LAB — POCT INR: INR: 1.6

## 2016-03-05 MED ORDER — RIVAROXABAN 20 MG PO TABS: 20.0000 mg | ORAL_TABLET | Freq: Every day | ORAL | Status: DC

## 2016-03-05 NOTE — Progress Notes (Signed)
Pre visit review using our clinic review tool, if applicable. No additional management support is needed unless otherwise documented below in the visit note. 

## 2016-03-06 NOTE — Progress Notes (Signed)
I agree with this plan.

## 2016-03-07 ENCOUNTER — Ambulatory Visit
Admission: RE | Admit: 2016-03-07 | Discharge: 2016-03-07 | Disposition: A | Discharge status: Home / Self Care | Payer: Medicare Other | Source: Ambulatory Visit | Attending: Orthopedic Surgery | Admitting: Orthopedic Surgery

## 2016-03-07 DIAGNOSIS — S46011A Strain of muscle(s) and tendon(s) of the rotator cuff of right shoulder, initial encounter: Secondary | ICD-10-CM | POA: Diagnosis not present

## 2016-03-07 DIAGNOSIS — M25511 Pain in right shoulder: Secondary | ICD-10-CM

## 2016-03-08 DIAGNOSIS — M7551 Bursitis of right shoulder: Secondary | ICD-10-CM | POA: Diagnosis not present

## 2016-03-08 DIAGNOSIS — M7541 Impingement syndrome of right shoulder: Secondary | ICD-10-CM | POA: Diagnosis not present

## 2016-03-27 DIAGNOSIS — M79641 Pain in right hand: Secondary | ICD-10-CM | POA: Diagnosis not present

## 2016-03-27 DIAGNOSIS — M0609 Rheumatoid arthritis without rheumatoid factor, multiple sites: Secondary | ICD-10-CM | POA: Diagnosis not present

## 2016-03-27 DIAGNOSIS — M150 Primary generalized (osteo)arthritis: Secondary | ICD-10-CM | POA: Diagnosis not present

## 2016-03-27 DIAGNOSIS — M79642 Pain in left hand: Secondary | ICD-10-CM | POA: Diagnosis not present

## 2016-03-27 DIAGNOSIS — Z79899 Other long term (current) drug therapy: Secondary | ICD-10-CM | POA: Diagnosis not present

## 2016-03-27 DIAGNOSIS — M2550 Pain in unspecified joint: Secondary | ICD-10-CM | POA: Diagnosis not present

## 2016-04-04 DIAGNOSIS — M94211 Chondromalacia, right shoulder: Secondary | ICD-10-CM | POA: Diagnosis not present

## 2016-04-04 DIAGNOSIS — M19011 Primary osteoarthritis, right shoulder: Secondary | ICD-10-CM | POA: Diagnosis not present

## 2016-04-04 DIAGNOSIS — S46211A Strain of muscle, fascia and tendon of other parts of biceps, right arm, initial encounter: Secondary | ICD-10-CM | POA: Diagnosis not present

## 2016-04-04 DIAGNOSIS — M7541 Impingement syndrome of right shoulder: Secondary | ICD-10-CM | POA: Diagnosis not present

## 2016-04-04 DIAGNOSIS — Y999 Unspecified external cause status: Secondary | ICD-10-CM | POA: Diagnosis not present

## 2016-04-04 DIAGNOSIS — G8918 Other acute postprocedural pain: Secondary | ICD-10-CM | POA: Diagnosis not present

## 2016-04-04 DIAGNOSIS — M65811 Other synovitis and tenosynovitis, right shoulder: Secondary | ICD-10-CM | POA: Diagnosis not present

## 2016-04-04 DIAGNOSIS — M659 Synovitis and tenosynovitis, unspecified: Secondary | ICD-10-CM | POA: Diagnosis not present

## 2016-04-04 DIAGNOSIS — S43431A Superior glenoid labrum lesion of right shoulder, initial encounter: Secondary | ICD-10-CM | POA: Diagnosis not present

## 2016-04-04 DIAGNOSIS — M66811 Spontaneous rupture of other tendons, right shoulder: Secondary | ICD-10-CM | POA: Diagnosis not present

## 2016-04-04 DIAGNOSIS — Y939 Activity, unspecified: Secondary | ICD-10-CM | POA: Diagnosis not present

## 2016-04-04 DIAGNOSIS — M75111 Incomplete rotator cuff tear or rupture of right shoulder, not specified as traumatic: Secondary | ICD-10-CM | POA: Diagnosis not present

## 2016-04-04 HISTORY — PX: SHOULDER SURGERY: SHX246

## 2016-04-09 DIAGNOSIS — M25511 Pain in right shoulder: Secondary | ICD-10-CM | POA: Diagnosis not present

## 2016-04-09 DIAGNOSIS — M7541 Impingement syndrome of right shoulder: Secondary | ICD-10-CM | POA: Diagnosis not present

## 2016-04-09 DIAGNOSIS — R29898 Other symptoms and signs involving the musculoskeletal system: Secondary | ICD-10-CM | POA: Diagnosis not present

## 2016-04-09 DIAGNOSIS — M25611 Stiffness of right shoulder, not elsewhere classified: Secondary | ICD-10-CM | POA: Diagnosis not present

## 2016-04-09 DIAGNOSIS — S43431D Superior glenoid labrum lesion of right shoulder, subsequent encounter: Secondary | ICD-10-CM | POA: Diagnosis not present

## 2016-04-10 DIAGNOSIS — Z9889 Other specified postprocedural states: Secondary | ICD-10-CM | POA: Insufficient documentation

## 2016-04-12 DIAGNOSIS — M7541 Impingement syndrome of right shoulder: Secondary | ICD-10-CM | POA: Diagnosis not present

## 2016-04-12 DIAGNOSIS — R29898 Other symptoms and signs involving the musculoskeletal system: Secondary | ICD-10-CM | POA: Diagnosis not present

## 2016-04-12 DIAGNOSIS — M25611 Stiffness of right shoulder, not elsewhere classified: Secondary | ICD-10-CM | POA: Diagnosis not present

## 2016-04-12 DIAGNOSIS — M25511 Pain in right shoulder: Secondary | ICD-10-CM | POA: Diagnosis not present

## 2016-04-13 ENCOUNTER — Other Ambulatory Visit: Payer: Self-pay | Admitting: Gynecology

## 2016-04-13 DIAGNOSIS — Z1231 Encounter for screening mammogram for malignant neoplasm of breast: Secondary | ICD-10-CM

## 2016-04-16 DIAGNOSIS — R29898 Other symptoms and signs involving the musculoskeletal system: Secondary | ICD-10-CM | POA: Diagnosis not present

## 2016-04-16 DIAGNOSIS — M7541 Impingement syndrome of right shoulder: Secondary | ICD-10-CM | POA: Diagnosis not present

## 2016-04-16 DIAGNOSIS — M25611 Stiffness of right shoulder, not elsewhere classified: Secondary | ICD-10-CM | POA: Diagnosis not present

## 2016-04-16 DIAGNOSIS — M25511 Pain in right shoulder: Secondary | ICD-10-CM | POA: Diagnosis not present

## 2016-04-18 IMAGING — MR MR KNEE*L* W/O CM
4 of 6 series · 19 of 40 positions shown · non-contrast
Comparison: Radiographs 07/05/2014.

CLINICAL DATA: Medial LEFT knee pain for 2 months. Arthritis.
Cortisone injection with some relief. LEFT knee pain.

EXAM:
MRI OF THE LEFT KNEE WITHOUT CONTRAST
TECHNIQUE: Multiplanar, multisequence MR imaging of the knee was performed. No
intravenous contrast was administered.

[Series 6: PD fat-sat · axial · 3.5mm · 0.31mm/px · z∈[-66,+38]mm · 7 of 26 slices shown (1 of 4)]
[im 1/26]
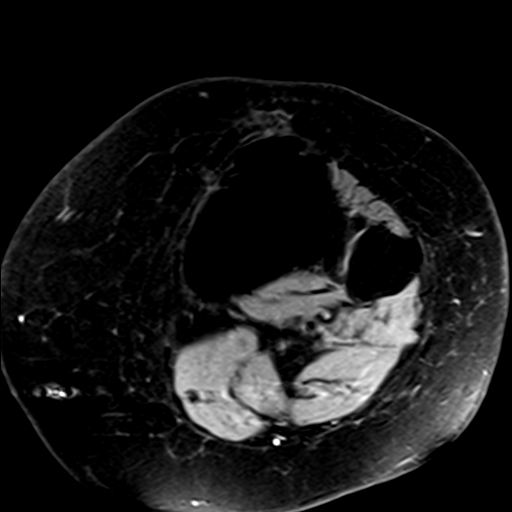
[im 5/26]
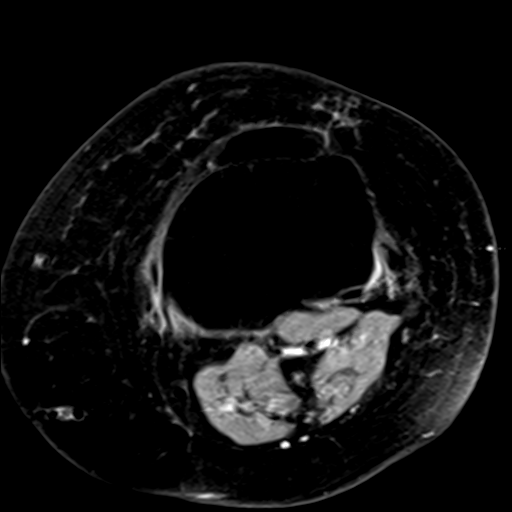
[im 9/26]
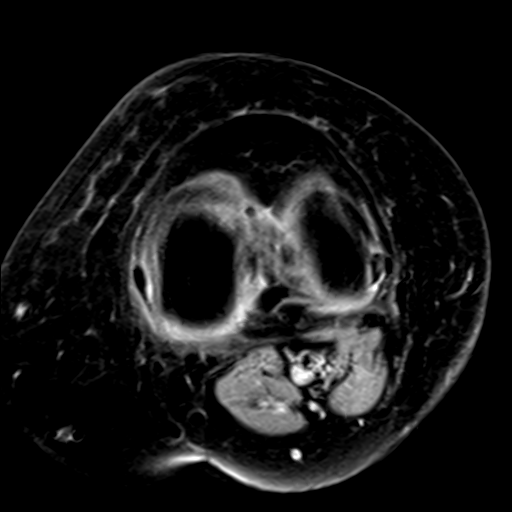
[im 13/26]
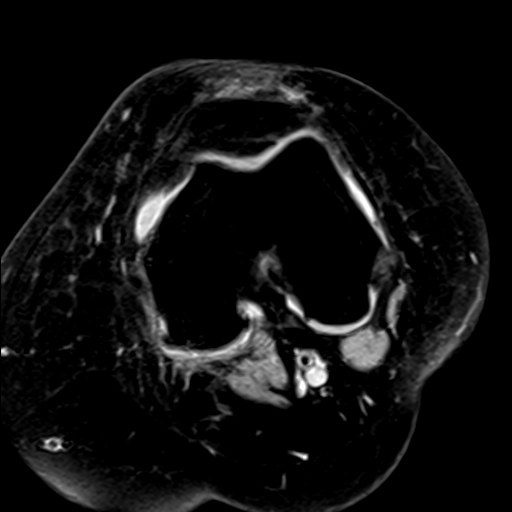
[im 17/26]
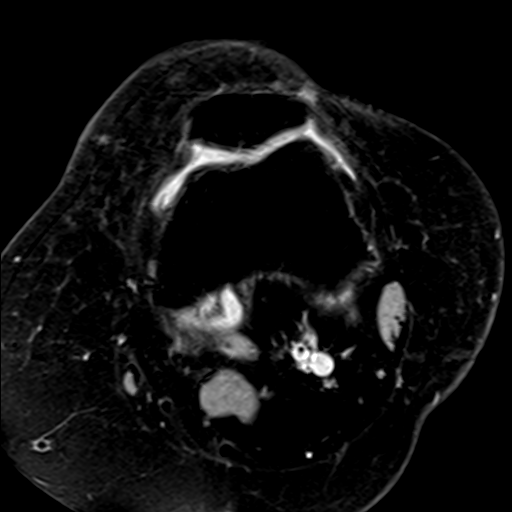
[im 21/26]
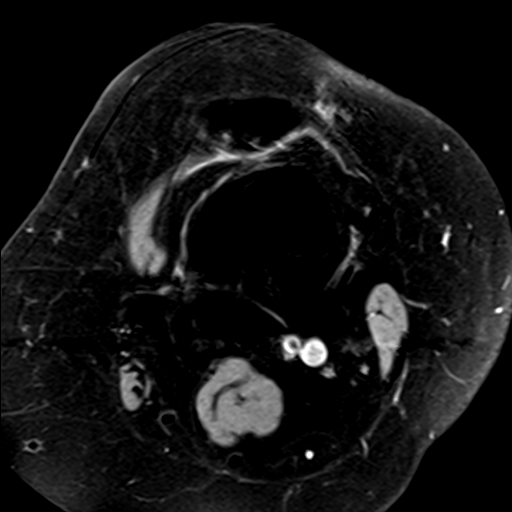
[im 26/26]
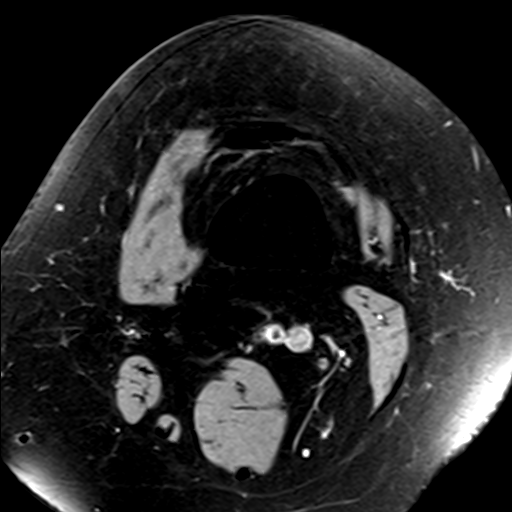

[Series 7: PD fat-sat · coronal · 3.5mm · 0.29mm/px · 6 of 22 slices shown (2 of 4)]
[im 1/22]
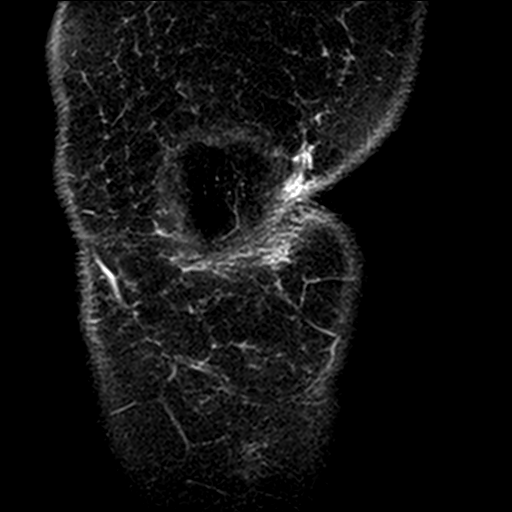
[im 4/22]
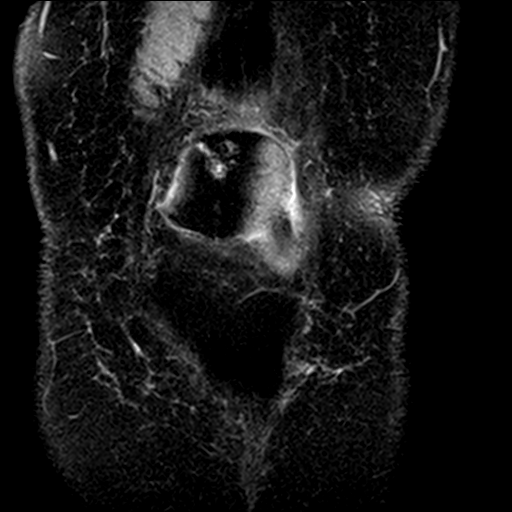
[im 8/22]
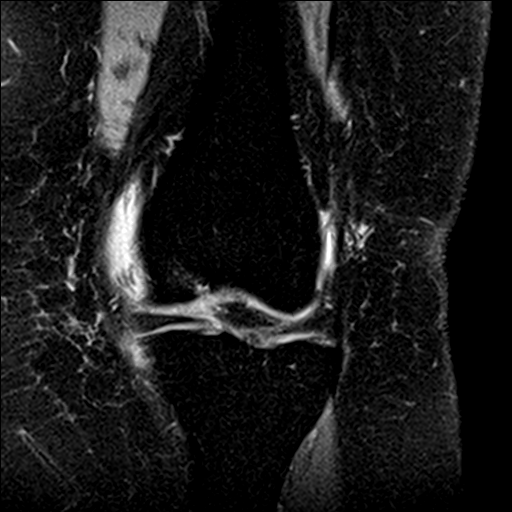
[im 11/22]
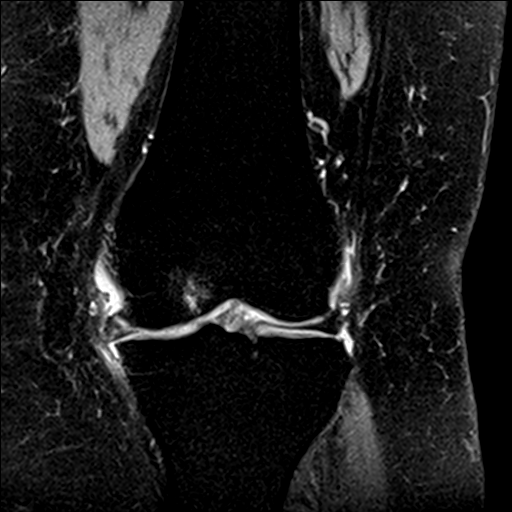
[im 15/22]
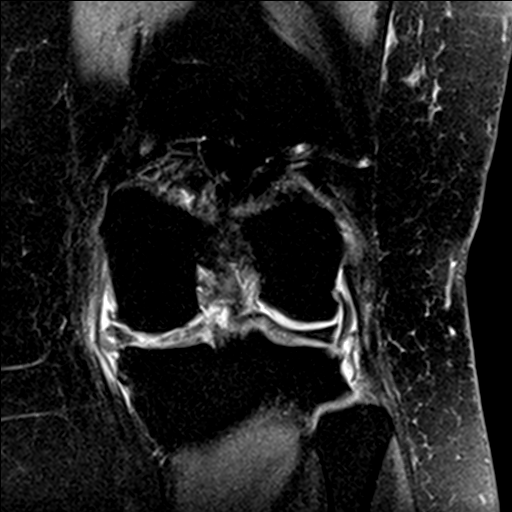
[im 18/22]
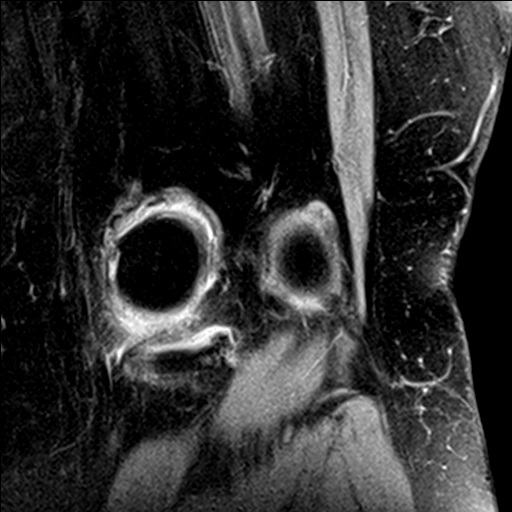

[Series 9: PD fat-sat · sagittal · 3.2mm · 0.29mm/px · 3 of 26 slices shown (3 of 4)]
[im 4/26]
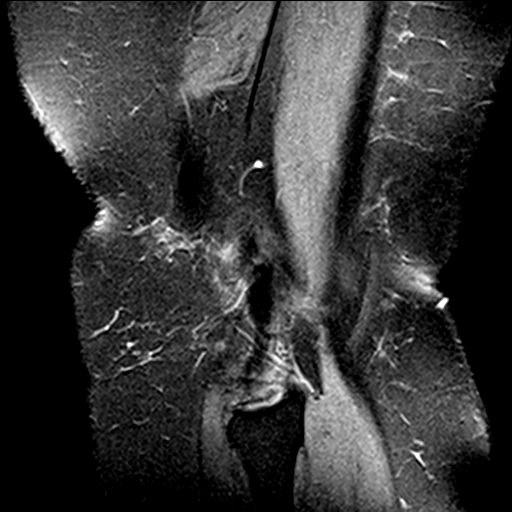
[im 15/26]
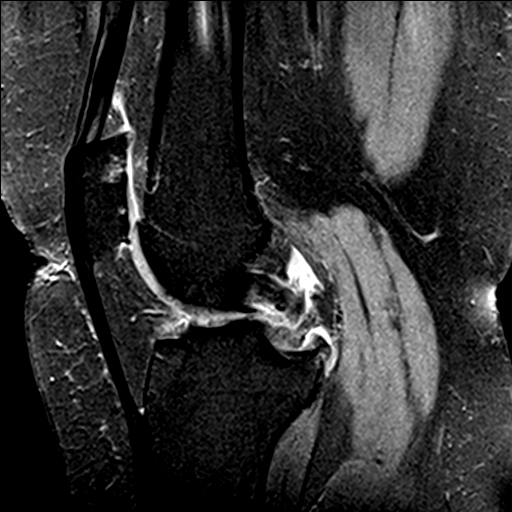
[im 22/26]
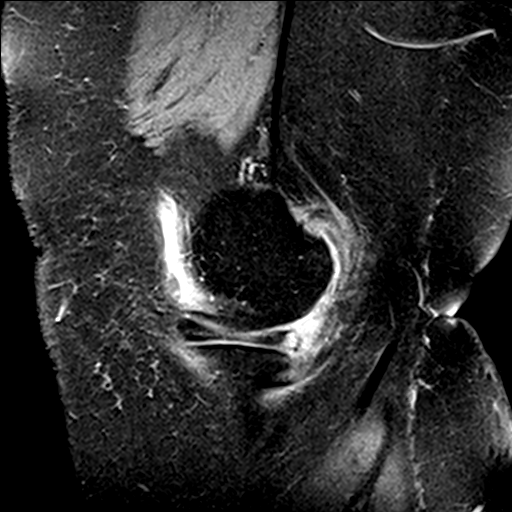

[Series 11: PD fat-sat · oblique · 2.0mm · 0.29mm/px · 3 of 14 slices shown (4 of 4)]
[im 1/14]
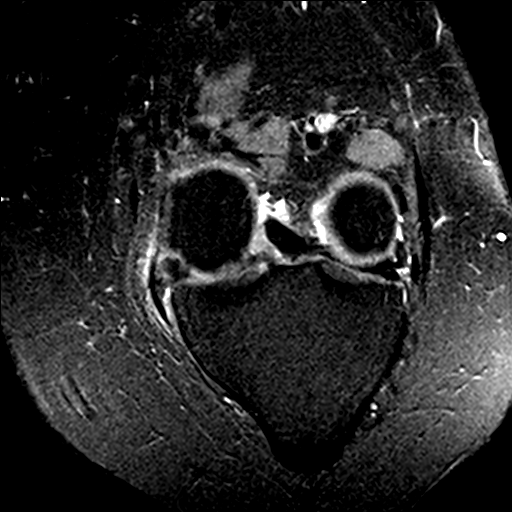
[im 9/14]
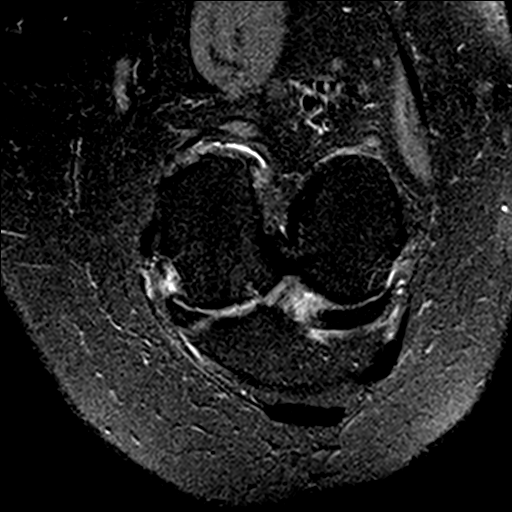
[im 14/14]
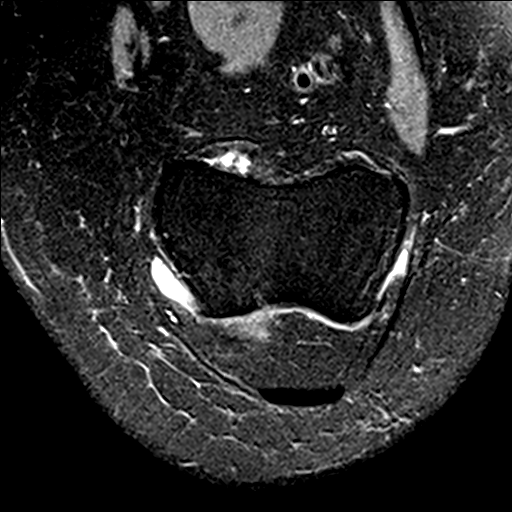

[19 of 40 positions shown; findings below may reference images not displayed]

FINDINGS: MENISCI

Medial meniscus: Radial tear at the posterior horn and body junction
of the medial meniscus (image 20 series 6). Resulting loss of hoop
strength and medial meniscal extrusion. Degenerative changes extend
to the posterior horn root.

Lateral meniscus:  Intact.

LIGAMENTS

Cruciates:  Intact.

Collaterals:  Intact.

CARTILAGE

Patellofemoral: Severe osteoarthritis. Grade IV chondromalacia at
the superior apex with subchondral cysts. Complementary inferior
trochlear sulcus chondromalacia.

Medial: Moderate osteoarthritis with diffuse grade II and focal
areas of grade III chondromalacia.

Lateral: Mild osteoarthritis. No focal defects. Areas of grade II
fissuring.

Joint:  Small effusion.

Popliteal Fossa:  Normal.

Extensor Mechanism:  Intact.

Bones:  No aggressive osseous lesions.
IMPRESSION: 1. Large radial tear of the medial meniscus posterior horn and body
junction with extrusion of the body.
2. Severe patellofemoral and moderate medial compartment
osteoarthritis.

## 2016-04-19 DIAGNOSIS — M7541 Impingement syndrome of right shoulder: Secondary | ICD-10-CM | POA: Diagnosis not present

## 2016-04-19 DIAGNOSIS — M25611 Stiffness of right shoulder, not elsewhere classified: Secondary | ICD-10-CM | POA: Diagnosis not present

## 2016-04-19 DIAGNOSIS — M25511 Pain in right shoulder: Secondary | ICD-10-CM | POA: Diagnosis not present

## 2016-04-19 DIAGNOSIS — R29898 Other symptoms and signs involving the musculoskeletal system: Secondary | ICD-10-CM | POA: Diagnosis not present

## 2016-04-25 DIAGNOSIS — R29898 Other symptoms and signs involving the musculoskeletal system: Secondary | ICD-10-CM | POA: Diagnosis not present

## 2016-04-25 DIAGNOSIS — M7541 Impingement syndrome of right shoulder: Secondary | ICD-10-CM | POA: Diagnosis not present

## 2016-04-25 DIAGNOSIS — M25611 Stiffness of right shoulder, not elsewhere classified: Secondary | ICD-10-CM | POA: Diagnosis not present

## 2016-04-25 DIAGNOSIS — M25511 Pain in right shoulder: Secondary | ICD-10-CM | POA: Diagnosis not present

## 2016-04-30 ENCOUNTER — Encounter: Payer: Self-pay | Admitting: Gynecology

## 2016-04-30 ENCOUNTER — Ambulatory Visit (INDEPENDENT_AMBULATORY_CARE_PROVIDER_SITE_OTHER): Payer: Medicare Other | Admitting: Gynecology

## 2016-04-30 ENCOUNTER — Emergency Department (HOSPITAL_COMMUNITY)
Admission: EM | Admit: 2016-04-30 | Discharge: 2016-04-30 | Disposition: A | Discharge status: Home / Self Care | Payer: Medicare Other | Attending: Emergency Medicine | Admitting: Emergency Medicine

## 2016-04-30 ENCOUNTER — Encounter (HOSPITAL_COMMUNITY): Payer: Self-pay | Admitting: Emergency Medicine

## 2016-04-30 ENCOUNTER — Emergency Department (HOSPITAL_COMMUNITY): Payer: Medicare Other

## 2016-04-30 VITALS — BP 132/84 | Ht 65.0 in | Wt 217.0 lb

## 2016-04-30 DIAGNOSIS — Z7901 Long term (current) use of anticoagulants: Secondary | ICD-10-CM | POA: Insufficient documentation

## 2016-04-30 DIAGNOSIS — N952 Postmenopausal atrophic vaginitis: Secondary | ICD-10-CM

## 2016-04-30 DIAGNOSIS — Z79890 Hormone replacement therapy (postmenopausal): Secondary | ICD-10-CM

## 2016-04-30 DIAGNOSIS — R000 Tachycardia, unspecified: Secondary | ICD-10-CM | POA: Diagnosis not present

## 2016-04-30 DIAGNOSIS — N951 Menopausal and female climacteric states: Secondary | ICD-10-CM

## 2016-04-30 DIAGNOSIS — I10 Essential (primary) hypertension: Secondary | ICD-10-CM | POA: Insufficient documentation

## 2016-04-30 DIAGNOSIS — Z01419 Encounter for gynecological examination (general) (routine) without abnormal findings: Secondary | ICD-10-CM | POA: Diagnosis not present

## 2016-04-30 DIAGNOSIS — R002 Palpitations: Secondary | ICD-10-CM | POA: Insufficient documentation

## 2016-04-30 LAB — BASIC METABOLIC PANEL
ANION GAP: 7 (ref 5–15)
BUN: 21 mg/dL — AB (ref 6–20)
CALCIUM: 9.7 mg/dL (ref 8.9–10.3)
CO2: 26 mmol/L (ref 22–32)
Chloride: 98 mmol/L — ABNORMAL LOW (ref 101–111)
Creatinine, Ser: 0.77 mg/dL (ref 0.44–1.00)
GFR calc Af Amer: >60 mL/min (ref >60)
GLUCOSE: 105 mg/dL — AB (ref 65–99)
Potassium: 5.1 mmol/L (ref 3.5–5.1)
SODIUM: 131 mmol/L — AB (ref 135–145)

## 2016-04-30 LAB — CBC
HCT: 37.6 % (ref 36.0–46.0)
HEMOGLOBIN: 12.6 g/dL (ref 12.0–15.0)
MCH: 32 pg (ref 26.0–34.0)
MCHC: 33.5 g/dL (ref 30.0–36.0)
MCV: 95.4 fL (ref 78.0–100.0)
PLATELETS: 204 10*3/uL (ref 150–400)
RBC: 3.94 MIL/uL (ref 3.87–5.11)
RDW: 12.8 % (ref 11.5–15.5)
WBC: 6.6 10*3/uL (ref 4.0–10.5)

## 2016-04-30 LAB — I-STAT TROPONIN, ED: TROPONIN I, POC: 0 ng/mL (ref 0.00–0.08)

## 2016-04-30 MED ORDER — PROGESTERONE MICRONIZED 200 MG PO CAPS: 200.0000 mg | ORAL_CAPSULE | Freq: Every day | ORAL | Status: DC

## 2016-04-30 MED ORDER — ESTRADIOL 0.1 MG/24HR TD PTTW: 1.0000 | MEDICATED_PATCH | TRANSDERMAL | Status: DC

## 2016-04-30 NOTE — ED Notes (Signed)
Pt states about 45 minutes ago before she started her physical therapy on her right shoulder she started having a palpations and her "fit bit watch said her heart rate was 130's". Pt also reports high blood pressure there [id number] systolic. Pt states she stopped taking her atenolol 3 days ago for low BP. Pt took atenolol before coming to ed. B/p 150/82. Hr 86.

## 2016-04-30 NOTE — Progress Notes (Signed)
Amanda Wilkins September 17, 1946 [id number]   History:    70 y.o.  for annual gyn exam with worsening hot flashes when she has 6 day today she can't sleep weeks for up at night.Patient had previously been for many years on Premarin 0.3 mg daily with the addition of Provera 10 mg for 10 days of the month. She's having no menstrual bleeding. She had been on HRT for 10 years and is a result of the women's health initiative study in counseling she attempted this past year to come off completely she tried alternative measures such as Relizen and peppermint oil  to no avail. She would like to go back on a low-dose hormone again.  Her PCP is Dr. Alain Marion who is been treating her for hypertension and has been doing her blood work. Dr. wall has been following her for her atrial fibrillation. Patient had a negative colonoscopy in 2016.. In April of 2016 patient was hospitalized as a result of septicemia. See discharge summary dated 02/09/2015 for details. Patient had a normal bone density study in 2016. Normal colonoscopy 2012.  Past medical history,surgical history, family history and social history were all reviewed and documented in the EPIC chart.  Gynecologic History No LMP recorded. Patient is postmenopausal. Contraception: post menopausal status Last Pa2012 Results were: normal Last mammogra2016 Results were: normal  Obstetric History OB History  Gravida Para Term Preterm AB SAB TAB Ectopic Multiple Living  2 2 2       2     # Outcome Date GA Lbr Len/2nd Weight Sex Delivery Anes PTL Lv  2 Term     M Vag-Spont  N Y  1 Term     M CS-Unspec  N Y       ROS: A ROS was performed and pertinent positives and negatives are included in the history.  GENERAL: No fevers or chills. HEENT: No change in vision, no earache, sore throat or sinus congestion. NECK: No pain or stiffness. CARDIOVASCULAR: No chest pain or pressure. No palpitations. PULMONARY: No shortness of breath, cough or wheeze.  GASTROINTESTINAL: No abdominal pain, nausea, vomiting or diarrhea, melena or bright red blood per rectum. GENITOURINARY: No urinary frequency, urgency, hesitancy or dysuria. MUSCULOSKELETAL: No joint or muscle pain, no back pain, no recent trauma. DERMATOLOGIC: No rash, no itching, no lesions. ENDOCRINE: No polyuria, polydipsia, no heat or cold intolerance. No recent change in weight. HEMATOLOGICAL: No anemia or easy bruising or bleeding. NEUROLOGIC: No headache, seizures, numbness, tingling or weakness. PSYCHIATRIC: No depression, no loss of interest in normal activity or change in sleep pattern.     Exam: chaperone present  BP 132/84 mmHg  Ht 5\' 5"  (1.651 m)  Wt 217 lb (98.431 kg)  BMI 36.11 kg/m2  Body mass index is 36.11 kg/(m^2).  General appearance : Well developed well nourished female. No acute distress HEENT: Eyes: no retinal hemorrhage or exudates,  Neck supple, trachea midline, no carotid bruits, no thyroidmegaly Lungs: Clear to auscultation, no rhonchi or wheezes, or rib retractions  Heart: Regular rate and rhythm, no murmurs or gallops Breast:Examined in sitting and supine position were symmetrical in appearance, no palpable masses or tenderness,  no skin retraction, no nipple inversion, no nipple discharge, no skin discoloration, no axillary or supraclavicular lymphadenopathy Abdomen: no palpable masses or tenderness, no rebound or guarding Extremities: no edema or skin discoloration or tenderness  Pelvic:  Bartholin, Urethra, Skene Glands: Within normal limits  Vagina: No gross lesions or discharge, atrophic changes Cervix: No gross lesions or discharge  Uteruanteverted, normal size, shape and consistency, non-tender and mobile  Adnexa  Without masses or tenderness  Anus and perineum  normal   Rectovaginal  normal sphincter tone without palpated masses or tenderness             Hemoccult PCP provides   Asessment/Plan:  70 y.o. female for annual exam with  worsening vasomotor symptoms and she cream off of HRT last year. Patient would like to go back on hormone replacement therapy. We once again discussed the women's health initiative study to include the risk of breast cancer, DVT and pulmonary embolism. She is continue to exercise and has lost close to 20 pounds in the past year. We discussed placing on transdermal estrogen patches 0.1 mg twice a week with the addition of Prometrium 200 mg for 12 days a month. She was encouraged to continue to monthly breast exam. She fully accepts the risk of going back on the hormone replacement therapy but she states she cannot continue to live with so many hot flashes which are interrupting her sleep as well. Her PCP has been doing her blood work.    Terrance Mass MD, 11:37 AM 04/30/2016

## 2016-04-30 NOTE — Discharge Instructions (Signed)
Continue taking her home medications as prescribed. Follow-up with your family doctor that the next week. Please return to the Emergency Department if symptoms worsen or new onset of fever, chest pain, shortness of breath, lightheadedness, dizziness, palpitations, syncope, vomiting, abdominal pain, numbness, tingling, weakness.

## 2016-04-30 NOTE — ED Provider Notes (Signed)
CSN: [id number]     Arrival date & time 04/30/16  1454 History   First MD Initiated Contact with Patient 04/30/16 2139     Chief Complaint  Patient presents with  . Palpitations     (Consider location/radiation/quality/duration/timing/severity/associated sxs/prior Treatment) HPI   Patient is a 70 year old female with past medical history of A. fib (on 0 to), and hypertension who presents the ED with complaint of palpitations. She notes while she was sitting having her shoulder ice at physical therapy she began to feel like her heart was racing. She notes her eye watch reported her heart rate at 1:30. Patient notes she has been part of a weight loss program and she has not been taking her atenolol for the past 3 days due to having multiple reported episodes of low blood pressure from weight loss. Pt was advised by her doctor to d/c her BP meds for now with continued outpatient monitoring. She notes her blood pressure was in the [id number] systolic at the PT clinic. Patient reports taking one of her atenolol pills a few minutes after onset of palpitations. She notes once arrival to the ED her palpitations had resolved and her blood pressure was no longer elevated. Denies fever, chills, headache, visual changes, lightheadedness, dizziness, chest pain, shortness of breath, cough, wheezing, abdominal pain, nausea, vomiting, diaphoresis, weakness. Patient reports since arrival to the ED she has not had any complaints or symptoms.  Past Medical History  Diagnosis Date  . Allergy   . Atrial fibrillation ([place])   . Migraines     on Zoloft for migraines  . Hypertension   . Chronic interstitial nephritis   . Depression   . Palpitations   . Obesity   . Arthritis     ON BOTH KNEES   Past Surgical History  Procedure Laterality Date  . Cesarean section      x 1  . Dental surgery      multiple implants  . Repair of torn meniscus on left 08/2014 Left 08/2015    Dr. Ronnie Derby at [facility]  .  Esophagogastroduodenoscopy N/A 02/08/2015    Procedure: ESOPHAGOGASTRODUODENOSCOPY (EGD);  Surgeon: [name], MD;  Location: [facility];  Service: Endoscopy;  Laterality: N/A;  . Shoulder surgery Right April 04 2016   Family History  Problem Relation Age of Onset  . Pancreatic cancer Brother   . Lymphoma Mother   . Prostate cancer Father     metastatic prostate cancer   Social History  Substance Use Topics  . Smoking status: Never Smoker   . Smokeless tobacco: Never Used     Comment: H/O social smoking at times when drinking--never a "habit"  . Alcohol Use: 0.0 oz/week    0 Standard drinks or equivalent per week     Comment: WINE- TWO GLASSES AT [employer]   OB History    Gravida Para Term Preterm AB TAB SAB Ectopic Multiple Living   2 2 2       2      Review of Systems  Cardiovascular: Positive for palpitations.  All other systems reviewed and are negative.     Allergies  Epinephrine base; Aspirin; Penicillins; and Tape  Home Medications   Prior to Admission medications   Medication Sig Start Date End Date Taking? Authorizing Provider  acetaminophen (TYLENOL) 650 MG CR tablet Take 650 mg by mouth every 8 (eight) hours as needed for pain.    Historical Provider, MD  atenolol (TENORMIN) 50 MG tablet Take 0.5 tablets (25  mg total) by mouth daily. Patient not taking: Reported on 04/30/2016 02/21/16   Fay Records, MD  Cholecalciferol (VITAMIN D) 2000 UNITS tablet Take 2,000 Units by mouth daily.    Historical Provider, MD  estradiol (VIVELLE-DOT) 0.1 MG/24HR patch Place 1 patch (0.1 mg total) onto the skin 2 (two) times a week. 04/30/16   Terrance Mass, MD  hydroxychloroquine (PLAQUENIL) 200 MG tablet Take 200 mg by mouth daily. Reported on 02/27/2016 07/13/15   Historical Provider, MD  Multiple Vitamin (MULTIVITAMIN) capsule Take 1 capsule by mouth daily.    Historical Provider, MD  progesterone (PROMETRIUM) 200 MG capsule Take 1 capsule (200 mg total) by mouth daily.  04/30/16   Terrance Mass, MD  rivaroxaban (XARELTO) 20 MG TABS tablet Take 1 tablet (20 mg total) by mouth daily with supper. 03/05/16   Fay Records, MD  rosuvastatin (CRESTOR) 5 MG tablet Take 1 tablet (5 mg total) by mouth daily. 08/30/15   [name], MD  sertraline (ZOLOFT) 50 MG tablet Take 2 tablets daily 08/04/15   Cameron Sprang, MD   BP 123/73 mmHg  Pulse 65  Temp(Src) 98.5 F (36.9 C) (Oral)  Resp 17  Ht 5\' 5"  (1.651 m)  Wt 98.431 kg  BMI 36.11 kg/m2  SpO2 100% Physical Exam  Constitutional: She is oriented to person, place, and time. She appears well-developed and well-nourished. No distress.  HENT:  Head: Normocephalic and atraumatic.  Mouth/Throat: Oropharynx is clear and moist. No oropharyngeal exudate.  Eyes: Conjunctivae and EOM are normal. Right eye exhibits no discharge. Left eye exhibits no discharge. No scleral icterus.  Neck: Normal range of motion. Neck supple.  Cardiovascular: Normal rate, regular rhythm, normal heart sounds and intact distal pulses.   HR 65  Pulmonary/Chest: Effort normal and breath sounds normal. No respiratory distress. She has no wheezes. She has no rales. She exhibits no tenderness.  Abdominal: Soft. Bowel sounds are normal. She exhibits no distension and no mass. There is no tenderness. There is no rebound and no guarding.  Musculoskeletal: Normal range of motion. She exhibits no edema.  Lymphadenopathy:    She has no cervical adenopathy.  Neurological: She is alert and oriented to person, place, and time.  Skin: Skin is warm and dry. She is not diaphoretic.  Nursing note and vitals reviewed.   ED Course  Procedures (including critical care time) Labs Review Labs Reviewed  BASIC METABOLIC PANEL - Abnormal; Notable for the following:    Sodium 131 (*)    Chloride 98 (*)    Glucose, Bld 105 (*)    BUN 21 (*)    All other components within normal limits  CBC  I-STAT TROPOININ, ED    Imaging Review Dg Chest 2  View  04/30/2016  CLINICAL DATA:  Tachycardia. The patient reports she has not taken her medicine for hypertension for 3 days. EXAM: CHEST  2 VIEW COMPARISON:  PA and lateral chest 05/27/2015. FINDINGS: The lungs are clear. Heart size is normal. No pneumothorax or pleural effusion. Aortic atherosclerosis is noted. No focal bony abnormality. IMPRESSION: No acute disease. Atherosclerosis. Electronically Signed   By: Inge Rise M.D.   On: 04/30/2016 15:56   I have personally reviewed and evaluated these images and lab results as part of my medical decision-making.   EKG Interpretation   Date/Time:  Monday April 30 2016 15:00:45 EDT Ventricular Rate:  86 PR Interval:  152 QRS Duration: 80 QT Interval:  352 QTC Calculation:  421 R Axis:   39 Text Interpretation:  Normal sinus rhythm Normal ECG Confirmed by LIU MD,  DANA [phone]) on 04/30/2016 10:10:33 PM      MDM   Final diagnoses:  Palpitations    Patient presents with episode of palpitations that started while she was sitting getting chemotherapy at physical therapy. Patient has not been taking her atenolol for the past 3 days due to reported decrease in blood pressure with weight loss. Patient took one dose of her atenolol a few minutes after onset of symptoms and notes her symptoms resolved approximately 15-20 minutes later. History of A. fib, on Xarelto. VSS. Exam unremarkable. EKG showed normal sinus rhythm. Troponin negative. Chest x-ray negative. Remaining labs unremarkable. I suspect patient likely had a brief episode of atrial fibrillation that resolved after taking atenolol. Patient has remained hemodynamically stable while in the ED, heart rate 65-83, no episodes of tachycardia while in the ED. discussed results and plan for discharge with patient. Plan to discharge patient home with outpatient cardiology follow-up. Discussed return precautions with patient.    Chesley Noon [place], [place] 04/30/16 Benbrook Liu,  MD 05/01/16 [phone]

## 2016-04-30 NOTE — ED Provider Notes (Signed)
Medical screening examination/treatment/procedure(s) were conducted as a shared visit with non-physician practitioner(s) and myself.  I personally evaluated the patient during the encounter.   EKG Interpretation   Date/Time:  Monday April 30 2016 15:00:45 EDT Ventricular Rate:  86 PR Interval:  152 QRS Duration: 80 QT Interval:  352 QTC Calculation: 421 R Axis:   39 Text Interpretation:  Normal sinus rhythm Normal ECG Confirmed by Taariq Leitz MD,  Hinton Dyer [id number]) on 04/30/2016 10:10:10 PM      71 year old female who presents with palpitations. She has a history of paroxysmal atrial fibrillation on Xarelto. States that she has been undergoing a weight loss program with improvement in her blood pressure recently and has been tapering down on her atenolol by direction of her physicians. She has been told to stop her atenolol 3 days ago. States that she has otherwise been in her usual state of health. Prior to physical therapy today while icing her right shoulder she developed sudden onset of palpitations. Her watch had measured her heart rate ranging between 128-139. She took a dose of her atenolol, and subsequently had normalization of her heart rate. She was brought to the ED for evaluation. No chest pain, shortness of breath, lightheadedness or syncope, lower extremity edema, nausea or vomiting, or diaphoresis. No recent illnesses including cough, fevers or chills, vomiting or diarrhea. On presentation is asymptomatic. She is in normal sinus rhythm. She is normotensive. No major electrolyte or metabolic derangements. Suspect that she had brief episode of atrial fibrillation that converted after taking atenolol. She will follow-up with her cardiologist as outpatient. Strict return and follow-up instructions are reviewed. She expressed understanding of all discharge instructions, and felt comfortable with the plan of care.  Forde Dandy, MD 04/30/16 [phone]

## 2016-04-30 NOTE — Patient Instructions (Signed)
Progesterone capsules What is this medicine? PROGESTERONE (proe JES ter one) is a female hormone. This medicine is used to prevent the overgrowth of the lining of the uterus in women who are taking estrogens for the symptoms of menopause. It is also used to treat secondary amenorrhea. This is when a woman stops getting menstrual periods due to low levels of progesterone. This medicine may be used for other purposes; ask your health care provider or pharmacist if you have questions. What should I tell my health care provider before I take this medicine? They need to know if you have any of these conditions: -autoimmune disease like systemic lupus erythematosus (SLE) -blood vessel disease, blood clotting disorder, or suffered a stroke -breast, cervical or vaginal cancer -dementia -diabetes -kidney or liver disease -heart disease, high blood pressure or recent heart attack -high blood lipids or cholesterol -hysterectomy -recent miscarriage -tobacco smoker -vaginal bleeding -an unusual or allergic reaction to progesterone, peanuts, other medicines, foods, dyes, or preservatives -pregnant or trying to get pregnant -breast-feeding How should I use this medicine? Take this medicine by mouth with a glass of water. Follow the directions on the prescription label. Take your doses at regular intervals. Do not take your medicine more often than directed. Talk to your pediatrician regarding the use of this medicine in children. Special care may be needed. A patient package insert for the product will be given with each prescription and refill. Read this sheet carefully each time. The sheet may change frequently. Overdosage: If you think you have taken too much of this medicine contact a poison control center or emergency room at once. NOTE: This medicine is only for you. Do not share this medicine with others. What if I miss a dose? If you miss a dose, take it as soon as you can. If it is almost time  for your next dose, take only that dose. Do not take double or extra doses. What may interact with this medicine? Do not take this medicine with any of the following medications: -bosentan This medicine may also interact with the following medications: -barbiturate medicines for sleep or seizures -bexarotene -carbamazepine -ethotoin -ketoconazole -phenytoin -rifampin This list may not describe all possible interactions. Give your health care provider a list of all the medicines, herbs, non-prescription drugs, or dietary supplements you use. Also tell them if you smoke, drink alcohol, or use illegal drugs. Some items may interact with your medicine. What should I watch for while using this medicine? Visit your doctor or health care professional for regular checks on your progress. This medicine can cause swelling, tenderness, or bleeding of the gums. Be careful when brushing and flossing teeth. See your dentist regularly for routine dental care. You may get drowsy or dizzy. Do not drive, use machinery, or do anything that needs mental alertness until you know how this drug affects you. Do not stand or sit up quickly, especially if you are an older patient. This reduces the risk of dizzy or fainting spells. What side effects may I notice from receiving this medicine? Side effects that you should report to your doctor or health care professional as soon as possible: -allergic reactions like skin rash, itching or hives, swelling of the face, lips, or tongue -breast tissue changes or discharge -changes in vaginal bleeding during your period or between your periods -depression -muscle or bone pain -numbness or pain in the arm or leg -pain in the chest, groin or leg -seizures or tremors -severe headache -stomach pain -sudden  shortness of breath -unusually weak or tired -vision or speech problems -yellowing of skin or eyes Side effects that usually do not require medical attention (report to  your doctor or health care professional if they continue or are bothersome): -acne -fluid retention and swelling -increased in appetite -mood changes, anxiety, depression, frustration, anger, or emotional outbursts -nausea, vomiting -sweating or hot flashes This list may not describe all possible side effects. Call your doctor for medical advice about side effects. You may report side effects to FDA at 1-800-FDA-1088. Where should I keep my medicine? Keep out of the reach of children. Store at room temperature between 15 and 30 degrees C (59 and 86 degrees F). Protect from light. Keep container tightly closed. Throw away any unused medicine after the expiration date. NOTE: This sheet is a summary. It may not cover all possible information. If you have questions about this medicine, talk to your doctor, pharmacist, or health care provider.    2016, Elsevier/Gold Standard. (2008-09-16 11:43:48) Estradiol skin patches What is this medicine? ESTRADIOL (es tra DYE ole) skin patches contain an estrogen. It is mostly used as hormone replacement in menopausal women. It helps to treat hot flashes and prevent osteoporosis. It is also used to treat women with low estrogen levels or those who have had their ovaries removed. This medicine may be used for other purposes; ask your health care provider or pharmacist if you have questions. What should I tell my health care provider before I take this medicine? They need to know if you have any of these conditions: -abnormal vaginal bleeding -blood vessel disease or blood clots -breast, cervical, endometrial, ovarian, liver, or uterine cancer -dementia -diabetes -gallbladder disease -heart disease or recent heart attack -high blood pressure -high cholesterol -high level of calcium in the blood -hysterectomy -kidney disease -liver disease -migraine headaches -protein C deficiency -protein S deficiency -stroke -systemic lupus erythematosus  (SLE) -tobacco smoker -an unusual or allergic reaction to estrogens, other hormones, medicines, foods, dyes, or preservatives -pregnant or trying to get pregnant -breast-feeding How should I use this medicine? This medicine is for external use only. Follow the directions on the prescription label. Tear open the pouch, do not use scissors. Remove the stiff protective liner covering the adhesive. Try not to touch the adhesive. Apply the patch, sticky side to the skin, to an area that is clean, dry and hairless. Avoid injured, irritated, calloused, or scarred areas. Do not apply the skin patches to your breasts or around the waistline. Use a different site each time to prevent skin irritation. Do not cut or trim the patch. Do not stop using except on the advice of your doctor or health care professional. Do not wear more than one patch at a time unless you are told to do so by your doctor or health care professional. Contact your pediatrician regarding the use of this medicine in children. Special care may be needed. A patient package insert for the product will be given with each prescription and refill. Read this sheet carefully each time. The sheet may change frequently. Overdosage: If you think you have taken too much of this medicine contact a poison control center or emergency room at once. NOTE: This medicine is only for you. Do not share this medicine with others. What if I miss a dose? If you miss a dose, apply it as soon as you can. If it is almost time for your next dose, apply only that dose. Do not apply double or extra  doses. What may interact with this medicine? Do not take this medicine with any of the following medications: -aromatase inhibitors like aminoglutethimide, anastrozole, exemestane, letrozole, testolactone This medicine may also interact with the following medications: -carbamazepine -certain antibiotics used to treat infections -certain barbiturates used for inducing  sleep or treating seizures -grapefruit juice -medicines for fungus infections like itraconazole and ketoconazole -raloxifene or tamoxifen -rifabutin, rifampin, or rifapentine -ritonavir -St. John's Wort This list may not describe all possible interactions. Give your health care provider a list of all the medicines, herbs, non-prescription drugs, or dietary supplements you use. Also tell them if you smoke, drink alcohol, or use illegal drugs. Some items may interact with your medicine. What should I watch for while using this medicine? Visit your doctor or health care professional for regular checks on your progress. You will need a regular breast and pelvic exam and Pap smear while on this medicine. You should also discuss the need for regular mammograms with your health care professional, and follow his or her guidelines for these tests. This medicine can make your body retain fluid, making your fingers, hands, or ankles swell. Your blood pressure can go up. Contact your doctor or health care professional if you feel you are retaining fluid. If you have any reason to think you are pregnant, stop taking this medicine right away and contact your doctor or health care professional. Smoking increases the risk of getting a blood clot or having a stroke while you are taking this medicine, especially if you are more than 69 years old. You are strongly advised not to smoke. If you wear contact lenses and notice visual changes, or if the lenses begin to feel uncomfortable, consult your eye doctor or health care professional. This medicine can increase the risk of developing a condition (endometrial hyperplasia) that may lead to cancer of the lining of the uterus. Taking progestins, another hormone drug, with this medicine lowers the risk of developing this condition. Therefore, if your uterus has not been removed (by a hysterectomy), your doctor may prescribe a progestin for you to take together with your  estrogen. You should know, however, that taking estrogens with progestins may have additional health risks. You should discuss the use of estrogens and progestins with your health care professional to determine the benefits and risks for you. If you are going to have surgery or an MRI, you may need to stop taking this medicine. Consult your health care professional for advice before you schedule the surgery. You may bathe or participate in other activities while wearing your patch. If the patch pulls loose or falls off, you may reapply it if the patch is sticky enough to stay on the skin. You should reapply the patch in a different area. Use a fresh patch if it will no longer stick. What side effects may I notice from receiving this medicine? Side effects that you should report to your doctor or health care professional as soon as possible: -allergic reactions like skin rash, itching or hives, swelling of the face, lips, or tongue -breast tissue changes or discharge -changes in vision -chest pain -confusion, trouble speaking or understanding -dark urine -general ill feeling or flu-like symptoms -light-colored stools -nausea, vomiting -pain, swelling, warmth in the leg -right upper belly pain -severe headaches -shortness of breath -sudden numbness or weakness of the face, arm or leg -trouble walking, dizziness, loss of balance or coordination -unusual vaginal bleeding -yellowing of the eyes or skin Side effects that usually do not  require medical attention (report to your doctor or health care professional if they continue or are bothersome): -hair loss -increased hunger or thirst -increased urination -symptoms of vaginal infection like itching, irritation or unusual discharge -unusually weak or tired This list may not describe all possible side effects. Call your doctor for medical advice about side effects. You may report side effects to FDA at 1-800-FDA-1088. Where should I keep my  medicine? Keep out of the reach of children. Store at room temperature below 30 degrees C (86 degrees F). Do not store any patches that have been removed from their protective pouch. Throw away any unused medicine after the expiration date. Dispose of used patches properly. Since used patches may still contain active hormones, fold the patch in half so that it sticks to itself prior to disposal. NOTE: This sheet is a summary. It may not cover all possible information. If you have questions about this medicine, talk to your doctor, pharmacist, or health care provider.    2016, Elsevier/Gold Standard. (2011-01-03 09:19:41)

## 2016-05-01 ENCOUNTER — Telehealth: Payer: Self-pay | Admitting: Internal Medicine

## 2016-05-01 ENCOUNTER — Ambulatory Visit
Admission: RE | Admit: 2016-05-01 | Discharge: 2016-05-01 | Disposition: A | Discharge status: Home / Self Care | Payer: Medicare Other | Source: Ambulatory Visit | Attending: Gynecology | Admitting: Gynecology

## 2016-05-01 DIAGNOSIS — Z1231 Encounter for screening mammogram for malignant neoplasm of breast: Secondary | ICD-10-CM | POA: Diagnosis not present

## 2016-05-01 NOTE — Telephone Encounter (Signed)
New Message  Pt c/o BP issue: STAT if pt c/o blurred vision, one-sided weakness or slurred speech  1. What are your last 5 BP readings? high 190/110  low 90/69, 90/59  2. Are you having any other symptoms (ex. Dizziness, headache, blurred vision, passed out)?   3. What is your BP issue? Pt call requesting to speak with RN. Pt states she went to the hospital on 7/17 for high bp. Pt states bp was increasing really high and when it decrease it went really low. Pt has pt schedule with APP, but pt wants to schedule a sooner appt with Dr. [name]. Please call back to discuss

## 2016-05-01 NOTE — Telephone Encounter (Signed)
Returned call to patient and she states she has been going to a weight loss program at [facility] and they recommended she decrease her Atenolol to 25 mg tablet due to low BP and then stopped due to low BP 90/60 but she felt great.  Then when she went to PT and had an episode where her BP was 190/110 HR 110. She went to the ER as she was fearful she was in afib.  On the away to the ER she took 50 mg of Atenolol and within 30 min it was back to normal.  When she was triaged all her VS were normal.  She is going to resume the 25 mg daily of Atenolol until her OV.  She has an appointment with the PA but really wants to seeDr [name].  I let her know I would forward to Dr [name] and her nurse.  She is aware Dr [name] is out this week.

## 2016-05-02 DIAGNOSIS — R29898 Other symptoms and signs involving the musculoskeletal system: Secondary | ICD-10-CM | POA: Diagnosis not present

## 2016-05-02 DIAGNOSIS — M25611 Stiffness of right shoulder, not elsewhere classified: Secondary | ICD-10-CM | POA: Diagnosis not present

## 2016-05-02 DIAGNOSIS — M7541 Impingement syndrome of right shoulder: Secondary | ICD-10-CM | POA: Diagnosis not present

## 2016-05-02 DIAGNOSIS — M25511 Pain in right shoulder: Secondary | ICD-10-CM | POA: Diagnosis not present

## 2016-05-02 NOTE — Telephone Encounter (Signed)
I am DOD with full clinic sched next wk  Could add on for BP / HR check   Bring records of BPs and meds

## 2016-05-02 NOTE — Telephone Encounter (Signed)
Call home number.  Left detailed message to check and record BP and HR at home for now. Advised to call back to schedule a BP check.  I will try to scheduled on 7/27 when Dr. [name] is DOD.  She should bring meds and a list of readings.

## 2016-05-03 ENCOUNTER — Telehealth: Payer: Self-pay | Admitting: Internal Medicine

## 2016-05-03 NOTE — Telephone Encounter (Signed)
error 

## 2016-05-04 NOTE — Telephone Encounter (Signed)
Follow-up ° ° ° ° °The pt is returning the nurses call °

## 2016-05-04 NOTE — Telephone Encounter (Signed)
Had message that patient called.  She continues on the atenolol 25 mg daily. Her BPs have been running 99/70, 108/71, 116/64 with HRs 70-80. Feels fine except has to stand for a minute to get her bearings before walking after sitting for a period of time.  This is not new.  Is worried that if she stops atenolol altogether again that she may go into afib.  Advised to continue to check BP, HR at home and call with significant changes. Otherwise stay on atenolol 25 mg daily until her appointment with APP on 05/15/16. Pt verbalizes agreement with this plan.

## 2016-05-04 NOTE — Addendum Note (Signed)
Addended by: Rodman Key on: 05/04/2016 11:32 AM   Modules accepted: Medications

## 2016-05-07 DIAGNOSIS — M25611 Stiffness of right shoulder, not elsewhere classified: Secondary | ICD-10-CM | POA: Diagnosis not present

## 2016-05-07 DIAGNOSIS — M25511 Pain in right shoulder: Secondary | ICD-10-CM | POA: Diagnosis not present

## 2016-05-07 DIAGNOSIS — R29898 Other symptoms and signs involving the musculoskeletal system: Secondary | ICD-10-CM | POA: Diagnosis not present

## 2016-05-07 DIAGNOSIS — M7541 Impingement syndrome of right shoulder: Secondary | ICD-10-CM | POA: Diagnosis not present

## 2016-05-09 DIAGNOSIS — M25511 Pain in right shoulder: Secondary | ICD-10-CM | POA: Diagnosis not present

## 2016-05-09 DIAGNOSIS — M25611 Stiffness of right shoulder, not elsewhere classified: Secondary | ICD-10-CM | POA: Diagnosis not present

## 2016-05-09 DIAGNOSIS — R29898 Other symptoms and signs involving the musculoskeletal system: Secondary | ICD-10-CM | POA: Diagnosis not present

## 2016-05-09 DIAGNOSIS — M7541 Impingement syndrome of right shoulder: Secondary | ICD-10-CM | POA: Diagnosis not present

## 2016-05-15 ENCOUNTER — Ambulatory Visit (INDEPENDENT_AMBULATORY_CARE_PROVIDER_SITE_OTHER): Payer: Medicare Other | Admitting: Cardiology

## 2016-05-15 ENCOUNTER — Encounter: Payer: Self-pay | Admitting: Cardiology

## 2016-05-15 VITALS — BP 110/72 | HR 72 | Ht 65.0 in | Wt 215.1 lb

## 2016-05-15 DIAGNOSIS — R55 Syncope and collapse: Secondary | ICD-10-CM

## 2016-05-15 NOTE — Progress Notes (Addendum)
05/15/2016 Amanda Wilkins   [birth year]  [id number]  Primary Physician [name], MD Primary Cardiologist: Dr. [name]   Reason for Visit/CC: dizziness, near syncope  HPI:  70 y.o. female with a history of CP and PAF. She is on chronic anticoagulation with Xarelto. 2D Echo 01/2015 was normal with EF of 55-60%. Wall motion was normal. No significant valve abnormalities. Myoview 12/2014 with mod defect at base and mid inferolateral and inferior wall, consistent with ischemia, however insurance declined CT angio. She denies any recent CP. no dyspnea. She notes that she had a carotid doppler study 1 years ago, ordered by a neurologist. She was told she had only mild plaque. Over the years she has had issues with unexplained dizziness and near syncope. She feels this may be related to BP. 2 weeks ago, she tried stopping her atenolol and ended up with tachypalpiations and had to go to ED. W/u was unremarkable. She was restarted on her atenolol and palpitations resolved.   She reports to clinic today with a complaint of recurrent dizziness and near syncope that occurred yesterday while at home. She was outside in the yard reading a newspaper. She had been sitting for approximately 20 minutes. Immediately after standing she felt dizzy/lightheaded. He walked through the doors of her home and developed symptoms of near syncope. She lowered herself to the ground. It was no loss of consciousness. She denies any palpitations. No chest discomfort or dyspnea. After a few minutes her dizziness resolved.  Today in clinic, she reports that she feels better. Her blood pressure is 110/70. Heart rate is 72 bpm. Physical exam reveals regular rate and rhythm. No cardiac murmurs. No carotid bruits. Sure reports good PO intake and has been staying well hydrated with fluids. She is on a supervised diet program at [facility] but has 3 light meals a day. Recent laboratory work 2 weeks ago showed normal CBC and normal basic  metabolic panel. No anemia. Orthostatic vital signss were checked in clinic today and she is not orthostatic.     Current Outpatient Prescriptions  Medication Sig Dispense Refill  . acetaminophen (TYLENOL) 650 MG CR tablet Take 650 mg by mouth every 8 (eight) hours as needed for pain.    [name] atenolol (TENORMIN) 50 MG tablet Take 0.5 tablets (25 mg total) by mouth daily. 90 tablet 3  . Cholecalciferol (VITAMIN D) 2000 UNITS tablet Take 2,000 Units by mouth daily.    [name] estradiol (VIVELLE-DOT) 0.1 MG/24HR patch Place 1 patch (0.1 mg total) onto the skin 2 (two) times a week. 8 patch 12  . hydroxychloroquine (PLAQUENIL) 200 MG tablet Take 200 mg by mouth daily. Reported on 02/27/2016  3  . Multiple Vitamin (MULTIVITAMIN) capsule Take 1 capsule by mouth daily.    . progesterone (PROMETRIUM) 200 MG capsule Take 1 capsule (200 mg total) by mouth daily. 36 capsule 4  . rivaroxaban (XARELTO) 20 MG TABS tablet Take 1 tablet (20 mg total) by mouth daily with supper. 30 tablet 6  . rosuvastatin (CRESTOR) 5 MG tablet Take 1 tablet (5 mg total) by mouth daily. 30 tablet 11  . sertraline (ZOLOFT) 50 MG tablet Take 2 tablets daily 180 tablet 3   No current facility-administered medications for this visit.     Allergies  Allergen Reactions  . Epinephrine Base Other (See Comments)    Seriously increases heart rate  . Aspirin Other (See Comments)    Can take the coated 325 mg. Plain asa 325 mg speeds up the  heart.  . Penicillins Other (See Comments)    Pt previously has been told not to take because of family history of reactions (water blisters). She took amoxicillin in 2012-2013 with no reaction  . Tape Other (See Comments)    Blisters from adhesive tape    Social History   Social History  . Marital status: Married    Spouse name: N/A  . Number of children: 2  . Years of education: N/A   Occupational History  . 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 Smoking status: Never Smoker  . Smokeless tobacco: Never Used     Comment: H/O social smoking at times when drinking--never a "habit"  . Alcohol use 0.0 oz/week     Comment: WINE- [employer] AT [employer]  . Drug use: No  . Sexual activity: Yes     Comment: intercourse age 42, sexual partners more than 5   Other Topics Concern  . Not on file   Social History Narrative  . No narrative on file     Review of Systems: General: negative for chills, fever, night sweats or weight changes.  Cardiovascular: negative for chest pain, dyspnea on exertion, edema, orthopnea, palpitations, paroxysmal nocturnal dyspnea or shortness of breath Dermatological: negative for rash Respiratory: negative for cough or wheezing Urologic: negative for hematuria Abdominal: negative for nausea, vomiting, diarrhea, bright red blood per rectum, melena, or hematemesis Neurologic: negative for visual changes, syncope, or dizziness All other systems reviewed and are otherwise negative except as noted above.    Blood pressure 110/72, pulse 72, height 5\' 5"  (1.651 m), weight 215 lb 1.9 oz (97.6 kg), SpO2 98 %.  General appearance: alert, cooperative and no distress Neck: no carotid bruit and no JVD Lungs: clear to auscultation bilaterally Heart: regular rate and rhythm, S1, S2 normal, no murmur, click, rub or gallop Extremities: no LEE Pulses: 2+ and symmetric Skin: warm and dry Neurologic: Grossly normal    ASSESSMENT AND PLAN:   1. Dizziness/ Near Syncope:  Occurred once yesterday after standing up, after a period of prolonged sitting. She has a long history of occasional dizziness and near syncope. No specific etiology has been found. She is currently asymptomatic in clinic. BP is stable at 110/72. Physical exam reveals RRR. Orthostatic vital signs were checked in clinic today. The patient is not orthostatic. Her symptoms do not occur everyday. Only with positional changes from sitting to standing, if  after prolonged periods of sitting. ? Vasovagal syncope. Patient advised to take her time with positional changes and to stay well hydrated. She was advised to continue her atenolol (currently on low dose), as she has h/o atrial fibrillation and did not tolerate discontinuation of BB in the past. She is w/o CP. No dyspnea. Recent labs from ED reviewed. Renal function did not suggest dehydration and CBC was negative for anemia. Na was low however at 131. She is not on a diuretic. Patient advised to f/u with her PCP for this. No plans for further w/u at this point. Patient in agreement. Continue routine f/u with Dr. [name].     Lyda Jester PA-C 05/15/2016 4:08 PM

## 2016-05-15 NOTE — Patient Instructions (Signed)
Medication Instructions:  Your physician recommends that you continue on your current medications as directed. Please refer to the Current Medication list given to you today.   Labwork: None ordered  Testing/Procedures: None ordered  Follow-Up: Your physician recommends that you schedule a follow-up appointment in: SEE DR. ROSS AS PLANNED   Any Other Special Instructions Will Be Listed Below (If Applicable).     If you need a refill on your cardiac medications before your next appointment, please call your pharmacy.  3

## 2016-05-17 DIAGNOSIS — M25511 Pain in right shoulder: Secondary | ICD-10-CM | POA: Diagnosis not present

## 2016-05-17 DIAGNOSIS — M25611 Stiffness of right shoulder, not elsewhere classified: Secondary | ICD-10-CM | POA: Diagnosis not present

## 2016-05-17 DIAGNOSIS — R29898 Other symptoms and signs involving the musculoskeletal system: Secondary | ICD-10-CM | POA: Diagnosis not present

## 2016-05-17 DIAGNOSIS — M7541 Impingement syndrome of right shoulder: Secondary | ICD-10-CM | POA: Diagnosis not present

## 2016-05-18 ENCOUNTER — Telehealth: Payer: Self-pay | Admitting: *Deleted

## 2016-05-18 NOTE — Telephone Encounter (Signed)
Pt is not wearing a bra so its not that, pt will watch a couple more weeks and follow up.

## 2016-05-18 NOTE — Telephone Encounter (Signed)
For now may sure it is not her bra her undergarment disruption on her nipple. Please continue to do monthly breast exam. If persist after 3 months come by office for exam

## 2016-05-18 NOTE — Telephone Encounter (Signed)
Pt called takes estrogen patches 0.1 mg twice a week with the addition of Prometrium 200 mg for 12 days a month. C/o nipple soreness since starting the patch, asked me to relay this information to you. Please advise

## 2016-05-21 DIAGNOSIS — M7541 Impingement syndrome of right shoulder: Secondary | ICD-10-CM | POA: Diagnosis not present

## 2016-05-21 DIAGNOSIS — R29898 Other symptoms and signs involving the musculoskeletal system: Secondary | ICD-10-CM | POA: Diagnosis not present

## 2016-05-21 DIAGNOSIS — M25511 Pain in right shoulder: Secondary | ICD-10-CM | POA: Diagnosis not present

## 2016-05-21 DIAGNOSIS — M25611 Stiffness of right shoulder, not elsewhere classified: Secondary | ICD-10-CM | POA: Diagnosis not present

## 2016-05-30 ENCOUNTER — Telehealth: Payer: Self-pay | Admitting: *Deleted

## 2016-05-30 NOTE — Telephone Encounter (Signed)
Dr.Fernandez can you clarify the below:  "but didn't continue to take any progesterone for 10 days out of the month"   Should pt continue progesterone for 10 days monthly? Please advise

## 2016-05-30 NOTE — Telephone Encounter (Signed)
Begin to cut the Vivelle-Dot patch in half for the next 6 months but didn't continue to take any progesterone for 10 days out of the month. Limited all caffeine-containing products. Have her purchase vitamin E6 100 units and take 1 daily. After the 6 months she can attempt coming off the Vivelle-Dot and progesterone altogether

## 2016-05-30 NOTE — Telephone Encounter (Signed)
Pt called asking how to wean off current HRT vivelle-dot patch and progesterone 10 days of the month. Pt said the nipple tenderness has never stopped, medication makes her feel very nauseated. Please advise

## 2016-05-30 NOTE — Telephone Encounter (Signed)
Pt informed with the below note. 

## 2016-05-30 NOTE — Telephone Encounter (Signed)
Uterus until she comes completely off the estrogen patch

## 2016-06-05 DIAGNOSIS — M25611 Stiffness of right shoulder, not elsewhere classified: Secondary | ICD-10-CM | POA: Diagnosis not present

## 2016-06-05 DIAGNOSIS — M25511 Pain in right shoulder: Secondary | ICD-10-CM | POA: Diagnosis not present

## 2016-06-05 DIAGNOSIS — M7541 Impingement syndrome of right shoulder: Secondary | ICD-10-CM | POA: Diagnosis not present

## 2016-06-05 DIAGNOSIS — R29898 Other symptoms and signs involving the musculoskeletal system: Secondary | ICD-10-CM | POA: Diagnosis not present

## 2016-06-07 DIAGNOSIS — M7541 Impingement syndrome of right shoulder: Secondary | ICD-10-CM | POA: Diagnosis not present

## 2016-06-07 DIAGNOSIS — M25511 Pain in right shoulder: Secondary | ICD-10-CM | POA: Diagnosis not present

## 2016-06-07 DIAGNOSIS — R29898 Other symptoms and signs involving the musculoskeletal system: Secondary | ICD-10-CM | POA: Diagnosis not present

## 2016-06-07 DIAGNOSIS — M25611 Stiffness of right shoulder, not elsewhere classified: Secondary | ICD-10-CM | POA: Diagnosis not present

## 2016-06-11 DIAGNOSIS — M25611 Stiffness of right shoulder, not elsewhere classified: Secondary | ICD-10-CM | POA: Diagnosis not present

## 2016-06-11 DIAGNOSIS — M7541 Impingement syndrome of right shoulder: Secondary | ICD-10-CM | POA: Diagnosis not present

## 2016-06-11 DIAGNOSIS — R29898 Other symptoms and signs involving the musculoskeletal system: Secondary | ICD-10-CM | POA: Diagnosis not present

## 2016-06-11 DIAGNOSIS — M25511 Pain in right shoulder: Secondary | ICD-10-CM | POA: Diagnosis not present

## 2016-06-11 DIAGNOSIS — M79641 Pain in right hand: Secondary | ICD-10-CM | POA: Diagnosis not present

## 2016-06-21 DIAGNOSIS — M25511 Pain in right shoulder: Secondary | ICD-10-CM | POA: Diagnosis not present

## 2016-06-21 DIAGNOSIS — M25611 Stiffness of right shoulder, not elsewhere classified: Secondary | ICD-10-CM | POA: Diagnosis not present

## 2016-06-21 DIAGNOSIS — M7541 Impingement syndrome of right shoulder: Secondary | ICD-10-CM | POA: Diagnosis not present

## 2016-06-21 DIAGNOSIS — R29898 Other symptoms and signs involving the musculoskeletal system: Secondary | ICD-10-CM | POA: Diagnosis not present

## 2016-06-22 DIAGNOSIS — F4310 Post-traumatic stress disorder, unspecified: Secondary | ICD-10-CM | POA: Diagnosis not present

## 2016-06-27 DIAGNOSIS — M0609 Rheumatoid arthritis without rheumatoid factor, multiple sites: Secondary | ICD-10-CM | POA: Diagnosis not present

## 2016-06-27 DIAGNOSIS — M170 Bilateral primary osteoarthritis of knee: Secondary | ICD-10-CM | POA: Diagnosis not present

## 2016-06-27 DIAGNOSIS — Z79899 Other long term (current) drug therapy: Secondary | ICD-10-CM | POA: Diagnosis not present

## 2016-06-27 DIAGNOSIS — M79641 Pain in right hand: Secondary | ICD-10-CM | POA: Diagnosis not present

## 2016-06-27 DIAGNOSIS — M79642 Pain in left hand: Secondary | ICD-10-CM | POA: Diagnosis not present

## 2016-06-28 DIAGNOSIS — M25611 Stiffness of right shoulder, not elsewhere classified: Secondary | ICD-10-CM | POA: Diagnosis not present

## 2016-06-28 DIAGNOSIS — M25511 Pain in right shoulder: Secondary | ICD-10-CM | POA: Diagnosis not present

## 2016-06-28 DIAGNOSIS — M7541 Impingement syndrome of right shoulder: Secondary | ICD-10-CM | POA: Diagnosis not present

## 2016-06-28 DIAGNOSIS — R29898 Other symptoms and signs involving the musculoskeletal system: Secondary | ICD-10-CM | POA: Diagnosis not present

## 2016-06-28 DIAGNOSIS — F4310 Post-traumatic stress disorder, unspecified: Secondary | ICD-10-CM | POA: Diagnosis not present

## 2016-07-02 ENCOUNTER — Ambulatory Visit (INDEPENDENT_AMBULATORY_CARE_PROVIDER_SITE_OTHER): Payer: Medicare Other | Admitting: Internal Medicine

## 2016-07-02 ENCOUNTER — Encounter: Payer: Self-pay | Admitting: Internal Medicine

## 2016-07-02 DIAGNOSIS — N289 Disorder of kidney and ureter, unspecified: Secondary | ICD-10-CM

## 2016-07-02 DIAGNOSIS — E871 Hypo-osmolality and hyponatremia: Secondary | ICD-10-CM

## 2016-07-02 DIAGNOSIS — I480 Paroxysmal atrial fibrillation: Secondary | ICD-10-CM | POA: Diagnosis not present

## 2016-07-02 MED ORDER — RIVAROXABAN 20 MG PO TABS: 20.0000 mg | ORAL_TABLET | Freq: Every day | ORAL | 11 refills | Status: DC

## 2016-07-02 NOTE — Assessment & Plan Note (Signed)
Monitoring labs 

## 2016-07-02 NOTE — Progress Notes (Signed)
Pre visit review using our clinic review tool, if applicable. No additional management support is needed unless otherwise documented below in the visit note. 

## 2016-07-02 NOTE — Assessment & Plan Note (Signed)
H/o PAF w/RVR Xarelto

## 2016-07-02 NOTE — Progress Notes (Signed)
Subjective:  Patient ID: Amanda Wilkins, female    DOB: 09/17/1946  Age: 70 y.o. MRN: [id number]  CC: No chief complaint on file.   HPI Amanda Wilkins presents for knee OA - better after loosing 20 lbs. F/u R shoulder surgery - better - in PT. F/u A flutter. Getting labs at the [facility] clinic  Outpatient Medications Prior to Visit  Medication Sig Dispense Refill  . acetaminophen (TYLENOL) 650 MG CR tablet Take 650 mg by mouth every 8 (eight) hours as needed for pain.    [name] atenolol (TENORMIN) 50 MG tablet Take 0.5 tablets (25 mg total) by mouth daily. 90 tablet 3  . Cholecalciferol (VITAMIN D) 2000 UNITS tablet Take 2,000 Units by mouth daily.    [name] estradiol (VIVELLE-DOT) 0.1 MG/24HR patch Place 1 patch (0.1 mg total) onto the skin 2 (two) times a week. 8 patch 12  . Multiple Vitamin (MULTIVITAMIN) capsule Take 1 capsule by mouth daily.    . progesterone (PROMETRIUM) 200 MG capsule Take 1 capsule (200 mg total) by mouth daily. 36 capsule 4  . rivaroxaban (XARELTO) 20 MG TABS tablet Take 1 tablet (20 mg total) by mouth daily with supper. 30 tablet 6  . rosuvastatin (CRESTOR) 5 MG tablet Take 1 tablet (5 mg total) by mouth daily. 30 tablet 11  . sertraline (ZOLOFT) 50 MG tablet Take 2 tablets daily 180 tablet 3  . hydroxychloroquine (PLAQUENIL) 200 MG tablet Take 200 mg by mouth daily. Reported on 02/27/2016  3   No facility-administered medications prior to visit.     ROS Review of Systems  Constitutional: Negative for activity change, appetite change, chills, fatigue and unexpected weight change.  HENT: Negative for congestion, mouth sores and sinus pressure.   Eyes: Negative for visual disturbance.  Respiratory: Negative for cough and chest tightness.   Gastrointestinal: Negative for abdominal pain and nausea.  Genitourinary: Negative for difficulty urinating, frequency and vaginal pain.  Musculoskeletal: Negative for back pain and gait problem.  Skin: Negative for pallor  and rash.  Neurological: Negative for dizziness, tremors, weakness, numbness and headaches.  Psychiatric/Behavioral: Negative for confusion, sleep disturbance and suicidal ideas.    Objective:  BP 112/68   Pulse 68   Wt 216 lb (98 kg)   SpO2 98%   BMI 35.94 kg/m   BP Readings from Last 3 Encounters:  07/02/16 112/68  05/15/16 110/72  04/30/16 123/73    Wt Readings from Last 3 Encounters:  07/02/16 216 lb (98 kg)  05/15/16 215 lb 1.9 oz (97.6 kg)  04/30/16 217 lb (98.4 kg)    Physical Exam  Constitutional: She appears well-developed. No distress.  HENT:  Head: Normocephalic.  Right Ear: External ear normal.  Left Ear: External ear normal.  Nose: Nose normal.  Mouth/Throat: Oropharynx is clear and moist.  Eyes: Conjunctivae are normal. Pupils are equal, round, and reactive to light. Right eye exhibits no discharge. Left eye exhibits no discharge.  Neck: Normal range of motion. Neck supple. No JVD present. No tracheal deviation present. No thyromegaly present.  Cardiovascular: Normal rate, regular rhythm and normal heart sounds.   Pulmonary/Chest: No stridor. No respiratory distress. She has no wheezes.  Abdominal: Soft. Bowel sounds are normal. She exhibits no distension and no mass. There is no tenderness. There is no rebound and no guarding.  Musculoskeletal: She exhibits no edema or tenderness.  Lymphadenopathy:    She has no cervical adenopathy.  Neurological: She displays normal reflexes. No cranial nerve deficit. She  exhibits normal muscle tone. Coordination normal.  Skin: No rash noted. No erythema.  Psychiatric: She has a normal mood and affect. Her behavior is normal. Judgment and thought content normal.  R shoulder is tender w/ROM  Lab Results  Component Value Date   WBC 6.6 04/30/2016   HGB 12.6 04/30/2016   HCT 37.6 04/30/2016   PLT 204 04/30/2016   GLUCOSE 105 (H) 04/30/2016   CHOL 165 12/05/2015   TRIG 79.0 12/05/2015   HDL 92.80 12/05/2015    LDLCALC 56 12/05/2015   ALT 21 12/05/2015   AST 21 12/05/2015   NA 131 (L) 04/30/2016   K 5.1 04/30/2016   CL 98 (L) 04/30/2016   CREATININE 0.77 04/30/2016   BUN 21 (H) 04/30/2016   CO2 26 04/30/2016   TSH 2.70 03/15/2015   INR 1.6 03/05/2016   HGBA1C 5.8 (H) 01/30/2015    Mm Screening Breast Tomo Bilateral  Result Date: 05/01/2016 CLINICAL DATA:  Screening. EXAM: 2D DIGITAL SCREENING BILATERAL MAMMOGRAM WITH CAD AND ADJUNCT TOMO COMPARISON:  Previous exam(s). ACR Breast Density Category b: There are scattered areas of fibroglandular density. FINDINGS: There are no findings suspicious for malignancy. Images were processed with CAD. IMPRESSION: No mammographic evidence of malignancy. A result letter of this screening mammogram will be mailed directly to the patient. RECOMMENDATION: Screening mammogram in one year. (Code:SM-B-01Y) BI-RADS CATEGORY  1: Negative. Electronically Signed   By: Evangeline Dakin M.D.   On: 05/01/2016 15:54    Assessment & Plan:   There are no diagnoses linked to this encounter. I am having Ms. Matis maintain her Vitamin D, multivitamin, sertraline, acetaminophen, rosuvastatin, atenolol, rivaroxaban, estradiol, progesterone, and hydroxychloroquine.  Meds ordered this encounter  Medications  . hydroxychloroquine (PLAQUENIL) 200 MG tablet    Sig: Take 200 mg by mouth daily.     Follow-up: No Follow-up on file.  [name], MD

## 2016-07-02 NOTE — Assessment & Plan Note (Signed)
Labs pending.  

## 2016-07-05 DIAGNOSIS — M25511 Pain in right shoulder: Secondary | ICD-10-CM | POA: Diagnosis not present

## 2016-07-05 DIAGNOSIS — R29898 Other symptoms and signs involving the musculoskeletal system: Secondary | ICD-10-CM | POA: Diagnosis not present

## 2016-07-05 DIAGNOSIS — M25611 Stiffness of right shoulder, not elsewhere classified: Secondary | ICD-10-CM | POA: Diagnosis not present

## 2016-07-09 ENCOUNTER — Encounter: Payer: Self-pay | Admitting: Neurology

## 2016-07-09 ENCOUNTER — Ambulatory Visit (INDEPENDENT_AMBULATORY_CARE_PROVIDER_SITE_OTHER): Payer: Medicare Other | Admitting: Neurology

## 2016-07-09 VITALS — BP 122/68 | HR 73 | Temp 98.1°F | Ht 65.0 in | Wt 216.2 lb

## 2016-07-09 DIAGNOSIS — G43109 Migraine with aura, not intractable, without status migrainosus: Secondary | ICD-10-CM | POA: Diagnosis not present

## 2016-07-09 DIAGNOSIS — Z8669 Personal history of other diseases of the nervous system and sense organs: Secondary | ICD-10-CM | POA: Diagnosis not present

## 2016-07-09 DIAGNOSIS — H5310 Unspecified subjective visual disturbances: Secondary | ICD-10-CM

## 2016-07-09 NOTE — Progress Notes (Signed)
NEUROLOGY FOLLOW UP OFFICE NOTE  Amanda Wilkins [id number]  HISTORY OF PRESENT ILLNESS: I had the pleasure of seeing Amanda Wilkins in follow-up in the neurology clinic on 07/09/2016.  The patient was last seen 8 months ago for left vision changes. On her last visit, we reviewed workup done, etiology of left vision symptoms unclear, we discussed the possibility of acephalgic/ocular migraine, and increased Zoloft to 160m daily for migraine prophylaxis. She did not notice any change in symptoms with increase in dose. She takes Crestor for hyperlipidemia. She reports that since her last visit, symptoms are better, but she still sees lines in her left eye when she moves around and is more active. She denies any visual symptoms today. There is no associated headache, eye pain, dizziness, focal numbness/tingling/weakness. No falls.   HPI: This is a pleasant 70yo RH woman with a history of atrial fibrillation, hypertension, migraines in the 1970s, in her usual state of health until August/September 2016 when she started having vision changes in her left eye. She describes all of a sudden she started seeing 2 or 3 very dark vertical lines on the lateral corner of her left eye, pulsating when she looked to the left. This changed to a light gray line and circles moving all over the place, just seen on the lateral half of her left eye. Symptoms were pretty constant, she saw Dr. CMaylene Roesand was diagnosed with ocular migraine. She then saw retinal specialist Dr. RZadie Rhinewith overall unremarkable eye exam except for age-related nuclear cataracts bilaterally, diagnosis of ocular migraine. It was unusual that symptoms had persisted for 5 weeks, and neurological evaluation was recommended. She denies any eye pain, no associated nausea, vomiting, photo or phonophobia. At first she was wondering if she had a dull headache in the right frontal region. She does have a remote history of migraines, and since starting  Elavil in 1984 for depression, migraines resolved. She reports migraines in the early 1970s were "totally crazy, everything moving around," with associated photophobia followed by a headache. She denies any recent head injuries, infections. Her paternal grandmother may have had migraines. She denies any dizziness, focal numbness/tingling/weakness, bowel/bladder dysfunction. She woke up 3 months ago with pain in her hands and feet, saw Rheumatology and was started on prednisone, reporting she was still taking prednisone when eye symptoms started. She has a history of sepsis last April due to cellulitis/cell wall induration, found to be in atrial fibrillation. She reports alopecia 4-5 years ago, but worsened hair loss after the sepsis, adding that she started having different medical conditions after the bout of sepsis.   Diagnostic Data: Her ESR was 28, CRP 0.4, overall unremarkable. Lipid panel was elevated with total cholesterol of 211, LDL 103. I personally reviewed MRI brain with and without contrast which did not show any acute changes. Her carotid doppler showed 1-39% bilateral carotid stenosis with heterogeneous plaque bilaterally, no significant stenosis.   PAST MEDICAL HISTORY: Past Medical History:  Diagnosis Date  . Allergy   . Arthritis    ON BOTH KNEES  [place]   . Chronic interstitial nephritis   . Depression   . Hypertension   . Migraines    on Zoloft for migraines  . Obesity   . Palpitations     MEDICATIONS: Current Outpatient Prescriptions on File Prior to Visit  Medication Sig Dispense Refill  . acetaminophen (TYLENOL) 650 MG CR tablet Take 650 mg by mouth every 8 (eight) hours as needed for  pain.    . atenolol (TENORMIN) 50 MG tablet Take 0.5 tablets (25 mg total) by mouth daily. 90 tablet 3  . Cholecalciferol (VITAMIN D) 2000 UNITS tablet Take 2,000 Units by mouth daily.    [name] estradiol (VIVELLE-DOT) 0.1 MG/24HR patch Place 1 patch (0.1 mg total) onto  the skin 2 (two) times a week. 8 patch 12  . hydroxychloroquine (PLAQUENIL) 200 MG tablet Take 200 mg by mouth daily.    . Multiple Vitamin (MULTIVITAMIN) capsule Take 1 capsule by mouth daily.    . progesterone (PROMETRIUM) 200 MG capsule Take 1 capsule (200 mg total) by mouth daily. 36 capsule 4  . rivaroxaban (XARELTO) 20 MG TABS tablet Take 1 tablet (20 mg total) by mouth daily with supper. 30 tablet 11  . rosuvastatin (CRESTOR) 5 MG tablet Take 1 tablet (5 mg total) by mouth daily. 30 tablet 11  . sertraline (ZOLOFT) 50 MG tablet Take 2 tablets daily 180 tablet 3   No current facility-administered medications on file prior to visit.     ALLERGIES: Allergies  Allergen Reactions  . Epinephrine Base Other (See Comments)    Seriously increases heart rate  . Aspirin Other (See Comments)    Can take the coated 325 mg. Plain asa 325 mg speeds up the heart.  . Penicillins Other (See Comments)    Pt previously has been told not to take because of family history of reactions (water blisters). She took amoxicillin in 2012-2013 with no reaction  . Tape Other (See Comments)    Blisters from adhesive tape    FAMILY HISTORY: Family History  Problem Relation Age of Onset  . Pancreatic cancer Brother   . Lymphoma Mother   . Prostate cancer Father     metastatic prostate cancer    SOCIAL HISTORY: Social History   Social History  . Marital status: Married    Spouse name: N/A  . Number of children: 2  . Years of education: N/A   Occupational History  . retired Systems analyst  . Part-time (with temp agency)    Social History Main Topics  . Smoking status: Never Smoker  . Smokeless tobacco: Never Used     Comment: H/O social smoking at times when drinking--never a "habit"  . Alcohol use 0.0 oz/week     Comment: WINE- [employer] GLASSES AT [employer]  . Drug use: No  . Sexual activity: Yes     Comment: intercourse age 69, sexual partners more than 5   Other Topics Concern  . Not on file    Social History Narrative  . No narrative on file    REVIEW OF SYSTEMS: Constitutional: No fevers, chills, or sweats, no generalized fatigue, change in appetite Eyes: No visual changes, double vision, eye pain Ear, nose and throat: No hearing loss, ear pain, nasal congestion, sore throat Cardiovascular: No chest pain, palpitations Respiratory:  No shortness of breath at rest or with exertion, wheezes GastrointestinaI: No nausea, vomiting, diarrhea, abdominal pain, fecal incontinence Genitourinary:  No dysuria, urinary retention or frequency Musculoskeletal:  No neck pain, back pain Integumentary: No rash, pruritus, skin lesions Neurological: as above Psychiatric: No depression, insomnia, anxiety Endocrine: No palpitations, fatigue, diaphoresis, mood swings, change in appetite, change in weight, increased thirst Hematologic/Lymphatic:  No anemia, purpura, petechiae. Allergic/Immunologic: no itchy/runny eyes, nasal congestion, recent allergic reactions, rashes  PHYSICAL EXAM: Vitals:   07/09/16 1530  BP: 122/68  Pulse: 73  Temp: 98.1 F (36.7 C)   General: No acute distress Head:  Normocephalic/atraumatic Neck: supple, no paraspinal tenderness, full range of motion Heart:  Regular rate and rhythm Lungs:  Clear to auscultation bilaterally Back: No paraspinal tenderness Skin/Extremities: No rash, no edema Neurological Exam: alert and oriented to person, place, and time. No aphasia or dysarthria. Fund of knowledge is appropriate.  Recent and remote memory are intact.  Attention and concentration are normal.    Able to name objects and repeat phrases. Cranial nerves: Pupils equal, round, reactive to light.  Fundoscopic exam unremarkable, no papilledema. Extraocular movements intact with no nystagmus. No visual changes on exam today. Visual fields full. Facial sensation intact. No facial asymmetry. Tongue, uvula, palate midline.  Motor: Bulk and tone normal, muscle strength 5/5  throughout with no pronator drift.  Sensation to light touch intact.  No extinction to double simultaneous stimulation.  Deep tendon reflexes 2+ throughout, toes downgoing.  Finger to nose testing intact.  Gait narrow-based and steady, able to tandem walk adequately.  Romberg negative.  IMPRESSION: This is a pleasant 70 yo RH woman with a history of atrial fibrillation, hypertension, remote migraines, who presented with new onset left vision changes. Ophthalmological evaluation unremarkable, her 2 eye doctors have diagnosed her with ocular migraine. She denies any headaches or other focal symptoms. Her neurological exam is non-focal. MRI brain unremarkable. Carotid dopplers showed heterogeneous plaque but no significant stenosis. Consideration is for acephalgic migraine or ocular migraine, however the prolonged duration is atypical. It is curious that the visual obscurations occur when she is moving about, she reports symptoms are better except when she is more active. We had again discussed the option of trying Elavil again for presumed migraine prophylaxis, versus close clinical monitoring, particularly since symptoms are not significantly bothersome. She declines starting any medication at this time. She will follow-up on a prn basis and knows to call for any changes.   Thank you for allowing me to participate in her care.  Please do not hesitate to call for any questions or concerns.  The duration of this appointment visit was 15 minutes of face-to-face time with the patient.  Greater than 50% of this time was spent in counseling, explanation of diagnosis, planning of further management, and coordination of care.   Amanda Wilkins, M.D.   CC: Dr. Alain Marion

## 2016-07-09 NOTE — Patient Instructions (Signed)
Continue to monitor symptoms, call our office for any changes

## 2016-07-16 ENCOUNTER — Other Ambulatory Visit: Payer: Self-pay | Admitting: Internal Medicine

## 2016-07-20 ENCOUNTER — Encounter: Payer: Self-pay | Admitting: Nurse Practitioner

## 2016-07-20 ENCOUNTER — Ambulatory Visit (INDEPENDENT_AMBULATORY_CARE_PROVIDER_SITE_OTHER): Payer: Medicare Other | Admitting: Nurse Practitioner

## 2016-07-20 ENCOUNTER — Encounter: Payer: Self-pay | Admitting: *Deleted

## 2016-07-20 ENCOUNTER — Telehealth: Payer: Self-pay | Admitting: Internal Medicine

## 2016-07-20 VITALS — BP 128/22 | HR 86 | Temp 97.6°F | Ht 65.0 in | Wt 215.0 lb

## 2016-07-20 DIAGNOSIS — J069 Acute upper respiratory infection, unspecified: Secondary | ICD-10-CM

## 2016-07-20 DIAGNOSIS — B0089 Other herpesviral infection: Secondary | ICD-10-CM | POA: Diagnosis not present

## 2016-07-20 MED ORDER — IPRATROPIUM BROMIDE 0.03 % NA SOLN: 2.0000 | Freq: Two times a day (BID) | NASAL | 0 refills | Status: DC

## 2016-07-20 MED ORDER — CETIRIZINE HCL 10 MG PO TABS: 10.0000 mg | ORAL_TABLET | Freq: Every day | ORAL | 0 refills | Status: DC

## 2016-07-20 MED ORDER — DM-GUAIFENESIN ER 30-600 MG PO TB12: 1.0000 | ORAL_TABLET | Freq: Two times a day (BID) | ORAL | 0 refills | Status: DC | PRN

## 2016-07-20 MED ORDER — VALACYCLOVIR HCL 1 G PO TABS: 1000.0000 mg | ORAL_TABLET | Freq: Two times a day (BID) | ORAL | 0 refills | Status: DC

## 2016-07-20 NOTE — Patient Instructions (Signed)
Call office for oral abx prescription if symptoms do not continue to improve over the next 1week.  URI Instructions: Use over-the-counter  "cold" medicines  such as "Tylenol cold" , "Advil cold",  "Mucinex" or" Mucinex D"  for cough and congestion.  Avoid decongestants if you have high blood pressure. Use" Delsym" or" Robitussin" cough syrup varietis for cough.  You can use plain "Tylenol" or "Advi"l for fever, chills and achyness.   "Common cold" symptoms are usually triggered by a virus.  The antibiotics are usually not necessary. On average, a" viral cold" illness would take 4-7 days to resolve. Please, make an appointment if you are not better or if you're worse.

## 2016-07-20 NOTE — Telephone Encounter (Signed)
atenolol (TENORMIN) 50 MG tablet   Patient called and said the pharmacy is not making this medication no more. And they need Dr. Camila Li to call something else in for her. Please follow up, Thank you.

## 2016-07-20 NOTE — Progress Notes (Signed)
Pre visit review using our clinic review tool, if applicable. No additional management support is needed unless otherwise documented below in the visit note. 

## 2016-07-20 NOTE — Progress Notes (Signed)
Subjective:  Patient ID: Amanda Wilkins, female    DOB: Jul 07, 1946  Age: 70 y.o. MRN: [id number]  CC: Sinus Problem (Pt stated having runny nose, sinus pressure for about 1 week)   Sinus Problem  This is a new problem. The current episode started in the past 7 days. The problem has been gradually improving since onset. There has been no fever. The pain is mild. Associated symptoms include congestion, coughing, a hoarse voice, sinus pressure and sneezing. Pertinent negatives include no chills, ear pain, headaches, neck pain, shortness of breath or swollen glands. Past treatments include acetaminophen and saline sprays. The treatment provided moderate relief.  has develop oral and nasal ulcer with current symptoms  Outpatient Medications Prior to Visit  Medication Sig Dispense Refill  . acetaminophen (TYLENOL) 650 MG CR tablet Take 650 mg by mouth every 8 (eight) hours as needed for pain.    [name] atenolol (TENORMIN) 50 MG tablet TAKE 1 TABLET BY MOUTH EVERY DAY 90 tablet 3  . Cholecalciferol (VITAMIN D) 2000 UNITS tablet Take 2,000 Units by mouth daily.    [name] estradiol (VIVELLE-DOT) 0.1 MG/24HR patch Place 1 patch (0.1 mg total) onto the skin 2 (two) times a week. 8 patch 12  . hydroxychloroquine (PLAQUENIL) 200 MG tablet Take 200 mg by mouth daily.    . Multiple Vitamin (MULTIVITAMIN) capsule Take 1 capsule by mouth daily.    . progesterone (PROMETRIUM) 200 MG capsule Take 1 capsule (200 mg total) by mouth daily. 36 capsule 4  . rivaroxaban (XARELTO) 20 MG TABS tablet Take 1 tablet (20 mg total) by mouth daily with supper. 30 tablet 11  . rosuvastatin (CRESTOR) 5 MG tablet Take 1 tablet (5 mg total) by mouth daily. 30 tablet 11  . sertraline (ZOLOFT) 50 MG tablet Take 2 tablets daily 180 tablet 3   No facility-administered medications prior to visit.     ROS See HPI  Objective:  BP (!) 128/22 (BP Location: Right Arm, Patient Position: Sitting, Cuff Size: Normal)   Pulse 86   Temp  97.6 F (36.4 C)   Ht 5\' 5"  (1.651 m)   Wt 215 lb (97.5 kg)   SpO2 96%   BMI 35.78 kg/m   BP Readings from Last 3 Encounters:  07/20/16 (!) 128/22  07/09/16 122/68  07/02/16 112/68    Wt Readings from Last 3 Encounters:  07/20/16 215 lb (97.5 kg)  07/09/16 216 lb 3 oz (98.1 kg)  07/02/16 216 lb (98 kg)    Physical Exam  Constitutional: She is oriented to person, place, and time. No distress.  HENT:  Right Ear: Tympanic membrane, external ear and ear canal normal.  Left Ear: Tympanic membrane, external ear and ear canal normal.  Nose: Mucosal edema and rhinorrhea present. Right sinus exhibits no maxillary sinus tenderness and no frontal sinus tenderness. Left sinus exhibits no maxillary sinus tenderness and no frontal sinus tenderness.    Mouth/Throat: Uvula is midline. Posterior oropharyngeal erythema present. No oropharyngeal exudate or posterior oropharyngeal edema.  Eyes: No scleral icterus.  Neck: Neck supple.  Cardiovascular: Normal rate and normal heart sounds.   Pulmonary/Chest: Effort normal and breath sounds normal.  Musculoskeletal: She exhibits no edema.  Lymphadenopathy:    She has no cervical adenopathy.  Neurological: She is alert and oriented to person, place, and time.  Skin: Skin is warm and dry. Rash noted. Rash is vesicular. There is erythema.  Vitals reviewed.   Lab Results  Component Value Date   WBC  6.6 04/30/2016   HGB 12.6 04/30/2016   HCT 37.6 04/30/2016   PLT 204 04/30/2016   GLUCOSE 105 (H) 04/30/2016   CHOL 165 12/05/2015   TRIG 79.0 12/05/2015   HDL 92.80 12/05/2015   LDLCALC 56 12/05/2015   ALT 21 12/05/2015   AST 21 12/05/2015   NA 131 (L) 04/30/2016   K 5.1 04/30/2016   CL 98 (L) 04/30/2016   CREATININE 0.77 04/30/2016   BUN 21 (H) 04/30/2016   CO2 26 04/30/2016   TSH 2.70 03/15/2015   INR 1.6 03/05/2016   HGBA1C 5.8 (H) 01/30/2015    Mm Screening Breast Tomo Bilateral  Result Date: 05/01/2016 CLINICAL DATA:   Screening. EXAM: 2D DIGITAL SCREENING BILATERAL MAMMOGRAM WITH CAD AND ADJUNCT TOMO COMPARISON:  Previous exam(s). ACR Breast Density Category b: There are scattered areas of fibroglandular density. FINDINGS: There are no findings suspicious for malignancy. Images were processed with CAD. IMPRESSION: No mammographic evidence of malignancy. A result letter of this screening mammogram will be mailed directly to the patient. RECOMMENDATION: Screening mammogram in one year. (Code:SM-B-01Y) BI-RADS CATEGORY  1: Negative. Electronically Signed   By: Evangeline Dakin M.D.   On: 05/01/2016 15:54    Assessment & Plan:   Amanda Wilkins was seen today for sinus problem.  Diagnoses and all orders for this visit:  Acute URI -     ipratropium (ATROVENT) 0.03 % nasal spray; Place 2 sprays into both nostrils every 12 (twelve) hours. -     dextromethorphan-guaiFENesin (MUCINEX DM) 30-600 MG 12hr tablet; Take 1 tablet by mouth 2 (two) times daily as needed for cough.  Herpetic dermatitis -     valACYclovir (VALTREX) 1000 MG tablet; Take 1 tablet (1,000 mg total) by mouth 2 (two) times daily.   I am having Amanda Wilkins start on ipratropium, valACYclovir, and dextromethorphan-guaiFENesin. I am also having her maintain her Vitamin D, multivitamin, sertraline, acetaminophen, rosuvastatin, estradiol, progesterone, hydroxychloroquine, rivaroxaban, and atenolol.  Meds ordered this encounter  Medications  . ipratropium (ATROVENT) 0.03 % nasal spray    Sig: Place 2 sprays into both nostrils every 12 (twelve) hours.    Dispense:  30 mL    Refill:  0    Order Specific Question:   Supervising Provider    Answer:   [name] [[id number]]  . valACYclovir (VALTREX) 1000 MG tablet    Sig: Take 1 tablet (1,000 mg total) by mouth 2 (two) times daily.    Dispense:  14 tablet    Refill:  0    Order Specific Question:   Supervising Provider    Answer:   [name] [[id number]]  . dextromethorphan-guaiFENesin (MUCINEX  DM) 30-600 MG 12hr tablet    Sig: Take 1 tablet by mouth 2 (two) times daily as needed for cough.    Dispense:  14 tablet    Refill:  0    Order Specific Question:   Supervising Provider    Answer:   [name] [[id number]]    Follow-up: Return if symptoms worsen or fail to improve.  Wilfred Lacy, NP

## 2016-07-21 MED ORDER — METOPROLOL SUCCINATE ER 50 MG PO TB24: 50.0000 mg | ORAL_TABLET | Freq: Every day | ORAL | 11 refills | Status: DC

## 2016-07-21 NOTE — Telephone Encounter (Signed)
OK Toprol XL - see rx Thx

## 2016-07-22 ENCOUNTER — Encounter: Payer: Self-pay | Admitting: Neurology

## 2016-07-23 NOTE — Telephone Encounter (Signed)
Left detailed mess informing pt of below.  

## 2016-08-06 DIAGNOSIS — M5441 Lumbago with sciatica, right side: Secondary | ICD-10-CM | POA: Diagnosis not present

## 2016-08-16 ENCOUNTER — Ambulatory Visit (INDEPENDENT_AMBULATORY_CARE_PROVIDER_SITE_OTHER): Payer: Medicare Other

## 2016-08-16 DIAGNOSIS — Z23 Encounter for immunization: Secondary | ICD-10-CM | POA: Diagnosis not present

## 2016-09-04 ENCOUNTER — Ambulatory Visit (INDEPENDENT_AMBULATORY_CARE_PROVIDER_SITE_OTHER): Payer: Medicare Other | Admitting: Family

## 2016-09-04 ENCOUNTER — Encounter: Payer: Self-pay | Admitting: Family

## 2016-09-04 DIAGNOSIS — J0140 Acute pansinusitis, unspecified: Secondary | ICD-10-CM | POA: Diagnosis not present

## 2016-09-04 DIAGNOSIS — J329 Chronic sinusitis, unspecified: Secondary | ICD-10-CM | POA: Insufficient documentation

## 2016-09-04 MED ORDER — DOXYCYCLINE HYCLATE 100 MG PO TABS: 100.0000 mg | ORAL_TABLET | Freq: Two times a day (BID) | ORAL | 0 refills | Status: DC

## 2016-09-04 NOTE — Assessment & Plan Note (Signed)
Symptoms and exam consistent with bacterial sinusitis and acute upper respiratory infection. Start doxycycline. Continue over the counter medications as needed for symptom relief and supportive care. Follow up if symptoms worsen or do not improve.

## 2016-09-04 NOTE — Patient Instructions (Addendum)
Thank you for choosing [facility].  SUMMARY AND INSTRUCTIONS:  Medication:  Your prescription(s) have been submitted to your pharmacy or been printed and provided for you. Please take as directed and contact our office if you believe you are having problem(s) with the medication(s) or have any questions.   Follow up:  If your symptoms worsen or fail to improve, please contact our office for further instruction, or in case of emergency go directly to the emergency room at the closest medical facility.    General Recommendations:    Please drink plenty of fluids.  Get plenty of rest   Sleep in humidified air  Use saline nasal sprays  Netti pot   OTC Medications:  Decongestants - helps relieve congestion   Flonase (generic fluticasone) or Nasacort (generic triamcinolone) - please make sure to use the "cross-over" technique at a 45 degree angle towards the opposite eye as opposed to straight up the nasal passageway.   Sudafed (generic pseudoephedrine - Note this is the one that is available behind the pharmacy counter); Products with phenylephrine (-PE) may also be used but is often not as effective as pseudoephedrine.   If you have HIGH BLOOD PRESSURE - Coricidin HBP; AVOID any product that is -D as this contains pseudoephedrine which may increase your blood pressure.  Afrin (oxymetazoline) every 6-8 hours for up to 3 days.   Allergies - helps relieve runny nose, itchy eyes and sneezing   Claritin (generic loratidine), Allegra (fexofenidine), or Zyrtec (generic cyrterizine) for runny nose. These medications should not cause drowsiness.  Note - Benadryl (generic diphenhydramine) may be used however may cause drowsiness  Cough -   Delsym or Robitussin (generic dextromethorphan)  Expectorants - helps loosen mucus to ease removal   Mucinex (generic guaifenesin) as directed on the package.  Headaches / General Aches   Tylenol (generic acetaminophen) - DO  NOT EXCEED 3 grams (3,000 mg) in a 24 hour time period  Advil/Motrin (generic ibuprofen)   Sore Throat -   Salt water gargle   Chloraseptic (generic benzocaine) spray or lozenges / Sucrets (generic dyclonine)    Sinusitis Sinusitis is redness, soreness, and inflammation of the paranasal sinuses. Paranasal sinuses are air pockets within the bones of your face (beneath the eyes, the middle of the forehead, or above the eyes). In healthy paranasal sinuses, mucus is able to drain out, and air is able to circulate through them by way of your nose. However, when your paranasal sinuses are inflamed, mucus and air can become trapped. This can allow bacteria and other germs to grow and cause infection. Sinusitis can develop quickly and last only a short time (acute) or continue over a long period (chronic). Sinusitis that lasts for more than 12 weeks is considered chronic.  CAUSES  Causes of sinusitis include:  Allergies.  Structural abnormalities, such as displacement of the cartilage that separates your nostrils (deviated septum), which can decrease the air flow through your nose and sinuses and affect sinus drainage.  Functional abnormalities, such as when the small hairs (cilia) that line your sinuses and help remove mucus do not work properly or are not present. SIGNS AND SYMPTOMS  Symptoms of acute and chronic sinusitis are the same. The primary symptoms are pain and pressure around the affected sinuses. Other symptoms include:  Upper toothache.  Earache.  Headache.  Bad breath.  Decreased sense of smell and taste.  A cough, which worsens when you are lying flat.  Fatigue.  Fever.  Thick drainage   from your nose, which often is green and may contain pus (purulent).  Swelling and warmth over the affected sinuses. DIAGNOSIS  Your health care provider will perform a physical exam. During the exam, your health care provider may:  Look in your nose for signs of abnormal growths  in your nostrils (nasal polyps).  Tap over the affected sinus to check for signs of infection.  View the inside of your sinuses (endoscopy) using an imaging device that has a light attached (endoscope). If your health care provider suspects that you have chronic sinusitis, one or more of the following tests may be recommended:  Allergy tests.  Nasal culture. A sample of mucus is taken from your nose, sent to a lab, and screened for bacteria.  Nasal cytology. A sample of mucus is taken from your nose and examined by your health care provider to determine if your sinusitis is related to an allergy. TREATMENT  Most cases of acute sinusitis are related to a viral infection and will resolve on their own within 10 days. Sometimes medicines are prescribed to help relieve symptoms (pain medicine, decongestants, nasal steroid sprays, or saline sprays).  However, for sinusitis related to a bacterial infection, your health care provider will prescribe antibiotic medicines. These are medicines that will help kill the bacteria causing the infection.  Rarely, sinusitis is caused by a fungal infection. In theses cases, your health care provider will prescribe antifungal medicine. For some cases of chronic sinusitis, surgery is needed. Generally, these are cases in which sinusitis recurs more than 3 times per year, despite other treatments. HOME CARE INSTRUCTIONS   Drink plenty of water. Water helps thin the mucus so your sinuses can drain more easily.  Use a humidifier.  Inhale steam 3 to 4 times a day (for example, sit in the bathroom with the shower running).  Apply a warm, moist washcloth to your face 3 to 4 times a day, or as directed by your health care provider.  Use saline nasal sprays to help moisten and clean your sinuses.  Take medicines only as directed by your health care provider.  If you were prescribed either an antibiotic or antifungal medicine, finish it all even if you start to feel  better. SEEK IMMEDIATE MEDICAL CARE IF:  You have increasing pain or severe headaches.  You have nausea, vomiting, or drowsiness.  You have swelling around your face.  You have vision problems.  You have a stiff neck.  You have difficulty breathing. MAKE SURE YOU:   Understand these instructions.  Will watch your condition.  Will get help right away if you are not doing well or get worse. Document Released: 10/01/2005 Document Revised: 02/15/2014 Document Reviewed: 10/16/2011 ExitCare Patient Information 2015 [employer]. This information is not intended to replace advice given to you by your health care provider. Make sure you discuss any questions you have with your health care provider.   

## 2016-09-04 NOTE — Progress Notes (Signed)
Subjective:    Patient ID: Amanda Wilkins, female    DOB: November 19, 1945, 70 y.o.   MRN: [id number]  Chief Complaint  Patient presents with  . Sore Throat    sore throat and cough, sinus drainage, x2 weeks    HPI:  Amanda Wilkins is a 70 y.o. female who  has a past medical history of Allergy; Arthritis; Atrial fibrillation ([place]); Chronic interstitial nephritis; Depression; Hypertension; Migraines; Obesity; and Palpitations. and presents today for an office visit.  This is a new problem. Associated symptoms of sore throat, cough, and sinus congestion have been going on for about 2 weeks. Denies fevers. Modifying factors include salt water garggles. Course of the symptoms has gradually worsening. No recent antibiotics.   Allergies  Allergen Reactions  . Epinephrine Base Other (See Comments)    Seriously increases heart rate  . Aspirin Other (See Comments)    Can take the coated 325 mg. Plain asa 325 mg speeds up the heart.  . Penicillins Other (See Comments)    Pt previously has been told not to take because of family history of reactions (water blisters). She took amoxicillin in 2012-2013 with no reaction  . Tape Other (See Comments)    Blisters from adhesive tape    Outpatient Medications Prior to Visit  Medication Sig Dispense Refill  . acetaminophen (TYLENOL) 650 MG CR tablet Take 650 mg by mouth every 8 (eight) hours as needed for pain.    . Cholecalciferol (VITAMIN D) 2000 UNITS tablet Take 2,000 Units by mouth daily.    [name] estradiol (VIVELLE-DOT) 0.1 MG/24HR patch Place 1 patch (0.1 mg total) onto the skin 2 (two) times a week. 8 patch 12  . hydroxychloroquine (PLAQUENIL) 200 MG tablet Take 200 mg by mouth daily.    . metoprolol succinate (TOPROL XL) 50 MG 24 hr tablet Take 1 tablet (50 mg total) by mouth daily. Take with or immediately following a meal. 30 tablet 11  . Multiple Vitamin (MULTIVITAMIN) capsule Take 1 capsule by mouth daily.    . progesterone  (PROMETRIUM) 200 MG capsule Take 1 capsule (200 mg total) by mouth daily. 36 capsule 4  . rivaroxaban (XARELTO) 20 MG TABS tablet Take 1 tablet (20 mg total) by mouth daily with supper. 30 tablet 11  . rosuvastatin (CRESTOR) 5 MG tablet Take 1 tablet (5 mg total) by mouth daily. 30 tablet 11  . sertraline (ZOLOFT) 50 MG tablet Take 2 tablets daily 180 tablet 3  . cetirizine (ZYRTEC) 10 MG tablet Take 1 tablet (10 mg total) by mouth daily. 14 tablet 0  . dextromethorphan-guaiFENesin (MUCINEX DM) 30-600 MG 12hr tablet Take 1 tablet by mouth 2 (two) times daily as needed for cough. 14 tablet 0  . ipratropium (ATROVENT) 0.03 % nasal spray Place 2 sprays into both nostrils every 12 (twelve) hours. 30 mL 0  . valACYclovir (VALTREX) 1000 MG tablet Take 1 tablet (1,000 mg total) by mouth 2 (two) times daily. 14 tablet 0   No facility-administered medications prior to visit.      Review of Systems  Constitutional: Negative for chills and fever.  HENT: Positive for congestion, sinus pressure and sore throat.   Respiratory: Positive for cough. Negative for chest tightness and wheezing.   Cardiovascular: Negative for chest pain.      Objective:    BP 130/78 (BP Location: Left Arm, Patient Position: Sitting, Cuff Size: Large)   Pulse 85   Temp 98.3 F (36.8 C) (Oral)   Resp 16  Ht 5\' 5"  (1.651 m)   Wt 214 lb (97.1 kg)   SpO2 96%   BMI 35.61 kg/m  Nursing note and vital signs reviewed.  Physical Exam  Constitutional: She is oriented to person, place, and time. She appears well-developed and well-nourished.  HENT:  Right Ear: Hearing, tympanic membrane, external ear and ear canal normal.  Left Ear: Hearing, tympanic membrane, external ear and ear canal normal.  Nose: Nose normal. Right sinus exhibits no maxillary sinus tenderness and no frontal sinus tenderness. Left sinus exhibits no maxillary sinus tenderness and no frontal sinus tenderness.  Mouth/Throat: Uvula is midline, oropharynx is  clear and moist and mucous membranes are normal.  Neck: Neck supple.  Cardiovascular: Normal rate, regular rhythm, normal heart sounds and intact distal pulses.   Pulmonary/Chest: Effort normal and breath sounds normal.  Neurological: She is alert and oriented to person, place, and time.  Skin: Skin is warm and dry.       Assessment & Plan:   Problem List Items Addressed This Visit      Respiratory   Sinusitis    Symptoms and exam consistent with bacterial sinusitis and acute upper respiratory infection. Start doxycycline. Continue over the counter medications as needed for symptom relief and supportive care. Follow up if symptoms worsen or do not improve.       Relevant Medications   doxycycline (VIBRA-TABS) 100 MG tablet       I have discontinued Ms. Weisensel's ipratropium, valACYclovir, dextromethorphan-guaiFENesin, and cetirizine. I am also having her start on doxycycline. Additionally, I am having her maintain her Vitamin D, multivitamin, sertraline, acetaminophen, rosuvastatin, estradiol, progesterone, hydroxychloroquine, rivaroxaban, and metoprolol succinate.   Meds ordered this encounter  Medications  . doxycycline (VIBRA-TABS) 100 MG tablet    Sig: Take 1 tablet (100 mg total) by mouth 2 (two) times daily.    Dispense:  20 tablet    Refill:  0    Order Specific Question:   Supervising Provider    Answer:   Pricilla Holm A [id number]     Follow-up: Return if symptoms worsen or fail to improve.  Mauricio Po, FNP

## 2016-09-11 ENCOUNTER — Ambulatory Visit (INDEPENDENT_AMBULATORY_CARE_PROVIDER_SITE_OTHER): Payer: Medicare Other | Admitting: Internal Medicine

## 2016-09-11 ENCOUNTER — Encounter: Payer: Self-pay | Admitting: Internal Medicine

## 2016-09-11 VITALS — BP 108/62 | HR 81 | Temp 98.5°F | Resp 16 | Ht 65.0 in | Wt 214.0 lb

## 2016-09-11 DIAGNOSIS — J209 Acute bronchitis, unspecified: Secondary | ICD-10-CM

## 2016-09-11 DIAGNOSIS — I480 Paroxysmal atrial fibrillation: Secondary | ICD-10-CM

## 2016-09-11 HISTORY — DX: Acute bronchitis, unspecified: J20.9

## 2016-09-11 MED ORDER — FLUTICASONE FUROATE-VILANTEROL 100-25 MCG/INH IN AEPB: 1.0000 | INHALATION_SPRAY | Freq: Every day | RESPIRATORY_TRACT | 0 refills | Status: DC

## 2016-09-11 MED ORDER — CEFDINIR 300 MG PO CAPS: 300.0000 mg | ORAL_CAPSULE | Freq: Two times a day (BID) | ORAL | 0 refills | Status: DC

## 2016-09-11 MED ORDER — PROMETHAZINE-CODEINE 6.25-10 MG/5ML PO SYRP: 5.0000 mL | ORAL_SOLUTION | ORAL | 0 refills | Status: DC | PRN

## 2016-09-11 NOTE — Assessment & Plan Note (Addendum)
CXR Cefdinir if not better in 2 d (PCN allergy was acknowledged) Breo qd Prom-cod syr Rx

## 2016-09-11 NOTE — Progress Notes (Signed)
Subjective:  Patient ID: Amanda Wilkins, female    DOB: 1946-01-11  Age: 70 y.o. MRN: [id number]  CC: URI (Pt c/o head & chest congestion, sinus pressure, post nasal drainage w/sore throat, non-productive cough, fatigue x1.5 Wks.)   HPI [name] presents for dry severe cough x 7-10 d -  Worse. On Doxy - feeling worse  Outpatient Medications Prior to Visit  Medication Sig Dispense Refill  . acetaminophen (TYLENOL) 650 MG CR tablet Take 650 mg by mouth every 8 (eight) hours as needed for pain.    . Cholecalciferol (VITAMIN D) 2000 UNITS tablet Take 2,000 Units by mouth daily.    [name] estradiol (VIVELLE-DOT) 0.1 MG/24HR patch Place 1 patch (0.1 mg total) onto the skin 2 (two) times a week. 8 patch 12  . hydroxychloroquine (PLAQUENIL) 200 MG tablet Take 200 mg by mouth daily.    . metoprolol succinate (TOPROL XL) 50 MG 24 hr tablet Take 1 tablet (50 mg total) by mouth daily. Take with or immediately following a meal. 30 tablet 11  . Multiple Vitamin (MULTIVITAMIN) capsule Take 1 capsule by mouth daily.    . progesterone (PROMETRIUM) 200 MG capsule Take 1 capsule (200 mg total) by mouth daily. 36 capsule 4  . rivaroxaban (XARELTO) 20 MG TABS tablet Take 1 tablet (20 mg total) by mouth daily with supper. 30 tablet 11  . rosuvastatin (CRESTOR) 5 MG tablet Take 1 tablet (5 mg total) by mouth daily. 30 tablet 11  . sertraline (ZOLOFT) 50 MG tablet Take 2 tablets daily 180 tablet 3  . doxycycline (VIBRA-TABS) 100 MG tablet Take 1 tablet (100 mg total) by mouth 2 (two) times daily. 20 tablet 0   No facility-administered medications prior to visit.     ROS Review of Systems  Constitutional: Positive for fatigue. Negative for activity change, appetite change, chills, fever and unexpected weight change.  HENT: Negative for congestion, mouth sores and sinus pressure.   Eyes: Negative for visual disturbance.  Respiratory: Positive for cough. Negative for chest tightness, shortness of  breath and wheezing.   Gastrointestinal: Negative for abdominal pain and nausea.  Genitourinary: Negative for difficulty urinating, frequency and vaginal pain.  Musculoskeletal: Negative for back pain and gait problem.  Skin: Negative for pallor and rash.  Neurological: Negative for dizziness, tremors, weakness, numbness and headaches.  Psychiatric/Behavioral: Positive for sleep disturbance. Negative for confusion.    Objective:  BP 108/62 (BP Location: Left Arm, Patient Position: Sitting, Cuff Size: Large)   Pulse 81   Temp 98.5 F (36.9 C) (Oral)   Resp 16   Ht 5\' 5"  (1.651 m)   Wt 214 lb (97.1 kg)   SpO2 98%   BMI 35.61 kg/m   BP Readings from Last 3 Encounters:  09/11/16 108/62  09/04/16 130/78  07/20/16 (!) 128/22    Wt Readings from Last 3 Encounters:  09/11/16 214 lb (97.1 kg)  09/04/16 214 lb (97.1 kg)  07/20/16 215 lb (97.5 kg)    Physical Exam  Constitutional: She appears well-developed. No distress.  HENT:  Head: Normocephalic.  Right Ear: External ear normal.  Left Ear: External ear normal.  Nose: Nose normal.  Eyes: Conjunctivae are normal. Pupils are equal, round, and reactive to light. Right eye exhibits no discharge. Left eye exhibits no discharge.  Neck: Normal range of motion. Neck supple. No JVD present. No tracheal deviation present. No thyromegaly present.  Cardiovascular: Normal rate, regular rhythm and normal heart sounds.   Pulmonary/Chest: No stridor. No  respiratory distress. She has no wheezes.  Abdominal: Soft. Bowel sounds are normal. She exhibits no distension and no mass. There is no tenderness. There is no rebound and no guarding.  Musculoskeletal: She exhibits no edema or tenderness.  Lymphadenopathy:    She has no cervical adenopathy.  Neurological: She displays normal reflexes. No cranial nerve deficit. She exhibits normal muscle tone. Coordination normal.  Skin: No rash noted. No erythema.  Psychiatric: She has a normal mood and  affect. Her behavior is normal. Judgment and thought content normal.  coarse BS B Barking cough  I personally provided Breo inhaler use teaching. After the teaching patient was able to demonstrate it's use effectively. All questions were answered   Lab Results  Component Value Date   WBC 6.6 04/30/2016   HGB 12.6 04/30/2016   HCT 37.6 04/30/2016   PLT 204 04/30/2016   GLUCOSE 105 (H) 04/30/2016   CHOL 165 12/05/2015   TRIG 79.0 12/05/2015   HDL 92.80 12/05/2015   LDLCALC 56 12/05/2015   ALT 21 12/05/2015   AST 21 12/05/2015   NA 131 (L) 04/30/2016   K 5.1 04/30/2016   CL 98 (L) 04/30/2016   CREATININE 0.77 04/30/2016   BUN 21 (H) 04/30/2016   CO2 26 04/30/2016   TSH 2.70 03/15/2015   INR 1.6 03/05/2016   HGBA1C 5.8 (H) 01/30/2015    Mm Screening Breast Tomo Bilateral  Result Date: 05/01/2016 CLINICAL DATA:  Screening. EXAM: 2D DIGITAL SCREENING BILATERAL MAMMOGRAM WITH CAD AND ADJUNCT TOMO COMPARISON:  Previous exam(s). ACR Breast Density Category b: There are scattered areas of fibroglandular density. FINDINGS: There are no findings suspicious for malignancy. Images were processed with CAD. IMPRESSION: No mammographic evidence of malignancy. A result letter of this screening mammogram will be mailed directly to the patient. RECOMMENDATION: Screening mammogram in one year. (Code:SM-B-01Y) BI-RADS CATEGORY  1: Negative. Electronically Signed   By: Evangeline Dakin M.D.   On: 05/01/2016 15:54    Assessment & Plan:   There are no diagnoses linked to this encounter. I have discontinued Ms. Kitchen's doxycycline. I am also having her maintain her Vitamin D, multivitamin, sertraline, acetaminophen, rosuvastatin, estradiol, progesterone, hydroxychloroquine, rivaroxaban, and metoprolol succinate.  No orders of the defined types were placed in this encounter.    Follow-up: No Follow-up on file.  [name], MD

## 2016-09-11 NOTE — Patient Instructions (Signed)
Use over-the-counter  "cold" medicines  such as"Afrin" nasal spray for nasal congestion as directed instead. Use " Delsym" or" Robitussin" cough syrup varietis for cough.  You can use plain "Tylenol" or "Advil" for fever, chills and achyness. Use Halls or Ricola cough drops.  Please, make an appointment if you are not better or if you're worse.  

## 2016-09-11 NOTE — Progress Notes (Signed)
Pre visit review using our clinic review tool, if applicable. No additional management support is needed unless otherwise documented below in the visit note/SLS  

## 2016-09-11 NOTE — Assessment & Plan Note (Signed)
Bisoprolol, Xarelto, Cardizem

## 2016-09-17 ENCOUNTER — Ambulatory Visit (INDEPENDENT_AMBULATORY_CARE_PROVIDER_SITE_OTHER): Payer: Medicare Other | Admitting: Psychology

## 2016-09-17 DIAGNOSIS — F1010 Alcohol abuse, uncomplicated: Secondary | ICD-10-CM | POA: Diagnosis not present

## 2016-09-26 DIAGNOSIS — Z6834 Body mass index (BMI) 34.0-34.9, adult: Secondary | ICD-10-CM | POA: Diagnosis not present

## 2016-09-26 DIAGNOSIS — E669 Obesity, unspecified: Secondary | ICD-10-CM | POA: Diagnosis not present

## 2016-09-26 DIAGNOSIS — M2550 Pain in unspecified joint: Secondary | ICD-10-CM | POA: Diagnosis not present

## 2016-09-26 DIAGNOSIS — Z79899 Other long term (current) drug therapy: Secondary | ICD-10-CM | POA: Diagnosis not present

## 2016-09-26 DIAGNOSIS — M0609 Rheumatoid arthritis without rheumatoid factor, multiple sites: Secondary | ICD-10-CM | POA: Diagnosis not present

## 2016-10-02 ENCOUNTER — Telehealth: Payer: Self-pay | Admitting: Internal Medicine

## 2016-10-02 NOTE — Telephone Encounter (Signed)
Patient states that insurance is no longer going to cover crestor.  Is requesting a different med in place.

## 2016-10-03 NOTE — Telephone Encounter (Signed)
Do they cover generic crestor? Thx

## 2016-10-04 ENCOUNTER — Telehealth: Payer: Self-pay | Admitting: Emergency Medicine

## 2016-10-04 NOTE — Telephone Encounter (Signed)
Left detailed mess informing pt to call back with any further info/list of covered meds she has.

## 2016-10-04 NOTE — Telephone Encounter (Signed)
Pt called back and the prescription her insurance will cover is Rosuvastatin Calcium. Thanks

## 2016-10-05 MED ORDER — ROSUVASTATIN CALCIUM 5 MG PO TABS: 5.0000 mg | ORAL_TABLET | Freq: Every day | ORAL | 1 refills | Status: DC

## 2016-10-05 NOTE — Addendum Note (Signed)
Addended by: Cresenciano Lick on: 10/05/2016 05:25 PM   Modules accepted: Orders

## 2016-10-05 NOTE — Telephone Encounter (Signed)
Ok - change Crestor to generic Thx

## 2016-10-05 NOTE — Telephone Encounter (Signed)
Done. See meds. Pt informed  

## 2016-10-17 ENCOUNTER — Ambulatory Visit: Payer: Medicare Other | Admitting: Psychology

## 2016-10-23 ENCOUNTER — Ambulatory Visit (INDEPENDENT_AMBULATORY_CARE_PROVIDER_SITE_OTHER): Payer: Medicare Other | Admitting: Psychology

## 2016-10-23 DIAGNOSIS — F1010 Alcohol abuse, uncomplicated: Secondary | ICD-10-CM

## 2016-10-25 ENCOUNTER — Ambulatory Visit (INDEPENDENT_AMBULATORY_CARE_PROVIDER_SITE_OTHER): Payer: Medicare Other | Admitting: Internal Medicine

## 2016-10-25 ENCOUNTER — Encounter: Payer: Self-pay | Admitting: Internal Medicine

## 2016-10-25 ENCOUNTER — Ambulatory Visit (INDEPENDENT_AMBULATORY_CARE_PROVIDER_SITE_OTHER): Payer: Medicare Other

## 2016-10-25 VITALS — BP 130/72 | HR 77 | Ht 65.0 in | Wt 215.8 lb

## 2016-10-25 DIAGNOSIS — R002 Palpitations: Secondary | ICD-10-CM

## 2016-10-25 NOTE — Progress Notes (Signed)
Cardiology Office Note   Date:  10/25/2016   ID:  Amanda Wilkins, DOB 05-Apr-1946, MRN [id number]  PCP:  [name], MD  Cardiologist:   [name], MD    F/u of PAF   History of Present Illness: Amanda Wilkins is a 71 y.o. female with a history of CP and PAF  ON anticoagulatoin  Echo normal LVEF April 207m  Myovuew with inferolateral nd infer defect  Consistent with ischemia  CT declined by insurance.   Mild plaaquing on carotid arteries   Near syncope in past      Occasoinally pt  will feel hart skip then feels like it is going to stop At work outs at gym  HR is increased  Does a little cardio  Nu Step for 500 steps  Then leg lifts   HR 120  Does not feel dizzy  NO CP    Current Meds  Medication Sig  . acetaminophen (TYLENOL) 650 MG CR tablet Take 650 mg by mouth every 8 (eight) hours as needed for pain.  . Cholecalciferol (VITAMIN D) 2000 UNITS tablet Take 2,000 Units by mouth daily.  [name] estradiol (VIVELLE-DOT) 0.1 MG/24HR patch Place 1 patch (0.1 mg total) onto the skin 2 (two) times a week.  . hydroxychloroquine (PLAQUENIL) 200 MG tablet Take 200 mg by mouth daily.  . metoprolol succinate (TOPROL XL) 50 MG 24 hr tablet Take 1 tablet (50 mg total) by mouth daily. Take with or immediately following a meal.  . Multiple Vitamin (MULTIVITAMIN) capsule Take 1 capsule by mouth daily.  . progesterone (PROMETRIUM) 200 MG capsule Take 1 capsule (200 mg total) by mouth daily.  . rivaroxaban (XARELTO) 20 MG TABS tablet Take 1 tablet (20 mg total) by mouth daily with supper.  . rosuvastatin (CRESTOR) 5 MG tablet Take 1 tablet (5 mg total) by mouth daily. Please dispense generic.  [name] sertraline (ZOLOFT) 50 MG tablet Take 2 tablets daily     Allergies:   Epinephrine base; Aspirin; Penicillins; and Tape   Past Medical History:  Diagnosis Date  . Allergy   . Arthritis    ON BOTH KNEES  . Atrial fibrillation ([place])   . Chronic interstitial nephritis   . Depression   .  Hypertension   . Migraines    on Zoloft for migraines  . Obesity   . Palpitations     Past Surgical History:  Procedure Laterality Date  . CESAREAN SECTION     x 1  . DENTAL SURGERY     multiple implants  . ESOPHAGOGASTRODUODENOSCOPY N/A 02/08/2015   Procedure: ESOPHAGOGASTRODUODENOSCOPY (EGD);  Surgeon: [name], MD;  Location: [facility];  Service: Endoscopy;  Laterality: N/A;  . repair of torn meniscus on left 08/2014 Left 08/2015   Dr[facility] Ronnie Derby [facility]  . SHOULDER SURGERY Right April 04 2016     Social History:  The patient  reports that she has never smoked. She has never used smokeless tobacco. She reports that she drinks alcohol. She reports that she does not use drugs.   Family History:  The patient's family history includes Lymphoma in her mother; Pancreatic cancer in her brother; Prostate cancer in her father.    ROS:  Please see the history of present illness. All other systems are reviewed and  Negative to the above problem except as noted.    PHYSICAL EXAM: VS:  BP 130/72   Pulse 77   Ht 5\' 5"  (1.651 m)   Wt 215 lb 12.8  oz (97.9 kg)   SpO2 99%   BMI 35.91 kg/m   GEN: Well nourished, well developed, in no acute distress  HEENT: normal  Neck: no JVD, carotid bruits, or masses Cardiac: RRR; no murmurs, rubs, or gallops,no edema  Respiratory:  clear to auscultation bilaterally, normal work of breathing GI: soft, nontender, nondistended, + BS  No hepatomegaly  MS: no deformity Moving all extremities   Skin: warm and dry, no rash Neuro:  Strength and sensation are intact Psych: euthymic mood, full affect   EKG:  EKG is not  ordered today.   Lipid Panel    Component Value Date/Time   CHOL 165 12/05/2015 1550   TRIG 79.0 12/05/2015 1550   HDL 92.80 12/05/2015 1550   CHOLHDL 2 12/05/2015 1550   VLDL 15.8 12/05/2015 1550   LDLCALC 56 12/05/2015 1550      Wt Readings from Last 3 Encounters:  10/25/16 215 lb 12.8 oz (97.9 kg)  09/11/16 214 lb  (97.1 kg)  09/04/16 214 lb (97.1 kg)      ASSESSMENT AND PLAN:  1  Palpitations/tachycardia  Pt symptomatic  Most sound like isolated ectopy  WIll set up for event monitor  2  CP  Denies    Plan for f/u next fall   Current medicines are reviewed at length with the patient today.  The patient does not have concerns regarding medicines.  Signed, [name], MD  10/25/2016 11:54 AM    Amanda Wilkins, [place], [place]  [address] Phone: [phone]; Fax: [phone]

## 2016-10-25 NOTE — Patient Instructions (Signed)
Your physician recommends that you continue on your current medications as directed. Please refer to the Current Medication list given to you today. Your physician has recommended that you wear an event monitor. Event monitors are medical devices that record the heart's electrical activity. Doctors most often us these monitors to diagnose arrhythmias. Arrhythmias are problems with the speed or rhythm of the heartbeat. The monitor is a small, portable device. You can wear one while you do your normal daily activities. This is usually used to diagnose what is causing palpitations/syncope (passing out).  Your physician wants you to follow-up in: 9 months with Dr. Ross.   You will receive a reminder letter in the mail two months in advance. If you don't receive a letter, please call our office to schedule the follow-up appointment.  

## 2016-11-01 ENCOUNTER — Ambulatory Visit: Payer: Medicare Other | Admitting: Psychology

## 2016-11-05 ENCOUNTER — Other Ambulatory Visit: Payer: Self-pay | Admitting: Neurology

## 2016-11-05 DIAGNOSIS — G43109 Migraine with aura, not intractable, without status migrainosus: Secondary | ICD-10-CM

## 2016-11-05 NOTE — Telephone Encounter (Signed)
Please advise 

## 2016-11-05 NOTE — Telephone Encounter (Signed)
Pls ask patient if she would like her PCP to continue with refills, because if we will refill, we need to see her once a year for continued refills. Thanks

## 2016-11-05 NOTE — Telephone Encounter (Signed)
Contacted patient. She states she is already seeing PCP for this medication and she will call their office to get refill.

## 2016-11-05 NOTE — Telephone Encounter (Signed)
Patient last seen 07/09/16. Per OV note to follow-up as needed. Request refill on Zoloft. Is this okay?

## 2016-11-08 ENCOUNTER — Telehealth: Payer: Self-pay | Admitting: *Deleted

## 2016-11-08 NOTE — Telephone Encounter (Signed)
Prior Authorization for estradiol 0.1 mg patch form filled out and [facility]ed [facility] will wait for response.

## 2016-11-09 NOTE — Telephone Encounter (Signed)
Estradiol patch 0.1 mg has been approved until 11/08/2017

## 2016-11-14 ENCOUNTER — Other Ambulatory Visit: Payer: Self-pay | Admitting: Internal Medicine

## 2016-11-14 DIAGNOSIS — G43109 Migraine with aura, not intractable, without status migrainosus: Secondary | ICD-10-CM

## 2016-11-30 ENCOUNTER — Telehealth: Payer: Self-pay | Admitting: *Deleted

## 2016-11-30 MED ORDER — PROGESTERONE MICRONIZED 100 MG PO CAPS: ORAL_CAPSULE | ORAL | 5 refills | Status: DC

## 2016-11-30 NOTE — Telephone Encounter (Signed)
Pt takes vivelle dot patch 0.1 mg cuts patch in half, takes progesterone 200 mg daily. Pt said today is day 6 of her cycle, was heavy for 4 days, now lighter bright red bleeding. Pt said she has a cycle every month with progesterone, feel nauseated today, no breast tenderness. Pt asked if her progesterone should be lowered? Any recommendations? Please advise

## 2016-11-30 NOTE — Telephone Encounter (Signed)
She should not be taking the progesterone every day it certainly for 12 days of the month. She can take 100 mg for the first 12 days of the month instead of 1200 mg. Again only 12 days not every day. If continues to bleed after this she will need to come to the office for a biopsy and further evaluation

## 2016-11-30 NOTE — Telephone Encounter (Signed)
Pt informed with the below note, new Rx sent.

## 2016-12-04 ENCOUNTER — Ambulatory Visit (INDEPENDENT_AMBULATORY_CARE_PROVIDER_SITE_OTHER): Payer: Medicare Other | Admitting: Psychology

## 2016-12-04 DIAGNOSIS — F1020 Alcohol dependence, uncomplicated: Secondary | ICD-10-CM | POA: Diagnosis not present

## 2016-12-04 DIAGNOSIS — F4323 Adjustment disorder with mixed anxiety and depressed mood: Secondary | ICD-10-CM

## 2016-12-10 ENCOUNTER — Telehealth: Payer: Self-pay | Admitting: Internal Medicine

## 2016-12-10 MED ORDER — FLECAINIDE ACETATE 50 MG PO TABS: 50.0000 mg | ORAL_TABLET | Freq: Two times a day (BID) | ORAL | 3 refills | Status: DC

## 2016-12-10 NOTE — Telephone Encounter (Signed)
Notes Recorded by Fay Records, MD on 12/02/2016 at 10:53 PM EST I have reviewed. Episodes of atrial fib/SVT not always sensed  Some were sensed Reviewed with EP Pt could try 50 mg Flecanide 2x per day COntinue other meds  COme back 1 wk to 10 days after starting for EKG Continue other meds    Reviewed with patient.Advised of sensed/not sensed afib/svt. She was surprised because she really didn't think her heart was "doing much the whole time". Will start Flecainide 50 mg BID and come for EKG on 12/19/16. Scheduled nurse visit for EKG. Sent medication to requested pharmacy.

## 2016-12-10 NOTE — Telephone Encounter (Signed)
Mrs. Aceves is returning a call about the results of the heart monitor . Thanks

## 2016-12-11 ENCOUNTER — Telehealth: Payer: Self-pay | Admitting: Internal Medicine

## 2016-12-11 ENCOUNTER — Other Ambulatory Visit: Payer: Self-pay | Admitting: *Deleted

## 2016-12-11 NOTE — Telephone Encounter (Signed)
New message    Pt is calling about appt scheduled for 12/19/16. She will be out of town this day and needs to reschedule. She was hoping for the next week.

## 2016-12-11 NOTE — Telephone Encounter (Signed)
Pt states she has not started flecainide yet. Pt states she got side effect profile from pharmacy and was concerned about side effects of flecainide. Pt is going out of town for a week next week and was concerned about starting flecainide this week then going out of town next week.    Pt states she several months ago she changed from atenolol (does not make anymore, lowered her BP) to metoprolol succinate 50mg  daily. Pt states she was prescribed metoprolol succinate 50mg  daily but always only took 25mg  daily. Pt is asking if she can increase metoprolol succinate from 25mg  daily to 50mg  daily instead of starting flecainide.  I reviewed with Dr Camnitz-Depends on goal-metoprolol will help with rate control not rhythm control. Per Dr Moshe Salisbury can increase metoprolol succinate to 50mg  daily, does not need EKG if she increases metoprolol and does not start flecainide.  Pt states she would like to go ahead and increase metoprolol to 50mg  daily, not start flecainide at this time.  I have cancelled appt scheduled for EKG 12/19/16.  I did confirm that pt is taking Xarelto 20mg  daily.  Pt is asking: Should she repeat monitor on higher dose of metoprolol? Does Dr [name] still want her to start flecainide?-if so she would like to wait until week after next since she is going out of town next week.  Pt advised I will forward to Dr [name] for review:

## 2016-12-12 NOTE — Telephone Encounter (Signed)
That is OK She does not have to start flecanide.    Did have symtpoms with some of afib that is why I recomm starting But, if not bothered much can increase metoprolol, follow BP  Continue Xarelto

## 2016-12-13 NOTE — Telephone Encounter (Signed)
Discussed with patient over the phone.   Because she was taking 1/2 of atenolol she just went ahead and took 1/2 of Toprol when her PCP changed over the medicines.    Has been taking Toprol XL 50 mg daily now and keeping track of BP and HR.  She will send to Dr.Ross via MyChart portal in a couple weeks.  Pt is aware that if her HR gets fast or she feels pounding in her chest or has SOB she is to call office to let Dr. [name] know.  Pt very appreciative for assistance.

## 2016-12-18 ENCOUNTER — Ambulatory Visit: Payer: Medicare Other | Admitting: Psychology

## 2016-12-30 NOTE — Progress Notes (Signed)
Subjective:   Amanda Wilkins is a 71 y.o. female who presents for Medicare Annual (Subsequent) preventive examination.  Review of Systems:  No ROS.  Medicare Wellness Visit.    Sleep patterns: no sleep issues, feels rested on waking, gets up 2-3 times nightly to void and sleeps 8 hours nightly.   Home Safety/Smoke Alarms: .safe   Living environment; residence and Firearm Safety: 2-story house, no firearms. Lives with husband Seat Belt Safety/Bike Helmet: Wears seat belt.   Counseling:   Eye Exam- goes yearly Dental- every  6 months  Female:   Pap- N/A      Mammo- Last 05/01/16,   BI-RADS CATEGORY  1: Negative     Dexa scan- Patient reports she has a scan every 2 years      CCS- Last 01/29/11, normal, recall 10 years      Objective:     Vitals: BP 108/60   Pulse 75   Temp 98.3 F (36.8 C) (Oral)   Resp 16   Ht 5\' 5"  (1.651 m)   Wt 220 lb (99.8 kg)   SpO2 95%   BMI 36.61 kg/m   Body mass index is 36.61 kg/m.   Tobacco History  Smoking Status  . Never Smoker  Smokeless Tobacco  . Never Used    Comment: H/O social smoking at times when drinking--never a "habit"     Counseling given: Not Answered   Past Medical History:  Diagnosis Date  . Allergy   . Arthritis    ON BOTH KNEES  . Atrial fibrillation ([place])   . Chronic interstitial nephritis   . Depression   . Hypertension   . Migraines    on Zoloft for migraines  . Obesity   . Palpitations    Past Surgical History:  Procedure Laterality Date  . CESAREAN SECTION     x 1  . DENTAL SURGERY     multiple implants  . ESOPHAGOGASTRODUODENOSCOPY N/A 02/08/2015   Procedure: ESOPHAGOGASTRODUODENOSCOPY (EGD);  Surgeon: [name], MD;  Location: [facility];  Service: Endoscopy;  Laterality: N/A;  . repair of torn meniscus on left 08/2014 Left 08/2015   [facility] Ronnie Derby [facility]  . SHOULDER SURGERY Right April 04 2016   Family History  Problem Relation Age of Onset  . Pancreatic cancer Brother   .  Lymphoma Mother   . Prostate cancer Father     metastatic prostate cancer   History  Sexual Activity  . Sexual activity: Yes    Comment: intercourse age 50, sexual partners more than 5    Outpatient Encounter Prescriptions as of 12/31/2016  Medication Sig  . acetaminophen (TYLENOL) 650 MG CR tablet Take 650 mg by mouth every 8 (eight) hours as needed for pain.  [name] acyclovir (ZOVIRAX) 400 MG tablet Take 1 tablet (400 mg total) by mouth 3 (three) times daily.  [name] atenolol (TENORMIN) 50 MG tablet Take 1 tablet (50 mg total) by mouth daily.  . Cholecalciferol (VITAMIN D) 2000 UNITS tablet Take 2,000 Units by mouth daily.  [name] estradiol (VIVELLE-DOT) 0.1 MG/24HR patch Place 1 patch (0.1 mg total) onto the skin 2 (two) times a week.  . hydroxychloroquine (PLAQUENIL) 200 MG tablet Take 200 mg by mouth daily.  . metoprolol succinate (TOPROL XL) 50 MG 24 hr tablet Take 1 tablet (50 mg total) by mouth daily. Take with or immediately following a meal.  . Multiple Vitamin (MULTIVITAMIN) capsule Take 1 capsule by mouth daily.  . mupirocin ointment (BACTROBAN) 2 %  Use in both nostrils  . progesterone (PROMETRIUM) 100 MG capsule Take one tablet by mouth day 1-12 every month  . rivaroxaban (XARELTO) 20 MG TABS tablet Take 1 tablet (20 mg total) by mouth daily with supper.  . rosuvastatin (CRESTOR) 5 MG tablet Take 1 tablet (5 mg total) by mouth daily. Please dispense generic.  [name] sertraline (ZOLOFT) 50 MG tablet TAKE 2 TABLETS BY MOUTH DAILY   No facility-administered encounter medications on file as of 12/31/2016.     Activities of Daily Living In your present state of health, do you have any difficulty performing the following activities: 12/31/2016  Hearing? N  Vision? N  Difficulty concentrating or making decisions? N  Walking or climbing stairs? N  Dressing or bathing? N  Doing errands, shopping? N  Preparing Food and eating ? N  Using the Toilet? N  In the past six months, have you accidently  leaked urine? N  Do you have problems with loss of bowel control? N  Managing your Medications? N  Managing your Finances? N  Housekeeping or managing your Housekeeping? N  Some recent data might be hidden    Patient Care Team: [name], MD as PCP - General Fay Records, MD as Consulting Physician (Cardiology) [name], MD as Consulting Physician (Gastroenterology) Terrance Mass, MD as Consulting Physician (Gynecology) Leanora Cover, MD as Consulting Physician (Orthopedic Surgery) Cameron Sprang, MD as Consulting Physician (Neurology)    Assessment:    Physical assessment deferred to PCP.  Exercise Activities and Dietary recommendations Current Exercise Habits: Structured exercise class, Type of exercise: walking  Diet (meal preparation, eat out, water intake, caffeinated beverages, dairy products, fruits and vegetables): in general, a "healthy" diet  , well balanced, diabetic, low fat/ cholesterol, low salt Drinks 6 bottles of water per day   Patient is currently participating in a diet program through [facility]. She reports a loss of approximately 20 pounds and she feels motivated.  Goals    . Stay healthy to keep my  heart strong          Continue to lose weight, eat healthy and exercise.      Fall Risk Fall Risk  12/31/2016 07/09/2016 11/08/2015 08/30/2015 07/12/2014  Falls in the past year? Yes No No No No  Number falls in past yr: 1 - - - -  Risk for fall due to : Impaired balance/gait - - - Other (Comment)   Depression Screen PHQ 2/9 Scores 12/31/2016 08/30/2015 07/12/2014 07/05/2014  PHQ - 2 Score 1 0 0 0     Cognitive Function       Ad8 score reviewed for issues:  Issues making decisions: no  Less interest in hobbies / activities: no  Repeats questions, stories (family complaining): no  Trouble using ordinary gadgets (microwave, computer, phone): no  Forgets the month or year: no  Mismanaging finances: no  Remembering appts:  no  Daily problems with thinking and/or memory: no Ad8 score is= 0     Immunization History  Administered Date(s) Administered  . Influenza Split 09/24/2011, 09/24/2012  . Influenza Whole 08/09/2010  . Influenza, High Dose Seasonal PF 08/16/2016  . Influenza,inj,Quad PF,36+ Mos 07/14/2013, 07/27/2014, 06/14/2015  . Pneumococcal Conjugate-13 05/25/2014  . Pneumococcal Polysaccharide-23 06/21/2015  . Tdap 01/06/2013  . Zoster 01/24/2011   Screening Tests Health Maintenance  Topic Date Due  . MAMMOGRAM  05/01/2018  . COLONOSCOPY  01/28/2021  . TETANUS/TDAP  01/07/2023  . INFLUENZA VACCINE  Completed  . DEXA SCAN  Completed  . Hepatitis C Screening  Completed  . PNA vac Low Risk Adult  Completed      Plan:    Continue to eat heart healthy diet (full of fruits, vegetables, whole grains, lean protein, water--limit salt, fat, and sugar intake) and increase physical activity as tolerated.  Continue doing brain stimulating activities (puzzles, reading, adult coloring books, staying active) to keep memory sharp.    During the course of the visit the patient was educated and counseled about the following appropriate screening and preventive services:   Vaccines to include Pneumoccal, Influenza, Hepatitis B, Td, Zostavax, HCV  Cardiovascular Disease  Colorectal cancer screening  Bone density screening  Diabetes screening  Glaucoma screening  Mammography/PAP  Nutrition counseling   Patient Instructions (the written plan) was given to the patient.   Michiel Cowboy, RN  12/31/2016  Medical screening examination/treatment/procedure(s) were performed by non-physician practitioner and as supervising physician I was immediately available for consultation/collaboration. I agree with above. [name], MD

## 2016-12-30 NOTE — Progress Notes (Signed)
Pre visit review using our clinic review tool, if applicable. No additional management support is needed unless otherwise documented below in the visit note. 

## 2016-12-31 ENCOUNTER — Ambulatory Visit (INDEPENDENT_AMBULATORY_CARE_PROVIDER_SITE_OTHER): Payer: Medicare Other | Admitting: Internal Medicine

## 2016-12-31 ENCOUNTER — Encounter: Payer: Self-pay | Admitting: Internal Medicine

## 2016-12-31 VITALS — BP 108/60 | HR 75 | Temp 98.3°F | Resp 16 | Ht 65.0 in | Wt 220.0 lb

## 2016-12-31 DIAGNOSIS — I480 Paroxysmal atrial fibrillation: Secondary | ICD-10-CM | POA: Diagnosis not present

## 2016-12-31 DIAGNOSIS — R29898 Other symptoms and signs involving the musculoskeletal system: Secondary | ICD-10-CM | POA: Diagnosis not present

## 2016-12-31 DIAGNOSIS — F4323 Adjustment disorder with mixed anxiety and depressed mood: Secondary | ICD-10-CM | POA: Diagnosis not present

## 2016-12-31 DIAGNOSIS — J3489 Other specified disorders of nose and nasal sinuses: Secondary | ICD-10-CM | POA: Insufficient documentation

## 2016-12-31 DIAGNOSIS — M25611 Stiffness of right shoulder, not elsewhere classified: Secondary | ICD-10-CM | POA: Diagnosis not present

## 2016-12-31 DIAGNOSIS — M25511 Pain in right shoulder: Secondary | ICD-10-CM | POA: Diagnosis not present

## 2016-12-31 DIAGNOSIS — Z0000 Encounter for general adult medical examination without abnormal findings: Secondary | ICD-10-CM

## 2016-12-31 DIAGNOSIS — M7541 Impingement syndrome of right shoulder: Secondary | ICD-10-CM | POA: Diagnosis not present

## 2016-12-31 DIAGNOSIS — S43431D Superior glenoid labrum lesion of right shoulder, subsequent encounter: Secondary | ICD-10-CM | POA: Diagnosis not present

## 2016-12-31 MED ORDER — ACYCLOVIR 400 MG PO TABS: 400.0000 mg | ORAL_TABLET | Freq: Three times a day (TID) | ORAL | 3 refills | Status: DC

## 2016-12-31 MED ORDER — MUPIROCIN 2 % EX OINT: TOPICAL_OINTMENT | CUTANEOUS | 0 refills | Status: DC

## 2016-12-31 MED ORDER — ATENOLOL 50 MG PO TABS: 50.0000 mg | ORAL_TABLET | Freq: Every day | ORAL | 11 refills | Status: DC

## 2016-12-31 NOTE — Progress Notes (Signed)
Pre-visit discussion using our clinic review tool. No additional management support is needed unless otherwise documented below in the visit note.  

## 2016-12-31 NOTE — Progress Notes (Signed)
Subjective:  Patient ID: Amanda Wilkins, female    DOB: 12-Feb-1946  Age: 71 y.o. MRN: [id number]  CC: Follow-up (afib, RA, OA)   HPI Amanda Wilkins presents for HTN, A fib, RA f/u  C/o clear nasal d/c c/o sore in the R nostril  Outpatient Medications Prior to Visit  Medication Sig Dispense Refill  . acetaminophen (TYLENOL) 650 MG CR tablet Take 650 mg by mouth every 8 (eight) hours as needed for pain.    . Cholecalciferol (VITAMIN D) 2000 UNITS tablet Take 2,000 Units by mouth daily.    [name] estradiol (VIVELLE-DOT) 0.1 MG/24HR patch Place 1 patch (0.1 mg total) onto the skin 2 (two) times a week. 8 patch 12  . hydroxychloroquine (PLAQUENIL) 200 MG tablet Take 200 mg by mouth daily.    . metoprolol succinate (TOPROL XL) 50 MG 24 hr tablet Take 1 tablet (50 mg total) by mouth daily. Take with or immediately following a meal. 30 tablet 11  . Multiple Vitamin (MULTIVITAMIN) capsule Take 1 capsule by mouth daily.    . progesterone (PROMETRIUM) 100 MG capsule Take one tablet by mouth day 1-12 every month 12 capsule 5  . rivaroxaban (XARELTO) 20 MG TABS tablet Take 1 tablet (20 mg total) by mouth daily with supper. 30 tablet 11  . rosuvastatin (CRESTOR) 5 MG tablet Take 1 tablet (5 mg total) by mouth daily. Please dispense generic. 90 tablet 1  . sertraline (ZOLOFT) 50 MG tablet TAKE 2 TABLETS BY MOUTH DAILY 180 tablet 1   No facility-administered medications prior to visit.     ROS Review of Systems  Constitutional: Negative for activity change, appetite change, chills, fatigue and unexpected weight change.  HENT: Positive for congestion, postnasal drip and rhinorrhea. Negative for mouth sores and sinus pressure.   Eyes: Negative for visual disturbance.  Respiratory: Negative for cough and chest tightness.   Cardiovascular: Positive for palpitations.  Gastrointestinal: Negative for abdominal pain and nausea.  Genitourinary: Negative for difficulty urinating, frequency and  vaginal pain.  Musculoskeletal: Negative for back pain and gait problem.  Skin: Negative for pallor and rash.  Neurological: Negative for dizziness, tremors, weakness, numbness and headaches.  Psychiatric/Behavioral: Negative for confusion and sleep disturbance.    Objective:  BP 108/60   Pulse 75   Temp 98.3 F (36.8 C) (Oral)   Resp 16   Ht 5\' 5"  (1.651 m)   Wt 220 lb (99.8 kg)   SpO2 95%   BMI 36.61 kg/m   BP Readings from Last 3 Encounters:  12/31/16 108/60  10/25/16 130/72  09/11/16 108/62    Wt Readings from Last 3 Encounters:  12/31/16 220 lb (99.8 kg)  10/25/16 215 lb 12.8 oz (97.9 kg)  09/11/16 214 lb (97.1 kg)    Physical Exam  Constitutional: Amanda Wilkins appears well-developed. No distress.  HENT:  Head: Normocephalic.  Right Ear: External ear normal.  Left Ear: External ear normal.  Nose: Nose normal.  Mouth/Throat: Oropharynx is clear and moist.  Eyes: Conjunctivae are normal. Pupils are equal, round, and reactive to light. Right eye exhibits no discharge. Left eye exhibits no discharge.  Neck: Normal range of motion. Neck supple. No JVD present. No tracheal deviation present. No thyromegaly present.  Cardiovascular: Normal rate, regular rhythm and normal heart sounds.   Pulmonary/Chest: No stridor. No respiratory distress. Amanda Wilkins has no wheezes.  Abdominal: Soft. Bowel sounds are normal. Amanda Wilkins exhibits no distension and no mass. There is no tenderness. There is no rebound and no guarding.  Musculoskeletal: Amanda Wilkins exhibits no edema or tenderness.  Lymphadenopathy:    Amanda Wilkins has no cervical adenopathy.  Neurological: Amanda Wilkins displays normal reflexes. No cranial nerve deficit. Amanda Wilkins exhibits normal muscle tone. Coordination normal.  Skin: No rash noted. No erythema.  Psychiatric: Amanda Wilkins has a normal mood and affect. Amanda Wilkins behavior is normal. Judgment and thought content normal.  B nares w/sores  Lab Results  Component Value Date   WBC 6.6 04/30/2016   HGB 12.6 04/30/2016   HCT  37.6 04/30/2016   PLT 204 04/30/2016   GLUCOSE 105 (H) 04/30/2016   CHOL 165 12/05/2015   TRIG 79.0 12/05/2015   HDL 92.80 12/05/2015   LDLCALC 56 12/05/2015   ALT 21 12/05/2015   AST 21 12/05/2015   NA 131 (L) 04/30/2016   K 5.1 04/30/2016   CL 98 (L) 04/30/2016   CREATININE 0.77 04/30/2016   BUN 21 (H) 04/30/2016   CO2 26 04/30/2016   TSH 2.70 03/15/2015   INR 1.6 03/05/2016   HGBA1C 5.8 (H) 01/30/2015    Mm Screening Breast Tomo Bilateral  Result Date: 05/01/2016 CLINICAL DATA:  Screening. EXAM: 2D DIGITAL SCREENING BILATERAL MAMMOGRAM WITH CAD AND ADJUNCT TOMO COMPARISON:  Previous exam(s). ACR Breast Density Category b: There are scattered areas of fibroglandular density. FINDINGS: There are no findings suspicious for malignancy. Images were processed with CAD. IMPRESSION: No mammographic evidence of malignancy. A result letter of this screening mammogram will be mailed directly to the patient. RECOMMENDATION: Screening mammogram in one year. (Code:SM-B-01Y) BI-RADS CATEGORY  1: Negative. Electronically Signed   By: Evangeline Dakin M.D.   On: 05/01/2016 15:54    Assessment & Plan:   Amanda Wilkins was seen today for follow-up.  Diagnoses and all orders for this visit:  Sore in nose   I am having Amanda Wilkins maintain Amanda Wilkins Vitamin D, multivitamin, acetaminophen, estradiol, hydroxychloroquine, rivaroxaban, metoprolol succinate, rosuvastatin, sertraline, and progesterone.  No orders of the defined types were placed in this encounter.    Follow-up: No Follow-up on file.  [name], MD

## 2016-12-31 NOTE — Patient Instructions (Addendum)
Continue to eat heart healthy diet (full of fruits, vegetables, whole grains, lean protein, water--limit salt, fat, and sugar intake) and increase physical activity as tolerated.  Continue doing brain stimulating activities (puzzles, reading, adult coloring books, staying active) to keep memory sharp.   Amanda Wilkins , Thank you for taking time to come for your Medicare Wellness Visit. I appreciate your ongoing commitment to your health goals. Please review the following plan we discussed and let me know if I can assist you in the future.   These are the goals we discussed: Goals    . Stay healthy to keep my  heart strong          Continue to lose weight, eat healthy and exercise.       This is a list of the screening recommended for you and due dates:  Health Maintenance  Topic Date Due  . Mammogram  05/01/2018  . Colon Cancer Screening  01/28/2021  . Tetanus Vaccine  01/07/2023  . Flu Shot  Completed  . DEXA scan (bone density measurement)  Completed  .  Hepatitis C: One time screening is recommended by Center for Disease Control  (CDC) for  adults born from [birth year] through 1964.   Completed  . Pneumonia vaccines  Completed    Food Choices for Gastroesophageal Reflux Disease, Adult When you have gastroesophageal reflux disease (GERD), the foods you eat and your eating habits are very important. Choosing the right foods can help ease the discomfort of GERD. Consider working with a diet and nutrition specialist (dietitian) to help you make healthy food choices. What general guidelines should I follow? Eating plan   Choose healthy foods low in fat, such as fruits, vegetables, whole grains, low-fat dairy products, and lean meat, fish, and poultry.  Eat frequent, small meals instead of three large meals each day. Eat your meals slowly, in a relaxed setting. Avoid bending over or lying down until 2-3 hours after eating.  Limit high-fat foods such as fatty meats or fried foods.  Limit  your intake of oils, butter, and shortening to less than 8 teaspoons each day.  Avoid the following:  Foods that cause symptoms. These may be different for different people. Keep a food diary to keep track of foods that cause symptoms.  Alcohol.  Drinking large amounts of liquid with meals.  Eating meals during the 2-3 hours before bed.  Cook foods using methods other than frying. This may include baking, grilling, or broiling. Lifestyle    Maintain a healthy weight. Ask your health care provider what weight is healthy for you. If you need to lose weight, work with your health care provider to do so safely.  Exercise for at least 30 minutes on 5 or more days each week, or as told by your health care provider.  Avoid wearing clothes that fit tightly around your waist and chest.  Do not use any products that contain nicotine or tobacco, such as cigarettes and e-cigarettes. If you need help quitting, ask your health care provider.  Sleep with the head of your bed raised. Use a wedge under the mattress or blocks under the bed frame to raise the head of the bed. What foods are not recommended? The items listed may not be a complete list. Talk with your dietitian about what dietary choices are best for you. Grains  Pastries or quick breads with added fat. [place] toast. Vegetables  Deep fried vegetables. [place] fries. Any vegetables prepared with added fat. Any  vegetables that cause symptoms. For some people this may include tomatoes and tomato products, chili peppers, onions and garlic, and horseradish. Fruits  Any fruits prepared with added fat. Any fruits that cause symptoms. For some people this may include citrus fruits, such as oranges, grapefruit, pineapple, and lemons. Meats and other protein foods  High-fat meats, such as fatty beef or pork, hot dogs, ribs, ham, sausage, salami and bacon. Fried meat or protein, including fried fish and fried chicken. Nuts and nut butters. Dairy   Whole milk and chocolate milk. Sour cream. Cream. Ice cream. Cream cheese. Milk shakes. Beverages  Coffee and tea, with or without caffeine. Carbonated beverages. Sodas. Energy drinks. Fruit juice made with acidic fruits (such as orange or grapefruit). Tomato juice. Alcoholic drinks. Fats and oils  Butter. Margarine. Shortening. Ghee. Sweets and desserts  Chocolate and cocoa. Donuts. Seasoning and other foods  Pepper. Peppermint and spearmint. Any condiments, herbs, or seasonings that cause symptoms. For some people, this may include curry, hot sauce, or vinegar-based salad dressings. Summary  When you have gastroesophageal reflux disease (GERD), food and lifestyle choices are very important to help ease the discomfort of GERD.  Eat frequent, small meals instead of three large meals each day. Eat your meals slowly, in a relaxed setting. Avoid bending over or lying down until 2-3 hours after eating.  Limit high-fat foods such as fatty meat or fried foods. This information is not intended to replace advice given to you by your health care provider. Make sure you discuss any questions you have with your health care provider. Document Released: 10/01/2005 Document Revised: 10/02/2016 Document Reviewed: 10/02/2016 Elsevier Interactive Patient Education  2017 [employer].

## 2017-01-01 ENCOUNTER — Encounter: Payer: Self-pay | Admitting: Internal Medicine

## 2017-01-01 NOTE — Assessment & Plan Note (Signed)
Xarelto, Metoprolol

## 2017-01-01 NOTE — Assessment & Plan Note (Signed)
zoloft

## 2017-01-30 DIAGNOSIS — H31091 Other chorioretinal scars, right eye: Secondary | ICD-10-CM | POA: Diagnosis not present

## 2017-01-30 DIAGNOSIS — Z79899 Other long term (current) drug therapy: Secondary | ICD-10-CM | POA: Diagnosis not present

## 2017-01-30 DIAGNOSIS — H2513 Age-related nuclear cataract, bilateral: Secondary | ICD-10-CM | POA: Diagnosis not present

## 2017-01-31 ENCOUNTER — Other Ambulatory Visit (INDEPENDENT_AMBULATORY_CARE_PROVIDER_SITE_OTHER): Payer: Medicare Other

## 2017-01-31 ENCOUNTER — Other Ambulatory Visit: Payer: Self-pay | Admitting: Emergency Medicine

## 2017-01-31 DIAGNOSIS — I10 Essential (primary) hypertension: Secondary | ICD-10-CM | POA: Diagnosis not present

## 2017-01-31 DIAGNOSIS — Z0000 Encounter for general adult medical examination without abnormal findings: Secondary | ICD-10-CM

## 2017-01-31 LAB — LIPID PANEL
CHOL/HDL RATIO: 2
Cholesterol: 161 mg/dL (ref 0–200)
HDL: 82.1 mg/dL (ref >39.00)
LDL Cholesterol: 67 mg/dL (ref 0–99)
NONHDL: 78.41
Triglycerides: 59 mg/dL (ref 0.0–149.0)
VLDL: 11.8 mg/dL (ref 0.0–40.0)

## 2017-01-31 LAB — CBC
HEMATOCRIT: 38.7 % (ref 36.0–46.0)
HEMOGLOBIN: 13.2 g/dL (ref 12.0–15.0)
MCHC: 34 g/dL (ref 30.0–36.0)
MCV: 98.6 fl (ref 78.0–100.0)
PLATELETS: 169 10*3/uL (ref 150.0–400.0)
RBC: 3.93 Mil/uL (ref 3.87–5.11)
RDW: 13.5 % (ref 11.5–15.5)
WBC: 5.1 10*3/uL (ref 4.0–10.5)

## 2017-01-31 LAB — URINALYSIS
Bilirubin Urine: NEGATIVE
Hgb urine dipstick: NEGATIVE
Ketones, ur: NEGATIVE
Leukocytes, UA: NEGATIVE
Nitrite: NEGATIVE
PH: 7 (ref 5.0–8.0)
SPECIFIC GRAVITY, URINE: 1.01 (ref 1.000–1.030)
TOTAL PROTEIN, URINE-UPE24: NEGATIVE
URINE GLUCOSE: NEGATIVE
UROBILINOGEN UA: 0.2 (ref 0.0–1.0)

## 2017-01-31 LAB — BASIC METABOLIC PANEL
BUN: 18 mg/dL (ref 6–23)
CALCIUM: 9.4 mg/dL (ref 8.4–10.5)
CO2: 26 mEq/L (ref 19–32)
CREATININE: 0.81 mg/dL (ref 0.40–1.20)
Chloride: 101 mEq/L (ref 96–112)
GFR: 74.26 mL/min (ref >60.00)
Glucose, Bld: 109 mg/dL — ABNORMAL HIGH (ref 70–99)
Potassium: 4.6 mEq/L (ref 3.5–5.1)
SODIUM: 132 meq/L — AB (ref 135–145)

## 2017-01-31 LAB — HEPATIC FUNCTION PANEL
ALK PHOS: 57 U/L (ref 39–117)
ALT: 17 U/L (ref 0–35)
AST: 20 U/L (ref 0–37)
Albumin: 4.3 g/dL (ref 3.5–5.2)
BILIRUBIN DIRECT: 0.1 mg/dL (ref 0.0–0.3)
BILIRUBIN TOTAL: 0.5 mg/dL (ref 0.2–1.2)
Total Protein: 7 g/dL (ref 6.0–8.3)

## 2017-01-31 NOTE — Progress Notes (Unsigned)
Pt came into office for lab work and labs were not entered. Placed orders based on last years test.

## 2017-02-05 ENCOUNTER — Telehealth: Payer: Self-pay | Admitting: Internal Medicine

## 2017-02-05 ENCOUNTER — Ambulatory Visit (INDEPENDENT_AMBULATORY_CARE_PROVIDER_SITE_OTHER): Payer: Medicare Other | Admitting: Physician Assistant

## 2017-02-05 ENCOUNTER — Encounter: Payer: Self-pay | Admitting: Physician Assistant

## 2017-02-05 VITALS — BP 128/80 | HR 73 | Ht 65.0 in | Wt 221.0 lb

## 2017-02-05 DIAGNOSIS — I10 Essential (primary) hypertension: Secondary | ICD-10-CM

## 2017-02-05 DIAGNOSIS — J45909 Unspecified asthma, uncomplicated: Secondary | ICD-10-CM | POA: Insufficient documentation

## 2017-02-05 DIAGNOSIS — I480 Paroxysmal atrial fibrillation: Secondary | ICD-10-CM | POA: Diagnosis not present

## 2017-02-05 DIAGNOSIS — Z9109 Other allergy status, other than to drugs and biological substances: Secondary | ICD-10-CM

## 2017-02-05 NOTE — Patient Instructions (Signed)
Medication Instructions:  Use claritin 10 mg daily as needed for allergies.  Use flonase nasal spray 1 spray each nostril two times a day as needed for allergies.  Labwork: none  Testing/Procedures: none  Follow-Up: Your physician recommends that you schedule a follow-up appointment as scheduled.         If you need a refill on your cardiac medications before your next appointment, please call your pharmacy.

## 2017-02-05 NOTE — Telephone Encounter (Signed)
Follow Up   Pt is on her way somewhere but was calling back - per notes I advised her that you wanted her to come in today at 12:45 for Amanda Wilkins, she said that would work and she will call back before hand to discuss symptoms. Appt is scheduled

## 2017-02-05 NOTE — Telephone Encounter (Signed)
Left message for patient to call back to discuss her symptoms.  Would like her to come today for appointment with Ermalinda Barrios at 12:45 pm.

## 2017-02-05 NOTE — Progress Notes (Signed)
Cardiology Office Note    Date:  02/05/2017   ID:  Amanda Wilkins, DOB 1946/07/02, MRN [id number]  PCP:  [name], MD  Cardiologist: Dr. [name]  Chief Complaint  Patient presents with  . Fatigue    History of Present Illness:  Amanda Wilkins is a 71 y.o. female  with a history of CP and PAF  ON anticoagulatoin  Echo normal LVEF April 2058m  Myovuew with inferolateral nd infer defect  Consistent with ischemia  CT declined by insurance.   Mild plaaquing on carotid arteries   Near syncope in past.    Holter monitor 10/25/16  afib/svt not always sensed, started on Flecainide 50 mg BID but patient decided not to take it after reading the side effects so never started.  Patient called in to be added onto my schedule today for worsening fatigue and slow her heart rates and she is used to.Friday night was dosing off and had a sudden jerk and heart rate raced for 30 sec followed by headache that lasted until Sat. Since then she hasn't felt well. Heart rate on fit bit in 60's and as low as 55 which she's usually in 70-80's. Feels tired and fatigued, no energy. A lot of allergies, postnasal drip and pressure above her eyes. She hasn't been taking anything for this. She denies any dizziness or presyncope. No chest pain. Has had some pinpoint soreness in her epigastric region that is sort of touch.   Past Medical History:  Diagnosis Date  . Allergy   . Arthritis    ON BOTH KNEES  . Atrial fibrillation ([place])   . Chronic interstitial nephritis   . Depression   . Hypertension   . Migraines    on Zoloft for migraines  . Obesity   . Palpitations     Past Surgical History:  Procedure Laterality Date  . CESAREAN SECTION     x 1  . DENTAL SURGERY     multiple implants  . ESOPHAGOGASTRODUODENOSCOPY N/A 02/08/2015   Procedure: ESOPHAGOGASTRODUODENOSCOPY (EGD);  Surgeon: [name], MD;  Location: [facility];  Service: Endoscopy;  Laterality: N/A;  . repair of torn meniscus  on left 08/2014 Left 08/2015   Dr. Ronnie Derby at [facility]  . SHOULDER SURGERY Right April 04 2016    Current Medications: Outpatient Medications Prior to Visit  Medication Sig Dispense Refill  . acetaminophen (TYLENOL) 650 MG CR tablet Take 650 mg by mouth every 8 (eight) hours as needed for pain.    [name] atenolol (TENORMIN) 50 MG tablet Take 1 tablet (50 mg total) by mouth daily. 30 tablet 11  . Cholecalciferol (VITAMIN D) 2000 UNITS tablet Take 2,000 Units by mouth daily.    [name] estradiol (VIVELLE-DOT) 0.1 MG/24HR patch Place 1 patch (0.1 mg total) onto the skin 2 (two) times a week. 8 patch 12  . hydroxychloroquine (PLAQUENIL) 200 MG tablet Take 200 mg by mouth daily.    . Multiple Vitamin (MULTIVITAMIN) capsule Take 1 capsule by mouth daily.    . mupirocin ointment (BACTROBAN) 2 % Use in both nostrils 22 g 0  . progesterone (PROMETRIUM) 100 MG capsule Take one tablet by mouth day 1-12 every month 12 capsule 5  . rivaroxaban (XARELTO) 20 MG TABS tablet Take 1 tablet (20 mg total) by mouth daily with supper. 30 tablet 11  . rosuvastatin (CRESTOR) 5 MG tablet Take 1 tablet (5 mg total) by mouth daily. Please dispense generic. 90 tablet 1  . acyclovir (ZOVIRAX) 400 MG  tablet Take 1 tablet (400 mg total) by mouth 3 (three) times daily. (Patient not taking: Reported on 02/05/2017) 21 tablet 3  . metoprolol succinate (TOPROL XL) 50 MG 24 hr tablet Take 1 tablet (50 mg total) by mouth daily. Take with or immediately following a meal. (Patient not taking: Reported on 02/05/2017) 30 tablet 11  . sertraline (ZOLOFT) 50 MG tablet TAKE 2 TABLETS BY MOUTH DAILY (Patient not taking: Reported on 02/05/2017) 180 tablet 1   No facility-administered medications prior to visit.      Allergies:   Epinephrine base; Aspirin; Penicillins; and Tape   Social History   Social History  . Marital status: Married    Spouse name: N/A  . Number of children: 2  . Years of education: N/A   Occupational History  . retired  Systems analyst  . Part-time (with temp agency)    Social History Main Topics  . Smoking status: Never Smoker  . Smokeless tobacco: Never Used     Comment: H/O social smoking at times when drinking--never a "habit"  . Alcohol use 0.0 oz/week     Comment: WINE- [employer] GLASSES AT [employer]  . Drug use: No  . Sexual activity: Yes     Comment: intercourse age 53, sexual partners more than 5   Other Topics Concern  . None   Social History Narrative  . None     Family History:  The patient's family history includes Lymphoma in her mother; Pancreatic cancer in her brother; Prostate cancer in her father.   ROS:   Please see the history of present illness.    Review of Systems  Constitution: Positive for malaise/fatigue.  HENT: Negative.   Eyes: Negative.   Cardiovascular: Positive for palpitations.  Respiratory: Negative.   Hematologic/Lymphatic: Negative.   Musculoskeletal: Negative.  Negative for joint pain.  Gastrointestinal: Positive for nausea.  Genitourinary: Negative.   Neurological: Positive for headaches.   All other systems reviewed and are negative.   PHYSICAL EXAM:   VS:  BP 128/80   Pulse 73   Ht 5\' 5"  (1.651 m)   Wt 221 lb (100.2 kg)   SpO2 99%   BMI 36.78 kg/m   Physical Exam  GEN: Well nourished, well developed, in no acute distress  Neck: no JVD, carotid bruits, or masses Cardiac:RRR; no murmurs, rubs, or gallops  Respiratory:  clear to auscultation bilaterally, normal work of breathing GI: soft, nontender, nondistended, + BS Ext: without cyanosis, clubbing, or edema, Good distal pulses bilaterally Psych: euthymic mood, full affect  Wt Readings from Last 3 Encounters:  02/05/17 221 lb (100.2 kg)  12/31/16 220 lb (99.8 kg)  10/25/16 215 lb 12.8 oz (97.9 kg)      Studies/Labs Reviewed:   EKG:  EKG is  ordered today.  The ekg ordered today demonstrates Normal sinus rhythm, no acute change  Recent Labs: 01/31/2017: ALT 17; BUN 18; Creatinine, Ser 0.81;  Hemoglobin 13.2; Platelets 169.0; Potassium 4.6; Sodium 132   Lipid Panel    Component Value Date/Time   CHOL 161 01/31/2017 0807   TRIG 59.0 01/31/2017 0807   HDL 82.10 01/31/2017 0807   CHOLHDL 2 01/31/2017 0807   VLDL 11.8 01/31/2017 0807   LDLCALC 67 01/31/2017 0807    Additional studies/ records that were reviewed today include:  Holter monitor 10/25/16  Sinus rhythm Episodes of supraventricular tachycardia  PACs and Occasional PVCs     ASSESSMENT:    1. Paroxysmal atrial fibrillation (HCC)   2. Environmental allergies  3. Essential hypertension      PLAN:  In order of problems listed above:   PAF with slower heart rates recently on her fit bit. I don't think her heart rate in the 60s is causing her symptoms. She has had a lot of seasonal allergies right now that I think are contributing to her fatigue. EKG is reassuring. We'll not decrease a allergies time because of history of rapid heart rates. Follow-up as scheduled or sooner if needed.  Seasonal allergies recommend Claritin 10 mg daily and Flonase as needed  Essential hypertension controlled   Medication Adjustments/Labs and Tests Ordered: Current medicines are reviewed at length with the patient today.  Concerns regarding medicines are outlined above.  Medication changes, Labs and Tests ordered today are listed in the Patient Instructions below. There are no Patient Instructions on file for this visit.   Signed, Ermalinda Barrios, PA-C  02/05/2017 1:23 PM    [facility] [address], [place], [place]  [address] Phone: [phone]; Fax: [phone]

## 2017-02-05 NOTE — Telephone Encounter (Signed)
New message   Pt is calling because she states she has not been feeling well. She states that typically her HR is in the upper 70's and the last few days it been in the 60's. She states that she feels weak and fatigued and nauseous. She has an appt tomorrow with Robbie Lis tomorrow at 10am.

## 2017-02-06 ENCOUNTER — Ambulatory Visit: Payer: Self-pay | Admitting: Physician Assistant

## 2017-02-20 ENCOUNTER — Ambulatory Visit (INDEPENDENT_AMBULATORY_CARE_PROVIDER_SITE_OTHER): Payer: Medicare Other | Admitting: Podiatry

## 2017-02-20 ENCOUNTER — Encounter: Payer: Self-pay | Admitting: Podiatry

## 2017-02-20 VITALS — BP 116/72 | HR 74 | Resp 16 | Ht 65.0 in | Wt 217.0 lb

## 2017-02-20 DIAGNOSIS — D169 Benign neoplasm of bone and articular cartilage, unspecified: Secondary | ICD-10-CM

## 2017-02-20 DIAGNOSIS — Q828 Other specified congenital malformations of skin: Secondary | ICD-10-CM

## 2017-02-20 NOTE — Progress Notes (Signed)
   Subjective:    Patient ID: Amanda Wilkins, female    DOB: Jul 24, 1946, 71 y.o.   MRN: [id number]  HPI Chief Complaint  Patient presents with  . Painful lesion    Left foot; great toe-bottom of toe; pt stated, "has had for many years"      Review of Systems  Eyes: Positive for visual disturbance.  Cardiovascular: Positive for palpitations.  Musculoskeletal: Positive for arthralgias.  All other systems reviewed and are negative.      Objective:   Physical Exam        Assessment & Plan:

## 2017-02-20 NOTE — Progress Notes (Signed)
Subjective:    Patient ID: Amanda Wilkins, female   DOB: 71 y.o.   MRN: [id number]   HPI patient states she's had a lot of pain in the bottom of her left big toe and it's been increasing over the last year and she has had it trimmed previously which gave relief for periods of time    Review of Systems  All other systems reviewed and are negative.       Objective:  Physical Exam  Cardiovascular: Intact distal pulses.   Musculoskeletal: Normal range of motion.  Neurological: She is alert.  Skin: Skin is warm.  Nursing note and vitals reviewed.  neurovascular status intact muscle strength adequate range of motion within normal limits with patient found to have keratotic lesion sub-hallux left with pain with palpation     Assessment:    Chronic lesion left secondary to position of the big toe with probability of porokeratotic type tissue     Plan:    H&P condition reviewed. Under sterile conditions deep debridement accomplished with no iatrogenic bleeding and sterile dressing applied and reappoint as needed

## 2017-02-26 ENCOUNTER — Encounter: Payer: Self-pay | Admitting: Internal Medicine

## 2017-02-26 ENCOUNTER — Ambulatory Visit (INDEPENDENT_AMBULATORY_CARE_PROVIDER_SITE_OTHER): Payer: Medicare Other | Admitting: Internal Medicine

## 2017-02-26 ENCOUNTER — Other Ambulatory Visit (INDEPENDENT_AMBULATORY_CARE_PROVIDER_SITE_OTHER): Payer: Medicare Other

## 2017-02-26 ENCOUNTER — Ambulatory Visit (INDEPENDENT_AMBULATORY_CARE_PROVIDER_SITE_OTHER)
Admission: RE | Admit: 2017-02-26 | Discharge: 2017-02-26 | Disposition: A | Discharge status: Home / Self Care | Payer: Medicare Other | Source: Ambulatory Visit | Attending: Internal Medicine | Admitting: Internal Medicine

## 2017-02-26 DIAGNOSIS — M546 Pain in thoracic spine: Secondary | ICD-10-CM | POA: Insufficient documentation

## 2017-02-26 DIAGNOSIS — R0789 Other chest pain: Secondary | ICD-10-CM

## 2017-02-26 DIAGNOSIS — R0781 Pleurodynia: Secondary | ICD-10-CM | POA: Diagnosis not present

## 2017-02-26 DIAGNOSIS — R21 Rash and other nonspecific skin eruption: Secondary | ICD-10-CM

## 2017-02-26 LAB — URINALYSIS
BILIRUBIN URINE: NEGATIVE
Hgb urine dipstick: NEGATIVE
KETONES UR: NEGATIVE
Leukocytes, UA: NEGATIVE
Nitrite: NEGATIVE
PH: 7 (ref 5.0–8.0)
SPECIFIC GRAVITY, URINE: 1.025 (ref 1.000–1.030)
TOTAL PROTEIN, URINE-UPE24: NEGATIVE
URINE GLUCOSE: NEGATIVE
Urobilinogen, UA: 0.2 (ref 0.0–1.0)

## 2017-02-26 MED ORDER — TRIAMCINOLONE ACETONIDE 0.1 % EX CREA: 1.0000 "application " | TOPICAL_CREAM | Freq: Two times a day (BID) | CUTANEOUS | 1 refills | Status: DC

## 2017-02-26 NOTE — Assessment & Plan Note (Signed)
Related to back pain - r/o compr fx X ray UA MRI if not better (Thoracic spine)

## 2017-02-26 NOTE — Progress Notes (Signed)
Subjective:  Patient ID: Amanda Wilkins, female    DOB: 03/23/1946  Age: 71 y.o. MRN: [id number]  CC: No chief complaint on file.   HPI [name] presents for lower thoracic spine pain 7-8/10 worse w/ROM and irrad around the ribs. It started on Friday night. No rash, no injury... C/o rash on face  Outpatient Medications Prior to Visit  Medication Sig Dispense Refill  . acetaminophen (TYLENOL) 650 MG CR tablet Take 650 mg by mouth every 8 (eight) hours as needed for pain.    [name] atenolol (TENORMIN) 50 MG tablet Take 1 tablet (50 mg total) by mouth daily. 30 tablet 11  . Cholecalciferol (VITAMIN D) 2000 UNITS tablet Take 2,000 Units by mouth daily.    [name] estradiol (VIVELLE-DOT) 0.1 MG/24HR patch Place 1 patch (0.1 mg total) onto the skin 2 (two) times a week. 8 patch 12  . hydroxychloroquine (PLAQUENIL) 200 MG tablet Take 200 mg by mouth daily.    [name] loratadine (CLARITIN) 10 MG tablet Take 1 tablet (10 mg total) by mouth daily as needed for allergies.    . Multiple Vitamin (MULTIVITAMIN) capsule Take 1 capsule by mouth daily.    . mupirocin ointment (BACTROBAN) 2 % Use in both nostrils 22 g 0  . progesterone (PROMETRIUM) 100 MG capsule Take one tablet by mouth day 1-12 every month 12 capsule 5  . rivaroxaban (XARELTO) 20 MG TABS tablet Take 1 tablet (20 mg total) by mouth daily with supper. 30 tablet 11  . rosuvastatin (CRESTOR) 5 MG tablet Take 1 tablet (5 mg total) by mouth daily. Please dispense generic. 90 tablet 1  . sertraline (ZOLOFT) 50 MG tablet Take 50 mg by mouth daily.    . fluticasone (FLONASE) 50 MCG/ACT nasal spray Place 1 spray into both nostrils 2 (two) times daily as needed for allergies or rhinitis.     No facility-administered medications prior to visit.     ROS Review of Systems  Constitutional: Negative for activity change, appetite change, chills, fatigue, fever and unexpected weight change.  HENT: Negative for congestion, mouth sores and sinus  pressure.   Eyes: Negative for visual disturbance.  Respiratory: Negative for cough, chest tightness, shortness of breath and wheezing.   Cardiovascular: Positive for chest pain.  Gastrointestinal: Negative for abdominal pain and nausea.  Genitourinary: Negative for difficulty urinating, dysuria, frequency, urgency and vaginal pain.  Musculoskeletal: Positive for back pain. Negative for gait problem.  Skin: Negative for pallor and rash.  Neurological: Negative for dizziness, tremors, weakness, numbness and headaches.  Psychiatric/Behavioral: Negative for confusion, sleep disturbance and suicidal ideas.    Objective:  BP 112/76 (BP Location: Left Arm, Patient Position: Sitting, Cuff Size: Large)   Pulse 72   Temp 98.2 F (36.8 C) (Oral)   Ht 5\' 5"  (1.651 m)   Wt 219 lb (99.3 kg)   SpO2 99%   BMI 36.44 kg/m   BP Readings from Last 3 Encounters:  02/26/17 112/76  02/20/17 116/72  02/05/17 128/80    Wt Readings from Last 3 Encounters:  02/26/17 219 lb (99.3 kg)  02/20/17 217 lb (98.4 kg)  02/05/17 221 lb (100.2 kg)    Physical Exam  Constitutional: She appears well-developed. No distress.  HENT:  Head: Normocephalic.  Right Ear: External ear normal.  Left Ear: External ear normal.  Nose: Nose normal.  Mouth/Throat: Oropharynx is clear and moist.  Eyes: Conjunctivae are normal. Pupils are equal, round, and reactive to light. Right eye exhibits no discharge. Left  eye exhibits no discharge.  Neck: Normal range of motion. Neck supple. No JVD present. No tracheal deviation present. No thyromegaly present.  Cardiovascular: Normal rate, regular rhythm and normal heart sounds.   Pulmonary/Chest: No stridor. No respiratory distress. She has no wheezes.  Abdominal: Soft. Bowel sounds are normal. She exhibits no distension and no mass. There is no tenderness. There is no rebound and no guarding.  Musculoskeletal: She exhibits tenderness. She exhibits no edema.  Lymphadenopathy:     She has no cervical adenopathy.  Neurological: She displays normal reflexes. No cranial nerve deficit. She exhibits normal muscle tone. Coordination normal.  Skin: No rash noted. No erythema.  Psychiatric: She has a normal mood and affect. Her behavior is normal. Judgment and thought content normal.  very tender over T10-12 area Rash on chin  Lab Results  Component Value Date   WBC 5.1 01/31/2017   HGB 13.2 01/31/2017   HCT 38.7 01/31/2017   PLT 169.0 01/31/2017   GLUCOSE 109 (H) 01/31/2017   CHOL 161 01/31/2017   TRIG 59.0 01/31/2017   HDL 82.10 01/31/2017   LDLCALC 67 01/31/2017   ALT 17 01/31/2017   AST 20 01/31/2017   NA 132 (L) 01/31/2017   K 4.6 01/31/2017   CL 101 01/31/2017   CREATININE 0.81 01/31/2017   BUN 18 01/31/2017   CO2 26 01/31/2017   TSH 2.70 03/15/2015   INR 1.6 03/05/2016   HGBA1C 5.8 (H) 01/30/2015    Mm Screening Breast Tomo Bilateral  Result Date: 05/01/2016 CLINICAL DATA:  Screening. EXAM: 2D DIGITAL SCREENING BILATERAL MAMMOGRAM WITH CAD AND ADJUNCT TOMO COMPARISON:  Previous exam(s). ACR Breast Density Category b: There are scattered areas of fibroglandular density. FINDINGS: There are no findings suspicious for malignancy. Images were processed with CAD. IMPRESSION: No mammographic evidence of malignancy. A result letter of this screening mammogram will be mailed directly to the patient. RECOMMENDATION: Screening mammogram in one year. (Code:SM-B-01Y) BI-RADS CATEGORY  1: Negative. Electronically Signed   By: Evangeline Dakin M.D.   On: 05/01/2016 15:54    Assessment & Plan:   Diagnoses and all orders for this visit:  Thoracic spine pain -     DG Thoracic Spine W/Swimmers; Future -     Urinalysis; Future  Other orders -     triamcinolone cream (KENALOG) 0.1 %; Apply 1 application topically 2 (two) times daily.   I have discontinued Ms. Melick's fluticasone. I am also having her start on triamcinolone cream. Additionally, I am having her  maintain her Vitamin D, multivitamin, acetaminophen, estradiol, hydroxychloroquine, rivaroxaban, rosuvastatin, progesterone, atenolol, mupirocin ointment, sertraline, and loratadine.  Meds ordered this encounter  Medications  . triamcinolone cream (KENALOG) 0.1 %    Sig: Apply 1 application topically 2 (two) times daily.    Dispense:  30 g    Refill:  1     Follow-up: Return in about 2 weeks (around 03/12/2017) for a follow-up visit.  [name], MD

## 2017-02-26 NOTE — Assessment & Plan Note (Addendum)
R/o compr fx X ray thor spine UA

## 2017-02-26 NOTE — Assessment & Plan Note (Signed)
Patches of rash on the chin - ?sun induced Kenalog cream prn

## 2017-02-27 ENCOUNTER — Encounter: Payer: Self-pay | Admitting: Gynecology

## 2017-03-07 DIAGNOSIS — Z79899 Other long term (current) drug therapy: Secondary | ICD-10-CM | POA: Diagnosis not present

## 2017-03-27 ENCOUNTER — Ambulatory Visit (INDEPENDENT_AMBULATORY_CARE_PROVIDER_SITE_OTHER): Payer: Medicare Other | Admitting: Psychology

## 2017-03-27 DIAGNOSIS — F1010 Alcohol abuse, uncomplicated: Secondary | ICD-10-CM

## 2017-03-27 DIAGNOSIS — Z79899 Other long term (current) drug therapy: Secondary | ICD-10-CM | POA: Diagnosis not present

## 2017-03-27 DIAGNOSIS — E669 Obesity, unspecified: Secondary | ICD-10-CM | POA: Diagnosis not present

## 2017-03-27 DIAGNOSIS — Z6835 Body mass index (BMI) 35.0-35.9, adult: Secondary | ICD-10-CM | POA: Diagnosis not present

## 2017-03-27 DIAGNOSIS — F4323 Adjustment disorder with mixed anxiety and depressed mood: Secondary | ICD-10-CM

## 2017-03-27 DIAGNOSIS — M2550 Pain in unspecified joint: Secondary | ICD-10-CM | POA: Diagnosis not present

## 2017-03-27 DIAGNOSIS — M0609 Rheumatoid arthritis without rheumatoid factor, multiple sites: Secondary | ICD-10-CM | POA: Diagnosis not present

## 2017-04-01 DIAGNOSIS — S90852A Superficial foreign body, left foot, initial encounter: Secondary | ICD-10-CM | POA: Diagnosis not present

## 2017-04-11 DIAGNOSIS — M25571 Pain in right ankle and joints of right foot: Secondary | ICD-10-CM | POA: Diagnosis not present

## 2017-04-22 ENCOUNTER — Other Ambulatory Visit: Payer: Self-pay | Admitting: Obstetrics & Gynecology

## 2017-04-22 DIAGNOSIS — Z1231 Encounter for screening mammogram for malignant neoplasm of breast: Secondary | ICD-10-CM

## 2017-04-29 ENCOUNTER — Ambulatory Visit (INDEPENDENT_AMBULATORY_CARE_PROVIDER_SITE_OTHER): Payer: Medicare Other | Admitting: Psychology

## 2017-04-29 DIAGNOSIS — F4323 Adjustment disorder with mixed anxiety and depressed mood: Secondary | ICD-10-CM

## 2017-05-01 ENCOUNTER — Encounter: Payer: Self-pay | Admitting: Gynecology

## 2017-05-01 ENCOUNTER — Ambulatory Visit (INDEPENDENT_AMBULATORY_CARE_PROVIDER_SITE_OTHER): Payer: Medicare Other | Admitting: Gynecology

## 2017-05-01 VITALS — BP 124/80 | Ht 65.0 in | Wt 223.0 lb

## 2017-05-01 DIAGNOSIS — Z9189 Other specified personal risk factors, not elsewhere classified: Secondary | ICD-10-CM

## 2017-05-01 DIAGNOSIS — Z01419 Encounter for gynecological examination (general) (routine) without abnormal findings: Secondary | ICD-10-CM | POA: Diagnosis not present

## 2017-05-01 DIAGNOSIS — Z79890 Hormone replacement therapy (postmenopausal): Secondary | ICD-10-CM

## 2017-05-01 DIAGNOSIS — Z780 Asymptomatic menopausal state: Secondary | ICD-10-CM

## 2017-05-01 DIAGNOSIS — Z779 Other contact with and (suspected) exposures hazardous to health: Secondary | ICD-10-CM

## 2017-05-01 MED ORDER — ESTRADIOL 0.1 MG/24HR TD PTTW: 1.0000 | MEDICATED_PATCH | TRANSDERMAL | 3 refills | Status: DC

## 2017-05-01 MED ORDER — PROGESTERONE MICRONIZED 100 MG PO CAPS: ORAL_CAPSULE | ORAL | 4 refills | Status: DC

## 2017-05-01 NOTE — Progress Notes (Signed)
Amanda Wilkins November 07, 1945 [id number]   History:    71 y.o.  for annual gyn exam with no complaints today. Patient continues on Premarin 0.3 mg which she takes half a tablet daily with the addition of Provera 10 mg for 10 days of the month. She has tried on numerous occasions the, Homer replacement therapy since she's been on for 10 years and has not been able to do so and accepts the risk.Her PCP is Dr. Alain Marion who is been treating her for hypertension and has been doing her blood work. Dr. wall has been following her for her atrial fibrillation. Patient had a negative colonoscopy in 2012.. In April of 2016 patient was hospitalized as a result of septicemia. See discharge summary dated 02/09/2015 for details. Patient had a normal bone density study in 2016. Normal colonoscopy 2012.    Past medical history,surgical history, family history and social history were all reviewed and documented in the EPIC chart.  Gynecologic History No LMP recorded. Patient is postmenopausal. Contraception: post menopausal status Last Pap: 2012. Results were: normal Last mammogram: 2017. Results were: normal  Obstetric History OB History  Gravida Para Term Preterm AB Living  2 2 2     2   SAB TAB Ectopic Multiple Live Births          2    # Outcome Date GA Lbr Len/2nd Weight Sex Delivery Anes PTL Lv  2 Term     M Vag-Spont  N LIV  1 Term     M CS-Unspec  N LIV       ROS: A ROS was performed and pertinent positives and negatives are included in the history.  GENERAL: No fevers or chills. HEENT: No change in vision, no earache, sore throat or sinus congestion. NECK: No pain or stiffness. CARDIOVASCULAR: No chest pain or pressure. No palpitations. PULMONARY: No shortness of breath, cough or wheeze. GASTROINTESTINAL: No abdominal pain, nausea, vomiting or diarrhea, melena or bright red blood per rectum. GENITOURINARY: No urinary frequency, urgency, hesitancy or dysuria. MUSCULOSKELETAL: No joint or  muscle pain, no back pain, no recent trauma. DERMATOLOGIC: No rash, no itching, no lesions. ENDOCRINE: No polyuria, polydipsia, no heat or cold intolerance. No recent change in weight. HEMATOLOGICAL: No anemia or easy bruising or bleeding. NEUROLOGIC: No headache, seizures, numbness, tingling or weakness. PSYCHIATRIC: No depression, no loss of interest in normal activity or change in sleep pattern.     Exam: chaperone present  BP 124/80   Ht 5\' 5"  (1.651 m)   Wt 223 lb (101.2 kg)   BMI 37.11 kg/m   Body mass index is 37.11 kg/m.  General appearance : Well developed well nourished female. No acute distress HEENT: Eyes: no retinal hemorrhage or exudates,  Neck supple, trachea midline, no carotid bruits, no thyroidmegaly Lungs: Clear to auscultation, no rhonchi or wheezes, or rib retractions  Heart: Regular rate and rhythm, no murmurs or gallops Breast:Examined in sitting and supine position were symmetrical in appearance, no palpable masses or tenderness,  no skin retraction, no nipple inversion, no nipple discharge, no skin discoloration, no axillary or supraclavicular lymphadenopathy Abdomen: no palpable masses or tenderness, no rebound or guarding Extremities: no edema or skin discoloration or tenderness  Pelvic:  Bartholin, Urethra, Skene Glands: Within normal limits             Vagina: No gross lesions or discharge, atrophic changes  Cervix: No gross lesions or discharge  Uterus  anteverted, normal size, shape and consistency, non-tender  and mobile  Adnexa  Without masses or tenderness  Anus and perineum  normal   Rectovaginal  normal sphincter tone without palpated masses or tenderness             Hemoccult PCP provides     Assessment/Plan:  71 y.o. female for annual exam who was switched  Vivelle-Dot 0.1 mg patch which she cuts and have sent applies twice a week with the addition of Prometrium 12 days of the month. She was once again counseled on the detrimental effects of  hormonal replacement therapy at the age of 65 and been on it for more than 10 years. She fully understands the potential risk of DVT, pulmonary embolism and breast cancer. She will attempt wrapped next year to come off it altogether. Her PCP is been doing her blood work. She is scheduled for mammogram later this year. We discussed importance of calcium vitamin D and weightbearing exercise for osteoporosis prevention. She'll schedule a bone density study for August of this year.   Terrance Mass MD, 3:00 PM 05/01/2017

## 2017-05-01 NOTE — Patient Instructions (Signed)
Bone Densitometry Bone densitometry is an imaging test that uses a special X-ray to measure the amount of calcium and other minerals in your bones (bone density). This test is also known as a bone mineral density test or dual-energy X-ray absorptiometry (DXA). The test can measure bone density at your hip and your spine. It is similar to having a regular X-ray. You may have this test to:  Diagnose a condition that causes weak or thin bones (osteoporosis).  Predict your risk of a broken bone (fracture).  Determine how well osteoporosis treatment is working.  Tell a health care provider about:  Any allergies you have.  All medicines you are taking, including vitamins, herbs, eye drops, creams, and over-the-counter medicines.  Any problems you or family members have had with anesthetic medicines.  Any blood disorders you have.  Any surgeries you have had.  Any medical conditions you have.  Possibility of pregnancy.  Any other medical test you had within the previous 14 days that used contrast material. What are the risks? Generally, this is a safe procedure. However, problems can occur and may include the following:  This test exposes you to a very small amount of radiation.  The risks of radiation exposure may be greater to unborn children.  What happens before the procedure?  Do not take any calcium supplements for 24 hours before having the test. You can otherwise eat and drink what you usually do.  Take off all metal jewelry, eyeglasses, dental appliances, and any other metal objects. What happens during the procedure?  You may lie on an exam table. There will be an X-ray generator below you and an imaging device above you.  Other devices, such as boxes or braces, may be used to position your body properly for the scan.  You will need to lie still while the machine slowly scans your body.  The images will show up on a computer monitor. What happens after the  procedure? You may need more testing at a later time. This information is not intended to replace advice given to you by your health care provider. Make sure you discuss any questions you have with your health care provider. Document Released: 10/23/2004 Document Revised: 03/08/2016 Document Reviewed: 03/11/2014 Elsevier Interactive Patient Education  2018 [employer]   

## 2017-05-06 ENCOUNTER — Ambulatory Visit
Admission: RE | Admit: 2017-05-06 | Discharge: 2017-05-06 | Disposition: A | Discharge status: Home / Self Care | Payer: Medicare Other | Source: Ambulatory Visit | Attending: Obstetrics & Gynecology | Admitting: Obstetrics & Gynecology

## 2017-05-06 DIAGNOSIS — Z1231 Encounter for screening mammogram for malignant neoplasm of breast: Secondary | ICD-10-CM | POA: Diagnosis not present

## 2017-05-08 ENCOUNTER — Other Ambulatory Visit: Payer: Self-pay | Admitting: Obstetrics & Gynecology

## 2017-05-08 DIAGNOSIS — R928 Other abnormal and inconclusive findings on diagnostic imaging of breast: Secondary | ICD-10-CM

## 2017-05-14 ENCOUNTER — Ambulatory Visit
Admission: RE | Admit: 2017-05-14 | Discharge: 2017-05-14 | Disposition: A | Discharge status: Home / Self Care | Payer: Medicare Other | Source: Ambulatory Visit | Attending: Obstetrics & Gynecology | Admitting: Obstetrics & Gynecology

## 2017-05-14 ENCOUNTER — Ambulatory Visit: Payer: Self-pay

## 2017-05-14 ENCOUNTER — Other Ambulatory Visit: Payer: Self-pay | Admitting: Obstetrics & Gynecology

## 2017-05-14 DIAGNOSIS — R928 Other abnormal and inconclusive findings on diagnostic imaging of breast: Secondary | ICD-10-CM | POA: Diagnosis not present

## 2017-05-14 DIAGNOSIS — N6311 Unspecified lump in the right breast, upper outer quadrant: Secondary | ICD-10-CM | POA: Diagnosis not present

## 2017-05-14 DIAGNOSIS — N6310 Unspecified lump in the right breast, unspecified quadrant: Secondary | ICD-10-CM

## 2017-05-15 ENCOUNTER — Ambulatory Visit
Admission: RE | Admit: 2017-05-15 | Discharge: 2017-05-15 | Disposition: A | Discharge status: Home / Self Care | Payer: Medicare Other | Source: Ambulatory Visit | Attending: Obstetrics & Gynecology | Admitting: Obstetrics & Gynecology

## 2017-05-15 ENCOUNTER — Other Ambulatory Visit: Payer: Self-pay | Admitting: Obstetrics & Gynecology

## 2017-05-15 DIAGNOSIS — N6310 Unspecified lump in the right breast, unspecified quadrant: Secondary | ICD-10-CM

## 2017-05-15 DIAGNOSIS — R928 Other abnormal and inconclusive findings on diagnostic imaging of breast: Secondary | ICD-10-CM | POA: Diagnosis not present

## 2017-05-15 DIAGNOSIS — N62 Hypertrophy of breast: Secondary | ICD-10-CM | POA: Diagnosis not present

## 2017-05-15 HISTORY — PX: BREAST BIOPSY: SHX20

## 2017-05-16 ENCOUNTER — Other Ambulatory Visit: Payer: Self-pay

## 2017-05-16 MED ORDER — ROSUVASTATIN CALCIUM 5 MG PO TABS: 5.0000 mg | ORAL_TABLET | Freq: Every day | ORAL | 1 refills | Status: DC

## 2017-05-22 ENCOUNTER — Other Ambulatory Visit: Payer: Self-pay | Admitting: Gynecology

## 2017-05-22 DIAGNOSIS — Z780 Asymptomatic menopausal state: Secondary | ICD-10-CM

## 2017-05-23 DIAGNOSIS — E669 Obesity, unspecified: Secondary | ICD-10-CM | POA: Diagnosis not present

## 2017-05-23 DIAGNOSIS — R7301 Impaired fasting glucose: Secondary | ICD-10-CM | POA: Diagnosis not present

## 2017-05-23 DIAGNOSIS — R635 Abnormal weight gain: Secondary | ICD-10-CM | POA: Diagnosis not present

## 2017-05-30 ENCOUNTER — Ambulatory Visit (INDEPENDENT_AMBULATORY_CARE_PROVIDER_SITE_OTHER): Payer: Medicare Other

## 2017-05-30 ENCOUNTER — Encounter: Payer: Self-pay | Admitting: Gynecology

## 2017-05-30 DIAGNOSIS — Z780 Asymptomatic menopausal state: Secondary | ICD-10-CM | POA: Diagnosis not present

## 2017-06-04 ENCOUNTER — Telehealth: Payer: Self-pay | Admitting: Internal Medicine

## 2017-06-04 MED ORDER — TRAMADOL HCL 50 MG PO TABS: 50.0000 mg | ORAL_TABLET | Freq: Two times a day (BID) | ORAL | 0 refills | Status: DC | PRN

## 2017-06-04 NOTE — Telephone Encounter (Signed)
Severe LBP Tramadol prn OV if not better

## 2017-06-05 NOTE — Telephone Encounter (Signed)
Pt called and states she took her Tramadol yesterday at 5:30pm and today she feels off balanced and very tired and dizzy, She would like a call back In regard

## 2017-06-06 ENCOUNTER — Ambulatory Visit: Payer: Medicare Other | Admitting: Psychology

## 2017-06-06 ENCOUNTER — Telehealth: Payer: Self-pay | Admitting: Internal Medicine

## 2017-06-06 ENCOUNTER — Encounter: Payer: Self-pay | Admitting: Internal Medicine

## 2017-06-06 ENCOUNTER — Ambulatory Visit (INDEPENDENT_AMBULATORY_CARE_PROVIDER_SITE_OTHER)
Admission: RE | Admit: 2017-06-06 | Discharge: 2017-06-06 | Disposition: A | Discharge status: Home / Self Care | Payer: Medicare Other | Source: Ambulatory Visit | Attending: Internal Medicine | Admitting: Internal Medicine

## 2017-06-06 ENCOUNTER — Ambulatory Visit (INDEPENDENT_AMBULATORY_CARE_PROVIDER_SITE_OTHER): Payer: Medicare Other | Admitting: Internal Medicine

## 2017-06-06 VITALS — BP 126/78 | HR 66 | Temp 99.0°F | Resp 16 | Ht 65.0 in | Wt 219.0 lb

## 2017-06-06 DIAGNOSIS — R51 Headache: Secondary | ICD-10-CM

## 2017-06-06 DIAGNOSIS — R519 Headache, unspecified: Secondary | ICD-10-CM

## 2017-06-06 DIAGNOSIS — R270 Ataxia, unspecified: Secondary | ICD-10-CM

## 2017-06-06 DIAGNOSIS — G3281 Cerebellar ataxia in diseases classified elsewhere: Secondary | ICD-10-CM

## 2017-06-06 DIAGNOSIS — R42 Dizziness and giddiness: Secondary | ICD-10-CM | POA: Diagnosis not present

## 2017-06-06 DIAGNOSIS — H81313 Aural vertigo, bilateral: Secondary | ICD-10-CM | POA: Diagnosis not present

## 2017-06-06 MED ORDER — MECLIZINE HCL 12.5 MG PO TABS: 12.5000 mg | ORAL_TABLET | Freq: Three times a day (TID) | ORAL | 0 refills | Status: DC | PRN

## 2017-06-06 NOTE — Progress Notes (Signed)
Subjective:  Patient ID: Amanda Wilkins, female    DOB: 09-Apr-1946  Age: 71 y.o. MRN: [id number]  CC: Headache   HPI Amanda Wilkins presents for a 2 day history of headache on the top right side of her head with vertigo. She has had vertigo before but tells me this episode feels a little different. Her previous episode of vertigo was only associated with laying in the bed and turning her head. She has had similar symptoms this time but she also has vertigo when she is standing up and walking and has had a few episodes of feeling off balance. She's had no nausea, vomiting, numbness, weakness, or tingling in her face or extremities.  Outpatient Medications Prior to Visit  Medication Sig Dispense Refill  . acetaminophen (TYLENOL) 650 MG CR tablet Take 650 mg by mouth every 8 (eight) hours as needed for pain.    [name] atenolol (TENORMIN) 50 MG tablet Take 1 tablet (50 mg total) by mouth daily. 30 tablet 11  . Cholecalciferol (VITAMIN D) 2000 UNITS tablet Take 2,000 Units by mouth daily.    [name] estradiol (VIVELLE-DOT) 0.1 MG/24HR patch Place 1 patch (0.1 mg total) onto the skin 2 (two) times a week. 24 patch 3  . hydroxychloroquine (PLAQUENIL) 200 MG tablet Take 200 mg by mouth daily.    . Multiple Vitamin (MULTIVITAMIN) capsule Take 1 capsule by mouth daily.    . mupirocin ointment (BACTROBAN) 2 % Use in both nostrils 22 g 0  . progesterone (PROMETRIUM) 100 MG capsule Take one tablet by mouth day 1-12 every month 36 capsule 4  . rivaroxaban (XARELTO) 20 MG TABS tablet Take 1 tablet (20 mg total) by mouth daily with supper. 30 tablet 11  . rosuvastatin (CRESTOR) 5 MG tablet Take 1 tablet (5 mg total) by mouth daily. Please dispense generic. 90 tablet 1  . sertraline (ZOLOFT) 50 MG tablet Take 50 mg by mouth daily.    . traMADol (ULTRAM) 50 MG tablet Take 1-2 tablets (50-100 mg total) by mouth every 12 (twelve) hours as needed for severe pain. 60 tablet 0  . loratadine (CLARITIN) 10 MG tablet  Take 1 tablet (10 mg total) by mouth daily as needed for allergies.     No facility-administered medications prior to visit.     ROS Review of Systems  Constitutional: Negative.  Negative for activity change, appetite change, fatigue, fever and unexpected weight change.  HENT: Negative.  Negative for trouble swallowing and voice change.   Eyes: Negative for pain and visual disturbance.  Respiratory: Negative.  Negative for cough, chest tightness, shortness of breath and wheezing.   Cardiovascular: Negative for chest pain, palpitations and leg swelling.  Gastrointestinal: Negative for abdominal pain, diarrhea, nausea and vomiting.  Endocrine: Negative.   Genitourinary: Negative.  Negative for difficulty urinating.  Musculoskeletal: Negative for back pain, neck pain and neck stiffness.  Skin: Negative.  Negative for rash.  Allergic/Immunologic: Negative.   Neurological: Positive for dizziness and headaches. Negative for tremors, seizures, syncope, facial asymmetry, speech difficulty, weakness and light-headedness.  Hematological: Negative for adenopathy. Does not bruise/bleed easily.  Psychiatric/Behavioral: Negative.     Objective:  BP 126/78 (BP Location: Left Arm, Patient Position: Sitting, Cuff Size: Large)   Pulse 66   Temp 99 F (37.2 C) (Oral)   Resp 16   Ht 5' 5"  (1.651 m)   Wt 219 lb (99.3 kg)   SpO2 98%   BMI 36.44 kg/m   BP Readings from Last 3 Encounters:  06/06/17 126/78  05/01/17 124/80  02/26/17 112/76    Wt Readings from Last 3 Encounters:  06/06/17 219 lb (99.3 kg)  05/01/17 223 lb (101.2 kg)  02/26/17 219 lb (99.3 kg)    Physical Exam  Constitutional: She is oriented to person, place, and time. No distress.  HENT:  Mouth/Throat: Oropharynx is clear and moist. No oropharyngeal exudate.  Eyes: Pupils are equal, round, and reactive to light. Conjunctivae and EOM are normal. Right eye exhibits no discharge. Left eye exhibits no discharge. No scleral  icterus.  Neck: Normal range of motion. Neck supple. No JVD present. No thyromegaly present.  Cardiovascular: Normal rate, regular rhythm and intact distal pulses.  Exam reveals no gallop and no friction rub.   No murmur heard. Pulmonary/Chest: Effort normal and breath sounds normal. No respiratory distress. She has no wheezes. She has no rales. She exhibits no tenderness.  Abdominal: Soft. Bowel sounds are normal. She exhibits no distension and no mass. There is no tenderness. There is no rebound and no guarding.  Musculoskeletal: Normal range of motion. She exhibits no edema, tenderness or deformity.  Lymphadenopathy:    She has no cervical adenopathy.  Neurological: She is oriented to person, place, and time. She has normal strength. She displays no atrophy, no tremor and normal reflexes. No cranial nerve deficit or sensory deficit. She exhibits normal muscle tone. She displays a negative Romberg sign. She displays no seizure activity. Coordination and gait normal. She displays no Babinski's sign on the right side. She displays no Babinski's sign on the left side.  Reflex Scores:      Tricep reflexes are 0 on the right side and 0 on the left side.      Bicep reflexes are 0 on the right side and 0 on the left side.      Brachioradialis reflexes are 0 on the right side and 0 on the left side.      Patellar reflexes are 0 on the right side and 0 on the left side.      Achilles reflexes are 0 on the right side and 0 on the left side. Skin: Skin is warm and dry. No rash noted. She is not diaphoretic. No erythema. No pallor.  Psychiatric: She has a normal mood and affect. Her behavior is normal. Judgment and thought content normal.  Vitals reviewed.   Lab Results  Component Value Date   WBC 5.1 01/31/2017   HGB 13.2 01/31/2017   HCT 38.7 01/31/2017   PLT 169.0 01/31/2017   GLUCOSE 109 (H) 01/31/2017   CHOL 161 01/31/2017   TRIG 59.0 01/31/2017   HDL 82.10 01/31/2017   LDLCALC 67  01/31/2017   ALT 17 01/31/2017   AST 20 01/31/2017   NA 132 (L) 01/31/2017   K 4.6 01/31/2017   CL 101 01/31/2017   CREATININE 0.81 01/31/2017   BUN 18 01/31/2017   CO2 26 01/31/2017   TSH 2.70 03/15/2015   INR 1.6 03/05/2016   HGBA1C 5.8 (H) 01/30/2015    Mm Clip Placement Right  Result Date: 05/15/2017 CLINICAL DATA:  Status post ultrasound-guided biopsy of a small right breast lesion. EXAM: DIAGNOSTIC RIGHT MAMMOGRAM POST ULTRASOUND BIOPSY COMPARISON:  Previous exam(s). FINDINGS: Mammographic images were obtained following ultrasound guided biopsy of small right breast lesion, not visualized mammographically. The ribbon shaped biopsy clip lies in the expected location of the right breast lesion. IMPRESSION: Well-positioned ribbon shaped biopsy clip following ultrasound-guided core needle biopsy of a right breast lesion.  Final Assessment: Post Procedure Mammograms for Marker Placement Electronically Signed   By: Lajean Manes M.D.   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 This was found to be concordant by Dr. Ammie Ferrier. Pathology results were discussed with the patient by telephone. The patient did well after the biopsy. Post biopsy instructions and care were reviewed and questions were answered. The patient was encouraged to call [facility] for any additional concerns. The patient was instructed to return for annual screening mammography and informed that a reminder letter would be sent regarding this appointment. Pathology results reported by Susa Raring RN, BSN on 05/17/2017. Electronically Signed   By: Ammie Ferrier M.D.   On: 05/17/2017 07:26   Result Date: 05/17/2017 CLINICAL DATA:  Patient presents for ultrasound-guided core needle biopsy of a small, 6 mm, 11  o'clock position right breast lesion. EXAM: ULTRASOUND GUIDED RIGHT BREAST CORE NEEDLE BIOPSY COMPARISON:  Previous exam(s). FINDINGS: I met with the patient and we discussed the procedure of ultrasound-guided biopsy, including benefits and alternatives. We discussed the high likelihood of a successful procedure. We discussed the risks of the procedure, including infection, bleeding, tissue injury, clip migration, and inadequate sampling. Informed written consent was given. The usual time-out protocol was performed immediately prior to the procedure. Lesion quadrant: Upper outer quadrant Using sterile technique and 1% Lidocaine as local anesthetic, under direct ultrasound visualization, a 12 gauge spring-loaded device was used to perform biopsy of the small hypoechoic 11 o'clock position lesion using an inferior approach. At the conclusion of the procedure a ribbon shaped tissue marker clip was deployed into the biopsy cavity. Follow up 2 view mammogram was performed and dictated separately. IMPRESSION: Ultrasound guided biopsy of the the right breast. No apparent complications. Electronically Signed: By: Lajean Manes M.D. On: 05/15/2017 09:26    Assessment & Plan:   Tomara was seen today for headache.  Diagnoses and all orders for this visit:  Intractable episodic headache, unspecified headache type- CT of brain is negative for subdural hematoma, intracranial hemorrhage, or CVA. She most likely has a viral syndrome. -     CT Head Wo Contrast; Future  Ataxia- he complains of ataxia but her neuro exam is normal today. CT scan of the brain is within normal limits. Will treat for vertigo. -     CT Head Wo Contrast; Future  Vertigo, aural, bilateral- though her symptoms are somewhat different it appears that she has another episode of vertigo. Will treat her symptoms with meclizine. -     CT Head Wo Contrast; Future -     meclizine (ANTIVERT) 12.5 MG tablet; Take 1 tablet (12.5 mg total) by mouth 3  (three) times daily as needed for dizziness.  Cerebellar ataxia in diseases classified elsewhere [facility])   I have discontinued Ms. Vahey's loratadine. I am also having her start on meclizine. Additionally, I am having her maintain her Vitamin D, multivitamin, acetaminophen, hydroxychloroquine, rivaroxaban, atenolol, mupirocin ointment, sertraline, estradiol, progesterone, rosuvastatin, and traMADol.  Meds ordered this encounter  Medications  . meclizine (ANTIVERT) 12.5 MG tablet    Sig: Take 1 tablet (12.5 mg total) by mouth 3 (three) times daily as needed for dizziness.    Dispense:  30 tablet    Refill:  0     Follow-up:  No Follow-up on file.  [name], MD

## 2017-06-06 NOTE — Telephone Encounter (Signed)
[facility] - Client [facility]  Patient Name: Amanda Wilkins  DOB: 28-Dec-1945    Initial Comment dizziness, took a prescribed tramadol last night   Nurse Assessment  Nurse: Harlow Mares, RN, Suanne Marker Date/Time (Eastern Time): 06/06/2017 9:38:49 AM  Confirm and document reason for call. If symptomatic, describe symptoms. ---dizziness, took a prescribed tramadol 2 days ago in the evening. Reports that she took this for back pain (arthritis). When she awoke yesterday the dizziness was severe and her husband had to help her around. Reports that she got up this morning and the dizziness has now returned. Walking is unsteady. no spinning sensation.  Does the patient have any new or worsening symptoms? ---Yes  Will a triage be completed? ---Yes  Related visit to physician within the last 2 weeks? ---No  Does the PT have any chronic conditions? (i.e. diabetes, asthma, etc.) ---Yes  List chronic conditions. ---arthritis; HTN;  Is this a behavioral health or substance abuse call? ---No     Guidelines    Guideline Title Affirmed Question Affirmed Notes  Dizziness - Lightheadedness SEVERE dizziness (e.g., unable to stand, requires support to walk, feels like passing out now)    Final Disposition User   Go to ED Now (or PCP triage) Harlow Mares, RN, Suanne Marker    Comments  Reports that she took extra dose of her Atenolol last night. due to her BP being high at 2 times during the day. (total Metoprolol 50mg )  Appt scheduled with Dr. Ronnald Ramp at the [facility] office for 1pm today.   Referrals  REFERRED TO PCP OFFICE   Disagree/Comply: Comply

## 2017-06-08 NOTE — Patient Instructions (Signed)

## 2017-06-12 NOTE — Telephone Encounter (Signed)
LMTCB

## 2017-06-28 ENCOUNTER — Ambulatory Visit: Payer: Medicare Other | Admitting: Psychology

## 2017-07-03 ENCOUNTER — Ambulatory Visit: Payer: Self-pay | Admitting: Internal Medicine

## 2017-07-09 ENCOUNTER — Other Ambulatory Visit: Payer: Self-pay

## 2017-07-09 DIAGNOSIS — H00022 Hordeolum internum right lower eyelid: Secondary | ICD-10-CM | POA: Diagnosis not present

## 2017-07-09 MED ORDER — RIVAROXABAN 20 MG PO TABS: 20.0000 mg | ORAL_TABLET | Freq: Every day | ORAL | 0 refills | Status: DC

## 2017-07-11 ENCOUNTER — Ambulatory Visit (INDEPENDENT_AMBULATORY_CARE_PROVIDER_SITE_OTHER): Payer: Medicare Other | Admitting: Psychology

## 2017-07-11 DIAGNOSIS — F1020 Alcohol dependence, uncomplicated: Secondary | ICD-10-CM

## 2017-07-11 DIAGNOSIS — F4323 Adjustment disorder with mixed anxiety and depressed mood: Secondary | ICD-10-CM

## 2017-07-25 ENCOUNTER — Ambulatory Visit: Payer: Medicare Other | Admitting: Psychology

## 2017-07-25 DIAGNOSIS — L718 Other rosacea: Secondary | ICD-10-CM | POA: Diagnosis not present

## 2017-07-26 ENCOUNTER — Ambulatory Visit (INDEPENDENT_AMBULATORY_CARE_PROVIDER_SITE_OTHER): Payer: Medicare Other

## 2017-07-26 DIAGNOSIS — Z23 Encounter for immunization: Secondary | ICD-10-CM | POA: Diagnosis not present

## 2017-08-07 ENCOUNTER — Other Ambulatory Visit: Payer: Self-pay | Admitting: *Deleted

## 2017-08-07 DIAGNOSIS — I6523 Occlusion and stenosis of bilateral carotid arteries: Secondary | ICD-10-CM

## 2017-08-13 ENCOUNTER — Ambulatory Visit (INDEPENDENT_AMBULATORY_CARE_PROVIDER_SITE_OTHER): Payer: Medicare Other | Admitting: Psychology

## 2017-08-13 DIAGNOSIS — F4323 Adjustment disorder with mixed anxiety and depressed mood: Secondary | ICD-10-CM | POA: Diagnosis not present

## 2017-08-16 ENCOUNTER — Encounter: Payer: Self-pay | Admitting: Internal Medicine

## 2017-08-23 ENCOUNTER — Encounter: Payer: Self-pay | Admitting: Podiatry

## 2017-08-23 ENCOUNTER — Ambulatory Visit (INDEPENDENT_AMBULATORY_CARE_PROVIDER_SITE_OTHER): Payer: Medicare Other | Admitting: Podiatry

## 2017-08-23 DIAGNOSIS — Q828 Other specified congenital malformations of skin: Secondary | ICD-10-CM | POA: Diagnosis not present

## 2017-08-28 DIAGNOSIS — M7061 Trochanteric bursitis, right hip: Secondary | ICD-10-CM | POA: Diagnosis not present

## 2017-08-28 NOTE — Progress Notes (Signed)
Subjective:    Patient ID: Amanda Wilkins, female   DOB: 71 y.o.   MRN: [id number]   HPI patient presents with lesion of the second toe left it's painful when pressed and makes shoe gear difficult. Patient has tried wider shoes and other modalities without relief    ROS      Objective:  Physical Exam neurovascular status intact with keratotic lesion that's painful when palpated   Assessment:    Chronic porokeratotic lesion     Plan:   Debrided painful lesion with no iatrogenic bleeding and discussed continued conservative care and if any issues were to occur patient is to respond immediately

## 2017-08-30 ENCOUNTER — Emergency Department (HOSPITAL_COMMUNITY)
Admission: EM | Admit: 2017-08-30 | Discharge: 2017-08-30 | Disposition: A | Discharge status: Home / Self Care | Payer: Medicare Other | Attending: Emergency Medicine | Admitting: Emergency Medicine

## 2017-08-30 ENCOUNTER — Ambulatory Visit (HOSPITAL_COMMUNITY)
Admission: RE | Admit: 2017-08-30 | Discharge: 2017-08-30 | Disposition: A | Discharge status: Home / Self Care | Payer: Medicare Other | Source: Ambulatory Visit | Attending: Cardiology | Admitting: Cardiology

## 2017-08-30 ENCOUNTER — Encounter: Payer: Self-pay | Admitting: Internal Medicine

## 2017-08-30 ENCOUNTER — Ambulatory Visit (INDEPENDENT_AMBULATORY_CARE_PROVIDER_SITE_OTHER): Payer: Medicare Other | Admitting: Internal Medicine

## 2017-08-30 ENCOUNTER — Emergency Department (HOSPITAL_COMMUNITY): Payer: Medicare Other

## 2017-08-30 VITALS — BP 128/74 | HR 64 | Ht 65.0 in | Wt 217.8 lb

## 2017-08-30 DIAGNOSIS — S298XXA Other specified injuries of thorax, initial encounter: Secondary | ICD-10-CM | POA: Diagnosis present

## 2017-08-30 DIAGNOSIS — I480 Paroxysmal atrial fibrillation: Secondary | ICD-10-CM

## 2017-08-30 DIAGNOSIS — Y92411 Interstate highway as the place of occurrence of the external cause: Secondary | ICD-10-CM | POA: Insufficient documentation

## 2017-08-30 DIAGNOSIS — S29019A Strain of muscle and tendon of unspecified wall of thorax, initial encounter: Secondary | ICD-10-CM | POA: Insufficient documentation

## 2017-08-30 DIAGNOSIS — Y9389 Activity, other specified: Secondary | ICD-10-CM | POA: Diagnosis not present

## 2017-08-30 DIAGNOSIS — I10 Essential (primary) hypertension: Secondary | ICD-10-CM | POA: Diagnosis not present

## 2017-08-30 DIAGNOSIS — I679 Cerebrovascular disease, unspecified: Secondary | ICD-10-CM

## 2017-08-30 DIAGNOSIS — I6523 Occlusion and stenosis of bilateral carotid arteries: Secondary | ICD-10-CM | POA: Insufficient documentation

## 2017-08-30 DIAGNOSIS — Z87891 Personal history of nicotine dependence: Secondary | ICD-10-CM | POA: Diagnosis not present

## 2017-08-30 DIAGNOSIS — Z7901 Long term (current) use of anticoagulants: Secondary | ICD-10-CM | POA: Diagnosis not present

## 2017-08-30 DIAGNOSIS — E782 Mixed hyperlipidemia: Secondary | ICD-10-CM

## 2017-08-30 DIAGNOSIS — Z79899 Other long term (current) drug therapy: Secondary | ICD-10-CM | POA: Insufficient documentation

## 2017-08-30 DIAGNOSIS — J45909 Unspecified asthma, uncomplicated: Secondary | ICD-10-CM | POA: Insufficient documentation

## 2017-08-30 DIAGNOSIS — Y999 Unspecified external cause status: Secondary | ICD-10-CM | POA: Insufficient documentation

## 2017-08-30 DIAGNOSIS — M546 Pain in thoracic spine: Secondary | ICD-10-CM | POA: Diagnosis not present

## 2017-08-30 MED ORDER — METHOCARBAMOL 500 MG PO TABS: 500.0000 mg | ORAL_TABLET | Freq: Three times a day (TID) | ORAL | 0 refills | Status: DC | PRN

## 2017-08-30 MED ORDER — ATENOLOL 50 MG PO TABS
25.0000 mg | ORAL_TABLET | Freq: Every day | ORAL | 11 refills | Status: DC
Start: 2017-08-30 — End: 2017-09-02

## 2017-08-30 NOTE — Progress Notes (Signed)
Cardiology Office Note   Date:  08/30/2017   ID:  Amanda Wilkins, DOB Oct 13, 1946, MRN [id number]  PCP:  [name], MD  Cardiologist:   [name], MD    F/u of PAF   History of Present Illness: Amanda Wilkins is a 71 y.o. female with a history of CP and PAF  ON anticoagulatoin  Echo normal LVEF April 2048m  Myovuew with inferolateral nd infer defect  Consistent with ischemia  CT declined by insurance.   Mild plaaquing on carotid arteries   Near syncope in past   Holter in Jan showed atrial fib / SVT  Not always sensed  Flecainide 50 bid started by pt did not take    I saw the patient in Jan 2018  She was seen by Gerrianne Scale in [place]  Had increase fatigue and slow HR  Then racing   Since seen notes occasionally notes palpitations but not much  Breathing good  Exercises      Current Meds  Medication Sig  . acetaminophen (TYLENOL) 650 MG CR tablet Take 650 mg by mouth every 8 (eight) hours as needed for pain.  [name] atenolol (TENORMIN) 50 MG tablet Take 1 tablet (50 mg total) by mouth daily.  . Cholecalciferol (VITAMIN D) 2000 UNITS tablet Take 2,000 Units by mouth daily.  [name] estradiol (VIVELLE-DOT) 0.1 MG/24HR patch Place 1 patch (0.1 mg total) onto the skin 2 (two) times a week.  . hydroxychloroquine (PLAQUENIL) 200 MG tablet Take 200 mg by mouth daily.  . Multiple Vitamin (MULTIVITAMIN) capsule Take 1 capsule by mouth daily.  . progesterone (PROMETRIUM) 100 MG capsule Take one tablet by mouth day 1-12 every month  . rivaroxaban (XARELTO) 20 MG TABS tablet Take 1 tablet (20 mg total) by mouth daily with supper. Patient needs office visit before refills will be given  . rosuvastatin (CRESTOR) 5 MG tablet Take 1 tablet (5 mg total) by mouth daily. Please dispense generic.  [name] sertraline (ZOLOFT) 50 MG tablet Take 50 mg by mouth daily.     Allergies:   Epinephrine base; Aspirin; Penicillins; and Tape   Past Medical History:  Diagnosis Date  . Allergy   .  Arthritis    ON BOTH KNEES  . Atrial fibrillation ([place])   . Chronic interstitial nephritis   . Depression   . Hypertension   . Migraines    on Zoloft for migraines  . Obesity   . Palpitations   . Rib pain 02/26/2017   Related to back pain - r/o compr fx    Past Surgical History:  Procedure Laterality Date  . CESAREAN SECTION     x 1  . DENTAL SURGERY     multiple implants  . ESOPHAGOGASTRODUODENOSCOPY (EGD) N/A 02/08/2015   Performed by [name], MD at [place]  . repair of torn meniscus on left 08/2014 Left 08/2015   Dr. Ronnie Derby at [facility]  . SHOULDER SURGERY Right April 04 2016     Social History:  The patient  reports that  has never smoked. she has never used smokeless tobacco. She reports that she drinks alcohol. She reports that she does not use drugs.   Family History:  The patient's family history includes Lymphoma in her mother; Pancreatic cancer in her brother; Prostate cancer in her father.    ROS:  Please see the history of present illness. All other systems are reviewed and  Negative to the above problem except as noted.    PHYSICAL  EXAM: VS:  BP 128/74   Pulse 64   Ht 5\' 5"  (1.651 m)   Wt 217 lb 12.8 oz (98.8 kg)   BMI 36.24 kg/m   GEN: Well nourished, well developed, in no acute distress  HEENT: normal  Neck: no JVD, carotid bruits, or masses Cardiac: RRR; no murmurs, rubs, or gallops,no edema  Respiratory:  clear to auscultation bilaterally, normal work of breathing GI: soft, nontender, nondistended, + BS  No hepatomegaly  MS: no deformity Moving all extremities   Skin: warm and dry, no rash Neuro:  Strength and sensation are intact Psych: euthymic mood, full affect   EKG:  EKG is not  ordered today.   Lipid Panel    Component Value Date/Time   CHOL 161 01/31/2017 0807   TRIG 59.0 01/31/2017 0807   HDL 82.10 01/31/2017 0807   CHOLHDL 2 01/31/2017 0807   VLDL 11.8 01/31/2017 0807   LDLCALC 67 01/31/2017 0807      Wt Readings  from Last 3 Encounters:  08/30/17 217 lb 12.8 oz (98.8 kg)  06/06/17 219 lb (99.3 kg)  05/01/17 223 lb (101.2 kg)      ASSESSMENT AND PLAN:  1 PAF  Picked up on event monitor  Occasional skips  ON Xarelto  WIll check CBC  Continue low dose atenolol  2  HTN  BP is OK  3  CV dz  Has USN sched in Dec of neck  4  HL  LDL good last spring  Continue   F?U in 6 months        Current medicines are reviewed at length with the patient today.  The patient does not have concerns regarding medicines.  Signed, [name], MD  08/30/2017 10:46 AM    [address], [place], [place]  [address] Phone: [phone]; Fax: [phone]

## 2017-08-30 NOTE — Discharge Instructions (Signed)
Robaxin as needed for muscle pain or spasm.  Tylenol for simple pain.
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
## 2017-08-30 NOTE — Patient Instructions (Signed)
Your physician recommends that you continue on your current medications as directed. Please refer to the Current Medication list given to you today.  Your physician recommends that you return for lab work today (CBC)  Your physician wants you to follow-up in: 6 months with Dr. [name].  You will receive a reminder letter in the mail two months in advance. If you don't receive a letter, please call our office to schedule the follow-up appointment.

## 2017-08-31 LAB — CBC
HEMATOCRIT: 37.6 % (ref 34.0–46.6)
HEMOGLOBIN: 13.1 g/dL (ref 11.1–15.9)
MCH: 33.8 pg — ABNORMAL HIGH (ref 26.6–33.0)
MCHC: 34.8 g/dL (ref 31.5–35.7)
MCV: 97 fL (ref 79–97)
Platelets: 207 10*3/uL (ref 150–379)
RBC: 3.88 x10E6/uL (ref 3.77–5.28)
RDW: 13.2 % (ref 12.3–15.4)
WBC: 6.3 10*3/uL (ref 3.4–10.8)

## 2017-09-02 ENCOUNTER — Telehealth: Payer: Self-pay | Admitting: Internal Medicine

## 2017-09-02 ENCOUNTER — Encounter: Payer: Self-pay | Admitting: Cardiology

## 2017-09-02 ENCOUNTER — Ambulatory Visit (INDEPENDENT_AMBULATORY_CARE_PROVIDER_SITE_OTHER): Payer: Medicare Other | Admitting: Cardiology

## 2017-09-02 VITALS — BP 102/62 | HR 75 | Ht 65.0 in | Wt 217.4 lb

## 2017-09-02 DIAGNOSIS — I6523 Occlusion and stenosis of bilateral carotid arteries: Secondary | ICD-10-CM

## 2017-09-02 DIAGNOSIS — R002 Palpitations: Secondary | ICD-10-CM | POA: Diagnosis not present

## 2017-09-02 MED ORDER — ATENOLOL 50 MG PO TABS: 25.0000 mg | ORAL_TABLET | Freq: Two times a day (BID) | ORAL | 6 refills | Status: DC

## 2017-09-02 NOTE — Telephone Encounter (Signed)
New message ° ° °Patient calling for lab results. Please call °
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
## 2017-09-02 NOTE — Patient Instructions (Addendum)
Medication Instructions:   START TAKING  ATENOLOL TWICE A DAY  ( 25 MG IN THE AM AND 25 MG IN THE PM )  If you need a refill on your cardiac medications before your next appointment, please call your pharmacy.  Labwork: NONE ORDERED  TODAY    Testing/Procedures: NONE ORDERED  TODAY    Follow-Up: AS SCHEDULED WITH DR ROSS   Any Other Special Instructions Will Be Listed Below (If Applicable).

## 2017-09-02 NOTE — Telephone Encounter (Signed)
Patient made aware CBC is normal per Dr. [name].

## 2017-09-02 NOTE — Progress Notes (Signed)
09/02/2017 Amanda Wilkins   [birth year]  [id number]  Primary Physician Plotnikov, Georgina Quint, MD Primary Cardiologist: Dr. Tenny Craw   Reason for Visit/CC: Palpitations   HPI:  Amanda Wilkins is a 71 y.o. female followed by Dr. Tenny Craw who presents to clinic today as an add-on given complaints of palpitations. She was recently seen by Dr. Tenny Craw 08/30/17 and was doing well at that visit w/o any complaints.   Per chart review, she has a h/o CP and PAF. She is on anticoagulation w/ Xarelto. She had an echo April 2016 that showed normal LVEF. She also had a stress Myoview that showed inferolateral and inferior defect c/w ischemia. Coronary CTA was ordered but declined by her insurance. She also has carotid artery disease with previous dopplers showing mild plaque. She had near syncope in the past. She wore a Holter monitor this past Jan that showed afib and SVT. It was recommended she start Flecainide 50 mg BID, however pt did not start (scared of side effects). She is however on atenolol for rate control.   After her recent visit with Dr. Tenny Craw, she was in [place] [address] . She was evaluated in the ED and had normal w/u. Per ED provider note, "Reassuring x-rays. Reassuring exam. Patient is anticoagulated but does not have strike to the head. No headache. Normal exam. Reassured". She was released from the ED. Since then she has had recurrent palpitations.    She is currently asymptomatic. She reports full compliance with atenolol. She is still not interested in starting Flecainide. EKG today shows NSR. HR in the 70s. BP is soft but stable at 102/62. She denies exertional CP or dyspnea.     Current Meds  Medication Sig  . acetaminophen (TYLENOL) 650 MG CR tablet Take 650 mg by mouth every 8 (eight) hours as needed for pain.  [name] atenolol (TENORMIN) 50 MG tablet Take 0.5 tablets (25 mg total) daily by mouth.  . Cholecalciferol (VITAMIN D) 2000 UNITS tablet Take 2,000 Units by mouth daily.  [name]  estradiol (VIVELLE-DOT) 0.1 MG/24HR patch Place 1 patch (0.1 mg total) onto the skin 2 (two) times a week.  . hydroxychloroquine (PLAQUENIL) 200 MG tablet Take 200 mg by mouth daily.  . methocarbamol (ROBAXIN) 500 MG tablet Take 1 tablet (500 mg total) 3 (three) times daily between meals as needed by mouth.  . Multiple Vitamin (MULTIVITAMIN) capsule Take 1 capsule by mouth daily.  . progesterone (PROMETRIUM) 100 MG capsule Take one tablet by mouth day 1-12 every month  . rivaroxaban (XARELTO) 20 MG TABS tablet Take 1 tablet (20 mg total) by mouth daily with supper. Patient needs office visit before refills will be given  . rosuvastatin (CRESTOR) 5 MG tablet Take 1 tablet (5 mg total) by mouth daily. Please dispense generic.  [name] sertraline (ZOLOFT) 50 MG tablet Take 50 mg by mouth daily.   Allergies  Allergen Reactions  . Epinephrine Base Other (See Comments)    Seriously increases heart rate  . Aspirin Other (See Comments)    Can take the coated 325 mg. Plain asa 325 mg speeds up the heart.  . Penicillins Other (See Comments)    Pt previously has been told not to take because of family history of reactions (water blisters). She took amoxicillin in 2012-2013 with no reaction  . Tape Other (See Comments)    Blisters from adhesive tape   Past Medical History:  Diagnosis Date  . Allergy   . Arthritis  ON BOTH KNEES  . Atrial fibrillation (HCC)   . Chronic interstitial nephritis   . Depression   . Hypertension   . Migraines    on Zoloft for migraines  . Obesity   . Palpitations   . Rib pain 02/26/2017   Related to back pain - r/o compr fx   Family History  Problem Relation Age of Onset  . Pancreatic cancer Brother   . Lymphoma Mother   . Prostate cancer Father        metastatic prostate cancer   Past Surgical History:  Procedure Laterality Date  . CESAREAN SECTION     x 1  . DENTAL SURGERY     multiple implants  . ESOPHAGOGASTRODUODENOSCOPY (EGD) N/A 02/08/2015   Performed  by Louis Meckel, MD at [facility] ENDOSCOPY  . repair of torn meniscus on left 08/2014 Left 08/2015   Dr. Sherlean Foot at [facility]  . SHOULDER SURGERY Right April 04 2016   Social History   Socioeconomic History  . Marital status: Married    Spouse name: Not on file  . Number of children: 2  . Years of education: Not on file  . Highest education level: Not on file  Social Needs  . Financial resource strain: Not on file  . Food insecurity - worry: Not on file  . Food insecurity - inability: Not on file  . Transportation needs - medical: Not on file  . Transportation needs - non-medical: Not on file  Occupational History  . Occupation: retired    Associate Professor: UNEMPLOYED  . Occupation: Part-time (with temp agency)  Tobacco Use  . Smoking status: Never Smoker  . Smokeless tobacco: Never Used  . Tobacco comment: H/O social smoking at times when drinking--never a "habit"  Substance and Sexual Activity  . Alcohol use: Yes    Alcohol/week: 0.0 oz    Comment: Occas  . Drug use: No  . Sexual activity: Yes    Comment: intercourse age 13, sexual partners more than 5  Other Topics Concern  . Not on file  Social History Narrative  . Not on file     Review of Systems: General: negative for chills, fever, night sweats or weight changes.  Cardiovascular: negative for chest pain, dyspnea on exertion, edema, orthopnea, palpitations, paroxysmal nocturnal dyspnea or shortness of breath Dermatological: negative for rash Respiratory: negative for cough or wheezing Urologic: negative for hematuria Abdominal: negative for nausea, vomiting, diarrhea, bright red blood per rectum, melena, or hematemesis Neurologic: negative for visual changes, syncope, or dizziness All other systems reviewed and are otherwise negative except as noted above.   Physical Exam:  Blood pressure 102/62, pulse 75, height 5\' 5"  (1.651 m), weight 217 lb 6.4 oz (98.6 kg), SpO2 98 %.  General appearance: alert, cooperative and no  distress Neck: no carotid bruit and no JVD Lungs: clear to auscultation bilaterally Heart: regular rate and rhythm, S1, S2 normal, no murmur, click, rub or gallop Extremities: extremities normal, atraumatic, no cyanosis or edema Pulses: 2+ and symmetric Skin: Skin color, texture, turgor normal. No rashes or lesions Neurologic: Grossly normal  EKG NSR 75 bpm -- personally reviewed   ASSESSMENT AND PLAN:   1. Palpitations: h/o PAF and SVT, confirmed by monitor 10/2016. Pt is on Xarelto for a/c. Flecainide previously recommended but pt refuses to take given potential side effects. She is on low dose atenolol 25 mg once daily. EKG today shows NSR. HR is 75 bpm. Pt has been unable to tolerate higher dose  of atenolol due to soft BP. Given limited treatment options as pt opposes AADs, I've recommended she try taking atenolol BID for added rate control but divided doses as to not drop BP too much. Try 25 mg BID. She is open to this idea and will monitor BP and HR closely. She will call if no improvement in symptoms. Otherwise keep planned f/u with Dr. Tenny Craw in 6 months. I also offered to check blood work including K, Mg and TSH, however pt declined stating PCP and weight management program recently checked blood and everything was "ok".    Philipp Callegari Delmer Islam, MHS [facility] 09/02/2017 2:40 PM
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
## 2017-09-11 ENCOUNTER — Ambulatory Visit: Payer: Self-pay
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
## 2017-09-12 ENCOUNTER — Ambulatory Visit (INDEPENDENT_AMBULATORY_CARE_PROVIDER_SITE_OTHER): Payer: Medicare Other | Admitting: Internal Medicine

## 2017-09-12 ENCOUNTER — Other Ambulatory Visit (INDEPENDENT_AMBULATORY_CARE_PROVIDER_SITE_OTHER): Payer: Medicare Other

## 2017-09-12 ENCOUNTER — Encounter: Payer: Self-pay | Admitting: Internal Medicine

## 2017-09-12 ENCOUNTER — Telehealth: Payer: Self-pay | Admitting: Internal Medicine

## 2017-09-12 DIAGNOSIS — I6523 Occlusion and stenosis of bilateral carotid arteries: Secondary | ICD-10-CM

## 2017-09-12 DIAGNOSIS — S161XXS Strain of muscle, fascia and tendon at neck level, sequela: Secondary | ICD-10-CM | POA: Insufficient documentation

## 2017-09-12 DIAGNOSIS — R4586 Emotional lability: Secondary | ICD-10-CM

## 2017-09-12 LAB — BASIC METABOLIC PANEL
BUN: 20 mg/dL (ref 6–23)
CO2: 26 mEq/L (ref 19–32)
Calcium: 9.7 mg/dL (ref 8.4–10.5)
Chloride: 100 mEq/L (ref 96–112)
Creatinine, Ser: 0.86 mg/dL (ref 0.40–1.20)
GFR: 69.18 mL/min (ref >60.00)
Glucose, Bld: 100 mg/dL — ABNORMAL HIGH (ref 70–99)
POTASSIUM: 4.8 meq/L (ref 3.5–5.1)
SODIUM: 132 meq/L — AB (ref 135–145)

## 2017-09-12 LAB — CBC WITH DIFFERENTIAL/PLATELET
Basophils Absolute: 0 10*3/uL (ref 0.0–0.1)
Basophils Relative: 0.7 % (ref 0.0–3.0)
EOS PCT: 2.7 % (ref 0.0–5.0)
Eosinophils Absolute: 0.2 10*3/uL (ref 0.0–0.7)
HCT: 39.8 % (ref 36.0–46.0)
HEMOGLOBIN: 13.3 g/dL (ref 12.0–15.0)
Lymphocytes Relative: 31 % (ref 12.0–46.0)
Lymphs Abs: 1.9 10*3/uL (ref 0.7–4.0)
MCHC: 33.3 g/dL (ref 30.0–36.0)
MCV: 100 fl (ref 78.0–100.0)
MONOS PCT: 6.4 % (ref 3.0–12.0)
Monocytes Absolute: 0.4 10*3/uL (ref 0.1–1.0)
Neutro Abs: 3.6 10*3/uL (ref 1.4–7.7)
Neutrophils Relative %: 59.2 % (ref 43.0–77.0)
Platelets: 175 10*3/uL (ref 150.0–400.0)
RBC: 3.98 Mil/uL (ref 3.87–5.11)
RDW: 13.3 % (ref 11.5–15.5)
WBC: 6 10*3/uL (ref 4.0–10.5)

## 2017-09-12 LAB — TSH: TSH: 3.12 u[IU]/mL (ref 0.35–4.50)

## 2017-09-12 MED ORDER — ALPRAZOLAM 0.25 MG PO TABS: 0.2500 mg | ORAL_TABLET | Freq: Two times a day (BID) | ORAL | 2 refills | Status: DC | PRN

## 2017-09-12 MED ORDER — B COMPLEX PO TABS: 1.0000 | ORAL_TABLET | Freq: Every day | ORAL | 3 refills | Status: DC

## 2017-09-12 MED ORDER — RIVAROXABAN 20 MG PO TABS: 20.0000 mg | ORAL_TABLET | Freq: Every day | ORAL | 3 refills | Status: DC
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
## 2017-09-12 NOTE — Telephone Encounter (Signed)
Amanda Wilkins, Please inform the patient that all labs are ok. Pls ask if she is better. Pls inform the pt that I thought her case over and decided too get a brain MRI (ordered) to be on the safe side. Thanks, AP
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
## 2017-09-13 NOTE — Telephone Encounter (Signed)
LM notifying pt

## 2017-09-16 ENCOUNTER — Encounter: Payer: Self-pay | Admitting: Internal Medicine

## 2017-09-16 ENCOUNTER — Ambulatory Visit: Payer: Medicare Other | Admitting: Internal Medicine

## 2017-09-16 DIAGNOSIS — R4586 Emotional lability: Secondary | ICD-10-CM
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
## 2017-09-16 NOTE — Assessment & Plan Note (Signed)
Xanax prn  Zoloft - 1.5 tab a day if tol See Dr Sharla Kidney MRI brain

## 2017-09-16 NOTE — Assessment & Plan Note (Signed)
R shoulder pain Emotional distress

## 2017-09-16 NOTE — Patient Instructions (Signed)
Zoloft - try 1 and 1/2 tablet a day

## 2017-09-25 ENCOUNTER — Encounter: Payer: Self-pay | Admitting: Internal Medicine

## 2017-10-09 ENCOUNTER — Other Ambulatory Visit: Payer: Self-pay

## 2017-10-17 ENCOUNTER — Ambulatory Visit: Payer: BLUE CROSS/BLUE SHIELD | Admitting: Psychology

## 2017-10-28 DIAGNOSIS — N950 Postmenopausal bleeding: Secondary | ICD-10-CM | POA: Diagnosis not present

## 2017-11-15 ENCOUNTER — Other Ambulatory Visit: Payer: Self-pay | Admitting: Internal Medicine

## 2017-11-15 ENCOUNTER — Other Ambulatory Visit: Payer: Self-pay

## 2017-11-15 DIAGNOSIS — G43109 Migraine with aura, not intractable, without status migrainosus: Secondary | ICD-10-CM

## 2017-11-15 MED ORDER — ROSUVASTATIN CALCIUM 5 MG PO TABS: 5.0000 mg | ORAL_TABLET | Freq: Every day | ORAL | 1 refills | Status: DC

## 2017-11-21 DIAGNOSIS — M170 Bilateral primary osteoarthritis of knee: Secondary | ICD-10-CM | POA: Diagnosis not present

## 2017-11-21 DIAGNOSIS — E669 Obesity, unspecified: Secondary | ICD-10-CM | POA: Diagnosis not present

## 2017-11-21 DIAGNOSIS — M2550 Pain in unspecified joint: Secondary | ICD-10-CM | POA: Diagnosis not present

## 2017-11-21 DIAGNOSIS — Z79899 Other long term (current) drug therapy: Secondary | ICD-10-CM | POA: Diagnosis not present

## 2017-11-21 DIAGNOSIS — M0609 Rheumatoid arthritis without rheumatoid factor, multiple sites: Secondary | ICD-10-CM | POA: Diagnosis not present

## 2017-11-21 DIAGNOSIS — Z6836 Body mass index (BMI) 36.0-36.9, adult: Secondary | ICD-10-CM | POA: Diagnosis not present

## 2017-12-10 ENCOUNTER — Encounter (HOSPITAL_COMMUNITY): Payer: Self-pay | Admitting: Emergency Medicine

## 2017-12-10 ENCOUNTER — Other Ambulatory Visit: Payer: Self-pay

## 2017-12-10 ENCOUNTER — Emergency Department (HOSPITAL_COMMUNITY)
Admission: EM | Admit: 2017-12-10 | Discharge: 2017-12-11 | Disposition: A | Discharge status: Home / Self Care | Payer: Medicare Other | Attending: Emergency Medicine | Admitting: Emergency Medicine

## 2017-12-10 DIAGNOSIS — Z79899 Other long term (current) drug therapy: Secondary | ICD-10-CM | POA: Insufficient documentation

## 2017-12-10 DIAGNOSIS — I480 Paroxysmal atrial fibrillation: Secondary | ICD-10-CM | POA: Insufficient documentation

## 2017-12-10 DIAGNOSIS — J45909 Unspecified asthma, uncomplicated: Secondary | ICD-10-CM | POA: Diagnosis not present

## 2017-12-10 DIAGNOSIS — R002 Palpitations: Secondary | ICD-10-CM | POA: Diagnosis not present

## 2017-12-10 DIAGNOSIS — I10 Essential (primary) hypertension: Secondary | ICD-10-CM | POA: Insufficient documentation

## 2017-12-10 DIAGNOSIS — I4892 Unspecified atrial flutter: Secondary | ICD-10-CM | POA: Diagnosis not present

## 2017-12-10 NOTE — ED Triage Notes (Signed)
Pt reports onset of chest palpitations earlier this evening. Pt states it feels like her heart is racing. Pt states she checked her BP and it was elevated (167/117) Denies CP, SOB, N/V. Per Dr. Betsey Holiday pt to come back next

## 2017-12-11 ENCOUNTER — Encounter: Payer: Self-pay | Admitting: Physician Assistant

## 2017-12-11 ENCOUNTER — Telehealth: Payer: Self-pay | Admitting: Internal Medicine

## 2017-12-11 ENCOUNTER — Ambulatory Visit (INDEPENDENT_AMBULATORY_CARE_PROVIDER_SITE_OTHER): Payer: Medicare Other | Admitting: Physician Assistant

## 2017-12-11 ENCOUNTER — Telehealth: Payer: Self-pay | Admitting: *Deleted

## 2017-12-11 VITALS — BP 126/84 | HR 71 | Ht 65.0 in | Wt 219.0 lb

## 2017-12-11 DIAGNOSIS — I480 Paroxysmal atrial fibrillation: Secondary | ICD-10-CM | POA: Diagnosis not present

## 2017-12-11 DIAGNOSIS — E871 Hypo-osmolality and hyponatremia: Secondary | ICD-10-CM | POA: Diagnosis not present

## 2017-12-11 DIAGNOSIS — Z9289 Personal history of other medical treatment: Secondary | ICD-10-CM

## 2017-12-11 DIAGNOSIS — I471 Supraventricular tachycardia: Secondary | ICD-10-CM | POA: Diagnosis not present

## 2017-12-11 DIAGNOSIS — I4892 Unspecified atrial flutter: Secondary | ICD-10-CM | POA: Diagnosis not present

## 2017-12-11 LAB — CBC
HCT: 40.2 % (ref 36.0–46.0)
Hemoglobin: 13.8 g/dL (ref 12.0–15.0)
MCH: 33.5 pg (ref 26.0–34.0)
MCHC: 34.3 g/dL (ref 30.0–36.0)
MCV: 97.6 fL (ref 78.0–100.0)
PLATELETS: 191 10*3/uL (ref 150–400)
RBC: 4.12 MIL/uL (ref 3.87–5.11)
RDW: 13.2 % (ref 11.5–15.5)
WBC: 8.2 10*3/uL (ref 4.0–10.5)

## 2017-12-11 LAB — BASIC METABOLIC PANEL
Anion gap: 11 (ref 5–15)
BUN: 18 mg/dL (ref 6–20)
CHLORIDE: 101 mmol/L (ref 101–111)
CO2: 21 mmol/L — AB (ref 22–32)
CREATININE: 0.77 mg/dL (ref 0.44–1.00)
Calcium: 9.7 mg/dL (ref 8.9–10.3)
GFR calc Af Amer: >60 mL/min (ref >60)
GFR calc non Af Amer: >60 mL/min (ref >60)
Glucose, Bld: 111 mg/dL — ABNORMAL HIGH (ref 65–99)
POTASSIUM: 4.5 mmol/L (ref 3.5–5.1)
Sodium: 133 mmol/L — ABNORMAL LOW (ref 135–145)

## 2017-12-11 LAB — I-STAT TROPONIN, ED: Troponin i, poc: 0 ng/mL (ref 0.00–0.08)

## 2017-12-11 MED ORDER — ATENOLOL 50 MG PO TABS: 50.0000 mg | ORAL_TABLET | Freq: Every day | ORAL | 3 refills | Status: DC

## 2017-12-11 NOTE — Telephone Encounter (Signed)
Spoke with Melina Copa, PA-C and she said ok to add pt to schedule today at 3pm.  HR today has been 71 today and doesn't feel like she's back out of rhythm.  Pt states she just doesn't feel right.  Scheduled her to see Dayna today.  Pt appreciative for call.

## 2017-12-11 NOTE — Telephone Encounter (Signed)
Amanda Wilkins is calling because she went to the ER on Last Night because of Rapid Heart Beat and she was in AFIB ,but she does not feel very well . Asking to come in to see someone today . Please call

## 2017-12-11 NOTE — Patient Instructions (Addendum)
Medication Instructions:  Your physician has recommended you make the following change in your medication:  1-Increase Atenolol 50 mg by mouth daily  Labwork: NONE  Testing/Procedures: Your physician has recommended that you have a sleep study. This test records several body functions during sleep, including: brain activity, eye movement, oxygen and carbon dioxide blood levels, heart rate and rhythm, breathing rate and rhythm, the flow of air through your mouth and nose, snoring, body muscle movements, and chest and belly movement.  Follow-Up: Your physician wants you to follow-up in: 6 weeks with Melina Copa PA.  If you need a refill on your cardiac medications before your next appointment, please call your pharmacy.

## 2017-12-11 NOTE — ED Notes (Signed)
ED Provider at bedside. 
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
## 2017-12-11 NOTE — Telephone Encounter (Signed)
-----   Message from Michaelyn Barter, RN sent at 12/11/2017  4:15 PM EST ----- Patient has a sleep study ordered.

## 2017-12-11 NOTE — Telephone Encounter (Signed)
Sent to sleep pool. 

## 2017-12-11 NOTE — ED Notes (Signed)
Pt verbalizes understanding of d/c instructions. Pt ambulatory at d/c with all belongings.   

## 2017-12-11 NOTE — Progress Notes (Signed)
Cardiology Office Note    Date:  12/11/2017  ID:  Amanda Wilkins, DOB 1945/12/19, MRN [id number] PCP:  [name], MD  Cardiologist:  Dr. [name]   Chief Complaint: not feeling well  History of Present Illness:  Amanda Wilkins is a 72 y.o. female with history of paroxysmal atrial fib, PSVT, abnormal stress test in 2016, hyponatremia, depression, chronic interstitial nephritis, obesity who presents for f/u ER visit for atrial flutter. To recap history she has had known PAF for many years. In 2016 she had an abnormal stress test. Dr. [name] recommended cardiac CT but this was declined by insurance. Before further testing could be discussed, she was admitted to the hospital 01/2015 with recurrent persistent afib in the setting of sepsis, erysipelas, acute renal failure and heme positive stool with unremarkable EGD at that time. She was on Coumadin. Echo had shown EF 55-60%, no RWMA, mild MR, mild LAE, PASP 32mmHg. F/u Holter shortly after that admission showed PAT but no recurrent afib so diltiazem was stopped. Dr. [name] mentioned that she had previously reviewed a CT angio that, although not specifically a dedicated study for coronary disease, did not mention significant coronary calcification in the report. There was mild atherosclerosis in the aorta. Dr. [name] was not convinced of active ischemia/significant CAD so recommended continued surveillance of symptoms. In 10/2016 the patient wore another event monitor showing PAF and SVT. Flecainide was suggested but the patient declined due to side effect risk profile. She was last seen by Lyda Jester PA-C for breakthrough palpitations at which time it was advised she titrate atenolol to 25mg  BID, although she states today she had not yet done this - she was most recently alternating 25mg  daily with 50mg  daily.  She was seen in the ER yesterday with palpitations. She was watching a movie with her husband, drank some cold water and instantly  began feeling her heart racing. She took an extra 50mg  of atenolol but symptoms persisted so she proceeded to the ER. She was noted to be in new onset atrial flutter by triage EKG. By the time she was further evaluated she was back in NSR spontaneously. Labs in the ED showed Hgb 13.8, sodium 133 (chronic to 04/2016), CO2 21, glucose 111, BUN 18, Cr 0.77. TSH in 08/2017 was normal. LDL in 01/2017 was 67. She returns for follow-up today to review future plans. She currently denies any palpitations. She's not had any recent chest pain or dyspnea. She exercises 2 days a week on the NuStep and strength training without any functional limitation. She has never had a sleep study. She currently feels a little worn out like she has in the past after an episode of afib, but has no acute complaints otherwise. Reports compliance with anticoagulation.    Past Medical History:  Diagnosis Date  . Abnormal stress test    a. 2016 -> abnormal nuc. Dr. [name] recommended cardiac CT but insurance declined. Due to lack of convincing anginal sx, Dr. [name] recommended to continue to observe.  . Allergy   . Arthritis    ON BOTH KNEES  . Chronic interstitial nephritis   . Depression   . Hypertension   . Hyponatremia   . Migraines    on Zoloft for migraines  . Obesity   . PAF (paroxysmal atrial fibrillation) ([place])    a. remotely diagnosed. Was on Coumadin, now on Xarelto.  . Palpitations   . Paroxysmal atrial flutter ([place])    a. dx 11/2017.  [name] PSVT (  paroxysmal supraventricular tachycardia) ([place])   . Rib pain 02/26/2017   Related to back pain - r/o compr fx    Past Surgical History:  Procedure Laterality Date  . CESAREAN SECTION     x 1  . DENTAL SURGERY     multiple implants  . ESOPHAGOGASTRODUODENOSCOPY N/A 02/08/2015   Procedure: ESOPHAGOGASTRODUODENOSCOPY (EGD);  Surgeon: [name], MD;  Location: [place];  Service: Endoscopy;  Laterality: N/A;  . repair of torn meniscus on left 08/2014 Left 08/2015     [facility] Ron[facility] at [facility]  . SHOULDER SURGERY Right April 04 2016    Current Medications: Current Meds  Medication Sig  . acetaminophen (TYLENOL) 650 MG CR tablet Take 650 mg by mouth every 8 (eight) hours as needed for pain.  [name] atenolol (TENORMIN) 50 MG tablet Take 0.5 tablets (25 mg total) 2 (two) times daily by mouth.  [name] b complex vitamins tablet Take 1 tablet by mouth daily.  . Cholecalciferol (VITAMIN D) 2000 UNITS tablet Take 2,000 Units by mouth daily.  [name] estradiol (VIVELLE-DOT) 0.1 MG/24HR patch Place 1 patch (0.1 mg total) onto the skin 2 (two) times a week.  . hydroxychloroquine (PLAQUENIL) 200 MG tablet Take 200 mg by mouth daily.  . Multiple Vitamin (MULTIVITAMIN) capsule Take 1 capsule by mouth daily.  . progesterone (PROMETRIUM) 100 MG capsule Take one tablet by mouth day 1-12 every month  . rivaroxaban (XARELTO) 20 MG TABS tablet Take 1 tablet (20 mg total) by mouth daily with supper.  . rosuvastatin (CRESTOR) 5 MG tablet Take 1 tablet (5 mg total) by mouth daily. Please dispense generic.  [name] sertraline (ZOLOFT) 50 MG tablet Take 50 mg by mouth daily.    Allergies:   Epinephrine base; Aspirin; Penicillins; and Tape   Social History   Socioeconomic History  . Marital status: Married    Spouse name: None  . Number of children: 2  . Years of education: None  . Highest education level: None  Social Needs  . Financial resource strain: None  . Food insecurity - worry: None  . Food insecurity - inability: None  . Transportation needs - medical: None  . Transportation needs - non-medical: None  Occupational History  . Occupation: retired    Fish farm manager: UNEMPLOYED  . Occupation: Part-time (with temp agency)  Tobacco Use  . Smoking status: Never Smoker  . Smokeless tobacco: Never Used  . Tobacco comment: H/O social smoking at times when drinking--never a "habit"  Substance and Sexual Activity  . Alcohol use: Yes    Alcohol/week: 0.0 oz    Comment: Occas  . Drug use: No   . Sexual activity: Yes    Comment: intercourse age 54, sexual partners more than 5  Other Topics Concern  . None  Social History Narrative  . None     Family History:  Family History  Problem Relation Age of Onset  . Pancreatic cancer Brother   . Lymphoma Mother   . Prostate cancer Father        metastatic prostate cancer    ROS:   Please see the history of present illness. Mild rush in her head noted upon standing (see below). No bleeding. Mild snoring but not terribly so. Was previously told she needed sleep study but was asked to lose weight and re-evaluate - this was many years ago. All other systems are reviewed and otherwise negative.    PHYSICAL EXAM:   VS:  BP 126/84   Pulse 71  Ht 5\' 5"  (1.651 m)   Wt 219 lb (99.3 kg)   SpO2 96%   BMI 36.44 kg/m   BMI: Body mass index is 36.44 kg/m. GEN: Well nourished, well developed obese WF in no acute distress  HEENT: normocephalic, atraumatic Neck: no JVD, carotid bruits, or masses Cardiac: RRR; no murmurs, rubs, or gallops, no edema  Respiratory:  clear to auscultation bilaterally, normal work of breathing GI: soft, nontender, nondistended, + BS MS: no deformity or atrophy  Skin: warm and dry, no rash Neuro:  Alert and Oriented x 3, Strength and sensation are intact, follows commands Psych: euthymic mood, full affect  Wt Readings from Last 3 Encounters:  12/11/17 219 lb (99.3 kg)  12/10/17 215 lb (97.5 kg)  09/16/17 218 lb (98.9 kg)      Studies/Labs Reviewed:   EKG:  EKG was ordered today and personally reviewed by me and demonstrates NSR 66bpm, no acute ST-T changes, QTC 489ms.  Recent Labs: 01/31/2017: ALT 17 09/12/2017: TSH 3.12 12/10/2017: BUN 18; Creatinine, Ser 0.77; Hemoglobin 13.8; Platelets 191; Potassium 4.5; Sodium 133   Lipid Panel    Component Value Date/Time   CHOL 161 01/31/2017 0807   TRIG 59.0 01/31/2017 0807   HDL 82.10 01/31/2017 0807   CHOLHDL 2 01/31/2017 0807   VLDL 11.8  01/31/2017 0807   LDLCALC 67 01/31/2017 0807    Additional studies/ records that were reviewed today include: Summarized above.   ASSESSMENT & PLAN:   1. Paroxysmal atrial flutter - newly recognized in the ER (superimposed on hx of PAF), spontaneously converted. We had discussion about increasing rate controlling agents versus antiarrhythmics. She does have some history of mild orthostasis type "rush" to her head upon standing but this has not been lifestyle limiting. She also notices it doesn't happen if she leans forward for a few minutes prior to standing. She wants to try increasing her atenolol to 50mg  daily as previously discussed (she prefers to take it all in one dose). Adding diltiazem back may be an option - this was stopped in 2016 after event monitor had only shown sinus with PAT. She doesn't recall having any specific problem with this medicine. Flecainide has previously been suggested but given her history of abnormal stress test that has never been fully elucidated, I think this is less ideal. I told her to monitor symptoms and call if there is recurrence in which I would suggest formal EP consultation. I also have recommended sleep study to r/o sleep apnea. 2. Paroxysmal atrial fibrillation - dates back to 1997. Plan as above - sleep study and increase in BB. The actual last truly persistent episode she recalls was back in 2017, although it sounds like she has short runs from time to time. 3. PSVT - increase beta blocker as above. 4. Hyponatremia - not clear to me what this is from. She denies prior workup. She is not on a diuretic. I have recommended she discuss further workup with her primary care provider. 5. Abnormal stress test - Dr. [name] has not previously pursued further eval as above. She is able to exercise with zero anginal symptoms whatsoever. Further testing can be considered if antiarrhythmic therapy is needed or if she develops shortness of breath or chest  pain.  Disposition: F/u with myself in 6 weeks (however, if she has breakthrough symptoms between now and then, would recommend EP eval instead)   Medication Adjustments/Labs and Tests Ordered: Current medicines are reviewed at length with the patient today.  Concerns regarding medicines are outlined above. Medication changes, Labs and Tests ordered today are summarized above and listed in the Patient Instructions accessible in Encounters.   Signed, Charlie Pitter, PA-C  12/11/2017 3:35 PM    [facility] [place], [place], [place]  [address] Phone: [phone]; Fax: [phone]

## 2017-12-13 ENCOUNTER — Telehealth: Payer: Self-pay | Admitting: Internal Medicine
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
## 2017-12-14 NOTE — Telephone Encounter (Signed)
pls sch OV w/me or Dr Jaquita Rector

## 2017-12-16 NOTE — Telephone Encounter (Signed)
LVM for patient to call back and sch appointment

## 2017-12-16 NOTE — Telephone Encounter (Signed)
Dr. Tamala Julian will see the patient however the earliest the pt can be seen is next week.

## 2017-12-16 NOTE — Telephone Encounter (Signed)
Can smith see patient

## 2017-12-17 NOTE — Telephone Encounter (Signed)
Informed patient of upcoming sleep study and patient understanding was verbalized. Patient understands her sleep study is scheduled for Monday December 30 2017. Patient understands her sleep study will be done at [facility] sleep lab. Patient understands she will receive a sleep packet in a week or so. Patient understands to call if she does not receive the sleep packet in a timely manner. Patient agrees with treatment and thanked me for call.

## 2017-12-17 NOTE — Telephone Encounter (Signed)
RE: Pre cert  Amanda Wilkins, CMA        Pt can be scheduled. Let me know and I will enter the precert info. Thanks, Amy

## 2017-12-23 ENCOUNTER — Ambulatory Visit (INDEPENDENT_AMBULATORY_CARE_PROVIDER_SITE_OTHER)
Admission: RE | Admit: 2017-12-23 | Discharge: 2017-12-23 | Disposition: A | Discharge status: Home / Self Care | Payer: Medicare Other | Source: Ambulatory Visit | Attending: Family Medicine | Admitting: Family Medicine

## 2017-12-23 ENCOUNTER — Ambulatory Visit (INDEPENDENT_AMBULATORY_CARE_PROVIDER_SITE_OTHER): Payer: Medicare Other | Admitting: Family Medicine

## 2017-12-23 ENCOUNTER — Encounter: Payer: Self-pay | Admitting: Family Medicine

## 2017-12-23 VITALS — BP 130/74 | HR 65 | Ht 65.0 in | Wt 219.0 lb

## 2017-12-23 DIAGNOSIS — M47812 Spondylosis without myelopathy or radiculopathy, cervical region: Secondary | ICD-10-CM | POA: Diagnosis not present

## 2017-12-23 DIAGNOSIS — M542 Cervicalgia: Secondary | ICD-10-CM

## 2017-12-23 DIAGNOSIS — S161XXS Strain of muscle, fascia and tendon at neck level, sequela: Secondary | ICD-10-CM

## 2017-12-23 DIAGNOSIS — M25511 Pain in right shoulder: Secondary | ICD-10-CM | POA: Diagnosis not present
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
## 2017-12-23 NOTE — Patient Instructions (Signed)
Great to meet you  xrays downstairs Ice 20 minutes 2 times daily. Usually after activity and before bed. Keep hands within peripheral vision and drop weight to 50-60% Then increase 5-10% a week Ice 20 minutes 2 times daily. Usually after activity and before bed. pennsaid pinkie amount topically 2 times daily as needed.  Exercises 3 times a week.  Over the counter get  Vitamin D 2000 IU daily  Turmeric 500mg  daily but watch for bruising Tart cherry extract any dose in pill form but at night Can continue PT as well  See me again in 3 weeks

## 2017-12-30 ENCOUNTER — Ambulatory Visit (HOSPITAL_BASED_OUTPATIENT_CLINIC_OR_DEPARTMENT_OTHER): Payer: Medicare Other | Attending: Physician Assistant | Admitting: Cardiology

## 2017-12-30 VITALS — Ht 65.0 in | Wt 217.0 lb

## 2017-12-30 DIAGNOSIS — I491 Atrial premature depolarization: Secondary | ICD-10-CM | POA: Insufficient documentation

## 2017-12-30 DIAGNOSIS — I4892 Unspecified atrial flutter: Secondary | ICD-10-CM

## 2017-12-30 DIAGNOSIS — I480 Paroxysmal atrial fibrillation: Secondary | ICD-10-CM

## 2017-12-30 DIAGNOSIS — E871 Hypo-osmolality and hyponatremia: Secondary | ICD-10-CM

## 2017-12-30 DIAGNOSIS — I493 Ventricular premature depolarization: Secondary | ICD-10-CM | POA: Insufficient documentation

## 2017-12-30 DIAGNOSIS — I471 Supraventricular tachycardia, unspecified: Secondary | ICD-10-CM

## 2017-12-30 DIAGNOSIS — I10 Essential (primary) hypertension: Secondary | ICD-10-CM | POA: Diagnosis not present

## 2017-12-30 DIAGNOSIS — G4733 Obstructive sleep apnea (adult) (pediatric): Secondary | ICD-10-CM | POA: Diagnosis not present

## 2017-12-31 ENCOUNTER — Telehealth: Payer: Self-pay | Admitting: *Deleted

## 2017-12-31 ENCOUNTER — Telehealth: Payer: Self-pay | Admitting: Physician Assistant

## 2017-12-31 DIAGNOSIS — G4733 Obstructive sleep apnea (adult) (pediatric): Secondary | ICD-10-CM

## 2017-12-31 NOTE — Telephone Encounter (Signed)
Please inform patient of result, + sleep apnea with recommendation for CPAP titration. This may be contributing to her arrhythmias. Additional recommendation per Dr. Radford Pax: - Positional therapy avoiding supine position (laying on back) during sleep. - Avoid alcohol, sedatives and other CNS depressants that may worsen sleep apnea and disrupt normal sleep architecture. - Sleep hygiene should be reviewed to assess factors that may improve sleep quality. - Weight management and regular exercise should be initiated or continued if appropriate.  Dayna Dunn PA-C

## 2017-12-31 NOTE — Telephone Encounter (Signed)
-----   Message from Sueanne Margarita, MD sent at 12/31/2017 11:07 AM EDT ----- Please let patient know that they have sleep apnea and recommend CPAP titration. Please set up titration in the sleep lab.

## 2017-12-31 NOTE — Telephone Encounter (Signed)
Called sleep result LMTCB.

## 2017-12-31 NOTE — Telephone Encounter (Signed)
Informed patient of sleep study results and patient understanding was verbalized. Patient understands she has sleep apnea and  Dr Radford Pax recommends a  CPAP titration. Patient understands her titration study will be done at [facility] sleep lab. Patient understands she will receive a sleep packet in a week or so. Patient understands to call if she does not receive the sleep packet in a timely manner. Patient agrees with treatment and thanked me for call.

## 2017-12-31 NOTE — Procedures (Signed)
NAME: Amanda Wilkins DATE OF BIRTH:  May 27, 1946 MEDICAL RECORD NUMBER [id number]  LOCATION: [facility]  PHYSICIAN: Melek Pownall  DATE OF STUDY: 12/30/2017  SLEEP STUDY TYPE: Nocturnal Polysomnogram               REFERRING PHYSICIAN: Laurann Montana, PA-C  Gender: Female D.O.B: 02-26-1946 Age (years): 17 Referring Provider: Ronie Spies Height (inches): 65 Interpreting Physician: Armanda Magic MD, ABSM Weight (lbs): 217 RPSGT: Lowry Ram BMI: 36 MRN: [id number] Neck Size: 14.00  CLINICAL INFORMATION Sleep Study Type: NPSG  Indication for sleep study: Hypertension, OSA, Snoring  Epworth Sleepiness Score: 2  SLEEP STUDY TECHNIQUE As per the AASM Manual for the Scoring of Sleep and Associated Events v2.3 (April 2016) with a hypopnea requiring 4% desaturations.  The channels recorded and monitored were frontal, central and occipital EEG, electrooculogram (EOG), submentalis EMG (chin), nasal and oral airflow, thoracic and abdominal wall motion, anterior tibialis EMG, snore microphone, electrocardiogram, and pulse oximetry.  MEDICATIONS Medications self-administered by patient taken the night of the study : N/A  SLEEP ARCHITECTURE The study was initiated at 10:48:11 PM and ended at 5:21:05 AM.  Sleep onset time was 76.4 minutes and the sleep efficiency was 59.3%%. The total sleep time was 233.0 minutes.  Stage REM latency was 239.0 minutes.  The patient spent 10.3%% of the night in stage N1 sleep, 85.6%% in stage N2 sleep, 0.0%% in stage N3 and 4.08% in REM.  Alpha intrusion was absent.  Supine sleep was 64.77%.  RESPIRATORY PARAMETERS The overall apnea/hypopnea index (AHI) was 23.4 per hour. There were 42 total apneas, including 39 obstructive, 1 central and 2 mixed apneas. There were 49 hypopneas and 25 RERAs.  The AHI during Stage REM sleep was 44.2 per hour.  AHI while supine was 35.0 per hour.  The mean oxygen saturation was 94.1%.  The minimum SpO2 during sleep was 84.0%.  loud snoring was noted during this study.  CARDIAC DATA The 2 lead EKG demonstrated sinus rhythm. The mean heart rate was 61.2 beats per minute. Other EKG findings include: PVCs and PACs.  LEG MOVEMENT DATA The total PLMS were 0 with a resulting PLMS index of 0.0. Associated arousal with leg movement index was 3.1 .  IMPRESSIONS - Moderate obstructive sleep apnea occurred during this study (AHI = 23.4/h). - No significant central sleep apnea occurred during this study (CAI = 0.3/h). - Mild oxygen desaturation was noted during this study (Min O2 = 84.0%). - The patient snored with loud snoring volume. - EKG findings include PVCs and PACs. - Clinically significant periodic limb movements did not occur during sleep. No significant associated arousals.  DIAGNOSIS - Obstructive Sleep Apnea (327.23 [G47.33 ICD-10])  RECOMMENDATIONS - Therapeutic CPAP titration to determine optimal pressure required to alleviate sleep disordered breathing. - Positional therapy avoiding supine position during sleep. - Avoid alcohol, sedatives and other CNS depressants that may worsen sleep apnea and disrupt normal sleep architecture. - Sleep hygiene should be reviewed to assess factors that may improve sleep quality. - Weight management and regular exercise should be initiated or continued if appropriate.  Armanda Magic Diplomate, American Board of Sleep Medicine  ELECTRONICALLY SIGNED ON:  12/31/2017, 11:04 AM [facility] PH: [phone]   FX: [phone] ACCREDITED BY THE AMERICAN ACADEMY OF SLEEP MEDICINE

## 2017-12-31 NOTE — Telephone Encounter (Signed)
Titration sent to pre cert/sleep pool

## 2018-01-01 NOTE — Telephone Encounter (Signed)
    Titration sent to pre cert/sleep pool        Informed patient of sleep study results and patient understanding was verbalized. Patient understands she has sleep apnea and  Dr Radford Pax recommends a  CPAP titration. Patient understands her titration study will be done at [facility] sleep lab. Patient understands she will receive a sleep packet in a week or so. Patient understands to call if she does not receive the sleep packet in a timely manner. Patient agrees with treatment and thanked me for call

## 2018-01-06 DIAGNOSIS — Z713 Dietary counseling and surveillance: Secondary | ICD-10-CM | POA: Diagnosis not present

## 2018-01-06 DIAGNOSIS — Z6836 Body mass index (BMI) 36.0-36.9, adult: Secondary | ICD-10-CM | POA: Diagnosis not present

## 2018-01-06 DIAGNOSIS — E669 Obesity, unspecified: Secondary | ICD-10-CM | POA: Diagnosis not present

## 2018-01-09 ENCOUNTER — Telehealth: Payer: Self-pay | Admitting: *Deleted

## 2018-01-09 MED ORDER — PROGESTERONE MICRONIZED 100 MG PO CAPS: ORAL_CAPSULE | ORAL | 1 refills | Status: DC

## 2018-01-09 NOTE — Telephone Encounter (Signed)
Pt called requesting refill progesterone [facility] mg [facility]. Rx sent.

## 2018-01-17 ENCOUNTER — Encounter: Payer: Self-pay | Admitting: Family Medicine

## 2018-01-17 ENCOUNTER — Ambulatory Visit (INDEPENDENT_AMBULATORY_CARE_PROVIDER_SITE_OTHER): Payer: Medicare Other | Admitting: Family Medicine

## 2018-01-17 DIAGNOSIS — S161XXS Strain of muscle, fascia and tendon at neck level, sequela: Secondary | ICD-10-CM | POA: Diagnosis not present

## 2018-01-17 MED ORDER — PREDNISONE 50 MG PO TABS
50.0000 mg | ORAL_TABLET | Freq: Every day | ORAL | 0 refills | Status: DC
Start: 2018-01-17 — End: 2018-02-25

## 2018-01-17 MED ORDER — GABAPENTIN 100 MG PO CAPS: 200.0000 mg | ORAL_CAPSULE | Freq: Every day | ORAL | 3 refills | Status: DC

## 2018-01-17 NOTE — Addendum Note (Signed)
Addended by: Freada Bergeron on: 01/17/2018 04:48 PM   Modules accepted: Orders
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
## 2018-01-17 NOTE — Patient Instructions (Signed)
Good to see you  Overall I think you will make some progress.  Lets calm it down some.  Prednisone daily for 5 days.  Gabapentin 200mg  at night See me again in 2-3 weeks and if not a lot better we may need to consider MRI
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
## 2018-01-19 ENCOUNTER — Other Ambulatory Visit: Payer: Self-pay | Admitting: Internal Medicine

## 2018-01-20 ENCOUNTER — Encounter: Payer: Self-pay | Admitting: *Deleted

## 2018-01-20 ENCOUNTER — Telehealth: Payer: Self-pay | Admitting: *Deleted

## 2018-01-20 DIAGNOSIS — G4733 Obstructive sleep apnea (adult) (pediatric): Secondary | ICD-10-CM | POA: Diagnosis not present

## 2018-01-20 DIAGNOSIS — Z6837 Body mass index (BMI) 37.0-37.9, adult: Secondary | ICD-10-CM | POA: Diagnosis not present

## 2018-01-20 DIAGNOSIS — E669 Obesity, unspecified: Secondary | ICD-10-CM | POA: Diagnosis not present

## 2018-01-20 MED ORDER — PROGESTERONE MICRONIZED 100 MG PO CAPS: ORAL_CAPSULE | ORAL | 0 refills | Status: DC

## 2018-01-20 NOTE — Telephone Encounter (Signed)
Patient is scheduled for lab study on February 07 2018. Patient understands her titration study will be done at [facility] sleep lab. Patient understands she will receive a sleep packet in a week or so. Patient understands to call if she does not receive the sleep packet in a timely manner. Patient agrees with treatment and thanked me for call

## 2018-01-20 NOTE — Telephone Encounter (Signed)
Pt called and left message stating she has a question about her medication, I called and got her voicemail, I asked her to call me.

## 2018-01-20 NOTE — Telephone Encounter (Signed)
Staff message sent to Gae Bon okay to schedule sleep study. No pre cert needed per protocol given to me by Chyrel Masson.

## 2018-01-20 NOTE — Telephone Encounter (Signed)
Pt called requesting refill on progesterone [facility] mg capsule [facility]. Rx sent.

## 2018-01-23 ENCOUNTER — Ambulatory Visit: Payer: Self-pay | Admitting: Physician Assistant

## 2018-01-29 ENCOUNTER — Telehealth: Payer: Self-pay

## 2018-01-29 NOTE — Telephone Encounter (Signed)
Could not verify pt insurance to start PA for gabapentin.  Left detailed message for pt to let us know who her rx carrier and some additional information needed in order to complete the request.

## 2018-01-29 NOTE — Telephone Encounter (Signed)
Pt stated that she does not want to take that medication. I am deleting the request.

## 2018-02-07 ENCOUNTER — Ambulatory Visit (HOSPITAL_BASED_OUTPATIENT_CLINIC_OR_DEPARTMENT_OTHER): Payer: Medicare Other | Attending: Cardiology | Admitting: Cardiology

## 2018-02-07 DIAGNOSIS — G4733 Obstructive sleep apnea (adult) (pediatric): Secondary | ICD-10-CM | POA: Diagnosis not present

## 2018-02-11 ENCOUNTER — Ambulatory Visit: Payer: Self-pay | Admitting: Family Medicine

## 2018-02-14 ENCOUNTER — Ambulatory Visit: Payer: Self-pay | Admitting: Physician Assistant

## 2018-02-22 NOTE — Procedures (Signed)
   Patient Name: Amanda Wilkins, Amanda Wilkins Date: 02/07/2018 Gender: Female D.O.B: 1946-06-21 Age (years): 59 Referring Provider: Fransico Him MD, ABSM Height (inches): 65 Interpreting Physician: Fransico Him MD, ABSM Weight (lbs): 217 RPSGT: Lanae Boast BMI: 36 MRN: [id number] Neck Size: 14.00  CLINICAL INFORMATION The patient is referred for a CPAP titration to treat sleep apnea.  SLEEP STUDY TECHNIQUE As per the AASM Manual for the Scoring of Sleep and Associated Events v2.3 (April 2016) with a hypopnea requiring 4% desaturations.  The channels recorded and monitored were frontal, central and occipital EEG, electrooculogram (EOG), submentalis EMG (chin), nasal and oral airflow, thoracic and abdominal wall motion, anterior tibialis EMG, snore microphone, electrocardiogram, and pulse oximetry. Continuous positive airway pressure (CPAP) was initiated at the beginning of the study and titrated to treat sleep-disordered breathing.  MEDICATIONS Medications self-administered by patient taken the night of the study : N/A  TECHNICIAN COMMENTS Comments added by technician: Patient had difficulty initiating sleep. Comments added by scorer: N/A  RESPIRATORY PARAMETERS Optimal PAP Pressure (cm): 12  AHI at Optimal Pressure (/hr):1.1 Overall Minimal O2 (%):86.0  Supine % at Optimal Pressure (%):100 Minimal O2 at Optimal Pressure (%): 93.0   SLEEP ARCHITECTURE The study was initiated at 10:55:24 PM and ended at 5:04:34 AM.  Sleep onset time was 94.7 minutes and the sleep efficiency was 54.6%%. The total sleep time was 201.4 minutes.  The patient spent 10.7%% of the night in stage N1 sleep, 86.6%% in stage N2 sleep, 1.0%% in stage N3 and 1.74% in REM.Stage REM latency was 173.0 minutes  Wake after sleep onset was 73.0. Alpha intrusion was absent. Supine sleep was 90.07%.  CARDIAC DATA The 2 lead EKG demonstrated sinus rhythm. The mean heart rate was 58.1 beats per minute.  Other EKG findings include: PVCs.  LEG MOVEMENT DATA The total Periodic Limb Movements of Sleep (PLMS) were 0. The PLMS index was 0.0. A PLMS index of <15 is considered normal in adults.  IMPRESSIONS - The optimal PAP pressure was 12 cm of water. - Central sleep apnea was not noted during this titration (CAI = 0.3/h). - Moderate oxygen desaturations were observed during this titration (min O2 = 86.0%). - The patient snored with moderate snoring volume during this titration study. - 2-lead EKG demonstrated: PVCs - Clinically significant periodic limb movements were not noted during this study. Arousals associated with PLMs were rare.  DIAGNOSIS - Obstructive Sleep Apnea (327.23 [G47.33 ICD-10])  RECOMMENDATIONS - Trial of CPAP therapy on 12 cm H2O with a Medium size Resmed Full Face Mask AirFit F20 mask and heated humidification. - Avoid alcohol, sedatives and other CNS depressants that may worsen sleep apnea and disrupt normal sleep architecture. - Sleep hygiene should be reviewed to assess factors that may improve sleep quality. - Weight management and regular exercise should be initiated or continued. - Return to Sleep Center for re-evaluation after 10 weeks of therapy

## 2018-02-24 ENCOUNTER — Telehealth: Payer: Self-pay | Admitting: *Deleted

## 2018-02-24 NOTE — Telephone Encounter (Signed)
-----   Message from Sueanne Margarita, MD sent at 02/22/2018  6:47 PM EDT ----- Please let patient know that they had a successful PAP titration and let DME know that orders are in EPIC.  Please set up 10 week OV with me.

## 2018-02-24 NOTE — Telephone Encounter (Signed)
Informed patient of sleep study results and patient understanding was verbalized. Patient understands her titration study showed she had a successful PAP titration and Dr Radford Pax has ordered him a cpap.  Upon patient request DME selection is [facility] Patient understands she will be contacted by [facility] to set up her cpap. Patient understands to call if [facility] does not contact HER with new setup in a timely manner. Patient understands they will be called once confirmation has been received from [facility] that they have received their new machine to schedule 10 week follow up appointment.  [facility] notified of new cpap order  Please add to airview Patient was grateful for the call and thanked me.

## 2018-02-24 NOTE — Telephone Encounter (Signed)
Called results LMTCB 

## 2018-02-25 ENCOUNTER — Ambulatory Visit (INDEPENDENT_AMBULATORY_CARE_PROVIDER_SITE_OTHER): Payer: Medicare Other | Admitting: Women's Health

## 2018-02-25 ENCOUNTER — Other Ambulatory Visit: Payer: Self-pay | Admitting: Women's Health

## 2018-02-25 ENCOUNTER — Encounter: Payer: Self-pay | Admitting: Women's Health

## 2018-02-25 VITALS — BP 134/72 | Temp 97.4°F | Wt 226.0 lb

## 2018-02-25 DIAGNOSIS — N390 Urinary tract infection, site not specified: Secondary | ICD-10-CM | POA: Diagnosis not present

## 2018-02-25 DIAGNOSIS — N898 Other specified noninflammatory disorders of vagina: Secondary | ICD-10-CM | POA: Diagnosis not present

## 2018-02-25 LAB — WET PREP FOR TRICH, YEAST, CLUE

## 2018-02-25 MED ORDER — CIPROFLOXACIN HCL 250 MG PO TABS: 250.0000 mg | ORAL_TABLET | Freq: Two times a day (BID) | ORAL | 0 refills | Status: DC

## 2018-02-25 NOTE — Patient Instructions (Signed)

## 2018-02-25 NOTE — Progress Notes (Signed)
HPI: 73 y/o MWF presents with urinary and vaginal complaints for 2 days.  c/o vaginal itching, dark yellow urine, bladder fullness, burning with urination, nausea, and lower bilateral abdominal pain. History includes UTI in 1970s and UTI with sepsis/ICU admission in 01/2015.  History of A. Fib, hypertension and hypercholesteremia, primary care manages.  On a weight loss program with [employer] at [facility]. Postmenopausal with no bleeding.  Exam: Appears well and no distress.  CVAT.  Abdomen slight tenderness low right and left quadrants without rebound or radiation of pain.  External genitalia mild erythema, speculum exam scant discharge without erythema or odor, wet prep negative. UA: +2 leukocytes, trace blood, 40-60 WBCs, 0-2 RBCs, 10-20 squamous epithelials,  many bacteria.   UTI / history of sepsis with UTI Mild vaginal itching with negative wet prep  Plan: Ciprofloxacin 250 mg BID for 3 days. Discussed UTI and yeast prevention. Informed to RTO in 2 weeks to recheck UA.  Urine Culture pending.  Instructed to call if persistent symptoms or if a fever.

## 2018-02-27 LAB — URINE CULTURE
MICRO NUMBER:: 90587446
SPECIMEN QUALITY: ADEQUATE

## 2018-02-28 ENCOUNTER — Ambulatory Visit (INDEPENDENT_AMBULATORY_CARE_PROVIDER_SITE_OTHER): Payer: Medicare Other | Admitting: Internal Medicine

## 2018-02-28 ENCOUNTER — Encounter: Payer: Self-pay | Admitting: Internal Medicine

## 2018-02-28 ENCOUNTER — Other Ambulatory Visit (INDEPENDENT_AMBULATORY_CARE_PROVIDER_SITE_OTHER): Payer: Medicare Other

## 2018-02-28 VITALS — BP 122/70 | HR 78 | Temp 97.9°F | Ht 65.0 in | Wt 226.0 lb

## 2018-02-28 DIAGNOSIS — M25561 Pain in right knee: Secondary | ICD-10-CM

## 2018-02-28 DIAGNOSIS — M79604 Pain in right leg: Secondary | ICD-10-CM | POA: Diagnosis not present

## 2018-02-28 LAB — CK: Total CK: 64 U/L (ref 7–177)

## 2018-02-28 LAB — COMPREHENSIVE METABOLIC PANEL
ALBUMIN: 4.2 g/dL (ref 3.5–5.2)
ALK PHOS: 56 U/L (ref 39–117)
ALT: 16 U/L (ref 0–35)
AST: 19 U/L (ref 0–37)
BILIRUBIN TOTAL: 0.5 mg/dL (ref 0.2–1.2)
BUN: 21 mg/dL (ref 6–23)
CO2: 27 mEq/L (ref 19–32)
CREATININE: 0.98 mg/dL (ref 0.40–1.20)
Calcium: 9.4 mg/dL (ref 8.4–10.5)
Chloride: 101 mEq/L (ref 96–112)
GFR: 59.42 mL/min — ABNORMAL LOW (ref >60.00)
Glucose, Bld: 91 mg/dL (ref 70–99)
Potassium: 4.3 mEq/L (ref 3.5–5.1)
SODIUM: 135 meq/L (ref 135–145)
TOTAL PROTEIN: 7.2 g/dL (ref 6.0–8.3)

## 2018-02-28 LAB — SEDIMENTATION RATE: Sed Rate: 13 mm/hr (ref 0–30)

## 2018-02-28 MED ORDER — DICLOFENAC SODIUM 1 % TD GEL: 2.0000 g | Freq: Four times a day (QID) | TRANSDERMAL | 0 refills | Status: DC

## 2018-02-28 NOTE — Progress Notes (Signed)
   Subjective:    Patient ID: Amanda Wilkins, female    DOB: Jun 04, 1946, 72 y.o.   MRN: [id number]  HPI The patient is a 72 YO female coming in for nausea but that has gotten better since she scheduled the visit. So she wants to find out about leg pain. Hurting in her shin region for the last week or so. She denies swelling, rash, injury, fall. She denies taking anything for it. Is aching all the time. Worse with sitting and hurts less when walking. She does have severe knee arthritis which is also worsening in the last several months.   Review of Systems  Constitutional: Positive for activity change. Negative for appetite change, chills, fatigue, fever and unexpected weight change.  Respiratory: Negative.   Cardiovascular: Negative.   Gastrointestinal: Negative.   Musculoskeletal: Positive for arthralgias and myalgias. Negative for back pain, gait problem and joint swelling.  Skin: Negative.   Neurological: Negative.       Objective:   Physical Exam  Constitutional: She is oriented to person, place, and time. She appears well-developed and well-nourished.  HENT:  Head: Normocephalic and atraumatic.  Eyes: EOM are normal.  Neck: Normal range of motion.  Cardiovascular: Normal rate and regular rhythm.  Pulmonary/Chest: Effort normal and breath sounds normal. No respiratory distress. She has no wheezes. She has no rales.  Abdominal: Soft. She exhibits no distension. There is no tenderness. There is no rebound.  Musculoskeletal: She exhibits tenderness. She exhibits no edema.  Tenderness along the shin region right, no calf tenderness or swelling.   Neurological: She is alert and oriented to person, place, and time. Coordination normal.  Skin: Skin is warm and dry.  Psychiatric: She has a normal mood and affect.   Vitals:   02/28/18 1301  BP: 122/70  Pulse: 78  Temp: 97.9 F (36.6 C)  TempSrc: Oral  SpO2: 97%  Weight: 226 lb (102.5 kg)  Height: 5\' 5"  (1.651 m)        Assessment & Plan:

## 2018-02-28 NOTE — Patient Instructions (Signed)
We are checking the labs today. We have sent in voltaren gel to use for pain on the area if needed up to 3 times per day.

## 2018-02-28 NOTE — Assessment & Plan Note (Signed)
Checking CK, ESR, CMP. Rx for voltaren gel.

## 2018-03-05 LAB — CULTURE INDICATED

## 2018-03-05 LAB — URINALYSIS, COMPLETE W/RFL CULTURE
BILIRUBIN URINE: NEGATIVE
Glucose, UA: NEGATIVE
Hyaline Cast: NONE SEEN /LPF
Ketones, ur: NEGATIVE
Nitrites, Initial: NEGATIVE
SPECIFIC GRAVITY, URINE: 1.02 (ref 1.001–1.03)
pH: 7 (ref 5.0–8.0)

## 2018-03-05 LAB — URINE CULTURE

## 2018-03-06 ENCOUNTER — Ambulatory Visit: Payer: Self-pay | Admitting: Physician Assistant

## 2018-03-11 ENCOUNTER — Other Ambulatory Visit: Payer: Medicare Other

## 2018-03-14 ENCOUNTER — Other Ambulatory Visit: Payer: Medicare Other

## 2018-03-14 DIAGNOSIS — N390 Urinary tract infection, site not specified: Secondary | ICD-10-CM

## 2018-03-14 NOTE — Telephone Encounter (Signed)
Patient has a 10 week follow up appointment scheduled for 8/22/ 2019 at 1:20. Patient understands she needs to keep this appointment for insurance compliance. Patient was grateful for the call and thanked me.

## 2018-03-15 LAB — URINALYSIS, COMPLETE W/RFL CULTURE
BACTERIA UA: NONE SEEN /HPF
Bilirubin Urine: NEGATIVE
GLUCOSE, UA: NEGATIVE
HGB URINE DIPSTICK: NEGATIVE
Hyaline Cast: NONE SEEN /LPF
KETONES UR: NEGATIVE
LEUKOCYTE ESTERASE: NEGATIVE
Nitrites, Initial: NEGATIVE
PH: 6.5 (ref 5.0–8.0)
Protein, ur: NEGATIVE
RBC / HPF: NONE SEEN /HPF (ref 0–2)
Specific Gravity, Urine: 1.014 (ref 1.001–1.03)
WBC UA: NONE SEEN /HPF (ref 0–5)

## 2018-03-15 LAB — NO CULTURE INDICATED

## 2018-03-16 ENCOUNTER — Encounter: Payer: Self-pay | Admitting: Physician Assistant

## 2018-03-16 NOTE — Progress Notes (Signed)
Cardiology Office Note    Date:  03/18/2018  ID:  Amanda Wilkins, DOB 1946-05-19, MRN [id number] PCP:  [name], MD  Cardiologist:  [name], MD   Chief Complaint: f/u arrhythmias  History of Present Illness:  Amanda Wilkins is a 72 y.o. female with history of paroxysmal atrial fib, PSVT, abnormal stress test in 2016, chronic-appearing hyponatremia, depression, chronic interstitial nephritis, obesity with recently diagnosed OSA who presents for 3 month cardiac follow-up.  To recap history, she has had known PAF for many years. In 2016 she had an abnormal stress test. Dr. [name] recommended cardiac CT but this was declined by insurance. Before further testing could be discussed, she was admitted to the hospital 01/2015 with recurrent persistent afib in the setting of sepsis, erysipelas, acute renal failure and heme positive stool with unremarkable EGD at that time. She was on Coumadin. Echo had shown EF 55-60%, no RWMA, mild MR, mild LAE, PASP 16mHg. F/u Holter shortly after that admission showed PAT but no recurrent afib so diltiazem was stopped. She has since been switched to Xarelto which she is tolerating without adverse event. Dr. RHarrington Challengermentioned that she had previously reviewed a CT angio that, although not specifically a dedicated study for coronary disease, did not mention significant coronary calcification in the report. There was mild atherosclerosis in the aorta. Dr. RHarrington Challengerwas not convinced of active ischemia/significant CAD so recommended continued surveillance of symptoms. In 10/2016 the patient wore another event monitor showing PAF and SVT. Flecainide was suggested but the patient declined due to side effect risk profile. She was last seen by BLyda JesterPA-C for breakthrough palpitations and it was suggested to titrate atenolol and patient was hesitant to do so. She was seen in the ER 11/2017 with new onset atrial flutter with abrupt onset after drinking cold  water. By the time she was brought back for evaluation, she was back in NSR spontaneously. The patient was then amenable to titrating atenolol to 564mdaily. I ordered a sleep study which was done 02/07/18 and positive for obstructive sleep apnea also showed episodic PVCs. She was initiated on CPAP therapy. Last labs 02/2018 showed Na 135, Cr 0.98, normal LFTs, normal CK/ESR, 11/2017 CBC wnl, 08/2017 TSH wnl, 01/2017 LDL 67 (followed by primary care).  She presents back for follow-up today overall doing well. In general PAF/PAFL are quiescent, but she does notice some rare "flip flop" palpitations particularly at night or when lying on left side. No bleeding. She is slowly using her CPAP more and more - initially 2 hours per night, now 4 hours per night. She expresses motivation to continue exercising and working on weight control.     Past Medical History:  Diagnosis Date  . Abnormal stress test    a. 2016 -> abnormal nuc. Dr. RoHarrington Challengerecommended cardiac CT but insurance declined. Due to lack of convincing anginal sx, Dr. RoHarrington Challengerecommended to continue to observe.  . Allergy   . Arthritis    ON BOTH KNEES  . Chronic interstitial nephritis   . Depression   . Hypertension   . Hyponatremia   . Migraines    on Zoloft for migraines  . Obesity   . OSA (obstructive sleep apnea)   . PAF ([place]ox[place]mal atrial [place]) [place]   a. remotely diagnosed. Was on Coumadin, now on Xarelto.  . Palpitations   . Parox[place]mal atrial flutter ([place]   a. dx 11/2017.  . [name] KitchenSVT (paroxysmal supraventricular tachycardia) ([facility]  .  Rib pain 02/26/2017   Related to back pain - r/o compr fx    Past Surgical History:  Procedure Laterality Date  . CESAREAN SECTION     x 1  . DENTAL SURGERY     multiple implants  . ESOPHAGOGASTRODUODENOSCOPY N/A 02/08/2015   Procedure: ESOPHAGOGASTRODUODENOSCOPY (EGD);  Surgeon: [name], MD;  Location: [place];  Service: Endoscopy;  Laterality: N/A;  . repair of torn meniscus  on left 08/2014 Left 08/2015   Dr[facility] Ronnie Derby at [facility]  . SHOULDER SURGERY Right April 04 2016    Current Medications: Current Meds  Medication Sig  . acetaminophen (TYLENOL) 650 MG CR tablet Take 650 mg by mouth every 8 (eight) hours as needed for pain.  [name] atenolol (TENORMIN) 50 MG tablet TAKE 1 TABLET BY MOUTH EVERY DAY  . b complex vitamins tablet Take 1 tablet by mouth daily.  [name] estradiol (VIVELLE-DOT) 0.1 MG/24HR patch Place 1 patch (0.1 mg total) onto the skin 2 (two) times a week.  . hydroxychloroquine (PLAQUENIL) 200 MG tablet Take 200 mg by mouth daily.  . Multiple Vitamin (MULTIVITAMIN) capsule Take 1 capsule by mouth daily.  . progesterone (PROMETRIUM) 100 MG capsule Take one tablet by mouth day 1-12 every month  . rivaroxaban (XARELTO) 20 MG TABS tablet Take 1 tablet (20 mg total) by mouth daily with supper.  . rosuvastatin (CRESTOR) 5 MG tablet Take 1 tablet (5 mg total) by mouth daily. Please dispense generic.  [name] sertraline (ZOLOFT) 50 MG tablet Take 50 mg by mouth daily.     Allergies:   Epinephrine base; Aspirin; Penicillins; and Tape   Social History   Socioeconomic History  . Marital status: Married    Spouse name: Not on file  . Number of children: 2  . Years of education: Not on file  . Highest education level: Not on file  Occupational History  . Occupation: retired    Fish farm manager: UNEMPLOYED  . 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 Financial resource strain: Not on file  . Food insecurity:    Worry: Not on file    Inability: Not on file  . Transportation needs:    Medical: Not on file    Non-medical: Not on file  Tobacco Use  . Smoking status: Never Smoker  . Smokeless tobacco: Never Used  . Tobacco comment: H/O social smoking at times when drinking--never a "habit"  Substance and Sexual Activity  . Alcohol use: Yes    Alcohol/week: 0.0 oz    Comment: Occas  . Drug use: No  . Sexual activity: Yes    Comment: intercourse age 70,  sexual partners more than 5  Lifestyle  . Physical activity:    Days per week: Not on file    Minutes per session: Not on file  . Stress: Not on file  Relationships  . Social connections:    Talks on phone: Not on file    Gets together: Not on file    Attends religious service: Not on file    Active member of club or organization: Not on file    Attends meetings of clubs or organizations: Not on file    Relationship status: Not on file  Other Topics Concern  . Not on file  Social History Narrative  . Not on file     Family History:  The patient's family history includes Lymphoma in her mother; Pancreatic cancer in her brother; Prostate cancer in her father.  ROS:  Please see the history of present illness. Exercising without difficulty. No CP or SOB reported. All other systems are reviewed and otherwise negative.    PHYSICAL EXAM:   VS:  BP 110/72   Pulse 76   Ht 5' 5"  (1.651 m)   Wt 227 lb (103 kg)   SpO2 98%   BMI 37.77 kg/m   BMI: Body mass index is 37.77 kg/m. GEN: Well nourished, well developed WF, in no acute distress HEENT: normocephalic, atraumatic Neck: no JVD or visible masses Cardiac: RRR; no murmurs, rubs, or gallops, no edema  Respiratory:  clear to auscultation bilaterally, normal work of breathing GI: soft, nontender, nondistended MS: no deformity or atrophy Skin: warm and dry, no rash Neuro:  Alert and Oriented x 3, Strength and sensation are intact, follows commands Psych: euthymic mood, full affect  Wt Readings from Last 3 Encounters:  03/18/18 227 lb (103 kg)  02/28/18 226 lb (102.5 kg)  02/25/18 226 lb (102.5 kg)      Studies/Labs Reviewed:   EKG:   EKG was not ordered today.  Recent Labs: 09/12/2017: TSH 3.12 12/10/2017: Hemoglobin 13.8; Platelets 191 02/28/2018: ALT 16; BUN 21; Creatinine, Ser 0.98; Potassium 4.3; Sodium 135   Lipid Panel    Component Value Date/Time   CHOL 161 01/31/2017 0807   TRIG 59.0 01/31/2017 0807   HDL  82.10 01/31/2017 0807   CHOLHDL 2 01/31/2017 0807   VLDL 11.8 01/31/2017 0807   LDLCALC 67 01/31/2017 0807    Additional studies/ records that were reviewed today include: Summarized above    ASSESSMENT & PLAN:   1. Paroxysmal atrial fib/flutter - in general, quiescent on atenolol. I am hopeful that treatment for her sleep apnea will also help keep recurrences at [place] as well. She does notice rare breakthrough palpitations but these sound more consistent with the isolated PVCs seen on her sleep study. They are relatively infrequent. Her blood pressure is now lower than last visit. This may continue to decline with treatment for her OSA. In the past she's had breakthrough AF while on atenolol 12m so will keep at 535mdaily but I've asked her to follow BP periodically at home and let usKoreanow if it starts to run low. 2. PSVT - quiescent. Continue regimen as above. 3. History of abnormal stress test - no recent anginal sx. Continue surveillance for symptoms. 4. OSA - congratulated her on her efforts to using CPAP, and encouraged continued compliance. She has OSA f/u with Dr. TuRadford Paxater this summer.  Disposition: F/u with Dr. RoHarrington Challengern 6-8 months.   Medication Adjustments/Labs and Tests Ordered: Current medicines are reviewed at length with the patient today.  Concerns regarding medicines are outlined above. Medication changes, Labs and Tests ordered today are summarized above and listed in the Patient Instructions accessible in Encounters.   Signed, DaCharlie PitterPA-C  03/18/2018 8:26 AM    [email or web address]  [id number]: [phone]: [phone]

## 2018-03-18 ENCOUNTER — Ambulatory Visit (INDEPENDENT_AMBULATORY_CARE_PROVIDER_SITE_OTHER): Payer: Medicare Other | Admitting: Physician Assistant

## 2018-03-18 ENCOUNTER — Encounter: Payer: Self-pay | Admitting: Physician Assistant

## 2018-03-18 VITALS — BP 110/72 | HR 76 | Ht 65.0 in | Wt 227.0 lb

## 2018-03-18 DIAGNOSIS — I480 Paroxysmal atrial fibrillation: Secondary | ICD-10-CM | POA: Diagnosis not present

## 2018-03-18 DIAGNOSIS — I471 Supraventricular tachycardia, unspecified: Secondary | ICD-10-CM

## 2018-03-18 DIAGNOSIS — G4733 Obstructive sleep apnea (adult) (pediatric): Secondary | ICD-10-CM | POA: Diagnosis not present

## 2018-03-18 DIAGNOSIS — Z9289 Personal history of other medical treatment: Secondary | ICD-10-CM | POA: Diagnosis not present

## 2018-03-18 DIAGNOSIS — I4892 Unspecified atrial flutter: Secondary | ICD-10-CM | POA: Diagnosis not present

## 2018-03-18 NOTE — Patient Instructions (Signed)
Medication Instructions:  Your physician recommends that you continue on your current medications as directed. Please refer to the Current Medication list given to you today.   Labwork: -None  Testing/Procedures: -None  Follow-Up: Your physician wants you to follow-up in: 6-8 months with Dr. [name].  You will receive a reminder letter in the mail two months in advance. If you don't receive a letter, please call our office to schedule the follow-up appointment.   Any Other Special Instructions Will Be Listed Below (If Applicable).     If you need a refill on your cardiac medications before your next appointment, please call your pharmacy.

## 2018-03-19 DIAGNOSIS — Z79899 Other long term (current) drug therapy: Secondary | ICD-10-CM | POA: Diagnosis not present

## 2018-03-19 DIAGNOSIS — H2513 Age-related nuclear cataract, bilateral: Secondary | ICD-10-CM | POA: Diagnosis not present

## 2018-03-19 DIAGNOSIS — H31091 Other chorioretinal scars, right eye: Secondary | ICD-10-CM | POA: Diagnosis not present

## 2018-04-09 ENCOUNTER — Telehealth: Payer: Self-pay | Admitting: Cardiology

## 2018-04-09 NOTE — Telephone Encounter (Signed)
OK per Dr Radford Pax,  Patients office appt has been rescheduled to 05/01/18 at 11:40.

## 2018-04-09 NOTE — Telephone Encounter (Signed)
New message:       Pt is calling and states she needs a sooner appt due to insurance and she would like something in July

## 2018-04-15 DIAGNOSIS — M25561 Pain in right knee: Secondary | ICD-10-CM | POA: Diagnosis not present

## 2018-04-15 DIAGNOSIS — Z6836 Body mass index (BMI) 36.0-36.9, adult: Secondary | ICD-10-CM | POA: Diagnosis not present

## 2018-04-15 DIAGNOSIS — E669 Obesity, unspecified: Secondary | ICD-10-CM | POA: Diagnosis not present

## 2018-04-15 DIAGNOSIS — M2550 Pain in unspecified joint: Secondary | ICD-10-CM | POA: Diagnosis not present

## 2018-04-15 DIAGNOSIS — Z79899 Other long term (current) drug therapy: Secondary | ICD-10-CM | POA: Diagnosis not present

## 2018-04-15 DIAGNOSIS — M545 Low back pain: Secondary | ICD-10-CM | POA: Diagnosis not present

## 2018-04-15 DIAGNOSIS — M0609 Rheumatoid arthritis without rheumatoid factor, multiple sites: Secondary | ICD-10-CM | POA: Diagnosis not present

## 2018-04-18 NOTE — Telephone Encounter (Signed)
Patient called back to say she has had a bad cold for 4 days and can not use her unit but will resume once she is well.

## 2018-04-18 NOTE — Telephone Encounter (Signed)
Informed patient of compliance results and patient understanding was verbalized. Patient is aware and agreeable to AHI being within range at 4.4. Patient is aware and agreeable to improving her compliance with machine usage.

## 2018-05-01 ENCOUNTER — Encounter: Payer: Self-pay | Admitting: Cardiology

## 2018-05-01 ENCOUNTER — Ambulatory Visit (INDEPENDENT_AMBULATORY_CARE_PROVIDER_SITE_OTHER): Payer: Medicare Other | Admitting: Cardiology

## 2018-05-01 VITALS — BP 120/68 | HR 70 | Ht 65.0 in | Wt 227.0 lb

## 2018-05-01 DIAGNOSIS — I10 Essential (primary) hypertension: Secondary | ICD-10-CM

## 2018-05-01 DIAGNOSIS — G4733 Obstructive sleep apnea (adult) (pediatric): Secondary | ICD-10-CM

## 2018-05-01 DIAGNOSIS — Z9989 Dependence on other enabling machines and devices: Secondary | ICD-10-CM | POA: Diagnosis not present

## 2018-05-01 DIAGNOSIS — Z79899 Other long term (current) drug therapy: Secondary | ICD-10-CM | POA: Diagnosis not present

## 2018-05-01 NOTE — Patient Instructions (Signed)
Medication Instructions:  Your physician recommends that you continue on your current medications as directed. Please refer to the Current Medication list given to you today.  Labwork: None   Testing/Procedures: None  Follow-Up: Your physician wants you to follow-up in: 1 Year with Dr. Radford Pax. You will receive a reminder letter in the mail two months in advance. If you don't receive a letter, please call our office to schedule the follow-up appointment.   If you need a refill on your cardiac medications before your next appointment, please call your pharmacy.

## 2018-05-01 NOTE — Progress Notes (Addendum)
Cardiology Office Note:    Date:  05/01/2018   ID:  Amanda Wilkins, DOB [birth year], MRN [id number]  PCP:  [name], MD  Cardiologist:  [name], MD    Referring MD: [name], MD   Chief Complaint  Patient presents with  . Sleep Apnea  . Hypertension    History of Present Illness:    Amanda Wilkins is a 72 y.o. female with a hx of paroxysmal atrial fibrillation and flutter, hypertension and recently diagnosed sleep apnea.  Due to ongoing hypertension, snoring atrial fibrillation sleep study was ordered by Sharrell Ku, PA.  This showed moderate obstructive sleep apnea with an AHI of 23.4/h and oxygen desaturations as low as 84%.  She subsequently underwent CPAP titration to 12 cm H2O.  She is now here for further evaluation.She is doing well with she CPAP device and thinks that she has gotten used to it.  She tolerates the mask and feels the pressure is adequate.  Since going on CPAP she feels rested in the am and has no significant daytime sleepiness.  She denies any significant mouth or nasal dryness or nasal congestion.  She does not think that he snores.  She did not use her device for 2 weeks recently due to severe head cold.  She is back using it though.   Past Medical History:  Diagnosis Date  . Abnormal stress test    a. 2016 -> abnormal nuc. Dr. [name] recommended cardiac CT but insurance declined. Due to lack of convincing anginal sx, Dr. [name] recommended to continue to observe.  . Allergy   . Arthritis    ON BOTH KNEES  . Chronic interstitial nephritis   . Depression   . Hypertension   . Hyponatremia   . Migraines    on Zoloft for migraines  . Obesity   . OSA (obstructive sleep apnea)     moderate obstructive sleep apnea with an AHI of 23.4/h and oxygen desaturations as low as 84%.  Now on CPAP at 12 cm H2O.  [name] PAF (paroxysmal atrial fibrillation) ([place])    a. remotely diagnosed. Was on Coumadin, now on Xarelto.  . Palpitations   .  Paroxysmal atrial flutter ([place])    a. dx 11/2017.  [name] PSVT (paroxysmal supraventricular tachycardia) ([place])   . Rib pain 02/26/2017   Related to back pain - r/o compr fx    Past Surgical History:  Procedure Laterality Date  . CESAREAN SECTION     x 1  . DENTAL SURGERY     multiple implants  . ESOPHAGOGASTRODUODENOSCOPY N/A 02/08/2015   Procedure: ESOPHAGOGASTRODUODENOSCOPY (EGD);  Surgeon: [name], MD;  Location: [place];  Service: Endoscopy;  Laterality: N/A;  . repair of torn meniscus on left 08/2014 Left 08/2015   Dr[facility] Ronnie Derby at [facility]  . SHOULDER SURGERY Right April 04 2016    Current Medications: No outpatient medications have been marked as taking for the 05/01/18 encounter (Office Visit) with Sueanne Margarita, MD.     Allergies:   Epinephrine base; Aspirin; Penicillins; and Tape   Social History   Socioeconomic History  . Marital status: Married    Spouse name: Not on file  . Number of children: 2  . Years of education: Not on file  . Highest education level: Not on file  Occupational History  . Occupation: retired    Fish farm manager: UNEMPLOYED  . 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 Financial resource strain: Not on file  .  Food insecurity:    Worry: Not on file    Inability: Not on file  . Transportation needs:    Medical: Not on file    Non-medical: Not on file  Tobacco Use  . Smoking status: Never Smoker  . Smokeless tobacco: Never Used  . Tobacco comment: H/O social smoking at times when drinking--never a "habit"  Substance and Sexual Activity  . Alcohol use: Yes    Alcohol/week: 0.0 oz    Comment: Occas  . Drug use: No  . Sexual activity: Yes    Comment: intercourse age 68, sexual partners more than 5  Lifestyle  . Physical activity:    Days per week: Not on file    Minutes per session: Not on file  . Stress: Not on file  Relationships  . Social connections:    Talks on phone: Not on file    Gets together: Not on file     Attends religious service: Not on file    Active member of club or organization: Not on file    Attends meetings of clubs or organizations: Not on file    Relationship status: Not on file  Other Topics Concern  . Not on file  Social History Narrative  . Not on file     Family History: The patient's family history includes Lymphoma in her mother; Pancreatic cancer in her brother; Prostate cancer in her father.  ROS:   Please see the history of present illness.    ROS  All other systems reviewed and negative.   EKGs/Labs/Other Studies Reviewed:    The following studies were reviewed today: PAP download  EKG:  EKG is not ordered today.   Recent Labs: 09/12/2017: TSH 3.12 12/10/2017: Hemoglobin 13.8; Platelets 191 02/28/2018: ALT 16; BUN 21; Creatinine, Ser 0.98; Potassium 4.3; Sodium 135   Recent Lipid Panel    Component Value Date/Time   CHOL 161 01/31/2017 0807   TRIG 59.0 01/31/2017 0807   HDL 82.10 01/31/2017 0807   CHOLHDL 2 01/31/2017 0807   VLDL 11.8 01/31/2017 0807   LDLCALC 67 01/31/2017 0807    Physical Exam:    VS:  There were no vitals taken for this visit.    Wt Readings from Last 3 Encounters:  03/18/18 227 lb (103 kg)  02/28/18 226 lb (102.5 kg)  02/25/18 226 lb (102.5 kg)     GEN:  Well nourished, well developed in no acute distress HEENT: Normal NECK: No JVD; No carotid bruits LYMPHATICS: No lymphadenopathy CARDIAC: RRR, no murmurs, rubs, gallops RESPIRATORY:  Clear to auscultation without rales, wheezing or rhonchi  ABDOMEN: Soft, non-tender, non-distended MUSCULOSKELETAL:  No edema; No deformity  SKIN: Warm and dry NEUROLOGIC:  Alert and oriented x 3 PSYCHIATRIC:  Normal affect   ASSESSMENT:    1. OSA on CPAP   2. Essential hypertension    PLAN:    In order of problems listed above:  1.  OSA - the patient is tolerating PAP therapy well without any problems. The PAP download was reviewed today and showed an AHI of 4.1/hr on 12 cm  H2O with 57% compliance in using more than 4 hours nightly.  The patient has been using and benefiting from PAP use and will continue to benefit from therapy.  She was not able to use her device for 2 weeks because of a severe head cold and she uses a nasal mask.  I encouraged her to be more compliant with her device.  I encouraged her  to be more compliant with her device and educated her that if she is not using her device at least 4 hours 70% of the time insurance will require her to return her device.  Also encouraged her to use the device nightly for at least 6 hours to get the most benefit from her CPAP.  2.  HTN - BP is controlled on exam today. She will continue on atenolol 50 mg daily.     Medication Adjustments/Labs and Tests Ordered: Current medicines are reviewed at length with the patient today.  Concerns regarding medicines are outlined above.  No orders of the defined types were placed in this encounter.  No orders of the defined types were placed in this encounter.   Signed, Fransico Him, MD  05/01/2018 11:38 AM    [place]

## 2018-05-02 ENCOUNTER — Encounter: Payer: Medicare Other | Admitting: Obstetrics & Gynecology

## 2018-05-02 DIAGNOSIS — H00022 Hordeolum internum right lower eyelid: Secondary | ICD-10-CM | POA: Diagnosis not present

## 2018-05-05 ENCOUNTER — Ambulatory Visit (INDEPENDENT_AMBULATORY_CARE_PROVIDER_SITE_OTHER): Payer: Medicare Other | Admitting: Obstetrics & Gynecology

## 2018-05-05 ENCOUNTER — Encounter: Payer: Self-pay | Admitting: Obstetrics & Gynecology

## 2018-05-05 VITALS — BP 128/84 | Ht 64.75 in | Wt 210.0 lb

## 2018-05-05 DIAGNOSIS — E6609 Other obesity due to excess calories: Secondary | ICD-10-CM | POA: Diagnosis not present

## 2018-05-05 DIAGNOSIS — Z124 Encounter for screening for malignant neoplasm of cervix: Secondary | ICD-10-CM

## 2018-05-05 DIAGNOSIS — Z6835 Body mass index (BMI) 35.0-35.9, adult: Secondary | ICD-10-CM | POA: Diagnosis not present

## 2018-05-05 DIAGNOSIS — Z9189 Other specified personal risk factors, not elsewhere classified: Secondary | ICD-10-CM | POA: Diagnosis not present

## 2018-05-05 DIAGNOSIS — Z79890 Hormone replacement therapy (postmenopausal): Secondary | ICD-10-CM

## 2018-05-05 DIAGNOSIS — Z01419 Encounter for gynecological examination (general) (routine) without abnormal findings: Secondary | ICD-10-CM

## 2018-05-05 DIAGNOSIS — N950 Postmenopausal bleeding: Secondary | ICD-10-CM | POA: Diagnosis not present

## 2018-05-05 NOTE — Patient Instructions (Signed)
1. Encounter for routine gynecological examination with Papanicolaou smear of cervix Normal gynecologic exam and menopause.  Pap reflex done today, will probably be the last one if negative.  Breast exam normal.  Last mammogram in July 2018 was abnormal but a right breast biopsy revealed pseudo-angiomatous stromal hyperplasia with no malignancy identified.  Colonoscopy in 2012.  Health labs with family physician.  2. Post-menopause on HRT (hormone replacement therapy) Menopausal syndrome on hormone replacement therapy for well above 10 years.  Has tried twice to stop hormone replacement therapy for over a year with continued severe vasomotor symptoms.  Had an episode of very heavy postmenopausal bleeding in January 2019.  Strongly recommend to stop her estrogen patch at this time.  Will take the Prometrium 100 mg daily for 12 days and f/u for a pelvic US a week later.  3. Postmenopausal bleeding Heavy postmenopausal bleeding in January 2019.  D/C Estradiol 0.1 patch.  Will take Prometrium 100 mg okay thank you I am coming 12 days and f/u Pelvic US.  Rule out endometrial pathology such as polyps, fibroids, endometrial hyperplasia and endometrial cancer.  - US Transvaginal Non-OB; Future  4. Class 2 obesity due to excess calories without serious comorbidity with body mass index (BMI) of 35.0 to 35.9 in adult Recommend lower calorie/carb diet such as [place] and regular physical activity with aerobic activities 5 times a week and weightlifting every 2 days.  Amanda Wilkins, it was a pleasure meeting you today!  I will inform you of your results as soon as they are available.

## 2018-05-05 NOTE — Progress Notes (Signed)
Amanda Wilkins 05-24-1946 [id number]   History:    72 y.o. G2P2L2 Remarried x about 20 years.  RP:  Established patient presenting for annual gyn exam   HPI:  Still on HRT Estradiol patch 0.1 half a patch weekly/Prometrium 100 mg 12 days a month, because of persistent vasomotor symptoms when attempted to stop.  Very heavy PMB in 10/2017 while on a cruise.  May have received Tranexamic acid?  The medication given helped decrease the bleeding.  Lighter withdrawal bleeding since then.  No pelvic pain.  Sexually active with no pain.  Urine/BMs wnl.  Breasts wnl.  Health labs with Fam MD.  Past medical history,surgical history, family history and social history were all reviewed and documented in the EPIC chart.  Gynecologic History No LMP recorded. Patient is postmenopausal. Contraception: post menopausal status Last Pap: 2012. Results were: Negative Last mammogram: 04/2017 abnormal.  Rt breast Bx PSEUDOANGIOMATOUS STROMAL HYPERPLASIA ([place]). NO MALIGNANCY IDENTIFIED. Bone Density:Normal in 05/2017 Colonoscopy: 2012  Obstetric History OB History  Gravida Para Term Preterm AB Living  2 2 2     2   SAB TAB Ectopic Multiple Live Births          2    # Outcome Date GA Lbr Len/2nd Weight Sex Delivery Anes PTL Lv  2 Term     M Vag-Spont  N LIV  1 Term     M CS-Unspec  N LIV     ROS: A ROS was performed and pertinent positives and negatives are included in the history.  GENERAL: No fevers or chills. HEENT: No change in vision, no earache, sore throat or sinus congestion. NECK: No pain or stiffness. CARDIOVASCULAR: No chest pain or pressure. No palpitations. PULMONARY: No shortness of breath, cough or wheeze. GASTROINTESTINAL: No abdominal pain, nausea, vomiting or diarrhea, melena or bright red blood per rectum. GENITOURINARY: No urinary frequency, urgency, hesitancy or dysuria. MUSCULOSKELETAL: No joint or muscle pain, no back pain, no recent trauma. DERMATOLOGIC: No rash, no itching, no  lesions. ENDOCRINE: No polyuria, polydipsia, no heat or cold intolerance. No recent change in weight. HEMATOLOGICAL: No anemia or easy bruising or bleeding. NEUROLOGIC: No headache, seizures, numbness, tingling or weakness. PSYCHIATRIC: No depression, no loss of interest in normal activity or change in sleep pattern.     Exam:   BP 128/84   Ht 5' 4.75" (1.645 m)   Wt 210 lb (95.3 kg)   BMI 35.22 kg/m   Body mass index is 35.22 kg/m.  General appearance : Well developed well nourished female. No acute distress HEENT: Eyes: no retinal hemorrhage or exudates,  Neck supple, trachea midline, no carotid bruits, no thyroidmegaly Lungs: Clear to auscultation, no rhonchi or wheezes, or rib retractions  Heart: Regular rate and rhythm, no murmurs or gallops Breast:Examined in sitting and supine position were symmetrical in appearance, no palpable masses or tenderness,  no skin retraction, no nipple inversion, no nipple discharge, no skin discoloration, no axillary or supraclavicular lymphadenopathy Abdomen: no palpable masses or tenderness, no rebound or guarding Extremities: no edema or skin discoloration or tenderness  Pelvic: Vulva: Normal             Vagina: No gross lesions or discharge  Cervix: No gross lesions or discharge.  Pap reflex done.  Uterus  AV, normal size, shape and consistency, non-tender and mobile  Adnexa  Without masses or tenderness  Anus: Normal   Assessment/Plan:  72 y.o. female for annual exam   1. Encounter for  routine gynecological examination with Papanicolaou smear of cervix Normal gynecologic exam and menopause.  Pap reflex done today, will probably be the last one if negative.  Breast exam normal.  Last mammogram in July 2018 was abnormal but a right breast biopsy revealed pseudo-angiomatous stromal hyperplasia with no malignancy identified.  Colonoscopy in 2012.  Health labs with family physician.  2. Post-menopause on HRT (hormone replacement  therapy) Menopausal syndrome on hormone replacement therapy for well above 10 years.  Has tried twice to stop hormone replacement therapy for over a year with continued severe vasomotor symptoms.  Had an episode of very heavy postmenopausal bleeding in January 2019.  Strongly recommend to stop her estrogen patch at this time.  Will take the Prometrium 100 mg daily for 12 days and f/u for a pelvic US a week later.  3. Postmenopausal bleeding Heavy postmenopausal bleeding in January 2019.  D/C Estradiol 0.1 patch.  Will take Prometrium 100 mg okay thank you I am coming 12 days and f/u Pelvic US.  Rule out endometrial pathology such as polyps, fibroids, endometrial hyperplasia and endometrial cancer.  - US Transvaginal Non-OB; Future  4. Class 2 obesity due to excess calories without serious comorbidity with body mass index (BMI) of 35.0 to 35.9 in adult Recommend lower calorie/carb diet such as [employer] and regular physical activity with aerobic activities 5 times a week and weightlifting every 2 days.  Counseling on above issues and coordination of care more than 50% for 15 minutes.  Princess Bruins MD, 12:25 PM 05/05/2018

## 2018-05-06 LAB — PAP IG W/ RFLX HPV ASCU

## 2018-05-07 ENCOUNTER — Encounter: Payer: Self-pay | Admitting: *Deleted

## 2018-05-08 DIAGNOSIS — H00022 Hordeolum internum right lower eyelid: Secondary | ICD-10-CM | POA: Diagnosis not present

## 2018-05-08 DIAGNOSIS — Z79899 Other long term (current) drug therapy: Secondary | ICD-10-CM | POA: Diagnosis not present

## 2018-05-12 ENCOUNTER — Telehealth: Payer: Self-pay | Admitting: *Deleted

## 2018-05-12 NOTE — Telephone Encounter (Signed)
Patient was told to stop vivelle dot 0.1 mg patch on 05/05/18 at [facility] and she did so, has one dose of progesterone 100 mg day 1-12 to take next month with ultrasound scheduled on 06/05/18. Patient said 1 day later she started a light period flow, feels emotionally different (feels like she wants to cry) nauseated, no vomiting. I told her this may be related to her stopping the estrogen after taking for years. She asked me to relay this information by you. States she doesn't like how she feels. Please advise

## 2018-05-13 ENCOUNTER — Other Ambulatory Visit: Payer: Self-pay | Admitting: Obstetrics & Gynecology

## 2018-05-13 DIAGNOSIS — Z1231 Encounter for screening mammogram for malignant neoplasm of breast: Secondary | ICD-10-CM

## 2018-05-14 NOTE — Telephone Encounter (Signed)
Please reassure patient, will hopefully adjust with time.  Recommend increased physical activity, like walks, to release endorphins which will help.  Will discuss at Pelvic US visit.

## 2018-05-14 NOTE — Telephone Encounter (Signed)
Spoke with patient and read her Dr. Mariah Milling note. Patient said already she is feeling so much better. She did exercise a lot yesterday with 8000 steps and swimming and felt so much better. Today feels like a really good day she said.

## 2018-05-23 ENCOUNTER — Encounter: Payer: Self-pay | Admitting: Internal Medicine

## 2018-05-23 ENCOUNTER — Other Ambulatory Visit: Payer: Self-pay | Admitting: *Deleted

## 2018-05-23 ENCOUNTER — Ambulatory Visit (INDEPENDENT_AMBULATORY_CARE_PROVIDER_SITE_OTHER): Payer: Medicare Other | Admitting: Internal Medicine

## 2018-05-23 VITALS — BP 126/78 | HR 67 | Ht 64.75 in | Wt 225.0 lb

## 2018-05-23 DIAGNOSIS — E785 Hyperlipidemia, unspecified: Secondary | ICD-10-CM | POA: Diagnosis not present

## 2018-05-23 DIAGNOSIS — E559 Vitamin D deficiency, unspecified: Secondary | ICD-10-CM | POA: Diagnosis not present

## 2018-05-23 DIAGNOSIS — L609 Nail disorder, unspecified: Secondary | ICD-10-CM

## 2018-05-23 DIAGNOSIS — R202 Paresthesia of skin: Secondary | ICD-10-CM

## 2018-05-23 MED ORDER — ROSUVASTATIN CALCIUM 5 MG PO TABS: 5.0000 mg | ORAL_TABLET | Freq: Every day | ORAL | 1 refills | Status: DC

## 2018-05-23 MED ORDER — METHYLPREDNISOLONE 4 MG PO TBPK: ORAL_TABLET | ORAL | 0 refills | Status: DC

## 2018-05-23 NOTE — Assessment & Plan Note (Signed)
Slow growth ?etiol 4-5 month ?plaquenil related vs other Just had labs w/Dr [employer] incl TSH
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
## 2018-05-28 ENCOUNTER — Other Ambulatory Visit (INDEPENDENT_AMBULATORY_CARE_PROVIDER_SITE_OTHER): Payer: Medicare Other

## 2018-05-28 DIAGNOSIS — R202 Paresthesia of skin: Secondary | ICD-10-CM | POA: Diagnosis not present

## 2018-05-28 DIAGNOSIS — E559 Vitamin D deficiency, unspecified: Secondary | ICD-10-CM | POA: Diagnosis not present

## 2018-05-28 DIAGNOSIS — E785 Hyperlipidemia, unspecified: Secondary | ICD-10-CM | POA: Diagnosis not present

## 2018-05-28 DIAGNOSIS — L609 Nail disorder, unspecified: Secondary | ICD-10-CM | POA: Diagnosis not present

## 2018-05-28 LAB — LIPID PANEL
Cholesterol: 167 mg/dL (ref 0–200)
HDL: 98.9 mg/dL (ref >39.00)
LDL Cholesterol: 54 mg/dL (ref 0–99)
NonHDL: 68.17
TRIGLYCERIDES: 69 mg/dL (ref 0.0–149.0)
Total CHOL/HDL Ratio: 2
VLDL: 13.8 mg/dL (ref 0.0–40.0)

## 2018-05-28 LAB — VITAMIN B12: Vitamin B-12: 515 pg/mL (ref 211–911)

## 2018-05-28 LAB — VITAMIN D 25 HYDROXY (VIT D DEFICIENCY, FRACTURES): VITD: 41.73 ng/mL (ref 30.00–100.00)

## 2018-05-28 LAB — TSH: TSH: 3.16 u[IU]/mL (ref 0.35–4.50)

## 2018-05-31 ENCOUNTER — Encounter (HOSPITAL_COMMUNITY): Payer: Self-pay | Admitting: Emergency Medicine

## 2018-05-31 ENCOUNTER — Emergency Department (HOSPITAL_COMMUNITY)
Admission: EM | Admit: 2018-05-31 | Discharge: 2018-05-31 | Disposition: A | Discharge status: Home / Self Care | Payer: Medicare Other | Attending: Emergency Medicine | Admitting: Emergency Medicine

## 2018-05-31 ENCOUNTER — Emergency Department (HOSPITAL_COMMUNITY): Payer: Medicare Other

## 2018-05-31 DIAGNOSIS — D689 Coagulation defect, unspecified: Secondary | ICD-10-CM | POA: Diagnosis not present

## 2018-05-31 DIAGNOSIS — Y999 Unspecified external cause status: Secondary | ICD-10-CM | POA: Diagnosis not present

## 2018-05-31 DIAGNOSIS — Y929 Unspecified place or not applicable: Secondary | ICD-10-CM | POA: Insufficient documentation

## 2018-05-31 DIAGNOSIS — W01198A Fall on same level from slipping, tripping and stumbling with subsequent striking against other object, initial encounter: Secondary | ICD-10-CM | POA: Diagnosis not present

## 2018-05-31 DIAGNOSIS — Z7901 Long term (current) use of anticoagulants: Secondary | ICD-10-CM | POA: Insufficient documentation

## 2018-05-31 DIAGNOSIS — S0083XA Contusion of other part of head, initial encounter: Secondary | ICD-10-CM | POA: Diagnosis not present

## 2018-05-31 DIAGNOSIS — I10 Essential (primary) hypertension: Secondary | ICD-10-CM | POA: Insufficient documentation

## 2018-05-31 DIAGNOSIS — S0990XA Unspecified injury of head, initial encounter: Secondary | ICD-10-CM | POA: Diagnosis not present

## 2018-05-31 DIAGNOSIS — Z79899 Other long term (current) drug therapy: Secondary | ICD-10-CM | POA: Insufficient documentation

## 2018-05-31 DIAGNOSIS — R51 Headache: Secondary | ICD-10-CM | POA: Diagnosis not present

## 2018-05-31 DIAGNOSIS — Y939 Activity, unspecified: Secondary | ICD-10-CM | POA: Diagnosis not present

## 2018-05-31 NOTE — Discharge Instructions (Addendum)
Return if any worsening headache, weakness, or other problems.

## 2018-05-31 NOTE — ED Triage Notes (Signed)
Pt presents to ED for assessment after she fell forward while wiping grease off the floor into the cabinet and hit the right front side of her head on the knob.  Bruising noted.  Slip and fall happened Thrusday.  Denies LOC.  Patient is on Xarelto.
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
## 2018-05-31 NOTE — ED Notes (Signed)
Pt given d/c instructions. No questions verbalized at time of d/c. Pt ambulated to lobby with husband.

## 2018-06-02 ENCOUNTER — Telehealth: Payer: Self-pay | Admitting: Internal Medicine

## 2018-06-02 NOTE — Telephone Encounter (Signed)
Pt went to ED

## 2018-06-02 NOTE — Telephone Encounter (Signed)
Patient called [facility] on 05/31/18 at 12:42pm.  States she hit her head on Thursday after a slip and fall and is on warfarin.  Patient was advised to go to ED right away.

## 2018-06-05 ENCOUNTER — Encounter: Payer: Self-pay | Admitting: Obstetrics & Gynecology

## 2018-06-05 ENCOUNTER — Ambulatory Visit (INDEPENDENT_AMBULATORY_CARE_PROVIDER_SITE_OTHER): Payer: Medicare Other

## 2018-06-05 ENCOUNTER — Ambulatory Visit (INDEPENDENT_AMBULATORY_CARE_PROVIDER_SITE_OTHER): Payer: Medicare Other | Admitting: Obstetrics & Gynecology

## 2018-06-05 ENCOUNTER — Ambulatory Visit: Payer: Self-pay | Admitting: Cardiology

## 2018-06-05 DIAGNOSIS — Z780 Asymptomatic menopausal state: Secondary | ICD-10-CM

## 2018-06-05 DIAGNOSIS — M170 Bilateral primary osteoarthritis of knee: Secondary | ICD-10-CM | POA: Diagnosis not present

## 2018-06-05 DIAGNOSIS — G8929 Other chronic pain: Secondary | ICD-10-CM | POA: Diagnosis not present

## 2018-06-05 DIAGNOSIS — N950 Postmenopausal bleeding: Secondary | ICD-10-CM | POA: Diagnosis not present

## 2018-06-05 NOTE — Patient Instructions (Signed)
1. Postmenopausal bleeding No PMB since stopped HRT.  Endometrial line normal and thin at 4.2 mm.  Pelvic US findings reviewed with patient.  Reassured.  2. Menopause present Very mild rare vasomotor menopausal Sx since stopped HRT on 05/05/2018.  Very happy to finally have succeeded stopping HRT.    Amanda Wilkins, it was a pleasure seeing you today!

## 2018-06-05 NOTE — Progress Notes (Signed)
    Amanda Wilkins [birth year] [id number]        72 y.o.  G2P2002  Remarried x 20 yrs   RP: Postmenopausal bleeding on HRT for Pelvic US  HPI: No recurrence of PMB since stopped HRT 05/05/2018.  Very little hot flushes/night sweats off HRT.  No pelvic pain.   OB History  Gravida Para Term Preterm AB Living  2 2 2     2   SAB TAB Ectopic Multiple Live Births          2    # Outcome Date GA Lbr Len/2nd Weight Sex Delivery Anes PTL Lv  2 Term     M Vag-Spont  N LIV  1 Term     M CS-Unspec  N LIV    Past medical history,surgical history, problem list, medications, allergies, family history and social history were all reviewed and documented in the EPIC chart.   Directed ROS with pertinent positives and negatives documented in the history of present illness/assessment and plan.  Exam:  There were no vitals filed for this visit. General appearance:  Normal  Pelvic US today: T/V and T/A images.  Retroflexed homogeneous uterus measuring 6.85 x 3.68 x 3.29 cm.  Endometrial line thin and normal at 4.2 mm.  Right ovary atrophic with normal echogenicity.  Left ovary not seen.  No apparent mass in the left adnexa.  No free fluid in the posterior cul-de-sac.   Assessment/Plan:  72 y.o. G2P2002   1. Postmenopausal bleeding No PMB since stopped HRT.  Endometrial line normal and thin at 4.2 mm.  Pelvic US findings reviewed with patient.  Reassured.  2. Menopause present Very mild rare vasomotor menopausal Sx since stopped HRT on 05/05/2018.  Very happy to finally have succeeded stopping HRT.    Counseling on above issues and coordination of care more than 50% for 15 minutes.  Princess Bruins MD, 2:48 PM 06/05/2018

## 2018-06-06 ENCOUNTER — Ambulatory Visit
Admission: RE | Admit: 2018-06-06 | Discharge: 2018-06-06 | Disposition: A | Discharge status: Home / Self Care | Payer: Medicare Other | Source: Ambulatory Visit | Attending: Obstetrics & Gynecology | Admitting: Obstetrics & Gynecology

## 2018-06-06 DIAGNOSIS — Z1231 Encounter for screening mammogram for malignant neoplasm of breast: Secondary | ICD-10-CM | POA: Diagnosis not present

## 2018-06-09 DIAGNOSIS — M1711 Unilateral primary osteoarthritis, right knee: Secondary | ICD-10-CM | POA: Diagnosis not present

## 2018-06-09 DIAGNOSIS — G8929 Other chronic pain: Secondary | ICD-10-CM | POA: Diagnosis not present

## 2018-06-18 ENCOUNTER — Ambulatory Visit (INDEPENDENT_AMBULATORY_CARE_PROVIDER_SITE_OTHER): Payer: Medicare Other | Admitting: Internal Medicine

## 2018-06-18 ENCOUNTER — Encounter: Payer: Self-pay | Admitting: Internal Medicine

## 2018-06-18 VITALS — BP 140/80 | HR 71 | Ht 64.75 in | Wt 229.0 lb

## 2018-06-18 DIAGNOSIS — I481 Persistent atrial fibrillation: Secondary | ICD-10-CM | POA: Diagnosis not present

## 2018-06-18 DIAGNOSIS — E785 Hyperlipidemia, unspecified: Secondary | ICD-10-CM | POA: Diagnosis not present

## 2018-06-18 DIAGNOSIS — Z01818 Encounter for other preprocedural examination: Secondary | ICD-10-CM | POA: Diagnosis not present

## 2018-06-18 DIAGNOSIS — I4819 Other persistent atrial fibrillation: Secondary | ICD-10-CM

## 2018-06-18 DIAGNOSIS — R635 Abnormal weight gain: Secondary | ICD-10-CM

## 2018-06-18 DIAGNOSIS — F4323 Adjustment disorder with mixed anxiety and depressed mood: Secondary | ICD-10-CM | POA: Diagnosis not present

## 2018-06-18 DIAGNOSIS — I10 Essential (primary) hypertension: Secondary | ICD-10-CM

## 2018-06-18 NOTE — Progress Notes (Signed)
Subjective:  Patient ID: Amanda Wilkins, female    DOB: 11/15/1945  Age: 72 y.o. MRN: [id number]  CC: No chief complaint on file.   HPI Amanda Wilkins presents for IM consult Reason: pre-op clearance for the R knee surgery Req by Dr Ronnie Derby  Hx: F/u A flutter - stable. C/o a fall in the kitchen. She slipped on grease 3 wks ago. She went to [facility] - had a head (-) CT. R knee pain - worse after Synvisc injection a week ago - better now. F/u anticoagulation, HTN, dyslipidemia - stable.  Past Medical History:  Diagnosis Date  . Abnormal stress test    a. 2016 -> abnormal nuc. Dr. [name] recommended cardiac CT but insurance declined. Due to lack of convincing anginal sx, Dr. [name] recommended to continue to observe.  . Allergy   . Arthritis    ON BOTH KNEES  . Chronic interstitial nephritis   . Depression   . Hypertension   . Hyponatremia   . Migraines    on Zoloft for migraines  . Obesity   . OSA (obstructive sleep apnea)     moderate obstructive sleep apnea with an AHI of 23.4/h and oxygen desaturations as low as 84%.  Now on CPAP at 12 cm H2O.  [name] PAF (paroxysmal atrial fibrillation) ([place])    a. remotely diagnosed. Was on Coumadin, now on Xarelto.  . Palpitations   . Paroxysmal atrial flutter ([place])    a. dx 11/2017.  [name] PSVT (paroxysmal supraventricular tachycardia) ([place])   . Rib pain 02/26/2017   Related to back pain - r/o compr fx   Past Surgical History:  Procedure Laterality Date  . CESAREAN SECTION     x 1  . DENTAL SURGERY     multiple implants  . ESOPHAGOGASTRODUODENOSCOPY N/A 02/08/2015   Procedure: ESOPHAGOGASTRODUODENOSCOPY (EGD);  Surgeon: [name], MD;  Location: [facility];  Service: Endoscopy;  Laterality: N/A;  . repair of torn meniscus on left 08/2014 Left 08/2015   Dr. Ronnie Derby at [facility]  . SHOULDER SURGERY Right April 04 2016    reports that she has never smoked. She has never used smokeless tobacco. She reports that she drinks alcohol. She  reports that she does not use drugs. family history includes Breast cancer (age of onset: 58) in her sister; Lymphoma in her mother; Pancreatic cancer in her brother; Prostate cancer in her father. Allergies  Allergen Reactions  . Epinephrine Base Other (See Comments)    Seriously increases heart rate  . Aspirin Other (See Comments)    Can take the coated 325 mg. Plain asa 325 mg speeds up the heart.  . Penicillins Other (See Comments)    Pt previously has been told not to take because of family history of reactions (water blisters). She took amoxicillin in 2012-2013 with no reaction  . Synvisc [[place]ylan G-F 20]     Very painful  . Tape Other (See Comments)    Blisters from adhesive tape     Outpatient Medications Prior to Visit  Medication Sig Dispense Refill  . acetaminophen (TYLENOL) 650 MG CR tablet Take 650 mg by mouth every 8 (eight) hours as needed for pain.    [name] atenolol (TENORMIN) 50 MG tablet TAKE 1 TABLET BY MOUTH EVERY DAY 30 tablet 11  . b complex vitamins tablet Take 1 tablet by mouth daily. 100 tablet 3  . Biotin 10000 MCG TABS Take 1 each by mouth daily.    [name] estradiol (VIVELLE-DOT) 0.1 MG/24HR  patch Place 1 patch (0.1 mg total) onto the skin 2 (two) times a week. 24 patch 3  . hydroxychloroquine (PLAQUENIL) 200 MG tablet Take 200 mg by mouth daily.    . methylPREDNISolone (MEDROL DOSEPAK) 4 MG TBPK tablet As directed 21 tablet 0  . Multiple Vitamin (MULTIVITAMIN) capsule Take 1 capsule by mouth daily.    . progesterone (PROMETRIUM) 100 MG capsule Take one tablet by mouth day 1-12 every month 36 capsule 0  . rivaroxaban (XARELTO) 20 MG TABS tablet Take 1 tablet (20 mg total) by mouth daily with supper. 90 tablet 3  . rosuvastatin (CRESTOR) 5 MG tablet Take 1 tablet (5 mg total) by mouth daily. Please dispense generic. 90 tablet 1  . sertraline (ZOLOFT) 50 MG tablet Take 50 mg by mouth daily.     No facility-administered medications prior to visit.     ROS: Review of  Systems  Constitutional: Negative for activity change, appetite change, chills, fatigue and unexpected weight change.  HENT: Negative for congestion, mouth sores and sinus pressure.   Eyes: Negative for visual disturbance.  Respiratory: Negative for cough, chest tightness and wheezing.   Cardiovascular: Negative for chest pain, palpitations and leg swelling.  Gastrointestinal: Negative for abdominal pain and nausea.  Genitourinary: Negative for difficulty urinating, frequency and vaginal pain.  Musculoskeletal: Positive for arthralgias and gait problem. Negative for back pain.  Skin: Negative for pallor and rash.  Neurological: Negative for dizziness, tremors, weakness, numbness and headaches.  Psychiatric/Behavioral: Negative for confusion, sleep disturbance and suicidal ideas. The patient is nervous/anxious.   stress at home  Objective:  BP 140/80 (BP Location: Left Arm, Patient Position: Sitting, Cuff Size: Large)   Pulse 71   Ht 5' 4.75" (1.645 m)   Wt 229 lb (103.9 kg)   SpO2 98%   BMI 38.40 kg/m   BP Readings from Last 3 Encounters:  06/18/18 140/80  05/31/18 125/81  05/23/18 126/78    Wt Readings from Last 3 Encounters:  06/18/18 229 lb (103.9 kg)  05/23/18 225 lb (102.1 kg)  05/05/18 210 lb (95.3 kg)    Physical Exam  Constitutional: She appears well-developed. No distress.  HENT:  Head: Normocephalic.  Right Ear: External ear normal.  Left Ear: External ear normal.  Nose: Nose normal.  Mouth/Throat: Oropharynx is clear and moist.  Eyes: Pupils are equal, round, and reactive to light. Conjunctivae are normal. Right eye exhibits no discharge. Left eye exhibits no discharge.  Neck: Normal range of motion. Neck supple. No JVD present. No tracheal deviation present. No thyromegaly present.  Cardiovascular: Normal rate, regular rhythm and normal heart sounds.  Pulmonary/Chest: No stridor. No respiratory distress. She has no wheezes.  Abdominal: Soft. Bowel sounds are  normal. She exhibits no distension and no mass. There is no tenderness. There is no rebound and no guarding.  Musculoskeletal: She exhibits no edema or tenderness.  Lymphadenopathy:    She has no cervical adenopathy.  Neurological: She displays normal reflexes. No cranial nerve deficit. She exhibits normal muscle tone. Coordination normal.  Skin: No rash noted. No erythema.  Psychiatric: She has a normal mood and affect. Her behavior is normal. Judgment and thought content normal.  obese R knee tender  Lab Results  Component Value Date   WBC 8.2 12/10/2017   HGB 13.8 12/10/2017   HCT 40.2 12/10/2017   PLT 191 12/10/2017   GLUCOSE 91 02/28/2018   CHOL 167 05/28/2018   TRIG 69.0 05/28/2018   HDL 98.90 05/28/2018  LDLCALC 54 05/28/2018   ALT 16 02/28/2018   AST 19 02/28/2018   NA 135 02/28/2018   K 4.3 02/28/2018   CL 101 02/28/2018   CREATININE 0.98 02/28/2018   BUN 21 02/28/2018   CO2 27 02/28/2018   TSH 3.16 05/28/2018   INR 1.6 03/05/2016   HGBA1C 5.8 (H) 01/30/2015    Mm 3d Screen Breast Bilateral  Result Date: 06/09/2018 CLINICAL DATA:  Screening. EXAM: DIGITAL SCREENING BILATERAL MAMMOGRAM WITH TOMO AND CAD COMPARISON:  Previous exam(s). ACR Breast Density Category b: There are scattered areas of fibroglandular density. FINDINGS: There are no findings suspicious for malignancy. Images were processed with CAD. IMPRESSION: No mammographic evidence of malignancy. A result letter of this screening mammogram will be mailed directly to the patient. RECOMMENDATION: Screening mammogram in one year. (Code:SM-B-01Y) BI-RADS CATEGORY  1: Negative. Electronically Signed   By: Lajean Manes M.D.   On: 06/09/2018 10:38    Assessment & Plan:   There are no diagnoses linked to this encounter.   No orders of the defined types were placed in this encounter.    Follow-up: No follow-ups on file.  [name], MD

## 2018-06-19 DIAGNOSIS — Z01818 Encounter for other preprocedural examination: Secondary | ICD-10-CM | POA: Insufficient documentation

## 2018-06-19 NOTE — Assessment & Plan Note (Signed)
Wt Readings from Last 3 Encounters:  06/18/18 229 lb (103.9 kg)  05/23/18 225 lb (102.1 kg)  05/05/18 210 lb (95.3 kg)  On diet

## 2018-06-19 NOTE — Assessment & Plan Note (Signed)
Atenolol BP Readings from Last 3 Encounters:  06/18/18 140/80  05/31/18 125/81  05/23/18 126/78

## 2018-06-19 NOTE — Assessment & Plan Note (Signed)
On Crestor 

## 2018-06-19 NOTE — Assessment & Plan Note (Signed)
Stress discussed 

## 2018-06-19 NOTE — Assessment & Plan Note (Signed)
Amanda Wilkins is clear for her R knee surgery assuming her pre-op labs/EKG are normal. She is on Xarelto for A fib - please manage per your protocol. Thank you!

## 2018-06-19 NOTE — Assessment & Plan Note (Signed)
In NSR now Xarelto, Atenolol

## 2018-06-26 DIAGNOSIS — M1711 Unilateral primary osteoarthritis, right knee: Secondary | ICD-10-CM | POA: Diagnosis not present

## 2018-06-26 DIAGNOSIS — G8929 Other chronic pain: Secondary | ICD-10-CM | POA: Diagnosis not present

## 2018-06-26 DIAGNOSIS — M25562 Pain in left knee: Secondary | ICD-10-CM | POA: Diagnosis not present

## 2018-07-01 ENCOUNTER — Telehealth: Payer: Self-pay

## 2018-07-01 DIAGNOSIS — G8929 Other chronic pain: Secondary | ICD-10-CM | POA: Diagnosis not present

## 2018-07-01 DIAGNOSIS — M1711 Unilateral primary osteoarthritis, right knee: Secondary | ICD-10-CM | POA: Diagnosis not present

## 2018-07-01 NOTE — Telephone Encounter (Signed)
   Primary Cardiologist: [name], MD  Chart reviewed as part of pre-operative protocol coverage. Patient was contacted 07/01/2018 in reference to pre-operative risk assessment for pending surgery as outlined below.  Amanda Wilkins was last seen on 03/18/18 by Melina Copa, PA-C.  Phone call was placed to check for cardiac symptoms to decide clearance vs need for additional preop w/u. LVM to call back.   She is on Xarelto for atrial fibrillation, will route to pharmacy for recommendations.   Clearance pending.   Lyda Jester, PA-C 07/01/2018, 2:12 PM

## 2018-07-01 NOTE — Telephone Encounter (Signed)
Patient with diagnosis of atrial fibrillation  on Xarelto for anticoagulation.    Procedure: right total knee arthroplasty Date of procedure: TBD  CHADS2-VASc score of  3 (CHF, HTN, AGE, DM2, stroke/tia x 2, CAD, AGE, female)  CrCl 86.4 Platelet count 191  Per office protocol, patient can hold Xarelto for 3 days prior to procedure.    Patient should restart Xarelto on the evening of procedure or day after, at discretion of procedure MD  For orthopedic procedures please be sure to resume therapeutic (not prophylactic) dosing.

## 2018-07-01 NOTE — Telephone Encounter (Signed)
clearance

## 2018-07-01 NOTE — Telephone Encounter (Signed)
   [facility] Pre-operative Risk Assessment    Request for surgical clearance:  1. What type of surgery is being performed?  Right Total Knee Arthroplasty   2. When is this surgery scheduled?  TBD   3. What type of clearance is required (medical clearance vs. Pharmacy clearance to hold med vs. Both)?  medical  4. Are there any medications that need to be held prior to surgery and how long?    5. Practice name and name of physician performing surgery?  [facility]/  Dr Richardson Landry Lucey   6. What is your office phone number [phone]    7.   What is your office fax number [phone]  8.   Anesthesia type (None, local, MAC, general) ? spinal   Frederik Schmidt 07/01/2018, 12:29 PM  _________________________________________________________________   (provider comments below)

## 2018-07-02 NOTE — Telephone Encounter (Signed)
   Primary Cardiologist: [name], MD  Chart reviewed as part of pre-operative protocol coverage. Patient was contacted 07/02/2018 in reference to pre-operative risk assessment for pending surgery as outlined below.  Amanda Wilkins was last seen on 03/18/18 by Melina Copa, PA.  Since that day, Amanda Wilkins has done done well. Pt contacted by phone. No anginal symptomatology. She is able to ambulate a flight of stairs >4 METs of physical activity w/o exertional CP or dyspnea.   Therefore, based on ACC/AHA guidelines, the patient would be at acceptable risk for the planned procedure without further cardiovascular testing.   Per office protocol, patient can hold Xarelto for 3 days prior to procedure.    Patient should restart Xarelto on the evening of procedure or day after, at discretion of procedure MD  For orthopedic procedures please be sure to resume therapeutic (not prophylactic) dosing.  I will route this recommendation to the requesting party via Epic fax function and remove from pre-op pool.  Please call with questions.  Lyda Jester, PA-C 07/02/2018, 8:52 AM

## 2018-07-04 ENCOUNTER — Telehealth: Payer: Self-pay | Admitting: Cardiology

## 2018-07-04 NOTE — Telephone Encounter (Signed)
[place]

## 2018-07-16 ENCOUNTER — Ambulatory Visit (INDEPENDENT_AMBULATORY_CARE_PROVIDER_SITE_OTHER): Payer: Medicare Other

## 2018-07-16 DIAGNOSIS — Z23 Encounter for immunization: Secondary | ICD-10-CM | POA: Diagnosis not present

## 2018-07-21 ENCOUNTER — Other Ambulatory Visit: Payer: Self-pay | Admitting: Orthopedic Surgery

## 2018-07-22 ENCOUNTER — Encounter: Payer: Self-pay | Admitting: Obstetrics & Gynecology

## 2018-07-22 ENCOUNTER — Ambulatory Visit (INDEPENDENT_AMBULATORY_CARE_PROVIDER_SITE_OTHER): Payer: Medicare Other | Admitting: Obstetrics & Gynecology

## 2018-07-22 VITALS — BP 128/84 | Wt 230.0 lb

## 2018-07-22 DIAGNOSIS — N951 Menopausal and female climacteric states: Secondary | ICD-10-CM | POA: Diagnosis not present

## 2018-07-22 NOTE — Progress Notes (Signed)
    Amanda Wilkins [birth year] [id number]        72 y.o.  G2P2002 Married  RP: Menopausal vasomotor symptoms   HPI: Stopped HRT 04/2018 because of 72 yo, >10 yrs of use, heavy PMB.  After stopping HRT, [place] Endometrial line thin at 4.2 mm.  No recurrence of PMB.  C/O hot flushes about twice a day and night sweats early when goes to bed, once falls asleep, not awakened.  Stable on Zoloft 50 mg daily.   OB History  Gravida Para Term Preterm AB Living  2 2 2     2   SAB TAB Ectopic Multiple Live Births          2    # Outcome Date GA Lbr Len/2nd Weight Sex Delivery Anes PTL Lv  2 Term     M Vag-Spont  N LIV  1 Term     M CS-Unspec  N LIV    Past medical history,surgical history, problem list, medications, allergies, family history and social history were all reviewed and documented in the EPIC chart.   Directed ROS with pertinent positives and negatives documented in the history of present illness/assessment and plan.  Exam:  Vitals:   07/22/18 1140  BP: 128/84  Weight: 230 lb (104.3 kg)   General appearance:  Normal   Assessment/Plan:  72 y.o. G2P2002   1. Menopausal syndrome Continued vasomotor symptoms since discontinued hormone replacement therapy in July 2019.  Discussed with patient that the risks of hormone replacement therapy outweigh the benefits at this time.  Decision to stay off hormone replacement therapy but to increase Zoloft to 100 mg daily in the hope of decreasing the vasomotor menopausal symptoms.  Patient will also increase Soy in nutrition.  Counseling on above issues and coordination of care more than 50% for 25 minutes.  Princess Bruins MD, 11:53 AM 07/22/2018

## 2018-07-27 ENCOUNTER — Encounter: Payer: Self-pay | Admitting: Obstetrics & Gynecology

## 2018-07-27 NOTE — Patient Instructions (Signed)
1. Menopausal syndrome Continued vasomotor symptoms since discontinued hormone replacement therapy in July 2019.  Discussed with patient that the risks of hormone replacement therapy outweigh the benefits at this time.  Decision to stay off hormone replacement therapy but to increase Zoloft to 100 mg daily in the hope of decreasing the vasomotor menopausal symptoms.  Patient will also increase Soy in nutrition.  Amanda Wilkins, it was a pleasure seeing you today!

## 2018-08-18 ENCOUNTER — Other Ambulatory Visit: Payer: Self-pay | Admitting: Orthopedic Surgery

## 2018-08-22 NOTE — Patient Instructions (Addendum)
Amanda Wilkins  11-11-1945    Your procedure is scheduled on:  09-01-2018    Report to [facility] Main  Entrance,  Report to admitting at  5:30 AM     Call this number if you have problems the morning of surgery [phone]       Remember: Do not eat food or drink liquids :After Midnight.                                     BRUSH YOUR TEETH MORNING OF SURGERY AND RINSE YOUR MOUTH OUT, NO CHEWING GUM CANDY OR MINTS.       Take these medicines the morning of surgery with A SIP OF WATER:  Sertraline (zoloft), Rosuvastatin (crestor), Plaquenil, Atenolol                                 You may not have any metal on your body including hair pins and piercings              Do not wear jewelry, make-up, lotions, powders or perfumes, deodorant              Do not wear nail polish.  Do not shave  48 hours prior to surgery.       Do not bring valuables to the hospital. [place].  Contacts, dentures or bridgework may not be worn into surgery.  Leave suitcase in the car. After surgery it may be brought to your room.     Special Instructions:   BRING CPAP MASK AND TUBING WITH YOU DAY OF SURGERY          _____________________________________________________________________             [facility] - Preparing for Surgery Before surgery, you can play an important role.  Because skin is not sterile, your skin needs to be as free of germs as possible.  You can reduce the number of germs on your skin by washing with CHG (chlorahexidine gluconate) soap before surgery.  CHG is an antiseptic cleaner which kills germs and bonds with the skin to continue killing germs even after washing. Please DO NOT use if you have an allergy to CHG or antibacterial soaps.  If your skin becomes reddened/irritated stop using the CHG and inform your nurse when you arrive at Short Stay. Do not shave (including legs and  underarms) for at least 48 hours prior to the first CHG shower.  You may shave your face/neck. Please follow these instructions carefully:  1.  Shower with CHG Soap the night before surgery and the  morning of Surgery.  2.  If you choose to wash your hair, wash your hair first as usual with your  normal  shampoo.  3.  After you shampoo, rinse your hair and body thoroughly to remove the  shampoo.                            4.  Use CHG as you would any other liquid soap.  You can apply chg directly  to the skin and wash  Gently with a scrungie or clean washcloth.  5.  Apply the CHG Soap to your body ONLY FROM THE NECK DOWN.   Do not use on face/ open                           Wound or open sores. Avoid contact with eyes, ears mouth and genitals (private parts).                       Wash face,  Genitals (private parts) with your normal soap.             6.  Wash thoroughly, paying special attention to the area where your surgery  will be performed.  7.  Thoroughly rinse your body with warm water from the neck down.  8.  DO NOT shower/wash with your normal soap after using and rinsing off  the CHG Soap.             9.  Pat yourself dry with a clean towel.            10.  Wear clean pajamas.            11.  Place clean sheets on your bed the night of your first shower and do not  sleep with pets. Day of Surgery : Do not apply any lotions/deodorants the morning of surgery.  Please wear clean clothes to the hospital/surgery center.  FAILURE TO FOLLOW THESE INSTRUCTIONS MAY RESULT IN THE CANCELLATION OF YOUR SURGERY PATIENT SIGNATURE_________________________________  NURSE SIGNATURE__________________________________  ________________________________________________________________________   Amanda Wilkins  An incentive spirometer is a tool that can help keep your lungs clear and active. This tool measures how well you are filling your lungs with each breath. Taking  long deep breaths may help reverse or decrease the chance of developing breathing (pulmonary) problems (especially infection) following:  A long period of time when you are unable to move or be active. BEFORE THE PROCEDURE   If the spirometer includes an indicator to show your best effort, your nurse or respiratory therapist will set it to a desired goal.  If possible, sit up straight or lean slightly forward. Try not to slouch.  Hold the incentive spirometer in an upright position. INSTRUCTIONS FOR USE  1. Sit on the edge of your bed if possible, or sit up as far as you can in bed or on a chair. 2. Hold the incentive spirometer in an upright position. 3. Breathe out normally. 4. Place the mouthpiece in your mouth and seal your lips tightly around it. 5. Breathe in slowly and as deeply as possible, raising the piston or the ball toward the top of the column. 6. Hold your breath for 3-5 seconds or for as long as possible. Allow the piston or ball to fall to the bottom of the column. 7. Remove the mouthpiece from your mouth and breathe out normally. 8. Rest for a few seconds and repeat Steps 1 through 7 at least 10 times every 1-2 hours when you are awake. Take your time and take a few normal breaths between deep breaths. 9. The spirometer may include an indicator to show your best effort. Use the indicator as a goal to work toward during each repetition. 10. After each set of 10 deep breaths, practice coughing to be sure your lungs are clear. If you have an incision (the cut made at the time of surgery), support your incision  when coughing by placing a pillow or rolled up towels firmly against it. Once you are able to get out of bed, walk around indoors and cough well. You may stop using the incentive spirometer when instructed by your caregiver.  RISKS AND COMPLICATIONS  Take your time so you do not get dizzy or light-headed.  If you are in pain, you may need to take or ask for pain  medication before doing incentive spirometry. It is harder to take a deep breath if you are having pain. AFTER USE  Rest and breathe slowly and easily.  It can be helpful to keep track of a log of your progress. Your caregiver can provide you with a simple table to help with this. If you are using the spirometer at home, follow these instructions: [place] IF:   You are having difficultly using the spirometer.  You have trouble using the spirometer as often as instructed.  Your pain medication is not giving enough relief while using the spirometer.  You develop fever of 100.5 F (38.1 C) or higher. SEEK IMMEDIATE MEDICAL CARE IF:   You cough up bloody sputum that had not been present before.  You develop fever of 102 F (38.9 C) or greater.  You develop worsening pain at or near the incision site. MAKE SURE YOU:   Understand these instructions.  Will watch your condition.  Will get help right away if you are not doing well or get worse. Document Released: 02/11/2007 Document Revised: 12/24/2011 Document Reviewed: 04/14/2007 Exit[employer] Patient Information 2014 [employer], [place].   ________________________________________________________________________  WHAT IS A BLOOD TRANSFUSION? Blood Transfusion Information  A transfusion is the replacement of blood or some of its parts. Blood is made up of multiple cells which provide different functions.  Red blood cells carry oxygen and are used for blood loss replacement.  White blood cells fight against infection.  Platelets control bleeding.  Plasma helps clot blood.  Other blood products are available for specialized needs, such as hemophilia or other clotting disorders. BEFORE THE TRANSFUSION  Who gives blood for transfusions?   Healthy volunteers who are fully evaluated to make sure their blood is safe. This is blood bank blood. Transfusion therapy is the safest it has ever been in the practice of medicine.  Before blood is taken from a donor, a complete history is taken to make sure that person has no history of diseases nor engages in risky social behavior (examples are intravenous drug use or sexual activity with multiple partners). The donor's travel history is screened to minimize risk of transmitting infections, such as malaria. The donated blood is tested for signs of infectious diseases, such as HIV and hepatitis. The blood is then tested to be sure it is compatible with you in order to minimize the chance of a transfusion reaction. If you or a relative donates blood, this is often done in anticipation of surgery and is not appropriate for emergency situations. It takes many days to process the donated blood. RISKS AND COMPLICATIONS Although transfusion therapy is very safe and saves many lives, the main dangers of transfusion include:   Getting an infectious disease.  Developing a transfusion reaction. This is an allergic reaction to something in the blood you were given. Every precaution is taken to prevent this. The decision to have a blood transfusion has been considered carefully by your caregiver before blood is given. Blood is not given unless the benefits outweigh the risks. AFTER THE TRANSFUSION  Right after  receiving a blood transfusion, you will usually feel much better and more energetic. This is especially true if your red blood cells have gotten low (anemic). The transfusion raises the level of the red blood cells which carry oxygen, and this usually causes an energy increase.  The nurse administering the transfusion will monitor you carefully for complications. HOME CARE INSTRUCTIONS  No special instructions are needed after a transfusion. You may find your energy is better. Speak with your caregiver about any limitations on activity for underlying diseases you may have. SEEK MEDICAL CARE IF:   Your condition is not improving after your transfusion.  You develop redness or  irritation at the intravenous (IV) site. SEEK IMMEDIATE MEDICAL CARE IF:  Any of the following symptoms occur over the next 12 hours:  Shaking chills.  You have a temperature by mouth above 102 F (38.9 C), not controlled by medicine.  Chest, back, or muscle pain.  People around you feel you are not acting correctly or are confused.  Shortness of breath or difficulty breathing.  Dizziness and fainting.  You get a rash or develop hives.  You have a decrease in urine output.  Your urine turns a dark color or changes to pink, red, or brown. Any of the following symptoms occur over the next 10 days:  You have a temperature by mouth above 102 F (38.9 C), not controlled by medicine.  Shortness of breath.  Weakness after normal activity.  The white part of the eye turns yellow (jaundice).  You have a decrease in the amount of urine or are urinating less often.  Your urine turns a dark color or changes to pink, red, or brown. Document Released: 09/28/2000 Document Revised: 12/24/2011 Document Reviewed: 05/17/2008 [facility] Patient Information 2014 [place], [place].  _______________________________________________________________________

## 2018-08-26 ENCOUNTER — Encounter (HOSPITAL_COMMUNITY)
Admission: RE | Admit: 2018-08-26 | Discharge: 2018-08-26 | Disposition: A | Discharge status: Home / Self Care | Payer: Medicare Other | Source: Ambulatory Visit | Attending: Orthopedic Surgery | Admitting: Orthopedic Surgery

## 2018-08-26 ENCOUNTER — Encounter (HOSPITAL_COMMUNITY): Payer: Self-pay

## 2018-08-26 ENCOUNTER — Other Ambulatory Visit: Payer: Self-pay

## 2018-08-26 DIAGNOSIS — Z01812 Encounter for preprocedural laboratory examination: Secondary | ICD-10-CM | POA: Insufficient documentation

## 2018-08-26 DIAGNOSIS — M1711 Unilateral primary osteoarthritis, right knee: Secondary | ICD-10-CM | POA: Insufficient documentation

## 2018-08-26 HISTORY — DX: Personal history of other diseases of urinary system: Z87.448

## 2018-08-26 HISTORY — DX: Dependence on other enabling machines and devices: Z99.89

## 2018-08-26 HISTORY — DX: Long term (current) use of anticoagulants: Z79.01

## 2018-08-26 HISTORY — DX: Disorder of arteries and arterioles, unspecified: I77.9

## 2018-08-26 HISTORY — DX: Peripheral vascular disease, unspecified: I73.9

## 2018-08-26 HISTORY — DX: Personal history of other medical treatment: Z92.89

## 2018-08-26 HISTORY — DX: Presence of spectacles and contact lenses: Z97.3

## 2018-08-26 HISTORY — DX: Unspecified osteoarthritis, unspecified site: M19.90

## 2018-08-26 HISTORY — DX: Obstructive sleep apnea (adult) (pediatric): G47.33

## 2018-08-26 HISTORY — DX: Personal history of other infectious and parasitic diseases: Z86.19

## 2018-08-26 LAB — CBC WITH DIFFERENTIAL/PLATELET
Abs Immature Granulocytes: 0.01 10*3/uL (ref 0.00–0.07)
BASOS ABS: 0 10*3/uL (ref 0.0–0.1)
BASOS PCT: 1 %
EOS ABS: 0.1 10*3/uL (ref 0.0–0.5)
Eosinophils Relative: 2 %
HCT: 37.6 % (ref 36.0–46.0)
Hemoglobin: 12.4 g/dL (ref 12.0–15.0)
Immature Granulocytes: 0 %
Lymphocytes Relative: 32 %
Lymphs Abs: 1.8 10*3/uL (ref 0.7–4.0)
MCH: 34 pg (ref 26.0–34.0)
MCHC: 33 g/dL (ref 30.0–36.0)
MCV: 103 fL — ABNORMAL HIGH (ref 80.0–100.0)
Monocytes Absolute: 0.4 10*3/uL (ref 0.1–1.0)
Monocytes Relative: 8 %
NEUTROS ABS: 3.1 10*3/uL (ref 1.7–7.7)
NEUTROS PCT: 57 %
NRBC: 0 % (ref 0.0–0.2)
PLATELETS: 183 10*3/uL (ref 150–400)
RBC: 3.65 MIL/uL — AB (ref 3.87–5.11)
RDW: 12.7 % (ref 11.5–15.5)
WBC: 5.6 10*3/uL (ref 4.0–10.5)

## 2018-08-26 LAB — SURGICAL PCR SCREEN
MRSA, PCR: NEGATIVE
STAPHYLOCOCCUS AUREUS: NEGATIVE

## 2018-08-26 LAB — COMPREHENSIVE METABOLIC PANEL
ALK PHOS: 60 U/L (ref 38–126)
ALT: 22 U/L (ref 0–44)
ANION GAP: 8 (ref 5–15)
AST: 26 U/L (ref 15–41)
Albumin: 4.6 g/dL (ref 3.5–5.0)
BILIRUBIN TOTAL: 0.7 mg/dL (ref 0.3–1.2)
BUN: 19 mg/dL (ref 8–23)
CALCIUM: 9.7 mg/dL (ref 8.9–10.3)
CO2: 26 mmol/L (ref 22–32)
Chloride: 100 mmol/L (ref 98–111)
Creatinine, Ser: 0.73 mg/dL (ref 0.44–1.00)
GFR calc Af Amer: >60 mL/min (ref >60)
Glucose, Bld: 106 mg/dL — ABNORMAL HIGH (ref 70–99)
POTASSIUM: 5.1 mmol/L (ref 3.5–5.1)
Sodium: 134 mmol/L — ABNORMAL LOW (ref 135–145)
TOTAL PROTEIN: 7.4 g/dL (ref 6.5–8.1)

## 2018-08-26 MED ORDER — CHLORHEXIDINE GLUCONATE 4 % EX LIQD
60.0000 mL | Freq: Once | CUTANEOUS | Status: DC
  Filled 2018-08-26: qty 60

## 2018-08-26 NOTE — Progress Notes (Signed)
EKG dated 12-11-2017 in epic. Cardiologist, dr [name], clearance/lov note dated 07-01-2018 in epic. Telephone clearance 07-02-2018 from dr dr [name] in chart and epic.

## 2018-08-27 ENCOUNTER — Telehealth: Payer: Self-pay | Admitting: Internal Medicine

## 2018-08-27 ENCOUNTER — Ambulatory Visit: Payer: Self-pay | Admitting: *Deleted

## 2018-08-27 NOTE — Telephone Encounter (Signed)
Pt c/o BP issue: STAT if pt c/o blurred vision, one-sided weakness or slurred speech  1. What are your last 5 BP readings?  158/83 this morning  2. Are you having any other symptoms (ex. Dizziness, headache, blurred vision, passed out)? no  3. What is your BP issue?  Patient is having knee replacement done on Monday, she woke up this morning with her heart racing, states it felt it was doing "flip-flops"  "She is wondering if she needs to come in to be checked out."

## 2018-08-27 NOTE — Telephone Encounter (Signed)
Woke up around 4:30 am. Felt heart flip flopping.  No SOB.  No chest pain.  BP is usually lower, recheck this am was 140/72.  HR was 70s but irregular feeling has stopped.  Feels strange, wiped out.  Took AM atenolol early.  Has knee surgery scheduled 09/01/18.  Asked about taking a nighttime dose of atenolol as well.   Adv to discuss at appointment but may just be able to change the dose from AM to PM.  Pre op testing yesterday.  No EKG at that time.  Scheduled patient with APP tomorrow at [place] office. Pt aware of location.  She is appreciative for being able to be "checked out" prior to upcoming surgery.

## 2018-08-27 NOTE — Telephone Encounter (Signed)
Pt reports "Frequent palpitations at 0430 this am." States called cardiologist, appt scheduled for tomorrow at 1330.  Calling this evening to report concern BP 168/89. States took 20 minutes prior to call. States palpitations are "much less" than this AM. Occasional when lying down. Does not worsen with exertion. Denies any CP, SOB, dizziness. During call BP 136/96, HR 80, 5 minutes later BP 133/74, HR 71.  Pt reports h/o a-fib. States "This doesn't feel anything like that." States  most concerned about BP. Instructed pt to keep appt tomorrow with cardiologist; to go to ED if palpitations worsen in frequency, duration, any CP, SOB, dizziness. Pt verbalizes understanding.  Reason for Disposition . [1] Palpitations AND [2] no improvement after using CARE ADVICE  Answer Assessment - Initial Assessment Questions 1. DESCRIPTION: "Please describe your heart rate or heart beat that you are having" (e.g., fast/slow, regular/irregular, skipped or extra beats, "palpitations")    "Skipping at times, not much now." Occasional beat 2. ONSET: "When did it start?" (Minutes, hours or days)      This am 0430 3. DURATION: "How long does it last" (e.g., seconds, minutes, hours)     One second. Random pattern 4. PATTERN "Does it come and go, or has it been constant since it started?"  "Does it get worse with exertion?"   "Are you feeling it now?"     Unsure 5. TAP: "Using your hand, can you tap out what you are feeling on a chair or table in front of you, so that I can hear?" (Note: not all patients can do this)        6. HEART RATE: "Can you tell me your heart rate?" "How many beats in 15 seconds?"  (Note: not all patients can do this)       80 7. RECURRENT SYMPTOM: "Have you ever had this before?" If so, ask: "When was the last time?" and "What happened that time?"      Yes 2009, a-fib, cardioversion 8. CAUSE: "What do you think is causing the palpitations?"     unsure 9. CARDIAC HISTORY: "Do you have any  history of heart disease?" (e.g., heart attack, angina, bypass surgery, angioplasty, arrhythmia)      A-fib 10. OTHER SYMPTOMS: "Do you have any other symptoms?" (e.g., dizziness, chest pain, sweating, difficulty breathing)      No, fatigued, "Washed out."  BP 136/96 during call 5 minutes later 133/74 HR 71  Protocols used: HEART RATE AND HEARTBEAT QUESTIONS-A-AH

## 2018-08-28 ENCOUNTER — Encounter: Payer: Self-pay | Admitting: Physician Assistant

## 2018-08-28 ENCOUNTER — Ambulatory Visit (INDEPENDENT_AMBULATORY_CARE_PROVIDER_SITE_OTHER): Payer: Medicare Other | Admitting: Physician Assistant

## 2018-08-28 VITALS — BP 135/74 | HR 69 | Ht 65.0 in | Wt 227.0 lb

## 2018-08-28 DIAGNOSIS — Z9989 Dependence on other enabling machines and devices: Secondary | ICD-10-CM | POA: Diagnosis not present

## 2018-08-28 DIAGNOSIS — G4733 Obstructive sleep apnea (adult) (pediatric): Secondary | ICD-10-CM | POA: Diagnosis not present

## 2018-08-28 DIAGNOSIS — R9439 Abnormal result of other cardiovascular function study: Secondary | ICD-10-CM

## 2018-08-28 DIAGNOSIS — I480 Paroxysmal atrial fibrillation: Secondary | ICD-10-CM | POA: Diagnosis not present

## 2018-08-28 DIAGNOSIS — Z01818 Encounter for other preprocedural examination: Secondary | ICD-10-CM | POA: Diagnosis not present

## 2018-08-28 NOTE — Progress Notes (Addendum)
Cardiology Office Note    Date:  08/30/2018   ID:  Amanda Wilkins, DOB [birth year], MRN [id number]  PCP:  Amanda Anger, MD  Cardiologist:  Dr. [name] CPAP management: Dr. Radford Pax  Chief Complaint  Patient presents with  . Follow-up    seen for Dr. [name].    History of Present Illness:  Amanda Wilkins is a 72 y.o. female with paroxysmal atrial fibrillation on Xarelto, PSVT, abnormal stress test in 2016, chronic appearing hyponatremia, depression, chronic interstitial nephritis, obesity and obstructive sleep apnea.  In 2016, she had abnormal stress test, Dr. [name] recommended a cardiac CT however this was declined by insurance.  Before further testing could be discussed, she was admitted to the hospital in April 2016 with recurrent persistent atrial fibrillation in the setting of sepsis, recent palace, acute renal failure, and heme positive stool with unremarkable EGD.  She was on Coumadin at the time.  Echocardiogram showed EF 55 to 60%, no regional wall motion abnormality, mild MR, PASP 32 mmHg.  Holter monitor obtained after the admission showed paroxysmal atrial tachycardia but no recurrent atrial fibrillation.  She has since been switched to Xarelto which she is tolerating without adverse event.  Dr. [name] mentioned that she had reviewed the previous CT angiogram that although that was not intended for coronary artery, did not reveal any coronary calcification.  There was mild atherosclerosis in the aorta.  She was not convinced of active ischemia or significant CAD, therefore it was recommended for her to continue surveillance.  In 2018, she had a another event monitor that showed PAF and SVT.  Flecainide was suggested but patient declined due to side effect.  She had new onset atrial flutter in February 2019, she is spontaneously converted to sinus rhythm.  Her sleep study obtained in April 2019 was also positive for obstructive sleep apnea.  She was referred to Dr. Radford Pax for  management.  Patient woke up around 5 AM yesterday having feeling of her heart turned over.  This is similar to what she has experienced previously with atrial fibrillation.  This lasted a entire day.  She did take atenolol early.  The palpitation has resolved by this point.  EKG also shows she is in sinus rhythm.  She denies any chest pain or shortness of breath.  I instructed the patient to take as needed dose of extra atenolol for recurrent atrial fibrillation.  She has a long-standing history where she is able to self convert her out of A. fib using rate control therapy only.  I do not think she necessarily need antiarrhythmic therapy at this time.  Because she has both A. fib and atrial flutter, it would be difficult for her to have ablation procedure.  Given the rarity of her symptom, rate control therapy is good enough at this time.   Past Medical History:  Diagnosis Date  . Anticoagulant long-term use    Xarelto  . Chronic interstitial nephritis   . Depression   . History of interstitial nephritis 2016   chronic  . History of nuclear stress test 12/16/2014   Intermediate risk nuclear study w/ medium size moderate severity reversible defect in the basal and mid inferolateral and inferior wall (per dr cardiology note , dr [name] did not think this was consistent with ischemia)/  normal LV function and wall motion, ef 75%  . History of septic shock 01/22/2015   in setting Group A Strep Cellulits erysipelas/chest wall induration with streptoccocus basterium-- Severe sepsis, DIC,  Acute respiratory failure with pulmonary edema, Acute Kidney failure with chronic interstitial nephritis  . Hypertension   . Hyponatremia   . Migraines    on Zoloft for migraines  . Mild carotid artery disease ([place])    per duplex 08-30-2017 bilateral ICA 1-39%  . OA (osteoarthritis) rheumotologist-  dr [name]   both knees,  shoulders, ankles  . OSA on CPAP     moderate obstructive sleep apnea with an  AHI of 23.4/h and oxygen desaturations as low as 84%.  Now on CPAP at 12 cm H2O.  [name] PAF (paroxysmal atrial fibrillation) [facility]) 2009   cardiologist-- dr [name]  . Paroxysmal atrial flutter ([place])    a. dx 11/2017.  [name] PSVT (paroxysmal supraventricular tachycardia) ([place])   . Wears contact lenses     Past Surgical History:  Procedure Laterality Date  . CESAREAN SECTION  1980  . ESOPHAGOGASTRODUODENOSCOPY N/A 02/08/2015   Procedure: ESOPHAGOGASTRODUODENOSCOPY (EGD);  Surgeon: [name], MD;  Location: [place];  Service: Endoscopy;  Laterality: N/A;  . KNEE ARTHROSCOPY W/ MENISCAL REPAIR Left 08/2014    @[facility]   . SHOULDER SURGERY Right 04/04/2016    Current Medications: Outpatient Medications Prior to Visit  Medication Sig Dispense Refill  . acetaminophen (TYLENOL) 500 MG tablet Take 1,000 mg by mouth every 8 (eight) hours as needed (pain).    [name] atenolol (TENORMIN) 50 MG tablet TAKE 1 TABLET BY MOUTH EVERY DAY (Patient taking differently: Take 50 mg by mouth every morning. ) 30 tablet 11  . cholecalciferol (VITAMIN D3) 25 MCG (1000 UT) tablet Take 1,000 Units by mouth daily.    . hydroxychloroquine (PLAQUENIL) 200 MG tablet Take 200 mg by mouth every morning.     . Multiple Vitamin (MULTIVITAMIN) capsule Take 1 capsule by mouth daily.    . rivaroxaban (XARELTO) 20 MG TABS tablet Take 1 tablet (20 mg total) by mouth daily with supper. 90 tablet 3  . rosuvastatin (CRESTOR) 5 MG tablet Take 1 tablet (5 mg total) by mouth daily. Please dispense generic. (Patient taking differently: Take 5 mg by mouth every morning. Please dispense generic.) 90 tablet 1  . sertraline (ZOLOFT) 50 MG tablet Take 50 mg by mouth every morning.      No facility-administered medications prior to visit.      Allergies:   Epinephrine base; Aspirin; Penicillins; Synvisc [hylan g-f 20]; and Tape   Social History   Socioeconomic History  . Marital status: Married    Spouse name: Not on file  . Number of  children: 2  . Years of education: Not on file  . Highest education level: Not on file  Occupational History  . Occupation: retired    Fish farm manager: UNEMPLOYED  . 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 Financial resource strain: Not on file  . Food insecurity:    Worry: Not on file    Inability: Not on file  . Transportation needs:    Medical: Not on file    Non-medical: Not on file  Tobacco Use  . Smoking status: Never Smoker  . Smokeless tobacco: Never Used  . Tobacco comment: H/O social smoking at times when drinking--never a "habit" (in the 1970s)  Substance and Sexual Activity  . Alcohol use: Yes    Alcohol/week: 0.0 standard drinks    Comment: Occasional  . Drug use: Never  . Sexual activity: Yes    Partners: Male    Birth control/protection: Post-menopausal    Comment: intercourse  age 5, sexual partners more than 5  Lifestyle  . Physical activity:    Days per week: Not on file    Minutes per session: Not on file  . Stress: Not on file  Relationships  . Social connections:    Talks on phone: Not on file    Gets together: Not on file    Attends religious service: Not on file    Active member of club or organization: Not on file    Attends meetings of clubs or organizations: Not on file    Relationship status: Not on file  Other Topics Concern  . Not on file  Social History Narrative  . Not on file     Family History:  The patient's family history includes Breast cancer (age of onset: 25) in her sister; Lymphoma in her mother; Pancreatic cancer in her brother; Prostate cancer in her father.   ROS:   Please see the history of present illness.    ROS All other systems reviewed and are negative.   PHYSICAL EXAM:   VS:  Pulse 69   Ht 5\' 5"  (1.651 m)   Wt 227 lb (103 kg)   BMI 37.77 kg/m    GEN: Well nourished, well developed, in no acute distress  HEENT: normal  Neck: no JVD, carotid bruits, or masses Cardiac: RRR; no murmurs, rubs, or  gallops,no edema  Respiratory:  clear to auscultation bilaterally, normal work of breathing GI: soft, nontender, nondistended, + BS MS: no deformity or atrophy  Skin: warm and dry, no rash Neuro:  Alert and Oriented x 3, Strength and sensation are intact Psych: euthymic mood, full affect  Wt Readings from Last 3 Encounters:  08/28/18 227 lb (103 kg)  08/26/18 228 lb 6 oz (103.6 kg)  07/22/18 230 lb (104.3 kg)      Studies/Labs Reviewed:   EKG:  EKG is ordered today.  The ekg ordered today demonstrates NSR without significant ST-T wave changes  Recent Labs: 05/28/2018: TSH 3.16 08/26/2018: ALT 22; BUN 19; Creatinine, Ser 0.73; Hemoglobin 12.4; Platelets 183; Potassium 5.1; Sodium 134   Lipid Panel    Component Value Date/Time   CHOL 167 05/28/2018 0837   TRIG 69.0 05/28/2018 0837   HDL 98.90 05/28/2018 0837   CHOLHDL 2 05/28/2018 0837   VLDL 13.8 05/28/2018 0837   LDLCALC 54 05/28/2018 0837    Additional studies/ records that were reviewed today include:   Echo 01/25/2015 LV EF: 55% -  60% Study Conclusions  - Left ventricle: The cavity size was normal. Wall thickness was normal. Systolic function was normal. The estimated ejection fraction was in the range of 55% to 60%. Wall motion was normal; there were no regional wall motion abnormalities. - Mitral valve: Calcified annulus. There was mild regurgitation. - Left atrium: The atrium was mildly dilated. - Pulmonary arteries: Systolic pressure was mildly increased. PA peak pressure: 32 mm Hg (S).  Impressions:  - Normal LV function; mild LAE; mild MR and TR; mildly elevated pulmonary pressure.    ASSESSMENT:    1. PAF (paroxysmal atrial fibrillation) ([place])   2. Abnormal stress test   3. OSA on CPAP   4. Preoperative clearance      PLAN:  In order of problems listed above:  1. Paroxysmal atrial fibrillation: Continue Xarelto.  Patient has been instructed to take extra dose of atenolol on a as  needed basis for breakthrough atrial fibrillation  2. Preoperative clearance: Patient has been instructed to hold Xarelto  for 3 days prior to the procedure and restart Xarelto as soon as possible after the surgery.  She was already cleared previously and given lack of chest pain or shortness of breath, no further cardiac work-up is necessary prior to the surgery.  3. Obstructive sleep apnea on CPAP: Managed by Dr. Radford Pax  4. Abnormal stress test: She has a remote history of abnormal stress test, however she never truly presented with symptoms of angina.  She is able to complete at least 4 METS of activity without any chest pain or shortness of breath    Medication Adjustments/Labs and Tests Ordered: Current medicines are reviewed at length with the patient today.  Concerns regarding medicines are outlined above.  Medication changes, Labs and Tests ordered today are listed in the Patient Instructions below. Patient Instructions  Medication Instructions:  Continue Xarelto HOLD Xarelto 3 days prior to your surgery  MAY Take 1 extra tablet of Atenolol as needed for break through Afib  If you need a refill on your cardiac medications before your next appointment, please call your pharmacy.   Lab work: None  If you have labs (blood work) drawn today and your tests are completely normal, you will receive your results only by: [name] MyChart Message (if you have MyChart) OR . A paper copy in the mail If you have any lab test that is abnormal or we need to change your treatment, we will call you to review the results.  Testing/Procedures: None   Follow-Up: At [facility], you and your health needs are our priority.  As part of our continuing mission to provide you with exceptional heart care, we have created designated Provider Care Teams.  These Care Teams include your primary Cardiologist (physician) and Advanced Practice Providers (APPs -  Physician Assistants and Nurse Practitioners) who all  work together to provide you with the care you need, when you need it. . Your physician recommends that you schedule a follow-up appointment in: 3-4 months with Dr [name].  Any Other Special Instructions Will Be Listed Below (If Applicable).  We will resend a clearance letter to your Knee surgeon.     Hilbert Corrigan, [place]  08/30/2018 11:45 PM    [address], [place], [place]  [address] Phone: [phone]; Fax: [phone]

## 2018-08-28 NOTE — Telephone Encounter (Signed)
Noted. Thx.

## 2018-08-28 NOTE — Patient Instructions (Addendum)
Medication Instructions:  Continue Xarelto HOLD Xarelto 3 days prior to your surgery  MAY Take 1 extra tablet of Atenolol as needed for break through Afib  If you need a refill on your cardiac medications before your next appointment, please call your pharmacy.   Lab work: None  If you have labs (blood work) drawn today and your tests are completely normal, you will receive your results only by: [name] MyChart Message (if you have MyChart) OR . A paper copy in the mail If you have any lab test that is abnormal or we need to change your treatment, we will call you to review the results.  Testing/Procedures: None   Follow-Up: At [facility], you and your health needs are our priority.  As part of our continuing mission to provide you with exceptional heart care, we have created designated Provider Care Teams.  These Care Teams include your primary Cardiologist (physician) and Advanced Practice Providers (APPs -  Physician Assistants and Nurse Practitioners) who all work together to provide you with the care you need, when you need it. . Your physician recommends that you schedule a follow-up appointment in: 3-4 months with Dr [name].  Any Other Special Instructions Will Be Listed Below (If Applicable).  We will resend a clearance letter to your Knee surgeon.

## 2018-08-28 NOTE — Telephone Encounter (Signed)
FYI, pt seeing Cardiology today

## 2018-08-30 ENCOUNTER — Encounter: Payer: Self-pay | Admitting: Physician Assistant

## 2018-08-31 NOTE — Anesthesia Preprocedure Evaluation (Addendum)
Anesthesia Evaluation  Patient identified by MRN, date of birth, ID band Patient awake    Reviewed: Allergy & Precautions, H&P , NPO status , Patient's Chart, lab work & pertinent test results, reviewed documented beta blocker date and time   Airway Mallampati: I  TM Distance: >3 FB Neck ROM: full    Dental no notable dental hx.    Pulmonary neg pulmonary ROS, sleep apnea and Continuous Positive Airway Pressure Ventilation ,    Pulmonary exam normal breath sounds clear to auscultation       Cardiovascular Exercise Tolerance: Good hypertension, Pt. on medications negative cardio ROS  + dysrhythmias Atrial Fibrillation  Rhythm:regular Rate:Normal  ECHO 2016 The cavity size was normal. Wall thickness was normal. Systolic function was normal. The estimated ejection fraction was in the range of 55% to 60%. Wall motion was normal;   Neuro/Psych  Headaches, PSYCHIATRIC DISORDERS Depression  Neuromuscular disease negative neurological ROS  negative psych ROS   GI/Hepatic negative GI ROS, Neg liver ROS,   Endo/Other  negative endocrine ROS  Renal/GU Renal diseasenegative Renal ROS  negative genitourinary   Musculoskeletal  (+) Arthritis , Osteoarthritis and Rheumatoid disorders,    Abdominal   Peds  Hematology negative hematology ROS (+) Blood dyscrasia, anemia ,   Anesthesia Other Findings   Reproductive/Obstetrics negative OB ROS                           Anesthesia Physical Anesthesia Plan  ASA: III  Anesthesia Plan: General   Post-op Pain Management:  Regional for Post-op pain   Induction:   PONV Risk Score and Plan: 3 and Treatment may vary due to age or medical condition and Ondansetron  Airway Management Planned: Nasal Cannula and Natural Airway  Additional Equipment:   Intra-op Plan:   Post-operative Plan:   Informed Consent: I have reviewed the patients History and  Physical, chart, labs and discussed the procedure including the risks, benefits and alternatives for the proposed anesthesia with the patient or authorized representative who has indicated his/her understanding and acceptance.   Dental Advisory Given  Plan Discussed with: CRNA, Anesthesiologist and Surgeon  Anesthesia Plan Comments: (  )       Anesthesia Quick Evaluation

## 2018-09-01 ENCOUNTER — Other Ambulatory Visit: Payer: Self-pay

## 2018-09-01 ENCOUNTER — Encounter (HOSPITAL_COMMUNITY): Payer: Self-pay | Admitting: *Deleted

## 2018-09-01 ENCOUNTER — Ambulatory Visit (HOSPITAL_COMMUNITY): Payer: Medicare Other | Admitting: Anesthesiology

## 2018-09-01 ENCOUNTER — Encounter (HOSPITAL_COMMUNITY)
Admission: RE | Disposition: A | Discharge status: Home / Self Care | Payer: Self-pay | Source: Ambulatory Visit | Attending: Orthopedic Surgery

## 2018-09-01 ENCOUNTER — Observation Stay (HOSPITAL_COMMUNITY)
Admission: RE | Admit: 2018-09-01 | Discharge: 2018-09-02 | Disposition: A | Discharge status: Home / Self Care | Payer: Medicare Other | Source: Ambulatory Visit | Attending: Orthopedic Surgery | Admitting: Orthopedic Surgery

## 2018-09-01 DIAGNOSIS — Z79899 Other long term (current) drug therapy: Secondary | ICD-10-CM | POA: Diagnosis not present

## 2018-09-01 DIAGNOSIS — F4323 Adjustment disorder with mixed anxiety and depressed mood: Secondary | ICD-10-CM | POA: Insufficient documentation

## 2018-09-01 DIAGNOSIS — Z886 Allergy status to analgesic agent status: Secondary | ICD-10-CM | POA: Diagnosis not present

## 2018-09-01 DIAGNOSIS — E871 Hypo-osmolality and hyponatremia: Secondary | ICD-10-CM | POA: Diagnosis not present

## 2018-09-01 DIAGNOSIS — I6523 Occlusion and stenosis of bilateral carotid arteries: Secondary | ICD-10-CM | POA: Diagnosis not present

## 2018-09-01 DIAGNOSIS — I480 Paroxysmal atrial fibrillation: Secondary | ICD-10-CM | POA: Diagnosis not present

## 2018-09-01 DIAGNOSIS — Z880 Allergy status to penicillin: Secondary | ICD-10-CM | POA: Insufficient documentation

## 2018-09-01 DIAGNOSIS — N83209 Unspecified ovarian cyst, unspecified side: Secondary | ICD-10-CM | POA: Diagnosis not present

## 2018-09-01 DIAGNOSIS — I10 Essential (primary) hypertension: Secondary | ICD-10-CM | POA: Insufficient documentation

## 2018-09-01 DIAGNOSIS — M1711 Unilateral primary osteoarthritis, right knee: Secondary | ICD-10-CM | POA: Diagnosis not present

## 2018-09-01 DIAGNOSIS — G4733 Obstructive sleep apnea (adult) (pediatric): Secondary | ICD-10-CM | POA: Insufficient documentation

## 2018-09-01 DIAGNOSIS — Z7901 Long term (current) use of anticoagulants: Secondary | ICD-10-CM | POA: Insufficient documentation

## 2018-09-01 DIAGNOSIS — N119 Chronic tubulo-interstitial nephritis, unspecified: Secondary | ICD-10-CM | POA: Insufficient documentation

## 2018-09-01 DIAGNOSIS — H1030 Unspecified acute conjunctivitis, unspecified eye: Secondary | ICD-10-CM | POA: Diagnosis not present

## 2018-09-01 DIAGNOSIS — E1165 Type 2 diabetes mellitus with hyperglycemia: Secondary | ICD-10-CM | POA: Diagnosis not present

## 2018-09-01 DIAGNOSIS — M1990 Unspecified osteoarthritis, unspecified site: Secondary | ICD-10-CM | POA: Insufficient documentation

## 2018-09-01 DIAGNOSIS — Z888 Allergy status to other drugs, medicaments and biological substances status: Secondary | ICD-10-CM | POA: Diagnosis not present

## 2018-09-01 DIAGNOSIS — E785 Hyperlipidemia, unspecified: Secondary | ICD-10-CM | POA: Insufficient documentation

## 2018-09-01 DIAGNOSIS — D649 Anemia, unspecified: Secondary | ICD-10-CM | POA: Insufficient documentation

## 2018-09-01 DIAGNOSIS — I471 Supraventricular tachycardia: Secondary | ICD-10-CM | POA: Insufficient documentation

## 2018-09-01 DIAGNOSIS — G43909 Migraine, unspecified, not intractable, without status migrainosus: Secondary | ICD-10-CM | POA: Insufficient documentation

## 2018-09-01 DIAGNOSIS — M069 Rheumatoid arthritis, unspecified: Secondary | ICD-10-CM | POA: Insufficient documentation

## 2018-09-01 DIAGNOSIS — Z807 Family history of other malignant neoplasms of lymphoid, hematopoietic and related tissues: Secondary | ICD-10-CM | POA: Insufficient documentation

## 2018-09-01 DIAGNOSIS — E876 Hypokalemia: Secondary | ICD-10-CM | POA: Diagnosis not present

## 2018-09-01 DIAGNOSIS — Z96659 Presence of unspecified artificial knee joint: Secondary | ICD-10-CM

## 2018-09-01 DIAGNOSIS — Z803 Family history of malignant neoplasm of breast: Secondary | ICD-10-CM | POA: Insufficient documentation

## 2018-09-01 DIAGNOSIS — F329 Major depressive disorder, single episode, unspecified: Secondary | ICD-10-CM | POA: Diagnosis not present

## 2018-09-01 DIAGNOSIS — G8918 Other acute postprocedural pain: Secondary | ICD-10-CM | POA: Diagnosis not present

## 2018-09-01 DIAGNOSIS — Z8042 Family history of malignant neoplasm of prostate: Secondary | ICD-10-CM | POA: Insufficient documentation

## 2018-09-01 DIAGNOSIS — Z800 Family history of malignant neoplasm of digestive organs: Secondary | ICD-10-CM | POA: Insufficient documentation

## 2018-09-01 HISTORY — PX: TOTAL KNEE ARTHROPLASTY: SHX125

## 2018-09-01 SURGERY — ARTHROPLASTY, KNEE, TOTAL
Anesthesia: General | Site: Knee | Laterality: Right

## 2018-09-01 MED ORDER — FENTANYL CITRATE (PF) 100 MCG/2ML IJ SOLN
INTRAMUSCULAR | Status: DC | PRN
  Administered 2018-09-01 (×2): 50 ug via INTRAVENOUS

## 2018-09-01 MED ORDER — FERROUS SULFATE 325 (65 FE) MG PO TABS
325.0000 mg | ORAL_TABLET | Freq: Three times a day (TID) | ORAL | Status: DC
  Administered 2018-09-01 – 2018-09-02 (×4): 325 mg via ORAL
  Filled 2018-09-01 (×4): qty 1

## 2018-09-01 MED ORDER — METHOCARBAMOL 500 MG PO TABS
500.0000 mg | ORAL_TABLET | Freq: Four times a day (QID) | ORAL | Status: DC | PRN
  Filled 2018-09-01: qty 1

## 2018-09-01 MED ORDER — METOCLOPRAMIDE HCL 5 MG/ML IJ SOLN: 5.0000 mg | Freq: Three times a day (TID) | INTRAMUSCULAR | Status: DC | PRN

## 2018-09-01 MED ORDER — DOCUSATE SODIUM 100 MG PO CAPS
100.0000 mg | ORAL_CAPSULE | Freq: Two times a day (BID) | ORAL | Status: DC
  Administered 2018-09-01 – 2018-09-02 (×3): 100 mg via ORAL
  Filled 2018-09-01 (×3): qty 1

## 2018-09-01 MED ORDER — PROPOFOL 10 MG/ML IV BOLUS
INTRAVENOUS | Status: AC
  Filled 2018-09-01: qty 20

## 2018-09-01 MED ORDER — RIVAROXABAN 10 MG PO TABS
20.0000 mg | ORAL_TABLET | Freq: Every day | ORAL | Status: DC
  Administered 2018-09-02: 20 mg via ORAL
  Filled 2018-09-01: qty 2

## 2018-09-01 MED ORDER — TRANEXAMIC ACID-NACL 1000-0.7 MG/100ML-% IV SOLN
1000.0000 mg | Freq: Once | INTRAVENOUS | Status: AC
  Administered 2018-09-01: 1000 mg via INTRAVENOUS
  Filled 2018-09-01: qty 100

## 2018-09-01 MED ORDER — FENTANYL CITRATE (PF) 100 MCG/2ML IJ SOLN: 25.0000 ug | INTRAMUSCULAR | Status: DC | PRN

## 2018-09-01 MED ORDER — FLEET ENEMA 7-19 GM/118ML RE ENEM: 1.0000 | ENEMA | Freq: Once | RECTAL | Status: DC | PRN

## 2018-09-01 MED ORDER — TRAMADOL HCL 50 MG PO TABS
50.0000 mg | ORAL_TABLET | Freq: Four times a day (QID) | ORAL | Status: DC
  Administered 2018-09-01 (×2): 50 mg via ORAL
  Filled 2018-09-01 (×3): qty 1

## 2018-09-01 MED ORDER — PHENYLEPHRINE 40 MCG/ML (10ML) SYRINGE FOR IV PUSH (FOR BLOOD PRESSURE SUPPORT)
PREFILLED_SYRINGE | INTRAVENOUS | Status: AC
  Filled 2018-09-01: qty 10

## 2018-09-01 MED ORDER — PROPOFOL 500 MG/50ML IV EMUL
INTRAVENOUS | Status: DC | PRN
  Administered 2018-09-01: 100 ug/kg/min via INTRAVENOUS

## 2018-09-01 MED ORDER — ONDANSETRON HCL 4 MG/2ML IJ SOLN: 4.0000 mg | Freq: Four times a day (QID) | INTRAMUSCULAR | Status: DC | PRN

## 2018-09-01 MED ORDER — TRANEXAMIC ACID-NACL 1000-0.7 MG/100ML-% IV SOLN
1000.0000 mg | INTRAVENOUS | Status: AC
  Administered 2018-09-01: 1000 mg via INTRAVENOUS
  Filled 2018-09-01: qty 100

## 2018-09-01 MED ORDER — PROPOFOL 10 MG/ML IV BOLUS
INTRAVENOUS | Status: AC
  Filled 2018-09-01: qty 40

## 2018-09-01 MED ORDER — OXYCODONE HCL 5 MG PO TABS
5.0000 mg | ORAL_TABLET | ORAL | Status: DC | PRN
  Administered 2018-09-01 – 2018-09-02 (×3): 5 mg via ORAL
  Filled 2018-09-01 (×3): qty 1

## 2018-09-01 MED ORDER — PHENYLEPHRINE 40 MCG/ML (10ML) SYRINGE FOR IV PUSH (FOR BLOOD PRESSURE SUPPORT)
PREFILLED_SYRINGE | INTRAVENOUS | Status: DC | PRN
  Administered 2018-09-01 (×8): 80 ug via INTRAVENOUS

## 2018-09-01 MED ORDER — SERTRALINE HCL 50 MG PO TABS
50.0000 mg | ORAL_TABLET | Freq: Every morning | ORAL | Status: DC
  Administered 2018-09-02: 50 mg via ORAL
  Filled 2018-09-01: qty 1

## 2018-09-01 MED ORDER — LACTATED RINGERS IV SOLN
INTRAVENOUS | Status: DC
  Administered 2018-09-01 (×2): via INTRAVENOUS

## 2018-09-01 MED ORDER — BUPIVACAINE LIPOSOME 1.3 % IJ SUSP
INTRAMUSCULAR | Status: DC | PRN
  Administered 2018-09-01: 20 mL

## 2018-09-01 MED ORDER — BUPIVACAINE HCL (PF) 0.25 % IJ SOLN
INTRAMUSCULAR | Status: DC | PRN
  Administered 2018-09-01: 30 mL

## 2018-09-01 MED ORDER — ACETAMINOPHEN 500 MG PO TABS
1000.0000 mg | ORAL_TABLET | Freq: Once | ORAL | Status: AC
  Administered 2018-09-01: 1000 mg via ORAL
  Filled 2018-09-01: qty 2

## 2018-09-01 MED ORDER — CLINDAMYCIN PHOSPHATE 600 MG/50ML IV SOLN
600.0000 mg | Freq: Four times a day (QID) | INTRAVENOUS | Status: AC
  Administered 2018-09-01 (×2): 600 mg via INTRAVENOUS
  Filled 2018-09-01 (×2): qty 50

## 2018-09-01 MED ORDER — DEXAMETHASONE SODIUM PHOSPHATE 10 MG/ML IJ SOLN
10.0000 mg | Freq: Once | INTRAMUSCULAR | Status: AC
  Administered 2018-09-02: 10 mg via INTRAVENOUS
  Filled 2018-09-01: qty 1

## 2018-09-01 MED ORDER — STERILE WATER FOR IRRIGATION IR SOLN
Status: DC | PRN
  Administered 2018-09-01: 2000 mL

## 2018-09-01 MED ORDER — 0.9 % SODIUM CHLORIDE (POUR BTL) OPTIME
TOPICAL | Status: DC | PRN
  Administered 2018-09-01: 1000 mL

## 2018-09-01 MED ORDER — HYDROXYCHLOROQUINE SULFATE 200 MG PO TABS
200.0000 mg | ORAL_TABLET | Freq: Every morning | ORAL | Status: DC
  Administered 2018-09-02: 200 mg via ORAL
  Filled 2018-09-01: qty 1

## 2018-09-01 MED ORDER — SODIUM CHLORIDE (PF) 0.9 % IJ SOLN
INTRAMUSCULAR | Status: AC
  Filled 2018-09-01: qty 50

## 2018-09-01 MED ORDER — SODIUM CHLORIDE 0.9 % IR SOLN
Status: DC | PRN
  Administered 2018-09-01: 1000 mL

## 2018-09-01 MED ORDER — MIDAZOLAM HCL 2 MG/2ML IJ SOLN
INTRAMUSCULAR | Status: AC
  Filled 2018-09-01: qty 2

## 2018-09-01 MED ORDER — METOCLOPRAMIDE HCL 5 MG PO TABS: 5.0000 mg | ORAL_TABLET | Freq: Three times a day (TID) | ORAL | Status: DC | PRN

## 2018-09-01 MED ORDER — METHOCARBAMOL 500 MG IVPB - SIMPLE MED
INTRAVENOUS | Status: AC
  Filled 2018-09-01: qty 50

## 2018-09-01 MED ORDER — ONDANSETRON HCL 4 MG PO TABS: 4.0000 mg | ORAL_TABLET | Freq: Four times a day (QID) | ORAL | Status: DC | PRN

## 2018-09-01 MED ORDER — BUPIVACAINE HCL (PF) 0.25 % IJ SOLN
INTRAMUSCULAR | Status: AC
  Filled 2018-09-01: qty 30

## 2018-09-01 MED ORDER — FENTANYL CITRATE (PF) 100 MCG/2ML IJ SOLN
INTRAMUSCULAR | Status: AC
  Filled 2018-09-01: qty 2

## 2018-09-01 MED ORDER — BUPIVACAINE LIPOSOME 1.3 % IJ SUSP
20.0000 mL | Freq: Once | INTRAMUSCULAR | Status: DC
  Filled 2018-09-01: qty 20

## 2018-09-01 MED ORDER — BISACODYL 5 MG PO TBEC: 5.0000 mg | DELAYED_RELEASE_TABLET | Freq: Every day | ORAL | Status: DC | PRN

## 2018-09-01 MED ORDER — SENNOSIDES-DOCUSATE SODIUM 8.6-50 MG PO TABS: 1.0000 | ORAL_TABLET | Freq: Every evening | ORAL | Status: DC | PRN

## 2018-09-01 MED ORDER — METHOCARBAMOL 500 MG IVPB - SIMPLE MED
500.0000 mg | Freq: Four times a day (QID) | INTRAVENOUS | Status: DC | PRN
  Administered 2018-09-01: 500 mg via INTRAVENOUS
  Filled 2018-09-01: qty 50

## 2018-09-01 MED ORDER — BUPIVACAINE IN DEXTROSE 0.75-8.25 % IT SOLN
INTRATHECAL | Status: DC | PRN
  Administered 2018-09-01: 1.8 mL via INTRATHECAL

## 2018-09-01 MED ORDER — MEPERIDINE HCL 50 MG/ML IJ SOLN: 6.2500 mg | INTRAMUSCULAR | Status: DC | PRN

## 2018-09-01 MED ORDER — CEFAZOLIN SODIUM-DEXTROSE 2-4 GM/100ML-% IV SOLN
2.0000 g | INTRAVENOUS | Status: AC
  Administered 2018-09-01: 2 g via INTRAVENOUS
  Filled 2018-09-01: qty 100

## 2018-09-01 MED ORDER — ONDANSETRON HCL 4 MG/2ML IJ SOLN
INTRAMUSCULAR | Status: DC | PRN
  Administered 2018-09-01: 4 mg via INTRAVENOUS

## 2018-09-01 MED ORDER — HYDROMORPHONE HCL 1 MG/ML IJ SOLN: 0.5000 mg | INTRAMUSCULAR | Status: DC | PRN

## 2018-09-01 MED ORDER — ATENOLOL 50 MG PO TABS
50.0000 mg | ORAL_TABLET | Freq: Every morning | ORAL | Status: DC
  Administered 2018-09-02: 50 mg via ORAL
  Filled 2018-09-01: qty 1

## 2018-09-01 MED ORDER — MENTHOL 3 MG MT LOZG: 1.0000 | LOZENGE | OROMUCOSAL | Status: DC | PRN

## 2018-09-01 MED ORDER — GABAPENTIN 300 MG PO CAPS
300.0000 mg | ORAL_CAPSULE | Freq: Three times a day (TID) | ORAL | Status: DC
  Administered 2018-09-01 – 2018-09-02 (×4): 300 mg via ORAL
  Filled 2018-09-01 (×4): qty 1

## 2018-09-01 MED ORDER — SODIUM CHLORIDE 0.9 % IV SOLN
INTRAVENOUS | Status: DC
  Administered 2018-09-01 – 2018-09-02 (×2): via INTRAVENOUS

## 2018-09-01 MED ORDER — DEXAMETHASONE SODIUM PHOSPHATE 10 MG/ML IJ SOLN
8.0000 mg | Freq: Once | INTRAMUSCULAR | Status: AC
  Administered 2018-09-01: 8 mg via INTRAVENOUS

## 2018-09-01 MED ORDER — ALUM & MAG HYDROXIDE-SIMETH 200-200-20 MG/5ML PO SUSP: 30.0000 mL | ORAL | Status: DC | PRN

## 2018-09-01 MED ORDER — ZOLPIDEM TARTRATE 5 MG PO TABS: 5.0000 mg | ORAL_TABLET | Freq: Every evening | ORAL | Status: DC | PRN

## 2018-09-01 MED ORDER — SODIUM CHLORIDE 0.9% FLUSH
INTRAVENOUS | Status: DC | PRN
  Administered 2018-09-01: 20 mL

## 2018-09-01 MED ORDER — BUPIVACAINE HCL (PF) 0.5 % IJ SOLN
INTRAMUSCULAR | Status: AC
  Filled 2018-09-01: qty 30

## 2018-09-01 MED ORDER — PHENOL 1.4 % MT LIQD
1.0000 | OROMUCOSAL | Status: DC | PRN
  Filled 2018-09-01: qty 177

## 2018-09-01 MED ORDER — PANTOPRAZOLE SODIUM 40 MG PO TBEC
40.0000 mg | DELAYED_RELEASE_TABLET | Freq: Every day | ORAL | Status: DC
  Administered 2018-09-01 – 2018-09-02 (×2): 40 mg via ORAL
  Filled 2018-09-01 (×2): qty 1

## 2018-09-01 MED ORDER — ACETAMINOPHEN 500 MG PO TABS
1000.0000 mg | ORAL_TABLET | Freq: Four times a day (QID) | ORAL | Status: AC
  Administered 2018-09-01 – 2018-09-02 (×3): 1000 mg via ORAL
  Filled 2018-09-01 (×4): qty 2

## 2018-09-01 MED ORDER — DIPHENHYDRAMINE HCL 12.5 MG/5ML PO ELIX: 12.5000 mg | ORAL_SOLUTION | ORAL | Status: DC | PRN

## 2018-09-01 MED ORDER — MIDAZOLAM HCL 5 MG/5ML IJ SOLN
INTRAMUSCULAR | Status: DC | PRN
  Administered 2018-09-01: 2 mg via INTRAVENOUS

## 2018-09-01 MED ORDER — GABAPENTIN 300 MG PO CAPS
300.0000 mg | ORAL_CAPSULE | Freq: Once | ORAL | Status: AC
  Administered 2018-09-01: 300 mg via ORAL
  Filled 2018-09-01: qty 1

## 2018-09-01 MED ORDER — ROPIVACAINE HCL 7.5 MG/ML IJ SOLN
INTRAMUSCULAR | Status: DC | PRN
  Administered 2018-09-01: 30 mL via PERINEURAL

## 2018-09-01 MED ORDER — ROSUVASTATIN CALCIUM 5 MG PO TABS
5.0000 mg | ORAL_TABLET | Freq: Every morning | ORAL | Status: DC
  Administered 2018-09-02: 5 mg via ORAL
  Filled 2018-09-01: qty 1

## 2018-09-01 SURGICAL SUPPLY — 56 items
ARTISURF 10M PLY R 6-9CD KNEE (Knees) ×3 IMPLANT
BAG ZIPLOCK 12X15 (MISCELLANEOUS) ×3 IMPLANT
BANDAGE ACE 6X5 VEL STRL LF (GAUZE/BANDAGES/DRESSINGS) ×3 IMPLANT
BLADE SAGITTAL 13X1.27X60 (BLADE) ×2 IMPLANT
BLADE SAGITTAL 13X1.27X60MM (BLADE) ×1
BLADE SAW SGTL 83.5X18.5 (BLADE) ×3 IMPLANT
BLADE SURG 15 STRL LF DISP TIS (BLADE) ×1 IMPLANT
BLADE SURG 15 STRL SS (BLADE) ×2
BNDG ELASTIC 6X10 VLCR STRL LF (GAUZE/BANDAGES/DRESSINGS) ×3 IMPLANT
BOWL SMART MIX CTS (DISPOSABLE) ×3 IMPLANT
CEMENT BONE SIMPLEX SPEEDSET (Cement) ×6 IMPLANT
CLOSURE WOUND 1/2 X4 (GAUZE/BANDAGES/DRESSINGS) ×2
COMP FEM PERSONA SZ7 RT (Joint) ×3 IMPLANT
COMPONENT FEM PERSONA SZ7 RT (Joint) ×1 IMPLANT
COVER SURGICAL LIGHT HANDLE (MISCELLANEOUS) ×3 IMPLANT
COVER WAND RF STERILE (DRAPES) IMPLANT
CUFF TOURN SGL QUICK 34 (TOURNIQUET CUFF) ×2
CUFF TRNQT CYL 34X4X40X1 (TOURNIQUET CUFF) ×1 IMPLANT
DECANTER SPIKE VIAL GLASS SM (MISCELLANEOUS) ×6 IMPLANT
DRAPE INCISE IOBAN 66X45 STRL (DRAPES) ×6 IMPLANT
DRAPE U-SHAPE 47X51 STRL (DRAPES) ×3 IMPLANT
DRSG AQUACEL AG ADV 3.5X10 (GAUZE/BANDAGES/DRESSINGS) ×3 IMPLANT
DURAPREP 26ML APPLICATOR (WOUND CARE) ×6 IMPLANT
ELECT REM PT RETURN 15FT ADLT (MISCELLANEOUS) ×3 IMPLANT
GLOVE BIOGEL M STRL SZ7.5 (GLOVE) IMPLANT
GLOVE BIOGEL PI IND STRL 7.5 (GLOVE) IMPLANT
GLOVE BIOGEL PI IND STRL 8.5 (GLOVE) ×2 IMPLANT
GLOVE BIOGEL PI INDICATOR 7.5 (GLOVE)
GLOVE BIOGEL PI INDICATOR 8.5 (GLOVE) ×4
GLOVE SURG ORTHO 8.0 STRL STRW (GLOVE) ×9 IMPLANT
GOWN STRL REUS W/ TWL XL LVL3 (GOWN DISPOSABLE) ×2 IMPLANT
GOWN STRL REUS W/TWL 2XL LVL3 (GOWN DISPOSABLE) ×3 IMPLANT
GOWN STRL REUS W/TWL XL LVL3 (GOWN DISPOSABLE) ×4
HANDPIECE INTERPULSE COAX TIP (DISPOSABLE) ×2
HOOD PEEL AWAY FLYTE STAYCOOL (MISCELLANEOUS) ×9 IMPLANT
MANIFOLD NEPTUNE II (INSTRUMENTS) ×3 IMPLANT
NS IRRIG 1000ML POUR BTL (IV SOLUTION) ×3 IMPLANT
PACK TOTAL KNEE CUSTOM (KITS) ×3 IMPLANT
PROTECTOR NERVE ULNAR (MISCELLANEOUS) ×3 IMPLANT
SET HNDPC FAN SPRY TIP SCT (DISPOSABLE) ×1 IMPLANT
STEM POLY PAT PLY 32M KNEE (Knees) ×3 IMPLANT
STEM TIBIA 5 DEG SZ C R KNEE (Knees) ×1 IMPLANT
STRIP CLOSURE SKIN 1/2X4 (GAUZE/BANDAGES/DRESSINGS) ×4 IMPLANT
SUT BONE WAX W31G (SUTURE) ×3 IMPLANT
SUT MNCRL AB 3-0 PS2 18 (SUTURE) ×3 IMPLANT
SUT STRATAFIX 0 PDS 27 VIOLET (SUTURE) ×3
SUT STRATAFIX PDS+ 0 24IN (SUTURE) ×3 IMPLANT
SUT VIC AB 1 CT1 36 (SUTURE) ×3 IMPLANT
SUTURE STRATFX 0 PDS 27 VIOLET (SUTURE) ×1 IMPLANT
SYR CONTROL 10ML LL (SYRINGE) ×6 IMPLANT
TIBIA STEM 5 DEG SZ C R KNEE (Knees) ×3 IMPLANT
TIP HIGH FLOW IRRIGATION COAX (MISCELLANEOUS) ×3 IMPLANT
TRAY FOLEY MTR SLVR 16FR STAT (SET/KITS/TRAYS/PACK) ×3 IMPLANT
WATER STERILE IRR 1000ML POUR (IV SOLUTION) ×6 IMPLANT
WRAP KNEE MAXI GEL POST OP (GAUZE/BANDAGES/DRESSINGS) ×3 IMPLANT
YANKAUER SUCT BULB TIP 10FT TU (MISCELLANEOUS) ×3 IMPLANT

## 2018-09-01 NOTE — Transfer of Care (Signed)
Immediate Anesthesia Transfer of Care Note  Patient: Amanda Wilkins  Procedure(s) Performed: RIGHT TOTAL KNEE ARTHROPLASTY (Right Knee)  Patient Location: PACU  Anesthesia Type:Spinal and MAC combined with regional for post-op pain  Level of Consciousness: awake, alert , oriented and patient cooperative  Airway & Oxygen Therapy: Patient Spontanous Breathing and Patient connected to face mask oxygen  Post-op Assessment: Report given to RN and Post -op Vital signs reviewed and stable  Post vital signs: Reviewed and stable  Last Vitals:  Vitals Value Taken Time  BP    Temp    Pulse    Resp    SpO2      Last Pain:  Vitals:   09/01/18 0612  TempSrc: Oral  PainSc:          Complications: No apparent anesthesia complications

## 2018-09-01 NOTE — Anesthesia Postprocedure Evaluation (Signed)
Anesthesia Post Note  Patient: Amanda Wilkins  Procedure(s) Performed: RIGHT TOTAL KNEE ARTHROPLASTY (Right Knee)     Patient location during evaluation: PACU Anesthesia Type: General Level of consciousness: awake and alert Pain management: pain level controlled Vital Signs Assessment: post-procedure vital signs reviewed and stable Respiratory status: spontaneous breathing, nonlabored ventilation, respiratory function stable and patient connected to nasal cannula oxygen Cardiovascular status: blood pressure returned to baseline and stable Postop Assessment: no apparent nausea or vomiting Anesthetic complications: no    Last Vitals:  Vitals:   09/01/18 1036 09/01/18 1153  BP: 125/65 138/76  Pulse: 67 79  Resp: 16 16  Temp: 36.4 C   SpO2: 100% 100%    Last Pain:  Vitals:   09/01/18 1036  TempSrc: Oral  PainSc: 0-No pain                 Jerral Mccauley

## 2018-09-01 NOTE — Discharge Instructions (Signed)

## 2018-09-01 NOTE — Progress Notes (Signed)
Physical Therapy Treatment Patient Details Name: Amanda Wilkins MRN: [id number] DOB: 15-Dec-1945 Today's Date: 09/01/2018    History of Present Illness Pt is 72 yo female s/p R TKR on 09/01/18. PMH includes ataxia, vertigo, OA, RA, migraines, afib, OSA with CPAP, cellulitis, renal insufficiency, DOE, L eye low vision, adjustment disorder, depression, HTN, L knee arthroscopy, R shoulder arthroscopy.     PT Comments    Pt ambulated 10 ft this session, and presented with R knee buckling that was both PT and pt-corrected. Pt limited by pain and anxiety. Pt reporting numbness along R lateral foot and along the plantar and dorsal surface of the foot. RN notified MD's PA, will follow up tomorrow.     Follow Up Recommendations  Follow surgeon's recommendation for DC plan and follow-up therapies;Supervision for mobility/OOB(HHPT)     Equipment Recommendations  None recommended by PT    Recommendations for Other Services       Precautions / Restrictions Precautions Precautions: Fall Restrictions Weight Bearing Restrictions: No Other Position/Activity Restrictions: WBAT     Mobility  Bed Mobility Overal bed mobility: Needs Assistance Bed Mobility: Supine to Sit;Sit to Supine     Supine to sit: Min assist;HOB elevated Sit to supine: Min assist;HOB elevated   General bed mobility comments: Min assist for supine<>sit for RLE management.  Transfers Overall transfer level: Needs assistance Equipment used: Rolling walker (2 wheeled) Transfers: Sit to/from Stand Sit to Stand: Min assist;From elevated surface         General transfer comment: Min assist for power up and steadying upon standing. Pt very anxious and initially stated she could not ambulate. PT provided verbal encouragement and had pt weight shift as pre-gait.   Ambulation/Gait Ambulation/Gait assistance: Min assist Gait Distance (Feet): 10 Feet Assistive device: Rolling walker (2 wheeled) Gait  Pattern/deviations: Step-to pattern;Decreased stride length;Decreased weight shift to right;Decreased stance time - right;Antalgic Gait velocity: decr    General Gait Details: Pt with continuous verbal and tactile cuing during ambulation. Verbal cuing for stepping into RW, sequencing, turning. PT guarding R knee for second half of ambulation because pt with mild buckling with fatigue. Pt quickly sat when she got to the EOB, and rather unsafely. Pt reported feeling nervous and needing to sit.    Stairs             Wheelchair Mobility    Modified Rankin (Stroke Patients Only)       Balance Overall balance assessment: Needs assistance Sitting-balance support: No upper extremity supported Sitting balance-Leahy Scale: Good     Standing balance support: Bilateral upper extremity supported Standing balance-Leahy Scale: Poor Standing balance comment: relies on RW and PT steadying                             Cognition Arousal/Alertness: Awake/alert Behavior During Therapy: Anxious Overall Cognitive Status: Within Functional Limits for tasks assessed                                        Exercises      General Comments        Pertinent Vitals/Pain Pain Assessment: 0-10 Pain Score: 7  Pain Location: R knee, midthigh  Pain Descriptors / Indicators: Sore Pain Intervention(s): Limited activity within patient's tolerance;Repositioned;Monitored during session;Premedicated before session    Home Living  Prior Function            PT Goals (current goals can now be found in the care plan section) Acute Rehab PT Goals PT Goal Formulation: With patient Time For Goal Achievement: 09/08/18 Potential to Achieve Goals: Good Progress towards PT goals: Progressing toward goals    Frequency    7X/week      PT Plan Current plan remains appropriate    Co-evaluation              AM-PAC PT "6 Clicks" Daily  Activity  Outcome Measure  Difficulty turning over in bed (including adjusting bedclothes, sheets and blankets)?: Unable Difficulty moving from lying on back to sitting on the side of the bed? : Unable Difficulty sitting down on and standing up from a chair with arms (e.g., wheelchair, bedside commode, etc,.)?: Unable Help needed moving to and from a bed to chair (including a wheelchair)?: A Little Help needed walking in hospital room?: A Little Help needed climbing 3-5 steps with a railing? : A Lot 6 Click Score: 11    End of Session Equipment Utilized During Treatment: Gait belt;Oxygen(O2 reapplied after session ) Activity Tolerance: Patient limited by fatigue;Other (comment)(anxiety) Patient left: in bed;with bed alarm set;with call bell/phone within reach;with family/visitor present Nurse Communication: Mobility status PT Visit Diagnosis: Unsteadiness on feet (R26.81);Other abnormalities of gait and mobility (R26.89)     Time: 2004-2017 PT Time Calculation (min) (ACUTE ONLY): 13 min  Charges:  $Gait Training: 8-22 mins                     Nicola Police, PT Acute Rehabilitation Services Pager [phone]  Office [phone]   Amanda Wilkins 09/01/2018, 9:21 PM

## 2018-09-01 NOTE — Care Plan (Signed)
D/C home w/ family, HHPT, equipment ordered.
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
## 2018-09-01 NOTE — Anesthesia Procedure Notes (Addendum)
Anesthesia Regional Block: Adductor canal block   Pre-Anesthetic Checklist: ,, timeout performed, Correct Patient, Correct Site, Correct Laterality, Correct Procedure, Correct Position, site marked, Risks and benefits discussed,  Surgical consent,  Pre-op evaluation,  At surgeon's request and post-op pain management  Laterality: Right  Prep: chloraprep       Needles:  Injection technique: Single-shot  Needle Type: Echogenic Stimulator Needle     Needle Length: 5cm  Needle Gauge: 22     Additional Needles:   Procedures:, nerve stimulator,,, ultrasound used (permanent image in chart),,,,  Narrative:  Start time: 09/01/2018 6:50 AM End time: 09/01/2018 7:00 AM Injection made incrementally with aspirations every 5 mL.  Performed by: Personally  Anesthesiologist: Janeece Riggers, MD  Additional Notes: Functioning IV was confirmed and monitors were applied.  A 47mm 22ga Arrow echogenic stimulator needle was used. Sterile prep and drape,hand hygiene and sterile gloves were used. Ultrasound guidance: relevant anatomy identified, needle position confirmed, local anesthetic spread visualized around nerve(s)., vascular puncture avoided.  Image printed for medical record. Negative aspiration and negative test dose prior to incremental administration of local anesthetic. The patient tolerated the procedure well.

## 2018-09-01 NOTE — Anesthesia Procedure Notes (Signed)
Spinal  Patient location during procedure: OR Start time: 09/01/2018 7:39 AM End time: 09/01/2018 7:41 AM Staffing Anesthesiologist: Janeece Riggers, MD Resident/CRNA: Lind Covert, CRNA Performed: resident/CRNA  Preanesthetic Checklist Completed: patient identified, site marked, surgical consent, pre-op evaluation, timeout performed, IV checked, risks and benefits discussed and monitors and equipment checked Spinal Block Patient position: sitting Prep: DuraPrep Patient monitoring: heart rate, cardiac monitor, continuous pulse ox and blood pressure Approach: midline Location: L3-4 Injection technique: single-shot Needle Needle type: Pencan  Needle gauge: 24 G Needle length: 10 cm Needle insertion depth: 7 cm Assessment Sensory level: T6 Additional Notes Timeout performed. SAB kit date checked. SAB without difficulty

## 2018-09-01 NOTE — Evaluation (Signed)
Physical Therapy Evaluation Patient Details Name: Amanda Wilkins MRN: [id number] DOB: 11/29/1945 Today's Date: 09/01/2018   History of Present Illness  Pt is 72 yo female s/p R TKR on 09/01/18. PMH includes ataxia, vertigo, OA, RA, migraines, afib, OSA with CPAP, cellulitis, renal insufficiency, DOE, L eye low vision, adjustment disorder, depression, HTN, L knee arthroscopy, R shoulder arthroscopy.   Clinical Impression   Pt presents with RLE weakness, decreased R knee ROM, difficulty performing mobility tasks, and unsteadiness upon standing. Pt to benefit from acute PT to address deficits. Pt unable to tolerate ambulation this session because of LE unsteadiness in standing. PT assisted in steadying pt in standing. PT to check back with pt to ambulate as schedule allows. PT to progress mobility as tolerated, and will continue to follow acutely.      Follow Up Recommendations Follow surgeon's recommendation for DC plan and follow-up therapies;Supervision for mobility/OOB(HHPT)    Equipment Recommendations  None recommended by PT    Recommendations for Other Services       Precautions / Restrictions Precautions Precautions: Fall Restrictions Weight Bearing Restrictions: No Other Position/Activity Restrictions: WBAT       Mobility  Bed Mobility Overal bed mobility: Needs Assistance Bed Mobility: Supine to Sit;Sit to Supine     Supine to sit: Min assist;HOB elevated Sit to supine: Min assist;HOB elevated   General bed mobility comments: Min assist for supine<>sit for RLE management. Increased time to scoot to EOB.   Transfers Overall transfer level: Needs assistance Equipment used: Rolling walker (2 wheeled) Transfers: Sit to/from Stand Sit to Stand: Min assist;From elevated surface         General transfer comment: Sit to stand x2. First attempt pt reported feeling unsteady and weak in LEs, and RW was rocking. Min assist for steadying RW/pt and assisting pt in slow  eccentric lowering to bed. Pt wanted to attempt sit to stand again, but pt with similar presentation upon second try.   Ambulation/Gait Ambulation/Gait assistance: (NT - pt reported LEs felt weak and unsteady, not safe to ambulate yet)              Stairs            Wheelchair Mobility    Modified Rankin (Stroke Patients Only)       Balance Overall balance assessment: Needs assistance Sitting-balance support: No upper extremity supported Sitting balance-Leahy Scale: Good     Standing balance support: Bilateral upper extremity supported Standing balance-Leahy Scale: Poor Standing balance comment: relies on RW and PT steadying                              Pertinent Vitals/Pain Pain Assessment: 0-10 Pain Score: 2  Pain Location: R knee, midthigh  Pain Descriptors / Indicators: Sore Pain Intervention(s): Limited activity within patient's tolerance;Repositioned;Ice applied;Monitored during session    Home Living Family/patient expects to be discharged to:: Private residence Living Arrangements: Spouse/significant other Available Help at Discharge: Family;Available 24 hours/day Type of Home: House Home Access: Stairs to enter Entrance Stairs-Rails: Right Entrance Stairs-Number of Steps: 6 Home Layout: One level Home Equipment: Bedside commode;Walker - 2 wheels Additional Comments: CPM     Prior Function Level of Independence: Independent with assistive device(s)         Comments: Pt using RW and crutches prior to surgery for 2 weeks (pt reports this 2 week stent was 1.5 months ago)     Hand Dominance  Dominant Hand: Right    Extremity/Trunk Assessment   Upper Extremity Assessment Upper Extremity Assessment: Overall WFL for tasks assessed    Lower Extremity Assessment Lower Extremity Assessment: Overall WFL for tasks assessed;RLE deficits/detail RLE Deficits / Details: suspected post-surgical weakness; able to perform quad set, ankle  pumps, SLR with 10* quad lag (assisted by PT to correct quad lag) RLE Sensation: WNL    Cervical / Trunk Assessment Cervical / Trunk Assessment: Normal  Communication   Communication: No difficulties  Cognition Arousal/Alertness: Awake/alert Behavior During Therapy: WFL for tasks assessed/performed Overall Cognitive Status: Within Functional Limits for tasks assessed                                        General Comments General comments (skin integrity, edema, etc.): R knee AAROM ~5-90*    Exercises     Assessment/Plan    PT Assessment Patient needs continued PT services  PT Problem List Decreased strength;Pain;Decreased range of motion;Decreased activity tolerance;Decreased knowledge of use of DME;Decreased balance;Decreased safety awareness;Decreased mobility       PT Treatment Interventions DME instruction;Therapeutic activities;Gait training;Therapeutic exercise;Stair training;Balance training;Patient/family education;Functional mobility training    PT Goals (Current goals can be found in the Care Plan section)  Acute Rehab PT Goals PT Goal Formulation: With patient Time For Goal Achievement: 09/08/18 Potential to Achieve Goals: Good    Frequency 7X/week   Barriers to discharge        Co-evaluation               AM-PAC PT "6 Clicks" Daily Activity  Outcome Measure Difficulty turning over in bed (including adjusting bedclothes, sheets and blankets)?: Unable Difficulty moving from lying on back to sitting on the side of the bed? : Unable Difficulty sitting down on and standing up from a chair with arms (e.g., wheelchair, bedside commode, etc,.)?: Unable Help needed moving to and from a bed to chair (including a wheelchair)?: A Little Help needed walking in hospital room?: A Little Help needed climbing 3-5 steps with a railing? : A Lot 6 Click Score: 11    End of Session Equipment Utilized During Treatment: Gait belt;Oxygen(O2 reapplied  after session ) Activity Tolerance: Patient limited by fatigue;Other (comment)(Unsteadiness in standing) Patient left: in bed;with bed alarm set;with call bell/phone within reach;with family/visitor present;with SCD's reapplied Nurse Communication: Mobility status PT Visit Diagnosis: Unsteadiness on feet (R26.81);Other abnormalities of gait and mobility (R26.89)    Time: 6962-9528 PT Time Calculation (min) (ACUTE ONLY): 20 min   Charges:   PT Evaluation $PT Eval Low Complexity: 1 Low          Aryaman Haliburton Terrial Rhodes, PT Acute Rehabilitation Services Pager [phone]  Office [phone]  Raja Liska D Despina Hidden 09/01/2018, 3:42 PM

## 2018-09-02 ENCOUNTER — Encounter (HOSPITAL_COMMUNITY): Payer: Self-pay | Admitting: Orthopedic Surgery

## 2018-09-02 DIAGNOSIS — N119 Chronic tubulo-interstitial nephritis, unspecified: Secondary | ICD-10-CM | POA: Diagnosis not present

## 2018-09-02 DIAGNOSIS — I10 Essential (primary) hypertension: Secondary | ICD-10-CM | POA: Diagnosis not present

## 2018-09-02 DIAGNOSIS — E871 Hypo-osmolality and hyponatremia: Secondary | ICD-10-CM | POA: Diagnosis not present

## 2018-09-02 DIAGNOSIS — F329 Major depressive disorder, single episode, unspecified: Secondary | ICD-10-CM | POA: Diagnosis not present

## 2018-09-02 DIAGNOSIS — M1711 Unilateral primary osteoarthritis, right knee: Secondary | ICD-10-CM | POA: Diagnosis not present

## 2018-09-02 DIAGNOSIS — Z7901 Long term (current) use of anticoagulants: Secondary | ICD-10-CM | POA: Diagnosis not present

## 2018-09-02 LAB — CBC
HCT: 32 % — ABNORMAL LOW (ref 36.0–46.0)
Hemoglobin: 10.5 g/dL — ABNORMAL LOW (ref 12.0–15.0)
MCH: 33.4 pg (ref 26.0–34.0)
MCHC: 32.8 g/dL (ref 30.0–36.0)
MCV: 101.9 fL — ABNORMAL HIGH (ref 80.0–100.0)
Platelets: 176 K/uL (ref 150–400)
RBC: 3.14 MIL/uL — ABNORMAL LOW (ref 3.87–5.11)
RDW: 12.6 % (ref 11.5–15.5)
WBC: 9.5 K/uL (ref 4.0–10.5)
nRBC: 0 % (ref 0.0–0.2)

## 2018-09-02 LAB — BASIC METABOLIC PANEL WITH GFR
Anion gap: 10 (ref 5–15)
BUN: 13 mg/dL (ref 8–23)
CO2: 21 mmol/L — ABNORMAL LOW (ref 22–32)
Calcium: 8.7 mg/dL — ABNORMAL LOW (ref 8.9–10.3)
Chloride: 103 mmol/L (ref 98–111)
Creatinine, Ser: 0.58 mg/dL (ref 0.44–1.00)
GFR calc Af Amer: >60 mL/min
GFR calc non Af Amer: >60 mL/min
Glucose, Bld: 135 mg/dL — ABNORMAL HIGH (ref 70–99)
Potassium: 4.4 mmol/L (ref 3.5–5.1)
Sodium: 134 mmol/L — ABNORMAL LOW (ref 135–145)

## 2018-09-02 MED ORDER — OXYCODONE HCL 5 MG PO TABS: 5.0000 mg | ORAL_TABLET | Freq: Four times a day (QID) | ORAL | 0 refills | Status: DC | PRN

## 2018-09-02 MED ORDER — METHOCARBAMOL 500 MG PO TABS: 500.0000 mg | ORAL_TABLET | Freq: Four times a day (QID) | ORAL | 0 refills | Status: DC | PRN

## 2018-09-02 NOTE — Care Management Obs Status (Signed)
[place] NOTIFICATION   Patient Details  Name: Amanda Wilkins MRN: [id number] Date of Birth: [birth year]   Medicare Observation Status Notification Given:  Yes    Guadalupe Maple, RN 09/02/2018, 12:33 PM

## 2018-09-02 NOTE — Progress Notes (Signed)
SPORTS MEDICINE AND JOINT REPLACEMENT  Lara Mulch, MD    Carlyon Shadow, PA-C [address], [place], [place]  [address]                             [phone]   PROGRESS NOTE  Subjective:  negative for Chest Pain  negative for Shortness of Breath  negative for Nausea/Vomiting   negative for Calf Pain  negative for Bowel Movement   Tolerating Diet: yes         Patient reports pain as 3 on 0-10 scale.    Objective: Vital signs in last 24 hours:    Patient Vitals for the past 24 hrs:  BP Temp Temp src Pulse Resp SpO2  09/02/18 0623 (!) 143/69 97.9 F (36.6 C) Oral 73 - 100 %  09/02/18 0203 125/69 97.8 F (36.6 C) Oral 74 16 99 %  09/01/18 2108 129/62 98.4 F (36.9 C) Oral 74 15 99 %  09/01/18 1718 116/66 97.6 F (36.4 C) Oral 77 16 99 %  09/01/18 1344 121/66 97.7 F (36.5 C) Oral 69 16 98 %  09/01/18 1240 121/70 97.7 F (36.5 C) Oral 72 16 100 %  09/01/18 1153 138/76 - - 79 16 100 %  09/01/18 1036 125/65 97.6 F (36.4 C) Oral 67 16 100 %  09/01/18 1015 128/60 - - 66 14 100 %  09/01/18 1000 136/64 (!) 97.5 F (36.4 C) - 65 12 100 %  09/01/18 0945 123/73 - - 65 14 100 %  09/01/18 0930 121/79 - - 70 13 100 %  09/01/18 0915 101/64 - - 70 12 100 %  09/01/18 0900 (!) 102/59 97.6 F (36.4 C) - 70 18 93 %    @flow {1959:LAST@   Intake/Output from previous day:   11/18 0701 - 11/19 0700 In: 3533.2 [P.O.:180; I.V.:2953.2] Out: 2195 [Urine:2175]   Intake/Output this shift:   11/18 1901 - 11/19 0700 In: 1040.4 [P.O.:180; I.V.:860.4] Out: 650 [Urine:650]   Intake/Output      11/18 0701 - 11/19 0700   P.O. 180   I.V. (mL/kg) 2953.2 (28.7)   IV Piggyback 400   Total Intake(mL/kg) 3533.2 (34.3)   Urine (mL/kg/hr) 2175 (0.9)   Stool 0   Blood 20   Total Output 2195   Net +1338.2       Stool Occurrence 0 x      LABORATORY DATA: Recent Labs    08/26/18 1518 09/02/18 0453  WBC 5.6 9.5  HGB 12.4 10.5*  HCT 37.6 32.0*  PLT 183 176   Recent Labs    08/26/18 1518 09/02/18 0453  NA 134* 134*  K 5.1 4.4  CL 100 103  CO2 26 21*  BUN 19 13  CREATININE 0.73 0.58  GLUCOSE 106* 135*  CALCIUM 9.7 8.7*   Lab Results  Component Value Date   INR 1.6 03/05/2016   INR 1.4 02/20/2016   INR 1.9 01/23/2016    Examination:  General appearance: alert, cooperative and no distress Extremities: extremities normal, atraumatic, no cyanosis or edema  Wound Exam: clean, dry, intact   Drainage:  None: wound tissue dry  Motor Exam: Quadriceps and Hamstrings Intact  Sensory Exam: Superficial Peroneal, Deep Peroneal and Tibial normal   Assessment:    1 Day Post-Op  Procedure(s) (LRB): RIGHT TOTAL KNEE ARTHROPLASTY (Right)  ADDITIONAL DIAGNOSIS:  Active Problems:   S/P total knee replacement     Plan: Physical Therapy as  ordered Weight Bearing as Tolerated (WBAT)  DVT Prophylaxis:  Xarelto  DISCHARGE PLAN: Home  DISCHARGE NEEDS: HHPT     Patient doing great, expected D/C home today  Patient's anticipated LOS is less than 2 midnights, meeting these requirements: - Lives within 1 hour of care - Has a competent adult at home to recover with post-op recover - NO history of  - Chronic pain requiring opiods  - Diabetes  - Coronary Artery Disease  - Heart failure  - Heart attack  - Stroke  - DVT/VTE  - Cardiac arrhythmia  - Respiratory Failure/COPD  - Renal failure  - Anemia  - Advanced Liver disease        Donia Ast 09/02/2018, 6:57 AM

## 2018-09-02 NOTE — Discharge Summary (Signed)
SPORTS MEDICINE & JOINT REPLACEMENT   Amanda Mulch, MD   Amanda Shadow, PA-C [place], [place], [place]  [address]                             [phone]  PATIENT ID: Amanda Wilkins        MRN:  [id number]          DOB/AGE: 1946-03-20 / 72 y.o.    DISCHARGE SUMMARY  ADMISSION DATE:    09/01/2018 DISCHARGE DATE:   09/02/2018   ADMISSION DIAGNOSIS: primary osteoarthritis right knee    DISCHARGE DIAGNOSIS:  primary osteoarthritis right knee    ADDITIONAL DIAGNOSIS: Active Problems:   S/P total knee replacement  Past Medical History:  Diagnosis Date  . Anticoagulant long-term use    Xarelto  . Chronic interstitial nephritis   . Depression   . History of interstitial nephritis 2016   chronic  . History of nuclear stress test 12/16/2014   Intermediate risk nuclear study w/ medium size moderate severity reversible defect in the basal and mid inferolateral and inferior wall (per dr cardiology note , dr [name] did not think this was consistent with ischemia)/  normal LV function and wall motion, ef 75%  . History of septic shock 01/22/2015   in setting Group A Strep Cellulits erysipelas/chest wall induration with streptoccocus basterium-- Severe sepsis, DIC, Acute respiratory failure with pulmonary edema, Acute Kidney failure with chronic interstitial nephritis  . Hypertension   . Hyponatremia   . Migraines    on Zoloft for migraines  . Mild carotid artery disease ([place])    per duplex 08-30-2017 bilateral ICA 1-39%  . OA (osteoarthritis) rheumotologist-  dr [name]   both knees,  shoulders, ankles  . OSA on CPAP     moderate obstructive sleep apnea with an AHI of 23.4/h and oxygen desaturations as low as 84%.  Now on CPAP at 12 cm H2O.  [name] PAF (paroxysmal atrial fibrillation) [facility]) 2009   cardiologist-- dr [name]  . Paroxysmal atrial flutter ([place])    a. dx 11/2017.  [name] PSVT (paroxysmal supraventricular tachycardia) ([place])   . Wears contact lenses      PROCEDURE: Procedure(s): RIGHT TOTAL KNEE ARTHROPLASTY on 09/01/2018  CONSULTS:    HISTORY:  See H&P in chart  HOSPITAL COURSE:  Amanda Wilkins is a 72 y.o. admitted on 09/01/2018 and found to have a diagnosis of primary osteoarthritis right knee.  After appropriate laboratory studies were obtained  they were taken to the operating room on 09/01/2018 and underwent Procedure(s): RIGHT TOTAL KNEE ARTHROPLASTY.   They were given perioperative antibiotics:  Anti-infectives (From admission, onward)   Start     Dose/Rate Route Frequency Ordered Stop   09/02/18 1000  hydroxychloroquine (PLAQUENIL) tablet 200 mg     200 mg Oral  Every morning - 10a 09/01/18 1042     09/01/18 1400  clindamycin (CLEOCIN) IVPB 600 mg     600 mg 100 mL/hr over 30 Minutes Intravenous Every 6 hours 09/01/18 1042 09/01/18 2028   09/01/18 0600  ceFAZolin (ANCEF) IVPB 2g/100 mL premix     2 g 200 mL/hr over 30 Minutes Intravenous On call to O.R. 09/01/18 2130 09/01/18 0742    .  Patient given tranexamic acid IV or topical and exparel intra-operatively.  Tolerated the procedure well.    POD# 1: Vital signs were stable.  Patient denied Chest pain, shortness of breath, or  calf pain.  Patient was started on Aspirin twice daily at 8am.  Consults to PT, OT, and care management were made.  The patient was weight bearing as tolerated.  CPM was placed on the operative leg 0-90 degrees for 6-8 hours a day. When out of the CPM, patient was placed in the foam block to achieve full extension. Incentive spirometry was taught.  Dressing was changed.       POD #2, Continued  PT for ambulation and exercise program.  IV saline locked.  O2 discontinued.    The remainder of the hospital course was dedicated to ambulation and strengthening.   The patient was discharged on 1 Day Post-Op in  Good condition.  Blood products given:none  DIAGNOSTIC STUDIES: Recent vital signs:  Patient Vitals for the past 24 hrs:  BP Temp  Temp src Pulse Resp SpO2  09/02/18 1056 120/83 (!) 97.4 F (36.3 C) Oral 75 16 100 %  09/02/18 0900 139/61 98 F (36.7 C) Oral 77 - -  09/02/18 0623 (!) 143/69 97.9 F (36.6 C) Oral 73 - 100 %  09/02/18 0203 125/69 97.8 F (36.6 C) Oral 74 16 99 %  09/01/18 2108 129/62 98.4 F (36.9 C) Oral 74 15 99 %  09/01/18 1718 116/66 97.6 F (36.4 C) Oral 77 16 99 %  09/01/18 1344 121/66 97.7 F (36.5 C) Oral 69 16 98 %  09/01/18 1240 121/70 97.7 F (36.5 C) Oral 72 16 100 %       Recent laboratory studies: Recent Labs    08/26/18 1518 09/02/18 0453  WBC 5.6 9.5  HGB 12.4 10.5*  HCT 37.6 32.0*  PLT 183 176   Recent Labs    08/26/18 1518 09/02/18 0453  NA 134* 134*  K 5.1 4.4  CL 100 103  CO2 26 21*  BUN 19 13  CREATININE 0.73 0.58  GLUCOSE 106* 135*  CALCIUM 9.7 8.7*   Lab Results  Component Value Date   INR 1.6 03/05/2016   INR 1.4 02/20/2016   INR 1.9 01/23/2016     Recent Radiographic Studies :  No results found.  DISCHARGE INSTRUCTIONS: Discharge Instructions    Call MD / Call 911   Complete by:  As directed    If you experience chest pain or shortness of breath, CALL 911 and be transported to the hospital emergency room.  If you develope a fever above 101 F, pus (white drainage) or increased drainage or redness at the wound, or calf pain, call your surgeon's office.   Constipation Prevention   Complete by:  As directed    Drink plenty of fluids.  Prune juice may be helpful.  You may use a stool softener, such as Colace (over the counter) 100 mg twice a day.  Use MiraLax (over the counter) for constipation as needed.   Diet - low sodium heart healthy   Complete by:  As directed    Discharge instructions   Complete by:  As directed    INSTRUCTIONS AFTER JOINT REPLACEMENT   Remove items at home which could result in a fall. This includes throw rugs or furniture in walking pathways ICE to the affected joint every three hours while awake for 30 minutes at a  time, for at least the first 3-5 days, and then as needed for pain and swelling.  Continue to use ice for pain and swelling. You may notice swelling that will progress down to the foot and ankle.  This is normal after  surgery.  Elevate your leg when you are not up walking on it.   Continue to use the breathing machine you got in the hospital (incentive spirometer) which will help keep your temperature down.  It is common for your temperature to cycle up and down following surgery, especially at night when you are not up moving around and exerting yourself.  The breathing machine keeps your lungs expanded and your temperature down.   DIET:  As you were doing prior to hospitalization, we recommend a well-balanced diet.  DRESSING / WOUND CARE / SHOWERING  Keep the surgical dressing until follow up.  The dressing is water proof, so you can shower without any extra covering.  IF THE DRESSING FALLS OFF or the wound gets wet inside, change the dressing with sterile gauze.  Please use good hand washing techniques before changing the dressing.  Do not use any lotions or creams on the incision until instructed by your surgeon.    ACTIVITY  Increase activity slowly as tolerated, but follow the weight bearing instructions below.   No driving for 6 weeks or until further direction given by your physician.  You cannot drive while taking narcotics.  No lifting or carrying greater than 10 lbs. until further directed by your surgeon. Avoid periods of inactivity such as sitting longer than an hour when not asleep. This helps prevent blood clots.  You may return to work once you are authorized by your doctor.     WEIGHT BEARING   Weight bearing as tolerated with assist device (walker, cane, etc) as directed, use it as long as suggested by your surgeon or therapist, typically at least 4-6 weeks.   EXERCISES  Results after joint replacement surgery are often greatly improved when you follow the exercise, range  of motion and muscle strengthening exercises prescribed by your doctor. Safety measures are also important to protect the joint from further injury. Any time any of these exercises cause you to have increased pain or swelling, decrease what you are doing until you are comfortable again and then slowly increase them. If you have problems or questions, call your caregiver or physical therapist for advice.   Rehabilitation is important following a joint replacement. After just a few days of immobilization, the muscles of the leg can become weakened and shrink (atrophy).  These exercises are designed to build up the tone and strength of the thigh and leg muscles and to improve motion. Often times heat used for twenty to thirty minutes before working out will loosen up your tissues and help with improving the range of motion but do not use heat for the first two weeks following surgery (sometimes heat can increase post-operative swelling).   These exercises can be done on a training (exercise) mat, on the floor, on a table or on a bed. Use whatever works the best and is most comfortable for you.    Use music or television while you are exercising so that the exercises are a pleasant break in your day. This will make your life better with the exercises acting as a break in your routine that you can look forward to.   Perform all exercises about fifteen times, three times per day or as directed.  You should exercise both the operative leg and the other leg as well.   Exercises include:   Quad Sets - Tighten up the muscle on the front of the thigh (Quad) and hold for 5-10 seconds.   Straight Leg Raises -  With your knee straight (if you were given a brace, keep it on), lift the leg to 60 degrees, hold for 3 seconds, and slowly lower the leg.  Perform this exercise against resistance later as your leg gets stronger.  Leg Slides: Lying on your back, slowly slide your foot toward your buttocks, bending your knee up  off the floor (only go as far as is comfortable). Then slowly slide your foot back down until your leg is flat on the floor again.  Angel Wings: Lying on your back spread your legs to the side as far apart as you can without causing discomfort.  Hamstring Strength:  Lying on your back, push your heel against the floor with your leg straight by tightening up the muscles of your buttocks.  Repeat, but this time bend your knee to a comfortable angle, and push your heel against the floor.  You may put a pillow under the heel to make it more comfortable if necessary.   A rehabilitation program following joint replacement surgery can speed recovery and prevent re-injury in the future due to weakened muscles. Contact your doctor or a physical therapist for more information on knee rehabilitation.    CONSTIPATION  Constipation is defined medically as fewer than three stools per week and severe constipation as less than one stool per week.  Even if you have a regular bowel pattern at home, your normal regimen is likely to be disrupted due to multiple reasons following surgery.  Combination of anesthesia, postoperative narcotics, change in appetite and fluid intake all can affect your bowels.   YOU MUST use at least one of the following options; they are listed in order of increasing strength to get the job done.  They are all available over the counter, and you may need to use some, POSSIBLY even all of these options:    Drink plenty of fluids (prune juice may be helpful) and high fiber foods Colace 100 mg by mouth twice a day  Senokot for constipation as directed and as needed Dulcolax (bisacodyl), take with full glass of water  Miralax (polyethylene glycol) once or twice a day as needed.  If you have tried all these things and are unable to have a bowel movement in the first 3-4 days after surgery call either your surgeon or your primary doctor.    If you experience loose stools or diarrhea, hold the  medications until you stool forms back up.  If your symptoms do not get better within 1 week or if they get worse, check with your doctor.  If you experience "the worst abdominal pain ever" or develop nausea or vomiting, please contact the office immediately for further recommendations for treatment.   ITCHING:  If you experience itching with your medications, try taking only a single pain pill, or even half a pain pill at a time.  You can also use Benadryl over the counter for itching or also to help with sleep.   TED HOSE STOCKINGS:  Use stockings on both legs until for at least 2 weeks or as directed by physician office. They may be removed at night for sleeping.  MEDICATIONS:  See your medication summary on the "After Visit Summary" that nursing will review with you.  You may have some home medications which will be placed on hold until you complete the course of blood thinner medication.  It is important for you to complete the blood thinner medication as prescribed.  PRECAUTIONS:  If you experience chest  pain or shortness of breath - call 911 immediately for transfer to the hospital emergency department.   If you develop a fever greater that 101 F, purulent drainage from wound, increased redness or drainage from wound, foul odor from the wound/dressing, or calf pain - CONTACT YOUR SURGEON.                                                   FOLLOW-UP APPOINTMENTS:  If you do not already have a post-op appointment, please call the office for an appointment to be seen by your surgeon.  Guidelines for how soon to be seen are listed in your "After Visit Summary", but are typically between 1-4 weeks after surgery.  OTHER INSTRUCTIONS:   Knee Replacement:  Do not place pillow under knee, focus on keeping the knee straight while resting. CPM instructions: 0-90 degrees, 2 hours in the morning, 2 hours in the afternoon, and 2 hours in the evening. Place foam block, curve side up under heel at all times  except when in CPM or when walking.  DO NOT modify, tear, cut, or change the foam block in any way.  MAKE SURE YOU:  Understand these instructions.  Get help right away if you are not doing well or get worse.    Thank you for letting us be a part of your medical care team.  It is a privilege we respect greatly.  We hope these instructions will help you stay on track for a fast and full recovery!   Increase activity slowly as tolerated   Complete by:  As directed       DISCHARGE MEDICATIONS:   Allergies as of 09/02/2018      Reactions   Epinephrine Base Other (See Comments)   Seriously increases heart rate   Aspirin Other (See Comments)   Can take the coated 325 mg. Plain asa 325 mg speeds up the heart.   Penicillins Other (See Comments)   Pt previously has been told not to take because of family history of reactions (water blisters). She took amoxicillin in 2012-2013 with no reaction   Synvisc [hylan G-f 20]    Very painful (knee injection)   Tape Other (See Comments)   Blisters from adhesive tape      Medication List    TAKE these medications   acetaminophen 500 MG tablet Commonly known as:  TYLENOL Take 1,000 mg by mouth every 8 (eight) hours as needed (pain).   atenolol 50 MG tablet Commonly known as:  TENORMIN TAKE 1 TABLET BY MOUTH EVERY DAY What changed:  when to take this   cholecalciferol 25 MCG (1000 UT) tablet Commonly known as:  VITAMIN D3 Take 1,000 Units by mouth daily.   hydroxychloroquine 200 MG tablet Commonly known as:  PLAQUENIL Take 200 mg by mouth every morning.   methocarbamol 500 MG tablet Commonly known as:  ROBAXIN Take 1-2 tablets (500-1,000 mg total) by mouth every 6 (six) hours as needed for muscle spasms.   multivitamin capsule Take 1 capsule by mouth daily.   oxyCODONE 5 MG immediate release tablet Commonly known as:  Oxy IR/ROXICODONE Take 1-2 tablets (5-10 mg total) by mouth every 6 (six) hours as needed for moderate pain (pain  score 4-6).   rivaroxaban 20 MG Tabs tablet Commonly known as:  XARELTO Take 1 tablet (20 mg  total) by mouth daily with supper.   rosuvastatin 5 MG tablet Commonly known as:  CRESTOR Take 1 tablet (5 mg total) by mouth daily. Please dispense generic. What changed:  when to take this   sertraline 50 MG tablet Commonly known as:  ZOLOFT Take 50 mg by mouth every morning.            Durable Medical Equipment  (From admission, onward)         Start     Ordered   09/01/18 1042  DME Walker rolling  Once    Question:  Patient needs a walker to treat with the following condition  Answer:  S/P total knee replacement   09/01/18 1042   09/01/18 1042  DME 3 n 1  Once     09/01/18 1042   09/01/18 1042  DME Bedside commode  Once    Question:  Patient needs a bedside commode to treat with the following condition  Answer:  S/P total knee replacement   09/01/18 1042          FOLLOW UP VISIT:   Follow-up Information    [name], MD Follow up on 09/09/2018.   Specialty:  Orthopedic Surgery Why:  2:00pm  Contact information: [place] [address] [phone]           DISPOSITION: HOME VS. SNF  CONDITION:  Good   Donia Ast 09/02/2018, 12:05 PM

## 2018-09-02 NOTE — Progress Notes (Signed)
Physical Therapy Treatment Patient Details Name: Amanda Wilkins MRN: [id number] DOB: 10-10-1946 Today's Date: 09/02/2018    History of Present Illness Pt is 72 yo female s/p R TKR on 09/01/18. PMH includes ataxia, vertigo, OA, RA, migraines, afib, OSA with CPAP, cellulitis, renal insufficiency, DOE, L eye low vision, adjustment disorder, depression, HTN, L knee arthroscopy, R shoulder arthroscopy.     PT Comments    The patient is progressing well. Patient reports the lateral 3 toas on right foot are tingling. Patient's gait is steady using RW. Plans to practice steps  When spouse present this PM.   Follow Up Recommendations  Follow surgeon's recommendation for DC plan and follow-up therapies;Supervision for mobility/OOB     Equipment Recommendations  None recommended by PT    Recommendations for Other Services       Precautions / Restrictions Precautions Precautions: Fall;Knee Restrictions Weight Bearing Restrictions: No    Mobility  Bed Mobility Overal bed mobility: Needs Assistance Bed Mobility: Supine to Sit     Supine to sit: Supervision;HOB elevated     General bed mobility comments: patient self managed right leg  Transfers Overall transfer level: Needs assistance Equipment used: Rolling walker (2 wheeled) Transfers: Sit to/from Stand Sit to Stand: Min assist         General transfer comment: cues for safety, hand and right leg position  Ambulation/Gait Ambulation/Gait assistance: Min assist Gait Distance (Feet): 40 Feet Assistive device: Rolling walker (2 wheeled) Gait Pattern/deviations: Step-to pattern;Step-through pattern Gait velocity: decr    General Gait Details: cues for sequence  and safety.   Stairs             Wheelchair Mobility    Modified Rankin (Stroke Patients Only)       Balance                                            Cognition Arousal/Alertness: Awake/alert Behavior During Therapy:  Anxious                                   General Comments: pt states" I am shakey"      Exercises Total Joint Exercises Ankle Circles/Pumps: AROM;Both;10 reps Quad Sets: AROM;Both;10 reps Hip ABduction/ADduction: AROM;Right;10 reps;Supine Long Arc Quad: AROM;Right;10 reps;Supine Goniometric ROM: 160 right knee flexion    General Comments        Pertinent Vitals/Pain Pain Score: 4  Pain Location: R knee,  Pain Descriptors / Indicators: Sore Pain Intervention(s): Monitored during session;Premedicated before session    Home Living                      Prior Function            PT Goals (current goals can now be found in the care plan section) Progress towards PT goals: Progressing toward goals    Frequency    7X/week      PT Plan Current plan remains appropriate    Co-evaluation              AM-PAC PT "6 Clicks" Daily Activity  Outcome Measure  Difficulty turning over in bed (including adjusting bedclothes, sheets and blankets)?: A Little Difficulty moving from lying on back to sitting on the side of the bed? : A Little Difficulty sitting down  on and standing up from a chair with arms (e.g., wheelchair, bedside commode, etc,.)?: A Little Help needed moving to and from a bed to chair (including a wheelchair)?: A Little Help needed walking in hospital room?: A Little Help needed climbing 3-5 steps with a railing? : A Lot 6 Click Score: 17    End of Session Equipment Utilized During Treatment: Gait belt Activity Tolerance: Patient tolerated treatment well Patient left: in chair;with call bell/phone within reach Nurse Communication: Mobility status PT Visit Diagnosis: Unsteadiness on feet (R26.81);Other abnormalities of gait and mobility (R26.89)     Time: 7356-7014 PT Time Calculation (min) (ACUTE ONLY): 38 min  Charges:  $Gait Training: 8-22 mins $Therapeutic Exercise: 8-22 mins $Self Care/Home Management: [place] Pager [phone] Office [phone]    Claretha Cooper 09/02/2018, 11:08 AM

## 2018-09-02 NOTE — Progress Notes (Signed)
   09/02/18 1505  PT treatment .   PT Visit Information  Assistance Needed +1  History of Present Illness Pt is 72 yo female s/p R TKR on 09/01/18. PMH includes ataxia, vertigo, OA, RA, migraines, afib, OSA with CPAP, cellulitis, renal insufficiency, DOE, L eye low vision, adjustment disorder, depression, HTN, L knee arthroscopy, R shoulder arthroscopy.   Precautions  Precautions Fall;Knee  Pain Assessment  Pain Location R knee,   Pain Descriptors / Indicators Sore  Cognition  Arousal/Alertness Awake/alert  Bed Mobility  Bed Mobility Supine to Sit;Sit to Supine  Supine to sit Supervision  Sit to supine Supervision  General bed mobility comments cues for managing right leg  Transfers  Overall transfer level Needs assistance  Equipment used Rolling walker (2 wheeled)  Transfers Sit to/from Stand  Sit to Stand Supervision  General transfer comment cues for safety, hand and right leg position, tends to not reach back, not placing right leg forward  Ambulation/Gait  Ambulation/Gait assistance Min guard  Assistive device Rolling walker (2 wheeled)  Gait Pattern/deviations Step-to x 20' x 2 to BR. pattern;Step-through pattern  General Gait Details cues for sequence  and safety.  Gait velocity decr   PT - End of Session  Activity Tolerance Patient tolerated treatment well  Patient left in chair  Nurse Communication Mobility status   PT - Assessment/Plan  PT Plan Current plan remains appropriate  PT Visit Diagnosis Unsteadiness on feet (R26.81);Other abnormalities of gait and mobility (R26.89)  PT Frequency (ACUTE ONLY) 7X/week  Follow Up Recommendations Follow surgeon's recommendation for DC plan and follow-up therapies;Supervision for mobility/OOB  PT equipment None recommended by PT  AM-PAC PT "6 Clicks" Daily Activity Outcome Measure  Difficulty turning over in bed (including adjusting bedclothes, sheets and blankets)? 3  Difficulty moving from lying on back to sitting on the side  of the bed?  3  Difficulty sitting down on and standing up from a chair with arms (e.g., wheelchair, bedside commode, etc,.)? 3  Help needed moving to and from a bed to chair (including a wheelchair)? 3  Help needed walking in hospital room? 3  Help needed climbing 3-5 steps with a railing?  2  6 Click Score 17  Mobility G Code  CK  PT Goal Progression  Progress towards PT goals Progressing toward goals  PT Time Calculation  PT Start Time (ACUTE ONLY) 1356  PT Stop Time (ACUTE ONLY) 1445  PT Time Calculation (min) (ACUTE ONLY) 49 min  PT General Charges  $$ ACUTE PT VISIT 1 Visit  Tresa Endo PT Acute Rehabilitation Services Pager [phone] Office [phone]

## 2018-09-02 NOTE — Progress Notes (Addendum)
Physical Therapy Treatment Patient Details Name: Amanda Wilkins MRN: [id number] DOB: 09-10-1946 Today's Date: 09/02/2018    History of Present Illness Pt is 72 yo female s/p R TKR on 09/01/18. PMH includes ataxia, vertigo, OA, RA, migraines, afib, OSA with CPAP, cellulitis, renal insufficiency, DOE, L eye low vision, adjustment disorder, depression, HTN, L knee arthroscopy, R shoulder arthroscopy.     PT Comments    patient practiced steps with spouse./ ready for DC.  Follow Up Recommendations  Follow surgeon's recommendation for DC plan and follow-up therapies;Supervision for mobility/OOB     Equipment Recommendations  None recommended by PT    Recommendations for Other Services       Precautions / Restrictions Precautions Precautions: Fall;Knee    Mobility  Bed Mobility   Bed Mobility: Supine to Sit;Sit to Supine     Supine to sit: Supervision Sit to supine: Supervision   General bed mobility comments: in bed.  Transfers Overall transfer level: Needs assistance Equipment used: Rolling walker (2 wheeled) Transfers: Sit to/from Stand Sit to Stand: Supervision         General transfer comment: cues for safety, hand and right leg position, tends to not reach back, not placing right leg forward  Ambulation/Gait Ambulation/Gait assistance: Min guard Gait Distance (Feet): 20 Feet Assistive device: Rolling walker (2 wheeled) Gait Pattern/deviations: Step-to pattern;Step-through pattern Gait velocity: decr    General Gait Details: cues for sequence  and safety.   Stairs Stairs: Yes Stairs assistance: Min assist Stair Management: One rail Right;Forwards;With crutches Number of Stairs: 4 General stair comments: spouse present to assist.   Wheelchair Mobility    Modified Rankin (Stroke Patients Only)       Balance                                            Cognition Arousal/Alertness: Awake/alert                                             Exercises      General Comments        Pertinent Vitals/Pain Pain Score: 5  Pain Location: R knee,  Pain Descriptors / Indicators: Sore Pain Intervention(s): Monitored during session    Home Living                      Prior Function            PT Goals (current goals can now be found in the care plan section) Progress towards PT goals: Progressing toward goals    Frequency    7X/week      PT Plan Current plan remains appropriate    Co-evaluation              AM-PAC PT "6 Clicks" Daily Activity  Outcome Measure  Difficulty turning over in bed (including adjusting bedclothes, sheets and blankets)?: A Little Difficulty moving from lying on back to sitting on the side of the bed? : A Little Difficulty sitting down on and standing up from a chair with arms (e.g., wheelchair, bedside commode, etc,.)?: A Little Help needed moving to and from a bed to chair (including a wheelchair)?: A Little Help needed walking in hospital room?: A Lot Help needed climbing 3-5 steps  with a railing? : A Lot 6 Click Score: 16    End of Session   Activity Tolerance: Patient tolerated treatment well Patient left: in bed;with family/visitor present Nurse Communication: Mobility status PT Visit Diagnosis: Unsteadiness on feet (R26.81);Other abnormalities of gait and mobility (R26.89)     Time: 1425-1445 PT Time Calculation (min) (ACUTE ONLY): 20 min  Charges:  $Gait Training: 8-22 mins                     [place] Pager [phone] Office [phone]     Claretha Cooper 09/02/2018, 5:33 PM

## 2018-09-03 ENCOUNTER — Telehealth: Payer: Self-pay | Admitting: *Deleted

## 2018-09-03 DIAGNOSIS — I471 Supraventricular tachycardia: Secondary | ICD-10-CM | POA: Diagnosis not present

## 2018-09-03 DIAGNOSIS — Z9181 History of falling: Secondary | ICD-10-CM | POA: Diagnosis not present

## 2018-09-03 DIAGNOSIS — Z96651 Presence of right artificial knee joint: Secondary | ICD-10-CM | POA: Diagnosis not present

## 2018-09-03 DIAGNOSIS — M19011 Primary osteoarthritis, right shoulder: Secondary | ICD-10-CM | POA: Diagnosis not present

## 2018-09-03 DIAGNOSIS — G43909 Migraine, unspecified, not intractable, without status migrainosus: Secondary | ICD-10-CM | POA: Diagnosis not present

## 2018-09-03 DIAGNOSIS — M19071 Primary osteoarthritis, right ankle and foot: Secondary | ICD-10-CM | POA: Diagnosis not present

## 2018-09-03 DIAGNOSIS — I10 Essential (primary) hypertension: Secondary | ICD-10-CM | POA: Diagnosis not present

## 2018-09-03 DIAGNOSIS — I480 Paroxysmal atrial fibrillation: Secondary | ICD-10-CM | POA: Diagnosis not present

## 2018-09-03 DIAGNOSIS — I4892 Unspecified atrial flutter: Secondary | ICD-10-CM | POA: Diagnosis not present

## 2018-09-03 DIAGNOSIS — M1712 Unilateral primary osteoarthritis, left knee: Secondary | ICD-10-CM | POA: Diagnosis not present

## 2018-09-03 DIAGNOSIS — Z7901 Long term (current) use of anticoagulants: Secondary | ICD-10-CM | POA: Diagnosis not present

## 2018-09-03 DIAGNOSIS — Z471 Aftercare following joint replacement surgery: Secondary | ICD-10-CM | POA: Diagnosis not present

## 2018-09-03 DIAGNOSIS — M19012 Primary osteoarthritis, left shoulder: Secondary | ICD-10-CM | POA: Diagnosis not present

## 2018-09-03 DIAGNOSIS — M19072 Primary osteoarthritis, left ankle and foot: Secondary | ICD-10-CM | POA: Diagnosis not present

## 2018-09-03 DIAGNOSIS — F329 Major depressive disorder, single episode, unspecified: Secondary | ICD-10-CM | POA: Diagnosis not present

## 2018-09-03 NOTE — Op Note (Signed)
TOTAL KNEE REPLACEMENT OPERATIVE NOTE:  09/01/2018  6:24 AM  PATIENT:  Amanda Wilkins  72 y.o. female  PRE-OPERATIVE DIAGNOSIS:  primary osteoarthritis right knee  POST-OPERATIVE DIAGNOSIS:  primary osteoarthritis right knee  PROCEDURE:  Procedure(s): RIGHT TOTAL KNEE ARTHROPLASTY  SURGEON:  Surgeon(s): [name], MD  PHYSICIAN ASSISTANT: Carlyon Shadow, PA-C   ANESTHESIA:   spinal  SPECIMEN: None  COUNTS:  Correct  TOURNIQUET:   Total Tourniquet Time Documented: Thigh (Right) - 36 minutes Total: Thigh (Right) - 36 minutes   DICTATION:  Indication for procedure:    The patient is a 72 y.o. female who has failed conservative treatment for primary osteoarthritis right knee.  Informed consent was obtained prior to anesthesia. The risks versus benefits of the operation were explain and in a way the patient can, and did, understand.   On the implant demand matching protocol, this patient scored 10.  Therefore, this patient was not receive a polyethylene insert with vitamin E which is a high demand implant.  Description of procedure:     The patient was taken to the operating room and placed under anesthesia.  The patient was positioned in the usual fashion taking care that all body parts were adequately padded and/or protected.  A tourniquet was applied and the leg prepped and draped in the usual sterile fashion.  The extremity was exsanguinated with the esmarch and tourniquet inflated to 350 mmHg.  Pre-operative range of motion was normal.  The knee was in 5 degree of mild varus.  A midline incision approximately 6-7 inches long was made with a #10 blade.  A new blade was used to make a parapatellar arthrotomy going 2-3 cm into the quadriceps tendon, over the patella, and alongside the medial aspect of the patellar tendon.  A synovectomy was then performed with the #10 blade and forceps. I then elevated the deep MCL off the medial tibial metaphysis subperiosteally  around to the semimembranosus attachment.    I everted the patella and used calipers to measure patellar thickness.  I used the reamer to ream down to appropriate thickness to recreate the native thickness.  I then removed excess bone with the rongeur and sagittal saw.  I used the appropriately sized template and drilled the three lug holes.  I then put the trial in place and measured the thickness with the calipers to ensure recreation of the native thickness.  The trial was then removed and the patella subluxed and the knee brought into flexion.  A homan retractor was place to retract and protect the patella and lateral structures.  A Z-retractor was place medially to protect the medial structures.  The extra-medullary alignment system was used to make cut the tibial articular surface perpendicular to the anamotic axis of the tibia and in 3 degrees of posterior slope.  The cut surface and alignment jig was removed.  I then used the intramedullary alignment guide to make a 6 valgus cut on the distal femur.  I then marked out the epicondylar axis on the distal femur.  The posterior condylar axis measured 3 degrees.  I then used the anterior referencing sizer and measured the femur to be a size 7.  The 4-In-1 cutting block was screwed into place in external rotation matching the posterior condylar angle, making our cuts perpendicular to the epicondylar axis.  Anterior, posterior and chamfer cuts were made with the sagittal saw.  The cutting block and cut pieces were removed.  A lamina spreader was placed in 90  degrees of flexion.  The ACL, PCL, menisci, and posterior condylar osteophytes were removed.  A 10 mm spacer blocked was found to offer good flexion and extension gap balance after mild in degree releasing.   The scoop retractor was then placed and the femoral finishing block was pinned in place.  The small sagittal saw was used as well as the lug drill to finish the femur.  The block and cut surfaces  were removed and the medullary canal hole filled with autograft bone from the cut pieces.  The tibia was delivered forward in deep flexion and external rotation.  A size C tray was selected and pinned into place centered on the medial 1/3 of the tibial tubercle.  The reamer and keel was used to prepare the tibia through the tray.    I then trialed with the size 7 femur, size C tibia, a 10 mm insert and the 32 patella.  I had excellent flexion/extension gap balance, excellent patella tracking.  Flexion was full and beyond 120 degrees; extension was zero.  These components were chosen and the staff opened them to me on the back table while the knee was lavaged copiously and the cement mixed.  The soft tissue was infiltrated with 60cc of exparel 1.3% through a 21 gauge needle.  I cemented in the components and removed all excess cement.  The polyethylene tibial component was snapped into place and the knee placed in extension while cement was hardening.  The capsule was infilltrated with a 60cc exparel/marcaine/saline mixture.   Once the cement was hard, the tourniquet was let down.  Hemostasis was obtained.  The arthrotomy was closed using a #1 stratofix running suture.  The deep soft tissues were closed with #0 vicryls and the subcuticular layer closed with #2-0 vicryl.  The skin was reapproximated and closed with 3.0 Monocryl.  The wound was covered with steristrips, aquacel dressing, and a TED stocking.   The patient was then awakened, extubated, and taken to the recovery room in stable condition.  BLOOD LOSS:  604VW COMPLICATIONS:  None.  PLAN OF CARE: Admit for overnight observation  PATIENT DISPOSITION:  PACU - hemodynamically stable.   Delay start of Pharmacological VTE agent (>24hrs) due to surgical blood loss or risk of bleeding:  not applicable  Please fax a copy of this op note to my office at [phone] (please only include page 1 and 2 of the Case Information op  note)

## 2018-09-03 NOTE — Telephone Encounter (Signed)
Pt admitted 09/01/18 for observation for primary osteoarthritis right knee. Pt underwent a RIGHT TOTAL KNEE ARTHROPLASTY. She D/C 09/02/18, and will follow-up w/Dr. Ronnie Derby in 2 weeks.[name] KitchenJohny Chess

## 2018-09-04 DIAGNOSIS — Z471 Aftercare following joint replacement surgery: Secondary | ICD-10-CM | POA: Diagnosis not present

## 2018-09-04 DIAGNOSIS — M19071 Primary osteoarthritis, right ankle and foot: Secondary | ICD-10-CM | POA: Diagnosis not present

## 2018-09-04 DIAGNOSIS — M19011 Primary osteoarthritis, right shoulder: Secondary | ICD-10-CM | POA: Diagnosis not present

## 2018-09-04 DIAGNOSIS — M19012 Primary osteoarthritis, left shoulder: Secondary | ICD-10-CM | POA: Diagnosis not present

## 2018-09-04 DIAGNOSIS — M19072 Primary osteoarthritis, left ankle and foot: Secondary | ICD-10-CM | POA: Diagnosis not present

## 2018-09-04 DIAGNOSIS — M1712 Unilateral primary osteoarthritis, left knee: Secondary | ICD-10-CM | POA: Diagnosis not present

## 2018-09-05 DIAGNOSIS — M19071 Primary osteoarthritis, right ankle and foot: Secondary | ICD-10-CM | POA: Diagnosis not present

## 2018-09-05 DIAGNOSIS — M1712 Unilateral primary osteoarthritis, left knee: Secondary | ICD-10-CM | POA: Diagnosis not present

## 2018-09-05 DIAGNOSIS — M19012 Primary osteoarthritis, left shoulder: Secondary | ICD-10-CM | POA: Diagnosis not present

## 2018-09-05 DIAGNOSIS — M19011 Primary osteoarthritis, right shoulder: Secondary | ICD-10-CM | POA: Diagnosis not present

## 2018-09-05 DIAGNOSIS — Z471 Aftercare following joint replacement surgery: Secondary | ICD-10-CM | POA: Diagnosis not present

## 2018-09-05 DIAGNOSIS — M19072 Primary osteoarthritis, left ankle and foot: Secondary | ICD-10-CM | POA: Diagnosis not present

## 2018-09-07 DIAGNOSIS — M19011 Primary osteoarthritis, right shoulder: Secondary | ICD-10-CM | POA: Diagnosis not present

## 2018-09-07 DIAGNOSIS — Z471 Aftercare following joint replacement surgery: Secondary | ICD-10-CM | POA: Diagnosis not present

## 2018-09-07 DIAGNOSIS — M19012 Primary osteoarthritis, left shoulder: Secondary | ICD-10-CM | POA: Diagnosis not present

## 2018-09-07 DIAGNOSIS — M1712 Unilateral primary osteoarthritis, left knee: Secondary | ICD-10-CM | POA: Diagnosis not present

## 2018-09-07 DIAGNOSIS — M19072 Primary osteoarthritis, left ankle and foot: Secondary | ICD-10-CM | POA: Diagnosis not present

## 2018-09-07 DIAGNOSIS — M19071 Primary osteoarthritis, right ankle and foot: Secondary | ICD-10-CM | POA: Diagnosis not present

## 2018-09-08 ENCOUNTER — Other Ambulatory Visit: Payer: Self-pay

## 2018-09-08 DIAGNOSIS — M19071 Primary osteoarthritis, right ankle and foot: Secondary | ICD-10-CM | POA: Diagnosis not present

## 2018-09-08 DIAGNOSIS — M1712 Unilateral primary osteoarthritis, left knee: Secondary | ICD-10-CM | POA: Diagnosis not present

## 2018-09-08 DIAGNOSIS — M19072 Primary osteoarthritis, left ankle and foot: Secondary | ICD-10-CM | POA: Diagnosis not present

## 2018-09-08 DIAGNOSIS — M19011 Primary osteoarthritis, right shoulder: Secondary | ICD-10-CM | POA: Diagnosis not present

## 2018-09-08 DIAGNOSIS — M19012 Primary osteoarthritis, left shoulder: Secondary | ICD-10-CM | POA: Diagnosis not present

## 2018-09-08 DIAGNOSIS — Z471 Aftercare following joint replacement surgery: Secondary | ICD-10-CM | POA: Diagnosis not present

## 2018-09-08 MED ORDER — RIVAROXABAN 20 MG PO TABS: 20.0000 mg | ORAL_TABLET | Freq: Every day | ORAL | 3 refills | Status: DC

## 2018-09-09 ENCOUNTER — Ambulatory Visit: Payer: Self-pay | Admitting: *Deleted

## 2018-09-09 ENCOUNTER — Telehealth: Payer: Self-pay | Admitting: Cardiology

## 2018-09-09 DIAGNOSIS — Z471 Aftercare following joint replacement surgery: Secondary | ICD-10-CM | POA: Diagnosis not present

## 2018-09-09 DIAGNOSIS — M25571 Pain in right ankle and joints of right foot: Secondary | ICD-10-CM

## 2018-09-09 DIAGNOSIS — Z96651 Presence of right artificial knee joint: Secondary | ICD-10-CM | POA: Diagnosis not present

## 2018-09-09 HISTORY — DX: Pain in right ankle and joints of right foot: M25.571

## 2018-09-09 NOTE — Telephone Encounter (Signed)
New message    Pt is calling asking for a call back. She said that she needs supplies. She said that she is totally out of everything. She said that [facility] is doing some sort of audit and it's going to be a while before she can get her things.

## 2018-09-09 NOTE — Telephone Encounter (Signed)
Amanda Wilkins with Dr. Ronnie Derby called with the patient experiencing mild dizziness at the end of her office visit with him. She has a hx of afib. Her heart rate is 84 at this time.  She does have a hx of migraines and stated that the light is bothering her eye only on the right side. normally with migraines she has problems with both eyes. She is not having any difficulty walking.  [place], flow at [facility] notified. Pt advised to go to the an urgent care to be assessed.  Routing to [facility] at [facility].  Reason for Disposition . [1] MILD dizziness (e.g., walking normally) AND [2] has NOT been evaluated by physician for this  (Exception: dizziness caused by heat exposure, sudden standing, or poor fluid intake)  Answer Assessment - Initial Assessment Questions 1. DESCRIPTION: "Describe your dizziness."     Light headed 2. LIGHTHEADED: "Do you feel lightheaded?" (e.g., somewhat faint, woozy, weak upon standing)      Yes  3. VERTIGO: "Do you feel like either you or the room is spinning or tilting?" (i.e. vertigo)     no 4. SEVERITY: "How bad is it?"  "Do you feel like you are going to faint?" "Can you stand and walk?"   - MILD - walking normally   - MODERATE - interferes with normal activities (e.g., work, school)    - SEVERE - unable to stand, requires support to walk, feels like passing out now.      mild 5. ONSET:  "When did the dizziness begin?"     Within the last 10 mins 6. AGGRAVATING FACTORS: "Does anything make it worse?" (e.g., standing, change in head position)     The light is bothering her today 7. HEART RATE: "Can you tell me your heart rate?" "How many beats in 15 seconds?"  (Note: not all patients can do this)       84 8. CAUSE: "What do you think is causing the dizziness?"    Not sure 9. RECURRENT SYMPTOM: "Have you had dizziness before?" If so, ask: "When was the last time?" "What happened that time?"     Was with the heart rate going up 10. OTHER SYMPTOMS: "Do you have any  other symptoms?" (e.g., fever, chest pain, vomiting, diarrhea, bleeding)       no 11. PREGNANCY: "Is there any chance you are pregnant?" "When was your last menstrual period?"       n/a  Protocols used: DIZZINESS [facility]

## 2018-09-09 NOTE — Telephone Encounter (Signed)
FYI

## 2018-09-10 NOTE — Telephone Encounter (Signed)
Reached out to [facility]) to inquire about patients supplies and was informed that they now have everything they need to release the patients supplies and [facility] will call the patient with the update.[name]

## 2018-09-10 NOTE — Telephone Encounter (Signed)
Noted. Thx.

## 2018-09-16 NOTE — Addendum Note (Signed)
Addendum  created 09/16/18 1531 by Janeece Riggers, MD   Intraprocedure Blocks edited, Sign clinical note

## 2018-09-18 DIAGNOSIS — M25461 Effusion, right knee: Secondary | ICD-10-CM | POA: Diagnosis not present

## 2018-09-18 DIAGNOSIS — M25561 Pain in right knee: Secondary | ICD-10-CM | POA: Diagnosis not present

## 2018-09-18 DIAGNOSIS — M25661 Stiffness of right knee, not elsewhere classified: Secondary | ICD-10-CM | POA: Diagnosis not present

## 2018-09-18 DIAGNOSIS — R262 Difficulty in walking, not elsewhere classified: Secondary | ICD-10-CM | POA: Diagnosis not present

## 2018-09-18 DIAGNOSIS — Z96651 Presence of right artificial knee joint: Secondary | ICD-10-CM | POA: Diagnosis not present

## 2018-09-22 DIAGNOSIS — Z96651 Presence of right artificial knee joint: Secondary | ICD-10-CM | POA: Diagnosis not present

## 2018-09-22 DIAGNOSIS — M25661 Stiffness of right knee, not elsewhere classified: Secondary | ICD-10-CM | POA: Diagnosis not present

## 2018-09-22 DIAGNOSIS — M25461 Effusion, right knee: Secondary | ICD-10-CM | POA: Diagnosis not present

## 2018-09-22 DIAGNOSIS — R262 Difficulty in walking, not elsewhere classified: Secondary | ICD-10-CM | POA: Diagnosis not present

## 2018-09-22 DIAGNOSIS — M25561 Pain in right knee: Secondary | ICD-10-CM | POA: Diagnosis not present

## 2018-09-24 ENCOUNTER — Ambulatory Visit: Payer: Medicare Other | Admitting: Podiatry

## 2018-09-25 DIAGNOSIS — M25561 Pain in right knee: Secondary | ICD-10-CM | POA: Diagnosis not present

## 2018-09-25 DIAGNOSIS — R262 Difficulty in walking, not elsewhere classified: Secondary | ICD-10-CM | POA: Diagnosis not present

## 2018-09-25 DIAGNOSIS — M25661 Stiffness of right knee, not elsewhere classified: Secondary | ICD-10-CM | POA: Diagnosis not present

## 2018-09-25 DIAGNOSIS — Z96651 Presence of right artificial knee joint: Secondary | ICD-10-CM | POA: Diagnosis not present

## 2018-09-25 DIAGNOSIS — M25461 Effusion, right knee: Secondary | ICD-10-CM | POA: Diagnosis not present

## 2018-09-29 DIAGNOSIS — M25561 Pain in right knee: Secondary | ICD-10-CM | POA: Diagnosis not present

## 2018-09-29 DIAGNOSIS — Z96651 Presence of right artificial knee joint: Secondary | ICD-10-CM | POA: Diagnosis not present

## 2018-09-29 DIAGNOSIS — R262 Difficulty in walking, not elsewhere classified: Secondary | ICD-10-CM | POA: Diagnosis not present

## 2018-09-29 DIAGNOSIS — M25461 Effusion, right knee: Secondary | ICD-10-CM | POA: Diagnosis not present

## 2018-09-29 DIAGNOSIS — M25661 Stiffness of right knee, not elsewhere classified: Secondary | ICD-10-CM | POA: Diagnosis not present

## 2018-10-01 ENCOUNTER — Ambulatory Visit: Payer: Medicare Other | Admitting: Podiatry

## 2018-10-02 DIAGNOSIS — R262 Difficulty in walking, not elsewhere classified: Secondary | ICD-10-CM | POA: Diagnosis not present

## 2018-10-02 DIAGNOSIS — M25461 Effusion, right knee: Secondary | ICD-10-CM | POA: Diagnosis not present

## 2018-10-02 DIAGNOSIS — Z96651 Presence of right artificial knee joint: Secondary | ICD-10-CM | POA: Diagnosis not present

## 2018-10-02 DIAGNOSIS — M25661 Stiffness of right knee, not elsewhere classified: Secondary | ICD-10-CM | POA: Diagnosis not present

## 2018-10-02 DIAGNOSIS — M25561 Pain in right knee: Secondary | ICD-10-CM | POA: Diagnosis not present

## 2018-10-06 DIAGNOSIS — R262 Difficulty in walking, not elsewhere classified: Secondary | ICD-10-CM | POA: Diagnosis not present

## 2018-10-06 DIAGNOSIS — M25661 Stiffness of right knee, not elsewhere classified: Secondary | ICD-10-CM | POA: Diagnosis not present

## 2018-10-06 DIAGNOSIS — M25461 Effusion, right knee: Secondary | ICD-10-CM | POA: Diagnosis not present

## 2018-10-06 DIAGNOSIS — Z96651 Presence of right artificial knee joint: Secondary | ICD-10-CM | POA: Diagnosis not present

## 2018-10-06 DIAGNOSIS — M25561 Pain in right knee: Secondary | ICD-10-CM | POA: Diagnosis not present

## 2018-10-10 DIAGNOSIS — M25561 Pain in right knee: Secondary | ICD-10-CM | POA: Diagnosis not present

## 2018-10-10 DIAGNOSIS — Z96651 Presence of right artificial knee joint: Secondary | ICD-10-CM | POA: Diagnosis not present

## 2018-10-10 DIAGNOSIS — M25461 Effusion, right knee: Secondary | ICD-10-CM | POA: Diagnosis not present

## 2018-10-10 DIAGNOSIS — M25661 Stiffness of right knee, not elsewhere classified: Secondary | ICD-10-CM | POA: Diagnosis not present

## 2018-10-10 DIAGNOSIS — R262 Difficulty in walking, not elsewhere classified: Secondary | ICD-10-CM | POA: Diagnosis not present

## 2018-10-13 DIAGNOSIS — M25561 Pain in right knee: Secondary | ICD-10-CM | POA: Diagnosis not present

## 2018-10-13 DIAGNOSIS — R262 Difficulty in walking, not elsewhere classified: Secondary | ICD-10-CM | POA: Diagnosis not present

## 2018-10-13 DIAGNOSIS — Z96651 Presence of right artificial knee joint: Secondary | ICD-10-CM | POA: Diagnosis not present

## 2018-10-13 DIAGNOSIS — M25661 Stiffness of right knee, not elsewhere classified: Secondary | ICD-10-CM | POA: Diagnosis not present

## 2018-10-13 DIAGNOSIS — M25461 Effusion, right knee: Secondary | ICD-10-CM | POA: Diagnosis not present

## 2018-10-15 ENCOUNTER — Encounter

## 2018-10-16 DIAGNOSIS — M25561 Pain in right knee: Secondary | ICD-10-CM | POA: Diagnosis not present

## 2018-10-16 DIAGNOSIS — R262 Difficulty in walking, not elsewhere classified: Secondary | ICD-10-CM | POA: Diagnosis not present

## 2018-10-16 DIAGNOSIS — M25661 Stiffness of right knee, not elsewhere classified: Secondary | ICD-10-CM | POA: Diagnosis not present

## 2018-10-16 DIAGNOSIS — Z96651 Presence of right artificial knee joint: Secondary | ICD-10-CM | POA: Diagnosis not present

## 2018-10-16 DIAGNOSIS — G8929 Other chronic pain: Secondary | ICD-10-CM | POA: Diagnosis not present

## 2018-10-16 DIAGNOSIS — M25461 Effusion, right knee: Secondary | ICD-10-CM | POA: Diagnosis not present

## 2018-10-21 DIAGNOSIS — M25661 Stiffness of right knee, not elsewhere classified: Secondary | ICD-10-CM | POA: Diagnosis not present

## 2018-10-21 DIAGNOSIS — R262 Difficulty in walking, not elsewhere classified: Secondary | ICD-10-CM | POA: Diagnosis not present

## 2018-10-21 DIAGNOSIS — Z96651 Presence of right artificial knee joint: Secondary | ICD-10-CM | POA: Diagnosis not present

## 2018-10-21 DIAGNOSIS — M25561 Pain in right knee: Secondary | ICD-10-CM | POA: Diagnosis not present

## 2018-10-21 DIAGNOSIS — M25461 Effusion, right knee: Secondary | ICD-10-CM | POA: Diagnosis not present

## 2018-10-24 DIAGNOSIS — M25661 Stiffness of right knee, not elsewhere classified: Secondary | ICD-10-CM | POA: Diagnosis not present

## 2018-10-24 DIAGNOSIS — M25561 Pain in right knee: Secondary | ICD-10-CM | POA: Diagnosis not present

## 2018-10-24 DIAGNOSIS — M25461 Effusion, right knee: Secondary | ICD-10-CM | POA: Diagnosis not present

## 2018-10-24 DIAGNOSIS — Z96651 Presence of right artificial knee joint: Secondary | ICD-10-CM | POA: Diagnosis not present

## 2018-10-24 DIAGNOSIS — G8929 Other chronic pain: Secondary | ICD-10-CM | POA: Diagnosis not present

## 2018-10-24 DIAGNOSIS — R262 Difficulty in walking, not elsewhere classified: Secondary | ICD-10-CM | POA: Diagnosis not present

## 2018-10-27 DIAGNOSIS — G8929 Other chronic pain: Secondary | ICD-10-CM | POA: Diagnosis not present

## 2018-10-27 DIAGNOSIS — M25561 Pain in right knee: Secondary | ICD-10-CM | POA: Diagnosis not present

## 2018-10-27 DIAGNOSIS — M25461 Effusion, right knee: Secondary | ICD-10-CM | POA: Diagnosis not present

## 2018-10-27 DIAGNOSIS — M25661 Stiffness of right knee, not elsewhere classified: Secondary | ICD-10-CM | POA: Diagnosis not present

## 2018-10-27 DIAGNOSIS — R262 Difficulty in walking, not elsewhere classified: Secondary | ICD-10-CM | POA: Diagnosis not present

## 2018-10-27 DIAGNOSIS — Z96651 Presence of right artificial knee joint: Secondary | ICD-10-CM | POA: Diagnosis not present

## 2018-10-30 DIAGNOSIS — M25661 Stiffness of right knee, not elsewhere classified: Secondary | ICD-10-CM | POA: Diagnosis not present

## 2018-10-30 DIAGNOSIS — R262 Difficulty in walking, not elsewhere classified: Secondary | ICD-10-CM | POA: Diagnosis not present

## 2018-10-30 DIAGNOSIS — G8929 Other chronic pain: Secondary | ICD-10-CM | POA: Diagnosis not present

## 2018-10-30 DIAGNOSIS — Z96651 Presence of right artificial knee joint: Secondary | ICD-10-CM | POA: Diagnosis not present

## 2018-10-30 DIAGNOSIS — M25461 Effusion, right knee: Secondary | ICD-10-CM | POA: Diagnosis not present

## 2018-10-30 DIAGNOSIS — M25561 Pain in right knee: Secondary | ICD-10-CM | POA: Diagnosis not present

## 2018-11-05 DIAGNOSIS — G8929 Other chronic pain: Secondary | ICD-10-CM | POA: Diagnosis not present

## 2018-11-05 DIAGNOSIS — M25661 Stiffness of right knee, not elsewhere classified: Secondary | ICD-10-CM | POA: Diagnosis not present

## 2018-11-05 DIAGNOSIS — M25461 Effusion, right knee: Secondary | ICD-10-CM | POA: Diagnosis not present

## 2018-11-05 DIAGNOSIS — Z96651 Presence of right artificial knee joint: Secondary | ICD-10-CM | POA: Diagnosis not present

## 2018-11-05 DIAGNOSIS — L245 Irritant contact dermatitis due to other chemical products: Secondary | ICD-10-CM | POA: Diagnosis not present

## 2018-11-05 DIAGNOSIS — R262 Difficulty in walking, not elsewhere classified: Secondary | ICD-10-CM | POA: Diagnosis not present

## 2018-11-05 DIAGNOSIS — M25561 Pain in right knee: Secondary | ICD-10-CM | POA: Diagnosis not present

## 2018-11-17 DIAGNOSIS — Z6835 Body mass index (BMI) 35.0-35.9, adult: Secondary | ICD-10-CM | POA: Diagnosis not present

## 2018-11-17 DIAGNOSIS — E669 Obesity, unspecified: Secondary | ICD-10-CM | POA: Diagnosis not present

## 2018-11-17 DIAGNOSIS — Z79899 Other long term (current) drug therapy: Secondary | ICD-10-CM | POA: Diagnosis not present

## 2018-11-17 DIAGNOSIS — M2550 Pain in unspecified joint: Secondary | ICD-10-CM | POA: Diagnosis not present

## 2018-11-17 DIAGNOSIS — M170 Bilateral primary osteoarthritis of knee: Secondary | ICD-10-CM | POA: Diagnosis not present

## 2018-11-17 DIAGNOSIS — M0609 Rheumatoid arthritis without rheumatoid factor, multiple sites: Secondary | ICD-10-CM | POA: Diagnosis not present

## 2018-11-19 DIAGNOSIS — M25561 Pain in right knee: Secondary | ICD-10-CM | POA: Diagnosis not present

## 2018-11-19 DIAGNOSIS — M25661 Stiffness of right knee, not elsewhere classified: Secondary | ICD-10-CM | POA: Diagnosis not present

## 2018-11-19 DIAGNOSIS — Z96651 Presence of right artificial knee joint: Secondary | ICD-10-CM | POA: Diagnosis not present

## 2018-11-19 DIAGNOSIS — M25461 Effusion, right knee: Secondary | ICD-10-CM | POA: Diagnosis not present

## 2018-11-19 DIAGNOSIS — G8929 Other chronic pain: Secondary | ICD-10-CM | POA: Diagnosis not present

## 2018-11-19 DIAGNOSIS — R262 Difficulty in walking, not elsewhere classified: Secondary | ICD-10-CM | POA: Diagnosis not present

## 2018-11-26 DIAGNOSIS — R262 Difficulty in walking, not elsewhere classified: Secondary | ICD-10-CM | POA: Diagnosis not present

## 2018-11-26 DIAGNOSIS — Z96651 Presence of right artificial knee joint: Secondary | ICD-10-CM | POA: Diagnosis not present

## 2018-11-26 DIAGNOSIS — M25461 Effusion, right knee: Secondary | ICD-10-CM | POA: Diagnosis not present

## 2018-11-26 DIAGNOSIS — M25561 Pain in right knee: Secondary | ICD-10-CM | POA: Diagnosis not present

## 2018-11-26 DIAGNOSIS — G8929 Other chronic pain: Secondary | ICD-10-CM | POA: Diagnosis not present

## 2018-11-26 DIAGNOSIS — M25661 Stiffness of right knee, not elsewhere classified: Secondary | ICD-10-CM | POA: Diagnosis not present

## 2018-12-02 DIAGNOSIS — M25661 Stiffness of right knee, not elsewhere classified: Secondary | ICD-10-CM | POA: Diagnosis not present

## 2018-12-02 DIAGNOSIS — R262 Difficulty in walking, not elsewhere classified: Secondary | ICD-10-CM | POA: Diagnosis not present

## 2018-12-02 DIAGNOSIS — G8929 Other chronic pain: Secondary | ICD-10-CM | POA: Diagnosis not present

## 2018-12-02 DIAGNOSIS — Z96651 Presence of right artificial knee joint: Secondary | ICD-10-CM | POA: Diagnosis not present

## 2018-12-02 DIAGNOSIS — M25561 Pain in right knee: Secondary | ICD-10-CM | POA: Diagnosis not present

## 2018-12-02 DIAGNOSIS — M25461 Effusion, right knee: Secondary | ICD-10-CM | POA: Diagnosis not present

## 2018-12-04 DIAGNOSIS — L245 Irritant contact dermatitis due to other chemical products: Secondary | ICD-10-CM | POA: Diagnosis not present

## 2018-12-08 ENCOUNTER — Ambulatory Visit (INDEPENDENT_AMBULATORY_CARE_PROVIDER_SITE_OTHER): Payer: Medicare Other | Admitting: Podiatry

## 2018-12-08 ENCOUNTER — Encounter: Payer: Self-pay | Admitting: Podiatry

## 2018-12-08 ENCOUNTER — Ambulatory Visit (INDEPENDENT_AMBULATORY_CARE_PROVIDER_SITE_OTHER): Payer: Medicare Other

## 2018-12-08 DIAGNOSIS — M722 Plantar fascial fibromatosis: Secondary | ICD-10-CM

## 2018-12-08 DIAGNOSIS — M84374A Stress fracture, right foot, initial encounter for fracture: Secondary | ICD-10-CM

## 2018-12-08 MED ORDER — TRIAMCINOLONE ACETONIDE 10 MG/ML IJ SUSP
10.0000 mg | Freq: Once | INTRAMUSCULAR | Status: AC
  Administered 2018-12-08: 10 mg

## 2018-12-08 NOTE — Patient Instructions (Signed)

## 2018-12-09 ENCOUNTER — Other Ambulatory Visit: Payer: Self-pay | Admitting: Internal Medicine

## 2018-12-09 NOTE — Progress Notes (Signed)
Subjective:   Patient ID: Amanda Wilkins, female   DOB: 73 y.o.   MRN: [id number]   HPI Patient states she is developed severe discomfort in the bottom of her right heel where she is concerned about this.  Patient states that she had knee replacement in November feels like she is been walking differently and has had some swelling in her leg.  Patient does not smoke likes to be active   Review of Systems  All other systems reviewed and are negative.       Objective:  Physical Exam Vitals signs and nursing note reviewed.  Constitutional:      Appearance: She is well-developed.  Pulmonary:     Effort: Pulmonary effort is normal.  Musculoskeletal: Normal range of motion.  Skin:    General: Skin is warm.  Neurological:     Mental Status: She is alert.     Neurovascular status found to be intact muscle strength is adequate with negative Homans sign noted.  Patient does have exquisite discomfort plantar aspect right heel and mild discomfort within the heel bone itself but localized.  The pain is for the most part plantar intense.  Patient has good digital perfusion well oriented     Assessment:  Most likely has acute plantar fasciitis but I cannot rule out the possibility of mild stress fracture     Plan:  H&P and educated on both conditions and discussed.  At this point will get a carefully inject the plantar heel and we may need to immobilize but it is very difficult to immobilize her due to her knee surgery.  I did a sterile prep of the medial side I injected the plantar fascia 3 mg Kenalog 5 and Xylocaine advised on reduced activity supportive shoes and reappoint 2 weeks or earlier if symptoms do not reduce  X-rays indicated plantar spur and some irritation within the calcaneus itself but do not think most likely this is a stress fracture but will need to be watched

## 2018-12-12 ENCOUNTER — Ambulatory Visit (INDEPENDENT_AMBULATORY_CARE_PROVIDER_SITE_OTHER): Payer: Medicare Other | Admitting: Internal Medicine

## 2018-12-12 ENCOUNTER — Encounter: Payer: Self-pay | Admitting: Podiatry

## 2018-12-12 ENCOUNTER — Ambulatory Visit (INDEPENDENT_AMBULATORY_CARE_PROVIDER_SITE_OTHER): Payer: Medicare Other | Admitting: Podiatry

## 2018-12-12 ENCOUNTER — Encounter: Payer: Self-pay | Admitting: Internal Medicine

## 2018-12-12 VITALS — BP 120/64 | HR 70 | Ht 65.0 in | Wt 223.6 lb

## 2018-12-12 DIAGNOSIS — M722 Plantar fascial fibromatosis: Secondary | ICD-10-CM

## 2018-12-12 DIAGNOSIS — I10 Essential (primary) hypertension: Secondary | ICD-10-CM | POA: Diagnosis not present

## 2018-12-12 DIAGNOSIS — E782 Mixed hyperlipidemia: Secondary | ICD-10-CM

## 2018-12-12 DIAGNOSIS — I679 Cerebrovascular disease, unspecified: Secondary | ICD-10-CM | POA: Diagnosis not present

## 2018-12-12 DIAGNOSIS — M84374A Stress fracture, right foot, initial encounter for fracture: Secondary | ICD-10-CM | POA: Diagnosis not present

## 2018-12-12 DIAGNOSIS — I480 Paroxysmal atrial fibrillation: Secondary | ICD-10-CM | POA: Diagnosis not present

## 2018-12-12 NOTE — Progress Notes (Signed)
Cardiology Office Note   Date:  12/12/2018   ID:  Amanda Wilkins, DOB 06-Jun-1946, MRN [id number]  PCP:  [name], MD  Cardiologist:   [name], MD    F/u of PAF   History of Present Illness: Amanda Wilkins is a 73 y.o. female with a history of CP and PAF  ON anticoagulatoin  Echo normal LVEF April 2040m  Myovue showed inferolateral and inferior defect consistent with ischemia  CT declined by insurance.   Mild plaaquing on carotid arteries   Near syncope in past   Holter in Jan showed atrial fib / SVT  Not always sensed  Flecainide 50 bid recommended but the pt did not take. In Feb 2019 she ha d atrial flutter and reverte dto SR at time      She has been seen by several APPs in 2019   She was last seen in by Amanda Wilkins in November 2019  She had sensation of heart pounding  Lasted all day   It was recomm that she take an extra metorplol if that happened     Since seen she says she notes rare palpitations    Breathing is OK   No CP      Current Meds  Medication Sig  . acetaminophen (TYLENOL) 500 MG tablet Take 1,000 mg by mouth every 8 (eight) hours as needed (pain).  [name] atenolol (TENORMIN) 50 MG tablet TAKE 1 TABLET BY MOUTH EVERY DAY (Patient taking differently: Take 50 mg by mouth every morning. )  . cholecalciferol (VITAMIN D3) 25 MCG (1000 UT) tablet Take 1,000 Units by mouth daily.  . clindamycin (CLEOCIN) 300 MG capsule TK 2 CS PO 30 MINUTES PRIOR TO DENTAL WORK  . desonide (DESOWEN) 0.05 % ointment   . hydroxychloroquine (PLAQUENIL) 200 MG tablet Take 200 mg by mouth every morning.   . Multiple Vitamin (MULTIVITAMIN) capsule Take 1 capsule by mouth daily.  . rivaroxaban (XARELTO) 20 MG TABS tablet Take 1 tablet (20 mg total) by mouth daily with supper.  . rosuvastatin (CRESTOR) 5 MG tablet TAKE 1 TABLET BY MOUTH DAILY  . sertraline (ZOLOFT) 50 MG tablet Take 50 mg by mouth every morning.      Allergies:   Epinephrine base; Aspirin; Penicillins;  Synvisc [hylan g-f 20]; and Tape   Past Medical History:  Diagnosis Date  . Anticoagulant long-term use    Xarelto  . Chronic interstitial nephritis   . Depression   . History of interstitial nephritis 2016   chronic  . History of nuclear stress test 12/16/2014   Intermediate risk nuclear study w/ medium size moderate severity reversible defect in the basal and mid inferolateral and inferior wall (per dr cardiology note , dr [name] did not think this was consistent with ischemia)/  normal LV function and wall motion, ef 75%  . History of septic shock 01/22/2015   in setting Group A Strep Cellulits erysipelas/chest wall induration with streptoccocus basterium-- Severe sepsis, DIC, Acute respiratory failure with pulmonary edema, Acute Kidney failure with chronic interstitial nephritis  . Hypertension   . Hyponatremia   . Migraines    on Zoloft for migraines  . Mild carotid artery disease ([place])    per duplex 08-30-2017 bilateral ICA 1-39%  . OA (osteoarthritis) rheumotologist-  dr [name]   both knees,  shoulders, ankles  . OSA on CPAP     moderate obstructive sleep apnea with an AHI of 23.4/h and oxygen desaturations as low as 84%.  Now on CPAP at 12 cm H2O.  [name] PAF (paroxysmal atrial fibrillation) [facility]) 2009   cardiologist-- dr [name]  . Paroxysmal atrial flutter ([place])    a. dx 11/2017.  [name] PSVT (paroxysmal supraventricular tachycardia) ([place])   . Wears contact lenses     Past Surgical History:  Procedure Laterality Date  . CESAREAN SECTION  1980  . ESOPHAGOGASTRODUODENOSCOPY N/A 02/08/2015   Procedure: ESOPHAGOGASTRODUODENOSCOPY (EGD);  Surgeon: [name], MD;  Location: [place];  Service: Endoscopy;  Laterality: N/A;  . KNEE ARTHROSCOPY W/ MENISCAL REPAIR Left 08/2014    @[facility]   . SHOULDER SURGERY Right 04/04/2016  . TOTAL KNEE ARTHROPLASTY Right 09/01/2018   Procedure: RIGHT TOTAL KNEE ARTHROPLASTY;  Surgeon: [name], MD;  Location: [facility];  Service:  Orthopedics;  Laterality: Right;     Social History:  The patient  reports that she has never smoked. She has never used smokeless tobacco. She reports current alcohol use. She reports that she does not use drugs.   Family History:  The patient's family history includes Breast cancer (age of onset: 63) in her sister; Lymphoma in her mother; Pancreatic cancer in her brother; Prostate cancer in her father.    ROS:  Please see the history of present illness. All other systems are reviewed and  Negative to the above problem except as noted.    PHYSICAL EXAM: VS:  BP 120/64   Pulse 70   Ht 5\' 5"  (1.651 m)   Wt 223 lb 9.6 oz (101.4 kg)   SpO2 98%   BMI 37.21 kg/m   GEN: Well nourished, well developed, in no acute distress  HEENT: normal  Neck: JVP is normal  No carotid bruits, or masses Cardiac: RRR; no murmurs, rubs, or gallops,no edema  Respiratory:  clear to auscultation bilaterally, normal work of breathing GI: soft, nontender, nondistended, + BS  No hepatomegaly  MS: no deformity Moving all extremities   Skin: warm and dry, no rash Neuro:  Strength and sensation are intact Psych: euthymic mood, full affect   EKG:  EKG is not  ordered today.   Lipid Panel    Component Value Date/Time   CHOL 167 05/28/2018 0837   TRIG 69.0 05/28/2018 0837   HDL 98.90 05/28/2018 0837   CHOLHDL 2 05/28/2018 0837   VLDL 13.8 05/28/2018 0837   LDLCALC 54 05/28/2018 0837      Wt Readings from Last 3 Encounters:  12/12/18 223 lb 9.6 oz (101.4 kg)  09/01/18 227 lb (103 kg)  08/28/18 227 lb (103 kg)      ASSESSMENT AND PLAN:  1 PAF  Clinically in SR   Continue current regine    2  HTN  BP is OK     3  CV dz  Has USN sched in Dec of neck  4  HL  LDL in Aug 2019 was 78   Rec she continue Crestor  5   ? CAD   Pt would like to have Ca score   FHx fo CAD   WIll shced   She understands it is out of pocket  I will plan for f/u in Oct 2020    F?U in 6 months        Current  medicines are reviewed at length with the patient today.  The patient does not have concerns regarding medicines.  Signed, [name], MD  12/12/2018 2:58 PM    [place], [place]  [address] Phone: [phone]; Fax: [phone]

## 2018-12-12 NOTE — Patient Instructions (Signed)
Medication Instructions:  No changes If you need a refill on your cardiac medications before your next appointment, please call your pharmacy.   Lab work: Today: cbc If you have labs (blood work) drawn today and your tests are completely normal, you will receive your results only by: [name] MyChart Message (if you have MyChart) OR . A paper copy in the mail If you have any lab test that is abnormal or we need to change your treatment, we will call you to review the results.  Testing/Procedures: Please schedule calcium score ct scan.  The cost is $150.  Not covered by insurance.  Follow-Up: At [facility], you and your health needs are our priority.  As part of our continuing mission to provide you with exceptional heart care, we have created designated Provider Care Teams.  These Care Teams include your primary Cardiologist (physician) and Advanced Practice Providers (APPs -  Physician Assistants and Nurse Practitioners) who all work together to provide you with the care you need, when you need it. You will need a follow up appointment in: (Oct) 8 months.  Please call our office 2 months in advance to schedule this appointment.  You may see [name], MD or one of the following Advanced Practice Providers on your designated Care Team: Richardson Dopp, PA-C [place], [place] . Daune Perch, NP  Any Other Special Instructions Will Be Listed Below (If Applicable).

## 2018-12-13 LAB — CBC
Hematocrit: 37 % (ref 34.0–46.6)
Hemoglobin: 12.1 g/dL (ref 11.1–15.9)
MCH: 30.8 pg (ref 26.6–33.0)
MCHC: 32.7 g/dL (ref 31.5–35.7)
MCV: 94 fL (ref 79–97)
PLATELETS: 204 10*3/uL (ref 150–450)
RBC: 3.93 x10E6/uL (ref 3.77–5.28)
RDW: 12.9 % (ref 11.7–15.4)
WBC: 6.4 10*3/uL (ref 3.4–10.8)

## 2018-12-13 NOTE — Progress Notes (Signed)
Subjective:   Patient ID: Amanda Wilkins, female   DOB: 73 y.o.   MRN: [id number]   HPI Patient states she had a short period of relief after we had done the injection but she is still having tremendous discomfort in her right heel   ROS      Objective:  Physical Exam  Strong possibility that there is a stress fracture of the right heel that we try to work around but is still creating a lot of problems for her with pain noted within the bulk of the calcaneus itself along with the plantar heel     Assessment:  Probability for stress fracture right calcaneus     Plan:  At this point I do think complete immobilization is necessary and I went ahead and applied a air fracture walker with instructions on usage.  Patient will be seen back 3 weeks and we will re-x-ray and decide if anything else is appropriate

## 2018-12-15 ENCOUNTER — Ambulatory Visit: Payer: Medicare Other | Admitting: Podiatry

## 2018-12-26 ENCOUNTER — Ambulatory Visit (INDEPENDENT_AMBULATORY_CARE_PROVIDER_SITE_OTHER): Payer: Medicare Other | Admitting: Podiatry

## 2018-12-26 ENCOUNTER — Ambulatory Visit: Payer: Medicare Other | Admitting: Podiatry

## 2018-12-26 ENCOUNTER — Other Ambulatory Visit: Payer: Self-pay

## 2018-12-26 ENCOUNTER — Other Ambulatory Visit: Payer: Self-pay | Admitting: Internal Medicine

## 2018-12-26 ENCOUNTER — Ambulatory Visit (INDEPENDENT_AMBULATORY_CARE_PROVIDER_SITE_OTHER): Payer: Medicare Other

## 2018-12-26 ENCOUNTER — Encounter: Payer: Self-pay | Admitting: Podiatry

## 2018-12-26 DIAGNOSIS — M84374D Stress fracture, right foot, subsequent encounter for fracture with routine healing: Secondary | ICD-10-CM

## 2018-12-29 NOTE — Progress Notes (Signed)
Subjective:   Patient ID: Amanda Wilkins, female   DOB: 73 y.o.   MRN: [id number]   HPI Patient states that seems the heel is slightly better but it still very sore and making it hard to stand on.  States that she is feeling a little bit of improvement but it is hard for her to wear her boot   ROS      Objective:  Physical Exam  Neurovascular status intact with patient's right heel still sore but gradual improvement is occurring     Assessment:  Strong probability for stress fracture of the right calcaneus     Plan:  H&P condition reviewed and I recommended continued boot usage ice therapy and we may have to consider an injection if symptoms were to persist over the next 4 to 8 weeks but we are in to reevaluate again  X-ray indicates that the patient does have suspicion for calcaneal stress fracture

## 2019-01-05 ENCOUNTER — Inpatient Hospital Stay: Admission: RE | Admit: 2019-01-05 | Payer: Self-pay | Source: Ambulatory Visit

## 2019-01-13 ENCOUNTER — Other Ambulatory Visit: Payer: Self-pay | Admitting: Internal Medicine

## 2019-01-16 ENCOUNTER — Ambulatory Visit: Payer: Medicare Other | Admitting: Podiatry

## 2019-03-27 DIAGNOSIS — I10 Essential (primary) hypertension: Secondary | ICD-10-CM | POA: Diagnosis not present

## 2019-03-27 DIAGNOSIS — Z9989 Dependence on other enabling machines and devices: Secondary | ICD-10-CM | POA: Diagnosis not present

## 2019-03-27 DIAGNOSIS — G4733 Obstructive sleep apnea (adult) (pediatric): Secondary | ICD-10-CM | POA: Diagnosis not present

## 2019-03-27 DIAGNOSIS — Z6836 Body mass index (BMI) 36.0-36.9, adult: Secondary | ICD-10-CM | POA: Diagnosis not present

## 2019-03-27 DIAGNOSIS — E669 Obesity, unspecified: Secondary | ICD-10-CM | POA: Diagnosis not present

## 2019-04-14 ENCOUNTER — Other Ambulatory Visit: Payer: Medicare Other

## 2019-05-05 ENCOUNTER — Ambulatory Visit (INDEPENDENT_AMBULATORY_CARE_PROVIDER_SITE_OTHER): Payer: Medicare Other | Admitting: *Deleted

## 2019-05-05 DIAGNOSIS — Z0000 Encounter for general adult medical examination without abnormal findings: Secondary | ICD-10-CM

## 2019-05-05 NOTE — Progress Notes (Addendum)
Subjective:   Amanda Wilkins is a 73 y.o. female who presents for an Initial Medicare Annual Wellness Visit. I connected with patient by a telephone and verified that I am speaking with the correct person using two identifiers. Patient stated full name and DOB. Patient gave permission to continue with telephonic visit. Patient's location was at home and Nurse's location was at [place] office.   Review of Systems    Cardiac Risk Factors include: advanced age (>20men, >76 women) Sleep patterns: feels rested on waking, gets up 2-3 times nightly to void and sleeps 7-8 hours nightly. Wears C-PAP routinely.  Home Safety/Smoke Alarms: Feels safe in home. Smoke alarms in place.  Living environment; residence and Firearm Safety: [place], can live on one level. Seat Belt Safety/Bike Helmet: Wears seat belt.      Objective:    There were no vitals filed for this visit. There is no height or weight on file to calculate BMI.  Advanced Directives 05/05/2019 09/02/2018 09/01/2018 08/26/2018 05/31/2018 02/07/2018 12/30/2017  Does Patient Have a Medical Advance Directive? Yes Yes Yes Yes Yes Yes Yes  Type of Paramedic of [place];Living will Healthcare Power of [place];Living will [place];Living will Living will -  Does patient want to make changes to medical advance directive? - No - Patient declined No - Patient declined - - - No - Patient declined  Copy of [place] in Chart? No - copy requested No - copy requested No - copy requested No - copy requested No - copy requested No - copy requested (No Data)  Would patient like information on creating a medical advance directive? - - - - - - -    Current Medications (verified) Outpatient Encounter Medications as of 05/05/2019  Medication Sig  . acetaminophen (TYLENOL) 500 MG tablet Take 1,000 mg by mouth every 8 (eight)  hours as needed (pain).  [name] atenolol (TENORMIN) 50 MG tablet TAKE 1 TABLET BY MOUTH DAILY  . cholecalciferol (VITAMIN D3) 25 MCG (1000 UT) tablet Take 1,000 Units by mouth daily.  . clindamycin (CLEOCIN) 300 MG capsule TK 2 CS PO 30 MINUTES PRIOR TO DENTAL WORK  . hydroxychloroquine (PLAQUENIL) 200 MG tablet Take 200 mg by mouth every morning.   . Multiple Vitamin (MULTIVITAMIN) capsule Take 1 capsule by mouth daily.  . rivaroxaban (XARELTO) 20 MG TABS tablet Take 1 tablet (20 mg total) by mouth daily with supper.  . rosuvastatin (CRESTOR) 5 MG tablet TAKE 1 TABLET BY MOUTH DAILY  . sertraline (ZOLOFT) 50 MG tablet TAKE 1 AND 1/2 TABLET BY MOUTH DAILY  . [DISCONTINUED] desonide (DESOWEN) 0.05 % ointment    No facility-administered encounter medications on file as of 05/05/2019.     Allergies (verified) Epinephrine base, Aspirin, Penicillins, Synvisc [hylan g-f 20], and Tape   History: Past Medical History:  Diagnosis Date  . Anticoagulant long-term use    Xarelto  . Chronic interstitial nephritis   . Depression   . History of interstitial nephritis 2016   chronic  . History of nuclear stress test 12/16/2014   Intermediate risk nuclear study w/ medium size moderate severity reversible defect in the basal and mid inferolateral and inferior wall (per dr cardiology note , dr [name] did not think this was consistent with ischemia)/  normal LV function and wall motion, ef 75%  . History of septic shock 01/22/2015   in setting Group A Strep Cellulits  erysipelas/chest wall induration with streptoccocus basterium-- Severe sepsis, DIC, Acute respiratory failure with pulmonary edema, Acute Kidney failure with chronic interstitial nephritis  . Hypertension   . Hyponatremia   . Migraines    on Zoloft for migraines  . Mild carotid artery disease ([place])    per duplex 08-30-2017 bilateral ICA 1-39%  . OA (osteoarthritis) rheumotologist-  dr [name]   both knees,  shoulders, ankles  . OSA  on CPAP     moderate obstructive sleep apnea with an AHI of 23.4/h and oxygen desaturations as low as 84%.  Now on CPAP at 12 cm H2O.  [name] PAF (paroxysmal atrial fibrillation) [facility]) 2009   cardiologist-- dr [name]  . Paroxysmal atrial flutter ([place])    a. dx 11/2017.  [name] PSVT (paroxysmal supraventricular tachycardia) ([place])   . Wears contact lenses    Past Surgical History:  Procedure Laterality Date  . CESAREAN SECTION  1980  . ESOPHAGOGASTRODUODENOSCOPY N/A 02/08/2015   Procedure: ESOPHAGOGASTRODUODENOSCOPY (EGD);  Surgeon: [name], MD;  Location: [facility];  Service: Endoscopy;  Laterality: N/A;  . KNEE ARTHROSCOPY W/ MENISCAL REPAIR Left 08/2014    @[facility]   . SHOULDER SURGERY Right 04/04/2016  . TOTAL KNEE ARTHROPLASTY Right 09/01/2018   Procedure: RIGHT TOTAL KNEE ARTHROPLASTY;  Surgeon: [name], MD;  Location: [facility];  Service: Orthopedics;  Laterality: Right;   Family History  Problem Relation Age of Onset  . Pancreatic cancer Brother   . Lymphoma Mother   . Prostate cancer Father        metastatic prostate cancer  . Breast cancer Sister 58   Social History   Socioeconomic History  . Marital status: Married    Spouse name: Not on file  . Number of children: 2  . Years of education: Not on file  . Highest education level: Not on file  Occupational History  . Occupation: retired    Fish farm manager: UNEMPLOYED  . 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 Financial resource strain: Not hard at all  . Food insecurity    Worry: Never true    Inability: Never true  . Transportation needs    Medical: No    Non-medical: No  Tobacco Use  . Smoking status: Never Smoker  . Smokeless tobacco: Never Used  . Tobacco comment: H/O social smoking at times when drinking--never a "habit" (in the 1970s)  Substance and Sexual Activity  . Alcohol use: Yes    Alcohol/week: 0.0 standard drinks    Comment: Occasional  . Drug use: Never  . Sexual activity: Yes     Partners: Male    Birth control/protection: Post-menopausal    Comment: intercourse age 30, sexual partners more than 5  Lifestyle  . Physical activity    Days per week: 4 days    Minutes per session: 40 min  . Stress: Not at all  Relationships  . Social connections    Talks on phone: More than three times a week    Gets together: More than three times a week    Attends religious service: More than 4 times per year    Active member of club or organization: Yes    Attends meetings of clubs or organizations: More than 4 times per year    Relationship status: Married  Other Topics Concern  . Not on file  Social History Narrative  . Not on file    Tobacco Counseling Counseling given: Not Answered Comment: H/O social smoking at  times when drinking--never a "habit" (in the 1970s)  Activities of Daily Living In your present state of health, do you have any difficulty performing the following activities: 05/05/2019 09/02/2018  Hearing? N -  Vision? N -  Comment - -  Difficulty concentrating or making decisions? N -  Walking or climbing stairs? N -  Dressing or bathing? N -  Doing errands, shopping? N N  Preparing Food and eating ? N -  Using the Toilet? N -  In the past six months, have you accidently leaked urine? N -  Do you have problems with loss of bowel control? N -  Managing your Medications? N -  Managing your Finances? N -  Housekeeping or managing your Housekeeping? N -  Some recent data might be hidden     Immunizations and Health Maintenance Immunization History  Administered Date(s) Administered  . Influenza Split 09/24/2011, 09/24/2012  . Influenza Whole 08/09/2010  . Influenza, High Dose Seasonal PF 08/16/2016, 07/26/2017, 07/16/2018  . Influenza,inj,Quad PF,6+ Mos 07/14/2013, 07/27/2014, 06/14/2015  . Pneumococcal Conjugate-13 05/25/2014  . Pneumococcal Polysaccharide-23 06/21/2015  . Tdap 01/06/2013  . Zoster 01/24/2011   There are no preventive  care reminders to display for this patient.  Patient Care Team: Plotnikov, Evie Lacks, MD as PCP - General Fay Records, MD as PCP - Cardiology (Cardiology) Terrance Mass, MD (Inactive) as Consulting Physician (Gynecology) Leanora Cover, MD as Consulting Physician (Orthopedic Surgery) Cameron Sprang, MD as Consulting Physician (Neurology) [name], MD as Consulting Physician (Orthopedic Surgery) Regal, Tamala Fothergill, DPM as Consulting Physician (Podiatry) Royetta Crochet (Psychology)  Indicate any recent Medical Services you may have received from other than [facility] providers in the past year (date may be approximate).     Assessment:   This is a routine wellness examination for [facility]. Physical assessment deferred to PCP.  Hearing/Vision screen  Hearing Screening   125Hz  250Hz  500Hz  1000Hz  2000Hz  3000Hz  4000Hz  6000Hz  8000Hz   Right ear:           Left ear:           Comments: Able to hear conversational tones w/o difficulty. No issues reported.    Vision Screening Comments: appointment yearly   Dietary issues and exercise activities discussed: Current Exercise Habits: Home exercise routine, Type of exercise: calisthenics;stretching;treadmill(swimming), Time (Minutes): 45, Frequency (Times/Week): 5, Weekly Exercise (Minutes/Week): 225, Exercise limited by: orthopedic condition(s)  Diet (meal preparation, eat out, water intake, caffeinated beverages, dairy products, fruits and vegetables): in general, a "healthy" diet  , well balanced. Participates with the optifast diet program. eats a variety of fruits and vegetables daily, limits salt, fat/cholesterol, sugar,carbohydrates,caffeine, drinks 6-8 glasses of water daily.  Goals    . Stay healthy to keep my  heart strong     Continue to lose weight, eat healthy and exercise.      Depression Screen PHQ 2/9 Scores 05/05/2019 12/31/2016 12/31/2016 08/30/2015 07/12/2014 07/05/2014  PHQ - 2 Score 0 1 1 0 0 0    Fall Risk Fall Risk   05/05/2019 06/18/2018 12/31/2016 07/09/2016 11/08/2015  Falls in the past year? 0 Yes Yes No No  Number falls in past yr: 0 1 1 - -  Injury with Fall? - Yes No - -  Risk for fall due to : - - Impaired balance/gait - -  Follow up - - Falls prevention discussed - -   Cognitive Function:       Ad8 score reviewed for issues:  Issues making  decisions: no  Less interest in hobbies / activities: no  Repeats questions, stories (family complaining): no  Trouble using ordinary gadgets (microwave, computer, phone):no  Forgets the month or year: no  Mismanaging finances: no  Remembering appts: no  Daily problems with thinking and/or memory: no Ad8 score is= 0  Screening Tests Health Maintenance  Topic Date Due  . INFLUENZA VACCINE  05/16/2019  . MAMMOGRAM  06/06/2020  . COLONOSCOPY  01/28/2021  . TETANUS/TDAP  01/07/2023  . DEXA SCAN  Completed  . Hepatitis C Screening  Completed  . PNA vac Low Risk Adult  Completed     Plan:    Reviewed health maintenance screenings with patient today and relevant education, vaccines, and/or referrals were provided.   Continue to eat heart healthy diet (full of fruits, vegetables, whole grains, lean protein, water--limit salt, fat, and sugar intake) and increase physical activity as tolerated.  Continue doing brain stimulating activities (puzzles, reading, adult coloring books, staying active) to keep memory sharp.   I have personally reviewed and noted the following in the patient's chart:   . Medical and social history . Use of alcohol, tobacco or illicit drugs  . Current medications and supplements . Functional ability and status . Nutritional status . Physical activity . Advanced directives . List of other physicians . Screenings to include cognitive, depression, and falls . Referrals and appointments  In addition, I have reviewed and discussed with patient certain preventive protocols, quality metrics, and best practice  recommendations. A written personalized care plan for preventive services as well as general preventive health recommendations were provided to patient.     Michiel Cowboy, RN   05/05/2019    Medical screening examination/treatment/procedure(s) were performed by non-physician practitioner and as supervising physician I was immediately available for consultation/collaboration. I agree with above. Lew Dawes, MD

## 2019-05-05 NOTE — Addendum Note (Signed)
Addended by: Emelia Loron A on: 05/05/2019 11:57 AM   Modules accepted: Miquel Dunn

## 2019-05-13 ENCOUNTER — Other Ambulatory Visit: Payer: Self-pay

## 2019-05-13 ENCOUNTER — Ambulatory Visit: Payer: Self-pay

## 2019-05-13 DIAGNOSIS — Z79899 Other long term (current) drug therapy: Secondary | ICD-10-CM | POA: Diagnosis not present

## 2019-05-13 DIAGNOSIS — H2513 Age-related nuclear cataract, bilateral: Secondary | ICD-10-CM | POA: Diagnosis not present

## 2019-05-13 NOTE — Telephone Encounter (Signed)
Incoming call from Patient who reports a sore throat.  Off and on for three day 2days.  Reports a a runny nose, and very  fatigue.  Pt. Transferred to [place] office to scheduled virtual appointment.            [place] Female, 73 y.o., 03/19/1946 MRN:  [id number] Phone:  [phone] Jerilynn Mages) PCP:  [name], MD Primary Cvg:  Medicare/Medicare Part A And B Next Appt With Internal Medicine 05/05/2020 at 11:00 AM Message from [place] sent at 05/13/2019 3:25 PM EDT  Summary: concerns about covid    Pt has sore throat off an on for 2 days. She has cough and runny nose and fatigue. Denies fever. No positive exposure that pt knows of. She would like to know if she should get a corona test         Call History   Type Contact Phone User  05/13/2019 03:23 PM EDT Phone (Incoming) [place], [place] (Self) [phone] Lemmie Evens) Nils Flack, [name]    Reason for Disposition . [1] COVID-19 infection suspected by caller or triager AND [2] mild symptoms (cough, fever, or others) AND [0] no complications or SOB  Answer Assessment - Initial Assessment Questions 1. COVID-19 DIAGNOSIS: "Who made your Coronavirus (COVID-19) diagnosis?" "Was it confirmed by a positive lab test?" If not diagnosed by a HCP, ask "Are there lots of cases (community spread) where you live?" (See public health department website, if unsure)     *No Answer* 2. ONSET: "When did the COVID-19 symptoms start?"      yesterday 3. WORST SYMPTOM: "What is your worst symptom?" (e.g., cough, fever, shortness of breath, muscle aches)     tiredness 4. COUGH: "Do you have a cough?" If so, ask: "How bad is the cough?"       Yes ,dry 5. FEVER: "Do you have a fever?" If so, ask: "What is your temperature, how was it measured, and when did it start?"     denis 6. RESPIRATORY STATUS: "Describe your breathing?" (e.g., shortness of breath, wheezing, unable to speak)      denies 7. BETTER-SAME-WORSE: "Are  you getting better, staying the same or getting worse compared to yesterday?"  If getting worse, ask, "In what way?"     [place] more tired 8. HIGH RISK DISEASE: "Do you have any chronic medical problems?" (e.g., asthma, heart or lung disease, weak immune system, etc.)     denies 9. PREGNANCY: "Is there any chance you are pregnant?" "When was your last menstrual period?"     na 10. OTHER SYMPTOMS: "Do you have any other symptoms?"  (e.g., chills, fatigue, headache, loss of smell or taste, muscle pain, sore throat)       Fatigue, sore throat yesterday  Protocols used: CORONAVIRUS (COVID-19) DIAGNOSED OR SUSPECTED-A-AH

## 2019-05-14 ENCOUNTER — Ambulatory Visit (INDEPENDENT_AMBULATORY_CARE_PROVIDER_SITE_OTHER): Payer: Medicare Other | Admitting: Internal Medicine

## 2019-05-14 ENCOUNTER — Other Ambulatory Visit: Payer: Self-pay

## 2019-05-14 ENCOUNTER — Encounter: Payer: Self-pay | Admitting: Internal Medicine

## 2019-05-14 VITALS — BP 127/73 | HR 74 | Temp 98.0°F

## 2019-05-14 DIAGNOSIS — Z20828 Contact with and (suspected) exposure to other viral communicable diseases: Secondary | ICD-10-CM

## 2019-05-14 DIAGNOSIS — Z20822 Contact with and (suspected) exposure to covid-19: Secondary | ICD-10-CM

## 2019-05-14 DIAGNOSIS — R6889 Other general symptoms and signs: Secondary | ICD-10-CM | POA: Diagnosis not present

## 2019-05-14 DIAGNOSIS — J45909 Unspecified asthma, uncomplicated: Secondary | ICD-10-CM | POA: Diagnosis not present

## 2019-05-14 DIAGNOSIS — J399 Disease of upper respiratory tract, unspecified: Secondary | ICD-10-CM | POA: Diagnosis not present

## 2019-05-14 NOTE — Progress Notes (Signed)
Virtual Visit via Video Note  I connected with Amanda Wilkins on 05/14/19 at  8:30 AM EDT by a video enabled telemedicine application and verified that I am speaking with the correct person using two identifiers.   I discussed the limitations of evaluation and management by telemedicine and the availability of in person appointments. The patient expressed understanding and agreed to proceed.  History of Present Illness: C/o URI sx's x 3 d  There has been c/o ST, mild runny nose, cough, chest pain. No shortness of breath, abdominal pain, diarrhea, constipation, arthralgias, skin rashes.   Observations/Objective: The patient appears to be in no acute distress, looks well.  Assessment and Plan:  See my Assessment and Plan. Follow Up Instructions:    I discussed the assessment and treatment plan with the patient. The patient was provided an opportunity to ask questions and all were answered. The patient agreed with the plan and demonstrated an understanding of the instructions.   The patient was advised to call back or seek an in-person evaluation if the symptoms worsen or if the condition fails to improve as anticipated.  I provided face-to-face time during this encounter. We were at different locations.   [name], MD

## 2019-05-14 NOTE — Assessment & Plan Note (Signed)
Meds discussed Monitor tempr COVID 19 test + test [place]

## 2019-05-14 NOTE — Assessment & Plan Note (Signed)
No wheezing 

## 2019-05-16 LAB — NOVEL CORONAVIRUS, NAA: SARS-CoV-2, NAA: NOT DETECTED

## 2019-05-19 ENCOUNTER — Telehealth: Payer: Self-pay | Admitting: *Deleted

## 2019-05-19 NOTE — Telephone Encounter (Signed)
Pt called checking to see if she can come in the office, please advise

## 2019-05-19 NOTE — Telephone Encounter (Signed)
Ok OV Thx

## 2019-05-19 NOTE — Telephone Encounter (Signed)
OV scheduled 05/20/19.

## 2019-05-19 NOTE — Telephone Encounter (Signed)
Copied from [place] [phone]. Topic: Appointment Scheduling - Scheduling Inquiry for Clinic >> May 18, 2019  2:13 PM Scherrie Gerlach wrote: Reason for CRM: pt states she still has sore throat over a week, covid test negative.  Pt wants to come IN OFFICE to dee the dr if that is ok.

## 2019-05-19 NOTE — Telephone Encounter (Signed)
Patient called to say that she is still waiting on a call from Dr Alain Marion and need to hear something soon please. Ph# [phone]

## 2019-05-20 ENCOUNTER — Encounter: Payer: Self-pay | Admitting: Internal Medicine

## 2019-05-20 ENCOUNTER — Ambulatory Visit (INDEPENDENT_AMBULATORY_CARE_PROVIDER_SITE_OTHER): Payer: Medicare Other | Admitting: Internal Medicine

## 2019-05-20 DIAGNOSIS — J399 Disease of upper respiratory tract, unspecified: Secondary | ICD-10-CM | POA: Diagnosis not present

## 2019-05-20 MED ORDER — CEFUROXIME AXETIL 250 MG PO TABS: 250.0000 mg | ORAL_TABLET | Freq: Two times a day (BID) | ORAL | 0 refills | Status: DC

## 2019-05-20 MED ORDER — BENZONATATE 200 MG PO CAPS: 200.0000 mg | ORAL_CAPSULE | Freq: Three times a day (TID) | ORAL | 1 refills | Status: DC | PRN

## 2019-05-20 NOTE — Patient Instructions (Signed)
Call if worse

## 2019-05-20 NOTE — Assessment & Plan Note (Signed)
URI - not better Tessalon Rx, Delsym, Mucinex Ceftin x10 d Probiotic To ER if worse

## 2019-05-20 NOTE — Progress Notes (Signed)
Virtual Visit via Video Note  I connected with Amanda Wilkins on 05/20/19 at  8:50 AM EDT by a video enabled telemedicine application and verified that I am speaking with the correct person using two identifiers.   I discussed the limitations of evaluation and management by telemedicine and the availability of in person appointments. The patient expressed understanding and agreed to proceed.  History of Present Illness: We need to follow-up on URI x 8 days - worse C/o ST, CP, dry cough  There has been no runny nose,shortness of breath, abdominal pain, diarrhea, constipation, arthralgias, skin rashes.   Observations/Objective: The patient appears to be in no acute distress, looks OK, non-toxic. Coughing.  Assessment and Plan:  See my Assessment and Plan. Follow Up Instructions:    I discussed the assessment and treatment plan with the patient. The patient was provided an opportunity to ask questions and all were answered. The patient agreed with the plan and demonstrated an understanding of the instructions.   The patient was advised to call back or seek an in-person evaluation if the symptoms worsen or if the condition fails to improve as anticipated.  I provided face-to-face time during this encounter. We were at different locations.   [name], MD

## 2019-05-27 ENCOUNTER — Telehealth: Payer: Self-pay | Admitting: Internal Medicine

## 2019-05-27 DIAGNOSIS — Z79899 Other long term (current) drug therapy: Secondary | ICD-10-CM

## 2019-05-27 DIAGNOSIS — R05 Cough: Secondary | ICD-10-CM

## 2019-05-27 DIAGNOSIS — R059 Cough, unspecified: Secondary | ICD-10-CM

## 2019-05-27 DIAGNOSIS — R079 Chest pain, unspecified: Secondary | ICD-10-CM

## 2019-05-27 NOTE — Telephone Encounter (Signed)
CP sounds like from lungs Would st up for chest Xray (PA, Lateral)    Labs :   CBC , BMET If worse, I agree with A Plotnikov that she go to ED

## 2019-05-27 NOTE — Telephone Encounter (Signed)
Pt c/o Shortness Of Breath: STAT if SOB developed within the last 24 hours or pt is noticeably SOB on the phone  1. Are you currently SOB (can you hear that pt is SOB on the phone)? no  2. How long have you been experiencing SOB?  Last 4 days  3. Are you SOB when sitting or when up moving around? Walking, moving around and climbing steps  4. Are you currently experiencing any other symptoms?  When she takes a deep breath, it makes her coughSoreness behind her breast bone, which is strange and not normal- pt would like to see Dr [name] or PA

## 2019-05-27 NOTE — Telephone Encounter (Signed)
Pt called to report that she has been having problems with SOB with exertion and pain with inspiration.. she has a cough when she takes a deep breath.. she had televisit with Dr. Alain Marion 05/20/19 for URI and is being treated with antibiotics and other cold related meds.  Pt says she has not had much improvement. Her pain in her chest with taking a deep breath seems to be behind her breast bone on the left side. She denies sputum production with her cough, no fever, she does have a "burning" sensation in her throat. She denies nausea and vomiting. The burning does not seem to be related to.. before or after meals. Pt is not on any GI meds.   Advised pt that she needs to call Dr. Alain Marion office back to let him know that she is not feeling any better. I will need to follow up with Dr. [name] covering MD since she is out of the office to determine if appropriate to make her an OV at this time as per her request for the SOB.[name] advised her it may be related to her URI.. she sounds congested on the phone... She had a negative COVID test 05/14/19.

## 2019-05-28 ENCOUNTER — Telehealth: Payer: Self-pay | Admitting: *Deleted

## 2019-05-28 ENCOUNTER — Other Ambulatory Visit: Payer: Medicare Other | Admitting: *Deleted

## 2019-05-28 ENCOUNTER — Other Ambulatory Visit: Payer: Self-pay | Admitting: Internal Medicine

## 2019-05-28 ENCOUNTER — Ambulatory Visit
Admission: RE | Admit: 2019-05-28 | Discharge: 2019-05-28 | Disposition: A | Discharge status: Home / Self Care | Payer: Medicare Other | Source: Ambulatory Visit | Attending: Internal Medicine | Admitting: Internal Medicine

## 2019-05-28 ENCOUNTER — Other Ambulatory Visit: Payer: Self-pay

## 2019-05-28 ENCOUNTER — Other Ambulatory Visit: Payer: Self-pay | Admitting: *Deleted

## 2019-05-28 DIAGNOSIS — R079 Chest pain, unspecified: Secondary | ICD-10-CM

## 2019-05-28 DIAGNOSIS — R0789 Other chest pain: Secondary | ICD-10-CM | POA: Diagnosis not present

## 2019-05-28 DIAGNOSIS — R059 Cough, unspecified: Secondary | ICD-10-CM

## 2019-05-28 DIAGNOSIS — R05 Cough: Secondary | ICD-10-CM

## 2019-05-28 DIAGNOSIS — Z79899 Other long term (current) drug therapy: Secondary | ICD-10-CM

## 2019-05-28 LAB — CBC WITH DIFFERENTIAL/PLATELET
Basophils Absolute: 0.1 10*3/uL (ref 0.0–0.2)
Basos: 1 %
EOS (ABSOLUTE): 0.2 10*3/uL (ref 0.0–0.4)
Eos: 3 %
Hematocrit: 36.1 % (ref 34.0–46.6)
Hemoglobin: 12.5 g/dL (ref 11.1–15.9)
Immature Grans (Abs): 0 10*3/uL (ref 0.0–0.1)
Immature Granulocytes: 0 %
Lymphocytes Absolute: 2 10*3/uL (ref 0.7–3.1)
Lymphs: 37 %
MCH: 32.6 pg (ref 26.6–33.0)
MCHC: 34.6 g/dL (ref 31.5–35.7)
MCV: 94 fL (ref 79–97)
Monocytes Absolute: 0.5 10*3/uL (ref 0.1–0.9)
Monocytes: 10 %
Neutrophils Absolute: 2.6 10*3/uL (ref 1.4–7.0)
Neutrophils: 49 %
Platelets: 195 10*3/uL (ref 150–450)
RBC: 3.83 x10E6/uL (ref 3.77–5.28)
RDW: 11.2 % — ABNORMAL LOW (ref 11.7–15.4)
WBC: 5.4 10*3/uL (ref 3.4–10.8)

## 2019-05-28 LAB — BASIC METABOLIC PANEL
BUN/Creatinine Ratio: 18 (ref 12–28)
BUN: 14 mg/dL (ref 8–27)
CO2: 21 mmol/L (ref 20–29)
Calcium: 9.3 mg/dL (ref 8.7–10.3)
Chloride: 93 mmol/L — ABNORMAL LOW (ref 96–106)
Creatinine, Ser: 0.76 mg/dL (ref 0.57–1.00)
GFR calc Af Amer: 91 mL/min/{1.73_m2} (ref >59)
GFR calc non Af Amer: 79 mL/min/{1.73_m2} (ref >59)
Glucose: 98 mg/dL (ref 65–99)
Potassium: 4.9 mmol/L (ref 3.5–5.2)
Sodium: 130 mmol/L — ABNORMAL LOW (ref 134–144)

## 2019-05-28 NOTE — Telephone Encounter (Signed)
Spoke with the pt and she agrees to come in for [employer] and a CXR at [place] today.   [facility] [phone].[name] pt can walk in anytime today with her mask. Orders placed in Epic.

## 2019-05-28 NOTE — Telephone Encounter (Signed)
Pt saw MD on 8/5. MD is out of the office this week pls advise on  msg below.Amanda Wilkins  Copied from [place] [id number]. Topic: Appointment Scheduling - Scheduling Inquiry for Clinic >> May 27, 2019  2:38 PM Amanda Wilkins wrote: Reason for CRM: Pt called and is requesting Wilkins chest x ray because she is still having shortness of breath and Wilkins cough. Pt is requesting this be done very soon because she has been dealing with this for Wilkins while.  Please advise.

## 2019-05-28 NOTE — Telephone Encounter (Signed)
Per chart pt also called Dr. [name] office and she order both cxr & labs. Pt went today to have done @ [facility]. Will cancel MD order he placed for today.[name] KitchenJohny Chess

## 2019-06-02 ENCOUNTER — Other Ambulatory Visit: Payer: Self-pay | Admitting: Internal Medicine

## 2019-06-04 ENCOUNTER — Other Ambulatory Visit: Payer: Self-pay | Admitting: Obstetrics & Gynecology

## 2019-06-04 DIAGNOSIS — Z1231 Encounter for screening mammogram for malignant neoplasm of breast: Secondary | ICD-10-CM

## 2019-06-08 ENCOUNTER — Other Ambulatory Visit: Payer: Self-pay | Admitting: *Deleted

## 2019-06-08 ENCOUNTER — Telehealth: Payer: Self-pay | Admitting: *Deleted

## 2019-06-08 NOTE — Telephone Encounter (Signed)

## 2019-06-09 ENCOUNTER — Other Ambulatory Visit: Payer: Self-pay

## 2019-06-09 ENCOUNTER — Telehealth (INDEPENDENT_AMBULATORY_CARE_PROVIDER_SITE_OTHER): Payer: Medicare Other | Admitting: Cardiology

## 2019-06-09 ENCOUNTER — Encounter: Payer: Self-pay | Admitting: Cardiology

## 2019-06-09 DIAGNOSIS — I10 Essential (primary) hypertension: Secondary | ICD-10-CM

## 2019-06-09 DIAGNOSIS — Z9989 Dependence on other enabling machines and devices: Secondary | ICD-10-CM | POA: Diagnosis not present

## 2019-06-09 DIAGNOSIS — Z6837 Body mass index (BMI) 37.0-37.9, adult: Secondary | ICD-10-CM | POA: Diagnosis not present

## 2019-06-09 DIAGNOSIS — E669 Obesity, unspecified: Secondary | ICD-10-CM

## 2019-06-09 DIAGNOSIS — G4733 Obstructive sleep apnea (adult) (pediatric): Secondary | ICD-10-CM | POA: Diagnosis not present

## 2019-06-09 NOTE — Progress Notes (Signed)
Virtual Visit via Video Note   This visit type was conducted due to national recommendations for restrictions regarding the COVID-19 Pandemic (e.g. social distancing) in an effort to limit this patient's exposure and mitigate transmission in our community.  Due to her co-morbid illnesses, this patient is at least at moderate risk for complications without adequate follow up.  This format is felt to be most appropriate for this patient at this time.  All issues noted in this document were discussed and addressed.  A limited physical exam was performed with this format.  Please refer to the patient's chart for her consent to telehealth for [facility].  Evaluation Performed:  Follow-up visit  This visit type was conducted due to national recommendations for restrictions regarding the COVID-19 Pandemic (e.g. social distancing).  This format is felt to be most appropriate for this patient at this time.  All issues noted in this document were discussed and addressed.  No physical exam was performed (except for noted visual exam findings with Video Visits).  Please refer to the patient's chart (MyChart message for video visits and phone note for telephone visits) for the patient's consent to telehealth for [facility].  Date:  06/09/2019   ID:  Amanda Wilkins, DOB 1946-01-06, MRN [id number]  Patient Location:  Home  Provider location:   [place]  PCP:  Plotnikov, Evie Lacks, MD  Cardiologist:  [name], MD  Sleep medicine:  Fransico Him, MD Electrophysiologist:  None   Chief Complaint:  OSA  History of Present Illness:    Amanda Wilkins is a 73 y.o. female who presents via audio/video conferencing for a telehealth visit today.    Amanda Wilkins is a 73 y.o. female with a hx of paroxysmal atrial fibrillation and flutter, hypertension and sleep apnea. Due to ongoing hypertension, snoring atrial fibrillation sleep study was ordered showing moderate obstructive sleep apnea  with an AHI of 23.4/h and oxygen desaturations as low as 84%.  She subsequently underwent CPAP titration to 12 cm H2O.  She is doing well with her CPAP device and thinks that she has gotten used to it.  She tolerates the mask and feels the pressure is adequate.  Since going on CPAP she feels rested in the am and has no significant daytime sleepiness.  She denies any significant mouth or nasal dryness or nasal congestion.  She does not think that he snores.    The patient does not have symptoms concerning for COVID-19 infection (fever, chills, cough, or new shortness of breath).   Prior CV studies:   The following studies were reviewed today:  PAP compliance download  Past Medical History:  Diagnosis Date  . Anticoagulant long-term use    Xarelto  . Chronic interstitial nephritis   . Depression   . History of interstitial nephritis 2016   chronic  . History of nuclear stress test 12/16/2014   Intermediate risk nuclear study w/ medium size moderate severity reversible defect in the basal and mid inferolateral and inferior wall (per dr cardiology note , dr [name] did not think this was consistent with ischemia)/  normal LV function and wall motion, ef 75%  . History of septic shock 01/22/2015   in setting Group A Strep Cellulits erysipelas/chest wall induration with streptoccocus basterium-- Severe sepsis, DIC, Acute respiratory failure with pulmonary edema, Acute Kidney failure with chronic interstitial nephritis  . Hypertension   . Hyponatremia   . Migraines    on Zoloft for migraines  . Mild carotid artery  disease ([place])    per duplex 08-30-2017 bilateral ICA 1-39%  . OA (osteoarthritis) rheumotologist-  dr [name]   both knees,  shoulders, ankles  . OSA on CPAP     moderate obstructive sleep apnea with an AHI of 23.4/h and oxygen desaturations as low as 84%.  Now on CPAP at 12 cm H2O.  [name] PAF (paroxysmal atrial fibrillation) [facility]) 2009   cardiologist-- dr [name]  .  Paroxysmal atrial flutter ([place])    a. dx 11/2017.  [name] PSVT (paroxysmal supraventricular tachycardia) ([place])   . Wears contact lenses    Past Surgical History:  Procedure Laterality Date  . CESAREAN SECTION  1980  . ESOPHAGOGASTRODUODENOSCOPY N/A 02/08/2015   Procedure: ESOPHAGOGASTRODUODENOSCOPY (EGD);  Surgeon: [name], MD;  Location: [place];  Service: Endoscopy;  Laterality: N/A;  . KNEE ARTHROSCOPY W/ MENISCAL REPAIR Left 08/2014    @[facility]   . SHOULDER SURGERY Right 04/04/2016  . TOTAL KNEE ARTHROPLASTY Right 09/01/2018   Procedure: RIGHT TOTAL KNEE ARTHROPLASTY;  Surgeon: [name], MD;  Location: [facility];  Service: Orthopedics;  Laterality: Right;     Current Meds  Medication Sig  . acetaminophen (TYLENOL) 500 MG tablet Take 1,000 mg by mouth every 8 (eight) hours as needed (pain).  [name] atenolol (TENORMIN) 50 MG tablet TAKE 1 TABLET BY MOUTH DAILY  . cholecalciferol (VITAMIN D3) 25 MCG (1000 UT) tablet Take 1,000 Units by mouth daily.  . clindamycin (CLEOCIN) 300 MG capsule TK 2 CS PO 30 MINUTES PRIOR TO DENTAL WORK  . hydroxychloroquine (PLAQUENIL) 200 MG tablet Take 200 mg by mouth every morning.   . Multiple Vitamin (MULTIVITAMIN) capsule Take 1 capsule by mouth daily.  . rivaroxaban (XARELTO) 20 MG TABS tablet Take 1 tablet (20 mg total) by mouth daily with supper.  . rosuvastatin (CRESTOR) 5 MG tablet TAKE 1 TABLET BY MOUTH EVERY DAY  . sertraline (ZOLOFT) 50 MG tablet TAKE 1 AND 1/2 TABLET BY MOUTH DAILY     Allergies:   Epinephrine base, Aspirin, Penicillins, Synvisc [hylan g-f 20], and Tape   Social History   Tobacco Use  . Smoking status: Never Smoker  . Smokeless tobacco: Never Used  . Tobacco comment: H/O social smoking at times when drinking--never a "habit" (in the 1970s)  Substance Use Topics  . Alcohol use: Yes    Alcohol/week: 0.0 standard drinks    Comment: Occasional  . Drug use: Never     Family Hx: The patient's family history includes  Breast cancer (age of onset: 55) in her sister; Lymphoma in her mother; Pancreatic cancer in her brother; Prostate cancer in her father.  ROS:   Please see the history of present illness.     All other systems reviewed and are negative.   Labs/Other Tests and Data Reviewed:    Recent Labs: 08/26/2018: ALT 22 05/28/2019: BUN 14; Creatinine, Ser 0.76; Hemoglobin 12.5; Platelets 195; Potassium 4.9; Sodium 130   Recent Lipid Panel Lab Results  Component Value Date/Time   CHOL 167 05/28/2018 08:37 AM   TRIG 69.0 05/28/2018 08:37 AM   HDL 98.90 05/28/2018 08:37 AM   CHOLHDL 2 05/28/2018 08:37 AM   LDLCALC 54 05/28/2018 08:37 AM    Wt Readings from Last 3 Encounters:  06/09/19 223 lb (101.2 kg)  12/12/18 223 lb 9.6 oz (101.4 kg)  09/01/18 227 lb (103 kg)     Objective:    Vital Signs:  BP (!) 145/66   Pulse 77   Ht  5\' 5"  (1.651 m)   Wt 223 lb (101.2 kg)   BMI 37.11 kg/m    CONSTITUTIONAL:  Well nourished, well developed female in no acute distress.  EYES: anicteric MOUTH: oral mucosa is pink RESPIRATORY: Normal respiratory effort, symmetric expansion CARDIOVASCULAR: No peripheral edema SKIN: No rash, lesions or ulcers MUSCULOSKELETAL: no digital cyanosis NEURO: Cranial Nerves II-XII grossly intact, moves all extremities PSYCH: Intact judgement and insight.  A&O x 3, Mood/affect appropriate   ASSESSMENT & PLAN:    1.  OSA - The patient is tolerating PAP therapy well without any problems. The PAP download was reviewed today and showed an AHI of 2.4/hr on 12 cm H2O with 73% compliance in using more than 4 hours nightly.  The patient has been using and benefiting from PAP use and will continue to benefit from therapy.   2.  Hypertension - Her BP is borderline controlled and she will continue on Atenolol 50mg  daily.  3.  Obesity - I have encouraged her to get into a routine exercise program and cut back on carbs and portions.   COVID-19 Education: The signs and symptoms  of COVID-19 were discussed with the patient and how to seek care for testing (follow up with PCP or arrange E-visit).  The importance of social distancing was discussed today.  Patient Risk:   After full review of this patient's clinical status, I feel that they are at least moderate risk at this time.  Time:   Today, I have spent 15 minutes directly with the patient on telemedicine discussing medical problems including OSA, HTN.  We also reviewed the symptoms of COVID 19 and the ways to protect against contracting the virus with telehealth technology.  I spent an additional 5 minutes reviewing patient's chart including PAP compliance download.  Medication Adjustments/Labs and Tests Ordered: Current medicines are reviewed at length with the patient today.  Concerns regarding medicines are outlined above.  Tests Ordered: No orders of the defined types were placed in this encounter.  Medication Changes: No orders of the defined types were placed in this encounter.   Disposition:  Follow up in 1 year(s)  Signed, Fransico Him, MD  06/09/2019 10:37 AM    [facility]

## 2019-06-09 NOTE — Patient Instructions (Signed)
Medication Instructions:  The current medical regimen is effective;  continue present plan and medications.  If you need a refill on your cardiac medications before your next appointment, please call your pharmacy.   Follow-Up: Follow up in 1 year with Dr. Radford Pax.  You will receive a letter in the mail 2 months before you are due.  Please call us when you receive this letter to schedule your follow up appointment.  Thank you for choosing [facility]!!

## 2019-06-11 DIAGNOSIS — Z79899 Other long term (current) drug therapy: Secondary | ICD-10-CM | POA: Diagnosis not present

## 2019-07-02 ENCOUNTER — Ambulatory Visit (INDEPENDENT_AMBULATORY_CARE_PROVIDER_SITE_OTHER)
Admission: RE | Admit: 2019-07-02 | Discharge: 2019-07-02 | Disposition: A | Discharge status: Home / Self Care | Payer: Self-pay | Source: Ambulatory Visit | Attending: Internal Medicine | Admitting: Internal Medicine

## 2019-07-02 ENCOUNTER — Other Ambulatory Visit: Payer: Self-pay

## 2019-07-02 DIAGNOSIS — I480 Paroxysmal atrial fibrillation: Secondary | ICD-10-CM

## 2019-07-03 ENCOUNTER — Encounter: Payer: Self-pay | Admitting: Internal Medicine

## 2019-07-03 ENCOUNTER — Ambulatory Visit (INDEPENDENT_AMBULATORY_CARE_PROVIDER_SITE_OTHER): Payer: Medicare Other | Admitting: Internal Medicine

## 2019-07-03 VITALS — BP 126/60 | HR 73 | Ht 65.0 in | Wt 232.4 lb

## 2019-07-03 DIAGNOSIS — R0602 Shortness of breath: Secondary | ICD-10-CM

## 2019-07-03 DIAGNOSIS — E782 Mixed hyperlipidemia: Secondary | ICD-10-CM | POA: Diagnosis not present

## 2019-07-03 MED ORDER — PANTOPRAZOLE SODIUM 40 MG PO TBEC: 40.0000 mg | DELAYED_RELEASE_TABLET | Freq: Every day | ORAL | 2 refills | Status: DC

## 2019-07-03 NOTE — Patient Instructions (Addendum)
Medication Instructions:  Your physician recommends that you continue on your current medications as directed. Please refer to the Current Medication list given to you today.  If you need a refill on your cardiac medications before your next appointment, please call your pharmacy.   Lab work: Your physician recommends that you return for lab work in:  1.) today: sed rate, ana, lipids   If you have labs (blood work) drawn today and your tests are completely normal, you will receive your results only by: [name] MyChart Message (if you have MyChart) OR . A paper copy in the mail If you have any lab test that is abnormal or we need to change your treatment, we will call you to review the results.  Testing/Procedures: Your physician has recommended that you have a pulmonary function test. Pulmonary Function Tests are a group of tests that measure how well air moves in and out of your lungs.   Follow-Up: At [facility], you and your health needs are our priority.  As part of our continuing mission to provide you with exceptional heart care, we have created designated Provider Care Teams.  These Care Teams include your primary Cardiologist (physician) and Advanced Practice Providers (APPs -  Physician Assistants and Nurse Practitioners) who all work together to provide you with the care you need, when you need it. You will need a follow up appointment in:  9 months.  Please call our office 2 months in advance to schedule this appointment.  You may see [name], MD or one of the following Advanced Practice Providers on your designated Care Team: Richardson Dopp, PA-C [place], [place] . Daune Perch, NP  An Pulmonary Function Tests Pulmonary function tests (PFTs) are used to measure how well your lungs work, find out what is causing your lung problems, and figure out the best treatment for you. You may have PFTs:  When you have an illness involving the lungs.  To follow changes in your lung  function over time if you have a chronic lung disease.  If you are an Nature conservation officer. This checks the effects of being exposed to chemicals over a long period of time.  To check lung function before having surgery or other procedures.  To check your lungs if you smoke.  To check if prescribed medicines or treatments are helping your lungs. Your results will be compared to the expected lung function of someone with healthy lungs who is similar to you in:  Age.  Gender.  Height.  Weight.  Race or ethnicity. This is done to show how your lungs compare to normal lung function (percent predicted). This is how your health care provider knows if your lung function is normal or not. If you have had PFTs done before, your health care provider will compare your current results with past results. This shows if your lung function is better, worse, or the same as before. Tell a health care provider about:  Any allergies you have.  All medicines you are taking, including inhaler or nebulizer medicines, vitamins, herbs, eye drops, creams, and over-the-counter medicines.  Any blood disorders you have.  Any surgeries you have had, especially recent eye surgery, abdominal surgery, or chest surgery. These can make PFTs difficult or unsafe.  Any medical conditions you have, including chest pain or heart problems, tuberculosis, or respiratory infections such as pneumonia, a cold, or the flu.  Any fear of being in closed spaces (claustrophobia). Some of your tests may be in  a closed space. What are the risks? Generally, this is a safe procedure. However, problems may occur, including:  Light-headedness due to over-breathing (hyperventilation).  An asthma attack from deep breathing.  A collapsed lung. What happens before the procedure?  Take over-the-counter and prescription medicines only as told by your health care provider. If you take inhaler or nebulizer medicines, ask your health  care provider which medicines you should take on the day of your testing. Some inhaler medicines may interfere with PFTs if they are taken shortly before the tests.  Follow your health care provider's instructions on eating and drinking restrictions. This may include avoiding eating large meals and drinking alcohol before the testing.  Do not use any products that contain nicotine or tobacco, such as cigarettes and e-cigarettes. If you need help quitting, ask your health care provider.  Wear comfortable clothing that will not interfere with breathing. What happens during the procedure?   You will be given a soft nose clip to wear. This is done so all of your breaths will go through your mouth instead of your nose.  You will be given a germ-free (sterile) mouthpiece. It will be attached to a machine that measures your breathing (spirometer).  You will be asked to do various breathing maneuvers. The maneuvers will be done by breathing in (inhaling) and breathing out (exhaling). You may be asked to repeat the maneuvers several times before the testing is done.  It is important to follow the instructions exactly to get accurate results. Make sure to blow as hard and as fast as you can when you are told to do so.  You may be given a medicine that makes the small air passages in your lungs larger (bronchodilator) after testing has been done. This medicine will make it easier for you to breathe.  The tests will be repeated after the bronchodilator has taken effect.  You will be monitored carefully during the procedure for faintness, dizziness, trouble breathing, or any other problems. The procedure may vary among health care providers and hospitals. What happens after the procedure?  It is up to you to get your test results. Ask your health care provider, or the department that is doing the tests, when your results will be ready. After you have received your test results, talk with your health  care provider about treatment options, if necessary. Summary  Pulmonary function tests (PFTs) are used to measure how well your lungs work, find out what is causing your lung problems, and figure out the best treatment for you.  Wear comfortable clothing that will not interfere with breathing.  It is up to you to get your test results. After you have received them, talk with your health care provider about treatment options, if necessary. This information is not intended to replace advice given to you by your health care provider. Make sure you discuss any questions you have with your health care provider. Document Released: 05/24/2004 Document Revised: 09/28/2016 Document Reviewed: 08/23/2016 Elsevier Patient Education  2020 [employer]. y Other Special Instructions Will Be Listed Below (If Applicable).

## 2019-07-03 NOTE — Progress Notes (Signed)
Cardiology Office Note   Date:  07/03/2019   ID:  Amanda Wilkins, DOB July 18, 1946, MRN [id number]  PCP:  [name], MD  Cardiologist:   [name], MD    F/u of PAF   History of Present Illness: Amanda Wilkins is a 73 y.o. female with a history of CP and PAF  ON anticoagulation  Echo in 2016 showed normal LVEF   Myovue showed inferolateral and inferior defect consistent with ischemia With symptoms I recomm cardiac CT which was declined by insurance    The patient was followed  [facility] has had near syncope in past    Carotid USN showed mild plaquing   Holter showed intermitt afib/SVT   NOt always sensded   Flecanide recommended but pt did not take    In Feb 201o she had atrial flutter   REverted to SR on own  After that she had intermitt heart pounding   REcomm that she take an extra metoprolol  I saw the pt in Feb 2020   In July she was seen by A Plotnikov   Had nonproductive cough   Worse with walknig   She says when she is on stationary bike she does not have    Note she had a Calcium score CTyesterday    Current Meds  Medication Sig  . acetaminophen (TYLENOL) 500 MG tablet Take 1,000 mg by mouth every 8 (eight) hours as needed (pain).  [name] atenolol (TENORMIN) 50 MG tablet TAKE 1 TABLET BY MOUTH DAILY  . cholecalciferol (VITAMIN D3) 25 MCG (1000 UT) tablet Take 1,000 Units by mouth daily.  . clindamycin (CLEOCIN) 300 MG capsule TK 2 CS PO 30 MINUTES PRIOR TO DENTAL WORK  . hydroxychloroquine (PLAQUENIL) 200 MG tablet Take 200 mg by mouth every morning.   . Multiple Vitamin (MULTIVITAMIN) capsule Take 1 capsule by mouth daily.  . rivaroxaban (XARELTO) 20 MG TABS tablet Take 1 tablet (20 mg total) by mouth daily with supper.  . rosuvastatin (CRESTOR) 5 MG tablet TAKE 1 TABLET BY MOUTH EVERY DAY  . sertraline (ZOLOFT) 50 MG tablet TAKE 1 AND 1/2 TABLET BY MOUTH DAILY     Allergies:   Epinephrine base, Aspirin, Penicillins, Synvisc [hylan g-f 20], and Tape   Past  Medical History:  Diagnosis Date  . Anticoagulant long-term use    Xarelto  . Chronic interstitial nephritis   . Depression   . History of interstitial nephritis 2016   chronic  . History of nuclear stress test 12/16/2014   Intermediate risk nuclear study w/ medium size moderate severity reversible defect in the basal and mid inferolateral and inferior wall (per dr cardiology note , dr [name] did not think this was consistent with ischemia)/  normal LV function and wall motion, ef 75%  . History of septic shock 01/22/2015   in setting Group A Strep Cellulits erysipelas/chest wall induration with streptoccocus basterium-- Severe sepsis, DIC, Acute respiratory failure with pulmonary edema, Acute Kidney failure with chronic interstitial nephritis  . Hypertension   . Hyponatremia   . Migraines    on Zoloft for migraines  . Mild carotid artery disease ([place])    per duplex 08-30-2017 bilateral ICA 1-39%  . OA (osteoarthritis) rheumotologist-  dr [name]   both knees,  shoulders, ankles  . OSA on CPAP     moderate obstructive sleep apnea with an AHI of 23.4/h and oxygen desaturations as low as 84%.  Now on CPAP at 12 cm H2O.  [name]  PAF (paroxysmal atrial fibrillation) [facility]) 2009   cardiologist-- dr [name]  . Paroxysmal atrial flutter ([place])    a. dx 11/2017.  [name] PSVT (paroxysmal supraventricular tachycardia) ([place])   . Wears contact lenses     Past Surgical History:  Procedure Laterality Date  . CESAREAN SECTION  1980  . ESOPHAGOGASTRODUODENOSCOPY N/A 02/08/2015   Procedure: ESOPHAGOGASTRODUODENOSCOPY (EGD);  Surgeon: [name], MD;  Location: [place];  Service: Endoscopy;  Laterality: N/A;  . KNEE ARTHROSCOPY W/ MENISCAL REPAIR Left 08/2014    @[facility]   . SHOULDER SURGERY Right 04/04/2016  . TOTAL KNEE ARTHROPLASTY Right 09/01/2018   Procedure: RIGHT TOTAL KNEE ARTHROPLASTY;  Surgeon: [name], MD;  [facility];  Service: Orthopedics;  Laterality: Right;      Social History:  The patient  reports that she has never smoked. She has never used smokeless tobacco. She reports current alcohol use. She reports that she does not use drugs.   Family History:  The patient's family history includes Breast cancer (age of onset: 71) in her sister; Lymphoma in her mother; Pancreatic cancer in her brother; Prostate cancer in her father.    ROS:  Please see the history of present illness. All other systems are reviewed and  Negative to the above problem except as noted.    PHYSICAL EXAM: VS:  BP 126/60   Pulse 73   Ht 5\' 5"  (1.651 m)   Wt 232 lb 6.4 oz (105.4 kg)   SpO2 97%   BMI 38.67 kg/m   GEN: Obese 74 yo  in no acute distress  HEENT: normal  Neck: JVP is normal  No carotid bruits, or masses Cardiac: RRR; no murmurs, rubs, or gallops,no edema  Respiratory:  clear to auscultation bilaterally, normal work of breathing GI: soft, nontender, nondistended, + BS  No hepatomegaly  MS: no deformity Moving all extremities   Skin: warm and dry, no rash Neuro:  Strength and sensation are intact Psych: euthymic mood, full affect   EKG:  EKG is not  ordered today.   Lipid Panel    Component Value Date/Time   CHOL 167 05/28/2018 0837   TRIG 69.0 05/28/2018 0837   HDL 98.90 05/28/2018 0837   CHOLHDL 2 05/28/2018 0837   VLDL 13.8 05/28/2018 0837   LDLCALC 54 05/28/2018 0837      Wt Readings from Last 3 Encounters:  07/03/19 232 lb 6.4 oz (105.4 kg)  06/09/19 223 lb (101.2 kg)  12/12/18 223 lb 9.6 oz (101.4 kg)      ASSESSMENT AND PLAN:  1 PAF  Clinically in SR   Continue current regimen   2  Hx CP     CT yesterday showed  Ca score was 0   Mild atherosclerosis of aorta     2  HTN  BP is controlled   Keep on current meds        3  CV dz  Has USN sched in Dec of neck  4  HL  Keep on statin     F/U in 6 months        Current medicines are reviewed at length with the patient today.  The patient does not have concerns regarding  medicines.  Signed, [name], MD  07/03/2019 2:17 PM    [facility] [place], [place],   [address] Phone: [phone]; Fax: [phone]

## 2019-07-04 LAB — LIPID PANEL
Chol/HDL Ratio: 1.8 ratio (ref 0.0–4.4)
Cholesterol, Total: 167 mg/dL (ref 100–199)
HDL: 95 mg/dL (ref >39)
LDL Chol Calc (NIH): 58 mg/dL (ref 0–99)
Triglycerides: 72 mg/dL (ref 0–149)
VLDL Cholesterol Cal: 14 mg/dL (ref 5–40)

## 2019-07-04 LAB — SEDIMENTATION RATE: Sed Rate: 13 mm/hr (ref 0–40)

## 2019-07-04 LAB — ANA: Anti Nuclear Antibody (ANA): NEGATIVE

## 2019-07-09 ENCOUNTER — Ambulatory Visit
Admission: RE | Admit: 2019-07-09 | Discharge: 2019-07-09 | Disposition: A | Discharge status: Home / Self Care | Payer: Medicare Other | Source: Ambulatory Visit | Attending: Obstetrics & Gynecology | Admitting: Obstetrics & Gynecology

## 2019-07-09 ENCOUNTER — Other Ambulatory Visit: Payer: Self-pay

## 2019-07-09 DIAGNOSIS — Z1231 Encounter for screening mammogram for malignant neoplasm of breast: Secondary | ICD-10-CM | POA: Diagnosis not present

## 2019-07-10 DIAGNOSIS — R635 Abnormal weight gain: Secondary | ICD-10-CM | POA: Diagnosis not present

## 2019-07-10 DIAGNOSIS — Z6835 Body mass index (BMI) 35.0-35.9, adult: Secondary | ICD-10-CM | POA: Diagnosis not present

## 2019-07-10 DIAGNOSIS — I10 Essential (primary) hypertension: Secondary | ICD-10-CM | POA: Diagnosis not present

## 2019-07-16 ENCOUNTER — Other Ambulatory Visit: Payer: Self-pay | Admitting: *Deleted

## 2019-07-16 ENCOUNTER — Other Ambulatory Visit: Payer: Self-pay | Admitting: Internal Medicine

## 2019-07-22 ENCOUNTER — Encounter: Payer: Self-pay | Admitting: Gynecology

## 2019-07-27 ENCOUNTER — Other Ambulatory Visit (HOSPITAL_COMMUNITY)
Admission: RE | Admit: 2019-07-27 | Discharge: 2019-07-27 | Disposition: A | Discharge status: Home / Self Care | Payer: Medicare Other | Source: Ambulatory Visit | Attending: Internal Medicine | Admitting: Internal Medicine

## 2019-07-27 DIAGNOSIS — Z01812 Encounter for preprocedural laboratory examination: Secondary | ICD-10-CM | POA: Diagnosis not present

## 2019-07-27 DIAGNOSIS — Z20828 Contact with and (suspected) exposure to other viral communicable diseases: Secondary | ICD-10-CM | POA: Diagnosis not present

## 2019-07-28 LAB — NOVEL CORONAVIRUS, NAA (HOSP ORDER, SEND-OUT TO REF LAB; TAT 18-24 HRS): SARS-CoV-2, NAA: NOT DETECTED

## 2019-07-31 ENCOUNTER — Ambulatory Visit (INDEPENDENT_AMBULATORY_CARE_PROVIDER_SITE_OTHER): Payer: Medicare Other | Admitting: Internal Medicine

## 2019-07-31 DIAGNOSIS — R0602 Shortness of breath: Secondary | ICD-10-CM | POA: Diagnosis not present

## 2019-07-31 LAB — PULMONARY FUNCTION TEST
DL/VA % pred: 106 %
DL/VA: 4.34 ml/min/mmHg/L
DLCO unc % pred: 102 %
DLCO unc: 20.93 ml/min/mmHg
FEF 25-75 Post: 2.81 L/sec
FEF 25-75 Pre: 2.04 L/sec
FEF2575-%Change-Post: 38 %
FEF2575-%Pred-Post: 148 %
FEF2575-%Pred-Pre: 107 %
FEV1-%Change-Post: 8 %
FEV1-%Pred-Post: 104 %
FEV1-%Pred-Pre: 96 %
FEV1-Post: 2.46 L
FEV1-Pre: 2.28 L
FEV1FVC-%Change-Post: 9 %
FEV1FVC-%Pred-Pre: 103 %
FEV6-%Change-Post: 0 %
FEV6-%Pred-Post: 97 %
FEV6-%Pred-Pre: 97 %
FEV6-Post: 2.89 L
FEV6-Pre: 2.9 L
FEV6FVC-%Change-Post: 0 %
FEV6FVC-%Pred-Post: 104 %
FEV6FVC-%Pred-Pre: 104 %
FVC-%Change-Post: -1 %
FVC-%Pred-Post: 92 %
FVC-%Pred-Pre: 93 %
FVC-Post: 2.89 L
FVC-Pre: 2.92 L
Post FEV1/FVC ratio: 85 %
Post FEV6/FVC ratio: 100 %
Pre FEV1/FVC ratio: 78 %
Pre FEV6/FVC Ratio: 99 %
RV % pred: 99 %
RV: 2.29 L
TLC % pred: 101 %
TLC: 5.36 L

## 2019-07-31 NOTE — Progress Notes (Signed)
PFT done today. 

## 2019-08-03 ENCOUNTER — Ambulatory Visit (INDEPENDENT_AMBULATORY_CARE_PROVIDER_SITE_OTHER): Payer: Medicare Other

## 2019-08-03 ENCOUNTER — Other Ambulatory Visit: Payer: Self-pay

## 2019-08-03 DIAGNOSIS — Z23 Encounter for immunization: Secondary | ICD-10-CM

## 2019-08-04 ENCOUNTER — Telehealth: Payer: Self-pay | Admitting: Internal Medicine

## 2019-08-04 NOTE — Telephone Encounter (Signed)
New message   Patient would like a call to go over pulmonary function test. Please call.

## 2019-08-04 NOTE — Telephone Encounter (Signed)
Patient informed of review of PFt by Dr. [name].  She is happy to hear normal study.  Wonders why she is SOB but states she thinks for the last several days it has been better.

## 2019-08-10 ENCOUNTER — Ambulatory Visit: Payer: Medicare Other | Admitting: Internal Medicine

## 2019-08-31 ENCOUNTER — Telehealth: Payer: Self-pay | Admitting: *Deleted

## 2019-08-31 DIAGNOSIS — G4733 Obstructive sleep apnea (adult) (pediatric): Secondary | ICD-10-CM

## 2019-08-31 NOTE — Telephone Encounter (Addendum)
Patient called in to say her events were climbing high to 6.7 and after speaking to the RT's at [facility] she was advised to call Dr Theodosia Blender office and have [place] send over a prescription for a 2 week Auto Cpap Titration to see if her pressure needed to be adjusted. Looking in airview her AHI  Reads 3.2 so I don't know where she is seeing the 6.7 number. (07/31/19- 08/29/19)

## 2019-09-01 ENCOUNTER — Other Ambulatory Visit: Payer: Self-pay | Admitting: Internal Medicine

## 2019-09-04 NOTE — Addendum Note (Signed)
Addended by: Freada Bergeron on: 09/04/2019 09:53 AM   Modules accepted: Orders

## 2019-09-04 NOTE — Telephone Encounter (Signed)
RE: New Rx please advise Sueanne Margarita, MD  Freada Bergeron, CMA        2 week auto CPAP from 4 to 18cm H2O

## 2019-09-12 NOTE — Addendum Note (Signed)
Addended by: Freada Bergeron on: 09/12/2019 06:07 PM   Modules accepted: Orders

## 2019-09-24 ENCOUNTER — Other Ambulatory Visit: Payer: Self-pay

## 2019-09-24 MED ORDER — ROSUVASTATIN CALCIUM 5 MG PO TABS: 5.0000 mg | ORAL_TABLET | Freq: Every day | ORAL | 1 refills | Status: DC

## 2019-11-05 ENCOUNTER — Ambulatory Visit: Payer: Medicare Other | Attending: Internal Medicine

## 2019-11-05 DIAGNOSIS — Z23 Encounter for immunization: Secondary | ICD-10-CM | POA: Insufficient documentation

## 2019-11-05 NOTE — Progress Notes (Signed)
   Covid-19 Vaccination Clinic  Name:  Amanda Wilkins    MRN: [id number] DOB: 25-Jul-1946  11/05/2019  Amanda Wilkins was observed post Covid-19 immunization for 30 minutes based on pre-vaccination screening without incidence. She was provided with Vaccine Information Sheet and instruction to access the V-Safe system.   Amanda Wilkins was instructed to call 911 with any severe reactions post vaccine: [name] Difficulty breathing  . Swelling of your face and throat  . A fast heartbeat  . A bad rash all over your body  . Dizziness and weakness    Immunizations Administered    Name Date Dose VIS Date Route   Pfizer COVID-19 Vaccine 11/05/2019  9:34 AM 0.3 mL 09/25/2019 Intramuscular   Manufacturer: [place]   Lot: [id number]   [place]: [id number]

## 2019-11-13 DIAGNOSIS — R635 Abnormal weight gain: Secondary | ICD-10-CM | POA: Diagnosis not present

## 2019-11-13 DIAGNOSIS — E785 Hyperlipidemia, unspecified: Secondary | ICD-10-CM | POA: Diagnosis not present

## 2019-11-13 DIAGNOSIS — R7301 Impaired fasting glucose: Secondary | ICD-10-CM | POA: Diagnosis not present

## 2019-11-13 DIAGNOSIS — E876 Hypokalemia: Secondary | ICD-10-CM | POA: Diagnosis not present

## 2019-11-13 DIAGNOSIS — Z6837 Body mass index (BMI) 37.0-37.9, adult: Secondary | ICD-10-CM | POA: Diagnosis not present

## 2019-11-23 NOTE — Progress Notes (Signed)
Cardiology Office Note    Date:  11/24/2019   ID:  Amanda Wilkins, DOB Feb 04, 1946, MRN [id number]  PCP:  [name], MD  Cardiologist:  Dr. [name]  Chief Complaint: SOB  History of Present Illness:   Amanda Wilkins is a 74 y.o. female with a hx of paroxysmal atrial fibrillation and flutter, PSVT, hypertension and sleep apnea on CPAP seen for shortness of breath.   In 2016 she had an abnormal stress test. Dr. [name] recommended cardiac CT but this was declined by insurance. Later admitted with recurrent afib in setting of sepsis. Echo 01/2015 with normal LVEF. Around that time CT angio for chest negative for PE and non indication of coronary disease. Cardiac CT 06/2019 showed calcium score of 0. Mild plaque by carotid doppler bilaterally 08/2017.  She got 1st Vaccine shot 11/05/19.  Patient for evaluation of dyspnea on exertion, 6 months, recently worsened.  Usually her shortness of breath occurs with minimal exertional activities like walking to mailbox or going up and down off a stair.  She does not have any symptoms during strength training or riding a stationary bicycle.  She rides stationary bicycle at 12 mph for 3 miles, 2-3 times per week.  She does strength training 5 days a week.  She has no associated shortness of breath.  Denies dizziness, orthopnea, PND, syncope, lower extremity edema or melena.  Patient reports intermittent A. fib episode occurring once every few weeks and last for 10 minutes or so with self resolution.  Compliant with Xarelto.  No bleeding issue.   Past Medical History:  Diagnosis Date  . Anticoagulant long-term use    Xarelto  . Chronic interstitial nephritis   . Depression   . History of interstitial nephritis 2016   chronic  . History of nuclear stress test 12/16/2014   Intermediate risk nuclear study w/ medium size moderate severity reversible defect in the basal and mid inferolateral and inferior wall (per dr cardiology note , dr [name] did not think this was consistent with ischemia)/  normal LV function and wall motion, ef 75%  . History of septic shock 01/22/2015   in setting Group A Strep Cellulits erysipelas/chest wall induration with streptoccocus basterium-- Severe sepsis, DIC, Acute respiratory failure with pulmonary edema, Acute Kidney failure with chronic interstitial nephritis  . Hypertension   . Hyponatremia   . Migraines    on Zoloft for migraines  . Mild carotid artery disease ([place])    per duplex 08-30-2017 bilateral ICA 1-39%  . OA (osteoarthritis) rheumotologist-  dr [name]   both knees,  shoulders, ankles  . OSA on CPAP     moderate obstructive sleep apnea with an AHI of 23.4/h and oxygen desaturations as low as 84%.  Now on CPAP at 12 cm H2O.  [name] PAF (paroxysmal atrial fibrillation) [facility]) 2009   cardiologist-- dr [name]  . Paroxysmal atrial flutter ([place])    a. dx 11/2017.  [name] PSVT (paroxysmal supraventricular tachycardia) ([place])   . Wears contact lenses     Past Surgical History:  Procedure Laterality Date  . CESAREAN SECTION  1980  . ESOPHAGOGASTRODUODENOSCOPY N/A 02/08/2015   Procedure: ESOPHAGOGASTRODUODENOSCOPY (EGD);  Surgeon: [name], MD;  Location: [facility];  Service: Endoscopy;  Laterality: N/A;  . KNEE ARTHROSCOPY W/ MENISCAL REPAIR Left 08/2014    @[facility]   . SHOULDER SURGERY Right 04/04/2016  . TOTAL KNEE ARTHROPLASTY Right 09/01/2018   Procedure: RIGHT TOTAL KNEE ARTHROPLASTY;  Surgeon: [name], MD;  Location: [facility];  Service: Orthopedics;  Laterality: Right;    Current Medications: Prior to Admission medications   Medication Sig Start Date End Date Taking? Authorizing Provider  acetaminophen (TYLENOL) 500 MG tablet Take 1,000 mg by mouth every 8 (eight) hours as needed (pain).    [provider]  atenolol (TENORMIN) 50 MG tablet TAKE 1 TABLET BY MOUTH DAILY 12/28/18   Plotnikov, Evie Lacks, MD  cholecalciferol (VITAMIN D3) 25 MCG (1000 UT) tablet  Take 1,000 Units by mouth daily.    [provider]  clindamycin (CLEOCIN) 300 MG capsule TK 2 CS PO 30 MINUTES PRIOR TO DENTAL WORK 12/04/18   [provider]  hydroxychloroquine (PLAQUENIL) 200 MG tablet Take 200 mg by mouth every morning.     [provider]  Multiple Vitamin (MULTIVITAMIN) capsule Take 1 capsule by mouth daily.    [provider]  pantoprazole (PROTONIX) 40 MG tablet Take 1 tablet (40 mg total) by mouth daily. 07/03/19   Fay Records, MD  rosuvastatin (CRESTOR) 5 MG tablet Take 1 tablet (5 mg total) by mouth daily. 09/24/19   Plotnikov, Evie Lacks, MD  sertraline (ZOLOFT) 50 MG tablet TAKE 1 AND 1/2 TABLET BY MOUTH DAILY 01/15/19   Plotnikov, Evie Lacks, MD  XARELTO 20 MG TABS tablet TAKE 1 TABLET BY MOUTH DAILY WITH SUPPER 09/01/19   Plotnikov, Evie Lacks, MD    Allergies:   Epinephrine base, Aspirin, Penicillins, Synvisc [hylan g-f 20], and Tape   Social History   Socioeconomic History  . Marital status: Married    Spouse name: Not on file  . Number of children: 2  . Years of education: Not on file  . Highest education level: Not on file  Occupational History  . Occupation: retired    Fish farm manager: UNEMPLOYED  . Occupation: Part-time (with temp agency)  Tobacco Use  . Smoking status: Never Smoker  . Smokeless tobacco: Never Used  . Tobacco comment: H/O social smoking at times when drinking--never a "habit" (in the 1970s)  Substance and Sexual Activity  . Alcohol use: Yes    Alcohol/week: 0.0 standard drinks    Comment: Occasional  . Drug use: Never  . Sexual activity: Yes    Partners: Male    Birth control/protection: Post-menopausal    Comment: intercourse age 67, sexual partners more than 5  Other Topics Concern  . Not on file  Social History Narrative  . Not on file   Social Determinants of Health   Financial Resource Strain: Low Risk   . Difficulty of Paying Living Expenses: Not hard at all  Food Insecurity: No Food  Insecurity  . Worried About Charity fundraiser in the Last Year: Never true  . Ran Out of Food in the Last Year: Never true  Transportation Needs: No Transportation Needs  . Lack of Transportation (Medical): No  . Lack of Transportation (Non-Medical): No  Physical Activity: Sufficiently Active  . Days of Exercise per Week: 4 days  . Minutes of Exercise per Session: 40 min  Stress: No Stress Concern Present  . Feeling of Stress : Not at all  Social Connections: Not Isolated  . Frequency of Communication with Friends and Family: More than three times a week  . Frequency of Social Gatherings with Friends and Family: More than three times a week  . Attends Religious Services: More than 4 times per year  . Active Member of Clubs or Organizations: Yes  . Attends Archivist Meetings:  More than 4 times per year  . Marital Status: Married     Family History:  The patient's family history includes Breast cancer (age of onset: 42) in her sister; Lymphoma in her mother; Pancreatic cancer in her brother; Prostate cancer in her father.   ROS:   Please see the history of present illness.    ROS All other systems reviewed and are negative.   PHYSICAL EXAM:   VS:  BP 126/80   Pulse 72   Ht 5\' 5"  (1.651 m)   Wt 237 lb (107.5 kg)   SpO2 94%   BMI 39.44 kg/m    GEN: Well nourished, well developed, in no acute distress  HEENT: normal  Neck: no JVD, carotid bruits, or masses Cardiac: RRR; no murmurs, rubs, or gallops, trace ankle edema Respiratory:  clear to auscultation bilaterally, normal work of breathing GI: soft, nontender, nondistended, + BS MS: no deformity or atrophy  Skin: warm and dry, no rash Neuro:  Alert and Oriented x 3, Strength and sensation are intact Psych: euthymic mood, full affect  Wt Readings from Last 3 Encounters:  11/24/19 237 lb (107.5 kg)  07/03/19 232 lb 6.4 oz (105.4 kg)  06/09/19 223 lb (101.2 kg)      Studies/Labs Reviewed:   EKG:  EKG is  ordered today.  The ekg ordered today demonstrates sinus rhythm at rate of 72 bpm  Recent Labs: 05/28/2019: BUN 14; Creatinine, Ser 0.76; Hemoglobin 12.5; Platelets 195; Potassium 4.9; Sodium 130   Lipid Panel    Component Value Date/Time   CHOL 167 07/03/2019 1458   TRIG 72 07/03/2019 1458   HDL 95 07/03/2019 1458   CHOLHDL 1.8 07/03/2019 1458   CHOLHDL 2 05/28/2018 0837   VLDL 13.8 05/28/2018 0837   LDLCALC 58 07/03/2019 1458    Additional studies/ records that were reviewed today include:   As summarized above   ASSESSMENT & PLAN:    1. Dyspnea on exertion -She has longstanding history of shortness of breath with activity which has worsened recently.  She has no dyspnea while doing strength training or riding bicycle (high intensity activity).  However  Has shortness of breath with low intensity activity like walking to mailbox.  Recent CT coronary with calcium score of 0.  Normal pulmonary function test.  She has intermittent A. fib but her symptoms out of proportion  for arrhythmia.  Questionable due to diastolic dysfunction.  She has chronic hyponatremia.  Check BMP and BNP.  Update echocardiogram.  2.  Hyponatremia -Check BMP  3.  Obstructive sleep apnea -Compliant with CPAP  4.  Paroxysmal atrial fibrillation -Reports intermittent A. fib episode.  If above work-up inconclusive, may consider monitor to rule out persistent arrhythmia for which she is asymptomatic.  Compliant with atenolol and  Xarelto.  No bleeding issue.    Medication Adjustments/Labs and Tests Ordered: Current medicines are reviewed at length with the patient today.  Concerns regarding medicines are outlined above.  Medication changes, Labs and Tests ordered today are listed in the Patient Instructions below. Patient Instructions  Your physician recommends that you continue on your current medications as directed. Please refer to the Current Medication list given to you today.  Your physician has  requested that you have an echocardiogram. Echocardiography is a painless test that uses sound waves to create images of your heart. It provides your doctor with information about the size and shape of your heart and how well your heart's chambers and valves are working.  This procedure takes approximately one hour. There are no restrictions for this procedure.  Your physician recommends that you return for lab work in:  [place] recommends that you schedule a follow-up appointment in: 4-6 [place], Leanor Kail, [place]  11/24/2019 2:54 PM    [facility] [place], [place], [place]  [address] Phone: [phone]; Fax: [phone]

## 2019-11-24 ENCOUNTER — Ambulatory Visit (INDEPENDENT_AMBULATORY_CARE_PROVIDER_SITE_OTHER): Payer: Medicare Other | Admitting: Physician Assistant

## 2019-11-24 ENCOUNTER — Encounter: Payer: Self-pay | Admitting: Physician Assistant

## 2019-11-24 ENCOUNTER — Other Ambulatory Visit: Payer: Self-pay

## 2019-11-24 VITALS — BP 126/80 | HR 72 | Ht 65.0 in | Wt 237.0 lb

## 2019-11-24 DIAGNOSIS — R0600 Dyspnea, unspecified: Secondary | ICD-10-CM | POA: Diagnosis not present

## 2019-11-24 DIAGNOSIS — I10 Essential (primary) hypertension: Secondary | ICD-10-CM

## 2019-11-24 DIAGNOSIS — R0602 Shortness of breath: Secondary | ICD-10-CM

## 2019-11-24 DIAGNOSIS — E871 Hypo-osmolality and hyponatremia: Secondary | ICD-10-CM

## 2019-11-24 DIAGNOSIS — G4733 Obstructive sleep apnea (adult) (pediatric): Secondary | ICD-10-CM

## 2019-11-24 DIAGNOSIS — E669 Obesity, unspecified: Secondary | ICD-10-CM | POA: Diagnosis not present

## 2019-11-24 DIAGNOSIS — I480 Paroxysmal atrial fibrillation: Secondary | ICD-10-CM | POA: Diagnosis not present

## 2019-11-24 DIAGNOSIS — Z9989 Dependence on other enabling machines and devices: Secondary | ICD-10-CM | POA: Diagnosis not present

## 2019-11-24 DIAGNOSIS — R0609 Other forms of dyspnea: Secondary | ICD-10-CM

## 2019-11-24 NOTE — Patient Instructions (Signed)
Your physician recommends that you continue on your current medications as directed. Please refer to the Current Medication list given to you today.  Your physician has requested that you have an echocardiogram. Echocardiography is a painless test that uses sound waves to create images of your heart. It provides your doctor with information about the size and shape of your heart and how well your heart's chambers and valves are working. This procedure takes approximately one hour. There are no restrictions for this procedure.  Your physician recommends that you return for lab work in:  [place] recommends that you schedule a follow-up appointment in: 4-6 [place]

## 2019-11-25 ENCOUNTER — Telehealth: Payer: Self-pay | Admitting: Internal Medicine

## 2019-11-25 LAB — BASIC METABOLIC PANEL
BUN/Creatinine Ratio: 28 (ref 12–28)
BUN: 21 mg/dL (ref 8–27)
CO2: 19 mmol/L — ABNORMAL LOW (ref 20–29)
Calcium: 10.2 mg/dL (ref 8.7–10.3)
Chloride: 99 mmol/L (ref 96–106)
Creatinine, Ser: 0.76 mg/dL (ref 0.57–1.00)
GFR calc Af Amer: 91 mL/min/{1.73_m2} (ref >59)
GFR calc non Af Amer: 79 mL/min/{1.73_m2} (ref >59)
Glucose: 86 mg/dL (ref 65–99)
Potassium: 4.6 mmol/L (ref 3.5–5.2)
Sodium: 136 mmol/L (ref 134–144)

## 2019-11-25 LAB — PRO B NATRIURETIC PEPTIDE: NT-Pro BNP: 433 pg/mL — ABNORMAL HIGH (ref 0–301)

## 2019-11-25 NOTE — Telephone Encounter (Signed)
Follow Up:     Pt said she saw her lab results on My-Chart. She said she need some explanation about it, some of it she did not understand.

## 2019-11-25 NOTE — Telephone Encounter (Signed)
-----   Message from [place], [place] sent at 11/25/2019  1:42 PM EST ----- Anderson [place] patient trying to understand lab, but can u check why BMET has not resulted yet?  BNP is minimally up, mild volume overload  ----- Message ----- From: Interface, [facility] Lab Results In Sent: 11/25/2019   8:37 AM EST To: Leanor Kail, [place]

## 2019-11-25 NOTE — Telephone Encounter (Signed)
Returned call to pt .. see result note.

## 2019-11-26 ENCOUNTER — Telehealth: Payer: Self-pay | Admitting: *Deleted

## 2019-11-26 ENCOUNTER — Ambulatory Visit: Payer: Medicare Other | Attending: Internal Medicine

## 2019-11-26 DIAGNOSIS — Z79899 Other long term (current) drug therapy: Secondary | ICD-10-CM

## 2019-11-26 DIAGNOSIS — Z23 Encounter for immunization: Secondary | ICD-10-CM | POA: Insufficient documentation

## 2019-11-26 MED ORDER — FUROSEMIDE 20 MG PO TABS: ORAL_TABLET | ORAL | 3 refills | Status: DC

## 2019-11-26 MED ORDER — POTASSIUM CHLORIDE ER 10 MEQ PO TBCR: EXTENDED_RELEASE_TABLET | ORAL | 3 refills | Status: DC

## 2019-11-26 NOTE — Telephone Encounter (Signed)
-----   Message from [place], [place] sent at 11/26/2019  7:41 AM EST ----- Renal function and electrolytes normal. Mild fluid marker elevation. Trial of lasix 20mg  MWF and Kdur 75meq MWF. BMET in 2 weeks. Follow up with Dr. [name] as schedule. Forward copy of labs to PCP.

## 2019-11-26 NOTE — Progress Notes (Signed)
   Covid-19 Vaccination Clinic  Name:  Amanda Wilkins    MRN: [id number] DOB: 09-09-1946  11/26/2019  [name] was observed post Covid-19 immunization for 15 minutes without incidence. She was provided with Vaccine Information Sheet and instruction to access the V-Safe system.   [name] was instructed to call 911 with any severe reactions post vaccine: [name] Difficulty breathing  . Swelling of your face and throat  . A fast heartbeat  . A bad rash all over your body  . Dizziness and weakness    Immunizations Administered    Name Date Dose VIS Date Route   Pfizer COVID-19 Vaccine 11/26/2019  9:52 AM 0.3 mL 09/25/2019 Intramuscular   Manufacturer: [place]   Lot: [id number]   [place]: [id number]

## 2019-12-08 ENCOUNTER — Other Ambulatory Visit: Payer: Self-pay | Admitting: Internal Medicine

## 2019-12-08 ENCOUNTER — Telehealth: Payer: Self-pay

## 2019-12-08 NOTE — Telephone Encounter (Signed)
New message    Please advise on virtual appt or office visit   Virtual appt offer with Jodi Mourning on 2/24 patient decline.  The patient is asking for a message sent around to Dr. Alain Marion for guidance.   C/o Wave of Nausea more than a week patient does not think it's COVID.   COVID vaccine complete.

## 2019-12-08 NOTE — Telephone Encounter (Signed)
Virtual appt made for thursday

## 2019-12-09 ENCOUNTER — Ambulatory Visit (HOSPITAL_COMMUNITY): Payer: Medicare Other | Attending: Cardiology

## 2019-12-09 ENCOUNTER — Other Ambulatory Visit: Payer: Self-pay

## 2019-12-09 DIAGNOSIS — R0602 Shortness of breath: Secondary | ICD-10-CM | POA: Insufficient documentation

## 2019-12-09 DIAGNOSIS — G4730 Sleep apnea, unspecified: Secondary | ICD-10-CM | POA: Insufficient documentation

## 2019-12-09 DIAGNOSIS — I4891 Unspecified atrial fibrillation: Secondary | ICD-10-CM | POA: Diagnosis not present

## 2019-12-09 DIAGNOSIS — R002 Palpitations: Secondary | ICD-10-CM | POA: Diagnosis not present

## 2019-12-09 DIAGNOSIS — Z87891 Personal history of nicotine dependence: Secondary | ICD-10-CM | POA: Diagnosis not present

## 2019-12-09 DIAGNOSIS — R0600 Dyspnea, unspecified: Secondary | ICD-10-CM

## 2019-12-09 DIAGNOSIS — R0609 Other forms of dyspnea: Secondary | ICD-10-CM

## 2019-12-09 DIAGNOSIS — I10 Essential (primary) hypertension: Secondary | ICD-10-CM | POA: Diagnosis not present

## 2019-12-09 DIAGNOSIS — D649 Anemia, unspecified: Secondary | ICD-10-CM | POA: Insufficient documentation

## 2019-12-10 ENCOUNTER — Ambulatory Visit (INDEPENDENT_AMBULATORY_CARE_PROVIDER_SITE_OTHER): Payer: Medicare Other | Admitting: Internal Medicine

## 2019-12-10 ENCOUNTER — Encounter: Payer: Self-pay | Admitting: Internal Medicine

## 2019-12-10 DIAGNOSIS — R0609 Other forms of dyspnea: Secondary | ICD-10-CM

## 2019-12-10 DIAGNOSIS — R0600 Dyspnea, unspecified: Secondary | ICD-10-CM | POA: Diagnosis not present

## 2019-12-10 DIAGNOSIS — R110 Nausea: Secondary | ICD-10-CM | POA: Diagnosis not present

## 2019-12-10 MED ORDER — PANTOPRAZOLE SODIUM 40 MG PO TBEC: 40.0000 mg | DELAYED_RELEASE_TABLET | Freq: Every day | ORAL | 2 refills | Status: DC

## 2019-12-10 NOTE — Progress Notes (Signed)
Virtual Visit via Video Note  I connected with Amanda Wilkins on 12/10/19 at 10:20 AM EST by a video enabled telemedicine application and verified that I am speaking with the correct person using two identifiers.  The patient and the provider were at separate locations throughout the entire encounter.   I discussed the limitations of evaluation and management by telemedicine and the availability of in person appointments. The patient expressed understanding and agreed to proceed. The patient and the provider were the only parties present for the visit unless noted in HPI below.  History of Present Illness: The patient is a 74 y.o. female with visit for nausea. Started in the last few weeks she is not sure of exact timeline of onset. Started taking lasix 3 times a week after visit with cardiology and elevated BNP. Had echo yesterday which was fairly normal with mildly elevated PA pressure and mild mitral valve without signs of heart failure. She was started on this to see if it would help her SOB. She does not feel it has helped that. She is also not sure if the nausea started at the same time or not. Denies fevers or chills. Denies bloating. Denies vomiting. Denies diarrhea or constipation. Denies blood in stool. Overall it is not improving. Has tried nothing  Observations/Objective: Appearance: normal, breathing appears normal, casual grooming, abdomen does not appear distended no tenderness to self palpation, throat normal, memory normal, mental status is A and O times 3  Assessment and Plan: See problem oriented charting  Follow Up Instructions: restart protonix, she may need to stop lasix since this is not helping if protonix not effective  Visit time 20 minutes in face to face communication with patient and coordination of care, additional 10 minutes spent in record review, coordination or care, ordering tests, communicating/referring to other healthcare professionals, documenting in  medical records all on the same day of the visit for total time 30 minutes spent on the visit.    I discussed the assessment and treatment plan with the patient. The patient was provided an opportunity to ask questions and all were answered. The patient agreed with the plan and demonstrated an understanding of the instructions.   The patient was advised to call back or seek an in-person evaluation if the symptoms worsen or if the condition fails to improve as anticipated.  Hoyt Koch, MD

## 2019-12-10 NOTE — Assessment & Plan Note (Signed)
Since lasix has not helped and echo fairly normal I would recommend she ask cardiology if she should stop. It appears she may have nausea as a side effect of this medication.

## 2019-12-10 NOTE — Assessment & Plan Note (Signed)
May be related to recent start of lasix or GERD. RX protonix to try daily for 1-2 weeks. If no improvement would recommend to stop lasix.

## 2019-12-11 ENCOUNTER — Other Ambulatory Visit: Payer: Self-pay

## 2019-12-11 ENCOUNTER — Other Ambulatory Visit: Payer: Medicare Other | Admitting: *Deleted

## 2019-12-11 DIAGNOSIS — Z79899 Other long term (current) drug therapy: Secondary | ICD-10-CM | POA: Diagnosis not present

## 2019-12-11 LAB — BASIC METABOLIC PANEL
BUN/Creatinine Ratio: 13 (ref 12–28)
BUN: 12 mg/dL (ref 8–27)
CO2: 22 mmol/L (ref 20–29)
Calcium: 9.5 mg/dL (ref 8.7–10.3)
Chloride: 94 mmol/L — ABNORMAL LOW (ref 96–106)
Creatinine, Ser: 0.9 mg/dL (ref 0.57–1.00)
GFR calc Af Amer: 73 mL/min/{1.73_m2} (ref >59)
GFR calc non Af Amer: 64 mL/min/{1.73_m2} (ref >59)
Glucose: 104 mg/dL — ABNORMAL HIGH (ref 65–99)
Potassium: 4.3 mmol/L (ref 3.5–5.2)
Sodium: 133 mmol/L — ABNORMAL LOW (ref 134–144)

## 2019-12-21 DIAGNOSIS — M1812 Unilateral primary osteoarthritis of first carpometacarpal joint, left hand: Secondary | ICD-10-CM | POA: Diagnosis not present

## 2019-12-21 DIAGNOSIS — M65332 Trigger finger, left middle finger: Secondary | ICD-10-CM | POA: Diagnosis not present

## 2019-12-21 DIAGNOSIS — M79642 Pain in left hand: Secondary | ICD-10-CM | POA: Diagnosis not present

## 2019-12-24 NOTE — Progress Notes (Signed)
Cardiology Office Note   Date:  12/25/2019   ID:  Amanda Wilkins, DOB 12-May-1946, MRN [id number]  PCP:  [name], MD  Cardiologist:   [name], MD    F/u of PAF   History of Present Illness: Amanda Wilkins is a 74 y.o. female with a history of chest pain, near syncope  and PAF  She is on anticoagulation  Echo in 2016 showed normal LVEF   Myovue showed inferolateral and inferior defect consistent with ischemia   SHe has had near syncope in past    Carotid USN showed mild plaquing   Holter showed intermitt afib/SVT   NOt always sended   Flecanide recommended but pt did not start    In Feb 201o she had atrial flutter   REverted to SR on own  After that she had intermitt heart pounding   Recommendation was to take an extra metoprolol as needed    I saw the pt in Sept 2020  Calcium score was 0  PFTs nromal     She was seen by B Bhagat in Feb 2021  She continued to note DOE esp with hills  Not with riding bike  BMET and BNP done  BNP was a little elevated at 433  Lasix ws recommended 3x per wk    A repeat echo ordered   This showed normal LVEF and RVEF   There was mild MR noted   RVSP estimated at 47 mm Hg    Since seen she still says with hills, inclines her breathng is short   May be overall breathing better on flat surfaces   Current Meds  Medication Sig  . acetaminophen (TYLENOL) 500 MG tablet Take 1,000 mg by mouth every 8 (eight) hours as needed (pain).  [name] atenolol (TENORMIN) 50 MG tablet TAKE 1 TABLET BY MOUTH DAILY  . cholecalciferol (VITAMIN D3) 25 MCG (1000 UT) tablet Take 1,000 Units by mouth daily.  . clindamycin (CLEOCIN) 300 MG capsule TK 2 CS PO 30 MINUTES PRIOR TO DENTAL WORK  . furosemide (LASIX) 20 MG tablet Take 1 tablet by mouth on Mondays Wednesdays Fridays  . hydroxychloroquine (PLAQUENIL) 200 MG tablet Take 200 mg by mouth every morning.   . Multiple Vitamin (MULTIVITAMIN) capsule Take 1 capsule by mouth daily.  . pantoprazole (PROTONIX) 40  MG tablet Take 1 tablet (40 mg total) by mouth daily.  . potassium chloride (KLOR-CON) 10 MEQ tablet Take 1 tablet by mouth on Mondays Wednesdays Fridays  . rosuvastatin (CRESTOR) 5 MG tablet Take 1 tablet (5 mg total) by mouth daily.  . sertraline (ZOLOFT) 50 MG tablet TAKE 1 AND 1/2 TABLET BY MOUTH DAILY  . XARELTO 20 MG TABS tablet TAKE 1 TABLET BY MOUTH DAILY WITH SUPPER     Allergies:   Epinephrine base, Aspirin, Penicillins, Synvisc [hylan g-f 20], and Tape   Past Medical History:  Diagnosis Date  . Anticoagulant long-term use    Xarelto  . Chronic interstitial nephritis   . Depression   . History of interstitial nephritis 2016   chronic  . History of nuclear stress test 12/16/2014   Intermediate risk nuclear study w/ medium size moderate severity reversible defect in the basal and mid inferolateral and inferior wall (per dr cardiology note , dr [name] did not think this was consistent with ischemia)/  normal LV function and wall motion, ef 75%  . History of septic shock 01/22/2015   in setting Group A Strep Cellulits erysipelas/chest wall  induration with streptoccocus basterium-- Severe sepsis, DIC, Acute respiratory failure with pulmonary edema, Acute Kidney failure with chronic interstitial nephritis  . Hypertension   . Hyponatremia   . Migraines    on Zoloft for migraines  . Mild carotid artery disease ([place])    per duplex 08-30-2017 bilateral ICA 1-39%  . OA (osteoarthritis) rheumotologist-  dr [name]   both knees,  shoulders, ankles  . OSA on CPAP     moderate obstructive sleep apnea with an AHI of 23.4/h and oxygen desaturations as low as 84%.  Now on CPAP at 12 cm H2O.  [name] PAF (paroxysmal atrial fibrillation) [facility]) 2009   cardiologist-- dr [name]  . Paroxysmal atrial flutter ([place])    a. dx 11/2017.  [name] PSVT (paroxysmal supraventricular tachycardia) ([place])   . Wears contact lenses     Past Surgical History:  Procedure Laterality Date  . CESAREAN  SECTION  1980  . ESOPHAGOGASTRODUODENOSCOPY N/A 02/08/2015   Procedure: ESOPHAGOGASTRODUODENOSCOPY (EGD);  Surgeon: [name], MD;  Location: [facility];  Service: Endoscopy;  Laterality: N/A;  . KNEE ARTHROSCOPY W/ MENISCAL REPAIR Left 08/2014    @[facility]   . SHOULDER SURGERY Right 04/04/2016  . TOTAL KNEE ARTHROPLASTY Right 09/01/2018   Procedure: RIGHT TOTAL KNEE ARTHROPLASTY;  Surgeon: [name], MD;  Location[facility];  Service: Orthopedics;  Laterality: Right;     Social History:  The patient  reports that she has never smoked. She has never used smokeless tobacco. She reports current alcohol use. She reports that she does not use drugs.   Family History:  The patient's family history includes Breast cancer (age of onset: 26) in her sister; Lymphoma in her mother; Pancreatic cancer in her brother; Prostate cancer in her father.    ROS:  Please see the history of present illness. All other systems are reviewed and  Negative to the above problem except as noted.    PHYSICAL EXAM: VS:  BP 128/68   Pulse 72   Ht 5\' 5"  (1.651 m)   Wt 235 lb 9.6 oz (106.9 kg)   SpO2 97%   BMI 39.21 kg/m   GEN: Obese 74 yo  in no acute distress  HEENT: normal  Neck: JVP is normal  No carotid bruits Cardiac: RRR; no murmurs, rubs, or gallops,no LE edema  Respiratory:  clear to auscultation bilaterally, normal work of breathing GI: soft, nontender, nondistended, + BS  No hepatomegaly  MS: no deformity Moving all extremities   Skin: warm and dry, no rash Neuro:  Grossly intact   Psych: euthymic mood, full affect   EKG:  EKG is not  ordered today.   Lipid Panel    Component Value Date/Time   CHOL 167 07/03/2019 1458   TRIG 72 07/03/2019 1458   HDL 95 07/03/2019 1458   CHOLHDL 1.8 07/03/2019 1458   CHOLHDL 2 05/28/2018 0837   VLDL 13.8 05/28/2018 0837   LDLCALC 58 07/03/2019 1458      Wt Readings from Last 3 Encounters:  12/25/19 235 lb 9.6 oz (106.9 kg)  11/24/19 237 lb (107.5  kg)  07/03/19 232 lb 6.4 oz (105.4 kg)      ASSESSMENT AND PLAN:  1 PAF  Clinically in SR   Continue current regimen with Xarelto  2 Hx diastolic dysfunction.   Last labs BNP mildly elevated  433.   Echo showed mild diastolic dysfunction and R sided pressures increased   Exam without evid of volume overload.  Pt is on lasix now   I would repeat BMET and BNP now to see how compare to pre lasix labs   Keep on current regimen   3 Hx CP     Denies   Ca score is 0   Pt has mild plaquing of aorta  2  HTN  BP is controlled  Continue meds         3  CV dz Mild plaquing    4  HL  Keep on Crestor given atherosclerosis of aorta  LDL was 58 in Sept 2020  5  MR   Will follow with echoes  F/U in  September       Current medicines are reviewed at length with the patient today.  The patient does not have concerns regarding medicines.  Signed, [name], MD  12/25/2019 12:01 PM    [address], [place], [place]  [address] Phone: [phone]; Fax: [phone]

## 2019-12-25 ENCOUNTER — Ambulatory Visit (INDEPENDENT_AMBULATORY_CARE_PROVIDER_SITE_OTHER): Payer: Medicare Other | Admitting: Internal Medicine

## 2019-12-25 ENCOUNTER — Encounter: Payer: Self-pay | Admitting: Internal Medicine

## 2019-12-25 ENCOUNTER — Other Ambulatory Visit: Payer: Self-pay

## 2019-12-25 VITALS — BP 128/68 | HR 72 | Ht 65.0 in | Wt 235.6 lb

## 2019-12-25 DIAGNOSIS — I10 Essential (primary) hypertension: Secondary | ICD-10-CM | POA: Diagnosis not present

## 2019-12-25 DIAGNOSIS — R0609 Other forms of dyspnea: Secondary | ICD-10-CM

## 2019-12-25 DIAGNOSIS — R0600 Dyspnea, unspecified: Secondary | ICD-10-CM

## 2019-12-25 NOTE — Patient Instructions (Signed)
Medication Instructions:  NO CHANGES TODAY *If you need a refill on your cardiac medications before your next appointment, please call your pharmacy*   Lab Work: TODAY: BMET, BNP If you have labs (blood work) drawn today and your tests are completely normal, you will receive your results only by: [name] MyChart Message (if you have MyChart) OR . A paper copy in the mail If you have any lab test that is abnormal or we need to change your treatment, we will call you to review the results.   Testing/Procedures: NONE   Follow-Up: At [facility], you and your health needs are our priority.  As part of our continuing mission to provide you with exceptional heart care, we have created designated Provider Care Teams.  These Care Teams include your primary Cardiologist (physician) and Advanced Practice Providers (APPs -  Physician Assistants and Nurse Practitioners) who all work together to provide you with the care you need, when you need it.  Your next appointment:   6 month(s)  The format for your next appointment:   In Person  Provider:   [name], MD   Other Instructions

## 2019-12-26 LAB — PRO B NATRIURETIC PEPTIDE: NT-Pro BNP: 365 pg/mL — ABNORMAL HIGH (ref 0–301)

## 2019-12-26 LAB — BASIC METABOLIC PANEL
BUN/Creatinine Ratio: 18 (ref 12–28)
BUN: 13 mg/dL (ref 8–27)
CO2: 22 mmol/L (ref 20–29)
Calcium: 9.6 mg/dL (ref 8.7–10.3)
Chloride: 99 mmol/L (ref 96–106)
Creatinine, Ser: 0.74 mg/dL (ref 0.57–1.00)
GFR calc Af Amer: 93 mL/min/{1.73_m2} (ref >59)
GFR calc non Af Amer: 81 mL/min/{1.73_m2} (ref >59)
Glucose: 104 mg/dL — ABNORMAL HIGH (ref 65–99)
Potassium: 4.6 mmol/L (ref 3.5–5.2)
Sodium: 135 mmol/L (ref 134–144)

## 2019-12-29 DIAGNOSIS — E669 Obesity, unspecified: Secondary | ICD-10-CM | POA: Diagnosis not present

## 2019-12-29 DIAGNOSIS — M2550 Pain in unspecified joint: Secondary | ICD-10-CM | POA: Diagnosis not present

## 2019-12-29 DIAGNOSIS — M0609 Rheumatoid arthritis without rheumatoid factor, multiple sites: Secondary | ICD-10-CM | POA: Diagnosis not present

## 2019-12-29 DIAGNOSIS — M150 Primary generalized (osteo)arthritis: Secondary | ICD-10-CM | POA: Diagnosis not present

## 2019-12-29 DIAGNOSIS — Z79899 Other long term (current) drug therapy: Secondary | ICD-10-CM | POA: Diagnosis not present

## 2019-12-29 DIAGNOSIS — Z6838 Body mass index (BMI) 38.0-38.9, adult: Secondary | ICD-10-CM | POA: Diagnosis not present

## 2020-01-07 DIAGNOSIS — Z96651 Presence of right artificial knee joint: Secondary | ICD-10-CM | POA: Diagnosis not present

## 2020-01-07 DIAGNOSIS — M1712 Unilateral primary osteoarthritis, left knee: Secondary | ICD-10-CM | POA: Diagnosis not present

## 2020-01-08 DIAGNOSIS — M0609 Rheumatoid arthritis without rheumatoid factor, multiple sites: Secondary | ICD-10-CM | POA: Diagnosis not present

## 2020-01-08 DIAGNOSIS — Z79899 Other long term (current) drug therapy: Secondary | ICD-10-CM | POA: Diagnosis not present

## 2020-02-08 DIAGNOSIS — L918 Other hypertrophic disorders of the skin: Secondary | ICD-10-CM | POA: Diagnosis not present

## 2020-02-08 DIAGNOSIS — L820 Inflamed seborrheic keratosis: Secondary | ICD-10-CM | POA: Diagnosis not present
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
## 2020-02-16 ENCOUNTER — Ambulatory Visit (INDEPENDENT_AMBULATORY_CARE_PROVIDER_SITE_OTHER): Payer: Medicare Other | Admitting: Internal Medicine

## 2020-02-16 ENCOUNTER — Other Ambulatory Visit: Payer: Self-pay

## 2020-02-16 ENCOUNTER — Ambulatory Visit (HOSPITAL_COMMUNITY)
Admission: RE | Admit: 2020-02-16 | Discharge: 2020-02-16 | Disposition: A | Discharge status: Home / Self Care | Payer: Medicare Other | Source: Ambulatory Visit | Attending: Cardiovascular Disease | Admitting: Cardiovascular Disease

## 2020-02-16 ENCOUNTER — Encounter: Payer: Self-pay | Admitting: Internal Medicine

## 2020-02-16 VITALS — BP 154/84 | HR 70 | Temp 98.5°F | Resp 16 | Ht 65.0 in | Wt 238.0 lb

## 2020-02-16 DIAGNOSIS — M79662 Pain in left lower leg: Secondary | ICD-10-CM | POA: Diagnosis not present

## 2020-02-16 NOTE — Assessment & Plan Note (Signed)
Acute Pain started 2 days ago-no injury, travel or obvious cause No swelling, change in skin color or warmth Low risk for DVT given that she is on Xarelto and there has been no injury, no history of DVTs, but will go ahead and get an ultrasound to rule out a blood clot She has severe left knee OA and that could be causing some of the pain-ultrasound to evaluate for Baker's cyst Advised symptomatic treatment-pain is not severe so she will continue to take Tylenol, can apply heat, ice If no improvement and if ultrasound is negative advised follow-up with PCP orthopedics

## 2020-02-16 NOTE — Patient Instructions (Addendum)
You will have an ultrasound of your left leg today.    We will call you with the results today.    If there is no blood clot treat symptomatically with ice/heat and tylenol.  If your symptoms do not improve please follow up with Dr Alain Marion or your orthopedic.

## 2020-02-17 ENCOUNTER — Other Ambulatory Visit: Payer: Self-pay

## 2020-02-18 ENCOUNTER — Ambulatory Visit (INDEPENDENT_AMBULATORY_CARE_PROVIDER_SITE_OTHER): Payer: Medicare Other | Admitting: Obstetrics & Gynecology

## 2020-02-18 ENCOUNTER — Encounter: Payer: Self-pay | Admitting: Obstetrics & Gynecology

## 2020-02-18 VITALS — BP 120/82 | Ht 64.5 in | Wt 236.0 lb

## 2020-02-18 DIAGNOSIS — N390 Urinary tract infection, site not specified: Secondary | ICD-10-CM

## 2020-02-18 DIAGNOSIS — Z6839 Body mass index (BMI) 39.0-39.9, adult: Secondary | ICD-10-CM

## 2020-02-18 DIAGNOSIS — Z9189 Other specified personal risk factors, not elsewhere classified: Secondary | ICD-10-CM

## 2020-02-18 DIAGNOSIS — Z01419 Encounter for gynecological examination (general) (routine) without abnormal findings: Secondary | ICD-10-CM

## 2020-02-18 DIAGNOSIS — Z780 Asymptomatic menopausal state: Secondary | ICD-10-CM | POA: Diagnosis not present

## 2020-02-18 MED ORDER — SULFAMETHOXAZOLE-TRIMETHOPRIM 800-160 MG PO TABS: 1.0000 | ORAL_TABLET | Freq: Two times a day (BID) | ORAL | 0 refills | Status: AC

## 2020-02-18 NOTE — Progress Notes (Signed)
Amanda Wilkins 26-Jul-1946 [id number]   History:    74 y.o. G2P2L2 Remarried x about 20 years.  RP:  Established patient presenting for annual gyn exam   HPI:  Postmenopause.  No HRT x 05/2018.  No PMB since last visit 07/2018.  Hot flushes every day, but tolerable.  No pelvic pain.  Sexually active occasionally with no pain.  Urine/BMs wnl.  Breasts wnl. BMI 39.88.  Health labs with Fam MD.  [place] 2012.  Past medical history,surgical history, family history and social history were all reviewed and documented in the EPIC chart.  Gynecologic History No LMP recorded. Patient is postmenopausal.  Obstetric History OB History  Gravida Para Term Preterm AB Living  2 2 2     2   SAB TAB Ectopic Multiple Live Births          2    # Outcome Date GA Lbr Len/2nd Weight Sex Delivery Anes PTL Lv  2 Term     M Vag-Spont  N LIV  1 Term     M CS-Unspec  N LIV     ROS: A ROS was performed and pertinent positives and negatives are included in the history.  GENERAL: No fevers or chills. HEENT: No change in vision, no earache, sore throat or sinus congestion. NECK: No pain or stiffness. CARDIOVASCULAR: No chest pain or pressure. No palpitations. PULMONARY: No shortness of breath, cough or wheeze. GASTROINTESTINAL: No abdominal pain, nausea, vomiting or diarrhea, melena or bright red blood per rectum. GENITOURINARY: No urinary frequency, urgency, hesitancy or dysuria. MUSCULOSKELETAL: No joint or muscle pain, no back pain, no recent trauma. DERMATOLOGIC: No rash, no itching, no lesions. ENDOCRINE: No polyuria, polydipsia, no heat or cold intolerance. No recent change in weight. HEMATOLOGICAL: No anemia or easy bruising or bleeding. NEUROLOGIC: No headache, seizures, numbness, tingling or weakness. PSYCHIATRIC: No depression, no loss of interest in normal activity or change in sleep pattern.     Exam:   BP 120/82   Ht 5' 4.5" (1.638 m)   Wt 236 lb (107 kg)   BMI 39.88 kg/m   Body mass  index is 39.88 kg/m.  General appearance : Well developed well nourished female. No acute distress HEENT: Eyes: no retinal hemorrhage or exudates,  Neck supple, trachea midline, no carotid bruits, no thyroidmegaly Lungs: Clear to auscultation, no rhonchi or wheezes, or rib retractions  Heart: Regular rate and rhythm, no murmurs or gallops Breast:Examined in sitting and supine position were symmetrical in appearance, no palpable masses or tenderness,  no skin retraction, no nipple inversion, no nipple discharge, no skin discoloration, no axillary or supraclavicular lymphadenopathy Abdomen: no palpable masses or tenderness, no rebound or guarding Extremities: no edema or skin discoloration or tenderness  Pelvic: Vulva: Normal             Vagina: No gross lesions or discharge  Cervix: No gross lesions or discharge  Uterus AV, normal size, shape and consistency, non-tender and mobile  Adnexa  Without masses or tenderness  Anus: Normal  U/A: Yellow cloudy, nitrites negative, protein trace, white blood cells 40-60, red blood cells 0-2, bacteria moderate.  Urine culture pending.   Assessment/Plan:  74 y.o. female for annual exam   1. Well female exam with routine gynecological exam Normal gynecologic exam in menopause.  Last Pap test July 2019, no indication to repeat the Pap test this year.  Breast exam normal.  Screening mammogram September 2020.  Colonoscopy 2012.  Health labs with family physician.  2. Postmenopause Well on no hormone replacement therapy.  No postmenopausal bleeding.  Last bone density August 2018 was normal, will repeat at 5 years.  Vitamin D supplements, calcium intake of 1200 mg daily and regular weightbearing physical activities.  3. Urinary tract infection without hematuria, site unspecified Probable acute cystitis per UTI symptoms and urine analysis.  Will treat with Bactrim DS 1 tablet twice a day for 3 days.  Usage reviewed and prescription sent to pharmacy.   Pending urine culture.  4. Class 2 severe obesity due to excess calories with serious comorbidity and body mass index (BMI) of 39.0 to 39.9 in adult [facility]) Recommend a lower calorie/carb diet such as [employer].  Aerobic activities 5 times a week and light weightlifting every 2 days.  Other orders - sulfamethoxazole-trimethoprim (BACTRIM DS) 800-160 MG tablet; Take 1 tablet by mouth 2 (two) times daily for 3 days.  Princess Bruins MD, 2:14 PM 02/18/2020

## 2020-02-19 LAB — URINALYSIS, COMPLETE W/RFL CULTURE
Bilirubin Urine: NEGATIVE
Glucose, UA: NEGATIVE
Hyaline Cast: NONE SEEN /LPF
Ketones, ur: NEGATIVE
Nitrites, Initial: NEGATIVE
Specific Gravity, Urine: 1.015 (ref 1.001–1.03)
pH: 7.5 (ref 5.0–8.0)

## 2020-02-19 LAB — URINE CULTURE
MICRO NUMBER:: 10447367
SPECIMEN QUALITY:: ADEQUATE

## 2020-02-19 LAB — CULTURE INDICATED

## 2020-02-22 ENCOUNTER — Encounter: Payer: Self-pay | Admitting: Obstetrics & Gynecology

## 2020-02-22 NOTE — Patient Instructions (Signed)
1. Well female exam with routine gynecological exam Normal gynecologic exam in menopause.  Last Pap test July 2019, no indication to repeat the Pap test this year.  Breast exam normal.  Screening mammogram September 2020.  Colonoscopy 2012.  Health labs with family physician.  2. Postmenopause Well on no hormone replacement therapy.  No postmenopausal bleeding.  Last bone density August 2018 was normal, will repeat at 5 years.  Vitamin D supplements, calcium intake of 1200 mg daily and regular weightbearing physical activities.  3. Urinary tract infection without hematuria, site unspecified Probable acute cystitis per UTI symptoms and urine analysis.  Will treat with Bactrim DS 1 tablet twice a day for 3 days.  Usage reviewed and prescription sent to pharmacy.  Pending urine culture.  4. Class 2 severe obesity due to excess calories with serious comorbidity and body mass index (BMI) of 39.0 to 39.9 in adult [facility]) Recommend a lower calorie/carb diet such as [employer].  Aerobic activities 5 times a week and light weightlifting every 2 days.  Other orders - sulfamethoxazole-trimethoprim (BACTRIM DS) 800-160 MG tablet; Take 1 tablet by mouth 2 (two) times daily for 3 days.  Amanda Wilkins, it was a pleasure seeing you today!  I will inform you of your results as soon as they are available.

## 2020-03-04 DIAGNOSIS — I10 Essential (primary) hypertension: Secondary | ICD-10-CM | POA: Diagnosis not present

## 2020-03-04 DIAGNOSIS — E669 Obesity, unspecified: Secondary | ICD-10-CM | POA: Diagnosis not present

## 2020-03-04 DIAGNOSIS — Z6837 Body mass index (BMI) 37.0-37.9, adult: Secondary | ICD-10-CM | POA: Diagnosis not present

## 2020-03-15 ENCOUNTER — Other Ambulatory Visit: Payer: Self-pay | Admitting: Internal Medicine

## 2020-05-05 ENCOUNTER — Ambulatory Visit (INDEPENDENT_AMBULATORY_CARE_PROVIDER_SITE_OTHER): Payer: Medicare Other

## 2020-05-05 ENCOUNTER — Other Ambulatory Visit: Payer: Self-pay

## 2020-05-05 ENCOUNTER — Telehealth: Payer: Self-pay | Admitting: Internal Medicine

## 2020-05-05 VITALS — BP 122/80 | HR 66 | Temp 98.2°F | Resp 16 | Ht 65.0 in | Wt 238.2 lb

## 2020-05-05 DIAGNOSIS — R0602 Shortness of breath: Secondary | ICD-10-CM

## 2020-05-05 DIAGNOSIS — Z0000 Encounter for general adult medical examination without abnormal findings: Secondary | ICD-10-CM | POA: Diagnosis not present

## 2020-05-05 DIAGNOSIS — R0609 Other forms of dyspnea: Secondary | ICD-10-CM

## 2020-05-05 NOTE — Telephone Encounter (Signed)
Pt c/o Shortness Of Breath: STAT if SOB developed within the last 24 hours or pt is noticeably SOB on the phone  1. Are you currently SOB (can you hear that pt is SOB on the phone)? No  2. How long have you been experiencing SOB? About 1 year on and off   3. Are you SOB when sitting or when up moving around? When up and moving around   4. Are you currently experiencing any other symptoms? No

## 2020-05-05 NOTE — Telephone Encounter (Signed)
That is fine  Have her scheduled for a cardiopulmonary stress test

## 2020-05-05 NOTE — Progress Notes (Addendum)
Subjective:   Amanda Wilkins is a 74 y.o. female who presents for Medicare Annual (Subsequent) preventive examination.  Review of Systems    No ROS. Medicare Wellness Visit Cardiac Risk Factors include: advanced age (>71men, >103 women);dyslipidemia;obesity (BMI >30kg/m2)     Objective:    Today's Vitals   05/05/20 1139  BP: 122/80  Pulse: 66  Resp: 16  Temp: 98.2 F (36.8 C)  SpO2: 99%  Weight: (!) 238 lb 3.2 oz (108 kg)  Height: 5\' 5"  (1.651 m)  PainSc: 0-No pain   Body mass index is 39.64 kg/m.  Advanced Directives 05/05/2020 05/05/2019 09/02/2018 09/01/2018 08/26/2018 05/31/2018 02/07/2018  Does Patient Have a Medical Advance Directive? Yes Yes Yes Yes Yes Yes Yes  Type of Advance Directive - [place];Living will Healthcare Power of [place] of [place];Living will [place];Living will Living will  Does patient want to make changes to medical advance directive? No - Patient declined - No - Patient declined No - Patient declined - - -  Copy of [place] in Chart? - No - copy requested No - copy requested No - copy requested No - copy requested No - copy requested No - copy requested  Would patient like information on creating a medical advance directive? - - - - - - -    Current Medications (verified) Outpatient Encounter Medications as of 05/05/2020  Medication Sig   acetaminophen (TYLENOL) 500 MG tablet Take 1,000 mg by mouth every 8 (eight) hours as needed (pain).   atenolol (TENORMIN) 50 MG tablet TAKE 1 TABLET BY MOUTH DAILY   cholecalciferol (VITAMIN D3) 25 MCG (1000 UT) tablet Take 1,000 Units by mouth daily.   furosemide (LASIX) 20 MG tablet Take 1 tablet by mouth on Mondays Wednesdays Fridays   hydroxychloroquine (PLAQUENIL) 200 MG tablet Take 200 mg by mouth every morning.    Multiple Vitamin (MULTIVITAMIN) capsule Take 1 capsule by mouth daily.    pantoprazole (PROTONIX) 40 MG tablet Take 1 tablet (40 mg total) by mouth daily.   potassium chloride (KLOR-CON) 10 MEQ tablet Take 1 tablet by mouth on Mondays Wednesdays Fridays   rosuvastatin (CRESTOR) 5 MG tablet TAKE 1 TABLET(5 MG) BY MOUTH DAILY   sertraline (ZOLOFT) 50 MG tablet TAKE 1 AND 1/2 TABLET BY MOUTH DAILY   XARELTO 20 MG TABS tablet TAKE 1 TABLET BY MOUTH DAILY WITH SUPPER   No facility-administered encounter medications on file as of 05/05/2020.    Allergies (verified) Epinephrine base, Aspirin, Penicillins, Synvisc [hylan g-f 20], and Tape   History: Past Medical History:  Diagnosis Date   Anticoagulant long-term use    Xarelto   Chronic interstitial nephritis    Depression    History of interstitial nephritis 2016   chronic   History of nuclear stress test 12/16/2014   Intermediate risk nuclear study w/ medium size moderate severity reversible defect in the basal and mid inferolateral and inferior wall (per dr cardiology note , dr [name] did not think this was consistent with ischemia)/  normal LV function and wall motion, ef 75%   History of septic shock 01/22/2015   in setting Group A Strep Cellulits erysipelas/chest wall induration with streptoccocus basterium-- Severe sepsis, DIC, Acute respiratory failure with pulmonary edema, Acute Kidney failure with chronic interstitial nephritis   Hypertension    Hyponatremia    Migraines    on Zoloft for migraines   Mild carotid artery  disease ([place])    per duplex 08-30-2017 bilateral ICA 1-39%   OA (osteoarthritis) rheumotologist-  dr [name]   both knees,  shoulders, ankles   OSA on CPAP     moderate obstructive sleep apnea with an AHI of 23.4/h and oxygen desaturations as low as 84%.  Now on CPAP at 12 cm H2O.   PAF (paroxysmal atrial fibrillation) [facility]) 2009   cardiologist-- dr [name]   Paroxysmal atrial flutter [facility])    a. dx 11/2017.   PSVT (paroxysmal supraventricular tachycardia) ([place])    Wears  contact lenses    Past Surgical History:  Procedure Laterality Date   CESAREAN SECTION  1980   ESOPHAGOGASTRODUODENOSCOPY N/A 02/08/2015   Procedure: ESOPHAGOGASTRODUODENOSCOPY (EGD);  Surgeon: [name], MD;  Location: [place];  Service: Endoscopy;  Laterality: N/A;   KNEE ARTHROSCOPY W/ MENISCAL REPAIR Left 08/2014    @[facility]    SHOULDER SURGERY Right 04/04/2016   TOTAL KNEE ARTHROPLASTY Right 09/01/2018   Procedure: RIGHT TOTAL KNEE ARTHROPLASTY;  Surgeon: [name], MD;  Location: [facility];  Service: Orthopedics;  Laterality: Right;   Family History  Problem Relation Age of Onset   Pancreatic cancer Brother    Lymphoma Mother    Prostate cancer Father        metastatic prostate cancer   Breast cancer Sister 43   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)  Tobacco Use   Smoking status: Never Smoker   Smokeless tobacco: Never Used   Tobacco comment: H/O social smoking at times when drinking--never a "habit" (in the 1970s)  Vaping Use   Vaping Use: Never used  Substance and Sexual Activity   Alcohol use: Yes    Alcohol/week: 0.0 standard drinks    Comment: Occasional   Drug use: Never   Sexual activity: Yes    Partners: Male    Birth control/protection: Post-menopausal    Comment: intercourse age 29, sexual partners more than 5  Other Topics Concern   Not on file  Social History Narrative   Not on file   Social Determinants of Health   Financial Resource Strain: Low Risk    Difficulty of Paying Living Expenses: Not hard at all  Food Insecurity: No Food Insecurity   Worried About Charity fundraiser in the Last Year: Never true   [place] in the Last Year: Never true  Transportation Needs:  No Transportation Needs   Lack of Transportation (Medical): No   Lack of Transportation (Non-Medical): No  Physical Activity: Sufficiently Active   Days of Exercise per Week: 5 days   Minutes of Exercise per Session: 60 min  Stress: No Stress Concern Present   Feeling of Stress : Not at all  Social Connections: Socially Integrated   Frequency of Communication with Friends and Family: More than three times a week   Frequency of Social Gatherings with Friends and Family: More than three times a week   Attends Religious Services: More than 4 times per year   Active Member of [employer] or Organizations: Yes   Attends Archivist Meetings: More than 4 times per year   Marital Status: Married  Tobacco Counseling Counseling given: No Comment: H/O social smoking at times when drinking--never a "habit" (in the 1970s)   Clinical Intake:  Pre-visit preparation completed: Yes  Pain : No/denies pain Pain Score: 0-No pain     BMI - recorded: 39.64 Nutritional Status: BMI > 30  Obese Nutritional Risks: None Diabetes: No  How often do you need to have someone help you when you read instructions, pamphlets, or other written materials from your doctor or pharmacy?: 1 - Never What is the last grade level you completed in school?: HSG  Diabetic? no  Interpreter Needed?: No  Information entered by :: Muadh Creasy N. Alberto Schoch, LPN   Activities of Daily Living In your present state of health, do you have any difficulty performing the following activities: 05/05/2020  Hearing? N  Vision? N  Difficulty concentrating or making decisions? N  Walking or climbing stairs? N  Dressing or bathing? N  Doing errands, shopping? N  Preparing Food and eating ? N  Using the Toilet? N  In the past six months, have you accidently leaked urine? N  Do you have problems with loss of bowel control? N  Managing your Medications? N  Managing your Finances? N  Housekeeping or managing your  Housekeeping? N  Some recent data might be hidden    Patient Care Team: Plotnikov, Evie Lacks, MD as PCP - General Fay Records, MD as PCP - Cardiology (Cardiology) Terrance Mass, MD (Inactive) as Consulting Physician (Gynecology) Leanora Cover, MD as Consulting Physician (Orthopedic Surgery) Cameron Sprang, MD as Consulting Physician (Neurology) [name], MD as Consulting Physician (Orthopedic Surgery) Regal, Tamala Fothergill, DPM as Consulting Physician (Podiatry) Royetta Crochet (Psychology)  Indicate any recent Medical Services you may have received from other than [facility] providers in the past year (date may be approximate).     Assessment:   This is a routine wellness examination for [facility].  Hearing/Vision screen No exam data present  Dietary issues and exercise activities discussed: Current Exercise Habits: Home exercise routine, Type of exercise: walking;strength training/weights;stretching, Time (Minutes): 40, Frequency (Times/Week): 5, Weekly Exercise (Minutes/Week): 200, Intensity: Moderate, Exercise limited by: orthopedic condition(s)  Goals       Patient Stated (pt-stated)      Continue to lose weight and get back down to 200 pounds.      Stay healthy to keep my  heart strong      Continue to lose weight, eat healthy and exercise.       Depression Screen PHQ 2/9 Scores 05/05/2020 05/05/2019 12/31/2016 12/31/2016 08/30/2015 07/12/2014 07/05/2014  PHQ - 2 Score 0 0 1 1 0 0 0  PHQ- 9 Score - 0 - - - - -    Fall Risk Fall Risk  05/05/2020 05/13/2019 05/05/2019 06/18/2018 12/31/2016  Falls in the past year? 0 0 0 Yes Yes  Comment - Emmi Telephone Survey: data to providers prior to load - - -  Number falls in past yr: 0 - 0 1 1  Injury with Fall? 0 - - Yes No  Risk for fall due to : Orthopedic patient - - - Impaired balance/gait  Follow up Falls evaluation completed - - - Falls prevention discussed    Any stairs in or around the home? Yes  If so, are there any without  handrails? No  Home free of loose throw rugs in walkways, pet beds, electrical cords, etc? Yes  Adequate lighting in your home to reduce risk of falls? Yes   ASSISTIVE  DEVICES UTILIZED TO PREVENT FALLS:  Life alert? No  Use of a cane, walker or w/c? No  Grab bars in the bathroom? No  Shower chair or bench in shower? No  Elevated toilet seat or a handicapped toilet? No   TIMED UP AND GO:  Was the test performed? No .  Length of time to ambulate 10 feet: 0 sec.   Gait steady and fast without use of assistive device  Cognitive Function:     6CIT Screen 05/05/2020  What Year? 0 points  What month? 0 points  What time? 0 points  Count back from 20 0 points  Months in reverse 0 points  Repeat phrase 0 points  Total Score 0    Immunizations Immunization History  Administered Date(s) Administered   Fluad Quad(high Dose 65+) 08/03/2019   Influenza Split 09/24/2011, 09/24/2012   Influenza Whole 08/09/2010   Influenza, High Dose Seasonal PF 08/16/2016, 07/26/2017, 07/16/2018   Influenza,inj,Quad PF,6+ Mos 07/14/2013, 07/27/2014, 06/14/2015   PFIZER SARS-COV-2 Vaccination 11/05/2019, 11/26/2019   Pneumococcal Conjugate-13 05/25/2014   Pneumococcal Polysaccharide-23 06/21/2015   Tdap 01/06/2013   Zoster 01/24/2011    TDAP status: Up to date Flu Vaccine status: Up to date Pneumococcal vaccine status: Up to date Covid-19 vaccine status: Completed vaccines  Qualifies for Shingles Vaccine? Yes   Zostavax completed Yes   Shingrix Completed?: No.    Education has been provided regarding the importance of this vaccine. Patient has been advised to call insurance company to determine out of pocket expense if they have not yet received this vaccine. Advised may also receive vaccine at local pharmacy or Health Dept. Verbalized acceptance and understanding.  Screening Tests Health Maintenance  Topic Date Due   INFLUENZA VACCINE  05/15/2020   COLONOSCOPY  01/28/2021   MAMMOGRAM   07/08/2021   TETANUS/TDAP  01/07/2023   DEXA SCAN  Completed   COVID-19 Vaccine  Completed   Hepatitis C Screening  Completed   PNA vac Low Risk Adult  Completed    Health Maintenance  There are no preventive care reminders to display for this patient.  Colorectal cancer screening: Completed 01/29/2011. Repeat every 10 years Mammogram status: Completed 07/09/2019. Repeat every year Bone Density status: Completed 05/30/2017. Results reflect: Bone density results: NORMAL. Repeat every 0 years. (Patient was advised from provider that she did not need to repeat bone density)  Lung Cancer Screening: (Low Dose CT Chest recommended if Age 58-80 years, 30 pack-year currently smoking OR have quit w/in 15years.) does not qualify.   Lung Cancer Screening Referral: no  Additional Screening:  Hepatitis C Screening: does qualify; Completed yes  Vision Screening: Recommended annual ophthalmology exams for early detection of glaucoma and other disorders of the eye. Is the patient up to date with their annual eye exam?  Yes  Who is the provider or what is the name of the office in which the patient attends annual eye exams? [place] If pt is not established with a provider, would they like to be referred to a provider to establish care? No .   Dental Screening: Recommended annual dental exams for proper oral hygiene  Community Resource Referral / Chronic Care Management: CRR required this visit?  No   CCM required this visit?  No      Plan:     I have personally reviewed and noted the following in the patient's chart:   Medical and social history Use of alcohol, tobacco or illicit drugs  Current medications and supplements Functional ability and status Nutritional status Physical activity Advanced directives List of other physicians Hospitalizations, surgeries, and ER visits in previous 12 months Vitals Screenings to include cognitive, depression, and  falls Referrals and appointments  In addition, I have reviewed and discussed with patient certain preventive protocols, quality metrics, and best practice recommendations. A written personalized care plan for preventive services as well as general preventive health recommendations were provided to patient.     Sheral Flow, LPN   [id number]/[id number]   Nurse Notes: n/a  Medical screening examination/treatment/procedure(s) were performed by non-physician practitioner and as supervising physician I was immediately available for consultation/collaboration.  I agree with above. Lew Dawes, MD

## 2020-05-05 NOTE — Telephone Encounter (Signed)
I called the patient back and was able to move up her 6 month follow up to early September w Dr. [name].  She wanted to ask Dr. [name] if a cardiopulmonary stress test would help determine reasons for SOB.  She is aware I will forward to Dr. [name] and call her back if any new recommendations.

## 2020-05-05 NOTE — Patient Instructions (Signed)
Amanda Wilkins , Thank you for taking time to come for your Medicare Wellness Visit. I appreciate your ongoing commitment to your health goals. Please review the following plan we discussed and let me know if I can assist you in the future.   Screening recommendations/referrals: Colonoscopy: 01/29/2011; due every 10 years Mammogram: 07/09/2019 Bone Density: 05/30/2017; due every 2-3 years Recommended yearly ophthalmology/optometry visit for glaucoma screening and checkup Recommended yearly dental visit for hygiene and checkup  Vaccinations: Influenza vaccine: 08/03/2019 Pneumococcal vaccine: completed Tdap vaccine: 01/06/2013; due every 10 years Shingles vaccine: never done   Covid-19: completed  Advanced directives: Please bring a copy of your health care power of attorney and living will to the office at your convenience.   Conditions/risks identified: Yes; Please continue to do your personal lifestyle choices by: daily care of teeth and gums, regular physical activity (goal should be 5 days a week for 30 minutes), eat a healthy diet, avoid tobacco and drug use, limiting any alcohol intake, taking a low-dose aspirin (if not allergic or have been advised by your provider otherwise) and taking vitamins and minerals as recommended by your provider. Continue doing brain stimulating activities (puzzles, reading, adult coloring books, staying active) to keep memory sharp. Continue to eat heart healthy diet (full of fruits, vegetables, whole grains, lean protein, water--limit salt, fat, and sugar intake) and increase physical activity as tolerated.  Next appointment: Please schedule your next Medicare Wellness Visit with your Nurse Health Advisor in 1 year.  Preventive Care 30 Years and Older, Female Preventive care refers to lifestyle choices and visits with your health care provider that can promote health and wellness. What does preventive care include?  A yearly physical exam. This is also  called an annual well check.  Dental exams once or twice a year.  Routine eye exams. Ask your health care provider how often you should have your eyes checked.  Personal lifestyle choices, including:  Daily care of your teeth and gums.  Regular physical activity.  Eating a healthy diet.  Avoiding tobacco and drug use.  Limiting alcohol use.  Practicing safe sex.  Taking low-dose aspirin every day.  Taking vitamin and mineral supplements as recommended by your health care provider. What happens during an annual well check? The services and screenings done by your health care provider during your annual well check will depend on your age, overall health, lifestyle risk factors, and family history of disease. Counseling  Your health care provider may ask you questions about your:  Alcohol use.  Tobacco use.  Drug use.  Emotional well-being.  Home and relationship well-being.  Sexual activity.  Eating habits.  History of falls.  Memory and ability to understand (cognition).  Work and work Statistician.  Reproductive health. Screening  You may have the following tests or measurements:  Height, weight, and BMI.  Blood pressure.  Lipid and cholesterol levels. These may be checked every 5 years, or more frequently if you are over 32 years old.  Skin check.  Lung cancer screening. You may have this screening every year starting at age 26 if you have a 30-pack-year history of smoking and currently smoke or have quit within the past 15 years.  Fecal occult blood test (FOBT) of the stool. You may have this test every year starting at age 64.  Flexible sigmoidoscopy or colonoscopy. You may have a sigmoidoscopy every 5 years or a colonoscopy every 10 years starting at age 75.  Hepatitis C blood test.  Hepatitis  B blood test.  Sexually transmitted disease (STD) testing.  Diabetes screening. This is done by checking your blood sugar (glucose) after you have not  eaten for a while (fasting). You may have this done every 1-3 years.  Bone density scan. This is done to screen for osteoporosis. You may have this done starting at age 34.  Mammogram. This may be done every 1-2 years. Talk to your health care provider about how often you should have regular mammograms. Talk with your health care provider about your test results, treatment options, and if necessary, the need for more tests. Vaccines  Your health care provider may recommend certain vaccines, such as:  Influenza vaccine. This is recommended every year.  Tetanus, diphtheria, and acellular pertussis (Tdap, Td) vaccine. You may need a Td booster every 10 years.  Zoster vaccine. You may need this after age 29.  Pneumococcal 13-valent conjugate (PCV13) vaccine. One dose is recommended after age 31.  Pneumococcal polysaccharide (PPSV23) vaccine. One dose is recommended after age 12. Talk to your health care provider about which screenings and vaccines you need and how often you need them. This information is not intended to replace advice given to you by your health care provider. Make sure you discuss any questions you have with your health care provider. Document Released: 10/28/2015 Document Revised: 06/20/2016 Document Reviewed: 08/02/2015 Elsevier Interactive Patient Education  2017 [place] Prevention in the Home Falls can cause injuries. They can happen to people of all ages. There are many things you can do to make your home safe and to help prevent falls. What can I do on the outside of my home?  Regularly fix the edges of walkways and driveways and fix any cracks.  Remove anything that might make you trip as you walk through a door, such as a raised step or threshold.  Trim any bushes or trees on the path to your home.  Use bright outdoor lighting.  Clear any walking paths of anything that might make someone trip, such as rocks or tools.  Regularly check to see if  handrails are loose or broken. Make sure that both sides of any steps have handrails.  Any raised decks and porches should have guardrails on the edges.  Have any leaves, snow, or ice cleared regularly.  Use sand or salt on walking paths during winter.  Clean up any spills in your garage right away. This includes oil or grease spills. What can I do in the bathroom?  Use night lights.  Install grab bars by the toilet and in the tub and shower. Do not use towel bars as grab bars.  Use non-skid mats or decals in the tub or shower.  If you need to sit down in the shower, use a plastic, non-slip stool.  Keep the floor dry. Clean up any water that spills on the floor as soon as it happens.  Remove soap buildup in the tub or shower regularly.  Attach bath mats securely with double-sided non-slip rug tape.  Do not have throw rugs and other things on the floor that can make you trip. What can I do in the bedroom?  Use night lights.  Make sure that you have a light by your bed that is easy to reach.  Do not use any sheets or blankets that are too big for your bed. They should not hang down onto the floor.  Have a firm chair that has side arms. You can use this for  support while you get dressed.  Do not have throw rugs and other things on the floor that can make you trip. What can I do in the kitchen?  Clean up any spills right away.  Avoid walking on wet floors.  Keep items that you use a lot in easy-to-reach places.  If you need to reach something above you, use a strong step stool that has a grab bar.  Keep electrical cords out of the way.  Do not use floor polish or wax that makes floors slippery. If you must use wax, use non-skid floor wax.  Do not have throw rugs and other things on the floor that can make you trip. What can I do with my stairs?  Do not leave any items on the stairs.  Make sure that there are handrails on both sides of the stairs and use them. Fix  handrails that are broken or loose. Make sure that handrails are as long as the stairways.  Check any carpeting to make sure that it is firmly attached to the stairs. Fix any carpet that is loose or worn.  Avoid having throw rugs at the top or bottom of the stairs. If you do have throw rugs, attach them to the floor with carpet tape.  Make sure that you have a light switch at the top of the stairs and the bottom of the stairs. If you do not have them, ask someone to add them for you. What else can I do to help prevent falls?  Wear shoes that:  Do not have high heels.  Have rubber bottoms.  Are comfortable and fit you well.  Are closed at the toe. Do not wear sandals.  If you use a stepladder:  Make sure that it is fully opened. Do not climb a closed stepladder.  Make sure that both sides of the stepladder are locked into place.  Ask someone to hold it for you, if possible.  Clearly mark and make sure that you can see:  Any grab bars or handrails.  First and last steps.  Where the edge of each step is.  Use tools that help you move around (mobility aids) if they are needed. These include:  Canes.  Walkers.  Scooters.  Crutches.  Turn on the lights when you go into a dark area. Replace any light bulbs as soon as they burn out.  Set up your furniture so you have a clear path. Avoid moving your furniture around.  If any of your floors are uneven, fix them.  If there are any pets around you, be aware of where they are.  Review your medicines with your doctor. Some medicines can make you feel dizzy. This can increase your chance of falling. Ask your doctor what other things that you can do to help prevent falls. This information is not intended to replace advice given to you by your health care provider. Make sure you discuss any questions you have with your health care provider. Document Released: 07/28/2009 Document Revised: 03/08/2016 Document Reviewed:  11/05/2014 Elsevier Interactive Patient Education  2017 [employer].

## 2020-05-06 NOTE — Telephone Encounter (Signed)
Order placed. Patient informed she will receive a call to schedule this.

## 2020-05-19 ENCOUNTER — Telehealth: Payer: Self-pay | Admitting: Internal Medicine

## 2020-05-19 ENCOUNTER — Other Ambulatory Visit: Payer: Self-pay

## 2020-05-19 ENCOUNTER — Ambulatory Visit (INDEPENDENT_AMBULATORY_CARE_PROVIDER_SITE_OTHER): Payer: Medicare Other | Admitting: Internal Medicine

## 2020-05-19 ENCOUNTER — Encounter: Payer: Self-pay | Admitting: Internal Medicine

## 2020-05-19 VITALS — BP 126/76 | HR 76 | Temp 98.1°F | Ht 65.0 in | Wt 242.0 lb

## 2020-05-19 DIAGNOSIS — R109 Unspecified abdominal pain: Secondary | ICD-10-CM | POA: Diagnosis not present

## 2020-05-19 LAB — POCT URINALYSIS DIPSTICK
Bilirubin, UA: NEGATIVE
Blood, UA: NEGATIVE
Glucose, UA: NEGATIVE
Ketones, UA: NEGATIVE
Leukocytes, UA: NEGATIVE
Nitrite, UA: NEGATIVE
Protein, UA: NEGATIVE
Spec Grav, UA: 1.01 (ref 1.010–1.025)
Urobilinogen, UA: 0.2 E.U./dL
pH, UA: 6 (ref 5.0–8.0)

## 2020-05-19 MED ORDER — FUROSEMIDE 20 MG PO TABS
ORAL_TABLET | ORAL | 3 refills | Status: DC
Start: 2020-05-19 — End: 2020-05-19

## 2020-05-19 MED ORDER — FUROSEMIDE 20 MG PO TABS
ORAL_TABLET | ORAL | 3 refills | Status: DC
Start: 2020-05-19 — End: 2020-06-14

## 2020-05-19 NOTE — Telephone Encounter (Signed)
Patient having pain in her sides, flank around waist to front bilat but only when turning in bed.  No urinary symptoms.  No fevers. Staying hydrated.  Wants to stop lasix because it is not making any difference w her SOB.  She watches sodium.  I adv her to cut from three times a week to twice weekly and I will let Dr. [name] know.  Aware I'll call her if different recommendations.  Adv to contact PCP re: pain w turning in bed.  She is active and exercises and does not feel this pain any other times.   Had urine checked in May, going to ask for repeat when calls PCP.  CPX scheduled 8/17 and follow up w Dr. [name] on 9/3.

## 2020-05-19 NOTE — Patient Instructions (Signed)
Stop taking the lasix (furosemide).   We are checking the labs today and have ordered a CT scan to check the kidneys.

## 2020-05-19 NOTE — Telephone Encounter (Signed)
Left message for patient to call back  

## 2020-05-19 NOTE — Telephone Encounter (Signed)
Patient called to return Michalene's call, transferred call to [facility].

## 2020-05-19 NOTE — Telephone Encounter (Signed)
Patient has been notified.  She has seen PCP today.  Did urine, blood.   She does not want to take any more Lasix at all.  Is not going to taper.   The pain is worse this afternoon.  Scheduled for CT tomorrow.

## 2020-05-19 NOTE — Progress Notes (Signed)
   Subjective:   Patient ID: Amanda Wilkins, female    DOB: [birth year], 74 y.o.   MRN: [id number]  HPI The patient is a 74 YO female coming in for concerns about pain in the back/flank. Started months ago when she was started on lasix by cardiology. Worsened in the last few days and is now severe. She called her cardiologist and they told her to decrease from 3 days a week to 2 days a week lasix but she just wants to stop. They were trying this for her SOB which has not improved. Denies chest pains. Denies urinary symptoms such as burning, frequency, urgency, pain in low abdomen. Pain does not radiate from the back/flank. Denies vomiting. Perhaps mild nausea.   Review of Systems  Constitutional: Negative.   HENT: Negative.   Eyes: Negative.   Respiratory: Negative for cough, chest tightness and shortness of breath.   Cardiovascular: Negative for chest pain, palpitations and leg swelling.  Gastrointestinal: Positive for nausea. Negative for abdominal distention, abdominal pain, anal bleeding, blood in stool, constipation, diarrhea, rectal pain and vomiting.  Genitourinary: Positive for flank pain.  Skin: Negative.   Neurological: Negative.   Psychiatric/Behavioral: Negative.     Objective:  Physical Exam Constitutional:      Appearance: She is well-developed.  HENT:     Head: Normocephalic and atraumatic.  Cardiovascular:     Rate and Rhythm: Normal rate and regular rhythm.  Pulmonary:     Effort: Pulmonary effort is normal. No respiratory distress.     Breath sounds: Normal breath sounds. No wheezing or rales.  Abdominal:     General: Bowel sounds are normal. There is no distension.     Palpations: Abdomen is soft.     Tenderness: There is no abdominal tenderness. There is no rebound.  Musculoskeletal:        General: Tenderness present.     Cervical back: Normal range of motion.     Comments: Flank tenderness bilateral  Skin:    General: Skin is warm and dry.    Neurological:     Mental Status: She is alert and oriented to person, place, and time.     Coordination: Coordination normal.     Vitals:   05/19/20 1401  BP: 126/76  Pulse: 76  Temp: 98.1 F (36.7 C)  TempSrc: Oral  SpO2: 96%  Weight: 242 lb (109.8 kg)  Height: 5\' 5"  (1.651 m)    This visit occurred during the SARS-CoV-2 public health emergency.  Safety protocols were in place, including screening questions prior to the visit, additional usage of staff PPE, and extensive cleaning of exam room while observing appropriate contact time as indicated for disinfecting solutions.   Assessment & Plan:

## 2020-05-19 NOTE — Telephone Encounter (Signed)
Pt c/o medication issue:  1. Name of Medication:  Lasix  2. How are you currently taking this medication (dosage and times per day)?  3 times a week  3. Are you having a reaction (difficulty breathing--STAT)?no  4. What is your medication issue? Side is hurting so bad, think it might be effecting her kidney

## 2020-05-19 NOTE — Telephone Encounter (Signed)
Agree  Pain does not sound cardiac  Agree with taper of diuretic   2x per week for 1 wk ;   1x per week then stop

## 2020-05-20 ENCOUNTER — Encounter: Payer: Self-pay | Admitting: Internal Medicine

## 2020-05-20 ENCOUNTER — Ambulatory Visit
Admission: RE | Admit: 2020-05-20 | Discharge: 2020-05-20 | Disposition: A | Discharge status: Home / Self Care | Payer: Medicare Other | Source: Ambulatory Visit | Attending: Internal Medicine | Admitting: Internal Medicine

## 2020-05-20 DIAGNOSIS — I700 Atherosclerosis of aorta: Secondary | ICD-10-CM | POA: Diagnosis not present

## 2020-05-20 DIAGNOSIS — K429 Umbilical hernia without obstruction or gangrene: Secondary | ICD-10-CM | POA: Diagnosis not present

## 2020-05-20 DIAGNOSIS — R109 Unspecified abdominal pain: Secondary | ICD-10-CM

## 2020-05-20 DIAGNOSIS — N200 Calculus of kidney: Secondary | ICD-10-CM | POA: Diagnosis not present

## 2020-05-20 DIAGNOSIS — K7689 Other specified diseases of liver: Secondary | ICD-10-CM | POA: Diagnosis not present

## 2020-05-20 LAB — CBC
HCT: 40.3 % (ref 35.0–45.0)
Hemoglobin: 13.8 g/dL (ref 11.7–15.5)
MCH: 34.2 pg — ABNORMAL HIGH (ref 27.0–33.0)
MCHC: 34.2 g/dL (ref 32.0–36.0)
MCV: 99.8 fL (ref 80.0–100.0)
MPV: 11.1 fL (ref 7.5–12.5)
Platelets: 174 10*3/uL (ref 140–400)
RBC: 4.04 10*6/uL (ref 3.80–5.10)
RDW: 11.9 % (ref 11.0–15.0)
WBC: 6.3 10*3/uL (ref 3.8–10.8)

## 2020-05-20 LAB — COMPREHENSIVE METABOLIC PANEL
AG Ratio: 1.7 (calc) (ref 1.0–2.5)
ALT: 19 U/L (ref 6–29)
AST: 23 U/L (ref 10–35)
Albumin: 4.7 g/dL (ref 3.6–5.1)
Alkaline phosphatase (APISO): 76 U/L (ref 37–153)
BUN: 14 mg/dL (ref 7–25)
CO2: 23 mmol/L (ref 20–32)
Calcium: 9.3 mg/dL (ref 8.6–10.4)
Chloride: 96 mmol/L — ABNORMAL LOW (ref 98–110)
Creat: 0.73 mg/dL (ref 0.60–0.93)
Globulin: 2.7 g/dL (calc) (ref 1.9–3.7)
Glucose, Bld: 100 mg/dL — ABNORMAL HIGH (ref 65–99)
Potassium: 3.9 mmol/L (ref 3.5–5.3)
Sodium: 129 mmol/L — ABNORMAL LOW (ref 135–146)
Total Bilirubin: 0.7 mg/dL (ref 0.2–1.2)
Total Protein: 7.4 g/dL (ref 6.1–8.1)

## 2020-05-20 LAB — LIPASE: Lipase: 48 U/L (ref 7–60)

## 2020-05-20 NOTE — Assessment & Plan Note (Signed)
U/A checked in office without signs of infection or blood. Checking CBC, CMP, lipase as well as CT to rule out renal stone.

## 2020-05-23 ENCOUNTER — Encounter: Payer: Self-pay | Admitting: Podiatry

## 2020-05-23 ENCOUNTER — Other Ambulatory Visit: Payer: Self-pay | Admitting: Podiatry

## 2020-05-23 ENCOUNTER — Ambulatory Visit (INDEPENDENT_AMBULATORY_CARE_PROVIDER_SITE_OTHER): Payer: Medicare Other

## 2020-05-23 ENCOUNTER — Other Ambulatory Visit: Payer: Self-pay

## 2020-05-23 ENCOUNTER — Ambulatory Visit: Payer: Medicare Other | Admitting: Podiatry

## 2020-05-23 ENCOUNTER — Ambulatory Visit (INDEPENDENT_AMBULATORY_CARE_PROVIDER_SITE_OTHER): Payer: Medicare Other | Admitting: Podiatry

## 2020-05-23 VITALS — Temp 97.2°F

## 2020-05-23 DIAGNOSIS — M25571 Pain in right ankle and joints of right foot: Secondary | ICD-10-CM

## 2020-05-23 DIAGNOSIS — M779 Enthesopathy, unspecified: Secondary | ICD-10-CM

## 2020-05-23 DIAGNOSIS — M25572 Pain in left ankle and joints of left foot: Secondary | ICD-10-CM | POA: Diagnosis not present

## 2020-05-24 DIAGNOSIS — M62838 Other muscle spasm: Secondary | ICD-10-CM | POA: Diagnosis not present

## 2020-05-24 DIAGNOSIS — M0609 Rheumatoid arthritis without rheumatoid factor, multiple sites: Secondary | ICD-10-CM | POA: Diagnosis not present

## 2020-05-24 DIAGNOSIS — Z79899 Other long term (current) drug therapy: Secondary | ICD-10-CM | POA: Diagnosis not present

## 2020-05-24 DIAGNOSIS — M2550 Pain in unspecified joint: Secondary | ICD-10-CM | POA: Diagnosis not present

## 2020-05-24 DIAGNOSIS — M150 Primary generalized (osteo)arthritis: Secondary | ICD-10-CM | POA: Diagnosis not present

## 2020-05-24 DIAGNOSIS — E669 Obesity, unspecified: Secondary | ICD-10-CM | POA: Diagnosis not present

## 2020-05-24 DIAGNOSIS — M546 Pain in thoracic spine: Secondary | ICD-10-CM | POA: Diagnosis not present

## 2020-05-24 DIAGNOSIS — Z6838 Body mass index (BMI) 38.0-38.9, adult: Secondary | ICD-10-CM | POA: Diagnosis not present

## 2020-05-25 ENCOUNTER — Other Ambulatory Visit: Payer: Self-pay | Admitting: Obstetrics & Gynecology

## 2020-05-25 DIAGNOSIS — Z1231 Encounter for screening mammogram for malignant neoplasm of breast: Secondary | ICD-10-CM

## 2020-05-26 ENCOUNTER — Ambulatory Visit (INDEPENDENT_AMBULATORY_CARE_PROVIDER_SITE_OTHER): Payer: Medicare Other | Admitting: Internal Medicine

## 2020-05-26 ENCOUNTER — Encounter: Payer: Self-pay | Admitting: Internal Medicine

## 2020-05-26 ENCOUNTER — Other Ambulatory Visit: Payer: Self-pay

## 2020-05-26 DIAGNOSIS — I4819 Other persistent atrial fibrillation: Secondary | ICD-10-CM | POA: Diagnosis not present

## 2020-05-26 DIAGNOSIS — E785 Hyperlipidemia, unspecified: Secondary | ICD-10-CM

## 2020-05-26 DIAGNOSIS — R4586 Emotional lability: Secondary | ICD-10-CM | POA: Diagnosis not present

## 2020-05-26 DIAGNOSIS — I10 Essential (primary) hypertension: Secondary | ICD-10-CM | POA: Diagnosis not present

## 2020-05-26 MED ORDER — ATENOLOL 50 MG PO TABS: 50.0000 mg | ORAL_TABLET | Freq: Every day | ORAL | 3 refills | Status: DC

## 2020-05-26 MED ORDER — METHYLPREDNISOLONE 4 MG PO TBPK
ORAL_TABLET | ORAL | 0 refills | Status: DC
Start: 2020-05-26 — End: 2020-06-02

## 2020-05-26 MED ORDER — SERTRALINE HCL 50 MG PO TABS: 50.0000 mg | ORAL_TABLET | Freq: Every day | ORAL | 3 refills | Status: DC

## 2020-05-26 NOTE — Progress Notes (Signed)
Subjective:  Patient ID: Amanda Wilkins, female    DOB: 02-Feb-1946  Age: 74 y.o. MRN: [id number]  CC: No chief complaint on file.   HPI Amanda Wilkins presents for L ankle pain - a shot helped for 1 day F/u anxiety, A fib  Outpatient Medications Prior to Visit  Medication Sig Dispense Refill  . acetaminophen (TYLENOL) 500 MG tablet Take 1,000 mg by mouth every 8 (eight) hours as needed (pain).    [name] atenolol (TENORMIN) 50 MG tablet TAKE 1 TABLET BY MOUTH DAILY 30 tablet 2  . cholecalciferol (VITAMIN D3) 25 MCG (1000 UT) tablet Take 1,000 Units by mouth daily.    . furosemide (LASIX) 20 MG tablet Take 1 tablet by mouth twice a week x 2 week, then weekly x 1 week then stop 30 tablet 3  . hydroxychloroquine (PLAQUENIL) 200 MG tablet Take 200 mg by mouth every morning.     . Multiple Vitamin (MULTIVITAMIN) capsule Take 1 capsule by mouth daily.    . pantoprazole (PROTONIX) 40 MG tablet Take 1 tablet (40 mg total) by mouth daily. 30 tablet 2  . potassium chloride (KLOR-CON) 10 MEQ tablet Take 1 tablet by mouth on Mondays Wednesdays Fridays 40 tablet 3  . rosuvastatin (CRESTOR) 5 MG tablet TAKE 1 TABLET(5 MG) BY MOUTH DAILY 90 tablet 1  . sertraline (ZOLOFT) 50 MG tablet TAKE 1 AND 1/2 TABLET BY MOUTH DAILY 135 tablet 3  . XARELTO 20 MG TABS tablet TAKE 1 TABLET BY MOUTH DAILY WITH SUPPER 90 tablet 3   No facility-administered medications prior to visit.    ROS: Review of Systems  Constitutional: Negative for activity change, appetite change, chills, fatigue and unexpected weight change.  HENT: Negative for congestion, mouth sores and sinus pressure.   Eyes: Negative for visual disturbance.  Respiratory: Negative for cough and chest tightness.   Gastrointestinal: Negative for abdominal pain and nausea.  Genitourinary: Negative for difficulty urinating, frequency and vaginal pain.  Musculoskeletal: Positive for arthralgias. Negative for back pain and gait problem.  Skin:  Negative for pallor and rash.  Neurological: Negative for dizziness, tremors, weakness, numbness and headaches.  Psychiatric/Behavioral: Negative for confusion and sleep disturbance. The patient is nervous/anxious.     Objective:  BP 132/60 (BP Location: Right Arm, Patient Position: Sitting, Cuff Size: Large)   Pulse 65   Temp 98 F (36.7 C) (Oral)   Ht 5\' 5"  (1.651 m)   Wt 235 lb (106.6 kg)   SpO2 98%   BMI 39.11 kg/m   BP Readings from Last 3 Encounters:  05/26/20 132/60  05/19/20 126/76  05/05/20 122/80    Wt Readings from Last 3 Encounters:  05/26/20 235 lb (106.6 kg)  05/19/20 242 lb (109.8 kg)  05/05/20 (!) 238 lb 3.2 oz (108 kg)    Physical Exam Constitutional:      General: She is not in acute distress.    Appearance: She is well-developed. She is obese.  HENT:     Head: Normocephalic.     Right Ear: External ear normal.     Left Ear: External ear normal.     Nose: Nose normal.  Eyes:     General:        Right eye: No discharge.        Left eye: No discharge.     Conjunctiva/sclera: Conjunctivae normal.     Pupils: Pupils are equal, round, and reactive to light.  Neck:     Thyroid: No thyromegaly.  Vascular: No JVD.     Trachea: No tracheal deviation.  Cardiovascular:     Rate and Rhythm: Normal rate and regular rhythm.     Heart sounds: Normal heart sounds.  Pulmonary:     Effort: No respiratory distress.     Breath sounds: No stridor. No wheezing.  Abdominal:     General: Bowel sounds are normal. There is no distension.     Palpations: Abdomen is soft. There is no mass.     Tenderness: There is no abdominal tenderness. There is no guarding or rebound.  Musculoskeletal:        General: No tenderness.     Cervical back: Normal range of motion and neck supple.  Lymphadenopathy:     Cervical: No cervical adenopathy.  Skin:    Findings: No erythema or rash.  Neurological:     Mental Status: She is oriented to person, place, and time.      Cranial Nerves: No cranial nerve deficit.     Motor: No abnormal muscle tone.     Coordination: Coordination normal.     Deep Tendon Reflexes: Reflexes normal.  Psychiatric:        Behavior: Behavior normal.        Thought Content: Thought content normal.        Judgment: Judgment normal.   L dorsal ankle - painful to palp laterally  Lab Results  Component Value Date   WBC 6.3 05/19/2020   HGB 13.8 05/19/2020   HCT 40.3 05/19/2020   PLT 174 05/19/2020   GLUCOSE 100 (H) 05/19/2020   CHOL 167 07/03/2019   TRIG 72 07/03/2019   HDL 95 07/03/2019   LDLCALC 58 07/03/2019   ALT 19 05/19/2020   AST 23 05/19/2020   NA 129 (L) 05/19/2020   K 3.9 05/19/2020   CL 96 (L) 05/19/2020   CREATININE 0.73 05/19/2020   BUN 14 05/19/2020   CO2 23 05/19/2020   TSH 3.16 05/28/2018   INR 1.6 03/05/2016   HGBA1C 5.8 (H) 01/30/2015    CT RENAL STONE STUDY  Result Date: 05/20/2020 CLINICAL DATA:  Flank pain bilaterally EXAM: CT ABDOMEN AND PELVIS WITHOUT CONTRAST TECHNIQUE: Multidetector CT imaging of the abdomen and pelvis was performed following the standard protocol without oral or IV contrast. COMPARISON:  January 24, 2015 FINDINGS: Lower chest: There is bibasilar atelectasis. There is no lung base edema or consolidation. Coronary artery calcification noted. Hepatobiliary: There is a 5 mm apparent cyst along the anterior aspect of the left lobe of the liver. No other focal liver lesions are appreciable. The gallbladder wall is not appreciably thickened. There is no biliary duct dilatation. Pancreas: There is no pancreatic mass or inflammatory focus. Spleen: No splenic lesions are evident. Accessory spleens are noted medially, a stable finding. Adrenals/Urinary Tract: Adrenals bilaterally appear normal. There is no appreciable renal mass or hydronephrosis on either side. There is a 3 x 2 mm calculus in the posterior right mid kidney. There is no appreciable ureterectasis on either side. Urinary bladder is  midline with wall thickness within normal limits. Stomach/Bowel: There is no appreciable bowel wall or mesenteric thickening. Terminal ileum appears normal. There is no evident bowel obstruction. There is no free air or portal venous air. Vascular/Lymphatic: No abdominal aortic aneurysm. There is aortic atherosclerosis. No adenopathy is appreciable in the abdomen or pelvis. Reproductive: The uterus is anteverted. There is no evident pelvic mass. Other: Appendix appears normal. No evident abscess or ascites in the abdomen or  pelvis. There is a small umbilical hernia containing only fat. Musculoskeletal: Degenerative changes noted in the lower thoracic and lumbar regions. There is also degenerative change in each hip joint. No blastic or lytic bone lesions are evident. There is no intramuscular lesion. IMPRESSION: 1. There is a 3 x 2 mm calculus in the posterior mid right kidney. No hydronephrosis or ureteral calculus on either side. Urinary bladder wall thickness is normal. 2. No evident bowel obstruction. No abscess in the abdomen or pelvis. Appendix appears normal. 3. Aortic Atherosclerosis (ICD10-I70.0). Foci of coronary artery calcification also noted. 4.  Small umbilical hernia containing fat. Electronically Signed   By: Lowella Grip III M.D.   On: 05/20/2020 11:05    Assessment & Plan:    Follow-up: No follow-ups on file.  [name], MD

## 2020-05-26 NOTE — Patient Instructions (Signed)
Voltaren gel 

## 2020-05-29 ENCOUNTER — Encounter: Payer: Self-pay | Admitting: Internal Medicine

## 2020-05-29 NOTE — Assessment & Plan Note (Signed)
Zoloft

## 2020-05-29 NOTE — Assessment & Plan Note (Signed)
BP Readings from Last 3 Encounters:  05/26/20 132/60  05/19/20 126/76  05/05/20 122/80

## 2020-05-29 NOTE — Assessment & Plan Note (Signed)
Xarelto, Atenolol

## 2020-05-29 NOTE — Assessment & Plan Note (Signed)
On Crestor 

## 2020-05-31 ENCOUNTER — Other Ambulatory Visit: Payer: Self-pay

## 2020-05-31 ENCOUNTER — Telehealth: Payer: Self-pay | Admitting: Internal Medicine

## 2020-05-31 ENCOUNTER — Ambulatory Visit (HOSPITAL_COMMUNITY): Payer: Medicare Other | Attending: Internal Medicine

## 2020-05-31 DIAGNOSIS — R0609 Other forms of dyspnea: Secondary | ICD-10-CM | POA: Diagnosis not present

## 2020-05-31 DIAGNOSIS — R0602 Shortness of breath: Secondary | ICD-10-CM | POA: Insufficient documentation

## 2020-05-31 DIAGNOSIS — R0600 Dyspnea, unspecified: Secondary | ICD-10-CM | POA: Diagnosis not present

## 2020-05-31 NOTE — Telephone Encounter (Signed)
  Patient c/o Palpitations:  High priority if patient c/o lightheadedness, shortness of breath, or chest pain  1) How long have you had palpitations/irregular HR/ Afib? Are you having the symptoms now? Started yesterday but calmed down. Started back up this afternoon after her pulmonary stress test   2) Are you currently experiencing lightheadedness, SOB or CP? no  3) Do you have a history of afib (atrial fibrillation) or irregular heart rhythm? Yes, afib  4) Have you checked your BP or HR? (document readings if available): 147/70 (pt is not 448% certain, that is a rough estimate)  5) Are you experiencing any other symptoms? Irregular HR causes the patient to burp

## 2020-05-31 NOTE — Telephone Encounter (Signed)
Pt wrote in earlier today    Wants to be seen for palpitaitons  Can she be squeezed into clinic on Thursday at 10:20?

## 2020-05-31 NOTE — Telephone Encounter (Signed)
I spoke with patient. She reports palpitations which started yesterday. Describes as feeling uncomfortable.  Not painful. Heart rate is 70. Palpitations cause her to burp.  No dizziness or other symptoms.  Patient took extra atenolol about 10 minutes ago.  Patient with history of PAF.  She is taking Xarelto.  I told patient I would make Dr [name] aware and advised her to keep appointment with Dr [name] on September 3,2021.   Patient requests to be seen in office this week.

## 2020-06-01 NOTE — Telephone Encounter (Signed)
The patient has been scheduled tomorrow with Dr. [name].

## 2020-06-01 NOTE — Telephone Encounter (Signed)
Fay Records, MD at 05/31/2020 9:16 PM  Status: Signed    Pt wrote in earlier today    Wants to be seen for palpitaitons  Can she be squeezed into clinic on Thursday at 10:20?     Message to scheduling to try to contact patient and see if she can come in tomorrow am 10:20 w Dr. [name].

## 2020-06-02 ENCOUNTER — Ambulatory Visit (INDEPENDENT_AMBULATORY_CARE_PROVIDER_SITE_OTHER): Payer: Medicare Other | Admitting: Internal Medicine

## 2020-06-02 ENCOUNTER — Encounter: Payer: Self-pay | Admitting: Internal Medicine

## 2020-06-02 ENCOUNTER — Other Ambulatory Visit: Payer: Self-pay

## 2020-06-02 VITALS — BP 112/60 | HR 67 | Ht 65.0 in | Wt 235.2 lb

## 2020-06-02 DIAGNOSIS — R0600 Dyspnea, unspecified: Secondary | ICD-10-CM

## 2020-06-02 DIAGNOSIS — R0609 Other forms of dyspnea: Secondary | ICD-10-CM

## 2020-06-02 LAB — BASIC METABOLIC PANEL
BUN/Creatinine Ratio: 23 (ref 12–28)
BUN: 19 mg/dL (ref 8–27)
CO2: 23 mmol/L (ref 20–29)
Calcium: 9.5 mg/dL (ref 8.7–10.3)
Chloride: 94 mmol/L — ABNORMAL LOW (ref 96–106)
Creatinine, Ser: 0.83 mg/dL (ref 0.57–1.00)
GFR calc Af Amer: 81 mL/min/{1.73_m2} (ref >59)
GFR calc non Af Amer: 70 mL/min/{1.73_m2} (ref >59)
Glucose: 82 mg/dL (ref 65–99)
Potassium: 4.6 mmol/L (ref 3.5–5.2)
Sodium: 130 mmol/L — ABNORMAL LOW (ref 134–144)

## 2020-06-02 LAB — MAGNESIUM: Magnesium: 2.2 mg/dL (ref 1.6–2.3)

## 2020-06-02 NOTE — Patient Instructions (Signed)
Medication Instructions:  No changes *If you need a refill on your cardiac medications before your next appointment, please call your pharmacy*   Lab Work: Today: bmet, mag  If you have labs (blood work) drawn today and your tests are completely normal, you will receive your results only by: [name] MyChart Message (if you have MyChart) OR . A paper copy in the mail If you have any lab test that is abnormal or we need to change your treatment, we will call you to review the results.   Testing/Procedures: none   Follow-Up: At [facility], you and your health needs are our priority.  As part of our continuing mission to provide you with exceptional heart care, we have created designated Provider Care Teams.  These Care Teams include your primary Cardiologist (physician) and Advanced Practice Providers (APPs -  Physician Assistants and Nurse Practitioners) who all work together to provide you with the care you need, when you need it.  Your next appointment:   6 month(s)  The format for your next appointment:   In Person  Provider:   You may see [name], MD or one of the following Advanced Practice Providers on your designated Care Team:    Richardson Dopp, PA-C  Robbie Lis, [place]    Other Instructions

## 2020-06-02 NOTE — Progress Notes (Signed)
Cardiology Office Note   Date:  06/02/2020   ID:  Haset O[place]aca, DOB [birth year], MRN [id number]  PCP:  [name], MD  Cardiologist:   [name], MD    F/u of dyspnea     History of Present Illness: Amanda Wilkins is a 74 y.o. female with a history of chest pain, near syncope  and PAF  Echo in 2016 showed normal LVEF   Myovue showed inferolateral and inferior defect consistent with ischemia   [facility] also  has had near syncope in past    Carotid USN showed mild plaquing   Holter showed intermitt afib/SVT    Flecanide recommended but pt did not start    In Feb 201o she had atrial flutter   REverted to SR on own  After that she had intermitt heart pounding   Recommendation was to take an extra metoprolol as needed    In 2020 a calcium score was 0  PFTs done for dyspnea  They were normal      She sawB Bhagat in Feb 2021  She continued to note DOE esp with hills  Not with riding bike  BMET and BNP done  BNP was a little elevated at 433  Lasix ws recommended 3x per wk    A repeat echo ordered   This showed normal LVEF and RVEF   There was mild MR noted   RVSP estimated at 47 mm Hg    The pt still complains of dyspnea with walking    She just had a CPX done that did not show signif abnormality (full report fpending)  She bikes on a stationary bike   Still gets SOBwith walking outside   Would like to go through a pulmonary rehab so she can walk again     Current Meds  Medication Sig  . acetaminophen (TYLENOL) 500 MG tablet Take 1,000 mg by mouth every 8 (eight) hours as needed (pain).  [name] atenolol (TENORMIN) 50 MG tablet Take 1 tablet (50 mg total) by mouth daily.  . cholecalciferol (VITAMIN D3) 25 MCG (1000 UT) tablet Take 1,000 Units by mouth daily.  . hydroxychloroquine (PLAQUENIL) 200 MG tablet Take 200 mg by mouth every morning.   . Multiple Vitamin (MULTIVITAMIN) capsule Take 1 capsule by mouth daily.  . pantoprazole (PROTONIX) 40 MG tablet Take 1 tablet (40 mg  total) by mouth daily. (Patient taking differently: Take 40 mg by mouth daily as needed (Acid Reflux). )  . rosuvastatin (CRESTOR) 5 MG tablet TAKE 1 TABLET(5 MG) BY MOUTH DAILY  . sertraline (ZOLOFT) 50 MG tablet Take 1 tablet (50 mg total) by mouth daily.  Alveda Reasons 20 MG TABS tablet TAKE 1 TABLET BY MOUTH DAILY WITH SUPPER     Allergies:   Epinephrine base, Aspirin, Penicillins, Synvisc [hylan g-f 20], and Tape   Past Medical History:  Diagnosis Date  . Anticoagulant long-term use    Xarelto  . Chronic interstitial nephritis   . Depression   . History of interstitial nephritis 2016   chronic  . History of nuclear stress test 12/16/2014   Intermediate risk nuclear study w/ medium size moderate severity reversible defect in the basal and mid inferolateral and inferior wall (per dr cardiology note , dr [name] did not think this was consistent with ischemia)/  normal LV function and wall motion, ef 75%  . History of septic shock 01/22/2015   in setting Group A Strep Cellulits erysipelas/chest wall induration with streptoccocus basterium-- Severe  sepsis, DIC, Acute respiratory failure with pulmonary edema, Acute Kidney failure with chronic interstitial nephritis  . Hypertension   . Hyponatremia   . Migraines    on Zoloft for migraines  . Mild carotid artery disease ([place])    per duplex 08-30-2017 bilateral ICA 1-39%  . OA (osteoarthritis) rheumotologist-  dr [name]   both knees,  shoulders, ankles  . OSA on CPAP     moderate obstructive sleep apnea with an AHI of 23.4/h and oxygen desaturations as low as 84%.  Now on CPAP at 12 cm H2O.  [name] PAF (paroxysmal atrial fibrillation) [facility]) 2009   cardiologist-- dr [name]  . Paroxysmal atrial flutter ([place])    a. dx 11/2017.  [name] PSVT (paroxysmal supraventricular tachycardia) ([place])   . Wears contact lenses     Past Surgical History:  Procedure Laterality Date  . CESAREAN SECTION  1980  . ESOPHAGOGASTRODUODENOSCOPY N/A  02/08/2015   Procedure: ESOPHAGOGASTRODUODENOSCOPY (EGD);  Surgeon: [name], MD;  Location: [place];  Service: Endoscopy;  Laterality: N/A;  . KNEE ARTHROSCOPY W/ MENISCAL REPAIR Left 08/2014    @[facility]   . SHOULDER SURGERY Right 04/04/2016  . TOTAL KNEE ARTHROPLASTY Right 09/01/2018   Procedure: RIGHT TOTAL KNEE ARTHROPLASTY;  Surgeon: [name], MD;  Location: [facility];  Service: Orthopedics;  Laterality: Right;     Social History:  The patient  reports that she has never smoked. She has never used smokeless tobacco. She reports current alcohol use. She reports that she does not use drugs.   Family History:  The patient's family history includes Breast cancer (age of onset: 63) in her sister; Lymphoma in her mother; Pancreatic cancer in her brother; Prostate cancer in her father.    ROS:  Please see the history of present illness. All other systems are reviewed and  Negative to the above problem except as noted.    PHYSICAL EXAM: VS:  BP 112/60   Pulse 67   Ht 5\' 5"  (1.651 m)   Wt 235 lb 3.2 oz (106.7 kg)   SpO2 98%   BMI 39.14 kg/m   GEN: Obese 74 yo  in no acute distress  HEENT: normal  Neck: JVP is normal  No carotid bruits Cardiac: RRR; no murmurs, rubs, or gallops,no LE edema  Respiratory:  clear to auscultation bilaterally, normal work of breathing GI: soft, nontender, nondistended, + BS  No hepatomegaly  MS: no deformity Moving all extremities   Skin: warm and dry, no rash Neuro:  Grossly intact   Psych: euthymic mood, full affect   EKG:  EKG is not  ordered today.   Lipid Panel    Component Value Date/Time   CHOL 167 07/03/2019 1458   TRIG 72 07/03/2019 1458   HDL 95 07/03/2019 1458   CHOLHDL 1.8 07/03/2019 1458   CHOLHDL 2 05/28/2018 0837   VLDL 13.8 05/28/2018 0837   LDLCALC 58 07/03/2019 1458      Wt Readings from Last 3 Encounters:  06/02/20 235 lb 3.2 oz (106.7 kg)  05/26/20 235 lb (106.6 kg)  05/19/20 242 lb (109.8 kg)     Echo:   1. Left ventricular ejection fraction, by estimation, is 55 to 60%. The left ventricle has normal function. The left ventricle has no regional wall motion abnormalities. There is mild concentric left ventricular hypertrophy. Left ventricular diastolic parameters were normal. 2. Right ventricular systolic function is normal. The right ventricular size is normal. There is moderately elevated pulmonary artery systolic pressure.  3. The mitral valve is normal in structure and function. Mild mitral valve regurgitation. No evidence of mitral stenosis. 4. The aortic valve is tricuspid. Aortic valve regurgitation is not visualized. No aortic stenosis is present. 5. The inferior vena cava is normal in size with <50% respiratory variability, suggesting right atrial pressure of 8 mmHg. Comparison(s): 01/25/15 EF 55-60%. PA pressure 40mmHg.  ASSESSMENT AND PLAN:  1 Dyspnea.  Still SOB with walking   Will look into a pulmonary rehab   Pt is very interested  2   PAF  Clinically in SR   Rare palpitations   Continue current regimen with Xarelto  2 Hx diastolic dysfunction. Volume status appears OK   Will check BMET   3 Hx CP     Denies any at present   Ca score is 0   Pt has mild plaquing of aorta  Continue Crestor   2  HTN  Continue to follow BP   3  CV dz Mild plaquing    4  HL  Keep on Crestor given atherosclerosis of aorta    5  MR   Mild on echo   F/U in 6 months       Current medicines are reviewed at length with the patient today.  The patient does not have concerns regarding medicines.  Signed, [name], MD  06/02/2020 10:39 PM    [address], [place], [place]  [address] Phone: [phone]; Fax: [phone]

## 2020-06-06 NOTE — Progress Notes (Signed)
Staff message to C. Wilber Oliphant, RN

## 2020-06-08 ENCOUNTER — Ambulatory Visit: Payer: Medicare Other | Admitting: Podiatry

## 2020-06-09 ENCOUNTER — Telehealth: Payer: Self-pay | Admitting: Internal Medicine

## 2020-06-09 ENCOUNTER — Telehealth: Payer: Self-pay

## 2020-06-09 DIAGNOSIS — E876 Hypokalemia: Secondary | ICD-10-CM | POA: Diagnosis not present

## 2020-06-09 DIAGNOSIS — R635 Abnormal weight gain: Secondary | ICD-10-CM | POA: Diagnosis not present

## 2020-06-09 DIAGNOSIS — R7301 Impaired fasting glucose: Secondary | ICD-10-CM | POA: Diagnosis not present

## 2020-06-09 DIAGNOSIS — R0609 Other forms of dyspnea: Secondary | ICD-10-CM

## 2020-06-09 NOTE — Telephone Encounter (Signed)
° ° ° °  Pt would like to speak with Michalene, she said she saw her result of her stress test on her mychart but would like to get more info and explain more to her

## 2020-06-09 NOTE — Telephone Encounter (Signed)
Call from patient earlier today reference PREP program-Dr [name] referred her.  Explained program to her and she is interested. Next class will be in Sept. Will call her back to schedule intake appt.

## 2020-06-09 NOTE — Telephone Encounter (Signed)
Back to Top    Final for report came. Agreed by DB that would be good to get pt in structured exercise/wt loss program  PREP program would be an option      Amanda Records, MD  06/07/2020 3:40 PM EDT     Spoke to [place] program for patient  Would be $100  She can help with info if needed  _____________________________________________________________________________   SPOKE WITH Amanda Wilkins.  SHE IS VERY INTERESTED IN GETTING STARTED IN PREP PROGRAM.  AWARE THAT REFERRAL HAS BEEN PLACED AND THEY WILL BE CONTACTING HER.

## 2020-06-10 ENCOUNTER — Telehealth: Payer: Self-pay

## 2020-06-10 NOTE — Telephone Encounter (Signed)
Call placed to patient to notify of the next start date for PREP is 06/28/20 at [facility] 1p-215pm every T/TH. Confirmed able to do that date and schedule.  Will call back later to arrange intake appt prior to start of class.

## 2020-06-12 DIAGNOSIS — Z91018 Allergy to other foods: Secondary | ICD-10-CM | POA: Diagnosis not present

## 2020-06-12 DIAGNOSIS — L509 Urticaria, unspecified: Secondary | ICD-10-CM | POA: Diagnosis not present

## 2020-06-13 ENCOUNTER — Telehealth: Payer: Self-pay | Admitting: Internal Medicine

## 2020-06-13 NOTE — Telephone Encounter (Signed)
FYI - TEAM HEALTH REPORT/CALL : ---Caller states that she started getting itchy all night. This morning she noticed itchiness to wrist and it spread to arm. There are welts. Throughout the day it started spreading to now it is on abdomen, arms, breasts, left leg, and neck. No facial or oral swelling. Yesterday she bought an Statistician at the grocery store and thinks maybe that was it. No known allergies. Switched to [employer] shakes about 3-4 days ago also.  Advised see PCP within 3 days.  Appointment has been made for 8/31.
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
## 2020-06-14 ENCOUNTER — Ambulatory Visit: Payer: Medicare Other | Admitting: Internal Medicine

## 2020-06-14 ENCOUNTER — Ambulatory Visit (INDEPENDENT_AMBULATORY_CARE_PROVIDER_SITE_OTHER): Payer: Medicare Other | Admitting: Internal Medicine

## 2020-06-14 ENCOUNTER — Other Ambulatory Visit: Payer: Self-pay

## 2020-06-14 ENCOUNTER — Encounter: Payer: Self-pay | Admitting: Internal Medicine

## 2020-06-14 VITALS — BP 142/78 | HR 65 | Temp 98.5°F | Wt 237.0 lb

## 2020-06-14 DIAGNOSIS — L509 Urticaria, unspecified: Secondary | ICD-10-CM | POA: Insufficient documentation

## 2020-06-14 MED ORDER — PREDNISONE 10 MG PO TABS: ORAL_TABLET | ORAL | Status: DC

## 2020-06-14 NOTE — Patient Instructions (Addendum)
Continue the pepcid daily   Increase prednisone to 20 mg for 3 days then 10 mg for 3 days then 5 mg daily for 3-5 days.     A referral was ordered for Allergy.    Someone from their office will call you to schedule an appointment.

## 2020-06-14 NOTE — Assessment & Plan Note (Signed)
Acute Secondary to imitation crab Went to urgent care and is on prednisone and Pepcid, but still having symptoms Likely needs a higher dose of prednisone-increase to 20 mg for 3 days, 10 mg for 3 days and then 5 mg Continue Pepcid Would like better clarification on what she is allergic to-Will refer to allergy for further testing Advised her to let me know if her symptoms do not continue to improve and resolve with current treatment regimen

## 2020-06-14 NOTE — Telephone Encounter (Signed)
Noted  

## 2020-06-15 ENCOUNTER — Encounter: Payer: Self-pay | Admitting: Internal Medicine

## 2020-06-15 MED ORDER — PREDNISONE 10 MG PO TABS: ORAL_TABLET | ORAL | 0 refills | Status: DC

## 2020-06-17 ENCOUNTER — Ambulatory Visit: Payer: Medicare Other | Admitting: Internal Medicine
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
## 2020-06-22 ENCOUNTER — Ambulatory Visit (INDEPENDENT_AMBULATORY_CARE_PROVIDER_SITE_OTHER): Payer: Medicare Other | Admitting: Internal Medicine

## 2020-06-22 ENCOUNTER — Other Ambulatory Visit: Payer: Self-pay

## 2020-06-22 ENCOUNTER — Encounter: Payer: Self-pay | Admitting: Internal Medicine

## 2020-06-22 VITALS — BP 138/80 | HR 71 | Temp 97.9°F | Wt 235.2 lb

## 2020-06-22 DIAGNOSIS — R1011 Right upper quadrant pain: Secondary | ICD-10-CM

## 2020-06-22 NOTE — Assessment & Plan Note (Signed)
Acute for the past few days Tender in right upper quadrant without rebound or guarding Upper abdominal discomfort associated with increased belching, gas, bloating and GERD She just recently finished prednisone taper for urticaria Likely flare of GERD/mild gastritis She does have some tenderness in the right upper quadrant and has had gallbladder issues in the past so need to rule out gallbladder disease Continue pantoprazole Right upper quadrant abdominal ultrasound [place] diet

## 2020-06-22 NOTE — Patient Instructions (Signed)
Continue the pantoprazole until your symptoms completely resolve, then taper off the medication slowly.   An ultrasound of your liver, gallbladder was ordered.

## 2020-06-23 ENCOUNTER — Ambulatory Visit
Admission: RE | Admit: 2020-06-23 | Discharge: 2020-06-23 | Disposition: A | Discharge status: Home / Self Care | Payer: Medicare Other | Source: Ambulatory Visit | Attending: Internal Medicine | Admitting: Internal Medicine

## 2020-06-23 DIAGNOSIS — R1011 Right upper quadrant pain: Secondary | ICD-10-CM

## 2020-06-23 DIAGNOSIS — K8020 Calculus of gallbladder without cholecystitis without obstruction: Secondary | ICD-10-CM | POA: Diagnosis not present

## 2020-06-24 NOTE — Progress Notes (Signed)
[facility] Report   Patient Details  Name: Amanda Wilkins MRN: [id number] Date of Birth: Jun 15, 1946 Age: 74 y.o. PCP: [name], MD  Vitals:   06/24/20 1348  BP: (!) 148/88  Pulse: 73  SpO2: 96%  Weight: 238 lb (108 kg)  Height: 5\' 5"  (1.651 m)      [facility] Eval - 06/24/20 1300      Referral    Referring Provider Dr [name]    Reason for referral Other   SOB   Program Start Date 06/28/20   T/TH 1p-215p x 12 wks      Measurement   Waist Circumference 45 inches    Hip Circumference 49.5 inches    Body fat --   Machine would not measure      Information for Trainer   Goals Goal weight 200; improve strength cardio    Current Exercise Home workouts online, swimming during summer    Orthopedic Concerns R TKR, L Osteo-bone on bone    Pertinent Medical History AFIB, OSA, HTN, no DM    Medications that affect exercise Beta blocker;Medication causing dizziness/drowsiness      Timed Up and Go (TUGS)   Timed Up and Go Low risk <9 seconds   no falls, rises easy from chair     Mobility and Daily Activities   I find it easy to walk up or down two or more flights of stairs. 2    I have no trouble taking out the trash. 4    I do housework such as vacuuming and dusting on my own without difficulty. 4    I can easily lift a gallon of milk (8lbs). 4    I can easily walk a mile. 1    I have no trouble reaching into high cupboards or reaching down to pick up something from the floor. 2    I do not have trouble doing out-door work such as Armed forces logistics/support/administrative officer, raking leaves, or gardening. 1      Mobility and Daily Activities   I feel younger than my age. 4    I feel independent. 4    I feel energetic. 3    I live an active life.  3    I feel strong. 4    I feel healthy. 3    I feel active as other people my age. 3      How fit and strong are you.   Fit and Strong Total Score 42          Past Medical History:  Diagnosis Date  . Anticoagulant  long-term use    Xarelto  . Chronic interstitial nephritis   . Depression   . History of interstitial nephritis 2016   chronic  . History of nuclear stress test 12/16/2014   Intermediate risk nuclear study w/ medium size moderate severity reversible defect in the basal and mid inferolateral and inferior wall (per dr cardiology note , dr [name] did not think this was consistent with ischemia)/  normal LV function and wall motion, ef 75%  . History of septic shock 01/22/2015   in setting Group A Strep Cellulits erysipelas/chest wall induration with streptoccocus basterium-- Severe sepsis, DIC, Acute respiratory failure with pulmonary edema, Acute Kidney failure with chronic interstitial nephritis  . Hypertension   . Hyponatremia   . Migraines    on Zoloft for migraines  . Mild carotid artery disease ([place])    per duplex 08-30-2017 bilateral ICA  1-39%  . OA (osteoarthritis) rheumotologist-  dr [name]   both knees,  shoulders, ankles  . OSA on CPAP     moderate obstructive sleep apnea with an AHI of 23.4/h and oxygen desaturations as low as 84%.  Now on CPAP at 12 cm H2O.  [name] PAF (paroxysmal atrial fibrillation) [facility]) 2009   cardiologist-- dr [name]  . Paroxysmal atrial flutter ([place])    a. dx 11/2017.  [name] PSVT (paroxysmal supraventricular tachycardia) ([place])   . Wears contact lenses    Past Surgical History:  Procedure Laterality Date  . CESAREAN SECTION  1980  . ESOPHAGOGASTRODUODENOSCOPY N/A 02/08/2015   Procedure: ESOPHAGOGASTRODUODENOSCOPY (EGD);  Surgeon: [name], MD;  Location: [place];  Service: Endoscopy;  Laterality: N/A;  . KNEE ARTHROSCOPY W/ MENISCAL REPAIR Left 08/2014    @[facility]   . SHOULDER SURGERY Right 04/04/2016  . TOTAL KNEE ARTHROPLASTY Right 09/01/2018   Procedure: RIGHT TOTAL KNEE ARTHROPLASTY;  Surgeon: [name], MD;  [facility];  Service: Orthopedics;  Laterality: Right;   Social History   Tobacco Use  Smoking Status Never  Smoker  Smokeless Tobacco Never Used  Tobacco Comment   H/O social smoking at times when drinking--never a "habit" (in the 1970s)        Barnett Hatter 06/24/2020, 1:53 PM

## 2020-06-28 ENCOUNTER — Ambulatory Visit (INDEPENDENT_AMBULATORY_CARE_PROVIDER_SITE_OTHER): Payer: Medicare Other | Admitting: Internal Medicine

## 2020-06-28 ENCOUNTER — Other Ambulatory Visit: Payer: Self-pay

## 2020-06-28 ENCOUNTER — Encounter: Payer: Self-pay | Admitting: Internal Medicine

## 2020-06-28 DIAGNOSIS — I10 Essential (primary) hypertension: Secondary | ICD-10-CM | POA: Diagnosis not present

## 2020-06-28 DIAGNOSIS — E871 Hypo-osmolality and hyponatremia: Secondary | ICD-10-CM | POA: Diagnosis not present

## 2020-06-28 DIAGNOSIS — I4819 Other persistent atrial fibrillation: Secondary | ICD-10-CM

## 2020-06-28 DIAGNOSIS — L509 Urticaria, unspecified: Secondary | ICD-10-CM

## 2020-06-28 MED ORDER — FAMOTIDINE 40 MG PO TABS: 40.0000 mg | ORAL_TABLET | Freq: Every day | ORAL | 3 refills | Status: DC

## 2020-06-28 MED ORDER — CETIRIZINE HCL 10 MG PO TABS
10.0000 mg | ORAL_TABLET | Freq: Every day | ORAL | 3 refills | Status: DC
Start: 2020-06-28 — End: 2020-11-07

## 2020-06-28 MED ORDER — ATENOLOL 50 MG PO TABS: 50.0000 mg | ORAL_TABLET | Freq: Two times a day (BID) | ORAL | 3 refills | Status: DC

## 2020-06-28 MED ORDER — METHYLPREDNISOLONE 4 MG PO TBPK: ORAL_TABLET | ORAL | 0 refills | Status: DC

## 2020-06-28 MED ORDER — SERTRALINE HCL 50 MG PO TABS: 25.0000 mg | ORAL_TABLET | Freq: Every day | ORAL | 3 refills | Status: DC

## 2020-06-28 NOTE — Patient Instructions (Signed)
[place] started vaccine booster sign up. Please call [facility]ine Line at [phone]. You can also call the venue where you had your initial COVID 19 vaccination.   

## 2020-06-28 NOTE — Assessment & Plan Note (Addendum)
Hives after ingesting imitation crab meat Hives after exercising Finished steroids, Re-start  Pepcid Add Zyrtec ?plaquenil or atenolol related - less likely  Medrol Dosepak to use as needed severe skin reaction

## 2020-06-28 NOTE — Assessment & Plan Note (Signed)
Atenolol - increase to BID due to elevated BP

## 2020-06-28 NOTE — Progress Notes (Signed)
Subjective:  Patient ID: Amanda Wilkins, female    DOB: 1945/12/17  Age: 74 y.o. MRN: [id number]  CC: No chief complaint on file.   HPI Amanda Wilkins presents for hyponatremia.  Her sodium was 129.  She has no symptoms.  Dr. [name] asked Amanda Wilkins to reduce her fluid intake to 1.5 L a day.  Amanda Wilkins was drinking 1.7 L a day prior. I was able to track her sodium levels back to 2014.  There were low normal to slightly decreased sodium levels off and on throughout.  Amanda Wilkins is complaining of an episode of hives that she developed in the end of August.  The episode started 4 hours after ingesting imitation crab meat on her salad.  No new meds.  She had to use steroids.  Hives have returned several times.  She was treated with Pepcid and antihistamines as well.  Today they returned after she exercised.  Her blood pressure has remained elevated.  Outpatient Medications Prior to Visit  Medication Sig Dispense Refill  . acetaminophen (TYLENOL) 500 MG tablet Take 1,000 mg by mouth every 8 (eight) hours as needed (pain).    . cholecalciferol (VITAMIN D3) 25 MCG (1000 UT) tablet Take 1,000 Units by mouth daily.    . hydroxychloroquine (PLAQUENIL) 200 MG tablet Take 200 mg by mouth every morning.     . Multiple Vitamin (MULTIVITAMIN) capsule Take 1 capsule by mouth daily.    . rosuvastatin (CRESTOR) 5 MG tablet TAKE 1 TABLET(5 MG) BY MOUTH DAILY 90 tablet 1  . XARELTO 20 MG TABS tablet TAKE 1 TABLET BY MOUTH DAILY WITH SUPPER 90 tablet 3  . atenolol (TENORMIN) 50 MG tablet Take 1 tablet (50 mg total) by mouth daily. 90 tablet 3  . pantoprazole (PROTONIX) 40 MG tablet Take 1 tablet (40 mg total) by mouth daily. (Patient taking differently: Take 40 mg by mouth daily as needed (Acid Reflux). ) 30 tablet 2  . sertraline (ZOLOFT) 50 MG tablet Take 1 tablet (50 mg total) by mouth daily. 90 tablet 3   No facility-administered medications prior to visit.    ROS: Review of Systems  Objective:    BP (!) 170/88 (BP Location: Right Arm, Patient Position: Sitting, Cuff Size: Large)   Pulse (!) 54   Temp 98.2 F (36.8 C) (Oral)   Ht 5\' 5"  (1.651 m)   Wt 240 lb (108.9 kg)   SpO2 98%   BMI 39.94 kg/m   BP Readings from Last 3 Encounters:  06/28/20 (!) 170/88  06/24/20 (!) 148/88  06/22/20 138/80    Wt Readings from Last 3 Encounters:  06/28/20 240 lb (108.9 kg)  06/24/20 238 lb (108 kg)  06/22/20 235 lb 3.2 oz (106.7 kg)    Physical Exam  Lab Results  Component Value Date   WBC 6.3 05/19/2020   HGB 13.8 05/19/2020   HCT 40.3 05/19/2020   PLT 174 05/19/2020   GLUCOSE 82 06/02/2020   CHOL 167 07/03/2019   TRIG 72 07/03/2019   HDL 95 07/03/2019   LDLCALC 58 07/03/2019   ALT 19 05/19/2020   AST 23 05/19/2020   NA 130 (L) 06/02/2020   K 4.6 06/02/2020   CL 94 (L) 06/02/2020   CREATININE 0.83 06/02/2020   BUN 19 06/02/2020   CO2 23 06/02/2020   TSH 3.16 05/28/2018   INR 1.6 03/05/2016   HGBA1C 5.8 (H) 01/30/2015    US Abdomen Limited RUQ  Result Date: 06/23/2020 CLINICAL DATA:  RIGHT upper quadrant  discomfort/pain for 4 days, nausea, increased gas, history of gallstones EXAM: ULTRASOUND ABDOMEN LIMITED RIGHT UPPER QUADRANT COMPARISON:  CT abdomen pelvis 05/20/2020, ultrasound abdomen 01/25/2015 FINDINGS: Gallbladder: Shadowing calculus in gallbladder measuring 20 mm diameter. No gallbladder wall thickening, or pericholecystic fluid. Presence/absence of a sonographic Percell Miller sign was not noted by the sonographer. Common bile duct: Diameter: 3 mm, normal Liver: Heterogeneous upper normal parenchymal echogenicity of the liver. Smooth margins. Tiny subcapsular cyst in LEFT lobe 7 x 6 x 6 mm, also present on prior CT. No additional hepatic mass. Portal vein is patent on color Doppler imaging with normal direction of blood flow towards the liver. Other: No RIGHT upper quadrant free fluid. IMPRESSION: Cholelithiasis without evidence of acute cholecystitis or biliary dilatation.  Mildly heterogeneous parenchymal echogenicity of the liver with a 7 mm LEFT lobe hepatic cyst. Electronically Signed   By: Lavonia Dana M.D.   On: 06/23/2020 15:11    Assessment & Plan:   Diagnoses and all orders for this visit:  Essential hypertension  Persistent atrial fibrillation (HCC)  Urticaria  Other orders -     sertraline (ZOLOFT) 50 MG tablet; Take 0.5 tablets (25 mg total) by mouth daily. -     atenolol (TENORMIN) 50 MG tablet; Take 1 tablet (50 mg total) by mouth 2 (two) times daily. -     famotidine (PEPCID) 40 MG tablet; Take 1 tablet (40 mg total) by mouth daily. -     cetirizine (ZYRTEC ALLERGY) 10 MG tablet; Take 1 tablet (10 mg total) by mouth daily. -     methylPREDNISolone (MEDROL DOSEPAK) 4 MG TBPK tablet; As directed     Meds ordered this encounter  Medications  . sertraline (ZOLOFT) 50 MG tablet    Sig: Take 0.5 tablets (25 mg total) by mouth daily.    Dispense:  90 tablet    Refill:  3  . atenolol (TENORMIN) 50 MG tablet    Sig: Take 1 tablet (50 mg total) by mouth 2 (two) times daily.    Dispense:  180 tablet    Refill:  3  . famotidine (PEPCID) 40 MG tablet    Sig: Take 1 tablet (40 mg total) by mouth daily.    Dispense:  90 tablet    Refill:  3  . cetirizine (ZYRTEC ALLERGY) 10 MG tablet    Sig: Take 1 tablet (10 mg total) by mouth daily.    Dispense:  90 tablet    Refill:  3  . methylPREDNISolone (MEDROL DOSEPAK) 4 MG TBPK tablet    Sig: As directed    Dispense:  21 tablet    Refill:  0     Follow-up: Return in about 3 months (around 09/27/2020) for a follow-up visit.  [name], MD

## 2020-06-28 NOTE — Assessment & Plan Note (Signed)
I was able to track her sodium levels back to 2014.  There were low normal to slightly decreased sodium levels off and on throughout.  We will continue to monitor.  Obtain sodium levels in 6 weeks.  I doubt her sodium levels are decreased due to Zoloft use.  However, she can reduce Zoloft to 25 mg a day.  She will keep her fluid intake under 2 L a day.

## 2020-06-28 NOTE — Assessment & Plan Note (Signed)
Worse Atenolol - increase to BID

## 2020-06-29 DIAGNOSIS — H2513 Age-related nuclear cataract, bilateral: Secondary | ICD-10-CM | POA: Diagnosis not present

## 2020-06-29 DIAGNOSIS — L820 Inflamed seborrheic keratosis: Secondary | ICD-10-CM | POA: Diagnosis not present

## 2020-06-29 DIAGNOSIS — H31091 Other chorioretinal scars, right eye: Secondary | ICD-10-CM | POA: Diagnosis not present

## 2020-06-29 DIAGNOSIS — H43812 Vitreous degeneration, left eye: Secondary | ICD-10-CM | POA: Diagnosis not present

## 2020-07-04 ENCOUNTER — Ambulatory Visit: Payer: Medicare Other | Admitting: Internal Medicine

## 2020-07-04 DIAGNOSIS — M2550 Pain in unspecified joint: Secondary | ICD-10-CM | POA: Diagnosis not present

## 2020-07-04 DIAGNOSIS — Z79899 Other long term (current) drug therapy: Secondary | ICD-10-CM | POA: Diagnosis not present

## 2020-07-04 DIAGNOSIS — M150 Primary generalized (osteo)arthritis: Secondary | ICD-10-CM | POA: Diagnosis not present

## 2020-07-04 DIAGNOSIS — E669 Obesity, unspecified: Secondary | ICD-10-CM | POA: Diagnosis not present

## 2020-07-04 DIAGNOSIS — M545 Low back pain: Secondary | ICD-10-CM | POA: Diagnosis not present

## 2020-07-04 DIAGNOSIS — M0609 Rheumatoid arthritis without rheumatoid factor, multiple sites: Secondary | ICD-10-CM | POA: Diagnosis not present

## 2020-07-04 DIAGNOSIS — Z6837 Body mass index (BMI) 37.0-37.9, adult: Secondary | ICD-10-CM | POA: Diagnosis not present

## 2020-07-05 NOTE — Progress Notes (Unsigned)
[facility] PREP Weekly Session   Patient Details  Name: Amanda Wilkins MRN: [id number] Date of Birth: 01-17-1946 Age: 74 y.o. PCP: [name], MD  Vitals:   07/05/20 1612  Weight: 235 lb (106.6 kg)     [facility] Weekly seesion - 07/05/20 1600      Weekly Session   Topic Discussed Importance of resistance training;Other ways to be active    Minutes exercised this week 120 minutes    Classes attended to date 3          Fun things since last meeting: mini golf, corn hole Grateful for: my family and life Nutrition celebration: ate very well Barriers/struggles: hunger Talked about refueling after exercise   Barnett Hatter 07/05/2020, 4:14 PM

## 2020-07-06 ENCOUNTER — Other Ambulatory Visit: Payer: Self-pay

## 2020-07-06 ENCOUNTER — Telehealth (INDEPENDENT_AMBULATORY_CARE_PROVIDER_SITE_OTHER): Payer: Medicare Other | Admitting: Cardiology

## 2020-07-06 ENCOUNTER — Encounter: Payer: Self-pay | Admitting: Cardiology

## 2020-07-06 VITALS — BP 139/68 | HR 70 | Ht 65.0 in | Wt 235.0 lb

## 2020-07-06 DIAGNOSIS — Z9989 Dependence on other enabling machines and devices: Secondary | ICD-10-CM | POA: Diagnosis not present

## 2020-07-06 DIAGNOSIS — E669 Obesity, unspecified: Secondary | ICD-10-CM | POA: Diagnosis not present

## 2020-07-06 DIAGNOSIS — I10 Essential (primary) hypertension: Secondary | ICD-10-CM | POA: Diagnosis not present

## 2020-07-06 DIAGNOSIS — G4733 Obstructive sleep apnea (adult) (pediatric): Secondary | ICD-10-CM

## 2020-07-06 NOTE — Progress Notes (Addendum)
Virtual Visit via Telephone Note   This visit type was conducted due to national recommendations for restrictions regarding the COVID-19 Pandemic (e.g. social distancing) in an effort to limit this patient's exposure and mitigate transmission in our community.  Due to her co-morbid illnesses, this patient is at least at moderate risk for complications without adequate follow up.  This format is felt to be most appropriate for this patient at this time.  All issues noted in this document were discussed and addressed.  A limited physical exam was performed with this format.  Please refer to the patient's chart for her consent to telehealth for [facility].   Evaluation Performed:  Follow-up visit  This visit type was conducted due to national recommendations for restrictions regarding the COVID-19 Pandemic (e.g. social distancing).  This format is felt to be most appropriate for this patient at this time.  All issues noted in this document were discussed and addressed.  No physical exam was performed (except for noted visual exam findings with Video Visits).  Please refer to the patient's chart (MyChart message for video visits and phone note for telephone visits) for the patient's consent to telehealth for [facility].  Date:  07/06/2020   ID:  Amanda Wilkins, DOB Apr 28, 1946, MRN [id number]  Patient Location:  Home  Provider location:   [place]  PCP:  Plotnikov, Evie Lacks, MD  Cardiologist:  [name], MD  Sleep Medicine:  Fransico Him, MD Electrophysiologist:  None   Chief Complaint:  OSA  History of Present Illness:    Amanda Wilkins is a 74 y.o. female who presents via audio/video conferencing for a telehealth visit today.    Amanda Wilkins is a 74 y.o. female with a hx of paroxysmal atrial fibrillation and flutter, hypertension and sleep apnea. Due to ongoing hypertension, snoring atrial fibrillation sleep study was ordered showing moderate obstructive sleep  apnea with an AHI of 23.4/h and oxygen desaturations as low as 84%.  She subsequently underwent CPAP titration to 12 cm H2O.    She is doing well with her CPAP device and thinks that she has gotten used to it.  She tolerates the mask and feels the pressure is adequate.  Since going on CPAP she feels rested in the am and has no significant daytime sleepiness.  She denies any significant mouth or nasal dryness or nasal congestion.  She does not think that he snores.     The patient does not have symptoms concerning for COVID-19 infection (fever, chills, cough, or new shortness of breath).   Prior CV studies:   The following studies were reviewed today:  PAP compliance download  Past Medical History:  Diagnosis Date  . Anticoagulant long-term use    Xarelto  . Chronic interstitial nephritis   . Depression   . History of interstitial nephritis 2016   chronic  . History of nuclear stress test 12/16/2014   Intermediate risk nuclear study w/ medium size moderate severity reversible defect in the basal and mid inferolateral and inferior wall (per dr cardiology note , dr [name] did not think this was consistent with ischemia)/  normal LV function and wall motion, ef 75%  . History of septic shock 01/22/2015   in setting Group A Strep Cellulits erysipelas/chest wall induration with streptoccocus basterium-- Severe sepsis, DIC, Acute respiratory failure with pulmonary edema, Acute Kidney failure with chronic interstitial nephritis  . Hypertension   . Hyponatremia   . Migraines    on Zoloft for migraines  .  Mild carotid artery disease ([place])    per duplex 08-30-2017 bilateral ICA 1-39%  . OA (osteoarthritis) rheumotologist-  dr [name]   both knees,  shoulders, ankles  . OSA on CPAP     moderate obstructive sleep apnea with an AHI of 23.4/h and oxygen desaturations as low as 84%.  Now on CPAP at 12 cm H2O.  [name] PAF (paroxysmal atrial fibrillation) [facility]) 2009   cardiologist-- dr [name]  . Paroxysmal atrial flutter ([place])    a. dx 11/2017.  [name] PSVT (paroxysmal supraventricular tachycardia) ([place])   . Wears contact lenses    Past Surgical History:  Procedure Laterality Date  . CESAREAN SECTION  1980  . ESOPHAGOGASTRODUODENOSCOPY N/A 02/08/2015   Procedure: ESOPHAGOGASTRODUODENOSCOPY (EGD);  Surgeon: [name], MD;  Location: [place];  Service: Endoscopy;  Laterality: N/A;  . KNEE ARTHROSCOPY W/ MENISCAL REPAIR Left 08/2014    @[facility]   . SHOULDER SURGERY Right 04/04/2016  . TOTAL KNEE ARTHROPLASTY Right 09/01/2018   Procedure: RIGHT TOTAL KNEE ARTHROPLASTY;  Surgeon: [name], MD;  [facility];  Service: Orthopedics;  Laterality: Right;     Current Meds  Medication Sig  . acetaminophen (TYLENOL) 500 MG tablet Take 1,000 mg by mouth every 8 (eight) hours as needed (pain).  [name] atenolol (TENORMIN) 50 MG tablet Take 1 tablet (50 mg total) by mouth 2 (two) times daily.  . cetirizine (ZYRTEC ALLERGY) 10 MG tablet Take 1 tablet (10 mg total) by mouth daily.  . cholecalciferol (VITAMIN D3) 25 MCG (1000 UT) tablet Take 1,000 Units by mouth daily.  . famotidine (PEPCID) 40 MG tablet Take 1 tablet (40 mg total) by mouth daily.  . hydroxychloroquine (PLAQUENIL) 200 MG tablet Take 200 mg by mouth every morning.   . Multiple Vitamin (MULTIVITAMIN) capsule Take 1 capsule by mouth daily.  . rosuvastatin (CRESTOR) 5 MG tablet TAKE 1 TABLET(5 MG) BY MOUTH DAILY  . sertraline (ZOLOFT) 50 MG tablet Take 0.5 tablets (25 mg total) by mouth daily. (Patient taking differently: Take 50 mg by mouth daily. )  . XARELTO 20 MG TABS tablet TAKE 1 TABLET BY MOUTH DAILY WITH SUPPER     Allergies:   Epinephrine base, Aspirin, Hylan g-f 20, Penicillins, and Tape   Social History   Tobacco Use  . Smoking status: Never Smoker  . Smokeless tobacco: Never Used  . Tobacco comment: H/O social smoking at times when drinking--never a "habit" (in the 1970s)  Vaping Use  . Vaping Use:  Never used  Substance Use Topics  . Alcohol use: Yes    Alcohol/week: 0.0 standard drinks    Comment: Occasional  . Drug use: Never     Family Hx: The patient's family history includes Breast cancer (age of onset: 59) in her sister; Lymphoma in her mother; Pancreatic cancer in her brother; Prostate cancer in her father.  ROS:   Please see the history of present illness.     All other systems reviewed and are negative.   Labs/Other Tests and Data Reviewed:    Recent Labs: 12/25/2019: NT-Pro BNP 365 05/19/2020: ALT 19; Hemoglobin 13.8; Platelets 174 06/02/2020: BUN 19; Creatinine, Ser 0.83; Magnesium 2.2; Potassium 4.6; Sodium 130   Recent Lipid Panel Lab Results  Component Value Date/Time   CHOL 167 07/03/2019 02:58 PM   TRIG 72 07/03/2019 02:58 PM   HDL 95 07/03/2019 02:58 PM   CHOLHDL 1.8 07/03/2019 02:58 PM   CHOLHDL 2 05/28/2018 08:37 AM   LDLCALC 58  07/03/2019 02:58 PM    Wt Readings from Last 3 Encounters:  07/06/20 235 lb (106.6 kg)  07/05/20 235 lb (106.6 kg)  06/28/20 240 lb (108.9 kg)     Objective:    Vital Signs:  BP 139/68   Pulse 70   Ht 5\' 5"  (1.651 m)   Wt 235 lb (106.6 kg)   BMI 39.11 kg/m     ASSESSMENT & PLAN:    1.  OSA -  The patient is tolerating PAP therapy well without any problems. The PAP download was reviewed today and showed an AHI of 2.7/hr on 12 cm H2O with 90% compliance in using more than 4 hours nightly.  The patient has been using and benefiting from PAP use and will continue to benefit from therapy.   2.  Hypertension  -BP controlled -continue Atenolol 50mg  daily  3.  Morbid Obesity  -She has started in an exercise program through [facility] -I have encouraged her to cut back on carbs and portions.   COVID-19 Education: The signs and symptoms of COVID-19 were discussed with the patient and how to seek care for testing (follow up with PCP or arrange E-visit).  The importance of social distancing was discussed  today.  Patient Risk:   After full review of this patient's clinical status, I feel that they are at least moderate risk at this time.  Time:   Today, I have spent 20 minutes on telemedicine discussing medical problems including OSA, HTN and reviewing patient's chart including PAP compliance download.  Medication Adjustments/Labs and Tests Ordered: Current medicines are reviewed at length with the patient today.  Concerns regarding medicines are outlined above.  Tests Ordered: No orders of the defined types were placed in this encounter.  Medication Changes: No orders of the defined types were placed in this encounter.   Disposition:  Follow up in 1 year(s)  Signed, Fransico Him, MD  07/06/2020 9:36 AM    [facility]

## 2020-07-06 NOTE — Patient Instructions (Signed)

## 2020-07-09 DIAGNOSIS — Z23 Encounter for immunization: Secondary | ICD-10-CM | POA: Diagnosis not present

## 2020-07-11 ENCOUNTER — Ambulatory Visit: Payer: Medicare Other

## 2020-07-19 NOTE — Progress Notes (Signed)
[facility] [employer] PRE[facility] Weekly Session   Patient Details  Name: Amanda Wilkins MRN: [id number] Date of Birth: 1945-10-20 Age: 74 y.o. PCP: [name], MD  Vitals:   07/19/20 1449  Weight: 237 lb (107.5 kg)     [facility] [employer] Weekly seesion - 07/19/20 1400      Weekly Session   Topic Discussed Health habits   salt demo   Minutes exercised this week 180 minutes    Classes attended to date 5          Fun things you did since last meeting: walked and toured [place] Grateful for: life Nutrition celebration: no wine for 3 wks, except 2 glasses in [place] Barriers/struggles: wine   Barnett Hatter 07/19/2020, 2:49 PM

## 2020-07-26 NOTE — Progress Notes (Signed)
[facility] [employer] PREP Weekly Session   Patient Details  Name: Amanda Wilkins MRN: [id number] Date of Birth: [birth year] Age: 74 y.o. PCP: [name], MD  Vitals:   07/26/20 1621  Weight: 233 lb 9.6 oz (106 kg)     [facility] [employer] Weekly seesion - 07/26/20 1600      Weekly Session   Topic Discussed Restaurant Eating   sugar demo   Minutes exercised this week 300 minutes    Classes attended to date 7          Fun things since last meeting: pool, cornhole, games Grateful for: family, friends, this class Nutrition celelbration: no wine Barriers: licorice   Pam Tally Joe 07/26/2020, 4:22 PM

## 2020-08-02 NOTE — Progress Notes (Signed)
[facility] [employer] PREP Weekly Session   Patient Details  Name: Amanda Wilkins MRN: [id number] Date of Birth: August 12, 1946 Age: 74 y.o. PCP: [name], MD  Vitals:   08/02/20 1447  Weight: 237 lb (107.5 kg)     [facility] [employer] Weekly seesion - 08/02/20 1400      Weekly Session   Topic Discussed Stress management and problem solving   Meditation and breathwork   Minutes exercised this week 330 minutes   Encouragd changing intensity and decreasing cardio down   Classes attended to date 63          Fun things isnce last meeting: movie, outdoor games Grateful for: children, grandchildren, husband Barriers/Struggles: wine, chocolate, water and popcorn Barnett Hatter 08/02/2020, 2:49 PM

## 2020-08-03 ENCOUNTER — Ambulatory Visit (INDEPENDENT_AMBULATORY_CARE_PROVIDER_SITE_OTHER): Payer: Medicare Other | Admitting: Allergy

## 2020-08-03 ENCOUNTER — Other Ambulatory Visit: Payer: Self-pay

## 2020-08-03 ENCOUNTER — Encounter: Payer: Self-pay | Admitting: Allergy

## 2020-08-03 VITALS — BP 108/68 | HR 68 | Temp 98.0°F | Resp 20 | Ht 64.0 in | Wt 238.0 lb

## 2020-08-03 DIAGNOSIS — L500 Allergic urticaria: Secondary | ICD-10-CM

## 2020-08-03 MED ORDER — EPINEPHRINE 0.3 MG/0.3ML IJ SOAJ
0.3000 mg | INTRAMUSCULAR | 1 refills | Status: DC | PRN
Start: 2020-08-03 — End: 2022-04-02

## 2020-08-03 NOTE — Patient Instructions (Addendum)
Urticaria  -at this time etiology of hives and/or swelling is unknown.  Hives can be caused by a variety of different triggers including illness/infection, foods, medications, stings, exercise, pressure, vibrations, extremes of temperature to name a few however majority of the time there is no identifiable trigger.    - testing today to select foods is slightly reactive to shellfish mix and lobster  - will obtain serum IgE to shellfish as well as to paprika, carmine and a tryptase level  - if hives/swelling return start use of cetirizine 10mg  and pepcid 20mg  daily  - should significant symptoms recur or new symptoms occur, a journal is to be kept recording any foods eaten, beverages consumed, medications taken, activities performed, and environmental conditions within a 6 hour time period prior to the onset of symptoms.  Follow-up in 3-4 months or sooner if needed  **We are ordering labs, so please allow 1-2 weeks for the results to come back.  With the newly implemented Cures Act, the labs might be visible to you at the same time that they become visible to me.  However, I will not address the results until all of the results come  back, so please be patient.  In the meantime, continue avoiding your triggering food(s) in your After Visit Summary, including avoidance measures (if applicable), until you hear from me about the results.

## 2020-08-03 NOTE — Progress Notes (Signed)
New Patient Note  RE: Amanda Wilkins MRN: [id number] DOB: 05/12/1946 Date of Office Visit: 08/03/2020  Referring provider: [name], MD Primary care provider: [name], MD  Chief Complaint: Hives/reaction   History of present illness: Amanda Wilkins is a 74 y.o. female presenting today for consultation for urticaria.  About 1.5 months ago she had imitation crab shredded from a package over salad and about 3 hours later (she states this is an estimate of time past) while in bed she felt itchy.  She then noted that she had welts on her wrist.  Those went down and the welts moved around on her body.  Denies any difficulty breathing or swallowing, no nasuea or vomiting or any lightheadedness.  She states she did not take anything at that time.  She states she was told not to take benadryl as it makes her heart race.  She states the next day she felt like it was getting better however as the day went on she had more recurrence of the itch and welts.  She went to UC and received pepcid and low dose prednisone.  She saw her PCP the next day as she continued to have the rash and she received higher dose of prednisone. Rash resolved after about 5-7 days and has not returned.  Denies any swelling, joint aches/pains or fevers with the rash. Has had imitation crab before in sushi.   When she eats strawberries she reports (20-30 years ago she noted this issue) that it feels like something is "crawling in my veins".  Denies it feeling itchy but weird sensation.  She has been avoiding strawberry and strawberry products.   She denies have welts/hives before the above incident.   The imitation crab ingredients list include: Crab, fish oil, paprika, carmine, egg whites, lobster, [place] pol[place], red wine, yam, shellfish, sea salt, pyrophosphate.  Review of systems in the past 4 weeks: Review of Systems  Constitutional: Negative.   HENT: Negative.   Eyes: Negative.     Respiratory: Negative.   Cardiovascular: Negative.   Gastrointestinal: Negative.   Musculoskeletal: Negative.   Skin: Negative.   Neurological: Negative.     All other systems negative unless noted above in HPI  Past medical history: Past Medical History:  Diagnosis Date  . Anticoagulant long-term use    Xarelto  . Chronic interstitial nephritis   . Depression   . History of interstitial nephritis 2016   chronic  . History of nuclear stress test 12/16/2014   Intermediate risk nuclear study w/ medium size moderate severity reversible defect in the basal and mid inferolateral and inferior wall (per dr cardiology note , dr [name] did not think this was consistent with ischemia)/  normal LV function and wall motion, ef 75%  . History of septic shock 01/22/2015   in setting Group A Strep Cellulits erysipelas/chest wall induration with streptoccocus basterium-- Severe sepsis, DIC, Acute respiratory failure with pulmonary edema, Acute Kidney failure with chronic interstitial nephritis  . Hypertension   . Hyponatremia   . Migraines    on Zoloft for migraines  . Mild carotid artery disease ([place])    per duplex 08-30-2017 bilateral ICA 1-39%  . OA (osteoarthritis) rheumotologist-  dr [name]   both knees,  shoulders, ankles  . OSA on CPAP     moderate obstructive sleep apnea with an AHI of 23.4/h and oxygen desaturations as low as 84%.  Now on CPAP at 12 cm H2O.  [name] PAF (  paroxysmal atrial fibrillation) [facility]) 2009   cardiologist-- dr [name]  . Paroxysmal atrial flutter ([place])    a. dx 11/2017.  [name] PSVT (paroxysmal supraventricular tachycardia) ([place])   . Wears contact lenses     Past surgical history: Past Surgical History:  Procedure Laterality Date  . CESAREAN SECTION  1980  . ESOPHAGOGASTRODUODENOSCOPY N/A 02/08/2015   Procedure: ESOPHAGOGASTRODUODENOSCOPY (EGD);  Surgeon: [name], MD;  Location: [place];  Service: Endoscopy;  Laterality: N/A;  . KNEE  ARTHROSCOPY W/ MENISCAL REPAIR Left 08/2014    @[facility]   . SHOULDER SURGERY Right 04/04/2016  . TOTAL KNEE ARTHROPLASTY Right 09/01/2018   Procedure: RIGHT TOTAL KNEE ARTHROPLASTY;  Surgeon: [name], MD;  [facility];  Service: Orthopedics;  Laterality: Right;    Family history:  Family History  Problem Relation Age of Onset  . Pancreatic cancer Brother   . Lymphoma Mother   . Prostate cancer Father        metastatic prostate cancer  . Breast cancer Sister 58  . Allergic rhinitis Sister   . Urticaria Sister   . Asthma Neg Hx   . Eczema Neg Hx   . Immunodeficiency Neg Hx   . Atopy Neg Hx   . Angioedema Neg Hx     Social history: She lives in a home without carpeting with gas heating and central cooling.  No pets in the home.  There is no concern for water damage, mildew or roaches in the home.  She is retired.  She denies a smoking history.  Medication List: Current Outpatient Medications  Medication Sig Dispense Refill  . acetaminophen (TYLENOL) 500 MG tablet Take 1,000 mg by mouth every 8 (eight) hours as needed (pain).    [name] atenolol (TENORMIN) 50 MG tablet Take 1 tablet (50 mg total) by mouth 2 (two) times daily. 180 tablet 3  . cetirizine (ZYRTEC ALLERGY) 10 MG tablet Take 1 tablet (10 mg total) by mouth daily. 90 tablet 3  . cholecalciferol (VITAMIN D3) 25 MCG (1000 UT) tablet Take 1,000 Units by mouth daily.    . famotidine (PEPCID) 40 MG tablet Take 1 tablet (40 mg total) by mouth daily. 90 tablet 3  . hydroxychloroquine (PLAQUENIL) 200 MG tablet Take 200 mg by mouth every morning.     . Multiple Vitamin (MULTIVITAMIN) capsule Take 1 capsule by mouth daily.    . rosuvastatin (CRESTOR) 5 MG tablet TAKE 1 TABLET(5 MG) BY MOUTH DAILY 90 tablet 1  . sertraline (ZOLOFT) 50 MG tablet Take 0.5 tablets (25 mg total) by mouth daily. (Patient taking differently: Take 50 mg by mouth daily. ) 90 tablet 3  . XARELTO 20 MG TABS tablet TAKE 1 TABLET BY MOUTH DAILY WITH SUPPER 90  tablet 3   No current facility-administered medications for this visit.    Known medication allergies: Allergies  Allergen Reactions  . Epinephrine Base Other (See Comments)    Seriously increases heart rate  . Aspirin Other (See Comments)    Can take the coated 325 mg. Plain asa 325 mg speeds up the heart.  . Benadryl [Diphenhydramine]     Increased heart rate  . Hylan G-F 20 Other (See Comments)    Very painful (knee injection) Very painful (knee injection)  . Penicillins Other (See Comments)    Pt previously has been told not to take because of family history of reactions (water blisters). She took amoxicillin in 2012-2013 with no reaction  . Tape Other (See Comments)  Blisters from adhesive tape     Physical examination: Blood pressure 108/68, pulse 68, temperature 98 F (36.7 C), temperature source Oral, resp. rate 20, height 5\' 4"  (1.626 m), weight 238 lb (108 kg), SpO2 96 %.  General: Alert, interactive, in no acute distress. HEENT: PERRLA, TMs pearly gray, turbinates non-edematous without discharge, post-pharynx non erythematous. Neck: Supple without lymphadenopathy. Lungs: Clear to auscultation without wheezing, rhonchi or rales. {no increased work of breathing. CV: Normal S1, S2 without murmurs. Abdomen: Nondistended, nontender. Skin: Warm and dry, without lesions or rashes. Extremities:  No clubbing, cyanosis or edema. Neuro:   Grossly intact.  Diagnositics/Labs:  Allergy testing: Select food allergy skin prick testing is reactive to shellfish mix and lobster. Allergy testing results were read and interpreted by provider, documented by clinical staff.   Assessment and plan:   Urticaria  -at this time etiology of hives and/or swelling is unknown.  Hives can be caused by a variety of different triggers including illness/infection, foods, medications, stings, exercise, pressure, vibrations, extremes of temperature to name a few however majority of the time  there is no identifiable trigger.    - testing today to select foods is slightly reactive to shellfish mix and lobster  - will obtain serum IgE to shellfish as well as to paprika, carmine and a tryptase level  - if hives/swelling return start use of cetirizine 10mg  and pepcid 20mg  daily  - should significant symptoms recur or new symptoms occur, a journal is to be kept recording any foods eaten, beverages consumed, medications taken, activities performed, and environmental conditions within a 6 hour time period prior to the onset of symptoms.  -Recommend she have access to an epinephrine device in case of allergic reaction.  Follow emergency action plan.  Epinephrine is still the medication of choice for severe allergic reaction symptoms.  Increase in heart rate is a known potential side effect related to epinephrine use.  Follow-up in 3-4 months or sooner if needed  I appreciate the opportunity to take part in Daleisa's care. Please do not hesitate to contact me with questions.  Sincerely,   Prudy Feeler, MD Allergy/Immunology [facility]

## 2020-08-05 ENCOUNTER — Other Ambulatory Visit: Payer: Self-pay

## 2020-08-05 ENCOUNTER — Ambulatory Visit
Admission: RE | Admit: 2020-08-05 | Discharge: 2020-08-05 | Disposition: A | Discharge status: Home / Self Care | Payer: Medicare Other | Source: Ambulatory Visit | Attending: Obstetrics & Gynecology | Admitting: Obstetrics & Gynecology

## 2020-08-05 DIAGNOSIS — Z1231 Encounter for screening mammogram for malignant neoplasm of breast: Secondary | ICD-10-CM

## 2020-08-05 DIAGNOSIS — L500 Allergic urticaria: Secondary | ICD-10-CM | POA: Diagnosis not present

## 2020-08-09 LAB — ALLERGEN,GRN PEPPER,PAPRIKA,F218: Paprika IgE: <0.1 kU/L

## 2020-08-09 LAB — ALLERGEN PROFILE, SHELLFISH
Clam IgE: <0.1 kU/L
F023-IgE Crab: <0.1 kU/L
F080-IgE Lobster: <0.1 kU/L
F290-IgE Oyster: <0.1 kU/L
Scallop IgE: <0.1 kU/L
Shrimp IgE: <0.1 kU/L

## 2020-08-09 LAB — ALLERGEN, RED (CARMINE) DYE, RF340: F340-IgE Carmine Red Dye: <0.1 kU/L

## 2020-08-09 LAB — TRYPTASE: Tryptase: 4.4 ug/L (ref 2.2–13.2)

## 2020-08-09 NOTE — Progress Notes (Signed)
[facility] [employer] PRE[facility] Weekly Session   Patient Details  Name: Amanda Wilkins MRN: [id number] Date of Birth: 17-Feb-1946 Age: 74 y.o. PCP: [name], MD  Vitals:   08/09/20 1501  Weight: 234 lb (106.1 kg)     [facility] [employer] Weekly seesion - 08/09/20 1500      Weekly Session   Topic Discussed Expectations and non-scale victories    Minutes exercised this week 180 minutes    Classes attended to date 66          Fun things since last meeting: take a walk by myself, cornhole Grateful for: nature Nutrition celebration: disillusioned but won't give up Barriers/struggles: wine  Barnett Hatter 08/09/2020, 3:02 PM

## 2020-08-10 DIAGNOSIS — Z23 Encounter for immunization: Secondary | ICD-10-CM | POA: Diagnosis not present

## 2020-08-10 DIAGNOSIS — Z79899 Other long term (current) drug therapy: Secondary | ICD-10-CM | POA: Diagnosis not present

## 2020-08-16 NOTE — Progress Notes (Signed)
[facility] [employer] PREP Weekly Session   Patient Details  Name: Yun Gutierrez MRN: [id number] Date of Birth: [birth year] Age: 74 y.o. PCP: Plotnikov, Evie Lacks, MD  Vitals:   08/16/20 1502  Weight: 237 lb 12.8 oz (107.9 kg)     [facility] [employer] Weekly seesion - 08/16/20 1500      Weekly Session   Topic Discussed --   Portion control   Minutes exercised this week 330 minutes    Classes attended to date 33          Fun things you did since last meeting: fishing, pool, walking  Grateful for: feeling good Barriers: Frustrated weight hasn't dropped yet. talked about 80/20 rule, standing on plan for meals 80 % of the time and then enjoying the other 20% of time. Balancing workouts to include more flexibility and decreasing cardio. Ensuring sleep and rest days are prioritized and not back to back.   Barnett Hatter 08/16/2020, 3:03 PM

## 2020-08-22 ENCOUNTER — Other Ambulatory Visit: Payer: Self-pay

## 2020-08-22 ENCOUNTER — Ambulatory Visit (INDEPENDENT_AMBULATORY_CARE_PROVIDER_SITE_OTHER): Payer: Medicare Other | Admitting: Podiatry

## 2020-08-22 ENCOUNTER — Ambulatory Visit (INDEPENDENT_AMBULATORY_CARE_PROVIDER_SITE_OTHER): Payer: Medicare Other

## 2020-08-22 DIAGNOSIS — M779 Enthesopathy, unspecified: Secondary | ICD-10-CM

## 2020-08-22 DIAGNOSIS — M79672 Pain in left foot: Secondary | ICD-10-CM

## 2020-08-23 NOTE — Progress Notes (Signed)
[facility] [employer] PREP Weekly Session   Patient Details  Name: Amanda Wilkins MRN: [id number] Date of Birth: 1946-01-02 Age: 74 y.o. PCP: [name], MD  Vitals:   08/23/20 1421  Weight: 234 lb 3.2 oz (106.2 kg)     [facility] [employer] Weekly sees[facility] - 08/23/20 1400      Weekly Session   Topic Discussed Finding support    Minutes exercised this week 285 minutes    Classes attended to date 76          Fun things since last meeting: car shopped, met family for dinner Grateful for: my health and family health Nutrition celebration: lemon juice every morning Barriers/struggles: bag of licorice   Barnett Hatter 08/23/2020, 2:23 PM

## 2020-08-26 ENCOUNTER — Other Ambulatory Visit: Payer: Self-pay | Admitting: Internal Medicine

## 2020-08-27 ENCOUNTER — Ambulatory Visit: Payer: Medicare Other

## 2020-08-29 NOTE — Progress Notes (Signed)
Subjective:   Patient ID: Amanda Wilkins, female   DOB: 74 y.o.   MRN: [id number]   HPI Patient states she is developed a lot of pain in the bottom of her left foot and its just occurred over the last couple months.  She feels like she is walking on a walk   ROS      Objective:  Physical Exam  Neurovascular status intact with inflammation pain of the second MPJ left with fluid buildup around the joint surface     Assessment:  Inflammatory capsulitis second MPJ left     Plan:  H&P reviewed condition and recommended continued conservative treatment.  I did sterile prep of the area I then aspirated the area and carefully put medicine into the joint consisting of quarter cc of Kenalog dexamethasone.  Advised on rigid bottom shoes padding therapy and will be seen back if symptoms persist  X-rays were negative for signs of fracture and did not indicate any form of advanced arthritis

## 2020-08-30 ENCOUNTER — Other Ambulatory Visit: Payer: Self-pay | Admitting: Podiatry

## 2020-08-30 DIAGNOSIS — M779 Enthesopathy, unspecified: Secondary | ICD-10-CM

## 2020-08-30 NOTE — Progress Notes (Signed)
[facility] PREP Weekly Session   Patient Details  Name: Amanda Wilkins MRN: [id number] Date of Birth: 18-Mar-1946 Age: 74 y.o. PCP: [name], MD  Vitals:   08/30/20 1423  Weight: 234 lb 9.6 oz (106.4 kg)     [facility] Weekly seesion - 08/30/20 1400      Weekly Session   Topic Discussed Eating for the season    Minutes exercised this week 290 minutes    Classes attended to date 37          Fun things since last meeting: walking and playing Grateful for: walking long distances Consistently eating very healthy, very minimal simple carbohydrates. Talked about using cheat meals to induce weight loss.   Barnett Hatter 08/30/2020, 2:24 PM

## 2020-08-31 ENCOUNTER — Encounter: Payer: Self-pay | Admitting: Podiatry

## 2020-08-31 ENCOUNTER — Other Ambulatory Visit: Payer: Self-pay

## 2020-08-31 ENCOUNTER — Ambulatory Visit (INDEPENDENT_AMBULATORY_CARE_PROVIDER_SITE_OTHER): Payer: Medicare Other | Admitting: Podiatry

## 2020-08-31 DIAGNOSIS — M779 Enthesopathy, unspecified: Secondary | ICD-10-CM | POA: Diagnosis not present

## 2020-08-31 NOTE — Progress Notes (Signed)
Subjective:   Patient ID: Amanda Wilkins, female   DOB: 74 y.o.   MRN: [id number]   HPI Patient states I was doing well but I developed pain now in my joint left that is been sore  ROS      Objective:  Physical Exam  Inflammation of the left sinus tarsi with fluid buildup     Assessment:  Probability for sinus tarsitis left acute     Plan:  Reviewed the consideration for boot usage that she has at home did sterile prep injected the sinus tarsi 3 mg Kenalog 5 mg liken applied sterile dressing  X-ray is negative for signs of fracture or bone pathology

## 2020-08-31 NOTE — Progress Notes (Signed)
Subjective:   Patient ID: Amanda Wilkins, female   DOB: 74 y.o.   MRN: [id number]   HPI Patient presents stating her ankle has started to get sore over the last few weeks and she does not remember injury   ROS      Objective:  Physical Exam  Neurovascular status intact with pain within the lateral ankle gutter into the peroneal complex and slightly into the sinus tarsi     Assessment:  Inflammatory capsulitis of the ankle joint left     Plan:  Sterile prep injected the ankle joint 3 mg Dexasone Kenalog 5 mg Xylocaine and advised on reduced activity and boot usage as needed.  Reappoint as symptoms indicate  X-rays indicate no signs of fracture with no indication diastases injury

## 2020-09-06 DIAGNOSIS — Z6841 Body Mass Index (BMI) 40.0 and over, adult: Secondary | ICD-10-CM | POA: Diagnosis not present

## 2020-09-06 DIAGNOSIS — R635 Abnormal weight gain: Secondary | ICD-10-CM | POA: Diagnosis not present

## 2020-09-06 DIAGNOSIS — I10 Essential (primary) hypertension: Secondary | ICD-10-CM | POA: Diagnosis not present

## 2020-09-06 DIAGNOSIS — R7303 Prediabetes: Secondary | ICD-10-CM | POA: Diagnosis not present

## 2020-09-06 NOTE — Progress Notes (Signed)
[facility] PREP Weekly Session   Patient Details  Name: Amanda Wilkins MRN: [id number] Date of Birth: 09-14-1946 Age: 74 y.o. PCP: [name], MD  Vitals:   09/06/20 1430  Weight: 231 lb 12.8 oz (105.1 kg)     [facility] Weekly seesion - 09/06/20 1400      Weekly Session   Topic Discussed Eating for the season   Circuit taught   Minutes exercised this week 250 minutes    Classes attended to date 63          Grateful for: everything and everyone, my weight loss and breathing well  Barriers/struggles: licorice    Barnett Hatter 09/06/2020, 2:32 PM

## 2020-09-07 ENCOUNTER — Ambulatory Visit
Admission: RE | Admit: 2020-09-07 | Discharge: 2020-09-07 | Disposition: A | Discharge status: Home / Self Care | Payer: Medicare Other | Source: Ambulatory Visit | Attending: Obstetrics & Gynecology | Admitting: Obstetrics & Gynecology

## 2020-09-07 ENCOUNTER — Other Ambulatory Visit: Payer: Self-pay

## 2020-09-07 ENCOUNTER — Ambulatory Visit: Payer: Medicare Other

## 2020-09-07 DIAGNOSIS — Z1231 Encounter for screening mammogram for malignant neoplasm of breast: Secondary | ICD-10-CM | POA: Diagnosis not present

## 2020-09-13 NOTE — Progress Notes (Signed)
[facility] PREP Weekly Session   Patient Details  Name: Amanda Wilkins MRN: [id number] Date of Birth: Mar 31, 1946 Age: 74 y.o. PCP: [name], MD  Vitals:   09/13/20 1422  Weight: 233 lb (105.7 kg)     [facility] Weekly seesion - 09/13/20 1400      Weekly Session   Topic Discussed Hitting roadblocks    Minutes exercised this week 300 minutes    Classes attended to date 87          Fun things since last meeting: gingerbread house and games with the kids Grateful for: people who have become more nature lovers since covid!    Barnett Hatter 09/13/2020, 2:23 PM

## 2020-09-20 NOTE — Progress Notes (Signed)
[facility] PREP Weekly Session   Patient Details  Name: Amanda Wilkins MRN: [id number] Date of Birth: Oct 11, 1946 Age: 74 y.o. PCP: [name], MD  Vitals:   09/20/20 1459  Weight: 233 lb (105.7 kg)     [facility] Weekly seesion - 09/20/20 1500      Weekly Session   Topic Discussed --   setting goals for next 90 days    Minutes exercised this week 420 minutes    Classes attended to date 25          Fun things since last meeting: drove to see the balls in the trees Grateful for: feeling better every day, my family Nutrition celebration: no candy Barriers/struggles: wine   Barnett Hatter 09/20/2020, 3:00 PM

## 2020-09-23 ENCOUNTER — Other Ambulatory Visit: Payer: Self-pay | Admitting: Internal Medicine

## 2020-09-27 ENCOUNTER — Ambulatory Visit: Payer: Medicare Other | Admitting: Internal Medicine

## 2020-09-27 NOTE — Progress Notes (Signed)
[facility] PREP Weekly Session   Patient Details  Name: Amanda Wilkins MRN: [id number] Date of Birth: [birth year] Age: 74 y.o. PCP: [name], MD  Vitals:   09/27/20 1515  Weight: 232 lb 9.6 oz (105.5 kg)     [facility] Weekly seesion - 09/27/20 1500      Weekly Session   Topic Discussed --   final fit testing and survey   Minutes exercised this week 360 minutes    Classes attended to date 42          Fun things since last meeting: decorating, play pool Grateful for: everything Nutrition celebration: licorice only once in a week    Barnett Hatter 09/27/2020, 3:16 PM

## 2020-09-28 ENCOUNTER — Telehealth (INDEPENDENT_AMBULATORY_CARE_PROVIDER_SITE_OTHER): Payer: Medicare Other | Admitting: Internal Medicine

## 2020-09-28 ENCOUNTER — Encounter: Payer: Self-pay | Admitting: Internal Medicine

## 2020-09-28 DIAGNOSIS — J0100 Acute maxillary sinusitis, unspecified: Secondary | ICD-10-CM | POA: Diagnosis not present

## 2020-09-28 DIAGNOSIS — J329 Chronic sinusitis, unspecified: Secondary | ICD-10-CM | POA: Insufficient documentation

## 2020-09-28 NOTE — Assessment & Plan Note (Signed)
Acute Likely viral in nature Discussed symptomatic treatment-Tylenol, Mucinex, saline nasal spray, Flonase She will try these first for couple more days and let me know if her symptoms are not improving Continue increased fluids and rest

## 2020-09-28 NOTE — Progress Notes (Signed)
Virtual Visit via Video Note  I connected with Amanda Wilkins on 09/28/20 at  1:45 PM EST by a video enabled telemedicine application and verified that I am speaking with the correct person using two identifiers.   I discussed the limitations of evaluation and management by telemedicine and the availability of in person appointments. The patient expressed understanding and agreed to proceed.  Present for the visit:  Myself, Dr Billey Gosling, [name].  The patient is currently at home and I am in the office.    No referring provider.    History of Present Illness: She is here for an acute visit for cold symptoms.   Her symptoms started 4-5 days  She is experiencing cough, mouth feels inflamed/roof of mouth and gums feel burned, lots of PND, nasal congestion.  Cough is occasionally productive.  She denies any shortness of breath, wheezing, body aches and headaches.    She has tried taking  tylenol  No h/o sinus infections   Review of Systems  Constitutional: Negative for fever.  HENT: Positive for congestion. Negative for ear pain, sinus pain and sore throat (burning from PND).        PND  Respiratory: Positive for cough (occ procductive). Negative for shortness of breath and wheezing.   Gastrointestinal: Negative for diarrhea and nausea.  Musculoskeletal: Negative for myalgias.  Neurological: Negative for headaches.     Social History   Socioeconomic History  . Marital status: Married    Spouse name: Not on file  . Number of children: 2  . Years of education: Not on file  . Highest education level: Not on file  Occupational History  . Occupation: retired    Fish farm manager: UNEMPLOYED  . Occupation: Part-time (with temp agency)  Tobacco Use  . Smoking status: Never Smoker  . Smokeless tobacco: Never Used  . Tobacco comment: H/O social smoking at times when drinking--never a "habit" (in the 1970s)  Vaping Use  . Vaping Use: Never used  Substance and Sexual  Activity  . Alcohol use: Yes    Alcohol/week: 0.0 standard drinks    Comment: Occasional  . Drug use: Never  . Sexual activity: Yes    Partners: Male    Birth control/protection: Post-menopausal    Comment: intercourse age 73, sexual partners more than 5  Other Topics Concern  . Not on file  Social History Narrative  . Not on file   Social Determinants of Health   Financial Resource Strain: Low Risk   . Difficulty of Paying Living Expenses: Not hard at all  Food Insecurity: No Food Insecurity  . Worried About Charity fundraiser in the Last Year: Never true  . Ran Out of Food in the Last Year: Never true  Transportation Needs: No Transportation Needs  . Lack of Transportation (Medical): No  . Lack of Transportation (Non-Medical): No  Physical Activity: Sufficiently Active  . Days of Exercise per Week: 5 days  . Minutes of Exercise per Session: 60 min  Stress: No Stress Concern Present  . Feeling of Stress : Not at all  Social Connections: Socially Integrated  . Frequency of Communication with Friends and Family: More than three times a week  . Frequency of Social Gatherings with Friends and Family: More than three times a week  . Attends Religious Services: More than 4 times per year  . Active Member of Clubs or Organizations: Yes  . Attends Archivist Meetings: More than 4 times per year  .  Marital Status: Married     Observations/Objective: Appears well in NAD Breathing normally Skin appears warm and dry  Assessment and Plan:  See Problem List for Assessment and Plan of chronic medical problems.   Follow Up Instructions:    I discussed the assessment and treatment plan with the patient. The patient was provided an opportunity to ask questions and all were answered. The patient agreed with the plan and demonstrated an understanding of the instructions.   The patient was advised to call back or seek an in-person evaluation if the symptoms worsen or if  the condition fails to improve as anticipated.    Binnie Rail, MD

## 2020-09-29 NOTE — Progress Notes (Signed)
[place] Report   Patient Details  Name: Amanda Wilkins MRN: [id number] Date of Birth: January 02, 1946 Age: 74 y.o. PCP: [name], MD  Vitals:   09/29/20 1519  BP: 138/88  Pulse: 83  SpO2: 98%  Weight: 232 lb 9.6 oz (105.5 kg)      [facility] Eval - 09/29/20 1500      Referral    Program Start Date --   Final class day 09/29/20     Measurement   Waist Circumference 44.5 inches    Hip Circumference 49 inches    Body fat 49.2 percent      Mobility and Daily Activities   I find it easy to walk up or down two or more flights of stairs. 1   knee is the limitation   I have no trouble taking out the trash. 4    I do housework such as vacuuming and dusting on my own without difficulty. 4    I can easily lift a gallon of milk (8lbs). 4    I can easily walk a mile. 2    I have no trouble reaching into high cupboards or reaching down to pick up something from the floor. 3    I do not have trouble doing out-door work such as Armed forces logistics/support/administrative officer, raking leaves, or gardening. 1      Mobility and Daily Activities   I feel younger than my age. 4    I feel independent. 4    I feel energetic. 3    I live an active life.  4    I feel strong. 4    I feel healthy. 4    I feel active as other people my age. 3      How fit and strong are you.   Fit and Strong Total Score 45          Past Medical History:  Diagnosis Date  . Anticoagulant long-term use    Xarelto  . Chronic interstitial nephritis   . Depression   . History of interstitial nephritis 2016   chronic  . History of nuclear stress test 12/16/2014   Intermediate risk nuclear study w/ medium size moderate severity reversible defect in the basal and mid inferolateral and inferior wall (per dr cardiology note , dr [name] did not think this was consistent with ischemia)/  normal LV function and wall motion, ef 75%  . History of septic shock 01/22/2015   in setting Group A Strep Cellulits  erysipelas/chest wall induration with streptoccocus basterium-- Severe sepsis, DIC, Acute respiratory failure with pulmonary edema, Acute Kidney failure with chronic interstitial nephritis  . Hypertension   . Hyponatremia   . Migraines    on Zoloft for migraines  . Mild carotid artery disease ([place])    per duplex 08-30-2017 bilateral ICA 1-39%  . OA (osteoarthritis) rheumotologist-  dr [name]   both knees,  shoulders, ankles  . OSA on CPAP     moderate obstructive sleep apnea with an AHI of 23.4/h and oxygen desaturations as low as 84%.  Now on CPAP at 12 cm H2O.  [name] PAF (paroxysmal atrial fibrillation) [facility]) 2009   cardiologist-- dr [name]  . Paroxysmal atrial flutter ([place])    a. dx 11/2017.  [name] PSVT (paroxysmal supraventricular tachycardia) ([place])   . Wears contact lenses    Past Surgical History:  Procedure Laterality Date  . BREAST BIOPSY Right 05/15/2017   PASH  . CESAREAN  SECTION  1980  . ESOPHAGOGASTRODUODENOSCOPY N/A 02/08/2015   Procedure: ESOPHAGOGASTRODUODENOSCOPY (EGD);  Surgeon: [name], MD;  Location: [place];  Service: Endoscopy;  Laterality: N/A;  . KNEE ARTHROSCOPY W/ MENISCAL REPAIR Left 08/2014    @[facility]   . SHOULDER SURGERY Right 04/04/2016  . TOTAL KNEE ARTHROPLASTY Right 09/01/2018   Procedure: RIGHT TOTAL KNEE ARTHROPLASTY;  Surgeon: [name], MD;  Location: [facility];  Service: Orthopedics;  Laterality: Right;   Social History   Tobacco Use  Smoking Status Never Smoker  Smokeless Tobacco Never Used  Tobacco Comment   H/O social smoking at times when drinking--never a "habit" (in the 1970s)     Barnett Hatter 09/29/2020, 3:22 PM

## 2020-10-18 ENCOUNTER — Encounter: Payer: Self-pay | Admitting: Internal Medicine

## 2020-10-18 DIAGNOSIS — R059 Cough, unspecified: Secondary | ICD-10-CM

## 2020-10-20 ENCOUNTER — Ambulatory Visit (INDEPENDENT_AMBULATORY_CARE_PROVIDER_SITE_OTHER): Payer: Medicare Other

## 2020-10-20 DIAGNOSIS — R059 Cough, unspecified: Secondary | ICD-10-CM | POA: Diagnosis not present

## 2020-10-20 DIAGNOSIS — I4891 Unspecified atrial fibrillation: Secondary | ICD-10-CM | POA: Diagnosis not present

## 2020-10-21 ENCOUNTER — Other Ambulatory Visit: Payer: Medicare Other

## 2020-10-21 ENCOUNTER — Other Ambulatory Visit: Payer: Self-pay

## 2020-10-21 ENCOUNTER — Telehealth: Payer: Self-pay | Admitting: Internal Medicine

## 2020-10-21 DIAGNOSIS — Z20822 Contact with and (suspected) exposure to covid-19: Secondary | ICD-10-CM

## 2020-10-21 DIAGNOSIS — H00025 Hordeolum internum left lower eyelid: Secondary | ICD-10-CM | POA: Diagnosis not present

## 2020-10-21 MED ORDER — DOXYCYCLINE HYCLATE 100 MG PO TABS: 100.0000 mg | ORAL_TABLET | Freq: Two times a day (BID) | ORAL | 0 refills | Status: DC

## 2020-10-21 NOTE — Addendum Note (Signed)
Addended by: Binnie Rail on: 10/21/2020 05:49 AM   Modules accepted: Orders

## 2020-10-21 NOTE — Telephone Encounter (Signed)
I received a call from Philmore Pali, Abriella's optometrist.  She had seen him today and she has a new Hordeolum in her left lower eye.  He would prefer to prescribe her Keflex versus doxycycline which I did prescribe today and wondered if we could change this.  After discussion she will take Keflex 500 mg twice daily x7 days and then doxycycline twice daily x7 days-this should be sufficient to clear up the bronchitis.  Still repeat chest x-ray 4 weeks after completing antibiotic.

## 2020-10-24 LAB — NOVEL CORONAVIRUS, NAA: SARS-CoV-2, NAA: NOT DETECTED

## 2020-10-25 ENCOUNTER — Encounter: Payer: Self-pay | Admitting: Internal Medicine

## 2020-10-25 NOTE — Telephone Encounter (Signed)
error 

## 2020-10-26 ENCOUNTER — Encounter: Payer: Self-pay | Admitting: Internal Medicine

## 2020-10-26 DIAGNOSIS — H00025 Hordeolum internum left lower eyelid: Secondary | ICD-10-CM | POA: Diagnosis not present

## 2020-10-26 DIAGNOSIS — H02845 Edema of left lower eyelid: Secondary | ICD-10-CM | POA: Diagnosis not present
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
## 2020-10-27 ENCOUNTER — Other Ambulatory Visit: Payer: Self-pay

## 2020-10-27 ENCOUNTER — Telehealth: Payer: Self-pay | Admitting: Internal Medicine

## 2020-10-27 ENCOUNTER — Encounter: Payer: Self-pay | Admitting: Internal Medicine

## 2020-10-27 ENCOUNTER — Ambulatory Visit (INDEPENDENT_AMBULATORY_CARE_PROVIDER_SITE_OTHER): Payer: Medicare Other | Admitting: Internal Medicine

## 2020-10-27 VITALS — BP 130/70 | HR 72 | Temp 98.0°F | Ht 64.0 in | Wt 238.0 lb

## 2020-10-27 DIAGNOSIS — J40 Bronchitis, not specified as acute or chronic: Secondary | ICD-10-CM

## 2020-10-27 DIAGNOSIS — H00025 Hordeolum internum left lower eyelid: Secondary | ICD-10-CM | POA: Diagnosis not present

## 2020-10-27 MED ORDER — METHYLPREDNISOLONE 4 MG PO TBPK: ORAL_TABLET | ORAL | 0 refills | Status: DC

## 2020-10-27 NOTE — Telephone Encounter (Signed)
Take both  Continue antibiotic - finishes the one antibiotic tomorrow and then start the doxycycline.   Ok to take steroid with antibiotic

## 2020-10-27 NOTE — Telephone Encounter (Signed)
Patient was just seen today, She is wondering if she should still continue the antibiotic. She was prescribed a steroid today and if she wants her to take both, how should she take them. She currently takes the antibiotic once in the morning and once at night.  [phone]

## 2020-10-27 NOTE — Patient Instructions (Addendum)
Continue and complete the antibiotic.  Take the steroid as prescribed.    Have the chest xray a few weeks after you have finished the antibiotics.

## 2020-10-27 NOTE — Telephone Encounter (Signed)
Patient sent my-chart today with Dr. Eilleen Kempf response.

## 2020-10-31 ENCOUNTER — Ambulatory Visit: Payer: Medicare Other | Admitting: Internal Medicine

## 2020-11-03 NOTE — Progress Notes (Signed)
Cardiology Office Note   Date:  11/04/2020   ID:  Amanda Wilkins, DOB Mar 10, 1946, MRN [id number]  PCP:  [name], MD  Cardiologist:   [name], MD    F/u of dyspnea     History of Present Illness: Amanda Wilkins is a 75 y.o. female with a history of chest pain, near syncope  and PAF  Echo in 2016 showed normal LVEF Carotid USN showed mild plaquing   Holter showed intermitt afib/SVT   Recomm flecanide; pt declinied  Myovue showed inferolateral and inferior defect consistent with ischemia   In Feb 201o she had atrial flutter   Reverted to SR on own  After that she had intermitt heart pounding   Recommendation was to take an extra metoprolol as needed   Calcium score CT in 2020  Was 0   PFTs order for dyspnea  They were normal The pt saw B Bhagat in Feb 2021  She continued to note DOE esp with hills/bike  BMET and BNP done  BNP was a little elevated at 433  Lasix ws recommended 3x per wk    A repeat echo ordered   This showed normal LVEF and RVEF   There was mild MR noted   RVSP estimated at 47 mm Hg    I saw the pt in Aug 2021   She had just had CPX   Limitation felt to be due to pt's habitus/weight  Recmmm exercise training   The pt enrolled in [facility] program    Had significant improvement in her exercise tolerance   Able to use ellippitcal for 30 min     Unfortunately, since early Jan she has had to stop exercising   Developed upper resp infection (an eye infection).   Has now been on 2 rounds of ABX and prednisone    Feels tired  No fevers      Current Meds  Medication Sig  . acetaminophen (TYLENOL) 500 MG tablet Take 1,000 mg by mouth every 8 (eight) hours as needed (pain).  [name] atenolol (TENORMIN) 50 MG tablet Take 50 mg by mouth daily.  . cetirizine (ZYRTEC ALLERGY) 10 MG tablet Take 1 tablet (10 mg total) by mouth daily.  . cholecalciferol (VITAMIN D3) 25 MCG (1000 UT) tablet Take 1,000 Units by mouth daily.  [name] EPINEPHrine 0.3 mg/0.3 mL IJ SOAJ injection  Inject 0.3 mg into the muscle as needed for anaphylaxis. As needed for life-threatening allergic reactions  . famotidine (PEPCID) 40 MG tablet Take 1 tablet (40 mg total) by mouth daily.  . hydroxychloroquine (PLAQUENIL) 200 MG tablet Take 200 mg by mouth every morning.   . Multiple Vitamin (MULTIVITAMIN) capsule Take 1 capsule by mouth daily.  . rosuvastatin (CRESTOR) 5 MG tablet Take 1 tablet (5 mg total) by mouth daily. Keep follow-up appt with provider for future refills  . sertraline (ZOLOFT) 50 MG tablet Take 0.5 tablets (25 mg total) by mouth daily.  Alveda Reasons 20 MG TABS tablet TAKE 1 TABLET BY MOUTH DAILY WITH SUPPER     Allergies:   Epinephrine base, Aspirin, Benadryl [diphenhydramine], Hylan g-f 20, Penicillins, and Tape   Past Medical History:  Diagnosis Date  . Anticoagulant long-term use    Xarelto  . Chronic interstitial nephritis   . Depression   . History of interstitial nephritis 2016   chronic  . History of nuclear stress test 12/16/2014   Intermediate risk nuclear study w/ medium size moderate severity reversible defect in the basal  and mid inferolateral and inferior wall (per dr cardiology note , dr [name] did not think this was consistent with ischemia)/  normal LV function and wall motion, ef 75%  . History of septic shock 01/22/2015   in setting Group A Strep Cellulits erysipelas/chest wall induration with streptoccocus basterium-- Severe sepsis, DIC, Acute respiratory failure with pulmonary edema, Acute Kidney failure with chronic interstitial nephritis  . Hypertension   . Hyponatremia   . Migraines    on Zoloft for migraines  . Mild carotid artery disease ([place])    per duplex 08-30-2017 bilateral ICA 1-39%  . OA (osteoarthritis) rheumotologist-  dr [name]   both knees,  shoulders, ankles  . OSA on CPAP     moderate obstructive sleep apnea with an AHI of 23.4/h and oxygen desaturations as low as 84%.  Now on CPAP at 12 cm H2O.  [name] PAF (paroxysmal  atrial fibrillation) [facility]) 2009   cardiologist-- dr [name]  . Paroxysmal atrial flutter ([place])    a. dx 11/2017.  [name] PSVT (paroxysmal supraventricular tachycardia) ([place])   . Wears contact lenses     Past Surgical History:  Procedure Laterality Date  . BREAST BIOPSY Right 05/15/2017   [facility]  . CESAREAN SECTION  1980  . ESOPHAGOGASTRODUODENOSCOPY N/A 02/08/2015   Procedure: ESOPHAGOGASTRODUODENOSCOPY ([facility]);  Surgeon: [name], MD;  Location: [facility];  Service: Endoscopy;  Laterality: N/A;  . KNEE ARTHROSCOPY W/ MENISCAL REPAIR Left 08/2014    @[facility]   . SHOULDER SURGERY Right 04/04/2016  . TOTAL KNEE ARTHROPLASTY Right 09/01/2018   Procedure: RIGHT TOTAL KNEE ARTHROPLASTY;  Surgeon: [name], MD;  [facility];  Service: Orthopedics;  Laterality: Right;     Social History:  The patient  reports that she has never smoked. She has never used smokeless tobacco. She reports current alcohol use. She reports that she does not use drugs.   Family History:  The patient's family history includes Allergic rhinitis in her sister; Breast cancer (age of onset: 64) in her sister; Lymphoma in her mother; Pancreatic cancer in her brother; Prostate cancer in her father; Urticaria in her sister.    ROS:  Please see the history of present illness. All other systems are reviewed and  Negative to the above problem except as noted.    PHYSICAL EXAM: VS:  BP (!) 148/90   Pulse 66   Ht 5\' 4"  (1.626 m)   Wt 235 lb (106.6 kg)   SpO2 97%   BMI 40.34 kg/m   GEN: Obese 75 yo  in no acute distress  HEENT: normal  Neck: JVP is normal  No carotid bruits Cardiac: RRR; no murmurs,,no LE edema  Respiratory:  clear to auscultation bilaterally, normal work of breathing GI: soft, nontender, nondistended, + BS  No hepatomegaly  MS: no deformity Moving all extremities   Skin: warm and dry Neuro:  Grossly intact   Psych: euthymic mood, full affect   EKG:  EKG is  Not ordered today.   Lipid  Panel    Component Value Date/Time   CHOL 167 07/03/2019 1458   TRIG 72 07/03/2019 1458   HDL 95 07/03/2019 1458   CHOLHDL 1.8 07/03/2019 1458   CHOLHDL 2 05/28/2018 0837   VLDL 13.8 05/28/2018 0837   LDLCALC 58 07/03/2019 1458      Wt Readings from Last 3 Encounters:  11/04/20 235 lb (106.6 kg)  10/27/20 238 lb (108 kg)  09/29/20 232 lb 9.6 oz (105.5 kg)  Echo:   1. Left ventricular ejection fraction, by estimation, is 55 to 60%. The left ventricle has normal function. The left ventricle has no regional wall motion abnormalities. There is mild concentric left ventricular hypertrophy. Left ventricular diastolic parameters were normal. 2. Right ventricular systolic function is normal. The right ventricular size is normal. There is moderately elevated pulmonary artery systolic pressure. 3. The mitral valve is normal in structure and function. Mild mitral valve regurgitation. No evidence of mitral stenosis. 4. The aortic valve is tricuspid. Aortic valve regurgitation is not visualized. No aortic stenosis is present. 5. The inferior vena cava is normal in size with <50% respiratory variability, suggesting right atrial pressure of 8 mmHg. Comparison(s): 01/25/15 EF 55-60%. PA pressure 27mmHg.  ASSESSMENT AND PLAN:  1 Dyspnea. Patient had signficant improvement in sympotms with training/PREP program  Endurance increaased     Unfortunately has had setback with recent infection   Hopefully she will recover and get back to training   2   PAF  Clinically in SR   Rare palpitations   Continue current regimen with Xarelto  2 Hx diastolic dysfunction. Volume status appears OK    3 Hx CP     Denies symptoms   Ca score is 0   Pt has mild plaquing of aorta  Continue Crestor   2  HTN  Continue to follow BP   BP is up today but I would not switch meds for now.    3  CV dz Mild plaquing    4  HL  Keep on Crestor given atherosclerosis of aorta    5  MR   Mild on echo   F/U in 6 months        Current medicines are reviewed at length with the patient today.  The patient does not have concerns regarding medicines.  Signed, [name], MD  11/04/2020 11:44 AM    [address], [place], [place]  [address] Phone: [phone]; Fax: [phone]

## 2020-11-04 ENCOUNTER — Other Ambulatory Visit: Payer: Self-pay

## 2020-11-04 ENCOUNTER — Encounter: Payer: Self-pay | Admitting: Internal Medicine

## 2020-11-04 ENCOUNTER — Ambulatory Visit (INDEPENDENT_AMBULATORY_CARE_PROVIDER_SITE_OTHER): Payer: Medicare Other | Admitting: Internal Medicine

## 2020-11-04 VITALS — BP 148/90 | HR 66 | Ht 64.0 in | Wt 235.0 lb

## 2020-11-04 DIAGNOSIS — I480 Paroxysmal atrial fibrillation: Secondary | ICD-10-CM | POA: Diagnosis not present

## 2020-11-04 DIAGNOSIS — E782 Mixed hyperlipidemia: Secondary | ICD-10-CM | POA: Diagnosis not present

## 2020-11-04 DIAGNOSIS — I10 Essential (primary) hypertension: Secondary | ICD-10-CM | POA: Diagnosis not present

## 2020-11-04 NOTE — Patient Instructions (Addendum)
Medication Instructions:  No changes today *If you need a refill on your cardiac medications before your next appointment, please call your pharmacy*   Lab Work: Today: BMET, CBC, TSH, LIPIDS  If you have labs (blood work) drawn today and your tests are completely normal, you will receive your results only by:  [place] (if you have MyChart) OR  A paper copy in the mail If you have any lab test that is abnormal or we need to change your treatment, we will call you to review the results.   Testing/Procedures: NONE   Follow-Up: At [facility], you and your health needs are our priority.  As part of our continuing mission to provide you with exceptional heart care, we have created designated Provider Care Teams.  These Care Teams include your primary Cardiologist (physician) and Advanced Practice Providers (APPs -  Physician Assistants and Nurse Practitioners) who all work together to provide you with the care you need, when you need it.   Your next appointment:   4-6 week(s)  The format for your next appointment:   In Person  Provider:   [facility] PharmD  Other Instructions You have been referred to [facility] for blood pressure.

## 2020-11-05 ENCOUNTER — Other Ambulatory Visit: Payer: Self-pay | Admitting: Internal Medicine

## 2020-11-05 LAB — CBC
Hematocrit: 41.8 % (ref 34.0–46.6)
Hemoglobin: 14.7 g/dL (ref 11.1–15.9)
MCH: 33.3 pg — ABNORMAL HIGH (ref 26.6–33.0)
MCHC: 35.2 g/dL (ref 31.5–35.7)
MCV: 95 fL (ref 79–97)
Platelets: 223 10*3/uL (ref 150–450)
RBC: 4.41 x10E6/uL (ref 3.77–5.28)
RDW: 11.9 % (ref 11.7–15.4)
WBC: 8.1 10*3/uL (ref 3.4–10.8)

## 2020-11-05 LAB — BASIC METABOLIC PANEL
BUN/Creatinine Ratio: 26 (ref 12–28)
BUN: 23 mg/dL (ref 8–27)
CO2: 25 mmol/L (ref 20–29)
Calcium: 9.8 mg/dL (ref 8.7–10.3)
Chloride: 96 mmol/L (ref 96–106)
Creatinine, Ser: 0.88 mg/dL (ref 0.57–1.00)
GFR calc Af Amer: 75 mL/min/{1.73_m2} (ref >59)
GFR calc non Af Amer: 65 mL/min/{1.73_m2} (ref >59)
Glucose: 85 mg/dL (ref 65–99)
Potassium: 5.2 mmol/L (ref 3.5–5.2)
Sodium: 132 mmol/L — ABNORMAL LOW (ref 134–144)

## 2020-11-05 LAB — LIPID PANEL
Chol/HDL Ratio: 1.7 ratio (ref 0.0–4.4)
Cholesterol, Total: 175 mg/dL (ref 100–199)
HDL: 102 mg/dL (ref >39)
LDL Chol Calc (NIH): 60 mg/dL (ref 0–99)
Triglycerides: 69 mg/dL (ref 0–149)
VLDL Cholesterol Cal: 13 mg/dL (ref 5–40)

## 2020-11-05 LAB — TSH: TSH: 3.14 u[IU]/mL (ref 0.450–4.500)

## 2020-11-07 ENCOUNTER — Encounter: Payer: Self-pay | Admitting: Internal Medicine

## 2020-11-07 ENCOUNTER — Ambulatory Visit: Payer: Medicare Other | Admitting: Internal Medicine

## 2020-11-07 ENCOUNTER — Other Ambulatory Visit: Payer: Self-pay

## 2020-11-07 DIAGNOSIS — I4819 Other persistent atrial fibrillation: Secondary | ICD-10-CM | POA: Diagnosis not present

## 2020-11-07 DIAGNOSIS — I10 Essential (primary) hypertension: Secondary | ICD-10-CM | POA: Diagnosis not present

## 2020-11-07 DIAGNOSIS — H579 Unspecified disorder of eye and adnexa: Secondary | ICD-10-CM | POA: Diagnosis not present

## 2020-11-07 NOTE — Assessment & Plan Note (Addendum)
Cont w/Atenolol

## 2020-11-07 NOTE — Progress Notes (Signed)
Subjective:  Patient ID: Amanda Wilkins, female    DOB: 06-Jan-1946  Age: 75 y.o. MRN: [id number]  CC: Hypertension (Here for follow up)   HPI Amanda Wilkins presents for URI  4 wks ago - then eye inflammation in the L eye, she took Prednisone and saw an ophthalmologist. She was in Prep program at the [facility] F/u HTN, PAF  Outpatient Medications Prior to Visit  Medication Sig Dispense Refill  . acetaminophen (TYLENOL) 500 MG tablet Take 1,000 mg by mouth every 8 (eight) hours as needed (pain).    [name] atenolol (TENORMIN) 50 MG tablet Take 50 mg by mouth daily.    . cholecalciferol (VITAMIN D3) 25 MCG (1000 UT) tablet Take 1,000 Units by mouth daily.    [name] EPINEPHrine 0.3 mg/0.3 mL IJ SOAJ injection Inject 0.3 mg into the muscle as needed for anaphylaxis. As needed for life-threatening allergic reactions 2 each 1  . famotidine (PEPCID) 40 MG tablet Take 1 tablet (40 mg total) by mouth daily. 90 tablet 3  . hydroxychloroquine (PLAQUENIL) 200 MG tablet Take 200 mg by mouth every morning.     . Multiple Vitamin (MULTIVITAMIN) capsule Take 1 capsule by mouth daily.    . rosuvastatin (CRESTOR) 5 MG tablet Take 1 tablet (5 mg total) by mouth daily. Keep follow-up appt with provider for future refills 90 tablet 0  . sertraline (ZOLOFT) 50 MG tablet Take 0.5 tablets (25 mg total) by mouth daily. 90 tablet 3  . XARELTO 20 MG TABS tablet TAKE 1 TABLET BY MOUTH DAILY WITH SUPPER 90 tablet 3  . cetirizine (ZYRTEC ALLERGY) 10 MG tablet Take 1 tablet (10 mg total) by mouth daily. 90 tablet 3   No facility-administered medications prior to visit.    ROS: Review of Systems  Constitutional: Negative for activity change, appetite change, chills, fatigue and unexpected weight change.  HENT: Negative for congestion, mouth sores and sinus pressure.   Eyes: Negative for visual disturbance.  Respiratory: Negative for cough and chest tightness.   Gastrointestinal: Negative for abdominal pain and nausea.   Genitourinary: Negative for difficulty urinating, frequency and vaginal pain.  Musculoskeletal: Negative for back pain and gait problem.  Skin: Negative for pallor and rash.  Neurological: Negative for dizziness, tremors, weakness, numbness and headaches.  Psychiatric/Behavioral: Negative for confusion and sleep disturbance.    Objective:  BP 130/74 (BP Location: Left Arm, Patient Position: Sitting, Cuff Size: Large)   Pulse 74   Temp 98 F (36.7 C) (Oral)   Resp 16   Ht 5\' 4"  (1.626 m)   Wt 239 lb (108.4 kg)   SpO2 98%   BMI 41.02 kg/m   BP Readings from Last 3 Encounters:  11/07/20 130/74  11/04/20 (!) 148/90  10/27/20 130/70    Wt Readings from Last 3 Encounters:  11/07/20 239 lb (108.4 kg)  11/04/20 235 lb (106.6 kg)  10/27/20 238 lb (108 kg)    Physical Exam Constitutional:      General: She is not in acute distress.    Appearance: She is well-developed.  HENT:     Head: Normocephalic.     Right Ear: External ear normal.     Left Ear: External ear normal.     Nose: Nose normal.     Mouth/Throat:     Mouth: Oropharynx is clear and moist.  Eyes:     General:        Right eye: No discharge.        Left eye:  No discharge.     Conjunctiva/sclera: Conjunctivae normal.     Pupils: Pupils are equal, round, and reactive to light.  Neck:     Thyroid: No thyromegaly.     Vascular: No JVD.     Trachea: No tracheal deviation.  Cardiovascular:     Rate and Rhythm: Normal rate and regular rhythm.     Heart sounds: Normal heart sounds.  Pulmonary:     Effort: No respiratory distress.     Breath sounds: No stridor. No wheezing.  Abdominal:     General: Bowel sounds are normal. There is no distension.     Palpations: Abdomen is soft. There is no mass.     Tenderness: There is no abdominal tenderness. There is no guarding or rebound.  Musculoskeletal:        General: No tenderness or edema.     Cervical back: Normal range of motion and neck supple.   Lymphadenopathy:     Cervical: No cervical adenopathy.  Skin:    Findings: No erythema or rash.  Neurological:     Mental Status: She is oriented to person, place, and time.     Cranial Nerves: No cranial nerve deficit.     Motor: No abnormal muscle tone.     Coordination: Coordination normal.     Deep Tendon Reflexes: Reflexes normal.  Psychiatric:        Mood and Affect: Mood and affect normal.        Behavior: Behavior normal.        Thought Content: Thought content normal.        Judgment: Judgment normal.     Lab Results  Component Value Date   WBC 8.1 11/04/2020   HGB 14.7 11/04/2020   HCT 41.8 11/04/2020   PLT 223 11/04/2020   GLUCOSE 85 11/04/2020   CHOL 175 11/04/2020   TRIG 69 11/04/2020   HDL 102 11/04/2020   LDLCALC 60 11/04/2020   ALT 19 05/19/2020   AST 23 05/19/2020   NA 132 (L) 11/04/2020   K 5.2 11/04/2020   CL 96 11/04/2020   CREATININE 0.88 11/04/2020   BUN 23 11/04/2020   CO2 25 11/04/2020   TSH 3.140 11/04/2020   INR 1.6 03/05/2016   HGBA1C 5.8 (H) 01/30/2015    MM 3D SCREEN BREAST BILATERAL  Result Date: 09/11/2020 CLINICAL DATA:  Screening. EXAM: DIGITAL SCREENING BILATERAL MAMMOGRAM WITH TOMO AND CAD COMPARISON:  Previous exam(s). ACR Breast Density Category b: There are scattered areas of fibroglandular density. FINDINGS: There are no findings suspicious for malignancy. Images were processed with CAD. IMPRESSION: No mammographic evidence of malignancy. A result letter of this screening mammogram will be mailed directly to the patient. RECOMMENDATION: Screening mammogram in one year. (Code:SM-B-01Y) BI-RADS CATEGORY  1: Negative. Electronically Signed   By: Lajean Manes M.D.   On: 09/11/2020 14:55    Assessment & Plan:    [name], MD

## 2020-11-07 NOTE — Assessment & Plan Note (Addendum)
L eye viral inflammation - resolved on oral steroids. S/p Opth evaluation CBC, CMET, TSH reviewed

## 2020-11-07 NOTE — Assessment & Plan Note (Addendum)
She was in Prep program at the [facility] w/Xarelto, Atenolol

## 2020-11-09 ENCOUNTER — Telehealth: Payer: Self-pay | Admitting: Internal Medicine

## 2020-11-09 NOTE — Telephone Encounter (Signed)
I reviewed visit with A Plotnikov    BP 130/70    I would recomm d/c appt to HTN clinc    Keep track of BP at home   I am not eager to make changes unless high Email/call in with more readings  In about 3 wks

## 2020-11-11 ENCOUNTER — Other Ambulatory Visit: Payer: Self-pay

## 2020-11-11 ENCOUNTER — Encounter: Payer: Self-pay | Admitting: Nurse Practitioner

## 2020-11-11 ENCOUNTER — Ambulatory Visit (INDEPENDENT_AMBULATORY_CARE_PROVIDER_SITE_OTHER): Payer: Medicare Other | Admitting: Nurse Practitioner

## 2020-11-11 VITALS — BP 162/88 | Temp 98.1°F | Ht 65.0 in | Wt 241.6 lb

## 2020-11-11 DIAGNOSIS — R102 Pelvic and perineal pain: Secondary | ICD-10-CM | POA: Diagnosis not present

## 2020-11-11 LAB — WET PREP FOR TRICH, YEAST, CLUE
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
## 2020-11-11 NOTE — Patient Instructions (Signed)
Pelvic Pain, Female Pelvic pain is pain in your lower abdomen, below your belly button and between your hips. The pain may start suddenly (be acute), keep coming back (be recurring), or last a long time (become chronic). Pelvic pain that lasts longer than 6 months is considered chronic. Pelvic pain may affect your:  Reproductive organs.  Urinary system.  Digestive tract.  Musculoskeletal system. There are many potential causes of pelvic pain. Sometimes, the pain can be a result of digestive or urinary conditions, strained muscles or ligaments, or reproductive conditions. Sometimes the cause of pelvic pain is not known. Follow these instructions at home:  Take over-the-counter and prescription medicines only as told by your health care provider.  Rest as told by your health care provider.  Do not have sex if it hurts.  Keep a journal of your pelvic pain. Write down: ? When the pain started. ? Where the pain is located. ? What seems to make the pain better or worse, such as food or your period (menstrual cycle). ? Any symptoms you have along with the pain.  Keep all follow-up visits as told by your health care provider. This is important.   Contact a health care provider if:  Medicine does not help your pain.  Your pain comes back.  You have new symptoms.  You have abnormal vaginal discharge or bleeding, including bleeding after menopause.  You have a fever or chills.  You are constipated.  You have blood in your urine or stool.  You have foul-smelling urine.  You feel weak or light-headed. Get help right away if:  You have sudden severe pain.  Your pain gets steadily worse.  You have severe pain along with fever, nausea, vomiting, or excessive sweating.  You lose consciousness. Summary  Pelvic pain is pain in your lower abdomen, below your belly button and between your hips.  There are many potential causes of pelvic pain.  Keep a journal of your pelvic  pain. This information is not intended to replace advice given to you by your health care provider. Make sure you discuss any questions you have with your health care provider. Document Revised: 03/19/2018 Document Reviewed: 03/19/2018 Elsevier Patient Education  2021 Elsevier Inc.  

## 2020-11-13 LAB — URINALYSIS, COMPLETE W/RFL CULTURE
Bacteria, UA: NONE SEEN /HPF
Bilirubin Urine: NEGATIVE
Glucose, UA: NEGATIVE
Hgb urine dipstick: NEGATIVE
Hyaline Cast: NONE SEEN /LPF
Ketones, ur: NEGATIVE
Nitrites, Initial: NEGATIVE
Protein, ur: NEGATIVE
RBC / HPF: NONE SEEN /HPF (ref 0–2)
Specific Gravity, Urine: 1.015 (ref 1.001–1.03)
pH: 7 (ref 5.0–8.0)

## 2020-11-13 LAB — URINE CULTURE
MICRO NUMBER:: 11469919
SPECIMEN QUALITY:: ADEQUATE

## 2020-11-13 LAB — CULTURE INDICATED

## 2020-11-14 ENCOUNTER — Telehealth: Payer: Self-pay

## 2020-11-14 ENCOUNTER — Other Ambulatory Visit: Payer: Self-pay | Admitting: Nurse Practitioner

## 2020-11-14 DIAGNOSIS — R102 Pelvic and perineal pain: Secondary | ICD-10-CM

## 2020-11-14 NOTE — Telephone Encounter (Signed)
-----   Message from Karma Ganja, NP sent at 11/14/2020  8:18 AM EST ----- Please notify patient that her urine culture did not show any bacteria, meaning there is no evidence of a bladder infection. The patient and I discussed next steps when she was here. If the culture is negative, I believe she should have an ultrasound to see if the source of her pressure/discomfort can be identified. Please call patient and see if she is agreeable to come for ultrasound, if so, I will place order. Thank you.

## 2020-11-14 NOTE — Telephone Encounter (Signed)
Patient was notified that you recommend u/s. She is fine with that. Please place order and forward to [facility] to check prior autho and schedule. THanks

## 2020-11-15 NOTE — Telephone Encounter (Signed)
Spoke with patient and informed.  Appt cancelled.  She will keep track at home and write in [place] in 3 weeks w readings or call sooner if getting high readings.

## 2020-11-15 NOTE — Telephone Encounter (Signed)
Order has been placed. I sent message to [facility] to be sure she was aware.

## 2020-11-21 ENCOUNTER — Other Ambulatory Visit: Payer: Self-pay | Admitting: Internal Medicine

## 2020-11-21 DIAGNOSIS — L300 Nummular dermatitis: Secondary | ICD-10-CM | POA: Diagnosis not present

## 2020-11-21 MED ORDER — ALPRAZOLAM 0.25 MG PO TABS: 0.2500 mg | ORAL_TABLET | Freq: Two times a day (BID) | ORAL | 1 refills | Status: DC | PRN

## 2020-12-02 ENCOUNTER — Ambulatory Visit: Payer: Medicare Other

## 2020-12-02 ENCOUNTER — Ambulatory Visit: Payer: Medicare Other | Admitting: Allergy

## 2020-12-02 DIAGNOSIS — H0011 Chalazion right upper eyelid: Secondary | ICD-10-CM | POA: Diagnosis not present

## 2020-12-15 ENCOUNTER — Other Ambulatory Visit: Payer: Medicare Other | Admitting: Obstetrics and Gynecology

## 2020-12-15 ENCOUNTER — Other Ambulatory Visit: Payer: Medicare Other

## 2020-12-15 ENCOUNTER — Ambulatory Visit: Payer: Medicare Other | Admitting: Internal Medicine

## 2020-12-16 ENCOUNTER — Other Ambulatory Visit: Payer: Self-pay | Admitting: Internal Medicine

## 2020-12-26 DIAGNOSIS — H0011 Chalazion right upper eyelid: Secondary | ICD-10-CM | POA: Diagnosis not present

## 2021-01-02 DIAGNOSIS — E669 Obesity, unspecified: Secondary | ICD-10-CM | POA: Diagnosis not present

## 2021-01-02 DIAGNOSIS — Z79899 Other long term (current) drug therapy: Secondary | ICD-10-CM | POA: Diagnosis not present

## 2021-01-02 DIAGNOSIS — Z6838 Body mass index (BMI) 38.0-38.9, adult: Secondary | ICD-10-CM | POA: Diagnosis not present

## 2021-01-02 DIAGNOSIS — M0609 Rheumatoid arthritis without rheumatoid factor, multiple sites: Secondary | ICD-10-CM | POA: Diagnosis not present

## 2021-01-02 DIAGNOSIS — M2550 Pain in unspecified joint: Secondary | ICD-10-CM | POA: Diagnosis not present

## 2021-01-02 DIAGNOSIS — M150 Primary generalized (osteo)arthritis: Secondary | ICD-10-CM | POA: Diagnosis not present

## 2021-01-12 ENCOUNTER — Other Ambulatory Visit: Payer: Self-pay

## 2021-01-12 ENCOUNTER — Ambulatory Visit (INDEPENDENT_AMBULATORY_CARE_PROVIDER_SITE_OTHER): Payer: Medicare Other

## 2021-01-12 ENCOUNTER — Ambulatory Visit (INDEPENDENT_AMBULATORY_CARE_PROVIDER_SITE_OTHER): Payer: Medicare Other | Admitting: Obstetrics & Gynecology

## 2021-01-12 ENCOUNTER — Other Ambulatory Visit: Payer: Medicare Other

## 2021-01-12 DIAGNOSIS — R102 Pelvic and perineal pain: Secondary | ICD-10-CM

## 2021-01-12 NOTE — Progress Notes (Signed)
    Amanda Wilkins 08-18-1946 [id number]        75 y.o.  G2P2002  Married.  RP: Pelvic pressure/bloatiness for Pelvic US  HPI: Presented at the end of 10/2020 with pelvic pressure and a feeling of abdominal bloatedness which have resolved since then.  No pelvic pain.  No PMB.  No pain with IC.     OB History  Gravida Para Term Preterm AB Living  2 2 2     2   SAB IAB Ectopic Multiple Live Births          2    # Outcome Date GA Lbr Len/2nd Weight Sex Delivery Anes PTL Lv  2 Term     M Vag-Spont  N LIV  1 Term     M CS-Unspec  N LIV    Past medical history,surgical history, problem list, medications, allergies, family history and social history were all reviewed and documented in the EPIC chart.   Directed ROS with pertinent positives and negatives documented in the history of present illness/assessment and plan.  Exam:  There were no vitals filed for this visit. General appearance:  Normal  Pelvic US today: T/V images.  Anteverted uterus atrophic with no myometrial mass.  The uterus is measured at 7.66 x 3.58 x 3.38 cm.  The endometrial lining is symmetrical with no mass or thickening seen, measured at 3.88 mm.  Both ovaries are atrophic with peripheral calcifications.  No adnexal mass.  No free fluid in the posterior cul-de-sac.   Assessment/Plan:  75 y.o. G2P2002   1. Pelvic pressure in female Pelvic pressure resolved x visit at the end of 10/2020.  Pelvic US findings thoroughly reviewed with patient.  Uterus normal with this endometrial line and normal bilateral ovaries with no cyst or mass.  No adnexal mass.  No FF in the pelvic cavity.  Patient reassured.  Princess Bruins MD, 10:41 AM 01/12/2021

## 2021-01-13 ENCOUNTER — Encounter: Payer: Self-pay | Admitting: Obstetrics & Gynecology
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
## 2021-02-03 ENCOUNTER — Encounter: Payer: Self-pay | Admitting: Internal Medicine

## 2021-02-03 ENCOUNTER — Ambulatory Visit (INDEPENDENT_AMBULATORY_CARE_PROVIDER_SITE_OTHER): Payer: Medicare Other | Admitting: Internal Medicine

## 2021-02-03 ENCOUNTER — Other Ambulatory Visit: Payer: Self-pay

## 2021-02-03 VITALS — BP 126/80 | HR 70 | Temp 98.2°F | Ht 65.0 in | Wt 238.0 lb

## 2021-02-03 DIAGNOSIS — J209 Acute bronchitis, unspecified: Secondary | ICD-10-CM

## 2021-02-03 DIAGNOSIS — R2232 Localized swelling, mass and lump, left upper limb: Secondary | ICD-10-CM

## 2021-02-03 MED ORDER — CEPHALEXIN 500 MG PO CAPS: 500.0000 mg | ORAL_CAPSULE | Freq: Three times a day (TID) | ORAL | 0 refills | Status: AC

## 2021-02-03 NOTE — Assessment & Plan Note (Signed)
Acute Left forearm Mildly tender Possibly related to leaning against the sink ?  Mild hematoma or superficial blood clot It is getting smaller No further evaluation or treatment needed-she will just monitor and call with concerns

## 2021-02-03 NOTE — Assessment & Plan Note (Signed)
Acute Symptoms ongoing for 1 month without improvement Start Keflex 500 mg 3 times daily x10 days Over-the-counter cold medications Call if no improvement

## 2021-02-03 NOTE — Patient Instructions (Signed)
Take the antibiotic as prescribed - complete the entire course.    You can take over the counter cold medication, advil and tylenol.  Increase your fluids and rest.    Call if no improvement

## 2021-02-06 ENCOUNTER — Encounter: Payer: Self-pay | Admitting: Gastroenterology

## 2021-02-06 DIAGNOSIS — G4733 Obstructive sleep apnea (adult) (pediatric): Secondary | ICD-10-CM | POA: Diagnosis not present

## 2021-02-06 DIAGNOSIS — R7303 Prediabetes: Secondary | ICD-10-CM | POA: Diagnosis not present

## 2021-02-06 DIAGNOSIS — I10 Essential (primary) hypertension: Secondary | ICD-10-CM | POA: Diagnosis not present

## 2021-02-09 ENCOUNTER — Telehealth: Payer: Self-pay | Admitting: Internal Medicine

## 2021-02-09 NOTE — Telephone Encounter (Signed)
Pt c/o BP issue: STAT if pt c/o blurred vision, one-sided weakness or slurred speech  1. What are your last 5 BP readings? Yesterday 107/55, fluctuated remains low, today 106/65  2. Are you having any other symptoms (ex. Dizziness, headache, blurred vision, passed out)? Lightheaded, yesterday had a headache,   3. What is your BP issue? Patient states her BP has been low and she feels "swimmy headed". She states she has not passed out, but does not feel safe to walk around a store.

## 2021-02-09 NOTE — Telephone Encounter (Signed)
Pt reports low BP since yesterday. Light headed, swimmy-headed. Currently on abx for bronchitis, didn't take today in case this is a possible cause. Pt advised to follow up w/ PCP office on this matter being seen there last week and they prescribed the abx. Aware that this does not seem to be related to Atenolol that she has been on for months and BP have been WNL since then. Advised to call back if PCP feels we need to further address. Patient verbalized understanding and agreeable to plan.

## 2021-03-09 DIAGNOSIS — Z23 Encounter for immunization: Secondary | ICD-10-CM | POA: Diagnosis not present

## 2021-03-10 ENCOUNTER — Telehealth: Payer: Self-pay | Admitting: Internal Medicine

## 2021-03-10 ENCOUNTER — Other Ambulatory Visit: Payer: Self-pay

## 2021-03-10 ENCOUNTER — Encounter (HOSPITAL_BASED_OUTPATIENT_CLINIC_OR_DEPARTMENT_OTHER): Payer: Self-pay

## 2021-03-10 ENCOUNTER — Emergency Department (HOSPITAL_BASED_OUTPATIENT_CLINIC_OR_DEPARTMENT_OTHER)
Admission: EM | Admit: 2021-03-10 | Discharge: 2021-03-10 | Disposition: A | Discharge status: Home / Self Care | Payer: Medicare Other | Attending: Emergency Medicine | Admitting: Emergency Medicine

## 2021-03-10 DIAGNOSIS — I480 Paroxysmal atrial fibrillation: Secondary | ICD-10-CM | POA: Insufficient documentation

## 2021-03-10 DIAGNOSIS — R21 Rash and other nonspecific skin eruption: Secondary | ICD-10-CM | POA: Diagnosis not present

## 2021-03-10 DIAGNOSIS — Z7901 Long term (current) use of anticoagulants: Secondary | ICD-10-CM | POA: Insufficient documentation

## 2021-03-10 DIAGNOSIS — Z79899 Other long term (current) drug therapy: Secondary | ICD-10-CM | POA: Diagnosis not present

## 2021-03-10 DIAGNOSIS — J45909 Unspecified asthma, uncomplicated: Secondary | ICD-10-CM | POA: Insufficient documentation

## 2021-03-10 DIAGNOSIS — T7840XA Allergy, unspecified, initial encounter: Secondary | ICD-10-CM | POA: Diagnosis not present

## 2021-03-10 DIAGNOSIS — I10 Essential (primary) hypertension: Secondary | ICD-10-CM | POA: Diagnosis not present

## 2021-03-10 DIAGNOSIS — Z96651 Presence of right artificial knee joint: Secondary | ICD-10-CM | POA: Diagnosis not present

## 2021-03-10 MED ORDER — DEXAMETHASONE SODIUM PHOSPHATE 4 MG/ML IJ SOLN
4.0000 mg | Freq: Once | INTRAMUSCULAR | Status: AC
  Administered 2021-03-10: 4 mg via INTRAMUSCULAR
  Filled 2021-03-10: qty 1

## 2021-03-10 MED ORDER — PREDNISONE 20 MG PO TABS: ORAL_TABLET | ORAL | 0 refills | Status: DC

## 2021-03-10 MED ORDER — FAMOTIDINE 20 MG PO TABS: 20.0000 mg | ORAL_TABLET | Freq: Two times a day (BID) | ORAL | 0 refills | Status: DC

## 2021-03-10 MED ORDER — LORATADINE 10 MG PO TABS
10.0000 mg | ORAL_TABLET | Freq: Once | ORAL | Status: AC
  Administered 2021-03-10: 10 mg via ORAL
  Filled 2021-03-10: qty 1

## 2021-03-10 MED ORDER — FAMOTIDINE 20 MG PO TABS
20.0000 mg | ORAL_TABLET | Freq: Once | ORAL | Status: AC
  Administered 2021-03-10: 20 mg via ORAL
  Filled 2021-03-10: qty 1

## 2021-03-10 MED ORDER — LORATADINE 10 MG PO TABS: 10.0000 mg | ORAL_TABLET | Freq: Every day | ORAL | 0 refills | Status: DC

## 2021-03-10 NOTE — Telephone Encounter (Signed)
Patient c/o Palpitations:  High priority if patient c/o lightheadedness, shortness of breath, or chest pain  1) How long have you had palpitations/irregular HR/ Afib? Are you having the symptoms now? Since last night; woke up with hives  2) Are you currently experiencing lightheadedness, SOB or CP? No   3) Do you have a history of afib (atrial fibrillation) or irregular heart rhythm? Yes   4) Have you checked your BP or HR? (document readings if available): 198/89 (maybe due to hives) took atenolol (TENORMIN) 50 MG tablet  5) Are you experiencing any other symptoms? No

## 2021-03-10 NOTE — ED Notes (Signed)
When turn on left side to give IM medication patient stated felt heart not regular. Placed on monitor rhythm Sinus without ectopy. MD in room assessed patient. Denies Chest pain.

## 2021-03-10 NOTE — Telephone Encounter (Signed)
Reviewed  Not unexpected given reaction. Keep following    Glad she is feeling some better
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
## 2021-03-10 NOTE — Telephone Encounter (Signed)
Pt calling to report: Yesterday got 2nd covid booster 6pm.  3 am this am woke up w hives.  Went to [facility].   Got prednisone inj and pepcid.  Has improved. No SOB or throat tightening.  Felt a little anxious.  Before she went HR was high and BP was high.  198/89, cant remember HR.  She took an extra atenolol.   Felt at ER but was not on monitor at ER at the time.  When she was on monitor 167/50.   This am 137/72 72. Feeling ok, on prednisone and pepcid and claritin for now.  I scheduled her f/u with Dr. [name] (August) and she is aware I am forwarding to Dr. [name] to let her know.

## 2021-03-10 NOTE — ED Triage Notes (Signed)
Patient here POV from Home with Rash and HTN.  Patient awoke this AM and noticed rash on R/L Flank, Buttock and Lower Extremities.  Patient did check BP in AM and was Hypertensive at 307 systolic. Hx of Same.   No new introductions to possible allergens. Patient did receive COVID-19 Booster yesterday at 1800.  NAD, GCS 15, Ambulatory

## 2021-03-10 NOTE — ED Notes (Signed)
This RN presented the AVS utilizing Teachback Method. Patient verbalizes understanding of Discharge Instructions. Opportunity for Questioning and Answers were provided. Patient Discharged from ED ambulatory to Home with Significant Other.

## 2021-03-10 NOTE — ED Notes (Signed)
A cup of water without ice given to patient per MD request.

## 2021-03-12 ENCOUNTER — Other Ambulatory Visit: Payer: Self-pay

## 2021-03-12 ENCOUNTER — Encounter (HOSPITAL_BASED_OUTPATIENT_CLINIC_OR_DEPARTMENT_OTHER): Payer: Self-pay

## 2021-03-12 DIAGNOSIS — I2510 Atherosclerotic heart disease of native coronary artery without angina pectoris: Secondary | ICD-10-CM | POA: Insufficient documentation

## 2021-03-12 DIAGNOSIS — Z7901 Long term (current) use of anticoagulants: Secondary | ICD-10-CM | POA: Diagnosis not present

## 2021-03-12 DIAGNOSIS — L509 Urticaria, unspecified: Secondary | ICD-10-CM | POA: Insufficient documentation

## 2021-03-12 DIAGNOSIS — J45909 Unspecified asthma, uncomplicated: Secondary | ICD-10-CM | POA: Insufficient documentation

## 2021-03-12 DIAGNOSIS — I480 Paroxysmal atrial fibrillation: Secondary | ICD-10-CM | POA: Insufficient documentation

## 2021-03-12 DIAGNOSIS — Z79899 Other long term (current) drug therapy: Secondary | ICD-10-CM | POA: Insufficient documentation

## 2021-03-12 DIAGNOSIS — Z96651 Presence of right artificial knee joint: Secondary | ICD-10-CM | POA: Diagnosis not present

## 2021-03-12 DIAGNOSIS — I10 Essential (primary) hypertension: Secondary | ICD-10-CM | POA: Insufficient documentation

## 2021-03-12 DIAGNOSIS — R21 Rash and other nonspecific skin eruption: Secondary | ICD-10-CM | POA: Diagnosis present

## 2021-03-12 NOTE — ED Triage Notes (Addendum)
Pt states she was seen for hives on 03/09/21 and has been taking Rx and is present to the ED tonight for new red, raised bumps on arms and chest that she noticed this morning. Pt states they are itchy and different than the hives she was seen for the other day. Denies difficulty breathing.   Pt has not changed detergents or switched up products in the home.

## 2021-03-13 ENCOUNTER — Emergency Department (HOSPITAL_BASED_OUTPATIENT_CLINIC_OR_DEPARTMENT_OTHER)
Admission: EM | Admit: 2021-03-13 | Discharge: 2021-03-13 | Disposition: A | Discharge status: Home / Self Care | Payer: Medicare Other | Attending: Emergency Medicine | Admitting: Emergency Medicine

## 2021-03-13 DIAGNOSIS — L509 Urticaria, unspecified: Secondary | ICD-10-CM | POA: Diagnosis not present

## 2021-03-13 MED ORDER — HYDROXYZINE HCL 25 MG PO TABS
25.0000 mg | ORAL_TABLET | Freq: Once | ORAL | Status: AC
  Administered 2021-03-13: 25 mg via ORAL
  Filled 2021-03-13: qty 1

## 2021-03-13 MED ORDER — LORATADINE 10 MG PO TABS: 10.0000 mg | ORAL_TABLET | Freq: Every day | ORAL | 0 refills | Status: DC

## 2021-03-13 MED ORDER — HYDROXYZINE HCL 25 MG PO TABS: 25.0000 mg | ORAL_TABLET | Freq: Four times a day (QID) | ORAL | 0 refills | Status: DC | PRN

## 2021-03-13 MED ORDER — PREDNISONE 20 MG PO TABS: ORAL_TABLET | ORAL | 0 refills | Status: DC

## 2021-03-13 MED ORDER — FAMOTIDINE 20 MG PO TABS: 20.0000 mg | ORAL_TABLET | Freq: Two times a day (BID) | ORAL | 0 refills | Status: DC

## 2021-03-13 NOTE — ED Notes (Signed)
Pt verbalizes understanding of discharge instructions. Opportunity for questioning and answers were provided. [place] removed by staff, pt discharged from ED to home. Instructed to pick up Rx.  
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
## 2021-03-13 NOTE — Discharge Instructions (Addendum)
You were seen today for recurrent hives.  Continue medications as directed.  I will add Atarax for itching and recurrent rash.  Your prednisone taper will be increased.

## 2021-03-20 ENCOUNTER — Encounter: Payer: Self-pay | Admitting: Internal Medicine

## 2021-03-20 ENCOUNTER — Ambulatory Visit (INDEPENDENT_AMBULATORY_CARE_PROVIDER_SITE_OTHER): Payer: Medicare Other | Admitting: Internal Medicine

## 2021-03-20 ENCOUNTER — Other Ambulatory Visit: Payer: Self-pay

## 2021-03-20 DIAGNOSIS — I4819 Other persistent atrial fibrillation: Secondary | ICD-10-CM | POA: Diagnosis not present

## 2021-03-20 DIAGNOSIS — J399 Disease of upper respiratory tract, unspecified: Secondary | ICD-10-CM | POA: Diagnosis not present

## 2021-03-20 DIAGNOSIS — J45909 Unspecified asthma, uncomplicated: Secondary | ICD-10-CM | POA: Diagnosis not present

## 2021-03-20 MED ORDER — FLUTICASONE-SALMETEROL 100-50 MCG/ACT IN AEPB: 1.0000 | INHALATION_SPRAY | Freq: Two times a day (BID) | RESPIRATORY_TRACT | 3 refills | Status: DC

## 2021-03-20 MED ORDER — CEFDINIR 300 MG PO CAPS: 300.0000 mg | ORAL_CAPSULE | Freq: Two times a day (BID) | ORAL | 0 refills | Status: DC

## 2021-03-20 NOTE — Assessment & Plan Note (Signed)
Worse. Treat URI; claritin qd Advair bid if not better

## 2021-03-20 NOTE — Assessment & Plan Note (Addendum)
Stable On Xarelto, Atenolol

## 2021-03-20 NOTE — Assessment & Plan Note (Addendum)
New Start Omnicef po Advair bid if not better

## 2021-03-20 NOTE — Progress Notes (Signed)
Subjective:  Patient ID: Amanda Wilkins, female    DOB: 1946-04-09  Age: 75 y.o. MRN: [id number]  CC: Hospitalization Follow-up (Pt was in hospital for hives, she is doing better. Pt states she is having some nasal drainage in her throat for about 2 months.)   HPI Amanda Wilkins presents for hives 6 hrs after her COVID 29 2nd booster - resolved C/o throat congestion, drainage and voice loss x 2 weeks  Outpatient Medications Prior to Visit  Medication Sig Dispense Refill  . acetaminophen (TYLENOL) 500 MG tablet Take 1,000 mg by mouth every 8 (eight) hours as needed (pain).    [name] ALPRAZolam (XANAX) 0.25 MG tablet Take 1 tablet (0.25 mg total) by mouth 2 (two) times daily as needed for anxiety. 60 tablet 1  . atenolol (TENORMIN) 50 MG tablet Take 50 mg by mouth daily.    . cholecalciferol (VITAMIN D3) 25 MCG (1000 UT) tablet Take 1,000 Units by mouth daily.    [name] EPINEPHrine 0.3 mg/0.3 mL IJ SOAJ injection Inject 0.3 mg into the muscle as needed for anaphylaxis. As needed for life-threatening allergic reactions 2 each 1  . famotidine (PEPCID) 20 MG tablet Take 1 tablet (20 mg total) by mouth 2 (two) times daily. 30 tablet 0  . hydroxychloroquine (PLAQUENIL) 200 MG tablet Take 200 mg by mouth every morning.     . hydrOXYzine (ATARAX/VISTARIL) 25 MG tablet Take 1 tablet (25 mg total) by mouth every 6 (six) hours as needed. 15 tablet 0  . loratadine (CLARITIN) 10 MG tablet Take 1 tablet (10 mg total) by mouth daily. 30 tablet 0  . Multiple Vitamin (MULTIVITAMIN) capsule Take 1 capsule by mouth daily.    . rosuvastatin (CRESTOR) 5 MG tablet Take 1 tablet (5 mg total) by mouth daily. 90 tablet 3  . sertraline (ZOLOFT) 50 MG tablet Take 0.5 tablets (25 mg total) by mouth daily. 90 tablet 3  . XARELTO 20 MG TABS tablet TAKE 1 TABLET BY MOUTH DAILY WITH SUPPER 90 tablet 3  . predniSONE (DELTASONE) 20 MG tablet 3 tabs po day x 2 days, then 2 po daily x 3 days, then 1 po daily x 3 days, then  0.5 po daily x 4 days (Patient not taking: Reported on 03/20/2021) 17 tablet 0   No facility-administered medications prior to visit.    ROS: Review of Systems  Constitutional: Negative for activity change, appetite change, chills, fatigue and unexpected weight change.  HENT: Negative for congestion, mouth sores and sinus pressure.   Eyes: Negative for visual disturbance.  Respiratory: Negative for cough and chest tightness.   Gastrointestinal: Negative for abdominal pain and nausea.  Genitourinary: Negative for difficulty urinating, frequency and vaginal pain.  Musculoskeletal: Negative for back pain and gait problem.  Skin: Negative for pallor and rash.  Neurological: Negative for dizziness, tremors, weakness, numbness and headaches.  Psychiatric/Behavioral: Negative for confusion and sleep disturbance.    Objective:  BP 122/84   Pulse 76   Ht 5\' 5"  (1.651 m)   Wt 239 lb (108.4 kg)   SpO2 98%   BMI 39.77 kg/m   BP Readings from Last 3 Encounters:  03/20/21 122/84  03/12/21 (!) 156/95  03/10/21 (!) 140/58    Wt Readings from Last 3 Encounters:  03/20/21 239 lb (108.4 kg)  03/12/21 238 lb 1.6 oz (108 kg)  03/10/21 238 lb 1.6 oz (108 kg)    Physical Exam Constitutional:      General: She is not in acute  distress.    Appearance: She is well-developed.  HENT:     Head: Normocephalic.     Right Ear: External ear normal.     Left Ear: External ear normal.     Nose: Nose normal.  Eyes:     General:        Right eye: No discharge.        Left eye: No discharge.     Conjunctiva/sclera: Conjunctivae normal.     Pupils: Pupils are equal, round, and reactive to light.  Neck:     Thyroid: No thyromegaly.     Vascular: No JVD.     Trachea: No tracheal deviation.  Cardiovascular:     Rate and Rhythm: Normal rate and regular rhythm.     Heart sounds: Normal heart sounds.  Pulmonary:     Effort: No respiratory distress.     Breath sounds: No stridor. No wheezing.   Abdominal:     General: Bowel sounds are normal. There is no distension.     Palpations: Abdomen is soft. There is no mass.     Tenderness: There is no abdominal tenderness. There is no guarding or rebound.  Musculoskeletal:        General: No tenderness.     Cervical back: Normal range of motion and neck supple.  Lymphadenopathy:     Cervical: No cervical adenopathy.  Skin:    Findings: No erythema or rash.  Neurological:     Cranial Nerves: No cranial nerve deficit.     Motor: No abnormal muscle tone.     Coordination: Coordination normal.     Deep Tendon Reflexes: Reflexes normal.  Psychiatric:        Behavior: Behavior normal.        Thought Content: Thought content normal.        Judgment: Judgment normal.   pt is coughing a lot in the office  Lab Results  Component Value Date   WBC 8.1 11/04/2020   HGB 14.7 11/04/2020   HCT 41.8 11/04/2020   PLT 223 11/04/2020   GLUCOSE 85 11/04/2020   CHOL 175 11/04/2020   TRIG 69 11/04/2020   HDL 102 11/04/2020   LDLCALC 60 11/04/2020   ALT 19 05/19/2020   AST 23 05/19/2020   NA 132 (L) 11/04/2020   K 5.2 11/04/2020   CL 96 11/04/2020   CREATININE 0.88 11/04/2020   BUN 23 11/04/2020   CO2 25 11/04/2020   TSH 3.140 11/04/2020   INR 1.6 03/05/2016   HGBA1C 5.8 (H) 01/30/2015    No results found.  Assessment & Plan:    [name], MD

## 2021-03-21 ENCOUNTER — Telehealth: Payer: Self-pay | Admitting: Internal Medicine

## 2021-03-21 NOTE — Telephone Encounter (Signed)
PT IS CALLING IN TO SEE IF REQUEST FROM [employer] HAS REACHED DR. ROSS THIS IS FOR TIER CHANGE ON RX Xarelto

## 2021-03-22 NOTE — Telephone Encounter (Signed)
**Note De-Identified Sybilla Malhotra Obfuscation** Issabella Rix fax from [place], we received a Xarelto tier exception form per the pt but the form does not indicate if this is for a PA or a tier exception.  I have completed the form, printed notes to prove need for the pt to take Xarelto, and have e-mailed all to Dr Alan Ripper nurse so she can obtain her signature, date it, and to fax all to [facility] at the fax number written on the cover letter included or to place in the "to be faxed box" in Medical Records to be faxed.  I did call the pt but got no answer so I left a message on her VM (Ok per [facility]) stating that I have completed the [employer] Xarelto form and have e-mailed all to Dr Alan Ripper nurse who will obtain Dr Alan Ripper signature when she returns to the [facility] Friday 6/10 and will fax all to [facility]. I did leave my name and [facility]'s office phone number at [facility] she can call me back if she has any questions.

## 2021-03-26 NOTE — Progress Notes (Signed)
Cardiology Office Note   Date:  03/27/2021   ID:  [name], DOB 1946/02/18, MRN [id number]  PCP:  [name], MD  Cardiologist:   [name], MD    F/u of dyspnea     History of Present Illness: Amanda Wilkins is a 75 y.o. female with a history of chest pain, near syncope  and PAF  Echo in 2016 showed normal LVEF Carotid USN showed mild plaquing   Holter showed intermitt afib/SVT   Recomm flecanide; pt declinied  Myovue showed inferolateral and inferior defect consistent with ischemia   In Feb 201o she had atrial flutter   Reverted to SR on own  After that she had intermitt heart pounding   Recommendation was to take an extra metoprolol as needed   Calcium score CT in 2020 was 0   PFTs order for dyspnea  These were normal The pt saw B Bhagat in Feb 2021  She continued to note DOE esp with hills/bike  BMET and BNP done  BNP was a little elevated at 433  Lasix ws recommended 3x per wk   Repeat echo ordered   This showed normal LVEF and RVEF   There was mild MR noted   RVSP estimated at 47 mm Hg  In Aug 2021 she had just had CPX done  Limitation felt to be due to pt's habitus/weight  Recmmm exercise training   The pt enrolled in [facility] program    Had significant improvement in her exercise tolerance   Able to use ellippitcal for 30 min     Unfortunately, in  Jan 2022 she has had to stop exercising   Developed upper resp infection (an eye infection).   Has now been on 2 rounds of ABX and prednisone    Feels tired  No fevers    I saw the pt in Jan 2022    She says she feels good, her breathing is improved  Felt bad for about a month   Continues  to [employer]   Working out        Current Meds  Medication Sig   acetaminophen (TYLENOL) 500 MG tablet Take 1,000 mg by mouth every 8 (eight) hours as needed (pain).   ALPRAZolam (XANAX) 0.25 MG tablet Take 1 tablet (0.25 mg total) by mouth 2 (two) times daily as needed for anxiety.   atenolol (TENORMIN) 50 MG tablet  Take 50 mg by mouth daily.   cefdinir (OMNICEF) 300 MG capsule Take 1 capsule (300 mg total) by mouth 2 (two) times daily.   cholecalciferol (VITAMIN D3) 25 MCG (1000 UT) tablet Take 1,000 Units by mouth daily.   EPINEPHrine 0.3 mg/0.3 mL IJ SOAJ injection Inject 0.3 mg into the muscle as needed for anaphylaxis. As needed for life-threatening allergic reactions   famotidine (PEPCID) 20 MG tablet Take 1 tablet (20 mg total) by mouth 2 (two) times daily.   fluticasone-salmeterol (ADVAIR) 100-50 MCG/ACT AEPB Inhale 1 puff into the lungs 2 (two) times daily.   hydroxychloroquine (PLAQUENIL) 200 MG tablet Take 200 mg by mouth every morning.    hydrOXYzine (ATARAX/VISTARIL) 25 MG tablet Take 1 tablet (25 mg total) by mouth every 6 (six) hours as needed.   loratadine (CLARITIN) 10 MG tablet Take 1 tablet (10 mg total) by mouth daily.   Multiple Vitamin (MULTIVITAMIN) capsule Take 1 capsule by mouth daily.   predniSONE (DELTASONE) 20 MG tablet 3 tabs po day x 2 days, then 2 po daily x 3  days, then 1 po daily x 3 days, then 0.5 po daily x 4 days   rosuvastatin (CRESTOR) 5 MG tablet Take 1 tablet (5 mg total) by mouth daily.   sertraline (ZOLOFT) 50 MG tablet Take 0.5 tablets (25 mg total) by mouth daily.   XARELTO 20 MG TABS tablet TAKE 1 TABLET BY MOUTH DAILY WITH SUPPER     Allergies:   Epinephrine base, Aspirin, Benadryl [diphenhydramine], Covid-19 ad26 vaccine(janssen), Fish allergy, Hylan g-f 20, Penicillins, and Tape   Past Medical History:  Diagnosis Date   Anticoagulant long-term use    Xarelto   Bronchitis    Chronic interstitial nephritis    Depression    Eye inflammation    History of interstitial nephritis 2016   chronic   History of nuclear stress test 12/16/2014   Intermediate risk nuclear study w/ medium size moderate severity reversible defect in the basal and mid inferolateral and inferior wall (per dr cardiology note , dr [name] did not think this was consistent with  ischemia)/  normal LV function and wall motion, ef 75%   History of septic shock 01/22/2015   in setting Group A Strep Cellulits erysipelas/chest wall induration with streptoccocus basterium-- Severe sepsis, DIC, Acute respiratory failure with pulmonary edema, Acute Kidney failure with chronic interstitial nephritis   Hypertension    Hyponatremia    Migraines    on Zoloft for migraines   Mild carotid artery disease ([place])    per duplex 08-30-2017 bilateral ICA 1-39%   OA (osteoarthritis) rheumotologist-  dr [name]   both knees,  shoulders, ankles   OSA on CPAP     moderate obstructive sleep apnea with an AHI of 23.4/h and oxygen desaturations as low as 84%.  Now on CPAP at 12 cm H2O.   PAF (paroxysmal atrial fibrillation) [facility]) 2009   cardiologist-- dr [name]   Paroxysmal atrial flutter [facility])    a. dx 11/2017.   PSVT (paroxysmal supraventricular tachycardia) ([place])    Wears contact lenses     Past Surgical History:  Procedure Laterality Date   BREAST BIOPSY Right 05/15/2017   [place]   ESOPHAGOGASTRODUODENOSCOPY N/A 02/08/2015   Procedure: ESOPHAGOGASTRODUODENOSCOPY (EGD);  Surgeon: [name], MD;  Location: [place];  Service: Endoscopy;  Laterality: N/A;   KNEE ARTHROSCOPY W/ MENISCAL REPAIR Left 08/2014    @[facility]    SHOULDER SURGERY Right 04/04/2016   TOTAL KNEE ARTHROPLASTY Right 09/01/2018   Procedure: RIGHT TOTAL KNEE ARTHROPLASTY;  Surgeon: [name], MD;  Location: [facility];  Service: Orthopedics;  Laterality: Right;     Social History:  The patient  reports that she has never smoked. She has never used smokeless tobacco. She reports current alcohol use. She reports that she does not use drugs.   Family History:  The patient's family history includes Allergic rhinitis in her sister; Breast cancer (age of onset: 70) in her sister; Lymphoma in her mother; Pancreatic cancer in her brother; Prostate cancer in her father; Urticaria in her  sister.    ROS:  Please see the history of present illness. All other systems are reviewed and  Negative to the above problem except as noted.    PHYSICAL EXAM: VS:  BP 126/60   Pulse 75   Ht 5\' 5"  (1.651 m)   Wt 237 lb 12.8 oz (107.9 kg)   SpO2 97%   BMI 39.57 kg/m   GEN: Obese 75 yo  in no acute distress  HEENT: normal  Neck: JVP is normal  No carotid bruits Cardiac: RRR; no murmurs,, NO LE  edema  Respiratory:  clear to auscultation bilaterally,  GI: soft, nontender, nondistended, + BS  No hepatomegaly  MS: no deformity Moving all extremities   Skin: warm and dry Neuro:  Grossly intact   Psych: euthymic mood, full affect   EKG:  EKG is  Not ordered today.   Lipid Panel    Component Value Date/Time   CHOL 175 11/04/2020 1224   TRIG 69 11/04/2020 1224   HDL 102 11/04/2020 1224   CHOLHDL 1.7 11/04/2020 1224   CHOLHDL 2 05/28/2018 0837   VLDL 13.8 05/28/2018 0837   LDLCALC 60 11/04/2020 1224      Wt Readings from Last 3 Encounters:  03/27/21 237 lb 12.8 oz (107.9 kg)  03/20/21 239 lb (108.4 kg)  03/12/21 238 lb 1.6 oz (108 kg)    Echo:   1. Left ventricular ejection fraction, by estimation, is 55 to 60%. The left ventricle has normal function. The left ventricle has no regional wall motion abnormalities. There is mild concentric left ventricular hypertrophy. Left ventricular diastolic parameters were normal. 2. Right ventricular systolic function is normal. The right ventricular size is normal. There is moderately elevated pulmonary artery systolic pressure. 3. The mitral valve is normal in structure and function. Mild mitral valve regurgitation. No evidence of mitral stenosis. 4. The aortic valve is tricuspid. Aortic valve regurgitation is not visualized. No aortic stenosis is present. 5. The inferior vena cava is normal in size with <50% respiratory variability, suggesting right atrial pressure of 8 mmHg. Comparison(s): 01/25/15 EF 55-60%. PA pressure  38mmHg.  ASSESSMENT AND PLAN:  1 Hx of dyspnea   The pt says her breathing is great   She is back to exercising at [facility]    2   PAF  Clinically in SR   Rare palpitations   Keep on Xarelto    WOrking on coverage  2 Hx diastolic dysfunction. Volume looks OK today    3 Hx CP    Denies CP   Ca score is 0   Pt has mild plaquing of aorta  Continue Crestor   2  HTN  BP is good      3  CV dz Mild plaquing  Keep on statin     4  HL  Keep on Crestor given atherosclerosis of aorta   LDL 60  HDL 102  Trig 69    5  MR   Mild on echo   No mrumur     WIll follow clinically    F/U in Feb/March      Current medicines are reviewed at length with the patient today.  The patient does not have concerns regarding medicines.  Signed, [name], MD  03/27/2021 11:40 AM    [address], [place],   [address] Phone: [phone]; Fax: [phone]

## 2021-03-27 ENCOUNTER — Encounter: Payer: Self-pay | Admitting: Internal Medicine

## 2021-03-27 ENCOUNTER — Ambulatory Visit (INDEPENDENT_AMBULATORY_CARE_PROVIDER_SITE_OTHER): Payer: Medicare Other | Admitting: Internal Medicine

## 2021-03-27 ENCOUNTER — Other Ambulatory Visit: Payer: Self-pay

## 2021-03-27 VITALS — BP 126/60 | HR 75 | Ht 65.0 in | Wt 237.8 lb

## 2021-03-27 DIAGNOSIS — I2510 Atherosclerotic heart disease of native coronary artery without angina pectoris: Secondary | ICD-10-CM | POA: Diagnosis not present

## 2021-03-27 NOTE — Patient Instructions (Signed)
Medication Instructions:  No changes *If you need a refill on your cardiac medications before your next appointment, please call your pharmacy*   Lab Work: none If you have labs (blood work) drawn today and your tests are completely normal, you will receive your results only by: [place] (if you have MyChart) OR A paper copy in the mail If you have any lab test that is abnormal or we need to change your treatment, we will call you to review the results.   Testing/Procedures: none   Follow-Up: At [facility], you and your health needs are our priority.  As part of our continuing mission to provide you with exceptional heart care, we have created designated Provider Care Teams.  These Care Teams include your primary Cardiologist (physician) and Advanced Practice Providers (APPs -  Physician Assistants and Nurse Practitioners) who all work together to provide you with the care you need, when you need it.  Your next appointment:   9 month(s)  The format for your next appointment:   In Person  Provider:   You may see [name], MD or one of the following Advanced Practice Providers on your designated Care Team:   Richardson Dopp, PA-C Robbie Lis, [place]   Other Instructions

## 2021-03-27 NOTE — Telephone Encounter (Signed)
Signed forms faxed at this time.

## 2021-03-28 NOTE — Telephone Encounter (Signed)
**Note De-Identified Tim [place] Obfuscation** Letter received from [facility]o fax stating that they have denied the pts Xarelto Tier Exception. Reason for denial: Xarelto is already at the lowest tier possible for a name brand medication.per Medicare part D.

## 2021-04-07 ENCOUNTER — Other Ambulatory Visit: Payer: Self-pay

## 2021-04-07 ENCOUNTER — Encounter: Payer: Self-pay | Admitting: Obstetrics & Gynecology

## 2021-04-07 ENCOUNTER — Other Ambulatory Visit (HOSPITAL_COMMUNITY)
Admission: RE | Admit: 2021-04-07 | Discharge: 2021-04-07 | Disposition: A | Discharge status: Home / Self Care | Payer: Medicare Other | Source: Ambulatory Visit | Attending: Obstetrics & Gynecology | Admitting: Obstetrics & Gynecology

## 2021-04-07 ENCOUNTER — Ambulatory Visit (INDEPENDENT_AMBULATORY_CARE_PROVIDER_SITE_OTHER): Payer: Medicare Other | Admitting: Obstetrics & Gynecology

## 2021-04-07 VITALS — BP 130/84 | Ht 64.25 in | Wt 236.0 lb

## 2021-04-07 DIAGNOSIS — Z6841 Body Mass Index (BMI) 40.0 and over, adult: Secondary | ICD-10-CM | POA: Diagnosis not present

## 2021-04-07 DIAGNOSIS — Z01419 Encounter for gynecological examination (general) (routine) without abnormal findings: Secondary | ICD-10-CM | POA: Insufficient documentation

## 2021-04-07 DIAGNOSIS — Z9189 Other specified personal risk factors, not elsewhere classified: Secondary | ICD-10-CM | POA: Diagnosis not present

## 2021-04-07 DIAGNOSIS — R300 Dysuria: Secondary | ICD-10-CM

## 2021-04-07 DIAGNOSIS — Z780 Asymptomatic menopausal state: Secondary | ICD-10-CM | POA: Diagnosis not present

## 2021-04-07 LAB — URINALYSIS, COMPLETE W/RFL CULTURE
Bacteria, UA: NONE SEEN /HPF
Bilirubin Urine: NEGATIVE
Glucose, UA: NEGATIVE
Hgb urine dipstick: NEGATIVE
Hyaline Cast: NONE SEEN /LPF
Ketones, ur: NEGATIVE
Leukocyte Esterase: NEGATIVE
Nitrites, Initial: NEGATIVE
Protein, ur: NEGATIVE
RBC / HPF: NONE SEEN /HPF (ref 0–2)
Specific Gravity, Urine: 1.01 (ref 1.001–1.035)
WBC, UA: NONE SEEN /HPF (ref 0–5)
pH: 6.5 (ref 5.0–8.0)

## 2021-04-07 LAB — NO CULTURE INDICATED

## 2021-04-07 NOTE — Progress Notes (Signed)
Amanda Wilkins 07-Jun-1946 [id number]   History:    75 y.o. G2P2L2 Remarried x about 20 years.   RP:  Established patient presenting for annual gyn exam   HPI:  Postmenopause.  No HRT x 05/2018.  No PMB.  Hot flushes every day, but tolerable.  No pelvic pain.  Sexually active occasionally with no pain.  Urine/BMs wnl.  Breasts wnl. BMI 40.19.  Health labs with Fam MD.  [place] 2012.   Past medical history,surgical history, family history and social history were all reviewed and documented in the EPIC chart.  Gynecologic History No LMP recorded. Patient is postmenopausal.  Obstetric History OB History  Gravida Para Term Preterm AB Living  2 2 2     2   SAB IAB Ectopic Multiple Live Births          2    # Outcome Date GA Lbr Len/2nd Weight Sex Delivery Anes PTL Lv  2 Term     M Vag-Spont  N LIV  1 Term     M CS-Unspec  N LIV     ROS: A ROS was performed and pertinent positives and negatives are included in the history.  GENERAL: No fevers or chills. HEENT: No change in vision, no earache, sore throat or sinus congestion. NECK: No pain or stiffness. CARDIOVASCULAR: No chest pain or pressure. No palpitations. PULMONARY: No shortness of breath, cough or wheeze. GASTROINTESTINAL: No abdominal pain, nausea, vomiting or diarrhea, melena or bright red blood per rectum. GENITOURINARY: No urinary frequency, urgency, hesitancy or dysuria. MUSCULOSKELETAL: No joint or muscle pain, no back pain, no recent trauma. DERMATOLOGIC: No rash, no itching, no lesions. ENDOCRINE: No polyuria, polydipsia, no heat or cold intolerance. No recent change in weight. HEMATOLOGICAL: No anemia or easy bruising or bleeding. NEUROLOGIC: No headache, seizures, numbness, tingling or weakness. PSYCHIATRIC: No depression, no loss of interest in normal activity or change in sleep pattern.     Exam:   BP 130/84   Ht 5' 4.25" (1.632 m)   Wt 236 lb (107 kg)   BMI 40.19 kg/m   Body mass index is 40.19  kg/m.  General appearance : Well developed well nourished female. No acute distress HEENT: Eyes: no retinal hemorrhage or exudates,  Neck supple, trachea midline, no carotid bruits, no thyroidmegaly Lungs: Clear to auscultation, no rhonchi or wheezes, or rib retractions  Heart: Regular rate and rhythm, no murmurs or gallops Breast:Examined in sitting and supine position were symmetrical in appearance, no palpable masses or tenderness,  no skin retraction, no nipple inversion, no nipple discharge, no skin discoloration, no axillary or supraclavicular lymphadenopathy Abdomen: no palpable masses or tenderness, no rebound or guarding Extremities: no edema or skin discoloration or tenderness  Pelvic: Vulva: Normal             Vagina: No gross lesions or discharge  Cervix: No gross lesions or discharge.  Pap reflex done.  Uterus  AV, normal size, shape and consistency, non-tender and mobile  Adnexa  Without masses or tenderness  Anus: Normal  U/A Negative   Assessment/Plan:  75 y.o. female for annual exam   1. Encounter for routine gynecological examination with Papanicolaou smear of cervix Normal gynecologic exam in menopause.  Pap reflex done.  Breast exam normal.  Screening mammogram negative in November 2021.  Colonoscopy to repeat this year.  Health labs with family physician. - Cytology - PAP( )  2. Postmenopause Well on no hormone replacement therapy.  No postmenopausal bleeding.  Bone density in August 2018 was normal.  Will repeat at  3. Dysuria Urine analysis negative.  Patient reassured. - Urinalysis,Complete w/RFL Culture  4. Class 3 severe obesity due to excess calories with serious comorbidity and body mass index (BMI) of 40.0 to 44.9 in adult [facility])  Recommend a lower calorie/carb diet.  Aerobic activities 5 times a week and light weightlifting every 2 days.  Princess Bruins MD, 12:16 PM 04/07/2021

## 2021-04-10 ENCOUNTER — Encounter: Payer: Self-pay | Admitting: Obstetrics & Gynecology

## 2021-04-10 LAB — CYTOLOGY - PAP: Diagnosis: NEGATIVE

## 2021-04-12 ENCOUNTER — Other Ambulatory Visit (HOSPITAL_COMMUNITY): Payer: Self-pay | Admitting: Medical

## 2021-04-12 ENCOUNTER — Other Ambulatory Visit: Payer: Self-pay

## 2021-04-12 ENCOUNTER — Ambulatory Visit (HOSPITAL_COMMUNITY)
Admission: RE | Admit: 2021-04-12 | Discharge: 2021-04-12 | Disposition: A | Discharge status: Home / Self Care | Payer: Medicare Other | Source: Ambulatory Visit | Attending: Vascular Surgery | Admitting: Vascular Surgery

## 2021-04-12 DIAGNOSIS — M7989 Other specified soft tissue disorders: Secondary | ICD-10-CM | POA: Diagnosis not present

## 2021-04-12 DIAGNOSIS — Z7901 Long term (current) use of anticoagulants: Secondary | ICD-10-CM | POA: Insufficient documentation

## 2021-04-12 DIAGNOSIS — I4891 Unspecified atrial fibrillation: Secondary | ICD-10-CM | POA: Insufficient documentation

## 2021-04-12 DIAGNOSIS — M79605 Pain in left leg: Secondary | ICD-10-CM | POA: Insufficient documentation

## 2021-04-12 DIAGNOSIS — M79604 Pain in right leg: Secondary | ICD-10-CM | POA: Insufficient documentation

## 2021-04-12 DIAGNOSIS — M25562 Pain in left knee: Secondary | ICD-10-CM | POA: Diagnosis not present

## 2021-04-12 DIAGNOSIS — M79662 Pain in left lower leg: Secondary | ICD-10-CM | POA: Diagnosis not present

## 2021-04-12 DIAGNOSIS — M79661 Pain in right lower leg: Secondary | ICD-10-CM | POA: Diagnosis not present

## 2021-04-12 NOTE — Progress Notes (Signed)
LLE venous duplex has been completed.  Girtha Rm, PA called and given preliminary results.  Results can be found under chart review under CV PROC. 04/12/2021 3:39 PM Jessicah Croll RVT, RDMS

## 2021-04-18 DIAGNOSIS — Z20822 Contact with and (suspected) exposure to covid-19: Secondary | ICD-10-CM | POA: Diagnosis not present

## 2021-04-19 DIAGNOSIS — M1712 Unilateral primary osteoarthritis, left knee: Secondary | ICD-10-CM | POA: Diagnosis not present

## 2021-04-20 ENCOUNTER — Telehealth: Payer: Self-pay | Admitting: Internal Medicine

## 2021-04-20 NOTE — Telephone Encounter (Signed)
Patient is calling back to if the nurse or dr [name] was able to find theXARELTO 20 MG TABS tablet for the patient and a cheaper cost. Please advise

## 2021-04-24 NOTE — Telephone Encounter (Signed)
It is 5 mg bid not qd and yes, she can switch

## 2021-04-24 NOTE — Telephone Encounter (Signed)
Ellison is calling back due to still not hearing back in regards to this. Please advise.

## 2021-04-24 NOTE — Telephone Encounter (Signed)
Called patient.  Asked her to check with her insurance company on what the cost of Eliquis would be.  She will call them now.

## 2021-04-25 ENCOUNTER — Telehealth: Payer: Self-pay | Admitting: Internal Medicine

## 2021-04-25 NOTE — Telephone Encounter (Signed)
Pt's calling to discuss her what her pharmacy insurance is telling her

## 2021-04-25 NOTE — Telephone Encounter (Signed)
Called pt to obtain information.  She states that Amanda Wilkins is aware of the situation and she would like to wait to speak to her.  Advised I will send message to her and have her reach out when she is back in the office.  Pt appreciative for call.

## 2021-04-26 MED ORDER — APIXABAN 5 MG PO TABS: 5.0000 mg | ORAL_TABLET | Freq: Two times a day (BID) | ORAL | 5 refills | Status: DC

## 2021-04-26 NOTE — Telephone Encounter (Signed)
Left message for pt to call back  °

## 2021-04-26 NOTE — Telephone Encounter (Signed)
See encounter dated 04/20/20 for details.  Left message for pt to call back.

## 2021-04-26 NOTE — Telephone Encounter (Signed)
Spoke w patient who reports Eliquis is preferred by her insurance.  She will finish her current supply of Xarelto (about 2 weeks) and then will begin Eliquis 5 mg twice daily.    Pt appreciative for assistance and will call back with any further concerns.

## 2021-04-28 DIAGNOSIS — Z20822 Contact with and (suspected) exposure to covid-19: Secondary | ICD-10-CM | POA: Diagnosis not present

## 2021-05-02 ENCOUNTER — Other Ambulatory Visit: Payer: Medicare Other

## 2021-05-09 ENCOUNTER — Encounter (HOSPITAL_BASED_OUTPATIENT_CLINIC_OR_DEPARTMENT_OTHER): Payer: Self-pay

## 2021-05-09 ENCOUNTER — Other Ambulatory Visit: Payer: Self-pay

## 2021-05-09 ENCOUNTER — Emergency Department (HOSPITAL_BASED_OUTPATIENT_CLINIC_OR_DEPARTMENT_OTHER)
Admission: EM | Admit: 2021-05-09 | Discharge: 2021-05-09 | Disposition: A | Discharge status: Home / Self Care | Payer: Medicare Other | Attending: Emergency Medicine | Admitting: Emergency Medicine

## 2021-05-09 DIAGNOSIS — E860 Dehydration: Secondary | ICD-10-CM | POA: Diagnosis not present

## 2021-05-09 DIAGNOSIS — Z8679 Personal history of other diseases of the circulatory system: Secondary | ICD-10-CM | POA: Diagnosis not present

## 2021-05-09 DIAGNOSIS — I10 Essential (primary) hypertension: Secondary | ICD-10-CM | POA: Diagnosis not present

## 2021-05-09 DIAGNOSIS — E871 Hypo-osmolality and hyponatremia: Secondary | ICD-10-CM | POA: Insufficient documentation

## 2021-05-09 DIAGNOSIS — R002 Palpitations: Secondary | ICD-10-CM | POA: Diagnosis not present

## 2021-05-09 DIAGNOSIS — Z96651 Presence of right artificial knee joint: Secondary | ICD-10-CM | POA: Insufficient documentation

## 2021-05-09 LAB — CBC WITH DIFFERENTIAL/PLATELET
Abs Immature Granulocytes: 0.01 10*3/uL (ref 0.00–0.07)
Basophils Absolute: 0 10*3/uL (ref 0.0–0.1)
Basophils Relative: 0 %
Eosinophils Absolute: 0 10*3/uL (ref 0.0–0.5)
Eosinophils Relative: 0 %
HCT: 37.9 % (ref 36.0–46.0)
Hemoglobin: 13.1 g/dL (ref 12.0–15.0)
Immature Granulocytes: 0 %
Lymphocytes Relative: 30 %
Lymphs Abs: 2.1 10*3/uL (ref 0.7–4.0)
MCH: 33.2 pg (ref 26.0–34.0)
MCHC: 34.6 g/dL (ref 30.0–36.0)
MCV: 95.9 fL (ref 80.0–100.0)
Monocytes Absolute: 0.4 10*3/uL (ref 0.1–1.0)
Monocytes Relative: 6 %
Neutro Abs: 4.4 10*3/uL (ref 1.7–7.7)
Neutrophils Relative %: 64 %
Platelets: 194 10*3/uL (ref 150–400)
RBC: 3.95 MIL/uL (ref 3.87–5.11)
RDW: 13.3 % (ref 11.5–15.5)
WBC: 6.9 10*3/uL (ref 4.0–10.5)
nRBC: 0 % (ref 0.0–0.2)

## 2021-05-09 LAB — COMPREHENSIVE METABOLIC PANEL
ALT: 33 U/L (ref 0–44)
AST: 24 U/L (ref 15–41)
Albumin: 4.3 g/dL (ref 3.5–5.0)
Alkaline Phosphatase: 61 U/L (ref 38–126)
Anion gap: 11 (ref 5–15)
BUN: 24 mg/dL — ABNORMAL HIGH (ref 8–23)
CO2: 20 mmol/L — ABNORMAL LOW (ref 22–32)
Calcium: 9.2 mg/dL (ref 8.9–10.3)
Chloride: 98 mmol/L (ref 98–111)
Creatinine, Ser: 0.68 mg/dL (ref 0.44–1.00)
GFR, Estimated: >60 mL/min (ref >60)
Glucose, Bld: 101 mg/dL — ABNORMAL HIGH (ref 70–99)
Potassium: 4.3 mmol/L (ref 3.5–5.1)
Sodium: 129 mmol/L — ABNORMAL LOW (ref 135–145)
Total Bilirubin: 0.5 mg/dL (ref 0.3–1.2)
Total Protein: 7 g/dL (ref 6.5–8.1)

## 2021-05-09 LAB — MAGNESIUM: Magnesium: 2.2 mg/dL (ref 1.7–2.4)

## 2021-05-09 MED ORDER — SODIUM CHLORIDE 0.9 % IV BOLUS: 500.0000 mL | Freq: Once | INTRAVENOUS | Status: DC

## 2021-05-09 NOTE — Discharge Instructions (Addendum)
You were evaluated in the Emergency Department and after careful evaluation, we did not find any emergent condition requiring admission or further testing in the hospital.  Your exam/testing today was overall reassuring.  Recommend close follow-up with your cardiologist to discuss your symptoms.  Please return to the Emergency Department if you experience any worsening of your condition.  Thank you for allowing [place] to be a part of your care.

## 2021-05-09 NOTE — ED Notes (Signed)
Pt is hard stick - refused to have the IVF

## 2021-05-09 NOTE — ED Provider Notes (Signed)
[facility] Emergency Department Provider Note MRN:  [id number]  Arrival date & time: 05/09/21     Chief Complaint   Hypertension   History of Present Illness   Amanda Wilkins is a 75 y.o. year-old female with a history of A. fib presenting to the ED with chief complaint of palpitations.  Patient was going to bed this evening and rolled onto her left side and then felt palpitations.  History of A. fib.  Felt like her heart was racing.  Lasted several minutes.  Took an extra atenolol pill and it resolved.  Denies ever having any chest pain or shortness of breath, no recent fever or cough.  No other complaints.  Drink 4 glasses of wine this evening which is abnormal for her.  Takes all her medications, has not missed any doses.  Review of Systems  A complete 10 system review of systems was obtained and all systems are negative except as noted in the HPI and PMH.   Patient's Health History    Past Medical History:  Diagnosis Date   Anticoagulant long-term use    Xarelto   Bronchitis    Chronic interstitial nephritis    Depression    Eye inflammation    History of interstitial nephritis 2016   chronic   History of nuclear stress test 12/16/2014   Intermediate risk nuclear study w/ medium size moderate severity reversible defect in the basal and mid inferolateral and inferior wall (per dr cardiology note , dr [name] did not think this was consistent with ischemia)/  normal LV function and wall motion, ef 75%   History of septic shock 01/22/2015   in setting Group A Strep Cellulits erysipelas/chest wall induration with streptoccocus basterium-- Severe sepsis, DIC, Acute respiratory failure with pulmonary edema, Acute Kidney failure with chronic interstitial nephritis   Hypertension    Hyponatremia    Migraines    on Zoloft for migraines   Mild carotid artery disease ([place])    per duplex 08-30-2017 bilateral ICA 1-39%   OA (osteoarthritis)  rheumotologist-  dr [name]   both knees,  shoulders, ankles   OSA on CPAP     moderate obstructive sleep apnea with an AHI of 23.4/h and oxygen desaturations as low as 84%.  Now on CPAP at 12 cm H2O.   PAF (paroxysmal atrial fibrillation) [facility]) 2009   cardiologist-- dr [name]   Paroxysmal atrial flutter [facility])    a. dx 11/2017.   PSVT (paroxysmal supraventricular tachycardia) ([place])    Wears contact lenses     Past Surgical History:  Procedure Laterality Date   BREAST BIOPSY Right 05/15/2017   [place]   ESOPHAGOGASTRODUODENOSCOPY N/A 02/08/2015   Procedure: ESOPHAGOGASTRODUODENOSCOPY (EGD);  Surgeon: [name], MD;  Location: [place];  Service: Endoscopy;  Laterality: N/A;   KNEE ARTHROSCOPY W/ MENISCAL REPAIR Left 08/2014    '@[facility]'$    SHOULDER SURGERY Right 04/04/2016   TOTAL KNEE ARTHROPLASTY Right 09/01/2018   Procedure: RIGHT TOTAL KNEE ARTHROPLASTY;  Surgeon: [name], MD;  Location: [facility];  Service: Orthopedics;  Laterality: Right;    Family History  Problem Relation Age of Onset   Pancreatic cancer Brother    Lymphoma Mother    Prostate cancer Father        metastatic prostate cancer   Breast cancer Sister 80   Allergic rhinitis Sister    Urticaria Sister    Asthma Neg Hx    Eczema  Neg Hx    Immunodeficiency Neg Hx    Atopy Neg Hx    Angioedema Neg Hx     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)  Tobacco Use   Smoking status: Never   Smokeless tobacco: Never   Tobacco comments:    H/O social smoking at times when drinking--never a "habit" (in the 1970s)  Vaping Use   Vaping Use: Never used  Substance and Sexual Activity   Alcohol use: Yes     Alcohol/week: 0.0 standard drinks    Comment: Occasional   Drug use: Never   Sexual activity: Yes    Partners: Male    Birth control/protection: Post-menopausal    Comment: intercourse age 50, sexual partners more than 5  Other Topics Concern   Not on file  Social History Narrative   Not on file   Social Determinants of Health   Financial Resource Strain: Not on file  Food Insecurity: Not on file  Transportation Needs: Not on file  Physical Activity: Not on file  Stress: Not on file  Social Connections: Not on file  Intimate Partner Violence: Not on file     Physical Exam   Vitals:   05/09/21 0419 05/09/21 0445  BP: (!) 181/87 (!) 175/88  Pulse: 73   Resp: 16 13  Temp:    SpO2: 100%     CONSTITUTIONAL: Well-appearing, NAD NEURO:  Alert and oriented x 3, no focal deficits EYES:  eyes equal and reactive ENT/NECK:  no LAD, no JVD CARDIO: Regular rate, well-perfused, normal S1 and S2 PULM:  CTAB no wheezing or rhonchi GI/GU:  normal bowel sounds, non-distended, non-tender MSK/SPINE:  No gross deformities, no edema SKIN:  no rash, atraumatic PSYCH:  Appropriate speech and behavior  *Additional and/or pertinent findings included in MDM below  Diagnostic and Interventional Summary    EKG Interpretation  Date/Time:  Tuesday May 09 2021 01:57:42 EDT Ventricular Rate:  75 PR Interval:  186 QRS Duration: 82 QT Interval:  366 QTC Calculation: 408 R Axis:   37 Text Interpretation: Sinus rhythm with Premature atrial complexes Otherwise normal ECG No significant change was found Confirmed by Gerlene Fee [phone]) on 05/09/2021 2:00:11 AM       Labs Reviewed  COMPREHENSIVE METABOLIC PANEL - Abnormal; Notable for the following components:      Result Value   Sodium 129 (*)    CO2 20 (*)    Glucose, Bld 101 (*)    BUN 24 (*)    All other components within normal limits  CBC WITH DIFFERENTIAL/PLATELET  MAGNESIUM    No orders to display    Medications  sodium  chloride 0.9 % bolus 500 mL (500 mLs Intravenous Not Given 05/09/21 0454)     Procedures  /  Critical Care .1-3 Lead EKG Interpretation  Date/Time: 05/09/2021 4:39 AM Performed by: Maudie Flakes, MD Authorized by: Maudie Flakes, MD     Interpretation: normal     ECG rate:  70s   ECG rate assessment: normal     Rhythm: sinus rhythm     Ectopy: PAC     Conduction: normal   Comments:     Cardiac  monitoring was ordered to monitor the patient for dysrhythmia.  I personally interpreted the patient's cardiac monitor while at the bedside.    ED Course and Medical Decision Making  I have reviewed the triage vital signs, the nursing notes, and pertinent available records from the EMR.  Listed above are laboratory and imaging tests that I personally ordered, reviewed, and interpreted and then considered in my medical decision making (see below for details).  Palpitations, possibly due to a brief episode of A. fib.  Here in the emergency department, patient is in sinus rhythm with no symptoms in no acute distress.  Hypertensive but without headache or vision change, no chest pain, no complaints.  Will monitor her on symmetry for her.  Here in the emergency department, provide IV fluids given the evidence of some dehydration and hyponatremia on lab assessment.  Anticipating discharge if no further tachyarrhythmias.       Barth Kirks. Sedonia Small, [place] [email or web address]  Final Clinical Impressions(s) / ED Diagnoses     ICD-10-CM   1. Palpitations  R00.2       ED Discharge Orders     None        Discharge Instructions Discussed with and Provided to Patient:     Discharge Instructions      You were evaluated in the Emergency Department and after careful evaluation, we did not find any emergent condition requiring admission or further testing in the hospital.  Your exam/testing today was overall reassuring.  Recommend close  follow-up with your cardiologist to discuss your symptoms.  Please return to the Emergency Department if you experience any worsening of your condition.  Thank you for allowing [place] to be a part of your care.         Maudie Flakes, MD 05/09/21 [phone]

## 2021-05-09 NOTE — ED Triage Notes (Signed)
Patient here POV from Home with HTN.  Patient stated she felt "off" and went to check her BP this PM and states it was 180/120.  Patient takes 50 mg Atenolol in the AM and took an extra dose this PM due to her heart feeling as if it was beating rapidly with no change in feeling.  Ambulatory, GCS 15.

## 2021-05-10 ENCOUNTER — Ambulatory Visit (INDEPENDENT_AMBULATORY_CARE_PROVIDER_SITE_OTHER): Payer: Medicare Other | Admitting: Internal Medicine

## 2021-05-10 ENCOUNTER — Encounter: Payer: Self-pay | Admitting: Internal Medicine

## 2021-05-10 ENCOUNTER — Ambulatory Visit: Payer: Medicare Other | Admitting: Internal Medicine

## 2021-05-10 VITALS — BP 134/68 | HR 65 | Ht 65.0 in | Wt 235.2 lb

## 2021-05-10 DIAGNOSIS — I2510 Atherosclerotic heart disease of native coronary artery without angina pectoris: Secondary | ICD-10-CM

## 2021-05-10 DIAGNOSIS — I480 Paroxysmal atrial fibrillation: Secondary | ICD-10-CM

## 2021-05-10 NOTE — Patient Instructions (Signed)
Medication Instructions:  No changes *If you need a refill on your cardiac medications before your next appointment, please call your pharmacy*   Lab Work: none If you have labs (blood work) drawn today and your tests are completely normal, you will receive your results only by: . MyChart Message (if you have MyChart) OR . A paper copy in the mail If you have any lab test that is abnormal or we need to change your treatment, we will call you to review the results.   Testing/Procedures: none   Follow-Up: As planned  Other Instructions   

## 2021-05-10 NOTE — Progress Notes (Signed)
Cardiology Office Note   Date:  05/10/2021   ID:  Tanya Syler, DOB 1946-03-18, MRN [id number]  PCP:  [name], MD  Cardiologist:   [name], MD    F/u of PAF     History of Present Illness: Amanda Wilkins is a 75 y.o. female with a history of chest pain, near syncope  and PAF  Echo in 2016 showed normal LVEF Carotid USN showed mild plaquing   Holter showed intermitt afib/SVT (first Dx 2010)  Recomm flecanide; pt declined Rx Myovue showed inferolateral and inferior defect consistent with ischemia  CT calcium score 2020 was 0 PFTs for dysneal normal  Feb 2021  Still DOE  Recomm lasix 3x per week for mild BNP elevation Echo showed normal LVEF / RVEF    Mild MR   CPX in Aug 2021  Limitation felt due to weight and body habitus After this the pt enrolled in [facility] program and noted significant improvement    Using elliptical for 30 min  Short interruption for infection then back I saw her last in June 2022  She was doing good at the time  Rare palpitation   This week she had friends visiting   She admits to having 4-5 glasses of wine one night   Develop tachypalpitations   Took an extra atenolol and resolved     Went to ED to get checked On arrival was in SR     BP 181/87     Sent home   Since d/c she has felt good  Breathing OK  No palpitatoins   Says she is not doing that again       Current Meds  Medication Sig   acetaminophen (TYLENOL) 500 MG tablet Take 1,000 mg by mouth every 8 (eight) hours as needed (pain).   ALPRAZolam (XANAX) 0.25 MG tablet Take 1 tablet (0.25 mg total) by mouth 2 (two) times daily as needed for anxiety.   apixaban (ELIQUIS) 5 MG TABS tablet Take 1 tablet (5 mg total) by mouth 2 (two) times daily.   atenolol (TENORMIN) 50 MG tablet Take 50 mg by mouth daily.   cholecalciferol (VITAMIN D3) 25 MCG (1000 UT) tablet Take 1,000 Units by mouth daily.   EPINEPHrine 0.3 mg/0.3 mL IJ SOAJ injection Inject 0.3 mg into the muscle as needed  for anaphylaxis. As needed for life-threatening allergic reactions   famotidine (PEPCID) 20 MG tablet Take 1 tablet (20 mg total) by mouth 2 (two) times daily.   hydroxychloroquine (PLAQUENIL) 200 MG tablet Take 200 mg by mouth every morning.    Multiple Vitamin (MULTIVITAMIN) capsule Take 1 capsule by mouth daily.   rosuvastatin (CRESTOR) 5 MG tablet Take 1 tablet (5 mg total) by mouth daily.   sertraline (ZOLOFT) 50 MG tablet Take 0.5 tablets (25 mg total) by mouth daily.     Allergies:   Epinephrine base, Aspirin, Benadryl [diphenhydramine], Covid-19 ad26 vaccine(janssen), Fish allergy, Hylan g-f 20, Penicillins, and Tape   Past Medical History:  Diagnosis Date   Anticoagulant long-term use    Xarelto   Bronchitis    Chronic interstitial nephritis    Depression    Eye inflammation    History of interstitial nephritis 2016   chronic   History of nuclear stress test 12/16/2014   Intermediate risk nuclear study w/ medium size moderate severity reversible defect in the basal and mid inferolateral and inferior wall (per dr cardiology note , dr [name] did not think this was consistent  with ischemia)/  normal LV function and wall motion, ef 75%   History of septic shock 01/22/2015   in setting Group A Strep Cellulits erysipelas/chest wall induration with streptoccocus basterium-- Severe sepsis, DIC, Acute respiratory failure with pulmonary edema, Acute Kidney failure with chronic interstitial nephritis   Hypertension    Hyponatremia    Migraines    on Zoloft for migraines   Mild carotid artery disease ([place])    per duplex 08-30-2017 bilateral ICA 1-39%   OA (osteoarthritis) rheumotologist-  dr [name]   both knees,  shoulders, ankles   OSA on CPAP     moderate obstructive sleep apnea with an AHI of 23.4/h and oxygen desaturations as low as 84%.  Now on CPAP at 12 cm H2O.   PAF (paroxysmal atrial fibrillation) [facility]) 2009   cardiologist-- dr [name]   Paroxysmal atrial  flutter [facility])    a. dx 11/2017.   PSVT (paroxysmal supraventricular tachycardia) ([place])    Wears contact lenses     Past Surgical History:  Procedure Laterality Date   BREAST BIOPSY Right 05/15/2017   [place]   ESOPHAGOGASTRODUODENOSCOPY N/A 02/08/2015   Procedure: ESOPHAGOGASTRODUODENOSCOPY (EGD);  Surgeon: [name], MD;  Location: [place];  Service: Endoscopy;  Laterality: N/A;   KNEE ARTHROSCOPY W/ MENISCAL REPAIR Left 08/2014    '@[facility]'$    SHOULDER SURGERY Right 04/04/2016   TOTAL KNEE ARTHROPLASTY Right 09/01/2018   Procedure: RIGHT TOTAL KNEE ARTHROPLASTY;  Surgeon: [name], MD;  Location[facility];  Service: Orthopedics;  Laterality: Right;     Social History:  The patient  reports that she has never smoked. She has never used smokeless tobacco. She reports current alcohol use. She reports that she does not use drugs.   Family History:  The patient's family history includes Allergic rhinitis in her sister; Breast cancer (age of onset: 64) in her sister; Lymphoma in her mother; Pancreatic cancer in her brother; Prostate cancer in her father; Urticaria in her sister.    ROS:  Please see the history of present illness. All other systems are reviewed and  Negative to the above problem except as noted.    PHYSICAL EXAM: VS:  BP 134/68   Pulse 65   Ht '5\' 5"'$  (1.651 m)   Wt 235 lb 3.2 oz (106.7 kg)   SpO2 96%   BMI 39.14 kg/m   GEN: Morbidly obese 75 yo  in no acute distress  HEENT: normal  Neck: JVP is normal  No carotid bruits Cardiac: RRR; no murmurs,, NO LE  edema  Respiratory:  clear to auscultation bilaterally,  GI: soft, nontender, nondistended, + BS  No hepatomegaly  MS: no deformity Moving all extremities   Skin: warm and dry Neuro:  Grossly intact   Psych: euthymic mood, full affect   EKG:  EKG is  Not ordered today.   Lipid Panel    Component Value Date/Time   CHOL 175 11/04/2020 1224   TRIG 69 11/04/2020 1224   HDL 102  11/04/2020 1224   CHOLHDL 1.7 11/04/2020 1224   CHOLHDL 2 05/28/2018 0837   VLDL 13.8 05/28/2018 0837   LDLCALC 60 11/04/2020 1224      Wt Readings from Last 3 Encounters:  05/10/21 235 lb 3.2 oz (106.7 kg)  05/09/21 235 lb (106.6 kg)  04/07/21 236 lb (107 kg)    Echo:   1. Left ventricular ejection fraction, by estimation, is 55 to 60%. The left ventricle  has normal function. The left ventricle has no regional wall motion abnormalities. There is mild concentric left ventricular hypertrophy. Left ventricular diastolic parameters were normal. 2. Right ventricular systolic function is normal. The right ventricular size is normal. There is moderately elevated pulmonary artery systolic pressure. 3. The mitral valve is normal in structure and function. Mild mitral valve regurgitation. No evidence of mitral stenosis. 4. The aortic valve is tricuspid. Aortic valve regurgitation is not visualized. No aortic stenosis is present. 5. The inferior vena cava is normal in size with <50% respiratory variability, suggesting right atrial pressure of 8 mmHg. Comparison(s): 01/25/15 EF 55-60%. PA pressure 70mHg.  ASSESSMENT AND PLAN  1 PAF   Pt with spell of tachypalpitations that most likely was atrial fibrllation  Converted on own    Keep on current regimen  2  HTN  BPmarkedly elevated in ED  Better here  Follow   3  Hx of dyspnea   Doing good  Denies SOB  2 Hx diastolic dysfunction. Volume remains good    3 Hx CP    Denies CP   Ca score is 0   Pt has mild plaquing of aorta  Continue Crestor    3  CV dz Mild plaquing  Keep on statin     4  HL  Keep on Crestor given atherosclerosis of aorta   LDL 60  HDL 102  Trig 69    5  MR   Mild on echo  Follow clinically   Plan for f/u next spring   Sooner for problems      Current medicines are reviewed at length with the patient today.  The patient does not have concerns regarding medicines.  Signed, PDorris Carnes MD  05/10/2021 8:05 PM     [address]  [id number]: [phone] Fax: [phone]

## 2021-05-30 ENCOUNTER — Ambulatory Visit: Payer: Medicare Other | Admitting: Internal Medicine

## 2021-06-03 ENCOUNTER — Other Ambulatory Visit: Payer: Self-pay | Admitting: Physician Assistant

## 2021-06-03 ENCOUNTER — Other Ambulatory Visit: Payer: Self-pay | Admitting: Internal Medicine

## 2021-06-06 ENCOUNTER — Encounter: Payer: Self-pay | Admitting: Family Medicine

## 2021-06-06 ENCOUNTER — Ambulatory Visit (INDEPENDENT_AMBULATORY_CARE_PROVIDER_SITE_OTHER): Payer: Medicare Other | Admitting: Family Medicine

## 2021-06-06 ENCOUNTER — Other Ambulatory Visit: Payer: Self-pay

## 2021-06-06 ENCOUNTER — Ambulatory Visit (INDEPENDENT_AMBULATORY_CARE_PROVIDER_SITE_OTHER): Payer: Medicare Other

## 2021-06-06 DIAGNOSIS — M25551 Pain in right hip: Secondary | ICD-10-CM

## 2021-06-06 MED ORDER — METHYLPREDNISOLONE 4 MG PO TBPK: ORAL_TABLET | ORAL | 0 refills | Status: DC
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
## 2021-06-07 ENCOUNTER — Telehealth: Payer: Self-pay | Admitting: Family Medicine

## 2021-06-07 NOTE — Telephone Encounter (Signed)
No referral is needed. She can just be scheduled with Dr. Ninfa Linden.

## 2021-06-07 NOTE — Telephone Encounter (Signed)
Pt called asking for referral to see Dr. Ninfa Linden for right hip pain. Pt is a pt of Dr. Junius Roads and asking for a return call concerning this matter. Pt phone number is [phone].

## 2021-06-14 ENCOUNTER — Telehealth: Payer: Self-pay | Admitting: Internal Medicine

## 2021-06-14 DIAGNOSIS — R61 Generalized hyperhidrosis: Secondary | ICD-10-CM

## 2021-06-14 NOTE — Telephone Encounter (Signed)
Pt was sweating without moving around bp was 90/84 hr 87, pt says this happens anytime she stands she is sweating a lot. Her legs also feel tired and weak.    Pt c/o BP issue: STAT if pt c/o blurred vision, one-sided weakness or slurred speech  1. What are your last 5 BP readings? 90/84  2. Are you having any other symptoms (ex. Dizziness, headache, blurred vision, passed out)? Sweating and leg weakness  3. What is your BP issue? Pt was sweating without moving around bp was 90/84 hr 87, pt says this happens anytime she stands she is sweating a lot. Her legs also feel tired and weak.

## 2021-06-14 NOTE — Telephone Encounter (Signed)
Spoke w patient. She reports severe sweating while standing still.  Drips off of her.  This is not a new problem.  Happening for 15 years and seems worse lately.  Does not sweat so bad when she works out.  Worried her when her legs started to get weak so she took her BP and it was 90/84.  There are times when she stands that she has to stay still a min because she gets lightheaded.  This does occur during the sweating episodes.    We discussed fluid intake and although she feels she gets enough to drink, she will increase to see if helps.  Also adv could try compression stockings, garments but does not sound like the sweating is related to her BP dropping.  She does not get a fast or pounding heartbeat or lightheaded.  I asked her to follow up with her PCP and told her I would let Dr. [name] know.  She reports she has discussed with Dr. [name] in the past and they have not found a cause.  Also has discussed with GYN as she wonders if hormones play a part.

## 2021-06-15 NOTE — Telephone Encounter (Signed)
Pt's BP has been up and down With it being low consider cutting back on atenolol to 25 mg per day  Increase if BP rebounds  Follow BP and HR She had labs recently  All normal   Did not check cortisol   Would check this as well as UA Can she come in to get orthostatics to see me or for nurses visit

## 2021-06-16 NOTE — Telephone Encounter (Addendum)
Spoke w patient.  She will come in 06/20/21 for UA/cortisol and a nurse visit for orthostatics.  She will decrease atenolol for now to 25 mg daily and monitor BP/HR.  Knows ok to talk other 25 mg if needed for increased HR/BP.

## 2021-06-20 ENCOUNTER — Ambulatory Visit (INDEPENDENT_AMBULATORY_CARE_PROVIDER_SITE_OTHER): Payer: Medicare Other

## 2021-06-20 ENCOUNTER — Other Ambulatory Visit: Payer: Medicare Other | Admitting: *Deleted

## 2021-06-20 ENCOUNTER — Ambulatory Visit (INDEPENDENT_AMBULATORY_CARE_PROVIDER_SITE_OTHER): Payer: Medicare Other | Admitting: *Deleted

## 2021-06-20 ENCOUNTER — Other Ambulatory Visit: Payer: Self-pay

## 2021-06-20 VITALS — BP 120/60 | HR 82 | Temp 98.8°F | Ht 65.0 in | Wt 241.8 lb

## 2021-06-20 VITALS — BP 130/80 | HR 72 | Wt 241.0 lb

## 2021-06-20 DIAGNOSIS — Z0000 Encounter for general adult medical examination without abnormal findings: Secondary | ICD-10-CM

## 2021-06-20 DIAGNOSIS — R61 Generalized hyperhidrosis: Secondary | ICD-10-CM | POA: Diagnosis not present

## 2021-06-20 DIAGNOSIS — Z0130 Encounter for examination of blood pressure without abnormal findings: Secondary | ICD-10-CM | POA: Diagnosis not present

## 2021-06-20 NOTE — Progress Notes (Signed)
Reason for visit: weakness w standing, sweatiness  Name of MD requesting visit: Dr. [name]  H&P: Pt reports the profuse sweating has been happening for 15 + years. Was better when on HRT  now it is the worse w standing long periods or after walking  ROS related to problem: here today for orthostatic vs and labs/urine - taking urine cup w her - unable to void at office, wonders if she is retaining fluid due to her weight not decreasing despite cutting out alcohol x1 month, watching diet.  There is no pedal/ankle edema.   Assessment and plan per MD: pt did not experience any weakness or sweating during orthstatics.  After we finished and she was standing in the room she reported she was starting to perspire.  I encouraged her to speak w gynecology and she said she has and was told there is nothing they can give her.  Will route to Dr. [name] for review.    In regard to cortisol, pt wants Dr. [name] to be aware she recently has had 2 steroid dose packs for her hip pain.  She is going to continue splitting atenolol 25 mg - twice daily instead of 50 mg daily

## 2021-06-20 NOTE — Patient Instructions (Signed)
Ms. Felling , Thank you for taking time to come for your Medicare Wellness Visit. I appreciate your ongoing commitment to your health goals. Please review the following plan we discussed and let me know if I can assist you in the future.   Screening recommendations/referrals: Colonoscopy: 01/29/2011; due every 10 years (Overdue) Mammogram: 09/07/2020; due every 1-2 years Bone Density: 05/30/2017; due every 5 years Recommended yearly ophthalmology/optometry visit for glaucoma screening and checkup Recommended yearly dental visit for hygiene and checkup  Vaccinations: Influenza vaccine: 08/10/2020; due Fall 2022 Pneumococcal vaccine: 05/25/2014, 06/21/2015; need Prevar20 Tdap vaccine: 01/06/2013; due every 10 years Shingles vaccine: no record; Please call your insurance company to determine your out of pocket expense for the Shingrix vaccine. You may receive this vaccine at your local pharmacy.    Covid-19: 11/05/2019, 11/26/2019, 07/09/2020, 03/09/2021  Advanced directives: Please bring a copy of your health care power of attorney and living will to the office at your convenience.  Conditions/risks identified: Yes; Client understands the importance of follow-up with providers by attending scheduled visits and discussed goals to eat healthier, increase physical activity, exercise the brain, socialize more, get enough sleep and make time for laughter.  Next appointment: Please schedule your next Medicare Wellness Visit with your Nurse Health Advisor in 1 year by calling [phone].   Preventive Care 17 Years and Older, Female Preventive care refers to lifestyle choices and visits with your health care provider that can promote health and wellness. What does preventive care include? A yearly physical exam. This is also called an annual well check. Dental exams once or twice a year. Routine eye exams. Ask your health care provider how often you should have your eyes checked. Personal lifestyle  choices, including: Daily care of your teeth and gums. Regular physical activity. Eating a healthy diet. Avoiding tobacco and drug use. Limiting alcohol use. Practicing safe sex. Taking low-dose aspirin every day. Taking vitamin and mineral supplements as recommended by your health care provider. What happens during an annual well check? The services and screenings done by your health care provider during your annual well check will depend on your age, overall health, lifestyle risk factors, and family history of disease. Counseling  Your health care provider may ask you questions about your: Alcohol use. Tobacco use. Drug use. Emotional well-being. Home and relationship well-being. Sexual activity. Eating habits. History of falls. Memory and ability to understand (cognition). Work and work Statistician. Reproductive health. Screening  You may have the following tests or measurements: Height, weight, and BMI. Blood pressure. Lipid and cholesterol levels. These may be checked every 5 years, or more frequently if you are over 40 years old. Skin check. Lung cancer screening. You may have this screening every year starting at age 23 if you have a 30-pack-year history of smoking and currently smoke or have quit within the past 15 years. Fecal occult blood test (FOBT) of the stool. You may have this test every year starting at age 73. Flexible sigmoidoscopy or colonoscopy. You may have a sigmoidoscopy every 5 years or a colonoscopy every 10 years starting at age 63. Hepatitis C blood test. Hepatitis B blood test. Sexually transmitted disease (STD) testing. Diabetes screening. This is done by checking your blood sugar (glucose) after you have not eaten for a while (fasting). You may have this done every 1-3 years. Bone density scan. This is done to screen for osteoporosis. You may have this done starting at age 53. Mammogram. This may be done every 1-2 years.  Talk to your health care  provider about how often you should have regular mammograms. Talk with your health care provider about your test results, treatment options, and if necessary, the need for more tests. Vaccines  Your health care provider may recommend certain vaccines, such as: Influenza vaccine. This is recommended every year. Tetanus, diphtheria, and acellular pertussis (Tdap, Td) vaccine. You may need a Td booster every 10 years. Zoster vaccine. You may need this after age 1. Pneumococcal 13-valent conjugate (PCV13) vaccine. One dose is recommended after age 34. Pneumococcal polysaccharide (PPSV23) vaccine. One dose is recommended after age 55. Talk to your health care provider about which screenings and vaccines you need and how often you need them. This information is not intended to replace advice given to you by your health care provider. Make sure you discuss any questions you have with your health care provider. Document Released: 10/28/2015 Document Revised: 06/20/2016 Document Reviewed: 08/02/2015 Elsevier Interactive Patient Education  2017 [place] Prevention in the Home Falls can cause injuries. They can happen to people of all ages. There are many things you can do to make your home safe and to help prevent falls. What can I do on the outside of my home? Regularly fix the edges of walkways and driveways and fix any cracks. Remove anything that might make you trip as you walk through a door, such as a raised step or threshold. Trim any bushes or trees on the path to your home. Use bright outdoor lighting. Clear any walking paths of anything that might make someone trip, such as rocks or tools. Regularly check to see if handrails are loose or broken. Make sure that both sides of any steps have handrails. Any raised decks and porches should have guardrails on the edges. Have any leaves, snow, or ice cleared regularly. Use sand or salt on walking paths during winter. Clean up any  spills in your garage right away. This includes oil or grease spills. What can I do in the bathroom? Use night lights. Install grab bars by the toilet and in the tub and shower. Do not use towel bars as grab bars. Use non-skid mats or decals in the tub or shower. If you need to sit down in the shower, use a plastic, non-slip stool. Keep the floor dry. Clean up any water that spills on the floor as soon as it happens. Remove soap buildup in the tub or shower regularly. Attach bath mats securely with double-sided non-slip rug tape. Do not have throw rugs and other things on the floor that can make you trip. What can I do in the bedroom? Use night lights. Make sure that you have a light by your bed that is easy to reach. Do not use any sheets or blankets that are too big for your bed. They should not hang down onto the floor. Have a firm chair that has side arms. You can use this for support while you get dressed. Do not have throw rugs and other things on the floor that can make you trip. What can I do in the kitchen? Clean up any spills right away. Avoid walking on wet floors. Keep items that you use a lot in easy-to-reach places. If you need to reach something above you, use a strong step stool that has a grab bar. Keep electrical cords out of the way. Do not use floor polish or wax that makes floors slippery. If you must use wax, use non-skid floor wax.  Do not have throw rugs and other things on the floor that can make you trip. What can I do with my stairs? Do not leave any items on the stairs. Make sure that there are handrails on both sides of the stairs and use them. Fix handrails that are broken or loose. Make sure that handrails are as long as the stairways. Check any carpeting to make sure that it is firmly attached to the stairs. Fix any carpet that is loose or worn. Avoid having throw rugs at the top or bottom of the stairs. If you do have throw rugs, attach them to the floor  with carpet tape. Make sure that you have a light switch at the top of the stairs and the bottom of the stairs. If you do not have them, ask someone to add them for you. What else can I do to help prevent falls? Wear shoes that: Do not have high heels. Have rubber bottoms. Are comfortable and fit you well. Are closed at the toe. Do not wear sandals. If you use a stepladder: Make sure that it is fully opened. Do not climb a closed stepladder. Make sure that both sides of the stepladder are locked into place. Ask someone to hold it for you, if possible. Clearly mark and make sure that you can see: Any grab bars or handrails. First and last steps. Where the edge of each step is. Use tools that help you move around (mobility aids) if they are needed. These include: Canes. Walkers. Scooters. Crutches. Turn on the lights when you go into a dark area. Replace any light bulbs as soon as they burn out. Set up your furniture so you have a clear path. Avoid moving your furniture around. If any of your floors are uneven, fix them. If there are any pets around you, be aware of where they are. Review your medicines with your doctor. Some medicines can make you feel dizzy. This can increase your chance of falling. Ask your doctor what other things that you can do to help prevent falls. This information is not intended to replace advice given to you by your health care provider. Make sure you discuss any questions you have with your health care provider. Document Released: 07/28/2009 Document Revised: 03/08/2016 Document Reviewed: 11/05/2014 Elsevier Interactive Patient Education  2017 [employer].

## 2021-06-20 NOTE — Progress Notes (Signed)
Subjective:   Amanda Wilkins is a 75 y.o. female who presents for Medicare Annual (Subsequent) preventive examination.  Review of Systems     Cardiac Risk Factors include: advanced age (>37mn, >>77women);hypertension;obesity (BMI >30kg/m2)     Objective:    Today's Vitals   06/20/21 1258 06/20/21 1259  BP: 120/60   Pulse: 82   Temp: 98.8 F (37.1 C)   SpO2: 95%   Weight: 241 lb 12.8 oz (109.7 kg)   Height: '5\' 5"'$  (1.651 m)   PainSc: 10-Worst pain ever 10-Worst pain ever  PainLoc: Hip    Body mass index is 40.24 kg/m.  Advanced Directives 06/20/2021 05/09/2021 03/12/2021 03/10/2021 05/05/2020 05/05/2019 09/02/2018  Does Patient Have a Medical Advance Directive? Yes No No No Yes Yes Yes  Type of Advance Directive Living will;Healthcare Power of [place] Does patient want to make changes to medical advance directive? No - Patient declined - - - No - Patient declined - No - Patient declined  Copy of [place]rewin Chart? No - copy requested - - - - No - copy requested No - copy requested  Would patient like information on creating a medical advance directive? - No - Patient declined - No - Patient declined - - -    Current Medications (verified) Outpatient Encounter Medications as of 06/20/2021  Medication Sig   acetaminophen (TYLENOL) 500 MG tablet Take 1,000 mg by mouth every 8 (eight) hours as needed (pain).   ALPRAZolam (XANAX) 0.25 MG tablet Take 1 tablet (0.25 mg total) by mouth 2 (two) times daily as needed for anxiety.   apixaban (ELIQUIS) 5 MG TABS tablet Take 1 tablet (5 mg total) by mouth 2 (two) times daily.   atenolol (TENORMIN) 50 MG tablet TAKE 1 TABLET(50 MG) BY MOUTH DAILY (Patient taking differently: 25 mg 2 (two) times daily.)   cholecalciferol (VITAMIN D3) 25 MCG (1000 UT) tablet Take 1,000 Units by mouth daily.   EPINEPHrine 0.3 mg/0.3 mL IJ SOAJ injection Inject  0.3 mg into the muscle as needed for anaphylaxis. As needed for life-threatening allergic reactions   famotidine (PEPCID) 20 MG tablet Take 1 tablet (20 mg total) by mouth 2 (two) times daily.   hydroxychloroquine (PLAQUENIL) 200 MG tablet Take 200 mg by mouth every morning.    Multiple Vitamin (MULTIVITAMIN) capsule Take 1 capsule by mouth daily.   rosuvastatin (CRESTOR) 5 MG tablet Take 1 tablet (5 mg total) by mouth daily.   sertraline (ZOLOFT) 50 MG tablet Take 0.5 tablets (25 mg total) by mouth daily.   [DISCONTINUED] furosemide (LASIX) 20 MG tablet Take by mouth. (Patient not taking: Reported on 06/14/2021)   [DISCONTINUED] methylPREDNISolone (MEDROL DOSEPAK) 4 MG TBPK tablet As directed for 6 days.   No facility-administered encounter medications on file as of 06/20/2021.    Allergies (verified) Epinephrine base, Aspirin, Benadryl [diphenhydramine], Covid-19 ad26 vaccine(janssen), Fish allergy, Hylan g-f 20, Penicillins, and Tape   History: Past Medical History:  Diagnosis Date   Anticoagulant long-term use    Xarelto   Bronchitis    Chronic interstitial nephritis    Depression    Eye inflammation    History of interstitial nephritis 2016   chronic   History of nuclear stress test 12/16/2014   Intermediate risk nuclear study w/ medium size moderate severity reversible defect in the basal and mid inferolateral and inferior wall (per dr cardiology note , dr  paula ross did not think this was consistent with ischemia)/  normal LV function and wall motion, ef 75%   History of septic shock 01/22/2015   in setting Group A Strep Cellulits erysipelas/chest wall induration with streptoccocus basterium-- Severe sepsis, DIC, Acute respiratory failure with pulmonary edema, Acute Kidney failure with chronic interstitial nephritis   Hypertension    Hyponatremia    Migraines    on Zoloft for migraines   Mild carotid artery disease ([place])    per duplex 08-30-2017 bilateral ICA 1-39%   OA  (osteoarthritis) rheumotologist-  dr [name]   both knees,  shoulders, ankles   OSA on CPAP     moderate obstructive sleep apnea with an AHI of 23.4/h and oxygen desaturations as low as 84%.  Now on CPAP at 12 cm H2O.   PAF (paroxysmal atrial fibrillation) [facility]) 2009   cardiologist-- dr [name]   Paroxysmal atrial flutter [facility])    a. dx 11/2017.   PSVT (paroxysmal supraventricular tachycardia) ([place])    Wears contact lenses    Past Surgical History:  Procedure Laterality Date   BREAST BIOPSY Right 05/15/2017   [place]   ESOPHAGOGASTRODUODENOSCOPY N/A 02/08/2015   Procedure: ESOPHAGOGASTRODUODENOSCOPY (EGD);  Surgeon: [name], MD;  Location: [place];  Service: Endoscopy;  Laterality: N/A;   KNEE ARTHROSCOPY W/ MENISCAL REPAIR Left 08/2014    '@[facility]'$    SHOULDER SURGERY Right 04/04/2016   TOTAL KNEE ARTHROPLASTY Right 09/01/2018   Procedure: RIGHT TOTAL KNEE ARTHROPLASTY;  Surgeon: [name], MD;  Location: [facility];  Service: Orthopedics;  Laterality: Right;   Family History  Problem Relation Age of Onset   Pancreatic cancer Brother    Lymphoma Mother    Prostate cancer Father        metastatic prostate cancer   Breast cancer Sister 61   Allergic rhinitis Sister    Urticaria Sister    Asthma Neg Hx    Eczema Neg Hx    Immunodeficiency Neg Hx    Atopy Neg Hx    Angioedema Neg Hx    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)  Tobacco Use   Smoking status: Never   Smokeless tobacco: Never   Tobacco comments:    H/O social smoking at times when drinking--never a "habit" (in the 1970s)  Vaping Use   Vaping Use: Never used  Substance and Sexual Activity   Alcohol use: Yes     Alcohol/week: 0.0 standard drinks    Comment: Occasional   Drug use: Never   Sexual activity: Yes    Partners: Male    Birth control/protection: Post-menopausal    Comment: intercourse age 89, sexual partners more than 5  Other Topics Concern   Not on file  Social History Narrative   Not on file   Social Determinants of Health   Financial Resource Strain: Low Risk    Difficulty of Paying Living Expenses: Not hard at all  Food Insecurity: No Food Insecurity   Worried About Charity fundraiser in the Last Year: Never true   [place] in the Last Year: Never true  Transportation Needs: No Transportation  Needs   Lack of Transportation (Medical): No   Lack of Transportation (Non-Medical): No  Physical Activity: Sufficiently Active   Days of Exercise per Week: 5 days   Minutes of Exercise per Session: 30 min  Stress: No Stress Concern Present   Feeling of Stress : Not at all  Social Connections: Moderately Isolated   Frequency of Communication with Friends and Family: More than three times a week   Frequency of Social Gatherings with Friends and Family: More than three times a week   Attends Religious Services: Never   Marine scientist or Organizations: No   Attends Music therapist: Never   Marital Status: Married    Tobacco Counseling Counseling given: Not Answered Tobacco comments: H/O social smoking at times when drinking--never a "habit" (in the 1970s)   Clinical Intake:  Pre-visit preparation completed: Yes  Pain : 0-10 Pain Score: 10-Worst pain ever Pain Type: Chronic pain Pain Location: Hip Pain Orientation: Right Pain Radiating Towards: right leg Pain Descriptors / Indicators: Discomfort, Dull, Constant Pain Onset: More than a month ago Pain Frequency: Constant Pain Relieving Factors: Prednisone Effect of Pain on Daily Activities: Pain produces disability and affects the quality of life.  Pain Relieving Factors:  Prednisone  BMI - recorded: 40.24 Nutritional Status: BMI > 30  Obese Nutritional Risks: None Diabetes: No  How often do you need to have someone help you when you read instructions, pamphlets, or other written materials from your doctor or pharmacy?: 1 - Never What is the last grade level you completed in school?: High School Graduate  Diabetic? no  Interpreter Needed?: No  Information entered by :: Lisette Abu, LPN   Activities of Daily Living In your present state of health, do you have any difficulty performing the following activities: 06/20/2021  Hearing? N  Vision? N  Difficulty concentrating or making decisions? N  Walking or climbing stairs? N  Dressing or bathing? N  Doing errands, shopping? N  Preparing Food and eating ? N  Using the Toilet? N  In the past six months, have you accidently leaked urine? N  Do you have problems with loss of bowel control? N  Managing your Medications? N  Managing your Finances? N  Housekeeping or managing your Housekeeping? N  Some recent data might be hidden    Patient Care Team: Plotnikov, Evie Lacks, MD as PCP - General Fay Records, MD as PCP - Cardiology (Cardiology) Terrance Mass, MD (Inactive) as Consulting Physician (Gynecology) Leanora Cover, MD as Consulting Physician (Orthopedic Surgery) Cameron Sprang, MD as Consulting Physician (Neurology) [name], MD as Consulting Physician (Orthopedic Surgery) Regal, Tamala Fothergill, DPM as Consulting Physician (Podiatry) Royetta Crochet (Psychology) Camillo Flaming, OD as Referring Physician (Optometry)  Indicate any recent Medical Services you may have received from other than [facility] providers in the past year (date may be approximate).     Assessment:   This is a routine wellness examination for [facility].  Hearing/Vision screen Hearing Screening - Comments:: Patient denied any hearing difficulty. Vision Screening - Comments:: Patient wears contacts.  Eye exam done  annually by [place].  Dietary issues and exercise activities discussed: Current Exercise Habits: Home exercise routine, Type of exercise: walking;stretching;strength training/weights;Other - see comments (cardio), Time (Minutes): 30, Frequency (Times/Week): 5, Weekly Exercise (Minutes/Week): 150, Intensity: Moderate, Exercise limited by: orthopedic condition(s)   Goals Addressed  This Visit's Progress     Stay healthy to keep my  heart strong (pt-stated)        Continue to lose weight, eat healthy and exercise.      Depression Screen PHQ 2/9 Scores 06/20/2021 05/05/2020 05/05/2019 12/31/2016 12/31/2016 08/30/2015 07/12/2014  PHQ - 2 Score 0 0 0 1 1 0 0  PHQ- 9 Score - - 0 - - - -    Fall Risk Fall Risk  06/20/2021 05/05/2020 05/13/2019 05/05/2019 06/18/2018  Falls in the past year? 0 0 0 0 Yes  Comment - - Emmi Telephone Survey: data to providers prior to load - -  Number falls in past yr: 0 0 - 0 1  Injury with Fall? 0 0 - - Yes  Risk for fall due to : No Fall Risks Orthopedic patient - - -  Follow up Falls evaluation completed Falls evaluation completed - - -    FALL RISK PREVENTION PERTAINING TO THE HOME:  Any stairs in or around the home? Yes  If so, are there any without handrails? No  Home free of loose throw rugs in walkways, pet beds, electrical cords, etc? Yes  Adequate lighting in your home to reduce risk of falls? Yes   ASSISTIVE DEVICES UTILIZED TO PREVENT FALLS:  Life alert? No  Use of a cane, walker or w/c? No  Grab bars in the bathroom? No  Shower chair or bench in shower? No  Elevated toilet seat or a handicapped toilet? Yes   TIMED UP AND GO:  Was the test performed? Yes .  Length of time to ambulate 10 feet: 7 sec.   Gait steady and fast without use of assistive device  Cognitive Function: Normal cognitive status assessed by direct observation by this Nurse Health Advisor. No abnormalities found.       6CIT  Screen 05/05/2020  What Year? 0 points  What month? 0 points  What time? 0 points  Count back from 20 0 points  Months in reverse 0 points  Repeat phrase 0 points  Total Score 0    Immunizations Immunization History  Administered Date(s) Administered   Fluad Quad(high Dose 65+) 08/03/2019, 07/20/2020   Influenza Split 09/24/2011, 09/24/2012   Influenza Whole 08/09/2010   Influenza, High Dose Seasonal PF 08/16/2016, 07/26/2017, 07/16/2018   Influenza,inj,Quad PF,6+ Mos 07/14/2013, 07/27/2014, 06/14/2015   PFIZER(Purple Top)SARS-COV-2 Vaccination 11/05/2019, 11/26/2019, 07/09/2020, 03/09/2021   Pneumococcal Conjugate-13 05/25/2014   Pneumococcal Polysaccharide-23 06/21/2015   Tdap 01/06/2013   Zoster, Live 01/24/2011    TDAP status: Up to date  Flu Vaccine status: Up to date  Pneumococcal vaccine status: Up to date  Covid-19 vaccine status: Completed vaccines  Qualifies for Shingles Vaccine? Yes   Zostavax completed Yes   Shingrix Completed?: No.    Education has been provided regarding the importance of this vaccine. Patient has been advised to call insurance company to determine out of pocket expense if they have not yet received this vaccine. Advised may also receive vaccine at local pharmacy or Health Dept. Verbalized acceptance and understanding.  Screening Tests Health Maintenance  Topic Date Due   Zoster Vaccines- Shingrix (1 of 2) Never done   COLONOSCOPY (Pts 45-22yr Insurance coverage will need to be confirmed)  01/28/2021   INFLUENZA VACCINE  05/15/2021   COVID-19 Vaccine (5 - Booster for Pfizer series) 07/10/2021   MAMMOGRAM  09/07/2022   TETANUS/TDAP  01/07/2023   DEXA SCAN  Completed   Hepatitis C Screening  Completed  PNA vac Low Risk Adult  Completed   HPV VACCINES  Aged Out    Health Maintenance  Health Maintenance Due  Topic Date Due   Zoster Vaccines- Shingrix (1 of 2) Never done   COLONOSCOPY (Pts 45-54yr Insurance coverage will need to be  confirmed)  01/28/2021   INFLUENZA VACCINE  05/15/2021    Colorectal cancer screening: Type of screening: Colonoscopy. Completed 01/29/2011. Repeat every 10 years  Mammogram status: Completed 09/07/2020. Repeat every year  Bone Density status: Completed 05/30/2017. Results reflect: Bone density results: NORMAL. Repeat every 5 years.  Lung Cancer Screening: (Low Dose CT Chest recommended if Age 74-80years, 30 pack-year currently smoking OR have quit w/in 15years.) does not qualify.   Lung Cancer Screening Referral: no  Additional Screening:  Hepatitis C Screening: does qualify; Completed: yes  Vision Screening: Recommended annual ophthalmology exams for early detection of glaucoma and other disorders of the eye. Is the patient up to date with their annual eye exam?  Yes  Who is the provider or what is the name of the office in which the patient attends annual eye exams? Dr. TCamillo FlamingIf pt is not established with a provider, would they like to be referred to a provider to establish care? No .   Dental Screening: Recommended annual dental exams for proper oral hygiene  Community Resource Referral / Chronic Care Management: CRR required this visit?  No   CCM required this visit?  No      Plan:     I have personally reviewed and noted the following in the patient's chart:   Medical and social history Use of alcohol, tobacco or illicit drugs  Current medications and supplements including opioid prescriptions.  Functional ability and status Nutritional status Physical activity Advanced directives List of other physicians Hospitalizations, surgeries, and ER visits in previous 12 months Vitals Screenings to include cognitive, depression, and falls Referrals and appointments  In addition, I have reviewed and discussed with patient certain preventive protocols, quality metrics, and best practice recommendations. A written personalized care plan for preventive services as  well as general preventive health recommendations were provided to patient.     SSheral Flow LPN   [id number]  Nurse Notes:  Hearing Screening - Comments:: Patient denied any hearing difficulty. Vision Screening - Comments:: Patient wears contacts.  Eye exam done annually by [place]

## 2021-06-21 LAB — CORTISOL: Cortisol: 12 ug/dL

## 2021-06-26 ENCOUNTER — Ambulatory Visit (INDEPENDENT_AMBULATORY_CARE_PROVIDER_SITE_OTHER): Payer: Medicare Other | Admitting: Orthopaedic Surgery

## 2021-06-26 ENCOUNTER — Other Ambulatory Visit: Payer: Self-pay

## 2021-06-26 ENCOUNTER — Encounter: Payer: Self-pay | Admitting: Orthopaedic Surgery

## 2021-06-26 DIAGNOSIS — M25551 Pain in right hip: Secondary | ICD-10-CM

## 2021-06-26 DIAGNOSIS — M1611 Unilateral primary osteoarthritis, right hip: Secondary | ICD-10-CM | POA: Diagnosis not present

## 2021-06-26 DIAGNOSIS — I2510 Atherosclerotic heart disease of native coronary artery without angina pectoris: Secondary | ICD-10-CM | POA: Diagnosis not present
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
## 2021-06-28 DIAGNOSIS — Z23 Encounter for immunization: Secondary | ICD-10-CM | POA: Diagnosis not present

## 2021-06-30 ENCOUNTER — Telehealth: Payer: Self-pay | Admitting: Student in an Organized Health Care Education/Training Program

## 2021-06-30 NOTE — Telephone Encounter (Signed)
I was paged at 9:11pm regarding the patient having tachycardia and elevated BP. I called the patient and she noted that her HR has bounced between 70 and 150 this evening. Her HR will increase to the 150s for a few seconds then return to normal. Her SBP is also elevated to 178. She takes atenolol 25 mg BID for rate control. She elected to take a second atenolol 1/2 tablet when her Hrs jumped up to 150s, but it continues to occur. She denies symptoms. She denies chest pain, SOB, N/V, palpitations, PND, orthopnea, syncope or presyncope. Given that she is asymptomatic and is not having a sustained arrhythmia, I do not feel that she needs to come to the ED at this time. I instructed her to take 50 mg of atenolol in the AM and 25 mg at PM for more rate control. I further instructed her to speak with her primary cardiologist in the AM. I gave her instructions to present to the ED if she develops chest pain, SOB, or sustained tachycardia. She verbalized an understanding.

## 2021-07-02 ENCOUNTER — Other Ambulatory Visit: Payer: Self-pay | Admitting: Internal Medicine

## 2021-07-02 DIAGNOSIS — Z20822 Contact with and (suspected) exposure to covid-19: Secondary | ICD-10-CM | POA: Diagnosis not present

## 2021-07-03 ENCOUNTER — Telehealth: Payer: Self-pay | Admitting: Internal Medicine

## 2021-07-03 NOTE — Telephone Encounter (Signed)
Pt spoke w on call physician (Dr. Hershal Coria) on 9/16 for this and was instructed to increase AM dose of atenolol to 50 mg, continue with 25 mg in PM for more rate control and follow up with primary cardiologist.  I will forward to Dr. [name] for review and recommendations.

## 2021-07-03 NOTE — Telephone Encounter (Signed)
STAT if HR is under 50 or over 120 (normal HR is 60-100 beats per minute)  What is your heart rate? 98  Do you have a log of your heart rate readings (document readings)?  Ranging 160-90's  Do you have any other symptoms? No   First episode 3 days ago.

## 2021-07-04 ENCOUNTER — Ambulatory Visit (INDEPENDENT_AMBULATORY_CARE_PROVIDER_SITE_OTHER): Payer: Medicare Other

## 2021-07-04 ENCOUNTER — Other Ambulatory Visit: Payer: Self-pay

## 2021-07-04 ENCOUNTER — Ambulatory Visit (INDEPENDENT_AMBULATORY_CARE_PROVIDER_SITE_OTHER): Payer: Medicare Other | Admitting: Physician Assistant

## 2021-07-04 ENCOUNTER — Encounter: Payer: Self-pay | Admitting: Physician Assistant

## 2021-07-04 VITALS — BP 144/86 | HR 70 | Ht 65.0 in | Wt 239.4 lb

## 2021-07-04 DIAGNOSIS — E785 Hyperlipidemia, unspecified: Secondary | ICD-10-CM

## 2021-07-04 DIAGNOSIS — R42 Dizziness and giddiness: Secondary | ICD-10-CM | POA: Diagnosis not present

## 2021-07-04 DIAGNOSIS — I10 Essential (primary) hypertension: Secondary | ICD-10-CM | POA: Diagnosis not present

## 2021-07-04 DIAGNOSIS — I2510 Atherosclerotic heart disease of native coronary artery without angina pectoris: Secondary | ICD-10-CM

## 2021-07-04 DIAGNOSIS — I480 Paroxysmal atrial fibrillation: Secondary | ICD-10-CM

## 2021-07-04 DIAGNOSIS — I5032 Chronic diastolic (congestive) heart failure: Secondary | ICD-10-CM | POA: Diagnosis not present

## 2021-07-04 NOTE — Addendum Note (Signed)
Addended by: Mendel Ryder on: 07/04/2021 01:30 PM   Modules accepted: Orders

## 2021-07-04 NOTE — Progress Notes (Signed)
Cardiology Office Note    Date:  07/04/2021   ID:  Gionni Freese, DOB [birth year], MRN [id number]   PCP:  [name], MD   [place]  Cardiologist:  [name], MD   Advanced Practice Provider:  No care team member to display Electrophysiologist:  None   [id number]}   Chief Complaint  Patient presents with   Palpitations     History of Present Illness:  Amanda Wilkins is a 75 y.o. female with history of HTN, HLD, mild MR,PAF has declined flecainide in the past, near syncope, chest pain with calcium score of 0 in 9373, diastolic CHF, hypertension, chronic dyspnea with normal PFTs.  Normal LVEF on echo 2021, CPX done limited due to habitus/weight. Recommended exercise training.  Patient last saw Dr. [name] 05/10/2021 at which time she had tachypalpitations most likely atrial fibrillation converted on her own.  No changes made.  Patient developed some dizziness and atenolol was decreased to 25 mg twice daily.  Patient added onto my schedule today because of recurrent palpitations.  PCP told her to increase her morning dose of atenolol to 50 mg in evening 25 mg. Patient says she gets dizzy any time she goes for a drive and then gets out and stands up. Friday HR 80-160's, felt uncomfortable but no chest pain or dizziness. Lasted 4 hrs. Yesterday had 3 episodes lasting 15 min. Drinks 2 cups of decaf. She stopped drinking alcohol in August. She had one glass of wine Friday night before this started but none since. No decongestants. Going to [place] in December and concerned about this happening while there.  Past Medical History:  Diagnosis Date   Anticoagulant long-term use    Xarelto   Bronchitis    Chronic interstitial nephritis    Depression    Eye inflammation    History of interstitial nephritis 2016   chronic   History of nuclear stress test 12/16/2014   Intermediate risk nuclear study w/ medium size moderate severity reversible  defect in the basal and mid inferolateral and inferior wall (per dr cardiology note , dr [name] did not think this was consistent with ischemia)/  normal LV function and wall motion, ef 75%   History of septic shock 01/22/2015   in setting Group A Strep Cellulits erysipelas/chest wall induration with streptoccocus basterium-- Severe sepsis, DIC, Acute respiratory failure with pulmonary edema, Acute Kidney failure with chronic interstitial nephritis   Hypertension    Hyponatremia    Migraines    on Zoloft for migraines   Mild carotid artery disease ([place])    per duplex 08-30-2017 bilateral ICA 1-39%   OA (osteoarthritis) rheumotologist-  dr [name]   both knees,  shoulders, ankles   OSA on CPAP     moderate obstructive sleep apnea with an AHI of 23.4/h and oxygen desaturations as low as 84%.  Now on CPAP at 12 cm H2O.   PAF (paroxysmal atrial fibrillation) [facility]) 2009   cardiologist-- dr [name]   Paroxysmal atrial flutter [facility])    a. dx 11/2017.   PSVT (paroxysmal supraventricular tachycardia) ([place])    Wears contact lenses     Past Surgical History:  Procedure Laterality Date   BREAST BIOPSY Right 05/15/2017   [place]   ESOPHAGOGASTRODUODENOSCOPY N/A 02/08/2015   Procedure: ESOPHAGOGASTRODUODENOSCOPY (EGD);  Surgeon: [name], MD;  Location: [facility];  Service: Endoscopy;  Laterality: N/A;   KNEE ARTHROSCOPY W/ MENISCAL REPAIR Left  08/2014    @[facility]    SHOULDER SURGERY Right 04/04/2016   TOTAL KNEE ARTHROPLASTY Right 09/01/2018   Procedure: RIGHT TOTAL KNEE ARTHROPLASTY;  Surgeon: [name], MD;  Location: [facility];  Service: Orthopedics;  Laterality: Right;    Current Medications: Current Meds  Medication Sig   acetaminophen (TYLENOL) 500 MG tablet Take 1,000 mg by mouth every 8 (eight) hours as needed (pain).   ALPRAZolam (XANAX) 0.25 MG tablet Take 1 tablet (0.25 mg total) by mouth 2 (two) times daily as needed for anxiety.    apixaban (ELIQUIS) 5 MG TABS tablet Take 1 tablet (5 mg total) by mouth 2 (two) times daily.   atenolol (TENORMIN) 50 MG tablet TAKE 1 TABLET(50 MG) BY MOUTH DAILY   cholecalciferol (VITAMIN D3) 25 MCG (1000 UT) tablet Take 1,000 Units by mouth daily.   EPINEPHrine 0.3 mg/0.3 mL IJ SOAJ injection Inject 0.3 mg into the muscle as needed for anaphylaxis. As needed for life-threatening allergic reactions   hydroxychloroquine (PLAQUENIL) 200 MG tablet Take 200 mg by mouth every morning.    Multiple Vitamin (MULTIVITAMIN) capsule Take 1 capsule by mouth daily.   rosuvastatin (CRESTOR) 5 MG tablet Take 1 tablet (5 mg total) by mouth daily.   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)  Tobacco Use   Smoking status: Never   Smokeless tobacco: Never   Tobacco comments:    H/O social smoking at times when drinking--never a "habit" (in the 1970s)  Vaping Use   Vaping Use: Never used  Substance and Sexual Activity   Alcohol use: Yes    Alcohol/week: 0.0 standard drinks    Comment: Occasional   Drug use: Never   Sexual activity: Yes    Partners: Male    Birth control/protection: Post-menopausal    Comment: intercourse age 12, sexual partners more than 5  Other Topics Concern   Not on file  Social History Narrative   Not on file   Social Determinants of Health   Financial Resource Strain: Low Risk    Difficulty of Paying Living Expenses: Not hard at all  Food Insecurity: No Food Insecurity   Worried About Paediatric nurse in the Last Year: Never true   [place] in the Last Year: Never true  Transportation Needs: No Transportation Needs   Lack of Transportation (Medical): No   Lack of Transportation (Non-Medical): No  Physical Activity: Sufficiently Active   Days of Exercise per Week: 5 days   Minutes of Exercise per Session: 30 min  Stress: No Stress Concern Present   Feeling of Stress : Not at all  Social Connections: Moderately Isolated   Frequency of Communication with Friends and Family: More than three times a week   Frequency of Social Gatherings with Friends and Family: More than three times a week   Attends Religious Services: Never   Marine scientist or Organizations: No   Attends Archivist Meetings: Never  Marital Status: Married     Family History:  The patient's  family history includes Allergic rhinitis in her sister; Breast cancer (age of onset: 49) in her sister; Lymphoma in her mother; Pancreatic cancer in her brother; Prostate cancer in her father; Urticaria in her sister.   ROS:   Please see the history of present illness.    ROS All other systems reviewed and are negative.   PHYSICAL EXAM:   VS:  BP (!) 144/86   Pulse 70   Ht 5\' 5"  (1.651 m)   Wt 239 lb 6.4 oz (108.6 kg)   SpO2 99%   BMI 39.84 kg/m   Physical Exam  GEN: Obese, in no acute distress  Neck: no JVD, carotid bruits, or masses Cardiac:RRR; no murmurs, rubs, or gallops  Respiratory:  clear to auscultation bilaterally, normal work of breathing GI: soft, nontender, nondistended, + BS Ext: without cyanosis, clubbing, or edema, Good distal pulses bilaterally Neuro:  Alert and Oriented x 3 Psych: euthymic mood, full affect  Wt Readings from Last 3 Encounters:  07/04/21 239 lb 6.4 oz (108.6 kg)  06/20/21 241 lb 12.8 oz (109.7 kg)  06/20/21 241 lb (109.3 kg)      Studies/Labs Reviewed:   EKG:  EKG is  ordered today.  The ekg ordered today demonstrates NSR with PAC normal  EKG  Recent Labs: 11/04/2020: TSH 3.140 05/09/2021: ALT 33; BUN 24; Creatinine, Ser 0.68; Hemoglobin 13.1; Magnesium 2.2; Platelets 194; Potassium 4.3; Sodium 129   Lipid Panel    Component Value Date/Time   CHOL 175 11/04/2020 1224   TRIG 69 11/04/2020 1224   HDL 102 11/04/2020 1224   CHOLHDL 1.7 11/04/2020 1224   CHOLHDL 2 05/28/2018 0837   VLDL 13.8 05/28/2018 0837   LDLCALC 60 11/04/2020 1224    Additional studies/ records that were reviewed today include:  Echo 12/09/19  1. Left ventricular ejection fraction, by estimation, is 55 to 60%. The left ventricle has normal function. The left ventricle has no regional wall motion abnormalities. There is mild concentric left ventricular hypertrophy. Left ventricular diastolic parameters were normal. 2. Right ventricular systolic function is normal. The right ventricular size is normal. There is moderately elevated pulmonary artery systolic pressure. 3. The mitral valve is normal in structure and function. Mild mitral valve regurgitation. No evidence of mitral stenosis. 4. The aortic valve is tricuspid. Aortic valve regurgitation is not visualized. No aortic stenosis is present. 5. The inferior vena cava is normal in size with <50% respiratory variability, suggesting right atrial pressure of 8 mmHg. Comparison(s): 01/25/15 EF 55-60%. PA pressure 2mmHg. Left Ventricle: Left ventricular ejection fraction, by estimation, is 55 to 60%. The left ventricle has normal function. The left ventricle has no regional wall motion abnormalities. The left ventricular internal cavity size was normal in size. There is mild concentric left ventricular hypertrophy. Left ventricular diastolic parameters were normal. Right Ventricle: The right ventricular size is normal. No increase in right ventricular wall thickness. Right ventricular systolic function is normal. There is moderately elevated pulmonary artery systolic pressure. The tricuspid regurgitant  velocity is 3.11 m/s, and with an assumed right atrial pressure of 8 mmHg, the estimated right ventricular systolic pressure is 17.0 mmHg.  Risk Assessment/Calculations:    CHA2DS2-VASc Score = 4   This indicates a 4.8% annual risk of stroke. The patient's score is based upon: CHF History: 1 HTN History: 1 Diabetes History: 0 Stroke History: 0 Vascular Disease History: 0 Age Score: 1 Gender Score: 1  ASSESSMENT:    1. PAF (paroxysmal atrial fibrillation) ([place])   2. Dizziness   3. Chronic diastolic CHF (congestive heart failure) ([place])   4. Essential hypertension   5. Hyperlipidemia, unspecified hyperlipidemia type      PLAN:  In order of problems listed above:  PAF on Xarelto, atenolol recently decreased because of dizziness and low blood pressures and now having more breakthrough Afib. Alcohol seems to be a trigger but still had it 3 times yesterday without any. Will place 2 week monitor and  refer to Afib clinic  Dizziness when changing position. Not significantly orthostatic here. BP does drop 141 to 132 lying to sitting but then stable.  Chronic diastolic CHF compensated without edema  Hypertension controlled but goes up when in Afib  Hyperlipidemia on statin    Shared Decision Making/Informed Consent        Medication Adjustments/Labs and Tests Ordered: Current medicines are reviewed at length with the patient today.  Concerns regarding medicines are outlined above.  Medication changes, Labs and Tests ordered today are listed in the Patient Instructions below. Patient Instructions  Medication Instructions:  Your physician recommends that you continue on your current medications as directed. Please refer to the Current Medication list given to you today.  *If you need a refill on your cardiac medications before your next appointment, please call your pharmacy*   Lab Work: Cmp, Cbc, Tsh- Today   If you have labs (blood work) drawn today and your  tests are completely normal, you will receive your results only by: [place] (if you have MyChart) OR A paper copy in the mail If you have any lab test that is abnormal or we need to change your treatment, we will call you to review the results.   Testing/Procedures: A zio monitor was ordered today. It will remain on for 14 days. You will then return monitor and event diary in provided box. It takes 1-2 weeks for report to be downloaded and returned to [place]. We will call you with the results. If monitor falls off or has orange flashing light, please call Zio for further instructions.     Follow-Up: At [facility], you and your health needs are our priority.  As part of our continuing mission to provide you with exceptional heart care, we have created designated Provider Care Teams.  These Care Teams include your primary Cardiologist (physician) and Advanced Practice Providers (APPs -  Physician Assistants and Nurse Practitioners) who all work together to provide you with the care you need, when you need it.  We recommend signing up for the patient portal called "MyChart".  Sign up information is provided on this After Visit Summary.  MyChart is used to connect with patients for Virtual Visits (Telemedicine).  Patients are able to view lab/test results, encounter notes, upcoming appointments, etc.  Non-urgent messages can be sent to your provider as well.   To learn more about what you can do with MyChart, go to [email or web address].    Your next appointment:   3 month(s)  The format for your next appointment:   In Person  Provider:   You may see [name], MD or one of the following Advanced Practice Providers on your designated Care Team:   Richardson Dopp, PA-C Robbie Lis, [place]  Your physician has referred you to the A-Fib clinic   Other Instructions ZIO XT- Long Term Monitor Instructions  Your physician has requested you wear a ZIO patch monitor for 14 days.  This  is a  single patch monitor. [employer] supplies one patch monitor per enrollment. Additional stickers are not available. Please do not apply patch if you will be having a Nuclear Stress Test,  Echocardiogram, Cardiac CT, MRI, or Chest Xray during the period you would be wearing the  monitor. The patch cannot be worn during these tests. You cannot remove and re-apply the  ZIO XT patch monitor.  Your ZIO patch monitor will be mailed 3 day USPS to your address on file. It may take 3-5 days  to receive your monitor after you have been enrolled.  Once you have received your monitor, please review the enclosed instructions. Your monitor  has already been registered assigning a specific monitor serial # to you.  Billing and Patient Assistance Program Information  We have supplied [employer] with any of your insurance information on file for billing purposes. [employer] offers a sliding scale Patient Assistance Program for patients that do not have  insurance, or whose insurance does not completely cover the cost of the ZIO monitor.  You must apply for the Patient Assistance Program to qualify for this discounted rate.  To apply, please call [employer] at [phone], select option 4, select option 2, ask to apply for  Patient Assistance Program. Theodore Demark will ask your household income, and how many people  are in your household. They will quote your out-of-pocket cost based on that information.  [employer] will also be able to set up a 14-month, interest-free payment plan if needed.  Applying the monitor   Shave hair from upper left chest.  Hold abrader disc by orange tab. Rub abrader in 40 strokes over the upper left chest as  indicated in your monitor instructions.  Clean area with 4 enclosed alcohol pads. Let dry.  Apply patch as indicated in monitor instructions. Patch will be placed under collarbone on left  side of chest with arrow pointing upward.  Rub patch adhesive wings for 2 minutes. Remove white label  marked "1". Remove the white  label marked "2". Rub patch adhesive wings for 2 additional minutes.  While looking in a mirror, press and release button in center of patch. A small green light will  flash 3-4 times. This will be your only indicator that the monitor has been turned on.  Do not shower for the first 24 hours. You may shower after the first 24 hours.  Press the button if you feel a symptom. You will hear a small click. Record Date, Time and  Symptom in the Patient Logbook.  When you are ready to remove the patch, follow instructions on the last 2 pages of Patient  Logbook. Stick patch monitor onto the last page of Patient Logbook.  Place Patient Logbook in the blue and white box. Use locking tab on box and tape box closed  securely. The blue and white box has prepaid postage on it. Please place it in the mailbox as  soon as possible. Your physician should have your test results approximately 7 days after the  monitor has been mailed back to [facility].  Call [place] at [phone] if you have questions regarding  your ZIO XT patch monitor. Call them immediately if you see an orange light blinking on your  monitor.  If your monitor falls off in less than 4 days, contact our Monitor department at [phone].  If your monitor becomes loose or falls off after 4 days call [employer] at [phone] for  suggestions on securing your monitor  Sumner Boast, PA-C  07/04/2021 1:18 PM    [facility] [address], [place], [place]  [address] Phone: [phone]; Fax: [phone]

## 2021-07-04 NOTE — Progress Notes (Unsigned)
Applied a 14 day Zio Xt monitor to patient in the office  Dr [name] to read

## 2021-07-04 NOTE — Patient Instructions (Addendum)
Medication Instructions:  Your physician recommends that you continue on your current medications as directed. Please refer to the Current Medication list given to you today.  *If you need a refill on your cardiac medications before your next appointment, please call your pharmacy*   Lab Work: Cmp, Cbc, Tsh- Today   If you have labs (blood work) drawn today and your tests are completely normal, you will receive your results only by: [place] (if you have MyChart) OR A paper copy in the mail If you have any lab test that is abnormal or we need to change your treatment, we will call you to review the results.   Testing/Procedures: A zio monitor was ordered today. It will remain on for 14 days. You will then return monitor and event diary in provided box. It takes 1-2 weeks for report to be downloaded and returned to [place]. We will call you with the results. If monitor falls off or has orange flashing light, please call Zio for further instructions.     Follow-Up: At [facility], you and your health needs are our priority.  As part of our continuing mission to provide you with exceptional heart care, we have created designated Provider Care Teams.  These Care Teams include your primary Cardiologist (physician) and Advanced Practice Providers (APPs -  Physician Assistants and Nurse Practitioners) who all work together to provide you with the care you need, when you need it.  We recommend signing up for the patient portal called "MyChart".  Sign up information is provided on this After Visit Summary.  MyChart is used to connect with patients for Virtual Visits (Telemedicine).  Patients are able to view lab/test results, encounter notes, upcoming appointments, etc.  Non-urgent messages can be sent to your provider as well.   To learn more about what you can do with MyChart, go to [email or web address].    Your next appointment:   3 month(s)  The format for your next appointment:   In  Person  Provider:   You may see [name], MD or one of the following Advanced Practice Providers on your designated Care Team:   Richardson Dopp, PA-C Robbie Lis, [place]  Your physician has referred you to the A-Fib clinic   Other Instructions ZIO XT- Long Term Monitor Instructions  Your physician has requested you wear a ZIO patch monitor for 14 days.  This is a single patch monitor. [employer] supplies one patch monitor per enrollment. Additional stickers are not available. Please do not apply patch if you will be having a Nuclear Stress Test,  Echocardiogram, Cardiac CT, MRI, or Chest Xray during the period you would be wearing the  monitor. The patch cannot be worn during these tests. You cannot remove and re-apply the  ZIO XT patch monitor.  Your ZIO patch monitor will be mailed 3 day USPS to your address on file. It may take 3-5 days  to receive your monitor after you have been enrolled.  Once you have received your monitor, please review the enclosed instructions. Your monitor  has already been registered assigning a specific monitor serial # to you.  Billing and Patient Assistance Program Information  We have supplied [employer] with any of your insurance information on file for billing purposes. [employer] offers a sliding scale Patient Assistance Program for patients that do not have  insurance, or whose insurance does not completely cover the cost of the ZIO monitor.  You must apply for the Patient Assistance Program to qualify for  this discounted rate.  To apply, please call [employer] at [phone], select option 4, select option 2, ask to apply for  Patient Assistance Program. Theodore Demark will ask your household income, and how many people  are in your household. They will quote your out-of-pocket cost based on that information.  [employer] will also be able to set up a 52-month, interest-free payment plan if needed.  Applying the monitor   Shave hair from upper left chest.  Hold  abrader disc by orange tab. Rub abrader in 40 strokes over the upper left chest as  indicated in your monitor instructions.  Clean area with 4 enclosed alcohol pads. Let dry.  Apply patch as indicated in monitor instructions. Patch will be placed under collarbone on left  side of chest with arrow pointing upward.  Rub patch adhesive wings for 2 minutes. Remove white label marked "1". Remove the white  label marked "2". Rub patch adhesive wings for 2 additional minutes.  While looking in a mirror, press and release button in center of patch. A small green light will  flash 3-4 times. This will be your only indicator that the monitor has been turned on.  Do not shower for the first 24 hours. You may shower after the first 24 hours.  Press the button if you feel a symptom. You will hear a small click. Record Date, Time and  Symptom in the Patient Logbook.  When you are ready to remove the patch, follow instructions on the last 2 pages of Patient  Logbook. Stick patch monitor onto the last page of Patient Logbook.  Place Patient Logbook in the blue and white box. Use locking tab on box and tape box closed  securely. The blue and white box has prepaid postage on it. Please place it in the mailbox as  soon as possible. Your physician should have your test results approximately 7 days after the  monitor has been mailed back to [facility].  Call [place] at [phone] if you have questions regarding  your ZIO XT patch monitor. Call them immediately if you see an orange light blinking on your  monitor.  If your monitor falls off in less than 4 days, contact our Monitor department at [phone].  If your monitor becomes loose or falls off after 4 days call [employer] at [phone] for  suggestions on securing your monitor

## 2021-07-05 ENCOUNTER — Other Ambulatory Visit: Payer: Medicare Other | Admitting: *Deleted

## 2021-07-05 DIAGNOSIS — E785 Hyperlipidemia, unspecified: Secondary | ICD-10-CM | POA: Diagnosis not present

## 2021-07-05 DIAGNOSIS — Z79899 Other long term (current) drug therapy: Secondary | ICD-10-CM | POA: Diagnosis not present

## 2021-07-05 DIAGNOSIS — E669 Obesity, unspecified: Secondary | ICD-10-CM | POA: Diagnosis not present

## 2021-07-05 DIAGNOSIS — M2550 Pain in unspecified joint: Secondary | ICD-10-CM | POA: Diagnosis not present

## 2021-07-05 DIAGNOSIS — I480 Paroxysmal atrial fibrillation: Secondary | ICD-10-CM | POA: Diagnosis not present

## 2021-07-05 DIAGNOSIS — Z6839 Body mass index (BMI) 39.0-39.9, adult: Secondary | ICD-10-CM | POA: Diagnosis not present

## 2021-07-05 DIAGNOSIS — M0609 Rheumatoid arthritis without rheumatoid factor, multiple sites: Secondary | ICD-10-CM | POA: Diagnosis not present

## 2021-07-05 DIAGNOSIS — M150 Primary generalized (osteo)arthritis: Secondary | ICD-10-CM | POA: Diagnosis not present

## 2021-07-05 LAB — COMPREHENSIVE METABOLIC PANEL
ALT: 24 IU/L (ref 0–32)
AST: 24 IU/L (ref 0–40)
Albumin/Globulin Ratio: 2.4 — ABNORMAL HIGH (ref 1.2–2.2)
Albumin: 4.5 g/dL (ref 3.7–4.7)
Alkaline Phosphatase: 79 IU/L (ref 44–121)
BUN/Creatinine Ratio: 17 (ref 12–28)
BUN: 13 mg/dL (ref 8–27)
Bilirubin Total: 0.5 mg/dL (ref 0.0–1.2)
CO2: 22 mmol/L (ref 20–29)
Calcium: 9.5 mg/dL (ref 8.7–10.3)
Chloride: 95 mmol/L — ABNORMAL LOW (ref 96–106)
Creatinine, Ser: 0.78 mg/dL (ref 0.57–1.00)
Globulin, Total: 1.9 g/dL (ref 1.5–4.5)
Glucose: 85 mg/dL (ref 65–99)
Potassium: 4.5 mmol/L (ref 3.5–5.2)
Sodium: 133 mmol/L — ABNORMAL LOW (ref 134–144)
Total Protein: 6.4 g/dL (ref 6.0–8.5)
eGFR: 80 mL/min/{1.73_m2} (ref >59)

## 2021-07-05 LAB — CBC
Hematocrit: 36.3 % (ref 34.0–46.6)
Hemoglobin: 12.3 g/dL (ref 11.1–15.9)
MCH: 32.6 pg (ref 26.6–33.0)
MCHC: 33.9 g/dL (ref 31.5–35.7)
MCV: 96 fL (ref 79–97)
Platelets: 186 10*3/uL (ref 150–450)
RBC: 3.77 x10E6/uL (ref 3.77–5.28)
RDW: 12.1 % (ref 11.7–15.4)
WBC: 5.1 10*3/uL (ref 3.4–10.8)

## 2021-07-05 LAB — TSH: TSH: 2.38 u[IU]/mL (ref 0.450–4.500)

## 2021-07-06 ENCOUNTER — Encounter: Payer: Self-pay | Admitting: Physical Medicine and Rehabilitation

## 2021-07-06 ENCOUNTER — Other Ambulatory Visit: Payer: Self-pay

## 2021-07-06 ENCOUNTER — Ambulatory Visit (INDEPENDENT_AMBULATORY_CARE_PROVIDER_SITE_OTHER): Payer: Medicare Other | Admitting: Physical Medicine and Rehabilitation

## 2021-07-06 ENCOUNTER — Ambulatory Visit: Payer: Self-pay

## 2021-07-06 DIAGNOSIS — M25551 Pain in right hip: Secondary | ICD-10-CM

## 2021-07-06 NOTE — Addendum Note (Signed)
Addended by: Gar Ponto on: 07/06/2021 09:00 AM   Modules accepted: Orders

## 2021-07-06 NOTE — Progress Notes (Signed)
Pt state right hip pain. Pt state she feel pain on the inner and outer thigh and her knee. Pt state walking, sitting and laying down makes the pain worse.Pt state she takes over the counter pain meds and ice to help ease his pain.  Numeric Pain Rating Scale and Functional Assessment Average Pain 3   In the last MONTH (on 0-10 scale) has pain interfered with the following?  1. General activity like being  able to carry out your everyday physical activities such as walking, climbing stairs, carrying groceries, or moving a chair?  Rating(10)    +BT, -Dye Allergies.

## 2021-07-06 NOTE — Telephone Encounter (Signed)
Seen by APP w monitor and follow up w afib clinic.

## 2021-07-14 MED ORDER — TRIAMCINOLONE ACETONIDE 40 MG/ML IJ SUSP
60.0000 mg | INTRAMUSCULAR | Status: AC | PRN
  Administered 2021-07-06: 60 mg via INTRA_ARTICULAR

## 2021-07-14 MED ORDER — BUPIVACAINE HCL 0.25 % IJ SOLN
4.0000 mL | INTRAMUSCULAR | Status: AC | PRN
  Administered 2021-07-06: 4 mL via INTRA_ARTICULAR

## 2021-07-14 NOTE — Progress Notes (Signed)
   Amanda Wilkins - 75 y.o. female MRN [id number]  Date of birth: 04-Aug-1946  Office Visit Note: Visit Date: 07/06/2021 PCP: [name], MD Referred by: [name], MD  Subjective: Chief Complaint  Patient presents with   Right Hip - Pain   Right Thigh - Pain   Right Knee - Pain   HPI:  Amanda Wilkins is a 75 y.o. female who comes in today at the request of Dr. Jean Rosenthal for planned Right anesthetic hip arthrogram with fluoroscopic guidance.  The patient has failed conservative care including home exercise, medications, time and activity modification.  This injection will be diagnostic and hopefully therapeutic.  Please see requesting physician notes for further details and justification.   ROS Otherwise per HPI.  Assessment & Plan: Visit Diagnoses:    ICD-10-CM   1. Pain in right hip  M25.551 XR C-ARM NO REPORT      Plan: No additional findings.   Meds & Orders: No orders of the defined types were placed in this encounter.   Orders Placed This Encounter  Procedures   Large Joint Inj   XR C-ARM NO REPORT    Follow-up: Return for visit to requesting physician as needed.   Procedures: Large Joint Inj: R hip joint on 07/06/2021 10:15 AM Indications: diagnostic evaluation and pain Details: 22 G 3.5 in needle, fluoroscopy-guided anterior approach  Arthrogram: No  Medications: 4 mL bupivacaine 0.25 %; 60 mg triamcinolone acetonide 40 MG/ML Outcome: tolerated well, no immediate complications  There was excellent flow of contrast producing a partial arthrogram of the hip. The patient did have some relief of symptoms during the anesthetic phase of the injection. Procedure, treatment alternatives, risks and benefits explained, specific risks discussed. Consent was given by the patient. Immediately prior to procedure a time out was called to verify the correct patient, procedure, equipment, support staff and site/side marked as required.  Patient was prepped and draped in the usual sterile fashion.         Clinical History: No specialty comments available.     Objective:  VS:  HT:    WT:   BMI:     BP:   HR: bpm  TEMP: ( )  RESP:  Physical Exam   Imaging: No results found.

## 2021-07-19 DIAGNOSIS — I480 Paroxysmal atrial fibrillation: Secondary | ICD-10-CM | POA: Diagnosis not present

## 2021-07-20 ENCOUNTER — Telehealth: Payer: Self-pay | Admitting: *Deleted

## 2021-07-20 NOTE — Telephone Encounter (Signed)
Called pt to go over some lab results.  Pt asked to make an appointment with Dr. Radford Pax for her yearly sleep follow-up. Only seen a virtual appointment available, but wasn't sure of the protocol for Dr. Theodosia Blender sleep appointments.  Will send to Gershon Cull, Sleep Coordinator and Sharlene Dory, RN, to assist and pt aware someone will call her back.

## 2021-07-21 NOTE — Telephone Encounter (Signed)
Called patient and made her a video visit appointment for 09/12/21.

## 2021-07-26 ENCOUNTER — Encounter: Payer: Self-pay | Admitting: Orthopaedic Surgery

## 2021-07-26 ENCOUNTER — Ambulatory Visit (INDEPENDENT_AMBULATORY_CARE_PROVIDER_SITE_OTHER): Payer: Medicare Other | Admitting: Orthopaedic Surgery

## 2021-07-26 ENCOUNTER — Ambulatory Visit: Payer: Self-pay

## 2021-07-26 ENCOUNTER — Other Ambulatory Visit: Payer: Self-pay

## 2021-07-26 VITALS — Ht 65.0 in | Wt 243.0 lb

## 2021-07-26 DIAGNOSIS — I2510 Atherosclerotic heart disease of native coronary artery without angina pectoris: Secondary | ICD-10-CM | POA: Diagnosis not present

## 2021-07-26 DIAGNOSIS — M25551 Pain in right hip: Secondary | ICD-10-CM

## 2021-07-26 DIAGNOSIS — Z96651 Presence of right artificial knee joint: Secondary | ICD-10-CM

## 2021-07-26 NOTE — Progress Notes (Signed)
The patient is being seen for a second time by me.  I will send her to Dr. Ernestina Patches for an intra-articular injection with steroid under fluoroscopy and her right hip.  She said that really did not help her much.  She does have a history of a right total knee arthroplasty was done by one of my colleagues in town in 2019.  She does get thigh pain and knee pain as well.  The right hip does move smoothly and fluidly with some pain in the groin.  She has a lot of pain in her thigh muscle and around her quads.  Her extensor mechanism of the right knee is intact.  2 views of the right knee show well-seated implant with no complicating features.  Her knee feels ligamentously stable with good range of motion.  At this point I would like to obtain a MRI of her right hip and the proximal thigh to look at the thigh muscles as well as the cartilage around her right hip.  I think this is mainly the source of her issues.  I have still advocated weight loss given her BMI of 40.44.  She is walking without assist device and is very active and young appearing 33 so our goal is to try to get her feeling better.  We will see her back once we have these MRIs.

## 2021-07-27 ENCOUNTER — Encounter (HOSPITAL_COMMUNITY): Payer: Self-pay | Admitting: Nurse Practitioner

## 2021-07-27 ENCOUNTER — Ambulatory Visit (HOSPITAL_COMMUNITY)
Admission: RE | Admit: 2021-07-27 | Discharge: 2021-07-27 | Disposition: A | Discharge status: Home / Self Care | Payer: Medicare Other | Source: Ambulatory Visit | Attending: Nurse Practitioner | Admitting: Nurse Practitioner

## 2021-07-27 ENCOUNTER — Other Ambulatory Visit: Payer: Self-pay | Admitting: Obstetrics & Gynecology

## 2021-07-27 ENCOUNTER — Other Ambulatory Visit: Payer: Self-pay

## 2021-07-27 VITALS — BP 154/76 | HR 62 | Ht 65.0 in | Wt 241.2 lb

## 2021-07-27 DIAGNOSIS — G4733 Obstructive sleep apnea (adult) (pediatric): Secondary | ICD-10-CM | POA: Insufficient documentation

## 2021-07-27 DIAGNOSIS — I10 Essential (primary) hypertension: Secondary | ICD-10-CM | POA: Insufficient documentation

## 2021-07-27 DIAGNOSIS — I480 Paroxysmal atrial fibrillation: Secondary | ICD-10-CM | POA: Diagnosis not present

## 2021-07-27 DIAGNOSIS — Z7901 Long term (current) use of anticoagulants: Secondary | ICD-10-CM | POA: Insufficient documentation

## 2021-07-27 DIAGNOSIS — Z79899 Other long term (current) drug therapy: Secondary | ICD-10-CM | POA: Diagnosis not present

## 2021-07-27 DIAGNOSIS — Z560 Unemployment, unspecified: Secondary | ICD-10-CM | POA: Insufficient documentation

## 2021-07-27 DIAGNOSIS — D6869 Other thrombophilia: Secondary | ICD-10-CM | POA: Diagnosis not present

## 2021-07-27 DIAGNOSIS — Z1231 Encounter for screening mammogram for malignant neoplasm of breast: Secondary | ICD-10-CM

## 2021-07-27 MED ORDER — ATENOLOL 50 MG PO TABS: ORAL_TABLET | ORAL | Status: DC

## 2021-07-27 NOTE — Progress Notes (Signed)
Primary Care Physician: [name], MD Referring Physician: Dr. Lucienne Capers   Amanda Wilkins is a 75 y.o. female with a h/o paroxysmal afib that is in the afib clinic for evaluation. She was seen by Ermalinda Barrios on an urgent visit after c/o palpitations. A monitor was placed and she atenolol was increased to add 25 mg at hs. Her monitor showed short burst of SVT but no afib.  She states that she and her husband was consuming  a couple of glasses of wine every evening for some time until they thought they were drinking too much and stopped. The last episode she had of irregular heart beat that prompted the phone call to be seen for palpitations, was after having alcohol the evening before. Now with avoiding alcohol and the increase of atenolol, her heart rhythm has not been a problem lately. She is using CPAP religiously.   Today, she denies symptoms of palpitations, chest pain, shortness of breath, orthopnea, PND, lower extremity edema, dizziness, presyncope, syncope, or neurologic sequela. The patient is tolerating medications without difficulties and is otherwise without complaint today.   Past Medical History:  Diagnosis Date   Anticoagulant long-term use    Xarelto   Bronchitis    Chronic interstitial nephritis    Depression    Eye inflammation    History of interstitial nephritis 2016   chronic   History of nuclear stress test 12/16/2014   Intermediate risk nuclear study w/ medium size moderate severity reversible defect in the basal and mid inferolateral and inferior wall (per dr cardiology note , dr [name] did not think this was consistent with ischemia)/  normal LV function and wall motion, ef 75%   History of septic shock 01/22/2015   in setting Group A Strep Cellulits erysipelas/chest wall induration with streptoccocus basterium-- Severe sepsis, DIC, Acute respiratory failure with pulmonary edema, Acute Kidney failure with chronic interstitial nephritis    Hypertension    Hyponatremia    Migraines    on Zoloft for migraines   Mild carotid artery disease ([place])    per duplex 08-30-2017 bilateral ICA 1-39%   OA (osteoarthritis) rheumotologist-  dr [name]   both knees,  shoulders, ankles   OSA on CPAP     moderate obstructive sleep apnea with an AHI of 23.4/h and oxygen desaturations as low as 84%.  Now on CPAP at 12 cm H2O.   PAF (paroxysmal atrial fibrillation) [facility]) 2009   cardiologist-- dr [name]   Paroxysmal atrial flutter [facility])    a. dx 11/2017.   PSVT (paroxysmal supraventricular tachycardia) ([place])    Wears contact lenses    Past Surgical History:  Procedure Laterality Date   BREAST BIOPSY Right 05/15/2017   [place]   ESOPHAGOGASTRODUODENOSCOPY N/A 02/08/2015   Procedure: ESOPHAGOGASTRODUODENOSCOPY (EGD);  Surgeon: [name], MD;  Location: [facility];  Service: Endoscopy;  Laterality: N/A;   KNEE ARTHROSCOPY W/ MENISCAL REPAIR Left 08/2014    @[facility]    SHOULDER SURGERY Right 04/04/2016   TOTAL KNEE ARTHROPLASTY Right 09/01/2018   Procedure: RIGHT TOTAL KNEE ARTHROPLASTY;  Surgeon: [name], MD;  Location: [facility];  Service: Orthopedics;  Laterality: Right;    Current Outpatient Medications  Medication Sig Dispense Refill   acetaminophen (TYLENOL) 500 MG tablet Take 1,000 mg by mouth every 8 (eight) hours as needed (pain).     ALPRAZolam (XANAX) 0.25 MG tablet Take 1 tablet (0.25 mg total) by mouth 2 (two)  times daily as needed for anxiety. 60 tablet 1   apixaban (ELIQUIS) 5 MG TABS tablet Take 1 tablet (5 mg total) by mouth 2 (two) times daily. 60 tablet 5   cholecalciferol (VITAMIN D3) 25 MCG (1000 UT) tablet Take 1,000 Units by mouth daily.     EPINEPHrine 0.3 mg/0.3 mL IJ SOAJ injection Inject 0.3 mg into the muscle as needed for anaphylaxis. As needed for life-threatening allergic reactions 2 each 1   hydroxychloroquine (PLAQUENIL) 200 MG tablet Take 200 mg by mouth every morning.       Multiple Vitamin (MULTIVITAMIN) capsule Take 1 capsule by mouth daily.     rosuvastatin (CRESTOR) 5 MG tablet Take 1 tablet (5 mg total) by mouth daily. 90 tablet 3   sertraline (ZOLOFT) 50 MG tablet TAKE 1 TABLET BY MOUTH DAILY 90 tablet 1   atenolol (TENORMIN) 50 MG tablet Taking 1 tablet by mouth in the am and 1/2 tablet in the evening     famotidine (PEPCID) 20 MG tablet Take 1 tablet (20 mg total) by mouth 2 (two) times daily. (Patient not taking: No sig reported) 30 tablet 0   No current facility-administered medications for this encounter.    Allergies  Allergen Reactions   Epinephrine Base Other (See Comments)    Seriously increases heart rate   Aspirin Other (See Comments)    Can take the coated 325 mg. Plain asa 325 mg speeds up the heart.   Benadryl [Diphenhydramine]     Increased heart rate   Covid-19 Ad26 Vaccine([place]) Hives     hives 6 hrs after her COVID 19 2nd booster - resolved    Fish Allergy Hives    Was imitation crab meat and broke out in hives, had skin testing for lobster and showed allergic   Hylan G-F 20 Other (See Comments)    Very painful (knee injection) Very painful (knee injection)   Penicillins Other (See Comments)    Pt previously has been told not to take because of family history of reactions (water blisters). 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)  Tobacco Use   Smoking status: Never   Smokeless tobacco: Never   Tobacco comments:    H/O social smoking at times when drinking--never a "habit" (in the 1970s)  Vaping Use   Vaping Use: Never used  Substance and  Sexual Activity   Alcohol use: Yes    Alcohol/week: 0.0 standard drinks    Comment: Occasional   Drug use: Never   Sexual activity: Yes    Partners: Male    Birth control/protection: Post-menopausal    Comment: intercourse age 9, sexual partners more than 5  Other Topics Concern   Not on file  Social History Narrative   Not on file   Social Determinants of Health   Financial Resource Strain: Low Risk    Difficulty of Paying Living Expenses: Not hard at all  Food Insecurity: No Food Insecurity   Worried About [place] in the Last Year: Never true   Ran Out of  Food in the Last Year: Never true  Transportation Needs: No Transportation Needs   Lack of Transportation (Medical): No   Lack of Transportation (Non-Medical): No  Physical Activity: Sufficiently Active   Days of Exercise per Week: 5 days   Minutes of Exercise per Session: 30 min  Stress: No Stress Concern Present   Feeling of Stress : Not at all  Social Connections: Moderately Isolated   Frequency of Communication with Friends and Family: More than three times a week   Frequency of Social Gatherings with Friends and Family: More than three times a week   Attends Religious Services: Never   Marine scientist or Organizations: No   Attends Archivist Meetings: Never   Marital Status: Married  Human resources officer Violence: Not At Risk   Fear of Current or Ex-Partner: No   Emotionally Abused: No   Physically Abused: No   Sexually Abused: No    Family History  Problem Relation Age of Onset   Pancreatic cancer Brother    Lymphoma Mother    Prostate cancer Father        metastatic prostate cancer   Breast cancer Sister 81   Allergic rhinitis Sister    Urticaria Sister    Asthma Neg Hx    Eczema Neg Hx    Immunodeficiency Neg Hx    Atopy Neg Hx    Angioedema Neg Hx     ROS- All systems are reviewed and negative except as per the HPI above  Physical Exam: Vitals:   07/27/21 1321  BP:  (!) 154/76  Pulse: 62  Weight: 109.4 kg  Height: 5\' 5"  (1.651 m)   Wt Readings from Last 3 Encounters:  07/27/21 109.4 kg  07/26/21 110.2 kg  07/04/21 108.6 kg    Labs: Lab Results  Component Value Date   NA 133 (L) 07/05/2021   K 4.5 07/05/2021   CL 95 (L) 07/05/2021   CO2 22 07/05/2021   GLUCOSE 85 07/05/2021   BUN 13 07/05/2021   CREATININE 0.78 07/05/2021   CALCIUM 9.5 07/05/2021   PHOS 5.9 (H) 02/03/2015   MG 2.2 05/09/2021   Lab Results  Component Value Date   INR 1.6 03/05/2016   Lab Results  Component Value Date   CHOL 175 11/04/2020   HDL 102 11/04/2020   LDLCALC 60 11/04/2020   TRIG 69 11/04/2020     GEN- The patient is well appearing, alert and oriented x 3 today.   Head- normocephalic, atraumatic Eyes-  Sclera clear, conjunctiva pink Ears- hearing intact Oropharynx- clear Neck- supple, no JVP Lymph- no cervical lymphadenopathy Lungs- Clear to ausculation bilaterally, normal work of breathing Heart- Regular rate and rhythm, no murmurs, rubs or gallops, PMI not laterally displaced GI- soft, NT, ND, + BS Extremities- no clubbing, cyanosis, or edema MS- no significant deformity or atrophy Skin- no rash or lesion Psych- euthymic mood, full affect Neuro- strength and sensation are intact  EKG-NSR, normal ekg at 62 bpm, pr int 188 ms, qrs int 84 ms, qtc 397 ms   Zio patch -Predominant rhythm is sinus rhythm   Rates 51 to 104 bpm   Average HR 67 bpm   Frequent SVT, longest lasting 1 min 39 sec with average HR 156 bpm   Fastest HR 7 beats at 210 bpm    Rare PVCs   Triggered events correlated with SR with PVC and also SVT     Echo- 1. Left ventricular ejection fraction, by estimation, is  55 to 60%. The  left ventricle has normal function. The left ventricle has no regional  wall motion abnormalities. There is mild concentric left ventricular  hypertrophy. Left ventricular diastolic  parameters were normal.   2. Right ventricular systolic function is  normal. The right ventricular  size is normal. There is moderately elevated pulmonary artery systolic  pressure.   3. The mitral valve is normal in structure and function. Mild mitral  valve regurgitation. No evidence of mitral stenosis.   4. The aortic valve is tricuspid. Aortic valve regurgitation is not  visualized. No aortic stenosis is present.   5. The inferior vena cava is normal in size with <50% respiratory   Assessment and Plan:  1. Paroxysmal afib Last significant episode was associated with alcohol use that triggered placing monitor Since avoiding alcohol(which she did while wearing monitor and since then) and increase of atenolol, she has not any significant palpitations Discussed antiarrythmic drugs but with heart rhythm not being an issue recently, she would prefer not to change approach  Continue atenolol at 50 mg am and 25 mg pm   2. CHA2DS2VASc  score of 3 Cotinine eliquis 5 mg bid    3. OSA Continue to use cpap  4. HTN Stable   5. BMI 40.14 Regular exercise and weight loss recommended   F/u with Dr. [name] as scheduled  Afib clinic as needed   I will be glad to discuss antiarrythmic's with her in future if afib burden escalates, for now she is happy with approach   Butch Penny C. Georgette Helmer, [facility] [address] [place], [place] [address] [phone]

## 2021-08-08 ENCOUNTER — Other Ambulatory Visit: Payer: Self-pay

## 2021-08-08 ENCOUNTER — Telehealth: Payer: Self-pay | Admitting: Internal Medicine

## 2021-08-08 DIAGNOSIS — Z20822 Contact with and (suspected) exposure to covid-19: Secondary | ICD-10-CM | POA: Diagnosis not present

## 2021-08-08 MED ORDER — ATENOLOL 50 MG PO TABS: ORAL_TABLET | ORAL | 3 refills | Status: DC

## 2021-08-08 NOTE — Telephone Encounter (Signed)
Refill for Atenolol, 50 mg taking 1 tablet in the a.m, 1/2 tablet in the evening, has been sent to [facility] per pt request.

## 2021-08-08 NOTE — Telephone Encounter (Signed)
*  STAT* If patient is at the pharmacy, call can be transferred to refill team.   1. Which medications need to be refilled? (please list name of each medication and dose if known)  atenolol (TENORMIN) 50 MG tablet  2. Which pharmacy/location (including street and city if local pharmacy) is medication to be sent to? [facility] [place], [place] AT [address]   3. Do they need a 30 day or 90 day supply?  90 day supply  Patient states she has a 2-day supply remaining.

## 2021-08-10 ENCOUNTER — Telehealth: Payer: Self-pay | Admitting: Internal Medicine

## 2021-08-10 NOTE — Telephone Encounter (Signed)
I received a page regarding the patient having elevated heart rates with significant swings from 140s to the 70s.  Blood pressures been stable with systolic 505X over diastolic 83F.  She feels significantly symptomatic from the swings, she feels palpitations and inability to sleep.  She typically takes atenolol 50 mg in the a.m. and 25 mg in the p.m.  She took an extra half tablet this evening with no relief of the symptoms.  If it was safe to take an extra half tablet to see if this can alleviate some of her symptoms.  Given her stability and her blood pressure and her tolerance of the atenolol, I gave her guidance to take another half tablet now and monitor her symptoms closely.  She is overall stable, if this continues she was advised to call the atrial fibrillation clinic tomorrow for further guidance.  She stated understanding.  Billey Chang, MD  Cardiology fellow

## 2021-08-13 ENCOUNTER — Inpatient Hospital Stay: Admission: RE | Admit: 2021-08-13 | Payer: Medicare Other | Source: Ambulatory Visit

## 2021-08-13 ENCOUNTER — Other Ambulatory Visit: Payer: Medicare Other

## 2021-08-13 ENCOUNTER — Telehealth: Payer: Self-pay | Admitting: Internal Medicine

## 2021-08-13 NOTE — Telephone Encounter (Signed)
I received a page regarding the patient having elevated heart rates with significant swings from 120s to the 80s.  Blood pressures been stable. She feels significantly symptomatic from the swings, she feels palpitations. Told her to take another half dose of atenolol- follow up with Afib clinic tomorrow for a more permanent solution to her Afib. Also told to come to ED if BP drops or HR becomes higher or feels worse. She stated understanding.

## 2021-08-14 ENCOUNTER — Telehealth (HOSPITAL_COMMUNITY): Payer: Self-pay | Admitting: Nurse Practitioner

## 2021-08-14 NOTE — Telephone Encounter (Signed)
Pt called in as she has had more afib since yesterday. She talked to after hour MD yesterday and was advised to take atenolol 50 mg bid. Her HR's this am 59-114 bpm, and her BP is around 115/75.She is on eliquis 5 mg bid. She sounds stable. Reassured and given an appointment to discuss antiarrythmic's tomorrow at 10 am.

## 2021-08-15 ENCOUNTER — Ambulatory Visit (HOSPITAL_COMMUNITY)
Admission: RE | Admit: 2021-08-15 | Discharge: 2021-08-15 | Disposition: A | Discharge status: Home / Self Care | Payer: Medicare Other | Source: Ambulatory Visit | Attending: Nurse Practitioner | Admitting: Nurse Practitioner

## 2021-08-15 ENCOUNTER — Other Ambulatory Visit: Payer: Self-pay

## 2021-08-15 ENCOUNTER — Other Ambulatory Visit (HOSPITAL_COMMUNITY): Payer: Self-pay | Admitting: Nurse Practitioner

## 2021-08-15 VITALS — BP 146/68 | HR 101 | Ht 65.0 in | Wt 240.4 lb

## 2021-08-15 DIAGNOSIS — Z888 Allergy status to other drugs, medicaments and biological substances status: Secondary | ICD-10-CM | POA: Diagnosis not present

## 2021-08-15 DIAGNOSIS — Z791 Long term (current) use of non-steroidal anti-inflammatories (NSAID): Secondary | ICD-10-CM | POA: Insufficient documentation

## 2021-08-15 DIAGNOSIS — Z880 Allergy status to penicillin: Secondary | ICD-10-CM | POA: Diagnosis not present

## 2021-08-15 DIAGNOSIS — I10 Essential (primary) hypertension: Secondary | ICD-10-CM | POA: Diagnosis not present

## 2021-08-15 DIAGNOSIS — Z886 Allergy status to analgesic agent status: Secondary | ICD-10-CM | POA: Insufficient documentation

## 2021-08-15 DIAGNOSIS — Z79899 Other long term (current) drug therapy: Secondary | ICD-10-CM | POA: Insufficient documentation

## 2021-08-15 DIAGNOSIS — I4819 Other persistent atrial fibrillation: Secondary | ICD-10-CM | POA: Diagnosis not present

## 2021-08-15 DIAGNOSIS — I480 Paroxysmal atrial fibrillation: Secondary | ICD-10-CM | POA: Diagnosis not present

## 2021-08-15 DIAGNOSIS — Z96651 Presence of right artificial knee joint: Secondary | ICD-10-CM | POA: Insufficient documentation

## 2021-08-15 DIAGNOSIS — D6869 Other thrombophilia: Secondary | ICD-10-CM

## 2021-08-15 DIAGNOSIS — Z7901 Long term (current) use of anticoagulants: Secondary | ICD-10-CM | POA: Insufficient documentation

## 2021-08-15 MED ORDER — ATENOLOL 50 MG PO TABS: ORAL_TABLET | ORAL | Status: DC

## 2021-08-15 NOTE — Progress Notes (Signed)
Pt called in as she has had more afib since yesterday. She talked to after hour MD yesterday and was advised to take atenolol 50 mg bid. Her HR's this am 59-114 bpm, and her BP is around 115/75.She is on eliquis 5 mg bid. She sounds stable. Reassured and given an appointment to discuss antiarrythmic's tomorrow at 10 am.

## 2021-08-15 NOTE — Addendum Note (Signed)
Encounter addended by: Sherran Needs, NP on: 08/15/2021 1:35 PM  Actions taken: Clinical Note Signed

## 2021-08-15 NOTE — Progress Notes (Addendum)
Primary Care Physician: Amanda Anger, MD Referring Physician:Dr. Ross/Michele Bonnell Public   Amanda Wilkins is a 75 y.o. female with a h/o paroxysmal afib that is in the afib clinic for persistent afib since Saturday. She called after hours and was told to increase atenolol to 50 mg bid was of yesterday, she was still in afib and given an appointment for today. Ekg shows afib at 101 bpm. She is nervous as she plans to go to [place] for Christmas and would like to have this settled.   We discussed starting Multaq for a few days and then cardioverting on drug. She has a normal EF and normal liver panel. No history of heart failure.  She will check on price of drug.   Today, she denies symptoms of palpitations, chest pain, shortness of breath, orthopnea, PND, lower extremity edema, dizziness, presyncope, syncope, or neurologic sequela. The patient is tolerating medications without difficulties and is otherwise without complaint today.   Past Medical History:  Diagnosis Date   Anticoagulant long-term use    Xarelto   Bronchitis    Chronic interstitial nephritis    Depression    Eye inflammation    History of interstitial nephritis 2016   chronic   History of nuclear stress test 12/16/2014   Intermediate risk nuclear study w/ medium size moderate severity reversible defect in the basal and mid inferolateral and inferior wall (per dr cardiology note , dr [name] did not think this was consistent with ischemia)/  normal LV function and wall motion, ef 75%   History of septic shock 01/22/2015   in setting Group A Strep Cellulits erysipelas/chest wall induration with streptoccocus basterium-- Severe sepsis, DIC, Acute respiratory failure with pulmonary edema, Acute Kidney failure with chronic interstitial nephritis   Hypertension    Hyponatremia    Migraines    on Zoloft for migraines   Mild carotid artery disease ([place])    per duplex 08-30-2017 bilateral ICA 1-39%   OA  (osteoarthritis) rheumotologist-  dr [name]   both knees,  shoulders, ankles   OSA on CPAP     moderate obstructive sleep apnea with an AHI of 23.4/h and oxygen desaturations as low as 84%.  Now on CPAP at 12 cm H2O.   PAF (paroxysmal atrial fibrillation) [facility]) 2009   cardiologist-- dr [name]   Paroxysmal atrial flutter [facility])    a. dx 11/2017.   PSVT (paroxysmal supraventricular tachycardia) ([place])    Wears contact lenses    Past Surgical History:  Procedure Laterality Date   BREAST BIOPSY Right 05/15/2017   [place]   ESOPHAGOGASTRODUODENOSCOPY N/A 02/08/2015   Procedure: ESOPHAGOGASTRODUODENOSCOPY (EGD);  Surgeon: [name], MD;  Location: [place];  Service: Endoscopy;  Laterality: N/A;   KNEE ARTHROSCOPY W/ MENISCAL REPAIR Left 08/2014    @[facility]    SHOULDER SURGERY Right 04/04/2016   TOTAL KNEE ARTHROPLASTY Right 09/01/2018   Procedure: RIGHT TOTAL KNEE ARTHROPLASTY;  Surgeon: [name], MD;  Location: [facility];  Service: Orthopedics;  Laterality: Right;    Current Outpatient Medications  Medication Sig Dispense Refill   acetaminophen (TYLENOL) 500 MG tablet Take 1,000 mg by mouth every 8 (eight) hours as needed (pain).     ALPRAZolam (XANAX) 0.25 MG tablet Take 1 tablet (0.25 mg total) by mouth 2 (two) times daily as needed for anxiety. 60 tablet 1   apixaban (ELIQUIS) 5 MG TABS tablet Take 1 tablet (5 mg total) by mouth 2 (two)  times daily. 60 tablet 5   atenolol (TENORMIN) 50 MG tablet Taking 1 tablet by mouth in the am and 1/2 tablet in the evening 135 tablet 3   cholecalciferol (VITAMIN D3) 25 MCG (1000 UT) tablet Take 1,000 Units by mouth daily.     EPINEPHrine 0.3 mg/0.3 mL IJ SOAJ injection Inject 0.3 mg into the muscle as needed for anaphylaxis. As needed for life-threatening allergic reactions 2 each 1   hydroxychloroquine (PLAQUENIL) 200 MG tablet Take 200 mg by mouth every morning.      Multiple Vitamin (MULTIVITAMIN) capsule  Take 1 capsule by mouth daily.     rosuvastatin (CRESTOR) 5 MG tablet Take 1 tablet (5 mg total) by mouth daily. 90 tablet 3   sertraline (ZOLOFT) 50 MG tablet TAKE 1 TABLET BY MOUTH DAILY 90 tablet 1   No current facility-administered medications for this encounter.    Allergies  Allergen Reactions   Epinephrine Base Other (See Comments)    Seriously increases heart rate   Aspirin Other (See Comments)    Can take the coated 325 mg. Plain asa 325 mg speeds up the heart.   Benadryl [Diphenhydramine]     Increased heart rate   Covid-19 Ad26 Vaccine(Janssen) Hives     hives 6 hrs after her COVID 19 2nd booster - resolved    Fish Allergy Hives    Was imitation crab meat and broke out in hives, had skin testing for lobster and showed allergic   Hylan G-F 20 Other (See Comments)    Very painful (knee injection) Very painful (knee injection)   Other Other (See Comments)   Penicillins Other (See Comments)    Pt previously has been told not to take because of family history of reactions (water blisters). 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)  Tobacco Use   Smoking status: Never   Smokeless tobacco: Never   Tobacco comments:    H/O social smoking at times when drinking--never a "habit" (in the 1970s)  Vaping Use   Vaping Use: Never used  Substance and Sexual Activity   Alcohol use: Yes    Alcohol/week: 0.0 standard drinks    Comment: Occasional   Drug use: Never   Sexual activity: Yes    Partners: Male    Birth control/protection: Post-menopausal    Comment: intercourse age 43, sexual partners more  than 5  Other Topics Concern   Not on file  Social History Narrative   Not on file   Social Determinants of Health   Financial Resource Strain: Low Risk    Difficulty of Paying Living Expenses: Not hard at all  Food Insecurity: No Food Insecurity   Worried About Charity fundraiser in the Last Year: Never true   [place] in the Last Year: Never true  Transportation Needs: No Transportation Needs   Lack of Transportation (Medical): No   Lack of Transportation (Non-Medical): No  Physical Activity: Sufficiently Active   Days of Exercise per Week: 5 days   Minutes of Exercise per Session:  30 min  Stress: No Stress Concern Present   Feeling of Stress : Not at all  Social Connections: Moderately Isolated   Frequency of Communication with Friends and Family: More than three times a week   Frequency of Social Gatherings with Friends and Family: More than three times a week   Attends Religious Services: Never   Marine scientist or Organizations: No   Attends Archivist Meetings: Never   Marital Status: Married  Human resources officer Violence: Not At Risk   Fear of Current or Ex-Partner: No   Emotionally Abused: No   Physically Abused: No   Sexually Abused: No    Family History  Problem Relation Age of Onset   Pancreatic cancer Brother    Lymphoma Mother    Prostate cancer Father        metastatic prostate cancer   Breast cancer Sister 35   Allergic rhinitis Sister    Urticaria Sister    Asthma Neg Hx    Eczema Neg Hx    Immunodeficiency Neg Hx    Atopy Neg Hx    Angioedema Neg Hx     ROS- All systems are reviewed and negative except as per the HPI above  Physical Exam: Vitals:   08/15/21 1002  Weight: 109 kg  Height: 5\' 5"  (1.651 m)   Wt Readings from Last 3 Encounters:  08/15/21 109 kg  07/27/21 109.4 kg  07/26/21 110.2 kg    Labs: Lab Results  Component Value Date   NA 133 (L) 07/05/2021   K 4.5 07/05/2021   CL 95 (L) 07/05/2021   CO2  22 07/05/2021   GLUCOSE 85 07/05/2021   BUN 13 07/05/2021   CREATININE 0.78 07/05/2021   CALCIUM 9.5 07/05/2021   PHOS 5.9 (H) 02/03/2015   MG 2.2 05/09/2021   Lab Results  Component Value Date   INR 1.6 03/05/2016   Lab Results  Component Value Date   CHOL 175 11/04/2020   HDL 102 11/04/2020   LDLCALC 60 11/04/2020   TRIG 69 11/04/2020     GEN- The patient is well appearing, alert and oriented x 3 today.   Head- normocephalic, atraumatic Eyes-  Sclera clear, conjunctiva pink Ears- hearing intact Oropharynx- clear Neck- supple, no JVP Lymph- no cervical lymphadenopathy Lungs- Clear to ausculation bilaterally, normal work of breathing Heart- irregular rate and rhythm, no murmurs, rubs or gallops, PMI not laterally displaced GI- soft, NT, ND, + BS Extremities- no clubbing, cyanosis, or edema MS- no significant deformity or atrophy Skin- no rash or lesion Psych- euthymic mood, full affect Neuro- strength and sensation are intact  EKG-Vent. rate 101 BPM PR interval * ms QRS duration 76 ms QT/QTcB 332/430 ms P-R-T axes * 57 62 Atrial fibrillation with rapid ventricular response Low voltage QRS Abnormal ECG  Echo- 12/09/19- 1. Left ventricular ejection fraction, by estimation, is 55 to 60%. The  left ventricle has normal function. The left ventricle has no regional  wall motion abnormalities. There is mild concentric left ventricular  hypertrophy. Left ventricular diastolic  parameters were normal.   2. Right ventricular systolic function is normal. The right ventricular  size is normal. There is moderately elevated pulmonary artery systolic  pressure.   3. The mitral valve is normal in structure and function. Mild mitral  valve regurgitation. No evidence of mitral stenosis.   4. The aortic valve is tricuspid. Aortic valve regurgitation is not  visualized. No aortic stenosis is present.   5. The  inferior vena cava is normal in size with <50% respiratory   variability, suggesting right atrial pressure of 8 mmHg.   Comparison(s): 01/25/15 EF 55-60%. PA pressure 58mmHg.  Assessment and Plan:  1.Paroxysmal afib Persistent since Saturday Has increased atenolol to 50 mg bid of 3 days but still out of rhythm  Discussed antiarrythmic's She will check on price of drug and if feasible, will start 3 days before cardioversion and reduce atenolol back to 25 mg bid Risk vrs benefit discussed with pt today If price is an issue, will plan for lexi myoview and if low risk, start flecainde 50 mg bid   Pt will call back once she prices Multaq  2. CHA2DS2VASc  score of 3 Continue eliquis 5 mg bid states no missed doses for at least 3 weeks   Addendum- pt called back to office and said that her insurance will not cover cost of Multaq. Therefore, will plan for flecainide. Will order Lexi Myoview.    Geroge Baseman Jamicah Anstead, [facility] [address] [place], [place] [address] [phone]

## 2021-08-15 NOTE — Addendum Note (Signed)
Encounter addended by: Juluis Mire, RN on: 08/15/2021 1:16 PM  Actions taken: Visit diagnoses modified, Order list changed, Diagnosis association updated

## 2021-08-16 ENCOUNTER — Telehealth (HOSPITAL_COMMUNITY): Payer: Self-pay | Admitting: *Deleted

## 2021-08-16 ENCOUNTER — Ambulatory Visit: Payer: Medicare Other | Admitting: Orthopaedic Surgery

## 2021-08-16 NOTE — Telephone Encounter (Signed)
Patient given detailed instructions per Myocardial Perfusion Study Information Sheet for the test on 08/21/21 at 1:15. Patient notified to arrive 15 minutes early and that it is imperative to arrive on time for appointment to keep from having the test rescheduled.  If you need to cancel or reschedule your appointment, please call the office within 24 hours of your appointment. . Patient verbalized understanding.Amanda Wilkins

## 2021-08-19 ENCOUNTER — Inpatient Hospital Stay (HOSPITAL_COMMUNITY)
Admission: EM | Admit: 2021-08-19 | Discharge: 2021-08-22 | DRG: 309 | Disposition: A | Discharge status: Home / Self Care | Payer: Medicare Other | Attending: Internal Medicine | Admitting: Internal Medicine

## 2021-08-19 ENCOUNTER — Other Ambulatory Visit: Payer: Self-pay

## 2021-08-19 ENCOUNTER — Telehealth: Payer: Self-pay | Admitting: Internal Medicine

## 2021-08-19 ENCOUNTER — Encounter (HOSPITAL_COMMUNITY): Payer: Self-pay | Admitting: Emergency Medicine

## 2021-08-19 ENCOUNTER — Emergency Department (HOSPITAL_COMMUNITY): Payer: Medicare Other

## 2021-08-19 DIAGNOSIS — Z862 Personal history of diseases of the blood and blood-forming organs and certain disorders involving the immune mechanism: Secondary | ICD-10-CM

## 2021-08-19 DIAGNOSIS — Z886 Allergy status to analgesic agent status: Secondary | ICD-10-CM

## 2021-08-19 DIAGNOSIS — I959 Hypotension, unspecified: Secondary | ICD-10-CM | POA: Diagnosis present

## 2021-08-19 DIAGNOSIS — I4819 Other persistent atrial fibrillation: Secondary | ICD-10-CM | POA: Diagnosis not present

## 2021-08-19 DIAGNOSIS — I4891 Unspecified atrial fibrillation: Secondary | ICD-10-CM | POA: Diagnosis not present

## 2021-08-19 DIAGNOSIS — E785 Hyperlipidemia, unspecified: Secondary | ICD-10-CM | POA: Diagnosis present

## 2021-08-19 DIAGNOSIS — I480 Paroxysmal atrial fibrillation: Secondary | ICD-10-CM | POA: Diagnosis present

## 2021-08-19 DIAGNOSIS — E871 Hypo-osmolality and hyponatremia: Secondary | ICD-10-CM | POA: Diagnosis not present

## 2021-08-19 DIAGNOSIS — Z887 Allergy status to serum and vaccine status: Secondary | ICD-10-CM

## 2021-08-19 DIAGNOSIS — Z23 Encounter for immunization: Secondary | ICD-10-CM

## 2021-08-19 DIAGNOSIS — Z91013 Allergy to seafood: Secondary | ICD-10-CM

## 2021-08-19 DIAGNOSIS — I700 Atherosclerosis of aorta: Secondary | ICD-10-CM | POA: Diagnosis not present

## 2021-08-19 DIAGNOSIS — E119 Type 2 diabetes mellitus without complications: Secondary | ICD-10-CM | POA: Diagnosis not present

## 2021-08-19 DIAGNOSIS — Z880 Allergy status to penicillin: Secondary | ICD-10-CM

## 2021-08-19 DIAGNOSIS — E669 Obesity, unspecified: Secondary | ICD-10-CM | POA: Diagnosis present

## 2021-08-19 DIAGNOSIS — Z6839 Body mass index (BMI) 39.0-39.9, adult: Secondary | ICD-10-CM

## 2021-08-19 DIAGNOSIS — G4733 Obstructive sleep apnea (adult) (pediatric): Secondary | ICD-10-CM

## 2021-08-19 DIAGNOSIS — M069 Rheumatoid arthritis, unspecified: Secondary | ICD-10-CM | POA: Diagnosis present

## 2021-08-19 DIAGNOSIS — Z96651 Presence of right artificial knee joint: Secondary | ICD-10-CM | POA: Diagnosis present

## 2021-08-19 DIAGNOSIS — Z713 Dietary counseling and surveillance: Secondary | ICD-10-CM

## 2021-08-19 DIAGNOSIS — F4323 Adjustment disorder with mixed anxiety and depressed mood: Secondary | ICD-10-CM | POA: Diagnosis present

## 2021-08-19 DIAGNOSIS — Z7901 Long term (current) use of anticoagulants: Secondary | ICD-10-CM

## 2021-08-19 DIAGNOSIS — Z20822 Contact with and (suspected) exposure to covid-19: Secondary | ICD-10-CM | POA: Diagnosis present

## 2021-08-19 DIAGNOSIS — Z560 Unemployment, unspecified: Secondary | ICD-10-CM

## 2021-08-19 DIAGNOSIS — Z91048 Other nonmedicinal substance allergy status: Secondary | ICD-10-CM

## 2021-08-19 DIAGNOSIS — Z79899 Other long term (current) drug therapy: Secondary | ICD-10-CM

## 2021-08-19 DIAGNOSIS — I10 Essential (primary) hypertension: Secondary | ICD-10-CM | POA: Diagnosis present

## 2021-08-19 DIAGNOSIS — Z888 Allergy status to other drugs, medicaments and biological substances status: Secondary | ICD-10-CM

## 2021-08-19 DIAGNOSIS — N119 Chronic tubulo-interstitial nephritis, unspecified: Secondary | ICD-10-CM | POA: Diagnosis present

## 2021-08-19 DIAGNOSIS — Z8673 Personal history of transient ischemic attack (TIA), and cerebral infarction without residual deficits: Secondary | ICD-10-CM

## 2021-08-19 HISTORY — DX: Acute kidney failure, unspecified: N17.9

## 2021-08-19 HISTORY — DX: Other specified abnormal findings of blood chemistry: R79.89

## 2021-08-19 HISTORY — DX: Pain in unspecified upper arm: M79.629

## 2021-08-19 HISTORY — DX: Cellulitis, unspecified: L03.90

## 2021-08-19 HISTORY — DX: Cholecystitis, unspecified: K81.9

## 2021-08-19 LAB — COMPREHENSIVE METABOLIC PANEL
ALT: 40 U/L (ref 0–44)
AST: 29 U/L (ref 15–41)
Albumin: 3.9 g/dL (ref 3.5–5.0)
Alkaline Phosphatase: 63 U/L (ref 38–126)
Anion gap: 8 (ref 5–15)
BUN: 13 mg/dL (ref 8–23)
CO2: 23 mmol/L (ref 22–32)
Calcium: 9.2 mg/dL (ref 8.9–10.3)
Chloride: 101 mmol/L (ref 98–111)
Creatinine, Ser: 0.95 mg/dL (ref 0.44–1.00)
GFR, Estimated: >60 mL/min (ref >60)
Glucose, Bld: 99 mg/dL (ref 70–99)
Potassium: 4.4 mmol/L (ref 3.5–5.1)
Sodium: 132 mmol/L — ABNORMAL LOW (ref 135–145)
Total Bilirubin: 0.9 mg/dL (ref 0.3–1.2)
Total Protein: 6.7 g/dL (ref 6.5–8.1)

## 2021-08-19 LAB — CBC
HCT: 36.1 % (ref 36.0–46.0)
Hemoglobin: 12.1 g/dL (ref 12.0–15.0)
MCH: 33.7 pg (ref 26.0–34.0)
MCHC: 33.5 g/dL (ref 30.0–36.0)
MCV: 100.6 fL — ABNORMAL HIGH (ref 80.0–100.0)
Platelets: 186 10*3/uL (ref 150–400)
RBC: 3.59 MIL/uL — ABNORMAL LOW (ref 3.87–5.11)
RDW: 12.7 % (ref 11.5–15.5)
WBC: 7.6 10*3/uL (ref 4.0–10.5)
nRBC: 0 % (ref 0.0–0.2)

## 2021-08-19 MED ORDER — DILTIAZEM HCL-DEXTROSE 125-5 MG/125ML-% IV SOLN (PREMIX)
5.0000 mg/h | INTRAVENOUS | Status: DC
  Administered 2021-08-20: 5 mg/h via INTRAVENOUS
  Administered 2021-08-20: 7.5 mg/h via INTRAVENOUS
  Administered 2021-08-20: 15 mg/h via INTRAVENOUS
  Filled 2021-08-19 (×4): qty 125

## 2021-08-19 MED ORDER — DILTIAZEM LOAD VIA INFUSION
15.0000 mg | Freq: Once | INTRAVENOUS | Status: AC
  Administered 2021-08-20: 15 mg via INTRAVENOUS
  Filled 2021-08-19: qty 15

## 2021-08-19 NOTE — ED Provider Notes (Signed)
Emergency Medicine Provider Triage Evaluation Note  Amanda Wilkins , a 75 y.o. female  was evaluated in triage.  Pt complains of feeling lightheaded, weak, short of breath this morning, BP at home in 95V systolic. Improved now that blood pressure is in normal range. Chronic afib, takes eliquis. Followed closely by cardiology and scheduled for stress test and cardioversion sometime soon. No chest pain. Recent diarrhea. No constipation, NV, abdominal pain.  Review of Systems  Positive: As above Negative: As above  Physical Exam  BP (!) 142/102 (BP Location: Right Arm)   Pulse (!) 114   Temp 97.6 F (36.4 C) (Oral)   Resp (!) 22   SpO2 100%  Gen:   Awake, no distress   Resp:  Normal effort  MSK:   Moves extremities without difficulty  Other:  Irregularly irregular heartbeat, fast rate  Medical Decision Making  Medically screening exam initiated at 9:31 PM.  Appropriate orders placed.  Amanda Wilkins was informed that the remainder of the evaluation will be completed by another provider, this initial triage assessment does not replace that evaluation, and the importance of remaining in the ED until their evaluation is complete.  Afib, low bp, SOB, symptoms largely improved at this time per patient   Dorien Chihuahua 08/19/21 2134    Malvin Johns, MD 08/20/21 1103

## 2021-08-19 NOTE — ED Triage Notes (Signed)
Pt c/o shortness of breath, worse with exertion. Seen at afib clinic this week, pt states she's scheduled for stress test and cardioversion. Reports taking her blood thinner and atenolol this am.
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
## 2021-08-20 ENCOUNTER — Encounter (HOSPITAL_COMMUNITY): Payer: Self-pay | Admitting: Internal Medicine

## 2021-08-20 DIAGNOSIS — F4323 Adjustment disorder with mixed anxiety and depressed mood: Secondary | ICD-10-CM | POA: Diagnosis present

## 2021-08-20 DIAGNOSIS — M06071 Rheumatoid arthritis without rheumatoid factor, right ankle and foot: Secondary | ICD-10-CM | POA: Diagnosis not present

## 2021-08-20 DIAGNOSIS — I480 Paroxysmal atrial fibrillation: Secondary | ICD-10-CM | POA: Diagnosis present

## 2021-08-20 DIAGNOSIS — Z23 Encounter for immunization: Secondary | ICD-10-CM | POA: Diagnosis not present

## 2021-08-20 DIAGNOSIS — I700 Atherosclerosis of aorta: Secondary | ICD-10-CM | POA: Diagnosis present

## 2021-08-20 DIAGNOSIS — I10 Essential (primary) hypertension: Secondary | ICD-10-CM

## 2021-08-20 DIAGNOSIS — I959 Hypotension, unspecified: Secondary | ICD-10-CM | POA: Diagnosis present

## 2021-08-20 DIAGNOSIS — I4891 Unspecified atrial fibrillation: Secondary | ICD-10-CM | POA: Diagnosis present

## 2021-08-20 DIAGNOSIS — M06072 Rheumatoid arthritis without rheumatoid factor, left ankle and foot: Secondary | ICD-10-CM

## 2021-08-20 DIAGNOSIS — Z9989 Dependence on other enabling machines and devices: Secondary | ICD-10-CM

## 2021-08-20 DIAGNOSIS — Z8673 Personal history of transient ischemic attack (TIA), and cerebral infarction without residual deficits: Secondary | ICD-10-CM | POA: Diagnosis not present

## 2021-08-20 DIAGNOSIS — E871 Hypo-osmolality and hyponatremia: Secondary | ICD-10-CM | POA: Diagnosis present

## 2021-08-20 DIAGNOSIS — G4733 Obstructive sleep apnea (adult) (pediatric): Secondary | ICD-10-CM | POA: Diagnosis present

## 2021-08-20 DIAGNOSIS — Z6839 Body mass index (BMI) 39.0-39.9, adult: Secondary | ICD-10-CM | POA: Diagnosis not present

## 2021-08-20 DIAGNOSIS — Z886 Allergy status to analgesic agent status: Secondary | ICD-10-CM | POA: Diagnosis not present

## 2021-08-20 DIAGNOSIS — Z880 Allergy status to penicillin: Secondary | ICD-10-CM | POA: Diagnosis not present

## 2021-08-20 DIAGNOSIS — I4819 Other persistent atrial fibrillation: Secondary | ICD-10-CM | POA: Diagnosis present

## 2021-08-20 DIAGNOSIS — E669 Obesity, unspecified: Secondary | ICD-10-CM | POA: Diagnosis present

## 2021-08-20 DIAGNOSIS — Z7901 Long term (current) use of anticoagulants: Secondary | ICD-10-CM | POA: Diagnosis not present

## 2021-08-20 DIAGNOSIS — Z79899 Other long term (current) drug therapy: Secondary | ICD-10-CM | POA: Diagnosis not present

## 2021-08-20 DIAGNOSIS — N119 Chronic tubulo-interstitial nephritis, unspecified: Secondary | ICD-10-CM | POA: Diagnosis present

## 2021-08-20 DIAGNOSIS — E785 Hyperlipidemia, unspecified: Secondary | ICD-10-CM

## 2021-08-20 DIAGNOSIS — Z96651 Presence of right artificial knee joint: Secondary | ICD-10-CM | POA: Diagnosis present

## 2021-08-20 DIAGNOSIS — M069 Rheumatoid arthritis, unspecified: Secondary | ICD-10-CM | POA: Diagnosis present

## 2021-08-20 DIAGNOSIS — Z20822 Contact with and (suspected) exposure to covid-19: Secondary | ICD-10-CM | POA: Diagnosis present

## 2021-08-20 DIAGNOSIS — Z887 Allergy status to serum and vaccine status: Secondary | ICD-10-CM | POA: Diagnosis not present

## 2021-08-20 DIAGNOSIS — Z862 Personal history of diseases of the blood and blood-forming organs and certain disorders involving the immune mechanism: Secondary | ICD-10-CM | POA: Diagnosis not present

## 2021-08-20 DIAGNOSIS — E119 Type 2 diabetes mellitus without complications: Secondary | ICD-10-CM | POA: Diagnosis present

## 2021-08-20 DIAGNOSIS — Z91013 Allergy to seafood: Secondary | ICD-10-CM | POA: Diagnosis not present

## 2021-08-20 LAB — RESP PANEL BY RT-PCR (FLU A&B, COVID) ARPGX2
Influenza A by PCR: NEGATIVE
Influenza B by PCR: NEGATIVE
SARS Coronavirus 2 by RT PCR: NEGATIVE

## 2021-08-20 LAB — CBC
HCT: 38.2 % (ref 36.0–46.0)
Hemoglobin: 12.7 g/dL (ref 12.0–15.0)
MCH: 33.3 pg (ref 26.0–34.0)
MCHC: 33.2 g/dL (ref 30.0–36.0)
MCV: 100.3 fL — ABNORMAL HIGH (ref 80.0–100.0)
Platelets: 192 10*3/uL (ref 150–400)
RBC: 3.81 MIL/uL — ABNORMAL LOW (ref 3.87–5.11)
RDW: 12.7 % (ref 11.5–15.5)
WBC: 7.6 10*3/uL (ref 4.0–10.5)
nRBC: 0 % (ref 0.0–0.2)

## 2021-08-20 LAB — BASIC METABOLIC PANEL
Anion gap: 11 (ref 5–15)
BUN: 6 mg/dL — ABNORMAL LOW (ref 8–23)
CO2: 22 mmol/L (ref 22–32)
Calcium: 9.5 mg/dL (ref 8.9–10.3)
Chloride: 99 mmol/L (ref 98–111)
Creatinine, Ser: 0.76 mg/dL (ref 0.44–1.00)
GFR, Estimated: >60 mL/min (ref >60)
Glucose, Bld: 110 mg/dL — ABNORMAL HIGH (ref 70–99)
Potassium: 4 mmol/L (ref 3.5–5.1)
Sodium: 132 mmol/L — ABNORMAL LOW (ref 135–145)

## 2021-08-20 LAB — TROPONIN I (HIGH SENSITIVITY)
Troponin I (High Sensitivity): 6 ng/L (ref <18)
Troponin I (High Sensitivity): 6 ng/L (ref <18)

## 2021-08-20 LAB — MAGNESIUM: Magnesium: 2 mg/dL (ref 1.7–2.4)

## 2021-08-20 MED ORDER — HYDROXYCHLOROQUINE SULFATE 200 MG PO TABS
200.0000 mg | ORAL_TABLET | Freq: Every morning | ORAL | Status: DC
  Administered 2021-08-20 – 2021-08-22 (×3): 200 mg via ORAL
  Filled 2021-08-20 (×3): qty 1

## 2021-08-20 MED ORDER — ATENOLOL 50 MG PO TABS
50.0000 mg | ORAL_TABLET | Freq: Two times a day (BID) | ORAL | Status: DC
  Administered 2021-08-20: 50 mg via ORAL
  Filled 2021-08-20: qty 2
  Filled 2021-08-20 (×2): qty 1

## 2021-08-20 MED ORDER — ONDANSETRON HCL 4 MG/2ML IJ SOLN: 4.0000 mg | Freq: Four times a day (QID) | INTRAMUSCULAR | Status: DC | PRN

## 2021-08-20 MED ORDER — EPINEPHRINE PF 1 MG/ML IJ SOLN: 0.3000 mg | INTRAMUSCULAR | Status: DC | PRN

## 2021-08-20 MED ORDER — APIXABAN 5 MG PO TABS
5.0000 mg | ORAL_TABLET | Freq: Two times a day (BID) | ORAL | Status: DC
  Administered 2021-08-20 – 2021-08-22 (×5): 5 mg via ORAL
  Filled 2021-08-20 (×6): qty 1

## 2021-08-20 MED ORDER — ACETAMINOPHEN 325 MG PO TABS: 650.0000 mg | ORAL_TABLET | ORAL | Status: DC | PRN

## 2021-08-20 MED ORDER — SERTRALINE HCL 50 MG PO TABS
50.0000 mg | ORAL_TABLET | Freq: Every day | ORAL | Status: DC
  Administered 2021-08-20 – 2021-08-22 (×3): 50 mg via ORAL
  Filled 2021-08-20 (×3): qty 1

## 2021-08-20 MED ORDER — ALPRAZOLAM 0.25 MG PO TABS
0.2500 mg | ORAL_TABLET | Freq: Two times a day (BID) | ORAL | Status: DC | PRN
  Administered 2021-08-21 (×2): 0.25 mg via ORAL
  Filled 2021-08-20 (×2): qty 1

## 2021-08-20 MED ORDER — ROSUVASTATIN CALCIUM 5 MG PO TABS
5.0000 mg | ORAL_TABLET | Freq: Every day | ORAL | Status: DC
  Administered 2021-08-20 – 2021-08-22 (×3): 5 mg via ORAL
  Filled 2021-08-20 (×4): qty 1

## 2021-08-20 MED ORDER — INFLUENZA VAC A&B SA ADJ QUAD 0.5 ML IM PRSY
0.5000 mL | PREFILLED_SYRINGE | INTRAMUSCULAR | Status: AC
  Administered 2021-08-22: 0.5 mL via INTRAMUSCULAR
  Filled 2021-08-20: qty 0.5

## 2021-08-20 MED ORDER — FLECAINIDE ACETATE 50 MG PO TABS
50.0000 mg | ORAL_TABLET | Freq: Two times a day (BID) | ORAL | Status: DC
  Administered 2021-08-20 – 2021-08-22 (×4): 50 mg via ORAL
  Filled 2021-08-20 (×5): qty 1

## 2021-08-20 NOTE — Progress Notes (Signed)
  Subjective: Patient admitted this morning, see detailed H&P by Dr Trilby Drummer 75 year old female with medical history of anxiety, depression, alopecia, anemia, asthma, allergies, hyperlipidemia, hypertension, migraine, low back pain, OSA on CPAP, atrial fibrillation, rheumatoid arthritis, syncope, thrombocytopenia, carotid artery disease presented with lightheadedness, weakness and shortness of breath. In the ED she was found to be in A. fib with RVR, started on Cardizem drip.   Vitals:   08/20/21 1245 08/20/21 1300  BP: 124/75 117/76  Pulse: 70 69  Resp: 20 (!) 23  Temp:    SpO2: 97% 99%      A/P A. fib with RVR -Patient has paroxysmal A. Fib -Was seen in the A. fib clinic, had discussion to start Multaq however it was not covered -Presented to the ED with A. fib with RVR -Started on Cardizem drip -Continue anticoagulation with apixaban  Hypertension -Continue atenolol  Hyperlipidemia -Continue rosuvastatin  Anxiety/depression -Continue Xanax, sertraline  Rheumatoid arthritis -Continue hydroxychloroquine  OSA -Continue home CPAP    [facility]

## 2021-08-20 NOTE — ED Notes (Signed)
Pt saw a little air bubble in IV tubing. Pt detached IV because she was scared of the air bubble. RN explain that she needs the medication going through her IV and that the air bubble will not harm her.

## 2021-08-20 NOTE — H&P (Signed)
History and Physical   Amanda Wilkins WNU:[id number] DOB: 06-11-1946 DOA: 08/19/2021  PCP: [name], MD   Patient coming from: Home  Chief Complaint: Weakness, shortness of breath, low blood pressure  HPI: Amanda Wilkins is a 75 y.o. female with medical history significant of anxiety, depression, alopecia, anemia, asthma, allergies, hyperlipidemia, hypertension, migraine, low back pain, OSA on CPAP, atrial fibrillation, rheumatoid arthritis, syncope, thrombocytopenia, carotid artery disease who presents after episode of lightheadedness, weakness and shortness of breath.  Patient has been experiencing symptoms in the setting of recurrent A. fib.  She states she went A. fib on Friday or Saturday and she called A. fib clinic and they discussed possibly starting Multaq however this was not covered by her insurance.  She noticed she was having symptoms of lightheadedness, weakness and shortness of breath for the past week but this was worse today.  She checked her blood pressure at home today and is in the 03K systolic and in the 74Q on recheck.  This is what prompted her to contact the on-call cardiologist at her cardiologist office recommend her to be evaluated in the ED.   She has had 4 episodes of significant atrial fibrillation last 30 years. Today heart rate been fluctuating significantly between 100s to 140s.  She is on Eliquis and has been taking this consistently for the past 3 weeks.  She denies fevers, chills, chest pain, abdominal pain, constipation, diarrhea, nausea, vomiting.   ED Course: Vital signs in the ED significant for heart rate ranging in ED upper 80s to 140s, respiratory rate in the teens to 20s, blood pressure in the 595G 387F systolic.  CBC C was within normal limits.  CMP showed sodium 132 otherwise within normal limits.  Troponin to be ordered and pending.  Respiratory panel for flu and COVID pending.  Chest x-ray showed no acute abnormality.  Diltiazem  drip started in the ED.  Review of Systems: As per HPI otherwise all other systems reviewed and are negative.  Past Medical History:  Diagnosis Date   Acute bronchitis 09/11/2016   11/17 refractory   Acute cystitis without hematuria 05/17/2015   Acute kidney injury [facility])    Labs today   Acute right ankle pain 09/09/2018   Anticoagulant long-term use    Xarelto   Axillary pain    Bronchitis    Cellulitis    Cholecystitis    Chronic interstitial nephritis    CONJUNCTIVITIS, ACUTE 10/18/2010   Qualifier: Diagnosis of  By: Alain Marion MD, Evie Lacks    D-dimer, elevated    Depression    Eye inflammation    History of interstitial nephritis 2016   chronic   History of nuclear stress test 12/16/2014   Intermediate risk nuclear study w/ medium size moderate severity reversible defect in the basal and mid inferolateral and inferior wall (per dr cardiology note , dr [name] did not think this was consistent with ischemia)/  normal LV function and wall motion, ef 75%   History of septic shock 01/22/2015   in setting Group A Strep Cellulits erysipelas/chest wall induration with streptoccocus basterium-- Severe sepsis, DIC, Acute respiratory failure with pulmonary edema, Acute Kidney failure with chronic interstitial nephritis   Hypertension    Hyponatremia    Migraines    on Zoloft for migraines   Mild carotid artery disease ([place])    per duplex 08-30-2017 bilateral ICA 1-39%   OA (osteoarthritis) rheumotologist-  dr [name]   both knees,  shoulders, ankles  OSA on CPAP     moderate obstructive sleep apnea with an AHI of 23.4/h and oxygen desaturations as low as 84%.  Now on CPAP at 12 cm H2O.   PAF (paroxysmal atrial fibrillation) [facility]) 2009   cardiologist-- dr [name]   Paroxysmal atrial flutter [facility])    a. dx 11/2017.   PSVT (paroxysmal supraventricular tachycardia) ([place])    Wears contact lenses     Past Surgical History:  Procedure Laterality Date   BREAST BIOPSY Right  05/15/2017   [place]   ESOPHAGOGASTRODUODENOSCOPY N/A 02/08/2015   Procedure: ESOPHAGOGASTRODUODENOSCOPY (EGD);  Surgeon: [name], MD;  Location: [place];  Service: Endoscopy;  Laterality: N/A;   KNEE ARTHROSCOPY W/ MENISCAL REPAIR Left 08/2014    @[facility]    SHOULDER SURGERY Right 04/04/2016   TOTAL KNEE ARTHROPLASTY Right 09/01/2018   Procedure: RIGHT TOTAL KNEE ARTHROPLASTY;  Surgeon: [name], MD;  Location: [facility];  Service: Orthopedics;  Laterality: Right;    Social History  reports that she has never smoked. She has never used smokeless tobacco. She reports current alcohol use. She reports that she does not use drugs.  Allergies  Allergen Reactions   Epinephrine Base Other (See Comments)    Seriously increases heart rate   Aspirin Other (See Comments)    Can take the coated 325 mg. Plain asa 325 mg speeds up the heart.   Benadryl [Diphenhydramine]     Increased heart rate   Covid-19 Ad26 Vaccine(Janssen) Hives     hives 6 hrs after her COVID 19 2nd booster - resolved    Fish Allergy Hives    Was imitation crab meat and broke out in hives, had skin testing for lobster and showed allergic   Hylan G-F 20 Other (See Comments)    Very painful (knee injection) Very painful (knee injection)   Other Other (See Comments)   Penicillins Other (See Comments)    Pt previously has been told not to take because of family history of reactions (water blisters). She took amoxicillin in 2012-2013 with no reaction   Tape Other (See Comments)    Blisters from adhesive tape    Family History  Problem Relation Age of Onset   Pancreatic cancer Brother    Lymphoma Mother    Prostate cancer Father        metastatic prostate cancer   Breast cancer Sister 30   Allergic rhinitis Sister    Urticaria Sister    Asthma Neg Hx    Eczema Neg Hx    Immunodeficiency Neg Hx    Atopy Neg Hx    Angioedema Neg Hx   Reviewed on admission  Prior to Admission  medications   Medication Sig Start Date End Date Taking? Authorizing Provider  acetaminophen (TYLENOL) 500 MG tablet Take 1,000 mg by mouth every 8 (eight) hours as needed (pain).    [provider]  ALPRAZolam Duanne Moron) 0.25 MG tablet Take 1 tablet (0.25 mg total) by mouth 2 (two) times daily as needed for anxiety. 11/21/20   Plotnikov, Evie Lacks, MD  apixaban (ELIQUIS) 5 MG TABS tablet Take 1 tablet (5 mg total) by mouth 2 (two) times daily. 04/26/21   Fay Records, MD  atenolol (TENORMIN) 50 MG tablet Taking 1 tablet by mouth in the am and 1 tablet in the evening 08/15/21   [provider]  cholecalciferol (VITAMIN D3) 25 MCG (1000 UT) tablet Take 1,000 Units by mouth daily.  [provider]  EPINEPHrine 0.3 mg/0.3 mL IJ SOAJ injection Inject 0.3 mg into the muscle as needed for anaphylaxis. As needed for life-threatening allergic reactions 08/03/20   Kennith Gain, MD  hydroxychloroquine (PLAQUENIL) 200 MG tablet Take 200 mg by mouth every morning.     [provider]  Multiple Vitamin (MULTIVITAMIN) capsule Take 1 capsule by mouth daily.    [provider]  rosuvastatin (CRESTOR) 5 MG tablet Take 1 tablet (5 mg total) by mouth daily. 12/16/20   Plotnikov, Evie Lacks, MD  sertraline (ZOLOFT) 50 MG tablet TAKE 1 TABLET BY MOUTH DAILY 07/03/21   Plotnikov, Evie Lacks, MD    Physical Exam: Vitals:   08/19/21 2315 08/19/21 2330 08/19/21 2345 08/20/21 0000  BP: 123/87 (!) 148/79 (!) 145/97 136/74  Pulse: (!) 48 63 (!) 137 80  Resp: (!) 21 20 17 17   Temp:      TempSrc:      SpO2: 100% 98% 98% 96%   Physical Exam Constitutional:      General: She is not in acute distress.    Appearance: Normal appearance. She is obese.  HENT:     Head: Normocephalic and atraumatic.     Mouth/Throat:     Mouth: Mucous membranes are moist.     Pharynx: Oropharynx is clear.  Eyes:     Extraocular Movements: Extraocular movements intact.     Pupils: Pupils  are equal, round, and reactive to light.  Cardiovascular:     Rate and Rhythm: Tachycardia present. Rhythm irregular.     Pulses: Normal pulses.     Heart sounds: Normal heart sounds.  Pulmonary:     Effort: Pulmonary effort is normal. No respiratory distress.     Breath sounds: Normal breath sounds.  Abdominal:     General: Bowel sounds are normal. There is no distension.     Palpations: Abdomen is soft.     Tenderness: There is no abdominal tenderness.  Musculoskeletal:        General: No swelling or deformity.  Skin:    General: Skin is warm and dry.  Neurological:     General: No focal deficit present.     Mental Status: Mental status is at baseline.   Labs on Admission: I have personally reviewed following labs and imaging studies  CBC: Recent Labs  Lab 08/19/21 2134  WBC 7.6  HGB 12.1  HCT 36.1  MCV 100.6*  PLT 811    Basic Metabolic Panel: Recent Labs  Lab 08/19/21 2134  NA 132*  K 4.4  CL 101  CO2 23  GLUCOSE 99  BUN 13  CREATININE 0.95  CALCIUM 9.2    GFR: Estimated Creatinine Clearance: 63.8 mL/min (by C-G formula based on SCr of 0.95 mg/dL).  Liver Function Tests: Recent Labs  Lab 08/19/21 2134  AST 29  ALT 40  ALKPHOS 63  BILITOT 0.9  PROT 6.7  ALBUMIN 3.9    Urine analysis:    Component Value Date/Time   COLORURINE YELLOW 04/07/2021 1352   APPEARANCEUR CLEAR 04/07/2021 1352   LABSPEC 1.010 04/07/2021 1352   PHURINE 6.5 04/07/2021 1352   GLUCOSEU NEGATIVE 04/07/2021 [place] 02/26/2017 [place] 04/07/2021 1352   BILIRUBINUR neg 05/19/2020 1452   [place] 04/07/2021 1352   PROTEINUR NEGATIVE 04/07/2021 1352   UROBILINOGEN 0.2 05/19/2020 1452   UROBILINOGEN 0.2 02/26/2017 1212   NITRITE neg 05/19/2020 1452   NITRITE NEGATIVE 02/26/2017 1212  LEUKOCYTESUR Negative 05/19/2020 1452    Radiological Exams on Admission: DG Chest 2 View  Result Date: 08/19/2021 CLINICAL DATA:  AFib and  lightheadedness. EXAM: CHEST - 2 VIEW COMPARISON:  Chest radiograph dated 10/20/2020. FINDINGS: Diffuse chronic interstitial coarsening. No focal consolidation, pleural effusion, or pneumothorax. The cardiac silhouette is within normal limits. Atherosclerotic calcification of the aorta. No acute osseous pathology. IMPRESSION: No active cardiopulmonary disease. Electronically Signed   By: Anner Crete M.D.   On: 08/19/2021 22:02    EKG: Independently reviewed.  Atrial fibrillation with RVR at 139 bpm.  Assessment/Plan Principal Problem:   Atrial fibrillation with RVR (HCC) Active Problems:   Adjustment disorder with mixed anxiety and depressed mood   OSA on CPAP   Essential hypertension   RA (rheumatoid arthritis) (HCC)   Dyslipidemia  Atrial fibrillation with RVR > Has only had 4 significant of his A. fib in the past 30 years, is very paroxysmal. > Went into A. fib 5 or 6 days ago called A. fib clinic had discussed starting Multaq however this was not covered. > Today had been lightheaded and weak with some shortness of breath and had low blood pressures at home. > Heart rate in the ED primarily between the 120s to 140s.  Started on diltiazem in the ED. > Is taking Eliquis at home. > May benefit from cardiology consult in the morning consider she does follow with cardiology/A. fib clinic. - Monitor in progressive unit - Continue diltiazem drip - Check magnesium - Check troponin - Echocardiogram - Continue home atenolol and Eliquis  Hypertension - Continue home atenolol  Hyperlipidemia - Continue home rosuvastatin  Anxiety Depression - Continue home Xanax and sertraline  Rheumatoid arthritis - Continue hydroxychloroquine  OSA - Continue home CPAP   DVT prophylaxis: Eliquis Code Status:   Full  Family Communication:  Spouse updated at bedside Disposition Plan:   Patient is from:  Home  Anticipated DC to:  Home  Anticipated DC date:  1 to 2 days  Anticipated DC  barriers: None  Consults called:  None Admission status:  Observation, progressive  Severity of Illness: The appropriate patient status for this patient is OBSERVATION. Observation status is judged to be reasonable and necessary in order to provide the required intensity of service to ensure the patient's safety. The patient's presenting symptoms, physical exam findings, and initial radiographic and laboratory data in the context of their medical condition is felt to place them at decreased risk for further clinical deterioration. Furthermore, it is anticipated that the patient will be medically stable for discharge from the hospital within 2 midnights of admission.     Marcelyn Bruins MD [facility]  How to contact the [facility] Attending or Consulting provider [place] or covering provider during after hours [place], for this patient?   Check the care team in [facility] and look for a) attending/consulting [facility] provider listed and b) the [facility] team listed Log into [email or web address] and use [place]'s universal password to access. If you do not have the password, please contact the hospital operator. Locate the [facility] provider you are looking for under [facility] and page to a number that you can be directly reached. If you still have difficulty reaching the provider, please page the [facility] (Director on Call) for the Hospitalists listed on amion for assistance.  08/20/2021, 12:30 AM

## 2021-08-20 NOTE — ED Notes (Signed)
Pt received breakfast 

## 2021-08-20 NOTE — Consult Note (Signed)
Cardiology Consultation:   Patient ID: Amanda Wilkins MRN: [id number]; DOB: Aug 26, 1946  Admit date: 08/19/2021 Date of Consult: 08/20/2021  PCP:  [name], MD   [facility] Providers Cardiologist:  [name], MD        Patient Profile:   Amanda Wilkins is a 75 y.o. female with a hx of PAF who is being seen 08/20/2021 for the evaluation of afib at the request of Dr Darrick Meigs.  History of Present Illness:   Amanda Wilkins 75 yo female history of PAF followed in afib clinic presents with palpitatoins.    Seen in afib clinic 08/15/21 with persistent afib. She increased atenoleol to 50mg  bid. Plan from afib clinic was to start multaq if price was affordable and then plan DCCV. Patient checked with insurance and multaq was not covered. Plan was for a lexiscan and if low risk start flecanide 50mg  bid.  Presented to ER feeling lightheaded, weak and short of breath with palpitations.   ER vitals HR 109 bp 132/69 WBC 7.6 Hgb 12.1 Plt 186 K 4.4 Cr 0.95 Mg 2 Trop 6-->6 CXR no acute process  EKG afib with RVR Past Medical History:  Diagnosis Date   Acute bronchitis 09/11/2016   11/17 refractory   Acute cystitis without hematuria 05/17/2015   Acute kidney injury [facility])    Labs today   Acute right ankle pain 09/09/2018   Anticoagulant long-term use    Xarelto   Axillary pain    Bronchitis    Cellulitis    Cholecystitis    Chronic interstitial nephritis    CONJUNCTIVITIS, ACUTE 10/18/2010   Qualifier: Diagnosis of  By: Alain Marion MD, Evie Lacks    D-dimer, elevated    Depression    Eye inflammation    History of interstitial nephritis 2016   chronic   History of nuclear stress test 12/16/2014   Intermediate risk nuclear study w/ medium size moderate severity reversible defect in the basal and mid inferolateral and inferior wall (per dr cardiology note , dr [name] did not think this was consistent with ischemia)/  normal LV function and wall motion, ef 75%    History of septic shock 01/22/2015   in setting Group A Strep Cellulits erysipelas/chest wall induration with streptoccocus basterium-- Severe sepsis, DIC, Acute respiratory failure with pulmonary edema, Acute Kidney failure with chronic interstitial nephritis   Hypertension    Hyponatremia    Migraines    on Zoloft for migraines   Mild carotid artery disease ([place])    per duplex 08-30-2017 bilateral ICA 1-39%   OA (osteoarthritis) rheumotologist-  dr [name]   both knees,  shoulders, ankles   OSA on CPAP     moderate obstructive sleep apnea with an AHI of 23.4/h and oxygen desaturations as low as 84%.  Now on CPAP at 12 cm H2O.   PAF (paroxysmal atrial fibrillation) [facility]) 2009   cardiologist-- dr [name]   Paroxysmal atrial flutter [facility])    a. dx 11/2017.   PSVT (paroxysmal supraventricular tachycardia) ([place])    Wears contact lenses     Past Surgical History:  Procedure Laterality Date   BREAST BIOPSY Right 05/15/2017   [place]   ESOPHAGOGASTRODUODENOSCOPY N/A 02/08/2015   Procedure: ESOPHAGOGASTRODUODENOSCOPY (EGD);  Surgeon: [name], MD;  Location: [place];  Service: Endoscopy;  Laterality: N/A;   KNEE ARTHROSCOPY W/ MENISCAL REPAIR Left 08/2014    @[facility]    SHOULDER SURGERY Right 04/04/2016   TOTAL KNEE  ARTHROPLASTY Right 09/01/2018   Procedure: RIGHT TOTAL KNEE ARTHROPLASTY;  Surgeon: [name], MD;  Location: [facility];  Service: Orthopedics;  Laterality: Right;      Inpatient Medications: Scheduled Meds:  apixaban  5 mg Oral BID   atenolol  50 mg Oral BID   hydroxychloroquine  200 mg Oral q morning   [START ON 08/21/2021] influenza vaccine adjuvanted  0.5 mL Intramuscular Tomorrow-1000   rosuvastatin  5 mg Oral Daily   sertraline  50 mg Oral Daily   Continuous Infusions:  diltiazem (CARDIZEM) infusion Stopped (08/20/21 1233)   PRN Meds: acetaminophen, ALPRAZolam, EPINEPHrine, ondansetron (ZOFRAN) IV  Allergies:     Allergies  Allergen Reactions   Epinephrine Base Other (See Comments)    Seriously increases heart rate   Aspirin Other (See Comments)    Can take the coated 325 mg. Plain asa 325 mg speeds up the heart.   Benadryl [Diphenhydramine]     Increased heart rate   Covid-19 Ad26 Vaccine(Janssen) Hives     hives 6 hrs after her COVID 19 2nd booster - resolved    Fish Allergy Hives    Was imitation crab meat and broke out in hives, had skin testing for lobster and showed allergic   Hylan G-F 20 Other (See Comments)    Very painful (knee injection) Very painful (knee injection)   Other Other (See Comments)   Penicillins Other (See Comments)    Pt previously has been told not to take because of family history of reactions (water blisters). 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)  Tobacco Use   Smoking status: Never   Smokeless tobacco: Never   Tobacco comments:    H/O social smoking at times when drinking--never a "habit" (in the 1970s)  Vaping Use   Vaping Use: Never used  Substance and Sexual Activity   Alcohol use: Yes    Alcohol/week: 0.0 standard drinks    Comment: Occasional   Drug use: Never   Sexual activity: Yes    Partners: Male    Birth control/protection: Post-menopausal    Comment: intercourse age 40, sexual partners more than 5  Other Topics Concern   Not on file  Social History Narrative   Not on file   Social Determinants of Health   Financial Resource Strain: Low Risk    Difficulty of Paying Living Expenses: Not hard at all  Food Insecurity: No Food Insecurity    Worried About Charity fundraiser in the Last Year: Never true   [place] in the Last Year: Never true  Transportation Needs: No Transportation Needs   Lack of Transportation (Medical): No   Lack of Transportation (Non-Medical): No  Physical Activity: Sufficiently Active   Days of Exercise per Week: 5 days   Minutes of Exercise per Session: 30 min  Stress: No Stress Concern Present   Feeling of Stress : Not at all  Social Connections: Moderately Isolated   Frequency of Communication with Friends and Family: More than three times a  week   Frequency of Social Gatherings with Friends and Family: More than three times a week   Attends Religious Services: Never   Marine scientist or Organizations: No   Attends Music therapist: Never   Marital Status: Married  Human resources officer Violence: Not At Risk   Fear of Current or Ex-Partner: No   Emotionally Abused: No   Physically Abused: No   Sexually Abused: No    Family History:    Family History  Problem Relation Age of Onset   Pancreatic cancer Brother    Lymphoma Mother    Prostate cancer Father        metastatic prostate cancer   Breast cancer Sister 3   Allergic rhinitis Sister    Urticaria Sister    Asthma Neg Hx    Eczema Neg Hx    Immunodeficiency Neg Hx    Atopy Neg Hx    Angioedema Neg Hx      ROS:  Please see the history of present illness.   All other ROS reviewed and negative.     Physical Exam/Data:   Vitals:   08/20/21 1145 08/20/21 1245 08/20/21 1300 08/20/21 1352  BP: 110/85 124/75 117/76 (!) 123/92  Pulse: (!) 59 70 69 91  Resp: (!) 26 20 (!) 23 20  Temp:    99.5 F (37.5 C)  TempSrc:    Oral  SpO2: 98% 97% 99% 98%    Intake/Output Summary (Last 24 hours) at 08/20/2021 1431 Last data filed at 08/20/2021 0917 Gross per 24 hour  Intake 220 ml  Output 1650 ml  Net -1430 ml   Last 3 Weights 08/15/2021 07/27/2021 07/26/2021  Weight (lbs) 240 lb 6.4 oz 241 lb 3.2 oz 243 lb   Weight (kg) 109.045 kg 109.408 kg 110.224 kg     There is no height or weight on file to calculate BMI.  General:  Well nourished, well developed, in no acute distress HEENT: normal Neck: no JVD Vascular: No carotid bruits; Distal pulses 2+ bilaterally Cardiac:  normal S1, S2; RRR; no murmur  Lungs:  clear to auscultation bilaterally, no wheezing, rhonchi or rales  Abd: soft, nontender, no hepatomegaly  Ext: no edema Musculoskeletal:  No deformities, BUE and BLE strength normal and equal Skin: warm and dry  Neuro:  CNs 2-12 intact, no focal abnormalities noted Psych:  Normal affect     Relevant CV Studies: Echo- 12/09/19- 1. Left ventricular ejection fraction, by estimation, is 55 to 60%. The  left ventricle has normal function. The left ventricle has no regional  wall motion abnormalities. There is mild concentric left ventricular  hypertrophy. Left ventricular diastolic  parameters were normal.   2. Right ventricular systolic function is normal. The right ventricular  size is normal. There is moderately elevated pulmonary artery systolic  pressure.   3. The mitral valve is normal in structure and function. Mild mitral  valve regurgitation. No evidence of mitral stenosis.   4. The aortic valve is tricuspid. Aortic valve regurgitation is not  visualized. No aortic stenosis is present.   5. The inferior vena cava is normal in size with <50% respiratory  variability, suggesting right atrial pressure of 8 mmHg.   Comparison(s): 01/25/15 EF 55-60%. PA pressure 9mmHg.   Laboratory Data:  High Sensitivity Troponin:   Recent Labs  Lab 08/19/21 2134 08/20/21 0755  TROPONINIHS 6 6     Chemistry Recent Labs  Lab 08/19/21 2134 08/20/21 0755  NA 132* 132*  K 4.4 4.0  CL 101 99  CO2 23 22  GLUCOSE 99 110*  BUN 13 6*  CREATININE 0.95 0.76  CALCIUM 9.2 9.5  MG 2.0  --   GFRNONAA >60 >60  ANIONGAP 8 11    Recent Labs  Lab 08/19/21 2134  PROT 6.7  ALBUMIN 3.9  AST  29  ALT 40  ALKPHOS 63  BILITOT 0.9   Lipids No results for input(s): CHOL, TRIG, HDL, LABVLDL, LDLCALC, CHOLHDL in the last 168 hours.  Hematology Recent Labs  Lab 08/19/21 2134 08/20/21 0755  WBC 7.6 7.6  RBC 3.59* 3.81*  HGB 12.1 12.7  HCT 36.1 38.2  MCV 100.6* 100.3*  MCH 33.7 33.3  MCHC 33.5 33.2  RDW 12.7 12.7  PLT 186 192   Thyroid No results for input(s): TSH, FREET4 in the last 168 hours.  BNPNo results for input(s): BNP, PROBNP in the last 168 hours.  DDimer No results for input(s): DDIMER in the last 168 hours.   Radiology/Studies:  DG Chest 2 View  Result Date: 08/19/2021 CLINICAL DATA:  AFib and lightheadedness. EXAM: CHEST - 2 VIEW COMPARISON:  Chest radiograph dated 10/20/2020. FINDINGS: Diffuse chronic interstitial coarsening. No focal consolidation, pleural effusion, or pneumothorax. The cardiac silhouette is within normal limits. Atherosclerotic calcification of the aorta. No acute osseous pathology. IMPRESSION: No active cardiopulmonary disease. Electronically Signed   By: Anner Crete M.D.   On: 08/19/2021 22:02     Assessment and Plan:   1.PAF - followed in afib clinic, noted recurrent afib at last visit that has been persistent and symptomatic for 1 week - plan was for multaq but insurance did not cover, therefore plan was changed to outpatient lexiscan and starting flecanide 50mg  bid if benign stress.  - in ER started on dilt gtt. She is on atenolol 50mg  bid at home and eliquis - of note 06/2019 coronary calcium score of 0. I think reasonable to go ahead and start flecanide without delaying for stress test. Continue home atenolol, continue dilt gtt for now and wean as able.     Risk Assessment/Risk Scores:      CHA2DS2-VASc Score = 4   This indicates a 4.8% annual risk of stroke. The patient's score is based upon: CHF History: 1 HTN History: 1 Diabetes History: 0 Stroke History: 0 Vascular Disease History: 0 Age Score: 1 Gender Score:  1     For questions or updates, please contact [place] Please consult [email or web address] for contact info under    Signed, Carlyle Dolly, MD  08/20/2021 2:31 PM

## 2021-08-20 NOTE — ED Notes (Signed)
Pt's BP dropped from 130s to mid 42A systolic, Cardizem drip rate change 15 to 10 mg/hr. Pt comfortable, RN will continue to monitor.

## 2021-08-20 NOTE — Progress Notes (Signed)
Pt said she didn't want to wear her CPAP tonight. Pt's vitals are stable and Pt is not in any distress. RT will continue to monitor as needed.

## 2021-08-21 ENCOUNTER — Telehealth (HOSPITAL_COMMUNITY): Payer: Self-pay | Admitting: Nurse Practitioner

## 2021-08-21 ENCOUNTER — Ambulatory Visit (HOSPITAL_COMMUNITY): Payer: Medicare Other

## 2021-08-21 DIAGNOSIS — I4891 Unspecified atrial fibrillation: Secondary | ICD-10-CM | POA: Diagnosis not present

## 2021-08-21 MED ORDER — METOPROLOL TARTRATE 25 MG PO TABS
25.0000 mg | ORAL_TABLET | Freq: Two times a day (BID) | ORAL | Status: DC
  Administered 2021-08-21 – 2021-08-22 (×2): 25 mg via ORAL
  Filled 2021-08-21 (×2): qty 1

## 2021-08-21 MED ORDER — DILTIAZEM HCL 60 MG PO TABS
60.0000 mg | ORAL_TABLET | Freq: Four times a day (QID) | ORAL | Status: DC
Start: 2021-08-21 — End: 2021-08-22
  Administered 2021-08-21 – 2021-08-22 (×4): 60 mg via ORAL
  Filled 2021-08-21 (×4): qty 1

## 2021-08-21 NOTE — Progress Notes (Addendum)
Patient would like to wait on Cardioversion tomorrow. Md Made aware.

## 2021-08-21 NOTE — Telephone Encounter (Signed)
Patient called and cancelled Myoview for reason below:  08/21/2021 10:23 AM [id number], Amanda Wilkins  Cancel Rsn: Patient (no longer needed per doctor)   Order will be removed from the ech/nuc WQ.

## 2021-08-21 NOTE — Progress Notes (Signed)
PROGRESS NOTE    Amanda Wilkins  ERX:[id number] DOB: Jun 11, 1946 DOA: 08/19/2021 PCP: [name], MD   Chief Complaint  Patient presents with   Atrial Fibrillation   Brief Narrative/Hospital Course:  Amanda Wilkins, 75 y.o. female with PMH of anxiety/depression, alopecia, RA, anemia, asthma, HLD, HTN, migraine, low back pain, OSA on CPAP A. fib, history of syncope, thrombocytopenia carotid artery disease presented after episode of lightheadedness weakness and shortness of breath, and has been having symptoms in the setting of recurrent A. fib seen on the clinic and discussed about possibly starting Multaq but not covered by insurance.  Patient continued to have lightheadedness dizziness and shortness of breath that worsened and also hypotensive at home and came to the ED  She has had 4 episodes of significant atrial fibrillation last 30 years. Today heart rate been fluctuating significantly between 100s to 140s.  She is on Eliquis and has been taking this consistently for the past 3 weeks.  ED Course: Vital signs in the ED significant for heart rate ranging in ED upper 80s to 140s, chest x-ray showed no acute abnormality.  Diltiazem drip started in the ED. Patient was admitted for further management and cardio consulted  Subjective:  Seen and examined this morning resting comfortably remains on Cardizem drip be converted to p.o. soon In A. fib.  No nausea vomiting chest pain.  Assessment & Plan:  Persistent A. fib with RVR: Appreciate cardiology input, on flecainide started on weekend, on Eliquis and Cardizem drip.  Being switched to oral Cardizem.  Cardiology planning for possible cardioversion tomorrow.  Discontinuing atenolol.  Continue to monitor in telemetry.  Hypertension HLD: BP is well controlled.  Continue Crestor, Cardizem.  Atenolol has been discontinued  Anxiety/depression: Mood is stable on sertraline  RA: On chloroquine  Mild hyponatremia  monitor  OSA continue CPAP bedtime  Class II Obesity:Patient's Body mass index is 39.71 kg/m. : Will benefit with PCP follow-up, weight loss  healthy lifestyle and outpatient sleep evaluation.  DVT prophylaxis:  Code Status:   Code Status: Full Code Family Communication: plan of care discussed with patient at bedside. Status is: Inpatient  Remains inpatient appropriate because: For ongoing management of persistent a fib and  possible cardioversion tomorrow She lives home with her husband and independent at baseline Anticipated disposition next 1 to 2 days  Objective: Vitals last 24 hrs: Vitals:   08/20/21 2040 08/21/21 0027 08/21/21 0414 08/21/21 0756  BP: 113/75 117/88 (!) 132/95 112/87  Pulse: 100 98 95 89  Resp: 18 19 20 18   Temp: 98.3 F (36.8 C) 98.3 F (36.8 C) 97.9 F (36.6 C) 97.7 F (36.5 C)  TempSrc: Oral Oral Oral Oral  SpO2:  96%  96%  Weight:   108.2 kg    Weight change:   Intake/Output Summary (Last 24 hours) at 08/21/2021 0858 Last data filed at 08/21/2021 0759 Gross per 24 hour  Intake 984.57 ml  Output 2950 ml  Net -1965.43 ml   Net IO Since Admission: -2,715.43 mL [08/21/21 0858]   Physical Examination: General exam: AA0x3, weak,older than stated age. HEENT:Oral mucosa moist, Ear/Nose WNL grossly,dentition normal. Respiratory system: B/l clear BS, no use of accessory muscle, non tender. Cardiovascular system: S1 & S2 +,No JVD. Gastrointestinal system: Abdomen soft, NT,ND, BS+. Nervous System:Alert, awake, moving extremities. Extremities: edema none, distal peripheral pulses palpable.  Skin: No rashes, no icterus. MSK: Normal muscle bulk, tone, power.  Medications reviewed:  Scheduled Meds:  apixaban  5 mg Oral BID  diltiazem  60 mg Oral Q6H   flecainide  50 mg Oral BID   hydroxychloroquine  200 mg Oral q morning   influenza vaccine adjuvanted  0.5 mL Intramuscular Tomorrow-1000   rosuvastatin  5 mg Oral Daily   sertraline  50 mg Oral  Daily   Continuous Infusions:  Diet Order             Diet Heart Room service appropriate? Yes; Fluid consistency: Thin  Diet effective now                          Weight change:   Wt Readings from Last 3 Encounters:  08/21/21 108.2 kg  08/15/21 109 kg  07/27/21 109.4 kg     Consultants:see note  Procedures:see note Antimicrobials: Anti-infectives (From admission, onward)    Start     Dose/Rate Route Frequency Ordered Stop   08/20/21 1000  hydroxychloroquine (PLAQUENIL) tablet 200 mg        200 mg Oral Every morning 08/20/21 0025        Culture/Microbiology    Component Value Date/Time   SDES URINE, CATHETERIZED 01/24/2015 1402   SPECREQUEST NONE 01/24/2015 1402   CULT NO GROWTH Performed at [facility]  01/24/2015 1402   REPTSTATUS 01/26/2015 FINAL 01/24/2015 1402    Other culture-see note  Unresulted Labs (From admission, onward)    None      Data Reviewed: I have personally reviewed following labs and imaging studies CBC: Recent Labs  Lab 08/19/21 2134 08/20/21 0755  WBC 7.6 7.6  HGB 12.1 12.7  HCT 36.1 38.2  MCV 100.6* 100.3*  PLT 186 106   Basic Metabolic Panel: Recent Labs  Lab 08/19/21 2134 08/20/21 0755  NA 132* 132*  K 4.4 4.0  CL 101 99  CO2 23 22  GLUCOSE 99 110*  BUN 13 6*  CREATININE 0.95 0.76  CALCIUM 9.2 9.5  MG 2.0  --    GFR: Estimated Creatinine Clearance: 75.5 mL/min (by C-G formula based on SCr of 0.76 mg/dL). Liver Function Tests: Recent Labs  Lab 08/19/21 2134  AST 29  ALT 40  ALKPHOS 63  BILITOT 0.9  PROT 6.7  ALBUMIN 3.9   No results for input(s): LIPASE, AMYLASE in the last 168 hours. No results for input(s): AMMONIA in the last 168 hours. Coagulation Profile: No results for input(s): INR, PROTIME in the last 168 hours. Cardiac Enzymes: No results for input(s): CKTOTAL, CKMB, CKMBINDEX, TROPONINI in the last 168 hours. BNP (last 3 results) No results for input(s): PROBNP in the last  8760 hours. HbA1C: No results for input(s): HGBA1C in the last 72 hours. CBG: No results for input(s): GLUCAP in the last 168 hours. Lipid Profile: No results for input(s): CHOL, HDL, LDLCALC, TRIG, CHOLHDL, LDLDIRECT in the last 72 hours. Thyroid Function Tests: No results for input(s): TSH, T4TOTAL, FREET4, T3FREE, THYROIDAB in the last 72 hours. Anemia Panel: No results for input(s): VITAMINB12, FOLATE, FERRITIN, TIBC, IRON, RETICCTPCT in the last 72 hours. Sepsis Labs: No results for input(s): PROCALCITON, LATICACIDVEN in the last 168 hours.  Recent Results (from the past 240 hour(s))  Resp Panel by RT-PCR (Flu A&B, Covid) Nasopharyngeal Swab     Status: None   Collection Time: 08/19/21 10:40 PM   Specimen: Nasopharyngeal Swab; Nasopharyngeal(NP) swabs in vial transport medium  Result Value Ref Range Status   SARS Coronavirus 2 by RT PCR NEGATIVE NEGATIVE Final    Comment: (NOTE) SARS-CoV-2  target nucleic acids are NOT DETECTED.  The SARS-CoV-2 RNA is generally detectable in upper respiratory specimens during the acute phase of infection. The lowest concentration of SARS-CoV-2 viral copies this assay can detect is 138 copies/mL. A negative result does not preclude SARS-Cov-2 infection and should not be used as the sole basis for treatment or other patient management decisions. A negative result may occur with  improper specimen collection/handling, submission of specimen other than nasopharyngeal swab, presence of viral mutation(s) within the areas targeted by this assay, and inadequate number of viral copies(<138 copies/mL). A negative result must be combined with clinical observations, patient history, and epidemiological information. The expected result is Negative.  Fact Sheet for Patients:  EntrepreneurPulse.com.au  Fact Sheet for Healthcare Providers:  IncredibleEmployment.be  This test is no t yet approved or cleared by the  [place] FDA and  has been authorized for detection and/or diagnosis of SARS-CoV-2 by FDA under an Emergency Use Authorization (EUA). This EUA will remain  in effect (meaning this test can be used) for the duration of the COVID-19 declaration under Section 564(b)(1) of the Act, 21 U.S.C.section 360bbb-3(b)(1), unless the authorization is terminated  or revoked sooner.       Influenza A by PCR NEGATIVE NEGATIVE Final   Influenza B by PCR NEGATIVE NEGATIVE Final    Comment: (NOTE) The Xpert Xpress SARS-CoV-2/FLU/RSV plus assay is intended as an aid in the diagnosis of influenza from Nasopharyngeal swab specimens and should not be used as a sole basis for treatment. Nasal washings and aspirates are unacceptable for Xpert Xpress SARS-CoV-2/FLU/RSV testing.  Fact Sheet for Patients: EntrepreneurPulse.com.au  Fact Sheet for Healthcare Providers: IncredibleEmployment.be  This test is not yet approved or cleared by the [place] FDA and has been authorized for detection and/or diagnosis of SARS-CoV-2 by FDA under an Emergency Use Authorization (EUA). This EUA will remain in effect (meaning this test can be used) for the duration of the COVID-19 declaration under Section 564(b)(1) of the Act, 21 U.S.C. section 360bbb-3(b)(1), unless the authorization is terminated or revoked.  Performed at [facility], [place] [address], [place], [place] [address]      Radiology Studies: DG Chest 2 View  Result Date: 08/19/2021 CLINICAL DATA:  AFib and lightheadedness. EXAM: CHEST - 2 VIEW COMPARISON:  Chest radiograph dated 10/20/2020. FINDINGS: Diffuse chronic interstitial coarsening. No focal consolidation, pleural effusion, or pneumothorax. The cardiac silhouette is within normal limits. Atherosclerotic calcification of the aorta. No acute osseous pathology. IMPRESSION: No active cardiopulmonary disease. Electronically Signed   By: Anner Crete  M.D.   On: 08/19/2021 22:02     LOS: 1 day   Antonieta Pert, MD [facility]  08/21/2021, 8:58 AM

## 2021-08-21 NOTE — Plan of Care (Signed)

## 2021-08-21 NOTE — Significant Event (Signed)
Patient up walking in the room 100-140s aFib. Without dizziness or lightlessness.

## 2021-08-21 NOTE — Progress Notes (Signed)
Cardiology Progress Note  Patient ID: Amanda Wilkins MRN: [id number] DOB: [birth year] Date of Encounter: 08/21/2021  Primary Cardiologist: [name], MD  Subjective   Chief Complaint: SOB  HPI: Remains in A. fib.  Rates are controlled on diltiazem drip.  Still weak and fatigued.  Started on flecainide.  ROS:  All other ROS reviewed and negative. Pertinent positives noted in the HPI.     Inpatient Medications  Scheduled Meds:  apixaban  5 mg Oral BID   atenolol  50 mg Oral BID   flecainide  50 mg Oral BID   hydroxychloroquine  200 mg Oral q morning   influenza vaccine adjuvanted  0.5 mL Intramuscular Tomorrow-1000   rosuvastatin  5 mg Oral Daily   sertraline  50 mg Oral Daily   Continuous Infusions:  diltiazem (CARDIZEM) infusion 7.5 mg/hr (08/21/21 0614)   PRN Meds: acetaminophen, ALPRAZolam, EPINEPHrine, ondansetron (ZOFRAN) IV   Vital Signs   Vitals:   08/20/21 2040 08/21/21 0027 08/21/21 0414 08/21/21 0756  BP: 113/75 117/88 (!) 132/95 112/87  Pulse: 100 98 95 89  Resp: 18 19 20 18   Temp: 98.3 F (36.8 C) 98.3 F (36.8 C) 97.9 F (36.6 C) 97.7 F (36.5 C)  TempSrc: Oral Oral Oral Oral  SpO2:  96%  96%  Weight:   108.2 kg     Intake/Output Summary (Last 24 hours) at 08/21/2021 0825 Last data filed at 08/21/2021 0759 Gross per 24 hour  Intake 984.57 ml  Output 2950 ml  Net -1965.43 ml   Last 3 Weights 08/21/2021 08/15/2021 07/27/2021  Weight (lbs) 238 lb 9.6 oz 240 lb 6.4 oz 241 lb 3.2 oz  Weight (kg) 108.228 kg 109.045 kg 109.408 kg      Telemetry  Overnight telemetry shows Afib 139 bpm, which I personally reviewed.   ECG  The most recent ECG shows Afib 90-110 bpm, which I personally reviewed.   Physical Exam   Vitals:   08/20/21 2040 08/21/21 0027 08/21/21 0414 08/21/21 0756  BP: 113/75 117/88 (!) 132/95 112/87  Pulse: 100 98 95 89  Resp: 18 19 20 18   Temp: 98.3 F (36.8 C) 98.3 F (36.8 C) 97.9 F (36.6 C) 97.7 F (36.5 C)  TempSrc:  Oral Oral Oral Oral  SpO2:  96%  96%  Weight:   108.2 kg     Intake/Output Summary (Last 24 hours) at 08/21/2021 0825 Last data filed at 08/21/2021 0759 Gross per 24 hour  Intake 984.57 ml  Output 2950 ml  Net -1965.43 ml    Last 3 Weights 08/21/2021 08/15/2021 07/27/2021  Weight (lbs) 238 lb 9.6 oz 240 lb 6.4 oz 241 lb 3.2 oz  Weight (kg) 108.228 kg 109.045 kg 109.408 kg    Body mass index is 39.71 kg/m.   General: Well nourished, well developed, in no acute distress Head: Atraumatic, normal size  Eyes: PEERLA, EOMI  Neck: Supple, no JVD Endocrine: No thryomegaly Cardiac: Normal S1, S2; irregular rhythm Lungs: Clear to auscultation bilaterally, no wheezing, rhonchi or rales  Abd: Soft, nontender, no hepatomegaly  Ext: No edema, pulses 2+ Musculoskeletal: No deformities, BUE and BLE strength normal and equal Skin: Warm and dry, no rashes   Neuro: Alert and oriented to person, place, time, and situation, CNII-XII grossly intact, no focal deficits  Psych: Normal mood and affect   Labs  High Sensitivity Troponin:   Recent Labs  Lab 08/19/21 2134 08/20/21 0755  TROPONINIHS 6 6     Cardiac EnzymesNo results for input(s):  TROPONINI in the last 168 hours. No results for input(s): TROPIPOC in the last 168 hours.  Chemistry Recent Labs  Lab 08/19/21 2134 08/20/21 0755  NA 132* 132*  K 4.4 4.0  CL 101 99  CO2 23 22  GLUCOSE 99 110*  BUN 13 6*  CREATININE 0.95 0.76  CALCIUM 9.2 9.5  PROT 6.7  --   ALBUMIN 3.9  --   AST 29  --   ALT 40  --   ALKPHOS 63  --   BILITOT 0.9  --   GFRNONAA >60 >60  ANIONGAP 8 11    Hematology Recent Labs  Lab 08/19/21 2134 08/20/21 0755  WBC 7.6 7.6  RBC 3.59* 3.81*  HGB 12.1 12.7  HCT 36.1 38.2  MCV 100.6* 100.3*  MCH 33.7 33.3  MCHC 33.5 33.2  RDW 12.7 12.7  PLT 186 192   BNPNo results for input(s): BNP, PROBNP in the last 168 hours.  DDimer No results for input(s): DDIMER in the last 168 hours.   Radiology  DG Chest 2  View  Result Date: 08/19/2021 CLINICAL DATA:  AFib and lightheadedness. EXAM: CHEST - 2 VIEW COMPARISON:  Chest radiograph dated 10/20/2020. FINDINGS: Diffuse chronic interstitial coarsening. No focal consolidation, pleural effusion, or pneumothorax. The cardiac silhouette is within normal limits. Atherosclerotic calcification of the aorta. No acute osseous pathology. IMPRESSION: No active cardiopulmonary disease. Electronically Signed   By: Anner Crete M.D.   On: 08/19/2021 22:02    Cardiac Studies  TTE 12/09/2019  1. Left ventricular ejection fraction, by estimation, is 55 to 60%. The  left ventricle has normal function. The left ventricle has no regional  wall motion abnormalities. There is mild concentric left ventricular  hypertrophy. Left ventricular diastolic  parameters were normal.   2. Right ventricular systolic function is normal. The right ventricular  size is normal. There is moderately elevated pulmonary artery systolic  pressure.   3. The mitral valve is normal in structure and function. Mild mitral  valve regurgitation. No evidence of mitral stenosis.   4. The aortic valve is tricuspid. Aortic valve regurgitation is not  visualized. No aortic stenosis is present.   5. The inferior vena cava is normal in size with <50% respiratory  variability, suggesting right atrial pressure of 8 mmHg.   Patient Profile  Amanda Wilkins is a 75 y.o. female with pAF, obesity, OSA who was admitted on 08/20/2021 with weakness and recurrence of atrial fibrillation.  Assessment & Plan   #Persistent atrial fibrillation -Remains on Eliquis.  No bleeding. -Remains in A. fib with heart rates between 90 to 110 bpm.  Started on flecainide by the weekend team. -Given that her symptoms could be related to A. fib we discussed cardioversion this admission.  She is had no missed doses of Eliquis in the last 3 weeks.  She was recently started on flecainide yesterday. CAC score of 0. I agree she  likely does not have CAD. Can pursue outpatient stress test but will not delay her DCCV.  -We will transition her to diltiazem 60 mg every 6 hours and get her off the diltiazem drip.  Stop atenolol. -We will attempt to have a cardioversion performed tomorrow.  We will keep her n.p.o. at midnight.  She cannot undergo cardioversion tomorrow we may just need to rate control her and send her home for return for outpatient cardioversion.  We will keep her updated on the plan.  Shared Decision Making/Informed Consent The risks (stroke, cardiac arrhythmias rarely  resulting in the need for a temporary or permanent pacemaker, skin irritation or burns and complications associated with conscious sedation including aspiration, arrhythmia, respiratory failure and death), benefits (restoration of normal sinus rhythm) and alternatives of a direct current cardioversion were explained in detail to Ms. Bungert and she agrees to proceed.   For questions or updates, please contact [facility] Please consult [email or web address] for contact info under   Time Spent with Patient: I have spent a total of 35 minutes with patient reviewing hospital notes, telemetry, EKGs, labs and examining the patient as well as establishing an assessment and plan that was discussed with the patient.  > 50% of time was spent in direct patient care.    Signed, Addison Naegeli. Audie Box, MD, [place]  08/21/2021 8:25 AM

## 2021-08-22 ENCOUNTER — Other Ambulatory Visit (HOSPITAL_COMMUNITY): Payer: Self-pay

## 2021-08-22 ENCOUNTER — Encounter (HOSPITAL_COMMUNITY): Payer: Self-pay | Admitting: Anesthesiology

## 2021-08-22 ENCOUNTER — Encounter (HOSPITAL_COMMUNITY)
Admission: EM | Disposition: A | Discharge status: Home / Self Care | Payer: Self-pay | Source: Home / Self Care | Attending: Internal Medicine

## 2021-08-22 ENCOUNTER — Ambulatory Visit (HOSPITAL_COMMUNITY): Payer: Medicare Other

## 2021-08-22 ENCOUNTER — Telehealth: Payer: Self-pay

## 2021-08-22 DIAGNOSIS — I4891 Unspecified atrial fibrillation: Secondary | ICD-10-CM | POA: Diagnosis not present

## 2021-08-22 SURGERY — CANCELLED PROCEDURE

## 2021-08-22 MED ORDER — METOPROLOL TARTRATE 25 MG PO TABS
25.0000 mg | ORAL_TABLET | Freq: Two times a day (BID) | ORAL | 0 refills | Status: DC
  Filled 2021-08-22: qty 60, 30d supply, fill #0

## 2021-08-22 MED ORDER — FLECAINIDE ACETATE 50 MG PO TABS
50.0000 mg | ORAL_TABLET | Freq: Two times a day (BID) | ORAL | 0 refills | Status: DC
  Filled 2021-08-22: qty 60, 30d supply, fill #0

## 2021-08-22 MED ORDER — DILTIAZEM HCL ER COATED BEADS 240 MG PO CP24
240.0000 mg | ORAL_CAPSULE | Freq: Every day | ORAL | Status: DC
  Administered 2021-08-22: 240 mg via ORAL
  Filled 2021-08-22: qty 1

## 2021-08-22 MED ORDER — DILTIAZEM HCL ER COATED BEADS 240 MG PO CP24
240.0000 mg | ORAL_CAPSULE | Freq: Every day | ORAL | 0 refills | Status: DC
  Filled 2021-08-22: qty 30, 30d supply, fill #0

## 2021-08-22 NOTE — Discharge Summary (Signed)
Physician Discharge Summary  Amanda Wilkins NTI:[id number] DOB: 02/23/1946 DOA: 08/19/2021  PCP: [name], MD  Admit date: 08/19/2021 Discharge date: 08/22/2021  Admitted From: home Disposition:  home  Recommendations for Outpatient Follow-up:  Follow up with PCP in 1-2 weeks and a fib clininc in few days  Home Health:no  Equipment/Devices: non  Discharge Condition: Stable Code Status:   Code Status: Full Code Diet recommendation:  Diet Order             Diet regular Room service appropriate? Yes; Fluid consistency: Thin  Diet effective now                    Brief/Interim Summary: Amanda Wilkins, 75 y.o. female with PMH of anxiety/depression, alopecia, RA, anemia, asthma, HLD, HTN, migraine, low back pain, OSA on CPAP A. fib, history of syncope, thrombocytopenia carotid artery disease presented after episode of lightheadedness weakness and shortness of breath, and has been having symptoms in the setting of recurrent A. fib seen on the clinic and discussed about possibly starting Multaq but not covered by insurance.  Patient continued to have lightheadedness dizziness and shortness of breath that worsened and also hypotensive at home and came to the ED  She has had 4 episodes of significant atrial fibrillation last 30 years. Today heart rate been fluctuating significantly between 100s to 140s.  She is on Eliquis and has been taking this consistently for the past 3 weeks. ED Course: Vital signs in the ED significant for heart rate ranging in ED upper 80s to 140s, chest x-ray showed no acute abnormality.  Diltiazem drip started in the ED. Patient was admitted for further management and cardio consulted Started on flecainide since 11/6, already on  Eliquis and Cardizem drip weaned to po 11/7 and started on metoprolol bid. TEE DCCV recommended but she declined and will fu at a fib clinic, seen by cardio this am and okay for home today. Patient asking to go home  today  Discharge Diagnoses:   Persistent A. fib with [id number] cardiology input, on flecainide since 11/6, already on  Eliquis and Cardizem drip weaned to po 11/7.  Off atenolol.and started on metoprolol bid Appreciate cardiology -TEE DCCV recommended but she declined and will fu at a fib clinic, seen by cardio this am and okay for home.  Hypertension HLD: BP is well controlled.  Continue Crestor, Cardizem.  Atenolol has been discontinued, now on metoprolol.  Anxiety/depression: Mood is stable on home sertraline  RA: cont chloroquine  Mild hyponatremia monitor labs.  OSA continue CPAP bedtime  Class II Obesity:Patient's Body mass index is 39.42 kg/m. : Will benefit with PCP follow-up, weight loss  healthy lifestyle and outpatient sleep evaluation.  Consults: cardiology  Subjective:  Aaox3, hr in 80s a fib. No complaints wants to go home today Discharge Exam: Vitals:   08/22/21 0522 08/22/21 0900  BP: 125/88 124/67  Pulse: 88 83  Resp: 19   Temp: 98 F (36.7 C)   SpO2: 98%    General: Pt is alert, awake, not in acute distress Cardiovascular: RRR, S1/S2 +, no rubs, no gallops Respiratory: CTA bilaterally, no wheezing, no rhonchi Abdominal: Soft, NT, ND, bowel sounds + Extremities: no edema, no cyanosis  Discharge Instructions  Discharge Instructions     Discharge instructions   Complete by: As directed    Please call call MD or return to ER for similar or worsening recurring problem that brought you to hospital or if any fever,nausea/vomiting,abdominal pain, uncontrolled  pain, chest pain, palpitation, fatigue, shortness of breath or any other alarming symptoms.  Please follow-up your doctor as instructed in a week time and call the office for appointment.  Please avoid alcohol, smoking, or any other illicit substance and maintain healthy habits including taking your regular medications as prescribed.  You were cared for by a hospitalist during your hospital  stay. If you have any questions about your discharge medications or the care you received while you were in the hospital after you are discharged, you can call the unit and ask to speak with the hospitalist on call if the hospitalist that took care of you is not available.  Once you are discharged, your primary care physician will handle any further medical issues. Please note that NO REFILLS for any discharge medications will be authorized once you are discharged, as it is imperative that you return to your primary care physician (or establish a relationship with a primary care physician if you do not have one) for your aftercare needs so that they can reassess your need for medications and monitor your lab values   Increase activity slowly   Complete by: As directed       Allergies as of 08/22/2021       Reactions   Epinephrine Base Other (See Comments)   Seriously increases heart rate   Aspirin Other (See Comments)   Can take the coated 325 mg. Plain asa 325 mg speeds up the heart.   Benadryl [diphenhydramine]    Increased heart rate   Covid-19 Ad26 Vaccine(janssen) Hives    hives 6 hrs after her COVID 19 2nd booster - resolved   Fish Allergy Hives   Was imitation crab meat and broke out in hives, had skin testing for lobster and showed allergic   Hylan G-f 20 Other (See Comments)   Very painful (knee injection) Very painful (knee injection)   Other Other (See Comments)   Penicillins Other (See Comments)   Pt previously has been told not to take because of family history of reactions (water blisters). She took amoxicillin in 2012-2013 with no reaction   Tape Other (See Comments)   Blisters from adhesive tape        Medication List     STOP taking these medications    atenolol 50 MG tablet Commonly known as: TENORMIN       TAKE these medications    acetaminophen 500 MG tablet Commonly known as: TYLENOL Take 1,000 mg by mouth every 8 (eight) hours as needed (pain).    ALPRAZolam 0.25 MG tablet Commonly known as: XANAX Take 1 tablet (0.25 mg total) by mouth 2 (two) times daily as needed for anxiety.   apixaban 5 MG Tabs tablet Commonly known as: ELIQUIS Take 1 tablet (5 mg total) by mouth 2 (two) times daily.   cholecalciferol 25 MCG (1000 UNIT) tablet Commonly known as: VITAMIN D3 Take 1,000 Units by mouth daily.   diltiazem 240 MG 24 hr capsule Commonly known as: CARDIZEM CD Take 1 capsule (240 mg total) by mouth daily. Start taking on: August 23, 2021   EPINEPHrine 0.3 mg/0.3 mL Soaj injection Commonly known as: EPI-PEN Inject 0.3 mg into the muscle as needed for anaphylaxis. As needed for life-threatening allergic reactions   flecainide 50 MG tablet Commonly known as: TAMBOCOR Take 1 tablet (50 mg total) by mouth 2 (two) times daily.   hydroxychloroquine 200 MG tablet Commonly known as: PLAQUENIL Take 400 mg by mouth every morning.  metoprolol tartrate 25 MG tablet Commonly known as: LOPRESSOR Take 1 tablet (25 mg total) by mouth 2 (two) times daily.   multivitamin capsule Take 1 capsule by mouth daily.   rosuvastatin 5 MG tablet Commonly known as: CRESTOR Take 1 tablet (5 mg total) by mouth daily.   sertraline 50 MG tablet Commonly known as: ZOLOFT TAKE 1 TABLET BY MOUTH DAILY        Follow-up Information     Plotnikov, Evie Lacks, MD Follow up in 1 week(s).   Specialty: Internal Medicine Contact information: [place] [address] [phone]         Fay Records, MD .   Specialty: Cardiology Contact information: [address] [place] [address] [phone]                Allergies  Allergen Reactions   Epinephrine Base Other (See Comments)    Seriously increases heart rate   Aspirin Other (See Comments)    Can take the coated 325 mg. Plain asa 325 mg speeds up the heart.   Benadryl [Diphenhydramine]     Increased heart rate   Covid-19 Ad26  Vaccine(Janssen) Hives     hives 6 hrs after her COVID 19 2nd booster - resolved    Fish Allergy Hives    Was imitation crab meat and broke out in hives, had skin testing for lobster and showed allergic   Hylan G-F 20 Other (See Comments)    Very painful (knee injection) Very painful (knee injection)   Other Other (See Comments)   Penicillins Other (See Comments)    Pt previously has been told not to take because of family history of reactions (water blisters). She took amoxicillin in 2012-2013 with no reaction   Tape Other (See Comments)    Blisters from adhesive tape    The results of significant diagnostics from this hospitalization (including imaging, microbiology, ancillary and laboratory) are listed below for reference.    Microbiology: Recent Results (from the past 240 hour(s))  Resp Panel by RT-PCR (Flu A&B, Covid) Nasopharyngeal Swab     Status: None   Collection Time: 08/19/21 10:40 PM   Specimen: Nasopharyngeal Swab; Nasopharyngeal(NP) swabs in vial transport medium  Result Value Ref Range Status   SARS Coronavirus 2 by RT PCR NEGATIVE NEGATIVE Final    Comment: (NOTE) SARS-CoV-2 target nucleic acids are NOT DETECTED.  The SARS-CoV-2 RNA is generally detectable in upper respiratory specimens during the acute phase of infection. The lowest concentration of SARS-CoV-2 viral copies this assay can detect is 138 copies/mL. A negative result does not preclude SARS-Cov-2 infection and should not be used as the sole basis for treatment or other patient management decisions. A negative result may occur with  improper specimen collection/handling, submission of specimen other than nasopharyngeal swab, presence of viral mutation(s) within the areas targeted by this assay, and inadequate number of viral copies(<138 copies/mL). A negative result must be combined with clinical observations, patient history, and epidemiological information. The expected result is Negative.  Fact  Sheet for Patients:  EntrepreneurPulse.com.au  Fact Sheet for Healthcare Providers:  IncredibleEmployment.be  This test is no t yet approved or cleared by the [place] FDA and  has been authorized for detection and/or diagnosis of SARS-CoV-2 by FDA under an Emergency Use Authorization (EUA). This EUA will remain  in effect (meaning this test can be used) for the duration of the COVID-19 declaration under Section 564(b)(1) of the Act, 21  U.S.C.section 360bbb-3(b)(1), unless the authorization is terminated  or revoked sooner.       Influenza A by PCR NEGATIVE NEGATIVE Final   Influenza B by PCR NEGATIVE NEGATIVE Final    Comment: (NOTE) The Xpert Xpress SARS-CoV-2/FLU/RSV plus assay is intended as an aid in the diagnosis of influenza from Nasopharyngeal swab specimens and should not be used as a sole basis for treatment. Nasal washings and aspirates are unacceptable for Xpert Xpress SARS-CoV-2/FLU/RSV testing.  Fact Sheet for Patients: EntrepreneurPulse.com.au  Fact Sheet for Healthcare Providers: IncredibleEmployment.be  This test is not yet approved or cleared by the [place] FDA and has been authorized for detection and/or diagnosis of SARS-CoV-2 by FDA under an Emergency Use Authorization (EUA). This EUA will remain in effect (meaning this test can be used) for the duration of the COVID-19 declaration under Section 564(b)(1) of the Act, 21 U.S.C. section 360bbb-3(b)(1), unless the authorization is terminated or revoked.  Performed at [facility], [place] [address], [place], [place] [address]     Procedures/Studies: DG Chest 2 View  Result Date: 08/19/2021 CLINICAL DATA:  AFib and lightheadedness. EXAM: CHEST - 2 VIEW COMPARISON:  Chest radiograph dated 10/20/2020. FINDINGS: Diffuse chronic interstitial coarsening. No focal consolidation, pleural effusion, or pneumothorax. The cardiac  silhouette is within normal limits. Atherosclerotic calcification of the aorta. No acute osseous pathology. IMPRESSION: No active cardiopulmonary disease. Electronically Signed   By: Anner Crete M.D.   On: 08/19/2021 22:02   XR Knee 1-2 Views Right  Result Date: 07/26/2021 2 views of the right knee show well-seated total knee arthroplasty with no complicating features.   Labs: BNP (last 3 results) No results for input(s): BNP in the last 8760 hours. Basic Metabolic Panel: Recent Labs  Lab 08/19/21 2134 08/20/21 0755  NA 132* 132*  K 4.4 4.0  CL 101 99  CO2 23 22  GLUCOSE 99 110*  BUN 13 6*  CREATININE 0.95 0.76  CALCIUM 9.2 9.5  MG 2.0  --    Liver Function Tests: Recent Labs  Lab 08/19/21 2134  AST 29  ALT 40  ALKPHOS 63  BILITOT 0.9  PROT 6.7  ALBUMIN 3.9   No results for input(s): LIPASE, AMYLASE in the last 168 hours. No results for input(s): AMMONIA in the last 168 hours. CBC: Recent Labs  Lab 08/19/21 2134 08/20/21 0755  WBC 7.6 7.6  HGB 12.1 12.7  HCT 36.1 38.2  MCV 100.6* 100.3*  PLT 186 192   Cardiac Enzymes: No results for input(s): CKTOTAL, CKMB, CKMBINDEX, TROPONINI in the last 168 hours. BNP: Invalid input(s): POCBNP CBG: No results for input(s): GLUCAP in the last 168 hours. D-Dimer No results for input(s): DDIMER in the last 72 hours. Hgb A1c No results for input(s): HGBA1C in the last 72 hours. Lipid Profile No results for input(s): CHOL, HDL, LDLCALC, TRIG, CHOLHDL, LDLDIRECT in the last 72 hours. Thyroid function studies No results for input(s): TSH, T4TOTAL, T3FREE, THYROIDAB in the last 72 hours.  Invalid input(s): FREET3 Anemia work up No results for input(s): VITAMINB12, FOLATE, FERRITIN, TIBC, IRON, RETICCTPCT in the last 72 hours. Urinalysis    Component Value Date/Time   COLORURINE YELLOW 04/07/2021 1352   APPEARANCEUR CLEAR 04/07/2021 1352   LABSPEC 1.010 04/07/2021 1352   PHURINE 6.5 04/07/2021 1352   GLUCOSEU  NEGATIVE 04/07/2021 1352   GLUCOSEU NEGATIVE 02/26/2017 [place] 04/07/2021 1352   BILIRUBINUR neg 05/19/2020 [place] 04/07/2021 1352  PROTEINUR NEGATIVE 04/07/2021 1352   UROBILINOGEN 0.2 05/19/2020 1452   UROBILINOGEN 0.2 02/26/2017 1212   NITRITE neg 05/19/2020 1452   NITRITE NEGATIVE 02/26/2017 1212   LEUKOCYTESUR Negative 05/19/2020 1452   Sepsis Labs Invalid input(s): PROCALCITONIN,  WBC,  LACTICIDVEN Microbiology Recent Results (from the past 240 hour(s))  Resp Panel by RT-PCR (Flu A&B, Covid) Nasopharyngeal Swab     Status: None   Collection Time: 08/19/21 10:40 PM   Specimen: Nasopharyngeal Swab; Nasopharyngeal(NP) swabs in vial transport medium  Result Value Ref Range Status   SARS Coronavirus 2 by RT PCR NEGATIVE NEGATIVE Final    Comment: (NOTE) SARS-CoV-2 target nucleic acids are NOT DETECTED.  The SARS-CoV-2 RNA is generally detectable in upper respiratory specimens during the acute phase of infection. The lowest concentration of SARS-CoV-2 viral copies this assay can detect is 138 copies/mL. A negative result does not preclude SARS-Cov-2 infection and should not be used as the sole basis for treatment or other patient management decisions. A negative result may occur with  improper specimen collection/handling, submission of specimen other than nasopharyngeal swab, presence of viral mutation(s) within the areas targeted by this assay, and inadequate number of viral copies(<138 copies/mL). A negative result must be combined with clinical observations, patient history, and epidemiological information. The expected result is Negative.  Fact Sheet for Patients:  EntrepreneurPulse.com.au  Fact Sheet for Healthcare Providers:  IncredibleEmployment.be  This test is no t yet approved or cleared by the [place] FDA and  has been authorized for detection and/or diagnosis of SARS-CoV-2 by FDA  under an Emergency Use Authorization (EUA). This EUA will remain  in effect (meaning this test can be used) for the duration of the COVID-19 declaration under Section 564(b)(1) of the Act, 21 U.S.C.section 360bbb-3(b)(1), unless the authorization is terminated  or revoked sooner.       Influenza A by PCR NEGATIVE NEGATIVE Final   Influenza B by PCR NEGATIVE NEGATIVE Final    Comment: (NOTE) The Xpert Xpress SARS-CoV-2/FLU/RSV plus assay is intended as an aid in the diagnosis of influenza from Nasopharyngeal swab specimens and should not be used as a sole basis for treatment. Nasal washings and aspirates are unacceptable for Xpert Xpress SARS-CoV-2/FLU/RSV testing.  Fact Sheet for Patients: EntrepreneurPulse.com.au  Fact Sheet for Healthcare Providers: IncredibleEmployment.be  This test is not yet approved or cleared by the [place] FDA and has been authorized for detection and/or diagnosis of SARS-CoV-2 by FDA under an Emergency Use Authorization (EUA). This EUA will remain in effect (meaning this test can be used) for the duration of the COVID-19 declaration under Section 564(b)(1) of the Act, 21 U.S.C. section 360bbb-3(b)(1), unless the authorization is terminated or revoked.  Performed at [facility], [place] [address], [place], [place] [address]      Time coordinating discharge: 25 minutes  SIGNED: Antonieta Pert, MD  [facility] 08/22/2021, 9:30 AM  If 7PM-7AM, please contact night-coverage [email or web address]

## 2021-08-22 NOTE — Progress Notes (Signed)
Cardiology Progress Note  Patient ID: Amanda Wilkins MRN: [id number] DOB: December 24, 1945 Date of Encounter: 08/22/2021  Primary Cardiologist: [name], MD  Subjective   Chief Complaint: Anxiety about DCCV  HPI: Heart rates much improved 80-100 bpm.  She has not wanted to do cardioversion.  We discussed discharge with rate control strategy on flecainide and follow-up in A. fib clinic.  She agrees with this.  ROS:  All other ROS reviewed and negative. Pertinent positives noted in the HPI.     Inpatient Medications  Scheduled Meds:  apixaban  5 mg Oral BID   diltiazem  240 mg Oral Daily   flecainide  50 mg Oral BID   hydroxychloroquine  200 mg Oral q morning   influenza vaccine adjuvanted  0.5 mL Intramuscular Tomorrow-1000   metoprolol tartrate  25 mg Oral BID   rosuvastatin  5 mg Oral Daily   sertraline  50 mg Oral Daily   Continuous Infusions:  PRN Meds: acetaminophen, ALPRAZolam, EPINEPHrine, ondansetron (ZOFRAN) IV   Vital Signs   Vitals:   08/21/21 2003 08/21/21 2353 08/22/21 0522 08/22/21 0524  BP: 115/76 132/79 125/88   Pulse: 78 88 88   Resp: 20 16 19    Temp: 98.6 F (37 C) 98.4 F (36.9 C) 98 F (36.7 C)   TempSrc:   Oral   SpO2: 96% 97% 98%   Weight:    107.5 kg    Intake/Output Summary (Last 24 hours) at 08/22/2021 0828 Last data filed at 08/22/2021 0728 Gross per 24 hour  Intake 489.3 ml  Output 1650 ml  Net -1160.7 ml   Last 3 Weights 08/22/2021 08/21/2021 08/15/2021  Weight (lbs) 236 lb 14.4 oz 238 lb 9.6 oz 240 lb 6.4 oz  Weight (kg) 107.457 kg 108.228 kg 109.045 kg      Telemetry  Overnight telemetry shows A. fib heart rates 70 to 90 bpm, which I personally reviewed.   Physical Exam   Vitals:   08/21/21 2003 08/21/21 2353 08/22/21 0522 08/22/21 0524  BP: 115/76 132/79 125/88   Pulse: 78 88 88   Resp: 20 16 19    Temp: 98.6 F (37 C) 98.4 F (36.9 C) 98 F (36.7 C)   TempSrc:   Oral   SpO2: 96% 97% 98%   Weight:    107.5 kg     Intake/Output Summary (Last 24 hours) at 08/22/2021 0828 Last data filed at 08/22/2021 0728 Gross per 24 hour  Intake 489.3 ml  Output 1650 ml  Net -1160.7 ml    Last 3 Weights 08/22/2021 08/21/2021 08/15/2021  Weight (lbs) 236 lb 14.4 oz 238 lb 9.6 oz 240 lb 6.4 oz  Weight (kg) 107.457 kg 108.228 kg 109.045 kg    Body mass index is 39.42 kg/m.   General: Well nourished, well developed, in no acute distress Head: Atraumatic, normal size  Eyes: PEERLA, EOMI  Neck: Supple, no JVD Endocrine: No thryomegaly Cardiac: Normal S1, S2; irregular rhythm, no murmurs Lungs: Clear to auscultation bilaterally, no wheezing, rhonchi or rales  Abd: Soft, nontender, no hepatomegaly  Ext: No edema, pulses 2+ Musculoskeletal: No deformities, BUE and BLE strength normal and equal Skin: Warm and dry, no rashes   Neuro: Alert and oriented to person, place, time, and situation, CNII-XII grossly intact, no focal deficits  Psych: Normal mood and affect   Labs  High Sensitivity Troponin:   Recent Labs  Lab 08/19/21 2134 08/20/21 0755  TROPONINIHS 6 6     Cardiac EnzymesNo results for input(s):  TROPONINI in the last 168 hours. No results for input(s): TROPIPOC in the last 168 hours.  Chemistry Recent Labs  Lab 08/19/21 2134 08/20/21 0755  NA 132* 132*  K 4.4 4.0  CL 101 99  CO2 23 22  GLUCOSE 99 110*  BUN 13 6*  CREATININE 0.95 0.76  CALCIUM 9.2 9.5  PROT 6.7  --   ALBUMIN 3.9  --   AST 29  --   ALT 40  --   ALKPHOS 63  --   BILITOT 0.9  --   GFRNONAA >60 >60  ANIONGAP 8 11    Hematology Recent Labs  Lab 08/19/21 2134 08/20/21 0755  WBC 7.6 7.6  RBC 3.59* 3.81*  HGB 12.1 12.7  HCT 36.1 38.2  MCV 100.6* 100.3*  MCH 33.7 33.3  MCHC 33.5 33.2  RDW 12.7 12.7  PLT 186 192   BNPNo results for input(s): BNP, PROBNP in the last 168 hours.  DDimer No results for input(s): DDIMER in the last 168 hours.   Radiology  No results found.  Cardiac Studies  TTE 12/09/2019  1. Left  ventricular ejection fraction, by estimation, is 55 to 60%. The  left ventricle has normal function. The left ventricle has no regional  wall motion abnormalities. There is mild concentric left ventricular  hypertrophy. Left ventricular diastolic  parameters were normal.   2. Right ventricular systolic function is normal. The right ventricular  size is normal. There is moderately elevated pulmonary artery systolic  pressure.   3. The mitral valve is normal in structure and function. Mild mitral  valve regurgitation. No evidence of mitral stenosis.   4. The aortic valve is tricuspid. Aortic valve regurgitation is not  visualized. No aortic stenosis is present.   5. The inferior vena cava is normal in size with <50% respiratory  variability, suggesting right atrial pressure of 8 mmHg.   Patient Profile  Amanda Wilkins is a 75 y.o. female with pAF, obesity, OSA who was admitted on 08/20/2021 with weakness and recurrence of atrial fibrillation.  Assessment & Plan   #Persistent atrial fibrillation -She reports she does not want to do cardioversion today.  We discussed going home on rate control strategy of flecainide.  She is agreeable to this.  Likely can be considered for outpatient cardioversion.  She is just nervous about the procedure. -I have changed her diltiazem to 240 mg extended release.  Continue metoprolol tartrate 25 twice daily.  Continue flecainide 50 mg twice daily. -She has remained on Eliquis without bleeding.  Seems to be doing well. -Echo last year showed normal LV function.  She also had a coronary calcium score of 0 in 2020.  Given her age I think she is okay to maintain flecainide for now until she completes an outpatient stress test. -We will arrange hospital follow-up in A. fib clinic in 1 to 2 weeks  [facility] will sign off.   Medication Recommendations: Diltiazem XR to 40 daily, metoprolol tartrate 25 mg twice daily, flecainide 50 mg twice daily, Eliquis 5  mg twice daily Other recommendations (labs, testing, etc): None. Follow up as an outpatient: We will arrange outpatient follow-up in A. fib clinic.  For questions or updates, please contact [place] Please consult [email or web address] for contact info under   Time Spent with Patient: I have spent a total of 35 minutes with patient reviewing hospital notes, telemetry, EKGs, labs and examining the patient as well as establishing an assessment and plan that was discussed with  the patient.  > 50% of time was spent in direct patient care.    Signed, Addison Naegeli. Audie Box, MD, [place]  08/22/2021 8:28 AM

## 2021-08-22 NOTE — Progress Notes (Signed)
Pt requesting to speak with MD concerning her scheduled procedure. Dr. Audie Box paged and notified. Pt remains NPO as ordered, reported off to oncoming RN. P. Amo Octivia Canion. RN

## 2021-08-22 NOTE — Telephone Encounter (Signed)
Transition Care Management Follow-up Telephone Call Date of discharge and from where: 08/22/2021 from [facility] How have you been since you were released from the hospital? "Still in a-fib" Any questions or concerns? No  Items Reviewed: Did the pt receive and understand the discharge instructions provided? Yes  Medications obtained and verified? Yes  Other? No  Any new allergies since your discharge? No  Dietary orders reviewed? No; regular diet Do you have support at home? Yes   Home Care and Equipment/Supplies: Were home health services ordered? no If so, what is the name of the agency? no  Has the agency set up a time to come to the patient's home? not applicable Were any new equipment or medical supplies ordered?  No What is the name of the medical supply agency? no Were you able to get the supplies/equipment? not applicable Do you have any questions related to the use of the equipment or supplies? No  Functional Questionnaire: (I = Independent and D = Dependent) ADLs: I  Bathing/Dressing- I  Meal Prep- I  Eating- I  Maintaining continence- I  Transferring/Ambulation- I  Managing Meds- I  Follow up appointments reviewed:  PCP Hospital f/u appt confirmed? Yes  Scheduled to see [name], MD on 08/28/2021 @ 3:40 pm. [facility] f/u appt confirmed? Yes  Scheduled to see A-Fib clinic  Are transportation arrangements needed? No  If their condition worsens, is the pt aware to call PCP or go to the Emergency Dept.? Yes Was the patient provided with contact information for the PCP's office or ED? Yes Was to pt encouraged to call back with questions or concerns? Yes

## 2021-08-22 NOTE — H&P (View-Only) (Signed)
Cardiology Progress Note  Patient ID: Amanda Wilkins MRN: [id number] DOB: 05-23-1946 Date of Encounter: 08/22/2021  Primary Cardiologist: [name], MD  Subjective   Chief Complaint: Anxiety about DCCV  HPI: Heart rates much improved 80-100 bpm.  She has not wanted to do cardioversion.  We discussed discharge with rate control strategy on flecainide and follow-up in A. fib clinic.  She agrees with this.  ROS:  All other ROS reviewed and negative. Pertinent positives noted in the HPI.     Inpatient Medications  Scheduled Meds:  apixaban  5 mg Oral BID   diltiazem  240 mg Oral Daily   flecainide  50 mg Oral BID   hydroxychloroquine  200 mg Oral q morning   influenza vaccine adjuvanted  0.5 mL Intramuscular Tomorrow-1000   metoprolol tartrate  25 mg Oral BID   rosuvastatin  5 mg Oral Daily   sertraline  50 mg Oral Daily   Continuous Infusions:  PRN Meds: acetaminophen, ALPRAZolam, EPINEPHrine, ondansetron (ZOFRAN) IV   Vital Signs   Vitals:   08/21/21 2003 08/21/21 2353 08/22/21 0522 08/22/21 0524  BP: 115/76 132/79 125/88   Pulse: 78 88 88   Resp: 20 16 19    Temp: 98.6 F (37 C) 98.4 F (36.9 C) 98 F (36.7 C)   TempSrc:   Oral   SpO2: 96% 97% 98%   Weight:    107.5 kg    Intake/Output Summary (Last 24 hours) at 08/22/2021 0828 Last data filed at 08/22/2021 0728 Gross per 24 hour  Intake 489.3 ml  Output 1650 ml  Net -1160.7 ml   Last 3 Weights 08/22/2021 08/21/2021 08/15/2021  Weight (lbs) 236 lb 14.4 oz 238 lb 9.6 oz 240 lb 6.4 oz  Weight (kg) 107.457 kg 108.228 kg 109.045 kg      Telemetry  Overnight telemetry shows A. fib heart rates 70 to 90 bpm, which I personally reviewed.   Physical Exam   Vitals:   08/21/21 2003 08/21/21 2353 08/22/21 0522 08/22/21 0524  BP: 115/76 132/79 125/88   Pulse: 78 88 88   Resp: 20 16 19    Temp: 98.6 F (37 C) 98.4 F (36.9 C) 98 F (36.7 C)   TempSrc:   Oral   SpO2: 96% 97% 98%   Weight:    107.5 kg     Intake/Output Summary (Last 24 hours) at 08/22/2021 0828 Last data filed at 08/22/2021 0728 Gross per 24 hour  Intake 489.3 ml  Output 1650 ml  Net -1160.7 ml    Last 3 Weights 08/22/2021 08/21/2021 08/15/2021  Weight (lbs) 236 lb 14.4 oz 238 lb 9.6 oz 240 lb 6.4 oz  Weight (kg) 107.457 kg 108.228 kg 109.045 kg    Body mass index is 39.42 kg/m.   General: Well nourished, well developed, in no acute distress Head: Atraumatic, normal size  Eyes: PEERLA, EOMI  Neck: Supple, no JVD Endocrine: No thryomegaly Cardiac: Normal S1, S2; irregular rhythm, no murmurs Lungs: Clear to auscultation bilaterally, no wheezing, rhonchi or rales  Abd: Soft, nontender, no hepatomegaly  Ext: No edema, pulses 2+ Musculoskeletal: No deformities, BUE and BLE strength normal and equal Skin: Warm and dry, no rashes   Neuro: Alert and oriented to person, place, time, and situation, CNII-XII grossly intact, no focal deficits  Psych: Normal mood and affect   Labs  High Sensitivity Troponin:   Recent Labs  Lab 08/19/21 2134 08/20/21 0755  TROPONINIHS 6 6     Cardiac EnzymesNo results for input(s):  TROPONINI in the last 168 hours. No results for input(s): TROPIPOC in the last 168 hours.  Chemistry Recent Labs  Lab 08/19/21 2134 08/20/21 0755  NA 132* 132*  K 4.4 4.0  CL 101 99  CO2 23 22  GLUCOSE 99 110*  BUN 13 6*  CREATININE 0.95 0.76  CALCIUM 9.2 9.5  PROT 6.7  --   ALBUMIN 3.9  --   AST 29  --   ALT 40  --   ALKPHOS 63  --   BILITOT 0.9  --   GFRNONAA >60 >60  ANIONGAP 8 11    Hematology Recent Labs  Lab 08/19/21 2134 08/20/21 0755  WBC 7.6 7.6  RBC 3.59* 3.81*  HGB 12.1 12.7  HCT 36.1 38.2  MCV 100.6* 100.3*  MCH 33.7 33.3  MCHC 33.5 33.2  RDW 12.7 12.7  PLT 186 192   BNPNo results for input(s): BNP, PROBNP in the last 168 hours.  DDimer No results for input(s): DDIMER in the last 168 hours.   Radiology  No results found.  Cardiac Studies  TTE 12/09/2019  1. Left  ventricular ejection fraction, by estimation, is 55 to 60%. The  left ventricle has normal function. The left ventricle has no regional  wall motion abnormalities. There is mild concentric left ventricular  hypertrophy. Left ventricular diastolic  parameters were normal.   2. Right ventricular systolic function is normal. The right ventricular  size is normal. There is moderately elevated pulmonary artery systolic  pressure.   3. The mitral valve is normal in structure and function. Mild mitral  valve regurgitation. No evidence of mitral stenosis.   4. The aortic valve is tricuspid. Aortic valve regurgitation is not  visualized. No aortic stenosis is present.   5. The inferior vena cava is normal in size with <50% respiratory  variability, suggesting right atrial pressure of 8 mmHg.   Patient Profile  Amanda Wilkins is a 75 y.o. female with pAF, obesity, OSA who was admitted on 08/20/2021 with weakness and recurrence of atrial fibrillation.  Assessment & Plan   #Persistent atrial fibrillation -She reports she does not want to do cardioversion today.  We discussed going home on rate control strategy of flecainide.  She is agreeable to this.  Likely can be considered for outpatient cardioversion.  She is just nervous about the procedure. -I have changed her diltiazem to 240 mg extended release.  Continue metoprolol tartrate 25 twice daily.  Continue flecainide 50 mg twice daily. -She has remained on Eliquis without bleeding.  Seems to be doing well. -Echo last year showed normal LV function.  She also had a coronary calcium score of 0 in 2020.  Given her age I think she is okay to maintain flecainide for now until she completes an outpatient stress test. -We will arrange hospital follow-up in A. fib clinic in 1 to 2 weeks  [facility] will sign off.   Medication Recommendations: Diltiazem XR to 40 daily, metoprolol tartrate 25 mg twice daily, flecainide 50 mg twice daily, Eliquis 5  mg twice daily Other recommendations (labs, testing, etc): None. Follow up as an outpatient: We will arrange outpatient follow-up in A. fib clinic.  For questions or updates, please contact [place] Please consult [email or web address] for contact info under   Time Spent with Patient: I have spent a total of 35 minutes with patient reviewing hospital notes, telemetry, EKGs, labs and examining the patient as well as establishing an assessment and plan that was discussed with  the patient.  > 50% of time was spent in direct patient care.    Signed, Addison Naegeli. Audie Box, MD, [place]  08/22/2021 8:28 AM

## 2021-08-22 NOTE — Progress Notes (Signed)
Mobility Specialist Progress Note:   08/22/21 1005  Mobility  Activity Ambulated in room  Level of Assistance Standby assist, set-up cues, supervision of patient - no hands on  Assistive Device None  Distance Ambulated (ft) 80 ft  Mobility Ambulated with assistance in room  Mobility Response Tolerated well  Mobility performed by Mobility specialist  $Mobility charge 1 Mobility   Pre- Mobility: 89 HR During Mobility: 130 HR Post Mobility: 98 HR  Pt received in bed willing to participate in mobility. Limited to room ambulation d/t increased HR. Pt returned to bed with call bell in reach and all needs met.  [facility] Health and safety inspector Phone [phone]

## 2021-08-22 NOTE — Progress Notes (Signed)
Patient d/c home. IV removed. Discharge instructions explained and given to patient.

## 2021-08-22 NOTE — Anesthesia Preprocedure Evaluation (Deleted)
Anesthesia Evaluation    Reviewed: Allergy & Precautions, Patient's Chart, lab work & pertinent test results, reviewed documented beta blocker date and time   Airway        Dental   Pulmonary asthma , sleep apnea and Continuous Positive Airway Pressure Ventilation ,           Cardiovascular hypertension, Pt. on medications and Pt. on home beta blockers + dysrhythmias (eliquis) Atrial Fibrillation and Supra Ventricular Tachycardia      Neuro/Psych  Headaches, PSYCHIATRIC DISORDERS Depression    GI/Hepatic negative GI ROS, Neg liver ROS,   Endo/Other  Morbid obesityBMI 39  Renal/GU Renal disease (interstitial nephritis, Cr 0.76)  negative genitourinary   Musculoskeletal  (+) Arthritis , Osteoarthritis,    Abdominal   Peds  Hematology negative hematology ROS (+)   Anesthesia Other Findings   Reproductive/Obstetrics negative OB ROS                             Anesthesia Physical Anesthesia Plan  ASA: 3  Anesthesia Plan: General   Post-op Pain Management:    Induction: Intravenous  PONV Risk Score and Plan: TIVA and Treatment may vary due to age or medical condition  Airway Management Planned: Natural Airway and Mask  Additional Equipment: None  Intra-op Plan:   Post-operative Plan:   Informed Consent:   Plan Discussed with:   Anesthesia Plan Comments:         Anesthesia Quick Evaluation

## 2021-08-23 ENCOUNTER — Telehealth: Payer: Self-pay | Admitting: Physician Assistant

## 2021-08-23 ENCOUNTER — Telehealth: Payer: Self-pay | Admitting: Internal Medicine

## 2021-08-23 NOTE — Telephone Encounter (Signed)
Thx

## 2021-08-23 NOTE — Telephone Encounter (Signed)
Patient called after our answering service due to low blood pressure with systolic blood pressure in the high 80s.  She denies any dizziness or feeling of passing out.  She was just released from the hospital yesterday and the plan to undergo outpatient cardioversion next Monday.  I instructed the patient to keep herself hydrated.  Hold tonight's dose of metoprolol to tartrate.  Continue on long-acting diltiazem and resume her normal regimen tomorrow morning.  Her last dose of long-acting diltiazem and metoprolol was this morning.  She is also on flecainide and Eliquis.  If blood pressure becomes low again tomorrow, she has been advised to contact cardiology service.  She is aware to go to the emergency room if she started having worsening dizziness or feeling of passing out

## 2021-08-23 NOTE — Telephone Encounter (Signed)
Spoke to pt last night   She's at home    Anxious about cardioversion   Reassured her    I would recomm proceeding  She agrees  Says she is still fatigued I am Reader [facility] on Monday and can do   I have placed on sched for 12:30  Spoke to endo    Please call patient re instructoins

## 2021-08-23 NOTE — Telephone Encounter (Signed)
Spoke with the pt and she verbalized understanding of her Cardioversion instructions for 08/28/21.

## 2021-08-23 NOTE — Telephone Encounter (Signed)
Opened in error to do TCM, but it has already been done

## 2021-08-24 NOTE — Telephone Encounter (Signed)
I called the pt back and she says she is feeling well and her BP readings today were: 122/89 HR 90 118/85 HR 90 108/74 HR 100  She will go ahead and take her Diltiazem and be sure to hydrate well and will call if she has any further problems.

## 2021-08-24 NOTE — Telephone Encounter (Signed)
Spoke with the pt and she says that her BP dropped last night and she felt light headed to 85/71... she says it has improved as the might went on... I advised he that with her Afib it may not be a "normal" reading and to be sure that she is hydrating well but will hold her Diltiazem for now but she will monitor her BP this morning and I will call her back in a few hours to see what her BP is doing.

## 2021-08-24 NOTE — Telephone Encounter (Signed)
Patient calling back requesting a nurse call her to discuss the BP and medication changes.

## 2021-08-25 ENCOUNTER — Telehealth: Payer: Self-pay | Admitting: Internal Medicine

## 2021-08-25 NOTE — Telephone Encounter (Signed)
Spoke with pt and is lightheaded .Pt is not tolerating Metoprolol and Diltiazem B/P reading is from after only taking the Metoprolol and pt is worried about having to take the Diltiazem Will forward to Dr [name] for review and recommendations ./cy

## 2021-08-25 NOTE — Telephone Encounter (Signed)
Pt c/o BP issue: STAT if pt c/o blurred vision, one-sided weakness or slurred speech  1. What are your last 5 BP readings?  08/25/21: 85/67 HR 62  2. Are you having any other symptoms (ex. Dizziness, headache, blurred vision, passed out)? Lightheaded   3. What is your BP issue? Patient is  due to take her diltiazem (CARDIZEM CD) 240 MG 24 hr capsule at 1:00 today but she is concerned about taking her meds when her BP is already low  She took her metoprolol this morning at 9:00 am with all of her regular meds. She was advised to take her diltiazem (CARDIZEM CD) 240 MG 24 hr capsule 4 hours after taking her metoprolol tartrate (LOPRESSOR) 25 MG tablet

## 2021-08-25 NOTE — Telephone Encounter (Signed)
Called patient   With apple watch still in afib   Rates 100s REcomm hold diltiazem    Take metoprolol 25 bid   Can increase to tid      Keep following BP and HR

## 2021-08-28 ENCOUNTER — Inpatient Hospital Stay: Payer: Medicare Other | Admitting: Internal Medicine

## 2021-08-28 ENCOUNTER — Other Ambulatory Visit: Payer: Self-pay

## 2021-08-28 ENCOUNTER — Ambulatory Visit (HOSPITAL_COMMUNITY): Payer: Medicare Other | Admitting: Anesthesiology

## 2021-08-28 ENCOUNTER — Telehealth: Payer: Self-pay | Admitting: Internal Medicine

## 2021-08-28 ENCOUNTER — Encounter (HOSPITAL_COMMUNITY)
Admission: RE | Disposition: A | Discharge status: Home / Self Care | Payer: Self-pay | Source: Ambulatory Visit | Attending: Internal Medicine

## 2021-08-28 ENCOUNTER — Ambulatory Visit (HOSPITAL_COMMUNITY)
Admission: RE | Admit: 2021-08-28 | Discharge: 2021-08-28 | Disposition: A | Discharge status: Home / Self Care | Payer: Medicare Other | Source: Ambulatory Visit | Attending: Internal Medicine | Admitting: Internal Medicine

## 2021-08-28 ENCOUNTER — Encounter (HOSPITAL_COMMUNITY): Payer: Self-pay | Admitting: Internal Medicine

## 2021-08-28 DIAGNOSIS — I4819 Other persistent atrial fibrillation: Secondary | ICD-10-CM

## 2021-08-28 DIAGNOSIS — Z79899 Other long term (current) drug therapy: Secondary | ICD-10-CM | POA: Diagnosis not present

## 2021-08-28 DIAGNOSIS — I440 Atrioventricular block, first degree: Secondary | ICD-10-CM | POA: Insufficient documentation

## 2021-08-28 DIAGNOSIS — I4891 Unspecified atrial fibrillation: Secondary | ICD-10-CM

## 2021-08-28 DIAGNOSIS — I480 Paroxysmal atrial fibrillation: Secondary | ICD-10-CM

## 2021-08-28 DIAGNOSIS — Z7901 Long term (current) use of anticoagulants: Secondary | ICD-10-CM | POA: Insufficient documentation

## 2021-08-28 DIAGNOSIS — Z9989 Dependence on other enabling machines and devices: Secondary | ICD-10-CM | POA: Diagnosis not present

## 2021-08-28 DIAGNOSIS — I10 Essential (primary) hypertension: Secondary | ICD-10-CM | POA: Diagnosis not present

## 2021-08-28 DIAGNOSIS — E871 Hypo-osmolality and hyponatremia: Secondary | ICD-10-CM | POA: Diagnosis not present

## 2021-08-28 DIAGNOSIS — G4733 Obstructive sleep apnea (adult) (pediatric): Secondary | ICD-10-CM | POA: Diagnosis not present

## 2021-08-28 HISTORY — PX: CARDIOVERSION: SHX1299

## 2021-08-28 SURGERY — CARDIOVERSION
Anesthesia: General

## 2021-08-28 MED ORDER — FLECAINIDE ACETATE 50 MG PO TABS: 75.0000 mg | ORAL_TABLET | Freq: Two times a day (BID) | ORAL | 3 refills | Status: DC

## 2021-08-28 MED ORDER — PROPOFOL 10 MG/ML IV BOLUS
INTRAVENOUS | Status: DC | PRN
  Administered 2021-08-28: 50 mg via INTRAVENOUS
  Administered 2021-08-28: 80 mg via INTRAVENOUS

## 2021-08-28 MED ORDER — LIDOCAINE HCL (CARDIAC) PF 100 MG/5ML IV SOSY
PREFILLED_SYRINGE | INTRAVENOUS | Status: DC | PRN
  Administered 2021-08-28: 50 mg via INTRAVENOUS

## 2021-08-28 MED ORDER — SODIUM CHLORIDE 0.9 % IV SOLN: INTRAVENOUS | Status: DC

## 2021-08-28 NOTE — Discharge Instructions (Signed)

## 2021-08-28 NOTE — Addendum Note (Signed)
Addended by: Stephani Police on: 08/28/2021 01:51 PM   Modules accepted: Orders

## 2021-08-28 NOTE — Telephone Encounter (Signed)
Patient s/p cardioversion REverted to afib while here  REviewed with EP  Increase flecanide to 75 bid   Will need Rx called in   Come in for trough level in 2 wks      Cardioversion tentatively after

## 2021-08-28 NOTE — Progress Notes (Signed)
1303 patient converted back to atrial fibrillation. Dr [name] present

## 2021-08-28 NOTE — Telephone Encounter (Addendum)
Need to call the pt but she waiting for her to come home and after she recovers from sedation from Cardioversion this A.M.   Flecainide sent to her pharmacy.  Flecainide trough level ordered.   Spoke with the pt and she will have her trough level 09/11/21.

## 2021-08-28 NOTE — Transfer of Care (Signed)
Immediate Anesthesia Transfer of Care Note  Patient: Amanda Wilkins  Procedure(s) Performed: CARDIOVERSION  Patient Location: Endoscopy Unit  Anesthesia Type:General  Level of Consciousness: awake, oriented and patient cooperative  Airway & Oxygen Therapy: Patient Spontanous Breathing and Patient connected to nasal cannula oxygen  Post-op Assessment: Report given to RN and Post -op Vital signs reviewed and stable  Post vital signs: Reviewed  Last Vitals:  Vitals Value Taken Time  BP 119/66 08/28/21 1250  Temp 36.1 C 08/28/21 1250  Pulse 66 08/28/21 1257  Resp 17 08/28/21 1257  SpO2 100 % 08/28/21 1257  Vitals shown include unvalidated device data.  Last Pain:  Vitals:   08/28/21 1250  TempSrc: Temporal  PainSc: 0-No pain         Complications: No notable events documented.

## 2021-08-28 NOTE — Anesthesia Procedure Notes (Signed)
Procedure Name: General with mask airway Date/Time: 08/28/2021 12:40 PM Performed by: Jenne Campus, CRNA Pre-anesthesia Checklist: Patient identified, Emergency Drugs available, Suction available and Patient being monitored Patient Re-evaluated:Patient Re-evaluated prior to induction Oxygen Delivery Method: Ambu bag

## 2021-08-28 NOTE — Anesthesia Preprocedure Evaluation (Addendum)
Anesthesia Evaluation  Patient identified by MRN, date of birth, ID band Patient awake    Reviewed: Allergy & Precautions, H&P , NPO status , Patient's Chart, lab work & pertinent test results  Airway Mallampati: II   Neck ROM: full    Dental  (+) Dental Advisory Given   Pulmonary shortness of breath, sleep apnea ,    breath sounds clear to auscultation       Cardiovascular hypertension, + dysrhythmias Atrial Fibrillation  Rhythm:irregular Rate:Normal     Neuro/Psych  Headaches, PSYCHIATRIC DISORDERS Depression  Neuromuscular disease    GI/Hepatic   Endo/Other    Renal/GU      Musculoskeletal  (+) Arthritis ,   Abdominal   Peds  Hematology   Anesthesia Other Findings   Reproductive/Obstetrics                            Anesthesia Physical Anesthesia Plan  ASA: 3  Anesthesia Plan: General   Post-op Pain Management:    Induction: Intravenous  PONV Risk Score and Plan: 3 and Propofol infusion and Treatment may vary due to age or medical condition  Airway Management Planned: Nasal Cannula  Additional Equipment:   Intra-op Plan:   Post-operative Plan:   Informed Consent: I have reviewed the patients History and Physical, chart, labs and discussed the procedure including the risks, benefits and alternatives for the proposed anesthesia with the patient or authorized representative who has indicated his/her understanding and acceptance.     Dental advisory given  Plan Discussed with: CRNA and Anesthesiologist  Anesthesia Plan Comments:         Anesthesia Quick Evaluation

## 2021-08-28 NOTE — Interval H&P Note (Signed)
History and Physical Interval Note:  08/28/2021 12:05 PM  Amanda Wilkins  has presented today for surgery, with the diagnosis of afib.  The various methods of treatment have been discussed with the patient and family. After consideration of risks, benefits and other options for treatment, the patient has consented to  Procedure(s): CARDIOVERSION (N/A) as a surgical intervention.  The patient's history has been reviewed, patient examined, no change in status, stable for surgery.  I have reviewed the patient's chart and labs.  Questions were answered to the patient's satisfaction.     [name]

## 2021-08-28 NOTE — CV Procedure (Signed)
CARDIOVERSION  Indication:   Atrial fibrillation  Patient sedated by anesthesia with lidocaine and propofol intravenously     WIth pads in AP position, cardioversion attempted with 200 J synchronized biphasic energy   Not successfult  Repeat with 200 J synchronized biphasic energy with applied pressure to decrease impedence   Converted to SR   12 lead EKG pending     Procedure was without complication  [name] MD

## 2021-08-29 ENCOUNTER — Encounter (HOSPITAL_COMMUNITY): Payer: Medicare Other | Admitting: Nurse Practitioner

## 2021-08-29 ENCOUNTER — Encounter (HOSPITAL_COMMUNITY): Payer: Medicare Other | Admitting: Physician Assistant

## 2021-08-29 ENCOUNTER — Encounter (HOSPITAL_COMMUNITY): Payer: Self-pay | Admitting: Internal Medicine

## 2021-08-29 NOTE — Anesthesia Postprocedure Evaluation (Signed)
Anesthesia Post Note  Patient: Amanda Wilkins  Procedure(s) Performed: CARDIOVERSION     Patient location during evaluation: Endoscopy Anesthesia Type: General Level of consciousness: awake and alert Pain management: pain level controlled Vital Signs Assessment: post-procedure vital signs reviewed and stable Respiratory status: spontaneous breathing, nonlabored ventilation, respiratory function stable and patient connected to nasal cannula oxygen Cardiovascular status: blood pressure returned to baseline and stable Postop Assessment: no apparent nausea or vomiting Anesthetic complications: no   No notable events documented.  Last Vitals:  Vitals:   08/28/21 1303 08/28/21 1310  BP: (!) 157/105 (!) 141/69  Pulse: 70 (!) 51  Resp: 15 10  Temp:    SpO2: 100% 100%    Last Pain:  Vitals:   08/28/21 1310  TempSrc:   PainSc: 0-No pain                 Stevee Valenta S

## 2021-09-01 ENCOUNTER — Emergency Department (HOSPITAL_BASED_OUTPATIENT_CLINIC_OR_DEPARTMENT_OTHER): Payer: Medicare Other

## 2021-09-01 ENCOUNTER — Other Ambulatory Visit: Payer: Self-pay

## 2021-09-01 ENCOUNTER — Ambulatory Visit: Payer: Medicare Other | Admitting: Nurse Practitioner

## 2021-09-01 ENCOUNTER — Inpatient Hospital Stay (HOSPITAL_BASED_OUTPATIENT_CLINIC_OR_DEPARTMENT_OTHER)
Admission: EM | Admit: 2021-09-01 | Discharge: 2021-09-03 | DRG: 445 | Disposition: A | Discharge status: Home / Self Care | Payer: Medicare Other | Attending: Family Medicine | Admitting: Family Medicine

## 2021-09-01 ENCOUNTER — Encounter (HOSPITAL_BASED_OUTPATIENT_CLINIC_OR_DEPARTMENT_OTHER): Payer: Self-pay | Admitting: Emergency Medicine

## 2021-09-01 DIAGNOSIS — K810 Acute cholecystitis: Secondary | ICD-10-CM | POA: Diagnosis not present

## 2021-09-01 DIAGNOSIS — E785 Hyperlipidemia, unspecified: Secondary | ICD-10-CM

## 2021-09-01 DIAGNOSIS — R110 Nausea: Secondary | ICD-10-CM | POA: Diagnosis not present

## 2021-09-01 DIAGNOSIS — R1011 Right upper quadrant pain: Secondary | ICD-10-CM

## 2021-09-01 DIAGNOSIS — R109 Unspecified abdominal pain: Secondary | ICD-10-CM | POA: Diagnosis not present

## 2021-09-01 DIAGNOSIS — M069 Rheumatoid arthritis, unspecified: Secondary | ICD-10-CM | POA: Diagnosis present

## 2021-09-01 DIAGNOSIS — Z91048 Other nonmedicinal substance allergy status: Secondary | ICD-10-CM

## 2021-09-01 DIAGNOSIS — R1013 Epigastric pain: Secondary | ICD-10-CM | POA: Diagnosis not present

## 2021-09-01 DIAGNOSIS — G4733 Obstructive sleep apnea (adult) (pediatric): Secondary | ICD-10-CM | POA: Diagnosis present

## 2021-09-01 DIAGNOSIS — I480 Paroxysmal atrial fibrillation: Secondary | ICD-10-CM | POA: Diagnosis present

## 2021-09-01 DIAGNOSIS — I4892 Unspecified atrial flutter: Secondary | ICD-10-CM | POA: Diagnosis present

## 2021-09-01 DIAGNOSIS — Z20822 Contact with and (suspected) exposure to covid-19: Secondary | ICD-10-CM | POA: Diagnosis present

## 2021-09-01 DIAGNOSIS — J45909 Unspecified asthma, uncomplicated: Secondary | ICD-10-CM | POA: Diagnosis present

## 2021-09-01 DIAGNOSIS — Z888 Allergy status to other drugs, medicaments and biological substances status: Secondary | ICD-10-CM

## 2021-09-01 DIAGNOSIS — I10 Essential (primary) hypertension: Secondary | ICD-10-CM | POA: Diagnosis present

## 2021-09-01 DIAGNOSIS — Z79899 Other long term (current) drug therapy: Secondary | ICD-10-CM | POA: Diagnosis not present

## 2021-09-01 DIAGNOSIS — F4323 Adjustment disorder with mixed anxiety and depressed mood: Secondary | ICD-10-CM | POA: Diagnosis present

## 2021-09-01 DIAGNOSIS — N200 Calculus of kidney: Secondary | ICD-10-CM

## 2021-09-01 DIAGNOSIS — D638 Anemia in other chronic diseases classified elsewhere: Secondary | ICD-10-CM | POA: Diagnosis present

## 2021-09-01 DIAGNOSIS — Z886 Allergy status to analgesic agent status: Secondary | ICD-10-CM | POA: Diagnosis not present

## 2021-09-01 DIAGNOSIS — Z96651 Presence of right artificial knee joint: Secondary | ICD-10-CM | POA: Diagnosis present

## 2021-09-01 DIAGNOSIS — K8000 Calculus of gallbladder with acute cholecystitis without obstruction: Secondary | ICD-10-CM | POA: Diagnosis present

## 2021-09-01 DIAGNOSIS — Z880 Allergy status to penicillin: Secondary | ICD-10-CM

## 2021-09-01 DIAGNOSIS — G43909 Migraine, unspecified, not intractable, without status migrainosus: Secondary | ICD-10-CM | POA: Diagnosis present

## 2021-09-01 DIAGNOSIS — K8020 Calculus of gallbladder without cholecystitis without obstruction: Secondary | ICD-10-CM | POA: Diagnosis not present

## 2021-09-01 DIAGNOSIS — Z7901 Long term (current) use of anticoagulants: Secondary | ICD-10-CM

## 2021-09-01 DIAGNOSIS — Z01810 Encounter for preprocedural cardiovascular examination: Secondary | ICD-10-CM | POA: Diagnosis not present

## 2021-09-01 DIAGNOSIS — R7989 Other specified abnormal findings of blood chemistry: Secondary | ICD-10-CM

## 2021-09-01 DIAGNOSIS — I2510 Atherosclerotic heart disease of native coronary artery without angina pectoris: Secondary | ICD-10-CM | POA: Diagnosis present

## 2021-09-01 DIAGNOSIS — Z91013 Allergy to seafood: Secondary | ICD-10-CM

## 2021-09-01 DIAGNOSIS — K819 Cholecystitis, unspecified: Secondary | ICD-10-CM | POA: Diagnosis present

## 2021-09-01 DIAGNOSIS — D649 Anemia, unspecified: Secondary | ICD-10-CM | POA: Diagnosis present

## 2021-09-01 LAB — RESP PANEL BY RT-PCR (FLU A&B, COVID) ARPGX2
Influenza A by PCR: NEGATIVE
Influenza B by PCR: NEGATIVE
SARS Coronavirus 2 by RT PCR: NEGATIVE

## 2021-09-01 LAB — URINALYSIS, ROUTINE W REFLEX MICROSCOPIC
Bilirubin Urine: NEGATIVE
Glucose, UA: NEGATIVE mg/dL
Hgb urine dipstick: NEGATIVE
Ketones, ur: NEGATIVE mg/dL
Leukocytes,Ua: NEGATIVE
Nitrite: NEGATIVE
Protein, ur: NEGATIVE mg/dL
Specific Gravity, Urine: 1.021 (ref 1.005–1.030)
pH: 6.5 (ref 5.0–8.0)

## 2021-09-01 LAB — PROTIME-INR
INR: 1 (ref 0.8–1.2)
Prothrombin Time: 13.1 seconds (ref 11.4–15.2)

## 2021-09-01 LAB — COMPREHENSIVE METABOLIC PANEL
ALT: 192 U/L — ABNORMAL HIGH (ref 0–44)
AST: 105 U/L — ABNORMAL HIGH (ref 15–41)
Albumin: 4.6 g/dL (ref 3.5–5.0)
Alkaline Phosphatase: 100 U/L (ref 38–126)
Anion gap: 12 (ref 5–15)
BUN: 13 mg/dL (ref 8–23)
CO2: 22 mmol/L (ref 22–32)
Calcium: 9.8 mg/dL (ref 8.9–10.3)
Chloride: 101 mmol/L (ref 98–111)
Creatinine, Ser: 0.77 mg/dL (ref 0.44–1.00)
GFR, Estimated: >60 mL/min (ref >60)
Glucose, Bld: 101 mg/dL — ABNORMAL HIGH (ref 70–99)
Potassium: 4.5 mmol/L (ref 3.5–5.1)
Sodium: 135 mmol/L (ref 135–145)
Total Bilirubin: 0.6 mg/dL (ref 0.3–1.2)
Total Protein: 7.4 g/dL (ref 6.5–8.1)

## 2021-09-01 LAB — TROPONIN I (HIGH SENSITIVITY)
Troponin I (High Sensitivity): 4 ng/L (ref <18)
Troponin I (High Sensitivity): 4 ng/L (ref <18)

## 2021-09-01 LAB — LIPASE, BLOOD: Lipase: 41 U/L (ref 11–51)

## 2021-09-01 LAB — CBC WITH DIFFERENTIAL/PLATELET
Abs Immature Granulocytes: 0.01 10*3/uL (ref 0.00–0.07)
Basophils Absolute: 0.1 10*3/uL (ref 0.0–0.1)
Basophils Relative: 1 %
Eosinophils Absolute: 0.1 10*3/uL (ref 0.0–0.5)
Eosinophils Relative: 2 %
HCT: 35 % — ABNORMAL LOW (ref 36.0–46.0)
Hemoglobin: 11.7 g/dL — ABNORMAL LOW (ref 12.0–15.0)
Immature Granulocytes: 0 %
Lymphocytes Relative: 21 %
Lymphs Abs: 1.1 10*3/uL (ref 0.7–4.0)
MCH: 33.1 pg (ref 26.0–34.0)
MCHC: 33.4 g/dL (ref 30.0–36.0)
MCV: 98.9 fL (ref 80.0–100.0)
Monocytes Absolute: 0.3 10*3/uL (ref 0.1–1.0)
Monocytes Relative: 6 %
Neutro Abs: 3.8 10*3/uL (ref 1.7–7.7)
Neutrophils Relative %: 70 %
Platelets: 171 10*3/uL (ref 150–400)
RBC: 3.54 MIL/uL — ABNORMAL LOW (ref 3.87–5.11)
RDW: 12.6 % (ref 11.5–15.5)
WBC: 5.4 10*3/uL (ref 4.0–10.5)
nRBC: 0 % (ref 0.0–0.2)

## 2021-09-01 LAB — HEPARIN LEVEL (UNFRACTIONATED): Heparin Unfractionated: >1.1 IU/mL — ABNORMAL HIGH (ref 0.30–0.70)

## 2021-09-01 LAB — APTT: aPTT: 33 seconds (ref 24–36)

## 2021-09-01 MED ORDER — ALPRAZOLAM 0.25 MG PO TABS
0.2500 mg | ORAL_TABLET | Freq: Two times a day (BID) | ORAL | Status: DC | PRN
  Administered 2021-09-01: 0.25 mg via ORAL
  Filled 2021-09-01: qty 1

## 2021-09-01 MED ORDER — FLECAINIDE ACETATE 50 MG PO TABS
75.0000 mg | ORAL_TABLET | Freq: Two times a day (BID) | ORAL | Status: DC
  Administered 2021-09-01 – 2021-09-03 (×4): 75 mg via ORAL
  Filled 2021-09-01 (×5): qty 2

## 2021-09-01 MED ORDER — IOHEXOL 300 MG/ML  SOLN
100.0000 mL | Freq: Once | INTRAMUSCULAR | Status: AC | PRN
  Administered 2021-09-01: 100 mL via INTRAVENOUS

## 2021-09-01 MED ORDER — ALUM & MAG HYDROXIDE-SIMETH 200-200-20 MG/5ML PO SUSP
15.0000 mL | Freq: Once | ORAL | Status: AC
  Administered 2021-09-01: 15 mL via ORAL
  Filled 2021-09-01: qty 30

## 2021-09-01 MED ORDER — SERTRALINE HCL 50 MG PO TABS
50.0000 mg | ORAL_TABLET | Freq: Every day | ORAL | Status: DC
  Administered 2021-09-02 – 2021-09-03 (×2): 50 mg via ORAL
  Filled 2021-09-01 (×2): qty 1

## 2021-09-01 MED ORDER — ROSUVASTATIN CALCIUM 5 MG PO TABS: 5.0000 mg | ORAL_TABLET | Freq: Every day | ORAL | Status: DC

## 2021-09-01 MED ORDER — METRONIDAZOLE 500 MG/100ML IV SOLN
500.0000 mg | Freq: Once | INTRAVENOUS | Status: AC
  Administered 2021-09-01: 500 mg via INTRAVENOUS
  Filled 2021-09-01: qty 100

## 2021-09-01 MED ORDER — FAMOTIDINE IN NACL 20-0.9 MG/50ML-% IV SOLN
20.0000 mg | Freq: Once | INTRAVENOUS | Status: AC
  Administered 2021-09-01: 20 mg via INTRAVENOUS
  Filled 2021-09-01: qty 50

## 2021-09-01 MED ORDER — METRONIDAZOLE 500 MG PO TABS
500.0000 mg | ORAL_TABLET | Freq: Two times a day (BID) | ORAL | Status: DC
  Administered 2021-09-01 – 2021-09-02 (×2): 500 mg via ORAL
  Filled 2021-09-01 (×2): qty 1

## 2021-09-01 MED ORDER — HYDROXYCHLOROQUINE SULFATE 200 MG PO TABS
400.0000 mg | ORAL_TABLET | Freq: Every morning | ORAL | Status: DC
  Administered 2021-09-02 – 2021-09-03 (×2): 400 mg via ORAL
  Filled 2021-09-01 (×2): qty 2

## 2021-09-01 MED ORDER — SODIUM CHLORIDE 0.9 % IV SOLN
1.0000 g | Freq: Once | INTRAVENOUS | Status: AC
  Administered 2021-09-01: 1 g via INTRAVENOUS
  Filled 2021-09-01: qty 10

## 2021-09-01 MED ORDER — ONDANSETRON HCL 4 MG/2ML IJ SOLN: 4.0000 mg | Freq: Four times a day (QID) | INTRAMUSCULAR | Status: DC | PRN

## 2021-09-01 MED ORDER — HEPARIN (PORCINE) 25000 UT/250ML-% IV SOLN
1450.0000 [IU]/h | INTRAVENOUS | Status: DC
  Administered 2021-09-01: 1250 [IU]/h via INTRAVENOUS
  Administered 2021-09-02: 1450 [IU]/h via INTRAVENOUS
  Filled 2021-09-01 (×2): qty 250

## 2021-09-01 MED ORDER — OXYCODONE HCL 5 MG PO TABS: 5.0000 mg | ORAL_TABLET | ORAL | Status: DC | PRN

## 2021-09-01 MED ORDER — METOPROLOL TARTRATE 25 MG PO TABS
25.0000 mg | ORAL_TABLET | Freq: Two times a day (BID) | ORAL | Status: DC
  Administered 2021-09-01 – 2021-09-03 (×4): 25 mg via ORAL
  Filled 2021-09-01 (×4): qty 1

## 2021-09-01 MED ORDER — ONDANSETRON HCL 4 MG PO TABS: 4.0000 mg | ORAL_TABLET | Freq: Four times a day (QID) | ORAL | Status: DC | PRN

## 2021-09-01 MED ORDER — POLYETHYLENE GLYCOL 3350 17 G PO PACK: 17.0000 g | PACK | Freq: Every day | ORAL | Status: DC | PRN

## 2021-09-01 MED ORDER — SODIUM CHLORIDE 0.9 % IV SOLN
2.0000 g | INTRAVENOUS | Status: DC
  Filled 2021-09-01: qty 20

## 2021-09-01 NOTE — ED Notes (Signed)
[facility] at bedside 

## 2021-09-01 NOTE — Assessment & Plan Note (Addendum)
?  related to cholecystitis Normal bili and alk phos Follow acute hepatitis panel - negative Hold crestor Improving, follow outpatient - recommending holding crestor Blanch Stang few days prior to resumption

## 2021-09-01 NOTE — Assessment & Plan Note (Addendum)
2 days RUQ abdominal pain CT abdomen/pelvis with wall thickening in gallbladder and pericholecystic fluid, concerning for acute cholecystitis RUQ [place] concerning for acute cholecystitsis  Cardiology c/s, appreciate recs - "could proceed with cholecystectomy in approximately 4 weeks"  Surgery aware - ok with discharge on abx and plan for outpatient follow up to plan surgery as outpatient (per cards, in around 4 weeks)  D/c with augmentin - pcn allergy, but discussed with pt/pharmacy - looks like she had several days of zosyn in 2016 and amoxicillin in 2013.  She's tolerating augmentin here, will continue at discharge.

## 2021-09-01 NOTE — H&P (Signed)
History and Physical    Amanda Wilkins WNU:[id number] DOB: April 12, 1946 DOA: 09/01/2021  PCP: Tresa Garter, MD  Patient coming from: home  I have personally briefly reviewed patient's old medical records in [facility] Link  Chief Complaint: RUQ abdominal pain  HPI: Amanda Wilkins is Amanda Wilkins 75 y.o. female with medical history significant of atrial fib on eliquis, s/p recent cardioversion on 11/14, HTN, HLD, anxiety/depression, RA, OSA on cpap presenting with 2 days of RUQ abdominal pain.   She notes pain started about 2 days ago.  Noted in RUQ.  Worse with movement and laying flat.  Denies change with eating.  Notes decreased appetite.  Denies fevers, chills.  Notes nausea, no vomiting.  No dysuria.  Denies drinking, smoking.    ED Course: dx with cholecystitis, hospitalist to admit.  Surgery consulting, recommended cardiac clearance.   Review of Systems: As per HPI otherwise all other systems reviewed and are negative.   Past Medical History:  Diagnosis Date   Acute bronchitis 09/11/2016   11/17 refractory   Acute cystitis without hematuria 05/17/2015   Acute kidney injury [facility])    Labs today   Acute right ankle pain 09/09/2018   Anticoagulant long-term use    Xarelto   Axillary pain    Bronchitis    Cellulitis    Cholecystitis    Chronic interstitial nephritis    CONJUNCTIVITIS, ACUTE 10/18/2010   Qualifier: Diagnosis of  By: Posey Rea MD, Georgina Quint    D-dimer, elevated    Depression    Eye inflammation    History of interstitial nephritis 2016   chronic   History of nuclear stress test 12/16/2014   Intermediate risk nuclear study w/ medium size moderate severity reversible defect in the basal and mid inferolateral and inferior wall (per dr cardiology note , dr Dietrich Pates did not think this was consistent with ischemia)/  normal LV function and wall motion, ef 75%   History of septic shock 01/22/2015   in setting [facility] Strep Cellulits erysipelas/chest wall  induration with streptoccocus basterium-- Severe sepsis, DIC, Acute respiratory failure with pulmonary edema, Acute Kidney failure with chronic interstitial nephritis   Hypertension    Hyponatremia    Migraines    on Zoloft for migraines   Mild carotid artery disease (HCC)    per duplex 08-30-2017 bilateral ICA 1-39%   OA (osteoarthritis) rheumotologist-  dr Zenovia Jordan   both knees,  shoulders, ankles   OSA on CPAP     moderate obstructive sleep apnea with an AHI of 23.4/h and oxygen desaturations as low as 84%.  Now on CPAP at 12 cm H2O.   PAF (paroxysmal atrial fibrillation) [facility]) 2009   cardiologist-- dr Dietrich Pates   Paroxysmal atrial flutter [facility])    Allicia Culley. dx 11/2017.   PSVT (paroxysmal supraventricular tachycardia) (HCC)    Wears contact lenses     Past Surgical History:  Procedure Laterality Date   BREAST BIOPSY Right 05/15/2017   PASH   CARDIOVERSION N/Hurschel Paynter 08/28/2021   Procedure: CARDIOVERSION;  Surgeon: Pricilla Riffle, MD;  Location: [facility] ENDOSCOPY;  Service: Cardiovascular;  Laterality: N/Harol Shabazz;   CESAREAN SECTION  1980   ESOPHAGOGASTRODUODENOSCOPY N/Rody Keadle 02/08/2015   Procedure: ESOPHAGOGASTRODUODENOSCOPY (EGD);  Surgeon: Louis Meckel, MD;  Location: [facility] ENDOSCOPY;  Service: Endoscopy;  Laterality: N/Giovana Faciane;   KNEE ARTHROSCOPY W/ MENISCAL REPAIR Left 08/2014    @[facility]    SHOULDER SURGERY Right 04/04/2016   TOTAL KNEE ARTHROPLASTY Right 09/01/2018   Procedure: RIGHT TOTAL KNEE  ARTHROPLASTY;  Surgeon: Dannielle Huh, MD;  Location: [facility] ORS;  Service: Orthopedics;  Laterality: Right;    Social History  reports that she has never smoked. She has never used smokeless tobacco. She reports current alcohol use. She reports that she does not use drugs.  Allergies  Allergen Reactions   Epinephrine Base Other (See Comments)    Seriously increases heart rate   Aspirin Other (See Comments)    Can take the coated 325 mg. Plain asa 325 mg speeds up the heart.   Benadryl [Diphenhydramine]      Increased heart rate   Covid-19 Ad26 Vaccine(Janssen) Hives     hives 6 hrs after her COVID 19 2nd booster - resolved    Fish Allergy Hives    Was imitation crab meat and broke out in hives, had skin testing for lobster and showed allergic   Hylan G-F 20 Other (See Comments)    Very painful (knee injection)   Penicillins Other (See Comments)    Pt previously has been told not to take because of family history of reactions (water blisters). She took amoxicillin in 2012-2013 with no reaction   Tape Other (See Comments)    Blisters from adhesive tape    Family History  Problem Relation Age of Onset   Pancreatic cancer Brother    Lymphoma Mother    Prostate cancer Father        metastatic prostate cancer   Breast cancer Sister 25   Allergic rhinitis Sister    Urticaria Sister    Asthma Neg Hx    Eczema Neg Hx    Immunodeficiency Neg Hx    Atopy Neg Hx    Angioedema Neg Hx    Prior to Admission medications   Medication Sig Start Date End Date Taking? Authorizing Provider  acetaminophen (TYLENOL) 500 MG tablet Take 1,000 mg by mouth every 8 (eight) hours as needed for moderate pain (pain).   Yes [provider]  ALPRAZolam (XANAX) 0.25 MG tablet Take 1 tablet (0.25 mg total) by mouth 2 (two) times daily as needed for anxiety. 11/21/20  Yes Plotnikov, Georgina Quint, MD  apixaban (ELIQUIS) 5 MG TABS tablet Take 1 tablet (5 mg total) by mouth 2 (two) times daily. 04/26/21  Yes Pricilla Riffle, MD  Biotin 2500 MCG CAPS Take 2,500 mcg by mouth daily.   Yes [provider]  cholecalciferol (VITAMIN D3) 25 MCG (1000 UT) tablet Take 1,000 Units by mouth daily.   Yes [provider]  EPINEPHrine 0.3 mg/0.3 mL IJ SOAJ injection Inject 0.3 mg into the muscle as needed for anaphylaxis. As needed for life-threatening allergic reactions Patient taking differently: Inject 0.3 mg into the muscle once as needed for anaphylaxis. 08/03/20  Yes Padgett, Pilar Grammes, MD  flecainide  (TAMBOCOR) 50 MG tablet Take 1.5 tablets (75 mg total) by mouth 2 (two) times daily. 08/28/21  Yes Pricilla Riffle, MD  hydroxychloroquine (PLAQUENIL) 200 MG tablet Take 400 mg by mouth every morning.   Yes [provider]  metoprolol tartrate (LOPRESSOR) 25 MG tablet Take 1 tablet (25 mg total) by mouth 2 (two) times daily. 08/22/21 09/21/21 Yes Lanae Boast, MD  Multiple Vitamin (MULTIVITAMIN) capsule Take 1 capsule by mouth daily.   Yes [provider]  Probiotic Product (ALIGN) 4 MG CAPS Take 4 mg by mouth daily.   Yes [provider]  rosuvastatin (CRESTOR) 5 MG tablet Take 1 tablet (5 mg total) by mouth daily. 12/16/20  Yes Plotnikov,  Georgina Quint, MD  sertraline (ZOLOFT) 50 MG tablet TAKE 1 TABLET BY MOUTH DAILY Patient taking differently: Take 50 mg by mouth daily. 07/03/21  Yes Plotnikov, Georgina Quint, MD    Physical Exam: Vitals:   09/01/21 1221 09/01/21 1318 09/01/21 1530 09/01/21 1630  BP:   (!) 163/89 (!) 162/89  Pulse: 65 66 66 69  Resp: 16 17 20 19   Temp:      TempSrc:      SpO2: 100% 100% 100% 100%  Weight:      Height:        Constitutional: NAD, calm, comfortable Vitals:   09/01/21 1221 09/01/21 1318 09/01/21 1530 09/01/21 1630  BP:   (!) 163/89 (!) 162/89  Pulse: 65 66 66 69  Resp: 16 17 20 19   Temp:      TempSrc:      SpO2: 100% 100% 100% 100%  Weight:      Height:       Eyes: PERRL, lids and conjunctivae normal ENMT: Mucous membranes are moist. Posterior pharynx clear of any exudate or lesions.Normal dentition.  Neck: normal, supple,  Respiratory: unlabored Cardiovascular: RRR Abdomen: TTP in RUQ Musculoskeletal: no clubbing / cyanosis. No joint deformity upper and lower extremities. Good ROM, no contractures. Normal muscle tone.  Skin: no rashes, lesions, ulcers. No induration Neurologic: CN 2-12 grossly intact. Moving all extremities.  Psychiatric: Normal judgment and insight. Alert and oriented x 3. Normal mood.   Labs on Admission: I  have personally reviewed following labs and imaging studies  CBC: Recent Labs  Lab 09/01/21 0838  WBC 5.4  NEUTROABS 3.8  HGB 11.7*  HCT 35.0*  MCV 98.9  PLT 171    Basic Metabolic Panel: Recent Labs  Lab 09/01/21 1019  NA 135  K 4.5  CL 101  CO2 22  GLUCOSE 101*  BUN 13  CREATININE 0.77  CALCIUM 9.8    GFR: Estimated Creatinine Clearance: 74.8 mL/min (by C-G formula based on SCr of 0.77 mg/dL).  Liver Function Tests: Recent Labs  Lab 09/01/21 1019  AST 105*  ALT 192*  ALKPHOS 100  BILITOT 0.6  PROT 7.4  ALBUMIN 4.6    Urine analysis:    Component Value Date/Time   COLORURINE YELLOW 09/01/2021 0838   APPEARANCEUR CLEAR 09/01/2021 0838   LABSPEC 1.021 09/01/2021 0838   PHURINE 6.5 09/01/2021 0838   GLUCOSEU NEGATIVE 09/01/2021 0838   GLUCOSEU NEGATIVE 02/26/2017 1212   HGBUR NEGATIVE 09/01/2021 0838   BILIRUBINUR NEGATIVE 09/01/2021 0838   BILIRUBINUR neg 05/19/2020 1452   KETONESUR NEGATIVE 09/01/2021 0838   PROTEINUR NEGATIVE 09/01/2021 0838   UROBILINOGEN 0.2 05/19/2020 1452   UROBILINOGEN 0.2 02/26/2017 1212   NITRITE NEGATIVE 09/01/2021 0838   LEUKOCYTESUR NEGATIVE 09/01/2021 0838    Radiological Exams on Admission: CT ABDOMEN PELVIS W CONTRAST  Result Date: 09/01/2021 CLINICAL DATA:  Abdominal pain EXAM: CT ABDOMEN AND PELVIS WITH CONTRAST TECHNIQUE: Multidetector CT imaging of the abdomen and pelvis was performed using the standard protocol following bolus administration of intravenous contrast. CONTRAST:  OMNIPAQUE IOHEXOL 300 MG/ML  SOLN COMPARISON:  05/20/2020 FINDINGS: Lower chest: Possible minimal scarring is seen in the periphery of lower lung fields. Hepatobiliary: There is fatty infiltration in the liver. There is mild diffuse wall thickening in the gallbladder. There is pericholecystic fluid. There is no dilation of bile ducts. Pancreas: No focal abnormality is seen. Spleen: Unremarkable. Adrenals/Urinary Tract: Adrenals are not  enlarged. There is no hydronephrosis. There is malrotation in the  right kidney. There is 4 mm calculus in the lower pole of right kidney. Ureters are unremarkable. Urinary bladder is unremarkable. Stomach/Bowelstomach is not distended. Small bowel loops are not dilated. Appendix is not dilated. There is no significant wall thickening in the colon. Scattered diverticula are seen in colon without signs of focal acute diverticulitis. Vascular/Lymphatic: Unremarkable. Reproductive: Unremarkable. Other: There is no ascites or pneumoperitoneum. Small umbilical hernia containing fat is seen. Musculoskeletal: Degenerative changes noted in the lumbar spine, particularly severe at L2-L3, L3-L4 and L5-S1 levels. Degenerative changes are also noted in the lower thoracic spine. Similar findings were seen in the previous examination. IMPRESSION: There is wall thickening in the gallbladder along with pericholecystic fluid. Findings suggest possible acute cholecystitis. Follow-up sonogram and surgical consultation should be considered. There is no evidence of intestinal obstruction or pneumoperitoneum. There is no hydronephrosis. There is Teriyah Purington 4 mm nonobstructing right renal stone.  Fatty liver. Other findings as described in the body of the report. Electronically Signed   By: Ernie Avena M.D.   On: 09/01/2021 11:22   DG Chest Portable 1 View  Result Date: 09/01/2021 CLINICAL DATA:  Epigastric pain EXAM: PORTABLE CHEST 1 VIEW COMPARISON:  Previous studies including the examination of 08/19/2021 FINDINGS: Cardiac size is unremarkable. There are no signs of alveolar pulmonary edema or focal pulmonary consolidation. There is slight prominence of peribronchial interstitial markings with no significant interval change. There is no pleural effusion or pneumothorax. IMPRESSION: There are no new infiltrates or signs of pulmonary edema. Electronically Signed   By: Ernie Avena M.D.   On: 09/01/2021 08:56   US Abdomen  Limited RUQ (LIVER/GB)  Result Date: 09/01/2021 CLINICAL DATA:  Epigastric pain, nausea, 2 days EXAM: ULTRASOUND ABDOMEN LIMITED RIGHT UPPER QUADRANT COMPARISON:  Same day CT abdomen/pelvis FINDINGS: Gallbladder: Calcified gallstones are seen measuring up to 2.1 cm. There is marked gallbladder wall thickening measuring up to 1.6 cm. There is pericholecystic fluid, and Delaynee Alred positive sonographic Murphy's sign. Common bile duct: Diameter: 6 mm. Liver: No focal lesion identified. Within normal limits in parenchymal echogenicity. Portal vein is patent on color Doppler imaging with normal direction of blood flow towards the liver. Other: None. IMPRESSION: Acute cholecystitis. Electronically Signed   By: Lesia Hausen M.D.   On: 09/01/2021 13:18    EKG: Independently reviewed. Sinus rhythm, appears similar to priors  Assessment/Plan Principal Problem:   Acute cholecystitis Active Problems:   Cholecystitis   Elevated LFTs   AF (paroxysmal atrial fibrillation) (HCC)   Anemia   RA (rheumatoid arthritis) (HCC)   Adjustment disorder with mixed anxiety and depressed mood   HLD (hyperlipidemia)   Renal stone  * Acute cholecystitis 2 days RUQ abdominal pain CT abdomen/pelvis with wall thickening in gallbladder and pericholecystic fluid, concerning for acute cholecystitis RUQ [place] concerning for acute cholecystitsis  Surgery aware - recommending cardiac eval for risk stratification, hold eliquis, clear liquid diet for now Will continue abx (pcn allergy noted, tolerated amox previously)  Will c/s cardiology.  Given recent cardioversion, will start heparin gtt while this is on hold and minimize time off anticoagulation.  Elevated LFTs ? related to cholecystitis Normal bili and alk phos Follow acute hepatitis panel Hold crestor  AF (paroxysmal atrial fibrillation) (HCC) S/p recent cardioversion on 11/14 Holding eliquis at this time Start eliquis, minimize time off anticoagulation given recent  cardioversion Continue metoprolol, flecainide - she's no longer taking dilt, d/c'd from med list  Anemia Follow Anemia w/u  RA (rheumatoid arthritis) (HCC) Continue  plaquenil   Renal stone 4 mm nonobstructing right renal stone Follow outpatient  HLD (hyperlipidemia) crestor on hold with LFTs  Adjustment disorder with mixed anxiety and depressed mood Zoloft, xanax  DVT prophylaxis: Heparin gtt Code Status:   Full  Family Communication:   Disposition Plan:   Patient is from:  home  Anticipated DC to:  home  Anticipated DC date:  Pending surgery  Anticipated DC barriers: surgery  Consults called:  Cardiology, surgery Admission status:  inpatient   Severity of Illness: The appropriate patient status for this patient is INPATIENT. Inpatient status is judged to be reasonable and necessary in order to provide the required intensity of service to ensure the patient's safety. The patient's presenting symptoms, physical exam findings, and initial radiographic and laboratory data in the context of their chronic comorbidities is felt to place them at high risk for further clinical deterioration. Furthermore, it is not anticipated that the patient will be medically stable for discharge from the hospital within 2 midnights of admission.   * I certify that at the point of admission it is my clinical judgment that the patient will require inpatient hospital care spanning beyond 2 midnights from the point of admission due to high intensity of service, high risk for further deterioration and high frequency of surveillance required.Lacretia Nicks MD [facility]  How to contact the [facility] Attending or Consulting provider 7A - 7P or covering provider during after hours 7P -7A, for this patient?   Check the care team in [facility] and look for Dariel Betzer) attending/consulting [facility] provider listed and b) the [facility] team listed Log into [email or web address] and use [place]'s universal password to access. If  you do not have the password, please contact the hospital operator. Locate the [facility] provider you are looking for under [facility] and page to Jeslynn Hollander number that you can be directly reached. If you still have difficulty reaching the provider, please page the [facility] (Director on Call) for the Hospitalists listed on amion for assistance.  09/01/2021, 5:54 PM

## 2021-09-01 NOTE — Assessment & Plan Note (Signed)
Continue plaquenil

## 2021-09-01 NOTE — Assessment & Plan Note (Addendum)
Follow Anemia w/u - c/w AOCD

## 2021-09-01 NOTE — ED Triage Notes (Signed)
Pt arrives pov with c/I epigastric pain radiating left and right under breasts with nausea x 2 days.

## 2021-09-01 NOTE — Assessment & Plan Note (Signed)
crestor on hold with LFTs

## 2021-09-01 NOTE — ED Notes (Signed)
Report given to Sonne, RN at [facility] accepting pt

## 2021-09-01 NOTE — Assessment & Plan Note (Signed)
4 mm nonobstructing right renal stone Follow outpatient

## 2021-09-01 NOTE — Consult Note (Signed)
Amanda Wilkins [birth year]  [id number].    Requesting MD: Dr. Wynona Dove Chief Complaint/Reason for Consult: acute cholecystitis   HPI:  This is a 75 yo white female with a history of A fib (just cardioverted on 11/14, on Eliquis(LD 11/18 am)), HTN, CAD, OSA on CPAP, RA, anemia, asthma, HLD, and anxiety and depression who was just discharged from the hospital on 11/8 secondary to lightheadedness and fatigue.  She was found to be in a fib.  She followed up cardiology as an outpatient and underwent cardioversion on 11/14.   She presented to [facility] today with a one day history of RUQ and epigastric abdominal pain.  She states this is worse when she lays down or when she's walking.  This is a tight feeling around her upper abdomen.  She denies any N/V, chest pain, SOB, or any other acute new symptoms.  She has been found to have a normal WBC.   She underwent a CT and abdominal US, both c/w acute cholecystitis.  Her US shows a 2.1cm calcified gallstone with 1.6cm of wall thickening and pericholecystic fluid.  We have been asked to see her for evaluation of her gallbladder.  She has had known gallstones for some time with intermittent symptoms.  ROS: Review of Systems  All other systems reviewed and are negative.: Please see HPI, otherwise all other systems have been reviewed and are negative.  Family History  Problem Relation Age of Onset   Pancreatic cancer Brother    Lymphoma Mother    Prostate cancer Father        metastatic prostate cancer   Breast cancer Sister 19   Allergic rhinitis Sister    Urticaria Sister    Asthma Neg Hx    Eczema Neg Hx    Immunodeficiency Neg Hx    Atopy Neg Hx    Angioedema Neg Hx     Past Medical History:  Diagnosis Date   Acute bronchitis 09/11/2016   11/17 refractory   Acute cystitis without hematuria 05/17/2015   Acute kidney injury ([place])    Labs today   Acute right ankle pain 09/09/2018   Anticoagulant long-term use    Xarelto    Axillary pain    Bronchitis    Cellulitis    Cholecystitis    Chronic interstitial nephritis    CONJUNCTIVITIS, ACUTE 10/18/2010   Qualifier: Diagnosis of  By: Plotnikov MD, Evie Lacks    D-dimer, elevated    Depression    Eye inflammation    History of interstitial nephritis 2016   chronic   History of nuclear stress test 12/16/2014   Intermediate risk nuclear study w/ medium size moderate severity reversible defect in the basal and mid inferolateral and inferior wall (per dr cardiology note , dr [name] did not think this was consistent with ischemia)/  normal LV function and wall motion, ef 75%   History of septic shock 01/22/2015   in setting Group A Strep Cellulits erysipelas/chest wall induration with streptoccocus basterium-- Severe sepsis, DIC, Acute respiratory failure with pulmonary edema, Acute Kidney failure with chronic interstitial nephritis   Hypertension    Hyponatremia    Migraines    on Zoloft for migraines   Mild carotid artery disease ([place])    per duplex 08-30-2017 bilateral ICA 1-39%   OA (osteoarthritis) rheumotologist-  dr [name]   both knees,  shoulders, ankles   OSA on CPAP     moderate obstructive sleep apnea with an AHI  of 23.4/h and oxygen desaturations as low as 84%.  Now on CPAP at 12 cm H2O.   PAF (paroxysmal atrial fibrillation) [facility]) 2009   cardiologist-- dr [name]   Paroxysmal atrial flutter [facility])    a. dx 11/2017.   PSVT (paroxysmal supraventricular tachycardia) ([place])    Wears contact lenses     Past Surgical History:  Procedure Laterality Date   BREAST BIOPSY Right 05/15/2017   [place]   CARDIOVERSION N/A 08/28/2021   Procedure: CARDIOVERSION;  Surgeon: Fay Records, MD;  Location: [facility] ENDOSCOPY;  Service: Cardiovascular;  Laterality: N/A;   [place]   ESOPHAGOGASTRODUODENOSCOPY N/A 02/08/2015   Procedure: ESOPHAGOGASTRODUODENOSCOPY (EGD);  Surgeon: [name], MD;  Location: [facility];  Service: Endoscopy;   Laterality: N/A;   KNEE ARTHROSCOPY W/ MENISCAL REPAIR Left 08/2014    @[facility]    SHOULDER SURGERY Right 04/04/2016   TOTAL KNEE ARTHROPLASTY Right 09/01/2018   Procedure: RIGHT TOTAL KNEE ARTHROPLASTY;  Surgeon: [name], MD;  Location: [facility] ORS;  Service: Orthopedics;  Laterality: Right;    Social History:  reports that she has never smoked. She has never used smokeless tobacco. She reports current alcohol use. She reports that she does not use drugs.  Allergies:  Allergies  Allergen Reactions   Epinephrine Base Other (See Comments)    Seriously increases heart rate   Aspirin Other (See Comments)    Can take the coated 325 mg. Plain asa 325 mg speeds up the heart.   Benadryl [Diphenhydramine]     Increased heart rate   Covid-19 Ad26 Vaccine(Janssen) Hives     hives 6 hrs after her COVID 19 2nd booster - resolved    Fish Allergy Hives    Was imitation crab meat and broke out in hives, had skin testing for lobster and showed allergic   Hylan G-F 20 Other (See Comments)    Very painful (knee injection)   Penicillins Other (See Comments)    Pt previously has been told not to take because of family history of reactions (water blisters). She took amoxicillin in 2012-2013 with no reaction   Tape Other (See Comments)    Blisters from adhesive tape     Physical Exam: Blood pressure (!) 153/88, pulse 66, temperature 98.5 F (36.9 C), temperature source Oral, resp. rate 17, height 5\' 5"  (1.651 m), weight 106.6 kg, SpO2 100 %. General: pleasant, WD, WN white female who is laying in bed in NAD HEENT: head is normocephalic, atraumatic.  Sclera are noninjected.  PERRL.  Ears and nose without any masses or lesions.  Heart: rrr Palpable radial and pedal pulses bilaterally Lungs: CTAB, Respiratory effort nonlabored Abd: soft, tender in RUQ, ND, +BS, no masses, hernias, or organomegaly MS: all 4 extremities are symmetrical with no cyanosis, clubbing, or edema. Skin: warm and dry with no  masses, lesions, or rashes Neuro: no gross defects Psych: A&Ox3 with an appropriate affect.   Results for orders placed or performed during the hospital encounter of 09/01/21 (from the past 48 hour(s))  CBC with Differential     Status: Abnormal   Collection Time: 09/01/21  8:38 AM  Result Value Ref Range   WBC 5.4 4.0 - 10.5 K/uL   RBC 3.54 (L) 3.87 - 5.11 MIL/uL   Hemoglobin 11.7 (L) 12.0 - 15.0 g/dL   HCT 35.0 (L) 36.0 - 46.0 %   MCV 98.9 80.0 - 100.0 fL   MCH 33.1 26.0 - 34.0 pg   MCHC 33.4 30.0 -  36.0 g/dL   RDW 12.6 11.5 - 15.5 %   Platelets 171 150 - 400 K/uL   nRBC 0.0 0.0 - 0.2 %   Neutrophils Relative % 70 %   Neutro Abs 3.8 1.7 - 7.7 K/uL   Lymphocytes Relative 21 %   Lymphs Abs 1.1 0.7 - 4.0 K/uL   Monocytes Relative 6 %   Monocytes Absolute 0.3 0.1 - 1.0 K/uL   Eosinophils Relative 2 %   Eosinophils Absolute 0.1 0.0 - 0.5 K/uL   Basophils Relative 1 %   Basophils Absolute 0.1 0.0 - 0.1 K/uL   Immature Granulocytes 0 %   Abs Immature Granulocytes 0.01 0.00 - 0.07 K/uL    Comment: Performed at [facility], [place], [place] [address]  Urinalysis, Routine w reflex microscopic     Status: None   Collection Time: 09/01/21  8:38 AM  Result Value Ref Range   Color, Urine YELLOW YELLOW   APPearance CLEAR CLEAR   Specific Gravity, Urine 1.021 1.005 - 1.030   pH 6.5 5.0 - 8.0   Glucose, UA NEGATIVE NEGATIVE mg/dL   Hgb urine dipstick NEGATIVE NEGATIVE   Bilirubin Urine NEGATIVE NEGATIVE   Ketones, ur NEGATIVE NEGATIVE mg/dL   Protein, ur NEGATIVE NEGATIVE mg/dL   Nitrite NEGATIVE NEGATIVE   Leukocytes,Ua NEGATIVE NEGATIVE    Comment: Performed at [facility], [place], [place] [address]  Troponin I (High Sensitivity)     Status: None   Collection Time: 09/01/21  8:38 AM  Result Value Ref Range   Troponin I (High Sensitivity) 4 <18 ng/L    Comment: (NOTE) Elevated high sensitivity troponin I  (hsTnI) values and significant  changes across serial measurements may suggest ACS but many other  chronic and acute conditions are known to elevate hsTnI results.  Refer to the "Links" section for chest pain algorithms and additional  guidance. Performed at [facility], [address], [place], [place] [address]   Resp Panel by RT-PCR (Flu A&B, Covid) Nasopharyngeal Swab     Status: None   Collection Time: 09/01/21  8:39 AM   Specimen: Nasopharyngeal Swab; Nasopharyngeal(NP) swabs in vial transport medium  Result Value Ref Range   SARS Coronavirus 2 by RT PCR NEGATIVE NEGATIVE    Comment: (NOTE) SARS-CoV-2 target nucleic acids are NOT DETECTED.  The SARS-CoV-2 RNA is generally detectable in upper respiratory specimens during the acute phase of infection. The lowest concentration of SARS-CoV-2 viral copies this assay can detect is 138 copies/mL. A negative result does not preclude SARS-Cov-2 infection and should not be used as the sole basis for treatment or other patient management decisions. A negative result may occur with  improper specimen collection/handling, submission of specimen other than nasopharyngeal swab, presence of viral mutation(s) within the areas targeted by this assay, and inadequate number of viral copies(<138 copies/mL). A negative result must be combined with clinical observations, patient history, and epidemiological information. The expected result is Negative.  Fact Sheet for Patients:  EntrepreneurPulse.com.au  Fact Sheet for Healthcare Providers:  IncredibleEmployment.be  This test is no t yet approved or cleared by the [place] FDA and  has been authorized for detection and/or diagnosis of SARS-CoV-2 by FDA under an Emergency Use Authorization (EUA). This EUA will remain  in effect (meaning this test can be used) for the duration of the COVID-19 declaration under Section 564(b)(1) of the  Act, 21 U.S.C.section 360bbb-3(b)(1), unless the authorization is terminated  or  revoked sooner.       Influenza A by PCR NEGATIVE NEGATIVE   Influenza B by PCR NEGATIVE NEGATIVE    Comment: (NOTE) The Xpert Xpress SARS-CoV-2/FLU/RSV plus assay is intended as an aid in the diagnosis of influenza from Nasopharyngeal swab specimens and should not be used as a sole basis for treatment. Nasal washings and aspirates are unacceptable for Xpert Xpress SARS-CoV-2/FLU/RSV testing.  Fact Sheet for Patients: EntrepreneurPulse.com.au  Fact Sheet for Healthcare Providers: IncredibleEmployment.be  This test is not yet approved or cleared by the [place] FDA and has been authorized for detection and/or diagnosis of SARS-CoV-2 by FDA under an Emergency Use Authorization (EUA). This EUA will remain in effect (meaning this test can be used) for the duration of the COVID-19 declaration under Section 564(b)(1) of the Act, 21 U.S.C. section 360bbb-3(b)(1), unless the authorization is terminated or revoked.  Performed at [facility], [address], [place], [place] [address]   Troponin I (High Sensitivity)     Status: None   Collection Time: 09/01/21 10:19 AM  Result Value Ref Range   Troponin I (High Sensitivity) 4 <18 ng/L    Comment: (NOTE) Elevated high sensitivity troponin I (hsTnI) values and significant  changes across serial measurements may suggest ACS but many other  chronic and acute conditions are known to elevate hsTnI results.  Refer to the "Links" section for chest pain algorithms and additional  guidance. Performed at [facility], [address] [place], [place] [address]    CT ABDOMEN PELVIS W CONTRAST  Result Date: 09/01/2021 CLINICAL DATA:  Abdominal pain EXAM: CT ABDOMEN AND PELVIS WITH CONTRAST TECHNIQUE: Multidetector CT imaging of the abdomen and pelvis was performed using the  standard protocol following bolus administration of intravenous contrast. CONTRAST:  166mL OMNIPAQUE IOHEXOL 300 MG/ML  SOLN COMPARISON:  05/20/2020 FINDINGS: Lower chest: Possible minimal scarring is seen in the periphery of lower lung fields. Hepatobiliary: There is fatty infiltration in the liver. There is mild diffuse wall thickening in the gallbladder. There is pericholecystic fluid. There is no dilation of bile ducts. Pancreas: No focal abnormality is seen. Spleen: Unremarkable. Adrenals/Urinary Tract: Adrenals are not enlarged. There is no hydronephrosis. There is malrotation in the right kidney. There is 4 mm calculus in the lower pole of right kidney. Ureters are unremarkable. Urinary bladder is unremarkable. Stomach/Bowelstomach is not distended. Small bowel loops are not dilated. Appendix is not dilated. There is no significant wall thickening in the colon. Scattered diverticula are seen in colon without signs of focal acute diverticulitis. Vascular/Lymphatic: Unremarkable. Reproductive: Unremarkable. Other: There is no ascites or pneumoperitoneum. Small umbilical hernia containing fat is seen. Musculoskeletal: Degenerative changes noted in the lumbar spine, particularly severe at L2-L3, L3-L4 and L5-S1 levels. Degenerative changes are also noted in the lower thoracic spine. Similar findings were seen in the previous examination. IMPRESSION: There is wall thickening in the gallbladder along with pericholecystic fluid. Findings suggest possible acute cholecystitis. Follow-up sonogram and surgical consultation should be considered. There is no evidence of intestinal obstruction or pneumoperitoneum. There is no hydronephrosis. There is a 4 mm nonobstructing right renal stone.  Fatty liver. Other findings as described in the body of the report. Electronically Signed   By: Elmer Picker M.D.   On: 09/01/2021 11:22   DG Chest Portable 1 View  Result Date: 09/01/2021 CLINICAL DATA:  Epigastric pain  EXAM: PORTABLE CHEST 1 VIEW COMPARISON:  Previous studies including the examination of 08/19/2021 FINDINGS: Cardiac size is unremarkable.  There are no signs of alveolar pulmonary edema or focal pulmonary consolidation. There is slight prominence of peribronchial interstitial markings with no significant interval change. There is no pleural effusion or pneumothorax. IMPRESSION: There are no new infiltrates or signs of pulmonary edema. Electronically Signed   By: Elmer Picker M.D.   On: 09/01/2021 08:56   US Abdomen Limited RUQ (LIVER/GB)  Result Date: 09/01/2021 CLINICAL DATA:  Epigastric pain, nausea, 2 days EXAM: ULTRASOUND ABDOMEN LIMITED RIGHT UPPER QUADRANT COMPARISON:  Same day CT abdomen/pelvis FINDINGS: Gallbladder: Calcified gallstones are seen measuring up to 2.1 cm. There is marked gallbladder wall thickening measuring up to 1.6 cm. There is pericholecystic fluid, and a positive sonographic Murphy's sign. Common bile duct: Diameter: 6 mm. Liver: No focal lesion identified. Within normal limits in parenchymal echogenicity. Portal vein is patent on color Doppler imaging with normal direction of blood flow towards the liver. Other: None. IMPRESSION: Acute cholecystitis. Electronically Signed   By: Valetta Mole M.D.   On: 09/01/2021 13:18      Assessment/Plan Acute cholecystitis The patient appears to have acute cholecystitis.  Would need to start abxShe is also on Eliquis for her a fib.  Her last dose was this morning, 11/18.  This will need to be held.  She will also need cardiac evaluation for surgical risk stratification.  Given her recent happenings, if she is considered higher risk, we could proceed with a perc chole drain in the interim to get her through to a safe time for a cholecystectomy. If she will have less stroke risk off ac in 4-6 weeks this would be reasonable.  If she is cleared though, we would likely plan for lap chole around Monday or so given 48 hr need off  anticoagulation.  She may have a clear liquid diet from our standpoint to see how she tolerates this.   We will continue to follow with you at this time.   FEN - may have CLD from our standpoint VTE - hold eliquis, may do iv heparin ID - Rocephin/Flagyl  HTN A fib, on eliquis - cards eval for risk stratification HLD Depression/Anxiety RA OSA, on CPAP CAD Asthma

## 2021-09-01 NOTE — Assessment & Plan Note (Signed)
Zoloft, xanax

## 2021-09-01 NOTE — Progress Notes (Addendum)
ANTICOAGULATION CONSULT NOTE - Initial Consult  Pharmacy Consult for Heparin Indication: atrial fibrillation  Allergies  Allergen Reactions   Epinephrine Base Other (See Comments)    Seriously increases heart rate   Aspirin Other (See Comments)    Can take the coated 325 mg. Plain asa 325 mg speeds up the heart.   Benadryl [Diphenhydramine]     Increased heart rate   Covid-19 Ad26 Vaccine(Janssen) Hives     hives 6 hrs after her COVID 19 2nd booster - resolved    Fish Allergy Hives    Was imitation crab meat and broke out in hives, had skin testing for lobster and showed allergic   Hylan G-F 20 Other (See Comments)    Very painful (knee injection)   Penicillins Other (See Comments)    Pt previously has been told not to take because of family history of reactions (water blisters). She took amoxicillin in 2012-2013 with no reaction   Tape Other (See Comments)    Blisters from adhesive tape    Patient Measurements: Height: 5\' 5"  (165.1 cm) Weight: 106.6 kg (235 lb) IBW/kg (Calculated) : 57 Heparin Dosing Weight: 81.9 kg  Vital Signs: Temp: 98.5 F (36.9 C) (11/18 0824) Temp Source: Oral (11/18 0824) BP: 162/89 (11/18 1630) Pulse Rate: 69 (11/18 1630)  Labs: Recent Labs    09/01/21 0838 09/01/21 1019  HGB 11.7*  --   HCT 35.0*  --   PLT 171  --   CREATININE  --  0.77  TROPONINIHS 4 4    Estimated Creatinine Clearance: 74.8 mL/min (by C-G formula based on SCr of 0.77 mg/dL).   Medical History: Past Medical History:  Diagnosis Date   Acute bronchitis 09/11/2016   11/17 refractory   Acute cystitis without hematuria 05/17/2015   Acute kidney injury [facility])    Labs today   Acute right ankle pain 09/09/2018   Anticoagulant long-term use    Xarelto   Axillary pain    Bronchitis    Cellulitis    Cholecystitis    Chronic interstitial nephritis    CONJUNCTIVITIS, ACUTE 10/18/2010   Qualifier: Diagnosis of  By: Alain Marion MD, Evie Lacks    D-dimer, elevated     Depression    Eye inflammation    History of interstitial nephritis 2016   chronic   History of nuclear stress test 12/16/2014   Intermediate risk nuclear study w/ medium size moderate severity reversible defect in the basal and mid inferolateral and inferior wall (per dr cardiology note , dr [name] did not think this was consistent with ischemia)/  normal LV function and wall motion, ef 75%   History of septic shock 01/22/2015   in setting Group A Strep Cellulits erysipelas/chest wall induration with streptoccocus basterium-- Severe sepsis, DIC, Acute respiratory failure with pulmonary edema, Acute Kidney failure with chronic interstitial nephritis   Hypertension    Hyponatremia    Migraines    on Zoloft for migraines   Mild carotid artery disease ([place])    per duplex 08-30-2017 bilateral ICA 1-39%   OA (osteoarthritis) rheumotologist-  dr [name]   both knees,  shoulders, ankles   OSA on CPAP     moderate obstructive sleep apnea with an AHI of 23.4/h and oxygen desaturations as low as 84%.  Now on CPAP at 12 cm H2O.   PAF (paroxysmal atrial fibrillation) [facility]) 2009   cardiologist-- dr [name]   Paroxysmal atrial flutter [facility])    a. dx 11/2017.  PSVT (paroxysmal supraventricular tachycardia) ([place])    Wears contact lenses     Medications:  Infusions:   [START ON 09/02/2021] cefTRIAXone (ROCEPHIN)  IV      Assessment: 9 yoF admitted on 11/18 with abdominal pain, [place] concerning for acute cholecystitsis.  PMH significant for Afib s/p recent cardioversion on 11/14 on apixaban.  Pharmacy is consulted to dose heparin IV while off apixaban for possible procedures.  PTA apixaban 5mg  BID.  LD 11/18 at [place] pending:  Admission CBC: Hgb low at 11.7, Plt WNL Note LFTs acutely elevated.   SCr WNL  Goal of Therapy:  Heparin level 0.3-0.7 units/ml aPTT 66-102 seconds Monitor platelets by anticoagulation protocol: Yes   Plan:  Start heparin IV infusion at 1250  units/hr at 20:00 when next dose of apixaban would be due.  APTT 8 hours after starting Daily APTT, heparin level, and CBC Follow up surgical plans   Gretta Arab PharmD, BCPS Clinical Pharmacist [facility] main pharmacy [phone] 09/01/2021 6:22 PM
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
## 2021-09-01 NOTE — ED Notes (Signed)
ED TO INPATIENT HANDOFF REPORT  ED Nurse Name and Phone #: Fernanda Drum Name/Age/Gender Amanda Wilkins 75 y.o. female Room/Bed: DB013/DB013  Code Status   Code Status: Prior  Home/SNF/Other Home Patient oriented to: self, place, time, and situation Is this baseline? Yes   Triage Complete: Triage complete  Chief Complaint Acute cholecystitis [K81.0]  Triage Note Pt arrives pov with c/I epigastric pain radiating left and right under breasts with nausea x 2 days.    Allergies Allergies  Allergen Reactions   Epinephrine Base Other (See Comments)    Seriously increases heart rate   Aspirin Other (See Comments)    Can take the coated 325 mg. Plain asa 325 mg speeds up the heart.   Benadryl [Diphenhydramine]     Increased heart rate   Covid-19 Ad26 Vaccine(Janssen) Hives     hives 6 hrs after her COVID 19 2nd booster - resolved    Fish Allergy Hives    Was imitation crab meat and broke out in hives, had skin testing for lobster and showed allergic   Hylan G-F 20 Other (See Comments)    Very painful (knee injection)   Penicillins Other (See Comments)    Pt previously has been told not to take because of family history of reactions (water blisters). She took amoxicillin in 2012-2013 with no reaction   Tape Other (See Comments)    Blisters from adhesive tape    Level of Care/Admitting Diagnosis ED Disposition     ED Disposition  Admit   Condition  --   [place]: [facility] [[id number]]  Level of Care: Telemetry [5]  Admit to tele based on following criteria: Complex arrhythmia (Bradycardia/Tachycardia)  Interfacility transfer: Yes  May place patient in observation at [facility] or [place] if equivalent level of care is available:: Yes  Covid Evaluation: Asymptomatic Screening Protocol (No Symptoms)  Diagnosis: Acute cholecystitis [575.0.ICD-9-CM]  Admitting Physician: Reubin Milan [[id number]]  Attending Physician: Reubin Milan [[id number]]          B Medical/Surgery History Past Medical History:  Diagnosis Date   Acute bronchitis 09/11/2016   11/17 refractory   Acute cystitis without hematuria 05/17/2015   Acute kidney injury [facility])    Labs today   Acute right ankle pain 09/09/2018   Anticoagulant long-term use    Xarelto   Axillary pain    Bronchitis    Cellulitis    Cholecystitis    Chronic interstitial nephritis    CONJUNCTIVITIS, ACUTE 10/18/2010   Qualifier: Diagnosis of  By: Alain Marion MD, Evie Lacks    D-dimer, elevated    Depression    Eye inflammation    History of interstitial nephritis 2016   chronic   History of nuclear stress test 12/16/2014   Intermediate risk nuclear study w/ medium size moderate severity reversible defect in the basal and mid inferolateral and inferior wall (per dr cardiology note , dr [name] did not think this was consistent with ischemia)/  normal LV function and wall motion, ef 75%   History of septic shock 01/22/2015   in setting Group A Strep Cellulits erysipelas/chest wall induration with streptoccocus basterium-- Severe sepsis, DIC, Acute respiratory failure with pulmonary edema, Acute Kidney failure with chronic interstitial nephritis   Hypertension    Hyponatremia    Migraines    on Zoloft for migraines   Mild carotid artery disease ([place])    per duplex 08-30-2017 bilateral ICA 1-39%   OA (osteoarthritis)  rheumotologist-  dr [name]   both knees,  shoulders, ankles   OSA on CPAP     moderate obstructive sleep apnea with an AHI of 23.4/h and oxygen desaturations as low as 84%.  Now on CPAP at 12 cm H2O.   PAF (paroxysmal atrial fibrillation) [facility]) 2009   cardiologist-- dr [name]   Paroxysmal atrial flutter [facility])    a. dx 11/2017.   PSVT (paroxysmal supraventricular tachycardia) ([place])    Wears contact lenses    Past Surgical History:  Procedure Laterality Date   BREAST BIOPSY Right 05/15/2017   [place]   CARDIOVERSION N/A 08/28/2021    Procedure: CARDIOVERSION;  Surgeon: Fay Records, MD;  Location: [facility] ENDOSCOPY;  Service: Cardiovascular;  Laterality: N/A;   [place]   ESOPHAGOGASTRODUODENOSCOPY N/A 02/08/2015   Procedure: ESOPHAGOGASTRODUODENOSCOPY (EGD);  Surgeon: [name], MD;  Location: [place];  Service: Endoscopy;  Laterality: N/A;   KNEE ARTHROSCOPY W/ MENISCAL REPAIR Left 08/2014    @[facility]    SHOULDER SURGERY Right 04/04/2016   TOTAL KNEE ARTHROPLASTY Right 09/01/2018   Procedure: RIGHT TOTAL KNEE ARTHROPLASTY;  Surgeon: [name], MD;  Location: [facility] ORS;  Service: Orthopedics;  Laterality: Right;     A IV Location/Drains/Wounds Patient Lines/Drains/Airways Status     Active Line/Drains/Airways     Name Placement date Placement time Site Days   Peripheral IV 09/01/21 20 G Right Antecubital 09/01/21  0917  Antecubital  less than 1   Incision (Closed) 09/01/18 Knee Right 09/01/18  0846  -- 1096            Intake/Output Last 24 hours  Intake/Output Summary (Last 24 hours) at 09/01/2021 1427 Last data filed at 09/01/2021 1253 Gross per 24 hour  Intake 135.74 ml  Output --  Net 135.74 ml    Labs/Imaging Results for orders placed or performed during the hospital encounter of 09/01/21 (from the past 48 hour(s))  CBC with Differential     Status: Abnormal   Collection Time: 09/01/21  8:38 AM  Result Value Ref Range   WBC 5.4 4.0 - 10.5 K/uL   RBC 3.54 (L) 3.87 - 5.11 MIL/uL   Hemoglobin 11.7 (L) 12.0 - 15.0 g/dL   HCT 35.0 (L) 36.0 - 46.0 %   MCV 98.9 80.0 - 100.0 fL   MCH 33.1 26.0 - 34.0 pg   MCHC 33.4 30.0 - 36.0 g/dL   RDW 12.6 11.5 - 15.5 %   Platelets 171 150 - 400 K/uL   nRBC 0.0 0.0 - 0.2 %   Neutrophils Relative % 70 %   Neutro Abs 3.8 1.7 - 7.7 K/uL   Lymphocytes Relative 21 %   Lymphs Abs 1.1 0.7 - 4.0 K/uL   Monocytes Relative 6 %   Monocytes Absolute 0.3 0.1 - 1.0 K/uL   Eosinophils Relative 2 %   Eosinophils Absolute 0.1 0.0 - 0.5 K/uL    Basophils Relative 1 %   Basophils Absolute 0.1 0.0 - 0.1 K/uL   Immature Granulocytes 0 %   Abs Immature Granulocytes 0.01 0.00 - 0.07 K/uL    Comment: Performed at [facility], [place], [place] [address]  Urinalysis, Routine w reflex microscopic     Status: None   Collection Time: 09/01/21  8:38 AM  Result Value Ref Range   Color, Urine YELLOW YELLOW   APPearance CLEAR CLEAR   Specific Gravity, Urine 1.021 1.005 - 1.030   pH 6.5 5.0 - 8.0  Glucose, UA NEGATIVE NEGATIVE mg/dL   Hgb urine dipstick NEGATIVE NEGATIVE   Bilirubin Urine NEGATIVE NEGATIVE   Ketones, ur NEGATIVE NEGATIVE mg/dL   Protein, ur NEGATIVE NEGATIVE mg/dL   Nitrite NEGATIVE NEGATIVE   Leukocytes,Ua NEGATIVE NEGATIVE    Comment: Performed at [facility], [place], [place] [address]  Troponin I (High Sensitivity)     Status: None   Collection Time: 09/01/21  8:38 AM  Result Value Ref Range   Troponin I (High Sensitivity) 4 <18 ng/L    Comment: (NOTE) Elevated high sensitivity troponin I (hsTnI) values and significant  changes across serial measurements may suggest ACS but many other  chronic and acute conditions are known to elevate hsTnI results.  Refer to the "Links" section for chest pain algorithms and additional  guidance. Performed at [facility], [address] [place], [place] [address]   Resp Panel by RT-PCR (Flu A&B, Covid) Nasopharyngeal Swab     Status: None   Collection Time: 09/01/21  8:39 AM   Specimen: Nasopharyngeal Swab; Nasopharyngeal(NP) swabs in vial transport medium  Result Value Ref Range   SARS Coronavirus 2 by RT PCR NEGATIVE NEGATIVE    Comment: (NOTE) SARS-CoV-2 target nucleic acids are NOT DETECTED.  The SARS-CoV-2 RNA is generally detectable in upper respiratory specimens during the acute phase of infection. The lowest concentration of SARS-CoV-2 viral copies this assay can detect  is 138 copies/mL. A negative result does not preclude SARS-Cov-2 infection and should not be used as the sole basis for treatment or other patient management decisions. A negative result may occur with  improper specimen collection/handling, submission of specimen other than nasopharyngeal swab, presence of viral mutation(s) within the areas targeted by this assay, and inadequate number of viral copies(<138 copies/mL). A negative result must be combined with clinical observations, patient history, and epidemiological information. The expected result is Negative.  Fact Sheet for Patients:  EntrepreneurPulse.com.au  Fact Sheet for Healthcare Providers:  IncredibleEmployment.be  This test is no t yet approved or cleared by the [place] FDA and  has been authorized for detection and/or diagnosis of SARS-CoV-2 by FDA under an Emergency Use Authorization (EUA). This EUA will remain  in effect (meaning this test can be used) for the duration of the COVID-19 declaration under Section 564(b)(1) of the Act, 21 U.S.C.section 360bbb-3(b)(1), unless the authorization is terminated  or revoked sooner.       Influenza A by PCR NEGATIVE NEGATIVE   Influenza B by PCR NEGATIVE NEGATIVE    Comment: (NOTE) The Xpert Xpress SARS-CoV-2/FLU/RSV plus assay is intended as an aid in the diagnosis of influenza from Nasopharyngeal swab specimens and should not be used as a sole basis for treatment. Nasal washings and aspirates are unacceptable for Xpert Xpress SARS-CoV-2/FLU/RSV testing.  Fact Sheet for Patients: EntrepreneurPulse.com.au  Fact Sheet for Healthcare Providers: IncredibleEmployment.be  This test is not yet approved or cleared by the [place] FDA and has been authorized for detection and/or diagnosis of SARS-CoV-2 by FDA under an Emergency Use Authorization (EUA). This EUA will remain in effect (meaning this  test can be used) for the duration of the COVID-19 declaration under Section 564(b)(1) of the Act, 21 U.S.C. section 360bbb-3(b)(1), unless the authorization is terminated or revoked.  Performed at [facility], [place], [place] [address]   Troponin I (High Sensitivity)     Status: None   Collection Time: 09/01/21 10:19 AM  Result Value Ref Range  Troponin I (High Sensitivity) 4 <18 ng/L    Comment: (NOTE) Elevated high sensitivity troponin I (hsTnI) values and significant  changes across serial measurements may suggest ACS but many other  chronic and acute conditions are known to elevate hsTnI results.  Refer to the "Links" section for chest pain algorithms and additional  guidance. Performed at [facility], [address], [place], [place] [address]    CT ABDOMEN PELVIS W CONTRAST  Result Date: 09/01/2021 CLINICAL DATA:  Abdominal pain EXAM: CT ABDOMEN AND PELVIS WITH CONTRAST TECHNIQUE: Multidetector CT imaging of the abdomen and pelvis was performed using the standard protocol following bolus administration of intravenous contrast. CONTRAST:  138mL OMNIPAQUE IOHEXOL 300 MG/ML  SOLN COMPARISON:  05/20/2020 FINDINGS: Lower chest: Possible minimal scarring is seen in the periphery of lower lung fields. Hepatobiliary: There is fatty infiltration in the liver. There is mild diffuse wall thickening in the gallbladder. There is pericholecystic fluid. There is no dilation of bile ducts. Pancreas: No focal abnormality is seen. Spleen: Unremarkable. Adrenals/Urinary Tract: Adrenals are not enlarged. There is no hydronephrosis. There is malrotation in the right kidney. There is 4 mm calculus in the lower pole of right kidney. Ureters are unremarkable. Urinary bladder is unremarkable. Stomach/Bowelstomach is not distended. Small bowel loops are not dilated. Appendix is not dilated. There is no significant wall thickening in the colon.  Scattered diverticula are seen in colon without signs of focal acute diverticulitis. Vascular/Lymphatic: Unremarkable. Reproductive: Unremarkable. Other: There is no ascites or pneumoperitoneum. Small umbilical hernia containing fat is seen. Musculoskeletal: Degenerative changes noted in the lumbar spine, particularly severe at L2-L3, L3-L4 and L5-S1 levels. Degenerative changes are also noted in the lower thoracic spine. Similar findings were seen in the previous examination. IMPRESSION: There is wall thickening in the gallbladder along with pericholecystic fluid. Findings suggest possible acute cholecystitis. Follow-up sonogram and surgical consultation should be considered. There is no evidence of intestinal obstruction or pneumoperitoneum. There is no hydronephrosis. There is a 4 mm nonobstructing right renal stone.  Fatty liver. Other findings as described in the body of the report. Electronically Signed   By: Elmer Picker M.D.   On: 09/01/2021 11:22   DG Chest Portable 1 View  Result Date: 09/01/2021 CLINICAL DATA:  Epigastric pain EXAM: PORTABLE CHEST 1 VIEW COMPARISON:  Previous studies including the examination of 08/19/2021 FINDINGS: Cardiac size is unremarkable. There are no signs of alveolar pulmonary edema or focal pulmonary consolidation. There is slight prominence of peribronchial interstitial markings with no significant interval change. There is no pleural effusion or pneumothorax. IMPRESSION: There are no new infiltrates or signs of pulmonary edema. Electronically Signed   By: Elmer Picker M.D.   On: 09/01/2021 08:56   US Abdomen Limited RUQ (LIVER/GB)  Result Date: 09/01/2021 CLINICAL DATA:  Epigastric pain, nausea, 2 days EXAM: ULTRASOUND ABDOMEN LIMITED RIGHT UPPER QUADRANT COMPARISON:  Same day CT abdomen/pelvis FINDINGS: Gallbladder: Calcified gallstones are seen measuring up to 2.1 cm. There is marked gallbladder wall thickening measuring up to 1.6 cm. There is  pericholecystic fluid, and a positive sonographic Murphy's sign. Common bile duct: Diameter: 6 mm. Liver: No focal lesion identified. Within normal limits in parenchymal echogenicity. Portal vein is patent on color Doppler imaging with normal direction of blood flow towards the liver. Other: None. IMPRESSION: Acute cholecystitis. Electronically Signed   By: Valetta Mole M.D.   On: 09/01/2021 13:18    Pending Labs [employer] (From admission, onward)     Start  Ordered   09/01/21 1019  Comprehensive metabolic panel  Once,   STAT        09/01/21 1019   09/01/21 1019  Lipase, blood  Once,   STAT        09/01/21 1019   09/01/21 0838  Comprehensive metabolic panel  (ED Abdominal Pain)  ONCE - STAT,   STAT        09/01/21 0838   09/01/21 0838  Lipase, blood  (ED Abdominal Pain)  ONCE - STAT,   STAT        09/01/21 0838            Vitals/Pain Today's Vitals   09/01/21 1145 09/01/21 1220 09/01/21 1221 09/01/21 1318  BP:  (!) 153/88    Pulse: 62 64 65 66  Resp: 17 (!) 21 16 17   Temp:      TempSrc:      SpO2: 100% 100% 100% 100%  Weight:      Height:      PainSc:        Isolation Precautions No active isolations  Medications Medications  metroNIDAZOLE (FLAGYL) IVPB 500 mg (500 mg Intravenous New Bag/Given 09/01/21 1333)  famotidine (PEPCID) IVPB 20 mg premix (0 mg Intravenous Stopped 09/01/21 1011)  alum & mag hydroxide-simeth (MAALOX/MYLANTA) 200-200-20 MG/5ML suspension 15 mL (15 mLs Oral Given 09/01/21 0925)  iohexol (OMNIPAQUE) 300 MG/ML solution 100 mL (100 mLs Intravenous Contrast Given 09/01/21 1059)  cefTRIAXone (ROCEPHIN) 1 g in sodium chloride 0.9 % 100 mL IVPB (0 g Intravenous Stopped 09/01/21 1253)    Mobility  Low fall risk   Focused Assessments    R Recommendations: See Admitting Provider Note  Report given to:   Additional Notes:

## 2021-09-01 NOTE — Assessment & Plan Note (Addendum)
S/p recent cardioversion on 11/14 Resume eliquis Continue metoprolol, flecainide - she's no longer taking dilt, d/c'd from med list

## 2021-09-02 ENCOUNTER — Encounter (HOSPITAL_COMMUNITY): Payer: Self-pay | Admitting: Family Medicine

## 2021-09-02 DIAGNOSIS — I480 Paroxysmal atrial fibrillation: Secondary | ICD-10-CM

## 2021-09-02 DIAGNOSIS — Z01810 Encounter for preprocedural cardiovascular examination: Secondary | ICD-10-CM

## 2021-09-02 LAB — COMPREHENSIVE METABOLIC PANEL
ALT: 154 U/L — ABNORMAL HIGH (ref 0–44)
AST: 68 U/L — ABNORMAL HIGH (ref 15–41)
Albumin: 4.4 g/dL (ref 3.5–5.0)
Alkaline Phosphatase: 104 U/L (ref 38–126)
Anion gap: 7 (ref 5–15)
BUN: 9 mg/dL (ref 8–23)
CO2: 26 mmol/L (ref 22–32)
Calcium: 9.1 mg/dL (ref 8.9–10.3)
Chloride: 99 mmol/L (ref 98–111)
Creatinine, Ser: 0.68 mg/dL (ref 0.44–1.00)
GFR, Estimated: >60 mL/min (ref >60)
Glucose, Bld: 101 mg/dL — ABNORMAL HIGH (ref 70–99)
Potassium: 4 mmol/L (ref 3.5–5.1)
Sodium: 132 mmol/L — ABNORMAL LOW (ref 135–145)
Total Bilirubin: 1.1 mg/dL (ref 0.3–1.2)
Total Protein: 7.1 g/dL (ref 6.5–8.1)

## 2021-09-02 LAB — CBC
HCT: 34.7 % — ABNORMAL LOW (ref 36.0–46.0)
Hemoglobin: 11.8 g/dL — ABNORMAL LOW (ref 12.0–15.0)
MCH: 33.6 pg (ref 26.0–34.0)
MCHC: 34 g/dL (ref 30.0–36.0)
MCV: 98.9 fL (ref 80.0–100.0)
Platelets: 175 10*3/uL (ref 150–400)
RBC: 3.51 MIL/uL — ABNORMAL LOW (ref 3.87–5.11)
RDW: 12.6 % (ref 11.5–15.5)
WBC: 6.5 10*3/uL (ref 4.0–10.5)
nRBC: 0 % (ref 0.0–0.2)

## 2021-09-02 LAB — HEPATITIS PANEL, ACUTE
HCV Ab: NONREACTIVE
Hep A IgM: NONREACTIVE
Hep B C IgM: NONREACTIVE
Hepatitis B Surface Ag: NONREACTIVE

## 2021-09-02 LAB — FERRITIN: Ferritin: 123 ng/mL (ref 11–307)

## 2021-09-02 LAB — APTT
aPTT: 62 seconds — ABNORMAL HIGH (ref 24–36)
aPTT: 144 seconds — ABNORMAL HIGH (ref 24–36)

## 2021-09-02 LAB — VITAMIN B12: Vitamin B-12: 466 pg/mL (ref 180–914)

## 2021-09-02 LAB — FOLATE: Folate: 42.7 ng/mL (ref >5.9)

## 2021-09-02 LAB — IRON AND TIBC
Iron: 62 ug/dL (ref 28–170)
Saturation Ratios: 16 % (ref 10.4–31.8)
TIBC: 384 ug/dL (ref 250–450)
UIBC: 322 ug/dL

## 2021-09-02 MED ORDER — HEPARIN (PORCINE) 25000 UT/250ML-% IV SOLN
1200.0000 [IU]/h | INTRAVENOUS | Status: AC
  Administered 2021-09-02: 1300 [IU]/h via INTRAVENOUS
  Filled 2021-09-02 (×2): qty 250

## 2021-09-02 MED ORDER — AMOXICILLIN-POT CLAVULANATE 875-125 MG PO TABS
1.0000 | ORAL_TABLET | Freq: Two times a day (BID) | ORAL | Status: DC
  Administered 2021-09-02 – 2021-09-03 (×2): 1 via ORAL
  Filled 2021-09-02 (×2): qty 1

## 2021-09-02 NOTE — Progress Notes (Signed)
ANTICOAGULATION CONSULT NOTE - follow up  Pharmacy Consult for Heparin Indication: atrial fibrillation  Allergies  Allergen Reactions   Epinephrine Base Other (See Comments)    Seriously increases heart rate   Aspirin Other (See Comments)    Can take the coated 325 mg. Plain asa 325 mg speeds up the heart.   Benadryl [Diphenhydramine]     Increased heart rate   Covid-19 Ad26 Vaccine(Janssen) Hives     hives 6 hrs after her COVID 19 2nd booster - resolved    Fish Allergy Hives    Was imitation crab meat and broke out in hives, had skin testing for lobster and showed allergic   Hylan G-F 20 Other (See Comments)    Very painful (knee injection)   Penicillins Other (See Comments)    Pt previously has been told not to take because of family history of reactions (water blisters). She took amoxicillin in 2012-2013 with no reaction   Tape Other (See Comments)    Blisters from adhesive tape    Patient Measurements: Height: 5\' 5"  (165.1 cm) Weight: 106.6 kg (235 lb) IBW/kg (Calculated) : 57 Heparin Dosing Weight: 81.9 kg  Vital Signs: Temp: 98 F (36.7 C) (11/19 0257) Temp Source: Oral (11/19 0257) BP: 138/71 (11/19 0257) Pulse Rate: 63 (11/19 0257)  Labs: Recent Labs    09/01/21 0838 09/01/21 1019 09/01/21 1851 09/02/21 0447  HGB 11.7*  --   --  11.8*  HCT 35.0*  --   --  34.7*  PLT 171  --   --  175  APTT  --   --  33 62*  LABPROT  --   --  13.1  --   INR  --   --  1.0  --   HEPARINUNFRC  --   --  >1.10*  --   CREATININE  --  0.77  --  0.68  TROPONINIHS 4 4  --   --      Estimated Creatinine Clearance: 74.8 mL/min (by C-G formula based on SCr of 0.68 mg/dL).   Medical History: Past Medical History:  Diagnosis Date   Acute bronchitis 09/11/2016   11/17 refractory   Acute cystitis without hematuria 05/17/2015   Acute kidney injury [facility])    Labs today   Acute right ankle pain 09/09/2018   Anticoagulant long-term use    Xarelto   Axillary pain    Bronchitis     Cellulitis    Cholecystitis    Chronic interstitial nephritis    CONJUNCTIVITIS, ACUTE 10/18/2010   Qualifier: Diagnosis of  By: Alain Marion MD, Evie Lacks    D-dimer, elevated    Depression    Eye inflammation    History of interstitial nephritis 2016   chronic   History of nuclear stress test 12/16/2014   Intermediate risk nuclear study w/ medium size moderate severity reversible defect in the basal and mid inferolateral and inferior wall (per dr cardiology note , dr [name] did not think this was consistent with ischemia)/  normal LV function and wall motion, ef 75%   History of septic shock 01/22/2015   in setting Group A Strep Cellulits erysipelas/chest wall induration with streptoccocus basterium-- Severe sepsis, DIC, Acute respiratory failure with pulmonary edema, Acute Kidney failure with chronic interstitial nephritis   Hypertension    Hyponatremia    Migraines    on Zoloft for migraines   Mild carotid artery disease ([place])    per duplex 08-30-2017 bilateral ICA 1-39%   OA (osteoarthritis)  rheumotologist-  dr [name]   both knees,  shoulders, ankles   OSA on CPAP     moderate obstructive sleep apnea with an AHI of 23.4/h and oxygen desaturations as low as 84%.  Now on CPAP at 12 cm H2O.   PAF (paroxysmal atrial fibrillation) [facility]) 2009   cardiologist-- dr [name]   Paroxysmal atrial flutter [facility])    a. dx 11/2017.   PSVT (paroxysmal supraventricular tachycardia) ([place])    Wears contact lenses     Medications:  Infusions:   cefTRIAXone (ROCEPHIN)  IV     heparin 1,250 Units/hr (09/01/21 2000)    Assessment: 57 yoF admitted on 11/18 with abdominal pain, [place] concerning for acute cholecystitsis.  PMH significant for Afib s/p recent cardioversion on 11/14 on apixaban.  Pharmacy is consulted to dose heparin IV while off apixaban for possible procedures.  PTA apixaban 5mg  BID.  LD 11/18 at 8am  Baseline HL > 1.1, Aptt 33 Admission CBC: Hgb low at 11.7, Plt WNL Note  LFTs acutely elevated.   SCr WNL  09/02/2021 aPTT 62 sub-therapeutic on 1250 units/hr Hgb 11.8, Plts WNL Per RN no bleeding or infusion issues  Goal of Therapy:  Heparin level 0.3-0.7 units/ml aPTT 66-102 seconds Monitor platelets by anticoagulation protocol: Yes   Plan:  Increase heparin drip to 1450 units/hr APTT in 8 hours  Daily APTT, heparin level, and CBC Follow up surgical plans   Dolly Rias RPh 09/02/2021, 5:19 AM

## 2021-09-02 NOTE — Progress Notes (Signed)
Held Heparin drip per pharmacy, will restart Heparin drip at 1700 per order.

## 2021-09-02 NOTE — Progress Notes (Signed)
ANTICOAGULATION CONSULT NOTE - follow up  Pharmacy Consult for Heparin Indication: atrial fibrillation  Allergies  Allergen Reactions   Epinephrine Base Other (See Comments)    Seriously increases heart rate   Aspirin Other (See Comments)    Can take the coated 325 mg. Plain asa 325 mg speeds up the heart.   Benadryl [Diphenhydramine]     Increased heart rate   Covid-19 Ad26 Vaccine(Janssen) Hives     hives 6 hrs after her COVID 19 2nd booster - resolved    Fish Allergy Hives    Was imitation crab meat and broke out in hives, had skin testing for lobster and showed allergic   Hylan G-F 20 Other (See Comments)    Very painful (knee injection)   Penicillins Other (See Comments)    Pt previously has been told not to take because of family history of reactions (water blisters). She took amoxicillin in 2012-2013 with no reaction   Tape Other (See Comments)    Blisters from adhesive tape    Patient Measurements: Height: 5\' 5"  (165.1 cm) Weight: 106.6 kg (235 lb) IBW/kg (Calculated) : 57 Heparin Dosing Weight: 81.9 kg  Vital Signs: Temp: 98.4 F (36.9 C) (11/19 1344) Temp Source: Oral (11/19 1344) BP: 154/62 (11/19 1344) Pulse Rate: 65 (11/19 1344)  Labs: Recent Labs    09/01/21 0838 09/01/21 1019 09/01/21 1851 09/02/21 0447 09/02/21 1341  HGB 11.7*  --   --  11.8*  --   HCT 35.0*  --   --  34.7*  --   PLT 171  --   --  175  --   APTT  --   --  33 62* 144*  LABPROT  --   --  13.1  --   --   INR  --   --  1.0  --   --   HEPARINUNFRC  --   --  >1.10*  --   --   CREATININE  --  0.77  --  0.68  --   TROPONINIHS 4 4  --   --   --      Estimated Creatinine Clearance: 74.8 mL/min (by C-G formula based on SCr of 0.68 mg/dL).   Medical History: Past Medical History:  Diagnosis Date   Acute bronchitis 09/11/2016   11/17 refractory   Acute cystitis without hematuria 05/17/2015   Acute kidney injury [facility])    Labs today   Acute right ankle pain 09/09/2018    Anticoagulant long-term use    Xarelto   Axillary pain    Bronchitis    Cellulitis    Cholecystitis    Chronic interstitial nephritis    CONJUNCTIVITIS, ACUTE 10/18/2010   Qualifier: Diagnosis of  By: Alain Marion MD, Evie Lacks    D-dimer, elevated    Depression    Eye inflammation    History of interstitial nephritis 2016   chronic   History of nuclear stress test 12/16/2014   Intermediate risk nuclear study w/ medium size moderate severity reversible defect in the basal and mid inferolateral and inferior wall (per dr cardiology note , dr [name] did not think this was consistent with ischemia)/  normal LV function and wall motion, ef 75%   History of septic shock 01/22/2015   in setting Group A Strep Cellulits erysipelas/chest wall induration with streptoccocus basterium-- Severe sepsis, DIC, Acute respiratory failure with pulmonary edema, Acute Kidney failure with chronic interstitial nephritis   Hypertension    Hyponatremia    Migraines  on Zoloft for migraines   Mild carotid artery disease ([place])    per duplex 08-30-2017 bilateral ICA 1-39%   OA (osteoarthritis) rheumotologist-  dr [name]   both knees,  shoulders, ankles   OSA on CPAP     moderate obstructive sleep apnea with an AHI of 23.4/h and oxygen desaturations as low as 84%.  Now on CPAP at 12 cm H2O.   PAF (paroxysmal atrial fibrillation) [facility]) 2009   cardiologist-- dr [name]   Paroxysmal atrial flutter [facility])    a. dx 11/2017.   PSVT (paroxysmal supraventricular tachycardia) ([place])    Wears contact lenses     Medications:  Infusions:   heparin 1,450 Units/hr (09/02/21 1246)    Assessment: 32 yoF admitted on 11/18 with abdominal pain, [place] concerning for acute cholecystitsis.  PMH significant for Afib s/p recent cardioversion on 11/14 on apixaban.  Pharmacy is consulted to dose heparin IV while off apixaban for possible procedures.  PTA apixaban 5mg  BID.  LD 11/18 at 8am  Baseline HL > 1.1, Aptt  33 Admission CBC: Hgb low at 11.7, Plt WNL Note LFTs acutely elevated.   SCr WNL  09/02/2021 aPTT 144 supra-therapeutic on 1450 units/hr - confirmed with RN that lab was drawn from opposite arm that heparin is infusing RN reports large bruise where lab was drawn, present since this AM Hgb 11.8, Plts WNL   Goal of Therapy:  Heparin level 0.3-0.7 units/ml aPTT 66-102 seconds Monitor platelets by anticoagulation protocol: Yes   Plan:  Hold infusion for one hour then decrease heparin drip to 1300 units/hr APTT in 8 hours  Daily APTT, heparin level, and CBC Follow up surgical plans   Peggyann Juba, PharmD, BCPS Pharmacy: [phone] Please utilize Amion for appropriate phone number to reach the unit pharmacist ([place]) 09/02/2021 3:48 PM

## 2021-09-02 NOTE — Consult Note (Signed)
Cardiology Consultation:   Patient ID: Amanda Wilkins MRN: [id number]; DOB: October 17, 1945  Admit date: 09/01/2021 Date of Consult: 09/02/2021  PCP:  [name], MD   [facility] Providers Cardiologist:  [name], MD   {  Patient Profile:    Amanda Wilkins is a 75 y.o. female with a hx of PAFand hypertension  who is being seen 09/02/2021 for the evaluation of atrial fibrillation and preoperative evaluation prior to cholecystectomy at the request of Fayrene Helper MD.   History of Present Illness:   Most recent echocardiogram February 2021 showed normal LV function, mild left ventricular hypertrophy, moderate pulmonary hypertension, mild mitral regurgitation.  CPX August 2021 suggestive of limitation primarily due to her body habitus but also elevated pulmonary pressures with exercise.  Monitor October 2022 showed sinus rhythm with frequent episodes of SVT longest lasting 1 minute 39 seconds.  Patient has a history of paroxysmal atrial fibrillation.  She underwent cardioversion of atrial fibrillation on August 28, 2021.  She has been admitted with cholecystitis and cardiology asked to evaluate prior to surgery.  Note when she has atrial fibrillation her predominant symptom is fatigue.  If she is not in atrial fibrillation she has dyspnea with more vigorous activities but not routine activities.  No orthopnea, PND, pedal edema, exertional chest pain or syncope.  Cardiology now asked to evaluate  ROS:  Please see the history of present illness.  Recent abdominal pain and arthralgias. All other ROS reviewed and negative.    Past Medical History:  Diagnosis Date   Acute bronchitis 09/11/2016    11/17 refractory   Acute cystitis without hematuria 05/17/2015   Acute kidney injury [facility])      Labs today   Acute right ankle pain 09/09/2018   Anticoagulant long-term use      Xarelto   Axillary pain     Bronchitis     Cellulitis     Cholecystitis     Chronic  interstitial nephritis     CONJUNCTIVITIS, ACUTE 10/18/2010    Qualifier: Diagnosis of  By: Alain Marion MD, Evie Lacks    D-dimer, elevated     Depression     Eye inflammation     History of interstitial nephritis 2016    chronic   History of nuclear stress test 12/16/2014    Intermediate risk nuclear study w/ medium size moderate severity reversible defect in the basal and mid inferolateral and inferior wall (per dr cardiology note , dr [name] did not think this was consistent with ischemia)/  normal LV function and wall motion, ef 75%   History of septic shock 01/22/2015    in setting Group A Strep Cellulits erysipelas/chest wall induration with streptoccocus basterium-- Severe sepsis, DIC, Acute respiratory failure with pulmonary edema, Acute Kidney failure with chronic interstitial nephritis   Hypertension     Hyponatremia     Migraines      on Zoloft for migraines   Mild carotid artery disease ([place])      per duplex 08-30-2017 bilateral ICA 1-39%   OA (osteoarthritis) rheumotologist-  dr [name]    both knees,  shoulders, ankles   OSA on CPAP       moderate obstructive sleep apnea with an AHI of 23.4/h and oxygen desaturations as low as 84%.  Now on CPAP at 12 cm H2O.   PAF (paroxysmal atrial fibrillation) [facility]) 2009    cardiologist-- dr [name]   Paroxysmal atrial flutter [facility])      a.  dx 11/2017.   PSVT (paroxysmal supraventricular tachycardia) ([place])     Wears contact lenses               Past Surgical History:  Procedure Laterality Date   BREAST BIOPSY Right 05/15/2017    [place]   CARDIOVERSION N/A 08/28/2021    Procedure: CARDIOVERSION;  Surgeon: Fay Records, MD;  Location: [facility] ENDOSCOPY;  Service: Cardiovascular;  Laterality: N/A;   [place]   ESOPHAGOGASTRODUODENOSCOPY N/A 02/08/2015    Procedure: ESOPHAGOGASTRODUODENOSCOPY (EGD);  Surgeon: [name], MD;  Location: [place];  Service: Endoscopy;  Laterality: N/A;   KNEE ARTHROSCOPY W/  MENISCAL REPAIR Left 08/2014    @[facility]    SHOULDER SURGERY Right 04/04/2016   TOTAL KNEE ARTHROPLASTY Right 09/01/2018    Procedure: RIGHT TOTAL KNEE ARTHROPLASTY;  Surgeon: [name], MD;  Location: [facility] ORS;  Service: Orthopedics;  Laterality: Right;      Inpatient Medications: Scheduled Meds:  flecainide  75 mg Oral BID   hydroxychloroquine  400 mg Oral q morning   metoprolol tartrate  25 mg Oral BID   metroNIDAZOLE  500 mg Oral Q12H   sertraline  50 mg Oral Daily    Continuous Infusions:  cefTRIAXone (ROCEPHIN)  IV     heparin 1,450 Units/hr (09/02/21 0531)    PRN Meds: ALPRAZolam, ondansetron **OR** ondansetron (ZOFRAN) IV, oxyCODONE, polyethylene glycol   Allergies:         Allergies  Allergen Reactions   Epinephrine Base Other (See Comments)      Seriously increases heart rate   Aspirin Other (See Comments)      Can take the coated 325 mg. Plain asa 325 mg speeds up the heart.   Benadryl [Diphenhydramine]        Increased heart rate   Covid-19 Ad26 Vaccine(Janssen) Hives       hives 6 hrs after her COVID 19 2nd booster - resolved     Fish Allergy Hives      Was imitation crab meat and broke out in hives, had skin testing for lobster and showed allergic   Hylan G-F 20 Other (See Comments)      Very painful (knee injection)   Penicillins Other (See Comments)      Pt previously has been told not to take because of family history of reactions (water blisters). 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)  Tobacco Use   Smoking status: Never   Smokeless tobacco:  Never   Tobacco comments:      H/O social smoking at times when drinking--never a "habit" (in the 1970s)  Vaping Use   Vaping Use: Never used  Substance and Sexual Activity   Alcohol use: Yes      Alcohol/week: 0.0 standard drinks      Comment: Occasional   Drug use: Never   Sexual activity: Yes      Partners: Male  Birth control/protection: Post-menopausal      Comment: intercourse age 78, sexual partners more than 5  Other Topics Concern   Not on file  Social History Narrative   Not on file    Social Determinants of Health       Financial Resource Strain: Low Risk    Difficulty of Paying Living Expenses: Not hard at all  Food Insecurity: No Food Insecurity   Worried About Charity fundraiser in the Last Year: Never true   Arboriculturist in the Last Year: Never true  Transportation Needs: No Transportation Needs   Lack of Transportation (Medical): No   Lack of Transportation (Non-Medical): No  Physical Activity: Sufficiently Active   Days of Exercise per Week: 5 days   Minutes of Exercise per Session: 30 min  Stress: No Stress Concern Present   Feeling of Stress : Not at all  Social Connections: Moderately Isolated   Frequency of Communication with Friends and Family: More than three times a week   Frequency of Social Gatherings with Friends and Family: More than three times a week   Attends Religious Services: Never   Marine scientist or Organizations: No   Attends Music therapist: Never   Marital Status: Married  Human resources officer Violence: Not At Risk   Fear of Current or Ex-Partner: No   Emotionally Abused: No   Physically Abused: No   Sexually Abused: No    Family History:          Family History  Problem Relation Age of Onset   Pancreatic cancer Brother     Lymphoma Mother     Prostate cancer Father          metastatic prostate cancer   Breast cancer Sister 15   Allergic rhinitis Sister     Urticaria Sister     Asthma Neg Hx      Eczema Neg Hx     Immunodeficiency Neg Hx     Atopy Neg Hx     Angioedema Neg Hx        ROS:  Please see the history of present illness.  Recent abdominal pain and arthralgias. All other ROS reviewed and negative.      Physical Exam/Data:          Vitals:    09/01/21 1630 09/01/21 1800 09/02/21 0116 09/02/21 0257  BP: (!) 162/89 (!) 162/88 (!) 148/72 138/71  Pulse: 69 71 62 63  Resp: 19 17 18 20   Temp:   97.7 F (36.5 C) 97.7 F (36.5 C) 98 F (36.7 C)  TempSrc:   Axillary Oral Oral  SpO2: 100% 100% 95% 100%  Weight:          Height:              Intake/Output Summary (Last 24 hours) at 09/02/2021 0914 Last data filed at 09/02/2021 0304    Gross per 24 hour  Intake 295.61 ml  Output 1500 ml  Net -1204.39 ml    Last 3 Weights 09/01/2021 08/28/2021 08/22/2021  Weight (lbs) 235 lb 235 lb 236 lb 14.4 oz  Weight (kg) 106.595 kg 106.595 kg 107.457 kg     Body mass index is 39.11 kg/m.  General:  Well nourished, obese, in no acute distress HEENT: normal Neck: no JVD Vascular: No carotid bruits; Distal pulses 2+ bilaterally Cardiac:  normal S1, S2; RRR; no murmur  Lungs:  clear to auscultation bilaterally, no wheezing, rhonchi or rales  Abd: soft, nontender, no hepatomegaly  Ext: no edema Musculoskeletal:  No deformities, BUE and BLE strength normal and equal Skin: warm and dry  Neuro:  CNs 2-12 intact, no focal abnormalities noted Psych:  Normal affect    EKG:  The EKG was personally reviewed and demonstrates: Normal sinus rhythm with first-degree AV block and no ST changes. Telemetry:  Telemetry was personally reviewed and demonstrates: Sinus rhythm with brief episode of PAT.   Laboratory Data:   High Sensitivity Troponin:   Last Labs         Recent Labs  Lab 08/19/21 2134 08/20/21 0755 09/01/21 0838 09/01/21 1019  TROPONINIHS 6 6 4 4        Chemistry Last Labs       Recent Labs  Lab 09/01/21 1019 09/02/21 0447  NA 135 132*  K 4.5 4.0  CL  101 99  CO2 22 26  GLUCOSE 101* 101*  BUN 13 9  CREATININE 0.77 0.68  CALCIUM 9.8 9.1  GFRNONAA >60 >60  ANIONGAP 12 7      Last Labs       Recent Labs  Lab 09/01/21 1019 09/02/21 0447  PROT 7.4 7.1  ALBUMIN 4.6 4.4  AST 105* 68*  ALT 192* 154*  ALKPHOS 100 104  BILITOT 0.6 1.1      Hematology Last Labs       Recent Labs  Lab 09/01/21 0838 09/02/21 0447  WBC 5.4 6.5  RBC 3.54* 3.51*  HGB 11.7* 11.8*  HCT 35.0* 34.7*  MCV 98.9 98.9  MCH 33.1 33.6  MCHC 33.4 34.0  RDW 12.6 12.6  PLT 171 175          Radiology/Studies:  CT ABDOMEN PELVIS W CONTRAST   Result Date: 09/01/2021 CLINICAL DATA:  Abdominal pain EXAM: CT ABDOMEN AND PELVIS WITH CONTRAST TECHNIQUE: Multidetector CT imaging of the abdomen and pelvis was performed using the standard protocol following bolus administration of intravenous contrast. CONTRAST:  142mL OMNIPAQUE IOHEXOL 300 MG/ML  SOLN COMPARISON:  05/20/2020 FINDINGS: Lower chest: Possible minimal scarring is seen in the periphery of lower lung fields. Hepatobiliary: There is fatty infiltration in the liver. There is mild diffuse wall thickening in the gallbladder. There is pericholecystic fluid. There is no dilation of bile ducts. Pancreas: No focal abnormality is seen. Spleen: Unremarkable. Adrenals/Urinary Tract: Adrenals are not enlarged. There is no hydronephrosis. There is malrotation in the right kidney. There is 4 mm calculus in the lower pole of right kidney. Ureters are unremarkable. Urinary bladder is unremarkable. Stomach/Bowelstomach is not distended. Small bowel loops are not dilated. Appendix is not dilated. There is no significant wall thickening in the colon. Scattered diverticula are seen in colon without signs of focal acute diverticulitis. Vascular/Lymphatic: Unremarkable. Reproductive: Unremarkable. Other: There is no ascites or pneumoperitoneum. Small umbilical hernia containing fat is seen. Musculoskeletal: Degenerative changes  noted in the lumbar spine, particularly severe at L2-L3, L3-L4 and L5-S1 levels. Degenerative changes are also noted in the lower thoracic spine. Similar findings were seen in the previous examination. IMPRESSION: There is wall thickening in the gallbladder along with pericholecystic fluid. Findings suggest possible acute cholecystitis. Follow-up sonogram and surgical consultation should be considered. There is no evidence of intestinal obstruction or pneumoperitoneum. There is no hydronephrosis. There is a 4 mm nonobstructing right renal stone.  Fatty liver. Other findings as described in the body of the report. Electronically Signed   By: Elmer Picker M.D.   On: 09/01/2021 11:22  DG Chest Portable 1 View   Result Date: 09/01/2021 CLINICAL DATA:  Epigastric pain EXAM: PORTABLE CHEST 1 VIEW COMPARISON:  Previous studies including the examination of 08/19/2021 FINDINGS: Cardiac size is unremarkable. There are no signs of alveolar pulmonary edema or focal pulmonary consolidation. There is slight prominence of peribronchial interstitial markings with no significant interval change. There is no pleural effusion or pneumothorax. IMPRESSION: There are no new infiltrates or signs of pulmonary edema. Electronically Signed   By: Elmer Picker M.D.   On: 09/01/2021 08:56    US Abdomen Limited RUQ (LIVER/GB)   Result Date: 09/01/2021 CLINICAL DATA:  Epigastric pain, nausea, 2 days EXAM: ULTRASOUND ABDOMEN LIMITED RIGHT UPPER QUADRANT COMPARISON:  Same day CT abdomen/pelvis FINDINGS: Gallbladder: Calcified gallstones are seen measuring up to 2.1 cm. There is marked gallbladder wall thickening measuring up to 1.6 cm. There is pericholecystic fluid, and a positive sonographic Murphy's sign. Common bile duct: Diameter: 6 mm. Liver: No focal lesion identified. Within normal limits in parenchymal echogenicity. Portal vein is patent on color Doppler imaging with normal direction of blood flow towards the  liver. Other: None. IMPRESSION: Acute cholecystitis. Electronically Signed   By: Valetta Mole M.D.   On: 09/01/2021 13:18    Assessment and Plan:    Preoperative evaluation prior to cholecystectomy-patient had recent cardioversion on August 28, 2021.  She remains in sinus rhythm today.  Ideally would not interrupt anticoagulation for 4 weeks following cardioversion.  Otherwise risk of embolic event is higher.  If absolutely necessary then could hold IV heparin as needed for surgery and resume when able.  However risk of stroke would be higher.  Patient would prefer to be conservative and not risk of embolic event.  Therefore once it is clear she will not require procedures would change IV heparin back to apixaban.  Could proceed with cholecystectomy in approximately 4 weeks. CHADSvasc 4.  Paroxysmal atrial fibrillation-patient remains in sinus rhythm.  Would continue flecainide at preadmission dose.  Continue metoprolol.  Apixaban on hold for possible procedure.  Continue IV heparin.  Once it is clear she will not require percutaneous drain would discontinue IV heparin and resume apixaban.  Acute cholecystitis-continue antibiotics.  May need percutaneous drain.  Management per general surgery.  Hypertension-continue metoprolol.  Follow blood pressure and adjust medications as indicated.    [facility] will sign off.   Medication Recommendations: Medication recommendations as outlined above. Other recommendations (labs, testing, etc): No other testing from a cardiac standpoint. Follow up as an outpatient: Dr. [name] in approximately 4 to 6 weeks.  For questions or updates, please contact [place] Please consult [email or web address] for contact info under    Signed, Kirk Ruths, MD  09/02/2021 9:14 AM

## 2021-09-02 NOTE — Progress Notes (Signed)
PROGRESS NOTE    Amanda Wilkins  ZOX:[id number] DOB: [birth year] DOA: 09/01/2021 PCP: Tresa Garter, MD   Chief Complaint  Patient presents with   Abdominal Pain    Brief Narrative:   Amanda Wilkins is Amanda Wilkins 75 y.o. female with medical history significant of atrial fib on eliquis, s/p recent cardioversion on 11/14, HTN, HLD, anxiety/depression, RA, OSA on cpap presenting with 2 days of RUQ abdominal pain.    Assessment & Plan:   Principal Problem:   Acute cholecystitis Active Problems:   Cholecystitis   Elevated LFTs   AF (paroxysmal atrial fibrillation) (HCC)   Anemia   RA (rheumatoid arthritis) (HCC)   Adjustment disorder with mixed anxiety and depressed mood   HLD (hyperlipidemia)   Renal stone  * Acute cholecystitis 2 days RUQ abdominal pain CT abdomen/pelvis with wall thickening in gallbladder and pericholecystic fluid, concerning for acute cholecystitis RUQ [place] concerning for acute cholecystitsis  Surgery c/s - recommending cardiology c/s, clears for now, continuing IV abx Cardiology c/s, appreciate recs - pt prefers to be conservative, currently following on IV heparin and IV abx, may need perc drain, but will follow  Surgery aware - recommending cardiac eval for risk stratification, hold eliquis, clear liquid diet for now Will continue abx (pcn allergy noted, tolerated amox previously)  Elevated LFTs ? related to cholecystitis Normal bili and alk phos Follow acute hepatitis panel Hold crestor improving  AF (paroxysmal atrial fibrillation) (HCC) S/p recent cardioversion on 11/14 Holding eliquis at this time Start eliquis, minimize time off anticoagulation given recent cardioversion Continue metoprolol, flecainide - she's no longer taking dilt, d/c'd from med list  Anemia Follow Anemia w/u - c/w AOCD  RA (rheumatoid arthritis) (HCC) Continue plaquenil   Renal stone 4 mm nonobstructing right renal stone Follow outpatient  HLD  (hyperlipidemia) crestor on hold with LFTs  Adjustment disorder with mixed anxiety and depressed mood Zoloft, xanax     DVT prophylaxis: heparin Code Status: full Family Communication: husband at bedside Disposition:   Status is: Inpatient  Remains inpatient appropriate because: need for IV abx       Consultants:  Surgery cards  Procedures:  none  Antimicrobials:  Anti-infectives (From admission, onward)    Start     Dose/Rate Route Frequency Ordered Stop   09/02/21 1200  cefTRIAXone (ROCEPHIN) 2 g in sodium chloride 0.9 % 100 mL IVPB        2 g 200 mL/hr over 30 Minutes Intravenous Every 24 hours 09/01/21 1744     09/02/21 1000  hydroxychloroquine (PLAQUENIL) tablet 400 mg        400 mg Oral Every morning 09/01/21 1802     09/01/21 2200  metroNIDAZOLE (FLAGYL) tablet 500 mg        500 mg Oral Every 12 hours 09/01/21 1744     09/01/21 1145  cefTRIAXone (ROCEPHIN) 1 g in sodium chloride 0.9 % 100 mL IVPB        1 g 200 mL/hr over 30 Minutes Intravenous  Once 09/01/21 1144 09/01/21 1253   09/01/21 1145  metroNIDAZOLE (FLAGYL) IVPB 500 mg        500 mg 100 mL/hr over 60 Minutes Intravenous  Once 09/01/21 1144 09/01/21 1433          Subjective: Wants to avoid procedures at this time  Objective: Vitals:   09/01/21 1800 09/02/21 0116 09/02/21 0257 09/02/21 1344  BP: (!) 162/88 (!) 148/72 138/71 (!) 154/62  Pulse: 71 62 63 65  Resp: 17  18 20 18   Temp: 97.7 F (36.5 C) 97.7 F (36.5 C) 98 F (36.7 C) 98.4 F (36.9 C)  TempSrc: Axillary Oral Oral Oral  SpO2: 100% 95% 100% 100%  Weight:      Height:        Intake/Output Summary (Last 24 hours) at 09/02/2021 1443 Last data filed at 09/02/2021 1300 Gross per 24 hour  Intake 327.39 ml  Output 1800 ml  Net -1472.61 ml   Filed Weights   09/01/21 0825  Weight: 106.6 kg    Examination:  General: No acute distress. Cardiovascular: RRR Lungs: unlabored Abdomen: Soft, nontender,  nondistended Neurological: Alert and oriented 3. Moves all extremities 4 . Cranial nerves II through XII grossly intact. Skin: Warm and dry. No rashes or lesions. Extremities: No clubbing or cyanosis. No edema.   Data Reviewed: I have personally reviewed following labs and imaging studies  CBC: Recent Labs  Lab 09/01/21 0838 09/02/21 0447  WBC 5.4 6.5  NEUTROABS 3.8  --   HGB 11.7* 11.8*  HCT 35.0* 34.7*  MCV 98.9 98.9  PLT 171 175    Basic Metabolic Panel: Recent Labs  Lab 09/01/21 1019 09/02/21 0447  NA 135 132*  K 4.5 4.0  CL 101 99  CO2 22 26  GLUCOSE 101* 101*  BUN 13 9  CREATININE 0.77 0.68  CALCIUM 9.8 9.1    GFR: Estimated Creatinine Clearance: 74.8 mL/min (by C-G formula based on SCr of 0.68 mg/dL).  Liver Function Tests: Recent Labs  Lab 09/01/21 1019 09/02/21 0447  AST 105* 68*  ALT 192* 154*  ALKPHOS 100 104  BILITOT 0.6 1.1  PROT 7.4 7.1  ALBUMIN 4.6 4.4    CBG: No results for input(s): GLUCAP in the last 168 hours.   Recent Results (from the past 240 hour(s))  Resp Panel by RT-PCR (Flu Tyress Loden&B, Covid) Nasopharyngeal Swab     Status: None   Collection Time: 09/01/21  8:39 AM   Specimen: Nasopharyngeal Swab; Nasopharyngeal(NP) swabs in vial transport medium  Result Value Ref Range Status   SARS Coronavirus 2 by RT PCR NEGATIVE NEGATIVE Final    Comment: (NOTE) SARS-CoV-2 target nucleic acids are NOT DETECTED.  The SARS-CoV-2 RNA is generally detectable in upper respiratory specimens during the acute phase of infection. The lowest concentration of SARS-CoV-2 viral copies this assay can detect is 138 copies/mL. Ostin Mathey negative result does not preclude SARS-Cov-2 infection and should not be used as the sole basis for treatment or other patient management decisions. Alohilani Levenhagen negative result may occur with  improper specimen collection/handling, submission of specimen other than nasopharyngeal swab, presence of viral mutation(s) within the areas  targeted by this assay, and inadequate number of viral copies(<138 copies/mL). Rony Ratz negative result must be combined with clinical observations, patient history, and epidemiological information. The expected result is Negative.  Fact Sheet for Patients:  BloggerCourse.com  Fact Sheet for Healthcare Providers:  SeriousBroker.it  This test is no t yet approved or cleared by the [place] FDA and  has been authorized for detection and/or diagnosis of SARS-CoV-2 by FDA under an Emergency Use Authorization (EUA). This EUA will remain  in effect (meaning this test can be used) for the duration of the COVID-19 declaration under Section 564(b)(1) of the Act, 21 U.S.C.section 360bbb-3(b)(1), unless the authorization is terminated  or revoked sooner.       Influenza Sargun Rummell by PCR NEGATIVE NEGATIVE Final   Influenza B by PCR NEGATIVE NEGATIVE Final    Comment: (NOTE)  The Xpert Xpress SARS-CoV-2/FLU/RSV plus assay is intended as an aid in the diagnosis of influenza from Nasopharyngeal swab specimens and should not be used as Addisynn Vassell sole basis for treatment. Nasal washings and aspirates are unacceptable for Xpert Xpress SARS-CoV-2/FLU/RSV testing.  Fact Sheet for Patients: BloggerCourse.com  Fact Sheet for Healthcare Providers: SeriousBroker.it  This test is not yet approved or cleared by the [place] FDA and has been authorized for detection and/or diagnosis of SARS-CoV-2 by FDA under an Emergency Use Authorization (EUA). This EUA will remain in effect (meaning this test can be used) for the duration of the COVID-19 declaration under Section 564(b)(1) of the Act, 21 U.S.C. section 360bbb-3(b)(1), unless the authorization is terminated or revoked.  Performed at [facility], [address] [place], [place] [address]          Radiology Studies: CT ABDOMEN PELVIS W  CONTRAST  Result Date: 09/01/2021 CLINICAL DATA:  Abdominal pain EXAM: CT ABDOMEN AND PELVIS WITH CONTRAST TECHNIQUE: Multidetector CT imaging of the abdomen and pelvis was performed using the standard protocol following bolus administration of intravenous contrast. CONTRAST:  OMNIPAQUE IOHEXOL 300 MG/ML  SOLN COMPARISON:  05/20/2020 FINDINGS: Lower chest: Possible minimal scarring is seen in the periphery of lower lung fields. Hepatobiliary: There is fatty infiltration in the liver. There is mild diffuse wall thickening in the gallbladder. There is pericholecystic fluid. There is no dilation of bile ducts. Pancreas: No focal abnormality is seen. Spleen: Unremarkable. Adrenals/Urinary Tract: Adrenals are not enlarged. There is no hydronephrosis. There is malrotation in the right kidney. There is 4 mm calculus in the lower pole of right kidney. Ureters are unremarkable. Urinary bladder is unremarkable. Stomach/Bowelstomach is not distended. Small bowel loops are not dilated. Appendix is not dilated. There is no significant wall thickening in the colon. Scattered diverticula are seen in colon without signs of focal acute diverticulitis. Vascular/Lymphatic: Unremarkable. Reproductive: Unremarkable. Other: There is no ascites or pneumoperitoneum. Small umbilical hernia containing fat is seen. Musculoskeletal: Degenerative changes noted in the lumbar spine, particularly severe at L2-L3, L3-L4 and L5-S1 levels. Degenerative changes are also noted in the lower thoracic spine. Similar findings were seen in the previous examination. IMPRESSION: There is wall thickening in the gallbladder along with pericholecystic fluid. Findings suggest possible acute cholecystitis. Follow-up sonogram and surgical consultation should be considered. There is no evidence of intestinal obstruction or pneumoperitoneum. There is no hydronephrosis. There is Eldrick Penick 4 mm nonobstructing right renal stone.  Fatty liver. Other findings as  described in the body of the report. Electronically Signed   By: Ernie Avena M.D.   On: 09/01/2021 11:22   DG Chest Portable 1 View  Result Date: 09/01/2021 CLINICAL DATA:  Epigastric pain EXAM: PORTABLE CHEST 1 VIEW COMPARISON:  Previous studies including the examination of 08/19/2021 FINDINGS: Cardiac size is unremarkable. There are no signs of alveolar pulmonary edema or focal pulmonary consolidation. There is slight prominence of peribronchial interstitial markings with no significant interval change. There is no pleural effusion or pneumothorax. IMPRESSION: There are no new infiltrates or signs of pulmonary edema. Electronically Signed   By: Ernie Avena M.D.   On: 09/01/2021 08:56   US Abdomen Limited RUQ (LIVER/GB)  Result Date: 09/01/2021 CLINICAL DATA:  Epigastric pain, nausea, 2 days EXAM: ULTRASOUND ABDOMEN LIMITED RIGHT UPPER QUADRANT COMPARISON:  Same day CT abdomen/pelvis FINDINGS: Gallbladder: Calcified gallstones are seen measuring up to 2.1 cm. There is marked gallbladder wall thickening measuring up to 1.6 cm. There is pericholecystic  fluid, and Teairra Millar positive sonographic Murphy's sign. Common bile duct: Diameter: 6 mm. Liver: No focal lesion identified. Within normal limits in parenchymal echogenicity. Portal vein is patent on color Doppler imaging with normal direction of blood flow towards the liver. Other: None. IMPRESSION: Acute cholecystitis. Electronically Signed   By: Lesia Hausen M.D.   On: 09/01/2021 13:18        Scheduled Meds:  flecainide  75 mg Oral BID   hydroxychloroquine  400 mg Oral q morning   metoprolol tartrate  25 mg Oral BID   metroNIDAZOLE  500 mg Oral Q12H   sertraline  50 mg Oral Daily   Continuous Infusions:  cefTRIAXone (ROCEPHIN)  IV     heparin 1,450 Units/hr (09/02/21 1246)     LOS: 1 day    Time spent: over 30 min    Lacretia Nicks, MD [facility]   To contact the attending provider between 7A-7P or the  covering provider during after hours 7P-7A, please log into the web site [email or web address] and access using universal [place] password for that web site. If you do not have the password, please call the hospital operator.  09/02/2021, 2:43 PM

## 2021-09-02 NOTE — Progress Notes (Signed)
Subjective/Chief Complaint: Pt with pain controlled.  Tol clears   Objective: Vital signs in last 24 hours: Temp:  [97.7 F (36.5 C)-98 F (36.7 C)] 98 F (36.7 C) (11/19 0257) Pulse Rate:  [61-71] 63 (11/19 0257) Resp:  [16-22] 20 (11/19 0257) BP: (130-163)/(57-89) 138/71 (11/19 0257) SpO2:  [95 %-100 %] 100 % (11/19 0257) Last BM Date: 09/01/21  Intake/Output from previous day: 11/18 0701 - 11/19 0700 In: 295.6 [I.V.:87.4; IV Piggyback:208.2] Out: 1500 [Urine:1500] Intake/Output this shift: No intake/output data recorded.  PE:  Constitutional: No acute distress, conversant, appears states age. Eyes: Anicteric sclerae, moist conjunctiva, no lid lag Lungs: Clear to auscultation bilaterally, normal respiratory effort CV: regular rate and rhythm, no murmurs, no peripheral edema, pedal pulses 2+ GI: Soft, no masses or hepatosplenomegaly, tender to palpation in epigastrum, no rebound/guarding Skin: No rashes, palpation reveals normal turgor Psychiatric: appropriate judgment and insight, oriented to person, place, and time   Lab Results:  Recent Labs    09/01/21 0838 09/02/21 0447  WBC 5.4 6.5  HGB 11.7* 11.8*  HCT 35.0* 34.7*  PLT 171 175   BMET Recent Labs    09/01/21 1019 09/02/21 0447  NA 135 132*  K 4.5 4.0  CL 101 99  CO2 22 26  GLUCOSE 101* 101*  BUN 13 9  CREATININE 0.77 0.68  CALCIUM 9.8 9.1   PT/INR Recent Labs    09/01/21 1851  LABPROT 13.1  INR 1.0   ABG No results for input(s): PHART, HCO3 in the last 72 hours.  Invalid input(s): PCO2, PO2  Studies/Results: CT ABDOMEN PELVIS W CONTRAST  Result Date: 09/01/2021 CLINICAL DATA:  Abdominal pain EXAM: CT ABDOMEN AND PELVIS WITH CONTRAST TECHNIQUE: Multidetector CT imaging of the abdomen and pelvis was performed using the standard protocol following bolus administration of intravenous contrast. CONTRAST:  154mL OMNIPAQUE IOHEXOL 300 MG/ML  SOLN COMPARISON:  05/20/2020 FINDINGS: Lower  chest: Possible minimal scarring is seen in the periphery of lower lung fields. Hepatobiliary: There is fatty infiltration in the liver. There is mild diffuse wall thickening in the gallbladder. There is pericholecystic fluid. There is no dilation of bile ducts. Pancreas: No focal abnormality is seen. Spleen: Unremarkable. Adrenals/Urinary Tract: Adrenals are not enlarged. There is no hydronephrosis. There is malrotation in the right kidney. There is 4 mm calculus in the lower pole of right kidney. Ureters are unremarkable. Urinary bladder is unremarkable. Stomach/Bowelstomach is not distended. Small bowel loops are not dilated. Appendix is not dilated. There is no significant wall thickening in the colon. Scattered diverticula are seen in colon without signs of focal acute diverticulitis. Vascular/Lymphatic: Unremarkable. Reproductive: Unremarkable. Other: There is no ascites or pneumoperitoneum. Small umbilical hernia containing fat is seen. Musculoskeletal: Degenerative changes noted in the lumbar spine, particularly severe at L2-L3, L3-L4 and L5-S1 levels. Degenerative changes are also noted in the lower thoracic spine. Similar findings were seen in the previous examination. IMPRESSION: There is wall thickening in the gallbladder along with pericholecystic fluid. Findings suggest possible acute cholecystitis. Follow-up sonogram and surgical consultation should be considered. There is no evidence of intestinal obstruction or pneumoperitoneum. There is no hydronephrosis. There is a 4 mm nonobstructing right renal stone.  Fatty liver. Other findings as described in the body of the report. Electronically Signed   By: Elmer Picker M.D.   On: 09/01/2021 11:22   DG Chest Portable 1 View  Result Date: 09/01/2021 CLINICAL DATA:  Epigastric pain EXAM: PORTABLE CHEST 1 VIEW COMPARISON:  Previous studies  including the examination of 08/19/2021 FINDINGS: Cardiac size is unremarkable. There are no signs of  alveolar pulmonary edema or focal pulmonary consolidation. There is slight prominence of peribronchial interstitial markings with no significant interval change. There is no pleural effusion or pneumothorax. IMPRESSION: There are no new infiltrates or signs of pulmonary edema. Electronically Signed   By: Elmer Picker M.D.   On: 09/01/2021 08:56   US Abdomen Limited RUQ (LIVER/GB)  Result Date: 09/01/2021 CLINICAL DATA:  Epigastric pain, nausea, 2 days EXAM: ULTRASOUND ABDOMEN LIMITED RIGHT UPPER QUADRANT COMPARISON:  Same day CT abdomen/pelvis FINDINGS: Gallbladder: Calcified gallstones are seen measuring up to 2.1 cm. There is marked gallbladder wall thickening measuring up to 1.6 cm. There is pericholecystic fluid, and a positive sonographic Murphy's sign. Common bile duct: Diameter: 6 mm. Liver: No focal lesion identified. Within normal limits in parenchymal echogenicity. Portal vein is patent on color Doppler imaging with normal direction of blood flow towards the liver. Other: None. IMPRESSION: Acute cholecystitis. Electronically Signed   By: Valetta Mole M.D.   On: 09/01/2021 13:18    Anti-infectives: Anti-infectives (From admission, onward)    Start     Dose/Rate Route Frequency Ordered Stop   09/02/21 1200  cefTRIAXone (ROCEPHIN) 2 g in sodium chloride 0.9 % 100 mL IVPB        2 g 200 mL/hr over 30 Minutes Intravenous Every 24 hours 09/01/21 1744     09/02/21 1000  hydroxychloroquine (PLAQUENIL) tablet 400 mg        400 mg Oral Every morning 09/01/21 1802     09/01/21 2200  metroNIDAZOLE (FLAGYL) tablet 500 mg        500 mg Oral Every 12 hours 09/01/21 1744     09/01/21 1145  cefTRIAXone (ROCEPHIN) 1 g in sodium chloride 0.9 % 100 mL IVPB        1 g 200 mL/hr over 30 Minutes Intravenous  Once 09/01/21 1144 09/01/21 1253   09/01/21 1145  metroNIDAZOLE (FLAGYL) IVPB 500 mg        500 mg 100 mL/hr over 60 Minutes Intravenous  Once 09/01/21 1144 09/01/21 1433        Assessment/Plan: Acute cholecystitis -Will await Cards input -OK for clears fo now    FEN - may have CLD from our standpoint VTE - hold eliquis, may do iv heparin ID - Rocephin/Flagyl   HTN A fib, off eliquis, heparin gtt - cards eval for risk stratification HLD Depression/Anxiety RA OSA, on CPAP CAD Asthma  LOS: 1 day    Ralene Ok 09/02/2021

## 2021-09-03 LAB — CBC
HCT: 33.6 % — ABNORMAL LOW (ref 36.0–46.0)
Hemoglobin: 11.5 g/dL — ABNORMAL LOW (ref 12.0–15.0)
MCH: 33.7 pg (ref 26.0–34.0)
MCHC: 34.2 g/dL (ref 30.0–36.0)
MCV: 98.5 fL (ref 80.0–100.0)
Platelets: 161 10*3/uL (ref 150–400)
RBC: 3.41 MIL/uL — ABNORMAL LOW (ref 3.87–5.11)
RDW: 12.5 % (ref 11.5–15.5)
WBC: 5.7 10*3/uL (ref 4.0–10.5)
nRBC: 0 % (ref 0.0–0.2)

## 2021-09-03 LAB — APTT: aPTT: 106 seconds — ABNORMAL HIGH (ref 24–36)

## 2021-09-03 LAB — HEPARIN LEVEL (UNFRACTIONATED): Heparin Unfractionated: >1.1 IU/mL — ABNORMAL HIGH (ref 0.30–0.70)

## 2021-09-03 MED ORDER — AMOXICILLIN-POT CLAVULANATE 875-125 MG PO TABS
1.0000 | ORAL_TABLET | Freq: Two times a day (BID) | ORAL | 0 refills | Status: AC
Start: 2021-09-03 — End: 2021-09-13

## 2021-09-03 MED ORDER — ROSUVASTATIN CALCIUM 5 MG PO TABS: 5.0000 mg | ORAL_TABLET | Freq: Every day | ORAL | 3 refills | Status: DC

## 2021-09-03 MED ORDER — APIXABAN 5 MG PO TABS
5.0000 mg | ORAL_TABLET | Freq: Two times a day (BID) | ORAL | Status: DC
  Administered 2021-09-03: 5 mg via ORAL
  Filled 2021-09-03: qty 1

## 2021-09-03 NOTE — Progress Notes (Signed)
Discharge education provided. Pt verbalized understanding. IV removed. Pressure held for 2 minutes. IV site clean, dry, and intact.

## 2021-09-03 NOTE — Discharge Summary (Signed)
Physician Discharge Summary  Amanda Wilkins WCB:[id number] DOB: [birth year] DOA: 09/01/2021  PCP: Tresa Garter, MD  Admit date: 09/01/2021 Discharge date: 09/03/2021  Time spent: 40 minutes  Recommendations for Outpatient Follow-up:  Follow outpatient CBC/CMP Follow with surgery outpatient for planning of eventual surgery Follow LFT's outpatient, crestor currently on hold for Amanda Wilkins few days Follow renal stone outpatient   Discharge Diagnoses:  Principal Problem:   Acute cholecystitis Active Problems:   Cholecystitis   Elevated LFTs   AF (paroxysmal atrial fibrillation) (HCC)   Anemia   RA (rheumatoid arthritis) (HCC)   Adjustment disorder with mixed anxiety and depressed mood   HLD (hyperlipidemia)   Renal stone   Discharge Condition: stable  Diet recommendation: heart healthy  Filed Weights   09/01/21 0825 09/03/21 0500  Weight: 106.6 kg 108.8 kg    History of present illness:   Amanda Wilkins is Amanda Wilkins 75 y.o. female with medical history significant of atrial fib on eliquis, s/p recent cardioversion on 11/14, HTN, HLD, anxiety/depression, RA, OSA on cpap presenting with 2 days of RUQ abdominal pain.   She was diagnosed with cholecystitis.  She's improved on antibiotics.  As she'd had recent cardioversion on 11/14, cardiology was consulted.  Due to higher risk of stroke after cardioversion, plan to avoid interrupting anticoagulation during this period, treat conservatively with antibiotics and follow outpatient for cholecystectomy in about 4 weeks.  Surgery and cardiology followed.    See below for additional details.  Hospital Course:  * Acute cholecystitis 2 days RUQ abdominal pain CT abdomen/pelvis with wall thickening in gallbladder and pericholecystic fluid, concerning for acute cholecystitis RUQ [place] concerning for acute cholecystitsis  Cardiology c/s, appreciate recs - "could proceed with cholecystectomy in approximately 4 weeks"  Surgery aware - ok  with discharge on abx and plan for outpatient follow up to plan surgery as outpatient (per cards, in around 4 weeks)  D/c with augmentin - pcn allergy, but discussed with pt/pharmacy - looks like she had several days of zosyn in 2016 and amoxicillin in 2013.  She's tolerating augmentin here, will continue at discharge.  Elevated LFTs ? related to cholecystitis Normal bili and alk phos Follow acute hepatitis panel - negative Hold crestor Improving, follow outpatient - recommending holding crestor Amanda Wilkins few days prior to resumption  AF (paroxysmal atrial fibrillation) (HCC) S/p recent cardioversion on 11/14 Resume eliquis Continue metoprolol, flecainide - she's no longer taking dilt, d/c'd from med list  Anemia Follow Anemia w/u - c/w AOCD  RA (rheumatoid arthritis) (HCC) Continue plaquenil   Renal stone 4 mm nonobstructing right renal stone Follow outpatient  HLD (hyperlipidemia) crestor on hold with LFTs  Adjustment disorder with mixed anxiety and depressed mood Zoloft, xanax    Procedures: none  Consultations: Surgery cardiology  Discharge Exam: Vitals:   09/02/21 1344 09/02/21 2055  BP: (!) 154/62 (!) 144/68  Pulse: 65 79  Resp: 18 18  Temp: 98.4 F (36.9 C) 98.1 F (36.7 C)  SpO2: 100% 99%   Eager to discharge Husband at bedside Discusse dd/c plan and recs  General: No acute distress. Cardiovascular: RRR Lungs: unlabored Abdomen: improved RUQ tenderness, minimal discomfort Neurological: Alert and oriented 3. Moves all extremities 4 . Cranial nerves II through XII grossly intact. Skin: Warm and dry. No rashes or lesions. Extremities: No clubbing or cyanosis. No edema.  Discharge Instructions   Discharge Instructions     Call MD for:  difficulty breathing, headache or visual disturbances   Complete by: As directed  Call MD for:  extreme fatigue   Complete by: As directed    Call MD for:  hives   Complete by: As directed    Call MD for:   persistant dizziness or light-headedness   Complete by: As directed    Call MD for:  persistant nausea and vomiting   Complete by: As directed    Call MD for:  redness, tenderness, or signs of infection (pain, swelling, redness, odor or green/yellow discharge around incision site)   Complete by: As directed    Call MD for:  severe uncontrolled pain   Complete by: As directed    Call MD for:  temperature >100.4   Complete by: As directed    Diet - low sodium heart healthy   Complete by: As directed    Discharge instructions   Complete by: As directed    You were seen for cholecystitis.   You've improved on antibiotics.  We'll send you home with 10 days of antibiotics.  You'll need to call for follow up with surgery to plan your eventual surgery after 4 weeks.    Your liver function tests are abnormal.  This is likely due to your inflamed gallbladder.  Hold your crestor for Amanda Wilkins couple days and follow up repeat labs with your PCP prior to resuming this medicine.  Continue your eliquis as prescribed by cardiology.  Follow up with cardiology as an outpatient with Dr. Tenny Craw in 4-6 weeks.   Return for new, recurrent, or worsening symptoms.  Please ask your PCP to request records from this hospitalization so they know what was done and what the next steps will be.   Increase activity slowly   Complete by: As directed       Allergies as of 09/03/2021       Reactions   Epinephrine Base Other (See Comments)   Seriously increases heart rate   Aspirin Other (See Comments)   Can take the coated 325 mg. Plain asa 325 mg speeds up the heart.   Benadryl [diphenhydramine]    Increased heart rate   Covid-19 Ad26 Vaccine(janssen) Hives    hives 6 hrs after her COVID 19 2nd booster - resolved   Fish Allergy Hives   Was imitation crab meat and broke out in hives, had skin testing for lobster and showed allergic   Hylan G-f 20 Other (See Comments)   Very painful (knee injection)   Penicillins  Other (See Comments)   Pt previously has been told not to take because of family history of reactions (water blisters). She took amoxicillin in 2012-2013 with no reaction   Tape Other (See Comments)   Blisters from adhesive tape        Medication List     TAKE these medications    acetaminophen 500 MG tablet Commonly known as: TYLENOL Take 1,000 mg by mouth every 8 (eight) hours as needed for moderate pain (pain).   Align 4 MG Caps Take 4 mg by mouth daily.   ALPRAZolam 0.25 MG tablet Commonly known as: XANAX Take 1 tablet (0.25 mg total) by mouth 2 (two) times daily as needed for anxiety.   amoxicillin-clavulanate 875-125 MG tablet Commonly known as: AUGMENTIN Take 1 tablet by mouth every 12 (twelve) hours for 10 days.   apixaban 5 MG Tabs tablet Commonly known as: ELIQUIS Take 1 tablet (5 mg total) by mouth 2 (two) times daily.   Biotin 2500 MCG Caps Take 2,500 mcg by mouth daily.   cholecalciferol 25  MCG (1000 UNIT) tablet Commonly known as: VITAMIN D3 Take 1,000 Units by mouth daily.   EPINEPHrine 0.3 mg/0.3 mL Soaj injection Commonly known as: EPI-PEN Inject 0.3 mg into the muscle as needed for anaphylaxis. As needed for life-threatening allergic reactions What changed:  when to take this additional instructions   flecainide 50 MG tablet Commonly known as: TAMBOCOR Take 1.5 tablets (75 mg total) by mouth 2 (two) times daily.   hydroxychloroquine 200 MG tablet Commonly known as: PLAQUENIL Take 400 mg by mouth every morning.   metoprolol tartrate 25 MG tablet Commonly known as: LOPRESSOR Take 1 tablet (25 mg total) by mouth 2 (two) times daily.   multivitamin capsule Take 1 capsule by mouth daily.   rosuvastatin 5 MG tablet Commonly known as: CRESTOR Take 1 tablet (5 mg total) by mouth daily. Start taking on: September 07, 2021 What changed: These instructions start on September 07, 2021. If you are unsure what to do until then, ask your doctor or  other care provider.   sertraline 50 MG tablet Commonly known as: ZOLOFT TAKE 1 TABLET BY MOUTH DAILY       Allergies  Allergen Reactions   Epinephrine Base Other (See Comments)    Seriously increases heart rate   Aspirin Other (See Comments)    Can take the coated 325 mg. Plain asa 325 mg speeds up the heart.   Benadryl [Diphenhydramine]     Increased heart rate   Covid-19 Ad26 Vaccine(Janssen) Hives     hives 6 hrs after her COVID 19 2nd booster - resolved    Fish Allergy Hives    Was imitation crab meat and broke out in hives, had skin testing for lobster and showed allergic   Hylan G-F 20 Other (See Comments)    Very painful (knee injection)   Penicillins Other (See Comments)    Pt previously has been told not to take because of family history of reactions (water blisters). She took amoxicillin in 2012-2013 with no reaction   Tape Other (See Comments)    Blisters from adhesive tape    Follow-up Information     Axel Filler, MD. Schedule an appointment as soon as possible for Amanda Wilkins visit in 2 week(s).   Specialty: General Surgery Why: fo scheduling lap chole Contact information: [address] [place] [address] [phone]         Plotnikov, Georgina Quint, MD Follow up.   Specialty: Internal Medicine Contact information: [address] [place] [address] [phone]         Pricilla Riffle, MD .   Specialty: Cardiology Contact information: [address] [place] [address] [phone]                  The results of significant diagnostics from this hospitalization (including imaging, microbiology, ancillary and laboratory) are listed below for reference.    Significant Diagnostic Studies: DG Chest 2 View  Result Date: 08/19/2021 CLINICAL DATA:  AFib and lightheadedness. EXAM: CHEST - 2 VIEW COMPARISON:  Chest radiograph dated 10/20/2020. FINDINGS: Diffuse chronic interstitial coarsening. No focal  consolidation, pleural effusion, or pneumothorax. The cardiac silhouette is within normal limits. Atherosclerotic calcification of the aorta. No acute osseous pathology. IMPRESSION: No active cardiopulmonary disease. Electronically Signed   By: Elgie Collard M.D.   On: 08/19/2021 22:02   CT ABDOMEN PELVIS W CONTRAST  Result Date: 09/01/2021 CLINICAL DATA:  Abdominal pain EXAM: CT ABDOMEN AND PELVIS WITH CONTRAST  TECHNIQUE: Multidetector CT imaging of the abdomen and pelvis was performed using the standard protocol following bolus administration of intravenous contrast. CONTRAST:  OMNIPAQUE IOHEXOL 300 MG/ML  SOLN COMPARISON:  05/20/2020 FINDINGS: Lower chest: Possible minimal scarring is seen in the periphery of lower lung fields. Hepatobiliary: There is fatty infiltration in the liver. There is mild diffuse wall thickening in the gallbladder. There is pericholecystic fluid. There is no dilation of bile ducts. Pancreas: No focal abnormality is seen. Spleen: Unremarkable. Adrenals/Urinary Tract: Adrenals are not enlarged. There is no hydronephrosis. There is malrotation in the right kidney. There is 4 mm calculus in the lower pole of right kidney. Ureters are unremarkable. Urinary bladder is unremarkable. Stomach/Bowelstomach is not distended. Small bowel loops are not dilated. Appendix is not dilated. There is no significant wall thickening in the colon. Scattered diverticula are seen in colon without signs of focal acute diverticulitis. Vascular/Lymphatic: Unremarkable. Reproductive: Unremarkable. Other: There is no ascites or pneumoperitoneum. Small umbilical hernia containing fat is seen. Musculoskeletal: Degenerative changes noted in the lumbar spine, particularly severe at L2-L3, L3-L4 and L5-S1 levels. Degenerative changes are also noted in the lower thoracic spine. Similar findings were seen in the previous examination. IMPRESSION: There is wall thickening in the gallbladder along with  pericholecystic fluid. Findings suggest possible acute cholecystitis. Follow-up sonogram and surgical consultation should be considered. There is no evidence of intestinal obstruction or pneumoperitoneum. There is no hydronephrosis. There is Amanda Wilkins 4 mm nonobstructing right renal stone.  Fatty liver. Other findings as described in the body of the report. Electronically Signed   By: Ernie Avena M.D.   On: 09/01/2021 11:22   DG Chest Portable 1 View  Result Date: 09/01/2021 CLINICAL DATA:  Epigastric pain EXAM: PORTABLE CHEST 1 VIEW COMPARISON:  Previous studies including the examination of 08/19/2021 FINDINGS: Cardiac size is unremarkable. There are no signs of alveolar pulmonary edema or focal pulmonary consolidation. There is slight prominence of peribronchial interstitial markings with no significant interval change. There is no pleural effusion or pneumothorax. IMPRESSION: There are no new infiltrates or signs of pulmonary edema. Electronically Signed   By: Ernie Avena M.D.   On: 09/01/2021 08:56   US Abdomen Limited RUQ (LIVER/GB)  Result Date: 09/01/2021 CLINICAL DATA:  Epigastric pain, nausea, 2 days EXAM: ULTRASOUND ABDOMEN LIMITED RIGHT UPPER QUADRANT COMPARISON:  Same day CT abdomen/pelvis FINDINGS: Gallbladder: Calcified gallstones are seen measuring up to 2.1 cm. There is marked gallbladder wall thickening measuring up to 1.6 cm. There is pericholecystic fluid, and Amanda Wilkins positive sonographic Murphy's sign. Common bile duct: Diameter: 6 mm. Liver: No focal lesion identified. Within normal limits in parenchymal echogenicity. Portal vein is patent on color Doppler imaging with normal direction of blood flow towards the liver. Other: None. IMPRESSION: Acute cholecystitis. Electronically Signed   By: Lesia Hausen M.D.   On: 09/01/2021 13:18    Microbiology: Recent Results (from the past 240 hour(s))  Resp Panel by RT-PCR (Flu Avy Barlett&B, Covid) Nasopharyngeal Swab     Status: None   Collection  Time: 09/01/21  8:39 AM   Specimen: Nasopharyngeal Swab; Nasopharyngeal(NP) swabs in vial transport medium  Result Value Ref Range Status   SARS Coronavirus 2 by RT PCR NEGATIVE NEGATIVE Final    Comment: (NOTE) SARS-CoV-2 target nucleic acids are NOT DETECTED.  The SARS-CoV-2 RNA is generally detectable in upper respiratory specimens during the acute phase of infection. The lowest concentration of SARS-CoV-2 viral copies this assay can detect is 138 copies/mL. Amanda Wilkins negative  result does not preclude SARS-Cov-2 infection and should not be used as the sole basis for treatment or other patient management decisions. Amanda Wilkins negative result may occur with  improper specimen collection/handling, submission of specimen other than nasopharyngeal swab, presence of viral mutation(s) within the areas targeted by this assay, and inadequate number of viral copies(<138 copies/mL). Amanda Wilkins negative result must be combined with clinical observations, patient history, and epidemiological information. The expected result is Negative.  Fact Sheet for Patients:  BloggerCourse.com  Fact Sheet for Healthcare Providers:  SeriousBroker.it  This test is no t yet approved or cleared by the [place] FDA and  has been authorized for detection and/or diagnosis of SARS-CoV-2 by FDA under an Emergency Use Authorization (EUA). This EUA will remain  in effect (meaning this test can be used) for the duration of the COVID-19 declaration under Section 564(b)(1) of the Act, 21 U.S.C.section 360bbb-3(b)(1), unless the authorization is terminated  or revoked sooner.       Influenza Amanda Wilkins by PCR NEGATIVE NEGATIVE Final   Influenza B by PCR NEGATIVE NEGATIVE Final    Comment: (NOTE) The Xpert Xpress SARS-CoV-2/FLU/RSV plus assay is intended as an aid in the diagnosis of influenza from Nasopharyngeal swab specimens and should not be used as Amanda Wilkins sole basis for treatment. Nasal washings  and aspirates are unacceptable for Xpert Xpress SARS-CoV-2/FLU/RSV testing.  Fact Sheet for Patients: BloggerCourse.com  Fact Sheet for Healthcare Providers: SeriousBroker.it  This test is not yet approved or cleared by the [place] FDA and has been authorized for detection and/or diagnosis of SARS-CoV-2 by FDA under an Emergency Use Authorization (EUA). This EUA will remain in effect (meaning this test can be used) for the duration of the COVID-19 declaration under Section 564(b)(1) of the Act, 21 U.S.C. section 360bbb-3(b)(1), unless the authorization is terminated or revoked.  Performed at [facility], [address], [place], [place] [address]      Labs: Basic Metabolic Panel: Recent Labs  Lab 09/01/21 1019 09/02/21 0447  NA 135 132*  K 4.5 4.0  CL 101 99  CO2 22 26  GLUCOSE 101* 101*  BUN 13 9  CREATININE 0.77 0.68  CALCIUM 9.8 9.1   Liver Function Tests: Recent Labs  Lab 09/01/21 1019 09/02/21 0447  AST 105* 68*  ALT 192* 154*  ALKPHOS 100 104  BILITOT 0.6 1.1  PROT 7.4 7.1  ALBUMIN 4.6 4.4   Recent Labs  Lab 09/01/21 1019  LIPASE 41   No results for input(s): AMMONIA in the last 168 hours. CBC: Recent Labs  Lab 09/01/21 0838 09/02/21 0447 09/03/21 0122  WBC 5.4 6.5 5.7  NEUTROABS 3.8  --   --   HGB 11.7* 11.8* 11.5*  HCT 35.0* 34.7* 33.6*  MCV 98.9 98.9 98.5  PLT 171 175 161   Cardiac Enzymes: No results for input(s): CKTOTAL, CKMB, CKMBINDEX, TROPONINI in the last 168 hours. BNP: BNP (last 3 results) No results for input(s): BNP in the last 8760 hours.  ProBNP (last 3 results) No results for input(s): PROBNP in the last 8760 hours.  CBG: No results for input(s): GLUCAP in the last 168 hours.     Signed:  Lacretia Nicks MD.  [facility] 09/03/2021, 12:50 PM

## 2021-09-03 NOTE — Progress Notes (Signed)
Subjective/Chief Complaint: Pt asking to go home  Less abd pain Appreciate Cards input   Objective: Vital signs in last 24 hours: Temp:  [98.1 F (36.7 C)-98.4 F (36.9 C)] 98.1 F (36.7 C) (11/19 2055) Pulse Rate:  [65-79] 79 (11/19 2055) Resp:  [18] 18 (11/19 2055) BP: (144-154)/(62-68) 144/68 (11/19 2055) SpO2:  [99 %-100 %] 99 % (11/19 2055) Weight:  [108.8 kg] 108.8 kg (11/20 0500) Last BM Date: 09/03/21  Intake/Output from previous day: 11/19 0701 - 11/20 0700 In: 667.3 [P.O.:360; I.V.:307.3] Out: 1200 [Urine:1200] Intake/Output this shift: No intake/output data recorded.  PE:  Constitutional: No acute distress, conversant, appears states age. Eyes: Anicteric sclerae, moist conjunctiva, no lid lag Lungs: Clear to auscultation bilaterally, normal respiratory effort CV: regular rate and rhythm, no murmurs, no peripheral edema, pedal pulses 2+ GI: Soft, no masses or hepatosplenomegaly, non-tender to palpation Skin: No rashes, palpation reveals normal turgor Psychiatric: appropriate judgment and insight, oriented to person, place, and time   Lab Results:  Recent Labs    09/02/21 0447 09/03/21 0122  WBC 6.5 5.7  HGB 11.8* 11.5*  HCT 34.7* 33.6*  PLT 175 161   BMET Recent Labs    09/01/21 1019 09/02/21 0447  NA 135 132*  K 4.5 4.0  CL 101 99  CO2 22 26  GLUCOSE 101* 101*  BUN 13 9  CREATININE 0.77 0.68  CALCIUM 9.8 9.1   PT/INR Recent Labs    09/01/21 1851  LABPROT 13.1  INR 1.0   ABG No results for input(s): PHART, HCO3 in the last 72 hours.  Invalid input(s): PCO2, PO2  Studies/Results: CT ABDOMEN PELVIS W CONTRAST  Result Date: 09/01/2021 CLINICAL DATA:  Abdominal pain EXAM: CT ABDOMEN AND PELVIS WITH CONTRAST TECHNIQUE: Multidetector CT imaging of the abdomen and pelvis was performed using the standard protocol following bolus administration of intravenous contrast. CONTRAST:  170mL OMNIPAQUE IOHEXOL 300 MG/ML  SOLN COMPARISON:   05/20/2020 FINDINGS: Lower chest: Possible minimal scarring is seen in the periphery of lower lung fields. Hepatobiliary: There is fatty infiltration in the liver. There is mild diffuse wall thickening in the gallbladder. There is pericholecystic fluid. There is no dilation of bile ducts. Pancreas: No focal abnormality is seen. Spleen: Unremarkable. Adrenals/Urinary Tract: Adrenals are not enlarged. There is no hydronephrosis. There is malrotation in the right kidney. There is 4 mm calculus in the lower pole of right kidney. Ureters are unremarkable. Urinary bladder is unremarkable. Stomach/Bowelstomach is not distended. Small bowel loops are not dilated. Appendix is not dilated. There is no significant wall thickening in the colon. Scattered diverticula are seen in colon without signs of focal acute diverticulitis. Vascular/Lymphatic: Unremarkable. Reproductive: Unremarkable. Other: There is no ascites or pneumoperitoneum. Small umbilical hernia containing fat is seen. Musculoskeletal: Degenerative changes noted in the lumbar spine, particularly severe at L2-L3, L3-L4 and L5-S1 levels. Degenerative changes are also noted in the lower thoracic spine. Similar findings were seen in the previous examination. IMPRESSION: There is wall thickening in the gallbladder along with pericholecystic fluid. Findings suggest possible acute cholecystitis. Follow-up sonogram and surgical consultation should be considered. There is no evidence of intestinal obstruction or pneumoperitoneum. There is no hydronephrosis. There is a 4 mm nonobstructing right renal stone.  Fatty liver. Other findings as described in the body of the report. Electronically Signed   By: Elmer Picker M.D.   On: 09/01/2021 11:22   DG Chest Portable 1 View  Result Date: 09/01/2021 CLINICAL DATA:  Epigastric pain EXAM:  PORTABLE CHEST 1 VIEW COMPARISON:  Previous studies including the examination of 08/19/2021 FINDINGS: Cardiac size is unremarkable.  There are no signs of alveolar pulmonary edema or focal pulmonary consolidation. There is slight prominence of peribronchial interstitial markings with no significant interval change. There is no pleural effusion or pneumothorax. IMPRESSION: There are no new infiltrates or signs of pulmonary edema. Electronically Signed   By: Elmer Picker M.D.   On: 09/01/2021 08:56   US Abdomen Limited RUQ (LIVER/GB)  Result Date: 09/01/2021 CLINICAL DATA:  Epigastric pain, nausea, 2 days EXAM: ULTRASOUND ABDOMEN LIMITED RIGHT UPPER QUADRANT COMPARISON:  Same day CT abdomen/pelvis FINDINGS: Gallbladder: Calcified gallstones are seen measuring up to 2.1 cm. There is marked gallbladder wall thickening measuring up to 1.6 cm. There is pericholecystic fluid, and a positive sonographic Murphy's sign. Common bile duct: Diameter: 6 mm. Liver: No focal lesion identified. Within normal limits in parenchymal echogenicity. Portal vein is patent on color Doppler imaging with normal direction of blood flow towards the liver. Other: None. IMPRESSION: Acute cholecystitis. Electronically Signed   By: Valetta Mole M.D.   On: 09/01/2021 13:18    Anti-infectives: Anti-infectives (From admission, onward)    Start     Dose/Rate Route Frequency Ordered Stop   09/02/21 2200  amoxicillin-clavulanate (AUGMENTIN) 875-125 MG per tablet 1 tablet        1 tablet Oral Every 12 hours 09/02/21 1503     09/02/21 1200  cefTRIAXone (ROCEPHIN) 2 g in sodium chloride 0.9 % 100 mL IVPB  Status:  Discontinued        2 g 200 mL/hr over 30 Minutes Intravenous Every 24 hours 09/01/21 1744 09/02/21 1504   09/02/21 1000  hydroxychloroquine (PLAQUENIL) tablet 400 mg        400 mg Oral Every morning 09/01/21 1802     09/01/21 2200  metroNIDAZOLE (FLAGYL) tablet 500 mg  Status:  Discontinued        500 mg Oral Every 12 hours 09/01/21 1744 09/02/21 1504   09/01/21 1145  cefTRIAXone (ROCEPHIN) 1 g in sodium chloride 0.9 % 100 mL IVPB        1 g 200  mL/hr over 30 Minutes Intravenous  Once 09/01/21 1144 09/01/21 1253   09/01/21 1145  metroNIDAZOLE (FLAGYL) IVPB 500 mg        500 mg 100 mL/hr over 60 Minutes Intravenous  Once 09/01/21 1144 09/01/21 1433       Assessment/Plan: Acute cholecystitis -appreciate Cards input.  Will plan lap chole in approx 7month so she can be off her anticoag. -OK to adv diet as tol.  If pt is able to tol soft diet today OK from surg standpoint to DC and f/u with [place] in clinic.  D/w pt and husb that pt will benefit from low fat diet when dc'd.  Will need abx as outpt     FEN - may have FLD and ADAT from our standpoint VTE -iv heparin ID - Rocephin/Flagyl   HTN A fib, off eliquis, heparin gtt  HLD Depression/Anxiety RA OSA, on CPAP CAD Asthma  LOS: 2 days    Ralene Ok 09/03/2021

## 2021-09-03 NOTE — Progress Notes (Signed)
ANTICOAGULATION CONSULT NOTE  - brief  Pharmacy Consult for apixaban Indication: atrial fibrillation  Assessment: 50 yoF admitted on 11/18 with abdominal pain, [place] concerning for acute cholecystitsis.  PMH significant for Afib s/p recent cardioversion on 11/14 on apixaban.  Pharmacy was consulted to dose heparin IV while off apixaban for possible procedures. Today, pharmacy has been consulted to restart apixaban.  Plan: Discontinue IV heparin Apixaban 5 mg po BID (start at the time when IV heparin has been discontinued) Monitor CBC, signs/symptoms of bleeding   Amanda Wilkins, PharmD, [place] Please utilize Amion for appropriate phone number to reach the unit pharmacist ([place]) 09/03/2021 12:05 PM

## 2021-09-03 NOTE — Progress Notes (Signed)
ANTICOAGULATION CONSULT NOTE - follow up  Pharmacy Consult for Heparin Indication: atrial fibrillation  Allergies  Allergen Reactions   Epinephrine Base Other (See Comments)    Seriously increases heart rate   Aspirin Other (See Comments)    Can take the coated 325 mg. Plain asa 325 mg speeds up the heart.   Benadryl [Diphenhydramine]     Increased heart rate   Covid-19 Ad26 Vaccine(Janssen) Hives     hives 6 hrs after her COVID 19 2nd booster - resolved    Fish Allergy Hives    Was imitation crab meat and broke out in hives, had skin testing for lobster and showed allergic   Hylan G-F 20 Other (See Comments)    Very painful (knee injection)   Penicillins Other (See Comments)    Pt previously has been told not to take because of family history of reactions (water blisters). She took amoxicillin in 2012-2013 with no reaction   Tape Other (See Comments)    Blisters from adhesive tape    Patient Measurements: Height: 5\' 5"  (165.1 cm) Weight: 106.6 kg (235 lb) IBW/kg (Calculated) : 57 Heparin Dosing Weight: 81.9 kg  Vital Signs: Temp: 98.1 F (36.7 C) (11/19 2055) Temp Source: Oral (11/19 2055) BP: 144/68 (11/19 2055) Pulse Rate: 79 (11/19 2055)  Labs: Recent Labs    09/01/21 0932 09/01/21 0838 09/01/21 1019 09/01/21 1851 09/02/21 0447 09/02/21 1341 09/03/21 0122  HGB 11.7*  --   --   --  11.8*  --  11.5*  HCT 35.0*  --   --   --  34.7*  --  33.6*  PLT 171  --   --   --  175  --  161  APTT  --    < >  --  33 62* 144* 106*  LABPROT  --   --   --  13.1  --   --   --   INR  --   --   --  1.0  --   --   --   HEPARINUNFRC  --   --   --  >1.10*  --   --  >1.10*  CREATININE  --   --  0.77  --  0.68  --   --   TROPONINIHS 4  --  4  --   --   --   --    < > = values in this interval not displayed.     Estimated Creatinine Clearance: 74.8 mL/min (by C-G formula based on SCr of 0.68 mg/dL).   Medical History: Past Medical History:  Diagnosis Date   Acute  bronchitis 09/11/2016   11/17 refractory   Acute cystitis without hematuria 05/17/2015   Acute kidney injury [facility])    Labs today   Acute right ankle pain 09/09/2018   Anticoagulant long-term use    Xarelto   Axillary pain    Bronchitis    Cellulitis    Cholecystitis    Chronic interstitial nephritis    CONJUNCTIVITIS, ACUTE 10/18/2010   Qualifier: Diagnosis of  By: Alain Marion MD, Evie Lacks    D-dimer, elevated    Depression    Eye inflammation    History of interstitial nephritis 2016   chronic   History of nuclear stress test 12/16/2014   Intermediate risk nuclear study w/ medium size moderate severity reversible defect in the basal and mid inferolateral and inferior wall (per dr cardiology note , dr [name] did not think this was  consistent with ischemia)/  normal LV function and wall motion, ef 75%   History of septic shock 01/22/2015   in setting Group A Strep Cellulits erysipelas/chest wall induration with streptoccocus basterium-- Severe sepsis, DIC, Acute respiratory failure with pulmonary edema, Acute Kidney failure with chronic interstitial nephritis   Hypertension    Hyponatremia    Migraines    on Zoloft for migraines   Mild carotid artery disease ([place])    per duplex 08-30-2017 bilateral ICA 1-39%   OA (osteoarthritis) rheumotologist-  dr [name]   both knees,  shoulders, ankles   OSA on CPAP     moderate obstructive sleep apnea with an AHI of 23.4/h and oxygen desaturations as low as 84%.  Now on CPAP at 12 cm H2O.   PAF (paroxysmal atrial fibrillation) [facility]) 2009   cardiologist-- dr [name]   Paroxysmal atrial flutter [facility])    a. dx 11/2017.   PSVT (paroxysmal supraventricular tachycardia) ([place])    Wears contact lenses     Medications:  Infusions:   heparin 1,300 Units/hr (09/02/21 1706)    Assessment: 61 yoF admitted on 11/18 with abdominal pain, [place] concerning for acute cholecystitsis.  PMH significant for Afib s/p recent cardioversion on 11/14 on  apixaban.  Pharmacy is consulted to dose heparin IV while off apixaban for possible procedures.  PTA apixaban 5mg  BID.  LD 11/18 at 8am  Baseline HL > 1.1, Aptt 33 Admission CBC: Hgb low at 11.7, Plt WNL Note LFTs acutely elevated.   SCr WNL  09/03/2021 aPTT 106 supra-therapeutic on 1300 units/hr  HL > 1.1 as expected with apixaban still on board Hgb 11.5, Plts WNL No bleeding or line interruptions per RN   Goal of Therapy:  Heparin level 0.3-0.7 units/ml aPTT 66-102 seconds Monitor platelets by anticoagulation protocol: Yes   Plan:  Decrease heparin to 1200 units/hr APTT in 8 hours  Daily APTT, heparin level, and CBC Follow up surgical plans   Dolly Rias RPh 09/03/2021, 3:58 AM

## 2021-09-06 ENCOUNTER — Telehealth: Payer: Self-pay | Admitting: Internal Medicine

## 2021-09-06 ENCOUNTER — Ambulatory Visit: Payer: Medicare Other | Admitting: Internal Medicine

## 2021-09-06 DIAGNOSIS — J40 Bronchitis, not specified as acute or chronic: Secondary | ICD-10-CM | POA: Diagnosis not present

## 2021-09-09 DIAGNOSIS — G4733 Obstructive sleep apnea (adult) (pediatric): Secondary | ICD-10-CM | POA: Diagnosis not present

## 2021-09-09 DIAGNOSIS — R059 Cough, unspecified: Secondary | ICD-10-CM | POA: Diagnosis not present

## 2021-09-09 DIAGNOSIS — J101 Influenza due to other identified influenza virus with other respiratory manifestations: Secondary | ICD-10-CM | POA: Diagnosis not present

## 2021-09-09 DIAGNOSIS — R5383 Other fatigue: Secondary | ICD-10-CM | POA: Diagnosis not present

## 2021-09-09 DIAGNOSIS — I4891 Unspecified atrial fibrillation: Secondary | ICD-10-CM | POA: Diagnosis not present

## 2021-09-09 DIAGNOSIS — R062 Wheezing: Secondary | ICD-10-CM | POA: Diagnosis not present

## 2021-09-11 ENCOUNTER — Other Ambulatory Visit: Payer: Medicare Other

## 2021-09-11 ENCOUNTER — Telehealth: Payer: Self-pay | Admitting: Internal Medicine

## 2021-09-11 NOTE — Telephone Encounter (Signed)
I spoke with the pt and advised her that we can get the Flecainide level prior to her appt 09/20/21 anytime that she is feeling well... she will call back if she needs any further assistance... she says cardiac wise she is feeling well just treating her Flu symptoms.

## 2021-09-11 NOTE — Telephone Encounter (Signed)
Pt r/s lab appt for Thursday 09/14/21, pt would like a return call from the nurse, she wants to know if this lab work is necessary because she has the flu

## 2021-09-12 ENCOUNTER — Other Ambulatory Visit: Payer: Self-pay

## 2021-09-12 ENCOUNTER — Encounter: Payer: Self-pay | Admitting: Cardiology

## 2021-09-12 ENCOUNTER — Telehealth (INDEPENDENT_AMBULATORY_CARE_PROVIDER_SITE_OTHER): Payer: Medicare Other | Admitting: Cardiology

## 2021-09-12 VITALS — BP 120/80 | HR 78 | Ht 65.0 in | Wt 228.0 lb

## 2021-09-12 DIAGNOSIS — Z9989 Dependence on other enabling machines and devices: Secondary | ICD-10-CM | POA: Diagnosis not present

## 2021-09-12 DIAGNOSIS — I10 Essential (primary) hypertension: Secondary | ICD-10-CM

## 2021-09-12 DIAGNOSIS — I2510 Atherosclerotic heart disease of native coronary artery without angina pectoris: Secondary | ICD-10-CM

## 2021-09-12 DIAGNOSIS — G4733 Obstructive sleep apnea (adult) (pediatric): Secondary | ICD-10-CM

## 2021-09-12 NOTE — Patient Instructions (Signed)

## 2021-09-12 NOTE — Progress Notes (Signed)
Virtual Visit via Video Note   This visit type was conducted due to national recommendations for restrictions regarding the COVID-19 Pandemic (e.g. social distancing) in an effort to limit this patient's exposure and mitigate transmission in our community.  Due to her co-morbid illnesses, this patient is at least at moderate risk for complications without adequate follow up.  This format is felt to be most appropriate for this patient at this time.  All issues noted in this document were discussed and addressed.  A limited physical exam was performed with this format.  Please refer to the patient's chart for her consent to telehealth for [facility].   Evaluation Performed:  Follow-up visit  Date:  09/12/2021   ID:  Amanda Wilkins, DOB 1946/03/13, MRN [id number]  Patient Location:  Home  Provider location:   [place]  PCP:  Plotnikov, Evie Lacks, MD  Cardiologist:  [name], MD  Sleep Medicine:  Fransico Him, MD Electrophysiologist:  None   Chief Complaint:  OSA  History of Present Illness:    Amanda Wilkins is a 75 y.o. female who presents via audio/video conferencing for a telehealth visit today.    Amanda Wilkins is a 75y.o. female with a hx of paroxysmal atrial fibrillation and flutter, hypertension and sleep apnea. Due to ongoing hypertension, snoring atrial fibrillation sleep study was ordered showing moderate obstructive sleep apnea with an AHI of 23.4/h and oxygen desaturations as low as 84%.  She subsequently underwent CPAP titration to 12 cm H2O.    She is doing well with her CPAP device and thinks that she has gotten used to it.   About 3 weeks ago she was hospitalized with GB attack.  Unfortunately 2 days after she got home she got sick with influenza.  She tolerates the mask and feels the pressure is adequate.  Since going on CPAP she feels rested in the am and has no significant daytime sleepiness.  She denies any significant mouth or nasal dryness  or nasal congestion.  She does not think that he snores.    The patient does not have symptoms concerning for COVID-19 infection (fever, chills, cough, or new shortness of breath).   Prior CV studies:   The following studies were reviewed today:  PAP compliance download  Past Medical History:  Diagnosis Date   Acute bronchitis 09/11/2016   11/17 refractory   Acute cystitis without hematuria 05/17/2015   Acute kidney injury [facility])    Labs today   Acute right ankle pain 09/09/2018   Anticoagulant long-term use    Xarelto   Axillary pain    Bronchitis    Cellulitis    Cholecystitis    Chronic interstitial nephritis    CONJUNCTIVITIS, ACUTE 10/18/2010   Qualifier: Diagnosis of  By: Alain Marion MD, Evie Lacks    D-dimer, elevated    Depression    Eye inflammation    History of interstitial nephritis 2016   chronic   History of nuclear stress test 12/16/2014   Intermediate risk nuclear study w/ medium size moderate severity reversible defect in the basal and mid inferolateral and inferior wall (per dr cardiology note , dr [name] did not think this was consistent with ischemia)/  normal LV function and wall motion, ef 75%   History of septic shock 01/22/2015   in setting Group A Strep Cellulits erysipelas/chest wall induration with streptoccocus basterium-- Severe sepsis, DIC, Acute respiratory failure with pulmonary edema, Acute Kidney failure with chronic interstitial nephritis   Hypertension  Hyponatremia    Migraines    on Zoloft for migraines   Mild carotid artery disease ([place])    per duplex 08-30-2017 bilateral ICA 1-39%   OA (osteoarthritis) rheumotologist-  dr [name]   both knees,  shoulders, ankles   OSA on CPAP     moderate obstructive sleep apnea with an AHI of 23.4/h and oxygen desaturations as low as 84%.  Now on CPAP at 12 cm H2O.   PAF (paroxysmal atrial fibrillation) [facility]) 2009   cardiologist-- dr [name]   Paroxysmal atrial flutter [facility])    a. dx  11/2017.   PSVT (paroxysmal supraventricular tachycardia) ([place])    Wears contact lenses    Past Surgical History:  Procedure Laterality Date   BREAST BIOPSY Right 05/15/2017   [place]   CARDIOVERSION N/A 08/28/2021   Procedure: CARDIOVERSION;  Surgeon: Fay Records, MD;  Location: [facility] ENDOSCOPY;  Service: Cardiovascular;  Laterality: N/A;   [place]   ESOPHAGOGASTRODUODENOSCOPY N/A 02/08/2015   Procedure: ESOPHAGOGASTRODUODENOSCOPY (EGD);  Surgeon: [name], MD;  Location: [place];  Service: Endoscopy;  Laterality: N/A;   KNEE ARTHROSCOPY W/ MENISCAL REPAIR Left 08/2014    @[facility]    SHOULDER SURGERY Right 04/04/2016   TOTAL KNEE ARTHROPLASTY Right 09/01/2018   Procedure: RIGHT TOTAL KNEE ARTHROPLASTY;  Surgeon: [name], MD;  Location: [facility] ORS;  Service: Orthopedics;  Laterality: Right;     Current Meds  Medication Sig   acetaminophen (TYLENOL) 500 MG tablet Take 1,000 mg by mouth every 8 (eight) hours as needed for moderate pain (pain).   ALPRAZolam (XANAX) 0.25 MG tablet Take 1 tablet (0.25 mg total) by mouth 2 (two) times daily as needed for anxiety.   amoxicillin-clavulanate (AUGMENTIN) 875-125 MG tablet Take 1 tablet by mouth every 12 (twelve) hours for 10 days.   apixaban (ELIQUIS) 5 MG TABS tablet Take 1 tablet (5 mg total) by mouth 2 (two) times daily.   benzonatate (TESSALON) 100 MG capsule Take 100 mg by mouth 3 (three) times daily.   Biotin 2500 MCG CAPS Take 2,500 mcg by mouth daily.   cholecalciferol (VITAMIN D3) 25 MCG (1000 UT) tablet Take 1,000 Units by mouth daily.   EPINEPHrine 0.3 mg/0.3 mL IJ SOAJ injection Inject 0.3 mg into the muscle as needed for anaphylaxis. As needed for life-threatening allergic reactions (Patient taking differently: Inject 0.3 mg into the muscle once as needed for anaphylaxis.)   flecainide (TAMBOCOR) 50 MG tablet Take 1.5 tablets (75 mg total) by mouth 2 (two) times daily.   hydroxychloroquine (PLAQUENIL) 200 MG tablet  Take 400 mg by mouth every morning.   levalbuterol (XOPENEX HFA) 45 MCG/ACT inhaler Inhale into the lungs.   metoprolol tartrate (LOPRESSOR) 25 MG tablet Take 1 tablet (25 mg total) by mouth 2 (two) times daily.   Multiple Vitamin (MULTIVITAMIN) capsule Take 1 capsule by mouth daily.   Probiotic Product (ALIGN) 4 MG CAPS Take 4 mg by mouth daily.   promethazine-dextromethorphan (PROMETHAZINE-DM) 6.25-15 MG/5ML syrup Take 5-10 mLs by mouth every 4 (four) hours as needed.   rosuvastatin (CRESTOR) 5 MG tablet Take 1 tablet (5 mg total) by mouth daily.   sertraline (ZOLOFT) 50 MG tablet TAKE 1 TABLET BY MOUTH DAILY (Patient taking differently: Take 50 mg by mouth daily.)     Allergies:   Epinephrine base, Aspirin, Benadryl [diphenhydramine], Covid-19 ad26 vaccine(janssen), Fish allergy, Hylan g-f 20, Penicillins, and Tape   Social History   Tobacco Use   Smoking status:  Never   Smokeless tobacco: Never   Tobacco comments:    H/O social smoking at times when drinking--never a "habit" (in the 1970s)  Vaping Use   Vaping Use: Never used  Substance Use Topics   Alcohol use: Yes    Alcohol/week: 0.0 standard drinks    Comment: Occasional   Drug use: Never     Family Hx: The patient's family history includes Allergic rhinitis in her sister; Breast cancer (age of onset: 105) in her sister; Lymphoma in her mother; Pancreatic cancer in her brother; Prostate cancer in her father; Urticaria in her sister. There is no history of Asthma, Eczema, Immunodeficiency, Atopy, or Angioedema.  ROS:   Please see the history of present illness.     All other systems reviewed and are negative.   Labs/Other Tests and Data Reviewed:    Recent Labs: 07/05/2021: TSH 2.380 08/19/2021: Magnesium 2.0 09/02/2021: ALT 154; BUN 9; Creatinine, Ser 0.68; Potassium 4.0; Sodium 132 09/03/2021: Hemoglobin 11.5; Platelets 161   Recent Lipid Panel Lab Results  Component Value Date/Time   CHOL 175 11/04/2020 12:24 PM    TRIG 69 11/04/2020 12:24 PM   HDL 102 11/04/2020 12:24 PM   CHOLHDL 1.7 11/04/2020 12:24 PM   CHOLHDL 2 05/28/2018 08:37 AM   LDLCALC 60 11/04/2020 12:24 PM    Wt Readings from Last 3 Encounters:  09/12/21 228 lb (103.4 kg)  09/03/21 239 lb 13.8 oz (108.8 kg)  08/28/21 235 lb (106.6 kg)     Objective:    Vital Signs:  BP 120/80   Pulse 78   Ht 5\' 5"  (1.651 m)   Wt 228 lb (103.4 kg)   BMI 37.94 kg/m    Well nourished, well developed female in no acute distress. Well appearing, alert and conversant, regular work of breathing,  good skin color  Eyes- anicteric mouth- oral mucosa is pink  neuro- grossly intact skin- no apparent rash or lesions or cyanosis  ASSESSMENT & PLAN:    1.  OSA - The patient is tolerating PAP therapy well without any problems. The PAP download performed by his DME was personally reviewed and interpreted by me today and showed an AHI of 2.7/hr on 12 cm H2O with 60% compliance in using more than 4 hours nightly.  The patient has been using and benefiting from PAP use and will continue to benefit from therapy.  -compliance has been down as she has influenza and is very sick and has not been able to use the device   2.  Hypertension  -BP well controlled on exam today -continue prescription drug management with Atenolol 50mg  daily with PRN refills  COVID-19 Education: The signs and symptoms of COVID-19 were discussed with the patient and how to seek care for testing (follow up with PCP or arrange E-visit).  The importance of social distancing was discussed today.  Patient Risk:   After full review of this patient's clinical status, I feel that they are at least moderate risk at this time.  Time:   Today, I have spent 20 minutes on telemedicine discussing medical problems including OSA, HTN and reviewing patient's chart including PAP compliance download.  Medication Adjustments/Labs and Tests Ordered: Current medicines are reviewed at length with the  patient today.  Concerns regarding medicines are outlined above.  Tests Ordered: No orders of the defined types were placed in this encounter.  Medication Changes: No orders of the defined types were placed in this encounter.   Disposition:  Follow up  in 1 year(s)  Signed, Fransico Him, MD  09/12/2021 8:50 AM    [facility]

## 2021-09-13 ENCOUNTER — Ambulatory Visit: Payer: Medicare Other

## 2021-09-14 ENCOUNTER — Other Ambulatory Visit: Payer: Medicare Other

## 2021-09-15 ENCOUNTER — Other Ambulatory Visit: Payer: Medicare Other | Admitting: *Deleted

## 2021-09-15 ENCOUNTER — Other Ambulatory Visit: Payer: Self-pay

## 2021-09-15 ENCOUNTER — Encounter: Payer: Self-pay | Admitting: Internal Medicine

## 2021-09-15 ENCOUNTER — Ambulatory Visit (INDEPENDENT_AMBULATORY_CARE_PROVIDER_SITE_OTHER): Payer: Medicare Other | Admitting: Internal Medicine

## 2021-09-15 VITALS — BP 130/78 | HR 77 | Temp 98.1°F | Ht 65.0 in | Wt 232.0 lb

## 2021-09-15 DIAGNOSIS — I4819 Other persistent atrial fibrillation: Secondary | ICD-10-CM | POA: Diagnosis not present

## 2021-09-15 DIAGNOSIS — R739 Hyperglycemia, unspecified: Secondary | ICD-10-CM

## 2021-09-15 DIAGNOSIS — K810 Acute cholecystitis: Secondary | ICD-10-CM

## 2021-09-15 DIAGNOSIS — I2510 Atherosclerotic heart disease of native coronary artery without angina pectoris: Secondary | ICD-10-CM | POA: Diagnosis not present

## 2021-09-15 DIAGNOSIS — J101 Influenza due to other identified influenza virus with other respiratory manifestations: Secondary | ICD-10-CM | POA: Diagnosis not present

## 2021-09-15 DIAGNOSIS — N200 Calculus of kidney: Secondary | ICD-10-CM

## 2021-09-15 DIAGNOSIS — Z79899 Other long term (current) drug therapy: Secondary | ICD-10-CM

## 2021-09-15 DIAGNOSIS — I480 Paroxysmal atrial fibrillation: Secondary | ICD-10-CM | POA: Diagnosis not present

## 2021-09-15 MED ORDER — HYDROCODONE BIT-HOMATROP MBR 5-1.5 MG/5ML PO SOLN: 5.0000 mL | Freq: Four times a day (QID) | ORAL | 0 refills | Status: AC | PRN

## 2021-09-15 NOTE — Patient Instructions (Signed)
Please take all new medication as prescribed - the cough medicine as needed  Please continue all other medications as before, and refills have been done if requested.  Please have the pharmacy call with any other refills you may need.  Please continue your efforts at being more active, low cholesterol diet, and weight control.  You are otherwise up to date with prevention measures today.  Please keep your appointments with your specialists as you may have planned - cardiology next wk as planned  Please go to the LAB at the blood drawing area for the tests to be done  You will be contacted by phone if any changes need to be made immediately.  Otherwise, you will receive a letter about your results with an explanation, but please check with MyChart first.  Please remember to sign up for MyChart if you have not done so, as this will be important to you in the future with finding out test results, communicating by private email, and scheduling acute appointments online when needed.

## 2021-09-15 NOTE — Progress Notes (Signed)
Patient ID: Amanda Wilkins, female   DOB: Jun 25, 1946, 75 y.o.   MRN: [id number]        Chief Complaint: follow up recent afib on eliquis, influenza A, acute cholecystitis, and right renal stone       HPI:  Amanda Wilkins is a 75 y.o. female here with c/o recent complicated hx where husband initially had covid infection and likely contracted from him now with persistent non prod cough, then herself had afib then cardioversion now on eiliquis for 4 wks, complicated by acute cholecystitis but surgury delayed until more stable post eliquis, then onset acute Influenza A with symptoms starting nov 21; and overall evaluation also significant for incidental 4 mm right renal stone non productive and asymptomatic.  Today f/u with me as PCP no availiable,  Pt denies chest pain, increased sob or doe, wheezing, orthopnea, PND, increased LE swelling, palpitations, dizziness or syncope.   Pt denies polydipsia, polyuria, and Denies worsening reflux, abd pain, dysphagia, n/v, bowel change or blood.   Denies urinary symptoms such as dysuria, frequency, urgency, flank pain, hematuria or n/v, fever, chills.        Wt Readings from Last 3 Encounters:  09/20/21 231 lb 9.6 oz (105.1 kg)  09/15/21 232 lb (105.2 kg)  09/12/21 228 lb (103.4 kg)   BP Readings from Last 3 Encounters:  09/20/21 126/72  09/15/21 130/78  09/12/21 120/80         Past Medical History:  Diagnosis Date   Acute bronchitis 09/11/2016   11/17 refractory   Acute cystitis without hematuria 05/17/2015   Acute kidney injury [facility])    Labs today   Acute right ankle pain 09/09/2018   Anticoagulant long-term use    Xarelto   Axillary pain    Bronchitis    Cellulitis    Cholecystitis    Chronic interstitial nephritis    CONJUNCTIVITIS, ACUTE 10/18/2010   Qualifier: Diagnosis of  By: Alain Marion MD, Evie Lacks    D-dimer, elevated    Depression    Eye inflammation    History of interstitial nephritis 2016   chronic   History of nuclear  stress test 12/16/2014   Intermediate risk nuclear study w/ medium size moderate severity reversible defect in the basal and mid inferolateral and inferior wall (per dr cardiology note , dr [name] did not think this was consistent with ischemia)/  normal LV function and wall motion, ef 75%   History of septic shock 01/22/2015   in setting Group A Strep Cellulits erysipelas/chest wall induration with streptoccocus basterium-- Severe sepsis, DIC, Acute respiratory failure with pulmonary edema, Acute Kidney failure with chronic interstitial nephritis   Hypertension    Hyponatremia    Migraines    on Zoloft for migraines   Mild carotid artery disease ([place])    per duplex 08-30-2017 bilateral ICA 1-39%   OA (osteoarthritis) rheumotologist-  dr [name]   both knees,  shoulders, ankles   OSA on CPAP     moderate obstructive sleep apnea with an AHI of 23.4/h and oxygen desaturations as low as 84%.  Now on CPAP at 12 cm H2O.   PAF (paroxysmal atrial fibrillation) [facility]) 2009   cardiologist-- dr [name]   Paroxysmal atrial flutter [facility])    a. dx 11/2017.   PSVT (paroxysmal supraventricular tachycardia) ([place])    Wears contact lenses    Past Surgical History:  Procedure Laterality Date   BREAST BIOPSY Right 05/15/2017   [facility]   CARDIOVERSION N/A 08/28/2021  Procedure: CARDIOVERSION;  Surgeon: Fay Records, MD;  Location: [facility] ENDOSCOPY;  Service: Cardiovascular;  Laterality: N/A;   [place]   ESOPHAGOGASTRODUODENOSCOPY N/A 02/08/2015   Procedure: ESOPHAGOGASTRODUODENOSCOPY (EGD);  Surgeon: [name], MD;  Location: [place];  Service: Endoscopy;  Laterality: N/A;   KNEE ARTHROSCOPY W/ MENISCAL REPAIR Left 08/2014    @[facility]    SHOULDER SURGERY Right 04/04/2016   TOTAL KNEE ARTHROPLASTY Right 09/01/2018   Procedure: RIGHT TOTAL KNEE ARTHROPLASTY;  Surgeon: [name], MD;  Location: [facility] ORS;  Service: Orthopedics;  Laterality: Right;    reports that she has never  smoked. She has never used smokeless tobacco. She reports current alcohol use. She reports that she does not use drugs. family history includes Allergic rhinitis in her sister; Breast cancer (age of onset: 25) in her sister; Lymphoma in her mother; Pancreatic cancer in her brother; Prostate cancer in her father; Urticaria in her sister. Allergies  Allergen Reactions   Epinephrine Base Other (See Comments)    Seriously increases heart rate   Aspirin Other (See Comments)    Can take the coated 325 mg. Plain asa 325 mg speeds up the heart.   Benadryl [Diphenhydramine]     Increased heart rate   Covid-19 Ad26 Vaccine(Janssen) Hives     hives 6 hrs after her COVID 19 2nd booster - resolved    Fish Allergy Hives    Was imitation crab meat and broke out in hives, had skin testing for lobster and showed allergic   Hylan G-F 20 Other (See Comments)    Very painful (knee injection)   Penicillins Other (See Comments)    Pt previously has been told not to take because of family history of reactions (water blisters). She took amoxicillin in 2012-2013 with no reaction   Tape Other (See Comments)    Blisters from adhesive tape   Current Outpatient Medications on File Prior to Visit  Medication Sig Dispense Refill   acetaminophen (TYLENOL) 500 MG tablet Take 1,000 mg by mouth every 8 (eight) hours as needed for moderate pain (pain).     ALPRAZolam (XANAX) 0.25 MG tablet Take 1 tablet (0.25 mg total) by mouth 2 (two) times daily as needed for anxiety. 60 tablet 1   apixaban (ELIQUIS) 5 MG TABS tablet Take 1 tablet (5 mg total) by mouth 2 (two) times daily. 60 tablet 5   benzonatate (TESSALON) 100 MG capsule Take 100 mg by mouth 3 (three) times daily.     Biotin 2500 MCG CAPS Take 2,500 mcg by mouth daily.     cholecalciferol (VITAMIN D3) 25 MCG (1000 UT) tablet Take 1,000 Units by mouth daily.     EPINEPHrine 0.3 mg/0.3 mL IJ SOAJ injection Inject 0.3 mg into the muscle as needed for anaphylaxis. As  needed for life-threatening allergic reactions (Patient taking differently: Inject 0.3 mg into the muscle once as needed for anaphylaxis.) 2 each 1   flecainide (TAMBOCOR) 50 MG tablet Take 1.5 tablets (75 mg total) by mouth 2 (two) times daily. 135 tablet 3   hydroxychloroquine (PLAQUENIL) 200 MG tablet Take 400 mg by mouth every morning.     levalbuterol (XOPENEX HFA) 45 MCG/ACT inhaler Inhale into the lungs.     Multiple Vitamin (MULTIVITAMIN) capsule Take 1 capsule by mouth daily.     Probiotic Product (ALIGN) 4 MG CAPS Take 4 mg by mouth daily.     promethazine-dextromethorphan (PROMETHAZINE-DM) 6.25-15 MG/5ML syrup Take 5-10 mLs by mouth every 4 (four)  hours as needed.     rosuvastatin (CRESTOR) 5 MG tablet Take 1 tablet (5 mg total) by mouth daily. 90 tablet 3   sertraline (ZOLOFT) 50 MG tablet TAKE 1 TABLET BY MOUTH DAILY (Patient taking differently: Take 50 mg by mouth daily.) 90 tablet 1   No current facility-administered medications on file prior to visit.        ROS:  All others reviewed and negative.  Objective        PE:  BP 130/78 (BP Location: Right Arm, Patient Position: Sitting, Cuff Size: Large)   Pulse 77   Temp 98.1 F (36.7 C) (Oral)   Ht 5\' 5"  (1.651 m)   Wt 232 lb (105.2 kg)   SpO2 98%   BMI 38.61 kg/m                 Constitutional: Pt appears in NAD               HENT: Head: NCAT.                Right Ear: External ear normal.                 Left Ear: External ear normal.                Eyes: . Pupils are equal, round, and reactive to light. Conjunctivae and EOM are normal               Nose: without d/c or deformity               Neck: Neck supple. Gross normal ROM               Cardiovascular: Normal rate and regular rhythm.                 Pulmonary/Chest: Effort normal and breath sounds without rales or wheezing.                Abd:  Soft, NT, ND, + BS, no organomegaly               Neurological: Pt is alert. At baseline orientation, motor grossly  intact               Skin: Skin is warm. No rashes, no other new lesions, LE edema - none               Psychiatric: Pt behavior is normal without agitation   Micro: none  Cardiac tracings I have personally interpreted today:  none  Pertinent Radiological findings (summarize): none   Lab Results  Component Value Date   WBC 5.7 09/03/2021   HGB 11.5 (L) 09/03/2021   HCT 33.6 (L) 09/03/2021   PLT 161 09/03/2021   GLUCOSE 101 (H) 09/02/2021   CHOL 175 11/04/2020   TRIG 69 11/04/2020   HDL 102 11/04/2020   LDLCALC 60 11/04/2020   ALT 154 (H) 09/02/2021   AST 68 (H) 09/02/2021   NA 132 (L) 09/02/2021   K 4.0 09/02/2021   CL 99 09/02/2021   CREATININE 0.68 09/02/2021   BUN 9 09/02/2021   CO2 26 09/02/2021   TSH 2.380 07/05/2021   INR 1.0 09/01/2021   HGBA1C 5.8 (H) 01/30/2015   Assessment/Plan:  Shantinique Picazo is a 75 y.o. White or Caucasian [1] female with  has a past medical history of Acute bronchitis (09/11/2016), Acute cystitis without hematuria (05/17/2015), Acute kidney injury ([place]), Acute right ankle pain (09/09/2018), Anticoagulant  long-term use, Axillary pain, Bronchitis, Cellulitis, Cholecystitis, Chronic interstitial nephritis, CONJUNCTIVITIS, ACUTE (10/18/2010), D-dimer, elevated, Depression, Eye inflammation, History of interstitial nephritis (2016), History of nuclear stress test (12/16/2014), History of septic shock (01/22/2015), Hypertension, Hyponatremia, Migraines, Mild carotid artery disease ([place]), OA (osteoarthritis) (rheumotologist-  dr [name]), OSA on CPAP, PAF (paroxysmal atrial fibrillation) ([place]) (2009), Paroxysmal atrial flutter ([place]), PSVT (paroxysmal supraventricular tachycardia) ([place]), and Wears contact lenses.  Acute cholecystitis Stable for general surgury f/u post eliquis,  to f/u any worsening symptoms or concerns  Renal stone Asympt, cont to follow  Influenza A Improved, but with persistent non prod cough - for cough med prn  AF  (paroxysmal atrial fibrillation) ([facility]) Currently SR on eliquis, stable volume, cont current med tx,  to f/u any worsening symptoms or concerns   Hyperglycemia Lab Results  Component Value Date   HGBA1C 5.8 (H) 01/30/2015   Stable, pt to continue current medical treatment  - diet  Followup: Return if symptoms worsen or fail to improve.  Cathlean Cower, MD 09/21/2021 10:17 PM [facility]

## 2021-09-19 NOTE — Progress Notes (Signed)
Cardiology Office Note   Date:  09/20/2021   ID:  Amanda Wilkins, DOB 02/23/1946, MRN [id number]  PCP:  [name], MD  Cardiologist:   [name], MD    F/u of PAF     History of Present Illness: Amanda Wilkins is a 75 y.o. female with a history of chest pain, near syncope  and PAF  Echo in 2016 showed normal LVEF Carotid USN showed mild plaquing   Holter showed intermitt afib/SVT (first Dx 2010)  Recomm flecanide; pt declined Rx Myovue showed inferolateral and inferior defect consistent with ischemia  CT calcium score 2020 was 0 PFTs done for dyspnea were normal Echo in 2021 normal LVEF /RVEF CPX done in Aug 2021.  Limitation felt due to body weight  Pt enrolled into [employer] and felt much better with no signif dyspnea  At the end of July 2022  She had 4-5 glasses of wine one night   Develop tachypalpitations   Took an extra atenolol and resolved     Went to ED to get checked On arrival was in SR     BP 181/87    She was sent home  I saw her in clinic after that  Since then she had recurrent palpitations  Atenolol increased  She cut out alcohol   In early November she was admitted with afib wtth RVR  She was started on Flecanide.   Sent home      Set up for a cardioversion She had this done on 08/28/21   Converted to SR but reverted back to afib before d/c from the endo unit.    Flecanide was increased to 75 bid    Plan to check trough level at 2 wks then repeat cardioversion   She was admitted on 11/18 with abdominal pain   Found to have cholecystitits    Also found to be in SR    Due to recent conversion to SR (from flecanide) she was treate with ABX only and surgery deferred fro 1 month    Since d/c she hs done OK   Denies CP   Breathing is fair   Denies palpitations   Denies signif dizziness   Abdomen feels OK     Does have some R hip pain.   Current Meds  Medication Sig   acetaminophen (TYLENOL) 500 MG tablet Take 1,000 mg by mouth every 8 (eight) hours as  needed for moderate pain (pain).   ALPRAZolam (XANAX) 0.25 MG tablet Take 1 tablet (0.25 mg total) by mouth 2 (two) times daily as needed for anxiety.   apixaban (ELIQUIS) 5 MG TABS tablet Take 1 tablet (5 mg total) by mouth 2 (two) times daily.   Biotin 2500 MCG CAPS Take 2,500 mcg by mouth daily.   cholecalciferol (VITAMIN D3) 25 MCG (1000 UT) tablet Take 1,000 Units by mouth daily.   EPINEPHrine 0.3 mg/0.3 mL IJ SOAJ injection Inject 0.3 mg into the muscle as needed for anaphylaxis. As needed for life-threatening allergic reactions (Patient taking differently: Inject 0.3 mg into the muscle once as needed for anaphylaxis.)   flecainide (TAMBOCOR) 50 MG tablet Take 1.5 tablets (75 mg total) by mouth 2 (two) times daily.   hydroxychloroquine (PLAQUENIL) 200 MG tablet Take 400 mg by mouth every morning.   Multiple Vitamin (MULTIVITAMIN) capsule Take 1 capsule by mouth daily.   Probiotic Product (ALIGN) 4 MG CAPS Take 4 mg by mouth daily.   rosuvastatin (CRESTOR) 5 MG tablet Take 1 tablet (  5 mg total) by mouth daily.   sertraline (ZOLOFT) 50 MG tablet TAKE 1 TABLET BY MOUTH DAILY (Patient taking differently: Take 50 mg by mouth daily.)   [DISCONTINUED] metoprolol tartrate (LOPRESSOR) 25 MG tablet Take 1 tablet (25 mg total) by mouth 2 (two) times daily. (Patient taking differently: Take 25 mg by mouth daily.)     Allergies:   Epinephrine base, Aspirin, Benadryl [diphenhydramine], Covid-19 ad26 vaccine(janssen), Fish allergy, Hylan g-f 20, Penicillins, and Tape   Past Medical History:  Diagnosis Date   Acute bronchitis 09/11/2016   11/17 refractory   Acute cystitis without hematuria 05/17/2015   Acute kidney injury [facility])    Labs today   Acute right ankle pain 09/09/2018   Anticoagulant long-term use    Xarelto   Axillary pain    Bronchitis    Cellulitis    Cholecystitis    Chronic interstitial nephritis    CONJUNCTIVITIS, ACUTE 10/18/2010   Qualifier: Diagnosis of  By: Plotnikov MD,  Evie Lacks    D-dimer, elevated    Depression    Eye inflammation    History of interstitial nephritis 2016   chronic   History of nuclear stress test 12/16/2014   Intermediate risk nuclear study w/ medium size moderate severity reversible defect in the basal and mid inferolateral and inferior wall (per dr cardiology note , dr [name] did not think this was consistent with ischemia)/  normal LV function and wall motion, ef 75%   History of septic shock 01/22/2015   in setting Group A Strep Cellulits erysipelas/chest wall induration with streptoccocus basterium-- Severe sepsis, DIC, Acute respiratory failure with pulmonary edema, Acute Kidney failure with chronic interstitial nephritis   Hypertension    Hyponatremia    Migraines    on Zoloft for migraines   Mild carotid artery disease ([place])    per duplex 08-30-2017 bilateral ICA 1-39%   OA (osteoarthritis) rheumotologist-  dr [name]   both knees,  shoulders, ankles   OSA on CPAP     moderate obstructive sleep apnea with an AHI of 23.4/h and oxygen desaturations as low as 84%.  Now on CPAP at 12 cm H2O.   PAF (paroxysmal atrial fibrillation) [facility]) 2009   cardiologist-- dr [name]   Paroxysmal atrial flutter [facility])    a. dx 11/2017.   PSVT (paroxysmal supraventricular tachycardia) ([place])    Wears contact lenses     Past Surgical History:  Procedure Laterality Date   BREAST BIOPSY Right 05/15/2017   [place]   CARDIOVERSION N/A 08/28/2021   Procedure: CARDIOVERSION;  Surgeon: Fay Records, MD;  Location: [facility] ENDOSCOPY;  Service: Cardiovascular;  Laterality: N/A;   [place]   ESOPHAGOGASTRODUODENOSCOPY N/A 02/08/2015   Procedure: ESOPHAGOGASTRODUODENOSCOPY (EGD);  Surgeon: [name], MD;  Location: [place];  Service: Endoscopy;  Laterality: N/A;   KNEE ARTHROSCOPY W/ MENISCAL REPAIR Left 08/2014    @[facility]    SHOULDER SURGERY Right 04/04/2016   TOTAL KNEE ARTHROPLASTY Right 09/01/2018   Procedure: RIGHT  TOTAL KNEE ARTHROPLASTY;  Surgeon: [name], MD;  Location[facility];  Service: Orthopedics;  Laterality: Right;     Social History:  The patient  reports that she has never smoked. She has never used smokeless tobacco. She reports current alcohol use. She reports that she does not use drugs.   Family History:  The patient's family history includes Allergic rhinitis in her sister; Breast cancer (age of onset: 94) in her sister; Lymphoma in her mother;  Pancreatic cancer in her brother; Prostate cancer in her father; Urticaria in her sister.    ROS:  Please see the history of present illness. All other systems are reviewed and  Negative to the above problem except as noted.    PHYSICAL EXAM: VS:  BP 126/72   Pulse 77   Ht 5\' 5"  (1.651 m)   Wt 231 lb 9.6 oz (105.1 kg)   SpO2 98%   BMI 38.54 kg/m   GEN: Morbidly obese 75 yo  in no acute distress  HEENT: normal  Neck: JVP is normal  No bruits Cardiac: RRR; no murmurs,, NO LE  edema  Respiratory:  clear to auscultation bilaterally,  GI: soft, nontender, nondistended, + BS  No hepatomegaly  MS: no deformity Moving all extremities   Skin: warm and dry Neuro:  Grossly intact   Psych: euthymic mood, full affect   EKG:  EKG is  Not ordered today.   Lipid Panel    Component Value Date/Time   CHOL 175 11/04/2020 1224   TRIG 69 11/04/2020 1224   HDL 102 11/04/2020 1224   CHOLHDL 1.7 11/04/2020 1224   CHOLHDL 2 05/28/2018 0837   VLDL 13.8 05/28/2018 0837   LDLCALC 60 11/04/2020 1224      Wt Readings from Last 3 Encounters:  09/20/21 231 lb 9.6 oz (105.1 kg)  09/15/21 232 lb (105.2 kg)  09/12/21 228 lb (103.4 kg)    Echo:   1. Left ventricular ejection fraction, by estimation, is 55 to 60%. The left ventricle has normal function. The left ventricle has no regional wall motion abnormalities. There is mild concentric left ventricular hypertrophy. Left ventricular diastolic parameters were normal. 2. Right ventricular systolic  function is normal. The right ventricular size is normal. There is moderately elevated pulmonary artery systolic pressure. 3. The mitral valve is normal in structure and function. Mild mitral valve regurgitation. No evidence of mitral stenosis. 4. The aortic valve is tricuspid. Aortic valve regurgitation is not visualized. No aortic stenosis is present. 5. The inferior vena cava is normal in size with <50% respiratory variability, suggesting right atrial pressure of 8 mmHg. Comparison(s): 01/25/15 EF 55-60%. PA pressure 86mmHg.  ASSESSMENT AND PLAN  1 PAF  Patient is maintainng SR after converting to it when flecanide increased.  Needs 4 wks of anticoagulation from day of most recent adit to assure any possible clot has dissolved or endothelialized    Resume when safe after surgery    2   Preop assessment   As noted above   When surgery planned she should be at low risk for major cardiac event other than afib   Follow on tele  Continue meds    3  HTN   BP is controlled    4  Hx diastolic dysfunction. Volume appears OK      5  Hx CP    Denies CP   Ca score is 0   Pt has mild plaquing of aorta  Continue Crestor    6 CV dz Mild plaquing  Keep on statin     7 HL  Keep on Crestor given atherosclerosis of aorta   LDL 60  HDL 102  Trig 69     Plan for f/u next spring   Sooner for problems      Current medicines are reviewed at length with the patient today.  The patient does not have concerns regarding medicines.  Signed, [name], MD  09/20/2021 10:51 PM  [facility] [address], [place], [place]  [address] Phone: [phone]; Fax: [phone]

## 2021-09-20 ENCOUNTER — Encounter: Payer: Self-pay | Admitting: Internal Medicine

## 2021-09-20 ENCOUNTER — Other Ambulatory Visit: Payer: Self-pay

## 2021-09-20 ENCOUNTER — Ambulatory Visit (INDEPENDENT_AMBULATORY_CARE_PROVIDER_SITE_OTHER): Payer: Medicare Other | Admitting: Internal Medicine

## 2021-09-20 VITALS — BP 126/72 | HR 77 | Ht 65.0 in | Wt 231.6 lb

## 2021-09-20 DIAGNOSIS — I10 Essential (primary) hypertension: Secondary | ICD-10-CM

## 2021-09-20 DIAGNOSIS — I4819 Other persistent atrial fibrillation: Secondary | ICD-10-CM | POA: Diagnosis not present

## 2021-09-20 DIAGNOSIS — I2510 Atherosclerotic heart disease of native coronary artery without angina pectoris: Secondary | ICD-10-CM

## 2021-09-20 DIAGNOSIS — Z79899 Other long term (current) drug therapy: Secondary | ICD-10-CM | POA: Diagnosis not present

## 2021-09-20 MED ORDER — METOPROLOL TARTRATE 25 MG PO TABS: 25.0000 mg | ORAL_TABLET | Freq: Every day | ORAL | 11 refills | Status: DC

## 2021-09-20 NOTE — Patient Instructions (Signed)
Medication Instructions:  Your physician recommends that you continue on your current medications as directed. Please refer to the Current Medication list given to you today.  *If you need a refill on your cardiac medications before your next appointment, please call your pharmacy*   Lab Work: none If you have labs (blood work) drawn today and your tests are completely normal, you will receive your results only by: [place] (if you have MyChart) OR A paper copy in the mail If you have any lab test that is abnormal or we need to change your treatment, we will call you to review the results.   Testing/Procedures: none   Follow-Up: At [facility], you and your health needs are our priority.  As part of our continuing mission to provide you with exceptional heart care, we have created designated Provider Care Teams.  These Care Teams include your primary Cardiologist (physician) and Advanced Practice Providers (APPs -  Physician Assistants and Nurse Practitioners) who all work together to provide you with the care you need, when you need it.  We recommend signing up for the patient portal called "MyChart".  Sign up information is provided on this After Visit Summary.  MyChart is used to connect with patients for Virtual Visits (Telemedicine).  Patients are able to view lab/test results, encounter notes, upcoming appointments, etc.  Non-urgent messages can be sent to your provider as well.   To learn more about what you can do with MyChart, go to [email or web address].    Your next appointment:   5 month(s)  The format for your next appointment:   In Person  Provider:   [name], MD     Other Instructions

## 2021-09-21 ENCOUNTER — Encounter: Payer: Self-pay | Admitting: Internal Medicine

## 2021-09-21 DIAGNOSIS — J101 Influenza due to other identified influenza virus with other respiratory manifestations: Secondary | ICD-10-CM | POA: Insufficient documentation

## 2021-09-21 NOTE — Assessment & Plan Note (Signed)
Asympt, cont to follow

## 2021-09-21 NOTE — Assessment & Plan Note (Signed)
Improved, but with persistent non prod cough - for cough med prn

## 2021-09-21 NOTE — Assessment & Plan Note (Signed)
Lab Results  Component Value Date   HGBA1C 5.8 (H) 01/30/2015   Stable, pt to continue current medical treatment  - diet

## 2021-09-21 NOTE — Assessment & Plan Note (Signed)
Stable for general surgury f/u post eliquis,  to f/u any worsening symptoms or concerns

## 2021-09-21 NOTE — Assessment & Plan Note (Signed)
Currently SR on eliquis, stable volume, cont current med tx,  to f/u any worsening symptoms or concerns

## 2021-09-23 ENCOUNTER — Other Ambulatory Visit: Payer: Self-pay

## 2021-09-23 ENCOUNTER — Ambulatory Visit
Admission: RE | Admit: 2021-09-23 | Discharge: 2021-09-23 | Disposition: A | Discharge status: Home / Self Care | Payer: Medicare Other | Source: Ambulatory Visit | Attending: Orthopaedic Surgery | Admitting: Orthopaedic Surgery

## 2021-09-23 DIAGNOSIS — M25551 Pain in right hip: Secondary | ICD-10-CM

## 2021-09-23 DIAGNOSIS — M1611 Unilateral primary osteoarthritis, right hip: Secondary | ICD-10-CM | POA: Diagnosis not present

## 2021-09-23 DIAGNOSIS — M25451 Effusion, right hip: Secondary | ICD-10-CM | POA: Diagnosis not present

## 2021-09-23 DIAGNOSIS — R600 Localized edema: Secondary | ICD-10-CM | POA: Diagnosis not present

## 2021-09-25 ENCOUNTER — Ambulatory Visit: Payer: Self-pay | Admitting: General Surgery

## 2021-09-25 DIAGNOSIS — I482 Chronic atrial fibrillation, unspecified: Secondary | ICD-10-CM | POA: Diagnosis not present

## 2021-09-25 DIAGNOSIS — K8010 Calculus of gallbladder with chronic cholecystitis without obstruction: Secondary | ICD-10-CM | POA: Diagnosis not present

## 2021-09-25 NOTE — H&P (Signed)
Chief Complaint: new problem       History of Present Illness: Amanda Wilkins is a 75 y.o. female who is seen today as an office consultation at the request of Dr. Pasty Arch for evaluation of new problem .   Patient is a 75 year old female who follows back up after being seen at [facility] for acute cholecystitis.  Patient also with a complicated cardiac history which required ongoing antiplatelet therapy.  Patient subsequently is followed up with cardiology.  The goal was to have her on flecainide antiplatelet therapy for at least 1 month postconversion.  Patient has been 1 month from admission on 1220.   Patient's had no previous abdominal pain or discomfort.  She has had some reflux.       She had a previous C-section in the past.       Review of Systems: A complete review of systems was obtained from the patient.  I have reviewed this information and discussed as appropriate with the patient.  See HPI as well for other ROS.   Review of Systems  Constitutional: Negative for fever.  HENT: Negative for congestion.   Eyes: Negative for blurred vision.  Respiratory: Negative for cough, shortness of breath and wheezing.   Cardiovascular: Negative for chest pain and palpitations.  Gastrointestinal: Positive for heartburn. Negative for abdominal pain, nausea and vomiting.  Genitourinary: Negative for dysuria.  Musculoskeletal: Negative for myalgias.  Skin: Negative for rash.  Neurological: Negative for dizziness and headaches.  Psychiatric/Behavioral: Negative for depression and suicidal ideas.  All other systems reviewed and are negative.       Medical History: Past Medical History Past Medical History: Diagnosis Date  Arrhythmia    Arthritis    Sleep apnea        There is no problem list on file for this patient.     Past Surgical History Past Surgical History: Procedure Laterality Date  csection surgery      right knee replacement surgery      right  shoulder tendon surgery N/A    torn miniscus left knee N/A        Allergies Allergies Allergen Reactions  Epinephrine Anaphylaxis and Unknown  Epinephrine Base (Refill) Other (See Comments)     Seriously increases heart rate  Adhesive Other (See Comments)     Blisters from adhesive tape  Aspirin Other (See Comments)     Can take the coated 325 mg. Plain asa 325 mg speeds up the heart. Can take the coated 325 mg. Plain asa 325 mg speeds up the heart.    Covid-19 Vaccine, Ad26.Cov2.S (Janssen) Hives      hives 6 hrs after her COVID 19 2nd booster - resolved  Diphenhydramine Unknown     Increased heart rate  Hylan G-F 20 Other (See Comments)     Very painful (knee injection) Very painful (knee injection)    Other Hives     Was imitation crab meat and broke out in hives, had skin testing for lobster and showed allergic  Penicillins Other (See Comments) and Unknown     Pt previously has been told not to take because of family history of reactions (water blisters). She took amoxicillin in 2012-2013 with no reaction Pt previously has been told not to take because of family history of reactions (water blisters). She took amoxicillin in 2012-2013 with no reaction        Current Outpatient Medications on File Prior to Visit Medication Sig Dispense Refill  ALPRAZolam Duanne Moron)  0.25 MG tablet Take 1 tablet (0.25 mg total) by mouth 2 (two) times daily as needed      apixaban (ELIQUIS) 5 mg tablet Take 1 tablet (5 mg total) by mouth      famotidine (PEPCID) 20 MG tablet Take by mouth      flecainide (TAMBOCOR) 50 MG tablet        levalbuterol (XOPENEX HFA) inhaler Inhale into the lungs      promethazine-dextromethorphan (PROMETHAZINE-DM) 6.25-15 mg/5 mL syrup Take by mouth      sertraline (ZOLOFT) 50 MG tablet 1 tablet (50 mg total)      tiZANidine (ZANAFLEX) 4 MG tablet 1 tablet (4 mg total) as needed      atenoloL (TENORMIN) 50 MG tablet 1 tablet (50 mg total)      FUROsemide (LASIX)  20 MG tablet 1 tablet (20 mg total)      hydrocodone-homatropine (HYCODAN) 5-1.5 mg/5 mL syrup Take 5 mLs by mouth every 6 (six) hours as needed      hydrOXYchloroQUINE (PLAQUENIL) 200 mg tablet 2 tabs      metoprolol tartrate (LOPRESSOR) 25 MG tablet        multivitamin capsule Take 1 capsule by mouth once daily      rosuvastatin (CRESTOR) 5 MG tablet 1 tablet (5 mg total)       No current facility-administered medications on file prior to visit.     Family History Family History Problem Relation Age of Onset  Obesity Mother    High blood pressure (Hypertension) Mother    High blood pressure (Hypertension) Father    Heart valve disease Father    High blood pressure (Hypertension) Sister    Breast cancer Sister    High blood pressure (Hypertension) Brother        Social History   Tobacco Use Smoking Status Never Smokeless Tobacco Never     Social History Social History    Socioeconomic History  Marital status: Married Tobacco Use  Smoking status: Never  Smokeless tobacco: Never Vaping Use  Vaping Use: Never used Substance and Sexual Activity  Alcohol use: Never  Drug use: Never      Objective:     Vitals:   09/25/21 0940 BP: 138/72 Pulse: 82 Temp: 36.4 C (97.6 F) SpO2: 97% Weight: (!) 105.4 kg (232 lb 6.4 oz) Height: 165.1 cm (5\' 5" )   Body mass index is 38.67 kg/m.   Physical Exam Constitutional:      Appearance: Normal appearance.  HENT:     Head: Normocephalic and atraumatic.     Mouth/Throat:     Mouth: Mucous membranes are moist.     Pharynx: Oropharynx is clear.  Eyes:     General: No scleral icterus.    Pupils: Pupils are equal, round, and reactive to light.  Cardiovascular:     Rate and Rhythm: Normal rate and regular rhythm.     Pulses: Normal pulses.     Heart sounds: No murmur heard.   No friction rub. No gallop.  Pulmonary:     Effort: Pulmonary effort is normal. No respiratory distress.     Breath sounds: Normal breath  sounds. No stridor.  Abdominal:     General: Abdomen is flat.  Musculoskeletal:        General: No swelling.  Skin:    General: Skin is warm.  Neurological:     General: No focal deficit present.     Mental Status: She is alert and oriented to person, place,  and time. Mental status is at baseline.  Psychiatric:        Mood and Affect: Mood normal.        Thought Content: Thought content normal.        Judgment: Judgment normal.        Assessment and Plan: Diagnoses and all orders for this visit:   Calculus of gallbladder with chronic cholecystitis without obstruction   Chronic a-fib (CMS-HCC)     Amanda Wilkins is a 75 y.o. female    1.  We will proceed to the OR for a lap cholecystectomy. 2. All risks and benefits were discussed with the patient to generally include: infection, bleeding, possible need for post op ERCP, damage to the bile ducts, and bile leak. Alternatives were offered and described.  All questions were answered and the patient voiced understanding of the procedure and wishes to proceed at this point with a laparoscopic cholecystectomy           No follow-ups on file.   Ralene Ok, MD, [facility] Surgery, [place] [facility]

## 2021-09-27 ENCOUNTER — Ambulatory Visit (INDEPENDENT_AMBULATORY_CARE_PROVIDER_SITE_OTHER): Payer: Medicare Other | Admitting: Orthopaedic Surgery

## 2021-09-27 ENCOUNTER — Other Ambulatory Visit: Payer: Self-pay

## 2021-09-27 ENCOUNTER — Encounter: Payer: Self-pay | Admitting: Orthopaedic Surgery

## 2021-09-27 ENCOUNTER — Ambulatory Visit (INDEPENDENT_AMBULATORY_CARE_PROVIDER_SITE_OTHER): Payer: Medicare Other | Admitting: Internal Medicine

## 2021-09-27 ENCOUNTER — Encounter: Payer: Self-pay | Admitting: Internal Medicine

## 2021-09-27 DIAGNOSIS — Z96651 Presence of right artificial knee joint: Secondary | ICD-10-CM

## 2021-09-27 DIAGNOSIS — J399 Disease of upper respiratory tract, unspecified: Secondary | ICD-10-CM | POA: Diagnosis not present

## 2021-09-27 DIAGNOSIS — I10 Essential (primary) hypertension: Secondary | ICD-10-CM

## 2021-09-27 DIAGNOSIS — I2510 Atherosclerotic heart disease of native coronary artery without angina pectoris: Secondary | ICD-10-CM

## 2021-09-27 DIAGNOSIS — J0100 Acute maxillary sinusitis, unspecified: Secondary | ICD-10-CM

## 2021-09-27 DIAGNOSIS — M1611 Unilateral primary osteoarthritis, right hip: Secondary | ICD-10-CM

## 2021-09-27 DIAGNOSIS — I4819 Other persistent atrial fibrillation: Secondary | ICD-10-CM

## 2021-09-27 LAB — FLECAINIDE LEVEL: Flecainide: 0.25 ug/mL (ref 0.20–1.00)

## 2021-09-27 MED ORDER — HYDROCODONE BIT-HOMATROP MBR 5-1.5 MG/5ML PO SOLN: 5.0000 mL | Freq: Three times a day (TID) | ORAL | 0 refills | Status: DC | PRN

## 2021-09-27 MED ORDER — AZITHROMYCIN 250 MG PO TABS: ORAL_TABLET | ORAL | 0 refills | Status: DC

## 2021-09-27 MED ORDER — METHYLPREDNISOLONE 4 MG PO TBPK: ORAL_TABLET | ORAL | 0 refills | Status: DC

## 2021-09-27 NOTE — Progress Notes (Signed)
The patient is returning for follow-up after having a MRI of her right hip and her right femur due to the pain she has been having throughout that leg.  She does have a history of a right knee replacement done by one of my colleagues in town.  I have x-rayed that knee and it looks stable and I feel stable my exam.  She has had an intra-articular injection of a steroid in the right hip joint and that only provided minimal relief.  She still has pain in her low back as well.  I can easily put her right hip through internal or external rotation but it is painful to her.  The MRI shows full-thickness cartilage loss of the right hip with reactive subchondral edema.  The remainder of his thigh appears normal.  There is degenerative changes in the lower lumbar spine as well.  From my standpoint I am recommending hip replacement.  However she has gone through recent A. fib and had a cardioversion.  She is on Eliquis.  She is having gallbladder issues and still getting over the fluid.  We need to hold off on any type of elective surgery in terms of joint replacement for several months and she understands that and agrees as well.  I recommended her using a cane in her opposite hand.  I would like to see her back in 3 months to see how she is doing overall but no x-rays are needed.

## 2021-09-27 NOTE — Patient Instructions (Signed)
Sinus rinse 

## 2021-09-27 NOTE — Progress Notes (Signed)
Subjective:  Patient ID: Amanda Wilkins, female    DOB: Apr 09, 1946  Age: 75 y.o. MRN: [id number]  CC: Follow-up (Hosp f/u- Pt states her voice is npt getting better. Throat does not hurst she is just hoarse)    HPI Cacey Willow presents for A fib, HTN, URI C/o hoarseness, cough - not better F/u on A fib, anticoagulation   "Admit date: 09/01/2021 Discharge date: 09/03/2021   Time spent: 40 minutes   Recommendations for Outpatient Follow-up:  Follow outpatient CBC/CMP Follow with surgery outpatient for planning of eventual surgery Follow LFT's outpatient, crestor currently on hold for a few days Follow renal stone outpatient     Discharge Diagnoses:  Principal Problem:   Acute cholecystitis Active Problems:   Cholecystitis   Elevated LFTs   AF (paroxysmal atrial fibrillation) (HCC)   Anemia   RA (rheumatoid arthritis) ([place])   Adjustment disorder with mixed anxiety and depressed mood   HLD (hyperlipidemia)   Renal stone"     Discharge Condition: stable  Outpatient Medications Prior to Visit  Medication Sig Dispense Refill   acetaminophen (TYLENOL) 500 MG tablet Take 1,000 mg by mouth every 8 (eight) hours as needed for moderate pain (pain).     ALPRAZolam (XANAX) 0.25 MG tablet Take 1 tablet (0.25 mg total) by mouth 2 (two) times daily as needed for anxiety. 60 tablet 1   apixaban (ELIQUIS) 5 MG TABS tablet Take 1 tablet (5 mg total) by mouth 2 (two) times daily. 60 tablet 5   Biotin 2500 MCG CAPS Take 2,500 mcg by mouth daily.     cholecalciferol (VITAMIN D3) 25 MCG (1000 UT) tablet Take 1,000 Units by mouth daily.     EPINEPHrine 0.3 mg/0.3 mL IJ SOAJ injection Inject 0.3 mg into the muscle as needed for anaphylaxis. As needed for life-threatening allergic reactions (Patient taking differently: Inject 0.3 mg into the muscle once as needed for anaphylaxis.) 2 each 1   flecainide (TAMBOCOR) 50 MG tablet Take 1.5 tablets (75 mg total) by mouth 2 (two) times  daily. 135 tablet 3   hydroxychloroquine (PLAQUENIL) 200 MG tablet Take 400 mg by mouth every morning.     metoprolol tartrate (LOPRESSOR) 25 MG tablet Take 1 tablet (25 mg total) by mouth daily. 30 tablet 11   Multiple Vitamin (MULTIVITAMIN) capsule Take 1 capsule by mouth daily.     Probiotic Product (ALIGN) 4 MG CAPS Take 4 mg by mouth daily.     rosuvastatin (CRESTOR) 5 MG tablet Take 1 tablet (5 mg total) by mouth daily. 90 tablet 3   sertraline (ZOLOFT) 50 MG tablet TAKE 1 TABLET BY MOUTH DAILY (Patient taking differently: Take 50 mg by mouth daily.) 90 tablet 1   benzonatate (TESSALON) 100 MG capsule Take 100 mg by mouth 3 (three) times daily.     levalbuterol (XOPENEX HFA) 45 MCG/ACT inhaler Inhale into the lungs.     promethazine-dextromethorphan (PROMETHAZINE-DM) 6.25-15 MG/5ML syrup Take 5-10 mLs by mouth every 4 (four) hours as needed.     No facility-administered medications prior to visit.    ROS: Review of Systems  Constitutional:  Positive for fatigue. Negative for activity change, appetite change, chills and unexpected weight change.  HENT:  Positive for congestion, postnasal drip, rhinorrhea, sinus pressure and voice change. Negative for mouth sores.   Eyes:  Negative for visual disturbance.  Respiratory:  Positive for cough. Negative for chest tightness, shortness of breath and wheezing.   Cardiovascular:  Negative for chest pain.  Gastrointestinal:  Negative for abdominal pain and nausea.  Genitourinary:  Negative for difficulty urinating, frequency and vaginal pain.  Musculoskeletal:  Negative for back pain and gait problem.  Skin:  Negative for pallor and rash.  Neurological:  Negative for dizziness, tremors, weakness, numbness and headaches.  Psychiatric/Behavioral:  Negative for confusion and sleep disturbance.    Objective:  BP 138/70 (BP Location: Left Arm)    Pulse 75    Temp 98.4 F (36.9 C) (Oral)    Ht 5\' 5"  (1.651 m)    Wt 232 lb 3.2 oz (105.3 kg)    SpO2  97%    BMI 38.64 kg/m   BP Readings from Last 3 Encounters:  09/27/21 138/70  09/20/21 126/72  09/15/21 130/78    Wt Readings from Last 3 Encounters:  09/27/21 232 lb 3.2 oz (105.3 kg)  09/20/21 231 lb 9.6 oz (105.1 kg)  09/15/21 232 lb (105.2 kg)    Physical Exam Constitutional:      General: She is not in acute distress.    Appearance: She is well-developed.  HENT:     Head: Normocephalic.     Right Ear: External ear normal.     Left Ear: External ear normal.     Nose: Nose normal.  Eyes:     General:        Right eye: No discharge.        Left eye: No discharge.     Conjunctiva/sclera: Conjunctivae normal.     Pupils: Pupils are equal, round, and reactive to light.  Neck:     Thyroid: No thyromegaly.     Vascular: No JVD.     Trachea: No tracheal deviation.  Cardiovascular:     Rate and Rhythm: Normal rate and regular rhythm.     Heart sounds: Normal heart sounds.  Pulmonary:     Effort: No respiratory distress.     Breath sounds: No stridor. No wheezing.  Abdominal:     General: Bowel sounds are normal. There is no distension.     Palpations: Abdomen is soft. There is no mass.     Tenderness: There is no abdominal tenderness. There is no guarding or rebound.  Musculoskeletal:        General: No tenderness.     Cervical back: Normal range of motion and neck supple. No rigidity.  Lymphadenopathy:     Cervical: No cervical adenopathy.  Skin:    Findings: No erythema or rash.  Neurological:     Cranial Nerves: No cranial nerve deficit.     Motor: No abnormal muscle tone.     Coordination: Coordination normal.     Deep Tendon Reflexes: Reflexes normal.  Psychiatric:        Behavior: Behavior normal.        Thought Content: Thought content normal.        Judgment: Judgment normal.  Hoarse voice, coughing  Lab Results  Component Value Date   WBC 5.7 09/03/2021   HGB 11.5 (L) 09/03/2021   HCT 33.6 (L) 09/03/2021   PLT 161 09/03/2021   GLUCOSE 101 (H)  09/02/2021   CHOL 175 11/04/2020   TRIG 69 11/04/2020   HDL 102 11/04/2020   LDLCALC 60 11/04/2020   ALT 154 (H) 09/02/2021   AST 68 (H) 09/02/2021   NA 132 (L) 09/02/2021   K 4.0 09/02/2021   CL 99 09/02/2021   CREATININE 0.68 09/02/2021   BUN 9 09/02/2021   CO2 26 09/02/2021   TSH 2.380  07/05/2021   INR 1.0 09/01/2021   HGBA1C 5.8 (H) 01/30/2015    MR FRMUR RIGHT WO CONTRAST  Result Date: 09/24/2021 CLINICAL DATA:  Right groin in hip pain. EXAM: MRI OF THE RIGHT HIP WITHOUT CONTRAST MRI OF THE RIGHT FEMUR WITHOUT CONTRAST TECHNIQUE: Multiplanar, multisequence MR imaging of the right hip was performed. No intravenous contrast was administered. Multiplanar, multisequence MR imaging of the right femur was performed. No intravenous contrast was administered. COMPARISON:  None. FINDINGS: Bones: No hip fracture, dislocation or avascular necrosis. No periosteal reaction or bone destruction. No aggressive osseous lesion. Normal sacrum and sacroiliac joints. No SI joint widening or erosive changes. Degenerative disease with disc height loss and L5-S1 with facet arthropathy. Right total knee arthroplasty. Articular cartilage and labrum Articular cartilage: Extensive full-thickness cartilage loss of the right femoral head and acetabulum with subchondral reactive marrow edema and cystic changes. Partial-thickness cartilage loss of the left femoral head and acetabulum. Labrum:  Right labral degeneration. Joint or bursal effusion Joint effusion: Small right hip joint effusion. No left hip joint effusion. No SI joint effusion. Bursae:  No bursa formation. Muscles and tendons Flexors: Normal. Extensors: Normal. Abductors: Normal. Adductors: Normal. Gluteals: Mild tendinosis of the right gluteus minimus tendon insertion with a small partial-thickness tear. Hamstrings: Normal. Other findings No pelvic free fluid. No fluid collection or hematoma. No inguinal lymphadenopathy. No inguinal hernia. IMPRESSION: 1.  Severe osteoarthritis of the right hip with subchondral reactive marrow edema. 2. Muscles are normal without focal abnormality or atrophy around the pelvis and right thigh. Electronically Signed   By: Kathreen Devoid M.D.   On: 09/24/2021 14:01   MR Hip Right w/o contrast  Result Date: 09/24/2021 CLINICAL DATA:  Right groin in hip pain. EXAM: MRI OF THE RIGHT HIP WITHOUT CONTRAST MRI OF THE RIGHT FEMUR WITHOUT CONTRAST TECHNIQUE: Multiplanar, multisequence MR imaging of the right hip was performed. No intravenous contrast was administered. Multiplanar, multisequence MR imaging of the right femur was performed. No intravenous contrast was administered. COMPARISON:  None. FINDINGS: Bones: No hip fracture, dislocation or avascular necrosis. No periosteal reaction or bone destruction. No aggressive osseous lesion. Normal sacrum and sacroiliac joints. No SI joint widening or erosive changes. Degenerative disease with disc height loss and L5-S1 with facet arthropathy. Right total knee arthroplasty. Articular cartilage and labrum Articular cartilage: Extensive full-thickness cartilage loss of the right femoral head and acetabulum with subchondral reactive marrow edema and cystic changes. Partial-thickness cartilage loss of the left femoral head and acetabulum. Labrum:  Right labral degeneration. Joint or bursal effusion Joint effusion: Small right hip joint effusion. No left hip joint effusion. No SI joint effusion. Bursae:  No bursa formation. Muscles and tendons Flexors: Normal. Extensors: Normal. Abductors: Normal. Adductors: Normal. Gluteals: Mild tendinosis of the right gluteus minimus tendon insertion with a small partial-thickness tear. Hamstrings: Normal. Other findings No pelvic free fluid. No fluid collection or hematoma. No inguinal lymphadenopathy. No inguinal hernia. IMPRESSION: 1. Severe osteoarthritis of the right hip with subchondral reactive marrow edema. 2. Muscles are normal without focal abnormality  or atrophy around the pelvis and right thigh. Electronically Signed   By: Kathreen Devoid M.D.   On: 09/24/2021 14:01    Assessment & Plan:   Problem List Items Addressed This Visit     Essential hypertension    Continue with atenolol      Persistent atrial fibrillation ([place])    Continue with Xarelto, Atenolol, flecainide      Sinus infection  Given a Z-Pak      Relevant Medications   azithromycin (ZITHROMAX Z-PAK) 250 MG tablet   methylPREDNISolone (MEDROL DOSEPAK) 4 MG TBPK tablet   HYDROcodone bit-homatropine (HYCODAN) 5-1.5 MG/5ML syrup   Upper respiratory disease    Persistent hoarseness and cough.  Prescribed Z-Pak, Hycodan, Medrol pack         Meds ordered this encounter  Medications   azithromycin (ZITHROMAX Z-PAK) 250 MG tablet    Sig: As directed    Dispense:  6 tablet    Refill:  0   methylPREDNISolone (MEDROL DOSEPAK) 4 MG TBPK tablet    Sig: As directed    Dispense:  21 tablet    Refill:  0   HYDROcodone bit-homatropine (HYCODAN) 5-1.5 MG/5ML syrup    Sig: Take 5 mLs by mouth every 8 (eight) hours as needed for cough.    Dispense:  240 mL    Refill:  0      Follow-up: Return in about 4 weeks (around 10/25/2021) for a follow-up visit.  [name], MD

## 2021-10-02 ENCOUNTER — Encounter: Payer: Self-pay | Admitting: Internal Medicine

## 2021-10-02 NOTE — Assessment & Plan Note (Signed)
Given a Z-Pak

## 2021-10-02 NOTE — Assessment & Plan Note (Signed)
Continue with Xarelto, Atenolol, flecainide

## 2021-10-02 NOTE — Assessment & Plan Note (Signed)
Continue with atenolol

## 2021-10-02 NOTE — Assessment & Plan Note (Signed)
Persistent hoarseness and cough.  Prescribed Z-Pak, Hycodan, Medrol pack

## 2021-10-17 ENCOUNTER — Other Ambulatory Visit: Payer: Self-pay

## 2021-10-17 ENCOUNTER — Ambulatory Visit
Admission: RE | Admit: 2021-10-17 | Discharge: 2021-10-17 | Disposition: A | Discharge status: Home / Self Care | Payer: Medicare Other | Source: Ambulatory Visit | Attending: Obstetrics & Gynecology | Admitting: Obstetrics & Gynecology

## 2021-10-17 DIAGNOSIS — Z1231 Encounter for screening mammogram for malignant neoplasm of breast: Secondary | ICD-10-CM

## 2021-10-24 DIAGNOSIS — Z79899 Other long term (current) drug therapy: Secondary | ICD-10-CM | POA: Diagnosis not present

## 2021-10-24 DIAGNOSIS — H43812 Vitreous degeneration, left eye: Secondary | ICD-10-CM | POA: Diagnosis not present

## 2021-10-24 DIAGNOSIS — H2513 Age-related nuclear cataract, bilateral: Secondary | ICD-10-CM | POA: Diagnosis not present

## 2021-10-24 DIAGNOSIS — H31091 Other chorioretinal scars, right eye: Secondary | ICD-10-CM | POA: Diagnosis not present

## 2021-11-07 ENCOUNTER — Telehealth: Payer: Self-pay | Admitting: Internal Medicine

## 2021-11-07 NOTE — Telephone Encounter (Signed)
I called the pt and she reports that last night she had a headache and her BP was 179/95.... she took an extra Metoprolol 25 mg.... this morning she is feeling a little better but her BP is improved some 146/94 and her headache is better. No dizziness no blurred vision.   She took her metoprolol 25 mg again this morning. I advised her to relax and rest and I will call her back in a few hours to check on her and see if her BP has improved.

## 2021-11-07 NOTE — Telephone Encounter (Signed)
I called the pt back and her BP at noon today 146/87 and HR 79... she says she has been resting since I spoke with her earlier.  I will forward to Dr. [name] for her review.   Next OV 03/06/22.

## 2021-11-07 NOTE — Telephone Encounter (Signed)
° °  Pt c/o BP issue: STAT if pt c/o blurred vision, one-sided weakness or slurred speech  1. What are your last 5 BP readings? Diastolic between  37-35  2. Are you having any other symptoms (ex. Dizziness, headache, blurred vision, passed out)? Headache   3. What is your BP issue? Pt said her BP been elevated her bottom number is between 90-95, she said she is having headache as well, she is requesting a call back from Dr. Alan Ripper nurse

## 2021-11-07 NOTE — Telephone Encounter (Signed)
Patient called in earlier today  Spoke to [place]. I call ed her    She wonders if her BP has been running higher for awhile   Has had some mild headaches  Recomm   Document BP and P  Keep on same meds  Call/email readings next Monday

## 2021-11-08 NOTE — Telephone Encounter (Signed)
Patient called this morning stating her BP today was 161/102, She states she feels nauseous, has a headache and just doesn't feel well. She wanted to make Dr. [name] aware.

## 2021-11-08 NOTE — Telephone Encounter (Signed)
Pt states that her BP this morning was 161/102.  This was prior to taking medications.  She was nauseous and had a HA.  States she took her meds a few mins before I called.  Nausea was better already.  Advised pt to check her BP in 2-3 hours to make sure it is coming down with her meds.  Advised if still high, let us know, otherwise continue with plan to monitor BPs and call/MyChart the readings on Monday.  Pt states she plans to send more readings over tomorrow because she just didn't want to get to the weekend and her BP still be elevated.  Advised I will update Dr. [name] and we will call if she has any further recommendations.

## 2021-11-09 ENCOUNTER — Encounter: Payer: Self-pay | Admitting: Internal Medicine

## 2021-11-09 MED ORDER — METOPROLOL TARTRATE 25 MG PO TABS: 25.0000 mg | ORAL_TABLET | Freq: Two times a day (BID) | ORAL | 11 refills | Status: DC

## 2021-11-09 NOTE — Telephone Encounter (Signed)
See Phone note 

## 2021-11-09 NOTE — Telephone Encounter (Signed)
Patient woke up this morning and her BP was 167/100. This is the same as it was when she went to bed last night. Patient has a headache as well. She wanted to talk to Dr. Alan Ripper Nurse today if possible

## 2021-11-09 NOTE — Telephone Encounter (Signed)
Would recomm increasing metorpolol to bid

## 2021-11-09 NOTE — Telephone Encounter (Signed)
I spoke with the pt per Dr. [name] she will try taking her Metoprolol bid and continue to monitor and follow up with [place] how she is doing... Pt does have a CPAP for apnea and she says she uses it nightly.

## 2021-11-13 ENCOUNTER — Encounter: Payer: Self-pay | Admitting: Internal Medicine

## 2021-11-14 ENCOUNTER — Encounter (HOSPITAL_COMMUNITY): Payer: Self-pay | Admitting: General Surgery

## 2021-11-14 ENCOUNTER — Other Ambulatory Visit: Payer: Self-pay

## 2021-11-14 NOTE — Progress Notes (Signed)
Spoke with pt for pre-op call. Pt has hx of A-fib and PSVT. Pt is on Flecainide and had a cardioversion on 08/28/21. She states she's been in rhythm since. Pt is on Eliquis, Dr. Rosendo Gros instructed her to hold Eliquis, her last dose was 11/13/21. Pt's cardiologist is Dr. [name]. Pt is also treated for HTN and sees Dr. [name] for that. Pt states for the last week her BP has been higher than normal. She has messaged Dr. [name] several times and on 11/09/21 Dr. [name] suggested she increase Metoprolol to twice a day. Pt states she has and she feels that it's still running high. I asked her what the highest her BP has been and she said 160/101. This morning it was 137/72. Yesterday afternoon it 153/91. She did state that she has had a headache with some of the times that her diastolic was in the 78'M. She is waiting to hear from Dr. [name] today because she sent her more readings. Pt is not diabetic. She takes Plaquenil for osteoarthritis.  Chart sent to [facility].

## 2021-11-14 NOTE — Telephone Encounter (Signed)
I would recomm adding a very low dose amlodipine 1.25 mg (1/2 of 2.5 mg)      Follow BP and HR

## 2021-11-15 ENCOUNTER — Other Ambulatory Visit: Payer: Self-pay | Admitting: *Deleted

## 2021-11-15 DIAGNOSIS — I4819 Other persistent atrial fibrillation: Secondary | ICD-10-CM

## 2021-11-15 MED ORDER — AMLODIPINE BESYLATE 2.5 MG PO TABS: 1.2500 mg | ORAL_TABLET | Freq: Every day | ORAL | 3 refills | Status: DC

## 2021-11-15 MED ORDER — APIXABAN 5 MG PO TABS: 5.0000 mg | ORAL_TABLET | Freq: Two times a day (BID) | ORAL | 5 refills | Status: DC

## 2021-11-15 NOTE — Anesthesia Preprocedure Evaluation (Addendum)
Anesthesia Evaluation  Patient identified by MRN, date of birth, ID band Patient awake    Reviewed: Allergy & Precautions, NPO status , Patient's Chart, lab work & pertinent test results  History of Anesthesia Complications Negative for: history of anesthetic complications  Airway Mallampati: IV  TM Distance: >3 FB Neck ROM: Full  Mouth opening: Limited Mouth Opening  Dental  (+) Teeth Intact, Dental Advisory Given   Pulmonary shortness of breath, sleep apnea and Continuous Positive Airway Pressure Ventilation ,    breath sounds clear to auscultation       Cardiovascular hypertension, Pt. on medications and Pt. on home beta blockers (-) angina(-) Past MI and (-) CHF + dysrhythmias Atrial Fibrillation  Rhythm:Regular  1. Left ventricular ejection fraction, by estimation, is 55 to 60%. The  left ventricle has normal function. The left ventricle has no regional  wall motion abnormalities. There is mild concentric left ventricular  hypertrophy. Left ventricular diastolic  parameters were normal.  2. Right ventricular systolic function is normal. The right ventricular  size is normal. There is moderately elevated pulmonary artery systolic  pressure.  3. The mitral valve is normal in structure and function. Mild mitral  valve regurgitation. No evidence of mitral stenosis.  4. The aortic valve is tricuspid. Aortic valve regurgitation is not  visualized. No aortic stenosis is present.  5. The inferior vena cava is normal in size with <50% respiratory  variability, suggesting right atrial pressure of 8 mmHg.    Neuro/Psych PSYCHIATRIC DISORDERS Depression  Neuromuscular disease    GI/Hepatic Neg liver ROS, GERD  ,  Endo/Other    Renal/GU Renal diseaseLab Results      Component                Value               Date                      CREATININE               0.78                11/16/2021                 Musculoskeletal  (+) Arthritis ,   Abdominal   Peds  Hematology negative hematology ROS (+) Lab Results      Component                Value               Date                      WBC                      6.5                 11/16/2021                HGB                      12.1                11/16/2021                HCT                      37.2  11/16/2021                MCV                      98.2                11/16/2021                PLT                      196                 11/16/2021              Anesthesia Other Findings   Reproductive/Obstetrics                            Anesthesia Physical Anesthesia Plan  ASA: 3  Anesthesia Plan: General   Post-op Pain Management: Tylenol PO (pre-op)   Induction: Intravenous  PONV Risk Score and Plan: 3 and Ondansetron and Dexamethasone  Airway Management Planned: Oral ETT  Additional Equipment: None  Intra-op Plan:   Post-operative Plan: Extubation in OR  Informed Consent: I have reviewed the patients History and Physical, chart, labs and discussed the procedure including the risks, benefits and alternatives for the proposed anesthesia with the patient or authorized representative who has indicated his/her understanding and acceptance.     Dental advisory given  Plan Discussed with: CRNA and Anesthesiologist  Anesthesia Plan Comments: (PAT note written 11/15/2021 by Myra Gianotti, PA-C. )       Anesthesia Quick Evaluation

## 2021-11-15 NOTE — Progress Notes (Signed)
Anesthesia Chart Review:  Case: [id number] Date/Time: 11/16/21 0900   Procedure: LAPAROSCOPIC CHOLECYSTECTOMY   Anesthesia type: General   Pre-op diagnosis: CHRONIC CHOLECYSTITIS   Location: [facility] ROOM 09 / [facility]   Surgeons: Ralene Ok, MD       DISCUSSION: Patient is a 76 year old female scheduled for the below procedure. She was admitted 09/01/21-09/03/21 for acute cholecystitis. She improved with antibiotics. Cholecystectomy plans delayed for at least 4 weeks because she was s/p DCCV on 08/28/21 and needed to complete at least one month of anticoagulation prior to holding apixaban.    Other history includes never smoker, HTN, paroxysmal afib/flutter/PSVT (s/p DCCV 08/28/21), OSA (uses CPAP), hyponatremia, sepsis with interstitial nephritis (2016), carotid artery disease (1-39% BICA 2018), GERD, osteoarthritis (right TKA 09/02/18).  Last visit with cardiologist Dr. [name] on 09/20/21. She notes patient "should be at low risk for major cardiac event other than afib  Follow on tele" for surgery scheduled after four of anticoagulation. Resume apixaban when safe after surgery. Follow-up next Spring or sooner if needed. Of note, she has had recent BP medication adjustments for elevated home BP readings, last on 11/13/21 with addition of amlodipine 2.5 mg 1/2 tablet daily. Metoprolol 25 mg also recently increased to BID.  Anesthesia team to evaluate on the day of surgery.  Last Eliquis 11/13/2021.   VS:  BP Readings from Last 3 Encounters:  09/27/21 138/70  09/20/21 126/72  09/15/21 130/78   Pulse Readings from Last 3 Encounters:  09/27/21 75  09/20/21 77  09/15/21 77     PROVIDERS: Plotnikov, Evie Lacks, MD is PCP  [name], MD is cardiologist [name], MD is rheumatologist   LABS: Labs on the day of surgery as indicated. Most recent lab results (in setting of acute cholecystitis) include: Lab Results  Component Value Date   WBC 5.7 09/03/2021   HGB 11.5 (L) 09/03/2021    HCT 33.6 (L) 09/03/2021   PLT 161 09/03/2021   GLUCOSE 101 (H) 09/02/2021   ALT 154 (H) 09/02/2021   AST 68 (H) 09/02/2021   NA 132 (L) 09/02/2021   K 4.0 09/02/2021   CL 99 09/02/2021   CREATININE 0.68 09/02/2021   BUN 9 09/02/2021   CO2 26 09/02/2021   INR 1.0 09/01/2021    PFTs 07/31/19: FVC 2.92 (93%), FEV1 2.28 (96%). DLCO unc 20.93 (102%)   IMAGES: 1V PCXR 09/01/21: FINDINGS: Cardiac size is unremarkable. There are no signs of alveolar pulmonary edema or focal pulmonary consolidation. There is slight prominence of peribronchial interstitial markings with no significant interval change. There is no pleural effusion or pneumothorax. IMPRESSION: There are no new infiltrates or signs of pulmonary edema.   EKG: 09/20/21: SR. First degree AV block.   CV: Long term cardiac monitor 07/04/21-07/12/21:  Predominant rhythm is sinus rhythm   Rates 51 to 104 bpm   Average HR 67 bpm   Frequent SVT, longest lasting 1 min 39 sec with average HR 156 bpm   Fastest HR 7 beats at 210 bpm    Rare PVCs   Triggered events correlated with SR with PVC and also SVT      CPX 05/31/20: Conclusion: Exercise testing with gas exchange demonstrates normal functional capacity when compared to matched sedentary norms. There is no clear indication for cardiopulmonary abnormality. Patient's body habitus appears  Primary role in her exercise limitation. VE/VCO2 slope is elevated and could be indicative of increased pulmonary pressures, however appears more likely hyperventilation with low PETCO2. Patient was  advised, if cardiology cleared, to begin 3x 10 min bouts of cardio exercise throughout her day to help improve dyspnea with activity.  - Preliminary impression by: Landis Martins, MS, ACSM-RCEP  - Final impression by: Glori Bickers, MD  Agree with above. Overall normal peak VO2 for matched subjects. Exercise limitation seems to be primarily due to her body habitus but there is a hypertensive response  to exercise and the VE/VCO2 slope is significantly elevated suggesting elevated pulmonary pressures during exercise. This can be related to diastolic dysfunction or baseline OHS. Agree with plan for structured exercise program/weight loss and then retesting as needed with or without invasive monitoring.    Echo 12/09/19: IMPRESSIONS   1. Left ventricular ejection fraction, by estimation, is 55 to 60%. The  left ventricle has normal function. The left ventricle has no regional  wall motion abnormalities. There is mild concentric left ventricular  hypertrophy. Left ventricular diastolic  parameters were normal.   2. Right ventricular systolic function is normal. The right ventricular  size is normal. There is moderately elevated pulmonary artery systolic  pressure.   3. The mitral valve is normal in structure and function. Mild mitral  valve regurgitation. No evidence of mitral stenosis.   4. The aortic valve is tricuspid. Aortic valve regurgitation is not  visualized. No aortic stenosis is present.   5. The inferior vena cava is normal in size with <50% respiratory  variability, suggesting right atrial pressure of 8 mmHg.  - Comparison(s): 01/25/15 EF 55-60%. PA pressure 44mmHg.    CT Cardiac Scoring 07/02/19: IMPRESSION: Coronary calcium score of 0. This was 0 percentile for age and sex matched control.   US Carotid 08/30/17: Final Interpretation:  - Right Carotid: There is evidence in the right ICA of a 1-39% stenosis. The RICA velocities remain within normal range and stable compared to the prior exam.  - Left Carotid: There is evidence in the left ICA of a 1-39% stenosis. The LICA velocities remain within normal range and stable compared to the prior exam.  - Vertebrals:  Both vertebral arteries were patent with antegrade flow.  - Subclavians: Normal flow hemodynamics were seen in bilateral subclavian arteries.  - Suggest follow up PRN.    Past Medical History:  Diagnosis Date    Acute bronchitis 09/11/2016   11/17 refractory   Acute cystitis without hematuria 05/17/2015   Acute kidney injury [facility])    Labs today   Acute right ankle pain 09/09/2018   Anticoagulant long-term use    Xarelto   Axillary pain    Bronchitis    Cellulitis    Cholecystitis    Chronic interstitial nephritis    CONJUNCTIVITIS, ACUTE 10/18/2010   Qualifier: Diagnosis of  By: Plotnikov MD, Evie Lacks    D-dimer, elevated    Depression    Eye inflammation    GERD (gastroesophageal reflux disease)    History of interstitial nephritis 2016   chronic   History of kidney stones    has a small one found on xray   History of nuclear stress test 12/16/2014   Intermediate risk nuclear study w/ medium size moderate severity reversible defect in the basal and mid inferolateral and inferior wall (per dr cardiology note , dr [name] did not think this was consistent with ischemia)/  normal LV function and wall motion, ef 75%   History of septic shock 01/22/2015   in setting Group A Strep Cellulits erysipelas/chest wall induration with streptoccocus basterium-- Severe sepsis, DIC, Acute respiratory  failure with pulmonary edema, Acute Kidney failure with chronic interstitial nephritis   Hypertension    Hyponatremia    Migraines    on Zoloft for migraines   Mild carotid artery disease ([place])    per duplex 08-30-2017 bilateral ICA 1-39%   OA (osteoarthritis) rheumotologist-  dr [name]   both knees,  shoulders, ankles   OSA on CPAP     moderate obstructive sleep apnea with an AHI of 23.4/h and oxygen desaturations as low as 84%.  Now on CPAP at 12 cm H2O.   PAF (paroxysmal atrial fibrillation) [facility]) 2009   cardiologist-- dr [name]   Paroxysmal atrial flutter [facility])    a. dx 11/2017.   PSVT (paroxysmal supraventricular tachycardia) ([place])    Wears contact lenses     Past Surgical History:  Procedure Laterality Date   BREAST BIOPSY Right 05/15/2017   [place]   CARDIOVERSION N/A  08/28/2021   Procedure: CARDIOVERSION;  Surgeon: Fay Records, MD;  Location: [facility] ENDOSCOPY;  Service: Cardiovascular;  Laterality: N/A;   [place]   ESOPHAGOGASTRODUODENOSCOPY N/A 02/08/2015   Procedure: ESOPHAGOGASTRODUODENOSCOPY (EGD);  Surgeon: [name], MD;  Location: [facility];  Service: Endoscopy;  Laterality: N/A;   KNEE ARTHROSCOPY W/ MENISCAL REPAIR Left 08/2014    @[facility]    SHOULDER SURGERY Right 04/04/2016   TOTAL KNEE ARTHROPLASTY Right 09/01/2018   Procedure: RIGHT TOTAL KNEE ARTHROPLASTY;  Surgeon: [name], MD;  Location: [facility] ORS;  Service: Orthopedics;  Laterality: Right;    MEDICATIONS: No current facility-administered medications for this encounter.    acetaminophen (TYLENOL) 500 MG tablet   ALPRAZolam (XANAX) 0.25 MG tablet   Biotin 1000 MCG tablet   cholecalciferol (VITAMIN D3) 25 MCG (1000 UT) tablet   EPINEPHrine 0.3 mg/0.3 mL IJ SOAJ injection   flecainide (TAMBOCOR) 50 MG tablet   hydroxychloroquine (PLAQUENIL) 200 MG tablet   metoprolol tartrate (LOPRESSOR) 25 MG tablet   Multiple Vitamin (MULTIVITAMIN WITH MINERALS) TABS tablet   Probiotic Product (ALIGN) 4 MG CAPS   rosuvastatin (CRESTOR) 5 MG tablet   sertraline (ZOLOFT) 50 MG tablet   amLODipine (NORVASC) 2.5 MG tablet   apixaban (ELIQUIS) 5 MG TABS tablet   azithromycin (ZITHROMAX Z-PAK) 250 MG tablet   HYDROcodone bit-homatropine (HYCODAN) 5-1.5 MG/5ML syrup   methylPREDNISolone (MEDROL DOSEPAK) 4 MG TBPK tablet    Myra Gianotti, PA-C Surgical Short Stay/Anesthesiology [facility] Phone [phone] [facility] Phone [phone] 11/15/2021 11:12 AM

## 2021-11-15 NOTE — Telephone Encounter (Signed)
Follow Up:    Patient says she would like for Ann to call her today please. She said she thought her blood pressure medicine was supposed to e adjusted.

## 2021-11-15 NOTE — Telephone Encounter (Signed)
See My Chart Encounter.

## 2021-11-15 NOTE — Telephone Encounter (Signed)
Eliquis 5mg  paper refill request received. Patient is 76 years old, weight-105.3kg, Crea-0.68 on 09/02/2021, Diagnosis-Afib, and last seen by Dr. [name] on 09/20/2021. Dose is appropriate based on dosing criteria. Will send in refill to requested pharmacy.

## 2021-11-16 ENCOUNTER — Ambulatory Visit (HOSPITAL_COMMUNITY): Payer: Medicare Other | Admitting: Vascular Surgery

## 2021-11-16 ENCOUNTER — Encounter (HOSPITAL_COMMUNITY)
Admission: RE | Disposition: A | Discharge status: Home / Self Care | Payer: Self-pay | Source: Home / Self Care | Attending: General Surgery

## 2021-11-16 ENCOUNTER — Encounter (HOSPITAL_COMMUNITY): Payer: Self-pay | Admitting: General Surgery

## 2021-11-16 ENCOUNTER — Other Ambulatory Visit: Payer: Self-pay

## 2021-11-16 ENCOUNTER — Ambulatory Visit (HOSPITAL_COMMUNITY)
Admission: RE | Admit: 2021-11-16 | Discharge: 2021-11-16 | Disposition: A | Discharge status: Home / Self Care | Payer: Medicare Other | Attending: General Surgery | Admitting: General Surgery

## 2021-11-16 DIAGNOSIS — I10 Essential (primary) hypertension: Secondary | ICD-10-CM | POA: Insufficient documentation

## 2021-11-16 DIAGNOSIS — Z96651 Presence of right artificial knee joint: Secondary | ICD-10-CM | POA: Insufficient documentation

## 2021-11-16 DIAGNOSIS — G4733 Obstructive sleep apnea (adult) (pediatric): Secondary | ICD-10-CM | POA: Diagnosis not present

## 2021-11-16 DIAGNOSIS — K8010 Calculus of gallbladder with chronic cholecystitis without obstruction: Secondary | ICD-10-CM | POA: Diagnosis not present

## 2021-11-16 DIAGNOSIS — Z7901 Long term (current) use of anticoagulants: Secondary | ICD-10-CM | POA: Diagnosis not present

## 2021-11-16 DIAGNOSIS — K219 Gastro-esophageal reflux disease without esophagitis: Secondary | ICD-10-CM | POA: Insufficient documentation

## 2021-11-16 DIAGNOSIS — I4891 Unspecified atrial fibrillation: Secondary | ICD-10-CM | POA: Diagnosis not present

## 2021-11-16 DIAGNOSIS — I482 Chronic atrial fibrillation, unspecified: Secondary | ICD-10-CM | POA: Insufficient documentation

## 2021-11-16 DIAGNOSIS — F32A Depression, unspecified: Secondary | ICD-10-CM | POA: Insufficient documentation

## 2021-11-16 DIAGNOSIS — Z9989 Dependence on other enabling machines and devices: Secondary | ICD-10-CM | POA: Diagnosis not present

## 2021-11-16 DIAGNOSIS — K811 Chronic cholecystitis: Secondary | ICD-10-CM | POA: Diagnosis present

## 2021-11-16 DIAGNOSIS — I471 Supraventricular tachycardia: Secondary | ICD-10-CM | POA: Diagnosis not present

## 2021-11-16 HISTORY — DX: Personal history of urinary calculi: Z87.442

## 2021-11-16 HISTORY — PX: CHOLECYSTECTOMY: SHX55

## 2021-11-16 HISTORY — DX: Gastro-esophageal reflux disease without esophagitis: K21.9

## 2021-11-16 LAB — BASIC METABOLIC PANEL
Anion gap: 11 (ref 5–15)
BUN: 15 mg/dL (ref 8–23)
CO2: 20 mmol/L — ABNORMAL LOW (ref 22–32)
Calcium: 9.2 mg/dL (ref 8.9–10.3)
Chloride: 98 mmol/L (ref 98–111)
Creatinine, Ser: 0.78 mg/dL (ref 0.44–1.00)
GFR, Estimated: >60 mL/min (ref >60)
Glucose, Bld: 107 mg/dL — ABNORMAL HIGH (ref 70–99)
Potassium: 4.2 mmol/L (ref 3.5–5.1)
Sodium: 129 mmol/L — ABNORMAL LOW (ref 135–145)

## 2021-11-16 LAB — CBC
HCT: 37.2 % (ref 36.0–46.0)
Hemoglobin: 12.1 g/dL (ref 12.0–15.0)
MCH: 31.9 pg (ref 26.0–34.0)
MCHC: 32.5 g/dL (ref 30.0–36.0)
MCV: 98.2 fL (ref 80.0–100.0)
Platelets: 196 10*3/uL (ref 150–400)
RBC: 3.79 MIL/uL — ABNORMAL LOW (ref 3.87–5.11)
RDW: 13.7 % (ref 11.5–15.5)
WBC: 6.5 10*3/uL (ref 4.0–10.5)
nRBC: 0 % (ref 0.0–0.2)

## 2021-11-16 SURGERY — LAPAROSCOPIC CHOLECYSTECTOMY
Anesthesia: General

## 2021-11-16 MED ORDER — EPHEDRINE SULFATE-NACL 50-0.9 MG/10ML-% IV SOSY
PREFILLED_SYRINGE | INTRAVENOUS | Status: DC | PRN
  Administered 2021-11-16: 10 mg via INTRAVENOUS

## 2021-11-16 MED ORDER — ENSURE PRE-SURGERY PO LIQD: 296.0000 mL | Freq: Once | ORAL | Status: DC

## 2021-11-16 MED ORDER — LIDOCAINE 2% (20 MG/ML) 5 ML SYRINGE
INTRAMUSCULAR | Status: DC | PRN
  Administered 2021-11-16: 60 mg via INTRAVENOUS

## 2021-11-16 MED ORDER — DEXAMETHASONE SODIUM PHOSPHATE 10 MG/ML IJ SOLN
INTRAMUSCULAR | Status: DC | PRN
  Administered 2021-11-16: 5 mg via INTRAVENOUS

## 2021-11-16 MED ORDER — 0.9 % SODIUM CHLORIDE (POUR BTL) OPTIME
TOPICAL | Status: DC | PRN
  Administered 2021-11-16: 1000 mL

## 2021-11-16 MED ORDER — ACETAMINOPHEN 10 MG/ML IV SOLN: 1000.0000 mg | Freq: Once | INTRAVENOUS | Status: DC | PRN

## 2021-11-16 MED ORDER — LACTATED RINGERS IV SOLN: INTRAVENOUS | Status: DC

## 2021-11-16 MED ORDER — FENTANYL CITRATE (PF) 250 MCG/5ML IJ SOLN
INTRAMUSCULAR | Status: AC
  Filled 2021-11-16: qty 5

## 2021-11-16 MED ORDER — ORAL CARE MOUTH RINSE: 15.0000 mL | Freq: Once | OROMUCOSAL | Status: AC

## 2021-11-16 MED ORDER — CHLORHEXIDINE GLUCONATE CLOTH 2 % EX PADS: 6.0000 | MEDICATED_PAD | Freq: Once | CUTANEOUS | Status: DC

## 2021-11-16 MED ORDER — MIDAZOLAM HCL 5 MG/5ML IJ SOLN
INTRAMUSCULAR | Status: DC | PRN
Start: 2021-11-16 — End: 2021-11-16
  Administered 2021-11-16: 1 mg via INTRAVENOUS

## 2021-11-16 MED ORDER — SUGAMMADEX SODIUM 500 MG/5ML IV SOLN
INTRAVENOUS | Status: AC
  Filled 2021-11-16: qty 5

## 2021-11-16 MED ORDER — ACETAMINOPHEN 160 MG/5ML PO SOLN: 1000.0000 mg | Freq: Once | ORAL | Status: DC | PRN

## 2021-11-16 MED ORDER — OXYCODONE HCL 5 MG PO TABS: 5.0000 mg | ORAL_TABLET | Freq: Once | ORAL | Status: DC | PRN

## 2021-11-16 MED ORDER — SUGAMMADEX SODIUM 200 MG/2ML IV SOLN
INTRAVENOUS | Status: DC | PRN
  Administered 2021-11-16: 350 mg via INTRAVENOUS

## 2021-11-16 MED ORDER — CEFAZOLIN SODIUM-DEXTROSE 2-3 GM-%(50ML) IV SOLR
INTRAVENOUS | Status: DC | PRN
Start: 2021-11-16 — End: 2021-11-16
  Administered 2021-11-16: 2 g via INTRAVENOUS

## 2021-11-16 MED ORDER — ACETAMINOPHEN 500 MG PO TABS
1000.0000 mg | ORAL_TABLET | ORAL | Status: AC
  Administered 2021-11-16: 1000 mg via ORAL
  Filled 2021-11-16: qty 2

## 2021-11-16 MED ORDER — TRAMADOL HCL 50 MG PO TABS: 50.0000 mg | ORAL_TABLET | Freq: Four times a day (QID) | ORAL | 0 refills | Status: DC | PRN

## 2021-11-16 MED ORDER — PHENYLEPHRINE 40 MCG/ML (10ML) SYRINGE FOR IV PUSH (FOR BLOOD PRESSURE SUPPORT)
PREFILLED_SYRINGE | INTRAVENOUS | Status: DC | PRN
  Administered 2021-11-16 (×2): 120 ug via INTRAVENOUS

## 2021-11-16 MED ORDER — ONDANSETRON HCL 4 MG/2ML IJ SOLN
INTRAMUSCULAR | Status: DC | PRN
  Administered 2021-11-16: 4 mg via INTRAVENOUS

## 2021-11-16 MED ORDER — ACETAMINOPHEN 500 MG PO TABS: 1000.0000 mg | ORAL_TABLET | Freq: Once | ORAL | Status: DC | PRN

## 2021-11-16 MED ORDER — FENTANYL CITRATE (PF) 100 MCG/2ML IJ SOLN
INTRAMUSCULAR | Status: DC | PRN
  Administered 2021-11-16 (×3): 50 ug via INTRAVENOUS

## 2021-11-16 MED ORDER — FENTANYL CITRATE (PF) 100 MCG/2ML IJ SOLN
INTRAMUSCULAR | Status: AC
  Filled 2021-11-16: qty 2

## 2021-11-16 MED ORDER — FENTANYL CITRATE (PF) 100 MCG/2ML IJ SOLN
25.0000 ug | INTRAMUSCULAR | Status: DC | PRN
  Administered 2021-11-16: 50 ug via INTRAVENOUS

## 2021-11-16 MED ORDER — LIDOCAINE 2% (20 MG/ML) 5 ML SYRINGE
INTRAMUSCULAR | Status: AC
  Filled 2021-11-16: qty 5

## 2021-11-16 MED ORDER — FENTANYL CITRATE (PF) 250 MCG/5ML IJ SOLN
INTRAMUSCULAR | Status: DC | PRN
  Administered 2021-11-16: 100 ug via INTRAVENOUS

## 2021-11-16 MED ORDER — OXYCODONE HCL 5 MG/5ML PO SOLN: 5.0000 mg | Freq: Once | ORAL | Status: DC | PRN

## 2021-11-16 MED ORDER — SODIUM CHLORIDE 0.9 % IR SOLN
Status: DC | PRN
  Administered 2021-11-16: 1000 mL

## 2021-11-16 MED ORDER — MIDAZOLAM HCL 2 MG/2ML IJ SOLN
INTRAMUSCULAR | Status: AC
  Filled 2021-11-16: qty 2

## 2021-11-16 MED ORDER — PROPOFOL 10 MG/ML IV BOLUS
INTRAVENOUS | Status: DC | PRN
  Administered 2021-11-16: 60 mg via INTRAVENOUS
  Administered 2021-11-16: 140 mg via INTRAVENOUS

## 2021-11-16 MED ORDER — BUPIVACAINE HCL (PF) 0.25 % IJ SOLN
INTRAMUSCULAR | Status: AC
  Filled 2021-11-16: qty 30

## 2021-11-16 MED ORDER — ROCURONIUM BROMIDE 100 MG/10ML IV SOLN
INTRAVENOUS | Status: DC | PRN
  Administered 2021-11-16: 80 mg via INTRAVENOUS

## 2021-11-16 MED ORDER — CHLORHEXIDINE GLUCONATE 0.12 % MT SOLN
15.0000 mL | Freq: Once | OROMUCOSAL | Status: AC
  Administered 2021-11-16: 15 mL via OROMUCOSAL
  Filled 2021-11-16: qty 15

## 2021-11-16 MED ORDER — ROCURONIUM BROMIDE 10 MG/ML (PF) SYRINGE
PREFILLED_SYRINGE | INTRAVENOUS | Status: AC
  Filled 2021-11-16: qty 10

## 2021-11-16 MED ORDER — PROPOFOL 10 MG/ML IV BOLUS
INTRAVENOUS | Status: AC
  Filled 2021-11-16: qty 20

## 2021-11-16 MED ORDER — CIPROFLOXACIN IN D5W 400 MG/200ML IV SOLN
400.0000 mg | INTRAVENOUS | Status: DC
  Filled 2021-11-16: qty 200

## 2021-11-16 MED ORDER — BUPIVACAINE HCL 0.25 % IJ SOLN
INTRAMUSCULAR | Status: DC | PRN
  Administered 2021-11-16: 9 mL

## 2021-11-16 SURGICAL SUPPLY — 40 items
BAG COUNTER SPONGE SURGICOUNT (BAG) ×2 IMPLANT
CANISTER SUCT 3000ML PPV (MISCELLANEOUS) ×2 IMPLANT
CHLORAPREP W/TINT 26 (MISCELLANEOUS) ×2 IMPLANT
CLIP LIGATING HEMO O LOK GREEN (MISCELLANEOUS) ×2 IMPLANT
COVER SURGICAL LIGHT HANDLE (MISCELLANEOUS) ×2 IMPLANT
COVER TRANSDUCER ULTRASND (DRAPES) ×2 IMPLANT
DERMABOND ADVANCED (GAUZE/BANDAGES/DRESSINGS) ×1
DERMABOND ADVANCED .7 DNX12 (GAUZE/BANDAGES/DRESSINGS) ×1 IMPLANT
ELECT REM PT RETURN 9FT ADLT (ELECTROSURGICAL) ×2
ELECTRODE REM PT RTRN 9FT ADLT (ELECTROSURGICAL) ×1 IMPLANT
GLOVE SURG SYN 7.5  E (GLOVE) ×2
GLOVE SURG SYN 7.5 E (GLOVE) ×1 IMPLANT
GLOVE SURG SYN 7.5 PF PI (GLOVE) ×1 IMPLANT
GOWN STRL REUS W/ TWL LRG LVL3 (GOWN DISPOSABLE) ×2 IMPLANT
GOWN STRL REUS W/ TWL XL LVL3 (GOWN DISPOSABLE) ×1 IMPLANT
GOWN STRL REUS W/TWL LRG LVL3 (GOWN DISPOSABLE) ×4
GOWN STRL REUS W/TWL XL LVL3 (GOWN DISPOSABLE) ×2
GRASPER SUT TROCAR 14GX15 (MISCELLANEOUS) ×2 IMPLANT
KIT BASIN OR (CUSTOM PROCEDURE TRAY) ×2 IMPLANT
KIT TURNOVER KIT B (KITS) ×2 IMPLANT
NDL INSUFFLATION 14GA 120MM (NEEDLE) ×1 IMPLANT
NEEDLE INSUFFLATION 14GA 120MM (NEEDLE) ×2 IMPLANT
NS IRRIG 1000ML POUR BTL (IV SOLUTION) ×2 IMPLANT
PAD ARMBOARD 7.5X6 YLW CONV (MISCELLANEOUS) ×2 IMPLANT
POUCH LAPAROSCOPIC INSTRUMENT (MISCELLANEOUS) ×2 IMPLANT
POUCH RETRIEVAL ECOSAC 10 (ENDOMECHANICALS) IMPLANT
POUCH RETRIEVAL ECOSAC 10MM (ENDOMECHANICALS)
SCISSORS LAP 5X35 DISP (ENDOMECHANICALS) ×2 IMPLANT
SET IRRIG TUBING LAPAROSCOPIC (IRRIGATION / IRRIGATOR) ×2 IMPLANT
SET TUBE SMOKE EVAC HIGH FLOW (TUBING) ×2 IMPLANT
SLEEVE ENDOPATH XCEL 5M (ENDOMECHANICALS) ×2 IMPLANT
SPECIMEN JAR SMALL (MISCELLANEOUS) ×2 IMPLANT
SUT MNCRL AB 4-0 PS2 18 (SUTURE) ×2 IMPLANT
TOWEL GREEN STERILE (TOWEL DISPOSABLE) ×2 IMPLANT
TOWEL GREEN STERILE FF (TOWEL DISPOSABLE) ×2 IMPLANT
TRAY LAPAROSCOPIC MC (CUSTOM PROCEDURE TRAY) ×2 IMPLANT
TROCAR XCEL NON-BLD 11X100MML (ENDOMECHANICALS) ×2 IMPLANT
TROCAR XCEL NON-BLD 5MMX100MML (ENDOMECHANICALS) ×2 IMPLANT
WARMER LAPAROSCOPE (MISCELLANEOUS) ×2 IMPLANT
WATER STERILE IRR 1000ML POUR (IV SOLUTION) ×2 IMPLANT

## 2021-11-16 NOTE — Discharge Instructions (Signed)
[facility] ______[facility] LAPAROSCOPIC SURGERY: POST OP INSTRUCTIONS Always review your discharge instruction sheet given to you by the facility where your surgery was performed. IF YOU HAVE DISABILITY OR FAMILY LEAVE FORMS, YOU MUST BRING THEM TO THE OFFICE FOR PROCESSING.   DO NOT GIVE THEM TO YOUR DOCTOR.  A prescription for pain medication may be given to you upon discharge.  Take your pain medication as prescribed, if needed.  If narcotic pain medicine is not needed, then you may take acetaminophen (Tylenol) or ibuprofen (Advil) as needed. Take your usually prescribed medications unless otherwise directed. If you need a refill on your pain medication, please contact your pharmacy.  They will contact our office to request authorization. Prescriptions will not be filled after 5pm or on week-ends. You should follow a light diet the first few days after arrival home, such as soup and crackers, etc.  Be sure to include lots of fluids daily. Most patients will experience some swelling and bruising in the area of the incisions.  Ice packs will help.  Swelling and bruising can take several days to resolve.  It is common to experience some constipation if taking pain medication after surgery.  Increasing fluid intake and taking a stool softener (such as Colace) will usually help or prevent this problem from occurring.  A mild laxative (Milk of Magnesia or Miralax) should be taken according to package instructions if there are no bowel movements after 48 hours. Unless discharge instructions indicate otherwise, you may remove your bandages 24-48 hours after surgery, and you may shower at that time.  You may have steri-strips (small skin tapes) in place directly over the incision.  These strips should be left on the skin for 7-10 days.  If your surgeon used skin glue on the incision, you may shower in 24 hours.  The glue will flake off over the next 2-3 weeks.  Any sutures or staples will be  removed at the office during your follow-up visit. ACTIVITIES:  You may resume regular (light) daily activities beginning the next day--such as daily self-care, walking, climbing stairs--gradually increasing activities as tolerated.  You may have sexual intercourse when it is comfortable.  Refrain from any heavy lifting or straining until approved by your doctor. You may drive when you are no longer taking prescription pain medication, you can comfortably wear a seatbelt, and you can safely maneuver your car and apply brakes. RETURN TO WORK:  __________________________________________________________ You should see your doctor in the office for a follow-up appointment approximately 2-3 weeks after your surgery.  Make sure that you call for this appointment within a day or two after you arrive home to insure a convenient appointment time. OTHER INSTRUCTIONS: __________________________________________________________________________________________________________________________ __________________________________________________________________________________________________________________________ WHEN TO CALL YOUR DOCTOR: Fever over 101.0 Inability to urinate Continued bleeding from incision. Increased pain, redness, or drainage from the incision. Increasing abdominal pain  The clinic staff is available to answer your questions during regular business hours.  Please don't hesitate to call and ask to speak to one of the nurses for clinical concerns.  If you have a medical emergency, go to the nearest emergency room or call 911.  A surgeon from [facility] is always on call at the hospital. [address], [address], [place], [place]  [address] ? [address] [address], [place], [place]   [address] [phone] ? [phone] ? FAX [phone] Web site: [email or web address]  

## 2021-11-16 NOTE — Anesthesia Procedure Notes (Signed)
Procedure Name: Intubation Date/Time: 11/16/2021 9:41 AM Performed by: Gwyndolyn Saxon, CRNA Pre-anesthesia Checklist: Patient identified, Emergency Drugs available, Suction available and Patient being monitored Patient Re-evaluated:Patient Re-evaluated prior to induction Oxygen Delivery Method: Circle system utilized Preoxygenation: Pre-oxygenation with 100% oxygen Induction Type: IV induction Ventilation: Mask ventilation without difficulty Laryngoscope Size: Mac and 3 Grade View: Grade II Tube type: Oral Tube size: 7.0 mm Number of attempts: 2 Airway Equipment and Method: Patient positioned with wedge pillow and Stylet Placement Confirmation: ETT inserted through vocal cords under direct vision, positive ETCO2 and breath sounds checked- equal and bilateral Secured at: 21 cm Tube secured with: Tape Dental Injury: Teeth and Oropharynx as per pre-operative assessment  Comments: DL #1 by paramedic student with esophageal intubation

## 2021-11-16 NOTE — Anesthesia Postprocedure Evaluation (Signed)
Anesthesia Post Note  Patient: Amanda Wilkins  Procedure(s) Performed: [place]     Patient location during evaluation: PACU Anesthesia Type: General Level of consciousness: awake and alert Pain management: pain level controlled Vital Signs Assessment: post-procedure vital signs reviewed and stable Respiratory status: spontaneous breathing, nonlabored ventilation, respiratory function stable and patient connected to nasal cannula oxygen Cardiovascular status: blood pressure returned to baseline and stable Postop Assessment: no apparent nausea or vomiting Anesthetic complications: no   No notable events documented.  Last Vitals:  Vitals:   11/16/21 1122 11/16/21 1133  BP: (!) 133/96 (!) 135/99  Pulse: 71 72  Resp: 19 18  Temp:  36.7 C  SpO2: 100% 92%    Last Pain:  Vitals:   11/16/21 1133  TempSrc:   PainSc: 0-No pain                 Gelsey Amyx

## 2021-11-16 NOTE — Op Note (Signed)
11/16/2021  10:19 AM  PATIENT:  Amanda Wilkins  76 y.o. female  PRE-OPERATIVE DIAGNOSIS:  CHRONIC CHOLECYSTITIS  POST-OPERATIVE DIAGNOSIS:  CHRONIC CHOLECYSTITIS, CHOLELITHIASIS  PROCEDURE:  Procedure(s): LAPAROSCOPIC CHOLECYSTECTOMY (N/A)  SURGEON:  Surgeon(s) and Role:    * Ralene Ok, MD - Primary  ASSISTANTS: Yvone Neu MD, PGY-6   ANESTHESIA:   local and general  EBL:  5 mL   BLOOD ADMINISTERED:none  DRAINS: none   LOCAL MEDICATIONS USED:  MARCAINE     SPECIMEN:  Source of Specimen:  GALLBLADDER  DISPOSITION OF SPECIMEN:  PATHOLOGY  COUNTS:  YES  TOURNIQUET:  * No tourniquets in log *  DICTATION: .Dragon Dictation  The patient was taken to the operating and placed in the supine position with bilateral SCDs in place.  The patient was prepped and draped in the usual sterile fashion. A time out was called and all facts were verified. A pneumoperitoneum was obtained via A Veress needle technique to a pressure of 73mm of mercury.  A 45mm trochar was then placed in the right upper quadrant under visualization, and there were no injuries to any abdominal organs. A 11 mm port was then placed in the umbilical region after infiltrating with local anesthesia under direct visualization. A second and third epigastric port and right lower quadrant port placement under direct visualization, respectively.    The gallbladder was identified and retracted, the peritoneum was then sharply dissected from the gallbladder and this dissection was carried down to Calot's triangle. The cystic duct was identified and stripped away circumferentially and seen going into the gallbladder 360, the critical angle was obtained.  2 clips were placed proximally one distally and the cystic duct transected. The cystic artery was identified and 2 clips placed proximally and one distally and transected.  We then proceeded to remove the gallbladder off the hepatic fossa with Bovie cautery. A  retrieval bag was then placed in the abdomen and gallbladder placed in the bag. The hepatic fossa was then reexamined and hemostasis was achieved with Bovie cautery and was excellent at the end of the case. There was some spillage of the bile that was suctioned and irrigated.  The subhepatic fossa and perihepatic fossa was then irrigated until the effluent was clear.  The gallbladder and bag were removed from the abdominal cavity. The 11 mm trocar fascia was reapproximated with the Endo Close #1 Vicryl x2.  The pneumoperitoneum was evacuated and all trochars removed under direct visulalization.  The skin was then closed with 4-0 Monocryl and the skin dressed with Dermabond.    The patient was awaken from general anesthesia and taken to the recovery room in stable condition.   PLAN OF CARE: Discharge to home after PACU  PATIENT DISPOSITION:  PACU - hemodynamically stable.   Delay start of Pharmacological VTE agent (>24hrs) due to surgical blood loss or risk of bleeding: not applicable

## 2021-11-16 NOTE — Transfer of Care (Signed)
Immediate Anesthesia Transfer of Care Note  Patient: Amanda Wilkins  Procedure(s) Performed: LAPAROSCOPIC CHOLECYSTECTOMY  Patient Location: PACU  Anesthesia Type:General  Level of Consciousness: drowsy and patient cooperative  Airway & Oxygen Therapy: Patient Spontanous Breathing and Patient connected to face mask oxygen  Post-op Assessment: Report given to RN and Post -op Vital signs reviewed and stable  Post vital signs: Reviewed and stable  Last Vitals:  Vitals Value Taken Time  BP 150/86 11/16/21 1034  Temp    Pulse 66 11/16/21 1039  Resp 20 11/16/21 1039  SpO2 100 % 11/16/21 1039  Vitals shown include unvalidated device data.  Last Pain:  Vitals:   11/16/21 0713  TempSrc:   PainSc: 0-No pain         Complications: No notable events documented.

## 2021-11-16 NOTE — H&P (Signed)
Chief Complaint: new problem       History of Present Illness: Amanda Wilkins is a 76 y.o. female who is seen today as an office consultation at the request of Dr. Pasty Arch for evaluation of new problem .   Patient is a 76 year old female who follows back up after being seen at [facility] for acute cholecystitis.  Patient also with a complicated cardiac history which required ongoing antiplatelet therapy.  Patient subsequently is followed up with cardiology.  The goal was to have her on flecainide antiplatelet therapy for at least 1 month postconversion.  Patient has been 1 month from admission on 1220.   Patient's had no previous abdominal pain or discomfort.  She has had some reflux.       She had a previous C-section in the past.       Review of Systems: A complete review of systems was obtained from the patient.  I have reviewed this information and discussed as appropriate with the patient.  See HPI as well for other ROS.   Review of Systems  Constitutional: Negative for fever.  HENT: Negative for congestion.   Eyes: Negative for blurred vision.  Respiratory: Negative for cough, shortness of breath and wheezing.   Cardiovascular: Negative for chest pain and palpitations.  Gastrointestinal: Positive for heartburn. Negative for abdominal pain, nausea and vomiting.  Genitourinary: Negative for dysuria.  Musculoskeletal: Negative for myalgias.  Skin: Negative for rash.  Neurological: Negative for dizziness and headaches.  Psychiatric/Behavioral: Negative for depression and suicidal ideas.  All other systems reviewed and are negative.       Medical History: Past Medical History Past Medical History: Diagnosis        Date            Arrhythmia                    Arthritis                         Sleep apnea            There is no problem list on file for this patient.     Past Surgical History Past Surgical History: Procedure       Laterality          Date            csection surgery                                    right knee replacement surgery                                    right shoulder tendon surgery N/A                   torn miniscus left knee            N/A              Allergies Allergies Allergen           Reactions            Epinephrine     Anaphylaxis and Unknown            Epinephrine Base (Refill)        Other (See Comments)  Seriously increases heart rate            Adhesive         Other (See Comments)                           Blisters from adhesive tape            Aspirin Other (See Comments)                           Can take the coated 325 mg. Plain asa 325 mg speeds up the heart. Can take the coated 325 mg. Plain asa 325 mg speeds up the heart.              Covid-19 Vaccine, Ad26.Cov2.S (Janssen)   Hives                            hives 6 hrs after her COVID 19 2nd booster - resolved            Diphenhydramine        Unknown                           Increased heart rate            Hylan G-F 20   Other (See Comments)                           Very painful (knee injection) Very painful (knee injection)              Other   Hives                           Was imitation crab meat and broke out in hives, had skin testing for lobster and showed allergic            Penicillins        Other (See Comments) and Unknown                           Pt previously has been told not to take because of family history of reactions (water blisters). She took amoxicillin in 2012-2013 with no reaction Pt previously has been told not to take because of family history of reactions (water blisters). She took amoxicillin in 2012-2013 with no reaction         Current Outpatient Medications on File Prior to Visit Medication       Sig       Dispense         Refill            ALPRAZolam (XANAX) 0.25 MG tablet         Take 1 tablet (0.25 mg total) by mouth 2 (two) times daily as needed                            apixaban (ELIQUIS) 5 mg tablet        Take 1 tablet (5 mg total) by mouth                              famotidine (PEPCID)  20 MG tablet    Take by mouth                                      flecainide (TAMBOCOR) 50 MG tablet                                                 levalbuterol (XOPENEX HFA) inhaler            Inhale into the lungs                              promethazine-dextromethorphan (PROMETHAZINE-DM) 6.25-15 mg/5 mL syrup Take by mouth                                  sertraline (ZOLOFT) 50 MG tablet     1 tablet (50 mg total)                             tiZANidine (ZANAFLEX) 4 MG tablet 1 tablet (4 mg total) as needed                                     atenoloL (TENORMIN) 50 MG tablet 1 tablet (50 mg total)                             FUROsemide (LASIX) 20 MG tablet  1 tablet (20 mg total)                             hydrocodone-homatropine (HYCODAN) 5-1.5 mg/5 mL syrup         Take 5 mLs by mouth every 6 (six) hours as needed                                 hydrOXYchloroQUINE (PLAQUENIL) 200 mg tablet 2 tabs                            metoprolol tartrate (LOPRESSOR) 25 MG tablet                                             multivitamin capsule   Take 1 capsule by mouth once daily                            rosuvastatin (CRESTOR) 5 MG tablet           1 tablet (5 mg total)                      No current facility-administered medications on file prior to visit.     Family History Family History Problem  Relation           Age of Onset            Obesity            Mother              High blood pressure (Hypertension)   Mother              High blood pressure (Hypertension)   Father               Heart valve disease    Father               High blood pressure (Hypertension)   Sister                Breast cancer  Sister                High blood pressure (Hypertension)   Brother                    Social History   Tobacco Use Smoking Status            Never Smokeless Tobacco   Never     Social History Social History     Socioeconomic History            Marital status:  Married Tobacco Use            Smoking status:          Never            Smokeless tobacco:    Never Vaping Use            Vaping Use:    Never used Substance and Sexual Activity            Alcohol use:    Never            Drug use:        Never       Objective:     Vitals:              09/25/21 0940 BP:      138/72 Pulse:  82 Temp:  36.4 C (97.6 F) SpO2:  97% Weight:            (!) 105.4 kg (232 lb 6.4 oz) Height: 165.1 cm (5\' 5" )   Body mass index is 38.67 kg/m.   Physical Exam Constitutional:      Appearance: Normal appearance.  HENT:     Head: Normocephalic and atraumatic.     Mouth/Throat:     Mouth: Mucous membranes are moist.     Pharynx: Oropharynx is clear.  Eyes:     General: No scleral icterus.    Pupils: Pupils are equal, round, and reactive to light.  Cardiovascular:     Rate and Rhythm: Normal rate and regular rhythm.     Pulses: Normal pulses.     Heart sounds: No murmur heard.   No friction rub. No gallop.  Pulmonary:     Effort: Pulmonary effort is normal. No respiratory distress.     Breath sounds: Normal breath sounds. No stridor.  Abdominal:     General: Abdomen is flat.  Musculoskeletal:        General: No swelling.  Skin:    General: Skin is warm.  Neurological:     General: No focal deficit present.     Mental Status: She is alert and oriented to person,  place, and time. Mental status is at baseline.  Psychiatric:        Mood and Affect: Mood normal.        Thought Content: Thought content normal.        Judgment: Judgment normal.        Assessment and Plan: Diagnoses and all orders for this visit:   Calculus of gallbladder with chronic cholecystitis without obstruction   Chronic a-fib (CMS-HCC)     Amanda Wilkins is a 76 y.o. female    1.          We will proceed to the OR for a lap  cholecystectomy. 2.         All risks and benefits were discussed with the patient to generally include: infection, bleeding, possible need for post op ERCP, damage to the bile ducts, and bile leak. Alternatives were offered and described.  All questions were answered and the patient voiced understanding of the procedure and wishes to proceed at this point with a laparoscopic cholecystectomy           No follow-ups on file.   Ralene Ok, MD, [facility], [facility]

## 2021-11-17 ENCOUNTER — Encounter (HOSPITAL_COMMUNITY): Payer: Self-pay | Admitting: General Surgery

## 2021-11-17 LAB — SURGICAL PATHOLOGY

## 2021-11-22 ENCOUNTER — Ambulatory Visit (INDEPENDENT_AMBULATORY_CARE_PROVIDER_SITE_OTHER): Payer: Medicare Other | Admitting: Internal Medicine

## 2021-11-22 ENCOUNTER — Encounter: Payer: Self-pay | Admitting: Internal Medicine

## 2021-11-22 ENCOUNTER — Other Ambulatory Visit: Payer: Self-pay

## 2021-11-22 VITALS — BP 120/70 | HR 65 | Temp 98.5°F | Ht 65.0 in | Wt 233.2 lb

## 2021-11-22 DIAGNOSIS — I10 Essential (primary) hypertension: Secondary | ICD-10-CM

## 2021-11-22 DIAGNOSIS — M06072 Rheumatoid arthritis without rheumatoid factor, left ankle and foot: Secondary | ICD-10-CM | POA: Diagnosis not present

## 2021-11-22 DIAGNOSIS — I4819 Other persistent atrial fibrillation: Secondary | ICD-10-CM | POA: Diagnosis not present

## 2021-11-22 DIAGNOSIS — K810 Acute cholecystitis: Secondary | ICD-10-CM | POA: Diagnosis not present

## 2021-11-22 DIAGNOSIS — E871 Hypo-osmolality and hyponatremia: Secondary | ICD-10-CM

## 2021-11-22 DIAGNOSIS — I480 Paroxysmal atrial fibrillation: Secondary | ICD-10-CM | POA: Diagnosis not present

## 2021-11-22 DIAGNOSIS — D6489 Other specified anemias: Secondary | ICD-10-CM | POA: Diagnosis not present

## 2021-11-22 DIAGNOSIS — M06071 Rheumatoid arthritis without rheumatoid factor, right ankle and foot: Secondary | ICD-10-CM

## 2021-11-22 MED ORDER — TRAMADOL HCL 50 MG PO TABS: 25.0000 mg | ORAL_TABLET | Freq: Four times a day (QID) | ORAL | 3 refills | Status: AC | PRN

## 2021-11-22 NOTE — Assessment & Plan Note (Addendum)
Worse.  It has been a very chronic problem.  We discussed multiple potential etiologies for low sodium including chronic pain, opioid use, etc. °We will reduce water intake some.  Monitor c-Met °Tramadol may help w/RA pain - monitor Na °

## 2021-11-22 NOTE — Assessment & Plan Note (Signed)
On Atenolol 

## 2021-11-22 NOTE — Assessment & Plan Note (Signed)
S/p chole 2/23 Doing well

## 2021-11-22 NOTE — Assessment & Plan Note (Signed)
Continue with Eliquis, Atenolol, flecainide  

## 2021-11-22 NOTE — Assessment & Plan Note (Addendum)
Tramadol prn offered - Rx given  Potential benefits of a long term opioids use as well as potential risks (i.e. addiction risk, apnea etc) and complications (i.e. Somnolence, constipation and others) were explained to the patient and were aknowledged.   Worse pain.  Pain from arthritis was very well controlled on tramadol when she took it for pain postoperatively.  It has been a very chronic problem.  We discussed multiple potential etiologies for low sodium including chronic pain, opioid use, etc. We will reduce water intake some.  Monitor c-Met Tramadol should help w/RA pain - monitor Na

## 2021-11-22 NOTE — Progress Notes (Signed)
Subjective:  Patient ID: Amanda Wilkins, female    DOB: 09/12/1946  Age: 76 y.o. MRN: [id number]  CC: discuss results   HPI Amanda Wilkins presents for post-chole visit. F/u on A fib, anxiety. RA pain was much better w/Tramadol given post-op C/o mild anemia, low Na of 129  Outpatient Medications Prior to Visit  Medication Sig Dispense Refill   acetaminophen (TYLENOL) 500 MG tablet Take 1,000 mg by mouth daily as needed (pain).     ALPRAZolam (XANAX) 0.25 MG tablet Take 1 tablet (0.25 mg total) by mouth 2 (two) times daily as needed for anxiety. 60 tablet 1   amLODipine (NORVASC) 2.5 MG tablet Take 0.5 tablets (1.25 mg total) by mouth daily. 90 tablet 3   apixaban (ELIQUIS) 5 MG TABS tablet Take 1 tablet (5 mg total) by mouth 2 (two) times daily. 60 tablet 5   Biotin 1000 MCG tablet Take 1,000 mcg by mouth in the morning.     cholecalciferol (VITAMIN D3) 25 MCG (1000 UT) tablet Take 1,000 Units by mouth daily.     EPINEPHrine 0.3 mg/0.3 mL IJ SOAJ injection Inject 0.3 mg into the muscle as needed for anaphylaxis. As needed for life-threatening allergic reactions (Patient taking differently: Inject 0.3 mg into the muscle once as needed for anaphylaxis.) 2 each 1   flecainide (TAMBOCOR) 50 MG tablet Take 1.5 tablets (75 mg total) by mouth 2 (two) times daily. 135 tablet 3   hydroxychloroquine (PLAQUENIL) 200 MG tablet Take 400 mg by mouth every morning.     metoprolol tartrate (LOPRESSOR) 25 MG tablet Take 1 tablet (25 mg total) by mouth 2 (two) times daily. 30 tablet 11   Multiple Vitamin (MULTIVITAMIN WITH MINERALS) TABS tablet Take 1 tablet by mouth daily. Centrum for Women 50+     Probiotic Product (ALIGN) 4 MG CAPS Take 4 mg by mouth daily.     rosuvastatin (CRESTOR) 5 MG tablet Take 1 tablet (5 mg total) by mouth daily. 90 tablet 3   sertraline (ZOLOFT) 50 MG tablet TAKE 1 TABLET BY MOUTH DAILY 90 tablet 1   traMADol (ULTRAM) 50 MG tablet Take 1 tablet (50 mg total) by mouth  every 6 (six) hours as needed. 20 tablet 0   No facility-administered medications prior to visit.    ROS: Review of Systems  Constitutional:  Positive for fatigue. Negative for activity change, appetite change, chills and unexpected weight change.  HENT:  Negative for congestion, mouth sores and sinus pressure.   Eyes:  Negative for visual disturbance.  Respiratory:  Negative for cough and chest tightness.   Cardiovascular:  Negative for chest pain and palpitations.  Gastrointestinal:  Positive for abdominal pain. Negative for nausea.  Genitourinary:  Negative for difficulty urinating, frequency, urgency and vaginal pain.  Musculoskeletal:  Positive for arthralgias, back pain and gait problem.  Skin:  Negative for pallor and rash.  Neurological:  Negative for dizziness, tremors, weakness, numbness and headaches.  Hematological:  Bruises/bleeds easily.  Psychiatric/Behavioral:  Negative for confusion, sleep disturbance and suicidal ideas.    Objective:  BP 120/70 (BP Location: Left Arm, Patient Position: Sitting, Cuff Size: Large)    Pulse 65    Temp 98.5 F (36.9 C) (Oral)    Ht 5' 5"  (1.651 m)    Wt 233 lb 3.2 oz (105.8 kg)    SpO2 99%    BMI 38.81 kg/m   BP Readings from Last 3 Encounters:  11/22/21 120/70  11/16/21 (!) 135/99  09/27/21 138/70  Wt Readings from Last 3 Encounters:  11/22/21 233 lb 3.2 oz (105.8 kg)  11/16/21 235 lb (106.6 kg)  09/27/21 232 lb 3.2 oz (105.3 kg)    Physical Exam Constitutional:      General: She is not in acute distress.    Appearance: She is well-developed. She is obese.  HENT:     Head: Normocephalic.     Right Ear: External ear normal.     Left Ear: External ear normal.     Nose: Nose normal.  Eyes:     General:        Right eye: No discharge.        Left eye: No discharge.     Conjunctiva/sclera: Conjunctivae normal.     Pupils: Pupils are equal, round, and reactive to light.  Neck:     Thyroid: No thyromegaly.     Vascular:  No JVD.     Trachea: No tracheal deviation.  Cardiovascular:     Rate and Rhythm: Normal rate and regular rhythm.     Heart sounds: Normal heart sounds.  Pulmonary:     Effort: No respiratory distress.     Breath sounds: No stridor. No wheezing.  Abdominal:     General: Bowel sounds are normal. There is no distension.     Palpations: Abdomen is soft. There is no mass.     Tenderness: There is abdominal tenderness. There is no guarding or rebound.  Musculoskeletal:        General: Tenderness present.     Cervical back: Normal range of motion and neck supple. No rigidity.  Lymphadenopathy:     Cervical: No cervical adenopathy.  Skin:    Coloration: Skin is not jaundiced.     Findings: Bruising present. No erythema or rash.  Neurological:     Cranial Nerves: No cranial nerve deficit.     Motor: No abnormal muscle tone.     Coordination: Coordination normal.     Gait: Gait abnormal.     Deep Tendon Reflexes: Reflexes normal.  Psychiatric:        Behavior: Behavior normal.        Thought Content: Thought content normal.        Judgment: Judgment normal.  Healing wounds LE w/o edema  Lab Results  Component Value Date   WBC 6.5 11/16/2021   HGB 12.1 11/16/2021   HCT 37.2 11/16/2021   PLT 196 11/16/2021   GLUCOSE 107 (H) 11/16/2021   CHOL 175 11/04/2020   TRIG 69 11/04/2020   HDL 102 11/04/2020   LDLCALC 60 11/04/2020   ALT 154 (H) 09/02/2021   AST 68 (H) 09/02/2021   NA 129 (L) 11/16/2021   K 4.2 11/16/2021   CL 98 11/16/2021   CREATININE 0.78 11/16/2021   BUN 15 11/16/2021   CO2 20 (L) 11/16/2021   TSH 2.380 07/05/2021   INR 1.0 09/01/2021   HGBA1C 5.8 (H) 01/30/2015    No results found.  Assessment & Plan:   Problem List Items Addressed This Visit     Acute cholecystitis    S/p chole 2/23 Doing well      Relevant Orders   CBC with Differential/Platelet   AF (paroxysmal atrial fibrillation) (HCC)    Normal sinus rhythm now      Anemia - Primary    Relevant Orders   Iron, TIBC and Ferritin Panel   Essential hypertension    On Atenolol      Relevant Orders   CBC  with Differential/Platelet   Hyponatremia    Worse.  It has been a very chronic problem.  We discussed multiple potential etiologies for low sodium including chronic pain, opioid use, etc. We will reduce water intake some.  Monitor c-Met Tramadol may help w/RA pain - monitor Na      Relevant Orders   Comprehensive metabolic panel   TSH   Persistent atrial fibrillation (HCC)    Continue with Eliquis, Atenolol, flecainide      Relevant Orders   CBC with Differential/Platelet   RA (rheumatoid arthritis) (HCC)    Tramadol prn offered - Rx given  Potential benefits of a long term opioids use as well as potential risks (i.e. addiction risk, apnea etc) and complications (i.e. Somnolence, constipation and others) were explained to the patient and were aknowledged.   Worse pain.  Pain from arthritis was very well controlled on tramadol when she took it for pain postoperatively.  It has been a very chronic problem.  We discussed multiple potential etiologies for low sodium including chronic pain, opioid use, etc. We will reduce water intake some.  Monitor c-Met Tramadol should help w/RA pain - monitor Na      Relevant Medications   traMADol (ULTRAM) 50 MG tablet   Other Relevant Orders   Iron, TIBC and Ferritin Panel      Meds ordered this encounter  Medications   traMADol (ULTRAM) 50 MG tablet    Sig: Take 0.5-1 tablets (25-50 mg total) by mouth every 6 (six) hours as needed for up to 5 days.    Dispense:  100 tablet    Refill:  3      Follow-up: Return in about 3 months (around 02/19/2022) for a follow-up visit.  [name], MD

## 2021-11-24 ENCOUNTER — Encounter: Payer: Self-pay | Admitting: Internal Medicine

## 2021-11-24 NOTE — Assessment & Plan Note (Signed)
Normal sinus rhythm now

## 2021-11-29 DIAGNOSIS — H01114 Allergic dermatitis of left upper eyelid: Secondary | ICD-10-CM | POA: Diagnosis not present

## 2021-11-29 DIAGNOSIS — H01111 Allergic dermatitis of right upper eyelid: Secondary | ICD-10-CM | POA: Diagnosis not present

## 2021-11-29 DIAGNOSIS — D692 Other nonthrombocytopenic purpura: Secondary | ICD-10-CM | POA: Diagnosis not present

## 2021-11-29 DIAGNOSIS — H01115 Allergic dermatitis of left lower eyelid: Secondary | ICD-10-CM | POA: Diagnosis not present

## 2021-11-29 DIAGNOSIS — L245 Irritant contact dermatitis due to other chemical products: Secondary | ICD-10-CM | POA: Diagnosis not present

## 2021-11-29 DIAGNOSIS — L821 Other seborrheic keratosis: Secondary | ICD-10-CM | POA: Diagnosis not present

## 2021-12-12 DIAGNOSIS — D1801 Hemangioma of skin and subcutaneous tissue: Secondary | ICD-10-CM | POA: Diagnosis not present

## 2021-12-12 DIAGNOSIS — M79644 Pain in right finger(s): Secondary | ICD-10-CM | POA: Diagnosis not present

## 2021-12-14 ENCOUNTER — Other Ambulatory Visit: Payer: Self-pay | Admitting: Orthopedic Surgery

## 2021-12-14 DIAGNOSIS — L03011 Cellulitis of right finger: Secondary | ICD-10-CM | POA: Diagnosis not present

## 2021-12-14 DIAGNOSIS — D1800 Hemangioma unspecified site: Secondary | ICD-10-CM

## 2021-12-18 ENCOUNTER — Encounter: Payer: Self-pay | Admitting: Podiatry

## 2021-12-18 ENCOUNTER — Ambulatory Visit (INDEPENDENT_AMBULATORY_CARE_PROVIDER_SITE_OTHER): Payer: Medicare Other | Admitting: Podiatry

## 2021-12-18 ENCOUNTER — Other Ambulatory Visit: Payer: Self-pay

## 2021-12-18 DIAGNOSIS — L600 Ingrowing nail: Secondary | ICD-10-CM

## 2021-12-18 NOTE — Patient Instructions (Signed)

## 2021-12-19 NOTE — Progress Notes (Signed)
Subjective:  ? ?Patient ID: Amanda Wilkins, female   DOB: 76 y.o.   MRN: [id number]  ? ?HPI ?Patient presents stating she has had a chronic painful ingrown toenail of her right big toe and its been hard for her to work with it.  Tries to trim it out and soak ? ? ?ROS ? ? ?   ?Objective:  ?Physical Exam  ?Neurovascular status intact incurvated right hallux lateral border painful when pressed no active redness no drainage noted ? ?   ?Assessment:  ?Ingrown toenail deformity right hallux lateral border with pain ? ?   ?Plan:  ?H&P reviewed condition and recommended correction allowing patient to sign consent form.  I infiltrated the right hallux 60 mg like a Marcaine mixture sterile prep done and using sterile instrumentation remove the lateral border exposed matrix applied phenol 3 applications 30 seconds followed by alcohol lavage sterile dressing gave instructions on soaks and to leave dressing on 24 hours but take it off earlier if any throbbing were to occur ?   ? ? ?

## 2021-12-20 ENCOUNTER — Ambulatory Visit (INDEPENDENT_AMBULATORY_CARE_PROVIDER_SITE_OTHER): Payer: Medicare Other | Admitting: Psychology

## 2021-12-20 ENCOUNTER — Ambulatory Visit: Payer: Medicare Other | Admitting: Psychology

## 2021-12-20 DIAGNOSIS — F331 Major depressive disorder, recurrent, moderate: Secondary | ICD-10-CM | POA: Diagnosis not present

## 2021-12-20 NOTE — Progress Notes (Signed)
[name] Initial Adult Intake  Name: Amanda Wilkins Date: 12/20/2021 MRN: [id number] DOB: 1946-09-17 PCP: [name], MD  Time spent: 2:00 - 2:55 PM  Guardian/Payee:  Self    Paperwork requested: Yes   Today I met with  Amanda Wilkins in remote video (WebEx) face-to-face individual psychotherapy as an accomodation to the COVID-19 Pandemic.  Distance Site: Client's Home Orginating Site: Dr Jannifer Franklin Remote Office Consent: Obtained verbal consent to transmit  session remotely    Reason for Visit /Presenting Problem: Amanda Wilkins states that her husband has gone off his medication and it has made life significantly more stressful.  She states that the stress has effected her physical health.  She c/o that she is "very down and not in a good place."    Mental Status Exam: Appearance:   Neat     Behavior:  Appropriate  Motor:  Normal  Speech/Language:   NA  Affect:  Depressed  Mood:  depressed  Thought process:  normal  Thought content:    WNL  Sensory/Perceptual disturbances:    WNL  Orientation:  oriented to person, place, time/date, and situation  Attention:  Good  Concentration:  Good  Memory:  WNL  Fund of knowledge:   Good  Insight:    Good  Judgment:   Good  Impulse Control:  Good    Reported Symptoms:  Sad mood, persistent negative thinking, multiple somatic c/o, chronic pain, fatigued,   Risk Assessment: Danger to Self:  No Self-injurious Behavior: No Danger to Others: No Duty to Warn:no Physical Aggression / Violence:No  Access to Firearms a concern: No  Gang Involvement:No  Patient / guardian was educated about steps to take if suicide or homicide risk level increases between visits: no While future psychiatric events cannot be accurately predicted, the patient does not currently require acute inpatient psychiatric care and does not currently meet [facility] involuntary commitment criteria.  Substance Abuse  History: Current substance abuse:  No substance abuse hx, however pt has been known to over use alcohol at times in the past     Past Psychiatric History:   Previous psychological history is significant for depression Outpatient Providers: Royetta Crochet, Ph.D. ([facility]) History of Psych Hospitalization: No  Psychological Testing: Mood:  BDI   Abuse History:  Victim of: No.,  n/a    Report needed: No. Victim of Neglect:No. Perpetrator of  n/a   Witness / Exposure to Domestic Violence:  n/a   Protective Services Involvement:  n/a Witness to [employer] Violence:   n/a  Family History:  Family History  Problem Relation Age of Onset   Pancreatic cancer Brother    Lymphoma Mother    Prostate cancer Father        metastatic prostate cancer   Breast cancer Sister 22   Allergic rhinitis Sister    Urticaria Sister    Asthma Neg Hx    Eczema Neg Hx    Immunodeficiency Neg Hx    Atopy Neg Hx    Angioedema Neg Hx     Living situation: the patient lives with their spouse  Sexual Orientation: Straight  Relationship Status: married  Name of spouse / other: [employer] Public relations account executive (78)  If a parent, number of children / ages: 3  Support Systems: friends  Museum/gallery curator Stress:  No   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 History of legal issue / charges:  n/a  Medical History/Surgical History: reviewed Past Medical History:  Diagnosis Date    Acute bronchitis 09/11/2016   11/17 refractory   Acute cystitis without hematuria 05/17/2015   Acute kidney injury [facility])    Labs today   Acute right ankle pain 09/09/2018   Anticoagulant long-term use    Xarelto   Axillary pain    Bronchitis    Cellulitis    Cholecystitis    Chronic interstitial nephritis    CONJUNCTIVITIS, ACUTE 10/18/2010   Qualifier: Diagnosis of  By: Plotnikov MD, Evie Lacks    D-dimer, elevated    Depression    Eye inflammation    GERD (gastroesophageal reflux disease)    History of interstitial nephritis 2016   chronic   History of kidney stones    has a small one found on xray   History of nuclear stress test 12/16/2014   Intermediate risk nuclear study w/ medium size moderate severity reversible defect in the basal and mid inferolateral and inferior wall (per dr cardiology note , dr [name] did not think this was consistent with ischemia)/  normal LV function and wall motion, ef 75%   History of septic shock 01/22/2015   in setting Group A Strep Cellulits erysipelas/chest wall induration with streptoccocus basterium-- Severe sepsis, DIC, Acute respiratory failure with pulmonary edema, Acute Kidney failure with chronic interstitial nephritis   Hypertension    Hyponatremia    Migraines    on Zoloft for migraines   Mild carotid artery disease ([place])    per duplex 08-30-2017 bilateral ICA 1-39%   OA (osteoarthritis) rheumotologist-  dr [name]   both knees,  shoulders, ankles   OSA on CPAP     moderate obstructive sleep apnea with an AHI of 23.4/h and oxygen desaturations as low as 84%.  Now on CPAP at 12 cm H2O.   PAF (paroxysmal atrial fibrillation) [facility]) 2009   cardiologist-- dr [name]   Paroxysmal atrial flutter [facility])    a. dx 11/2017.   PSVT (paroxysmal supraventricular tachycardia) ([place])    Wears contact lenses     Past Surgical History:  Procedure Laterality Date   BREAST BIOPSY Right 05/15/2017   [place]   CARDIOVERSION N/A  08/28/2021   Procedure: CARDIOVERSION;  Surgeon: Fay Records, MD;  Location: [facility] ENDOSCOPY;  Service: Cardiovascular;  Laterality: N/A;   [place] N/A 11/16/2021   Procedure: LAPAROSCOPIC CHOLECYSTECTOMY;  Surgeon: Ralene Ok, MD;  Location: [place];  Service: General;  Laterality: N/A;   ESOPHAGOGASTRODUODENOSCOPY N/A 02/08/2015   Procedure: ESOPHAGOGASTRODUODENOSCOPY (EGD);  Surgeon: [name], MD;  Location: [place];  Service: Endoscopy;  Laterality: N/A;   KNEE ARTHROSCOPY W/ MENISCAL REPAIR Left 08/2014    @[facility]    SHOULDER SURGERY Right 04/04/2016   TOTAL KNEE ARTHROPLASTY Right 09/01/2018   Procedure: RIGHT TOTAL KNEE ARTHROPLASTY;  Surgeon: [name], MD;  [facility];  Service: Orthopedics;  Laterality: Right;    Medications: Current Outpatient Medications  Medication Sig Dispense Refill   acetaminophen (TYLENOL) 500 MG tablet Take 1,000 mg  by mouth daily as needed (pain).     ALPRAZolam (XANAX) 0.25 MG tablet Take 1 tablet (0.25 mg total) by mouth 2 (two) times daily as needed for anxiety. 60 tablet 1   amLODipine (NORVASC) 2.5 MG tablet Take 0.5 tablets (1.25 mg total) by mouth daily. 90 tablet 3   Biotin 1000 MCG tablet Take 1,000 mcg by mouth in the morning.     cholecalciferol (VITAMIN D3) 25 MCG (1000 UT) tablet Take 1,000 Units by mouth daily.     clindamycin (CLEOCIN T) 1 % lotion Apply 1 application. topically daily.     clobetasol (TEMOVATE) 0.05 % external solution      desonide (DESOWEN) 0.05 % ointment Apply topically.     DILT-XR 180 MG 24 hr capsule      EPINEPHrine 0.3 mg/0.3 mL IJ SOAJ injection Inject 0.3 mg into the muscle as needed for anaphylaxis. As needed for life-threatening allergic reactions (Patient taking differently: Inject 0.3 mg into the muscle once as needed for anaphylaxis.) 2 each 1   estradiol (VIVELLE-DOT) 0.025 MG/24HR SMARTSIG:1 T-DERMAL     finasteride (PROSCAR) 5 MG tablet      flecainide  (TAMBOCOR) 50 MG tablet Take 1.5 tablets (75 mg total) by mouth 2 (two) times daily. 135 tablet 3   fluocinonide cream (LIDEX) 2.70 % Apply 1 application. topically 2 (two) times daily.     glycopyrrolate (ROBINUL) 1 MG tablet      hydrocortisone 2.5 % ointment SMARTSIG:2 Topical Twice Daily     hydroxychloroquine (PLAQUENIL) 200 MG tablet Take 400 mg by mouth every morning.     medroxyPROGESTERone (PROVERA) 10 MG tablet      metoprolol tartrate (LOPRESSOR) 25 MG tablet Take 1 tablet (25 mg total) by mouth 2 (two) times daily. 30 tablet 11   Multiple Vitamin (MULTIVITAMIN WITH MINERALS) TABS tablet Take 1 tablet by mouth daily. Centrum for Women 50+     Probiotic Product (ALIGN) 4 MG CAPS Take 4 mg by mouth daily.     progesterone (PROMETRIUM) 200 MG capsule SMARTSIG:1 By Mouth     rosuvastatin (CRESTOR) 5 MG tablet Take 1 tablet (5 mg total) by mouth daily. 90 tablet 3   sertraline (ZOLOFT) 50 MG tablet TAKE 1 TABLET BY MOUTH DAILY 90 tablet 1   triamcinolone (KENALOG) 0.025 % ointment Apply topically.     warfarin (COUMADIN) 5 MG tablet      No current facility-administered medications for this visit.    Allergies  Allergen Reactions   Epinephrine Base Other (See Comments)    Seriously increases heart rate   Aspirin Other (See Comments)    Can take the coated 325 mg. Plain asa 325 mg speeds up the heart.   Benadryl [Diphenhydramine]     Increased heart rate   Covid-19 Ad26 Vaccine([place]) Hives     hives 6 hrs after her COVID 19 2nd booster - resolved    Fish Allergy Hives    Was imitation crab meat and broke out in hives, had skin testing for lobster and showed allergic   Hylan G-F 20 Other (See Comments)    Very painful (knee injection)   Penicillins Other (See Comments)    Pt previously has been told not to take because of family history of reactions (water blisters). She took amoxicillin in 2012-2013 with no reaction Pt received ancef on 11-16-2021 without issue   Tape Other  (See Comments)    Blisters from adhesive tape   Wound Dressing Adhesive  Other reaction(s): Other (See Comments) Blisters from adhesive tape    Diagnoses:  Moderate episode of recurrent major depressive disorder ([place])  Plan of Care:  It is thought that patient would greatly benefit from participating in a weekly individual psychotherapy. Treatment would focus on providing support, psychoeducation and assistance in understanding the psychological components that exacerbate her medical condition as well as gain a fuller understanding of depression. Patient would also benefit from a psychotherapy that focused on teaching new positive and more adaptive coping skills and strategies for managing her depression, anxiety and psychological repercussions of medical condition.  It is highly recommended that patient continue f/u with her PCP concerning her chronic pain (post hip surgery), RA and psychotropic medications.   Royetta Crochet, PhD

## 2021-12-21 ENCOUNTER — Telehealth: Payer: Self-pay | Admitting: Orthopaedic Surgery

## 2021-12-21 ENCOUNTER — Other Ambulatory Visit: Payer: Self-pay

## 2021-12-21 DIAGNOSIS — H01111 Allergic dermatitis of right upper eyelid: Secondary | ICD-10-CM | POA: Diagnosis not present

## 2021-12-21 DIAGNOSIS — H1045 Other chronic allergic conjunctivitis: Secondary | ICD-10-CM | POA: Diagnosis not present

## 2021-12-21 DIAGNOSIS — H01114 Allergic dermatitis of left upper eyelid: Secondary | ICD-10-CM | POA: Diagnosis not present

## 2021-12-21 DIAGNOSIS — M25551 Pain in right hip: Secondary | ICD-10-CM

## 2021-12-21 NOTE — Telephone Encounter (Signed)
Pt called requesting to go to physical therapy for chronic pains right hip. Pt is asking for a call to figure out what facility will fit her. Please call pt at [phone].  ?

## 2021-12-21 NOTE — Telephone Encounter (Signed)
Patient aware I sent referral to PT ? ?

## 2021-12-22 ENCOUNTER — Telehealth: Payer: Self-pay | Admitting: Orthopaedic Surgery

## 2021-12-22 NOTE — Telephone Encounter (Signed)
Patient called. She would like some pain medication called in for her. She did not sleep. Her call back number is [phone] ?

## 2021-12-22 NOTE — Telephone Encounter (Signed)
Don't think pain medication is indicated for her hip arthritis.  I'd hold for blackman or Artis Delay to see what they normally do.  Recommend Tylenol, ice/heat and not to take NSAIDs with her medical history

## 2021-12-24 ENCOUNTER — Encounter: Payer: Self-pay | Admitting: Internal Medicine

## 2021-12-25 ENCOUNTER — Ambulatory Visit (INDEPENDENT_AMBULATORY_CARE_PROVIDER_SITE_OTHER): Payer: Medicare Other

## 2021-12-25 ENCOUNTER — Encounter: Payer: Self-pay | Admitting: Physician Assistant

## 2021-12-25 ENCOUNTER — Telehealth: Payer: Self-pay | Admitting: Physician Assistant

## 2021-12-25 ENCOUNTER — Other Ambulatory Visit: Payer: Self-pay

## 2021-12-25 ENCOUNTER — Ambulatory Visit (INDEPENDENT_AMBULATORY_CARE_PROVIDER_SITE_OTHER): Payer: Medicare Other | Admitting: Physician Assistant

## 2021-12-25 ENCOUNTER — Other Ambulatory Visit: Payer: Self-pay | Admitting: Physician Assistant

## 2021-12-25 VITALS — Ht 65.0 in | Wt 237.4 lb

## 2021-12-25 DIAGNOSIS — M1611 Unilateral primary osteoarthritis, right hip: Secondary | ICD-10-CM
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
## 2021-12-25 NOTE — Telephone Encounter (Signed)
Pt asking for pain meds. She stated she has been in pain all weekend and knows really need meds. Pt knows she has an appt tomorrow but cant wait another day with her pains. Please call pt at [phone]. ?

## 2021-12-25 NOTE — Telephone Encounter (Signed)
Pt coming in today

## 2021-12-27 ENCOUNTER — Ambulatory Visit: Payer: Medicare Other | Admitting: Physician Assistant

## 2021-12-28 ENCOUNTER — Ambulatory Visit (INDEPENDENT_AMBULATORY_CARE_PROVIDER_SITE_OTHER): Payer: Medicare Other | Admitting: Psychology

## 2021-12-28 DIAGNOSIS — F331 Major depressive disorder, recurrent, moderate: Secondary | ICD-10-CM

## 2021-12-28 NOTE — Progress Notes (Signed)
? ?  PROGRESS NOTE ? ?Name: Amanda Wilkins ?Date: 12/28/2021 ?MRN: [id number] ?DOB: Sep 27, 1946 ?PCP: Plotnikov, Evie Lacks, MD ? ?Time spent: 1:00 - 1:55 PM ? ? ?Today I met with  [name] in remote video (WebEx) face-to-face individual psychotherapy as an accomodation to the COVID-19 Pandemic. ? ?Distance Site: Client's Home ?Orginating Site: Dr Jannifer Franklin Remote Office ?Consent: Obtained verbal consent to transmit  session remotely  ? ? ?Today in session, [name] and I d/ her goals for therapy and created her treatment plan.  [name] actively participated in the creation of this treatment plan.   ? ? ?Individualized Treatment Plan ?Strengths: bright, verbal, motivated  ?Supports: family  ? ?Goal/Needs for Treatment:  ?In order of importance to patient ?1) Increase sense of self confidence and agency ?2) Decrease co-dependent behavior and focus more on her own physical and mental health ?3) Decrease depression  ? ?Client Statement of Needs: wants to regain her confidence, not always focus on helping others instead of herself,   ? ?Treatment Level: Outpatient Individual Psychotherapy  ?Symptoms: socially w/d, frequent negative thinking, sad mood, loss of motivation,   ?Client Treatment Preferences: Previous couple's therapist  ? ?Healthcare consumer's goal for treatment:   ? ?Psychologist, Royetta Crochet, Ph. D. will support the patient's ability to achieve the goals identified. Psychoeducation, Cognitive Behavioral Therapy, Dialectical Behavioral Therapy, Motivational Interviewing, and other evidenced-based practices will be used to promote progress towards healthy functioning.  ? ?Healthcare consumer will: Actively participate in therapy, working towards healthy functioning.  ?  ?*Justification for Continuation/Discontinuation of Goal: R=Revised, O=Ongoing, A=Achieved, D=Discontinued ? ?Goal 1) Increase sense of self confidence and agency ? ?Target Date Goal Was reviewed Status Code Progress towards  goal/Likert rating  ?12/29/2022          O   ?     ?     ? ?Goal 2) Decrease co-dependent behavior and focus more on her own physical and mental health ? ?Target Date Goal Was reviewed Status Code Progress towards goal/Likert rating  ?12/29/2022           O   ?     ?     ? ?Goal 3) Decrease depression ? ?Target Date Goal Was reviewed Status Code Progress towards goal/Likert rating  ?12/29/2022            O   ?     ?     ? ?This plan has been reviewed and created by the following participants:  This plan will be reviewed at least every 12 months. ?Date Behavioral Health Clinician Date Guardian/Patient   ?12/28/2021 Royetta Crochet, Ph.D.  12/28/2021 [name]  ?     ?     ?     ?  ? ?Diagnoses:  ?Moderate episode of recurrent major depressive disorder ([place]) ? ? ?Royetta Crochet, PhD  ? ? ?

## 2021-12-30 ENCOUNTER — Other Ambulatory Visit: Payer: Self-pay

## 2021-12-30 ENCOUNTER — Other Ambulatory Visit: Payer: Self-pay | Admitting: Orthopedic Surgery

## 2021-12-30 ENCOUNTER — Ambulatory Visit
Admission: RE | Admit: 2021-12-30 | Discharge: 2021-12-30 | Disposition: A | Discharge status: Home / Self Care | Payer: Medicare Other | Source: Ambulatory Visit | Attending: Orthopedic Surgery | Admitting: Orthopedic Surgery

## 2021-12-30 DIAGNOSIS — D1800 Hemangioma unspecified site: Secondary | ICD-10-CM

## 2021-12-30 DIAGNOSIS — M19041 Primary osteoarthritis, right hand: Secondary | ICD-10-CM | POA: Diagnosis not present

## 2021-12-30 DIAGNOSIS — R600 Localized edema: Secondary | ICD-10-CM | POA: Diagnosis not present

## 2022-01-02 ENCOUNTER — Other Ambulatory Visit: Payer: Self-pay

## 2022-01-02 ENCOUNTER — Other Ambulatory Visit (INDEPENDENT_AMBULATORY_CARE_PROVIDER_SITE_OTHER): Payer: Medicare Other

## 2022-01-02 DIAGNOSIS — I10 Essential (primary) hypertension: Secondary | ICD-10-CM

## 2022-01-02 DIAGNOSIS — M1991 Primary osteoarthritis, unspecified site: Secondary | ICD-10-CM | POA: Diagnosis not present

## 2022-01-02 DIAGNOSIS — D6489 Other specified anemias: Secondary | ICD-10-CM

## 2022-01-02 DIAGNOSIS — I4819 Other persistent atrial fibrillation: Secondary | ICD-10-CM | POA: Diagnosis not present

## 2022-01-02 DIAGNOSIS — M06072 Rheumatoid arthritis without rheumatoid factor, left ankle and foot: Secondary | ICD-10-CM | POA: Diagnosis not present

## 2022-01-02 DIAGNOSIS — Z6838 Body mass index (BMI) 38.0-38.9, adult: Secondary | ICD-10-CM | POA: Diagnosis not present

## 2022-01-02 DIAGNOSIS — E669 Obesity, unspecified: Secondary | ICD-10-CM | POA: Diagnosis not present

## 2022-01-02 DIAGNOSIS — M06071 Rheumatoid arthritis without rheumatoid factor, right ankle and foot: Secondary | ICD-10-CM | POA: Diagnosis not present

## 2022-01-02 DIAGNOSIS — M792 Neuralgia and neuritis, unspecified: Secondary | ICD-10-CM | POA: Diagnosis not present

## 2022-01-02 DIAGNOSIS — E871 Hypo-osmolality and hyponatremia: Secondary | ICD-10-CM | POA: Diagnosis not present

## 2022-01-02 DIAGNOSIS — M0609 Rheumatoid arthritis without rheumatoid factor, multiple sites: Secondary | ICD-10-CM | POA: Diagnosis not present

## 2022-01-02 DIAGNOSIS — K810 Acute cholecystitis: Secondary | ICD-10-CM

## 2022-01-02 DIAGNOSIS — Z79899 Other long term (current) drug therapy: Secondary | ICD-10-CM | POA: Diagnosis not present

## 2022-01-02 LAB — COMPREHENSIVE METABOLIC PANEL
ALT: 20 U/L (ref 0–35)
AST: 21 U/L (ref 0–37)
Albumin: 4.5 g/dL (ref 3.5–5.2)
Alkaline Phosphatase: 64 U/L (ref 39–117)
BUN: 13 mg/dL (ref 6–23)
CO2: 24 mEq/L (ref 19–32)
Calcium: 9.6 mg/dL (ref 8.4–10.5)
Chloride: 97 mEq/L (ref 96–112)
Creatinine, Ser: 0.7 mg/dL (ref 0.40–1.20)
GFR: 84.81 mL/min (ref >60.00)
Glucose, Bld: 94 mg/dL (ref 70–99)
Potassium: 4 mEq/L (ref 3.5–5.1)
Sodium: 131 mEq/L — ABNORMAL LOW (ref 135–145)
Total Bilirubin: 0.6 mg/dL (ref 0.2–1.2)
Total Protein: 7.3 g/dL (ref 6.0–8.3)

## 2022-01-02 LAB — CBC WITH DIFFERENTIAL/PLATELET
Basophils Absolute: 0 10*3/uL (ref 0.0–0.1)
Basophils Relative: 1 % (ref 0.0–3.0)
Eosinophils Absolute: 0.1 10*3/uL (ref 0.0–0.7)
Eosinophils Relative: 3 % (ref 0.0–5.0)
HCT: 37.2 % (ref 36.0–46.0)
Hemoglobin: 12.6 g/dL (ref 12.0–15.0)
Lymphocytes Relative: 32.8 % (ref 12.0–46.0)
Lymphs Abs: 1.6 10*3/uL (ref 0.7–4.0)
MCHC: 33.8 g/dL (ref 30.0–36.0)
MCV: 97.1 fl (ref 78.0–100.0)
Monocytes Absolute: 0.3 10*3/uL (ref 0.1–1.0)
Monocytes Relative: 6.1 % (ref 3.0–12.0)
Neutro Abs: 2.8 10*3/uL (ref 1.4–7.7)
Neutrophils Relative %: 57.1 % (ref 43.0–77.0)
Platelets: 181 10*3/uL (ref 150.0–400.0)
RBC: 3.83 Mil/uL — ABNORMAL LOW (ref 3.87–5.11)
RDW: 13.9 % (ref 11.5–15.5)
WBC: 4.9 10*3/uL (ref 4.0–10.5)

## 2022-01-02 LAB — TSH: TSH: 4.86 u[IU]/mL (ref 0.35–5.50)

## 2022-01-03 DIAGNOSIS — Z79899 Other long term (current) drug therapy: Secondary | ICD-10-CM | POA: Diagnosis not present

## 2022-01-03 DIAGNOSIS — L603 Nail dystrophy: Secondary | ICD-10-CM | POA: Diagnosis not present

## 2022-01-03 DIAGNOSIS — M792 Neuralgia and neuritis, unspecified: Secondary | ICD-10-CM | POA: Diagnosis not present

## 2022-01-03 LAB — IRON,TIBC AND FERRITIN PANEL
%SAT: 24 % (calc) (ref 16–45)
Ferritin: 79 ng/mL (ref 16–288)
Iron: 91 ug/dL (ref 45–160)
TIBC: 377 mcg/dL (calc) (ref 250–450)
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
## 2022-01-04 ENCOUNTER — Emergency Department (HOSPITAL_COMMUNITY): Payer: Medicare Other

## 2022-01-04 ENCOUNTER — Emergency Department (HOSPITAL_COMMUNITY)
Admission: EM | Admit: 2022-01-04 | Discharge: 2022-01-04 | Disposition: A | Discharge status: Home / Self Care | Payer: Medicare Other | Attending: Emergency Medicine | Admitting: Emergency Medicine

## 2022-01-04 ENCOUNTER — Ambulatory Visit: Payer: Medicare Other | Admitting: Psychology

## 2022-01-04 ENCOUNTER — Telehealth: Payer: Self-pay | Admitting: Internal Medicine

## 2022-01-04 ENCOUNTER — Ambulatory Visit: Payer: Medicare Other | Admitting: Physician Assistant

## 2022-01-04 ENCOUNTER — Ambulatory Visit: Payer: Medicare Other | Admitting: Rehabilitative and Restorative Service Providers"

## 2022-01-04 ENCOUNTER — Other Ambulatory Visit: Payer: Self-pay

## 2022-01-04 ENCOUNTER — Encounter (HOSPITAL_COMMUNITY): Payer: Self-pay | Admitting: Pharmacy Technician

## 2022-01-04 ENCOUNTER — Encounter: Payer: Self-pay | Admitting: Internal Medicine

## 2022-01-04 DIAGNOSIS — I10 Essential (primary) hypertension: Secondary | ICD-10-CM | POA: Insufficient documentation

## 2022-01-04 DIAGNOSIS — B029 Zoster without complications: Secondary | ICD-10-CM | POA: Insufficient documentation

## 2022-01-04 DIAGNOSIS — Z79899 Other long term (current) drug therapy: Secondary | ICD-10-CM | POA: Insufficient documentation

## 2022-01-04 DIAGNOSIS — M25511 Pain in right shoulder: Secondary | ICD-10-CM | POA: Diagnosis not present

## 2022-01-04 DIAGNOSIS — R21 Rash and other nonspecific skin eruption: Secondary | ICD-10-CM | POA: Diagnosis present

## 2022-01-04 MED ORDER — ACYCLOVIR 800 MG PO TABS: 800.0000 mg | ORAL_TABLET | Freq: Every day | ORAL | 0 refills | Status: AC

## 2022-01-04 NOTE — ED Provider Notes (Signed)
?[facility] ?Provider Note ? ? ?CSN: [id number] ?Arrival date & time: 01/04/22  1230 ? ?  ? ?History ? ?Chief Complaint  ?Patient presents with  ? Rash  ? Arm Pain  ? ? ?Amanda Wilkins is a 76 y.o. female. ? ?76 y.o female with a PMH of HTN presents to the ED with a chief complaint of rash to her right upper back radiating down her right arm.  Patient reports rash appeared approximately 2 days ago, began feeling a burning, tingling sensation to the right elbow.  She has not taken any medication for improvement in her symptoms.  She is also endorsing pain along the right shoulder, exacerbated with any ranging feeling like a sharp sensation shooting down to her elbow.  No prior history of shingles, no prior vaccinations noted. ?Of note, patient does report being on under a lot of stress. ? ?The history is provided by the patient and medical records.  ?Rash ?Quality: blistering, burning and painful   ?Pain details:  ?  Quality:  Hot ?  Severity:  Moderate ?  Onset quality:  Gradual ?  Duration:  2 days ?  Timing:  Constant ?  Progression:  Worsening ?Severity:  Moderate ?Duration:  2 days ?Associated symptoms: joint pain   ?Associated symptoms: no abdominal pain, no fever, no nausea and not vomiting   ?Arm Pain ?Pertinent negatives include no chest pain and no abdominal pain.  ? ?  ? ?Home Medications ?Prior to Admission medications   ?Medication Sig Start Date End Date Taking? Authorizing Provider  ?acyclovir (ZOVIRAX) 800 MG tablet Take 1 tablet (800 mg total) by mouth 5 (five) times daily for 7 days. 01/04/22 01/11/22 Yes Bay Jarquin, Beverley Fiedler, PA-C  ?acetaminophen (TYLENOL) 500 MG tablet Take 1,000 mg by mouth daily as needed (pain).    [provider]  ?amLODipine (NORVASC) 2.5 MG tablet Take 0.5 tablets (1.25 mg total) by mouth daily. 11/15/21   Fay Records, MD  ?Biotin 1000 MCG tablet Take 1,000 mcg by mouth in the morning.    [provider]  ?cholecalciferol  (VITAMIN D3) 25 MCG (1000 UT) tablet Take 1,000 Units by mouth daily.    [provider]  ?clobetasol (TEMOVATE) 0.05 % external solution  11/29/21   [provider]  ?desonide (DESOWEN) 0.05 % ointment Apply topically. 11/29/21   [provider]  ?DILT-XR 180 MG 24 hr capsule  11/29/21   [provider]  ?EPINEPHrine 0.3 mg/0.3 mL IJ SOAJ injection Inject 0.3 mg into the muscle as needed for anaphylaxis. As needed for life-threatening allergic reactions ?Patient taking differently: Inject 0.3 mg into the muscle once as needed for anaphylaxis. 08/03/20   Kennith Gain, MD  ?estradiol (VIVELLE-DOT) 0.025 MG/24HR SMARTSIG:1 T-DERMAL 11/29/21   [provider]  ?finasteride (PROSCAR) 5 MG tablet  11/29/21   [provider]  ?flecainide (TAMBOCOR) 50 MG tablet Take 1.5 tablets (75 mg total) by mouth 2 (two) times daily. 08/28/21   Fay Records, MD  ?fluocinonide cream (LIDEX) 2.42 % Apply 1 application. topically 2 (two) times daily. 12/15/21   [provider]  ?glycopyrrolate (ROBINUL) 1 MG tablet  11/29/21   [provider]  ?hydrocortisone 2.5 % ointment SMARTSIG:2 Topical Twice Daily 11/29/21   [provider]  ?hydroxychloroquine (PLAQUENIL) 200 MG tablet Take 400 mg by mouth every morning.    [provider]  ?metoprolol tartrate (LOPRESSOR) 25 MG tablet Take 1 tablet (25 mg total) by  mouth 2 (two) times daily. 11/09/21   Fay Records, MD  ?Multiple Vitamin (MULTIVITAMIN WITH MINERALS) TABS tablet Take 1 tablet by mouth daily. Centrum for Women 50+    [provider]  ?Probiotic Product (ALIGN) 4 MG CAPS Take 4 mg by mouth daily.    [provider]  ?progesterone (PROMETRIUM) 200 MG capsule SMARTSIG:1 By Mouth 11/29/21   [provider]  ?rosuvastatin (CRESTOR) 5 MG tablet Take 1 tablet (5 mg total) by mouth daily. 09/07/21   Elodia Florence., MD  ?sertraline (ZOLOFT) 50 MG tablet TAKE 1  TABLET BY MOUTH DAILY 07/03/21   Plotnikov, Evie Lacks, MD  ?triamcinolone (KENALOG) 0.025 % ointment Apply topically. 11/29/21   [provider]  ?   ? ?Allergies    ?Epinephrine base, Aspirin, Benadryl [diphenhydramine], Covid-19 ad26 vaccine(janssen), Fish allergy, Hylan g-f 20, Penicillins, Tape, and Wound dressing adhesive   ? ?Review of Systems   ?Review of Systems  ?Constitutional:  Negative for chills and fever.  ?Cardiovascular:  Negative for chest pain.  ?Gastrointestinal:  Negative for abdominal pain, nausea and vomiting.  ?Musculoskeletal:  Positive for arthralgias. Negative for back pain.  ?Skin:  Positive for rash.  ? ?Physical Exam ?Updated Vital Signs ?BP (!) 187/100 (BP Location: Left Arm)   Pulse 85   Temp 98.3 ?F (36.8 ?C) (Oral)   Resp 18   SpO2 99%  ?Physical Exam ?Vitals and nursing note reviewed.  ?Constitutional:   ?   Appearance: Normal appearance.  ?HENT:  ?   Head: Normocephalic and atraumatic.  ?   Mouth/Throat:  ?   Mouth: Mucous membranes are moist.  ?Eyes:  ?   Pupils: Pupils are equal, round, and reactive to light.  ?Cardiovascular:  ?   Rate and Rhythm: Normal rate.  ?Pulmonary:  ?   Effort: Pulmonary effort is normal.  ?Abdominal:  ?   General: Abdomen is flat.  ?   Tenderness: There is no abdominal tenderness.  ?Musculoskeletal:  ?   Cervical back: Normal range of motion and neck supple.  ?Skin: ?   General: Skin is warm and dry.  ?   Findings: Erythema and rash present.  ? ?    ?Neurological:  ?   Mental Status: She is alert and oriented to person, place, and time.  ? ? ?ED Results / Procedures / Treatments   ?Labs ?(all labs ordered are listed, but only abnormal results are displayed) ?Labs Reviewed - No data to display ? ?EKG ?None ? ?Radiology ?DG Shoulder Right ? ?Result Date: 01/04/2022 ?CLINICAL DATA:  Acute right shoulder pain without known injury. EXAM: RIGHT SHOULDER - 2+ VIEW COMPARISON:  December 23, 2017. FINDINGS: There is no evidence of fracture or dislocation.  There is no evidence of arthropathy or other focal bone abnormality. Soft tissues are unremarkable. IMPRESSION: Negative. Electronically Signed   By: Marijo Conception M.D.   On: 01/04/2022 15:00   ? ?Procedures ?Procedures  ? ?Medications Ordered in ED ?Medications - No data to display ? ?ED Course/ Medical Decision Making/ A&P ?  ?                        ?Medical Decision Making ?Amount and/or Complexity of Data Reviewed ?Radiology: ordered. ? ?Risk ?Prescription drug management. ? ?Patient presents to the ED with a chief complaint of rash to her thoracic spine, extending down to her right arm for the past 2 days.  Describes this as  a burning sensation prior to arrival of the rash.  Is concerned as there is so much pain especially with movement of her right shoulder, especially with ranging.  sHe denies any prior history of shingles, although this was noted on her records. ? ?Vitals remarkable for hypertension, patient does report being under a lot of stress.  She has full range of motion of her right shoulder, there are no signs of dislocation on my exam, her pulses are present and her strength to bilateral upper extremities is equal and intact.  Is requesting x-ray on today's visit to rule out acute abnormality. ? ?X-ray of her right shoulder did not show any acute findings on today's visit.  She was provided with a copy of her x-ray. ? ?We discussed treatment with acyclovir 100 mg every 5 for the next 7 days.  She is aware to follow-up with PCP.  She was also provided with steroids yesterday at urgent care, I discussed with her discontinuing this medication at this time.  Vitals remained stable, patient is stable for discharge. ? ?  ? ?Portions of this note were generated with Lobbyist. Dictation errors may occur despite best attempts at proofreading.   ?Final Clinical Impression(s) / ED Diagnoses ?Final diagnoses:  ?Herpes zoster without complication  ? ? ?Rx / DC Orders ?ED Discharge Orders    ? ?      Ordered  ?  acyclovir (ZOVIRAX) 800 MG tablet  5 times daily       ? 01/04/22 1414  ? ?  ?  ? ?  ? ? ?  ?Janeece Fitting, PA-C ?01/04/22 1531 ? ?  ?Lajean Saver, MD ?01/06/22 6979 ? ?

## 2022-01-04 NOTE — Discharge Instructions (Addendum)
You were prescribed medication on today's visit in order to help treat your shingles. ? ?You will need to take 1 tablet five times a day for the next 7 days. ? ?Follow-up with your primary care physician as needed. ? ? ?

## 2022-01-04 NOTE — Telephone Encounter (Signed)
Pt checking status of 01-02-2022 lab results ? ?Informed pt of provider's 01-03-2022 result notes ? ?Pt verbalized understanding ?

## 2022-01-04 NOTE — ED Provider Triage Note (Signed)
Emergency Medicine Provider Triage Evaluation Note ? ?Amanda Wilkins , a 76 y.o. female  was evaluated in triage.  Pt complains of gradual onset, constant, burning/shooting type pain from her neck down her right arm.  She states that she was seen at urgent care yesterday who thought that she had a pinched nerve.  She was prescribed methylprednisolone and states that she has been taking it x1 day without relief.  She states that she now noticed a rash to her right arm that occurred yesterday after leaving urgent care.  She states that her husband noticed rash to her upper back/neck area this morning.  She decided to come to the ED for further eval.  Patient states that she did have chickenpox as a child.  ? ?Review of Systems  ?Positive: + neck/arm pain, rash ?Negative: - fevers, chills ? ?Physical Exam  ?BP (!) 187/100 (BP Location: Left Arm)   Pulse 85   Temp 98.3 ?F (36.8 ?C) (Oral)   Resp 18   SpO2 99%  ?Gen:   Awake, no distress   ?Resp:  Normal effort  ?MSK:   Moves extremities without difficulty  ?Other:  Maculopapular rash with small vesicles noted to the upper neck/back area, does not cross midline.  Additional rash to right upper arm, humeral area.  Following C6 dermatome. ? ?Medical Decision Making  ?Medically screening exam initiated at 12:59 PM.  Appropriate orders placed.  Amanda Wilkins was informed that the remainder of the evaluation will be completed by another provider, this initial triage assessment does not replace that evaluation, and the importance of remaining in the ED until their evaluation is complete. ? ? ?  ?Eustaquio Maize, PA-C ?01/04/22 1302 ? ?

## 2022-01-04 NOTE — ED Triage Notes (Signed)
Pt here with rash to R arm and R back onset Tuesday. Pt denies pain to rash currently but endorses a burning sensation prior to rash onset. Pt also complains of possible pinched nerve in her back, seen at [facility] for the same and prescribed prednisone with no relief.  ?

## 2022-01-04 NOTE — Progress Notes (Incomplete)
?Cardiology Office Note:   ? ?Date:  01/04/2022  ? ?ID:  Amanda Wilkins, DOB [birth year], MRN [id number] ? ?PCP:  Plotnikov, Evie Lacks, MD ?  ?[facility] Providers ?Cardiologist:  [name], MD { ?Click to update primary MD,subspecialty MD or APP then REFRESH:1}   ? ?Referring MD: [name], MD  ? ?Pre op clearance ? ?History of Present Illness:   ? ?[name] is a 76 y.o. female with a hx of chest pain, near syncope, PAF, and morbid obesity who presents to clinic for follow up and pre op clearance.  ? ?Echo in 2016 showed normal LVEF. Carotid USN showed mild plaquing. Holter showed intermitt afib/SVT (first Dx 2010). Myovue showed inferolateral and inferior defect consistent with ischemia. CT calcium score 2020 was 0. PFTs done for dyspnea were normal. Echo in 2021 normal LVEF /RVEF ?CPX done in Aug 2021 showed limitation felt due to body weight. Pt enrolled into [employer] and felt much better with no signif dyspnea ?  ?In 08/2021 she she was admitted with afib wtth RVR  She was started on Flecanide. She was set up for a cardioversion on 08/28/21 and converted to SR but reverted back to afib before d/c from the endo unit. Flecanide was increased to 75 bid. She was admitted on 11/18 with abdominal pain   Found to have cholecystitis. Also found to be in SR. Due to recent conversion to SR (from flecanide) she was treate with ABX only and surgery deferred fro 1 month. She eventually had laparoscopic cholecystectomy on 11/30/21. ? ?Today the patient presents to clinic for follow up and surgical clearance prior to upcoming hip surgery.  ? ?  ? ? ? ?Past Medical History:  ?Diagnosis Date  ? Acute bronchitis 09/11/2016  ? 11/17 refractory  ? Acute cystitis without hematuria 05/17/2015  ? Acute kidney injury ([place])   ? Labs today  ? Acute right ankle pain 09/09/2018  ? Anticoagulant long-term use   ? Xarelto  ? Axillary pain   ? Bronchitis   ? Cellulitis   ? Cholecystitis   ? Chronic interstitial  nephritis   ? CONJUNCTIVITIS, ACUTE 10/18/2010  ? Qualifier: Diagnosis of  By: Plotnikov MD, Evie Wilkins   ? D-dimer, elevated   ? Depression   ? Eye inflammation   ? GERD (gastroesophageal reflux disease)   ? History of interstitial nephritis 2016  ? chronic  ? History of kidney stones   ? has a small one found on xray  ? History of nuclear stress test 12/16/2014  ? Intermediate risk nuclear study w/ medium size moderate severity reversible defect in the basal and mid inferolateral and inferior wall (per dr cardiology note , dr [name] did not think this was consistent with ischemia)/  normal LV function and wall motion, ef 75%  ? History of septic shock 01/22/2015  ? in setting Group A Strep Cellulits erysipelas/chest wall induration with streptoccocus basterium-- Severe sepsis, DIC, Acute respiratory failure with pulmonary edema, Acute Kidney failure with chronic interstitial nephritis  ? Hypertension   ? Hyponatremia   ? Migraines   ? on Zoloft for migraines  ? Mild carotid artery disease ([place])   ? per duplex 08-30-2017 bilateral ICA 1-39%  ? OA (osteoarthritis) rheumotologist-  dr [name]  ? both knees,  shoulders, ankles  ? OSA on CPAP   ?  moderate obstructive sleep apnea with an AHI of 23.4/h and oxygen desaturations as low as 84%.  Now on CPAP at 12  cm H2O.  ? PAF (paroxysmal atrial fibrillation) ([place]) 2009  ? cardiologist-- dr [name]  ? Paroxysmal atrial flutter ([place])   ? a. dx 11/2017.  ? PSVT (paroxysmal supraventricular tachycardia) ([place])   ? Wears contact lenses   ? ? ?Past Surgical History:  ?Procedure Laterality Date  ? BREAST BIOPSY Right 05/15/2017  ? [place]  ? CARDIOVERSION N/A 08/28/2021  ? Procedure: CARDIOVERSION;  Surgeon: Fay Records, MD;  Location: [facility];  Service: Cardiovascular;  Laterality: N/A;  ? [place]  ? CHOLECYSTECTOMY N/A 11/16/2021  ? Procedure: LAPAROSCOPIC CHOLECYSTECTOMY;  Surgeon: Ralene Ok, MD;  Location: [facility];  Service: General;   Laterality: N/A;  ? ESOPHAGOGASTRODUODENOSCOPY N/A 02/08/2015  ? Procedure: ESOPHAGOGASTRODUODENOSCOPY (EGD);  Surgeon: [name], MD;  Location: [facility];  Service: Endoscopy;  Laterality: N/A;  ? KNEE ARTHROSCOPY W/ MENISCAL REPAIR Left 08/2014    '@[facility]'$   ? SHOULDER SURGERY Right 04/04/2016  ? TOTAL KNEE ARTHROPLASTY Right 09/01/2018  ? Procedure: RIGHT TOTAL KNEE ARTHROPLASTY;  Surgeon: [name], MD;  Location: [facility];  Service: Orthopedics;  Laterality: Right;  ? ? ?Current Medications: ?No outpatient medications have been marked as taking for the 01/04/22 encounter (Appointment) with Eileen Stanford, PA-C.  ?  ? ?Allergies:   Epinephrine base, Aspirin, Benadryl [diphenhydramine], Covid-19 ad26 vaccine(janssen), Fish allergy, Hylan g-f 20, Penicillins, Tape, and Wound dressing adhesive  ? ?Social History  ? ?Socioeconomic History  ? Marital status: Married  ?  Spouse name: Not on file  ? Number of children: 2  ? Years of education: Not on file  ? Highest education level: Not on file  ?Occupational History  ? Occupation: retired  ?  Employer: UNEMPLOYED  ? Occupation: Part-time (with temp agency)  ?Tobacco Use  ? Smoking status: Never  ? Smokeless tobacco: Never  ? Tobacco comments:  ?  H/O social smoking at times when drinking--never a "habit" (in the 1970s)  ?Vaping Use  ? Vaping Use: Never used  ?Substance and Sexual Activity  ? Alcohol use: Yes  ?  Alcohol/week: 0.0 standard drinks  ?  Comment: Occasional  ? Drug use: Never  ? Sexual activity: Yes  ?  Partners: Male  ?  Birth control/protection: Post-menopausal  ?  Comment: intercourse age 83, sexual partners more than 5  ?Other Topics Concern  ? Not on file  ?Social History Narrative  ? Not on file  ? ?Social Determinants of Health  ? ?Financial Resource Strain: Low Risk   ? Difficulty of Paying Living Expenses: Not hard at all  ?Food Insecurity: No Food Insecurity  ? Worried About Charity fundraiser in the Last Year: Never true  ? Ran Out  of Food in the Last Year: Never true  ?Transportation Needs: No Transportation Needs  ? Lack of Transportation (Medical): No  ? Lack of Transportation (Non-Medical): No  ?Physical Activity: Sufficiently Active  ? Days of Exercise per Week: 5 days  ? Minutes of Exercise per Session: 30 min  ?Stress: No Stress Concern Present  ? Feeling of Stress : Not at all  ?Social Connections: Moderately Isolated  ? Frequency of Communication with Friends and Family: More than three times a week  ? Frequency of Social Gatherings with Friends and Family: More than three times a week  ? Attends Religious Services: Never  ? Active Member of Clubs or Organizations: No  ? Attends Archivist Meetings: Never  ? Marital Status: Married  ?  ? ?  Family History: ?The patient's family history includes Allergic rhinitis in her sister; Breast cancer (age of onset: 87) in her sister; Lymphoma in her mother; Pancreatic cancer in her brother; Prostate cancer in her father; Urticaria in her sister. There is no history of Asthma, Eczema, Immunodeficiency, Atopy, or Angioedema. ? ?ROS:   ?Please see the history of present illness.    ?All other systems reviewed and are negative. ? ?EKGs/Labs/Other Studies Reviewed:   ? ?The following studies were reviewed today: ? ?Outlined in HPI ? ?EKG:  EKG is ordered today.  The ekg ordered today demonstrates *** ? ?Recent Labs: ?08/19/2021: Magnesium 2.0 ?01/02/2022: ALT 20; BUN 13; Creatinine, Ser 0.70; Hemoglobin 12.6; Platelets 181.0; Potassium 4.0; Sodium 131; TSH 4.86  ?Recent Lipid Panel ?   ?Component Value Date/Time  ? CHOL 175 11/04/2020 1224  ? TRIG 69 11/04/2020 1224  ? HDL 102 11/04/2020 1224  ? CHOLHDL 1.7 11/04/2020 1224  ? CHOLHDL 2 05/28/2018 0837  ? VLDL 13.8 05/28/2018 0837  ? [place] 60 11/04/2020 1224  ? ? ? ?Risk Assessment/Calculations:   ? ?CHA2DS2-VASc Score = 4  ?{Confirm score is correct.  If not, click here to update score.  REFRESH note.  :1} This indicates a 4.8% annual risk  of stroke. ?The patient's score is based upon: ?CHF History: 1 ?HTN History: 1 ?Diabetes History: 0 ?Stroke History: 0 ?Vascular Disease History: 0 ?Age Score: 1 ?Gender Score: 1 ?  ?{This patient has a significant

## 2022-01-10 ENCOUNTER — Ambulatory Visit: Payer: Medicare Other | Admitting: Psychology

## 2022-01-11 ENCOUNTER — Ambulatory Visit: Payer: Medicare Other | Admitting: Psychology

## 2022-01-13 ENCOUNTER — Other Ambulatory Visit: Payer: Self-pay | Admitting: Internal Medicine

## 2022-01-15 ENCOUNTER — Encounter (HOSPITAL_COMMUNITY): Payer: Self-pay | Admitting: Emergency Medicine

## 2022-01-15 ENCOUNTER — Emergency Department (HOSPITAL_COMMUNITY)
Admission: EM | Admit: 2022-01-15 | Discharge: 2022-01-15 | Disposition: A | Discharge status: Home / Self Care | Payer: Medicare Other | Attending: Emergency Medicine | Admitting: Emergency Medicine

## 2022-01-15 ENCOUNTER — Other Ambulatory Visit: Payer: Self-pay

## 2022-01-15 ENCOUNTER — Emergency Department (HOSPITAL_COMMUNITY): Payer: Medicare Other

## 2022-01-15 DIAGNOSIS — N200 Calculus of kidney: Secondary | ICD-10-CM | POA: Diagnosis not present

## 2022-01-15 DIAGNOSIS — Z79899 Other long term (current) drug therapy: Secondary | ICD-10-CM | POA: Insufficient documentation

## 2022-01-15 DIAGNOSIS — Z7901 Long term (current) use of anticoagulants: Secondary | ICD-10-CM | POA: Insufficient documentation

## 2022-01-15 DIAGNOSIS — K529 Noninfective gastroenteritis and colitis, unspecified: Secondary | ICD-10-CM | POA: Diagnosis not present

## 2022-01-15 DIAGNOSIS — M1611 Unilateral primary osteoarthritis, right hip: Secondary | ICD-10-CM | POA: Diagnosis not present

## 2022-01-15 DIAGNOSIS — R112 Nausea with vomiting, unspecified: Secondary | ICD-10-CM | POA: Diagnosis present

## 2022-01-15 DIAGNOSIS — I10 Essential (primary) hypertension: Secondary | ICD-10-CM | POA: Diagnosis not present

## 2022-01-15 DIAGNOSIS — K769 Liver disease, unspecified: Secondary | ICD-10-CM | POA: Diagnosis not present

## 2022-01-15 DIAGNOSIS — K6389 Other specified diseases of intestine: Secondary | ICD-10-CM | POA: Diagnosis not present

## 2022-01-15 LAB — CBC
HCT: 39.9 % (ref 36.0–46.0)
Hemoglobin: 13.5 g/dL (ref 12.0–15.0)
MCH: 32.4 pg (ref 26.0–34.0)
MCHC: 33.8 g/dL (ref 30.0–36.0)
MCV: 95.7 fL (ref 80.0–100.0)
Platelets: 214 10*3/uL (ref 150–400)
RBC: 4.17 MIL/uL (ref 3.87–5.11)
RDW: 12.9 % (ref 11.5–15.5)
WBC: 7.6 10*3/uL (ref 4.0–10.5)
nRBC: 0 % (ref 0.0–0.2)

## 2022-01-15 LAB — TYPE AND SCREEN
ABO/RH(D): A POS
Antibody Screen: NEGATIVE

## 2022-01-15 LAB — COMPREHENSIVE METABOLIC PANEL
ALT: 28 U/L (ref 0–44)
AST: 32 U/L (ref 15–41)
Albumin: 4.1 g/dL (ref 3.5–5.0)
Alkaline Phosphatase: 69 U/L (ref 38–126)
Anion gap: 11 (ref 5–15)
BUN: 11 mg/dL (ref 8–23)
CO2: 21 mmol/L — ABNORMAL LOW (ref 22–32)
Calcium: 10 mg/dL (ref 8.9–10.3)
Chloride: 100 mmol/L (ref 98–111)
Creatinine, Ser: 0.98 mg/dL (ref 0.44–1.00)
GFR, Estimated: >60 mL/min (ref >60)
Glucose, Bld: 117 mg/dL — ABNORMAL HIGH (ref 70–99)
Potassium: 4.7 mmol/L (ref 3.5–5.1)
Sodium: 132 mmol/L — ABNORMAL LOW (ref 135–145)
Total Bilirubin: 0.7 mg/dL (ref 0.3–1.2)
Total Protein: 7 g/dL (ref 6.5–8.1)

## 2022-01-15 MED ORDER — CIPROFLOXACIN HCL 500 MG PO TABS: 500.0000 mg | ORAL_TABLET | Freq: Two times a day (BID) | ORAL | 0 refills | Status: AC

## 2022-01-15 MED ORDER — IOHEXOL 300 MG/ML  SOLN
100.0000 mL | Freq: Once | INTRAMUSCULAR | Status: AC | PRN
  Administered 2022-01-15: 100 mL via INTRAVENOUS

## 2022-01-15 MED ORDER — METRONIDAZOLE 500 MG PO TABS: 500.0000 mg | ORAL_TABLET | Freq: Three times a day (TID) | ORAL | 0 refills | Status: AC

## 2022-01-15 NOTE — ED Provider Notes (Signed)
?[facility] ?Provider Note ? ? ?CSN: [id number] ?Arrival date & time: 01/15/22  0719 ? ?  ? ?History ? ?Chief Complaint  ?Patient presents with  ? Rectal Bleeding  ? ? ?Amanda Wilkins is a 76 y.o. female. ? ?HPI ?76 year old female with a history of paroxysmal A-fib on Eliquis, migraines, AKI, renal stones, hypertension, SVT, GERD presents to the ER with complaints of rectal bleeding.  Patient states that yesterday it was on her husband's anniversary, and she had a cupcake and some first echo.  Afterwards she felt very nauseous and had an episode of vomiting and diarrhea.  She states the next time she went to the bathroom several hours later she noted bright red blood on her toilet paper which was about as heavy as a period.  She does state that she believes this is rectal and not vaginal.  She has had several episodes like this since, only bleeding when she sits on the toilet.  She denies any abdominal pain, nausea, vomiting, fever.  No dizziness.  She has an appointment with her primary care doctor tomorrow, however given that she is on Eliquis she came to the ER to be evaluated since she continues to have bleeding.  Denies any history of hemorrhoids or prior GI bleed.  Denies any significant alcohol use.  She reports recent course of shingles and has been taking acyclovir and Tylenol for this. ?  ? ?Home Medications ?Prior to Admission medications   ?Medication Sig Start Date End Date Taking? Authorizing Provider  ?ciprofloxacin (CIPRO) 500 MG tablet Take 1 tablet (500 mg total) by mouth 2 (two) times daily for 7 days. 01/15/22 01/22/22 Yes Garald Balding, PA-C  ?metroNIDAZOLE (FLAGYL) 500 MG tablet Take 1 tablet (500 mg total) by mouth 3 (three) times daily for 7 days. 01/15/22 01/22/22 Yes Garald Balding, PA-C  ?acetaminophen (TYLENOL) 500 MG tablet Take 1,000 mg by mouth daily as needed (pain).    [provider]  ?amLODipine (NORVASC) 2.5 MG tablet Take 0.5  tablets (1.25 mg total) by mouth daily. 11/15/21   Fay Records, MD  ?Biotin 1000 MCG tablet Take 1,000 mcg by mouth in the morning.    [provider]  ?cholecalciferol (VITAMIN D3) 25 MCG (1000 UT) tablet Take 1,000 Units by mouth daily.    [provider]  ?clobetasol (TEMOVATE) 0.05 % external solution  11/29/21   [provider]  ?desonide (DESOWEN) 0.05 % ointment Apply topically. 11/29/21   [provider]  ?DILT-XR 180 MG 24 hr capsule  11/29/21   [provider]  ?ELIQUIS 5 MG TABS tablet Take 5 mg by mouth 2 (two) times daily. 01/12/22   [provider]  ?EPINEPHrine 0.3 mg/0.3 mL IJ SOAJ injection Inject 0.3 mg into the muscle as needed for anaphylaxis. As needed for life-threatening allergic reactions ?Patient taking differently: Inject 0.3 mg into the muscle once as needed for anaphylaxis. 08/03/20   Kennith Gain, MD  ?estradiol (VIVELLE-DOT) 0.025 MG/24HR Place 1 patch onto the skin 2 (two) times a week. 11/29/21   [provider]  ?finasteride (PROSCAR) 5 MG tablet  11/29/21   [provider]  ?flecainide (TAMBOCOR) 50 MG tablet Take 1.5 tablets (75 mg total) by mouth 2 (two) times daily. 08/28/21   Fay Records, MD  ?fluocinonide cream (LIDEX) 9.83 % Apply 1 application. topically 2 (two) times daily. 12/15/21   [provider]  ?glycopyrrolate (ROBINUL) 1 MG tablet  11/29/21  [provider]  ?hydrocortisone 2.5 % ointment Apply 1 application. topically 2 (two) times daily as needed. 11/29/21   [provider]  ?hydroxychloroquine (PLAQUENIL) 200 MG tablet Take 400 mg by mouth every morning.    [provider]  ?metoprolol tartrate (LOPRESSOR) 25 MG tablet Take 1 tablet (25 mg total) by mouth 2 (two) times daily. 11/09/21   Fay Records, MD  ?Multiple Vitamin (MULTIVITAMIN WITH MINERALS) TABS tablet Take 1 tablet by mouth daily. Centrum for Women 50+    [provider]  ?Probiotic  Product (ALIGN) 4 MG CAPS Take 4 mg by mouth daily.    [provider]  ?progesterone (PROMETRIUM) 200 MG capsule SMARTSIG:1 By Mouth 11/29/21   [provider]  ?rosuvastatin (CRESTOR) 5 MG tablet Take 1 tablet (5 mg total) by mouth daily. 09/07/21   Elodia Florence., MD  ?sertraline (ZOLOFT) 50 MG tablet TAKE 1 TABLET BY MOUTH DAILY ?Patient taking differently: Take 50 mg by mouth daily. 07/03/21   Plotnikov, Evie Lacks, MD  ?triamcinolone (KENALOG) 0.025 % ointment Apply topically. 11/29/21   [provider]  ?   ? ?Allergies    ?Epinephrine base, Aspirin, Benadryl [diphenhydramine], Covid-19 ad26 vaccine(janssen), Fish allergy, Hylan g-f 20, Penicillins, Tape, and Wound dressing adhesive   ? ?Review of Systems   ?Review of Systems ?Ten systems reviewed and are negative for acute change, except as noted in the HPI.  ? ?Physical Exam ?Updated Vital Signs ?BP (!) 147/66 (BP Location: Left Arm)   Pulse 81   Temp 97.9 ?F (36.6 ?C) (Oral)   Resp (!) 21   SpO2 99%  ?Physical Exam ?Vitals and nursing note reviewed.  ?Constitutional:   ?   General: She is not in acute distress. ?   Appearance: She is well-developed.  ?HENT:  ?   Head: Normocephalic and atraumatic.  ?Eyes:  ?   Conjunctiva/sclera: Conjunctivae normal.  ?Cardiovascular:  ?   Rate and Rhythm: Normal rate and regular rhythm.  ?   Heart sounds: No murmur heard. ?Pulmonary:  ?   Effort: Pulmonary effort is normal. No respiratory distress.  ?   Breath sounds: Normal breath sounds.  ?Abdominal:  ?   Palpations: Abdomen is soft.  ?   Tenderness: There is no abdominal tenderness.  ?Genitourinary: ?   Comments: GU exam performed w/ RN at bedside.  No visible external hemorrhoids.  Scant red blood seen with rectal exam ?Musculoskeletal:     ?   General: No swelling.  ?   Cervical back: Neck supple.  ?Skin: ?   General: Skin is warm and dry.  ?   Capillary Refill: Capillary refill takes less than 2 seconds.  ?Neurological:  ?   Mental  Status: She is alert.  ?Psychiatric:     ?   Mood and Affect: Mood normal.  ? ? ?ED Results / Procedures / Treatments   ?Labs ?(all labs ordered are listed, but only abnormal results are displayed) ?Labs Reviewed  ?COMPREHENSIVE METABOLIC PANEL - Abnormal; Notable for the following components:  ?    Result Value  ? Sodium 132 (*)   ? CO2 21 (*)   ? Glucose, Bld 117 (*)   ? All other components within normal limits  ?CBC  ?POC OCCULT BLOOD, ED  ?TYPE AND SCREEN  ?ABO/RH  ? ? ?EKG ?None ? ?Radiology ?CT ABDOMEN PELVIS W CONTRAST ? ?Result Date: 01/15/2022 ?CLINICAL DATA:  Lower rectal bleeding EXAM: CT ABDOMEN AND PELVIS  WITH CONTRAST TECHNIQUE: Multidetector CT imaging of the abdomen and pelvis was performed using the standard protocol following bolus administration of intravenous contrast. RADIATION DOSE REDUCTION: This exam was performed according to the departmental dose-optimization program which includes automated exposure control, adjustment of the mA and/or kV according to patient size and/or use of iterative reconstruction technique. CONTRAST:  16m OMNIPAQUE IOHEXOL 300 MG/ML  SOLN COMPARISON:  CT abdomen/pelvis 09/01/2021 FINDINGS: Lower chest: Lung bases are clear. Mitral annular calcifications are noted. The imaged heart is otherwise unremarkable. Hepatobiliary: A subcentimeter hypodense lesion in the left hepatic lobe is too small to characterize, but likely reflects a small cyst. No specific imaging follow-up is required. The liver is otherwise unremarkable. The gallbladder is surgically absent. There is mild prominence of the extrahepatic bile ducts likely due to the cholecystectomy. There is no intrahepatic biliary ductal dilatation. Pancreas: Unremarkable Spleen: Unremarkable. Adrenals/Urinary Tract: The adrenals are unremarkable. There is a 3 mm nonobstructing right upper pole renal stone. There are no other stones. There are no focal parenchymal lesions. There is symmetric excretion of contrast into  the collecting systems on the delayed images. The bladder is unremarkable. Stomach/Bowel: The stomach is unremarkable. There is no evidence of bowel obstruction. There is mild thickening of the colonic wall

## 2022-01-15 NOTE — ED Notes (Signed)
Pt declining IV at this time because of difficulty in the past, PA aware ?

## 2022-01-15 NOTE — Discharge Instructions (Signed)
You were evaluated in the Emergency Department and after careful evaluation, we did not find any emergent condition requiring admission or further testing in the hospital. ? ?Your workup today showed a possible infectious/inflammatory colitis. We will treat this with antibiotics, take this as directed until finished. Please make sure to follow up with your primary care doctor for this and your shingles, and to keep a close eye on your blood counts.  ? ?Please return to the Emergency Department if you experience any worsening of your condition. Thank you for allowing [place] to be a part of your care. ? ?

## 2022-01-15 NOTE — ED Triage Notes (Signed)
Pt reported to ED eith c/o rectal bleeding since yesterday, state she thinks they are hemorrhoids but expresses concerns d/t taking blood thinners. Denies any abdominal pain at this time but states she had a bout  of nausea and diarrhea yesterday at time of onset of bleeding.  ?

## 2022-01-15 NOTE — ED Notes (Signed)
PA said she would except a trained nurse with sono experience try.  ?

## 2022-01-16 ENCOUNTER — Encounter: Payer: Self-pay | Admitting: Internal Medicine

## 2022-01-16 ENCOUNTER — Ambulatory Visit (INDEPENDENT_AMBULATORY_CARE_PROVIDER_SITE_OTHER): Payer: Medicare Other | Admitting: Internal Medicine

## 2022-01-16 DIAGNOSIS — I4819 Other persistent atrial fibrillation: Secondary | ICD-10-CM | POA: Diagnosis not present

## 2022-01-16 DIAGNOSIS — R002 Palpitations: Secondary | ICD-10-CM

## 2022-01-16 DIAGNOSIS — B0229 Other postherpetic nervous system involvement: Secondary | ICD-10-CM

## 2022-01-16 DIAGNOSIS — M1991 Primary osteoarthritis, unspecified site: Secondary | ICD-10-CM | POA: Insufficient documentation

## 2022-01-16 MED ORDER — HYDROCODONE-ACETAMINOPHEN 7.5-325 MG PO TABS: 1.0000 | ORAL_TABLET | Freq: Four times a day (QID) | ORAL | 0 refills | Status: DC | PRN

## 2022-01-16 MED ORDER — GABAPENTIN 100 MG PO CAPS: 100.0000 mg | ORAL_CAPSULE | Freq: Four times a day (QID) | ORAL | 3 refills | Status: DC

## 2022-01-16 NOTE — Assessment & Plan Note (Addendum)
R arm, chest - worse ?Zoster pain and rash since 3/22; lately started to have deep pain 10/10 x 1wk- R arm ?Can take Hydrocodone ok - will start Norco and Gabapentin ? Potential benefits of a short or  long term opioids use as well as potential risks (i.e. addiction risk, apnea etc) and complications (i.e. Somnolence, constipation and others) were explained to the patient and were aknowledged. ? ?

## 2022-01-16 NOTE — Progress Notes (Signed)
? ?Subjective:  ?Patient ID: Amanda Wilkins, female    DOB: 12/10/1945  Age: 76 y.o. MRN: [id number] ? ?CC: No chief complaint on file. ? ? ?HPI ?Amanda Wilkins presents for Zoster pain and rash since 3/22; lately started to have deep pain up to 10/10 x 1 week- R arm ?Can take Hydrocodone ok. ?F/u on A fib ? ? ? ? ?Outpatient Medications Prior to Visit  ?Medication Sig Dispense Refill  ? acetaminophen (TYLENOL) 500 MG tablet Take 1,000 mg by mouth daily as needed (pain).    ? amLODipine (NORVASC) 2.5 MG tablet Take 0.5 tablets (1.25 mg total) by mouth daily. 90 tablet 3  ? Biotin 1000 MCG tablet Take 1,000 mcg by mouth in the morning.    ? cholecalciferol (VITAMIN D3) 25 MCG (1000 UT) tablet Take 1,000 Units by mouth daily.    ? ciprofloxacin (CIPRO) 500 MG tablet Take 1 tablet (500 mg total) by mouth 2 (two) times daily for 7 days. 14 tablet 0  ? clobetasol (TEMOVATE) 0.05 % external solution     ? desonide (DESOWEN) 0.05 % ointment Apply topically.    ? DILT-XR 180 MG 24 hr capsule     ? ELIQUIS 5 MG TABS tablet Take 5 mg by mouth 2 (two) times daily.    ? EPINEPHrine 0.3 mg/0.3 mL IJ SOAJ injection Inject 0.3 mg into the muscle as needed for anaphylaxis. As needed for life-threatening allergic reactions (Patient taking differently: Inject 0.3 mg into the muscle once as needed for anaphylaxis.) 2 each 1  ? estradiol (VIVELLE-DOT) 0.025 MG/24HR Place 1 patch onto the skin 2 (two) times a week.    ? finasteride (PROSCAR) 5 MG tablet     ? flecainide (TAMBOCOR) 50 MG tablet Take 1.5 tablets (75 mg total) by mouth 2 (two) times daily. 135 tablet 3  ? fluocinonide cream (LIDEX) 9.62 % Apply 1 application. topically 2 (two) times daily.    ? glycopyrrolate (ROBINUL) 1 MG tablet     ? hydrocortisone 2.5 % ointment Apply 1 application. topically 2 (two) times daily as needed.    ? hydroxychloroquine (PLAQUENIL) 200 MG tablet Take 400 mg by mouth every morning.    ? metoprolol tartrate (LOPRESSOR) 25 MG tablet  Take 1 tablet (25 mg total) by mouth 2 (two) times daily. 30 tablet 11  ? metroNIDAZOLE (FLAGYL) 500 MG tablet Take 1 tablet (500 mg total) by mouth 3 (three) times daily for 7 days. 21 tablet 0  ? Multiple Vitamin (MULTIVITAMIN WITH MINERALS) TABS tablet Take 1 tablet by mouth daily. Centrum for Women 50+    ? Probiotic Product (ALIGN) 4 MG CAPS Take 4 mg by mouth daily.    ? progesterone (PROMETRIUM) 200 MG capsule SMARTSIG:1 By Mouth    ? rosuvastatin (CRESTOR) 5 MG tablet Take 1 tablet (5 mg total) by mouth daily. 90 tablet 3  ? sertraline (ZOLOFT) 50 MG tablet TAKE 1 TABLET BY MOUTH DAILY 90 tablet 1  ? triamcinolone (KENALOG) 0.025 % ointment Apply topically.    ? ?No facility-administered medications prior to visit.  ? ? ?ROS: ?Review of Systems  ?Constitutional:  Positive for fatigue. Negative for activity change, appetite change, chills and unexpected weight change.  ?HENT:  Negative for congestion, mouth sores and sinus pressure.   ?Eyes:  Negative for visual disturbance.  ?Respiratory:  Negative for cough and chest tightness.   ?Gastrointestinal:  Negative for abdominal pain and nausea.  ?Genitourinary:  Negative for difficulty urinating, frequency and vaginal  pain.  ?Musculoskeletal:  Positive for arthralgias and neck pain. Negative for back pain and gait problem.  ?Skin:  Negative for pallor and rash.  ?Neurological:  Negative for dizziness, tremors, weakness, numbness and headaches.  ?Psychiatric/Behavioral:  Positive for sleep disturbance. Negative for confusion and suicidal ideas. The patient is nervous/anxious.   ? ?Objective:  ?BP 120/82 (BP Location: Left Arm, Patient Position: Sitting, Cuff Size: Large)   Pulse 88   Temp 98.7 ?F (37.1 ?C) (Oral)   Ht '5\' 5"'$  (1.651 m)   Wt 232 lb (105.2 kg)   SpO2 99%   BMI 38.61 kg/m?  ? ?BP Readings from Last 3 Encounters:  ?01/16/22 120/82  ?01/15/22 (!) 147/66  ?01/04/22 (!) 178/88  ? ? ?Wt Readings from Last 3 Encounters:  ?01/16/22 232 lb (105.2 kg)   ?12/25/21 237 lb 6.4 oz (107.7 kg)  ?11/22/21 233 lb 3.2 oz (105.8 kg)  ? ? ?Physical Exam ?Constitutional:   ?   General: She is not in acute distress. ?   Appearance: She is well-developed.  ?HENT:  ?   Head: Normocephalic.  ?   Right Ear: External ear normal.  ?   Left Ear: External ear normal.  ?   Nose: Nose normal.  ?Eyes:  ?   General:     ?   Right eye: No discharge.     ?   Left eye: No discharge.  ?   Conjunctiva/sclera: Conjunctivae normal.  ?   Pupils: Pupils are equal, round, and reactive to light.  ?Neck:  ?   Thyroid: No thyromegaly.  ?   Vascular: No JVD.  ?   Trachea: No tracheal deviation.  ?Cardiovascular:  ?   Rate and Rhythm: Normal rate and regular rhythm.  ?   Heart sounds: Normal heart sounds.  ?Pulmonary:  ?   Effort: No respiratory distress.  ?   Breath sounds: No stridor. No wheezing.  ?Abdominal:  ?   General: Bowel sounds are normal. There is no distension.  ?   Palpations: Abdomen is soft. There is no mass.  ?   Tenderness: There is no abdominal tenderness. There is no guarding or rebound.  ?Musculoskeletal:     ?   General: No tenderness.  ?   Cervical back: Normal range of motion and neck supple. No rigidity.  ?Lymphadenopathy:  ?   Cervical: No cervical adenopathy.  ?Skin: ?   Findings: No erythema or rash.  ?Neurological:  ?   Mental Status: She is oriented to person, place, and time.  ?   Cranial Nerves: No cranial nerve deficit.  ?   Motor: No abnormal muscle tone.  ?   Coordination: Coordination normal.  ?   Deep Tendon Reflexes: Reflexes normal.  ?Psychiatric:     ?   Behavior: Behavior normal.     ?   Thought Content: Thought content normal.     ?   Judgment: Judgment normal.  ? ? ?Lab Results  ?Component Value Date  ? WBC 7.6 01/15/2022  ? HGB 13.5 01/15/2022  ? HCT 39.9 01/15/2022  ? PLT 214 01/15/2022  ? GLUCOSE 117 (H) 01/15/2022  ? CHOL 175 11/04/2020  ? TRIG 69 11/04/2020  ? HDL 102 11/04/2020  ? [place] 60 11/04/2020  ? ALT 28 01/15/2022  ? AST 32 01/15/2022  ? NA 132  (L) 01/15/2022  ? K 4.7 01/15/2022  ? CL 100 01/15/2022  ? CREATININE 0.98 01/15/2022  ? BUN 11 01/15/2022  ? CO2  21 (L) 01/15/2022  ? TSH 4.86 01/02/2022  ? INR 1.0 09/01/2021  ? HGBA1C 5.8 (H) 01/30/2015  ? ? ?CT ABDOMEN PELVIS W CONTRAST ? ?Result Date: 01/15/2022 ?CLINICAL DATA:  Lower rectal bleeding EXAM: CT ABDOMEN AND PELVIS WITH CONTRAST TECHNIQUE: Multidetector CT imaging of the abdomen and pelvis was performed using the standard protocol following bolus administration of intravenous contrast. RADIATION DOSE REDUCTION: This exam was performed according to the departmental dose-optimization program which includes automated exposure control, adjustment of the mA and/or kV according to patient size and/or use of iterative reconstruction technique. CONTRAST:  139m OMNIPAQUE IOHEXOL 300 MG/ML  SOLN COMPARISON:  CT abdomen/pelvis 09/01/2021 FINDINGS: Lower chest: Lung bases are clear. Mitral annular calcifications are noted. The imaged heart is otherwise unremarkable. Hepatobiliary: A subcentimeter hypodense lesion in the left hepatic lobe is too small to characterize, but likely reflects a small cyst. No specific imaging follow-up is required. The liver is otherwise unremarkable. The gallbladder is surgically absent. There is mild prominence of the extrahepatic bile ducts likely due to the cholecystectomy. There is no intrahepatic biliary ductal dilatation. Pancreas: Unremarkable Spleen: Unremarkable. Adrenals/Urinary Tract: The adrenals are unremarkable. There is a 3 mm nonobstructing right upper pole renal stone. There are no other stones. There are no focal parenchymal lesions. There is symmetric excretion of contrast into the collecting systems on the delayed images. The bladder is unremarkable. Stomach/Bowel: The stomach is unremarkable. There is no evidence of bowel obstruction. There is mild thickening of the colonic wall from the descending colon through the sigmoid colon to the rectum with mild  stranding in the surrounding fat. There are a few colonic diverticuli. The appendix is normal. Vascular/Lymphatic: The abdominal aorta is normal in course and caliber. The major branch vessels are patent. The main portal

## 2022-01-17 ENCOUNTER — Telehealth: Payer: Self-pay | Admitting: Physician Assistant

## 2022-01-17 NOTE — Telephone Encounter (Signed)
Paged by answering service.  Patient currently being treated for zoster pain and rest.  She had sudden onset palpitation this afternoon.  Blood pressure was 117/81.  Heart rate 106.  She thought she was in atrial fibrillation and took metoprolol 25 mg.  30 minutes later she felt better with heart rate of 70s.  Felt like see back to normal rhythm. ? ?Unfortunately, patient was only taking metoprolol in the morning.  I have recommended to take metoprolol, flecainide and Eliquis twice a day.  Reviewed risk and benefit in details.  ? ?She will call us if recurrent symptoms.  Discussed her symptoms will improve with resolution of zoster infection. ?

## 2022-01-18 ENCOUNTER — Ambulatory Visit (INDEPENDENT_AMBULATORY_CARE_PROVIDER_SITE_OTHER): Payer: Medicare Other | Admitting: Psychology

## 2022-01-18 DIAGNOSIS — F331 Major depressive disorder, recurrent, moderate: Secondary | ICD-10-CM | POA: Diagnosis not present

## 2022-01-18 NOTE — Progress Notes (Signed)
? ?  PROGRESS NOTE ? ?Name: Amanda Wilkins ?Date: 01/18/2022 ?MRN: [id number] ?DOB: 1945/12/25 ?PCP: Plotnikov, Evie Lacks, MD ? ?Time spent: 1:00 - 1:55 PM ? ? ?Today I met with  [name] in remote video (WebEx) face-to-face individual psychotherapy as an accomodation to the COVID-19 Pandemic. ? ?Distance Site: Client's Home ?Orginating Site: Dr Jannifer Franklin Remote Office ?Consent: Obtained verbal consent to transmit  session remotely  ? ? ?Individualized Treatment Plan ?Strengths: bright, verbal, motivated  ?Supports: family  ? ?Goal/Needs for Treatment:  ?In order of importance to patient ?1) Increase sense of self confidence and agency ?2) Decrease co-dependent behavior and focus more on her own physical and mental health ?3) Decrease depression  ? ?Client Statement of Needs: wants to regain her confidence, not always focus on helping others instead of herself,   ? ?Treatment Level: Outpatient Individual Psychotherapy  ?Symptoms: socially w/d, frequent negative thinking, sad mood, loss of motivation,   ?Client Treatment Preferences: Previous couple's therapist  ? ?Healthcare consumer's goal for treatment:   ? ?Psychologist, Royetta Crochet, Ph. D. will support the patient's ability to achieve the goals identified. Psychoeducation, Cognitive Behavioral Therapy, Dialectical Behavioral Therapy, Motivational Interviewing, and other evidenced-based practices will be used to promote progress towards healthy functioning.  ? ?Healthcare consumer will: Actively participate in therapy, working towards healthy functioning.  ?  ?*Justification for Continuation/Discontinuation of Goal: R=Revised, O=Ongoing, A=Achieved, D=Discontinued ? ?Goal 1) Increase sense of self confidence and agency ? ?Target Date Goal Was reviewed Status Code Progress towards goal/Likert rating  ?12/29/2022          O   ?     ?     ? ?Goal 2) Decrease co-dependent behavior and focus more on her own physical and mental health ? ?Target Date Goal  Was reviewed Status Code Progress towards goal/Likert rating  ?12/29/2022           O   ?     ?     ? ?Goal 3) Decrease depression ? ?Target Date Goal Was reviewed Status Code Progress towards goal/Likert rating  ?12/29/2022            O   ?     ?     ? ?This plan has been reviewed and created by the following participants:  This plan will be reviewed at least every 12 months. ?Date Behavioral Health Clinician Date Guardian/Patient   ?12/28/2021 Royetta Crochet, Ph.D.  12/28/2021 [name]  ?     ?     ?     ?  ? ?Diagnoses:  ?Major Depressive Disorder, recurrent, moderate ? ? ?Shamone reports that she continues to have a great deal of pain from the Shingles.  I noted that she was ruminating and catastrophizing about her pain.  We were then able to d/ how these thinking styles impact and intensify her pain.  I then provided some psych education on how to use CBT and DBT skills to better manage her pain.  Nikira agreed to try some of the strategies we d/ in session. ? ? ?Royetta Crochet, PhD  ? ? ? ? ? ? ? ? ? ? ? ? ? ? ? ? ?Royetta Crochet, PhD ?

## 2022-01-22 ENCOUNTER — Encounter: Payer: Self-pay | Admitting: Orthopaedic Surgery

## 2022-01-22 ENCOUNTER — Telehealth: Payer: Self-pay | Admitting: Orthopaedic Surgery

## 2022-01-22 ENCOUNTER — Ambulatory Visit (INDEPENDENT_AMBULATORY_CARE_PROVIDER_SITE_OTHER): Payer: Medicare Other | Admitting: Orthopaedic Surgery

## 2022-01-22 VITALS — Ht 65.0 in | Wt 234.0 lb

## 2022-01-22 DIAGNOSIS — M25551 Pain in right hip: Secondary | ICD-10-CM | POA: Diagnosis not present

## 2022-01-22 DIAGNOSIS — M1611 Unilateral primary osteoarthritis, right hip: Secondary | ICD-10-CM

## 2022-01-22 NOTE — Telephone Encounter (Signed)
See phone note.. Pt has appt 01/23/22.[name] KitchenJohny Wilkins ?

## 2022-01-22 NOTE — Progress Notes (Signed)
The patient is continuing to deal with significant arthritis of her right hip.  Her BMI is down to 38.94.  She is now dealing with getting over shingles and the pain from shingles involving her right arm.  Her primary care physician is treating her for this.  She is on Eliquis.  She understands that she would need to be off of this 3 complete days from her surgical standpoint so safe spinal anesthesia can be performed.  At this point her right hip pain is detrimentally affecting her mobility, her quality of life and her actives daily living.  A MRI of the right hip shows full-thickness cartilage loss.  Steroid injection has not helped either. ? ?I can still put her right hip to internal/external rotation but is very painful in the groin when I rotate her hip.  She is also experiencing right-sided low back pain.  There is not a sciatic component.  I do feel that some of her pain in her low back is related to the hip as well as her weight and hopefully as she continues on a weight loss journey and eventually has her hip replaced that this will help with her back as well. ? ?She understands that we will need cardiac clearance before scheduling surgery.  She said that she will hold off on scheduling this until she is just a little bit better from a health standpoint but she has her surgery scheduler's card and she is interested in potentially calling soon to have this scheduled sometime in the summer but she still needs to see her cardiologist for clearance. ?

## 2022-01-22 NOTE — Assessment & Plan Note (Signed)
Continue with Eliquis, Atenolol, flecainide  

## 2022-01-22 NOTE — Assessment & Plan Note (Signed)
No recent relapse 

## 2022-01-22 NOTE — Telephone Encounter (Signed)
Pt calling stating cardiologist stated they would need a call from Dr. Trevor Mace office for them to verify that pt is planning to have surg and needs clearance from Dr. [name]. The best phone number for their office is [phone]. Pt asked to be called Dr. Alan Ripper office has been called at [phone].  ?

## 2022-01-23 ENCOUNTER — Ambulatory Visit: Payer: Medicare Other | Admitting: Internal Medicine

## 2022-01-23 NOTE — Telephone Encounter (Signed)
I called patient and discussed.  She needs sooner cardiac clearance appt than May so she can get surgery in June.  I advised I would fax clearance request.  She will call them tomorrow for an appt. ?

## 2022-01-24 ENCOUNTER — Encounter: Payer: Self-pay | Admitting: Orthopaedic Surgery

## 2022-01-24 ENCOUNTER — Telehealth: Payer: Self-pay | Admitting: *Deleted

## 2022-01-24 ENCOUNTER — Ambulatory Visit (INDEPENDENT_AMBULATORY_CARE_PROVIDER_SITE_OTHER): Payer: Medicare Other | Admitting: Orthopaedic Surgery

## 2022-01-24 ENCOUNTER — Ambulatory Visit (INDEPENDENT_AMBULATORY_CARE_PROVIDER_SITE_OTHER): Payer: Medicare Other

## 2022-01-24 DIAGNOSIS — M1712 Unilateral primary osteoarthritis, left knee: Secondary | ICD-10-CM

## 2022-01-24 DIAGNOSIS — I700 Atherosclerosis of aorta: Secondary | ICD-10-CM | POA: Insufficient documentation

## 2022-01-24 NOTE — Telephone Encounter (Signed)
See other clearance note from today - this procedure is canceled currently and knee replacement is being done first. ?

## 2022-01-24 NOTE — Telephone Encounter (Signed)
Pt agreeable to plan of care for tele pre op appt 01/29/22 @ 9:20. Med rec and consent have been done.  Pt tells me she just left the surgeons office and they are going to do her knee surgery first, so the surgeon office will fax over a new clearance with the update surgery information. Once we have the new clearance I will note the chart with the updated clearance.  ?
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
## 2022-01-24 NOTE — Telephone Encounter (Signed)
Patient with diagnosis of afib on Eliquis for anticoagulation.   ? ?Procedure: left TKA ?Date of procedure: TBD ? ?CHA2DS2-VASc Score = 6  ?This indicates a 9.7% annual risk of stroke. ?The patient's score is based upon: ?CHF History: 1 ?HTN History: 1 ?Diabetes History: 0 ?Stroke History: 0 ?Vascular Disease History: 1 ?Age Score: 2 ?Gender Score: 1 ?  ?CrCl 59m/min using adjusted body weight due to obesity ?Platelet count 214K ? ?Per office protocol, patient can hold Eliquis for 3 days prior to procedure.   ?

## 2022-01-24 NOTE — Telephone Encounter (Signed)
? ?  Pre-operative Risk Assessment  ?  ?Patient Name: Amanda Wilkins  ?DOB: 22-Jun-1946 ?MRN: [id number]  ? ?  ?PREVIOUS SURGERY LISTED FOR HIP SURGERY HAS BEEN CANCELLED. KNEE SURGERY TO BE DONE AT THIS TIME PER DR. [facility] NOTES AND THE PT.  ?Request for Surgical Clearance   ? ?Procedure:   LEFT KNEE TOTAL ARTHROPLASTY ? ?Date of Surgery:  Clearance TBD                              ?   ?Surgeon:  DR. Jean Rosenthal ?Surgeon's Group or Practice Name:  [facility] [place] ?Phone number:  [phone] ?Fax number:  [phone] ATTN:SHERRIE ?  ?Type of Clearance Requested:   ?- Medical  ?- Pharmacy:  Hold Apixaban (Eliquis) x 3 DAYS PRIOR TO SURGERY ?  ?Type of Anesthesia:  Spinal ?  ?Additional requests/questions:   ? ?Signed, ?Julaine Hua   ?01/24/2022, 11:48 AM  ? ?

## 2022-01-24 NOTE — Telephone Encounter (Signed)
? ?  Pre-operative Risk Assessment  ?  ?Patient Name: Tae Robak  ?DOB: Aug 07, 1946 ?MRN: [id number]  ? ?  ? ?Request for Surgical Clearance   ? ?Procedure:   RIGHT TOTAL HIP ARTHROPLASTY ? ?Date of Surgery:  Clearance TBD                              ?   ?Surgeon:  DR. Jean Rosenthal ?Surgeon's Group or Practice Name:  [facility] [place] ?Phone number:  [phone] ?Fax number:  [phone] ATTN: SHERRIE ?  ?Type of Clearance Requested:   ?- Medical  ?- Pharmacy:  Hold Apixaban (Eliquis)   ?  ?Type of Anesthesia:  Spinal ?  ?Additional requests/questions:   ? ?Signed, ?Julaine Hua   ?01/24/2022, 10:13 AM  ? ?

## 2022-01-24 NOTE — Telephone Encounter (Signed)
Primary Cardiologist:Paula [name], MD ? ?Chart reviewed as part of pre-operative protocol coverage. Because of Malayja Freund past medical history and time since last visit, he/she will require a follow-up visit in order to better assess preoperative cardiovascular risk. ? ?Pre-op covering staff: ?- Patient has appointment with Dr. [name] on 03/06/22, however we can conduct a virtual visit prior to that date if she would like to schedule surgery for sooner. Dr. [name] has noted that she is at low risk for surgery with the exception of developing atrial fibrillation with RVR.  ? ?- Please contact requesting surgeon's office via preferred method (i.e, phone, fax) to inform them of need for appointment prior to surgery. ? ?This message will also be routed to pharmacy pool  for input on holding anticoagulant agent as requested below so that this information is available at time of patient's appointment.  ? ?Emmaline Life, NP-C ? ?  ?01/24/2022, 11:06 AM ?[facility] ?[address] ?Office [phone] Fax [phone] ? ?

## 2022-01-25 ENCOUNTER — Ambulatory Visit: Payer: Medicare Other | Admitting: Psychology

## 2022-01-26 ENCOUNTER — Encounter: Payer: Self-pay | Admitting: Internal Medicine

## 2022-01-26 ENCOUNTER — Telehealth (INDEPENDENT_AMBULATORY_CARE_PROVIDER_SITE_OTHER): Payer: Medicare Other | Admitting: Internal Medicine

## 2022-01-26 DIAGNOSIS — B0229 Other postherpetic nervous system involvement: Secondary | ICD-10-CM | POA: Diagnosis not present

## 2022-01-26 MED ORDER — GABAPENTIN 100 MG PO CAPS: 100.0000 mg | ORAL_CAPSULE | Freq: Four times a day (QID) | ORAL | 3 refills | Status: DC

## 2022-01-26 MED ORDER — HYDROCODONE-ACETAMINOPHEN 10-325 MG PO TABS: 1.0000 | ORAL_TABLET | ORAL | 0 refills | Status: AC | PRN

## 2022-01-26 NOTE — Patient Instructions (Signed)
CBD gummies 10-20 mg 3 times a day ? ?Blue-Emu cream was recommended to use 2-3 times a day ? ?Zostrix cream ? ?Zynex TENS unit? ? ?

## 2022-01-29 ENCOUNTER — Encounter: Payer: Self-pay | Admitting: Physician Assistant

## 2022-01-29 ENCOUNTER — Ambulatory Visit (INDEPENDENT_AMBULATORY_CARE_PROVIDER_SITE_OTHER): Payer: Medicare Other | Admitting: Physician Assistant

## 2022-01-29 DIAGNOSIS — Z01810 Encounter for preprocedural cardiovascular examination: Secondary | ICD-10-CM | POA: Diagnosis not present

## 2022-01-29 NOTE — Progress Notes (Addendum)
? ?Virtual Visit via Telephone Note  ? ?This visit type was conducted due to national recommendations for restrictions regarding the COVID-19 Pandemic (e.g. social distancing) in an effort to limit this patient's exposure and mitigate transmission in our community.  Due to her co-morbid illnesses, this patient is at least at moderate risk for complications without adequate follow up.  This format is felt to be most appropriate for this patient at this time.  The patient did not have access to video technology/had technical difficulties with video requiring transitioning to audio format only (telephone).  All issues noted in this document were discussed and addressed.  No physical exam could be performed with this format.  Please refer to the patient's chart for her  consent to telehealth for [facility]. ? ?Evaluation Performed:  Preoperative cardiovascular risk assessment ?_____________  ? ?Date:  01/29/2022  ? ?Patient ID:  Amanda Wilkins, DOB 04/05/1946, MRN [id number] ?Patient Location:  ?Home ?Provider location:   ?Office ? ?Primary Care Provider:  [name], MD ?Primary Cardiologist:  [name], MD ? ?Chief Complaint  ?  ?76 y.o. y/o female with a h/o PAF, SVT, chronic anticoagulation and flecainide use, HTN, depression, migraines, hyponatremia, mild carotid artery disease, OSA, HLD, atherosclerosis of aorta who is pending knee surgery, and presents today for telephonic preoperative cardiovascular risk assessment. Prior coronary calcium score was zero in 06/2019. Last echo 11/2019 EF 55-60%, mild LVH, normal diastolic parameters, mild MR.  ? ?Past Medical History  ?  ?Past Medical History:  ?Diagnosis Date  ? Acute bronchitis 09/11/2016  ? 11/17 refractory  ? Acute cystitis without hematuria 05/17/2015  ? Acute kidney injury ([place])   ? Labs today  ? Acute right ankle pain 09/09/2018  ? Anticoagulant long-term use   ? Xarelto  ? Axillary pain   ? Bronchitis   ? Cellulitis   ? Cholecystitis   ?  Chronic interstitial nephritis   ? CONJUNCTIVITIS, ACUTE 10/18/2010  ? Qualifier: Diagnosis of  By: Plotnikov MD, Evie Lacks   ? D-dimer, elevated   ? Depression   ? Eye inflammation   ? GERD (gastroesophageal reflux disease)   ? History of interstitial nephritis 2016  ? chronic  ? History of kidney stones   ? has a small one found on xray  ? History of nuclear stress test 12/16/2014  ? Intermediate risk nuclear study w/ medium size moderate severity reversible defect in the basal and mid inferolateral and inferior wall (per dr cardiology note , dr [name] did not think this was consistent with ischemia)/  normal LV function and wall motion, ef 75%  ? History of septic shock 01/22/2015  ? in setting Group A Strep Cellulits erysipelas/chest wall induration with streptoccocus basterium-- Severe sepsis, DIC, Acute respiratory failure with pulmonary edema, Acute Kidney failure with chronic interstitial nephritis  ? Hypertension   ? Hyponatremia   ? Migraines   ? on Zoloft for migraines  ? Mild carotid artery disease ([place])   ? per duplex 08-30-2017 bilateral ICA 1-39%  ? OA (osteoarthritis) rheumotologist-  dr [name]  ? both knees,  shoulders, ankles  ? OSA on CPAP   ?  moderate obstructive sleep apnea with an AHI of 23.4/h and oxygen desaturations as low as 84%.  Now on CPAP at 12 cm H2O.  ? PAF (paroxysmal atrial fibrillation) ([place]) 2009  ? cardiologist-- dr [name]  ? Paroxysmal atrial flutter ([place])   ? a. dx 11/2017.  ? PSVT (paroxysmal supraventricular  tachycardia) ([place])   ? Wears contact lenses   ? ?Past Surgical History:  ?Procedure Laterality Date  ? BREAST BIOPSY Right 05/15/2017  ? [place]  ? CARDIOVERSION N/A 08/28/2021  ? Procedure: CARDIOVERSION;  Surgeon: Fay Records, MD;  Location: [place];  Service: Cardiovascular;  Laterality: N/A;  ? [place]  ? CHOLECYSTECTOMY N/A 11/16/2021  ? Procedure: LAPAROSCOPIC CHOLECYSTECTOMY;  Surgeon: Ralene Ok, MD;  Location: [place];   Service: General;  Laterality: N/A;  ? ESOPHAGOGASTRODUODENOSCOPY N/A 02/08/2015  ? Procedure: ESOPHAGOGASTRODUODENOSCOPY (EGD);  Surgeon: [name], MD;  Location: [place];  Service: Endoscopy;  Laterality: N/A;  ? KNEE ARTHROSCOPY W/ MENISCAL REPAIR Left 08/2014    '@[facility]'$   ? SHOULDER SURGERY Right 04/04/2016  ? TOTAL KNEE ARTHROPLASTY Right 09/01/2018  ? Procedure: RIGHT TOTAL KNEE ARTHROPLASTY;  Surgeon: [name], MD;  Location: [facility];  Service: Orthopedics;  Laterality: Right;  ? ? ?Allergies ? ?Allergies  ?Allergen Reactions  ? Epinephrine Base Other (See Comments)  ?  Seriously increases heart rate  ? Aspirin Other (See Comments)  ?  Can take the coated 325 mg. Plain asa 325 mg speeds up the heart.  ? Benadryl [Diphenhydramine]   ?  Increased heart rate  ? Covid-19 Ad26 Vaccine([place]) Hives  ?   hives 6 hrs after her COVID 19 2nd booster - resolved ?  ? Fish Allergy Hives  ?  Was imitation crab meat and broke out in hives, had skin testing for lobster and showed allergic ?Was imitation crab meat and broke out in hives, had skin testing for lobster and showed allergic  ? Hylan G-F 20 Other (See Comments)  ?  Very painful (knee injection)  ? Penicillins Other (See Comments)  ?  Pt previously has been told not to take because of family history of reactions (water blisters). She took amoxicillin in 2012-2013 with no reaction ?Pt received ancef on 11-16-2021 without issue  ? Tape Other (See Comments)  ?  Blisters from adhesive tape ?Other reaction(s): Other (See Comments) ?Blisters from adhesive tape ?Other reaction(s): Other (See Comments) ?Blisters from adhesive tape  ? Wound Dressing Adhesive   ?  Other reaction(s): Other (See Comments) ?Blisters from adhesive tape  ? ? ?History of Present Illness  ?  ?Amanda Wilkins is a 76 y.o. female who presents via audio/video conferencing for a telehealth visit today.  Pt was last seen in cardiology clinic on 09/2021 by Dr. [name].  At that time Amanda Wilkins was doing well. Though nuclear stress test was ordered 08/2021 by the afib clinic, it was never performed - per follow-up notes, no longer recommended per doctor. Dr. [name] assessed the patient for preop at last visit and felt she was at low risk for major cardiac event other than atrial fibrillation and recommended following on telemetry. The patient is now pending L TKA.  Since her last visit, she has been feeling well without any new chest pain, shortness of breath, breakthrough atrial fibrillation or palpitations. She has been tracking her BP since addition of amlodipine and states it has run better though does not know overt readings at this time. ? ? ?Home Medications  ?  ?Prior to Admission medications   ?Medication Sig Start Date End Date Taking? Authorizing Provider  ?acetaminophen (TYLENOL) 500 MG tablet Take 1,000 mg by mouth daily as needed (pain).    [provider]  ?amLODipine (NORVASC) 2.5 MG tablet Take 0.5 tablets (1.25 mg total) by mouth daily.  11/15/21   Fay Records, MD  ?Biotin 1000 MCG tablet Take 1,000 mcg by mouth in the morning.    [provider]  ?cholecalciferol (VITAMIN D3) 25 MCG (1000 UT) tablet Take 1,000 Units by mouth daily.    [provider]  ?clobetasol (TEMOVATE) 0.05 % external solution  11/29/21   [provider]  ?desonide (DESOWEN) 0.05 % ointment Apply topically. 11/29/21   [provider]  ?DILT-XR 180 MG 24 hr capsule  11/29/21   [provider]  ?ELIQUIS 5 MG TABS tablet Take 5 mg by mouth 2 (two) times daily. 01/12/22   [provider]  ?EPINEPHrine 0.3 mg/0.3 mL IJ SOAJ injection Inject 0.3 mg into the muscle as needed for anaphylaxis. As needed for life-threatening allergic reactions ?Patient taking differently: Inject 0.3 mg into the muscle once as needed for anaphylaxis. 08/03/20   Kennith Gain, MD  ?estradiol (VIVELLE-DOT) 0.025 MG/24HR Place 1 patch onto the skin 2 (two) times a  week. ?Patient not taking: Reported on 01/24/2022 11/29/21   [provider]  ?finasteride (PROSCAR) 5 MG tablet  11/29/21   [provider]  ?flecainide (TAMBOCOR) 50 MG tablet Take 1.5 tablets (7

## 2022-02-01 ENCOUNTER — Encounter: Payer: Self-pay | Admitting: Internal Medicine

## 2022-02-01 ENCOUNTER — Ambulatory Visit: Payer: Medicare Other | Admitting: Psychology

## 2022-02-01 ENCOUNTER — Telehealth (INDEPENDENT_AMBULATORY_CARE_PROVIDER_SITE_OTHER): Payer: Medicare Other | Admitting: Internal Medicine

## 2022-02-01 DIAGNOSIS — B0229 Other postherpetic nervous system involvement: Secondary | ICD-10-CM

## 2022-02-01 MED ORDER — GABAPENTIN 300 MG PO CAPS: 300.0000 mg | ORAL_CAPSULE | Freq: Three times a day (TID) | ORAL | 3 refills | Status: DC

## 2022-02-01 NOTE — Progress Notes (Signed)
Virtual Visit via Video Note ? ?I connected with Amanda Wilkins on 02/01/22 at 11:40 AM EDT by a video enabled telemedicine application and verified that I am speaking with the correct person using two identifiers. ?  ?I discussed the limitations of evaluation and management by telemedicine and the availability of in person appointments. The patient expressed understanding and agreed to proceed. ? ?I was located at our [facility] office. ?The patient was at home. ?There was no one else present in the visit. ? ?No chief complaint on file. ?  ? ?History of Present Illness: ? ?F/u on neuralgia ?Better on Norco (rare) and Gabapentin 300 mg tid ?Pain is 2-3/10 ? ?Review of Systems  ?Constitutional:  Negative for fever.  ?Neurological:  Positive for weakness. Negative for dizziness and loss of consciousness.  ? ? ?Observations/Objective: ?The patient appears to be in no acute distress ? ?Looks better - not in pain ? ?Assessment and Plan: ? ?Problem List Items Addressed This Visit   ? ? Postherpetic neuralgia  ?  Better on Norco (rare) and Gabapentin 300 mg tid ? Potential benefits of a short or  long term opioids use as well as potential risks (i.e. addiction risk, apnea etc) and complications (i.e. Somnolence, constipation and others) were explained to the patient and were aknowledged. ?Blue-Emu cream -use 2-3 times a day ?Zostrix cream ?RTC 4 wks - ok VOV ? ?  ?  ? ? ? ?Meds ordered this encounter  ?Medications  ? gabapentin (NEURONTIN) 300 MG capsule  ?  Sig: Take 1 capsule (300 mg total) by mouth 3 (three) times daily.  ?  Dispense:  90 capsule  ?  Refill:  3  ?  ? ?Follow Up Instructions: ? ?  ?I discussed the assessment and treatment plan with the patient. The patient was provided an opportunity to ask questions and all were answered. The patient agreed with the plan and demonstrated an understanding of the instructions. ?  ?The patient was advised to call back or seek an in-person evaluation if the symptoms  worsen or if the condition fails to improve as anticipated. ? ?I provided face-to-face time during this encounter. We were at different locations. ? ? ?[name], MD ? ?

## 2022-02-01 NOTE — Assessment & Plan Note (Signed)
Better on Norco (rare) and Gabapentin 300 mg tid ? Potential benefits of a short or  long term opioids use as well as potential risks (i.e. addiction risk, apnea etc) and complications (i.e. Somnolence, constipation and others) were explained to the patient and were aknowledged. ?Blue-Emu cream -use 2-3 times a day ?Zostrix cream ?RTC 4 wks - ok VOV ? ?

## 2022-02-05 NOTE — Assessment & Plan Note (Addendum)
Better on Norco (rare) and Gabapentin 300 mg tid ?Blue-Emu cream -use 2-3 times a day ?Zostrix cream ?One try CBD gummies 10-20 mg 3 times a day ?We can get a Zynex TENS unit ?

## 2022-02-05 NOTE — Progress Notes (Signed)
Virtual Visit via Video Note ? ?I connected with Amanda Wilkins on 02/05/22 at 11:00 AM EDT by a video enabled telemedicine application and verified that I am speaking with the correct person using two identifiers. ?  ?I discussed the limitations of evaluation and management by telemedicine and the availability of in person appointments. The patient expressed understanding and agreed to proceed. ? ?I was located at our [facility] office. ?The patient was at home. ?There was no one else present in the visit. ? ?No chief complaint on file. ?  ? ?History of Present Illness: ? ?Follow-up on postherpetic neuralgia. The pain is better on gabapentin and topical treatment.  She is able to sleep ?ROS ?Pain location is unchanged ? ?Observations/Objective: ?The patient appears to be in no acute distress ? ?Assessment and Plan: ? ?Problem List Items Addressed This Visit   ? ? Postherpetic neuralgia  ?  Better on Norco (rare) and Gabapentin 300 mg tid ?Blue-Emu cream -use 2-3 times a day ?Zostrix cream ?One try CBD gummies 10-20 mg 3 times a day ?We can get a Zynex TENS unit ?  ?  ? ? ? ?Meds ordered this encounter  ?Medications  ? DISCONTD: gabapentin (NEURONTIN) 100 MG capsule  ?  Sig: Take 1-3 capsules (100-300 mg total) by mouth 4 (four) times daily.  ?  Dispense:  120 capsule  ?  Refill:  3  ? HYDROcodone-acetaminophen (NORCO) 10-325 MG tablet  ?  Sig: Take 1 tablet by mouth every 4 (four) hours as needed for up to 5 days for severe pain.  ?  Dispense:  120 tablet  ?  Refill:  0  ?  ? ?Follow Up Instructions: ? ?  ?I discussed the assessment and treatment plan with the patient. The patient was provided an opportunity to ask questions and all were answered. The patient agreed with the plan and demonstrated an understanding of the instructions. ?  ?The patient was advised to call back or seek an in-person evaluation if the symptoms worsen or if the condition fails to improve as anticipated. ? ?I provided face-to-face  time during this encounter. We were at different locations. ? ? ?[name], MD ? ?

## 2022-02-08 ENCOUNTER — Ambulatory Visit (INDEPENDENT_AMBULATORY_CARE_PROVIDER_SITE_OTHER): Payer: Medicare Other | Admitting: Psychology

## 2022-02-08 DIAGNOSIS — F331 Major depressive disorder, recurrent, moderate: Secondary | ICD-10-CM | POA: Diagnosis not present

## 2022-02-08 NOTE — Progress Notes (Signed)
? ? ? ? ? ? ?  PROGRESS NOTE ? ?Name: Amanda Wilkins ?Date: 02/08/2022 ?MRN: [id number] ?DOB: 10-Apr-1946 ?PCP: Plotnikov, Evie Lacks, MD ? ?Time spent: 1:00 - 1:55 PM ? ? ?Today I met with  [name] in remote video (WebEx) face-to-face individual psychotherapy as an accomodation to the COVID-19 Pandemic. ? ?Distance Site: Client's Home ?Orginating Site: Dr Jannifer Franklin Remote Office ?Consent: Obtained verbal consent to transmit  session remotely  ? ? ?Individualized Treatment Plan ?Strengths: bright, verbal, motivated  ?Supports: family  ? ?Goal/Needs for Treatment:  ?In order of importance to patient ?1) Increase sense of self confidence and agency ?2) Decrease co-dependent behavior and focus more on her own physical and mental health ?3) Decrease depression  ? ?Client Statement of Needs: wants to regain her confidence, not always focus on helping others instead of herself,   ? ?Treatment Level: Outpatient Individual Psychotherapy  ?Symptoms: socially w/d, frequent negative thinking, sad mood, loss of motivation,   ?Client Treatment Preferences: Previous couple's therapist  ? ?Healthcare consumer's goal for treatment:   ? ?Psychologist, Royetta Crochet, Ph. D. will support the patient's ability to achieve the goals identified. Psychoeducation, Cognitive Behavioral Therapy, Dialectical Behavioral Therapy, Motivational Interviewing, and other evidenced-based practices will be used to promote progress towards healthy functioning.  ? ?Healthcare consumer will: Actively participate in therapy, working towards healthy functioning.  ?  ?*Justification for Continuation/Discontinuation of Goal: R=Revised, O=Ongoing, A=Achieved, D=Discontinued ? ?Goal 1) Increase sense of self confidence and agency ? ?Target Date Goal Was reviewed Status Code Progress towards goal/Likert rating  ?12/29/2022          O   ?     ?     ? ?Goal 2) Decrease co-dependent behavior and focus more on her own physical and mental health ? ?Target  Date Goal Was reviewed Status Code Progress towards goal/Likert rating  ?12/29/2022           O   ?     ?     ? ?Goal 3) Decrease depression ? ?Target Date Goal Was reviewed Status Code Progress towards goal/Likert rating  ?12/29/2022            O   ?     ?     ? ?This plan has been reviewed and created by the following participants:  This plan will be reviewed at least every 12 months. ?Date Behavioral Health Clinician Date Guardian/Patient   ?12/28/2021 Royetta Crochet, Ph.D.  12/28/2021 [name]  ?     ?     ?     ?  ? ?Diagnoses:  ?Major Depressive Disorder, recurrent, moderate ? ? ?[name] reports that she is feeling better from the Shingles.  Her husband had surgery and they are making the most of things.  She shared a number of medical issues she is managing and that her knee surgery got postponed until August.  [name]'s son and his family will be here for the month of June.  We d/ what she has planned and what it's like to enjoy her granddaughter's company.  We also d/ ways for her to create glimmers of joy and hope for herself as a means to lift her spirits. ? ? ? ?Royetta Crochet, PhD  ? ? ?

## 2022-02-14 DIAGNOSIS — Z20822 Contact with and (suspected) exposure to covid-19: Secondary | ICD-10-CM | POA: Diagnosis not present

## 2022-02-15 ENCOUNTER — Ambulatory Visit: Payer: Medicare Other | Admitting: Psychology

## 2022-02-15 DIAGNOSIS — Z20822 Contact with and (suspected) exposure to covid-19: Secondary | ICD-10-CM | POA: Diagnosis not present

## 2022-02-19 ENCOUNTER — Other Ambulatory Visit: Payer: Self-pay

## 2022-02-19 ENCOUNTER — Ambulatory Visit (INDEPENDENT_AMBULATORY_CARE_PROVIDER_SITE_OTHER): Payer: Medicare Other | Admitting: Physical Therapy

## 2022-02-19 ENCOUNTER — Encounter: Payer: Self-pay | Admitting: Physical Therapy

## 2022-02-19 DIAGNOSIS — R2689 Other abnormalities of gait and mobility: Secondary | ICD-10-CM | POA: Diagnosis not present

## 2022-02-19 DIAGNOSIS — M25551 Pain in right hip: Secondary | ICD-10-CM | POA: Diagnosis not present

## 2022-02-19 DIAGNOSIS — M6281 Muscle weakness (generalized): Secondary | ICD-10-CM
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
## 2022-02-19 NOTE — Progress Notes (Deleted)
? ? ?Subjective:  ? ? Patient ID: Amanda Wilkins, female    DOB: 1946-09-18, 77 y.o.   MRN: [id number] ? ? ? ? ? ?HPI ?Amanda Wilkins is here for  ?Chief Complaint  ?Patient presents with  ? Diarrhea  ? ? ? ?Green diarrhea -  ? ? ? ?Medications and allergies reviewed with patient and updated if appropriate. ? ?Current Outpatient Medications on File Prior to Visit  ?Medication Sig Dispense Refill  ? acetaminophen (TYLENOL) 500 MG tablet Take 1,000 mg by mouth daily as needed (pain).    ? amLODipine (NORVASC) 2.5 MG tablet Take 0.5 tablets (1.25 mg total) by mouth daily. 90 tablet 3  ? Biotin 1000 MCG tablet Take 1,000 mcg by mouth in the morning.    ? cholecalciferol (VITAMIN D3) 25 MCG (1000 UT) tablet Take 1,000 Units by mouth daily.    ? clobetasol (TEMOVATE) 0.05 % external solution     ? desonide (DESOWEN) 0.05 % ointment Apply topically.    ? DILT-XR 180 MG 24 hr capsule  (Patient not taking: Reported on 01/24/2022)    ? ELIQUIS 5 MG TABS tablet Take 5 mg by mouth 2 (two) times daily.    ? EPINEPHrine 0.3 mg/0.3 mL IJ SOAJ injection Inject 0.3 mg into the muscle as needed for anaphylaxis. As needed for life-threatening allergic reactions (Patient taking differently: Inject 0.3 mg into the muscle once as needed for anaphylaxis.) 2 each 1  ? estradiol (VIVELLE-DOT) 0.025 MG/24HR Place 1 patch onto the skin 2 (two) times a week.    ? finasteride (PROSCAR) 5 MG tablet  (Patient not taking: Reported on 01/24/2022)    ? flecainide (TAMBOCOR) 50 MG tablet Take 1.5 tablets (75 mg total) by mouth 2 (two) times daily. 135 tablet 3  ? fluocinonide cream (LIDEX) 6.22 % Apply 1 application. topically 2 (two) times daily.    ? gabapentin (NEURONTIN) 300 MG capsule Take 1 capsule (300 mg total) by mouth 3 (three) times daily. 90 capsule 3  ? glycopyrrolate (ROBINUL) 1 MG tablet     ? hydrocortisone 2.5 % ointment Apply 1 application. topically 2 (two) times daily as needed.    ? hydroxychloroquine (PLAQUENIL) 200 MG tablet Take  400 mg by mouth every morning.    ? metoprolol tartrate (LOPRESSOR) 25 MG tablet Take 1 tablet (25 mg total) by mouth 2 (two) times daily. 30 tablet 11  ? Multiple Vitamin (MULTIVITAMIN WITH MINERALS) TABS tablet Take 1 tablet by mouth daily. Centrum for Women 50+    ? Probiotic Product (ALIGN) 4 MG CAPS Take 4 mg by mouth daily.    ? progesterone (PROMETRIUM) 200 MG capsule SMARTSIG:1 By Mouth (Patient not taking: Reported on 01/24/2022)    ? rosuvastatin (CRESTOR) 5 MG tablet Take 1 tablet (5 mg total) by mouth daily. 90 tablet 3  ? sertraline (ZOLOFT) 50 MG tablet TAKE 1 TABLET BY MOUTH DAILY 90 tablet 1  ? triamcinolone (KENALOG) 0.025 % ointment Apply topically.    ? ?No current facility-administered medications on file prior to visit.  ? ? ?Review of Systems ? ?   ?Objective:  ?There were no vitals filed for this visit. ?BP Readings from Last 3 Encounters:  ?01/16/22 120/82  ?01/15/22 (!) 147/66  ?01/04/22 (!) 178/88  ? ?Wt Readings from Last 3 Encounters:  ?01/22/22 234 lb (106.1 kg)  ?01/16/22 232 lb (105.2 kg)  ?12/25/21 237 lb 6.4 oz (107.7 kg)  ? ?There is no height or weight on file to calculate  BMI. ? ?  ?Physical Exam ?   ? ? ? ? ? ?Assessment & Plan:  ? ? ?See Problem List for Assessment and Plan of chronic medical problems.  ? ? ? ? ?

## 2022-02-20 ENCOUNTER — Ambulatory Visit (INDEPENDENT_AMBULATORY_CARE_PROVIDER_SITE_OTHER): Payer: Medicare Other | Admitting: Internal Medicine

## 2022-02-20 ENCOUNTER — Encounter: Payer: Self-pay | Admitting: Internal Medicine

## 2022-02-20 DIAGNOSIS — R110 Nausea: Secondary | ICD-10-CM | POA: Diagnosis not present

## 2022-02-20 DIAGNOSIS — R195 Other fecal abnormalities: Secondary | ICD-10-CM | POA: Diagnosis not present

## 2022-02-20 NOTE — Progress Notes (Signed)
Virtual Visit via Video Note ? ?I connected with Amanda Wilkins on 02/20/22 at 10:40 AM EDT by a video enabled telemedicine application and verified that I am speaking with the correct person using two identifiers. ?  ?I discussed the limitations of evaluation and management by telemedicine and the availability of in person appointments. The patient expressed understanding and agreed to proceed. ? ?Present for the visit:  Myself, Dr Billey Gosling, Amanda Wilkins.  The patient is currently at home and I am in the office.   ? ?No referring provider.  ? ? ?History of Present Illness: ?She is here for an acute visit.   ? ?Last week she had 1 episode of very loose that was practically diarrhea but not that watery.  She did not have any abdominal pain.  She did have a little bit of associated nausea, but since having had her gallbladder out she does have intermittent nausea.  She was most concerned because the stool was very green and she had not seen her stool ever look like that in the past.  The stool has returned to normal and she no longer is having diarrhea.  She denies any fevers.  She thinks around that time she was probably eating salads because she tends to eat salad on a regular basis and did have some blueberries. ? ?She wonders why she gets the nausea.  It does not always occur with certain foods or around eating, but it comes intermittently and did, after she had her gallbladder removed.  She denies associated vomiting, abdominal pain, reflux/GERD. ? ? ? ?Social History  ? ?Socioeconomic History  ? Marital status: Married  ?  Spouse name: Not on file  ? Number of children: 2  ? Years of education: Not on file  ? Highest education level: Not on file  ?Occupational History  ? Occupation: retired  ?  Employer: UNEMPLOYED  ? Occupation: Part-time (with temp agency)  ?Tobacco Use  ? Smoking status: Never  ? Smokeless tobacco: Never  ? Tobacco comments:  ?  H/O social smoking at times when drinking--never  a "habit" (in the 1970s)  ?Vaping Use  ? Vaping Use: Never used  ?Substance and Sexual Activity  ? Alcohol use: Yes  ?  Alcohol/week: 0.0 standard drinks  ?  Comment: Occasional  ? Drug use: Never  ? Sexual activity: Yes  ?  Partners: Male  ?  Birth control/protection: Post-menopausal  ?  Comment: intercourse age 45, sexual partners more than 5  ?Other Topics Concern  ? Not on file  ?Social History Narrative  ? Not on file  ? ?Social Determinants of Health  ? ?Financial Resource Strain: Low Risk   ? Difficulty of Paying Living Expenses: Not hard at all  ?Food Insecurity: No Food Insecurity  ? Worried About Charity fundraiser in the Last Year: Never true  ? Ran Out of Food in the Last Year: Never true  ?Transportation Needs: No Transportation Needs  ? Lack of Transportation (Medical): No  ? Lack of Transportation (Non-Medical): No  ?Physical Activity: Unknown  ? Days of Exercise per Week: Patient refused  ? Minutes of Exercise per Session: 30 min  ?Stress: No Stress Concern Present  ? Feeling of Stress : Not at all  ?Social Connections: Moderately Isolated  ? Frequency of Communication with Friends and Family: More than three times a week  ? Frequency of Social Gatherings with Friends and Family: More than three times a week  ? Attends Religious  Services: Never  ? Active Member of Clubs or Organizations: No  ? Attends Archivist Meetings: Never  ? Marital Status: Married  ? ?  ?Observations/Objective: ?Appears well in NAD ?Breathing normally ?Skin appears warm and dry ? ?Assessment and Plan: ? ?Abnormal stool color: ?New ?1 episode of very loose, green stool last week associated with some mild nausea, but no fever, abdominal pain, vomiting or other symptoms ?Stools returned to normal after that ?She was most concerned about the color, which I think can be easily explained by the salads and especially the blueberries that she ate ?Reassured her ?No further evaluation necessary ? ?Nausea: ?Chronic,  intermittent ?Since having had her gallbladder out she does have intermittent nausea that is sometimes not even associated with eating ?Denies GERD, vomiting and abdominal pain ?Having nausea after having the gallbladder removed is possible and I think that is probably something she may need to live with ?Encouraged her to try to find any pattern with the nausea see to see if there is any way that she can prevent it ? ? ?Follow Up Instructions: ? ?  ?I discussed the assessment and treatment plan with the patient. The patient was provided an opportunity to ask questions and all were answered. The patient agreed with the plan and demonstrated an understanding of the instructions. ?  ?The patient was advised to call back or seek an in-person evaluation if the symptoms worsen or if the condition fails to improve as anticipated. ? ? ? ?Binnie Rail, MD ? ?

## 2022-02-22 ENCOUNTER — Ambulatory Visit: Payer: Medicare Other | Admitting: Psychology

## 2022-02-27 ENCOUNTER — Ambulatory Visit: Payer: Medicare Other | Admitting: Psychology

## 2022-02-27 DIAGNOSIS — F331 Major depressive disorder, recurrent, moderate: Secondary | ICD-10-CM

## 2022-02-27 NOTE — Progress Notes (Unsigned)
° ° ° ° ° ° ° ° ° ° ° ° ° ° °  Caileb Rhue Ann [place] Stell, PhD °

## 2022-02-27 NOTE — Progress Notes (Unsigned)
?PROGRESS NOTE ? ?Name: Amanda Wilkins ?Date: 02/27/2022 ?MRN: [id number] ?DOB: 1946/06/12 ?PCP: Plotnikov, Evie Lacks, MD ? ?Time spent: 1:00 - 1:55 PM ? ? ?Annual Review: 12/29/2022 ? ?Today I met with  Amanda Wilkins in remote video (WebEx) face-to-face individual psychotherapy as an accomodation to the COVID-19 Pandemic. ? ?Distance Site: Client's Home ?Orginating Site: Dr Jannifer Franklin Remote Office ?Consent: Obtained verbal consent to transmit  session remotely  ? ? ?Individualized Treatment Plan ?Strengths: bright, verbal, motivated  ?Supports: family  ? ?Goal/Needs for Treatment:  ?In order of importance to patient ?1) Increase sense of self confidence and agency ?2) Decrease co-dependent behavior and focus more on her own physical and mental health ?3) Decrease depression  ? ?Client Statement of Needs: wants to regain her confidence, not always focus on helping others instead of herself,   ? ?Treatment Level: Outpatient Individual Psychotherapy  ?Symptoms: socially w/d, frequent negative thinking, sad mood, loss of motivation,   ?Client Treatment Preferences: Previous couple's therapist  ? ?Healthcare consumer's goal for treatment:   ? ?Psychologist, Royetta Crochet, Ph. D. will support the patient's ability to achieve the goals identified. Psychoeducation, Cognitive Behavioral Therapy, Dialectical Behavioral Therapy, Motivational Interviewing, and other evidenced-based practices will be used to promote progress towards healthy functioning.  ? ?Healthcare consumer will: Actively participate in therapy, working towards healthy functioning.  ?  ?*Justification for Continuation/Discontinuation of Goal: R=Revised, O=Ongoing, A=Achieved, D=Discontinued ? ?Goal 1) Increase sense of self confidence and agency ? ?Target Date Goal Was reviewed Status Code Progress towards goal/Likert rating  ?12/29/2022          O   ?     ?     ? ?Goal 2) Decrease co-dependent behavior and focus more on her own physical and mental  health ? ?Target Date Goal Was reviewed Status Code Progress towards goal/Likert rating  ?12/29/2022           O   ?     ?     ? ?Goal 3) Decrease depression ? ?Target Date Goal Was reviewed Status Code Progress towards goal/Likert rating  ?12/29/2022            O   ?     ?     ? ?This plan has been reviewed and created by the following participants:  This plan will be reviewed at least every 12 months. ?Date Behavioral Health Clinician Date Guardian/Patient   ?12/28/2021 Royetta Crochet, Ph.D.  12/28/2021 Amanda Wilkins  ?     ?     ?     ?  ? ?Diagnoses:  ?Major Depressive Disorder, recurrent, moderate ? ? ?Amanda Wilkins reports that both she and Amanda Wilkins have been struggling with depression.  She states that she doesn't know if she will be able to walk with pain or not.  She admits that she is hesitate at times to take her medication because it makes her feel bad.  I strongly encouraged her to reach out to her provider to d/ alternatives.  We also talked about doing something creative (a favorite pass time) to keep her mind occupied to distract from the pain and her negative mood states. ? ?We talked about how she spent her Mother's Day and the how hard it is to not have a relationship with her son's children.  I offered some ideas for how to keep the door open to her grandchildren so that when they are of age, they may choose to make a relationship with them despite  their mother's attitude. ? ? ? ?Royetta Crochet, PhD  ? ? ? ?

## 2022-03-01 ENCOUNTER — Ambulatory Visit: Payer: Medicare Other | Admitting: Psychology

## 2022-03-05 ENCOUNTER — Ambulatory Visit (INDEPENDENT_AMBULATORY_CARE_PROVIDER_SITE_OTHER): Payer: Medicare Other | Admitting: Physical Therapy

## 2022-03-05 ENCOUNTER — Encounter: Payer: Self-pay | Admitting: Physical Therapy

## 2022-03-05 DIAGNOSIS — M25551 Pain in right hip: Secondary | ICD-10-CM

## 2022-03-05 DIAGNOSIS — M6281 Muscle weakness (generalized): Secondary | ICD-10-CM

## 2022-03-05 DIAGNOSIS — R2689 Other abnormalities of gait and mobility: Secondary | ICD-10-CM

## 2022-03-05 NOTE — Progress Notes (Unsigned)
Cardiology Office Note   Date:  03/06/2022   ID:  Amanda Wilkins, DOB 1946-08-02, MRN [id number]  PCP:  [name], MD  Cardiologist:   [name], MD    F/u of PAF     History of Present Illness: Amanda Wilkins is a 76 y.o. female with a history of chest pain, near syncope  and PAF  Echo in 2016 showed normal LVEF Carotid USN showed mild plaquing   Holter showed intermitt afib/SVT (first Dx 2010)  Recomm flecanide; pt declined Rx Myovue showed inferolateral and inferior defect consistent with ischemia  CT calcium score 2020 was 0 PFTs done for dyspnea were normal Echo in 2021 normal LVEF /RVEF CPX done in Aug 2021.  Limitation felt due to body weight  Pt enrolled into [employer] and felt much better with no signif dyspnea  At the end of July 2022  She had 4-5 glasses of wine one night   Develop tachypalpitations   Took an extra atenolol and resolved     Went to ED to get checked On arrival was in SR     BP 181/87    She was sent home  I saw her in clinic after that  Since then she had recurrent palpitations  Atenolol increased  She cut out alcohol   In early November she was admitted for afib wtth RVR  Started on Flecanide.  Set up for outpt cardioversion She had this done on 08/28/21   Converted to SR but reverted back to afib before d/c from the endo unit.    Flecanide was increased to 75 bid    Plan to check trough level at 2 wks then repeat cardioversion   She was admitted on 11/18 with abdominal pain   Found to have cholecystitits    Also found to be in SR    Due to recent conversion to SR (from flecanide) she was treate with ABX only and surgery deferred fro 1 month    Since d/c she hs done OK   Denies CP   Breathing is fair   Denies palpitations   Denies signif dizziness   Abdomen feels OK     Does have some R hip pain.   I saw the pt in Dec   She was seen by D Dunn in April 2023 asa a preop evaluatoin  The pt says since November admit for GI she has been  burping a lot   No severe pains     Breathing is OK   No significant palpitations   No CP     Patient is due for Knee surgery Aug 8      Current Meds  Medication Sig   acetaminophen (TYLENOL) 500 MG tablet Take 1,000 mg by mouth daily as needed (pain).   amLODipine (NORVASC) 2.5 MG tablet Take 0.5 tablets (1.25 mg total) by mouth daily.   Biotin 1000 MCG tablet Take 1,000 mcg by mouth in the morning.   cholecalciferol (VITAMIN D3) 25 MCG (1000 UT) tablet Take 1,000 Units by mouth daily.   ELIQUIS 5 MG TABS tablet Take 5 mg by mouth 2 (two) times daily.   EPINEPHrine 0.3 mg/0.3 mL IJ SOAJ injection Inject 0.3 mg into the muscle as needed for anaphylaxis. As needed for life-threatening allergic reactions   flecainide (TAMBOCOR) 50 MG tablet Take 1.5 tablets (75 mg total) by mouth 2 (two) times daily.   gabapentin (NEURONTIN) 300 MG capsule Take 1 capsule (300 mg total) by mouth  3 (three) times daily.   hydroxychloroquine (PLAQUENIL) 200 MG tablet Take 400 mg by mouth every morning.   metoprolol tartrate (LOPRESSOR) 25 MG tablet Take 25 mg by mouth 2 (two) times daily.   Multiple Vitamin (MULTIVITAMIN WITH MINERALS) TABS tablet Take 1 tablet by mouth daily. Centrum for Women 50+   Probiotic Product (ALIGN) 4 MG CAPS Take 4 mg by mouth daily.   rosuvastatin (CRESTOR) 5 MG tablet Take 1 tablet (5 mg total) by mouth daily.   sertraline (ZOLOFT) 50 MG tablet TAKE 1 TABLET BY MOUTH DAILY     Allergies:   Epinephrine base, Aspirin, Benadryl [diphenhydramine], Covid-19 ad26 vaccine(janssen), Fish allergy, Hylan g-f 20, Penicillins, Tape, and Wound dressing adhesive   Past Medical History:  Diagnosis Date   Acute bronchitis 09/11/2016   11/17 refractory   Acute cystitis without hematuria 05/17/2015   Acute kidney injury [facility])    Labs today   Acute right ankle pain 09/09/2018   Anticoagulant long-term use    Xarelto   Axillary pain    Bronchitis    Cellulitis    Cholecystitis     Chronic interstitial nephritis    CONJUNCTIVITIS, ACUTE 10/18/2010   Qualifier: Diagnosis of  By: Plotnikov MD, Evie Lacks    D-dimer, elevated    Depression    Eye inflammation    GERD (gastroesophageal reflux disease)    History of interstitial nephritis 2016   chronic   History of kidney stones    has a small one found on xray   History of nuclear stress test 12/16/2014   Intermediate risk nuclear study w/ medium size moderate severity reversible defect in the basal and mid inferolateral and inferior wall (per dr cardiology note , dr [name] did not think this was consistent with ischemia)/  normal LV function and wall motion, ef 75%   History of septic shock 01/22/2015   in setting Group A Strep Cellulits erysipelas/chest wall induration with streptoccocus basterium-- Severe sepsis, DIC, Acute respiratory failure with pulmonary edema, Acute Kidney failure with chronic interstitial nephritis   Hypertension    Hyponatremia    Migraines    on Zoloft for migraines   Mild carotid artery disease ([place])    per duplex 08-30-2017 bilateral ICA 1-39%   OA (osteoarthritis) rheumotologist-  dr [name]   both knees,  shoulders, ankles   OSA on CPAP     moderate obstructive sleep apnea with an AHI of 23.4/h and oxygen desaturations as low as 84%.  Now on CPAP at 12 cm H2O.   PAF (paroxysmal atrial fibrillation) [facility]) 2009   cardiologist-- dr [name]   Paroxysmal atrial flutter [facility])    a. dx 11/2017.   PSVT (paroxysmal supraventricular tachycardia) ([place])    Wears contact lenses     Past Surgical History:  Procedure Laterality Date   BREAST BIOPSY Right 05/15/2017   [place]   CARDIOVERSION N/A 08/28/2021   Procedure: CARDIOVERSION;  Surgeon: Fay Records, MD;  Location: [facility] ENDOSCOPY;  Service: Cardiovascular;  Laterality: N/A;   [place] N/A 11/16/2021   Procedure: LAPAROSCOPIC CHOLECYSTECTOMY;  Surgeon: Ralene Ok, MD;  Location: [place];   Service: General;  Laterality: N/A;   ESOPHAGOGASTRODUODENOSCOPY N/A 02/08/2015   Procedure: ESOPHAGOGASTRODUODENOSCOPY (EGD);  Surgeon: [name], MD;  Location: [place];  Service: Endoscopy;  Laterality: N/A;   KNEE ARTHROSCOPY W/ MENISCAL REPAIR Left 08/2014    '@[facility]'$    SHOULDER SURGERY Right 04/04/2016  TOTAL KNEE ARTHROPLASTY Right 09/01/2018   Procedure: RIGHT TOTAL KNEE ARTHROPLASTY;  Surgeon: [name], MD;  Location[facility];  Service: Orthopedics;  Laterality: Right;     Social History:  The patient  reports that she has never smoked. She has never used smokeless tobacco. She reports current alcohol use. She reports that she does not use drugs.   Family History:  The patient's family history includes Allergic rhinitis in her sister; Breast cancer (age of onset: 49) in her sister; Lymphoma in her mother; Pancreatic cancer in her brother; Prostate cancer in her father; Urticaria in her sister.    ROS:  Please see the history of present illness. All other systems are reviewed and  Negative to the above problem except as noted.    PHYSICAL EXAM: VS:  BP 122/68   Pulse 62   Ht '5\' 5"'$  (1.651 m)   Wt 241 lb 9.6 oz (109.6 kg)   SpO2 96%   BMI 40.20 kg/m   GEN: Morbidly obese 76 yo  in no acute distress  HEENT: normal  Neck: JVP is normal  No bruits Cardiac: RRR; no murmurs,, NO LE  edema  Respiratory:  clear to auscultation bilaterally,  GI: soft, nontender, nondistended, + BS  No hepatomegaly  MS: no deformity Moving all extremities   Skin: warm and dry Neuro:  Grossly intact   Psych: euthymic mood, full affect   EKG:  EKG is  Not ordered today.   Lipid Panel    Component Value Date/Time   CHOL 175 11/04/2020 1224   TRIG 69 11/04/2020 1224   HDL 102 11/04/2020 1224   CHOLHDL 1.7 11/04/2020 1224   CHOLHDL 2 05/28/2018 0837   VLDL 13.8 05/28/2018 0837   LDLCALC 60 11/04/2020 1224      Wt Readings from Last 3 Encounters:  03/06/22 241 lb 9.6 oz (109.6  kg)  01/22/22 234 lb (106.1 kg)  01/16/22 232 lb (105.2 kg)    Echo:   1. Left ventricular ejection fraction, by estimation, is 55 to 60%. The left ventricle has normal function. The left ventricle has no regional wall motion abnormalities. There is mild concentric left ventricular hypertrophy. Left ventricular diastolic parameters were normal. 2. Right ventricular systolic function is normal. The right ventricular size is normal. There is moderately elevated pulmonary artery systolic pressure. 3. The mitral valve is normal in structure and function. Mild mitral valve regurgitation. No evidence of mitral stenosis. 4. The aortic valve is tricuspid. Aortic valve regurgitation is not visualized. No aortic stenosis is present. 5. The inferior vena cava is normal in size with <50% respiratory variability, suggesting right atrial pressure of 8 mmHg. Comparison(s): 01/25/15 EF 55-60%. PA pressure 55mHg.  ASSESSMENT AND PLAN  1 PAF  Patinet is maintaining SR clinically   Keep on current regimen   2   Preop assessment  PT is low risk for planned ortho procedure  OK to proceed   Follow on tele  Continue meds    3  HTN   BP is controlled Keep on same meds    4  Hx diastolic dysfunction. Volume appears OK      5  Hx CP    Denies CP   Ca score is 0   Pt has mild plaquing of aorta  Keep on Crestor    6 CV dz Mild plaquing  Keep on statin     7 HL  Keep on Crestor given atherosclerosis of aorta   LDL 60  HDL 102  Trig 69    Follow up next winter       Current medicines are reviewed at length with the patient today.  The patient does not have concerns regarding medicines.  Signed, [name], MD  03/06/2022 11:28 AM    [address], [place], [place]  [address] Phone: [phone]; Fax: [phone]
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
## 2022-03-06 ENCOUNTER — Ambulatory Visit (INDEPENDENT_AMBULATORY_CARE_PROVIDER_SITE_OTHER): Payer: Medicare Other | Admitting: Obstetrics & Gynecology

## 2022-03-06 ENCOUNTER — Ambulatory Visit (INDEPENDENT_AMBULATORY_CARE_PROVIDER_SITE_OTHER): Payer: Medicare Other | Admitting: Internal Medicine

## 2022-03-06 ENCOUNTER — Encounter: Payer: Self-pay | Admitting: Obstetrics & Gynecology

## 2022-03-06 ENCOUNTER — Encounter: Payer: Self-pay | Admitting: Internal Medicine

## 2022-03-06 VITALS — BP 122/68 | HR 62 | Ht 65.0 in | Wt 241.6 lb

## 2022-03-06 VITALS — BP 114/70 | Temp 98.8°F

## 2022-03-06 DIAGNOSIS — R350 Frequency of micturition: Secondary | ICD-10-CM

## 2022-03-06 DIAGNOSIS — R109 Unspecified abdominal pain: Secondary | ICD-10-CM | POA: Diagnosis not present

## 2022-03-06 DIAGNOSIS — I700 Atherosclerosis of aorta: Secondary | ICD-10-CM

## 2022-03-06 DIAGNOSIS — Z79899 Other long term (current) drug therapy: Secondary | ICD-10-CM | POA: Diagnosis not present

## 2022-03-06 LAB — URINALYSIS, COMPLETE W/RFL CULTURE
Bacteria, UA: NONE SEEN /HPF
Bilirubin Urine: NEGATIVE
Glucose, UA: NEGATIVE
Hgb urine dipstick: NEGATIVE
Ketones, ur: NEGATIVE
Leukocyte Esterase: NEGATIVE
Nitrites, Initial: NEGATIVE
Protein, ur: NEGATIVE
RBC / HPF: NONE SEEN /HPF (ref 0–2)
Specific Gravity, Urine: 1.015 (ref 1.001–1.035)
WBC, UA: NONE SEEN /HPF (ref 0–5)
pH: 6.5 (ref 5.0–8.0)

## 2022-03-06 LAB — NO CULTURE INDICATED

## 2022-03-06 LAB — LIPID PANEL
Chol/HDL Ratio: 1.7 ratio (ref 0.0–4.4)
Cholesterol, Total: 168 mg/dL (ref 100–199)
HDL: 98 mg/dL (ref >39)
LDL Chol Calc (NIH): 59 mg/dL (ref 0–99)
Triglycerides: 52 mg/dL (ref 0–149)
VLDL Cholesterol Cal: 11 mg/dL (ref 5–40)

## 2022-03-06 MED ORDER — PANTOPRAZOLE SODIUM 40 MG PO TBEC: 40.0000 mg | DELAYED_RELEASE_TABLET | Freq: Every day | ORAL | 0 refills | Status: DC

## 2022-03-06 NOTE — Patient Instructions (Signed)
Medication Instructions:  Pantoprazole 40 mg daily  *If you need a refill on your cardiac medications before your next appointment, please call your pharmacy*   Lab Work: LIPID today  If you have labs (blood work) drawn today and your tests are completely normal, you will receive your results only by: [place] (if you have MyChart) OR A paper copy in the mail If you have any lab test that is abnormal or we need to change your treatment, we will call you to review the results.   Testing/Procedures:    Follow-Up: At [facility], you and your health needs are our priority.  As part of our continuing mission to provide you with exceptional heart care, we have created designated Provider Care Teams.  These Care Teams include your primary Cardiologist (physician) and Advanced Practice Providers (APPs -  Physician Assistants and Nurse Practitioners) who all work together to provide you with the care you need, when you need it.  We recommend signing up for the patient portal called "MyChart".  Sign up information is provided on this After Visit Summary.  MyChart is used to connect with patients for Virtual Visits (Telemedicine).  Patients are able to view lab/test results, encounter notes, upcoming appointments, etc.  Non-urgent messages can be sent to your provider as well.   To learn more about what you can do with MyChart, go to [email or web address].    Your next appointment:   5 month(s)  The format for your next appointment:   In Person  Provider:   [name], MD     Other Instructions   Important Information About Sugar

## 2022-03-06 NOTE — Progress Notes (Signed)
    Amanda Wilkins June 03, 1946 [id number]        76 y.o.  G2P2L2 Married  RP: Lower abdominal tenderness with urinary frequency  HPI: Lower abdominal tenderness with urinary frequency.  No burning with urination, no blood in urine.  No fever.  No pain with IC.  No vaginal discharge.  No PMB.  BMs normal.  Doing exercises in bed for her Rt hip, possibly causing muscle tenderness at the left lower abdominal wall.   OB History  Gravida Para Term Preterm AB Living  '2 2 2     2  '$ SAB IAB Ectopic Multiple Live Births          2    # Outcome Date GA Lbr Len/2nd Weight Sex Delivery Anes PTL Lv  2 Term     M Vag-Spont  N LIV  1 Term     M CS-Unspec  N LIV    Past medical history,surgical history, problem list, medications, allergies, family history and social history were all reviewed and documented in the EPIC chart.   Directed ROS with pertinent positives and negatives documented in the history of present illness/assessment and plan.  Exam:  Vitals:   03/06/22 1439  BP: 114/70  Temp: 98.8 F (37.1 C)  TempSrc: Oral   General appearance:  Normal  CVAT Negative bilaterally  Abdomen: Soft, not distended.  Mildly tender at the left Rectus Abdominal muscle.  Gynecologic exam: Deferred  U/A:  Dark yellow clear, Nit Neg, Protein Neg, WBC Neg, RBC Neg, Bacteria Neg.  Hyaline 0-1.   Assessment/Plan:  76 y.o. G2P2002   1. Urinary frequency Lower abdominal tenderness with urinary frequency.  No burning with urination, no blood in urine.  No fever.  No pain with IC.  No vaginal discharge.  No PMB.  BMs normal.  U/A Negative.  Patient reassured. - Urinalysis,Complete w/RFL Culture  2. Abdominal wall pain Doing exercises in bed for her Rt hip, possibly causing muscle tenderness at the left lower abdominal wall.  Will rest, apply warmth and take Tylenol as needed.  Other orders - REFLEXIVE URINE CULTURE   Princess Bruins MD, 2:47 PM 03/06/2022

## 2022-03-07 ENCOUNTER — Encounter: Payer: Self-pay | Admitting: Internal Medicine

## 2022-03-07 ENCOUNTER — Telehealth (INDEPENDENT_AMBULATORY_CARE_PROVIDER_SITE_OTHER): Payer: Medicare Other | Admitting: Internal Medicine

## 2022-03-07 DIAGNOSIS — M25562 Pain in left knee: Secondary | ICD-10-CM | POA: Diagnosis not present

## 2022-03-07 DIAGNOSIS — G8929 Other chronic pain: Secondary | ICD-10-CM

## 2022-03-07 DIAGNOSIS — B0229 Other postherpetic nervous system involvement: Secondary | ICD-10-CM

## 2022-03-07 NOTE — Assessment & Plan Note (Signed)
TKR is planned for 05/22/22 Norco prn  Potential benefits of a long term opioids use as well as potential risks (i.e. addiction risk, apnea etc) and complications (i.e. Somnolence, constipation and others) were explained to the patient and were aknowledged.

## 2022-03-07 NOTE — Assessment & Plan Note (Signed)
Re-start Zostrix Cont Gabapentin Norco prn  Potential benefits of a long term opioids use as well as potential risks (i.e. addiction risk, apnea etc) and complications (i.e. Somnolence, constipation and others) were explained to the patient and were aknowledged.

## 2022-03-07 NOTE — Progress Notes (Signed)
Virtual Visit via Video Note  I connected with Amanda Wilkins on 03/07/22 at  3:40 PM EDT by a video enabled telemedicine application and verified that I am speaking with the correct person using two identifiers.   I discussed the limitations of evaluation and management by telemedicine and the availability of in person appointments. The patient expressed understanding and agreed to proceed.  I was located at our [facility] office. The patient was at home. There was no one else present in the visit.  No chief complaint on file.    History of Present Illness: F/u on post-herpetic R arm discomfort, itching F/u on knee OA w/pain  Review of Systems  Constitutional:  Negative for fever.  Cardiovascular:  Negative for leg swelling.  Musculoskeletal:  Negative for back pain and neck pain.    Observations/Objective: The patient appears to be in no acute distress  Assessment and Plan:  Problem List Items Addressed This Visit     Left knee pain    TKR is planned for 05/22/22 Norco prn  Potential benefits of a long term opioids use as well as potential risks (i.e. addiction risk, apnea etc) and complications (i.e. Somnolence, constipation and others) were explained to the patient and were aknowledged.        Postherpetic neuralgia    Re-start Zostrix Cont Gabapentin Norco prn  Potential benefits of a long term opioids use as well as potential risks (i.e. addiction risk, apnea etc) and complications (i.e. Somnolence, constipation and others) were explained to the patient and were aknowledged.          No orders of the defined types were placed in this encounter.    Follow Up Instructions:    I discussed the assessment and treatment plan with the patient. The patient was provided an opportunity to ask questions and all were answered. The patient agreed with the plan and demonstrated an understanding of the instructions.   The patient was advised to call back or seek  an in-person evaluation if the symptoms worsen or if the condition fails to improve as anticipated.  I provided face-to-face time during this encounter. We were at different locations.   [name], MD

## 2022-03-08 ENCOUNTER — Ambulatory Visit: Payer: Medicare Other | Admitting: Psychology

## 2022-03-14 ENCOUNTER — Ambulatory Visit (INDEPENDENT_AMBULATORY_CARE_PROVIDER_SITE_OTHER): Payer: Medicare Other | Admitting: Podiatry

## 2022-03-14 ENCOUNTER — Encounter: Payer: Self-pay | Admitting: Podiatry

## 2022-03-14 DIAGNOSIS — Z79899 Other long term (current) drug therapy: Secondary | ICD-10-CM | POA: Diagnosis not present

## 2022-03-14 DIAGNOSIS — L600 Ingrowing nail: Secondary | ICD-10-CM

## 2022-03-14 NOTE — Progress Notes (Signed)
Subjective:   Patient ID: Amanda Wilkins, female   DOB: 76 y.o.   MRN: [id number]   HPI Patient presents stating that her right hallux nail is healing well and the left big toenail has become ingrown and sore   ROS      Objective:  Physical Exam  Neurovascular status intact with ingrown toenail right hallux healed well and left hallux medial border incurvated and sore with pressure     Assessment:  Ingrown toenail deformity left hallux medial border with pain along with well-healed right nailbed     Plan:  H&P reviewed condition recommended correction of left explained procedure risk and patient wants procedure.  I allowed her to read consent form she understands signs understanding risk and today I infiltrated the left hallux 60 mg like Marcaine mixture sterile prep done using sterile instrumentation remove the medial border exposed matrix applied phenol 3 applications 30 seconds followed by alcohol by sterile dressing gave instructions on soaks reappoint to recheck

## 2022-03-14 NOTE — Patient Instructions (Signed)

## 2022-03-15 ENCOUNTER — Ambulatory Visit: Payer: Medicare Other | Admitting: Psychology

## 2022-03-19 ENCOUNTER — Telehealth: Payer: Self-pay | Admitting: Internal Medicine

## 2022-03-19 MED ORDER — METOPROLOL TARTRATE 25 MG PO TABS: 25.0000 mg | ORAL_TABLET | Freq: Two times a day (BID) | ORAL | 3 refills | Status: DC

## 2022-03-19 NOTE — Telephone Encounter (Signed)
*  STAT* If patient is at the pharmacy, call can be transferred to refill team.   1. Which medications need to be refilled? (please list name of each medication and dose if known) metoprolol tartrate (LOPRESSOR) 25 MG tablet  2. Which pharmacy/location (including street and city if local pharmacy) is medication to be sent to?  [facility] [place], [place] AT [address]  3. Do they need a 30 day or 90 day supply? [place]

## 2022-03-19 NOTE — Telephone Encounter (Signed)
Pt's medication was sent to pt's pharmacy as requested. Confirmation received.  °

## 2022-03-21 ENCOUNTER — Encounter: Payer: Self-pay | Admitting: Physical Therapy

## 2022-03-21 ENCOUNTER — Ambulatory Visit (INDEPENDENT_AMBULATORY_CARE_PROVIDER_SITE_OTHER): Payer: Medicare Other | Admitting: Physical Therapy

## 2022-03-21 DIAGNOSIS — R2689 Other abnormalities of gait and mobility: Secondary | ICD-10-CM | POA: Diagnosis not present

## 2022-03-21 DIAGNOSIS — M25551 Pain in right hip: Secondary | ICD-10-CM

## 2022-03-21 DIAGNOSIS — M6281 Muscle weakness (generalized): Secondary | ICD-10-CM
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
## 2022-03-22 ENCOUNTER — Ambulatory Visit: Payer: Medicare Other | Admitting: Psychology

## 2022-03-27 ENCOUNTER — Encounter: Payer: Self-pay | Admitting: Internal Medicine

## 2022-03-28 ENCOUNTER — Other Ambulatory Visit: Payer: Self-pay | Admitting: Internal Medicine

## 2022-03-29 ENCOUNTER — Encounter: Payer: Self-pay | Admitting: Allergy & Immunology

## 2022-03-29 ENCOUNTER — Ambulatory Visit (INDEPENDENT_AMBULATORY_CARE_PROVIDER_SITE_OTHER): Payer: Medicare Other | Admitting: Allergy & Immunology

## 2022-03-29 ENCOUNTER — Ambulatory Visit: Payer: Medicare Other | Admitting: Psychology

## 2022-03-29 VITALS — BP 130/80 | HR 72 | Temp 97.7°F | Ht 65.0 in | Wt 235.4 lb

## 2022-03-29 DIAGNOSIS — T7800XD Anaphylactic reaction due to unspecified food, subsequent encounter: Secondary | ICD-10-CM

## 2022-03-29 DIAGNOSIS — L508 Other urticaria: Secondary | ICD-10-CM | POA: Diagnosis not present

## 2022-03-29 NOTE — Patient Instructions (Addendum)
1. Anaphylactic shock due to food - Testing was negative to seafood. - Copy of testing results results provided. - This combined with the negative blood work makes me think that you will tolerate the seafood without a problem. - EpiPen provided in case this is needed.  2. Return if symptoms worsen or fail to improve.    Please inform us of any Emergency Department visits, hospitalizations, or changes in symptoms. Call us before going to the ED for breathing or allergy symptoms since we might be able to fit you in for a sick visit. Feel free to contact us anytime with any questions, problems, or concerns.  It was a pleasure to meet you today!  Websites that have reliable patient information: 1. American Academy of Asthma, Allergy, and Immunology: www.aaaai.org 2. Food Allergy Research and Education (FARE): foodallergy.org 3. Mothers of Asthmatics: http://www.asthmacommunitynetwork.org 4. American College of Allergy, Asthma, and Immunology: www.acaai.org   COVID-19 Vaccine Information can be found at: ShippingScam.co.uk For questions related to vaccine distribution or appointments, please email [email or web address] or call [phone].   We realize that you might be concerned about having an allergic reaction to the COVID19 vaccines. To help with that concern, WE ARE OFFERING THE COVID19 VACCINES IN OUR OFFICE! Ask the front desk for dates!     "Like" [place] on Facebook and Instagram for our latest updates!      A healthy democracy works best when [employer] participate! Make sure you are registered to vote! If you have moved or changed any of your contact information, you will need to get this updated before voting!  In some cases, you MAY be able to register to vote online: [email or web address]
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
## 2022-03-31 DIAGNOSIS — S90851A Superficial foreign body, right foot, initial encounter: Secondary | ICD-10-CM | POA: Diagnosis not present

## 2022-03-31 DIAGNOSIS — X58XXXA Exposure to other specified factors, initial encounter: Secondary | ICD-10-CM | POA: Diagnosis not present

## 2022-04-02 ENCOUNTER — Other Ambulatory Visit: Payer: Self-pay | Admitting: Allergy & Immunology

## 2022-04-02 ENCOUNTER — Encounter: Payer: Self-pay | Admitting: Allergy & Immunology

## 2022-04-02 ENCOUNTER — Encounter: Payer: Medicare Other | Admitting: Physical Therapy

## 2022-04-02 MED ORDER — EPINEPHRINE 0.3 MG/0.3ML IJ SOAJ: 0.3000 mg | INTRAMUSCULAR | 1 refills | Status: DC | PRN

## 2022-04-02 NOTE — Telephone Encounter (Signed)
Can I sent this to the pharmacy? It is on her allergy list.

## 2022-04-02 NOTE — Telephone Encounter (Signed)
Patient called and stated that she was here last week to see Dr Ernst Bowler and that he put in a prescription for an epi pen for her. She called the pharmacy but they did not have the prescription for her. Patients pharmacy is [facility] on [address]. Patients call back number is [phone]

## 2022-04-02 NOTE — Telephone Encounter (Signed)
She needs an EpiPen. I have no idea about the allergy, but the benefits outweigh the risks.   Salvatore Marvel, MD [facility] [place] [facility] [place]

## 2022-04-03 NOTE — Telephone Encounter (Signed)
Patient have been notified.  

## 2022-04-05 ENCOUNTER — Ambulatory Visit: Payer: Medicare Other | Admitting: Psychology

## 2022-04-12 ENCOUNTER — Ambulatory Visit: Payer: Medicare Other | Admitting: Psychology

## 2022-04-16 ENCOUNTER — Encounter: Payer: Medicare Other | Admitting: Physical Therapy

## 2022-04-16 ENCOUNTER — Telehealth: Payer: Self-pay | Admitting: Internal Medicine

## 2022-04-16 NOTE — Telephone Encounter (Signed)
Patient calls this morning to let us know she is back in A-fib upon wakening. She had cardioversion 11/22 and has not missed any doses of Eliquis or Flecainide ('75mg'$  twice daily) and has maintained SR until now. Patient has known history of PAF. States HR has not been higher than 110bpm as of time of call. Denies symptoms of dizziness or SOB. Pt calling for advice and requesting something for breakthrough episodes such as this. Pt has metoprolol tartrate '25mg'$  that she takes twice daily. Primary MD out of office this week. Will route to A-fib clinic for advisement/recommendation.

## 2022-04-16 NOTE — Telephone Encounter (Signed)
Patient converted back into NSR after her morning medications. Per Adline Peals PA can use an extra tablet of metoprolol for breakthrough afib. Pt verbalized understanding.

## 2022-04-16 NOTE — Telephone Encounter (Signed)
Patient c/o Palpitations:  High priority if patient c/o lightheadedness, shortness of breath, or chest pain  How long have you had palpitations/irregular HR/ Afib? Are you having the symptoms now? Patient states she work up with it overnight  Are you currently experiencing lightheadedness, SOB or CP? No  Do you have a history of afib (atrial fibrillation) or irregular heart rhythm? Yes  Have you checked your BP or HR? (document readings if available):  HR 60-110  Are you experiencing any other symptoms?   Patient is concern she is in afib.

## 2022-04-19 ENCOUNTER — Ambulatory Visit: Payer: Medicare Other | Admitting: Psychology

## 2022-04-20 ENCOUNTER — Telehealth: Payer: Self-pay | Admitting: Allergy & Immunology

## 2022-04-20 NOTE — Telephone Encounter (Signed)
Amanda Wilkins just wanted to let Dr. Ernst Bowler know that she had no reactions after the no crab dinner.

## 2022-04-24 NOTE — Telephone Encounter (Signed)
THAT is fantastic! I hope she had a great time catching up with her [place] family!   She is a [employer]. I wish she had more wrong with her so we could see more of her! :-)  Salvatore Marvel, MD [facility] [place] [facility]

## 2022-04-26 ENCOUNTER — Ambulatory Visit: Payer: Medicare Other | Admitting: Psychology

## 2022-04-30 ENCOUNTER — Telehealth: Payer: Self-pay

## 2022-04-30 NOTE — Telephone Encounter (Signed)
FYI - patient called and has decided to switch surgery back to right THA instead of left TKA.  Thanks!

## 2022-05-01 ENCOUNTER — Encounter: Payer: Self-pay | Admitting: Internal Medicine

## 2022-05-01 ENCOUNTER — Telehealth: Payer: Self-pay

## 2022-05-01 ENCOUNTER — Ambulatory Visit (INDEPENDENT_AMBULATORY_CARE_PROVIDER_SITE_OTHER): Payer: Medicare Other | Admitting: Internal Medicine

## 2022-05-01 ENCOUNTER — Telehealth: Payer: Self-pay | Admitting: Internal Medicine

## 2022-05-01 DIAGNOSIS — B009 Herpesviral infection, unspecified: Secondary | ICD-10-CM | POA: Diagnosis not present

## 2022-05-01 MED ORDER — VALACYCLOVIR HCL 1 G PO TABS: ORAL_TABLET | ORAL | 0 refills | Status: DC

## 2022-05-01 NOTE — Telephone Encounter (Signed)
Pt has multiple ulcers in mouth

## 2022-05-01 NOTE — Telephone Encounter (Signed)
Check [place] registry last filled 01/26/2022.[name] KitchenJohny Wilkins

## 2022-05-01 NOTE — Telephone Encounter (Signed)
During visit patient asked about refill to her hydrocodone PCP prescribes for hip pain. She is getting hip replacement in September. Will forward to PCP to address. Thanks

## 2022-05-01 NOTE — Patient Instructions (Signed)
I will let Dr. Alain Marion know that you need a refill of the hydrocodone so he can refill that.  We have sent in valtrex to take 1 pill twice a day for 1 week then 1 pill daily to help prevent this.

## 2022-05-01 NOTE — Progress Notes (Signed)
   Subjective:   Patient ID: Amanda Wilkins, female    DOB: 1946/09/18, 76 y.o.   MRN: [id number]  HPI The patient is a 76 YO female coming in for herpes simplex outbreak on mouth and ulcers in the mouth  Review of Systems  Constitutional: Negative.   HENT:  Positive for sore throat.   Eyes: Negative.   Respiratory:  Negative for cough, chest tightness and shortness of breath.   Cardiovascular:  Negative for chest pain, palpitations and leg swelling.  Gastrointestinal:  Negative for abdominal distention, abdominal pain, constipation, diarrhea, nausea and vomiting.  Musculoskeletal: Negative.   Skin: Negative.   Neurological: Negative.   Psychiatric/Behavioral: Negative.      Objective:  Physical Exam Constitutional:      Appearance: She is well-developed.  HENT:     Head: Normocephalic and atraumatic.     Comments: Herpes simplex lesion left lip, ulcer on tongue first 1/3 of the left side, no dental abscess or infection noted Cardiovascular:     Rate and Rhythm: Normal rate and regular rhythm.  Pulmonary:     Effort: Pulmonary effort is normal. No respiratory distress.     Breath sounds: Normal breath sounds. No wheezing or rales.  Abdominal:     General: Bowel sounds are normal. There is no distension.     Palpations: Abdomen is soft.     Tenderness: There is no abdominal tenderness. There is no rebound.  Musculoskeletal:     Cervical back: Normal range of motion.  Skin:    General: Skin is warm and dry.  Neurological:     Mental Status: She is alert and oriented to person, place, and time.     Coordination: Coordination normal.     Vitals:   05/01/22 1526  BP: 124/78  Pulse: 77  Resp: 18  SpO2: 98%  Weight: 237 lb 12.8 oz (107.9 kg)  Height: '5\' 5"'$  (1.651 m)    Assessment & Plan:

## 2022-05-02 ENCOUNTER — Ambulatory Visit: Payer: Medicare Other | Admitting: Family Medicine

## 2022-05-02 ENCOUNTER — Ambulatory Visit: Payer: Medicare Other | Admitting: Orthopaedic Surgery

## 2022-05-02 MED ORDER — HYDROCODONE-ACETAMINOPHEN 10-325 MG PO TABS: 1.0000 | ORAL_TABLET | Freq: Four times a day (QID) | ORAL | 0 refills | Status: AC | PRN

## 2022-05-02 NOTE — Telephone Encounter (Signed)
Notified pt w/MD response../lm,b

## 2022-05-02 NOTE — Telephone Encounter (Signed)
Okay.  Thanks.

## 2022-05-03 ENCOUNTER — Encounter: Payer: Self-pay | Admitting: Internal Medicine

## 2022-05-03 ENCOUNTER — Ambulatory Visit: Payer: Medicare Other | Admitting: Psychology

## 2022-05-03 NOTE — Assessment & Plan Note (Signed)
Rx valtrex 1000 mg BID for 1 week and given enough so she can keep on hand and start with onset of outbreak for treatment. She does not wish to do ongoing treatment for prevention.

## 2022-05-08 ENCOUNTER — Telehealth: Payer: Self-pay | Admitting: Internal Medicine

## 2022-05-08 ENCOUNTER — Other Ambulatory Visit: Payer: Self-pay

## 2022-05-08 NOTE — Telephone Encounter (Signed)
Pt c/o BP issue: STAT if pt c/o blurred vision, one-sided weakness or slurred speech  1. What are your last 5 BP readings?  153/86 (this morning) 159/107 (this afternoon) 2. Are you having any other symptoms (ex. Dizziness, headache, blurred vision, passed out)?   3. What is your BP issue?   Patient is following up to report BP readings for Dr. [name]' nurse. She would like to know if she needs to modify her medication.

## 2022-05-08 NOTE — Telephone Encounter (Signed)
Patient called back after checking her BP again.  Reading was 155/93.  She took metoprolol at this time.  She had gotten off schedule due to taking a dose at 2 AM and not taking an AM dose today.  She is feeling fine.  Has had company for a month and people have been preparing meals and bringing to her.  Meals have been high in salt.  She will try and decrease salt intake.  I told patient if BP is elevated again later this evening she could take one additional dose of metoprolol if needed.

## 2022-05-08 NOTE — Telephone Encounter (Signed)
STAT if HR is under 50 or over 120 (normal HR is 60-100 beats per minute)  What is your heart rate? 72  Do you have a log of your heart rate readings (document readings)?  156 - stayed at that range for about an hour.    Do you have any other symptoms? Pt states she got really shaky when he hr when up. Pt says she had a little SOB but denies any other symptoms.

## 2022-05-08 NOTE — Telephone Encounter (Signed)
I spoke with patient. Prior to today she has not been checking her blood pressure. Started checking due to elevated heart rate earlier today.  Has checked 4 times today.  Most recent reading was 144/87.  Heart rate 72.   She will continue to check daily

## 2022-05-08 NOTE — Telephone Encounter (Signed)
Pt called to report that she woke up at 2 am and her HR shot up to 156 for about an hour...her only symptoms was feeling "shaky"... she took a metoprolol and started to go to the ED but by time she got there with her husband her HR came down and she was feeling better.   This morning she is feeling well... HR 72..   I have asked her to continue to monitor and I will forward to Dr [name] for her review per her request.

## 2022-05-09 ENCOUNTER — Encounter: Payer: Self-pay | Admitting: Internal Medicine

## 2022-05-09 NOTE — Telephone Encounter (Signed)
Spoke with the patient who reports that she took her metoprolol, amlodipine, and flecainide and Eliquis this morning. A little over an hour later she took her BP and it was 157/102. Patient reports having a headache. She retook her BP on the phone with me and reports it was 147/89. I have encouraged the patient to keep a log of her daily blood pressures - a couple hours after taking her medications so that we can see how BP is running on a daily basis and to call back with those readings. She will call back sooner if BP is consistently elevated.  Will await further recommendations from Dr. [name].

## 2022-05-09 NOTE — Telephone Encounter (Signed)
Error

## 2022-05-09 NOTE — Telephone Encounter (Signed)
Pt is on very low dose of amlodipine   Se could take an extra 1/2 or 1 of this

## 2022-05-09 NOTE — Telephone Encounter (Signed)
Pt is calling back to report today's bp #'s   157/102  62 HR  Pt is requesting a call back from RN to discuss this.

## 2022-05-09 NOTE — Telephone Encounter (Signed)
Called and spoke with patient. She understands to increase the Amlodipine. She states that her diastolic number is consistently above 37mhg and usually in triple digits, every morning. Educated patient that the fact that it comes down after taking her medications is a good and expected outcome (the numbers she's looking at are first thing in the AM before any medications). She will increase to 1 tablet of the 2.'5mg'$  daily and [place] with her readings on Friday.

## 2022-05-10 ENCOUNTER — Ambulatory Visit: Payer: Medicare Other | Admitting: Psychology

## 2022-05-16 ENCOUNTER — Other Ambulatory Visit: Payer: Self-pay | Admitting: Internal Medicine

## 2022-05-16 DIAGNOSIS — I4819 Other persistent atrial fibrillation: Secondary | ICD-10-CM

## 2022-05-16 NOTE — Telephone Encounter (Signed)
Eliquis '5mg'$  refill request received. Patient is 76 years old, weight-107.9kg, Crea-0.98 on 01/15/2022, Diagnosis-Afib, and last seen by Dr. [name] on 03/06/2022. Dose is appropriate based on dosing criteria. Will send in refill to requested pharmacy.

## 2022-05-17 ENCOUNTER — Ambulatory Visit: Payer: Medicare Other | Admitting: Psychology

## 2022-05-24 ENCOUNTER — Ambulatory Visit: Payer: Medicare Other | Admitting: Psychology

## 2022-05-31 ENCOUNTER — Ambulatory Visit: Payer: Medicare Other | Admitting: Psychology

## 2022-06-04 ENCOUNTER — Encounter: Payer: Medicare Other | Admitting: Orthopaedic Surgery

## 2022-06-06 ENCOUNTER — Other Ambulatory Visit: Payer: Self-pay | Admitting: Physician Assistant

## 2022-06-06 DIAGNOSIS — M1611 Unilateral primary osteoarthritis, right hip: Secondary | ICD-10-CM

## 2022-06-07 ENCOUNTER — Encounter: Payer: Self-pay | Admitting: Emergency Medicine

## 2022-06-07 ENCOUNTER — Ambulatory Visit: Payer: Medicare Other | Admitting: Psychology

## 2022-06-07 ENCOUNTER — Ambulatory Visit (INDEPENDENT_AMBULATORY_CARE_PROVIDER_SITE_OTHER): Payer: Medicare Other | Admitting: Emergency Medicine

## 2022-06-07 VITALS — BP 128/62 | HR 64 | Temp 98.0°F | Ht 65.0 in | Wt 235.0 lb

## 2022-06-07 DIAGNOSIS — S91132A Puncture wound without foreign body of left great toe without damage to nail, initial encounter: Secondary | ICD-10-CM

## 2022-06-07 DIAGNOSIS — L03011 Cellulitis of right finger: Secondary | ICD-10-CM | POA: Diagnosis not present

## 2022-06-07 MED ORDER — AZITHROMYCIN 250 MG PO TABS: ORAL_TABLET | ORAL | 0 refills | Status: DC
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
## 2022-06-07 NOTE — Patient Instructions (Signed)
Paronychia Paronychia is an infection of the skin. It happens near a fingernail or toenail. It may cause pain and swelling around the nail. In some cases, a fluid-filled bump (abscess) can form near or under the nail. Often, this condition is not serious, and it clears up with treatment. What are the causes? This condition may be caused by a germ. The germ may be bacteria or a fungus. These germs can enter the body through an opening in the skin, such as a cut or a hangnail. Other causes include: Repeated injuries to your fingernails or toenails. Irritation of the base and sides of the nail (cuticle). What increases the risk? This condition is more likely to develop in people who: Get their hands wet often, such as a dishwasher. Bite their fingernails or the base and sides of their nails. Have other skin problems. Have hangnails or hurt fingertips. Come into contact with chemicals like detergents. Have diabetes. What are the signs or symptoms? Redness and swelling of the skin near the nail. A tender feeling around the nail. Pus-filled bumps under the skin at the base and sides of the nail. Fluid or pus under the nail. Pain in the area. How is this treated? Treatment depends on the cause of your condition and how bad it is. If your condition is mild, it may clear up on its own in a few days or after soaking in warm water. If needed, treatment may include: Antibiotic medicine. Antifungal medicine. A procedure to drain pus from a fluid-filled bump. Medicine to treat irritation and swelling (corticosteroids). Taking off part of an ingrown toenail. A bandage (dressing) may be placed over the nail area. Follow these instructions at home: Wound care Keep the affected area clean. Soak the fingers or toes in warm water as told by your doctor. You may be told to do this for 20 minutes, 2-3 times a day. Keep the area dry when you are not soaking it. Do not try to drain a fluid-filled bump on  your own. Follow instructions from your doctor about how to take care of the affected area. Make sure you: Wash your hands with soap and water for at least 20 seconds before and after you change your bandage. If you cannot use soap and water, use hand sanitizer. Change your bandage as told by your doctor. If you had a fluid-filled bump and your doctor drained it, check the area every day for signs of infection. Check for: Redness, swelling, or pain. Fluid or blood. Warmth. Pus or a bad smell. Medicines  Take over-the-counter and prescription medicines only as told by your doctor. If you were prescribed an antibiotic medicine, take it as told by your doctor. Do not stop taking it even if you start to feel better. General instructions Avoid contact with anything that irritates your skin or that you are allergic to. Do not pick at the affected area. Keep all follow-up visits. Prevention To prevent this condition from happening again: Wear rubber gloves when putting your hands in water for washing dishes or other tasks. Wear gloves if your hands might touch cleaners or chemicals. Avoid injuring your nails or fingertips. Do not bite your nails or tear hangnails. Do not cut your nails very short. Do not cut the skin at the base and sides of the nail. Use clean nail clippers or scissors when trimming nails. Contact a doctor if: You feel worse. You do not get better. You keep having or you have more fluid, blood, or   pus coming from the affected area. Your affected finger, toe, or joint gets swollen or hard to move. You have a fever or chills. There is redness spreading from the affected area. Summary Paronychia is an infection of the skin. It happens near a fingernail or toenail. This condition may cause pain and swelling around the nail. Soak the fingers or toes in warm water as told by your doctor. Often, this condition is not serious, and it clears up with treatment. This information  is not intended to replace advice given to you by your health care provider. Make sure you discuss any questions you have with your health care provider. Document Revised: 01/02/2021 Document Reviewed: 01/02/2021 Elsevier Patient Education  2023 Elsevier Inc.  

## 2022-06-07 NOTE — Assessment & Plan Note (Signed)
Smoking.  No need for incision and drainage. May benefit from antibiotic.  Allergic to penicillin. We will start daily azithromycin for 5 days.

## 2022-06-07 NOTE — Assessment & Plan Note (Signed)
No visible foreign body.  No obvious infection.

## 2022-06-13 NOTE — Pre-Procedure Instructions (Signed)
Surgical Instructions    Your procedure is scheduled on Tuesday, September 5th.  Report to [facility] Main Entrance "A" at 10:15 A.M., then check in with the Admitting office.  Call this number if you have problems the morning of surgery:  [phone]   If you have any questions prior to your surgery date call [phone]: Open Monday-Friday 8am-4pm    Remember:  Do not eat after midnight the night before your surgery  You may drink clear liquids until 9:15 a.m. the morning of your surgery.   Clear liquids allowed are: Water, Non-Citrus Juices (without pulp), Carbonated Beverages, Clear Tea, Black Coffee Only (NO MILK, CREAM OR POWDERED CREAMER of any kind), and Gatorade.    Enhanced Recovery after Surgery for Orthopedics Enhanced Recovery after Surgery is a protocol used to improve the stress on your body and your recovery after surgery.  Patient Instructions  The day of surgery (if you do NOT have diabetes):  Drink ONE (1) Pre-Surgery Clear Ensure by 9:15 am the morning of surgery   This drink was given to you during your hospital  pre-op appointment visit. Nothing else to drink after completing the  Pre-Surgery Clear Ensure.         If you have questions, please contact your surgeon's office.   Take these medicines the morning of surgery with A SIP OF WATER  amLODipine (NORVASC) flecainide (TAMBOCOR)  HYDROcodone-acetaminophen (NORCO)  metoprolol tartrate (LOPRESSOR) rosuvastatin (CRESTOR) sertraline (ZOLOFT)   Take these medications as needed: acetaminophen (TYLENOL)  ALPRAZolam (XANAX)  pantoprazole (PROTONIX)   Follow your surgeon's instructions on when to stop Apixaban (Eliquis).  If no instructions were given by your surgeon then you will need to call the office to get those instructions.    As of today, STOP taking any Aspirin (unless otherwise instructed by your surgeon) Aleve, Naproxen, Ibuprofen, Motrin, Advil, Goody's, BC's, all herbal medications, fish  oil, and all vitamins.                     Do NOT Smoke (Tobacco/Vaping) for 24 hours prior to your procedure.  If you use a CPAP at night, you may bring your mask/headgear for your overnight stay.   Contacts, glasses, piercing's, hearing aid's, dentures or partials may not be worn into surgery, please bring cases for these belongings.    For patients admitted to the hospital, discharge time will be determined by your treatment team.   Patients discharged the day of surgery will not be allowed to drive home, and someone needs to stay with them for 24 hours.  SURGICAL WAITING ROOM VISITATION Patients having surgery or a procedure may have no more than 2 support people in the waiting area - these visitors may rotate.   Children under the age of 6 must have an adult with them who is not the patient. If the patient needs to stay at the hospital during part of their recovery, the visitor guidelines for inpatient rooms apply. Pre-op nurse will coordinate an appropriate time for 1 support person to accompany patient in pre-op.  This support person may not rotate.   Please refer to the [facility] website for the visitor guidelines for Inpatients (after your surgery is over and you are in a regular room).    Special instructions:   [place]- Preparing For Surgery  Before surgery, you can play an important role. Because skin is not sterile, your skin needs to be as free of germs as possible. You can reduce  the number of germs on your skin by washing with CHG (chlorahexidine gluconate) Soap before surgery.  CHG is an antiseptic cleaner which kills germs and bonds with the skin to continue killing germs even after washing.    Oral Hygiene is also important to reduce your risk of infection.  Remember - BRUSH YOUR TEETH THE MORNING OF SURGERY WITH YOUR REGULAR TOOTHPASTE  Please do not use if you have an allergy to CHG or antibacterial soaps. If your skin becomes reddened/irritated stop using the  CHG.  Do not shave (including legs and underarms) for at least 48 hours prior to first CHG shower. It is OK to shave your face.  Please follow these instructions carefully.   Shower the NIGHT BEFORE SURGERY and the MORNING OF SURGERY  If you chose to wash your hair, wash your hair first as usual with your normal shampoo.  After you shampoo, rinse your hair and body thoroughly to remove the shampoo.  Use CHG Soap as you would any other liquid soap. You can apply CHG directly to the skin and wash gently with a scrungie or a clean washcloth.   Apply the CHG Soap to your body ONLY FROM THE NECK DOWN.  Do not use on open wounds or open sores. Avoid contact with your eyes, ears, mouth and genitals (private parts). Wash Face and genitals (private parts)  with your normal soap.   Wash thoroughly, paying special attention to the area where your surgery will be performed.  Thoroughly rinse your body with warm water from the neck down.  DO NOT shower/wash with your normal soap after using and rinsing off the CHG Soap.  Pat yourself dry with a CLEAN TOWEL.  Wear CLEAN PAJAMAS to bed the night before surgery  Place CLEAN SHEETS on your bed the night before your surgery  DO NOT SLEEP WITH PETS.   Day of Surgery: Take a shower with CHG soap. Do not wear jewelry. Do not wear lotions, powders, perfumes, or deodorant. Do not shave 48 hours prior to surgery.  Do not bring valuables to the hospital.  [facility] is not responsible for any belongings or valuables. Do not wear nail polish, gel polish, artificial nails, or any other type of covering on natural nails (fingers and toes) If you have artificial nails or gel coating that need to be removed by a nail salon, please have this removed prior to surgery. Artificial nails or gel coating may interfere with anesthesia's ability to adequately monitor your vital signs. Wear Clean/Comfortable clothing the morning of surgery Remember to brush your  teeth WITH YOUR REGULAR TOOTHPASTE.   Please read over the following fact sheets that you were given.    If you received a COVID test during your pre-op visit  it is requested that you wear a mask when out in public, stay away from anyone that may not be feeling well and notify your surgeon if you develop symptoms. If you have been in contact with anyone that has tested positive in the last 10 days please notify you surgeon.

## 2022-06-14 ENCOUNTER — Encounter (HOSPITAL_COMMUNITY): Payer: Self-pay

## 2022-06-14 ENCOUNTER — Other Ambulatory Visit: Payer: Self-pay

## 2022-06-14 ENCOUNTER — Ambulatory Visit: Payer: Medicare Other | Admitting: Psychology

## 2022-06-14 ENCOUNTER — Encounter (HOSPITAL_COMMUNITY)
Admission: RE | Admit: 2022-06-14 | Discharge: 2022-06-14 | Disposition: A | Discharge status: Home / Self Care | Payer: Medicare Other | Source: Ambulatory Visit | Attending: Orthopaedic Surgery | Admitting: Orthopaedic Surgery

## 2022-06-14 VITALS — BP 136/74 | HR 66 | Temp 97.8°F | Resp 17 | Ht 65.0 in | Wt 233.3 lb

## 2022-06-14 DIAGNOSIS — E871 Hypo-osmolality and hyponatremia: Secondary | ICD-10-CM | POA: Insufficient documentation

## 2022-06-14 DIAGNOSIS — I471 Supraventricular tachycardia: Secondary | ICD-10-CM | POA: Insufficient documentation

## 2022-06-14 DIAGNOSIS — I10 Essential (primary) hypertension: Secondary | ICD-10-CM | POA: Diagnosis not present

## 2022-06-14 DIAGNOSIS — M069 Rheumatoid arthritis, unspecified: Secondary | ICD-10-CM | POA: Diagnosis not present

## 2022-06-14 DIAGNOSIS — I2510 Atherosclerotic heart disease of native coronary artery without angina pectoris: Secondary | ICD-10-CM | POA: Diagnosis not present

## 2022-06-14 DIAGNOSIS — M1611 Unilateral primary osteoarthritis, right hip: Secondary | ICD-10-CM | POA: Diagnosis not present

## 2022-06-14 DIAGNOSIS — I480 Paroxysmal atrial fibrillation: Secondary | ICD-10-CM | POA: Diagnosis not present

## 2022-06-14 DIAGNOSIS — Z01812 Encounter for preprocedural laboratory examination: Secondary | ICD-10-CM | POA: Insufficient documentation

## 2022-06-14 DIAGNOSIS — Z9049 Acquired absence of other specified parts of digestive tract: Secondary | ICD-10-CM | POA: Insufficient documentation

## 2022-06-14 DIAGNOSIS — Z01818 Encounter for other preprocedural examination: Secondary | ICD-10-CM

## 2022-06-14 DIAGNOSIS — K219 Gastro-esophageal reflux disease without esophagitis: Secondary | ICD-10-CM | POA: Insufficient documentation

## 2022-06-14 DIAGNOSIS — G4733 Obstructive sleep apnea (adult) (pediatric): Secondary | ICD-10-CM | POA: Diagnosis not present

## 2022-06-14 HISTORY — DX: Rheumatoid arthritis, unspecified: M06.9

## 2022-06-14 LAB — BASIC METABOLIC PANEL
Anion gap: 9 (ref 5–15)
BUN: 11 mg/dL (ref 8–23)
CO2: 21 mmol/L — ABNORMAL LOW (ref 22–32)
Calcium: 9.6 mg/dL (ref 8.9–10.3)
Chloride: 100 mmol/L (ref 98–111)
Creatinine, Ser: 0.81 mg/dL (ref 0.44–1.00)
GFR, Estimated: >60 mL/min (ref >60)
Glucose, Bld: 92 mg/dL (ref 70–99)
Potassium: 4.5 mmol/L (ref 3.5–5.1)
Sodium: 130 mmol/L — ABNORMAL LOW (ref 135–145)

## 2022-06-14 LAB — CBC
HCT: 39.4 % (ref 36.0–46.0)
Hemoglobin: 13.4 g/dL (ref 12.0–15.0)
MCH: 32.3 pg (ref 26.0–34.0)
MCHC: 34 g/dL (ref 30.0–36.0)
MCV: 94.9 fL (ref 80.0–100.0)
Platelets: 173 10*3/uL (ref 150–400)
RBC: 4.15 MIL/uL (ref 3.87–5.11)
RDW: 12.3 % (ref 11.5–15.5)
WBC: 5 10*3/uL (ref 4.0–10.5)
nRBC: 0 % (ref 0.0–0.2)

## 2022-06-14 LAB — SURGICAL PCR SCREEN
MRSA, PCR: NEGATIVE
Staphylococcus aureus: NEGATIVE

## 2022-06-14 LAB — TYPE AND SCREEN
ABO/RH(D): A POS
Antibody Screen: NEGATIVE

## 2022-06-14 NOTE — Progress Notes (Signed)
PCP - Dr. Lew Dawes Cardiologist - Dr.Paula [name]  PPM/ICD - n/a  Chest x-ray - 08/19/21 EKG - 01/16/22 Stress Test - 05/31/20 ECHO -12/09/19  Cardiac Cath - denies  Sleep Study - OSA+ CPAP - uses nightly  Blood Thinner Instructions: Per cardiac clearance letter, pt is to hold Eliquis x3 days preop. LD will be 06/15/22 Aspirin Instructions: n/a  ERAS Protcol -Clear liquids until 0915 DOS PRE-SURGERY Ensure or G2- Ensure provided.  COVID TEST- n/a  Anesthesia review: Yes, cardiac clearance received in 01/2022. Shelby Dubin, PA-C notified during PAT appt and will be following up.   Patient denies shortness of breath, fever, cough and chest pain at PAT appointment   All instructions explained to the patient, with a verbal understanding of the material. Patient agrees to go over the instructions while at home for a better understanding. Patient also instructed to self quarantine after being tested for COVID-19. The opportunity to ask questions was provided.

## 2022-06-15 ENCOUNTER — Encounter (HOSPITAL_COMMUNITY): Payer: Self-pay

## 2022-06-15 NOTE — Anesthesia Preprocedure Evaluation (Signed)
Anesthesia Evaluation  Patient identified by MRN, date of birth, ID band Patient awake    Reviewed: Allergy & Precautions, NPO status , Patient's Chart, lab work & pertinent test results, reviewed documented beta blocker date and time   History of Anesthesia Complications (+) DIFFICULT IV STICK / SPECIAL LINE  Airway Mallampati: II  TM Distance: >3 FB Neck ROM: Full    Dental no notable dental hx. (+) Teeth Intact, Dental Advisory Given   Pulmonary shortness of breath, asthma , sleep apnea and Continuous Positive Airway Pressure Ventilation   Pulmonary exam normal  breath sounds clear to auscultation       Cardiovascular hypertension, Pt. on medications and Pt. on home beta blockers Normal cardiovascular exam+ dysrhythmias Atrial Fibrillation and Supra Ventricular Tachycardia  Rhythm:Regular Rate:Normal  EKG:01/15/22: Sinus rhythm with 1st degree A-V block Otherwise normal ECG When compared with ECG of 01-Sep-2021 08:25, PREVIOUS ECG IS PRESENT no sig change from previous Confirmed by Charlesetta Shanks [phone]) on 01/16/2022 4:00:47 PM   CV: Long term cardiac monitor 07/04/21-07/12/21: Predominant rhythm is sinus rhythm Rates 51 to 104 bpm Average HR 67 bpm Frequent SVT, longest lasting 1 min 39 sec with average HR 156 bpm Fastest HR 7 beats at 210 bpm Rare PVCs  Triggered events correlated with SR with PVC and also SVT  Echo 12/09/19: IMPRESSIONS  1. Left ventricular ejection fraction, by estimation, is 55 to 60%. The  left ventricle has normal function. The left ventricle has no regional  wall motion abnormalities. There is mild concentric left ventricular  hypertrophy. Left ventricular diastolic  parameters were normal.  2. Right ventricular systolic function is normal. The right ventricular  size is normal. There is moderately elevated pulmonary artery systolic  pressure.  3. The mitral valve is normal in  structure and function. Mild mitral  valve regurgitation. No evidence of mitral stenosis.  4. The aortic valve is tricuspid. Aortic valve regurgitation is not  visualized. No aortic stenosis is present.  5. The inferior vena cava is normal in size with <50% respiratory  variability, suggesting right atrial pressure of 8 mmHg.  -Comparison(s): 01/25/15 EF 55-60%. PA pressure 36mHg.  CT Cardiac Scoring 07/02/19: IMPRESSION: Coronary calcium score of 0. This was 0 percentile for age and sex matched control.   Neuro/Psych  Headaches PSYCHIATRIC DISORDERS  Depression       GI/Hepatic Neg liver ROS,GERD  ,,  Endo/Other  negative endocrine ROS    Renal/GU Renal diseaseChronic interstitial nephritis  negative genitourinary   Musculoskeletal  (+) Arthritis , Rheumatoid disorders,    Abdominal   Peds  Hematology  (+) Blood dyscrasia (on eliquis, last dose 9/1)   Anesthesia Other Findings History includes never smoker, HTN, paroxysmal afib/flutter/PSVT (s/p DCCV 08/28/21), OSA (uses CPAP), hyponatremia, sepsis with interstitial nephritis (2016), carotid artery disease (1-39% BICA 2018), GERD, RA, osteoarthritis (right TKA 09/02/18), cholecystectomy (11/16/21), obesity  Reproductive/Obstetrics                            Anesthesia Physical Anesthesia Plan  ASA: 3  Anesthesia Plan: Spinal   Post-op Pain Management: Tylenol PO (pre-op)*   Induction:   PONV Risk Score and Plan: 2 and Treatment may vary due to age or medical condition, Dexamethasone and Ondansetron  Airway Management Planned: Natural Airway  Additional Equipment:   Intra-op Plan:   Post-operative Plan:   Informed Consent: I have reviewed the patients History and Physical, chart, labs and discussed  the procedure including the risks, benefits and alternatives for the proposed anesthesia with the patient or authorized representative who has indicated his/her understanding and acceptance.      Dental advisory given  Plan Discussed with: CRNA  Anesthesia Plan Comments: (PAT note written 06/15/2022 by Myra Gianotti, PA-C. Reported need for ultrasound guidance for IV access in the past. )        Anesthesia Quick Evaluation

## 2022-06-15 NOTE — Progress Notes (Signed)
Anesthesia Chart Review: Amanda Wilkins  Case: [id number] Date/Time: 06/19/22 1010   Procedure: RIGHT TOTAL HIP ARTHROPLASTY ANTERIOR APPROACH (Right: Hip)   Anesthesia type: Spinal   Pre-op diagnosis: OSTEOARTHRITIS RIGHT HIP   Location: [facility] OR ROOM 07 / [facility] OR   Surgeons: Mcarthur Rossetti, MD       DISCUSSION: Patient is a 76 year old female scheduled for the above procedure.  History includes never smoker, HTN, paroxysmal afib/flutter/PSVT (s/p DCCV 08/28/21), OSA (uses CPAP), hyponatremia, sepsis with interstitial nephritis (2016), carotid artery disease (1-39% BICA 2018), GERD, RA, osteoarthritis (right TKA 09/02/18), cholecystectomy (11/16/21), obesity.   Last visit with cardiologist Dr. [name] on 03/06/22. She was maintaining SR. She wrote, "Preop assessment  PT is low risk for planned ortho procedure  OK to proceed   Follow on tele  Continue meds".  Follow-up next winter planned. She had actually had a preoperative visit with Melina Copa, PA-C on 01/29/22 that also addressed holding Eliquis for 3 days (last dose planned for 06/15/22). Her amlodipine was recently increased to 1.25 mg daily for HTN, but otherwise she reported no new changes or symptoms from a cardiac standpoint.     Anesthesia team to evaluate on the day of surgery. She did report that she is a difficult IV stick and has required ultrasound guidance previously.     VS: BP 136/74   Pulse 66   Temp 36.6 C (Oral)   Resp 17   Ht '5\' 5"'$  (1.651 m)   Wt 105.8 kg   SpO2 98%   BMI 38.82 kg/m   PROVIDERS: Plotnikov, Evie Lacks, MD is PCP  [name], MD is cardiologist [name], MD is rheumatologist   LABS: Labs reviewed: Acceptable for surgery. (all labs ordered are listed, but only abnormal results are displayed)  Labs Reviewed  BASIC METABOLIC PANEL - Abnormal; Notable for the following components:      Result Value   Sodium 130 (*)    CO2 21 (*)    All other components within normal limits  SURGICAL PCR  SCREEN  CBC  TYPE AND SCREEN    PFTs 07/31/19: FVC 2.92 (93%), FEV1 2.28 (96%). DLCO unc 20.93 (102%)     IMAGES: CT Abd/pelvis 01/15/22: IMPRESSION: 1. Thickening of the colonic wall from the descending colon through the rectum. Diverticula are noted but do not appear inflamed; findings favored to reflect infectious or inflammatory colitis over acute diverticulitis. 2. Nonobstructing right upper pole renal stone.   MRI Right Hip/Femur 09/23/21: IMPRESSION: 1. Severe osteoarthritis of the right hip with subchondral reactive marrow edema. 2. Muscles are normal without focal abnormality or atrophy around the pelvis and right thigh.   1V PCXR 09/01/21: FINDINGS: Cardiac size is unremarkable. There are no signs of alveolar pulmonary edema or focal pulmonary consolidation. There is slight prominence of peribronchial interstitial markings with no significant interval change. There is no pleural effusion or pneumothorax. IMPRESSION: There are no new infiltrates or signs of pulmonary edema.     EKG: 01/15/22: Sinus rhythm with 1st degree A-V block Otherwise normal ECG When compared with ECG of 01-Sep-2021 08:25, PREVIOUS ECG IS PRESENT no sig change from previous Confirmed by Charlesetta Shanks [phone]) on 01/16/2022 4:00:47 PM    CV: Long term cardiac monitor 07/04/21-07/12/21:  Predominant rhythm is sinus rhythm   Rates 51 to 104 bpm   Average HR 67 bpm   Frequent SVT, longest lasting 1 min 39 sec with average HR 156 bpm   Fastest HR  7 beats at 210 bpm    Rare PVCs   Triggered events correlated with SR with PVC and also SVT       CPX 05/31/20: Conclusion: Exercise testing with gas exchange demonstrates normal functional capacity when compared to matched sedentary norms. There is no clear indication for cardiopulmonary abnormality. Patient's body habitus appears  Primary role in her exercise limitation. VE/VCO2 slope is elevated and could be indicative of increased pulmonary pressures,  however appears more likely hyperventilation with low PETCO2. Patient was advised, if cardiology cleared, to begin 3x 10 min bouts of cardio exercise throughout her day to help improve dyspnea with activity.  - Preliminary impression by: Landis Martins, MS, ACSM-RCEP  - Final impression by: Glori Bickers, MD  Agree with above. Overall normal peak VO2 for matched subjects. Exercise limitation seems to be primarily due to her body habitus but there is a hypertensive response to exercise and the VE/VCO2 slope is significantly elevated suggesting elevated pulmonary pressures during exercise. This can be related to diastolic dysfunction or baseline OHS. Agree with plan for structured exercise program/weight loss and then retesting as needed with or without invasive monitoring.      Echo 12/09/19: IMPRESSIONS   1. Left ventricular ejection fraction, by estimation, is 55 to 60%. The  left ventricle has normal function. The left ventricle has no regional  wall motion abnormalities. There is mild concentric left ventricular  hypertrophy. Left ventricular diastolic  parameters were normal.   2. Right ventricular systolic function is normal. The right ventricular  size is normal. There is moderately elevated pulmonary artery systolic  pressure.   3. The mitral valve is normal in structure and function. Mild mitral  valve regurgitation. No evidence of mitral stenosis.   4. The aortic valve is tricuspid. Aortic valve regurgitation is not  visualized. No aortic stenosis is present.   5. The inferior vena cava is normal in size with <50% respiratory  variability, suggesting right atrial pressure of 8 mmHg.  - Comparison(s): 01/25/15 EF 55-60%. PA pressure 60mHg.      CT Cardiac Scoring 07/02/19: IMPRESSION: Coronary calcium score of 0. This was 0 percentile for age and sex matched control.     [place] 08/30/17: Final Interpretation:  - Right Carotid: There is evidence in the right ICA of a  1-39% stenosis. The RICA velocities remain within normal range and stable compared to the prior exam.  - Left Carotid: There is evidence in the left ICA of a 1-39% stenosis. The LICA velocities remain within normal range and stable compared to the prior exam.  - Vertebrals:  Both vertebral arteries were patent with antegrade flow.  - Subclavians: Normal flow hemodynamics were seen in bilateral subclavian arteries.  - Suggest follow up PRN.     Past Medical History:  Diagnosis Date   Acute bronchitis 09/11/2016   11/17 refractory   Acute cystitis without hematuria 05/17/2015   Acute kidney injury ([facility]    Labs today   Acute right ankle pain 09/09/2018   Anticoagulant long-term use    Xarelto   Axillary pain    Bronchitis    Cellulitis    Cholecystitis    Chronic interstitial nephritis    CONJUNCTIVITIS, ACUTE 10/18/2010   Qualifier: Diagnosis of  By: Plotnikov MD, AEvie Lacks   D-dimer, elevated    Depression    Eye inflammation    GERD (gastroesophageal reflux disease)    History of interstitial nephritis 2016   chronic   History  of kidney stones    has a small one found on xray   History of nuclear stress test 12/16/2014   Intermediate risk nuclear study w/ medium size moderate severity reversible defect in the basal and mid inferolateral and inferior wall (per dr cardiology note , dr [name] did not think this was consistent with ischemia)/  normal LV function and wall motion, ef 75%   History of septic shock 01/22/2015   in setting Group A Strep Cellulits erysipelas/chest wall induration with streptoccocus basterium-- Severe sepsis, DIC, Acute respiratory failure with pulmonary edema, Acute Kidney failure with chronic interstitial nephritis   Hypertension    Hyponatremia    Migraines    on Zoloft for migraines   Mild carotid artery disease ([place])    per duplex 08-30-2017 bilateral ICA 1-39%   OA (osteoarthritis) rheumotologist-  dr [name]   both knees,   shoulders, ankles   OSA on CPAP     moderate obstructive sleep apnea with an AHI of 23.4/h and oxygen desaturations as low as 84%.  Now on CPAP at 12 cm H2O.   PAF (paroxysmal atrial fibrillation) [facility]) 2009   cardiologist-- dr [name]   Paroxysmal atrial flutter [facility])    a. dx 11/2017.   PSVT (paroxysmal supraventricular tachycardia) ([facility])    RA (rheumatoid arthritis) ([place])    Wears contact lenses     Past Surgical History:  Procedure Laterality Date   BREAST BIOPSY Right 05/15/2017   [place]   CARDIOVERSION N/A 08/28/2021   Procedure: CARDIOVERSION;  Surgeon: Fay Records, MD;  Location: [facility] ENDOSCOPY;  Service: Cardiovascular;  Laterality: N/A;   [facility] N/A 11/16/2021   Procedure: LAPAROSCOPIC CHOLECYSTECTOMY;  Surgeon: Ralene Ok, MD;  Location: [place];  Service: General;  Laterality: N/A;   ESOPHAGOGASTRODUODENOSCOPY N/A 02/08/2015   Procedure: ESOPHAGOGASTRODUODENOSCOPY (EGD);  Surgeon: [name], MD;  Location: [place];  Service: Endoscopy;  Laterality: N/A;   KNEE ARTHROSCOPY W/ MENISCAL REPAIR Left 08/2014    '@[facility]'$    SHOULDER SURGERY Right 04/04/2016   TOTAL KNEE ARTHROPLASTY Right 09/01/2018   Procedure: RIGHT TOTAL KNEE ARTHROPLASTY;  Surgeon: [name], MD;  Location: [facility] ORS;  Service: Orthopedics;  Laterality: Right;    MEDICATIONS:  acetaminophen (TYLENOL) 500 MG tablet   ALPRAZolam (XANAX) 0.25 MG tablet   amLODipine (NORVASC) 2.5 MG tablet   apixaban (ELIQUIS) 5 MG TABS tablet   cholecalciferol (VITAMIN D3) 25 MCG (1000 UT) tablet   EPINEPHrine 0.3 mg/0.3 mL IJ SOAJ injection   flecainide (TAMBOCOR) 50 MG tablet   gabapentin (NEURONTIN) 300 MG capsule   HYDROcodone-acetaminophen (NORCO) 10-325 MG tablet   hydroxychloroquine (PLAQUENIL) 200 MG tablet   metoprolol tartrate (LOPRESSOR) 25 MG tablet   pantoprazole (PROTONIX) 40 MG tablet   Probiotic Product (ALIGN) 4 MG CAPS   rosuvastatin (CRESTOR) 5 MG tablet    sertraline (ZOLOFT) 50 MG tablet   No current facility-administered medications for this encounter.    Myra Gianotti, PA-C Surgical Short Stay/Anesthesiology [facility] Phone [phone] [facility] Phone [phone] 06/15/2022 12:24 PM

## 2022-06-15 NOTE — Progress Notes (Signed)
Patient was called and informed that the surgery time for Tuesday, 06/19/22 was changed to 10:25 o'clock and she must be at the hospital at 08:25 o'clock. Patient was instructed to stop clear liquids and finished Ensure at 07:25 o'clock. Patient verbalized understanding.

## 2022-06-18 ENCOUNTER — Telehealth: Payer: Self-pay | Admitting: *Deleted
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
## 2022-06-18 NOTE — Telephone Encounter (Signed)
[facility]tho [facility] pre-op call completed. 

## 2022-06-18 NOTE — Care Plan (Signed)
[facility] RNCM call to patient to discuss her upcoming Right total hip arthroplasty with Dr. Ninfa Linden on 06/19/22. She is a [facility] patient and is agreeable to case management. She has a spouse that will be assisting after discharge. She has a RW and 3in1/BSC. No further DME is needed. Anticipate HHPT will be needed after a short hospital stay. Referral to [facility] after choice provided. Reviewed all post op care instructions. Will continue to follow for CM needs.

## 2022-06-19 ENCOUNTER — Ambulatory Visit (HOSPITAL_COMMUNITY): Payer: Medicare Other | Admitting: Vascular Surgery

## 2022-06-19 ENCOUNTER — Encounter (HOSPITAL_COMMUNITY): Payer: Self-pay | Admitting: Orthopaedic Surgery

## 2022-06-19 ENCOUNTER — Ambulatory Visit (HOSPITAL_COMMUNITY): Payer: Medicare Other

## 2022-06-19 ENCOUNTER — Observation Stay (HOSPITAL_COMMUNITY)
Admission: RE | Admit: 2022-06-19 | Discharge: 2022-06-20 | Disposition: A | Discharge status: Home / Self Care | Payer: Medicare Other | Attending: Orthopaedic Surgery | Admitting: Orthopaedic Surgery

## 2022-06-19 ENCOUNTER — Encounter (HOSPITAL_COMMUNITY)
Admission: RE | Disposition: A | Discharge status: Home / Self Care | Payer: Self-pay | Source: Home / Self Care | Attending: Orthopaedic Surgery

## 2022-06-19 ENCOUNTER — Other Ambulatory Visit: Payer: Self-pay

## 2022-06-19 ENCOUNTER — Ambulatory Visit (HOSPITAL_BASED_OUTPATIENT_CLINIC_OR_DEPARTMENT_OTHER): Payer: Medicare Other | Admitting: Anesthesiology

## 2022-06-19 ENCOUNTER — Observation Stay (HOSPITAL_COMMUNITY): Payer: Medicare Other

## 2022-06-19 DIAGNOSIS — M1611 Unilateral primary osteoarthritis, right hip: Principal | ICD-10-CM | POA: Insufficient documentation

## 2022-06-19 DIAGNOSIS — Z96641 Presence of right artificial hip joint: Secondary | ICD-10-CM

## 2022-06-19 DIAGNOSIS — M169 Osteoarthritis of hip, unspecified: Secondary | ICD-10-CM | POA: Diagnosis present

## 2022-06-19 DIAGNOSIS — Z9989 Dependence on other enabling machines and devices: Secondary | ICD-10-CM

## 2022-06-19 DIAGNOSIS — Z79899 Other long term (current) drug therapy: Secondary | ICD-10-CM | POA: Insufficient documentation

## 2022-06-19 DIAGNOSIS — G4733 Obstructive sleep apnea (adult) (pediatric): Secondary | ICD-10-CM

## 2022-06-19 DIAGNOSIS — Z96651 Presence of right artificial knee joint: Secondary | ICD-10-CM | POA: Insufficient documentation

## 2022-06-19 DIAGNOSIS — I480 Paroxysmal atrial fibrillation: Secondary | ICD-10-CM | POA: Insufficient documentation

## 2022-06-19 DIAGNOSIS — I10 Essential (primary) hypertension: Secondary | ICD-10-CM | POA: Insufficient documentation

## 2022-06-19 DIAGNOSIS — Z7901 Long term (current) use of anticoagulants: Secondary | ICD-10-CM | POA: Diagnosis not present

## 2022-06-19 DIAGNOSIS — I471 Supraventricular tachycardia: Secondary | ICD-10-CM

## 2022-06-19 DIAGNOSIS — Z471 Aftercare following joint replacement surgery: Secondary | ICD-10-CM | POA: Diagnosis not present

## 2022-06-19 HISTORY — PX: TOTAL HIP ARTHROPLASTY: SHX124

## 2022-06-19 SURGERY — ARTHROPLASTY, HIP, TOTAL, ANTERIOR APPROACH
Anesthesia: Spinal | Site: Hip | Laterality: Right

## 2022-06-19 MED ORDER — HYDROMORPHONE HCL 1 MG/ML IJ SOLN: 0.5000 mg | INTRAMUSCULAR | Status: DC | PRN

## 2022-06-19 MED ORDER — MUPIROCIN 2 % EX OINT: 1.0000 | TOPICAL_OINTMENT | Freq: Once | CUTANEOUS | Status: AC

## 2022-06-19 MED ORDER — METOCLOPRAMIDE HCL 5 MG/ML IJ SOLN: 5.0000 mg | Freq: Three times a day (TID) | INTRAMUSCULAR | Status: DC | PRN

## 2022-06-19 MED ORDER — VANCOMYCIN HCL IN DEXTROSE 1-5 GM/200ML-% IV SOLN
1000.0000 mg | INTRAVENOUS | Status: AC
  Administered 2022-06-19: 1000 mg via INTRAVENOUS
  Filled 2022-06-19: qty 200

## 2022-06-19 MED ORDER — HYDROCODONE-ACETAMINOPHEN 10-325 MG PO TABS
1.0000 | ORAL_TABLET | ORAL | Status: DC | PRN
  Administered 2022-06-19 – 2022-06-20 (×5): 1 via ORAL
  Filled 2022-06-19 (×5): qty 1

## 2022-06-19 MED ORDER — MIDAZOLAM HCL 2 MG/2ML IJ SOLN
INTRAMUSCULAR | Status: AC
  Filled 2022-06-19: qty 2

## 2022-06-19 MED ORDER — ONDANSETRON HCL 4 MG/2ML IJ SOLN
INTRAMUSCULAR | Status: AC
  Filled 2022-06-19: qty 2

## 2022-06-19 MED ORDER — ALUM & MAG HYDROXIDE-SIMETH 200-200-20 MG/5ML PO SUSP: 30.0000 mL | ORAL | Status: DC | PRN

## 2022-06-19 MED ORDER — SODIUM CHLORIDE 0.9 % IV SOLN: INTRAVENOUS | Status: DC

## 2022-06-19 MED ORDER — LACTATED RINGERS IV SOLN: INTRAVENOUS | Status: DC

## 2022-06-19 MED ORDER — MENTHOL 3 MG MT LOZG: 1.0000 | LOZENGE | OROMUCOSAL | Status: DC | PRN

## 2022-06-19 MED ORDER — HYDROMORPHONE HCL 2 MG PO TABS
2.0000 mg | ORAL_TABLET | ORAL | Status: DC | PRN
  Administered 2022-06-19: 2 mg via ORAL
  Filled 2022-06-19: qty 1

## 2022-06-19 MED ORDER — PROPOFOL 10 MG/ML IV BOLUS
INTRAVENOUS | Status: AC
  Filled 2022-06-19: qty 20

## 2022-06-19 MED ORDER — FLECAINIDE ACETATE 50 MG PO TABS
75.0000 mg | ORAL_TABLET | Freq: Two times a day (BID) | ORAL | Status: DC
  Administered 2022-06-19 – 2022-06-20 (×2): 75 mg via ORAL
  Filled 2022-06-19 (×3): qty 2

## 2022-06-19 MED ORDER — PHENYLEPHRINE HCL-NACL 20-0.9 MG/250ML-% IV SOLN
INTRAVENOUS | Status: DC | PRN
  Administered 2022-06-19: 25 ug/min via INTRAVENOUS

## 2022-06-19 MED ORDER — HYDROXYCHLOROQUINE SULFATE 200 MG PO TABS
400.0000 mg | ORAL_TABLET | Freq: Every morning | ORAL | Status: DC
  Administered 2022-06-20: 400 mg via ORAL
  Filled 2022-06-19: qty 2

## 2022-06-19 MED ORDER — METOPROLOL TARTRATE 25 MG PO TABS
25.0000 mg | ORAL_TABLET | Freq: Two times a day (BID) | ORAL | Status: DC
  Administered 2022-06-19 – 2022-06-20 (×2): 25 mg via ORAL
  Filled 2022-06-19 (×2): qty 1

## 2022-06-19 MED ORDER — OXYCODONE HCL 5 MG PO TABS: 5.0000 mg | ORAL_TABLET | ORAL | Status: DC | PRN

## 2022-06-19 MED ORDER — MIDAZOLAM HCL 2 MG/2ML IJ SOLN
INTRAMUSCULAR | Status: DC | PRN
  Administered 2022-06-19 (×2): 1 mg via INTRAVENOUS

## 2022-06-19 MED ORDER — ROSUVASTATIN CALCIUM 5 MG PO TABS
5.0000 mg | ORAL_TABLET | Freq: Every day | ORAL | Status: DC
  Administered 2022-06-20: 5 mg via ORAL
  Filled 2022-06-19: qty 1

## 2022-06-19 MED ORDER — 0.9 % SODIUM CHLORIDE (POUR BTL) OPTIME
TOPICAL | Status: DC | PRN
  Administered 2022-06-19: 1000 mL

## 2022-06-19 MED ORDER — ACETAMINOPHEN 325 MG PO TABS: 325.0000 mg | ORAL_TABLET | Freq: Four times a day (QID) | ORAL | Status: DC | PRN

## 2022-06-19 MED ORDER — TRANEXAMIC ACID-NACL 1000-0.7 MG/100ML-% IV SOLN
1000.0000 mg | INTRAVENOUS | Status: AC
  Administered 2022-06-19: 1000 mg via INTRAVENOUS
  Filled 2022-06-19: qty 100

## 2022-06-19 MED ORDER — BUPIVACAINE IN DEXTROSE 0.75-8.25 % IT SOLN
INTRATHECAL | Status: DC | PRN
  Administered 2022-06-19: 1.8 mL via INTRATHECAL

## 2022-06-19 MED ORDER — ALPRAZOLAM 0.5 MG PO TABS
0.2500 mg | ORAL_TABLET | Freq: Every day | ORAL | Status: DC | PRN
  Administered 2022-06-20: 0.25 mg via ORAL
  Filled 2022-06-19: qty 1

## 2022-06-19 MED ORDER — EPHEDRINE SULFATE-NACL 50-0.9 MG/10ML-% IV SOSY
PREFILLED_SYRINGE | INTRAVENOUS | Status: DC | PRN
  Administered 2022-06-19: 5 mg via INTRAVENOUS

## 2022-06-19 MED ORDER — ONDANSETRON HCL 4 MG/2ML IJ SOLN: 4.0000 mg | Freq: Four times a day (QID) | INTRAMUSCULAR | Status: DC | PRN

## 2022-06-19 MED ORDER — DOCUSATE SODIUM 100 MG PO CAPS
100.0000 mg | ORAL_CAPSULE | Freq: Two times a day (BID) | ORAL | Status: DC
  Administered 2022-06-19 – 2022-06-20 (×2): 100 mg via ORAL
  Filled 2022-06-19 (×2): qty 1

## 2022-06-19 MED ORDER — AMLODIPINE BESYLATE 2.5 MG PO TABS: 1.2500 mg | ORAL_TABLET | Freq: Every day | ORAL | Status: DC

## 2022-06-19 MED ORDER — ALBUMIN HUMAN 5 % IV SOLN: INTRAVENOUS | Status: DC | PRN

## 2022-06-19 MED ORDER — PHENYLEPHRINE 80 MCG/ML (10ML) SYRINGE FOR IV PUSH (FOR BLOOD PRESSURE SUPPORT): PREFILLED_SYRINGE | INTRAVENOUS | Status: DC | PRN

## 2022-06-19 MED ORDER — FENTANYL CITRATE (PF) 250 MCG/5ML IJ SOLN
INTRAMUSCULAR | Status: AC
  Filled 2022-06-19: qty 5

## 2022-06-19 MED ORDER — POVIDONE-IODINE 10 % EX SWAB: 2.0000 | Freq: Once | CUTANEOUS | Status: DC

## 2022-06-19 MED ORDER — ORAL CARE MOUTH RINSE: 15.0000 mL | Freq: Once | OROMUCOSAL | Status: AC

## 2022-06-19 MED ORDER — VITAMIN D 25 MCG (1000 UNIT) PO TABS
1000.0000 [IU] | ORAL_TABLET | Freq: Every day | ORAL | Status: DC
  Administered 2022-06-20: 1000 [IU] via ORAL
  Filled 2022-06-19: qty 1

## 2022-06-19 MED ORDER — PANTOPRAZOLE SODIUM 40 MG PO TBEC
40.0000 mg | DELAYED_RELEASE_TABLET | Freq: Every day | ORAL | Status: DC
  Administered 2022-06-20: 40 mg via ORAL
  Filled 2022-06-19: qty 1

## 2022-06-19 MED ORDER — ONDANSETRON HCL 4 MG PO TABS: 4.0000 mg | ORAL_TABLET | Freq: Four times a day (QID) | ORAL | Status: DC | PRN

## 2022-06-19 MED ORDER — METOCLOPRAMIDE HCL 5 MG PO TABS: 5.0000 mg | ORAL_TABLET | Freq: Three times a day (TID) | ORAL | Status: DC | PRN

## 2022-06-19 MED ORDER — APIXABAN 5 MG PO TABS
5.0000 mg | ORAL_TABLET | Freq: Two times a day (BID) | ORAL | Status: DC
  Administered 2022-06-20: 5 mg via ORAL
  Filled 2022-06-19 (×2): qty 1

## 2022-06-19 MED ORDER — MUPIROCIN 2 % EX OINT
TOPICAL_OINTMENT | CUTANEOUS | Status: AC
  Administered 2022-06-19: 1 via TOPICAL
  Filled 2022-06-19: qty 22

## 2022-06-19 MED ORDER — FENTANYL CITRATE (PF) 100 MCG/2ML IJ SOLN
INTRAMUSCULAR | Status: AC
  Filled 2022-06-19: qty 2

## 2022-06-19 MED ORDER — SODIUM CHLORIDE 0.9 % IR SOLN
Status: DC | PRN
  Administered 2022-06-19: 1000 mL

## 2022-06-19 MED ORDER — FENTANYL CITRATE (PF) 100 MCG/2ML IJ SOLN
25.0000 ug | INTRAMUSCULAR | Status: DC | PRN
  Administered 2022-06-19: 25 ug via INTRAVENOUS

## 2022-06-19 MED ORDER — PHENOL 1.4 % MT LIQD: 1.0000 | OROMUCOSAL | Status: DC | PRN

## 2022-06-19 MED ORDER — SERTRALINE HCL 50 MG PO TABS
50.0000 mg | ORAL_TABLET | Freq: Every day | ORAL | Status: DC
  Administered 2022-06-20: 50 mg via ORAL
  Filled 2022-06-19: qty 1

## 2022-06-19 MED ORDER — METHOCARBAMOL 500 MG PO TABS
500.0000 mg | ORAL_TABLET | Freq: Four times a day (QID) | ORAL | Status: DC | PRN
  Administered 2022-06-19: 500 mg via ORAL
  Filled 2022-06-19: qty 1

## 2022-06-19 MED ORDER — PROPOFOL 500 MG/50ML IV EMUL
INTRAVENOUS | Status: DC | PRN
  Administered 2022-06-19: 75 ug/kg/min via INTRAVENOUS
  Administered 2022-06-19: 50 ug/kg/min via INTRAVENOUS

## 2022-06-19 MED ORDER — METHOCARBAMOL 1000 MG/10ML IJ SOLN: 500.0000 mg | Freq: Four times a day (QID) | INTRAVENOUS | Status: DC | PRN

## 2022-06-19 MED ORDER — ACETAMINOPHEN 500 MG PO TABS
1000.0000 mg | ORAL_TABLET | Freq: Once | ORAL | Status: AC
  Administered 2022-06-19: 1000 mg via ORAL
  Filled 2022-06-19: qty 2

## 2022-06-19 MED ORDER — CHLORHEXIDINE GLUCONATE 0.12 % MT SOLN
15.0000 mL | Freq: Once | OROMUCOSAL | Status: AC
  Administered 2022-06-19: 15 mL via OROMUCOSAL
  Filled 2022-06-19: qty 15

## 2022-06-19 MED ORDER — PROPOFOL 10 MG/ML IV BOLUS
INTRAVENOUS | Status: DC | PRN
  Administered 2022-06-19 (×2): 50 mg via INTRAVENOUS

## 2022-06-19 SURGICAL SUPPLY — 53 items
BAG COUNTER SPONGE SURGICOUNT (BAG) ×1 IMPLANT
BENZOIN TINCTURE PRP APPL 2/3 (GAUZE/BANDAGES/DRESSINGS) ×1 IMPLANT
BLADE CLIPPER SURG (BLADE) IMPLANT
BLADE SAW SGTL 18X1.27X75 (BLADE) ×1 IMPLANT
COVER SURGICAL LIGHT HANDLE (MISCELLANEOUS) ×1 IMPLANT
CUP SECTOR GRIPTON 50MM (Cup) IMPLANT
DRAPE C-ARM 42X72 X-RAY (DRAPES) ×1 IMPLANT
DRAPE STERI IOBAN 125X83 (DRAPES) ×1 IMPLANT
DRAPE U-SHAPE 47X51 STRL (DRAPES) ×3 IMPLANT
DRSG AQUACEL AG ADV 3.5X10 (GAUZE/BANDAGES/DRESSINGS) ×1 IMPLANT
DURAPREP 26ML APPLICATOR (WOUND CARE) ×1 IMPLANT
ELECT BLADE 4.0 EZ CLEAN MEGAD (MISCELLANEOUS) ×1
ELECT BLADE 6.5 EXT (BLADE) IMPLANT
ELECT REM PT RETURN 9FT ADLT (ELECTROSURGICAL) ×1
ELECTRODE BLDE 4.0 EZ CLN MEGD (MISCELLANEOUS) ×1 IMPLANT
ELECTRODE REM PT RTRN 9FT ADLT (ELECTROSURGICAL) ×1 IMPLANT
FACESHIELD WRAPAROUND (MASK) ×2 IMPLANT
FACESHIELD WRAPAROUND OR TEAM (MASK) ×2 IMPLANT
GLOVE BIOGEL PI IND STRL 8 (GLOVE) ×2 IMPLANT
GLOVE ECLIPSE 8.0 STRL XLNG CF (GLOVE) ×1 IMPLANT
GLOVE ORTHO TXT STRL SZ7.5 (GLOVE) ×2 IMPLANT
GOWN STRL REUS W/ TWL LRG LVL3 (GOWN DISPOSABLE) ×2 IMPLANT
GOWN STRL REUS W/ TWL XL LVL3 (GOWN DISPOSABLE) ×2 IMPLANT
GOWN STRL REUS W/TWL LRG LVL3 (GOWN DISPOSABLE) ×2
GOWN STRL REUS W/TWL XL LVL3 (GOWN DISPOSABLE) ×2
HANDPIECE INTERPULSE COAX TIP (DISPOSABLE) ×1
HEAD FEM STD 32X+5 STRL (Hips) IMPLANT
KIT BASIN OR (CUSTOM PROCEDURE TRAY) ×1 IMPLANT
KIT TURNOVER KIT B (KITS) ×1 IMPLANT
LINER ACET PNNCL PLUS4 NEUTRAL (Hips) IMPLANT
MANIFOLD NEPTUNE II (INSTRUMENTS) ×1 IMPLANT
NS IRRIG 1000ML POUR BTL (IV SOLUTION) ×1 IMPLANT
PACK TOTAL JOINT (CUSTOM PROCEDURE TRAY) ×1 IMPLANT
PAD ARMBOARD 7.5X6 YLW CONV (MISCELLANEOUS) ×1 IMPLANT
PINNACLE PLUS 4 NEUTRAL (Hips) ×1 IMPLANT
SET HNDPC FAN SPRY TIP SCT (DISPOSABLE) ×1 IMPLANT
STAPLER VISISTAT 35W (STAPLE) IMPLANT
STEM FEM ACTIS HIGH SZ3 (Stem) IMPLANT
STRIP CLOSURE SKIN 1/2X4 (GAUZE/BANDAGES/DRESSINGS) ×2 IMPLANT
SUT ETHIBOND NAB CT1 #1 30IN (SUTURE) ×1 IMPLANT
SUT MNCRL AB 4-0 PS2 18 (SUTURE) IMPLANT
SUT VIC AB 0 CT1 27 (SUTURE) ×1
SUT VIC AB 0 CT1 27XBRD ANBCTR (SUTURE) ×1 IMPLANT
SUT VIC AB 1 CT1 27 (SUTURE) ×1
SUT VIC AB 1 CT1 27XBRD ANBCTR (SUTURE) ×1 IMPLANT
SUT VIC AB 2-0 CT1 27 (SUTURE) ×1
SUT VIC AB 2-0 CT1 TAPERPNT 27 (SUTURE) ×1 IMPLANT
TOWEL GREEN STERILE (TOWEL DISPOSABLE) ×1 IMPLANT
TOWEL GREEN STERILE FF (TOWEL DISPOSABLE) ×1 IMPLANT
TRAY CATH 16FR W/PLASTIC CATH (SET/KITS/TRAYS/PACK) IMPLANT
TRAY FOLEY W/BAG SLVR 16FR (SET/KITS/TRAYS/PACK)
TRAY FOLEY W/BAG SLVR 16FR ST (SET/KITS/TRAYS/PACK) IMPLANT
WATER STERILE IRR 1000ML POUR (IV SOLUTION) ×2 IMPLANT

## 2022-06-19 NOTE — Anesthesia Procedure Notes (Signed)
Procedure Name: MAC Date/Time: 06/19/2022 10:30 AM  Performed by: Michele Rockers, CRNAPre-anesthesia Checklist: Patient identified, Emergency Drugs available, Suction available, Timeout performed and Patient being monitored Patient Re-evaluated:Patient Re-evaluated prior to induction Oxygen Delivery Method: Simple face mask

## 2022-06-19 NOTE — Transfer of Care (Signed)
Immediate Anesthesia Transfer of Care Note  Patient: Amanda Wilkins  Procedure(s) Performed: RIGHT TOTAL HIP ARTHROPLASTY ANTERIOR APPROACH (Right: Hip)  Patient Location: PACU  Anesthesia Type:MAC and Spinal  Level of Consciousness: awake, alert , oriented and patient cooperative  Airway & Oxygen Therapy: Patient Spontanous Breathing  Post-op Assessment: Report given to RN and Post -op Vital signs reviewed and stable  Post vital signs: Reviewed and stable  Last Vitals:  Vitals Value Taken Time  BP 123/64 06/19/22 1206  Temp    Pulse 72 06/19/22 1210  Resp 17 06/19/22 1210  SpO2 86 % 06/19/22 1210  Vitals shown include unvalidated device data.  Last Pain:  Vitals:   06/19/22 0857  TempSrc:   PainSc: 0-No pain         Complications: No notable events documented.

## 2022-06-19 NOTE — Interval H&P Note (Signed)
History and Physical Interval Note: The patient understands that she is here today for a right hip replacement to treat her severe right hip osteoarthritis.  There has been no acute change in her medical status.  The risks and benefits of surgery been explained in detail and informed consent is obtained.  The right operative hip has been marked.  06/19/2022 8:57 AM  Amanda Wilkins  has presented today for surgery, with the diagnosis of OSTEOARTHRITIS RIGHT HIP.  The various methods of treatment have been discussed with the patient and family. After consideration of risks, benefits and other options for treatment, the patient has consented to  Procedure(s): RIGHT TOTAL HIP ARTHROPLASTY ANTERIOR APPROACH (Right) as a surgical intervention.  The patient's history has been reviewed, patient examined, no change in status, stable for surgery.  I have reviewed the patient's chart and labs.  Questions were answered to the patient's satisfaction.     Mcarthur Rossetti

## 2022-06-19 NOTE — Evaluation (Signed)
Physical Therapy Evaluation Patient Details Name: Amanda Wilkins MRN: [id number] DOB: 1946/05/26 Today's Date: 06/19/2022  History of Present Illness  Pt is 76 yo female s/p R anterior THA on 06/19/22.  Pt with hx including but not limited to OA, afib, R TKA, vertigo, HLD, OSA  Clinical Impression  Pt is s/p THA resulting in the deficits listed below (see PT Problem List). At baseline, pt was independent with ADLs, IADLs, and community ambulation. She had recently been using cane due to hip pain.  Pt seen on day of surgery for evaluation.  She reported 10/10 pain from hip to knee in supine, did decrease some in sitting and when repositioned in bed.  Pt tearful with pain during transfers, but despite pain she moved with only min A. Pt able to ambulate short distance but became lightheaded and returned to supine.  Did have orthostatic hypotension.  Pt expected to progress well as orthostatic BP improves and pain is controlled.  Pt will benefit from skilled PT to increase their independence and safety with mobility to allow discharge to the venue listed below.         Recommendations for follow up therapy are one component of a multi-disciplinary discharge planning process, led by the attending physician.  Recommendations may be updated based on patient status, additional functional criteria and insurance authorization.  Follow Up Recommendations Follow physician's recommendations for discharge plan and follow up therapies      Assistance Recommended at Discharge Intermittent Supervision/Assistance  Patient can return home with the following  A little help with walking and/or transfers;A little help with bathing/dressing/bathroom;Assistance with cooking/housework;Help with stairs or ramp for entrance    Equipment Recommendations None recommended by PT  Recommendations for Other Services       Functional Status Assessment Patient has had a recent decline in their functional status and  demonstrates the ability to make significant improvements in function in a reasonable and predictable amount of time.     Precautions / Restrictions Precautions Precautions: Fall Restrictions Weight Bearing Restrictions: Yes RLE Weight Bearing: Weight bearing as tolerated      Mobility  Bed Mobility Overal bed mobility: Needs Assistance Bed Mobility: Supine to Sit, Sit to Supine     Supine to sit: Min assist, HOB elevated Sit to supine: Min assist, HOB elevated   General bed mobility comments: Min A for R LE; did provide gait belt for pt to use for R LE bed mobility    Transfers Overall transfer level: Needs assistance Equipment used: Rolling walker (2 wheels) Transfers: Sit to/from Stand Sit to Stand: Min assist, From elevated surface           General transfer comment: cues for R LE management    Ambulation/Gait Ambulation/Gait assistance: Min guard Gait Distance (Feet): 12 Feet Assistive device: Rolling walker (2 wheels) Gait Pattern/deviations: Step-to pattern, Decreased stride length Gait velocity: decreased     General Gait Details: Cues for sequencing; ambulated to door and back but became lightheaded and had to return to supine  Stairs            Wheelchair Mobility    Modified Rankin (Stroke Patients Only)       Balance Overall balance assessment: Needs assistance Sitting-balance support: No upper extremity supported Sitting balance-Leahy Scale: Good     Standing balance support: Bilateral upper extremity supported, Reliant on assistive device for balance Standing balance-Leahy Scale: Poor Standing balance comment: steady with RW  Pertinent Vitals/Pain Pain Assessment Pain Assessment: 0-10 Pain Score: 10-Worst pain ever Pain Location: R hip Pain Descriptors / Indicators: Sharp, Shooting, Aching Pain Intervention(s): Limited activity within patient's tolerance, Monitored during session,  Premedicated before session, Repositioned, Ice applied (Pain initially down to knee, improved with repositioning)    Home Living Family/patient expects to be discharged to:: Private residence Living Arrangements: Spouse/significant other Available Help at Discharge: Family;Available 24 hours/day Type of Home: House Home Access: Stairs to enter Entrance Stairs-Rails: Right Entrance Stairs-Number of Steps: 5   Home Layout: Two level;Able to live on main level with bedroom/bathroom Home Equipment: Rolling Walker (2 wheels);Cane - single point;BSC/3in1;Shower seat      Prior Function Prior Level of Function : Independent/Modified Independent;Driving             Mobility Comments: Could ambulate in community; using a cane past couple months due to hip pain ADLs Comments: Independent adls and iadls     Hand Dominance        Extremity/Trunk Assessment   Upper Extremity Assessment Upper Extremity Assessment: Overall WFL for tasks assessed    Lower Extremity Assessment Lower Extremity Assessment: LLE deficits/detail;RLE deficits/detail RLE Deficits / Details: Expected post op changes; ROM: WFL, MMT: ankle 5/5, knee 3/5, hip 2/5 LLE Deficits / Details: ROM WFL;  MMT 5/5    Cervical / Trunk Assessment Cervical / Trunk Assessment: Normal  Communication   Communication: No difficulties  Cognition Arousal/Alertness: Awake/alert Behavior During Therapy: WFL for tasks assessed/performed Overall Cognitive Status: Within Functional Limits for tasks assessed                                          General Comments General comments (skin integrity, edema, etc.): Pt became dizzy then lightheaded with gait then reports feeling hot; returned to supine with some improvement.  Checked and BP was 97/49 (pt reports typically 120's/70's); checked after 3 mins 124/46 symptoms improving    Exercises     Assessment/Plan    PT Assessment Patient needs continued PT  services  PT Problem List Decreased strength;Decreased mobility;Decreased range of motion;Decreased activity tolerance;Cardiopulmonary status limiting activity;Decreased balance;Decreased knowledge of use of DME;Pain       PT Treatment Interventions DME instruction;Therapeutic activities;Modalities;Gait training;Therapeutic exercise;Patient/family education;Stair training;Balance training;Functional mobility training    PT Goals (Current goals can be found in the Care Plan section)  Acute Rehab PT Goals Patient Stated Goal: return home; decrease pain PT Goal Formulation: With patient/family Time For Goal Achievement: 07/03/22 Potential to Achieve Goals: Good    Frequency 7X/week     Co-evaluation               AM-PAC PT "6 Clicks" Mobility  Outcome Measure Help needed turning from your back to your side while in a flat bed without using bedrails?: A Little Help needed moving from lying on your back to sitting on the side of a flat bed without using bedrails?: A Little Help needed moving to and from a bed to a chair (including a wheelchair)?: A Little Help needed standing up from a chair using your arms (e.g., wheelchair or bedside chair)?: A Little Help needed to walk in hospital room?: A Little Help needed climbing 3-5 steps with a railing? : A Lot 6 Click Score: 17    End of Session Equipment Utilized During Treatment: Gait belt Activity Tolerance: Patient limited by pain;Treatment limited secondary  to medical complications (Comment) (orthostatic) Patient left: in bed;with call bell/phone within reach;with family/visitor present Nurse Communication: Mobility status PT Visit Diagnosis: Other abnormalities of gait and mobility (R26.89);Muscle weakness (generalized) (M62.81)    Time: 2301-7209 PT Time Calculation (min) (ACUTE ONLY): 24 min   Charges:   PT Evaluation $PT Eval Moderate Complexity: 1 Mod PT Treatments $Therapeutic Activity: 8-22 mins        Abran Richard,  PT Acute Rehab [facility] [phone]   Karlton Lemon 06/19/2022, 4:21 PM

## 2022-06-19 NOTE — Anesthesia Procedure Notes (Signed)
Spinal  Patient location during procedure: OR Start time: 06/19/2022 10:37 AM End time: 06/19/2022 10:40 AM Reason for block: surgical anesthesia Staffing Performed: anesthesiologist  Anesthesiologist: Freddrick March, MD Performed by: Freddrick March, MD Authorized by: Freddrick March, MD   Preanesthetic Checklist Completed: patient identified, IV checked, risks and benefits discussed, surgical consent, monitors and equipment checked, pre-op evaluation and timeout performed Spinal Block Patient position: sitting Prep: DuraPrep and site prepped and draped Patient monitoring: cardiac monitor, continuous pulse ox and blood pressure Approach: midline Location: L3-4 Injection technique: single-shot Needle Needle type: Pencan  Needle gauge: 24 G Needle length: 9 cm Assessment Sensory level: T6 Events: CSF return Additional Notes Functioning IV was confirmed and monitors were applied. Sterile prep and drape, including hand hygiene and sterile gloves were used. The patient was positioned and the spine was prepped. The skin was anesthetized with lidocaine.  Free flow of clear CSF was obtained prior to injecting local anesthetic into the CSF.  The spinal needle aspirated freely following injection.  The needle was carefully withdrawn.  The patient tolerated the procedure well.

## 2022-06-19 NOTE — Op Note (Signed)
Operative Note  Date of operation: 06/19/2022 Preoperative diagnosis: Severe osteoarthritis right hip Postoperative diagnosis: Same  Procedure: Right direct anterior total hip arthroplasty  Implants: DePuy sector GRIPTION acetabular component size 50, size 32+4 polyethylene liner, size 3 Actis femoral component with high offset, 32+5 metal hip ball  Surgeon: Lind Guest. Ninfa Linden, MD Assistant: Benita Stabile, PA-C  Anesthesia: Spinal Antibiotics: IV vancomycin EBL: 751 cc Complications: None  Indications: The patient is an active 76 year old female with debilitating right hip pain.  Plain films and MRI confirm severe arthritis of the right hip.  At this point her right hip pain is daily and it is detrimentally affecting her mobility, her quality of life and her actives daily living to the point that she does wish to proceed with a hip replacement and would agree with this as well.  She has tried and failed all forms of conservative treatment.  She understands risk of acute blood loss anemia, nerve vessel injury, fracture, infection, DVT, implant failure, leg length differences and skin and soft tissue breakdown.  She understands all these are heightened given her morbid obesity.  Her goals are decreased pain, improve mobility and improve quality of life.  Procedure description: After informed consent was obtained and the appropriate right hip was marked, the patient brought to the operating room and set up on her stretcher where spinal anesthesia was obtained.  She was then laid in supine position on the stretcher and a Foley catheter was placed.  Traction boots were then applied to both her feet and she was placed supine on the Hana fracture table. A perineal post was in place and both legs were placed in skeletal traction devices but no traction applied.  Her right operative hip was then assessed radiographically and prepped with DuraPrep and sterile drapes.  Timeout was called and she was  identified as the correct patient and the correct right hip.  An incision was then made just posterior and inferior to the anterior superior iliac spine and carried slightly obliquely down the leg.  Dissection was carried down to the tensor fascia lata muscle and the tensor fascia was then divided longitudinally to proceed with direct intraoperative to the hip.  Circumflex vessels were identified and cauterized.  The joint capsule was identified and opened up in an L-type format finding a very large joint effusion.  Cobra retractors were placed around the medial lateral femoral neck and a femoral neck cut was made with an oscillating saw proximal to the lesser trochanter and completed with an osteotome.  A corkscrew guide was placed in the femoral head and the femoral head removed and sent entirety and found a wide area devoid of cartilage.  A bent Hohmann was then placed over the medial acetabular rim and remnants of the acetabular labrum and other debris were removed.  Reaming was then started from a size 43 reamer and reamed sequentially up to a size 49 reamer and stepwise increments with all reamers placed under regularization and the last reamer was also placed under direct fluoroscopy so we could obtain our depth of reaming, our anteversion and our inclination.  The real DePuy sector GRIPTION acetabular head at size 50 was then placed without difficulty.  A 32+4 polythene liner was then placed.  Attention was then turned to the femur.  With the leg externally rotated to 120 degrees, extended and adducted, the leg was brought down and under.  A Mueller retractor was placed medially and a Hohmann retractor was placed behind the  greater trochanter.  The lateral joint capsule was released and a box cutting osteotome was used to enter the femoral canal.  I then began broaching using the Actis broaching system from a size 0 going to a size 3.  Based off of her anatomy we then trialed a high offset femoral neck and a  32+1 trial hip ball.  The leg was brought back over and up and with traction and internal rotation was just in the pelvis and we are pleased with stability but we felt like we needed just a little bit more leg length.  We dislocated the hip and remove the trial implants.  The real Actis femoral component size 3 with high offset was placed followed by the real 32+5 metal hip ball.  This was again reduced into the pelvis.  We are pleased with range of motion, offset, leg length and stability assessed both mechanically and radiographically.  The soft tissue was then irrigated with normal saline solution.  The joint capsule was closed with interrupted #1 Ethibond suture followed by #1 Vicryl to close the tensor fascia.  0 Vicryl was used to close the deep tissue and 2-0 Vicryl is used to close subcutaneous tissue.  The skin was closed with staples.  An Aquacel dressing was applied.  The patient was taken off of the Hana table and taken to recovery in stable condition with all final counts being correct and no complications noted.  Of note Benita Stabile, PA-C did assist in the entire case from beginning to end and his assistance was medically necessary and crucial for helping soft tissue management and retraction, open guide implant placement and a layered closure of the wound.

## 2022-06-20 ENCOUNTER — Encounter (HOSPITAL_COMMUNITY): Payer: Self-pay | Admitting: Orthopaedic Surgery

## 2022-06-20 DIAGNOSIS — Z79899 Other long term (current) drug therapy: Secondary | ICD-10-CM | POA: Diagnosis not present

## 2022-06-20 DIAGNOSIS — M1611 Unilateral primary osteoarthritis, right hip: Secondary | ICD-10-CM | POA: Diagnosis not present

## 2022-06-20 DIAGNOSIS — Z96651 Presence of right artificial knee joint: Secondary | ICD-10-CM | POA: Diagnosis not present

## 2022-06-20 DIAGNOSIS — Z7901 Long term (current) use of anticoagulants: Secondary | ICD-10-CM | POA: Diagnosis not present

## 2022-06-20 DIAGNOSIS — I480 Paroxysmal atrial fibrillation: Secondary | ICD-10-CM | POA: Diagnosis not present

## 2022-06-20 DIAGNOSIS — I10 Essential (primary) hypertension: Secondary | ICD-10-CM | POA: Diagnosis not present

## 2022-06-20 LAB — BASIC METABOLIC PANEL
Anion gap: 11 (ref 5–15)
BUN: 9 mg/dL (ref 8–23)
CO2: 22 mmol/L (ref 22–32)
Calcium: 9.2 mg/dL (ref 8.9–10.3)
Chloride: 96 mmol/L — ABNORMAL LOW (ref 98–111)
Creatinine, Ser: 0.79 mg/dL (ref 0.44–1.00)
GFR, Estimated: >60 mL/min (ref >60)
Glucose, Bld: 125 mg/dL — ABNORMAL HIGH (ref 70–99)
Potassium: 4 mmol/L (ref 3.5–5.1)
Sodium: 129 mmol/L — ABNORMAL LOW (ref 135–145)

## 2022-06-20 LAB — CBC
HCT: 32.9 % — ABNORMAL LOW (ref 36.0–46.0)
Hemoglobin: 11.5 g/dL — ABNORMAL LOW (ref 12.0–15.0)
MCH: 32.9 pg (ref 26.0–34.0)
MCHC: 35 g/dL (ref 30.0–36.0)
MCV: 94 fL (ref 80.0–100.0)
Platelets: 140 10*3/uL — ABNORMAL LOW (ref 150–400)
RBC: 3.5 MIL/uL — ABNORMAL LOW (ref 3.87–5.11)
RDW: 12.2 % (ref 11.5–15.5)
WBC: 4.8 10*3/uL (ref 4.0–10.5)
nRBC: 0 % (ref 0.0–0.2)

## 2022-06-20 MED ORDER — HYDROCODONE-ACETAMINOPHEN 10-325 MG PO TABS: 1.0000 | ORAL_TABLET | Freq: Four times a day (QID) | ORAL | 0 refills | Status: DC | PRN

## 2022-06-20 NOTE — Progress Notes (Signed)
Subjective: 1 Day Post-Op Procedure(s) (LRB): RIGHT TOTAL HIP ARTHROPLASTY ANTERIOR APPROACH (Right) Patient reports pain as moderate.  Seems to be mobilizing well.  Objective: Vital signs in last 24 hours: Temp:  [97.4 F (36.3 C)-99.3 F (37.4 C)] 99.3 F (37.4 C) (09/06 0351) Pulse Rate:  [68-91] 84 (09/06 0351) Resp:  [10-19] 18 (09/06 0351) BP: (123-183)/(58-111) 146/74 (09/06 0351) SpO2:  [92 %-100 %] 99 % (09/06 0351) Weight:  [104.8 kg] 104.8 kg (09/05 0846)  Intake/Output from previous day: 09/05 0701 - 09/06 0700 In: 950 [I.V.:600; IV Piggyback:350] Out: 0786 [Urine:1650; Blood:100] Intake/Output this shift: No intake/output data recorded.  No results for input(s): "HGB" in the last 72 hours. No results for input(s): "WBC", "RBC", "HCT", "PLT" in the last 72 hours. No results for input(s): "NA", "K", "CL", "CO2", "BUN", "CREATININE", "GLUCOSE", "CALCIUM" in the last 72 hours. No results for input(s): "LABPT", "INR" in the last 72 hours.  Sensation intact distally Intact pulses distally Dorsiflexion/Plantar flexion intact Incision: scant drainage   Assessment/Plan: 1 Day Post-Op Procedure(s) (LRB): RIGHT TOTAL HIP ARTHROPLASTY ANTERIOR APPROACH (Right) Up with therapy Discharge home with home health this afternoon.      Mcarthur Rossetti 06/20/2022, 7:50 AM

## 2022-06-20 NOTE — Discharge Instructions (Signed)

## 2022-06-20 NOTE — Progress Notes (Signed)
Patient alert and oriented, mae's well, voiding adequate amount of urine, swallowing without difficulty, no c/o pain at time of discharge. Patient discharged home with family. Script and discharged instructions given to patient. Patient and family stated understanding of instructions given. Patient has an appointment with Dr. Ninfa Linden in 2 weeks

## 2022-06-20 NOTE — Progress Notes (Signed)
Physical Therapy Treatment Patient Details Name: Amanda Wilkins MRN: [id number] DOB: [birth year] Today's Date: 06/20/2022   History of Present Illness Pt is 76 yo female s/p R anterior THA on 06/19/22.  Pt with hx including but not limited to OA, afib, R TKA, vertigo, HLD, OSA    PT Comments    Pt progressing towards physical therapy goals. Was able to perform transfers and ambulation with gross min guard assist and RW for support. Pt reports feeling lightheaded throughout session, endorses similar feeling at home when taking "more pain meds than normal". BP 134/55 during this time. Pt wanting to end session back in the bed. Encouraged pt to sit up OOB a little more at home. Will continue to follow and progress as able per POC.    Recommendations for follow up therapy are one component of a multi-disciplinary discharge planning process, led by the attending physician.  Recommendations may be updated based on patient status, additional functional criteria and insurance authorization.  Follow Up Recommendations  Follow physician's recommendations for discharge plan and follow up therapies     Assistance Recommended at Discharge Intermittent Supervision/Assistance  Patient can return home with the following A little help with walking and/or transfers;A little help with bathing/dressing/bathroom;Assistance with cooking/housework;Help with stairs or ramp for entrance   Equipment Recommendations  None recommended by PT    Recommendations for Other Services       Precautions / Restrictions Precautions Precautions: Fall Restrictions Weight Bearing Restrictions: Yes RLE Weight Bearing: Weight bearing as tolerated     Mobility  Bed Mobility Overal bed mobility: Needs Assistance Bed Mobility: Supine to Sit, Sit to Supine     Supine to sit: HOB elevated, Supervision Sit to supine: Min assist   General bed mobility comments: When PT arrived pt was transitioning to EOB with gait belt  around foot to aide in movement. Min assist for LE management from EOB.    Transfers Overall transfer level: Needs assistance Equipment used: Rolling walker (2 wheels) Transfers: Sit to/from Stand Sit to Stand: Min assist, From elevated surface           General transfer comment: Cues for R LE positioning. Pt demonstrated proper hand placement on seated surface for safety.    Ambulation/Gait Ambulation/Gait assistance: Min guard Gait Distance (Feet): 100 Feet Assistive device: Rolling walker (2 wheels) Gait Pattern/deviations: Step-to pattern, Decreased stride length, Step-through pattern, Decreased dorsiflexion - right, Decreased weight shift to right Gait velocity: decreased     General Gait Details: Increased time to achieve step-through gait pattern. VC's throughout for closer walker proximity, increased heel strike, and forward gaze. Ambulation to/from stairs only due to lightheadedness.   Stairs Stairs: Yes Stairs assistance: Min guard Stair Management: One rail Right, Step to pattern, Backwards, Sideways Number of Stairs: 5 General stair comments: VC's for sequencing and general safety. Pt advanced forwards and descended sideways.   Wheelchair Mobility    Modified Rankin (Stroke Patients Only)       Balance Overall balance assessment: Needs assistance Sitting-balance support: No upper extremity supported Sitting balance-Leahy Scale: Good     Standing balance support: Bilateral upper extremity supported, Reliant on assistive device for balance Standing balance-Leahy Scale: Poor Standing balance comment: steady with RW                            Cognition Arousal/Alertness: Awake/alert Behavior During Therapy: WFL for tasks assessed/performed Overall Cognitive Status: Within Functional Limits for tasks assessed  Exercises Total Joint Exercises Ankle Circles/Pumps: 20 reps Quad Sets:  10 reps Short Arc Quad: 10 reps Heel Slides: 10 reps Hip ABduction/ADduction: 10 reps Long Arc Quad: 10 reps    General Comments        Pertinent Vitals/Pain Pain Assessment Pain Assessment: Faces Faces Pain Scale: Hurts little more Pain Location: R hip Pain Descriptors / Indicators: Aching, Operative site guarding, Grimacing, Tender, Crying Pain Intervention(s): Limited activity within patient's tolerance, Monitored during session, Repositioned    Home Living                          Prior Function            PT Goals (current goals can now be found in the care plan section) Acute Rehab PT Goals Patient Stated Goal: return home; decrease pain PT Goal Formulation: With patient/family Time For Goal Achievement: 07/03/22 Potential to Achieve Goals: Good Progress towards PT goals: Progressing toward goals    Frequency    7X/week      PT Plan Current plan remains appropriate    Co-evaluation              AM-PAC PT "6 Clicks" Mobility   Outcome Measure  Help needed turning from your back to your side while in a flat bed without using bedrails?: A Little Help needed moving from lying on your back to sitting on the side of a flat bed without using bedrails?: A Little Help needed moving to and from a bed to a chair (including a wheelchair)?: A Little Help needed standing up from a chair using your arms (e.g., wheelchair or bedside chair)?: A Little Help needed to walk in hospital room?: A Little Help needed climbing 3-5 steps with a railing? : A Little 6 Click Score: 18    End of Session Equipment Utilized During Treatment: Gait belt Activity Tolerance: Patient tolerated treatment well Patient left: in bed;with call bell/phone within reach;with family/visitor present Nurse Communication: Mobility status PT Visit Diagnosis: Other abnormalities of gait and mobility (R26.89);Muscle weakness (generalized) (M62.81)     Time: 6606-3016 PT Time  Calculation (min) (ACUTE ONLY): 24 min  Charges:  $Gait Training: 23-37 mins $Therapeutic Exercise: 8-22 mins                     Rolinda Roan, PT, DPT Acute Rehabilitation Services Secure Chat Preferred Office: [phone]    Thelma Comp 06/20/2022, 1:25 PM

## 2022-06-20 NOTE — Anesthesia Postprocedure Evaluation (Signed)
Anesthesia Post Note  Patient: Calene Paradiso  Procedure(s) Performed: RIGHT TOTAL HIP ARTHROPLASTY ANTERIOR APPROACH (Right: Hip)     Patient location during evaluation: PACU Anesthesia Type: Spinal Level of consciousness: oriented and awake and alert Pain management: pain level controlled Vital Signs Assessment: post-procedure vital signs reviewed and stable Respiratory status: spontaneous breathing, respiratory function stable and patient connected to nasal cannula oxygen Cardiovascular status: blood pressure returned to baseline and stable Postop Assessment: no headache, no backache and no apparent nausea or vomiting Anesthetic complications: no   No notable events documented.  Last Vitals:  Vitals:   06/19/22 2312 06/20/22 0351  BP: (!) 136/58 (!) 146/74  Pulse: 83 84  Resp: 18 18  Temp: 36.8 C 37.4 C  SpO2: 96% 99%    Last Pain:  Vitals:   06/20/22 0443  TempSrc:   PainSc: 3    Pain Goal: Patients Stated Pain Goal: 3 (06/20/22 0358)                 Haywood Lasso L Emonee Winkowski

## 2022-06-20 NOTE — Discharge Summary (Signed)
Patient ID: Amanda Wilkins MRN: [id number] DOB/AGE: [birth year] 76 y.o.  Admit date: 06/19/2022 Discharge date: 06/20/2022  Admission Diagnoses:  Principal Problem:   Unilateral primary osteoarthritis, right hip Active Problems:   Status post total replacement of right hip   Osteoarthritis of hip   Discharge Diagnoses:  Same  Past Medical History:  Diagnosis Date   Acute bronchitis 09/11/2016   11/17 refractory   Acute cystitis without hematuria 05/17/2015   Acute kidney injury [facility])    Labs today   Acute right ankle pain 09/09/2018   Anticoagulant long-term use    Xarelto   Axillary pain    Bronchitis    Cellulitis    Cholecystitis    Chronic interstitial nephritis    CONJUNCTIVITIS, ACUTE 10/18/2010   Qualifier: Diagnosis of  By: Plotnikov MD, Evie Lacks    D-dimer, elevated    Depression    Eye inflammation    GERD (gastroesophageal reflux disease)    History of interstitial nephritis 2016   chronic   History of kidney stones    has a small one found on xray   History of nuclear stress test 12/16/2014   Intermediate risk nuclear study w/ medium size moderate severity reversible defect in the basal and mid inferolateral and inferior wall (per dr cardiology note , dr [name] did not think this was consistent with ischemia)/  normal LV function and wall motion, ef 75%   History of septic shock 01/22/2015   in setting Group A Strep Cellulits erysipelas/chest wall induration with streptoccocus basterium-- Severe sepsis, DIC, Acute respiratory failure with pulmonary edema, Acute Kidney failure with chronic interstitial nephritis   Hypertension    Hyponatremia    Migraines    on Zoloft for migraines   Mild carotid artery disease ([place])    per duplex 08-30-2017 bilateral ICA 1-39%   OA (osteoarthritis) rheumotologist-  dr [name]   both knees,  shoulders, ankles   OSA on CPAP     moderate obstructive sleep apnea with an AHI of 23.4/h and oxygen desaturations  as low as 84%.  Now on CPAP at 12 cm H2O.   PAF (paroxysmal atrial fibrillation) [facility]) 2009   cardiologist-- dr [name]   Paroxysmal atrial flutter [facility])    a. dx 11/2017.   PSVT (paroxysmal supraventricular tachycardia) (H[facility])    RA (rheumatoid arthritis) ([place])    Wears contact lenses     Surgeries: Procedure(s): RIGHT TOTAL HIP ARTHROPLASTY ANTERIOR APPROACH on 06/19/2022   Consultants:   Discharged Condition: Improved  Hospital Course: Kimbly Eanes is an 76 y.o. female who was admitted 06/19/2022 for operative treatment ofUnilateral primary osteoarthritis, right hip. Patient has severe unremitting pain that affects sleep, daily activities, and work/hobbies. After pre-op clearance the patient was taken to the operating room on 06/19/2022 and underwent  Procedure(s): RIGHT TOTAL HIP ARTHROPLASTY ANTERIOR APPROACH.    Patient was given perioperative antibiotics:  Anti-infectives (From admission, onward)    Start     Dose/Rate Route Frequency Ordered Stop   06/20/22 1000  hydroxychloroquine (PLAQUENIL) tablet 400 mg        400 mg Oral Every morning 06/19/22 1423     06/19/22 0900  vancomycin (VANCOCIN) IVPB 1000 mg/200 mL premix        1,000 mg 200 mL/hr over 60 Minutes Intravenous On call to O.R. 06/19/22 0846 06/19/22 1100        Patient was given sequential compression devices, early ambulation, and chemoprophylaxis to prevent DVT.  Patient benefited  maximally from hospital stay and there were no complications.    Recent vital signs: Patient Vitals for the past 24 hrs:  BP Temp Temp src Pulse Resp SpO2  06/20/22 1227 (!) 134/55 -- -- 71 -- 99 %  06/20/22 0800 (!) 119/59 99.6 F (37.6 C) Oral 90 17 97 %  06/20/22 0351 (!) 146/74 99.3 F (37.4 C) Oral 84 18 99 %  06/19/22 2312 (!) 136/58 98.3 F (36.8 C) Oral 83 18 96 %  06/19/22 1929 (!) 145/58 98.6 F (37 C) Oral 78 18 100 %  06/19/22 1716 (!) 153/71 97.7 F (36.5 C) -- 80 18 100 %     Recent laboratory  studies:  Recent Labs    06/20/22 0950  WBC 4.8  HGB 11.5*  HCT 32.9*  PLT 140*  NA 129*  K 4.0  CL 96*  CO2 22  BUN 9  CREATININE 0.79  GLUCOSE 125*  CALCIUM 9.2     Discharge Medications:   Allergies as of 06/20/2022       Reactions   Epinephrine Base Other (See Comments)   Seriously increases heart rate   Aspirin Other (See Comments)   Can take the coated 325 mg. Plain asa 325 mg speeds up the heart.   Benadryl [diphenhydramine]    Increased heart rate   Covid-19 Ad26 Vaccine(janssen) Hives    hives 6 hrs after her COVID 19 2nd booster - resolved   Fish Allergy Hives   Was imitation crab meat and broke out in hives, had skin testing for lobster and showed allergic   Hylan G-f 20 Other (See Comments)   Very painful (knee injection)   Penicillins Other (See Comments)   Pt previously has been told not to take because of family history of reactions (water blisters). She took amoxicillin in 2012-2013 with no reaction Pt received ancef on 11-16-2021 without issue   Tape Other (See Comments)   Blisters from adhesive tape   Wound Dressing Adhesive Other (See Comments)   Blisters from adhesive tape        Medication List     TAKE these medications    acetaminophen 500 MG tablet Commonly known as: TYLENOL Take 1,000 mg by mouth daily as needed for moderate pain or mild pain (pain).   Align 4 MG Caps Take 4 mg by mouth daily.   ALPRAZolam 0.25 MG tablet Commonly known as: XANAX Take 0.25 mg by mouth daily as needed for anxiety.   amLODipine 2.5 MG tablet Commonly known as: NORVASC Take 0.5 tablets (1.25 mg total) by mouth daily.   cholecalciferol 25 MCG (1000 UNIT) tablet Commonly known as: VITAMIN D3 Take 1,000 Units by mouth daily.   Eliquis 5 MG Tabs tablet Generic drug: apixaban TAKE 1 TABLET(5 MG) BY MOUTH TWICE DAILY   EPINEPHrine 0.3 mg/0.3 mL Soaj injection Commonly known as: EPI-PEN Inject 0.3 mg into the muscle as needed for anaphylaxis. As  needed for life-threatening allergic reactions   flecainide 50 MG tablet Commonly known as: TAMBOCOR Take 1.5 tablets (75 mg total) by mouth 2 (two) times daily.   gabapentin 300 MG capsule Commonly known as: NEURONTIN Take 1 capsule (300 mg total) by mouth 3 (three) times daily.   HYDROcodone-acetaminophen 10-325 MG tablet Commonly known as: NORCO Take 1-2 tablets by mouth every 6 (six) hours as needed. What changed:  how much to take when to take this reasons to take this   hydroxychloroquine 200 MG tablet Commonly known as: PLAQUENIL Take  400 mg by mouth every morning.   metoprolol tartrate 25 MG tablet Commonly known as: LOPRESSOR Take 1 tablet (25 mg total) by mouth 2 (two) times daily.   pantoprazole 40 MG tablet Commonly known as: PROTONIX Take 1 tablet (40 mg total) by mouth daily. What changed:  when to take this reasons to take this   rosuvastatin 5 MG tablet Commonly known as: CRESTOR Take 1 tablet (5 mg total) by mouth daily.   sertraline 50 MG tablet Commonly known as: ZOLOFT TAKE 1 TABLET BY MOUTH DAILY               Durable Medical Equipment  (From admission, onward)           Start     Ordered   06/19/22 1424  DME 3 n 1  Once        06/19/22 1423   06/19/22 1424  DME Walker rolling  Once       Question Answer Comment  Walker: With 5 Inch Wheels   Patient needs a walker to treat with the following condition Status post total replacement of right hip      06/19/22 1423            Diagnostic Studies: DG Pelvis Portable  Result Date: 06/19/2022 CLINICAL DATA:  Status post total right hip arthroplasty. EXAM: PORTABLE PELVIS 1-2 VIEWS COMPARISON:  Pelvis and right hip radiographs 06/06/2021 FINDINGS: Status post total right hip arthroplasty. No perihardware lucency is seen to indicate hardware failure or loosening. Postsurgical changes of lateral right hip surgical skin staples and mild subcutaneous air. Mild superior left  femoroacetabular joint space narrowing. IMPRESSION: Interval total right hip arthroplasty without evidence of hardware failure. Electronically Signed   By: Yvonne Kendall M.D.   On: 06/19/2022 13:08   DG HIP UNILAT WITH PELVIS 1V RIGHT  Result Date: 06/19/2022 CLINICAL DATA:  Total right hip arthroplasty intraoperative fluoroscopy. EXAM: DG HIP (WITH OR WITHOUT PELVIS) 1V RIGHT COMPARISON:  Right hip radiographs 06/06/2021 FINDINGS: Images were performed intraoperatively without the presence of a radiologist. Interval total right hip arthroplasty. No hardware complication is seen. Total fluoroscopy images: 3 Total fluoroscopy time: 18 seconds Total dose: Radiation Exposure Index (as provided by the fluoroscopic device): 2.395 mGy air Kerma Please see intraoperative findings for further detail. IMPRESSION: Intraoperative fluoroscopy for total right hip arthroplasty. Electronically Signed   By: Yvonne Kendall M.D.   On: 06/19/2022 12:02   DG C-Arm 1-60 Min-No Report  Result Date: 06/19/2022 Fluoroscopy was utilized by the requesting physician.  No radiographic interpretation.    Disposition: Discharge disposition: 01-Home or [place], [place] Follow up.   Specialty: [place] Why: The home health agency will contact you for the first home visit Contact information: [place] [address] [phone]                  Signed: Mcarthur Rossetti 06/20/2022, 3:47 PM

## 2022-06-20 NOTE — Progress Notes (Signed)
Physical Therapy Treatment Patient Details Name: Amanda Wilkins MRN: [id number] DOB: [birth year] Today's Date: 06/20/2022   History of Present Illness Pt is 76 yo female s/p R anterior THA on 06/19/22.  Pt with hx including but not limited to OA, afib, R TKA, vertigo, HLD, OSA    PT Comments    Pt progressing towards physical therapy goals. Was able to perform transfers and ambulation with gross min assist and RW for support. Reviewed HEP and pt/husband were educated on car transfer and appropriate activity progression. Will see for a second session for further functional mobility training prior to d/c.     Recommendations for follow up therapy are one component of a multi-disciplinary discharge planning process, led by the attending physician.  Recommendations may be updated based on patient status, additional functional criteria and insurance authorization.  Follow Up Recommendations  Follow physician's recommendations for discharge plan and follow up therapies     Assistance Recommended at Discharge Intermittent Supervision/Assistance  Patient can return home with the following A little help with walking and/or transfers;A little help with bathing/dressing/bathroom;Assistance with cooking/housework;Help with stairs or ramp for entrance   Equipment Recommendations  None recommended by PT    Recommendations for Other Services       Precautions / Restrictions Precautions Precautions: Fall Restrictions Weight Bearing Restrictions: Yes RLE Weight Bearing: Weight bearing as tolerated     Mobility  Bed Mobility Overal bed mobility: Needs Assistance Bed Mobility: Supine to Sit, Sit to Supine     Supine to sit: Min assist, HOB elevated Sit to supine: Min assist   General bed mobility comments: Min assist for LE management to/from EOB. Pt utilizing techniques from HEP to assist with bed mobility.    Transfers Overall transfer level: Needs assistance Equipment used: Rolling  walker (2 wheels) Transfers: Sit to/from Stand Sit to Stand: Min assist, From elevated surface           General transfer comment: Cues for R LE positioning. Pt demonstrated proper hand placement on seated surface for safety.    Ambulation/Gait Ambulation/Gait assistance: Min guard Gait Distance (Feet): 200 Feet Assistive device: Rolling walker (2 wheels) Gait Pattern/deviations: Step-to pattern, Decreased stride length, Step-through pattern, Decreased dorsiflexion - right, Decreased weight shift to right Gait velocity: decreased     General Gait Details: Increased time to achieve step-through gait pattern. VC's throughout for closer walker proximity, increased heel strike, and forward gaze. Pt reports after ~3 minutes of being up to ambulate, her hip pain improved.   Stairs             Wheelchair Mobility    Modified Rankin (Stroke Patients Only)       Balance Overall balance assessment: Needs assistance Sitting-balance support: No upper extremity supported Sitting balance-Leahy Scale: Good     Standing balance support: Bilateral upper extremity supported, Reliant on assistive device for balance Standing balance-Leahy Scale: Poor Standing balance comment: steady with RW                            Cognition Arousal/Alertness: Awake/alert Behavior During Therapy: WFL for tasks assessed/performed Overall Cognitive Status: Within Functional Limits for tasks assessed                                          Exercises Total Joint Exercises Ankle Circles/Pumps: 20 reps Quad  Sets: 10 reps Short Arc Quad: 10 reps Heel Slides: 10 reps Hip ABduction/ADduction: 10 reps Long Arc Quad: 10 reps    General Comments        Pertinent Vitals/Pain Pain Assessment Pain Assessment: Faces Faces Pain Scale: Hurts whole lot Pain Location: R hip Pain Descriptors / Indicators: Aching, Operative site guarding, Grimacing, Tender, Crying Pain  Intervention(s): Limited activity within patient's tolerance, Monitored during session, Repositioned    Home Living                          Prior Function            PT Goals (current goals can now be found in the care plan section) Acute Rehab PT Goals Patient Stated Goal: return home; decrease pain PT Goal Formulation: With patient/family Time For Goal Achievement: 07/03/22 Potential to Achieve Goals: Good Progress towards PT goals: Progressing toward goals    Frequency    7X/week      PT Plan Current plan remains appropriate    Co-evaluation              AM-PAC PT "6 Clicks" Mobility   Outcome Measure  Help needed turning from your back to your side while in a flat bed without using bedrails?: A Little Help needed moving from lying on your back to sitting on the side of a flat bed without using bedrails?: A Little Help needed moving to and from a bed to a chair (including a wheelchair)?: A Little Help needed standing up from a chair using your arms (e.g., wheelchair or bedside chair)?: A Little Help needed to walk in hospital room?: A Little Help needed climbing 3-5 steps with a railing? : A Little 6 Click Score: 18    End of Session Equipment Utilized During Treatment: Gait belt Activity Tolerance: Patient tolerated treatment well Patient left: in bed;with call bell/phone within reach;with family/visitor present Nurse Communication: Mobility status PT Visit Diagnosis: Other abnormalities of gait and mobility (R26.89);Muscle weakness (generalized) (M62.81)     Time: 5638-9373 PT Time Calculation (min) (ACUTE ONLY): 43 min  Charges:  $Gait Training: 23-37 mins $Therapeutic Exercise: 8-22 mins                     Rolinda Roan, PT, DPT Acute Rehabilitation Services Secure Chat Preferred Office: [phone]    Thelma Comp 06/20/2022, 10:22 AM

## 2022-06-21 ENCOUNTER — Ambulatory Visit: Payer: Medicare Other | Admitting: Psychology

## 2022-06-22 ENCOUNTER — Telehealth: Payer: Self-pay | Admitting: *Deleted

## 2022-06-22 DIAGNOSIS — I471 Supraventricular tachycardia: Secondary | ICD-10-CM | POA: Diagnosis not present

## 2022-06-22 DIAGNOSIS — N200 Calculus of kidney: Secondary | ICD-10-CM | POA: Diagnosis not present

## 2022-06-22 DIAGNOSIS — G43909 Migraine, unspecified, not intractable, without status migrainosus: Secondary | ICD-10-CM | POA: Diagnosis not present

## 2022-06-22 DIAGNOSIS — I4892 Unspecified atrial flutter: Secondary | ICD-10-CM | POA: Diagnosis not present

## 2022-06-22 DIAGNOSIS — F32A Depression, unspecified: Secondary | ICD-10-CM | POA: Diagnosis not present

## 2022-06-22 DIAGNOSIS — Z87440 Personal history of urinary (tract) infections: Secondary | ICD-10-CM | POA: Diagnosis not present

## 2022-06-22 DIAGNOSIS — E871 Hypo-osmolality and hyponatremia: Secondary | ICD-10-CM | POA: Diagnosis not present

## 2022-06-22 DIAGNOSIS — I480 Paroxysmal atrial fibrillation: Secondary | ICD-10-CM | POA: Diagnosis not present

## 2022-06-22 DIAGNOSIS — Z7901 Long term (current) use of anticoagulants: Secondary | ICD-10-CM | POA: Diagnosis not present

## 2022-06-22 DIAGNOSIS — Z96641 Presence of right artificial hip joint: Secondary | ICD-10-CM | POA: Diagnosis not present

## 2022-06-22 DIAGNOSIS — M069 Rheumatoid arthritis, unspecified: Secondary | ICD-10-CM | POA: Diagnosis not present

## 2022-06-22 DIAGNOSIS — I10 Essential (primary) hypertension: Secondary | ICD-10-CM | POA: Diagnosis not present

## 2022-06-22 DIAGNOSIS — M1990 Unspecified osteoarthritis, unspecified site: Secondary | ICD-10-CM | POA: Diagnosis not present

## 2022-06-22 DIAGNOSIS — Z9181 History of falling: Secondary | ICD-10-CM | POA: Diagnosis not present

## 2022-06-22 DIAGNOSIS — Z471 Aftercare following joint replacement surgery: Secondary | ICD-10-CM | POA: Diagnosis not present

## 2022-06-22 DIAGNOSIS — K219 Gastro-esophageal reflux disease without esophagitis: Secondary | ICD-10-CM | POA: Diagnosis not present

## 2022-06-22 DIAGNOSIS — B0229 Other postherpetic nervous system involvement: Secondary | ICD-10-CM | POA: Diagnosis not present

## 2022-06-22 DIAGNOSIS — M65312 Trigger thumb, left thumb: Secondary | ICD-10-CM | POA: Diagnosis not present

## 2022-06-22 DIAGNOSIS — D696 Thrombocytopenia, unspecified: Secondary | ICD-10-CM | POA: Diagnosis not present

## 2022-06-22 DIAGNOSIS — M65311 Trigger thumb, right thumb: Secondary | ICD-10-CM | POA: Diagnosis not present

## 2022-06-22 NOTE — Telephone Encounter (Signed)
Ortho bundle D/C call completed. 

## 2022-06-25 ENCOUNTER — Telehealth: Payer: Self-pay | Admitting: *Deleted

## 2022-06-25 DIAGNOSIS — M65312 Trigger thumb, left thumb: Secondary | ICD-10-CM | POA: Diagnosis not present

## 2022-06-25 DIAGNOSIS — M1990 Unspecified osteoarthritis, unspecified site: Secondary | ICD-10-CM | POA: Diagnosis not present

## 2022-06-25 DIAGNOSIS — I10 Essential (primary) hypertension: Secondary | ICD-10-CM | POA: Diagnosis not present

## 2022-06-25 DIAGNOSIS — I480 Paroxysmal atrial fibrillation: Secondary | ICD-10-CM | POA: Diagnosis not present

## 2022-06-25 DIAGNOSIS — I4892 Unspecified atrial flutter: Secondary | ICD-10-CM | POA: Diagnosis not present

## 2022-06-25 DIAGNOSIS — Z471 Aftercare following joint replacement surgery: Secondary | ICD-10-CM | POA: Diagnosis not present

## 2022-06-25 NOTE — Telephone Encounter (Signed)
Ortho bundle 7 day call completed. 

## 2022-06-26 DIAGNOSIS — M1990 Unspecified osteoarthritis, unspecified site: Secondary | ICD-10-CM | POA: Diagnosis not present

## 2022-06-26 DIAGNOSIS — I10 Essential (primary) hypertension: Secondary | ICD-10-CM | POA: Diagnosis not present

## 2022-06-26 DIAGNOSIS — M65312 Trigger thumb, left thumb: Secondary | ICD-10-CM | POA: Diagnosis not present

## 2022-06-26 DIAGNOSIS — I480 Paroxysmal atrial fibrillation: Secondary | ICD-10-CM | POA: Diagnosis not present

## 2022-06-26 DIAGNOSIS — Z471 Aftercare following joint replacement surgery: Secondary | ICD-10-CM | POA: Diagnosis not present

## 2022-06-26 DIAGNOSIS — I4892 Unspecified atrial flutter: Secondary | ICD-10-CM | POA: Diagnosis not present

## 2022-06-28 ENCOUNTER — Ambulatory Visit: Payer: Medicare Other | Admitting: Psychology

## 2022-06-28 DIAGNOSIS — I10 Essential (primary) hypertension: Secondary | ICD-10-CM | POA: Diagnosis not present

## 2022-06-28 DIAGNOSIS — Z471 Aftercare following joint replacement surgery: Secondary | ICD-10-CM | POA: Diagnosis not present

## 2022-06-28 DIAGNOSIS — M65312 Trigger thumb, left thumb: Secondary | ICD-10-CM | POA: Diagnosis not present

## 2022-06-28 DIAGNOSIS — I480 Paroxysmal atrial fibrillation: Secondary | ICD-10-CM | POA: Diagnosis not present

## 2022-06-28 DIAGNOSIS — I4892 Unspecified atrial flutter: Secondary | ICD-10-CM | POA: Diagnosis not present

## 2022-06-28 DIAGNOSIS — M1990 Unspecified osteoarthritis, unspecified site: Secondary | ICD-10-CM | POA: Diagnosis not present

## 2022-06-30 DIAGNOSIS — I4892 Unspecified atrial flutter: Secondary | ICD-10-CM | POA: Diagnosis not present

## 2022-06-30 DIAGNOSIS — Z471 Aftercare following joint replacement surgery: Secondary | ICD-10-CM | POA: Diagnosis not present

## 2022-06-30 DIAGNOSIS — M1990 Unspecified osteoarthritis, unspecified site: Secondary | ICD-10-CM | POA: Diagnosis not present

## 2022-06-30 DIAGNOSIS — I480 Paroxysmal atrial fibrillation: Secondary | ICD-10-CM | POA: Diagnosis not present

## 2022-06-30 DIAGNOSIS — M65312 Trigger thumb, left thumb: Secondary | ICD-10-CM | POA: Diagnosis not present

## 2022-06-30 DIAGNOSIS — I10 Essential (primary) hypertension: Secondary | ICD-10-CM | POA: Diagnosis not present

## 2022-07-02 ENCOUNTER — Ambulatory Visit (INDEPENDENT_AMBULATORY_CARE_PROVIDER_SITE_OTHER): Payer: Medicare Other | Admitting: Orthopaedic Surgery

## 2022-07-02 ENCOUNTER — Telehealth: Payer: Self-pay | Admitting: *Deleted

## 2022-07-02 ENCOUNTER — Encounter: Payer: Self-pay | Admitting: Orthopaedic Surgery

## 2022-07-02 DIAGNOSIS — Z96641 Presence of right artificial hip joint: Secondary | ICD-10-CM

## 2022-07-02 MED ORDER — HYDROCODONE-ACETAMINOPHEN 10-325 MG PO TABS: 1.0000 | ORAL_TABLET | Freq: Four times a day (QID) | ORAL | 0 refills | Status: DC | PRN

## 2022-07-02 NOTE — Progress Notes (Signed)
The patient comes in today for her first postoperative visit status post a right total hip arthroplasty.  She does have some bruising around her right knee and and has a history of a knee replacement.  She is on Eliquis.  She has been taking hydrocodone but just in the morning.  She has notes from home health therapy and it states she is making good progress with her mobility.  She is only on a cane today.  Her Eliquis is a chronic medication.  She does need a refill of hydrocodone.  Right hip incision looks good.  There is some swelling and bruising to be expected especially at her knee.  Her staples have been removed and Steri-Strips applied.  She will continue to increase her mobility as comfort allows.  I will refill her hydrocodone and then see her back in 4 weeks to see how she is doing from a mobility standpoint but no x-rays are needed.

## 2022-07-02 NOTE — Telephone Encounter (Signed)
Ortho bundle 14 day in office meeting completed. °

## 2022-07-04 DIAGNOSIS — M65312 Trigger thumb, left thumb: Secondary | ICD-10-CM | POA: Diagnosis not present

## 2022-07-04 DIAGNOSIS — Z471 Aftercare following joint replacement surgery: Secondary | ICD-10-CM | POA: Diagnosis not present

## 2022-07-04 DIAGNOSIS — I480 Paroxysmal atrial fibrillation: Secondary | ICD-10-CM | POA: Diagnosis not present

## 2022-07-04 DIAGNOSIS — I4892 Unspecified atrial flutter: Secondary | ICD-10-CM | POA: Diagnosis not present

## 2022-07-04 DIAGNOSIS — I10 Essential (primary) hypertension: Secondary | ICD-10-CM | POA: Diagnosis not present

## 2022-07-04 DIAGNOSIS — M1990 Unspecified osteoarthritis, unspecified site: Secondary | ICD-10-CM | POA: Diagnosis not present

## 2022-07-05 ENCOUNTER — Ambulatory Visit: Payer: Medicare Other | Admitting: Psychology

## 2022-07-05 ENCOUNTER — Telehealth: Payer: Self-pay | Admitting: *Deleted

## 2022-07-05 DIAGNOSIS — M1991 Primary osteoarthritis, unspecified site: Secondary | ICD-10-CM | POA: Diagnosis not present

## 2022-07-05 DIAGNOSIS — Z79899 Other long term (current) drug therapy: Secondary | ICD-10-CM | POA: Diagnosis not present

## 2022-07-05 DIAGNOSIS — M5136 Other intervertebral disc degeneration, lumbar region: Secondary | ICD-10-CM | POA: Diagnosis not present

## 2022-07-05 DIAGNOSIS — Z6837 Body mass index (BMI) 37.0-37.9, adult: Secondary | ICD-10-CM | POA: Diagnosis not present

## 2022-07-05 DIAGNOSIS — L308 Other specified dermatitis: Secondary | ICD-10-CM | POA: Diagnosis not present

## 2022-07-05 DIAGNOSIS — D485 Neoplasm of uncertain behavior of skin: Secondary | ICD-10-CM | POA: Diagnosis not present

## 2022-07-05 DIAGNOSIS — M545 Low back pain, unspecified: Secondary | ICD-10-CM | POA: Diagnosis not present

## 2022-07-05 DIAGNOSIS — E669 Obesity, unspecified: Secondary | ICD-10-CM | POA: Diagnosis not present

## 2022-07-05 DIAGNOSIS — M0609 Rheumatoid arthritis without rheumatoid factor, multiple sites: Secondary | ICD-10-CM | POA: Diagnosis not present

## 2022-07-05 NOTE — Telephone Encounter (Signed)
Patient called asking about back pain that has not improved since her Right hip replacement. She saw you Monday for her 2 week post op and you saw her husband yesterday, who was referred for therapy for his back. She wanted to know if she also could get a referral for therapy for her back? Would this be ok? If so, I'll send order.

## 2022-07-06 NOTE — Telephone Encounter (Signed)
Order for PT at location of her choice: [facility] per Dr. Ninfa Linden. Faxed on 07/05/22.

## 2022-07-07 ENCOUNTER — Emergency Department (HOSPITAL_COMMUNITY): Payer: Medicare Other

## 2022-07-07 ENCOUNTER — Encounter (HOSPITAL_COMMUNITY): Payer: Self-pay | Admitting: Emergency Medicine

## 2022-07-07 ENCOUNTER — Emergency Department (HOSPITAL_COMMUNITY)
Admission: EM | Admit: 2022-07-07 | Discharge: 2022-07-07 | Disposition: A | Discharge status: Home / Self Care | Payer: Medicare Other | Attending: Emergency Medicine | Admitting: Emergency Medicine

## 2022-07-07 ENCOUNTER — Telehealth: Payer: Self-pay | Admitting: Student in an Organized Health Care Education/Training Program

## 2022-07-07 ENCOUNTER — Other Ambulatory Visit: Payer: Self-pay

## 2022-07-07 DIAGNOSIS — Z7901 Long term (current) use of anticoagulants: Secondary | ICD-10-CM | POA: Insufficient documentation

## 2022-07-07 DIAGNOSIS — R002 Palpitations: Secondary | ICD-10-CM | POA: Insufficient documentation

## 2022-07-07 DIAGNOSIS — Z79899 Other long term (current) drug therapy: Secondary | ICD-10-CM | POA: Diagnosis not present

## 2022-07-07 DIAGNOSIS — R000 Tachycardia, unspecified: Secondary | ICD-10-CM | POA: Diagnosis not present

## 2022-07-07 DIAGNOSIS — I4891 Unspecified atrial fibrillation: Secondary | ICD-10-CM | POA: Diagnosis not present

## 2022-07-07 LAB — BASIC METABOLIC PANEL
Anion gap: 10 (ref 5–15)
BUN: 19 mg/dL (ref 8–23)
CO2: 22 mmol/L (ref 22–32)
Calcium: 10 mg/dL (ref 8.9–10.3)
Chloride: 98 mmol/L (ref 98–111)
Creatinine, Ser: 0.78 mg/dL (ref 0.44–1.00)
GFR, Estimated: >60 mL/min (ref >60)
Glucose, Bld: 118 mg/dL — ABNORMAL HIGH (ref 70–99)
Potassium: 4.6 mmol/L (ref 3.5–5.1)
Sodium: 130 mmol/L — ABNORMAL LOW (ref 135–145)

## 2022-07-07 LAB — TROPONIN I (HIGH SENSITIVITY)
Troponin I (High Sensitivity): 7 ng/L (ref <18)
Troponin I (High Sensitivity): 7 ng/L (ref <18)

## 2022-07-07 LAB — CBC
HCT: 31.7 % — ABNORMAL LOW (ref 36.0–46.0)
Hemoglobin: 11.1 g/dL — ABNORMAL LOW (ref 12.0–15.0)
MCH: 33.3 pg (ref 26.0–34.0)
MCHC: 35 g/dL (ref 30.0–36.0)
MCV: 95.2 fL (ref 80.0–100.0)
Platelets: 249 10*3/uL (ref 150–400)
RBC: 3.33 MIL/uL — ABNORMAL LOW (ref 3.87–5.11)
RDW: 12.9 % (ref 11.5–15.5)
WBC: 7.3 10*3/uL (ref 4.0–10.5)
nRBC: 0 % (ref 0.0–0.2)

## 2022-07-07 LAB — PROTIME-INR
INR: 1.1 (ref 0.8–1.2)
Prothrombin Time: 13.9 seconds (ref 11.4–15.2)

## 2022-07-07 NOTE — ED Triage Notes (Signed)
Patient reports palpitations/Afib with HR= 135/min at home this evening , no chest pain or SOB . Dr. [name] is her cardiologist .

## 2022-07-07 NOTE — ED Provider Notes (Signed)
[facility] EMERGENCY DEPARTMENT Provider Note   CSN: [id number] Arrival date & time: 07/07/22  0154     History  Chief Complaint  Patient presents with   Palpitations / Afib    Amanda Wilkins is a 76 y.o. female.  76 year old female presents the ER today with palpitations.  She has a history of A-fib and has had palpitations and proximal atrial fibrillation multiple times.  She took an extra metoprolol and Norvasc prior to coming in and by time she got here the palpitations improved.  No diaphoresis, chest pain, lightheadedness, lower extremity swelling.  No changes in medications.  No changes in her diet.  Asymptomatic at this time as well.        Home Medications Prior to Admission medications   Medication Sig Start Date End Date Taking? Authorizing Provider  acetaminophen (TYLENOL) 500 MG tablet Take 1,000 mg by mouth daily as needed for moderate pain or mild pain (pain).    [provider]  ALPRAZolam Duanne Moron) 0.25 MG tablet Take 0.25 mg by mouth daily as needed for anxiety.    [provider]  amLODipine (NORVASC) 2.5 MG tablet Take 0.5 tablets (1.25 mg total) by mouth daily. 11/15/21   Fay Records, MD  apixaban (ELIQUIS) 5 MG TABS tablet TAKE 1 TABLET(5 MG) BY MOUTH TWICE DAILY 05/16/22   Fay Records, MD  cholecalciferol (VITAMIN D3) 25 MCG (1000 UT) tablet Take 1,000 Units by mouth daily.    [provider]  EPINEPHrine 0.3 mg/0.3 mL IJ SOAJ injection Inject 0.3 mg into the muscle as needed for anaphylaxis. As needed for life-threatening allergic reactions 04/02/22   Valentina Shaggy, MD  flecainide (TAMBOCOR) 50 MG tablet Take 1.5 tablets (75 mg total) by mouth 2 (two) times daily. 08/28/21   Fay Records, MD  gabapentin (NEURONTIN) 300 MG capsule Take 1 capsule (300 mg total) by mouth 3 (three) times daily. Patient not taking: Reported on 03/29/2022 02/01/22   Plotnikov, Evie Lacks, MD  HYDROcodone-acetaminophen (NORCO)  10-325 MG tablet Take 1 tablet by mouth every 6 (six) hours as needed. 07/02/22   Mcarthur Rossetti, MD  hydroxychloroquine (PLAQUENIL) 200 MG tablet Take 400 mg by mouth every morning.    [provider]  metoprolol tartrate (LOPRESSOR) 25 MG tablet Take 1 tablet (25 mg total) by mouth 2 (two) times daily. 03/19/22   Fay Records, MD  pantoprazole (PROTONIX) 40 MG tablet Take 1 tablet (40 mg total) by mouth daily. Patient taking differently: Take 40 mg by mouth daily as needed (Acid reflux). 03/06/22   Fay Records, MD  Probiotic Product (ALIGN) 4 MG CAPS Take 4 mg by mouth daily.    [provider]  rosuvastatin (CRESTOR) 5 MG tablet Take 1 tablet (5 mg total) by mouth daily. 09/07/21   Elodia Florence., MD  sertraline (ZOLOFT) 50 MG tablet TAKE 1 TABLET BY MOUTH DAILY 01/15/22   Plotnikov, Evie Lacks, MD      Allergies    Epinephrine base, Aspirin, Benadryl [diphenhydramine], Covid-19 ad26 vaccine(janssen), Fish allergy, Hylan g-f 20, Penicillins, Tape, and Wound dressing adhesive    Review of Systems   Review of Systems  Physical Exam Updated Vital Signs BP 134/72   Pulse 72   Temp 98.7 F (37.1 C) (Oral)   Resp 18   SpO2 95%  Physical Exam Vitals and nursing note reviewed.  Constitutional:      Appearance: She is well-developed.  HENT:  Head: Normocephalic and atraumatic.  Cardiovascular:     Rate and Rhythm: Normal rate and regular rhythm.  Pulmonary:     Effort: No respiratory distress.     Breath sounds: No stridor.  Abdominal:     General: There is no distension.  Musculoskeletal:        General: No swelling or tenderness. Normal range of motion.     Cervical back: Normal range of motion.  Skin:    General: Skin is warm and dry.  Neurological:     General: No focal deficit present.     Mental Status: She is alert.     ED Results / Procedures / Treatments   Labs (all labs ordered are listed, but only abnormal results are  displayed) Labs Reviewed  BASIC METABOLIC PANEL - Abnormal; Notable for the following components:      Result Value   Sodium 130 (*)    Glucose, Bld 118 (*)    All other components within normal limits  CBC - Abnormal; Notable for the following components:   RBC 3.33 (*)    Hemoglobin 11.1 (*)    HCT 31.7 (*)    All other components within normal limits  PROTIME-INR  TROPONIN I (HIGH SENSITIVITY)  TROPONIN I (HIGH SENSITIVITY)    EKG EKG Interpretation  Date/Time:  Saturday July 07 2022 02:11:18 EDT Ventricular Rate:  76 PR Interval:  242 QRS Duration: 96 QT Interval:  384 QTC Calculation: 432 R Axis:   67 Text Interpretation: Sinus rhythm with sinus arrhythmia with 1st degree A-V block Otherwise normal ECG When compared with ECG of 15-Jan-2022 07:32, PREVIOUS ECG IS PRESENT Confirmed by Merrily Pew [phone]) on 07/07/2022 3:55:55 AM  Radiology DG Chest 2 View  Result Date: 07/07/2022 CLINICAL DATA:  Atrial fibrillation, tachycardia EXAM: CHEST - 2 VIEW COMPARISON:  09/01/2021 FINDINGS: The heart size and mediastinal contours are within normal limits. Both lungs are clear. The visualized skeletal structures are unremarkable. IMPRESSION: No active cardiopulmonary disease. Electronically Signed   By: Fidela Salisbury M.D.   On: 07/07/2022 03:02    Procedures Procedures    Medications Ordered in ED Medications - No data to display  ED Course/ Medical Decision Making/ A&P                           Medical Decision Making Amount and/or Complexity of Data Reviewed Labs: ordered. Radiology: ordered.   Here with likely paroxysmal atrial fibrillation episode that terminated and she is in normal sinus rhythm.  Considered ACS with troponins negative EKG reassuring.  Theoretically could have been sinus however either way at this time she is stable with normal vital signs and normal labs not suggest any electrolyte derangements or other cause for the need for admission or  further management in the emergency room. She will follow up and discuss with her cardiologist.   Final Clinical Impression(s) / ED Diagnoses Final diagnoses:  Palpitations    Rx / DC Orders ED Discharge Orders     None         Tranell Wojtkiewicz, Corene Cornea, MD 07/07/22 [phone]

## 2022-07-07 NOTE — Telephone Encounter (Signed)
Patient called stating that her HR has been elevated in the 120-140s for the past 4 hours. She has taken her flecainide and po metoprolol without normalization of her HR. She denies having any symptoms. I instructed her to come to the ED for evaluation given her history of Afib. She endorses compliance with her apixaban. She verbalized an understanding.

## 2022-07-12 ENCOUNTER — Other Ambulatory Visit: Payer: Self-pay | Admitting: Internal Medicine

## 2022-07-12 ENCOUNTER — Ambulatory Visit: Payer: Medicare Other | Admitting: Psychology

## 2022-07-13 ENCOUNTER — Ambulatory Visit (INDEPENDENT_AMBULATORY_CARE_PROVIDER_SITE_OTHER): Payer: Medicare Other

## 2022-07-13 VITALS — Ht 65.0 in | Wt 225.0 lb

## 2022-07-13 DIAGNOSIS — Z0000 Encounter for general adult medical examination without abnormal findings: Secondary | ICD-10-CM | POA: Diagnosis not present

## 2022-07-13 NOTE — Progress Notes (Cosign Needed)
Subjective:   Amanda Wilkins is a 76 y.o. female who presents for Medicare Annual (Subsequent) preventive examination.   Virtual Visit via Telephone Note  I connected with  [name] on 07/13/22 at 11:00 AM EDT by telephone and verified that I am speaking with the correct person using two identifiers.  Location: Patient: Home  Provider: Paulla Fore  Persons participating in the virtual visit: patient/Nurse Health Advisor   I discussed the limitations, risks, security and privacy concerns of performing an evaluation and management service by telephone and the availability of in person appointments. The patient expressed understanding and agreed to proceed.  Interactive audio and video telecommunications were attempted between this nurse and patient, however failed, due to patient having technical difficulties OR patient did not have access to video capability.  We continued and completed visit with audio only.  Some vital signs may be absent or patient reported.   Daphane Shepherd, LPN  Review of Systems     Cardiac Risk Factors include: advanced age (>8mn, >>22women);hypertension     Objective:    Today's Vitals   07/13/22 1111  Weight: 225 lb (102.1 kg)  Height: '5\' 5"'$  (1.651 m)   Body mass index is 37.44 kg/m.     07/13/2022   11:17 AM 07/07/2022    2:11 AM 06/19/2022    8:58 AM 06/14/2022   11:33 AM 02/19/2022    2:30 PM 01/15/2022    7:38 AM 01/04/2022   12:43 PM  Advanced Directives  Does Patient Have a Medical Advance Directive? Yes No Yes Yes Yes No No  Type of AParamedicof [place]Living will  [place]Living will [place]Bay[place] BlueLiving will [place]Living will    Does patient want to make changes to medical advance directive?    No - Patient declined     Copy of HCatherinein Chart? No - copy requested  No - copy requested No - copy requested        Current Medications (verified) Outpatient Encounter Medications as of 07/13/2022  Medication Sig   acetaminophen (TYLENOL) 500 MG tablet Take 1,000 mg by mouth daily as needed for moderate pain or mild pain (pain).   ALPRAZolam (XANAX) 0.25 MG tablet Take 0.25 mg by mouth daily as needed for anxiety.   amLODipine (NORVASC) 2.5 MG tablet Take 0.5 tablets (1.25 mg total) by mouth daily.   apixaban (ELIQUIS) 5 MG TABS tablet TAKE 1 TABLET(5 MG) BY MOUTH TWICE DAILY   cholecalciferol (VITAMIN D3) 25 MCG (1000 UT) tablet Take 1,000 Units by mouth daily.   EPINEPHrine 0.3 mg/0.3 mL IJ SOAJ injection Inject 0.3 mg into the muscle as needed for anaphylaxis. As needed for life-threatening allergic reactions   flecainide (TAMBOCOR) 50 MG tablet Take 1.5 tablets (75 mg total) by mouth 2 (two) times daily.   gabapentin (NEURONTIN) 300 MG capsule Take 1 capsule (300 mg total) by mouth 3 (three) times daily.   HYDROcodone-acetaminophen (NORCO) 10-325 MG tablet Take 1 tablet by mouth every 6 (six) hours as needed.   hydroxychloroquine (PLAQUENIL) 200 MG tablet Take 400 mg by mouth every morning.   metoprolol tartrate (LOPRESSOR) 25 MG tablet Take 1 tablet (25 mg total) by mouth 2 (two) times daily.   pantoprazole (PROTONIX) 40 MG tablet Take 1 tablet (40 mg total) by mouth daily. (Patient taking differently: Take 40 mg by mouth daily as needed (Acid reflux).)   Probiotic Product (ALIGN) 4  MG CAPS Take 4 mg by mouth daily.   rosuvastatin (CRESTOR) 5 MG tablet Take 1 tablet (5 mg total) by mouth daily.   sertraline (ZOLOFT) 50 MG tablet TAKE 1 TABLET BY MOUTH DAILY   No facility-administered encounter medications on file as of 07/13/2022.    Allergies (verified) Epinephrine base, Aspirin, Benadryl [diphenhydramine], Covid-19 ad26 vaccine(janssen), Fish allergy, Hylan g-f 20, Penicillins, Tape, and Wound dressing adhesive   History: Past Medical History:  Diagnosis Date   Acute bronchitis 09/11/2016    11/17 refractory   Acute cystitis without hematuria 05/17/2015   Acute kidney injury [facility])    Labs today   Acute right ankle pain 09/09/2018   Anticoagulant long-term use    Xarelto   Axillary pain    Bronchitis    Cellulitis    Cholecystitis    Chronic interstitial nephritis    CONJUNCTIVITIS, ACUTE 10/18/2010   Qualifier: Diagnosis of  By: Plotnikov MD, Evie Lacks    D-dimer, elevated    Depression    Eye inflammation    GERD (gastroesophageal reflux disease)    History of interstitial nephritis 2016   chronic   History of kidney stones    has a small one found on xray   History of nuclear stress test 12/16/2014   Intermediate risk nuclear study w/ medium size moderate severity reversible defect in the basal and mid inferolateral and inferior wall (per dr cardiology note , dr [name] did not think this was consistent with ischemia)/  normal LV function and wall motion, ef 75%   History of septic shock 01/22/2015   in setting Group A Strep Cellulits erysipelas/chest wall induration with streptoccocus basterium-- Severe sepsis, DIC, Acute respiratory failure with pulmonary edema, Acute Kidney failure with chronic interstitial nephritis   Hypertension    Hyponatremia    Migraines    on Zoloft for migraines   Mild carotid artery disease ([place])    per duplex 08-30-2017 bilateral ICA 1-39%   OA (osteoarthritis) rheumotologist-  dr [name]   both knees,  shoulders, ankles   OSA on CPAP     moderate obstructive sleep apnea with an AHI of 23.4/h and oxygen desaturations as low as 84%.  Now on CPAP at 12 cm H2O.   PAF (paroxysmal atrial fibrillation) [facility]) 2009   cardiologist-- dr [name]   Paroxysmal atrial flutter [facility])    a. dx 11/2017.   PSVT (paroxysmal supraventricular tachycardia) (HCC)    RA (rheumatoid arthritis) ([place])    Wears contact lenses    Past Surgical History:  Procedure Laterality Date   BREAST BIOPSY Right 05/15/2017   [employer]   CARDIOVERSION N/A  08/28/2021   Procedure: CARDIOVERSION;  Surgeon: Fay Records, MD;  [facility] ENDOSCOPY;  Service: Cardiovascular;  Laterality: N/A;   [place] N/A 11/16/2021   Procedure: LAPAROSCOPIC CHOLECYSTECTOMY;  Surgeon: Ralene Ok, MD;  Location: [place];  Service: General;  Laterality: N/A;   ESOPHAGOGASTRODUODENOSCOPY N/A 02/08/2015   Procedure: ESOPHAGOGASTRODUODENOSCOPY (EGD);  Surgeon: [name], MD;  Location: [place];  Service: Endoscopy;  Laterality: N/A;   KNEE ARTHROSCOPY W/ MENISCAL REPAIR Left 08/2014    '@[facility]'$    SHOULDER SURGERY Right 04/04/2016   TOTAL HIP ARTHROPLASTY Right 06/19/2022   Procedure: RIGHT TOTAL HIP ARTHROPLASTY ANTERIOR APPROACH;  Surgeon: Mcarthur Rossetti, MD;  Location: [place];  Service: Orthopedics;  Laterality: Right;   TOTAL KNEE ARTHROPLASTY Right 09/01/2018   Procedure: RIGHT TOTAL KNEE ARTHROPLASTY;  Surgeon: [name], MD;  Location: [facility];  Service: Orthopedics;  Laterality: Right;   Family History  Problem Relation Age of Onset   Pancreatic cancer Brother    Lymphoma Mother    Prostate cancer Father        metastatic prostate cancer   Breast cancer Sister 61   Allergic rhinitis Sister    Urticaria Sister    Asthma Neg Hx    Eczema Neg Hx    Immunodeficiency Neg Hx    Atopy Neg Hx    Angioedema Neg Hx    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)  Tobacco Use   Smoking status: Never   Smokeless tobacco: Never  Vaping Use   Vaping Use: Never used  Substance and Sexual Activity   Alcohol use: Not Currently    Comment: Occasional   Drug use: Never   Sexual activity: Yes    Partners: Male    Birth control/protection:  Post-menopausal    Comment: intercourse age 84, sexual partners more than 5  Other Topics Concern   Not on file  Social History Narrative   Not on file   Social Determinants of Health   Financial Resource Strain: Low Risk  (07/13/2022)   Overall Financial Resource Strain (CARDIA)    Difficulty of Paying Living Expenses: Not hard at all  Food Insecurity: No Food Insecurity (07/13/2022)   Hunger Vital Sign    Worried About Running Out of Food in the Last Year: Never true    [place] in the Last Year: Never true  Transportation Needs: No Transportation Needs (07/13/2022)   PRAPARE - Hydrologist (Medical): No    Lack of Transportation (Non-Medical): No  Physical Activity: Insufficiently Active (07/13/2022)   Exercise Vital Sign    Days of Exercise per Week: 3 days    Minutes of Exercise per Session: 30 min  Stress: No Stress Concern Present (07/13/2022)   Apache    Feeling of Stress : Not at all  Social Connections: Moderately Isolated (07/13/2022)   Social Connection and Isolation Panel [NHANES]    Frequency of Communication with Friends and Family: More than three times a week    Frequency of Social Gatherings with Friends and Family: More than three times a week    Attends Religious Services: Never    Marine scientist or Organizations: No    Attends Music therapist: Never    Marital Status: Married    Tobacco Counseling Counseling given: Not Answered   Clinical Intake:  Pre-visit preparation completed: Yes  Pain : No/denies pain     Nutritional Risks: None Diabetes: No  How often do you need to have someone help you when you read instructions, pamphlets, or other written materials from your doctor or pharmacy?: 1 - Never  Diabetic?no   Interpreter Needed?: No  Information entered by :: Jadene Pierini, LPN   Activities of Daily Living     07/13/2022   11:17 AM 06/14/2022   11:36 AM  In your present state of health, do you have any difficulty performing the following activities:  Hearing? 0   Vision? 0   Difficulty concentrating or making decisions? 0   Walking or climbing stairs? 0   Dressing or bathing? 0   Doing errands, shopping? 0 0  Preparing Food and eating ? N   Using the Toilet? N   In the past six months, have you accidently leaked urine? N   Do you have problems with loss of bowel control? N   Managing your Medications? N   Managing your Finances? N   Housekeeping or managing your Housekeeping? N     Patient Care Team: Plotnikov, Evie Lacks, MD as PCP - General Fay Records, MD as PCP - Cardiology (Cardiology) Terrance Mass, MD (Inactive) as Consulting Physician (Gynecology) Leanora Cover, MD as Consulting Physician (Orthopedic Surgery) Cameron Sprang, MD as Consulting Physician (Neurology) [name], MD as Consulting Physician (Orthopedic Surgery) Regal, Tamala Fothergill, DPM as Consulting Physician (Podiatry) Royetta Crochet, PhD (Psychology) Camillo Flaming, OD as Referring Physician (Optometry)  Indicate any recent Medical Services you may have received from other than [facility] providers in the past year (date may be approximate).     Assessment:   This is a routine wellness examination for [facility].  Hearing/Vision screen Vision Screening - Comments:: Annual eye exams wear glasses and contacts   Dietary issues and exercise activities discussed: Current Exercise Habits: Home exercise routine, Type of exercise: strength training/weights, Time (Minutes): 30, Frequency (Times/Week): 3, Weekly Exercise (Minutes/Week): 90, Intensity: Mild, Exercise limited by: orthopedic condition(s)   Goals Addressed               This Visit's Progress     Patient Stated (pt-stated)   On track     Continue to lose weight and get back down to 200 pounds.       Depression Screen    07/13/2022   11:16 AM  06/07/2022    2:35 PM 01/16/2022    3:00 PM 11/22/2021   10:04 AM 06/20/2021    2:01 PM 05/05/2020   11:46 AM 05/05/2019   11:18 AM  PHQ 2/9 Scores  PHQ - 2 Score 0 1 0 0 0 0 0  PHQ- 9 Score   0    0    Fall Risk    07/13/2022   11:12 AM 06/07/2022    2:35 PM 01/16/2022    3:00 PM 11/22/2021   10:04 AM 06/20/2021    1:23 PM  [place] in the past year? 0 0 0  0  Number falls in past yr: 0 0 0 0 0  Injury with Fall? 0 0 0 0 0  Risk for fall due to : No Fall Risks No Fall Risks No Fall Risks  No Fall Risks  Follow up Falls prevention discussed Falls evaluation completed Falls evaluation completed  Falls evaluation completed    [place]:  Any stairs in or around the home? Yes  If so, are there any without handrails? No  Home free of loose throw rugs in walkways, pet beds, electrical cords, etc? Yes  Adequate lighting in your home to reduce risk of falls? Yes   ASSISTIVE DEVICES UTILIZED TO PREVENT FALLS:  Life alert? No  Use of a cane, walker or w/c? Yes  Grab bars in the bathroom? Yes  Shower chair or bench in shower? Yes  Elevated toilet seat or a handicapped toilet? Yes          07/13/2022  11:18 AM 05/05/2020   11:50 AM  6CIT Screen  What Year? 0 points 0 points  What month? 0 points 0 points  What time? 0 points 0 points  Count back from 20 0 points 0 points  Months in reverse 0 points 0 points  Repeat phrase 0 points 0 points  Total Score 0 points 0 points    Immunizations Immunization History  Administered Date(s) Administered   Fluad Quad(high Dose 65+) 08/03/2019, 07/20/2020, 08/22/2021   Influenza Split 09/24/2011, 09/24/2012   Influenza Whole 08/09/2010   Influenza, High Dose Seasonal PF 08/16/2016, 07/26/2017, 07/16/2018, 08/03/2019, 07/20/2020, 08/22/2021   Influenza,inj,Quad PF,6+ Mos 07/14/2013, 07/27/2014, 06/14/2015   PFIZER Comirnaty(Gray Top)Covid-19 Tri-Sucrose Vaccine 11/05/2019, 11/26/2019, 07/09/2020,  03/09/2021   PFIZER(Purple Top)SARS-COV-2 Vaccination 11/05/2019, 11/26/2019, 07/09/2020, 03/09/2021   Pneumococcal Conjugate-13 05/25/2014   Pneumococcal Polysaccharide-23 06/21/2015   Tdap 01/06/2013   Zoster, Live 01/24/2011    TDAP status: Up to date  Flu Vaccine status: Due, Education has been provided regarding the importance of this vaccine. Advised may receive this vaccine at local pharmacy or Health Dept. Aware to provide a copy of the vaccination record if obtained from local pharmacy or Health Dept. Verbalized acceptance and understanding.  Pneumococcal vaccine status: Up to date  Covid-19 vaccine status: Completed vaccines  Qualifies for Shingles Vaccine? Yes   Zostavax completed No   Shingrix Completed?: No.    Education has been provided regarding the importance of this vaccine. Patient has been advised to call insurance company to determine out of pocket expense if they have not yet received this vaccine. Advised may also receive vaccine at local pharmacy or Health Dept. Verbalized acceptance and understanding.  Screening Tests Health Maintenance  Topic Date Due   COLONOSCOPY (Pts 45-28yr Insurance coverage will need to be confirmed)  01/28/2021   COVID-19 Vaccine (9 - Pfizer risk series) 05/04/2021   Zoster Vaccines- Shingrix (1 of 2) 12/08/2022 (Originally 12/06/1964)   INFLUENZA VACCINE  01/13/2023 (Originally 05/15/2022)   TETANUS/TDAP  01/07/2023   Pneumonia Vaccine 75 Years old  Completed   DEXA SCAN  Completed   Hepatitis C Screening  Completed   HPV VACCINES  Aged Out    Health Maintenance  Health Maintenance Due  Topic Date Due   COLONOSCOPY (Pts 45-465yrInsurance coverage will need to be confirmed)  01/28/2021   COVID-19 Vaccine (9 - Pfizer risk series) 05/04/2021    Colorectal cancer screening: No longer required.   Mammogram status: No longer required due to age.  Bone Density status: Completed 05/30/2017. Results reflect: Bone density results:  OSTEOPENIA. Repeat every 5 years.  Lung Cancer Screening: (Low Dose CT Chest recommended if Age 75-80ears, 30 pack-year currently smoking OR have quit w/in 15years.) does not qualify.   Lung Cancer Screening Referral: n/a  Additional Screening:  Hepatitis C Screening: does not qualify;   Vision Screening: Recommended annual ophthalmology exams for early detection of glaucoma and other disorders of the eye. Is the patient up to date with their annual eye exam?  Yes  Who is the provider or what is the name of the office in which the patient attends annual eye exams? Dr. MaDanae ChenIf pt is not established with a provider, would they like to be referred to a provider to establish care? No .   Dental Screening: Recommended annual dental exams for proper oral hygiene  Community Resource Referral / Chronic Care Management: CRR required this visit?  No   CCM required this visit?  No  Plan:     I have personally reviewed and noted the following in the patient's chart:   Medical and social history Use of alcohol, tobacco or illicit drugs  Current medications and supplements including opioid prescriptions. Patient is not currently taking opioid prescriptions. Functional ability and status Nutritional status Physical activity Advanced directives List of other physicians Hospitalizations, surgeries, and ER visits in previous 12 months Vitals Screenings to include cognitive, depression, and falls Referrals and appointments  In addition, I have reviewed and discussed with patient certain preventive protocols, quality metrics, and best practice recommendations. A written personalized care plan for preventive services as well as general preventive health recommendations were provided to patient.     Daphane Shepherd, LPN   12/26/9700   Nurse Notes: Due flu vaccine    Medical screening examination/treatment/procedure(s) were performed by non-physician practitioner and as  supervising physician I was immediately available for consultation/collaboration.  I agree with above. Lew Dawes, MD

## 2022-07-13 NOTE — Patient Instructions (Signed)
Amanda Wilkins , Thank you for taking time to come for your Medicare Wellness Visit. I appreciate your ongoing commitment to your health goals. Please review the following plan we discussed and let me know if I can assist you in the future.   These are the goals we discussed:  Goals       Patient Stated (pt-stated)      Continue to lose weight and get back down to 200 pounds.      Stay healthy to keep my  heart strong (pt-stated)      Continue to lose weight, eat healthy and exercise.        This is a list of the screening recommended for you and due dates:  Health Maintenance  Topic Date Due   Colon Cancer Screening  01/28/2021   COVID-19 Vaccine (9 - Pfizer risk series) 05/04/2021   Zoster (Shingles) Vaccine (1 of 2) 12/08/2022*   Flu Shot  01/13/2023*   Tetanus Vaccine  01/07/2023   Pneumonia Vaccine  Completed   DEXA scan (bone density measurement)  Completed   Hepatitis C Screening: USPSTF Recommendation to screen - Ages 90-79 yo.  Completed   HPV Vaccine  Aged Out  *Topic was postponed. The date shown is not the original due date.    Advanced directives: Please bring a copy of your health care power of attorney and living will to the office to be added to your chart at your convenience.   Conditions/risks identified: Aim for 30 minutes of exercise or brisk walking, 6-8 glasses of water, and 5 servings of fruits and vegetables each day.   Next appointment: Follow up in one year for your annual wellness visit    Preventive Care 65 Years and Older, Female Preventive care refers to lifestyle choices and visits with your health care provider that can promote health and wellness. What does preventive care include? A yearly physical exam. This is also called an annual well check. Dental exams once or twice a year. Routine eye exams. Ask your health care provider how often you should have your eyes checked. Personal lifestyle choices, including: Daily care of your teeth and  gums. Regular physical activity. Eating a healthy diet. Avoiding tobacco and drug use. Limiting alcohol use. Practicing safe sex. Taking low-dose aspirin every day. Taking vitamin and mineral supplements as recommended by your health care provider. What happens during an annual well check? The services and screenings done by your health care provider during your annual well check will depend on your age, overall health, lifestyle risk factors, and family history of disease. Counseling  Your health care provider may ask you questions about your: Alcohol use. Tobacco use. Drug use. Emotional well-being. Home and relationship well-being. Sexual activity. Eating habits. History of falls. Memory and ability to understand (cognition). Work and work Statistician. Reproductive health. Screening  You may have the following tests or measurements: Height, weight, and BMI. Blood pressure. Lipid and cholesterol levels. These may be checked every 5 years, or more frequently if you are over 39 years old. Skin check. Lung cancer screening. You may have this screening every year starting at age 44 if you have a 30-pack-year history of smoking and currently smoke or have quit within the past 15 years. Fecal occult blood test (FOBT) of the stool. You may have this test every year starting at age 71. Flexible sigmoidoscopy or colonoscopy. You may have a sigmoidoscopy every 5 years or a colonoscopy every 10 years starting at age 64.  Hepatitis C blood test. Hepatitis B blood test. Sexually transmitted disease (STD) testing. Diabetes screening. This is done by checking your blood sugar (glucose) after you have not eaten for a while (fasting). You may have this done every 1-3 years. Bone density scan. This is done to screen for osteoporosis. You may have this done starting at age 70. Mammogram. This may be done every 1-2 years. Talk to your health care provider about how often you should have regular  mammograms. Talk with your health care provider about your test results, treatment options, and if necessary, the need for more tests. Vaccines  Your health care provider may recommend certain vaccines, such as: Influenza vaccine. This is recommended every year. Tetanus, diphtheria, and acellular pertussis (Tdap, Td) vaccine. You may need a Td booster every 10 years. Zoster vaccine. You may need this after age 60. Pneumococcal 13-valent conjugate (PCV13) vaccine. One dose is recommended after age 71. Pneumococcal polysaccharide (PPSV23) vaccine. One dose is recommended after age 7. Talk to your health care provider about which screenings and vaccines you need and how often you need them. This information is not intended to replace advice given to you by your health care provider. Make sure you discuss any questions you have with your health care provider. Document Released: 10/28/2015 Document Revised: 06/20/2016 Document Reviewed: 08/02/2015 Elsevier Interactive Patient Education  2017 [place] Prevention in the Home Falls can cause injuries. They can happen to people of all ages. There are many things you can do to make your home safe and to help prevent falls. What can I do on the outside of my home? Regularly fix the edges of walkways and driveways and fix any cracks. Remove anything that might make you trip as you walk through a door, such as a raised step or threshold. Trim any bushes or trees on the path to your home. Use bright outdoor lighting. Clear any walking paths of anything that might make someone trip, such as rocks or tools. Regularly check to see if handrails are loose or broken. Make sure that both sides of any steps have handrails. Any raised decks and porches should have guardrails on the edges. Have any leaves, snow, or ice cleared regularly. Use sand or salt on walking paths during winter. Clean up any spills in your garage right away. This includes oil  or grease spills. What can I do in the bathroom? Use night lights. Install grab bars by the toilet and in the tub and shower. Do not use towel bars as grab bars. Use non-skid mats or decals in the tub or shower. If you need to sit down in the shower, use a plastic, non-slip stool. Keep the floor dry. Clean up any water that spills on the floor as soon as it happens. Remove soap buildup in the tub or shower regularly. Attach bath mats securely with double-sided non-slip rug tape. Do not have throw rugs and other things on the floor that can make you trip. What can I do in the bedroom? Use night lights. Make sure that you have a light by your bed that is easy to reach. Do not use any sheets or blankets that are too big for your bed. They should not hang down onto the floor. Have a firm chair that has side arms. You can use this for support while you get dressed. Do not have throw rugs and other things on the floor that can make you trip. What can I do in  the kitchen? Clean up any spills right away. Avoid walking on wet floors. Keep items that you use a lot in easy-to-reach places. If you need to reach something above you, use a strong step stool that has a grab bar. Keep electrical cords out of the way. Do not use floor polish or wax that makes floors slippery. If you must use wax, use non-skid floor wax. Do not have throw rugs and other things on the floor that can make you trip. What can I do with my stairs? Do not leave any items on the stairs. Make sure that there are handrails on both sides of the stairs and use them. Fix handrails that are broken or loose. Make sure that handrails are as long as the stairways. Check any carpeting to make sure that it is firmly attached to the stairs. Fix any carpet that is loose or worn. Avoid having throw rugs at the top or bottom of the stairs. If you do have throw rugs, attach them to the floor with carpet tape. Make sure that you have a light  switch at the top of the stairs and the bottom of the stairs. If you do not have them, ask someone to add them for you. What else can I do to help prevent falls? Wear shoes that: Do not have high heels. Have rubber bottoms. Are comfortable and fit you well. Are closed at the toe. Do not wear sandals. If you use a stepladder: Make sure that it is fully opened. Do not climb a closed stepladder. Make sure that both sides of the stepladder are locked into place. Ask someone to hold it for you, if possible. Clearly mark and make sure that you can see: Any grab bars or handrails. First and last steps. Where the edge of each step is. Use tools that help you move around (mobility aids) if they are needed. These include: Canes. Walkers. Scooters. Crutches. Turn on the lights when you go into a dark area. Replace any light bulbs as soon as they burn out. Set up your furniture so you have a clear path. Avoid moving your furniture around. If any of your floors are uneven, fix them. If there are any pets around you, be aware of where they are. Review your medicines with your doctor. Some medicines can make you feel dizzy. This can increase your chance of falling. Ask your doctor what other things that you can do to help prevent falls. This information is not intended to replace advice given to you by your health care provider. Make sure you discuss any questions you have with your health care provider. Document Released: 07/28/2009 Document Revised: 03/08/2016 Document Reviewed: 11/05/2014 Elsevier Interactive Patient Education  2017 [employer].

## 2022-07-17 DIAGNOSIS — M25551 Pain in right hip: Secondary | ICD-10-CM | POA: Diagnosis not present

## 2022-07-17 DIAGNOSIS — M6281 Muscle weakness (generalized): Secondary | ICD-10-CM | POA: Diagnosis not present

## 2022-07-17 DIAGNOSIS — M47817 Spondylosis without myelopathy or radiculopathy, lumbosacral region: Secondary | ICD-10-CM | POA: Diagnosis not present

## 2022-07-17 DIAGNOSIS — M7918 Myalgia, other site: Secondary | ICD-10-CM | POA: Diagnosis not present

## 2022-07-18 ENCOUNTER — Ambulatory Visit (INDEPENDENT_AMBULATORY_CARE_PROVIDER_SITE_OTHER): Payer: Medicare Other | Admitting: Internal Medicine

## 2022-07-18 ENCOUNTER — Encounter: Payer: Self-pay | Admitting: Internal Medicine

## 2022-07-18 VITALS — BP 118/82 | HR 65 | Temp 98.2°F | Ht 65.0 in | Wt 229.6 lb

## 2022-07-18 DIAGNOSIS — B0229 Other postherpetic nervous system involvement: Secondary | ICD-10-CM

## 2022-07-18 DIAGNOSIS — R110 Nausea: Secondary | ICD-10-CM | POA: Diagnosis not present

## 2022-07-18 DIAGNOSIS — Z23 Encounter for immunization: Secondary | ICD-10-CM | POA: Diagnosis not present

## 2022-07-18 DIAGNOSIS — M1611 Unilateral primary osteoarthritis, right hip: Secondary | ICD-10-CM | POA: Diagnosis not present

## 2022-07-18 DIAGNOSIS — D649 Anemia, unspecified: Secondary | ICD-10-CM | POA: Diagnosis not present

## 2022-07-18 DIAGNOSIS — I4819 Other persistent atrial fibrillation: Secondary | ICD-10-CM

## 2022-07-18 DIAGNOSIS — I2510 Atherosclerotic heart disease of native coronary artery without angina pectoris: Secondary | ICD-10-CM | POA: Diagnosis not present

## 2022-07-18 DIAGNOSIS — E871 Hypo-osmolality and hyponatremia: Secondary | ICD-10-CM

## 2022-07-18 MED ORDER — FAMOTIDINE 40 MG PO TABS: 40.0000 mg | ORAL_TABLET | Freq: Every day | ORAL | 3 refills | Status: DC

## 2022-07-18 MED ORDER — HYDROCODONE-ACETAMINOPHEN 10-325 MG PO TABS: 1.0000 | ORAL_TABLET | Freq: Four times a day (QID) | ORAL | 0 refills | Status: DC | PRN

## 2022-07-18 MED ORDER — ALPRAZOLAM 0.25 MG PO TABS: 0.2500 mg | ORAL_TABLET | Freq: Two times a day (BID) | ORAL | 3 refills | Status: DC | PRN

## 2022-07-18 NOTE — Progress Notes (Signed)
Subjective:  Patient ID: Amanda Wilkins, female    DOB: 1946-06-24  Age: 76 y.o. MRN: [id number]  CC: Follow-up   HPI Amanda Wilkins presents for HTN, A fib, OA f/u C/o mild anemia post-op C/wt gain  Outpatient Medications Prior to Visit  Medication Sig Dispense Refill   acetaminophen (TYLENOL) 500 MG tablet Take 1,000 mg by mouth daily as needed for moderate pain or mild pain (pain).     amLODipine (NORVASC) 2.5 MG tablet Take 0.5 tablets (1.25 mg total) by mouth daily. 90 tablet 3   apixaban (ELIQUIS) 5 MG TABS tablet TAKE 1 TABLET(5 MG) BY MOUTH TWICE DAILY 60 tablet 5   cholecalciferol (VITAMIN D3) 25 MCG (1000 UT) tablet Take 1,000 Units by mouth daily.     EPINEPHrine 0.3 mg/0.3 mL IJ SOAJ injection Inject 0.3 mg into the muscle as needed for anaphylaxis. As needed for life-threatening allergic reactions 2 each 1   flecainide (TAMBOCOR) 50 MG tablet Take 1.5 tablets (75 mg total) by mouth 2 (two) times daily. 135 tablet 3   hydroxychloroquine (PLAQUENIL) 200 MG tablet Take 400 mg by mouth every morning.     metoprolol tartrate (LOPRESSOR) 25 MG tablet Take 1 tablet (25 mg total) by mouth 2 (two) times daily. 180 tablet 3   pantoprazole (PROTONIX) 40 MG tablet Take 1 tablet (40 mg total) by mouth daily. (Patient taking differently: Take 40 mg by mouth daily as needed (Acid reflux).) 30 tablet 0   Probiotic Product (ALIGN) 4 MG CAPS Take 4 mg by mouth daily.     rosuvastatin (CRESTOR) 5 MG tablet Take 1 tablet (5 mg total) by mouth daily. 90 tablet 3   sertraline (ZOLOFT) 50 MG tablet TAKE 1 TABLET BY MOUTH DAILY 90 tablet 1   ALPRAZolam (XANAX) 0.25 MG tablet Take 0.25 mg by mouth daily as needed for anxiety.     HYDROcodone-acetaminophen (NORCO) 10-325 MG tablet Take 1 tablet by mouth every 6 (six) hours as needed. 30 tablet 0   gabapentin (NEURONTIN) 300 MG capsule Take 1 capsule (300 mg total) by mouth 3 (three) times daily. (Patient not taking: Reported on 07/18/2022)  90 capsule 3   No facility-administered medications prior to visit.    ROS: Review of Systems  Constitutional:  Negative for activity change, appetite change, chills, fatigue and unexpected weight change.  HENT:  Negative for congestion, mouth sores and sinus pressure.   Eyes:  Negative for visual disturbance.  Respiratory:  Negative for cough and chest tightness.   Gastrointestinal:  Negative for abdominal pain and nausea.  Genitourinary:  Negative for difficulty urinating, frequency and vaginal pain.  Musculoskeletal:  Negative for back pain and gait problem.  Skin:  Negative for pallor and rash.  Neurological:  Negative for dizziness, tremors, weakness, numbness and headaches.  Psychiatric/Behavioral:  Negative for confusion and sleep disturbance.     Objective:  BP 118/82 (BP Location: Left Arm)   Pulse 65   Temp 98.2 F (36.8 C) (Oral)   Ht '5\' 5"'$  (1.651 m)   Wt 229 lb 9.6 oz (104.1 kg)   SpO2 97%   BMI 38.21 kg/m   BP Readings from Last 3 Encounters:  07/18/22 118/82  07/07/22 134/72  06/20/22 (!) 134/55    Wt Readings from Last 3 Encounters:  07/18/22 229 lb 9.6 oz (104.1 kg)  07/13/22 225 lb (102.1 kg)  06/19/22 231 lb (104.8 kg)    Physical Exam Constitutional:      General: She is not  in acute distress.    Appearance: She is well-developed. She is obese.  HENT:     Head: Normocephalic.     Right Ear: External ear normal.     Left Ear: External ear normal.     Nose: Nose normal.  Eyes:     General:        Right eye: No discharge.        Left eye: No discharge.     Conjunctiva/sclera: Conjunctivae normal.     Pupils: Pupils are equal, round, and reactive to light.  Neck:     Thyroid: No thyromegaly.     Vascular: No JVD.     Trachea: No tracheal deviation.  Cardiovascular:     Rate and Rhythm: Normal rate and regular rhythm.     Heart sounds: Normal heart sounds.  Pulmonary:     Effort: No respiratory distress.     Breath sounds: No stridor. No  wheezing.  Abdominal:     General: Bowel sounds are normal. There is no distension.     Palpations: Abdomen is soft. There is no mass.     Tenderness: There is no abdominal tenderness. There is no guarding or rebound.  Musculoskeletal:        General: No tenderness.     Cervical back: Normal range of motion and neck supple. No rigidity.  Lymphadenopathy:     Cervical: No cervical adenopathy.  Skin:    Findings: No erythema or rash.  Neurological:     Cranial Nerves: No cranial nerve deficit.     Motor: No abnormal muscle tone.     Coordination: Coordination normal.     Gait: Gait abnormal.     Deep Tendon Reflexes: Reflexes normal.  Psychiatric:        Behavior: Behavior normal.        Thought Content: Thought content normal.        Judgment: Judgment normal.     Lab Results  Component Value Date   WBC 7.3 07/07/2022   HGB 11.1 (L) 07/07/2022   HCT 31.7 (L) 07/07/2022   PLT 249 07/07/2022   GLUCOSE 118 (H) 07/07/2022   CHOL 168 03/06/2022   TRIG 52 03/06/2022   HDL 98 03/06/2022   LDLCALC 59 03/06/2022   ALT 28 01/15/2022   AST 32 01/15/2022   NA 130 (L) 07/07/2022   K 4.6 07/07/2022   CL 98 07/07/2022   CREATININE 0.78 07/07/2022   BUN 19 07/07/2022   CO2 22 07/07/2022   TSH 4.86 01/02/2022   INR 1.1 07/07/2022   HGBA1C 5.8 (H) 01/30/2015    DG Chest 2 View  Result Date: 07/07/2022 CLINICAL DATA:  Atrial fibrillation, tachycardia EXAM: CHEST - 2 VIEW COMPARISON:  09/01/2021 FINDINGS: The heart size and mediastinal contours are within normal limits. Both lungs are clear. The visualized skeletal structures are unremarkable. IMPRESSION: No active cardiopulmonary disease. Electronically Signed   By: Fidela Salisbury M.D.   On: 07/07/2022 03:02    Assessment & Plan:   Problem List Items Addressed This Visit     Anemia   Relevant Orders   CBC with Differential/Platelet   Iron, TIBC and Ferritin Panel   Comprehensive metabolic panel   Hyponatremia    No  relapse      Relevant Orders   Lipase   Nausea   Relevant Orders   Lipase   Osteoarthritis of hip    S/p R THR 2023      Relevant Medications  HYDROcodone-acetaminophen (NORCO) 10-325 MG tablet   Persistent atrial fibrillation (HCC)    Continue with Eliquis, Atenolol, flecainide       Postherpetic neuralgia - Primary    Overall better      Other Visit Diagnoses     Needs flu shot       Relevant Orders   Flu Vaccine QUAD High Dose(Fluad) (Completed)         Meds ordered this encounter  Medications   famotidine (PEPCID) 40 MG tablet    Sig: Take 1 tablet (40 mg total) by mouth daily.    Dispense:  90 tablet    Refill:  3   ALPRAZolam (XANAX) 0.25 MG tablet    Sig: Take 1 tablet (0.25 mg total) by mouth 2 (two) times daily as needed for anxiety.    Dispense:  60 tablet    Refill:  3   HYDROcodone-acetaminophen (NORCO) 10-325 MG tablet    Sig: Take 1 tablet by mouth every 6 (six) hours as needed for severe pain.    Dispense:  100 tablet    Refill:  0      Follow-up: Return in about 2 months (around 09/17/2022).  [name], MD

## 2022-07-18 NOTE — Assessment & Plan Note (Addendum)
S/p R THR 2023

## 2022-07-18 NOTE — Assessment & Plan Note (Signed)
Continue with Eliquis, Atenolol, flecainide

## 2022-07-18 NOTE — Assessment & Plan Note (Signed)
No relapse 

## 2022-07-18 NOTE — Assessment & Plan Note (Signed)
Overall better 

## 2022-07-19 ENCOUNTER — Ambulatory Visit: Payer: Medicare Other | Admitting: Psychology

## 2022-07-19 DIAGNOSIS — M6281 Muscle weakness (generalized): Secondary | ICD-10-CM | POA: Diagnosis not present

## 2022-07-19 DIAGNOSIS — M47817 Spondylosis without myelopathy or radiculopathy, lumbosacral region: Secondary | ICD-10-CM | POA: Diagnosis not present

## 2022-07-19 DIAGNOSIS — M7918 Myalgia, other site: Secondary | ICD-10-CM | POA: Diagnosis not present

## 2022-07-19 DIAGNOSIS — M25551 Pain in right hip: Secondary | ICD-10-CM | POA: Diagnosis not present

## 2022-07-24 DIAGNOSIS — M25551 Pain in right hip: Secondary | ICD-10-CM | POA: Diagnosis not present

## 2022-07-24 DIAGNOSIS — M47817 Spondylosis without myelopathy or radiculopathy, lumbosacral region: Secondary | ICD-10-CM | POA: Diagnosis not present

## 2022-07-24 DIAGNOSIS — M6281 Muscle weakness (generalized): Secondary | ICD-10-CM | POA: Diagnosis not present

## 2022-07-24 DIAGNOSIS — M7918 Myalgia, other site: Secondary | ICD-10-CM | POA: Diagnosis not present

## 2022-07-26 ENCOUNTER — Ambulatory Visit: Payer: Medicare Other | Admitting: Psychology

## 2022-07-26 DIAGNOSIS — M25551 Pain in right hip: Secondary | ICD-10-CM | POA: Diagnosis not present

## 2022-07-26 DIAGNOSIS — M6281 Muscle weakness (generalized): Secondary | ICD-10-CM | POA: Diagnosis not present

## 2022-07-26 DIAGNOSIS — M7918 Myalgia, other site: Secondary | ICD-10-CM | POA: Diagnosis not present

## 2022-07-26 DIAGNOSIS — M47817 Spondylosis without myelopathy or radiculopathy, lumbosacral region: Secondary | ICD-10-CM | POA: Diagnosis not present

## 2022-07-30 ENCOUNTER — Ambulatory Visit (INDEPENDENT_AMBULATORY_CARE_PROVIDER_SITE_OTHER): Payer: Medicare Other | Admitting: Podiatry

## 2022-07-30 ENCOUNTER — Encounter: Payer: Self-pay | Admitting: Podiatry

## 2022-07-30 DIAGNOSIS — I2510 Atherosclerotic heart disease of native coronary artery without angina pectoris: Secondary | ICD-10-CM

## 2022-07-30 DIAGNOSIS — M7752 Other enthesopathy of left foot: Secondary | ICD-10-CM

## 2022-07-30 DIAGNOSIS — L600 Ingrowing nail: Secondary | ICD-10-CM

## 2022-07-30 MED ORDER — TRIAMCINOLONE ACETONIDE 10 MG/ML IJ SUSP
10.0000 mg | Freq: Once | INTRAMUSCULAR | Status: AC
  Administered 2022-07-30: 10 mg

## 2022-07-30 NOTE — Patient Instructions (Signed)

## 2022-07-31 DIAGNOSIS — M6281 Muscle weakness (generalized): Secondary | ICD-10-CM | POA: Diagnosis not present

## 2022-07-31 DIAGNOSIS — M7918 Myalgia, other site: Secondary | ICD-10-CM | POA: Diagnosis not present

## 2022-07-31 DIAGNOSIS — M25551 Pain in right hip: Secondary | ICD-10-CM | POA: Diagnosis not present

## 2022-07-31 DIAGNOSIS — M47817 Spondylosis without myelopathy or radiculopathy, lumbosacral region: Secondary | ICD-10-CM | POA: Diagnosis not present

## 2022-08-01 ENCOUNTER — Ambulatory Visit: Payer: Medicare Other | Admitting: Orthopaedic Surgery

## 2022-08-01 NOTE — Progress Notes (Signed)
Subjective:   Patient ID: Amanda Wilkins, female   DOB: 76 y.o.   MRN: [id number]   HPI Patient states she is having trouble with her left big toenail that has split and is mildly tender on the medial side and catches socks and also complains of secondary pain of inflammation pain of the joint of the metatarsal that she is not sure if she may have injured   ROS      Objective:  Physical Exam  Neurovascular status intact with a nailbed left hallux that does have a split in the proximal two thirds of the bed that is causing irritation localized to the area and I also noted secondarily inflammation and pain of the metatarsal phalangeal joint left with no restriction of motion     Assessment:  Ingrown toenail deformity of the left hallux medial side with a elevated split of the nailbed along with inflammatory capsulitis of the first MPJ left which is probably acute in nature with no indications of arthritis     Plan:  H&P reviewed both conditions.  For the nail I debrided out a small amount of the medial side I then had it rasped down to try to be smooth and ultimately may require nail removal which I educated her on today.  For the joint I did do sterile prep I injected the first MPJ 3 mg Kenalog 5 mg Xylocaine and applied sterile dressing.  Patient will be seen back as symptoms indicate

## 2022-08-01 NOTE — Progress Notes (Unsigned)
Cardiology Office Note   Date:  08/02/2022   ID:  Amanda Wilkins, DOB [birth year], MRN [id number]  PCP:  [name], MD  Cardiologist:   [name], MD    F/u of PAF     History of Present Illness: Amanda Wilkins is a 76 y.o. female with a history of chest pain, near syncope  and PAF  Echo in 2016 showed normal LVEF Carotid USN showed mild plaquing   Holter showed intermitt afib/SVT (first Dx 2010)  Recomm flecanide; pt declined Rx Myovue showed inferolateral and inferior defect consistent with ischemia  CT calcium score 2020 was 0 PFTs done for dyspnea were normal Echo in 2021 normal LVEF /RVEF CPX done in Aug 2021.  Limitation felt due to body weight  Pt enrolled into [facility] and felt much better with no signif dyspnea  First documented afib was in Nov 2022 when she was admitted for afib wtth RVR  Started on Flecanide.  Set up for outpt cardioversion She had this done on 08/28/21   Converted to SR but reverted back to afib before d/c from the endo unit.    Flecanide was increased to 75 bid    plan for cardioversoin  She converted on own with increased dose      I saw the pt in Dec   She was seen by D Dunn in APril 2023 prior to hip surgery  Surgery went good  The pt has had one episode of afib since thatn lasted about 4 hours   Denied dizziness or SOB  Went to ED   was in SR when seen  Admits to having a little wine prior   Since then has done OK Breathing is OK   No CP   Going to PT for hip        Current Meds  Medication Sig   acetaminophen (TYLENOL) 500 MG tablet Take 1,000 mg by mouth daily as needed for moderate pain or mild pain (pain).   ALPRAZolam (XANAX) 0.25 MG tablet Take 1 tablet (0.25 mg total) by mouth 2 (two) times daily as needed for anxiety.   amLODipine (NORVASC) 2.5 MG tablet Take 0.5 tablets (1.25 mg total) by mouth daily.   apixaban (ELIQUIS) 5 MG TABS tablet TAKE 1 TABLET(5 MG) BY MOUTH TWICE DAILY   cholecalciferol (VITAMIN D3) 25 MCG  (1000 UT) tablet Take 1,000 Units by mouth daily.   EPINEPHrine 0.3 mg/0.3 mL IJ SOAJ injection Inject 0.3 mg into the muscle as needed for anaphylaxis. As needed for life-threatening allergic reactions   famotidine (PEPCID) 40 MG tablet Take 1 tablet (40 mg total) by mouth daily.   flecainide (TAMBOCOR) 50 MG tablet Take 1.5 tablets (75 mg total) by mouth 2 (two) times daily.   HYDROcodone-acetaminophen (NORCO) 10-325 MG tablet Take 1 tablet by mouth every 6 (six) hours as needed for severe pain.   hydroxychloroquine (PLAQUENIL) 200 MG tablet Take 400 mg by mouth every morning.   metoprolol tartrate (LOPRESSOR) 25 MG tablet Take 1 tablet (25 mg total) by mouth 2 (two) times daily.   pantoprazole (PROTONIX) 40 MG tablet Take 1 tablet (40 mg total) by mouth daily. (Patient taking differently: Take 40 mg by mouth daily as needed (Acid reflux).)   Probiotic Product (ALIGN) 4 MG CAPS Take 4 mg by mouth daily.   rosuvastatin (CRESTOR) 5 MG tablet Take 1 tablet (5 mg total) by mouth daily.   sertraline (ZOLOFT) 50 MG tablet TAKE 1 TABLET BY MOUTH DAILY  Allergies:   Epinephrine base, Aspirin, Benadryl [diphenhydramine], Covid-19 ad26 vaccine(janssen), Fish allergy, Hylan g-f 20, Penicillins, Tape, and Wound dressing adhesive   Past Medical History:  Diagnosis Date   Acute bronchitis 09/11/2016   11/17 refractory   Acute cystitis without hematuria 05/17/2015   Acute kidney injury [facility])    Labs today   Acute right ankle pain 09/09/2018   Anticoagulant long-term use    Xarelto   Axillary pain    Bronchitis    Cellulitis    Cholecystitis    Chronic interstitial nephritis    CONJUNCTIVITIS, ACUTE 10/18/2010   Qualifier: Diagnosis of  By: Plotnikov MD, Evie Lacks    D-dimer, elevated    Depression    Eye inflammation    GERD (gastroesophageal reflux disease)    History of interstitial nephritis 2016   chronic   History of kidney stones    has a small one found on xray   History of  nuclear stress test 12/16/2014   Intermediate risk nuclear study w/ medium size moderate severity reversible defect in the basal and mid inferolateral and inferior wall (per dr cardiology note , dr [name] did not think this was consistent with ischemia)/  normal LV function and wall motion, ef 75%   History of septic shock 01/22/2015   in setting Group A Strep Cellulits erysipelas/chest wall induration with streptoccocus basterium-- Severe sepsis, DIC, Acute respiratory failure with pulmonary edema, Acute Kidney failure with chronic interstitial nephritis   Hypertension    Hyponatremia    Migraines    on Zoloft for migraines   Mild carotid artery disease ([place])    per duplex 08-30-2017 bilateral ICA 1-39%   OA (osteoarthritis) rheumotologist-  dr [name]   both knees,  shoulders, ankles   OSA on CPAP     moderate obstructive sleep apnea with an AHI of 23.4/h and oxygen desaturations as low as 84%.  Now on CPAP at 12 cm H2O.   PAF (paroxysmal atrial fibrillation) [facility]) 2009   cardiologist-- dr [name]   Paroxysmal atrial flutter [facility])    a. dx 11/2017.   PSVT (paroxysmal supraventricular tachycardia)    RA (rheumatoid arthritis) ([place])    Wears contact lenses     Past Surgical History:  Procedure Laterality Date   BREAST BIOPSY Right 05/15/2017   [place]   CARDIOVERSION N/A 08/28/2021   Procedure: CARDIOVERSION;  Surgeon: Fay Records, MD;  Location: [facility] ENDOSCOPY;  Service: Cardiovascular;  Laterality: N/A;   [place] N/A 11/16/2021   Procedure: LAPAROSCOPIC CHOLECYSTECTOMY;  Surgeon: Ralene Ok, MD;  Location: [place];  Service: General;  Laterality: N/A;   ESOPHAGOGASTRODUODENOSCOPY N/A 02/08/2015   Procedure: ESOPHAGOGASTRODUODENOSCOPY (EGD);  Surgeon: [name], MD;  Location: [place];  Service: Endoscopy;  Laterality: N/A;   KNEE ARTHROSCOPY W/ MENISCAL REPAIR Left 08/2014    '@[facility]'$    SHOULDER SURGERY Right 04/04/2016   TOTAL  HIP ARTHROPLASTY Right 06/19/2022   Procedure: RIGHT TOTAL HIP ARTHROPLASTY ANTERIOR APPROACH;  Surgeon: Mcarthur Rossetti, MD;  Location: [place];  Service: Orthopedics;  Laterality: Right;   TOTAL KNEE ARTHROPLASTY Right 09/01/2018   Procedure: RIGHT TOTAL KNEE ARTHROPLASTY;  Surgeon: [name], MD;  Location: [facility];  Service: Orthopedics;  Laterality: Right;     Social History:  The patient  reports that she has never smoked. She has never used smokeless tobacco. She reports that she does not currently use alcohol. She reports that she does  not use drugs.   Family History:  The patient's family history includes Allergic rhinitis in her sister; Breast cancer (age of onset: 26) in her sister; Lymphoma in her mother; Pancreatic cancer in her brother; Prostate cancer in her father; Urticaria in her sister.    ROS:  Please see the history of present illness. All other systems are reviewed and  Negative to the above problem except as noted.    PHYSICAL EXAM: VS:  BP (!) 140/80   Pulse 73   Ht '5\' 5"'$  (1.651 m)   Wt 229 lb (103.9 kg)   SpO2 95%   BMI 38.11 kg/m   GEN: Morbidly obese 76 yo  in no acute distress  HEENT: normal  Neck: JVP is normal  Cardiac: RRR; no murmurs,, NO LE  edema  Respiratory:  clear to auscultation bilaterally,  GI: soft, nontender, nondistended, + BS  No hepatomegaly  MS: no deformity Moving all extremities   Skin: warm and dry Neuro:  Grossly intact   Psych: euthymic mood, full affect   EKG:  EKG is  Not ordered today.   Lipid Panel    Component Value Date/Time   CHOL 168 03/06/2022 1143   TRIG 52 03/06/2022 1143   HDL 98 03/06/2022 1143   CHOLHDL 1.7 03/06/2022 1143   CHOLHDL 2 05/28/2018 0837   VLDL 13.8 05/28/2018 0837   LDLCALC 59 03/06/2022 1143      Wt Readings from Last 3 Encounters:  08/02/22 229 lb (103.9 kg)  07/18/22 229 lb 9.6 oz (104.1 kg)  07/13/22 225 lb (102.1 kg)    Echo:   1. Left ventricular ejection fraction, by  estimation, is 55 to 60%. The left ventricle has normal function. The left ventricle has no regional wall motion abnormalities. There is mild concentric left ventricular hypertrophy. Left ventricular diastolic parameters were normal. 2. Right ventricular systolic function is normal. The right ventricular size is normal. There is moderately elevated pulmonary artery systolic pressure. 3. The mitral valve is normal in structure and function. Mild mitral valve regurgitation. No evidence of mitral stenosis. 4. The aortic valve is tricuspid. Aortic valve regurgitation is not visualized. No aortic stenosis is present. 5. The inferior vena cava is normal in size with <50% respiratory variability, suggesting right atrial pressure of 8 mmHg. Comparison(s): 01/25/15 EF 55-60%. PA pressure 25mHg.  ASSESSMENT AND PLAN  1 PAF  Patinet is maintaining SR clinically   One episode after having wine  Converted on own   Keep on same meds      2   HTN   BP up a little  Will follow   Discussed goals   Call if high   3  Hx diastolic dysfunction. Volume appears OK      4   Hx CP    Denies CP   Ca score is 0   Pt has mild plaquing of aorta  Keep on Crestor    6 CV dz Mild plaquing  Keep on statin    7 HL  Keep on Crestor given atherosclerosis of aorta.    Follow up this winter   The pt will return for CBC, CMET          Current medicines are reviewed at length with the patient today.  The patient does not have concerns regarding medicines.  Signed, PDorris Carnes MD  08/02/2022 10:44 PM    [address] [place]  [id number]: [phone] Fax: [phone]   

## 2022-08-02 ENCOUNTER — Encounter: Payer: Self-pay | Admitting: Internal Medicine

## 2022-08-02 ENCOUNTER — Ambulatory Visit: Payer: Medicare Other | Admitting: Psychology

## 2022-08-02 ENCOUNTER — Ambulatory Visit: Payer: Medicare Other | Attending: Internal Medicine | Admitting: Internal Medicine

## 2022-08-02 VITALS — BP 140/80 | HR 73 | Ht 65.0 in | Wt 229.0 lb

## 2022-08-02 DIAGNOSIS — M6281 Muscle weakness (generalized): Secondary | ICD-10-CM | POA: Diagnosis not present

## 2022-08-02 DIAGNOSIS — Z79899 Other long term (current) drug therapy: Secondary | ICD-10-CM | POA: Diagnosis not present

## 2022-08-02 DIAGNOSIS — I700 Atherosclerosis of aorta: Secondary | ICD-10-CM | POA: Insufficient documentation

## 2022-08-02 DIAGNOSIS — M7918 Myalgia, other site: Secondary | ICD-10-CM | POA: Diagnosis not present

## 2022-08-02 DIAGNOSIS — M47817 Spondylosis without myelopathy or radiculopathy, lumbosacral region: Secondary | ICD-10-CM | POA: Diagnosis not present

## 2022-08-02 DIAGNOSIS — M25551 Pain in right hip: Secondary | ICD-10-CM | POA: Diagnosis not present

## 2022-08-02 DIAGNOSIS — I2510 Atherosclerotic heart disease of native coronary artery without angina pectoris: Secondary | ICD-10-CM | POA: Diagnosis not present

## 2022-08-02 NOTE — Patient Instructions (Signed)
Medication Instructions:   *If you need a refill on your cardiac medications before your next appointment, please call your pharmacy*   Lab Work:  CBC WITH DIFF, CMET ON A DAY THAT [place]   If you have labs (blood work) drawn today and your tests are completely normal, you will receive your results only by: [place] (if you have MyChart) OR A paper copy in the mail If you have any lab test that is abnormal or we need to change your treatment, we will call you to review the results.   Testing/Procedures:    Follow-Up: At [facility], you and your health needs are our priority.  As part of our continuing mission to provide you with exceptional heart care, we have created designated Provider Care Teams.  These Care Teams include your primary Cardiologist (physician) and Advanced Practice Providers (APPs -  Physician Assistants and Nurse Practitioners) who all work together to provide you with the care you need, when you need it.  We recommend signing up for the patient portal called "MyChart".  Sign up information is provided on this After Visit Summary.  MyChart is used to connect with patients for Virtual Visits (Telemedicine).  Patients are able to view lab/test results, encounter notes, upcoming appointments, etc.  Non-urgent messages can be sent to your provider as well.   To learn more about what you can do with MyChart, go to [email or web address].    Your next appointment:   6 month(s)  The format for your next appointment:   In Person  Provider:   [name], MD     Other Instructions   Important Information About Sugar

## 2022-08-03 ENCOUNTER — Ambulatory Visit: Payer: Medicare Other | Attending: Internal Medicine

## 2022-08-03 DIAGNOSIS — Z79899 Other long term (current) drug therapy: Secondary | ICD-10-CM | POA: Diagnosis not present

## 2022-08-03 DIAGNOSIS — I700 Atherosclerosis of aorta: Secondary | ICD-10-CM | POA: Diagnosis not present

## 2022-08-04 LAB — CBC WITH DIFFERENTIAL/PLATELET
Basophils Absolute: 0 10*3/uL (ref 0.0–0.2)
Basos: 1 %
EOS (ABSOLUTE): 0.1 10*3/uL (ref 0.0–0.4)
Eos: 2 %
Hematocrit: 34.3 % (ref 34.0–46.6)
Hemoglobin: 11.9 g/dL (ref 11.1–15.9)
Immature Grans (Abs): 0 10*3/uL (ref 0.0–0.1)
Immature Granulocytes: 0 %
Lymphocytes Absolute: 1.4 10*3/uL (ref 0.7–3.1)
Lymphs: 31 %
MCH: 32.7 pg (ref 26.6–33.0)
MCHC: 34.7 g/dL (ref 31.5–35.7)
MCV: 94 fL (ref 79–97)
Monocytes Absolute: 0.3 10*3/uL (ref 0.1–0.9)
Monocytes: 6 %
Neutrophils Absolute: 2.7 10*3/uL (ref 1.4–7.0)
Neutrophils: 60 %
Platelets: 231 10*3/uL (ref 150–450)
RBC: 3.64 x10E6/uL — ABNORMAL LOW (ref 3.77–5.28)
RDW: 12.6 % (ref 11.7–15.4)
WBC: 4.5 10*3/uL (ref 3.4–10.8)

## 2022-08-04 LAB — COMPREHENSIVE METABOLIC PANEL
ALT: 14 IU/L (ref 0–32)
AST: 17 IU/L (ref 0–40)
Albumin/Globulin Ratio: 1.9 (ref 1.2–2.2)
Albumin: 4.6 g/dL (ref 3.8–4.8)
Alkaline Phosphatase: 105 IU/L (ref 44–121)
BUN/Creatinine Ratio: 18 (ref 12–28)
BUN: 17 mg/dL (ref 8–27)
Bilirubin Total: 0.7 mg/dL (ref 0.0–1.2)
CO2: 21 mmol/L (ref 20–29)
Calcium: 9.6 mg/dL (ref 8.7–10.3)
Chloride: 99 mmol/L (ref 96–106)
Creatinine, Ser: 0.92 mg/dL (ref 0.57–1.00)
Globulin, Total: 2.4 g/dL (ref 1.5–4.5)
Glucose: 96 mg/dL (ref 70–99)
Potassium: 4.4 mmol/L (ref 3.5–5.2)
Sodium: 133 mmol/L — ABNORMAL LOW (ref 134–144)
Total Protein: 7 g/dL (ref 6.0–8.5)
eGFR: 65 mL/min/{1.73_m2} (ref >59)

## 2022-08-06 ENCOUNTER — Ambulatory Visit (INDEPENDENT_AMBULATORY_CARE_PROVIDER_SITE_OTHER): Payer: Medicare Other | Admitting: Orthopaedic Surgery

## 2022-08-06 ENCOUNTER — Encounter: Payer: Self-pay | Admitting: Orthopaedic Surgery

## 2022-08-06 DIAGNOSIS — Z96641 Presence of right artificial hip joint: Secondary | ICD-10-CM

## 2022-08-06 NOTE — Progress Notes (Signed)
Promise is now 6 weeks status post a right total hip arthroplasty.  She says she is doing well overall and still working on strength but has good motion.  She denies any issues with her left hip.  She has a history of a left total knee arthroplasty done by one of my colleagues in town.  She is walk without assistive ice.  There is just a little bit of stiffness when she first gets up but on my exam really smoothly and fluidly.  Her left hip is still asymptomatic and moves smoothly and fluidly.  She will continue increase her activities as she tolerates.  The next time I need to see her is not for 6 months unless there are issues.  At that visit we will have a standing loaded pelvis and lateral of her right hip.

## 2022-08-07 DIAGNOSIS — M47817 Spondylosis without myelopathy or radiculopathy, lumbosacral region: Secondary | ICD-10-CM | POA: Diagnosis not present

## 2022-08-07 DIAGNOSIS — M25551 Pain in right hip: Secondary | ICD-10-CM | POA: Diagnosis not present

## 2022-08-07 DIAGNOSIS — M6281 Muscle weakness (generalized): Secondary | ICD-10-CM | POA: Diagnosis not present

## 2022-08-07 DIAGNOSIS — M7918 Myalgia, other site: Secondary | ICD-10-CM | POA: Diagnosis not present

## 2022-08-09 ENCOUNTER — Ambulatory Visit: Payer: Medicare Other | Admitting: Psychology

## 2022-08-14 ENCOUNTER — Ambulatory Visit: Payer: Self-pay | Admitting: Licensed Clinical Social Worker

## 2022-08-14 DIAGNOSIS — M47817 Spondylosis without myelopathy or radiculopathy, lumbosacral region: Secondary | ICD-10-CM | POA: Diagnosis not present

## 2022-08-14 DIAGNOSIS — M7918 Myalgia, other site: Secondary | ICD-10-CM | POA: Diagnosis not present

## 2022-08-14 DIAGNOSIS — M25551 Pain in right hip: Secondary | ICD-10-CM | POA: Diagnosis not present

## 2022-08-14 DIAGNOSIS — M6281 Muscle weakness (generalized): Secondary | ICD-10-CM | POA: Diagnosis not present

## 2022-08-14 NOTE — Patient Instructions (Signed)
Visit Information  Thank you for taking time to visit with me today. Please don't hesitate to contact me if I can be of assistance to you before our next scheduled telephone appointment.  Following are the goals we discussed today:   Our next appointment is by telephone on 09/05/22 at 10:00 AM  Please call the care guide team at [phone] if you need to cancel or reschedule your appointment.   If you are experiencing a Mental Health or [place] or need someone to talk to, please go to [facility] [place] [phone])   Following is a copy of your full plan of care:   Intervention: Called client phone number today but was not able to speak via phone with client.LCSW left phone message for client asking her to call LCSW at [phone]  Ms. Martha was given information about Care Management services by the embedded care coordination team including:  Care Management services include personalized support from designated clinical staff supervised by her physician, including individualized plan of care and coordination with other care providers 24/7 contact phone numbers for assistance for urgent and routine care needs. The patient may stop CCM services at any time (effective at the end of the month) by phone call to the office staff.  Patient agreed to services and verbal consent obtained.   Norva Riffle.Amerie Beaumont MSW, Allison Holiday representative [facility] Care Management [phone]

## 2022-08-14 NOTE — Patient Outreach (Signed)
Care Coordination   08/14/2022 Name: Amanda Wilkins MRN: [id number] DOB: Jul 19, 1946   Care Coordination Outreach Attempts:  An unsuccessful telephone outreach was attempted today to offer the patient information about available care coordination services as a benefit of their health plan.   Follow Up Plan:  Additional outreach attempts will be made to offer the patient care coordination information and services.   Encounter Outcome:  No Answer  Care Coordination Interventions Activated:  No   Care Coordination Interventions:  No, not indicated    Kelton Pillar.Malosi Hemstreet MSW, LCSW Licensed Visual merchandiser [facility] Care Management [phone]

## 2022-08-16 ENCOUNTER — Ambulatory Visit: Payer: Medicare Other | Admitting: Psychology

## 2022-08-16 DIAGNOSIS — M47817 Spondylosis without myelopathy or radiculopathy, lumbosacral region: Secondary | ICD-10-CM | POA: Diagnosis not present

## 2022-08-16 DIAGNOSIS — M7918 Myalgia, other site: Secondary | ICD-10-CM | POA: Diagnosis not present

## 2022-08-16 DIAGNOSIS — M6281 Muscle weakness (generalized): Secondary | ICD-10-CM | POA: Diagnosis not present

## 2022-08-16 DIAGNOSIS — M25551 Pain in right hip: Secondary | ICD-10-CM | POA: Diagnosis not present

## 2022-08-20 ENCOUNTER — Encounter (HOSPITAL_COMMUNITY): Payer: Self-pay | Admitting: Emergency Medicine

## 2022-08-20 ENCOUNTER — Emergency Department (HOSPITAL_COMMUNITY)
Admission: EM | Admit: 2022-08-20 | Discharge: 2022-08-20 | Discharge status: Against Medical Advice | Payer: Medicare Other | Attending: Emergency Medicine | Admitting: Emergency Medicine

## 2022-08-20 ENCOUNTER — Emergency Department (HOSPITAL_COMMUNITY): Payer: Medicare Other

## 2022-08-20 DIAGNOSIS — R0602 Shortness of breath: Secondary | ICD-10-CM | POA: Diagnosis not present

## 2022-08-20 DIAGNOSIS — R42 Dizziness and giddiness: Secondary | ICD-10-CM | POA: Insufficient documentation

## 2022-08-20 DIAGNOSIS — Z20822 Contact with and (suspected) exposure to covid-19: Secondary | ICD-10-CM | POA: Diagnosis not present

## 2022-08-20 DIAGNOSIS — Z5321 Procedure and treatment not carried out due to patient leaving prior to being seen by health care provider: Secondary | ICD-10-CM | POA: Diagnosis not present

## 2022-08-20 LAB — CBC WITH DIFFERENTIAL/PLATELET
Abs Immature Granulocytes: 0.01 10*3/uL (ref 0.00–0.07)
Basophils Absolute: 0.1 10*3/uL (ref 0.0–0.1)
Basophils Relative: 1 %
Eosinophils Absolute: 0.2 10*3/uL (ref 0.0–0.5)
Eosinophils Relative: 3 %
HCT: 37.6 % (ref 36.0–46.0)
Hemoglobin: 12.6 g/dL (ref 12.0–15.0)
Immature Granulocytes: 0 %
Lymphocytes Relative: 29 %
Lymphs Abs: 1.6 10*3/uL (ref 0.7–4.0)
MCH: 32.1 pg (ref 26.0–34.0)
MCHC: 33.5 g/dL (ref 30.0–36.0)
MCV: 95.9 fL (ref 80.0–100.0)
Monocytes Absolute: 0.3 10*3/uL (ref 0.1–1.0)
Monocytes Relative: 6 %
Neutro Abs: 3.4 10*3/uL (ref 1.7–7.7)
Neutrophils Relative %: 61 %
Platelets: 171 10*3/uL (ref 150–400)
RBC: 3.92 MIL/uL (ref 3.87–5.11)
RDW: 12.7 % (ref 11.5–15.5)
WBC: 5.6 10*3/uL (ref 4.0–10.5)
nRBC: 0 % (ref 0.0–0.2)

## 2022-08-20 LAB — COMPREHENSIVE METABOLIC PANEL
ALT: 19 U/L (ref 0–44)
AST: 26 U/L (ref 15–41)
Albumin: 4.1 g/dL (ref 3.5–5.0)
Alkaline Phosphatase: 84 U/L (ref 38–126)
Anion gap: 15 (ref 5–15)
BUN: 13 mg/dL (ref 8–23)
CO2: 19 mmol/L — ABNORMAL LOW (ref 22–32)
Calcium: 9.7 mg/dL (ref 8.9–10.3)
Chloride: 100 mmol/L (ref 98–111)
Creatinine, Ser: 0.95 mg/dL (ref 0.44–1.00)
GFR, Estimated: >60 mL/min (ref >60)
Glucose, Bld: 92 mg/dL (ref 70–99)
Potassium: 4.7 mmol/L (ref 3.5–5.1)
Sodium: 134 mmol/L — ABNORMAL LOW (ref 135–145)
Total Bilirubin: 0.9 mg/dL (ref 0.3–1.2)
Total Protein: 7.2 g/dL (ref 6.5–8.1)

## 2022-08-20 LAB — SARS CORONAVIRUS 2 BY RT PCR: SARS Coronavirus 2 by RT PCR: NEGATIVE

## 2022-08-20 LAB — TROPONIN I (HIGH SENSITIVITY)
Troponin I (High Sensitivity): 7 ng/L (ref <18)
Troponin I (High Sensitivity): 7 ng/L (ref <18)

## 2022-08-20 MED ORDER — ACETAMINOPHEN 325 MG PO TABS
650.0000 mg | ORAL_TABLET | Freq: Once | ORAL | Status: DC
  Filled 2022-08-20: qty 2

## 2022-08-20 NOTE — ED Notes (Signed)
Pt notified staff that she is leaving and will follow up with her dr

## 2022-08-20 NOTE — ED Notes (Signed)
X1 vitals recheck with no response

## 2022-08-20 NOTE — ED Triage Notes (Signed)
Pt here from home with c/o sob since Friday , got worse today while shopping, no chest pain or fevers ,

## 2022-08-20 NOTE — ED Provider Triage Note (Signed)
Emergency Medicine Provider Triage Evaluation Note  Amanda Wilkins , a 76 y.o. female  was evaluated in triage.  Pt complains of shortness of breathxseveral days, worse with ambulation. Difficulty ambulating at all today, w/o getting out of breath. Gets lightheaded. No chest pain. No tobacco hx. No lung issues. No URI symptoms. No recent trauma.  No swelling of extremities. Last surgery 2 months ago.   Review of Systems  Positive: sob Negative: Chest pain, URI sx  Physical Exam  BP (!) 148/66 (BP Location: Right Arm)   Pulse 66   Temp 98.6 F (37 C) (Oral)   Resp 16   SpO2 100%  Gen:   Awake, no distress   Resp:  Normal effort  MSK:   Moves extremities without difficulty   Medical Decision Making  Medically screening exam initiated at 12:47 PM.  Appropriate orders placed.  Amanda Wilkins was informed that the remainder of the evaluation will be completed by another provider, this initial triage assessment does not replace that evaluation, and the importance of remaining in the ED until their evaluation is complete.  PERC negative.    Osvaldo Shipper, [place] 08/20/22 1301

## 2022-08-21 NOTE — Progress Notes (Unsigned)
Office Visit    Patient Name: Amanda Wilkins Date of Encounter: 08/21/2022  Primary Care Provider:  [name], MD Primary Cardiologist:  [name], MD Primary Electrophysiologist: None  Chief Complaint    Amanda Wilkins is a 76 y.o. female with PMH of paroxysmal AF/flutter (on Eliquis), HTN, obesity, chronic interstitial nephritis, OSA (on CPAP), PSVT, mild carotid disease (1-39%), HLD, aortic atherosclerosis, mild MR, right total hip arthroplasty who presents today for shortness of breath with ambulation.  Past Medical History    Past Medical History:  Diagnosis Date   Acute bronchitis 09/11/2016   11/17 refractory   Acute cystitis without hematuria 05/17/2015   Acute kidney injury [facility])    Labs today   Acute right ankle pain 09/09/2018   Anticoagulant long-term use    Xarelto   Axillary pain    Bronchitis    Cellulitis    Cholecystitis    Chronic interstitial nephritis    CONJUNCTIVITIS, ACUTE 10/18/2010   Qualifier: Diagnosis of  By: Plotnikov MD, Evie Lacks    D-dimer, elevated    Depression    Eye inflammation    GERD (gastroesophageal reflux disease)    History of interstitial nephritis 2016   chronic   History of kidney stones    has a small one found on xray   History of nuclear stress test 12/16/2014   Intermediate risk nuclear study w/ medium size moderate severity reversible defect in the basal and mid inferolateral and inferior wall (per dr cardiology note , dr [name] did not think this was consistent with ischemia)/  normal LV function and wall motion, ef 75%   History of septic shock 01/22/2015   in setting Group A Strep Cellulits erysipelas/chest wall induration with streptoccocus basterium-- Severe sepsis, DIC, Acute respiratory failure with pulmonary edema, Acute Kidney failure with chronic interstitial nephritis   Hypertension    Hyponatremia    Migraines    on Zoloft for migraines   Mild carotid artery disease ([place])     per duplex 08-30-2017 bilateral ICA 1-39%   OA (osteoarthritis) rheumotologist-  dr [name]   both knees,  shoulders, ankles   OSA on CPAP     moderate obstructive sleep apnea with an AHI of 23.4/h and oxygen desaturations as low as 84%.  Now on CPAP at 12 cm H2O.   PAF (paroxysmal atrial fibrillation) [facility]) 2009   cardiologist-- dr [name]   Paroxysmal atrial flutter [facility])    a. dx 11/2017.   PSVT (paroxysmal supraventricular tachycardia)    RA (rheumatoid arthritis) ([place])    Wears contact lenses    Past Surgical History:  Procedure Laterality Date   BREAST BIOPSY Right 05/15/2017   [place]   CARDIOVERSION N/A 08/28/2021   Procedure: CARDIOVERSION;  Surgeon: Fay Records, MD;  Location: [facility] ENDOSCOPY;  Service: Cardiovascular;  Laterality: N/A;   [place] N/A 11/16/2021   Procedure: LAPAROSCOPIC CHOLECYSTECTOMY;  Surgeon: Ralene Ok, MD;  Location: [place];  Service: General;  Laterality: N/A;   ESOPHAGOGASTRODUODENOSCOPY N/A 02/08/2015   Procedure: ESOPHAGOGASTRODUODENOSCOPY (EGD);  Surgeon: [name], MD;  Location: [place];  Service: Endoscopy;  Laterality: N/A;   KNEE ARTHROSCOPY W/ MENISCAL REPAIR Left 08/2014    '@[email or web address]'$    SHOULDER SURGERY Right 04/04/2016   TOTAL HIP ARTHROPLASTY Right 06/19/2022   Procedure: RIGHT TOTAL HIP ARTHROPLASTY ANTERIOR APPROACH;  Surgeon: Mcarthur Rossetti, MD;  Location: [place];  Service: Orthopedics;  Laterality: Right;  TOTAL KNEE ARTHROPLASTY Right 09/01/2018   Procedure: RIGHT TOTAL KNEE ARTHROPLASTY;  Surgeon: [name], MD;  Location: [facility];  Service: Orthopedics;  Laterality: Right;    Allergies  Allergies  Allergen Reactions   Epinephrine Base Other (See Comments)    Seriously increases heart rate   Aspirin Other (See Comments)    Can take the coated 325 mg. Plain asa 325 mg speeds up the heart.   Benadryl [Diphenhydramine]     Increased heart rate   Covid-19 Ad26  Vaccine(Janssen) Hives     hives 6 hrs after her COVID 19 2nd booster - resolved    Fish Allergy Hives    Was imitation crab meat and broke out in hives, had skin testing for lobster and showed allergic    Hylan G-F 20 Other (See Comments)    Very painful (knee injection)   Penicillins Other (See Comments)    Pt previously has been told not to take because of family history of reactions (water blisters). She took amoxicillin in 2012-2013 with no reaction Pt received ancef on 11-16-2021 without issue   Tape Other (See Comments)    Blisters from adhesive tape   Wound Dressing Adhesive Other (See Comments)    Blisters from adhesive tape    History of Present Illness    Amanda Wilkins  is a 76 year old female with the above mention past medical history who presents today for complaint of shortness of breath with ambulation.  She was initially seen by Dr. [name] in 2014 after being followed by Dr. Verl Blalock for management of atrial fibrillation.  She had 2D echo completed 01/2015 that was normal with a EF of 50-60% and no RWMA or valvular abnormalities.  Myoview was also completed 12/2014 with moderate defect at base mid inferior lateral and inferior wall consistent with ischemia.  She wore a Holter monitor in January 2018 that showed A-fib and SVT.  She was recommended to start flecainide 50 mg twice daily however patient declined due to possible side effects.  She was noted to have mild plaque by carotid Doppler bilaterally 08/2017.  She was advised to titrate atenolol to 25 mg twice daily.  In February 2019 she presented with new onset atrial flutter and spontaneously converted to sinus rhythm.  She has sleep study obtain 01/2018 that indicated obstructive sleep apnea and was referred to Dr. Radford Pax for management.  She was started on CPAP with titration noted.  She underwent cardiac CT 06/2019 that showed calcium score 0.  She was seen in the office by Robbie Lis, PA on 11/2019 with complaint of dyspnea on  exertion while riding her bike.  BNP was completed that was elevated at 433 and patient was recommended to use Lasix 3 times per week.  Repeat 2D echo was also completed that showed normal EF and mild MR with RVSP estimated at 47 mmHg.  She continues to complain of shortness of breath with exertion and chest x-ray was completed that showed no abnormalities.  She was seen by Dr. [name] 04/2021 for tachypalpitations that converted on their own and had complaint of dizziness with atenolol and was decreased to 25 mg twice daily.    She was admitted on 08/20/2021 for evaluation of atrial fibrillation.  She had increase atenolol to 50 mg twice daily with plan to start Multaq however was not affordable at that time.  She declined pharmacological Myoview at that time.  She was started on flecainide with plan to undergo outpatient DCCV.  She underwent procedure 08/2021 with conversion to sinus rhythm however reverted back to A-fib before discharge.  Flecainide was increased to 75 mg twice daily.  She was last seen by Dr. [name] on 07/2022 for follow-up and was doing well 1 episode of A-fib lasting 4 hours.  She was currently in PT for hip replacement.  She was continuing to maintain sinus rhythm blood pressure was slightly elevated and plan to continue to follow at that time.  She was euvolemic from a volume standpoint.   Amanda Wilkins presents today for post ED follow-up.  Since last being seen in the office patient reports she is doing much better and has no complaints of shortness of breath at this time.  She reports that last week she was walking from her car into [employer] when she developed shortness of breath and needed to use a shopping cart for support.  She also had complaints of flip-flopping of her heart in her chest.  During today's visit she was sinus rhythm on EKG and denied any palpitations.  She reports that over the past few weeks she has been eating lots of licorice and chocolate.  We discussed the importance  of abstaining from triggers for atrial fibrillation such as alcohol, caffeine, and energy drinks. Patient denies chest pain, palpitations, dyspnea, PND, orthopnea, nausea, vomiting, dizziness, syncope, edema, weight gain, or early satiety.    Home Medications    Current Outpatient Medications  Medication Sig Dispense Refill   acetaminophen (TYLENOL) 500 MG tablet Take 1,000 mg by mouth daily as needed for moderate pain or mild pain (pain).     ALPRAZolam (XANAX) 0.25 MG tablet Take 1 tablet (0.25 mg total) by mouth 2 (two) times daily as needed for anxiety. 60 tablet 3   amLODipine (NORVASC) 2.5 MG tablet Take 0.5 tablets (1.25 mg total) by mouth daily. 90 tablet 3   apixaban (ELIQUIS) 5 MG TABS tablet TAKE 1 TABLET(5 MG) BY MOUTH TWICE DAILY 60 tablet 5   cholecalciferol (VITAMIN D3) 25 MCG (1000 UT) tablet Take 1,000 Units by mouth daily.     EPINEPHrine 0.3 mg/0.3 mL IJ SOAJ injection Inject 0.3 mg into the muscle as needed for anaphylaxis. As needed for life-threatening allergic reactions 2 each 1   famotidine (PEPCID) 40 MG tablet Take 1 tablet (40 mg total) by mouth daily. 90 tablet 3   flecainide (TAMBOCOR) 50 MG tablet Take 1.5 tablets (75 mg total) by mouth 2 (two) times daily. 135 tablet 3   HYDROcodone-acetaminophen (NORCO) 10-325 MG tablet Take 1 tablet by mouth every 6 (six) hours as needed for severe pain. 100 tablet 0   hydroxychloroquine (PLAQUENIL) 200 MG tablet Take 400 mg by mouth every morning.     metoprolol tartrate (LOPRESSOR) 25 MG tablet Take 1 tablet (25 mg total) by mouth 2 (two) times daily. 180 tablet 3   pantoprazole (PROTONIX) 40 MG tablet Take 1 tablet (40 mg total) by mouth daily. (Patient taking differently: Take 40 mg by mouth daily as needed (Acid reflux).) 30 tablet 0   Probiotic Product (ALIGN) 4 MG CAPS Take 4 mg by mouth daily.     rosuvastatin (CRESTOR) 5 MG tablet Take 1 tablet (5 mg total) by mouth daily. 90 tablet 3   sertraline (ZOLOFT) 50 MG tablet  TAKE 1 TABLET BY MOUTH DAILY 90 tablet 1   No current facility-administered medications for this visit.     Review of Systems  Please see the history of present illness.  All other systems reviewed and are otherwise negative except as noted above.  Physical Exam    Wt Readings from Last 3 Encounters:  08/02/22 229 lb (103.9 kg)  07/18/22 229 lb 9.6 oz (104.1 kg)  07/13/22 225 lb (102.1 kg)   KD:TOIZT were no vitals filed for this visit.,There is no height or weight on file to calculate BMI.  Constitutional:      Appearance: Healthy appearance. Not in distress.  Neck:     Vascular: JVD normal.  Pulmonary:     Effort: Pulmonary effort is normal.     Breath sounds: No wheezing. No rales. Diminished in the bases Cardiovascular:     Normal rate. Regular rhythm. Normal S1. Normal S2.      Murmurs: There is no murmur.  Edema:    Peripheral edema absent.  Abdominal:     Palpations: Abdomen is soft non tender. There is no hepatomegaly.  Skin:    General: Skin is warm and dry.  Neurological:     General: No focal deficit present.     Mental Status: Alert and oriented to person, place and time.     Cranial Nerves: Cranial nerves are intact.  EKG/LABS/Other Studies Reviewed    ECG personally reviewed by me today -sinus rhythm with sinus arrhythmia and first-degree AV block with no acute changes consistent with previous EKG.  Risk Assessment/Calculations:    CHA2DS2-VASc Score = 6   This indicates a 9.7% annual risk of stroke. The patient's score is based upon: CHF History: 1 HTN History: 1 Diabetes History: 0 Stroke History: 0 Vascular Disease History: 1 Age Score: 2 Gender Score: 1           Lab Results  Component Value Date   WBC 5.6 08/20/2022   HGB 12.6 08/20/2022   HCT 37.6 08/20/2022   MCV 95.9 08/20/2022   PLT 171 08/20/2022   Lab Results  Component Value Date   CREATININE 0.95 08/20/2022   BUN 13 08/20/2022   NA 134 (L) 08/20/2022   K 4.7  08/20/2022   CL 100 08/20/2022   CO2 19 (L) 08/20/2022   Lab Results  Component Value Date   ALT 19 08/20/2022   AST 26 08/20/2022   ALKPHOS 84 08/20/2022   BILITOT 0.9 08/20/2022   Lab Results  Component Value Date   CHOL 168 03/06/2022   HDL 98 03/06/2022   LDLCALC 59 03/06/2022   TRIG 52 03/06/2022   CHOLHDL 1.7 03/06/2022    Lab Results  Component Value Date   HGBA1C 5.8 (H) 01/30/2015    Assessment & Plan    1.  Paroxysmal AF: -Patient is currently on flecainide 75 mg twice daily and metoprolol 25 mg twice daily -Today she is sinus rhythm and rate controlled -She was instructed to take an extra 25 mg of metoprolol for breakthrough palpitations. -She is tolerating Eliquis 5 mg twice daily no complaints of bleeding -CHA2DS2-VASc Score = 6 [CHF History: 1, HTN History: 1, Diabetes History: 0, Stroke History: 0, Vascular Disease History: 1, Age Score: 2, Gender Score: 1].  Therefore, the patient's annual risk of stroke is 9.7 %.      2.  History of atypical chest pain: -Cardiac CT completed with calcium score of 0 and mild plaquing of the aorta -Continue statin as prescribed and encouraged to report any complaints of angina  3.  Hyperlipidemia: -Patient's last LDL was 59 -Continue Crestor 5 mg daily  4.  Essential hypertension: -Patient's blood pressure today  was controlled at 124/68 -Continue amlodipine 2.5 mg daily and metoprolol as noted above  5.  Dyspnea on exertion: -Patient reports resolution of shortness of breath -We will check a BNP today to rule out any volume or pulmonary related causes  Disposition: Follow-up with [name], MD or APP in 6 months   Medication Adjustments/Labs and Tests Ordered: Current medicines are reviewed at length with the patient today.  Concerns regarding medicines are outlined above.   Signed, Mable Fill, Marissa Nestle, NP 08/21/2022, 8:58 AM [facility]  Note:  This document was prepared using  Dragon voice recognition software and may include unintentional dictation errors.

## 2022-08-22 ENCOUNTER — Ambulatory Visit: Payer: Medicare Other | Admitting: Physician Assistant

## 2022-08-22 ENCOUNTER — Encounter: Payer: Self-pay | Admitting: Internal Medicine

## 2022-08-22 DIAGNOSIS — L819 Disorder of pigmentation, unspecified: Secondary | ICD-10-CM | POA: Diagnosis not present

## 2022-08-22 DIAGNOSIS — L821 Other seborrheic keratosis: Secondary | ICD-10-CM | POA: Diagnosis not present

## 2022-08-22 DIAGNOSIS — L565 Disseminated superficial actinic porokeratosis (DSAP): Secondary | ICD-10-CM | POA: Diagnosis not present

## 2022-08-22 DIAGNOSIS — D692 Other nonthrombocytopenic purpura: Secondary | ICD-10-CM | POA: Diagnosis not present

## 2022-08-23 ENCOUNTER — Encounter: Payer: Self-pay | Admitting: Nurse Practitioner

## 2022-08-23 ENCOUNTER — Telehealth: Payer: Self-pay

## 2022-08-23 ENCOUNTER — Ambulatory Visit: Payer: Medicare Other | Attending: Physician Assistant | Admitting: Nurse Practitioner

## 2022-08-23 ENCOUNTER — Ambulatory Visit: Payer: Medicare Other | Admitting: Psychology

## 2022-08-23 VITALS — BP 124/68 | HR 66 | Ht 65.0 in | Wt 230.0 lb

## 2022-08-23 DIAGNOSIS — R0789 Other chest pain: Secondary | ICD-10-CM | POA: Insufficient documentation

## 2022-08-23 DIAGNOSIS — E785 Hyperlipidemia, unspecified: Secondary | ICD-10-CM | POA: Diagnosis not present

## 2022-08-23 DIAGNOSIS — R0609 Other forms of dyspnea: Secondary | ICD-10-CM | POA: Diagnosis not present

## 2022-08-23 DIAGNOSIS — I10 Essential (primary) hypertension: Secondary | ICD-10-CM | POA: Diagnosis not present

## 2022-08-23 DIAGNOSIS — I4819 Other persistent atrial fibrillation: Secondary | ICD-10-CM | POA: Insufficient documentation

## 2022-08-23 MED ORDER — METOPROLOL TARTRATE 25 MG PO TABS: 25.0000 mg | ORAL_TABLET | Freq: Two times a day (BID) | ORAL | 3 refills | Status: DC

## 2022-08-23 NOTE — Telephone Encounter (Signed)
Terri faxed

## 2022-08-23 NOTE — Telephone Encounter (Signed)
Amanda Wilkins with Dr. Vernard Gambles' office (dentist) called to see if the patient can have her teeth cleaned (there now) or does she need antibiotics first? She is just 2 months out from hip replacement, so per Dr. Trevor Mace protocol, she does need antibiotics. This protocol is being faxed to their office [phone].   She will need an Rx for antibiotics. Allergic to Penicillin. Uses [facility] on [address].

## 2022-08-23 NOTE — Telephone Encounter (Signed)
Emailed Dr. Trevor Mace dental prophylaxis protocol to Dr. Vernard Gambles' office (faxes would not go through) - [email or web address]. See previous phone message from this afternoon.

## 2022-08-23 NOTE — Patient Instructions (Addendum)
Medication Instructions:  You can take an additional tablet of Lopressor as needed for palpitations  *If you need a refill on your cardiac medications before your next appointment, please call your pharmacy*   Lab Work: Today-BNP If you have labs (blood work) drawn today and your tests are completely normal, you will receive your results only by: [place] (if you have MyChart) OR A paper copy in the mail If you have any lab test that is abnormal or we need to change your treatment, we will call you to review the results.   Testing/Procedures: NONE ORDERED   Follow-Up: At [facility], you and your health needs are our priority.  As part of our continuing mission to provide you with exceptional heart care, we have created designated Provider Care Teams.  These Care Teams include your primary Cardiologist (physician) and Advanced Practice Providers (APPs -  Physician Assistants and Nurse Practitioners) who all work together to provide you with the care you need, when you need it.  We recommend signing up for the patient portal called "MyChart".  Sign up information is provided on this After Visit Summary.  MyChart is used to connect with patients for Virtual Visits (Telemedicine).  Patients are able to view lab/test results, encounter notes, upcoming appointments, etc.  Non-urgent messages can be sent to your provider as well.   To learn more about what you can do with MyChart, go to [email or web address].    Your next appointment:   6 month(s)  The format for your next appointment:   In Person  Provider:   [name], MD     Other Instructions   Important Information About Sugar

## 2022-08-24 ENCOUNTER — Encounter: Payer: Self-pay | Admitting: Internal Medicine

## 2022-08-24 ENCOUNTER — Other Ambulatory Visit: Payer: Self-pay | Admitting: *Deleted

## 2022-08-24 ENCOUNTER — Ambulatory Visit: Payer: Medicare Other | Attending: Nurse Practitioner

## 2022-08-24 ENCOUNTER — Other Ambulatory Visit: Payer: Self-pay

## 2022-08-24 DIAGNOSIS — R0609 Other forms of dyspnea: Secondary | ICD-10-CM | POA: Diagnosis not present

## 2022-08-24 MED ORDER — FLECAINIDE ACETATE 50 MG PO TABS: 75.0000 mg | ORAL_TABLET | Freq: Two times a day (BID) | ORAL | 3 refills | Status: DC

## 2022-08-25 LAB — PRO B NATRIURETIC PEPTIDE: NT-Pro BNP: 325 pg/mL (ref 0–738)

## 2022-08-28 DIAGNOSIS — M7918 Myalgia, other site: Secondary | ICD-10-CM | POA: Diagnosis not present

## 2022-08-28 DIAGNOSIS — M25551 Pain in right hip: Secondary | ICD-10-CM | POA: Diagnosis not present

## 2022-08-28 DIAGNOSIS — M47817 Spondylosis without myelopathy or radiculopathy, lumbosacral region: Secondary | ICD-10-CM | POA: Diagnosis not present

## 2022-08-28 DIAGNOSIS — M6281 Muscle weakness (generalized): Secondary | ICD-10-CM | POA: Diagnosis not present

## 2022-08-29 NOTE — Progress Notes (Unsigned)
Cardiology Office Note   Date:  08/30/2022   ID:  Amanda Wilkins, DOB [birth year], MRN [id number]  PCP:  [name], MD  Cardiologist:   [name], MD    F/u of PAF     History of Present Illness: Amanda Wilkins is a 76 y.o. female with a history of chest pain, near syncope  and PAF  Echo in 2016 showed normal LVEF Carotid USN showed mild plaquing   Holter showed intermitt afib/SVT (first Dx 2010)  Recomm flecanide; pt declined Rx Myovue showed inferolateral and inferior defect consistent with ischemia  CT calcium score 2020 was 0 PFTs done for dyspnea were normal PT started on CPAP in 2019  Echo in 2021 normal LVEF /RVEF CPX done in Aug 2021.  Limitation felt due to body weight  Pt enrolled into [employer] and felt much better with no signif dyspnea  First documented afib was in Nov 2022 when she was admitted for afib wtth RVR  Started on Flecanide.  Set up for outpt cardioversion She had this done on 08/28/21   Converted to SR but reverted back to afib before d/c from the endo unit.    Flecanide was increased to 75 bid    plan for cardioversoin  She converted on own with increased dose           I saw the pt in Oct 2023  She was seen in ER for SOB and weakness   Says she was shopping at [facility], developed SOB and weak sensation.   Felt palpitations     Seen by Elvin So in follow up   In SR at time   Reviewed triggers  Since seen she has not had any more afib    Has occasional palpitations   Has taken extra metoprolol for this   Yesterday took 4     No outpatient medications have been marked as taking for the 08/30/22 encounter (Office Visit) with Fay Records, MD.     Allergies:   Epinephrine base, Aspirin, Benadryl [diphenhydramine], Covid-19 ad26 vaccine(janssen), Fish allergy, Hylan g-f 20, Penicillins, Tape, and Wound dressing adhesive   Past Medical History:  Diagnosis Date   Acute bronchitis 09/11/2016   11/17 refractory   Acute cystitis without  hematuria 05/17/2015   Acute kidney injury [facility])    Labs today   Acute right ankle pain 09/09/2018   Anticoagulant long-term use    Xarelto   Axillary pain    Bronchitis    Cellulitis    Cholecystitis    Chronic interstitial nephritis    CONJUNCTIVITIS, ACUTE 10/18/2010   Qualifier: Diagnosis of  By: Plotnikov MD, Evie Lacks    D-dimer, elevated    Depression    Eye inflammation    GERD (gastroesophageal reflux disease)    History of interstitial nephritis 2016   chronic   History of kidney stones    has a small one found on xray   History of nuclear stress test 12/16/2014   Intermediate risk nuclear study w/ medium size moderate severity reversible defect in the basal and mid inferolateral and inferior wall (per dr cardiology note , dr [name] did not think this was consistent with ischemia)/  normal LV function and wall motion, ef 75%   History of septic shock 01/22/2015   in setting Group A Strep Cellulits erysipelas/chest wall induration with streptoccocus basterium-- Severe sepsis, DIC, Acute respiratory failure with pulmonary edema, Acute Kidney failure with chronic interstitial nephritis   Hypertension  Hyponatremia    Migraines    on Zoloft for migraines   Mild carotid artery disease ([place])    per duplex 08-30-2017 bilateral ICA 1-39%   OA (osteoarthritis) rheumotologist-  dr [name]   both knees,  shoulders, ankles   OSA on CPAP     moderate obstructive sleep apnea with an AHI of 23.4/h and oxygen desaturations as low as 84%.  Now on CPAP at 12 cm H2O.   PAF (paroxysmal atrial fibrillation) [facility]) 2009   cardiologist-- dr [name]   Paroxysmal atrial flutter [facility])    a. dx 11/2017.   PSVT (paroxysmal supraventricular tachycardia)    RA (rheumatoid arthritis) ([place])    Wears contact lenses     Past Surgical History:  Procedure Laterality Date   BREAST BIOPSY Right 05/15/2017   [place]   CARDIOVERSION N/A 08/28/2021   Procedure: CARDIOVERSION;  Surgeon:  Fay Records, MD;  Location: [facility] ENDOSCOPY;  Service: Cardiovascular;  Laterality: N/A;   [place] N/A 11/16/2021   Procedure: LAPAROSCOPIC CHOLECYSTECTOMY;  Surgeon: Ralene Ok, MD;  Location: [place];  Service: General;  Laterality: N/A;   ESOPHAGOGASTRODUODENOSCOPY N/A 02/08/2015   Procedure: ESOPHAGOGASTRODUODENOSCOPY (EGD);  Surgeon: [name], MD;  Location: [place];  Service: Endoscopy;  Laterality: N/A;   KNEE ARTHROSCOPY W/ MENISCAL REPAIR Left 08/2014    '@[facility]'$    SHOULDER SURGERY Right 04/04/2016   TOTAL HIP ARTHROPLASTY Right 06/19/2022   Procedure: RIGHT TOTAL HIP ARTHROPLASTY ANTERIOR APPROACH;  Surgeon: Mcarthur Rossetti, MD;  Location: [place];  Service: Orthopedics;  Laterality: Right;   TOTAL KNEE ARTHROPLASTY Right 09/01/2018   Procedure: RIGHT TOTAL KNEE ARTHROPLASTY;  Surgeon: [name], MD;  Location: [facility];  Service: Orthopedics;  Laterality: Right;     Social History:  The patient  reports that she has never smoked. She has never used smokeless tobacco. She reports that she does not currently use alcohol. She reports that she does not use drugs.   Family History:  The patient's family history includes Allergic rhinitis in her sister; Breast cancer (age of onset: 70) in her sister; Lymphoma in her mother; Pancreatic cancer in her brother; Prostate cancer in her father; Urticaria in her sister.    ROS:  Please see the history of present illness. All other systems are reviewed and  Negative to the above problem except as noted.    PHYSICAL EXAM: VS:  BP 122/62   Pulse 64   Ht '5\' 5"'$  (1.651 m)   Wt 230 lb 9.6 oz (104.6 kg)   SpO2 98%   BMI 38.37 kg/m   GEN: Morbidly obese 76 yo  in no acute distress  HEENT: normal  Neck: JVP is normal  Cardiac: RRR; no murmurs,, No  LE  edema  Respiratory:  clear to auscultation bilaterally, GI: soft, nontender, nondistended, + BS  No hepatomegaly  MS: no deformity Moving all  extremities   Skin: warm and dry Neuro:  Grossly intact   Psych: euthymic mood, full affect   EKG:  EKG is not ordered today.   Lipid Panel    Component Value Date/Time   CHOL 168 03/06/2022 1143   TRIG 52 03/06/2022 1143   HDL 98 03/06/2022 1143   CHOLHDL 1.7 03/06/2022 1143   CHOLHDL 2 05/28/2018 0837   VLDL 13.8 05/28/2018 0837   LDLCALC 59 03/06/2022 1143      Wt Readings from Last 3 Encounters:  08/30/22 230 lb 9.6 oz (  104.6 kg)  08/23/22 230 lb (104.3 kg)  08/02/22 229 lb (103.9 kg)    Echo:   1. Left ventricular ejection fraction, by estimation, is 55 to 60%. The left ventricle has normal function. The left ventricle has no regional wall motion abnormalities. There is mild concentric left ventricular hypertrophy. Left ventricular diastolic parameters were normal. 2. Right ventricular systolic function is normal. The right ventricular size is normal. There is moderately elevated pulmonary artery systolic pressure. 3. The mitral valve is normal in structure and function. Mild mitral valve regurgitation. No evidence of mitral stenosis. 4. The aortic valve is tricuspid. Aortic valve regurgitation is not visualized. No aortic stenosis is present. 5. The inferior vena cava is normal in size with <50% respiratory variability, suggesting right atrial pressure of 8 mmHg. Comparison(s): 01/25/15 EF 55-60%. PA pressure 25mHg.  ASSESSMENT AND PLAN  1 PAF  Pt is having some ectopy, not clearly afib    I would recomm increasing metoprolol to 25 mg q 6 hours   Stop amlodipine   FOllow BP and symtpoms     Can consolidated to 50 bid    2   HTN   BP is controlled   Follow   3  Hx diastolic dysfunction. Volume is OK   4   Hx CP    Denies CP   Ca score is 0   Pt has mild plaquing of aorta  Keep on Crestor    6 CV dz Mild plaquing  Continjue statin    7 HL  Keep on Crestor given atherosclerosis of aorta.    LDL 59  HDL 98    Follow up next spring           Current  medicines are reviewed at length with the patient today.  The patient does not have concerns regarding medicines.  Signed, PDorris Carnes MD  08/30/2022 9:56 PM    [address] [place] [place]  [id number]: [phone] Fax: [phone]

## 2022-08-30 ENCOUNTER — Encounter: Payer: Self-pay | Admitting: Internal Medicine

## 2022-08-30 ENCOUNTER — Ambulatory Visit: Payer: Medicare Other | Attending: Internal Medicine | Admitting: Internal Medicine

## 2022-08-30 ENCOUNTER — Ambulatory Visit: Payer: Medicare Other | Admitting: Psychology

## 2022-08-30 VITALS — BP 122/62 | HR 64 | Ht 65.0 in | Wt 230.6 lb

## 2022-08-30 DIAGNOSIS — I480 Paroxysmal atrial fibrillation: Secondary | ICD-10-CM | POA: Diagnosis not present

## 2022-08-30 DIAGNOSIS — M6281 Muscle weakness (generalized): Secondary | ICD-10-CM | POA: Diagnosis not present

## 2022-08-30 DIAGNOSIS — I2510 Atherosclerotic heart disease of native coronary artery without angina pectoris: Secondary | ICD-10-CM | POA: Diagnosis not present

## 2022-08-30 DIAGNOSIS — M47817 Spondylosis without myelopathy or radiculopathy, lumbosacral region: Secondary | ICD-10-CM | POA: Diagnosis not present

## 2022-08-30 DIAGNOSIS — M7918 Myalgia, other site: Secondary | ICD-10-CM | POA: Diagnosis not present

## 2022-08-30 DIAGNOSIS — M25551 Pain in right hip: Secondary | ICD-10-CM | POA: Diagnosis not present

## 2022-08-31 ENCOUNTER — Telehealth: Payer: Self-pay | Admitting: *Deleted

## 2022-08-31 NOTE — Telephone Encounter (Signed)
Ortho bundle 30 day call completed. °

## 2022-09-03 ENCOUNTER — Telehealth: Payer: Self-pay | Admitting: Internal Medicine

## 2022-09-03 DIAGNOSIS — M7918 Myalgia, other site: Secondary | ICD-10-CM | POA: Diagnosis not present

## 2022-09-03 DIAGNOSIS — M47817 Spondylosis without myelopathy or radiculopathy, lumbosacral region: Secondary | ICD-10-CM | POA: Diagnosis not present

## 2022-09-03 DIAGNOSIS — M25551 Pain in right hip: Secondary | ICD-10-CM | POA: Diagnosis not present

## 2022-09-03 DIAGNOSIS — M6281 Muscle weakness (generalized): Secondary | ICD-10-CM | POA: Diagnosis not present

## 2022-09-03 NOTE — Telephone Encounter (Signed)
Left message for patient to call me back, regarding the concern below.

## 2022-09-03 NOTE — Telephone Encounter (Signed)
New Message:     Patient said she just had therapy and had an episode. Her blood pressure went to 220//109 and oxygen was 85.   Pt c/o BP issue: STAT if pt c/o blurred vision, one-sided weakness or slurred speech  1. What are your last 5 BP readings? Blood pressure went up to 220/109 and  after 25 minutes, it went to 158/84  2. Are you having any other symptoms (ex. Dizziness, headache, blurred vision, passed out)? Little lightheaded when she stands- oxygen level went down to 85  3. What is your BP issue? Blood pressure going up high quickly-patient wants to be seen-

## 2022-09-04 NOTE — Telephone Encounter (Signed)
Pt at PT   ON treadmill 1.4 miles per hour   Sats went down to 86%   BP went to 220/105     I would recomm CPX stress test   She had one in 2020   It did nto do this

## 2022-09-04 NOTE — Telephone Encounter (Signed)
Pt called back 09/04/2022.  Pt states she has back problems and left knee pain.  She sees Pt to address these concerns.    Pt states she was on a treadmill, and became hypertensive: BP 220/105, and O2 dropped to 85%.  She got off the treadmill and rested, andher O2 returned to 98%; BP went from 160/100 to, and after 15 minutes went to 156/86 while sitting.    Pt states she is frustrated, because she is unable to take long walks without feeling out of breath.  I asked if she has any other symptoms with walking long distances, like palpitations, and she stated no.   Pt stated she doesn't have ANY hypertension or shortness of breath walking around the house, or walking shorter distances.    Pt was seeking some guidance, and would like her BP and her O2 / dyspnea while exercising during long intervals addressed.  I told Pt that I would make Dr. [name] aware of her concerns.

## 2022-09-10 ENCOUNTER — Telehealth: Payer: Self-pay | Admitting: Internal Medicine

## 2022-09-10 DIAGNOSIS — I10 Essential (primary) hypertension: Secondary | ICD-10-CM

## 2022-09-10 DIAGNOSIS — R0902 Hypoxemia: Secondary | ICD-10-CM

## 2022-09-10 NOTE — Telephone Encounter (Signed)
Patient stated she is due to be scheduled for a stress test per Dr. [name].

## 2022-09-10 NOTE — Telephone Encounter (Signed)
Called and made patient aware that Dr. [name] would like for her to have a CPX done (see phone note from 11/20). Reviewed instructions with the patient and sent them through [place]. Made patient aware that she would be contacted by our [facility] team to schedule. She verbalized understanding and thanked me for the call.

## 2022-09-12 DIAGNOSIS — R0609 Other forms of dyspnea: Secondary | ICD-10-CM

## 2022-09-12 DIAGNOSIS — Z01810 Encounter for preprocedural cardiovascular examination: Secondary | ICD-10-CM

## 2022-09-12 NOTE — Telephone Encounter (Signed)
Dr [name] spoke with the pt.

## 2022-09-12 NOTE — Telephone Encounter (Signed)
Called pt   Not feeling good    PT 5 days ago  BP very high after walking on treadmill   220/105    Sats dropped to 85%  Recovered to 98% within 3 min  Yesterday BP low   87/56   Then 107/    Held back on meds      Notes some discomfort on L side of chest    Lasts about 1 min    Can't walk   Feels like she could pass out   REcomm CT angiogram   R/O PE    Keep on same meds

## 2022-09-12 NOTE — Telephone Encounter (Signed)
Pt information and instructions sent to her My Chart for her CPX.

## 2022-09-13 ENCOUNTER — Ambulatory Visit: Payer: Medicare Other | Admitting: Psychology

## 2022-09-13 ENCOUNTER — Telehealth: Payer: Self-pay

## 2022-09-13 ENCOUNTER — Other Ambulatory Visit: Payer: Medicare Other

## 2022-09-13 DIAGNOSIS — Z01810 Encounter for preprocedural cardiovascular examination: Secondary | ICD-10-CM

## 2022-09-13 DIAGNOSIS — R0602 Shortness of breath: Secondary | ICD-10-CM

## 2022-09-13 DIAGNOSIS — R0789 Other chest pain: Secondary | ICD-10-CM

## 2022-09-13 NOTE — Telephone Encounter (Signed)
I spoke with the pt and in an attempt to get her Chest CT angio to r/o PE prior to 09/17/22... she will have to come in tomorrow morning early to have a stat BMET and then after the results comes back she can walk in to [place] to have her CT done.   Pt declined coming in today to have the BMET due to having out of town guests and having to cook a meal and not be able to [place] her house.

## 2022-09-14 ENCOUNTER — Ambulatory Visit
Admission: RE | Admit: 2022-09-14 | Discharge: 2022-09-14 | Disposition: A | Discharge status: Home / Self Care | Payer: Medicare Other | Source: Ambulatory Visit | Attending: Internal Medicine | Admitting: Internal Medicine

## 2022-09-14 ENCOUNTER — Ambulatory Visit: Payer: Medicare Other | Attending: Internal Medicine

## 2022-09-14 DIAGNOSIS — Z01810 Encounter for preprocedural cardiovascular examination: Secondary | ICD-10-CM | POA: Diagnosis not present

## 2022-09-14 DIAGNOSIS — J984 Other disorders of lung: Secondary | ICD-10-CM | POA: Diagnosis not present

## 2022-09-14 DIAGNOSIS — M47816 Spondylosis without myelopathy or radiculopathy, lumbar region: Secondary | ICD-10-CM | POA: Diagnosis not present

## 2022-09-14 DIAGNOSIS — M47814 Spondylosis without myelopathy or radiculopathy, thoracic region: Secondary | ICD-10-CM | POA: Diagnosis not present

## 2022-09-14 DIAGNOSIS — I2510 Atherosclerotic heart disease of native coronary artery without angina pectoris: Secondary | ICD-10-CM | POA: Diagnosis not present

## 2022-09-14 LAB — BASIC METABOLIC PANEL
BUN/Creatinine Ratio: 23 (ref 12–28)
BUN: 18 mg/dL (ref 8–27)
CO2: 27 mmol/L (ref 20–29)
Calcium: 9.9 mg/dL (ref 8.7–10.3)
Chloride: 97 mmol/L (ref 96–106)
Creatinine, Ser: 0.78 mg/dL (ref 0.57–1.00)
Glucose: 102 mg/dL — ABNORMAL HIGH (ref 70–99)
Potassium: 5.3 mmol/L — ABNORMAL HIGH (ref 3.5–5.2)
Sodium: 132 mmol/L — ABNORMAL LOW (ref 134–144)
eGFR: 79 mL/min/{1.73_m2} (ref >59)

## 2022-09-14 MED ORDER — IOPAMIDOL (ISOVUE-370) INJECTION 76%: 75.0000 mL | Freq: Once | INTRAVENOUS | Status: DC | PRN

## 2022-09-14 MED ORDER — IOPAMIDOL (ISOVUE-370) INJECTION 76%
75.0000 mL | Freq: Once | INTRAVENOUS | Status: AC | PRN
  Administered 2022-09-14: 75 mL via INTRAVENOUS

## 2022-09-14 NOTE — Telephone Encounter (Signed)
Pt aware lab work stable for CT. She will go to [facility] location today for CT

## 2022-09-14 NOTE — Telephone Encounter (Signed)
Patient is following up, hopeful to have lab results review as soon as possible. Patient states that she was advised that her lab results would determine whether or not she is eligible to have CT scan this afternoon.

## 2022-09-17 ENCOUNTER — Ambulatory Visit (HOSPITAL_COMMUNITY): Payer: Medicare Other

## 2022-09-18 DIAGNOSIS — R0609 Other forms of dyspnea: Secondary | ICD-10-CM

## 2022-09-20 ENCOUNTER — Ambulatory Visit: Payer: Medicare Other | Admitting: Psychology

## 2022-09-24 ENCOUNTER — Telehealth: Payer: Self-pay | Admitting: Orthopaedic Surgery

## 2022-09-24 NOTE — Telephone Encounter (Signed)
No antibiotics.  

## 2022-09-24 NOTE — Telephone Encounter (Signed)
Patient called advised she is having a dental cleaning Thursday. Patient asked if she will need an antibiotic prior to the cleaning?  Patient uses [facility] [employer] [facility] [employer]. The number to contact patient is [phone]

## 2022-09-25 NOTE — Telephone Encounter (Signed)
Called and advised pt.

## 2022-09-26 ENCOUNTER — Telehealth: Payer: Self-pay | Admitting: Internal Medicine

## 2022-09-26 ENCOUNTER — Emergency Department (HOSPITAL_COMMUNITY)
Admission: EM | Admit: 2022-09-26 | Discharge: 2022-09-26 | Disposition: A | Discharge status: Home / Self Care | Payer: Medicare Other | Attending: Emergency Medicine | Admitting: Emergency Medicine

## 2022-09-26 ENCOUNTER — Encounter (HOSPITAL_COMMUNITY): Payer: Self-pay | Admitting: Emergency Medicine

## 2022-09-26 ENCOUNTER — Emergency Department (HOSPITAL_COMMUNITY): Payer: Medicare Other

## 2022-09-26 DIAGNOSIS — Z1152 Encounter for screening for COVID-19: Secondary | ICD-10-CM | POA: Diagnosis not present

## 2022-09-26 DIAGNOSIS — R0602 Shortness of breath: Secondary | ICD-10-CM | POA: Insufficient documentation

## 2022-09-26 DIAGNOSIS — Z79899 Other long term (current) drug therapy: Secondary | ICD-10-CM | POA: Diagnosis not present

## 2022-09-26 DIAGNOSIS — I10 Essential (primary) hypertension: Secondary | ICD-10-CM | POA: Diagnosis not present

## 2022-09-26 DIAGNOSIS — R519 Headache, unspecified: Secondary | ICD-10-CM | POA: Diagnosis not present

## 2022-09-26 DIAGNOSIS — I6782 Cerebral ischemia: Secondary | ICD-10-CM | POA: Diagnosis not present

## 2022-09-26 DIAGNOSIS — R0600 Dyspnea, unspecified: Secondary | ICD-10-CM | POA: Diagnosis not present

## 2022-09-26 LAB — URINALYSIS, ROUTINE W REFLEX MICROSCOPIC
Bilirubin Urine: NEGATIVE
Glucose, UA: NEGATIVE mg/dL
Hgb urine dipstick: NEGATIVE
Ketones, ur: NEGATIVE mg/dL
Nitrite: NEGATIVE
Protein, ur: NEGATIVE mg/dL
Specific Gravity, Urine: 1.005 (ref 1.005–1.030)
pH: 7 (ref 5.0–8.0)

## 2022-09-26 LAB — CBC WITH DIFFERENTIAL/PLATELET
Abs Immature Granulocytes: 0.02 10*3/uL (ref 0.00–0.07)
Basophils Absolute: 0.1 10*3/uL (ref 0.0–0.1)
Basophils Relative: 1 %
Eosinophils Absolute: 0.1 10*3/uL (ref 0.0–0.5)
Eosinophils Relative: 2 %
HCT: 38.6 % (ref 36.0–46.0)
Hemoglobin: 12.6 g/dL (ref 12.0–15.0)
Immature Granulocytes: 0 %
Lymphocytes Relative: 30 %
Lymphs Abs: 1.4 10*3/uL (ref 0.7–4.0)
MCH: 31.5 pg (ref 26.0–34.0)
MCHC: 32.6 g/dL (ref 30.0–36.0)
MCV: 96.5 fL (ref 80.0–100.0)
Monocytes Absolute: 0.3 10*3/uL (ref 0.1–1.0)
Monocytes Relative: 7 %
Neutro Abs: 2.7 10*3/uL (ref 1.7–7.7)
Neutrophils Relative %: 60 %
Platelets: 180 10*3/uL (ref 150–400)
RBC: 4 MIL/uL (ref 3.87–5.11)
RDW: 12.9 % (ref 11.5–15.5)
WBC: 4.6 10*3/uL (ref 4.0–10.5)
nRBC: 0 % (ref 0.0–0.2)

## 2022-09-26 LAB — TROPONIN I (HIGH SENSITIVITY)
Troponin I (High Sensitivity): 4 ng/L (ref <18)
Troponin I (High Sensitivity): 6 ng/L (ref <18)

## 2022-09-26 LAB — COMPREHENSIVE METABOLIC PANEL
ALT: 20 U/L (ref 0–44)
AST: 23 U/L (ref 15–41)
Albumin: 4.2 g/dL (ref 3.5–5.0)
Alkaline Phosphatase: 80 U/L (ref 38–126)
Anion gap: 10 (ref 5–15)
BUN: 7 mg/dL — ABNORMAL LOW (ref 8–23)
CO2: 23 mmol/L (ref 22–32)
Calcium: 9.5 mg/dL (ref 8.9–10.3)
Chloride: 97 mmol/L — ABNORMAL LOW (ref 98–111)
Creatinine, Ser: 0.83 mg/dL (ref 0.44–1.00)
GFR, Estimated: >60 mL/min (ref >60)
Glucose, Bld: 110 mg/dL — ABNORMAL HIGH (ref 70–99)
Potassium: 4.7 mmol/L (ref 3.5–5.1)
Sodium: 130 mmol/L — ABNORMAL LOW (ref 135–145)
Total Bilirubin: 0.9 mg/dL (ref 0.3–1.2)
Total Protein: 7.2 g/dL (ref 6.5–8.1)

## 2022-09-26 LAB — RESP PANEL BY RT-PCR (RSV, FLU A&B, COVID)  RVPGX2
Influenza A by PCR: NEGATIVE
Influenza B by PCR: NEGATIVE
Resp Syncytial Virus by PCR: NEGATIVE
SARS Coronavirus 2 by RT PCR: NEGATIVE

## 2022-09-26 LAB — ETHANOL: Alcohol, Ethyl (B): <10 mg/dL (ref <10)

## 2022-09-26 MED ORDER — ACETAMINOPHEN 500 MG PO TABS
1000.0000 mg | ORAL_TABLET | Freq: Once | ORAL | Status: AC
  Administered 2022-09-26: 1000 mg via ORAL
  Filled 2022-09-26: qty 2

## 2022-09-26 NOTE — ED Provider Triage Note (Signed)
Emergency Medicine Provider Triage Evaluation Note  Amanda Wilkins , a 76 y.o. female  was evaluated in triage.  Pt complains of headache, dyspnea, hypertension.  She went to bed in her usual state of health awoke this morning with after mentioned complaints, right-sided headache, but no vision loss, no weakness, no chest pain.  Review of Systems  Positive: HPI Negative: No chest pain, no abdominal pain, no vomiting, no true weakness  Physical Exam  BP (!) 178/89 (BP Location: Right Arm)   Pulse 72   Temp (!) 97.5 F (36.4 C)   Resp 20   Ht '5\' 5"'$  (1.651 m)   Wt 104 kg   SpO2 100%   BMI 38.15 kg/m  Gen:   Awake, no distress speaking clearly Resp:  Normal effort  MSK:   Moves extremities without difficulty no deformity Other:  Neuro: Face symmetric speech clear  Medical Decision Making  Medically screening exam initiated at 9:01 AM.  Appropriate orders placed.  Amanda Wilkins was informed that the remainder of the evaluation will be completed by another provider, this initial triage assessment does not replace that evaluation, and the importance of remaining in the ED until their evaluation is complete.   Carmin Muskrat, MD 09/26/22 [phone]

## 2022-09-26 NOTE — ED Provider Notes (Signed)
[facility] EMERGENCY DEPARTMENT Provider Note   CSN: [id number] Arrival date & time: 09/26/22  0850     History  Chief Complaint  Patient presents with   Hypertension   Headache    Amanda Wilkins is a 76 y.o. female with a past medical history of AKI, A-fib on Xarelto, bronchitis, GERD, migraines, hypertension presenting to the emergency room for evaluation of headache and hypertension.  Patient states this morning she woke up having a severe headache and her blood pressure went up to the 528U systolically.  Patient states her blood pressure has been fluctuating in the last 3 weeks, highest in the 200s.  She also reports seeing flashes in her right eye about a week ago followed by migraine headache.  Reports sometimes she does have chest tightness with exertion and shortness of breath that comes and go.  No fever, nausea, vomiting, dizziness, bowel changes, urinary symptoms.   Hypertension Associated symptoms include headaches.  Headache     Past Medical History:  Diagnosis Date   Acute bronchitis 09/11/2016   11/17 refractory   Acute cystitis without hematuria 05/17/2015   Acute kidney injury [facility])    Labs today   Acute right ankle pain 09/09/2018   Anticoagulant long-term use    Xarelto   Axillary pain    Bronchitis    Cellulitis    Cholecystitis    Chronic interstitial nephritis    CONJUNCTIVITIS, ACUTE 10/18/2010   Qualifier: Diagnosis of  By: Plotnikov MD, Evie Lacks    D-dimer, elevated    Depression    Eye inflammation    GERD (gastroesophageal reflux disease)    History of interstitial nephritis 2016   chronic   History of kidney stones    has a small one found on xray   History of nuclear stress test 12/16/2014   Intermediate risk nuclear study w/ medium size moderate severity reversible defect in the basal and mid inferolateral and inferior wall (per dr cardiology note , dr [name] did not think this was consistent with  ischemia)/  normal LV function and wall motion, ef 75%   History of septic shock 01/22/2015   in setting Group A Strep Cellulits erysipelas/chest wall induration with streptoccocus basterium-- Severe sepsis, DIC, Acute respiratory failure with pulmonary edema, Acute Kidney failure with chronic interstitial nephritis   Hypertension    Hyponatremia    Migraines    on Zoloft for migraines   Mild carotid artery disease ([place])    per duplex 08-30-2017 bilateral ICA 1-39%   OA (osteoarthritis) rheumotologist-  dr [name]   both knees,  shoulders, ankles   OSA on CPAP     moderate obstructive sleep apnea with an AHI of 23.4/h and oxygen desaturations as low as 84%.  Now on CPAP at 12 cm H2O.   PAF (paroxysmal atrial fibrillation) [facility]) 2009   cardiologist-- dr [name]   Paroxysmal atrial flutter [facility])    a. dx 11/2017.   PSVT (paroxysmal supraventricular tachycardia)    RA (rheumatoid arthritis) ([place])    Wears contact lenses    Past Surgical History:  Procedure Laterality Date   BREAST BIOPSY Right 05/15/2017   [place]   CARDIOVERSION N/A 08/28/2021   Procedure: CARDIOVERSION;  Surgeon: Fay Records, MD;  Location: [facility] ENDOSCOPY;  Service: Cardiovascular;  Laterality: N/A;   [place] N/A 11/16/2021   Procedure: LAPAROSCOPIC CHOLECYSTECTOMY;  Surgeon: Ralene Ok, MD;  Location: [facility];  Service: General;  Laterality: N/A;   ESOPHAGOGASTRODUODENOSCOPY N/A 02/08/2015   Procedure: ESOPHAGOGASTRODUODENOSCOPY (EGD);  Surgeon: [name], MD;  Location: [facility];  Service: Endoscopy;  Laterality: N/A;   KNEE ARTHROSCOPY W/ MENISCAL REPAIR Left 08/2014    '@[facility]'$    SHOULDER SURGERY Right 04/04/2016   TOTAL HIP ARTHROPLASTY Right 06/19/2022   Procedure: RIGHT TOTAL HIP ARTHROPLASTY ANTERIOR APPROACH;  Surgeon: Mcarthur Rossetti, MD;  Location: [facility];  Service: Orthopedics;  Laterality: Right;   TOTAL KNEE ARTHROPLASTY Right 09/01/2018    Procedure: RIGHT TOTAL KNEE ARTHROPLASTY;  Surgeon: [name], MD;  Location: [facility];  Service: Orthopedics;  Laterality: Right;     Home Medications Prior to Admission medications   Medication Sig Start Date End Date Taking? Authorizing Provider  acetaminophen (TYLENOL) 500 MG tablet Take 1,000 mg by mouth daily as needed for moderate pain or mild pain (pain).    [provider]  ALPRAZolam Duanne Moron) 0.25 MG tablet Take 1 tablet (0.25 mg total) by mouth 2 (two) times daily as needed for anxiety. 07/18/22   Plotnikov, Evie Lacks, MD  amLODipine (NORVASC) 2.5 MG tablet Take 0.5 tablets (1.25 mg total) by mouth daily. 11/15/21   Fay Records, MD  apixaban (ELIQUIS) 5 MG TABS tablet TAKE 1 TABLET(5 MG) BY MOUTH TWICE DAILY 05/16/22   Fay Records, MD  cholecalciferol (VITAMIN D3) 25 MCG (1000 UT) tablet Take 1,000 Units by mouth daily.    [provider]  EPINEPHrine 0.3 mg/0.3 mL IJ SOAJ injection Inject 0.3 mg into the muscle as needed for anaphylaxis. As needed for life-threatening allergic reactions 04/02/22   Valentina Shaggy, MD  famotidine (PEPCID) 40 MG tablet Take 1 tablet (40 mg total) by mouth daily. 07/18/22   Plotnikov, Evie Lacks, MD  flecainide (TAMBOCOR) 50 MG tablet Take 1.5 tablets (75 mg total) by mouth 2 (two) times daily. 08/24/22   Fay Records, MD  HYDROcodone-acetaminophen [facility]) 10-325 MG tablet Take 1 tablet by mouth every 6 (six) hours as needed for severe pain. 07/18/22   Plotnikov, Evie Lacks, MD  hydroxychloroquine (PLAQUENIL) 200 MG tablet Take 400 mg by mouth every morning.    [provider]  metoprolol tartrate (LOPRESSOR) 25 MG tablet Take 1 tablet (25 mg total) by mouth 2 (two) times daily. CAN TAKE AN ADDITIONAL TAB AS NEEDED FOR PALPITATIONS 08/23/22   Marylu Lund., NP  pantoprazole (PROTONIX) 40 MG tablet Take 1 tablet (40 mg total) by mouth daily. Patient taking differently: Take 40 mg by mouth daily as needed (Acid reflux).  03/06/22   Fay Records, MD  Probiotic Product (ALIGN) 4 MG CAPS Take 4 mg by mouth daily.    [provider]  rosuvastatin (CRESTOR) 5 MG tablet Take 1 tablet (5 mg total) by mouth daily. 09/07/21   Elodia Florence., MD  sertraline (ZOLOFT) 50 MG tablet TAKE 1 TABLET BY MOUTH DAILY 07/12/22   Plotnikov, Evie Lacks, MD      Allergies    Epinephrine base, Aspirin, Benadryl [diphenhydramine], Covid-19 ad26 vaccine(janssen), Fish allergy, Hylan g-f 20, Penicillins, Tape, and Wound dressing adhesive    Review of Systems   Review of Systems  Neurological:  Positive for headaches.    Physical Exam Updated Vital Signs BP 137/64   Pulse 64   Temp 97.9 F (36.6 C)   Resp (!) 22   Ht '5\' 5"'$  (1.651 m)   Wt 104 kg   SpO2 99%   BMI 38.15 kg/m  Physical Exam  ED Results / Procedures / Treatments   Labs (all labs ordered are listed, but only abnormal results are displayed) Labs Reviewed  COMPREHENSIVE METABOLIC PANEL - Abnormal; Notable for the following components:      Result Value   Sodium 130 (*)    Chloride 97 (*)    Glucose, Bld 110 (*)    BUN 7 (*)    All other components within normal limits  URINALYSIS, ROUTINE W REFLEX MICROSCOPIC - Abnormal; Notable for the following components:   Color, Urine STRAW (*)    Leukocytes,Ua TRACE (*)    Bacteria, UA RARE (*)    All other components within normal limits  RESP PANEL BY RT-PCR (RSV, FLU A&B, COVID)  RVPGX2  ETHANOL  CBC WITH DIFFERENTIAL/PLATELET  TROPONIN I (HIGH SENSITIVITY)  TROPONIN I (HIGH SENSITIVITY)    EKG EKG Interpretation  Date/Time:  Wednesday September 26 2022 09:01:28 EST Ventricular Rate:  71 PR Interval:  250 QRS Duration: 98 QT Interval:  392 QTC Calculation: 425 R Axis:   66 Text Interpretation: Sinus rhythm with 1st degree A-V block with Premature supraventricular complexes Low voltage QRS Borderline ECG Confirmed by Carmin Muskrat [phone]) on 09/26/2022 9:03:35 AM  Radiology CT Head  Wo Contrast  Result Date: 09/26/2022 CLINICAL DATA:  New onset headache. EXAM: CT HEAD WITHOUT CONTRAST TECHNIQUE: Contiguous axial images were obtained from the base of the skull through the vertex without intravenous contrast. RADIATION DOSE REDUCTION: This exam was performed according to the departmental dose-optimization program which includes automated exposure control, adjustment of the mA and/or kV according to patient size and/or use of iterative reconstruction technique. COMPARISON:  CT head dated May 31, 2018. FINDINGS: Brain: No evidence of acute infarction, hemorrhage, hydrocephalus, extra-axial collection or mass lesion/mass effect. Stable atrophy. Mildly progressive chronic microvascular ischemic changes. Vascular: Calcified atherosclerosis at the skull base. No hyperdense vessel. Skull: Normal. Negative for fracture or focal lesion. Sinuses/Orbits: No acute finding. Other: None. IMPRESSION: 1. No acute intracranial abnormality. 2. Mildly progressive chronic microvascular ischemic changes. Electronically Signed   By: Titus Dubin M.D.   On: 09/26/2022 10:31   DG Chest 2 View  Result Date: 09/26/2022 CLINICAL DATA:  headache, dyspnea, hypertension. EXAM: CHEST - 2 VIEW COMPARISON:  08/20/2022 FINDINGS: New diffuse moderate coarse interstitial edema or infiltrates involving bases more than apices, with septal lines noted peripherally. Heart size and mediastinal contours are within normal limits. Aortic Atherosclerosis (ICD10-170.0). No effusion. Visualized bones unremarkable. IMPRESSION: New diffuse interstitial edema or infiltrates. Electronically Signed   By: Lucrezia Europe M.D.   On: 09/26/2022 09:31    Procedures Procedures    Medications Ordered in ED Medications  acetaminophen (TYLENOL) tablet 1,000 mg (1,000 mg Oral Given 09/26/22 1841)    ED Course/ Medical Decision Making/ A&P                           Medical Decision Making Amount and/or Complexity of Data  Reviewed Labs: ordered.  Risk OTC drugs.   This patient presents to the ED for headache, hypertension, this involves an extensive number of treatment options, and is a complaint that carries with a high risk of complications and morbidity.  The differential diagnosis includes primary hypertension, hypertensive urgency, hypertensive emergency, CVA, medication noncompliance, migraine headaches, tension type headache.  This is not an exhaustive list.  Comorbidities that complicate the patient evaluation See HPI  Social determinants of health NA  Additional history obtained:  External records from outside source obtained and reviewed including: Chart review including previous notes, labs, imaging.  Cardiac monitoring/EKG: The patient was maintained on a cardiac monitor.  I personally reviewed and interpreted the cardiac monitor which showed an underlying rhythm of: Sinus rhythm.  Lab tests: I ordered and personally interpreted labs.  The pertinent results include: WBC unremarkable. Hbg unremarkable. Platelets unremarkable. No electrolyte abnormalities noted. BUN, creatinine unremarkable. UA significant for no acute abnormality.   Imaging studies: I ordered imaging studies. I personally reviewed, interpreted imaging and agree with the radiologist's interpretations. Findings include: CT head showed no acute intracranial abnormality. Chest x-ray show new interstitial edema or infiltrates.  Problem list/ ED course/ Critical interventions/ Medical management: HPI: See above Vital signs within normal range and stable throughout visit. Laboratory/imaging studies significant for: See above. On physical examination, patient is afebrile and appears in no acute distress.  Heart sounds normal.  Lungs are clear, no wheezes or rales. CT head showed no acute intracranial abnormality. Chest x-ray show new interstitial edema or infiltrates.  Patient is hemodynamically stable at the moment.  Blood  pressure has gone down from 156 to 153P systolically..  Based on patient's clinical presentations and laboratory/imaging studies I suspect hypertensive urgency.  I ordered Tylenol for headache.  Reevaluation after these medications showed the patient improved.  Advised patient to follow-up with primary care physician or cardiology for further evaluation and management.  Return to the ER if new or worsening symptoms.. I have reviewed the patient home medicines and have made adjustments as needed.  Consultations obtained: I requested consultation with Dr. Doren Custard, and discussed lab and imaging findings as well as pertinent plan.  He/she agrees with the plan.  Disposition Continued outpatient therapy. Follow-up with PCP or cardiology recommended for reevaluation of symptoms. Treatment plan discussed with patient.  Pt acknowledged understanding was agreeable to the plan. Worrisome signs and symptoms were discussed with patient, and patient acknowledged understanding to return to the ED if they noticed these signs and symptoms. Patient was stable upon discharge.   This chart was dictated using voice recognition software.  Despite best efforts to proofread,  errors can occur which can change the documentation meaning.          Final Clinical Impression(s) / ED Diagnoses Final diagnoses:  Acute nonintractable headache, unspecified headache type    Rx / DC Orders ED Discharge Orders     None         Rex Kras, [place] 09/27/22 1035    Godfrey Pick, MD 10/02/22 [phone]

## 2022-09-26 NOTE — Telephone Encounter (Signed)
I spoke with the pt and she reports that she woke up with a severe headache this morning and her BP was up so she went to the ED... she had a head CT and a CXR.. her BP was 179/89 which she says was an improvement since she took her meds before she went to the ED.   She is asking if DR [name] can review her tests done today and let her now when she should come back to see her... she wants to be seen sooner than later.    I will forward to Dr [name] for her review.   She is still currently in the ED.

## 2022-09-26 NOTE — ED Triage Notes (Signed)
Pt reports waking up with a headache, then noticed her BP was elevated. Pt then took extra HTN meds. Also noticed some SOB and chest tightness.

## 2022-09-26 NOTE — Telephone Encounter (Signed)
  Pt is requesting to speak with RN Lelon Frohlich, she said there's something important she needs to discuss with her. She did not provide anymore details

## 2022-09-26 NOTE — Telephone Encounter (Signed)
Left a message for the pt to call back.  

## 2022-09-26 NOTE — Discharge Instructions (Addendum)
Your labs, EKG and imaging studies were reassuring today.  Please take tylenol/ibuprofen for pain. I recommend close follow-up with PCP or cardiology for reevaluation.  Please do not hesitate to return to emergency department if worrisome signs symptoms we discussed become apparent.

## 2022-09-27 ENCOUNTER — Ambulatory Visit: Payer: Medicare Other | Admitting: Psychology

## 2022-09-27 ENCOUNTER — Encounter: Payer: Self-pay | Admitting: Nurse Practitioner

## 2022-09-27 ENCOUNTER — Ambulatory Visit: Payer: Medicare Other | Attending: Nurse Practitioner | Admitting: Nurse Practitioner

## 2022-09-27 VITALS — BP 130/86 | HR 65 | Wt 230.6 lb

## 2022-09-27 DIAGNOSIS — R0609 Other forms of dyspnea: Secondary | ICD-10-CM | POA: Diagnosis not present

## 2022-09-27 DIAGNOSIS — I480 Paroxysmal atrial fibrillation: Secondary | ICD-10-CM | POA: Insufficient documentation

## 2022-09-27 DIAGNOSIS — R0789 Other chest pain: Secondary | ICD-10-CM | POA: Insufficient documentation

## 2022-09-27 DIAGNOSIS — R0602 Shortness of breath: Secondary | ICD-10-CM | POA: Insufficient documentation

## 2022-09-27 DIAGNOSIS — I10 Essential (primary) hypertension: Secondary | ICD-10-CM | POA: Diagnosis not present

## 2022-09-27 MED ORDER — AMLODIPINE BESYLATE 2.5 MG PO TABS: 2.5000 mg | ORAL_TABLET | Freq: Two times a day (BID) | ORAL | 6 refills | Status: DC

## 2022-09-27 MED ORDER — METOPROLOL TARTRATE 25 MG PO TABS: 25.0000 mg | ORAL_TABLET | Freq: Three times a day (TID) | ORAL | 3 refills | Status: DC

## 2022-09-27 NOTE — Telephone Encounter (Signed)
I would recomm she increase amlodipine to 5 mg 2x per day

## 2022-09-27 NOTE — Telephone Encounter (Signed)
Spoke with pt who reports she was seen in the ED yesterday for elevated 178/89 and headache.  She states no medication adjustments were made in the ED and she was advised to follow up with cardiology for HTN.   This morning BP 155/91.  She denies current headache as she has taken a Hydrocodone with relief.  Appointment scheduled with Danford Bad, NP for today 09/27/2022 at 245pm.  Pt verbalizes understanding and agrees with current plan.

## 2022-09-27 NOTE — Progress Notes (Signed)
Office Visit    Patient Name: Amanda Wilkins Date of Encounter: 09/27/2022  Primary Care Provider:  [name], MD Primary Cardiologist:  [name], MD Primary Electrophysiologist: None  Chief Complaint    Amanda Wilkins is a 76 y.o. female with PMH of paroxysmal AF/flutter (on Eliquis), HTN, obesity, chronic interstitial nephritis, OSA (on CPAP), PSVT, mild carotid disease (1-39%), HLD, aortic atherosclerosis, mild MR, right total hip arthroplasty who presents today for elevated BP  Past Medical History    Past Medical History:  Diagnosis Date   Acute bronchitis 09/11/2016   11/17 refractory   Acute cystitis without hematuria 05/17/2015   Acute kidney injury [facility])    Labs today   Acute right ankle pain 09/09/2018   Anticoagulant long-term use    Xarelto   Axillary pain    Bronchitis    Cellulitis    Cholecystitis    Chronic interstitial nephritis    CONJUNCTIVITIS, ACUTE 10/18/2010   Qualifier: Diagnosis of  By: Plotnikov MD, Evie Lacks    D-dimer, elevated    Depression    Eye inflammation    GERD (gastroesophageal reflux disease)    History of interstitial nephritis 2016   chronic   History of kidney stones    has a small one found on xray   History of nuclear stress test 12/16/2014   Intermediate risk nuclear study w/ medium size moderate severity reversible defect in the basal and mid inferolateral and inferior wall (per dr cardiology note , dr [name] did not think this was consistent with ischemia)/  normal LV function and wall motion, ef 75%   History of septic shock 01/22/2015   in setting Group A Strep Cellulits erysipelas/chest wall induration with streptoccocus basterium-- Severe sepsis, DIC, Acute respiratory failure with pulmonary edema, Acute Kidney failure with chronic interstitial nephritis   Hypertension    Hyponatremia    Migraines    on Zoloft for migraines   Mild carotid artery disease ([place])    per duplex 08-30-2017  bilateral ICA 1-39%   OA (osteoarthritis) rheumotologist-  dr [name]   both knees,  shoulders, ankles   OSA on CPAP     moderate obstructive sleep apnea with an AHI of 23.4/h and oxygen desaturations as low as 84%.  Now on CPAP at 12 cm H2O.   PAF (paroxysmal atrial fibrillation) [facility]) 2009   cardiologist-- dr [name]   Paroxysmal atrial flutter [facility])    a. dx 11/2017.   PSVT (paroxysmal supraventricular tachycardia)    RA (rheumatoid arthritis) ([place])    Wears contact lenses    Past Surgical History:  Procedure Laterality Date   BREAST BIOPSY Right 05/15/2017   [place]   CARDIOVERSION N/A 08/28/2021   Procedure: CARDIOVERSION;  Surgeon: Fay Records, MD;  Location: [facility] ENDOSCOPY;  Service: Cardiovascular;  Laterality: N/A;   [place] N/A 11/16/2021   Procedure: LAPAROSCOPIC CHOLECYSTECTOMY;  Surgeon: Ralene Ok, MD;  Location: [place];  Service: General;  Laterality: N/A;   ESOPHAGOGASTRODUODENOSCOPY N/A 02/08/2015   Procedure: ESOPHAGOGASTRODUODENOSCOPY (EGD);  Surgeon: [name], MD;  Location: [place];  Service: Endoscopy;  Laterality: N/A;   KNEE ARTHROSCOPY W/ MENISCAL REPAIR Left 08/2014    '@[facility]'$    SHOULDER SURGERY Right 04/04/2016   TOTAL HIP ARTHROPLASTY Right 06/19/2022   Procedure: RIGHT TOTAL HIP ARTHROPLASTY ANTERIOR APPROACH;  Surgeon: Mcarthur Rossetti, MD;  Location: [place];  Service: Orthopedics;  Laterality: Right;   TOTAL  KNEE ARTHROPLASTY Right 09/01/2018   Procedure: RIGHT TOTAL KNEE ARTHROPLASTY;  Surgeon: [name], MD;  Location: [facility]S;  Service: Orthopedics;  Laterality: Right;    Allergies  Allergies  Allergen Reactions   Epinephrine Base Other (See Comments)    Seriously increases heart rate   Aspirin Other (See Comments)    Can take the coated 325 mg. Plain asa 325 mg speeds up the heart.   Benadryl [Diphenhydramine]     Increased heart rate   Covid-19 Ad26 Vaccine(Janssen) Hives     hives  6 hrs after her COVID 19 2nd booster - resolved    Fish Allergy Hives    Was imitation crab meat and broke out in hives, had skin testing for lobster and showed allergic    Hylan G-F 20 Other (See Comments)    Very painful (knee injection)   Penicillins Other (See Comments)    Pt previously has been told not to take because of family history of reactions (water blisters). She took amoxicillin in 2012-2013 with no reaction Pt received ancef on 11-16-2021 without issue   Tape Other (See Comments)    Blisters from adhesive tape   Wound Dressing Adhesive Other (See Comments)    Blisters from adhesive tape    History of Present Illness    Amanda Wilkins  is a 76 year old female with the above mention past medical history who presents today for complaint of shortness of breath with ambulation.  She was initially seen by Dr. [name] in 2014 after being followed by Dr. Verl Blalock for management of atrial fibrillation.  She had 2D echo completed 01/2015 that was normal with a EF of 50-60% and no RWMA or valvular abnormalities.  Myoview was also completed 12/2014 with moderate defect at base mid inferior lateral and inferior wall consistent with ischemia.  She wore a Holter monitor in January 2018 that showed A-fib and SVT.  She was recommended to start flecainide 50 mg twice daily however patient declined due to possible side effects.  She was noted to have mild plaque by carotid Doppler bilaterally 08/2017.  She was advised to titrate atenolol to 25 mg twice daily.  In February 2019 she presented with new onset atrial flutter and spontaneously converted to sinus rhythm.  She has sleep study obtain 01/2018 that indicated obstructive sleep apnea and was referred to Dr. Radford Pax for management.  She was started on CPAP with titration noted.  She underwent cardiac CT 06/2019 that showed calcium score 0.  She was seen in the office by Robbie Lis, PA on 11/2019 with complaint of dyspnea on exertion while riding her bike.   BNP was completed that was elevated at 433 and patient was recommended to use Lasix 3 times per week.  Repeat 2D echo was also completed that showed normal EF and mild MR with RVSP estimated at 47 mmHg.  She continues to complain of shortness of breath with exertion and chest x-ray was completed that showed no abnormalities.  She was seen by Dr. [name] 04/2021 for tachypalpitations that converted on their own and had complaint of dizziness with atenolol and was decreased to 25 mg twice daily.  She was admitted on 08/20/2021 for evaluation of atrial fibrillation.  She had increase atenolol to 50 mg twice daily with plan to start Multaq however was not affordable at that time.  She declined pharmacological Myoview at that time.  She was started on flecainide with plan to undergo outpatient DCCV.  She underwent procedure  08/2021 with conversion to sinus rhythm however reverted back to A-fib before discharge.  Flecainide was increased to 75 mg twice daily.  She was last seen by Dr. [name] on 07/2022 for follow-up and was doing well 1 episode of A-fib lasting 4 hours.  She was currently in PT for hip replacement.  She was continuing to maintain sinus rhythm blood pressure was slightly elevated and plan to continue to follow at that time.  She was euvolemic from a volume standpoint.  She was seen by me on 08/23/2022 for complaint of dyspnea on exertion.  During her visit she was in sinus rhythm and denies any palpitations.  She was instructed to take extra 25 mg of metoprolol for breakthrough palpitations.  No other med changes were made at that time.  She was seen by Dr. [name] 1 week later and EKG showed some ectopy but not clear AF.  Metoprolol was increased to 25 mg every 6 hours and amlodipine was discontinued.  She was euvolemic on examination.  She contacted our office and noted her blood pressure increasing to 220/109 with oxygen sat of 85.  Dr. [name] was notified of complaint and CPX stress test was ordered.  She contacted  office on 11/29 with complaint of decreased sats and shortness of breath.  Chest CT was ordered to rule out PE.  CT scan was completed with no evidence of pulmonary embolism or thoracic aortic dissection.  She was seen in the ED on 12/13 for complaint of severe headache and elevated blood pressures in the 299M systolically.  She reported elevated blood pressures this morning and was advised to follow-up with cardiology.   Ms. Donley presents today for blood pressure fluctuations and shortness of breath.  Since last being seen in the office patient reports that since her previous visit she is experienced bouts and fluctuations of blood pressure that have occurred with and without any activity.  She is notices over the past 4 weeks and this morning she reports that her blood pressure was 155/92 at home.  During today's visit her blood pressure was well-controlled at 130/86.  She reports no palpitations or tachycardia with her abnormal blood pressures and has not experienced any dizziness.  She is compliant with her current medication regimen and denies any adverse reactions. Patient denies chest pain, palpitations, dyspnea, PND, orthopnea, nausea, vomiting, dizziness, syncope, edema, weight gain, or early satiety.   Home Medications    Current Outpatient Medications  Medication Sig Dispense Refill   acetaminophen (TYLENOL) 500 MG tablet Take 1,000 mg by mouth daily as needed for moderate pain or mild pain (pain).     ALPRAZolam (XANAX) 0.25 MG tablet Take 1 tablet (0.25 mg total) by mouth 2 (two) times daily as needed for anxiety. 60 tablet 3   apixaban (ELIQUIS) 5 MG TABS tablet TAKE 1 TABLET(5 MG) BY MOUTH TWICE DAILY 60 tablet 5   cholecalciferol (VITAMIN D3) 25 MCG (1000 UT) tablet Take 1,000 Units by mouth daily.     EPINEPHrine 0.3 mg/0.3 mL IJ SOAJ injection Inject 0.3 mg into the muscle as needed for anaphylaxis. As needed for life-threatening allergic reactions 2 each 1   famotidine  (PEPCID) 40 MG tablet Take 1 tablet (40 mg total) by mouth daily. 90 tablet 3   flecainide (TAMBOCOR) 50 MG tablet Take 1.5 tablets (75 mg total) by mouth 2 (two) times daily. 270 tablet 3   HYDROcodone-acetaminophen (NORCO) 10-325 MG tablet Take 1 tablet by mouth every 6 (six) hours as  needed for severe pain. 100 tablet 0   hydroxychloroquine (PLAQUENIL) 200 MG tablet Take 400 mg by mouth every morning.     pantoprazole (PROTONIX) 40 MG tablet Take 1 tablet (40 mg total) by mouth daily. (Patient taking differently: Take 40 mg by mouth daily as needed (Acid reflux).) 30 tablet 0   Probiotic Product (ALIGN) 4 MG CAPS Take 4 mg by mouth daily.     rosuvastatin (CRESTOR) 5 MG tablet Take 1 tablet (5 mg total) by mouth daily. 90 tablet 3   sertraline (ZOLOFT) 50 MG tablet TAKE 1 TABLET BY MOUTH DAILY 90 tablet 1   tacrolimus (PROTOPIC) 0.1 % ointment SMARTSIG:sparingly Topical Daily     amLODipine (NORVASC) 2.5 MG tablet Take 1 tablet (2.5 mg total) by mouth in the morning and at bedtime. 30 tablet 6   metoprolol tartrate (LOPRESSOR) 25 MG tablet Take 1 tablet (25 mg total) by mouth 3 (three) times daily. CAN TAKE AN ADDITIONAL TAB AS NEEDED FOR PALPITATIONS 90 tablet 3   No current facility-administered medications for this visit.     Review of Systems  Please see the history of present illness.    (+) Shortness of breath with exertion (+) Fatigue  All other systems reviewed and are otherwise negative except as noted above.  Physical Exam    Wt Readings from Last 3 Encounters:  09/27/22 230 lb 9.6 oz (104.6 kg)  09/26/22 229 lb 4.5 oz (104 kg)  08/30/22 230 lb 9.6 oz (104.6 kg)   VS: Vitals:   09/27/22 1441  BP: 130/86  Pulse: 65  SpO2: 99%  ,Body mass index is 38.37 kg/m.  Constitutional:      Appearance: Healthy appearance. Not in distress.  Neck:     Vascular: JVD normal.  Pulmonary:     Effort: Pulmonary effort is normal.     Breath sounds: No wheezing. No rales.  Diminished in the bases Cardiovascular:     Normal rate. Regular rhythm. Normal S1. Normal S2.      Murmurs: There is no murmur.  Edema:    Peripheral edema absent.  Abdominal:     Palpations: Abdomen is soft non tender. There is no hepatomegaly.  Skin:    General: Skin is warm and dry.  Neurological:     General: No focal deficit present.     Mental Status: Alert and oriented to person, place and time.     Cranial Nerves: Cranial nerves are intact.  EKG/LABS/Other Studies Reviewed    ECG personally reviewed by me today -none completed today  Risk Assessment/Calculations:    CHA2DS2-VASc Score = 6   This indicates a 9.7% annual risk of stroke. The patient's score is based upon: CHF History: 1 HTN History: 1 Diabetes History: 0 Stroke History: 0 Vascular Disease History: 1 Age Score: 2 Gender Score: 1           Lab Results  Component Value Date   WBC 4.6 09/26/2022   HGB 12.6 09/26/2022   HCT 38.6 09/26/2022   MCV 96.5 09/26/2022   PLT 180 09/26/2022   Lab Results  Component Value Date   CREATININE 0.83 09/26/2022   BUN 7 (L) 09/26/2022   NA 130 (L) 09/26/2022   K 4.7 09/26/2022   CL 97 (L) 09/26/2022   CO2 23 09/26/2022   Lab Results  Component Value Date   ALT 20 09/26/2022   AST 23 09/26/2022   ALKPHOS 80 09/26/2022   BILITOT 0.9 09/26/2022   Lab  Results  Component Value Date   CHOL 168 03/06/2022   HDL 98 03/06/2022   LDLCALC 59 03/06/2022   TRIG 52 03/06/2022   CHOLHDL 1.7 03/06/2022    Lab Results  Component Value Date   HGBA1C 5.8 (H) 01/30/2015    Assessment & Plan    1.  Paroxysmal AF: -Patient is currently on flecainide 75 mg twice daily and metoprolol 25 mg 3 times per day -Today she is sinus rhythm and rate controlled -She is tolerating Eliquis 5 mg twice daily no complaints of bleeding -CHA2DS2-VASc Score = 6 [CHF History: 1, HTN History: 1, Diabetes History: 0, Stroke History: 0, Vascular Disease History: 1, Age Score: 2,  Gender Score: 1].  Therefore, the patient's annual risk of stroke is 9.7 %.       2.  History of atypical chest pain: -Cardiac CT completed with calcium score of 0 and mild plaquing of the aorta -Continue statin as prescribed and encouraged to report any complaints of angina   3.  Hyperlipidemia: -Patient's last LDL was 59 -Continue Crestor 5 mg daily   4.  Essential hypertension: -Patient's blood pressure today was controlled well-controlled at 130/86 but she reports fluctuations in blood pressures over the last 4 weeks.   -We will increase amlodipine to 2.5 mg twice daily and patient will continue metoprolol as noted above.  5.  Dyspnea on exertion: -Patient reports continued shortness of breath with minimal exertion -Repeat 2D echo to evaluate for structural changes related to new shortness of breath.   Disposition: Follow-up with [name], MD or APP in 3 months   Medication Adjustments/Labs and Tests Ordered: Current medicines are reviewed at length with the patient today.  Concerns regarding medicines are outlined above.   Signed, Mable Fill, Marissa Nestle, NP 09/27/2022, 4:50 PM [facility]  Note:  This document was prepared using Dragon voice recognition software and may include unintentional dictation errors.

## 2022-09-27 NOTE — Telephone Encounter (Signed)
Pt states she saw Ambrose Pancoast NP in the office today for this, and he started her on amlodipine 2.5 mg po in the morning and 2.5 mg po at bedtime.   She states she will proceed with his recommendations and see how she tolerates this and how BP's respond.  She states if her BPs still need support with 5 mg po BID, then she will call Lelon Frohlich RN and Dr. [name] back, so that they can send in the 5 mg tablets of amlodipine to her pharmacy thereafter.   Pt was very appreciative for all the assistance from Dr. [name], and she will keep her posted as needed, if the 5 mg tablets need to be sent in.   Will send this back to Dr. [name] and Lelon Frohlich as an Juluis Rainier.

## 2022-09-27 NOTE — Patient Instructions (Addendum)
Medication Instructions:  INCREASED Norvasc to 2.'5mg'$  take 1 tablet twice a day  TAKE your Lopressor three times a day *If you need a refill on your cardiac medications before your next appointment, please call your pharmacy*   Lab Work: None Ordered   Testing/Procedures: Your physician has requested that you have an echocardiogram. Echocardiography is a painless test that uses sound waves to create images of your heart. It provides your doctor with information about the size and shape of your heart and how well your heart's chambers and valves are working. This procedure takes approximately one hour. There are no restrictions for this procedure. Please do NOT wear cologne, perfume, aftershave, or lotions (deodorant is allowed). Please arrive 15 minutes prior to your appointment time.   Follow-Up: At [facility], you and your health needs are our priority.  As part of our continuing mission to provide you with exceptional heart care, we have created designated Provider Care Teams.  These Care Teams include your primary Cardiologist (physician) and Advanced Practice Providers (APPs -  Physician Assistants and Nurse Practitioners) who all work together to provide you with the care you need, when you need it.  We recommend signing up for the patient portal called "MyChart".  Sign up information is provided on this After Visit Summary.  MyChart is used to connect with patients for Virtual Visits (Telemedicine).  Patients are able to view lab/test results, encounter notes, upcoming appointments, etc.  Non-urgent messages can be sent to your provider as well.   To learn more about what you can do with MyChart, go to [email or web address].    Your next appointment:   AS SCHEDULED  The format for your next appointment:   In Person  Provider:   [name], MD     Other Instructions NURSE VISIT IN 2 WEEKS   Important Information About Sugar

## 2022-09-27 NOTE — Telephone Encounter (Signed)
Follow Up:      Patient called and said she went to the ER yesterday(09-26-22). She said she was not given any medicine at the ER. They told her to check with her Cardiology.She said this morning iblood pressure is  back up high again.    Pt c/o BP issue: STAT if pt c/o blurred vision, one-sided weakness or slurred speech  1. What are your last 5 BP readings? 155/92 155/91  2. Are you having any other symptoms (ex. Dizziness, headache, blurred vision, passed out)? Bad headache- nothing seem to get rid of the headache  What is your BP issue? Blood pressure is high- Patient would like to be seen

## 2022-10-03 ENCOUNTER — Telehealth: Payer: Self-pay | Admitting: Nurse Practitioner

## 2022-10-03 NOTE — Telephone Encounter (Signed)
New Message:     Patient called. She said Jaquelyn Bitter wanted her to call back and let him know how her blood pressure was doing. She said that her bottom number still runs in the 90's. Patient did not give any readings. She says she definitely wants to get this control.

## 2022-10-03 NOTE — Telephone Encounter (Signed)
Called pt in regards to BP readings.  Reports morning DBP remains elevated 90's.  With associated HA. Pt takes BP as soon as gets up.  Reports checks BP 6 hours later and runs around 116/80's. Takes amlodipine 2.5 mg BID started 09/27/22.  Is not taking metoprolol tartrate TID as ordered; taking BID.  Concerned that HR will be to low and cause fatigue.  Reports HR in 70's goes up to 80's with activity.  Reports does not eat any processed or fried foods.  Eats a lot of fresh fruits and vegetables.   Advised pt to start taking metoprolol TID to see if it will give better BP control through the night.  Could potentially help AM BP.  Pt is willing to try.  Will update the office on Friday or Tuesday.    Advised will send to Provider to see if has any further suggestions.

## 2022-10-05 NOTE — Telephone Encounter (Signed)
Amanda Wilkins., NP  to Me     10/05/22  2:42 PM  Please start Amlodipine 5 mg BID. Thanks    Called the pt and endorsed to her that per Ambrose Pancoast NP, we can take her amlodipine up to 5 mg po bid if BP's are still running high.   Pt states she started back taking her metoprolol 25 mg po TID as previous triage RN instructed her to do on 12/20, and now her blood pressures have normalized, and she would like to now proceed with taking that regimen.    Pt states she will stay with the current dose of amlodipine 2.5 mg po bid and will continue with her metoprolol 25 mg po TID, and continue monitoring at home and keep [place] posted as needed.  She said restarting back the metoprolol to her regimen has really done the trick for her pressures.    Pt verbalized understanding and agrees with this plan.  She was more than gracious for all the assistance provided by all parties.

## 2022-10-05 NOTE — Telephone Encounter (Signed)
Amanda Wilkins, Amanda Wilkins - 10/03/2022  8:09 AM Fay Records, MD  Sent: Fri October 05, 2022  4:38 PM  To: Nuala Alpha, LPN         Message  Please make sure pt has follow up with me in Jan sometime    Called the pt and scheduled her for follow-up with Dr. [name] on 11/06/22 at 1100.  Pt aware to arrive 15 mins prior to this appt.  Pt verbalized understanding and agrees with this plan.

## 2022-10-05 NOTE — Telephone Encounter (Signed)
Amanda Wilkins, pt is currently taking amlodipine 2.5 mg po BID.  She is on bid dosing for both morning and afternoon BP coverage.    Dr. [name] mentioned on 12/13 telephone encounter that if 2.5 mg bid dosing didn't get her pressures at goal, then could increase to 5 mg po bid thereafter.  Do you recommend that we increase her now to amlodipine 5 mg po BID dosing or keep her at her current regimen of 2.5 mg po bid?  Pt states she gets better blood pressure coverage for the whole day when she is on BID dosing vs daily dosing of amlodipine.   Please advise and thanks so much!

## 2022-10-11 ENCOUNTER — Ambulatory Visit: Payer: Medicare Other | Admitting: Psychology

## 2022-10-11 ENCOUNTER — Ambulatory Visit: Payer: Medicare Other | Attending: Internal Medicine | Admitting: Internal Medicine

## 2022-10-11 VITALS — BP 118/60 | HR 72 | Ht 65.0 in | Wt 235.2 lb

## 2022-10-11 DIAGNOSIS — I10 Essential (primary) hypertension: Secondary | ICD-10-CM

## 2022-10-11 DIAGNOSIS — I2510 Atherosclerotic heart disease of native coronary artery without angina pectoris: Secondary | ICD-10-CM | POA: Diagnosis not present

## 2022-10-11 NOTE — Progress Notes (Signed)
   Nurse Visit   Date of Encounter: 10/11/2022 ID: Amanda Wilkins, DOB [birth year], MRN [id number]  PCP:  [name], MD   [facility] Cardiologist:  [name], MD      Visit Details   VS:  BP 118/60   Pulse 72   Ht '5\' 5"'$  (1.651 m)   Wt 235 lb 3.2 oz (106.7 kg)   SpO2 99%   BMI 39.14 kg/m  , BMI Body mass index is 39.14 kg/m.  Wt Readings from Last 3 Encounters:  10/11/22 235 lb 3.2 oz (106.7 kg)  09/27/22 230 lb 9.6 oz (104.6 kg)  09/26/22 229 lb 4.5 oz (104 kg)     Reason for visit: BP Check/HTN Performed today: Vitals, Provider consulted:Ernest Dick, NP and DOD Dr. Caryl Comes, and Education Changes (medications, testing, etc.) : NONE Length of Visit: 34 minutes   Patient is here today for a BP check after increasing amlodipine to 2.'5mg'$  BID on 09/27/22. Patient had only been taking metoprolol tartrate '25mg'$  BID instead of the ordered TID. Patient had called in on 10/03/22 concerned about elevated BP. At that time she was advised to take Lopressor '25mg'$  TID as ordered. She reports that since taking her Lopressor as prescribed her BP has been well controlled at home. BP today is 118/60, HR 72. Home BP machine was checked at today's visit with similar reading. She reports she continues to have DOE, no worse than it has been. She is scheduled for an echocardiogram and follow-up appointment with Dr. [name] mid-January. Discussed with Ambrose Pancoast, NP, no changes. Continue current treatment plan. Also discussed with DOD Dr. Caryl Comes, no change to current plan.  Medications Adjustments/Labs and Tests Ordered: No orders of the defined types were placed in this encounter.  No orders of the defined types were placed in this encounter.    Signed, Molli Barrows, RN  10/11/2022 2:46 PM

## 2022-10-11 NOTE — Patient Instructions (Signed)
Medication Instructions:  Your physician recommends that you continue on your current medications as directed. Please refer to the Current Medication list given to you today.  *If you need a refill on your cardiac medications before your next appointment, please call your pharmacy*  Testing/Procedures: Echocardiogram on 10/30/2022.  Follow-Up: At [facility], you and your health needs are our priority.  As part of our continuing mission to provide you with exceptional heart care, we have created designated Provider Care Teams.  These Care Teams include your primary Cardiologist (physician) and Advanced Practice Providers (APPs -  Physician Assistants and Nurse Practitioners) who all work together to provide you with the care you need, when you need it.  Your next appointment:   4 week(s)  The format for your next appointment:   In Person  Provider:   [name], MD  Important Information About Sugar

## 2022-10-19 ENCOUNTER — Other Ambulatory Visit: Payer: Self-pay | Admitting: Internal Medicine

## 2022-10-22 DIAGNOSIS — L820 Inflamed seborrheic keratosis: Secondary | ICD-10-CM | POA: Diagnosis not present

## 2022-10-22 DIAGNOSIS — L309 Dermatitis, unspecified: Secondary | ICD-10-CM | POA: Diagnosis not present

## 2022-10-29 ENCOUNTER — Ambulatory Visit (INDEPENDENT_AMBULATORY_CARE_PROVIDER_SITE_OTHER): Payer: Medicare Other | Admitting: Nurse Practitioner

## 2022-10-29 ENCOUNTER — Encounter: Payer: Self-pay | Admitting: Nurse Practitioner

## 2022-10-29 VITALS — HR 66 | Temp 97.7°F

## 2022-10-29 DIAGNOSIS — R300 Dysuria: Secondary | ICD-10-CM | POA: Diagnosis not present

## 2022-10-29 DIAGNOSIS — N9489 Other specified conditions associated with female genital organs and menstrual cycle: Secondary | ICD-10-CM | POA: Diagnosis not present

## 2022-10-29 DIAGNOSIS — N762 Acute vulvitis: Secondary | ICD-10-CM

## 2022-10-29 LAB — URINALYSIS, COMPLETE W/RFL CULTURE
Bacteria, UA: NONE SEEN /HPF
Bilirubin Urine: NEGATIVE
Glucose, UA: NEGATIVE
Hgb urine dipstick: NEGATIVE
Hyaline Cast: NONE SEEN /LPF
Ketones, ur: NEGATIVE
Leukocyte Esterase: NEGATIVE
Nitrites, Initial: NEGATIVE
RBC / HPF: NONE SEEN /HPF (ref 0–2)
Specific Gravity, Urine: 1.02 (ref 1.001–1.035)
WBC, UA: NONE SEEN /HPF (ref 0–5)
pH: 7 (ref 5.0–8.0)

## 2022-10-29 LAB — NO CULTURE INDICATED

## 2022-10-29 LAB — WET PREP FOR TRICH, YEAST, CLUE

## 2022-10-29 MED ORDER — CLOBETASOL PROPIONATE 0.05 % EX OINT: 1.0000 | TOPICAL_OINTMENT | Freq: Two times a day (BID) | CUTANEOUS | 0 refills | Status: DC

## 2022-10-29 NOTE — Progress Notes (Signed)
   Acute Office Visit  Subjective:    Patient ID: Amanda Wilkins, female    DOB: Mar 17, 1946, 77 y.o.   MRN: [id number]   HPI 77 y.o. presents today for vulvar burning and lower abdominal discomfort x 2 days. Burning is not necessarily associated with urinating but more so all the time especially against clothing. Abdominal pain is lower, bilateral and achy. No changes in soaps or detergents. Denies GI symptoms. H/O a fib, on long-term Eliquis.    Review of Systems  Constitutional: Negative.   Gastrointestinal: Negative.   Genitourinary:  Positive for dysuria, pelvic pain and vaginal pain (Vulvar burning). Negative for flank pain, frequency, genital sores, hematuria, urgency and vaginal discharge.       Objective:    Physical Exam Constitutional:      Appearance: Normal appearance.  Abdominal:     Tenderness: There is no abdominal tenderness.  Genitourinary:    General: Normal vulva.     Vagina: Normal.     Cervix: Normal.     Uterus: Normal.      Adnexa: Right adnexa normal and left adnexa normal.     Comments: Atrophic changes present    Pulse 66   Temp 97.7 F (36.5 C)   SpO2 100%  Wt Readings from Last 3 Encounters:  10/11/22 235 lb 3.2 oz (106.7 kg)  09/27/22 230 lb 9.6 oz (104.6 kg)  09/26/22 229 lb 4.5 oz (104 kg)        Patient informed chaperone available to be present for breast and/or pelvic exam. Patient has requested no chaperone to be present. Patient has been advised what will be completed during breast and pelvic exam.   UA negative Wet prep negative  Assessment & Plan:   Problem List Items Addressed This Visit   None Visit Diagnoses     Acute vulvitis    -  Primary   Relevant Medications   clobetasol ointment (TEMOVATE) 0.05 %   Vulvar burning       Relevant Orders   Urinalysis,Complete w/RFL Culture (Completed)   WET PREP FOR [place], YEAST, CLUE (Completed)      Plan: Negative wet prep, UA and exam. Normal atrophic changes  present. Clobetasol ointment BID x 7-10 days, then begin using coconut oil 1-2 times daily for moisture. We did discuss menopausal changes and management options.       Tamela Gammon DNP, 12:35 PM 10/29/2022

## 2022-10-30 ENCOUNTER — Ambulatory Visit (HOSPITAL_COMMUNITY): Payer: Medicare Other | Attending: Cardiology

## 2022-10-30 DIAGNOSIS — I10 Essential (primary) hypertension: Secondary | ICD-10-CM | POA: Insufficient documentation

## 2022-10-30 DIAGNOSIS — R0602 Shortness of breath: Secondary | ICD-10-CM

## 2022-10-30 LAB — ECHOCARDIOGRAM COMPLETE
Area-P 1/2: 3.14 cm2
S' Lateral: 3 cm

## 2022-10-31 ENCOUNTER — Telehealth: Payer: Self-pay

## 2022-10-31 NOTE — Telephone Encounter (Signed)
Prior authorization has been sent for clobetasol propionate 0.05% ointment.  KEY: [id number]. Waiting on reply back that may take up to 24hrs.

## 2022-10-31 NOTE — Telephone Encounter (Signed)
Prior authorization was approved. Patient aware.

## 2022-11-02 ENCOUNTER — Ambulatory Visit: Payer: Medicare Other | Admitting: Cardiology

## 2022-11-02 NOTE — Progress Notes (Signed)
This encounter was created in error - please disregard.

## 2022-11-04 NOTE — Progress Notes (Unsigned)
Cardiology Office Note   Date:  11/06/2022   ID:  Amanda Wilkins, DOB [birth year], MRN [id number]  PCP:  [name], MD  Cardiologist:   [name], MD    F/u of PAF     History of Present Illness: Amanda Wilkins is a 77 y.o. female with a history of chest pain, near syncope, HTN, obesity, OSA (on CPAP since 2019), mild CV dz, HL, aortic atherosclerosis,  Echo in 2016 showed normal LVEF Carotid USN showed mild plaquing   Holter showed intermitt afib/SVT (first Dx 2010)  Recomm flecanide; pt declined Rx Pt has had a long Hx of SOB Myovue (2016) showed inferolateral and inferior defect consistent with ischemia  CT calcium score (2020) 0 PFTs done for dyspnea were normal OSA Pt started on CPAP in 2019  Atrial flutter 2019   Spontaneously converted  Echo in 2021 normal LVEF /RVEF CPX done in Aug 2021.  Limitation felt due to body weight  Pt enrolled into [employer] and felt much better with no signif dyspnea  First documented afib with 2022 Started on Flecanide  Underwent DCCV 08/2021.  [name]  Converted to SR but reverted back to afib before d/c from the endo unit.    Flecanide was increased to 75 bid    plan for cardioversoin  She converted on own with increased dose      -October 2023   SOB and weak, complained of heart racing   No arrhythmia documented   -Dec 2023   Went to ER   Headache   BP 170s/   Pt continued to noteBP fluctuation. Amlodipine increased to 2.5 bid    2D echo ordered for continued SOB   Since seen in clnic in Dec she says her BP is better   She still complains of SOB with exertion.   Denies CP   Denies palpitations    Current Meds  Medication Sig   acetaminophen (TYLENOL) 500 MG tablet Take 1,000 mg by mouth daily as needed for moderate pain or mild pain (pain).   ALPRAZolam (XANAX) 0.25 MG tablet Take 1 tablet (0.25 mg total) by mouth 2 (two) times daily as needed for anxiety.   cholecalciferol (VITAMIN D3) 25 MCG (1000 UT) tablet Take 1,000 Units by  mouth daily.   clobetasol ointment (TEMOVATE) 6.30 % Apply 1 Application topically 2 (two) times daily.   EPINEPHrine 0.3 mg/0.3 mL IJ SOAJ injection Inject 0.3 mg into the muscle as needed for anaphylaxis. As needed for life-threatening allergic reactions   famotidine (PEPCID) 40 MG tablet Take 1 tablet (40 mg total) by mouth daily.   hydroxychloroquine (PLAQUENIL) 200 MG tablet Take 400 mg by mouth every morning.   Probiotic Product (ALIGN) 4 MG CAPS Take 4 mg by mouth daily.   rosuvastatin (CRESTOR) 5 MG tablet TAKE 1 TABLET(5 MG) BY MOUTH DAILY   sertraline (ZOLOFT) 50 MG tablet TAKE 1 TABLET BY MOUTH DAILY   tacrolimus (PROTOPIC) 0.1 % ointment SMARTSIG:sparingly Topical Daily   [DISCONTINUED] amLODipine (NORVASC) 2.5 MG tablet Take 1 tablet (2.5 mg total) by mouth in the morning and at bedtime.   [DISCONTINUED] apixaban (ELIQUIS) 5 MG TABS tablet TAKE 1 TABLET(5 MG) BY MOUTH TWICE DAILY   [DISCONTINUED] flecainide (TAMBOCOR) 50 MG tablet Take 1.5 tablets (75 mg total) by mouth 2 (two) times daily.   [DISCONTINUED] metoprolol tartrate (LOPRESSOR) 25 MG tablet Take 1 tablet (25 mg total) by mouth 3 (three) times daily. CAN TAKE AN ADDITIONAL TAB AS NEEDED FOR  PALPITATIONS     Allergies:   Epinephrine base, Aspirin, Benadryl [diphenhydramine], Covid-19 ad26 vaccine(janssen), Fish allergy, Hylan g-f 20, Penicillins, Tape, and Wound dressing adhesive   Past Medical History:  Diagnosis Date   Acute bronchitis 09/11/2016   11/17 refractory   Acute cystitis without hematuria 05/17/2015   Acute kidney injury [facility])    Labs today   Acute right ankle pain 09/09/2018   Anticoagulant long-term use    Xarelto   Axillary pain    Bronchitis    Cellulitis    Cholecystitis    Chronic interstitial nephritis    CONJUNCTIVITIS, ACUTE 10/18/2010   Qualifier: Diagnosis of  By: Plotnikov MD, Evie Lacks    D-dimer, elevated    Depression    Eye inflammation    GERD (gastroesophageal reflux disease)     History of interstitial nephritis 2016   chronic   History of kidney stones    has a small one found on xray   History of nuclear stress test 12/16/2014   Intermediate risk nuclear study w/ medium size moderate severity reversible defect in the basal and mid inferolateral and inferior wall (per dr cardiology note , dr [name] did not think this was consistent with ischemia)/  normal LV function and wall motion, ef 75%   History of septic shock 01/22/2015   in setting Group A Strep Cellulits erysipelas/chest wall induration with streptoccocus basterium-- Severe sepsis, DIC, Acute respiratory failure with pulmonary edema, Acute Kidney failure with chronic interstitial nephritis   Hypertension    Hyponatremia    Migraines    on Zoloft for migraines   Mild carotid artery disease ([place])    per duplex 08-30-2017 bilateral ICA 1-39%   OA (osteoarthritis) rheumotologist-  dr [name]   both knees,  shoulders, ankles   OSA on CPAP     moderate obstructive sleep apnea with an AHI of 23.4/h and oxygen desaturations as low as 84%.  Now on CPAP at 12 cm H2O.   PAF (paroxysmal atrial fibrillation) [facility]) 2009   cardiologist-- dr [name]   Paroxysmal atrial flutter [facility])    a. dx 11/2017.   PSVT (paroxysmal supraventricular tachycardia)    RA (rheumatoid arthritis) ([place])    Wears contact lenses     Past Surgical History:  Procedure Laterality Date   BREAST BIOPSY Right 05/15/2017   [place]   CARDIOVERSION N/A 08/28/2021   Procedure: CARDIOVERSION;  Surgeon: Fay Records, MD;  Location: [facility] ENDOSCOPY;  Service: Cardiovascular;  Laterality: N/A;   [place] N/A 11/16/2021   Procedure: LAPAROSCOPIC CHOLECYSTECTOMY;  Surgeon: Ralene Ok, MD;  Location: [place];  Service: General;  Laterality: N/A;   ESOPHAGOGASTRODUODENOSCOPY N/A 02/08/2015   Procedure: ESOPHAGOGASTRODUODENOSCOPY (EGD);  Surgeon: [name], MD;  Location: [place];  Service:  Endoscopy;  Laterality: N/A;   KNEE ARTHROSCOPY W/ MENISCAL REPAIR Left 08/2014    '@[facility]'$    SHOULDER SURGERY Right 04/04/2016   TOTAL HIP ARTHROPLASTY Right 06/19/2022   Procedure: RIGHT TOTAL HIP ARTHROPLASTY ANTERIOR APPROACH;  Surgeon: Mcarthur Rossetti, MD;  Location: [place];  Service: Orthopedics;  Laterality: Right;   TOTAL KNEE ARTHROPLASTY Right 09/01/2018   Procedure: RIGHT TOTAL KNEE ARTHROPLASTY;  Surgeon: [name], MD;  Location: [facility];  Service: Orthopedics;  Laterality: Right;     Social History:  The patient  reports that she has never smoked. She has never used smokeless tobacco. She reports that she does not currently use alcohol.  She reports that she does not use drugs.   Family History:  The patient's family history includes Allergic rhinitis in her sister; Breast cancer (age of onset: 19) in her sister; Lymphoma in her mother; Pancreatic cancer in her brother; Prostate cancer in her father; Urticaria in her sister.    ROS:  Please see the history of present illness. All other systems are reviewed and  Negative to the above problem except as noted.    PHYSICAL EXAM: VS:  BP 118/74   Pulse 85   Ht '5\' 5"'$  (1.651 m)   Wt 233 lb 3.2 oz (105.8 kg)   SpO2 97%   BMI 38.81 kg/m   GEN: Obese 77 yo  in no acute distress  HEENT: normal  Neck: JVP is normal  Cardiac: RRR; no murmur  , No  LE  edema  Respiratory:  clear to auscultation GI: soft, nontender, nondistended, + BS  No hepatomegaly  MS: no deformity Moving all extremities   Skin: warm and dry Neuro:  Grossly intact   Psych: euthymic mood, full affect   EKG:  EKG is not ordered today.    Echo   Dec 2023     1. Left ventricular ejection fraction, by estimation, is 55 to 60%. The  left ventricle has normal function. The left ventricle has no regional  wall motion abnormalities. There is mild concentric left ventricular  hypertrophy. Left ventricular diastolic  parameters are consistent with Grade II  diastolic dysfunction  (pseudonormalization).   2. Right ventricular systolic function is normal. The right ventricular  size is mildly enlarged. There is mildly elevated pulmonary artery  systolic pressure. The estimated right ventricular systolic pressure is  38.7 mmHg.   3. Left atrial size was moderately dilated.   4. Right atrial size was moderately dilated.   5. The mitral valve is normal in structure. Mild mitral valve  regurgitation. No evidence of mitral stenosis.   6. Tricuspid valve regurgitation is mild to moderate.   7. The aortic valve is tricuspid. There is mild calcification of the  aortic valve. Aortic valve regurgitation is not visualized. No aortic  stenosis is present.   8. The inferior vena cava is normal in size with greater than 50%  respiratory variability, suggesting right atrial pressure of 3 mmHg.  Lipid Panel    Component Value Date/Time   CHOL 168 03/06/2022 1143   TRIG 52 03/06/2022 1143   HDL 98 03/06/2022 1143   CHOLHDL 1.7 03/06/2022 1143   CHOLHDL 2 05/28/2018 0837   VLDL 13.8 05/28/2018 0837   LDLCALC 59 03/06/2022 1143      Wt Readings from Last 3 Encounters:  11/06/22 233 lb 3.2 oz (105.8 kg)  10/11/22 235 lb 3.2 oz (106.7 kg)  09/27/22 230 lb 9.6 oz (104.6 kg)    Echo: ( Jan 2024) 1. Left ventricular ejection fraction, by estimation, is 55 to 60%. The  left ventricle has normal function. The left ventricle has no regional  wall motion abnormalities. There is mild concentric left ventricular  hypertrophy. Left ventricular diastolic  parameters are consistent with Grade II diastolic dysfunction  (pseudonormalization).   2. Right ventricular systolic function is normal. The right ventricular  size is mildly enlarged. There is mildly elevated pulmonary artery  systolic pressure. The estimated right ventricular systolic pressure is  56.4 mmHg.   3. Left atrial size was moderately dilated.   4. Right atrial size was moderately dilated.    5. The mitral valve is  normal in structure. Mild mitral valve  regurgitation. No evidence of mitral stenosis.   6. Tricuspid valve regurgitation is mild to moderate.   7. The aortic valve is tricuspid. There is mild calcification of the  aortic valve. Aortic valve regurgitation is not visualized. No aortic  stenosis is present.   8. The inferior vena cava is normal in size with greater than 50%  respiratory variability, suggesting right atrial pressure of 3 mmHg.     ASSESSMENT AND PLAN  1  DOE   This is the biggest complaint the pt has   The period of time when the pt felt the best (felt great) was when she was goiing to the gym (had a group to exercise with )  This wsa before her gallbladder surgery. She has diastolic dysfunction on Echo    I would recomm restarting exercise program   Get moving      She will be going to [place] soon.  Will do a lot of walking  Could consider Jardiance but I would try walking for now    She is not SOB at rest   On many other meds   2 PAF  Appears to be doing OK   No complaints of tachypalpitations   Contnue tambocor and Eliquis   3   HTN   BP is controlled now that back on amldoipine    4  Hx diastolic dysfunction. Volume status appears OK    5   Hx CP    Denies CP   Ca score is 0   Pt has mild plaquing of aorta  Keep on Crestor    6 CV dz Mild plaquing  Continjue statin    7 HL  Keep on Crestor given atherosclerosis of aorta.    LDL 59  HDL 98       Current medicines are reviewed at length with the patient today.  The patient does not have concerns regarding medicines.  Signed, [name], MD  11/06/2022 11:22 PM    [facility] [address], [place], [place]  [address] Phone: [phone]; Fax: [phone]

## 2022-11-04 NOTE — Progress Notes (Deleted)
Virtual Visit via Video Note   This visit type was conducted due to national recommendations for restrictions regarding the COVID-19 Pandemic (e.g. social distancing) in an effort to limit this patient's exposure and mitigate transmission in our community.  Due to her co-morbid illnesses, this patient is at least at moderate risk for complications without adequate follow up.  This format is felt to be most appropriate for this patient at this time.  All issues noted in this document were discussed and addressed.  A limited physical exam was performed with this format.  Please refer to the patient's chart for her consent to telehealth for [facility].   Evaluation Performed:  Follow-up visit  Date:  11/04/2022   ID:  Amanda Wilkins, DOB [birth year], MRN [id number]  Patient Location:  Home  Provider location:   [place]  PCP:  Plotnikov, Evie Lacks, MD  Cardiologist:  [name], MD  Sleep Medicine:  Fransico Him, MD Electrophysiologist:  None   Chief Complaint:  OSA  History of Present Illness:    Amanda Wilkins is a 77 y.o. female who presents via audio/video conferencing for a telehealth visit today.    Amanda Wilkins is a 76y.o. female with a hx of paroxysmal atrial fibrillation and flutter, hypertension and sleep apnea. Due to ongoing hypertension, snoring atrial fibrillation sleep study was ordered showing moderate obstructive sleep apnea with an AHI of 23.4/h and oxygen desaturations as low as 84%.  She subsequently underwent CPAP titration to 12 cm H2O.    She is doing well with her PAP device and thinks that she has gotten used to it.  She tolerates the mask and feels the pressure is adequate.  Since going on PAP she feels rested in the am and has no significant daytime sleepiness.  She denies any significant mouth or nasal dryness or nasal congestion.  She does not think that he snores.   Prior CV studies:   The following studies were reviewed today:  PAP  compliance download  Past Medical History:  Diagnosis Date   Acute bronchitis 09/11/2016   11/17 refractory   Acute cystitis without hematuria 05/17/2015   Acute kidney injury [facility])    Labs today   Acute right ankle pain 09/09/2018   Anticoagulant long-term use    Xarelto   Axillary pain    Bronchitis    Cellulitis    Cholecystitis    Chronic interstitial nephritis    CONJUNCTIVITIS, ACUTE 10/18/2010   Qualifier: Diagnosis of  By: Plotnikov MD, Evie Lacks    D-dimer, elevated    Depression    Eye inflammation    GERD (gastroesophageal reflux disease)    History of interstitial nephritis 2016   chronic   History of kidney stones    has a small one found on xray   History of nuclear stress test 12/16/2014   Intermediate risk nuclear study w/ medium size moderate severity reversible defect in the basal and mid inferolateral and inferior wall (per dr cardiology note , dr [name] did not think this was consistent with ischemia)/  normal LV function and wall motion, ef 75%   History of septic shock 01/22/2015   in setting Group A Strep Cellulits erysipelas/chest wall induration with streptoccocus basterium-- Severe sepsis, DIC, Acute respiratory failure with pulmonary edema, Acute Kidney failure with chronic interstitial nephritis   Hypertension    Hyponatremia    Migraines    on Zoloft for migraines   Mild carotid artery disease ([place])  per duplex 08-30-2017 bilateral ICA 1-39%   OA (osteoarthritis) rheumotologist-  dr [name]   both knees,  shoulders, ankles   OSA on CPAP     moderate obstructive sleep apnea with an AHI of 23.4/h and oxygen desaturations as low as 84%.  Now on CPAP at 12 cm H2O.   PAF (paroxysmal atrial fibrillation) [facility]) 2009   cardiologist-- dr [name]   Paroxysmal atrial flutter [facility])    a. dx 11/2017.   PSVT (paroxysmal supraventricular tachycardia)    RA (rheumatoid arthritis) ([place])    Wears contact lenses    Past Surgical History:   Procedure Laterality Date   BREAST BIOPSY Right 05/15/2017   [place]   CARDIOVERSION N/A 08/28/2021   Procedure: CARDIOVERSION;  Surgeon: Fay Records, MD;  Location: [facility] ENDOSCOPY;  Service: Cardiovascular;  Laterality: N/A;   [place] N/A 11/16/2021   Procedure: LAPAROSCOPIC CHOLECYSTECTOMY;  Surgeon: Ralene Ok, MD;  Location: [place];  Service: General;  Laterality: N/A;   ESOPHAGOGASTRODUODENOSCOPY N/A 02/08/2015   Procedure: ESOPHAGOGASTRODUODENOSCOPY (EGD);  Surgeon: [name], MD;  Location: [place];  Service: Endoscopy;  Laterality: N/A;   KNEE ARTHROSCOPY W/ MENISCAL REPAIR Left 08/2014    '@[facility]'$    SHOULDER SURGERY Right 04/04/2016   TOTAL HIP ARTHROPLASTY Right 06/19/2022   Procedure: RIGHT TOTAL HIP ARTHROPLASTY ANTERIOR APPROACH;  Surgeon: Mcarthur Rossetti, MD;  Location: [place];  Service: Orthopedics;  Laterality: Right;   TOTAL KNEE ARTHROPLASTY Right 09/01/2018   Procedure: RIGHT TOTAL KNEE ARTHROPLASTY;  Surgeon: [name], MD;  [facility];  Service: Orthopedics;  Laterality: Right;     No outpatient medications have been marked as taking for the 11/13/22 encounter (Appointment) with Sueanne Margarita, MD.     Allergies:   Epinephrine base, Aspirin, Benadryl [diphenhydramine], Covid-19 ad26 vaccine(janssen), Fish allergy, Hylan g-f 20, Penicillins, Tape, and Wound dressing adhesive   Social History   Tobacco Use   Smoking status: Never   Smokeless tobacco: Never  Vaping Use   Vaping Use: Never used  Substance Use Topics   Alcohol use: Not Currently    Comment: Occasional   Drug use: Never     Family Hx: The patient's family history includes Allergic rhinitis in her sister; Breast cancer (age of onset: 44) in her sister; Lymphoma in her mother; Pancreatic cancer in her brother; Prostate cancer in her father; Urticaria in her sister. There is no history of Asthma, Eczema, Immunodeficiency, Atopy, or  Angioedema.  ROS:   Please see the history of present illness.     All other systems reviewed and are negative.   Labs/Other Tests and Data Reviewed:    Recent Labs: 01/02/2022: TSH 4.86 08/24/2022: NT-Pro BNP 325 09/26/2022: ALT 20; BUN 7; Creatinine, Ser 0.83; Hemoglobin 12.6; Platelets 180; Potassium 4.7; Sodium 130   Recent Lipid Panel Lab Results  Component Value Date/Time   CHOL 168 03/06/2022 11:43 AM   TRIG 52 03/06/2022 11:43 AM   HDL 98 03/06/2022 11:43 AM   CHOLHDL 1.7 03/06/2022 11:43 AM   CHOLHDL 2 05/28/2018 08:37 AM   LDLCALC 59 03/06/2022 11:43 AM    Wt Readings from Last 3 Encounters:  10/11/22 235 lb 3.2 oz (106.7 kg)  09/27/22 230 lb 9.6 oz (104.6 kg)  09/26/22 229 lb 4.5 oz (104 kg)     Objective:    Vital Signs:  There were no vitals taken for this visit.   Well nourished, well developed  female in no acute distress. Well appearing, alert and conversant, regular work of breathing,  good skin color  Eyes- anicteric mouth- oral mucosa is pink  neuro- grossly intact skin- no apparent rash or lesions or cyanosis ASSESSMENT & PLAN:    1.  OSA - The patient is tolerating PAP therapy well without any problems. The PAP download performed by his DME was personally reviewed and interpreted by me today and showed an AHI of 1.6 /hr on 12 cm H2O with 97 % compliance in using more than 4 hours nightly.  The patient has been using and benefiting from PAP use and will continue to benefit from therapy.    2.  Hypertension  -BP is controlled -Continue prescription drug management with atenolol 50 mg daily with as needed refills Time:   Today, I have spent 15 minutes on telemedicine discussing medical problems including OSA, HTN and reviewing patient's chart including PAP compliance download.  Medication Adjustments/Labs and Tests Ordered: Current medicines are reviewed at length with the patient today.  Concerns regarding medicines are outlined above.  Tests  Ordered: No orders of the defined types were placed in this encounter.   Medication Changes: No orders of the defined types were placed in this encounter.    Disposition:  Follow up in 1 year(s)  Signed, Fransico Him, MD  11/04/2022 4:18 PM    [facility]

## 2022-11-06 ENCOUNTER — Ambulatory Visit: Payer: Medicare Other | Attending: Internal Medicine | Admitting: Internal Medicine

## 2022-11-06 ENCOUNTER — Ambulatory Visit: Payer: Medicare Other | Admitting: Internal Medicine

## 2022-11-06 ENCOUNTER — Encounter: Payer: Self-pay | Admitting: Internal Medicine

## 2022-11-06 DIAGNOSIS — I4819 Other persistent atrial fibrillation: Secondary | ICD-10-CM | POA: Diagnosis not present

## 2022-11-06 MED ORDER — METOPROLOL TARTRATE 25 MG PO TABS: 25.0000 mg | ORAL_TABLET | Freq: Three times a day (TID) | ORAL | 3 refills | Status: DC

## 2022-11-06 MED ORDER — FLECAINIDE ACETATE 50 MG PO TABS: 75.0000 mg | ORAL_TABLET | Freq: Two times a day (BID) | ORAL | 3 refills | Status: DC

## 2022-11-06 MED ORDER — APIXABAN 5 MG PO TABS: ORAL_TABLET | ORAL | 11 refills | Status: DC

## 2022-11-06 MED ORDER — AMLODIPINE BESYLATE 2.5 MG PO TABS: 2.5000 mg | ORAL_TABLET | Freq: Two times a day (BID) | ORAL | 3 refills | Status: DC

## 2022-11-06 NOTE — Patient Instructions (Signed)
Medication Instructions:   *If you need a refill on your cardiac medications before your next appointment, please call your pharmacy*   Lab Work: BMET, PRO BNP  If you have labs (blood work) drawn today and your tests are completely normal, you will receive your results only by: [place] (if you have MyChart) OR A paper copy in the mail If you have any lab test that is abnormal or we need to change your treatment, we will call you to review the results.   Testing/Procedures:    Follow-Up: At [facility], you and your health needs are our priority.  As part of our continuing mission to provide you with exceptional heart care, we have created designated Provider Care Teams.  These Care Teams include your primary Cardiologist (physician) and Advanced Practice Providers (APPs -  Physician Assistants and Nurse Practitioners) who all work together to provide you with the care you need, when you need it.  We recommend signing up for the patient portal called "MyChart".  Sign up information is provided on this After Visit Summary.  MyChart is used to connect with patients for Virtual Visits (Telemedicine).  Patients are able to view lab/test results, encounter notes, upcoming appointments, etc.  Non-urgent messages can be sent to your provider as well.   To learn more about what you can do with MyChart, go to [email or web address].    Your next appointment:   9 month(s)  Provider:   [name], MD     Other Instructions

## 2022-11-07 LAB — BASIC METABOLIC PANEL
BUN/Creatinine Ratio: 21 (ref 12–28)
BUN: 19 mg/dL (ref 8–27)
CO2: 23 mmol/L (ref 20–29)
Calcium: 9.9 mg/dL (ref 8.7–10.3)
Chloride: 93 mmol/L — ABNORMAL LOW (ref 96–106)
Creatinine, Ser: 0.92 mg/dL (ref 0.57–1.00)
Glucose: 103 mg/dL — ABNORMAL HIGH (ref 70–99)
Potassium: 4.8 mmol/L (ref 3.5–5.2)
Sodium: 131 mmol/L — ABNORMAL LOW (ref 134–144)
eGFR: 65 mL/min/{1.73_m2} (ref >59)

## 2022-11-07 LAB — PRO B NATRIURETIC PEPTIDE: NT-Pro BNP: 400 pg/mL (ref 0–738)

## 2022-11-13 ENCOUNTER — Ambulatory Visit: Payer: Medicare Other | Admitting: Cardiology

## 2022-11-14 ENCOUNTER — Telehealth: Payer: Self-pay

## 2022-11-14 NOTE — Telephone Encounter (Signed)
Virtual visit rescheduled for February as patient now has availability for February where she did not before. Visit originally scheduled in January and rescheduled to March. Patient with concerns about cpap device.

## 2022-11-15 ENCOUNTER — Encounter: Payer: Self-pay | Admitting: Internal Medicine

## 2022-11-19 ENCOUNTER — Other Ambulatory Visit: Payer: Self-pay | Admitting: Obstetrics & Gynecology

## 2022-11-19 DIAGNOSIS — Z1231 Encounter for screening mammogram for malignant neoplasm of breast: Secondary | ICD-10-CM

## 2022-11-21 ENCOUNTER — Ambulatory Visit
Admission: RE | Admit: 2022-11-21 | Discharge: 2022-11-21 | Disposition: A | Discharge status: Home / Self Care | Payer: Medicare Other | Source: Ambulatory Visit | Attending: Obstetrics & Gynecology | Admitting: Obstetrics & Gynecology

## 2022-11-21 DIAGNOSIS — Z1231 Encounter for screening mammogram for malignant neoplasm of breast: Secondary | ICD-10-CM | POA: Diagnosis not present

## 2022-11-22 ENCOUNTER — Encounter (HOSPITAL_COMMUNITY): Payer: Self-pay | Admitting: *Deleted

## 2022-11-22 ENCOUNTER — Telehealth: Payer: Self-pay | Admitting: Orthopaedic Surgery

## 2022-11-22 NOTE — Telephone Encounter (Signed)
Patient states she wants Dr. Ninfa Linden to tell her who to see for bicep pain and tightness

## 2022-11-26 DIAGNOSIS — M19042 Primary osteoarthritis, left hand: Secondary | ICD-10-CM | POA: Diagnosis not present

## 2022-11-26 DIAGNOSIS — M67442 Ganglion, left hand: Secondary | ICD-10-CM | POA: Diagnosis not present

## 2022-11-26 DIAGNOSIS — M6740 Ganglion, unspecified site: Secondary | ICD-10-CM | POA: Diagnosis not present

## 2022-11-27 ENCOUNTER — Telehealth: Payer: Self-pay | Admitting: Internal Medicine

## 2022-11-27 NOTE — Telephone Encounter (Signed)
Patient states that her husband tested positive for Covid last night and she is starting to be symptomatic.She has not tested - wants to know if there is anything OTC that she can take for her symptoms. Call back [phone].

## 2022-11-28 ENCOUNTER — Telehealth: Payer: Self-pay | Admitting: Internal Medicine

## 2022-11-28 ENCOUNTER — Telehealth (INDEPENDENT_AMBULATORY_CARE_PROVIDER_SITE_OTHER): Payer: Medicare Other | Admitting: Internal Medicine

## 2022-11-28 ENCOUNTER — Encounter: Payer: Self-pay | Admitting: Internal Medicine

## 2022-11-28 VITALS — BP 134/76 | HR 77 | Temp 98.0°F | Ht 65.0 in | Wt 233.0 lb

## 2022-11-28 DIAGNOSIS — J988 Other specified respiratory disorders: Secondary | ICD-10-CM

## 2022-11-28 DIAGNOSIS — Z7901 Long term (current) use of anticoagulants: Secondary | ICD-10-CM | POA: Diagnosis not present

## 2022-11-28 DIAGNOSIS — U071 COVID-19: Secondary | ICD-10-CM

## 2022-11-28 MED ORDER — NIRMATRELVIR/RITONAVIR (PAXLOVID)TABLET: ORAL_TABLET | ORAL | 0 refills | Status: DC

## 2022-11-28 NOTE — Telephone Encounter (Signed)
For a mild COVID-19 case - take zinc 50 mg a day for 1 week, vitamin C 1000 mg daily for 1 week, vitamin D2 50,000 units weekly for 2 months (unless  taking vitamin D daily already), an antioxidant Quercetin 500 mg twice a day for 1 week (if you can get it quick enough). Take Allegra or Benadryl.  Maintain good oral hydration and take Tylenol for high fever.  Call if problems. Isolate for 5 days, then wear a mask for 5 days per CDC. Do a COVID test if he becomes ill. Virtual or office visit if problems.  Thank you

## 2022-11-28 NOTE — Telephone Encounter (Signed)
Paxlovid is contraindicated to use with her flecainide. Would see if MD who prescribed her Paxlovid would prescribe her molnupiravir instead to avoid multiple drug interactions with Paxlovid and her meds.

## 2022-11-28 NOTE — Telephone Encounter (Signed)
Called patient with PharmD's recommendations. Will send message to provider who prescribed paxlovid and see if they can change it to molnupiravir. Will also send message to Dr. [name].

## 2022-11-28 NOTE — Telephone Encounter (Signed)
Pt c/o medication issue:  1. Name of Medication: apixaban (ELIQUIS) 5 MG TABS tablet   2. How are you currently taking this medication (dosage and times per day)? As prescribed   3. Are you having a reaction (difficulty breathing--STAT)? No   4. What is your medication issue? Patient is calling stating she was diagnosed with Covid today. She has been prescribed paxlovid for it and was advised to decrease her eliquis to 1/2 a tablet twice a day. Patient is wanting to confirm this is okay. Please advise.

## 2022-11-28 NOTE — Progress Notes (Signed)
Virtual Visit via Video Note  I connected with Amanda Wilkins on 11/28/22 at 12:15 PM EST by a video enabled telemedicine application and verified that I am speaking with the correct person using two identifiers. Location patient: home Location provider:work office Persons participating in the virtual visit: patient, provider  Patient aware  of the limitations of evaluation and management by telemedicine and  availability of in person appointments. and agreed to proceed.   HPI: Amanda Wilkins presents for video visit because of respiratory symptoms with a positive home COVID test. Husband developed upper respiratory congestion symptoms and tested positive for COVID he was placed on a medication that was not Paxlovid is feeling better she then developed cough congestion headache fatigue without shaking chills significant respiratory distress.  She tested positive home test for COVID day 3+. Interested in medication intervention or advice. History of multiple vaccinations had hives with the last one a year ago no documented history of COVID 19 infection. No current chest pain abnormal shortness of breath She is on anticoagulation for history of A-fib.  No active bleeding.    ROS: See pertinent positives and negatives per HPI.  Past Medical History:  Diagnosis Date   Acute bronchitis 09/11/2016   11/17 refractory   Acute cystitis without hematuria 05/17/2015   Acute kidney injury [facility])    Labs today   Acute right ankle pain 09/09/2018   Anticoagulant long-term use    Xarelto   Axillary pain    Bronchitis    Cellulitis    Cholecystitis    Chronic interstitial nephritis    CONJUNCTIVITIS, ACUTE 10/18/2010   Qualifier: Diagnosis of  By: Plotnikov MD, Evie Lacks    D-dimer, elevated    Depression    Eye inflammation    GERD (gastroesophageal reflux disease)    History of interstitial nephritis 2016   chronic   History of kidney stones    has a small one found on xray    History of nuclear stress test 12/16/2014   Intermediate risk nuclear study w/ medium size moderate severity reversible defect in the basal and mid inferolateral and inferior wall (per dr cardiology note , dr [name] did not think this was consistent with ischemia)/  normal LV function and wall motion, ef 75%   History of septic shock 01/22/2015   in setting Group A Strep Cellulits erysipelas/chest wall induration with streptoccocus basterium-- Severe sepsis, DIC, Acute respiratory failure with pulmonary edema, Acute Kidney failure with chronic interstitial nephritis   Hypertension    Hyponatremia    Migraines    on Zoloft for migraines   Mild carotid artery disease ([place])    per duplex 08-30-2017 bilateral ICA 1-39%   OA (osteoarthritis) rheumotologist-  dr [name]   both knees,  shoulders, ankles   OSA on CPAP     moderate obstructive sleep apnea with an AHI of 23.4/h and oxygen desaturations as low as 84%.  Now on CPAP at 12 cm H2O.   PAF (paroxysmal atrial fibrillation) [facility]) 2009   cardiologist-- dr [name]   Paroxysmal atrial fl[facility])    a. dx 11/2017.   PSVT (paroxysmal supraventricular tachycardia)    RA (rheumatoid arthritis) ([place])    Wears contact lenses     Past Surgical History:  Procedure Laterality Date   BREAST BIOPSY Right 05/15/2017   [place]   CARDIOVERSION N/A 08/28/2021   Procedure: CARDIOVERSION;  Surgeon: Fay Records, MD;  Location: [place];  Service: Cardiovascular;  Laterality: N/A;  [place] N/A 11/16/2021   Procedure: LAPAROSCOPIC CHOLECYSTECTOMY;  Surgeon: Ralene Ok, MD;  Location: [place];  Service: General;  Laterality: N/A;   ESOPHAGOGASTRODUODENOSCOPY N/A 02/08/2015   Procedure: ESOPHAGOGASTRODUODENOSCOPY (EGD);  Surgeon: [name], MD;  Location: [place];  Service: Endoscopy;  Laterality: N/A;   KNEE ARTHROSCOPY W/ MENISCAL REPAIR Left 08/2014    @[facility]$    SHOULDER SURGERY Right  04/04/2016   TOTAL HIP ARTHROPLASTY Right 06/19/2022   Procedure: RIGHT TOTAL HIP ARTHROPLASTY ANTERIOR APPROACH;  Surgeon: Mcarthur Rossetti, MD;  Location: [place];  Service: Orthopedics;  Laterality: Right;   TOTAL KNEE ARTHROPLASTY Right 09/01/2018   Procedure: RIGHT TOTAL KNEE ARTHROPLASTY;  Surgeon: [name], MD;  Location: [facility];  Service: Orthopedics;  Laterality: Right;    Family History  Problem Relation Age of Onset   Pancreatic cancer Brother    Lymphoma Mother    Prostate cancer Father        metastatic prostate cancer   Breast cancer Sister 95   Allergic rhinitis Sister    Urticaria Sister    Asthma Neg Hx    Eczema Neg Hx    Immunodeficiency Neg Hx    Atopy Neg Hx    Angioedema Neg Hx     Social History   Tobacco Use   Smoking status: Never   Smokeless tobacco: Never  Vaping Use   Vaping Use: Never used  Substance Use Topics   Alcohol use: Not Currently    Comment: Occasional   Drug use: Never      Current Outpatient Medications:    acetaminophen (TYLENOL) 500 MG tablet, Take 1,000 mg by mouth daily as needed for moderate pain or mild pain (pain)., Disp: , Rfl:    ALPRAZolam (XANAX) 0.25 MG tablet, Take 1 tablet (0.25 mg total) by mouth 2 (two) times daily as needed for anxiety., Disp: 60 tablet, Rfl: 3   amLODipine (NORVASC) 2.5 MG tablet, Take 1 tablet (2.5 mg total) by mouth in the morning and at bedtime., Disp: 180 tablet, Rfl: 3   apixaban (ELIQUIS) 5 MG TABS tablet, TAKE 1 TABLET(5 MG) BY MOUTH TWICE DAILY, Disp: 60 tablet, Rfl: 11   cholecalciferol (VITAMIN D3) 25 MCG (1000 UT) tablet, Take 1,000 Units by mouth daily., Disp: , Rfl:    EPINEPHrine 0.3 mg/0.3 mL IJ SOAJ injection, Inject 0.3 mg into the muscle as needed for anaphylaxis. As needed for life-threatening allergic reactions, Disp: 2 each, Rfl: 1   flecainide (TAMBOCOR) 50 MG tablet, Take 1.5 tablets (75 mg total) by mouth 2 (two) times daily., Disp: 270 tablet, Rfl: 3    hydroxychloroquine (PLAQUENIL) 200 MG tablet, Take 400 mg by mouth every morning., Disp: , Rfl:    metoprolol tartrate (LOPRESSOR) 25 MG tablet, Take 1 tablet (25 mg total) by mouth 3 (three) times daily. CAN TAKE AN ADDITIONAL TAB AS NEEDED FOR PALPITATIONS, Disp: 90 tablet, Rfl: 3   nirmatrelvir/ritonavir (PAXLOVID) 20 x 150 MG & 10 x 100MG TABS, Patient GFR is 62  Take nirmatrelvir (150 mg) 2 tablet(s) twice daily for 5 days and ritonavir (100 mg) one tablet twice daily for 5 days.take  Medication together., Disp: 30 tablet, Rfl: 0   Probiotic Product (ALIGN) 4 MG CAPS, Take 4 mg by mouth daily., Disp: , Rfl:    rosuvastatin (CRESTOR) 5 MG tablet, TAKE 1 TABLET(5 MG) BY MOUTH DAILY, Disp: 90 tablet, Rfl: 2   sertraline (ZOLOFT) 50 MG tablet, TAKE 1 TABLET  BY MOUTH DAILY, Disp: 90 tablet, Rfl: 1  EXAM: BP Readings from Last 3 Encounters:  11/28/22 134/76  11/06/22 118/74  10/11/22 118/60    VITALS per patient if applicable:  GENERAL: alert, oriented, appears well and in no acute distress mild mod congestion nontoxic  HEENT: atraumatic, conjunttiva clear, no obvious abnormalities on inspection of external nose and ears  NECK: normal movements of the head and neck  LUNGS: on inspection no signs of respiratory distress, breathing rate appears normal, no obvious gross SOB, gasping or wheezing  CV: no obvious cyanosis PSYCH/NEURO: pleasant and cooperative, no obvious depression or anxiety, speech and thought processing grossly intact Lab Results  Component Value Date   WBC 4.6 09/26/2022   HGB 12.6 09/26/2022   HCT 38.6 09/26/2022   PLT 180 09/26/2022   GLUCOSE 103 (H) 11/06/2022   CHOL 168 03/06/2022   TRIG 52 03/06/2022   HDL 98 03/06/2022   LDLCALC 59 03/06/2022   ALT 20 09/26/2022   AST 23 09/26/2022   NA 131 (L) 11/06/2022   K 4.8 11/06/2022   CL 93 (L) 11/06/2022   CREATININE 0.92 11/06/2022   BUN 19 11/06/2022   CO2 23 11/06/2022   TSH 4.86 01/02/2022   INR 1.1  07/07/2022   HGBA1C 5.8 (H) 01/30/2015    ASSESSMENT AND PLAN:  Discussed the following assessment and plan:    ICD-10-CM   1. Respiratory tract infection due to COVID-19 virus  U07.1    J98.8    vaccinated no hx of prev infection has risk  on anticoagulation    2. Chronic anticoagulation  Z79.01       Risk benefit of medication discussed.   GFR above 60 theoretical IA with eliquis option to dec to 2.5 bid while on med ( for a fib)  Counseled.  Expectant management.  Risk of rebound alarm symptoms seek care.  Expectant management and discussion of plan and treatment with opportunity to ask questions and all were answered. The patient agreed with the plan and demonstrated an understanding of the instructions.   Advised to call back or seek an in-person evaluation if worsening  or having  further concerns  in interim. Return if symptoms worsen or fail to improve as expected.  Shanon Ace, MD

## 2022-11-28 NOTE — Telephone Encounter (Signed)
Notified pt w/ MD response. Pt states she took another test now she has covid. She does have a virtual appt w/ another provider. She would like for me to send what MD recommended for future reference.[name] KitchenJohny Chess

## 2022-11-29 MED ORDER — MOLNUPIRAVIR EUA 200MG CAPSULE: 4.0000 | ORAL_CAPSULE | Freq: Two times a day (BID) | ORAL | 0 refills | Status: AC

## 2022-11-29 NOTE — Telephone Encounter (Signed)
I did not realize  she was on flecainide . And interesting that the EHR module did not pick   up of have any warnings  when prescribing .    OK to change to molnupiravir . if patient wishes but   data are not compelling that it does any help for the patient.   Will give patient the option to  take molnupiravir  as opposed to no antivirals

## 2022-12-03 ENCOUNTER — Telehealth: Payer: Self-pay | Admitting: Internal Medicine

## 2022-12-03 NOTE — Telephone Encounter (Signed)
Patient is positive for covid and taking medication.  She has a terrible cough and would like something called in for the cough.  Please send to [facility] on [address]  Patient's number:  [phone]

## 2022-12-03 NOTE — Progress Notes (Unsigned)
Virtual Visit via Video Note  I connected with Amanda Wilkins on 12/03/22 at  3:40 PM EST by a video enabled telemedicine application and verified that I am speaking with the correct person using two identifiers.   I discussed the limitations of evaluation and management by telemedicine and the availability of in person appointments. The patient expressed understanding and agreed to proceed.  Present for the visit:  Myself, Dr Billey Gosling, [name].  The patient is currently at home and I am in the office.    No referring provider.    History of Present Illness: This is an acute visit for rash.  She was diagnosed with COVID last week.  She did do a virtual visit and was started on molnupiravir.  Her symptoms have improved some, but she still has symptoms.  She finished the antiviral yesterday.  Last night she started experiencing itching on her chest and this morning she noticed a blotchy red rash.  She is unsure what it is from.  She did not take anything for it and she was not sure what to take.  The rash is only in the chest-nowhere else.     Review of Systems  Constitutional:  Positive for malaise/fatigue. Negative for chills and fever.  Respiratory:  Positive for cough. Negative for shortness of breath and wheezing.   Skin:  Positive for rash.     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)  Tobacco Use   Smoking status: Never   Smokeless tobacco: Never  Vaping Use   Vaping Use: Never used  Substance and Sexual Activity   Alcohol use: Not Currently    Comment: Occasional   Drug use: Never   Sexual activity: Yes    Partners: Male    Birth control/protection: Post-menopausal    Comment:  intercourse age 13, sexual partners more than 5  Other Topics Concern   Not on file  Social History Narrative   Not on file   Social Determinants of Health   Financial Resource Strain: Low Risk  (07/13/2022)   Overall Financial Resource Strain (CARDIA)    Difficulty of Paying Living Expenses: Not hard at all  Food Insecurity: No Food Insecurity (07/13/2022)   Hunger Vital Sign    Worried About Running Out of Food in the Last Year: Never true    [place] in the Last Year: Never true  Transportation Needs: No Transportation Needs (07/13/2022)   PRAPARE - Hydrologist (Medical): No    Lack of Transportation (Non-Medical): No  Physical Activity: Insufficiently Active (07/13/2022)   Exercise Vital Sign    Days of Exercise per Week: 3 days    Minutes of Exercise per Session: 30 min  Stress: No Stress Concern Present (07/13/2022)   [place]    Feeling of Stress : Not at all  Social Connections: Moderately Isolated (07/13/2022)   Social Connection and Isolation Panel [NHANES]    Frequency of Communication with Friends and Family: More than three times a week    Frequency of Social Gatherings with Friends and Family: More than three times a week  Attends Religious Services: Never    Active Member of Clubs or Organizations: No    Attends Archivist Meetings: Never    Marital Status: Married     Observations/Objective: Appears well in NAD Breathing normally, coughing intermittently Blotchy, red rash on visible chest  Assessment and Plan:  Rash: Acute Started last night Red, itchy-located on chest only ?  Related to COVID versus other Not able to take Benadryl and would prefer not to take steroids Start Claritin daily which she does tolerate She does have a steroid cream at home and she can start That twice daily Call if no improvement   Follow Up Instructions:     I discussed the assessment and treatment plan with the patient. The patient was provided an opportunity to ask questions and all were answered. The patient agreed with the plan and demonstrated an understanding of the instructions.   The patient was advised to call back or seek an in-person evaluation if the symptoms worsen or if the condition fails to improve as anticipated.    Binnie Rail, MD

## 2022-12-04 ENCOUNTER — Ambulatory Visit: Payer: Medicare Other | Admitting: Sports Medicine

## 2022-12-04 ENCOUNTER — Encounter: Payer: Self-pay | Admitting: Internal Medicine

## 2022-12-04 ENCOUNTER — Telehealth (INDEPENDENT_AMBULATORY_CARE_PROVIDER_SITE_OTHER): Payer: Medicare Other | Admitting: Internal Medicine

## 2022-12-04 DIAGNOSIS — R21 Rash and other nonspecific skin eruption: Secondary | ICD-10-CM | POA: Diagnosis not present

## 2022-12-04 MED ORDER — HYDROCODONE BIT-HOMATROP MBR 5-1.5 MG/5ML PO SOLN: 5.0000 mL | Freq: Four times a day (QID) | ORAL | 0 refills | Status: AC | PRN

## 2022-12-04 NOTE — Telephone Encounter (Signed)
Notified pt w/ MD response../l,mb

## 2022-12-04 NOTE — Telephone Encounter (Signed)
Okay [place].  Office visit if problems.

## 2022-12-10 NOTE — Progress Notes (Unsigned)
Virtual Visit via Video Note   This visit type was conducted due to national recommendations for restrictions regarding the COVID-19 Pandemic (e.g. social distancing) in an effort to limit this patient's exposure and mitigate transmission in our community.  Due to her co-morbid illnesses, this patient is at least at moderate risk for complications without adequate follow up.  This format is felt to be most appropriate for this patient at this time.  All issues noted in this document were discussed and addressed.  A limited physical exam was performed with this format.  Please refer to the patient's chart for her consent to telehealth for [facility].   Evaluation Performed:  Follow-up visit  Date:  12/11/2022   ID:  Amanda Wilkins, DOB [birth year], MRN [id number]  Patient Location:  Home  Provider location:   [place]  PCP:  Wilkins, Amanda Lacks, MD  Cardiologist:  [name], MD  Sleep Medicine:  Fransico Him, MD Electrophysiologist:  None   Chief Complaint:  OSA  History of Present Illness:    Amanda Wilkins is a 77 y.o. female who presents via audio/video conferencing for a telehealth visit today.    Amanda Wilkins is a 77y.o. female with a hx of paroxysmal atrial fibrillation and flutter, hypertension and sleep apnea. Due to ongoing hypertension, snoring atrial fibrillation sleep study was ordered showing moderate obstructive sleep apnea with an AHI of 23.4/h and oxygen desaturations as low as 84%.  She subsequently underwent CPAP titration to 12 cm H2O.    She is doing well with her PAP device and thinks that she has gotten used to it.  She tolerates the mask and feels the pressure is adequate.  Since going on PAP she feels rested in the am and has no significant daytime sleepiness.  She denies any significant mouth or nasal dryness or nasal congestion.  She does not think that she snores.    Prior CV studies:   The following studies were reviewed  today:  PAP compliance download  Past Medical History:  Diagnosis Date   Acute bronchitis 09/11/2016   11/17 refractory   Acute cystitis without hematuria 05/17/2015   Acute kidney injury [facility])    Labs today   Acute right ankle pain 09/09/2018   Anticoagulant long-term use    Xarelto   Axillary pain    Bronchitis    Cellulitis    Cholecystitis    Chronic interstitial nephritis    CONJUNCTIVITIS, ACUTE 10/18/2010   Qualifier: Diagnosis of  By: Plotnikov MD, Amanda Lacks    D-dimer, elevated    Depression    Eye inflammation    GERD (gastroesophageal reflux disease)    History of interstitial nephritis 2016   chronic   History of kidney stones    has a small one found on xray   History of nuclear stress test 12/16/2014   Intermediate risk nuclear study w/ medium size moderate severity reversible defect in the basal and mid inferolateral and inferior wall (per dr cardiology note , dr [name] did not think this was consistent with ischemia)/  normal LV function and wall motion, ef 75%   History of septic shock 01/22/2015   in setting Group A Strep Cellulits erysipelas/chest wall induration with streptoccocus basterium-- Severe sepsis, DIC, Acute respiratory failure with pulmonary edema, Acute Kidney failure with chronic interstitial nephritis   Hypertension    Hyponatremia    Migraines    on Zoloft for migraines   Mild carotid artery disease ([place])  per duplex 08-30-2017 bilateral ICA 1-39%   OA (osteoarthritis) rheumotologist-  dr [name]   both knees,  shoulders, ankles   OSA on CPAP     moderate obstructive sleep apnea with an AHI of 23.4/h and oxygen desaturations as low as 84%.  Now on CPAP at 12 cm H2O.   PAF (paroxysmal atrial fibrillation) [facility]) 2009   cardiologist-- dr [name]   Paroxysmal atrial flutter [facility])    a. dx 11/2017.   PSVT (paroxysmal supraventricular tachycardia)    RA (rheumatoid arthritis) ([place])    Wears contact lenses    Past Surgical  History:  Procedure Laterality Date   BREAST BIOPSY Right 05/15/2017   [place]   CARDIOVERSION N/A 08/28/2021   Procedure: CARDIOVERSION;  Surgeon: Fay Records, MD;  Location: [facility] ENDOSCOPY;  Service: Cardiovascular;  Laterality: N/A;   [place] N/A 11/16/2021   Procedure: LAPAROSCOPIC CHOLECYSTECTOMY;  Surgeon: Ralene Ok, MD;  Location: [place];  Service: General;  Laterality: N/A;   ESOPHAGOGASTRODUODENOSCOPY N/A 02/08/2015   Procedure: ESOPHAGOGASTRODUODENOSCOPY (EGD);  Surgeon: [name], MD;  Location: [place];  Service: Endoscopy;  Laterality: N/A;   KNEE ARTHROSCOPY W/ MENISCAL REPAIR Left 08/2014    '@[facility]'$    SHOULDER SURGERY Right 04/04/2016   TOTAL HIP ARTHROPLASTY Right 06/19/2022   Procedure: RIGHT TOTAL HIP ARTHROPLASTY ANTERIOR APPROACH;  Surgeon: Mcarthur Rossetti, MD;  Location: [place];  Service: Orthopedics;  Laterality: Right;   TOTAL KNEE ARTHROPLASTY Right 09/01/2018   Procedure: RIGHT TOTAL KNEE ARTHROPLASTY;  Surgeon: [name], MD;  Location: [facility];  Service: Orthopedics;  Laterality: Right;     No outpatient medications have been marked as taking for the 12/11/22 encounter (Video Visit) with Sueanne Margarita, MD.     Allergies:   Epinephrine base, Aspirin, Benadryl [diphenhydramine], Covid-19 ad26 vaccine(janssen), Fish allergy, Hylan g-f 20, Penicillins, Tape, and Wound dressing adhesive   Social History   Tobacco Use   Smoking status: Never   Smokeless tobacco: Never  Vaping Use   Vaping Use: Never used  Substance Use Topics   Alcohol use: Not Currently    Comment: Occasional   Drug use: Never     Family Hx: The patient's family history includes Allergic rhinitis in her sister; Breast cancer (age of onset: 49) in her sister; Lymphoma in her mother; Pancreatic cancer in her brother; Prostate cancer in her father; Urticaria in her sister. There is no history of Asthma, Eczema, Immunodeficiency, Atopy, or  Angioedema.  ROS:   Please see the history of present illness.     All other systems reviewed and are negative.   Labs/Other Tests and Data Reviewed:    Recent Labs: 01/02/2022: TSH 4.86 09/26/2022: ALT 20; Hemoglobin 12.6; Platelets 180 11/06/2022: BUN 19; Creatinine, Ser 0.92; NT-Pro BNP 400; Potassium 4.8; Sodium 131   Recent Lipid Panel Lab Results  Component Value Date/Time   CHOL 168 03/06/2022 11:43 AM   TRIG 52 03/06/2022 11:43 AM   HDL 98 03/06/2022 11:43 AM   CHOLHDL 1.7 03/06/2022 11:43 AM   CHOLHDL 2 05/28/2018 08:37 AM   LDLCALC 59 03/06/2022 11:43 AM    Wt Readings from Last 3 Encounters:  11/28/22 233 lb (105.7 kg)  11/06/22 233 lb 3.2 oz (105.8 kg)  10/11/22 235 lb 3.2 oz (106.7 kg)     Objective:    Well nourished, well developed female in no acute distress. Well appearing, alert and conversant, regular work of breathing,  good skin color  Eyes- anicteric mouth- oral mucosa is pink  neuro- grossly intact skin- no apparent rash or lesions or cyanosis ASSESSMENT & PLAN:    1.  OSA - The patient is tolerating PAP therapy well without any problems. The PAP download performed by his DME was personally reviewed and interpreted by me today and showed an AHI of 1.4/hr on 12 cm H2O with 77% compliance in using more than 4 hours nightly.  The patient has been using and benefiting from PAP use and will continue to benefit from therapy.   2.  Hypertension  -BP is controlled on exam today -Continue prescription drug management with amlodipine 2.5 mg daily, Lopressor 25 mg 3 times daily with as needed refills  Time:   Today, I have spent 15 minutes on telemedicine discussing medical problems including OSA, HTN and reviewing patient's chart including PAP compliance download.  Medication Adjustments/Labs and Tests Ordered: Current medicines are reviewed at length with the patient today.  Concerns regarding medicines are outlined above.  Tests Ordered: No orders of  the defined types were placed in this encounter.   Medication Changes: No orders of the defined types were placed in this encounter.    Disposition:  Follow up in 1 year(s)  Signed, Fransico Him, MD  12/11/2022 9:57 AM    [facility]

## 2022-12-11 ENCOUNTER — Ambulatory Visit: Payer: Medicare Other | Attending: Cardiology | Admitting: Cardiology

## 2022-12-11 ENCOUNTER — Encounter: Payer: Self-pay | Admitting: Internal Medicine

## 2022-12-11 ENCOUNTER — Ambulatory Visit (INDEPENDENT_AMBULATORY_CARE_PROVIDER_SITE_OTHER): Payer: Medicare Other | Admitting: Internal Medicine

## 2022-12-11 VITALS — BP 122/74 | HR 65 | Temp 98.0°F | Ht 65.0 in | Wt 236.0 lb

## 2022-12-11 DIAGNOSIS — G4733 Obstructive sleep apnea (adult) (pediatric): Secondary | ICD-10-CM | POA: Insufficient documentation

## 2022-12-11 DIAGNOSIS — I10 Essential (primary) hypertension: Secondary | ICD-10-CM | POA: Insufficient documentation

## 2022-12-11 DIAGNOSIS — R197 Diarrhea, unspecified: Secondary | ICD-10-CM

## 2022-12-11 DIAGNOSIS — R112 Nausea with vomiting, unspecified: Secondary | ICD-10-CM

## 2022-12-11 DIAGNOSIS — J399 Disease of upper respiratory tract, unspecified: Secondary | ICD-10-CM

## 2022-12-11 DIAGNOSIS — M5136 Other intervertebral disc degeneration, lumbar region: Secondary | ICD-10-CM | POA: Insufficient documentation

## 2022-12-11 MED ORDER — PROMETHAZINE-DM 6.25-15 MG/5ML PO SYRP: 5.0000 mL | ORAL_SOLUTION | Freq: Four times a day (QID) | ORAL | 1 refills | Status: DC | PRN

## 2022-12-11 MED ORDER — FAMOTIDINE 20 MG PO TABS: 20.0000 mg | ORAL_TABLET | Freq: Every day | ORAL | 3 refills | Status: DC

## 2022-12-11 MED ORDER — AZITHROMYCIN 250 MG PO TABS: ORAL_TABLET | ORAL | 0 refills | Status: DC

## 2022-12-11 NOTE — Assessment & Plan Note (Signed)
C/o episodic spells after eating or drinking something in the morning: starts vomiting and having diarrhea and feeling faint x 15-20 min long; it  happens randomly has had it 4 times since last april) - bile.  S/p cholecystectomy Empiric Pepcid qhs 20 mg

## 2022-12-11 NOTE — Assessment & Plan Note (Signed)
Post-COVID bronchitis Zpac Prom DM syr

## 2022-12-11 NOTE — Patient Instructions (Signed)
Medication Instructions:  Your physician recommends that you continue on your current medications as directed. Please refer to the Current Medication list given to you today.  *If you need a refill on your cardiac medications before your next appointment, please call your pharmacy*  Follow-Up: At [facility], you and your health needs are our priority.  As part of our continuing mission to provide you with exceptional heart care, we have created designated Provider Care Teams.  These Care Teams include your primary Cardiologist (physician) and Advanced Practice Providers (APPs -  Physician Assistants and Nurse Practitioners) who all work together to provide you with the care you need, when you need it.  Your next appointment:   1 year(s)  Provider:   Dr. Radford Pax

## 2022-12-11 NOTE — Progress Notes (Signed)
Subjective:  Patient ID: Amanda Wilkins, female    DOB: 09-11-1946  Age: 77 y.o. MRN: [id number]  CC: Cough (Still lingering from testing positive for covid on the 14th , after eating or drinking coffee starts vomiting and having diarrhea and feeling faint happens randomly has had it 4 times since last april)   HPI Amanda Wilkins presents for cough (Still lingering from testing positive for covid on the 14th .  C/o episodic spells after eating or drinking something in the morning: starts vomiting and having diarrhea and feeling faint x 15-20 min long; it  happens randomly has had it 4 times since last april) - bile.   Outpatient Medications Prior to Visit  Medication Sig Dispense Refill   acetaminophen (TYLENOL) 500 MG tablet Take 1,000 mg by mouth daily as needed for moderate pain or mild pain (pain).     ALPRAZolam (XANAX) 0.25 MG tablet Take 1 tablet (0.25 mg total) by mouth 2 (two) times daily as needed for anxiety. 60 tablet 3   amLODipine (NORVASC) 2.5 MG tablet Take 1 tablet (2.5 mg total) by mouth in the morning and at bedtime. 180 tablet 3   apixaban (ELIQUIS) 5 MG TABS tablet TAKE 1 TABLET(5 MG) BY MOUTH TWICE DAILY 60 tablet 11   cholecalciferol (VITAMIN D3) 25 MCG (1000 UT) tablet Take 1,000 Units by mouth daily.     EPINEPHrine 0.3 mg/0.3 mL IJ SOAJ injection Inject 0.3 mg into the muscle as needed for anaphylaxis. As needed for life-threatening allergic reactions 2 each 1   flecainide (TAMBOCOR) 50 MG tablet Take 1.5 tablets (75 mg total) by mouth 2 (two) times daily. 270 tablet 3   HYDROcodone bit-homatropine (HYCODAN) 5-1.5 MG/5ML syrup Take 5 mLs by mouth every 6 (six) hours as needed for up to 10 days for cough. 240 mL 0   hydroxychloroquine (PLAQUENIL) 200 MG tablet Take 400 mg by mouth every morning.     metoprolol tartrate (LOPRESSOR) 25 MG tablet Take 1 tablet (25 mg total) by mouth 3 (three) times daily. CAN TAKE AN ADDITIONAL TAB AS NEEDED FOR PALPITATIONS 90  tablet 3   Probiotic Product (ALIGN) 4 MG CAPS Take 4 mg by mouth daily.     rosuvastatin (CRESTOR) 5 MG tablet TAKE 1 TABLET(5 MG) BY MOUTH DAILY 90 tablet 2   sertraline (ZOLOFT) 50 MG tablet TAKE 1 TABLET BY MOUTH DAILY 90 tablet 1   No facility-administered medications prior to visit.    ROS: Review of Systems  Constitutional:  Positive for fatigue. Negative for activity change, appetite change, chills and unexpected weight change.  HENT:  Positive for congestion. Negative for mouth sores and sinus pressure.   Eyes:  Negative for visual disturbance.  Respiratory:  Positive for cough. Negative for chest tightness.   Gastrointestinal:  Negative for abdominal pain and nausea.  Genitourinary:  Negative for difficulty urinating, frequency and vaginal pain.  Musculoskeletal:  Positive for arthralgias. Negative for back pain and gait problem.  Skin:  Negative for pallor and rash.  Neurological:  Negative for dizziness, tremors, weakness, numbness and headaches.  Psychiatric/Behavioral:  Negative for confusion and sleep disturbance.     Objective:  BP 122/74 (BP Location: Left Arm, Patient Position: Sitting, Cuff Size: Normal)   Pulse 65   Temp 98 F (36.7 C) (Oral)   Ht '5\' 5"'$  (1.651 m)   Wt 236 lb (107 kg)   SpO2 99%   BMI 39.27 kg/m   BP Readings from Last 3 Encounters:  12/11/22 122/74  11/28/22 134/76  11/06/22 118/74    Wt Readings from Last 3 Encounters:  12/11/22 236 lb (107 kg)  11/28/22 233 lb (105.7 kg)  11/06/22 233 lb 3.2 oz (105.8 kg)    Physical Exam Constitutional:      General: She is not in acute distress.    Appearance: She is well-developed. She is obese.  HENT:     Head: Normocephalic.     Right Ear: External ear normal.     Left Ear: External ear normal.     Nose: Nose normal.  Eyes:     General:        Right eye: No discharge.        Left eye: No discharge.     Conjunctiva/sclera: Conjunctivae normal.     Pupils: Pupils are equal, round,  and reactive to light.  Neck:     Thyroid: No thyromegaly.     Vascular: No JVD.     Trachea: No tracheal deviation.  Cardiovascular:     Rate and Rhythm: Normal rate and regular rhythm.     Heart sounds: Normal heart sounds.  Pulmonary:     Effort: No respiratory distress.     Breath sounds: No stridor. No wheezing.  Abdominal:     General: Bowel sounds are normal. There is no distension.     Palpations: Abdomen is soft. There is no mass.     Tenderness: There is no abdominal tenderness. There is no guarding or rebound.  Musculoskeletal:        General: No tenderness.     Cervical back: Normal range of motion and neck supple. No rigidity.  Lymphadenopathy:     Cervical: No cervical adenopathy.  Skin:    Findings: No erythema or rash.  Neurological:     Cranial Nerves: No cranial nerve deficit.     Motor: No abnormal muscle tone.     Coordination: Coordination normal.     Deep Tendon Reflexes: Reflexes normal.  Psychiatric:        Behavior: Behavior normal.        Thought Content: Thought content normal.        Judgment: Judgment normal.   Coarse BS  Lab Results  Component Value Date   WBC 4.6 09/26/2022   HGB 12.6 09/26/2022   HCT 38.6 09/26/2022   PLT 180 09/26/2022   GLUCOSE 103 (H) 11/06/2022   CHOL 168 03/06/2022   TRIG 52 03/06/2022   HDL 98 03/06/2022   LDLCALC 59 03/06/2022   ALT 20 09/26/2022   AST 23 09/26/2022   NA 131 (L) 11/06/2022   K 4.8 11/06/2022   CL 93 (L) 11/06/2022   CREATININE 0.92 11/06/2022   BUN 19 11/06/2022   CO2 23 11/06/2022   TSH 4.86 01/02/2022   INR 1.1 07/07/2022   HGBA1C 5.8 (H) 01/30/2015    MM 3D SCREEN BREAST BILATERAL  Result Date: 11/23/2022 CLINICAL DATA:  Screening. EXAM: DIGITAL SCREENING BILATERAL MAMMOGRAM WITH TOMOSYNTHESIS AND CAD TECHNIQUE: Bilateral screening digital craniocaudal and mediolateral oblique mammograms were obtained. Bilateral screening digital breast tomosynthesis was performed. The images were  evaluated with computer-aided detection. COMPARISON:  Previous exam(s). ACR Breast Density Category b: There are scattered areas of fibroglandular density. FINDINGS: There are no findings suspicious for malignancy. IMPRESSION: No mammographic evidence of malignancy. A result letter of this screening mammogram will be mailed directly to the patient. RECOMMENDATION: Screening mammogram in one year. (Code:SM-B-01Y) BI-RADS CATEGORY  1: Negative. Electronically Signed  By: Lillia Mountain M.D.   On: 11/23/2022 11:08    Assessment & Plan:   Problem List Items Addressed This Visit       Respiratory   Upper respiratory disease - Primary    Post-COVID bronchitis Zpac Prom DM syr        Digestive   Nausea vomiting and diarrhea    C/o episodic spells after eating or drinking something in the morning: starts vomiting and having diarrhea and feeling faint x 15-20 min long; it  happens randomly has had it 4 times since last april) - bile.  S/p cholecystectomy Empiric Pepcid qhs 20 mg           Meds ordered this encounter  Medications   promethazine-dextromethorphan (PROMETHAZINE-DM) 6.25-15 MG/5ML syrup    Sig: Take 5 mLs by mouth 4 (four) times daily as needed for cough.    Dispense:  240 mL    Refill:  1   azithromycin (ZITHROMAX Z-PAK) 250 MG tablet    Sig: As directed    Dispense:  6 tablet    Refill:  0   famotidine (PEPCID) 20 MG tablet    Sig: Take 1 tablet (20 mg total) by mouth at bedtime.    Dispense:  90 tablet    Refill:  3      Follow-up: No follow-ups on file.  [name], MD

## 2022-12-18 ENCOUNTER — Encounter: Payer: Self-pay | Admitting: Internal Medicine

## 2022-12-18 NOTE — Patient Instructions (Addendum)
        Medications changes include :   none    A referral was ordered for PT for [facility].      Return if symptoms worsen or fail to improve.

## 2022-12-18 NOTE — Progress Notes (Signed)
Subjective:    Patient ID: Amanda Wilkins, female    DOB: 12/05/1945, 77 y.o.   MRN: [id number]      HPI Amanda Wilkins is here for  Chief Complaint  Patient presents with   Medical Management of Chronic Issues    Referral for PT sent to [facility], coughing a lot more and feeling more short of breath    Positive covid on 2/14 Was seen by PCP 2/27 for URI - rx'd zapk, promethazine- DM cough syrup for post covid bronchitis.   The cough used to feel like it was left lower side and now it's in mid chest.  Cough comes and goes.  Sometimes dry, sometimes productive.    Taking cough syrup.   Slept 9 1/2 hrs last night and for the past week.  Not sure why she is sleeping so much   Yesterday did tai chi class at [facility].  Her right foot afterward kept rolling - inverting.  She talked to PT and they advised dong PT>   Medications and allergies reviewed with patient and updated if appropriate.  Current Outpatient Medications on File Prior to Visit  Medication Sig Dispense Refill   acetaminophen (TYLENOL) 500 MG tablet Take 1,000 mg by mouth daily as needed for moderate pain or mild pain (pain).     ALPRAZolam (XANAX) 0.25 MG tablet Take 1 tablet (0.25 mg total) by mouth 2 (two) times daily as needed for anxiety. 60 tablet 3   amLODipine (NORVASC) 2.5 MG tablet Take 1 tablet (2.5 mg total) by mouth in the morning and at bedtime. 180 tablet 3   apixaban (ELIQUIS) 5 MG TABS tablet TAKE 1 TABLET(5 MG) BY MOUTH TWICE DAILY 60 tablet 11   azithromycin (ZITHROMAX Z-PAK) 250 MG tablet As directed 6 tablet 0   cholecalciferol (VITAMIN D3) 25 MCG (1000 UT) tablet Take 1,000 Units by mouth daily.     EPINEPHrine 0.3 mg/0.3 mL IJ SOAJ injection Inject 0.3 mg into the muscle as needed for anaphylaxis. As needed for life-threatening allergic reactions 2 each 1   famotidine (PEPCID) 20 MG tablet Take 1 tablet (20 mg total) by mouth at bedtime. 90 tablet 3   flecainide (TAMBOCOR) 50 MG tablet Take  1.5 tablets (75 mg total) by mouth 2 (two) times daily. 270 tablet 3   hydroxychloroquine (PLAQUENIL) 200 MG tablet Take 400 mg by mouth every morning.     metoprolol tartrate (LOPRESSOR) 25 MG tablet Take 1 tablet (25 mg total) by mouth 3 (three) times daily. CAN TAKE AN ADDITIONAL TAB AS NEEDED FOR PALPITATIONS 90 tablet 3   Probiotic Product (ALIGN) 4 MG CAPS Take 4 mg by mouth daily.     promethazine-dextromethorphan (PROMETHAZINE-DM) 6.25-15 MG/5ML syrup Take 5 mLs by mouth 4 (four) times daily as needed for cough. 240 mL 1   rosuvastatin (CRESTOR) 5 MG tablet TAKE 1 TABLET(5 MG) BY MOUTH DAILY 90 tablet 2   sertraline (ZOLOFT) 50 MG tablet TAKE 1 TABLET BY MOUTH DAILY 90 tablet 1   No current facility-administered medications on file prior to visit.    Review of Systems  Constitutional:  Negative for fever.  HENT:  Positive for postnasal drip.   Respiratory:  Positive for cough and shortness of breath (noticed it today). Negative for chest tightness and wheezing.   Cardiovascular:  Positive for leg swelling (mild). Negative for chest pain and palpitations.       Objective:   Vitals:   12/19/22 1426  BP: 124/80  Pulse:  65  Temp: 98.2 F (36.8 C)  SpO2: 99%   BP Readings from Last 3 Encounters:  12/19/22 124/80  12/11/22 122/74  11/28/22 134/76   Wt Readings from Last 3 Encounters:  12/19/22 240 lb (108.9 kg)  12/11/22 236 lb (107 kg)  11/28/22 233 lb (105.7 kg)   Body mass index is 39.94 kg/m.    Physical Exam Constitutional:      General: She is not in acute distress.    Appearance: Normal appearance.  HENT:     Head: Normocephalic and atraumatic.  Eyes:     Conjunctiva/sclera: Conjunctivae normal.  Cardiovascular:     Rate and Rhythm: Normal rate and regular rhythm.     Heart sounds: Normal heart sounds.  Pulmonary:     Effort: Pulmonary effort is normal. No respiratory distress.     Breath sounds: Normal breath sounds. No wheezing or rales.   Musculoskeletal:     Cervical back: Neck supple.     Right lower leg: No edema.     Left lower leg: No edema.  Lymphadenopathy:     Cervical: No cervical adenopathy.  Skin:    General: Skin is warm and dry.     Findings: No rash.  Neurological:     Mental Status: She is alert.            Assessment & Plan:    Post covid cough:  Subacute Dx with covid 2/14  Took zpak last week - ? Bronchitis Still with cough - likely post covid cough No need for an antibiotic Lungs clear, O2 99% Discussed brief course of steroids versus inhaler-deferred both The cough will slowly improve Continue cough syrup as needed  Right ankle weakness: New Has noticed some weakness in the ankle and when she was walking yesterday and kept inverting.  That is affecting her walking Would like to do some physical therapy which I think is reasonable Referral ordered for [facility]  Hypertension: Chronic Blood pressure is well-controlled Continue amlodipine 2.5 mg twice daily, metoprolol 25 mg 3 times daily

## 2022-12-19 ENCOUNTER — Encounter: Payer: Self-pay | Admitting: Internal Medicine

## 2022-12-19 ENCOUNTER — Ambulatory Visit (INDEPENDENT_AMBULATORY_CARE_PROVIDER_SITE_OTHER): Payer: Medicare Other | Admitting: Internal Medicine

## 2022-12-19 VITALS — BP 124/80 | HR 65 | Temp 98.2°F | Ht 65.0 in | Wt 240.0 lb

## 2022-12-19 DIAGNOSIS — U099 Post covid-19 condition, unspecified: Secondary | ICD-10-CM

## 2022-12-19 DIAGNOSIS — R053 Chronic cough: Secondary | ICD-10-CM

## 2022-12-19 DIAGNOSIS — R29898 Other symptoms and signs involving the musculoskeletal system: Secondary | ICD-10-CM

## 2022-12-20 ENCOUNTER — Encounter: Payer: Self-pay | Admitting: Radiology

## 2023-01-02 ENCOUNTER — Ambulatory Visit: Payer: Medicare Other | Admitting: Podiatry

## 2023-01-02 NOTE — Progress Notes (Unsigned)
Office Visit    Patient Name: Amanda Wilkins Date of Encounter: 01/03/2023  Primary Care Provider:  [name], MD Primary Cardiologist:  [name], MD Primary Electrophysiologist: None  Chief Complaint    Amanda Wilkins is a 77 y.o. female with PMH of paroxysmal AF/flutter (on Eliquis), HTN, obesity, chronic interstitial nephritis, OSA (on CPAP), PSVT, mild carotid disease (1-39%), HLD, aortic atherosclerosis, mild MR, right total hip arthroplasty who presents today for   Past Medical History    Past Medical History:  Diagnosis Date   Acute bronchitis 09/11/2016   11/17 refractory   Acute cystitis without hematuria 05/17/2015   Acute kidney injury [facility])    Labs today   Acute right ankle pain 09/09/2018   Anticoagulant long-term use    Xarelto   Axillary pain    Bronchitis    Cellulitis    Cholecystitis    Chronic interstitial nephritis    CONJUNCTIVITIS, ACUTE 10/18/2010   Qualifier: Diagnosis of  By: Plotnikov MD, Evie Lacks    D-dimer, elevated    Depression    Eye inflammation    GERD (gastroesophageal reflux disease)    History of interstitial nephritis 2016   chronic   History of kidney stones    has a small one found on xray   History of nuclear stress test 12/16/2014   Intermediate risk nuclear study w/ medium size moderate severity reversible defect in the basal and mid inferolateral and inferior wall (per dr cardiology note , dr [name] did not think this was consistent with ischemia)/  normal LV function and wall motion, ef 75%   History of septic shock 01/22/2015   in setting Group A Strep Cellulits erysipelas/chest wall induration with streptoccocus basterium-- Severe sepsis, DIC, Acute respiratory failure with pulmonary edema, Acute Kidney failure with chronic interstitial nephritis   Hypertension    Hyponatremia    Migraines    on Zoloft for migraines   Mild carotid artery disease ([place])    per duplex 08-30-2017 bilateral ICA  1-39%   OA (osteoarthritis) rheumotologist-  dr [name]   both knees,  shoulders, ankles   OSA on CPAP     moderate obstructive sleep apnea with an AHI of 23.4/h and oxygen desaturations as low as 84%.  Now on CPAP at 12 cm H2O.   PAF (paroxysmal atrial fibrillation) [facility]) 2009   cardiologist-- dr [name]   Paroxysmal atrial flutter [facility])    a. dx 11/2017.   PSVT (paroxysmal supraventricular tachycardia)    RA (rheumatoid arthritis) ([place])    Wears contact lenses    Past Surgical History:  Procedure Laterality Date   BREAST BIOPSY Right 05/15/2017   [place]   CARDIOVERSION N/A 08/28/2021   Procedure: CARDIOVERSION;  Surgeon: Fay Records, MD;  Location: [facility] ENDOSCOPY;  Service: Cardiovascular;  Laterality: N/A;   [place] N/A 11/16/2021   Procedure: LAPAROSCOPIC CHOLECYSTECTOMY;  Surgeon: Ralene Ok, MD;  Location: [place];  Service: General;  Laterality: N/A;   ESOPHAGOGASTRODUODENOSCOPY N/A 02/08/2015   Procedure: ESOPHAGOGASTRODUODENOSCOPY (EGD);  Surgeon: [name], MD;  Location: [place];  Service: Endoscopy;  Laterality: N/A;   KNEE ARTHROSCOPY W/ MENISCAL REPAIR Left 08/2014    @[facility]    SHOULDER SURGERY Right 04/04/2016   TOTAL HIP ARTHROPLASTY Right 06/19/2022   Procedure: RIGHT TOTAL HIP ARTHROPLASTY ANTERIOR APPROACH;  Surgeon: Mcarthur Rossetti, MD;  Location: [place];  Service: Orthopedics;  Laterality: Right;   TOTAL KNEE  ARTHROPLASTY Right 09/01/2018   Procedure: RIGHT TOTAL KNEE ARTHROPLASTY;  Surgeon: [name], MD;  Location: [facility];  Service: Orthopedics;  Laterality: Right;    Allergies  Allergies  Allergen Reactions   Epinephrine Base Other (See Comments)    Seriously increases heart rate   Aspirin Other (See Comments)    Can take the coated 325 mg. Plain asa 325 mg speeds up the heart.   Benadryl [Diphenhydramine]     Increased heart rate   Covid-19 Ad26 Vaccine(Janssen) Hives     hives 6 hrs after  her COVID 19 2nd booster - resolved    Fish Allergy Hives    Was imitation crab meat and broke out in hives, had skin testing for lobster and showed allergic   Hylan G-F 20 Other (See Comments)    Very painful (knee injection)   Penicillins Other (See Comments)    Pt previously has been told not to take because of family history of reactions (water blisters). She took amoxicillin in 2012-2013 with no reaction Pt received ancef on 11-16-2021 without issue   Tape Other (See Comments)    Blisters from adhesive tape  Blisters from adhesive tape, Other reaction(s): Other (See Comments), Blisters from adhesive tape   Wound Dressing Adhesive Other (See Comments)    Blisters from adhesive tape    History of Present Illness    Amanda Wilkins  is a 77 year old female with the above mention past medical history who presents today for complaint of shortness of breath with ambulation.  She was initially seen by Dr. [name] in 2014 after being followed by Dr. Verl Blalock for management of atrial fibrillation.  She had 2D echo completed 01/2015 that was normal with a EF of 50-60% and no RWMA or valvular abnormalities.  Myoview was also completed 12/2014 with moderate defect at base mid inferior lateral and inferior wall consistent with ischemia.  She wore a Holter monitor in January 2018 that showed A-fib and SVT.  She was recommended to start flecainide 50 mg twice daily however patient declined due to possible side effects.  She was noted to have mild plaque by carotid Doppler bilaterally 08/2017.  She was advised to titrate atenolol to 25 mg twice daily.  In February 2019 she presented with new onset atrial flutter and spontaneously converted to sinus rhythm.  She has sleep study obtain 01/2018 that indicated obstructive sleep apnea and was referred to Dr. Radford Pax for management.  She was started on CPAP with titration noted.  She underwent cardiac CT 06/2019 that showed calcium score 0.  She was seen in the office by  Robbie Lis, PA on 11/2019 with complaint of dyspnea on exertion while riding her bike.  BNP was completed that was elevated at 433 and patient was recommended to use Lasix 3 times per week.  Repeat 2D echo was also completed that showed normal EF and mild MR with RVSP estimated at 47 mmHg.  She continues to complain of shortness of breath with exertion and chest x-ray was completed that showed no abnormalities.  She was seen by Dr. [name] 04/2021 for tachypalpitations that converted on their own and had complaint of dizziness with atenolol and was decreased to 25 mg twice daily.  She was admitted on 08/20/2021 for evaluation of atrial fibrillation.  She had increase atenolol to 50 mg twice daily with plan to start Multaq however was not affordable at that time.  She declined pharmacological Myoview at that time.  She was started  on flecainide with plan to undergo outpatient DCCV.  She underwent procedure 08/2021 with conversion to sinus rhythm however reverted back to A-fib before discharge.  Flecainide was increased to 75 mg twice daily.  She was last seen by Dr. [name] on 07/2022 for follow-up and was doing well 1 episode of A-fib lasting 4 hours.  She was currently in PT for hip replacement.  She was continuing to maintain sinus rhythm blood pressure was slightly elevated and plan to continue to follow at that time.  She was euvolemic from a volume standpoint.  She was seen by me on 08/23/2022 for complaint of dyspnea on exertion.  During her visit she was in sinus rhythm and denies any palpitations.  She was instructed to take extra 25 mg of metoprolol for breakthrough palpitations.  No other med changes were made at that time.  She was seen by Dr. [name] 1 week later and EKG showed some ectopy but not clear AF.  Metoprolol was increased to 25 mg every 6 hours and amlodipine was discontinued.  She was euvolemic on examination.  She contacted our office and noted her blood pressure increasing to 220/109 with oxygen sat of  85.  Dr. [name] was notified of complaint and CPX stress test was ordered.  She contacted office on 11/29 with complaint of decreased sats and shortness of breath.  Chest CT was ordered to rule out PE.  CT scan was completed with no evidence of pulmonary embolism or thoracic aortic dissection.  She was seen in the ED on 12/13 for complaint of severe headache and elevated blood pressures in the [id number] systolically.  She reported elevated blood pressures this morning and was advised to follow-up with cardiology.  Patient was seen on 09/27/2022 for follow-up.  During visit patient had blood pressure fluctuations and pressure was elevated with no palpitations noted.  Amlodipine was increased to 2.5 mg twice daily.  She was seen in follow-up 11/06/2022 by Dr. [name].  During visit patient's blood pressure was controlled current amlodipine dose.  She was maintaining sinus rhythm was encouraged to increase exercise program to help her feel better.  Amanda Wilkins presents today for follow-up and complaint of syncope.  She reports that last month she experienced an episode of nausea and vomiting with diarrhea which caused her to faint momentarily.  She denies any palpitations or other cardiac complaints during this episode.  Her blood pressure today is well-controlled at 118/74 and heart rate is 63 bpm.  She was recently seen by Dr. Alain Marion and was started on famotidine.  She reports that these episodes have occurred over the past few years after she had her gallbladder removed.  She is euvolemic on exam today and reports that she is eating a fairly healthy diet.  She will be traveling to go out of the country in the next few months.  Patient denies chest pain, palpitations, dyspnea, PND, orthopnea, nausea, vomiting, dizziness, syncope, edema, weight gain, or early satiety.  Home Medications    Current Outpatient Medications  Medication Sig Dispense Refill   acetaminophen (TYLENOL) 500 MG tablet Take 1,000 mg by mouth  daily as needed for moderate pain or mild pain (pain).     ALPRAZolam (XANAX) 0.25 MG tablet Take 1 tablet (0.25 mg total) by mouth 2 (two) times daily as needed for anxiety. 60 tablet 3   apixaban (ELIQUIS) 5 MG TABS tablet TAKE 1 TABLET(5 MG) BY MOUTH TWICE DAILY 60 tablet 11   cholecalciferol (VITAMIN D3) 25 MCG (  1000 UT) tablet Take 1,000 Units by mouth daily.     EPINEPHrine 0.3 mg/0.3 mL IJ SOAJ injection Inject 0.3 mg into the muscle as needed for anaphylaxis. As needed for life-threatening allergic reactions 2 each 1   famotidine (PEPCID) 20 MG tablet Take 1 tablet (20 mg total) by mouth at bedtime. 90 tablet 3   flecainide (TAMBOCOR) 50 MG tablet Take 1.5 tablets (75 mg total) by mouth 2 (two) times daily. 270 tablet 3   hydroxychloroquine (PLAQUENIL) 200 MG tablet Take 400 mg by mouth every morning.     metoprolol tartrate (LOPRESSOR) 25 MG tablet Take 1 tablet (25 mg total) by mouth 3 (three) times daily. CAN TAKE AN ADDITIONAL TAB AS NEEDED FOR PALPITATIONS 90 tablet 3   Probiotic Product (ALIGN) 4 MG CAPS Take 4 mg by mouth daily.     rosuvastatin (CRESTOR) 5 MG tablet TAKE 1 TABLET(5 MG) BY MOUTH DAILY 90 tablet 2   sertraline (ZOLOFT) 50 MG tablet TAKE 1 TABLET BY MOUTH DAILY 90 tablet 1   amLODipine (NORVASC) 2.5 MG tablet Take 1 tablet (2.5 mg total) by mouth in the morning and at bedtime. 180 tablet 3   promethazine-dextromethorphan (PROMETHAZINE-DM) 6.25-15 MG/5ML syrup Take 5 mLs by mouth 4 (four) times daily as needed for cough. 240 mL 1   No current facility-administered medications for this visit.     Review of Systems  Please see the history of present illness.    (+) Nausea (+) Shortness of breath with heavy exertion  All other systems reviewed and are otherwise negative except as noted above.  Physical Exam    Wt Readings from Last 3 Encounters:  01/03/23 242 lb (109.8 kg)  12/19/22 240 lb (108.9 kg)  12/11/22 236 lb (107 kg)   VS: Vitals:   01/03/23 1529   BP: 118/74  Pulse: 63  SpO2: 99%  ,Body mass index is 40.27 kg/m.  Constitutional:      Appearance: Healthy appearance. Not in distress.  Neck:     Vascular: JVD normal.  Pulmonary:     Effort: Pulmonary effort is normal.     Breath sounds: No wheezing. No rales. Diminished in the bases Cardiovascular:     Normal rate. Regular rhythm. Normal S1. Normal S2.      Murmurs: There is no murmur.  Edema:    Peripheral edema absent.  Abdominal:     Palpations: Abdomen is soft non tender. There is no hepatomegaly.  Skin:    General: Skin is warm and dry.  Neurological:     General: No focal deficit present.     Mental Status: Alert and oriented to person, place and time.     Cranial Nerves: Cranial nerves are intact.  EKG/LABS/ Recent Cardiac Studies    ECG personally reviewed by me today -none completed today  Risk Assessment/Calculations:    CHA2DS2-VASc Score = 6   This indicates a 9.7% annual risk of stroke. The patient's score is based upon: CHF History: 1 HTN History: 1 Diabetes History: 0 Stroke History: 0 Vascular Disease History: 1 Age Score: 2 Gender Score: 1           Lab Results  Component Value Date   WBC 4.6 09/26/2022   HGB 12.6 09/26/2022   HCT 38.6 09/26/2022   MCV 96.5 09/26/2022   PLT 180 09/26/2022   Lab Results  Component Value Date   CREATININE 0.92 11/06/2022   BUN 19 11/06/2022   NA 131 (L) 11/06/2022  K 4.8 11/06/2022   CL 93 (L) 11/06/2022   CO2 23 11/06/2022   Lab Results  Component Value Date   ALT 20 09/26/2022   AST 23 09/26/2022   ALKPHOS 80 09/26/2022   BILITOT 0.9 09/26/2022   Lab Results  Component Value Date   CHOL 168 03/06/2022   HDL 98 03/06/2022   LDLCALC 59 03/06/2022   TRIG 52 03/06/2022   CHOLHDL 1.7 03/06/2022    Lab Results  Component Value Date   HGBA1C 5.8 (H) 01/30/2015    Cardiac Studies & Procedures       ECHOCARDIOGRAM  ECHOCARDIOGRAM COMPLETE 10/30/2022  Narrative ECHOCARDIOGRAM  REPORT    Patient Name:   Amanda Wilkins Date of Exam: 10/30/2022 Medical Rec #:  [id number]           Height:       65.0 in Accession #:    [id number]          Weight:       235.2 lb Date of Birth:  05-Aug-1946           BSA:          2.119 m Patient Age:    10 years            BP:           118/60 mmHg Patient Gender: F                   HR:           73 bpm. Exam Location:  [place]  Procedure: 2D Echo, Cardiac Doppler and Color Doppler  Indications:    R06.02 SOB  History:        Patient has prior history of Echocardiogram examinations, most recent 12/09/2019. Arrythmias:PSVT, Signs/Symptoms:Shortness of Breath; Risk Factors:Hypertension, Former Smoker and Obesity.  Sonographer:    Coralyn Helling RDCS Referring Phys: [place], JR Bayler Gehrig  IMPRESSIONS   1. Left ventricular ejection fraction, by estimation, is 55 to 60%. The left ventricle has normal function. The left ventricle has no regional wall motion abnormalities. There is mild concentric left ventricular hypertrophy. Left ventricular diastolic parameters are consistent with Grade II diastolic dysfunction (pseudonormalization). 2. Right ventricular systolic function is normal. The right ventricular size is mildly enlarged. There is mildly elevated pulmonary artery systolic pressure. The estimated right ventricular systolic pressure is 123XX123 mmHg. 3. Left atrial size was moderately dilated. 4. Right atrial size was moderately dilated. 5. The mitral valve is normal in structure. Mild mitral valve regurgitation. No evidence of mitral stenosis. 6. Tricuspid valve regurgitation is mild to moderate. 7. The aortic valve is tricuspid. There is mild calcification of the aortic valve. Aortic valve regurgitation is not visualized. No aortic stenosis is present. 8. The inferior vena cava is normal in size with greater than 50% respiratory variability, suggesting right atrial pressure of 3 mmHg.  FINDINGS Left Ventricle:  Left ventricular ejection fraction, by estimation, is 55 to 60%. The left ventricle has normal function. The left ventricle has no regional wall motion abnormalities. The left ventricular internal cavity size was normal in size. There is mild concentric left ventricular hypertrophy. Left ventricular diastolic parameters are consistent with Grade II diastolic dysfunction (pseudonormalization).  Right Ventricle: The right ventricular size is mildly enlarged. No increase in right ventricular wall thickness. Right ventricular systolic function is normal. There is mildly elevated pulmonary artery systolic pressure. The tricuspid regurgitant velocity is 3.01 m/s, and with an assumed right atrial  pressure of 3 mmHg, the estimated right ventricular systolic pressure is 123XX123 mmHg.  Left Atrium: Left atrial size was moderately dilated.  Right Atrium: Right atrial size was moderately dilated.  Pericardium: There is no evidence of pericardial effusion.  Mitral Valve: The mitral valve is normal in structure. Mild mitral valve regurgitation. No evidence of mitral valve stenosis.  Tricuspid Valve: The tricuspid valve is normal in structure. Tricuspid valve regurgitation is mild to moderate.  Aortic Valve: The aortic valve is tricuspid. There is mild calcification of the aortic valve. Aortic valve regurgitation is not visualized. No aortic stenosis is present.  Pulmonic Valve: The pulmonic valve was normal in structure. Pulmonic valve regurgitation is not visualized.  Aorta: The aortic root is normal in size and structure.  Venous: The inferior vena cava is normal in size with greater than 50% respiratory variability, suggesting right atrial pressure of 3 mmHg.  IAS/Shunts: No atrial level shunt detected by color flow Doppler.   LEFT VENTRICLE PLAX 2D LVIDd:         4.30 cm   Diastology LVIDs:         3.00 cm   LV e' medial:    11.80 cm/s LV PW:         1.30 cm   LV E/e' medial:  10.9 LV IVS:         1.20 cm   LV e' lateral:   17.40 cm/s LVOT diam:     2.00 cm   LV E/e' lateral: 7.4 LV SV:         87 LV SV Index:   41 LVOT Area:     3.14 cm   RIGHT VENTRICLE             IVC RV S prime:     14.50 cm/s  IVC diam: 1.20 cm TAPSE (M-mode): 2.4 cm RVSP:           39.2 mmHg  LEFT ATRIUM             Index        RIGHT ATRIUM           Index LA diam:        4.90 cm 2.31 cm/m   RA Pressure: 3.00 mmHg LA Vol (A2C):   91.0 ml 42.94 ml/m  RA Area:     26.50 cm LA Vol (A4C):   75.5 ml 35.63 ml/m  RA Volume:   97.80 ml  46.15 ml/m LA Biplane Vol: 85.9 ml 40.53 ml/m AORTIC VALVE LVOT Vmax:   118.40 cm/s LVOT Vmean:  78.280 cm/s LVOT VTI:    0.276 m  AORTA Ao Root diam: 3.20 cm Ao Asc diam:  3.30 cm  MITRAL VALVE                TRICUSPID VALVE MV Area (PHT): 3.14 cm     TR Peak grad:   36.2 mmHg MV Decel Time: 241 msec     TR Vmax:        301.00 cm/s MV E velocity: 128.40 cm/s  Estimated RAP:  3.00 mmHg MV A velocity: 51.36 cm/s   RVSP:           39.2 mmHg MV E/A ratio:  2.50 SHUNTS Systemic VTI:  0.28 m Systemic Diam: 2.00 cm  Dalton McleanMD Electronically signed by Franki Monte Signature Date/Time: 10/30/2022/5:19:43 PM    Final    MONITORS  LONG TERM MONITOR (3-14 DAYS) 07/19/2021  Narrative Patch Wear Time:  8 days and 5 hours (2022-09-20T13:21:20-0400 to 2022-09-28T18:49:48-0400)  Predominant rhythm is sinus rhythm   Rates 51 to 104 bpm   Average HR 67 bpm   Frequent SVT, longest lasting 1 min 39 sec with average HR 156 bpm   Fastest HR 7 beats at 210 bpm    Rare PVCs Triggered events correlated with SR with PVC and also SVT   CT SCANS  CT CARDIAC SCORING (SELF PAY ONLY) 07/02/2019  Addendum 07/02/2019 11:55 AM ADDENDUM REPORT: 07/02/2019 11:52  ADDENDUM: OVER-READ INTERPRETATION  CT CHEST  The following report is an over-read performed by radiologist Dr.  Jasmine December: OVER-READ INTERPRETATION  CT CHEST  The following report is an over-read performed by  radiologist Dr. Norlene Duel [facility] on 07/02/2019. This over-read does not include interpretation of cardiac or coronary anatomy or pathology. The coronary calcium score interpretation by the cardiologist is attached.  COMPARISON:  01/23/2015  FINDINGS: Cardiovascular: Aortic atherosclerosis. Normal heart size. No pericardial effusion.  Mediastinum: No mass or adenopathy identified.  Lungs/pleura: No pleural effusion. No airspace consolidation, atelectasis or pneumothorax.  Upper abdomen: No acute abnormality within the visualized portions of the upper abdomen.  Musculoskeletal: No acute or suspicious osseous abnormality.  IMPRESSION: 1. No acute cardiopulmonary abnormalities. 2.  Aortic Atherosclerosis (ICD10-I70.0).   Electronically Signed By: Kerby Moors M.D. On: 07/02/2019 11:52  Narrative CLINICAL DATA:  Risk stratification  EXAM: Coronary Calcium Score  MEDICATIONS: None  TECHNIQUE: The patient was scanned on a [employer]. Axial non-contrast 3 mm slices were carried out through the heart. The data set was analyzed on a dedicated work station and scored using the [place].  FINDINGS: Non-cardiac: See separate report from [facility].  Ascending Aorta: Normal size, minimal calcifications.  Pericardium: Normal.  Coronary arteries: Normal origin.  Mild mitral annular calcifications.  IMPRESSION: Coronary calcium score of 0. This was 0 percentile for age and sex matched control.  Ena Dawley  Electronically Signed: By: Ena Dawley On: 07/02/2019 11:19          Assessment & Plan    1.  Paroxysmal AF: -Patient is currently on flecainide 75 mg twice daily and metoprolol 25 mg 3 times per day -Today she is sinus rhythm and rate controlled and reports no episodes of tachycardia or palpitations. -She is tolerating Eliquis 5 mg twice daily no complaints of bleeding -CHA2DS2-VASc Score = 6  [CHF History: 1, HTN History: 1, Diabetes History: 0, Stroke History: 0, Vascular Disease History: 1, Age Score: 2, Gender Score: 1].  Therefore, the patient's annual risk of stroke is 9.7 %.       2.  Vasovagal syncope: -Patient reports that she experienced an episode of presyncope that occurred while vomiting.  She was advised that her episode was caused by a vasovagal reaction and was not cardiac in nature. -She was recently started on famotidine by her PCP. -She was advised to contact our office if she experiences any more syncope or presyncope events that occur without vasovagal episodes.   3.  Hyperlipidemia: -Patient's last LDL was 59 -Continue Crestor 5 mg daily   4.  Essential hypertension: -Patient's blood pressure today was controlled well-controlled at 118/74 -She will continue amlodipine 2.5 mg twice daily, metoprolol 25 mg 3 times daily  5.  Obesity: -Patient's current BMI is 40.27 -During her visit today we discussed the importance of weight loss and preventing progressive coronary artery disease. -Ambulatory referral to Pharm.D. to discuss [id number]  Disposition: Follow-up with  [name], MD as scheduled    Medication Adjustments/Labs and Tests Ordered: Current medicines are reviewed at length with the patient today.  Concerns regarding medicines are outlined above.   Signed, Mable Fill, Marissa Nestle, NP 01/03/2023, 4:12 PM [facility]  Note:  This document was prepared using Dragon voice recognition software and may include unintentional dictation errors.

## 2023-01-03 ENCOUNTER — Encounter: Payer: Self-pay | Admitting: Nurse Practitioner

## 2023-01-03 ENCOUNTER — Ambulatory Visit: Payer: Medicare Other | Attending: Nurse Practitioner | Admitting: Nurse Practitioner

## 2023-01-03 VITALS — BP 118/74 | HR 63 | Ht 65.0 in | Wt 242.0 lb

## 2023-01-03 DIAGNOSIS — I4819 Other persistent atrial fibrillation: Secondary | ICD-10-CM | POA: Insufficient documentation

## 2023-01-03 DIAGNOSIS — I10 Essential (primary) hypertension: Secondary | ICD-10-CM | POA: Diagnosis not present

## 2023-01-03 DIAGNOSIS — R55 Syncope and collapse: Secondary | ICD-10-CM

## 2023-01-03 DIAGNOSIS — Z6841 Body Mass Index (BMI) 40.0 and over, adult: Secondary | ICD-10-CM

## 2023-01-03 DIAGNOSIS — E785 Hyperlipidemia, unspecified: Secondary | ICD-10-CM | POA: Insufficient documentation

## 2023-01-03 NOTE — Patient Instructions (Addendum)
Medication Instructions:  You have been referred to PharmD to discuss [facility] *If you need a refill on your cardiac medications before your next appointment, please call your pharmacy*   Lab Work: None Ordered   Testing/Procedures: None ordered   Follow-Up: At [facility], you and your health needs are our priority.  As part of our continuing mission to provide you with exceptional heart care, we have created designated Provider Care Teams.  These Care Teams include your primary Cardiologist (physician) and Advanced Practice Providers (APPs -  Physician Assistants and Nurse Practitioners) who all work together to provide you with the care you need, when you need it.  We recommend signing up for the patient portal called "MyChart".  Sign up information is provided on this After Visit Summary.  MyChart is used to connect with patients for Virtual Visits (Telemedicine).  Patients are able to view lab/test results, encounter notes, upcoming appointments, etc.  Non-urgent messages can be sent to your provider as well.   To learn more about what you can do with MyChart, go to [email or web address].    Your next appointment:   FOLLOW UP AS SCHEDULED   Provider:   [name], MD     Other Instructions

## 2023-01-07 DIAGNOSIS — Z6838 Body mass index (BMI) 38.0-38.9, adult: Secondary | ICD-10-CM | POA: Diagnosis not present

## 2023-01-07 DIAGNOSIS — E669 Obesity, unspecified: Secondary | ICD-10-CM | POA: Diagnosis not present

## 2023-01-07 DIAGNOSIS — M5136 Other intervertebral disc degeneration, lumbar region: Secondary | ICD-10-CM | POA: Diagnosis not present

## 2023-01-07 DIAGNOSIS — M0609 Rheumatoid arthritis without rheumatoid factor, multiple sites: Secondary | ICD-10-CM | POA: Diagnosis not present

## 2023-01-07 DIAGNOSIS — Z79899 Other long term (current) drug therapy: Secondary | ICD-10-CM | POA: Diagnosis not present

## 2023-01-07 DIAGNOSIS — M1991 Primary osteoarthritis, unspecified site: Secondary | ICD-10-CM | POA: Diagnosis not present

## 2023-01-08 ENCOUNTER — Telehealth: Payer: Medicare Other | Admitting: Cardiology

## 2023-01-09 ENCOUNTER — Ambulatory Visit (INDEPENDENT_AMBULATORY_CARE_PROVIDER_SITE_OTHER): Payer: Medicare Other | Admitting: Podiatry

## 2023-01-09 ENCOUNTER — Encounter: Payer: Self-pay | Admitting: Podiatry

## 2023-01-09 DIAGNOSIS — L03032 Cellulitis of left toe: Secondary | ICD-10-CM

## 2023-01-09 DIAGNOSIS — L923 Foreign body granuloma of the skin and subcutaneous tissue: Secondary | ICD-10-CM

## 2023-01-09 NOTE — Progress Notes (Signed)
Subjective:   Patient ID: Amanda Wilkins, female   DOB: 77 y.o.   MRN: [id number]   HPI Patient states she is going to [place] and is developed some irritation within the left hallux medial border and also felt like she stopped stepped on something on her left foot   ROS      Objective:  Physical Exam  Neurovascular status intact with patient left hallux medial border showing some redness and mild discomfort no active drainage and on the plantar left there is a small irritation which has a chance of being a foreign body     Assessment:  Possibility for foreign body left and paronychia left hallux     Plan:  Discussed going in cleaning out the nail border she is nervous to do this before her trip so she is going to start soaks and I advised her on if it were to get worse it is going to need to be worked on and I went ahead and using sharp sterile Tatian I did evaluate the plantar hide there is possibility of small little hairs which were cleaned out do not recommend further treatment and I did go over with her soaks

## 2023-01-13 ENCOUNTER — Other Ambulatory Visit: Payer: Self-pay | Admitting: Internal Medicine

## 2023-01-14 ENCOUNTER — Ambulatory Visit (INDEPENDENT_AMBULATORY_CARE_PROVIDER_SITE_OTHER): Payer: Medicare Other

## 2023-01-14 ENCOUNTER — Ambulatory Visit (INDEPENDENT_AMBULATORY_CARE_PROVIDER_SITE_OTHER): Payer: Medicare Other | Admitting: Podiatry

## 2023-01-14 DIAGNOSIS — M7752 Other enthesopathy of left foot: Secondary | ICD-10-CM

## 2023-01-14 DIAGNOSIS — M7672 Peroneal tendinitis, left leg: Secondary | ICD-10-CM | POA: Diagnosis not present

## 2023-01-14 DIAGNOSIS — R600 Localized edema: Secondary | ICD-10-CM

## 2023-01-14 MED ORDER — TRIAMCINOLONE ACETONIDE 10 MG/ML IJ SUSP
10.0000 mg | Freq: Once | INTRAMUSCULAR | Status: AC
  Administered 2023-01-14: 10 mg

## 2023-01-16 ENCOUNTER — Other Ambulatory Visit: Payer: Self-pay | Admitting: Internal Medicine

## 2023-01-16 MED ORDER — HYDROCODONE-ACETAMINOPHEN 10-325 MG PO TABS: 1.0000 | ORAL_TABLET | Freq: Four times a day (QID) | ORAL | 0 refills | Status: AC | PRN

## 2023-01-16 NOTE — Progress Notes (Signed)
Subjective:   Patient ID: Amanda Wilkins, female   DOB: 77 y.o.   MRN: [id number]   HPI Patient states that her left foot and ankle have really started to swell over the last week and she is getting ready to go out of town and it is hurting a lot on the outside    ROS      Objective:  Physical Exam  Neuro vascular status intact with patient found to have inflammation of the lateral side of the foot at the peroneal insertion base of fifth metatarsal with quite a bit of swelling in the midfoot and into the ankle left negative Bevelyn Buckles' sign noted no indications of systemic condition     Assessment:  Probability for localized edematous inflammatory condition secondary to new shoe gear which may have caused stress     Plan:  H&P reviewed conditions I went ahead today I did sterile prep and I injected the left peroneal 3 mg Kenalog 5 mg Xylocaine near insertion I applied through the boot surgical shoe to help with the swelling and I advised her to try to leave that on 3 to 4 days and I want to see her back again in a week before she leaves for her trip  X-rays were negative for signs of fracture appears to be a soft tissue injury

## 2023-01-17 ENCOUNTER — Telehealth: Payer: Self-pay | Admitting: Internal Medicine

## 2023-01-17 ENCOUNTER — Ambulatory Visit: Payer: Medicare Other | Attending: Cardiology | Admitting: Cardiology

## 2023-01-17 VITALS — Wt 243.0 lb

## 2023-01-17 DIAGNOSIS — R635 Abnormal weight gain: Secondary | ICD-10-CM | POA: Insufficient documentation

## 2023-01-17 NOTE — Patient Instructions (Signed)
[employer] functional nutrition

## 2023-01-17 NOTE — Telephone Encounter (Signed)
Patient stated for the past couple of days she noticed both of her legs are swollen. Yesterday she experienced brief moment of shortness of breath. Pt stated she is going out of town next week Wednesday and will like advise from Dr. [name]. Pt does not monitor her weight , AM bp 127/73, HR 75. Will forward to MD and nurse.

## 2023-01-17 NOTE — Telephone Encounter (Signed)
I would recomm furosemide 40 mg with 20 KCL Can take 1  of each daily for a few days   See how swelling changes

## 2023-01-17 NOTE — Telephone Encounter (Signed)
Pt c/o swelling: STAT is pt has developed SOB within 24 hours  If swelling, where is the swelling located? Both legs   How much weight have you gained and in what time span? Not sure   Have you gained 3 pounds in a day or 5 pounds in a week? Not sure   Do you have a log of your daily weights (if so, list)? Did not take   Are you currently taking a fluid pill? No   Are you currently SOB? No   Have you traveled recently?  No   Pt states she has been swelling in both legs and she is going out of town next Wednesday and wants to know what she should do

## 2023-01-17 NOTE — Telephone Encounter (Signed)
Patient is following up. She understands that Dr. [name] is not in the office for the remainder of the week. She would like to know if someone else can advise her until then.

## 2023-01-17 NOTE — Progress Notes (Signed)
Patient ID: Amanda Wilkins                 DOB: 10-Sep-1946                    MRN: [id number]      HPI: Amanda Wilkins is a 77 y.o. female patient referred to pharmacy clinic by Amanda Pancoast, NP to initiate weight loss therapy with GLP1-RA. PMH is significant for obesity complicated by chronic medical conditions including paroxysmal AF/flutter (on Eliquis), HTN, obesity, chronic interstitial nephritis, OSA (on CPAP), PSVT, mild carotid disease (1-39%), HLD, aortic atherosclerosis, mild MR, right total hip arthroplasty . Most recent BMI 40.   Patient presents today to PharmD clinic. States she has tried several things in the past for weight loss. Can't ever seem to get passed a certain weight. Weakness are chocolate and sweets. 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 Now goes to [employer]. She has cut back on her alcohol to only weekend, if that. When she goes to [facility] she rides the new step for 30 min. Sounds like intensity could be increased. Then uses some of the machines. Has an issue with her knee and doesn't trust it doing down the steps.  Diet:  -Breakfast: nothing -Lunch: hard boiled egg -Dinner: chicken leg, siroloin burgers, beets, sweet potato, green beans, salad w/ oil and vinegar -Snacks: pistachios, cottage cheese, apple -Drinks: water, black coffee, decaf tea Cut back to wine just on the weekends and sometimes doesn't even have  Exercise: new step machine (30 min)  Social History: wine on weekends  Labs: Lab Results  Component Value Date   HGBA1C 5.8 (H) 01/30/2015    Wt Readings from Last 1 Encounters:  01/03/23 242 lb (109.8 kg)    BP Readings from Last 1 Encounters:  01/03/23 118/74   Pulse Readings from Last 1 Encounters:  01/03/23 63       Component Value Date/Time   CHOL 168 03/06/2022 1143   TRIG 52 03/06/2022 1143   HDL 98 03/06/2022 1143   CHOLHDL 1.7 03/06/2022  1143   CHOLHDL 2 05/28/2018 0837   VLDL 13.8 05/28/2018 0837   LDLCALC 59 03/06/2022 1143    Past Medical History:  Diagnosis Date   Acute bronchitis 09/11/2016   11/17 refractory   Acute cystitis without hematuria 05/17/2015   Acute kidney injury [facility])    Labs today   Acute right ankle pain 09/09/2018   Anticoagulant long-term use    Xarelto   Axillary pain    Bronchitis    Cellulitis    Cholecystitis    Chronic interstitial nephritis    CONJUNCTIVITIS, ACUTE 10/18/2010   Qualifier: Diagnosis of  By: Plotnikov MD, Evie Lacks    D-dimer, elevated    Depression    Eye inflammation    GERD (gastroesophageal reflux disease)    History of interstitial nephritis 2016   chronic   History of kidney stones    has a small one found on xray   History of nuclear stress test 12/16/2014   Intermediate risk nuclear study w/ medium size moderate severity reversible defect in the basal and mid inferolateral and inferior wall (per dr cardiology note , dr [name] did not think this was consistent with ischemia)/  normal LV function and wall motion, ef 75%   History of septic shock 01/22/2015   in setting Group A Strep Cellulits erysipelas/chest wall  induration with streptoccocus basterium-- Severe sepsis, DIC, Acute respiratory failure with pulmonary edema, Acute Kidney failure with chronic interstitial nephritis   Hypertension    Hyponatremia    Migraines    on Zoloft for migraines   Mild carotid artery disease ([place])    per duplex 08-30-2017 bilateral ICA 1-39%   OA (osteoarthritis) rheumotologist-  dr [name]   both knees,  shoulders, ankles   OSA on CPAP     moderate obstructive sleep apnea with an AHI of 23.4/h and oxygen desaturations as low as 84%.  Now on CPAP at 12 cm H2O.   PAF (paroxysmal atrial fibrillation) [facility]) 2009   cardiologist-- dr [name]   Paroxysmal atrial flutter [facility])    a. dx 11/2017.   PSVT (paroxysmal supraventricular tachycardia)    RA (rheumatoid  arthritis) (HCC)    Wears contact lenses     Current Outpatient Medications on File Prior to Visit  Medication Sig Dispense Refill   acetaminophen (TYLENOL) 500 MG tablet Take 1,000 mg by mouth daily as needed for moderate pain or mild pain (pain).     ALPRAZolam (XANAX) 0.25 MG tablet Take 1 tablet (0.25 mg total) by mouth 2 (two) times daily as needed for anxiety. 60 tablet 3   apixaban (ELIQUIS) 5 MG TABS tablet TAKE 1 TABLET(5 MG) BY MOUTH TWICE DAILY 60 tablet 11   cholecalciferol (VITAMIN D3) 25 MCG (1000 UT) tablet Take 1,000 Units by mouth daily.     EPINEPHrine 0.3 mg/0.3 mL IJ SOAJ injection Inject 0.3 mg into the muscle as needed for anaphylaxis. As needed for life-threatening allergic reactions 2 each 1   famotidine (PEPCID) 20 MG tablet Take 1 tablet (20 mg total) by mouth at bedtime. 90 tablet 3   flecainide (TAMBOCOR) 50 MG tablet Take 1.5 tablets (75 mg total) by mouth 2 (two) times daily. 270 tablet 3   HYDROcodone-acetaminophen (NORCO) 10-325 MG tablet Take 1 tablet by mouth every 6 (six) hours as needed for up to 5 days. 20 tablet 0   hydroxychloroquine (PLAQUENIL) 200 MG tablet Take 400 mg by mouth every morning.     metoprolol tartrate (LOPRESSOR) 25 MG tablet Take 1 tablet (25 mg total) by mouth 3 (three) times daily. CAN TAKE AN ADDITIONAL TAB AS NEEDED FOR PALPITATIONS 90 tablet 3   Probiotic Product (ALIGN) 4 MG CAPS Take 4 mg by mouth daily.     rosuvastatin (CRESTOR) 5 MG tablet TAKE 1 TABLET(5 MG) BY MOUTH DAILY 90 tablet 2   sertraline (ZOLOFT) 50 MG tablet TAKE 1 TABLET BY MOUTH DAILY 90 tablet 1   No current facility-administered medications on file prior to visit.    Allergies  Allergen Reactions   Epinephrine Base Other (See Comments)    Seriously increases heart rate   Aspirin Other (See Comments)    Can take the coated 325 mg. Plain asa 325 mg speeds up the heart.   Benadryl [Diphenhydramine]     Increased heart rate   Covid-19 Ad26 Vaccine(Janssen)  Hives     hives 6 hrs after her COVID 19 2nd booster - resolved    Fish Allergy Hives    Was imitation crab meat and broke out in hives, had skin testing for lobster and showed allergic   Hylan G-F 20 Other (See Comments)    Very painful (knee injection)   Penicillins Other (See Comments)    Pt previously has been told not to take because of family history of reactions (water blisters).  She took amoxicillin in 2012-2013 with no reaction Pt received ancef on 11-16-2021 without issue   Tape Other (See Comments)    Blisters from adhesive tape  Blisters from adhesive tape, Other reaction(s): Other (See Comments), Blisters from adhesive tape   Wound Dressing Adhesive Other (See Comments)    Blisters from adhesive tape     Assessment/Plan:  1. Weight loss - Patient is on medicare. She would not meet the definition of CAD for them to cover [facility]. Even if she did, the cost would be $100-200/month initially and then $300-400 in the coverage gap. Also wegovy is on backorder an very hard to find.   We discussed tracking calories or marcos to determine exactly what she is eating. I think she is consuming more calories than she thinks. I recommended a few companies that can aid in programing maco # and hold her accountable. I also encouraged her to increase intensity at the gym. I am not recommending she exert herself to great extent but to increase the intensity with cardio and work just short of failure on weighted reps.  Thank you,  Ramond Dial, Pharm.D, BCPS, CPP [facility] [address], [place], [place] [address]  Phone: [phone]; Fax: [phone]

## 2023-01-17 NOTE — Telephone Encounter (Signed)
Legs feel swollen.  Shoes leaving marks. Still has mark this morning from yesterday.   Tuesday night salty dinner. Lamb and salad, cottage cheese beets and black olives and tuna at lunch.    Easter weekend salty ham. Son cooked.   Probably had more salt than normally do.  She is getting out of breath more - feeling tight around her middle.  At the top of her stomach last couple days indigestion.  Hurts at top of stomach really bad.  Took Gas X and belching.  Moving bowels normally.    BP this am 127/73, 75.    I scheduled her with APP tomorrow afternoon.  She is grateful for appointment since she will be going out of town for several weeks soon.

## 2023-01-18 ENCOUNTER — Emergency Department (HOSPITAL_BASED_OUTPATIENT_CLINIC_OR_DEPARTMENT_OTHER): Payer: Medicare Other

## 2023-01-18 ENCOUNTER — Other Ambulatory Visit: Payer: Self-pay | Admitting: Nurse Practitioner

## 2023-01-18 ENCOUNTER — Emergency Department (HOSPITAL_BASED_OUTPATIENT_CLINIC_OR_DEPARTMENT_OTHER)
Admission: EM | Admit: 2023-01-18 | Discharge: 2023-01-18 | Disposition: A | Discharge status: Home / Self Care | Payer: Medicare Other | Attending: Emergency Medicine | Admitting: Emergency Medicine

## 2023-01-18 ENCOUNTER — Ambulatory Visit (INDEPENDENT_AMBULATORY_CARE_PROVIDER_SITE_OTHER): Payer: Medicare Other | Admitting: Nurse Practitioner

## 2023-01-18 ENCOUNTER — Encounter: Payer: Self-pay | Admitting: Nurse Practitioner

## 2023-01-18 ENCOUNTER — Encounter (HOSPITAL_BASED_OUTPATIENT_CLINIC_OR_DEPARTMENT_OTHER): Payer: Self-pay

## 2023-01-18 VITALS — BP 122/70 | HR 57 | Ht 65.0 in | Wt 241.0 lb

## 2023-01-18 DIAGNOSIS — I110 Hypertensive heart disease with heart failure: Secondary | ICD-10-CM | POA: Diagnosis not present

## 2023-01-18 DIAGNOSIS — R1013 Epigastric pain: Secondary | ICD-10-CM

## 2023-01-18 DIAGNOSIS — I10 Essential (primary) hypertension: Secondary | ICD-10-CM | POA: Insufficient documentation

## 2023-01-18 DIAGNOSIS — Z79899 Other long term (current) drug therapy: Secondary | ICD-10-CM | POA: Diagnosis not present

## 2023-01-18 DIAGNOSIS — I5032 Chronic diastolic (congestive) heart failure: Secondary | ICD-10-CM | POA: Insufficient documentation

## 2023-01-18 DIAGNOSIS — Z7901 Long term (current) use of anticoagulants: Secondary | ICD-10-CM | POA: Diagnosis not present

## 2023-01-18 DIAGNOSIS — H2513 Age-related nuclear cataract, bilateral: Secondary | ICD-10-CM | POA: Diagnosis not present

## 2023-01-18 DIAGNOSIS — H31091 Other chorioretinal scars, right eye: Secondary | ICD-10-CM | POA: Diagnosis not present

## 2023-01-18 DIAGNOSIS — R109 Unspecified abdominal pain: Secondary | ICD-10-CM | POA: Diagnosis not present

## 2023-01-18 DIAGNOSIS — I480 Paroxysmal atrial fibrillation: Secondary | ICD-10-CM | POA: Insufficient documentation

## 2023-01-18 DIAGNOSIS — H43812 Vitreous degeneration, left eye: Secondary | ICD-10-CM | POA: Diagnosis not present

## 2023-01-18 DIAGNOSIS — N200 Calculus of kidney: Secondary | ICD-10-CM | POA: Diagnosis not present

## 2023-01-18 LAB — COMPREHENSIVE METABOLIC PANEL
ALT: 81 U/L — ABNORMAL HIGH (ref 0–44)
AST: 58 U/L — ABNORMAL HIGH (ref 15–41)
Albumin: 4.7 g/dL (ref 3.5–5.0)
Alkaline Phosphatase: 81 U/L (ref 38–126)
Anion gap: 10 (ref 5–15)
BUN: 15 mg/dL (ref 8–23)
CO2: 24 mmol/L (ref 22–32)
Calcium: 10 mg/dL (ref 8.9–10.3)
Chloride: 99 mmol/L (ref 98–111)
Creatinine, Ser: 0.84 mg/dL (ref 0.44–1.00)
GFR, Estimated: >60 mL/min (ref >60)
Glucose, Bld: 117 mg/dL — ABNORMAL HIGH (ref 70–99)
Potassium: 4.6 mmol/L (ref 3.5–5.1)
Sodium: 133 mmol/L — ABNORMAL LOW (ref 135–145)
Total Bilirubin: 1.2 mg/dL (ref 0.3–1.2)
Total Protein: 7.5 g/dL (ref 6.5–8.1)

## 2023-01-18 LAB — CBC WITH DIFFERENTIAL/PLATELET
Abs Immature Granulocytes: 0.01 10*3/uL (ref 0.00–0.07)
Basophils Absolute: 0.1 10*3/uL (ref 0.0–0.1)
Basophils Relative: 1 %
Eosinophils Absolute: 0.1 10*3/uL (ref 0.0–0.5)
Eosinophils Relative: 1 %
HCT: 36.3 % (ref 36.0–46.0)
Hemoglobin: 12.1 g/dL (ref 12.0–15.0)
Immature Granulocytes: 0 %
Lymphocytes Relative: 26 %
Lymphs Abs: 1.6 10*3/uL (ref 0.7–4.0)
MCH: 32.2 pg (ref 26.0–34.0)
MCHC: 33.3 g/dL (ref 30.0–36.0)
MCV: 96.5 fL (ref 80.0–100.0)
Monocytes Absolute: 0.3 10*3/uL (ref 0.1–1.0)
Monocytes Relative: 6 %
Neutro Abs: 4.1 10*3/uL (ref 1.7–7.7)
Neutrophils Relative %: 66 %
Platelets: 193 10*3/uL (ref 150–400)
RBC: 3.76 MIL/uL — ABNORMAL LOW (ref 3.87–5.11)
RDW: 14.3 % (ref 11.5–15.5)
WBC: 6.2 10*3/uL (ref 4.0–10.5)
nRBC: 0 % (ref 0.0–0.2)

## 2023-01-18 LAB — LIPASE, BLOOD: Lipase: 34 U/L (ref 11–51)

## 2023-01-18 MED ORDER — FENTANYL CITRATE PF 50 MCG/ML IJ SOSY
50.0000 ug | PREFILLED_SYRINGE | Freq: Once | INTRAMUSCULAR | Status: DC
  Filled 2023-01-18: qty 1

## 2023-01-18 MED ORDER — FUROSEMIDE 20 MG PO TABS: ORAL_TABLET | ORAL | 0 refills | Status: DC

## 2023-01-18 MED ORDER — IOHEXOL 300 MG/ML  SOLN
100.0000 mL | Freq: Once | INTRAMUSCULAR | Status: AC | PRN
  Administered 2023-01-18: 80 mL via INTRAVENOUS

## 2023-01-18 MED ORDER — PANTOPRAZOLE SODIUM 40 MG IV SOLR
40.0000 mg | Freq: Once | INTRAVENOUS | Status: AC
  Administered 2023-01-18: 40 mg via INTRAVENOUS
  Filled 2023-01-18: qty 10

## 2023-01-18 MED ORDER — ALUM & MAG HYDROXIDE-SIMETH 200-200-20 MG/5ML PO SUSP
30.0000 mL | Freq: Once | ORAL | Status: AC
  Administered 2023-01-18: 30 mL via ORAL
  Filled 2023-01-18: qty 30

## 2023-01-18 MED ORDER — SUCRALFATE 1 G PO TABS: 1.0000 g | ORAL_TABLET | Freq: Three times a day (TID) | ORAL | 0 refills | Status: DC

## 2023-01-18 MED ORDER — POTASSIUM CHLORIDE CRYS ER 20 MEQ PO TBCR: EXTENDED_RELEASE_TABLET | ORAL | 0 refills | Status: DC

## 2023-01-18 NOTE — ED Provider Notes (Signed)
[place] EMERGENCY DEPARTMENT AT [facility] Provider Note   CSN: [id number] Arrival date & time: 01/18/23  1058     History {Add pertinent medical, surgical, social history, OB history to HPI:1} Chief Complaint  Patient presents with   Abdominal Pain    Amanda Wilkins is a 77 y.o. female.  HPI    77 year old female comes in with chief complaint of epigastric abdominal pain.  Patient is status postcholecystectomy from last year.  She indicates that this current pain has been present for the last 3 days.  Pain is constant, in the epigastric region and described as sharp pain that is nonradiating.  There is no specific evoking, aggravating or relieving factors.  She has reduced her p.o. intake, but the pain has been persistent.  Pain rated about 7 out of 10.  Patient denies any associated vomiting, diarrhea and her bowel movements have been normal.  Patient feels bloated, she has taken gas medications but has not experience significant relief.  Home Medications Prior to Admission medications   Medication Sig Start Date End Date Taking? Authorizing Provider  acetaminophen (TYLENOL) 500 MG tablet Take 1,000 mg by mouth daily as needed for moderate pain or mild pain (pain).    [provider]  ALPRAZolam Prudy Feeler) 0.25 MG tablet Take 1 tablet (0.25 mg total) by mouth 2 (two) times daily as needed for anxiety. 07/18/22   Plotnikov, Georgina Quint, MD  apixaban (ELIQUIS) 5 MG TABS tablet TAKE 1 TABLET(5 MG) BY MOUTH TWICE DAILY 11/06/22   Pricilla Riffle, MD  cholecalciferol (VITAMIN D3) 25 MCG (1000 UT) tablet Take 1,000 Units by mouth daily.    [provider]  EPINEPHrine 0.3 mg/0.3 mL IJ SOAJ injection Inject 0.3 mg into the muscle as needed for anaphylaxis. As needed for life-threatening allergic reactions 04/02/22   Alfonse Spruce, MD  famotidine (PEPCID) 20 MG tablet Take 1 tablet (20 mg total) by mouth at bedtime. 12/11/22   Plotnikov, Georgina Quint, MD   flecainide (TAMBOCOR) 50 MG tablet Take 1.5 tablets (75 mg total) by mouth 2 (two) times daily. 11/06/22   Pricilla Riffle, MD  HYDROcodone-acetaminophen [facility]) 10-325 MG tablet Take 1 tablet by mouth every 6 (six) hours as needed for up to 5 days. 01/16/23 01/21/23  Plotnikov, Georgina Quint, MD  hydroxychloroquine (PLAQUENIL) 200 MG tablet Take 400 mg by mouth every morning.    [provider]  metoprolol tartrate (LOPRESSOR) 25 MG tablet Take 1 tablet (25 mg total) by mouth 3 (three) times daily. CAN TAKE AN ADDITIONAL TAB AS NEEDED FOR PALPITATIONS 11/06/22   Pricilla Riffle, MD  Probiotic Product (ALIGN) 4 MG CAPS Take 4 mg by mouth daily.    [provider]  rosuvastatin (CRESTOR) 5 MG tablet TAKE 1 TABLET(5 MG) BY MOUTH DAILY 10/19/22   Plotnikov, Georgina Quint, MD  sertraline (ZOLOFT) 50 MG tablet TAKE 1 TABLET BY MOUTH DAILY 01/14/23   Plotnikov, Georgina Quint, MD      Allergies    Epinephrine base, Aspirin, Benadryl [diphenhydramine], Covid-19 ad26 vaccine(janssen), Fish allergy, Hylan g-f 20, Penicillins, Tape, and Wound dressing adhesive    Review of Systems   Review of Systems  All other systems reviewed and are negative.   Physical Exam Updated Vital Signs BP (!) 104/90 (BP Location: Right Arm)   Pulse 69   Temp 97.6 F (36.4 C) (Oral)   Resp 18   SpO2 98%  Physical Exam Vitals and nursing note reviewed.  Constitutional:  Appearance: She is well-developed.  HENT:     Head: Normocephalic and atraumatic.  Eyes:     Extraocular Movements: Extraocular movements intact.  Cardiovascular:     Rate and Rhythm: Normal rate.  Pulmonary:     Effort: Pulmonary effort is normal.  Abdominal:     Tenderness: There is abdominal tenderness in the right upper quadrant and epigastric area. There is no guarding or rebound. Negative signs include Murphy's sign.  Musculoskeletal:     Cervical back: Normal range of motion and neck supple.  Skin:    General: Skin is dry.  Neurological:      Mental Status: She is alert and oriented to person, place, and time.     ED Results / Procedures / Treatments   Labs (all labs ordered are listed, but only abnormal results are displayed) Labs Reviewed  CBC WITH DIFFERENTIAL/PLATELET - Abnormal; Notable for the following components:      Result Value   RBC 3.76 (*)    All other components within normal limits  COMPREHENSIVE METABOLIC PANEL - Abnormal; Notable for the following components:   Sodium 133 (*)    Glucose, Bld 117 (*)    AST 58 (*)    ALT 81 (*)    All other components within normal limits  LIPASE, BLOOD    EKG None  Radiology No results found.  Procedures Procedures  {Document cardiac monitor, telemetry assessment procedure when appropriate:1}  Medications Ordered in ED Medications  fentaNYL (SUBLIMAZE) injection 50 mcg (50 mcg Intravenous Not Given 01/18/23 1158)  alum & mag hydroxide-simeth (MAALOX/MYLANTA) 200-200-20 MG/5ML suspension 30 mL (30 mLs Oral Given 01/18/23 1156)  pantoprazole (PROTONIX) injection 40 mg (40 mg Intravenous Given 01/18/23 1156)    ED Course/ Medical Decision Making/ A&P   {   Click here for ABCD2, HEART and other calculatorsREFRESH Note before signing :1}                          Medical Decision Making Amount and/or Complexity of Data Reviewed Labs: ordered. Radiology: ordered.  Risk OTC drugs. Prescription drug management.   This patient presents to the ED with chief complaint(s) of epigastric abdominal pain with pertinent past medical history of cholecystectomy last year and a normal upper endoscopy in 2015.The complaint involves an extensive differential diagnosis and also carries with it a high risk of complications and morbidity.    The differential diagnosis includes : Pancreatitis, Hepatobiliary pathology including cholelithiasis and cholecystitis, Gastritis/peptic ulcer disease, small bowel obstruction, Acute coronary syndrome, Aortic Dissection   The initial  plan is to get basic labs.  We will proceed with CT abdomen pelvis with contrast in the light of patient awaiting international trip in 5 days.   Additional history obtained: Records reviewed  previous endoscopy report, which reveals no concerning findings.  I have reviewed patient's GI visits and also her medications.  Independent labs interpretation:  The following labs were independently interpreted: ***  Independent visualization and interpretation of imaging: - I independently visualized the following imaging with scope of interpretation limited to determining acute life threatening conditions related to emergency care: ***, which revealed ***  Treatment and Reassessment: ***  Consultation: - Consulted or discussed management/test interpretation with external professional: ***  Consideration for admission or further workup:  Social Determinants of health:  Final Clinical Impression(s) / ED Diagnoses Final diagnoses:  None    Rx / DC Orders ED Discharge Orders     None

## 2023-01-18 NOTE — Discharge Instructions (Signed)
We saw you in the ER for the abdominal pain. °All the results in the ER are normal, labs and imaging. °We are not sure what is causing your symptoms. ° °The workup in the ER is not complete, and is limited to screening for life threatening and emergent conditions only, so please see a primary care doctor for further evaluation. ° °

## 2023-01-18 NOTE — Progress Notes (Signed)
Office Visit    Patient Name: Amanda Wilkins Date of Encounter: 01/18/2023  Primary Care Provider:  Tresa Garter, MD Primary Cardiologist:  Dietrich Pates, MD Primary Electrophysiologist: None  Chief Complaint    Amanda Wilkins is a 77 y.o. female with PMH of paroxysmal AF/flutter (on Eliquis), HTN, obesity, chronic interstitial nephritis, OSA (on CPAP), PSVT, mild carotid disease (1-39%), HLD, aortic atherosclerosis, mild MR, right total hip arthroplasty who presents today for increased lower extremity swelling.  Past Medical History    Past Medical History:  Diagnosis Date   Acute bronchitis 09/11/2016   11/17 refractory   Acute cystitis without hematuria 05/17/2015   Acute kidney injury [facility])    Labs today   Acute right ankle pain 09/09/2018   Anticoagulant long-term use    Xarelto   Axillary pain    Bronchitis    Cellulitis    Cholecystitis    Chronic interstitial nephritis    CONJUNCTIVITIS, ACUTE 10/18/2010   Qualifier: Diagnosis of  By: Plotnikov MD, Georgina Quint    D-dimer, elevated    Depression    Eye inflammation    GERD (gastroesophageal reflux disease)    History of interstitial nephritis 2016   chronic   History of kidney stones    has a small one found on xray   History of nuclear stress test 12/16/2014   Intermediate risk nuclear study w/ medium size moderate severity reversible defect in the basal and mid inferolateral and inferior wall (per dr cardiology note , dr Dietrich Pates did not think this was consistent with ischemia)/  normal LV function and wall motion, ef 75%   History of septic shock 01/22/2015   in setting Group A Strep Cellulits erysipelas/chest wall induration with streptoccocus basterium-- Severe sepsis, DIC, Acute respiratory failure with pulmonary edema, Acute Kidney failure with chronic interstitial nephritis   Hypertension    Hyponatremia    Migraines    on Zoloft for migraines   Mild carotid artery disease (HCC)     per duplex 08-30-2017 bilateral ICA 1-39%   OA (osteoarthritis) rheumotologist-  dr Zenovia Jordan   both knees,  shoulders, ankles   OSA on CPAP     moderate obstructive sleep apnea with an AHI of 23.4/h and oxygen desaturations as low as 84%.  Now on CPAP at 12 cm H2O.   PAF (paroxysmal atrial fibrillation) [facility]) 2009   cardiologist-- dr Dietrich Pates   Paroxysmal atrial flutter [facility])    a. dx 11/2017.   PSVT (paroxysmal supraventricular tachycardia)    RA (rheumatoid arthritis) (HCC)    Wears contact lenses    Past Surgical History:  Procedure Laterality Date   BREAST BIOPSY Right 05/15/2017   PASH   CARDIOVERSION N/A 08/28/2021   Procedure: CARDIOVERSION;  Surgeon: Pricilla Riffle, MD;  Location: [facility] ENDOSCOPY;  Service: Cardiovascular;  Laterality: N/A;   CESAREAN SECTION  1980   CHOLECYSTECTOMY N/A 11/16/2021   Procedure: LAPAROSCOPIC CHOLECYSTECTOMY;  Surgeon: Axel Filler, MD;  Location: [facility] OR;  Service: General;  Laterality: N/A;   ESOPHAGOGASTRODUODENOSCOPY N/A 02/08/2015   Procedure: ESOPHAGOGASTRODUODENOSCOPY (EGD);  Surgeon: Louis Meckel, MD;  Location: [facility] ENDOSCOPY;  Service: Endoscopy;  Laterality: N/A;   KNEE ARTHROSCOPY W/ MENISCAL REPAIR Left 08/2014    @[facility]    SHOULDER SURGERY Right 04/04/2016   TOTAL HIP ARTHROPLASTY Right 06/19/2022   Procedure: RIGHT TOTAL HIP ARTHROPLASTY ANTERIOR APPROACH;  Surgeon: Kathryne Hitch, MD;  Location: [facility] OR;  Service: Orthopedics;  Laterality: Right;  TOTAL KNEE ARTHROPLASTY Right 09/01/2018   Procedure: RIGHT TOTAL KNEE ARTHROPLASTY;  Surgeon: Dannielle Huh, MD;  Location: [facility] ORS;  Service: Orthopedics;  Laterality: Right;    Allergies  Allergies  Allergen Reactions   Epinephrine Base Other (See Comments)    Seriously increases heart rate   Aspirin Other (See Comments)    Can take the coated 325 mg. Plain asa 325 mg speeds up the heart.   Benadryl [Diphenhydramine]     Increased heart rate   Covid-19 Ad26  Vaccine(Janssen) Hives     hives 6 hrs after her COVID 19 2nd booster - resolved    Fish Allergy Hives    Was imitation crab meat and broke out in hives, had skin testing for lobster and showed allergic   Hylan G-F 20 Other (See Comments)    Very painful (knee injection)   Penicillins Other (See Comments)    Pt previously has been told not to take because of family history of reactions (water blisters). She took amoxicillin in 2012-2013 with no reaction Pt received ancef on 11-16-2021 without issue   Tape Other (See Comments)    Blisters from adhesive tape  Blisters from adhesive tape, Other reaction(s): Other (See Comments), Blisters from adhesive tape   Wound Dressing Adhesive Other (See Comments)    Blisters from adhesive tape    History of Present Illness   Amanda Wilkins  is a 77 year old female with the above mention past medical history who presents today for of lower extremity swelling.  She was initially seen by Dr. Tenny Craw in 2014 after being followed by Dr. Daleen Squibb for management of atrial fibrillation  She was last seen in follow-up 11/06/2022 by Dr. Tenny Craw.  During visit patient's blood pressure was controlled current amlodipine dose.  She was maintaining sinus rhythm was encouraged to increase exercise program to help her feel better.  She was most recently seen on 01/03/2023 for follow-up and management of complaint of shortness of breath.  She is also experienced an episode of nausea and vomiting with diarrhea that caused momentary fainting without loss of consciousness.  He was euvolemic on exam and blood pressures were normal.  She was also maintaining sinus rhythm patient complains of arrhythmia.  Fainting episode was found to be vasovagal related and had resolved.  She contacted our office on 01/17/2023 with complaint of lower extremity swelling.  She reported consuming salty foods especially over the Easter weekend.  She also endorsed shortness of breath and tightness in in her  abdomen.   [name] presents today for recent complaint of shortness of breath and lower extremity swelling.  Since last being seen in the office patient reports she is experienced ongoing substernal chest discomfort and lower extremity swelling with shortness of breath.  She was seen earlier today in the ED for abdominal pain and had a CT scan completed that was normal.  She reports that during Easter weekend some indiscretions with salt and today is volume up on exam with 2+ pitting edema bilaterally.  Her blood pressure is well-controlled today at 122/70 and heart rate was 57 bpm.  She reports compliance with her medications and denies any adverse reactions.  She is planning to leave for [place] at the end of this month.  During our visit today we discussed the importance of abstaining from processed foods and high salt foods.  Patient denies chest pain, palpitations, dyspnea, PND, orthopnea, nausea, vomiting, dizziness, syncope, edema, weight gain, or early satiety.  Home Medications  Current Outpatient Medications  Medication Sig Dispense Refill   acetaminophen (TYLENOL) 500 MG tablet Take 1,000 mg by mouth daily as needed for moderate pain or mild pain (pain).     ALPRAZolam (XANAX) 0.25 MG tablet Take 1 tablet (0.25 mg total) by mouth 2 (two) times daily as needed for anxiety. 60 tablet 3   apixaban (ELIQUIS) 5 MG TABS tablet TAKE 1 TABLET(5 MG) BY MOUTH TWICE DAILY 60 tablet 11   cholecalciferol (VITAMIN D3) 25 MCG (1000 UT) tablet Take 1,000 Units by mouth daily.     EPINEPHrine 0.3 mg/0.3 mL IJ SOAJ injection Inject 0.3 mg into the muscle as needed for anaphylaxis. As needed for life-threatening allergic reactions 2 each 1   famotidine (PEPCID) 20 MG tablet Take 1 tablet (20 mg total) by mouth at bedtime. 90 tablet 3   flecainide (TAMBOCOR) 50 MG tablet Take 1.5 tablets (75 mg total) by mouth 2 (two) times daily. 270 tablet 3   HYDROcodone-acetaminophen (NORCO) 10-325 MG tablet  Take 1 tablet by mouth every 6 (six) hours as needed for up to 5 days. 20 tablet 0   hydroxychloroquine (PLAQUENIL) 200 MG tablet Take 400 mg by mouth every morning.     metoprolol tartrate (LOPRESSOR) 25 MG tablet Take 1 tablet (25 mg total) by mouth 3 (three) times daily. CAN TAKE AN ADDITIONAL TAB AS NEEDED FOR PALPITATIONS 90 tablet 3   Probiotic Product (ALIGN) 4 MG CAPS Take 4 mg by mouth daily.     rosuvastatin (CRESTOR) 5 MG tablet TAKE 1 TABLET(5 MG) BY MOUTH DAILY 90 tablet 2   sertraline (ZOLOFT) 50 MG tablet TAKE 1 TABLET BY MOUTH DAILY 90 tablet 1   No current facility-administered medications for this visit.     Review of Systems  Please see the history of present illness.    (+) Lower extremity swelling (+) Substernal abdominal pain  All other systems reviewed and are otherwise negative except as noted above.  Physical Exam    Wt Readings from Last 3 Encounters:  01/17/23 243 lb (110.2 kg)  01/03/23 242 lb (109.8 kg)  12/19/22 240 lb (108.9 kg)   XB:JYNWG were no vitals filed for this visit.,There is no height or weight on file to calculate BMI.  Constitutional:      Appearance: Healthy appearance. Not in distress.  Neck:     Vascular: JVD normal.  Pulmonary:     Effort: Pulmonary effort is normal.     Breath sounds: No wheezing. No rales. Diminished in the bases Cardiovascular:     Normal rate. Regular rhythm. Normal S1. Normal S2.      Murmurs: There is no murmur.  Edema:    Peripheral edema absent.  Abdominal:     Palpations: Abdomen is soft non tender. There is no hepatomegaly.  Skin:    General: Skin is warm and dry.  Neurological:     General: No focal deficit present.     Mental Status: Alert and oriented to person, place and time.     Cranial Nerves: Cranial nerves are intact.  EKG/LABS/ Recent Cardiac Studies    ECG personally reviewed by me today -none completed today  Risk Assessment/Calculations:    CHA2DS2-VASc Score = 6   This  indicates a 9.7% annual risk of stroke. The patient's score is based upon: CHF History: 1 HTN History: 1 Diabetes History: 0 Stroke History: 0 Vascular Disease History: 1 Age Score: 2 Gender Score: 1  Lab Results  Component Value Date   WBC 4.6 09/26/2022   HGB 12.6 09/26/2022   HCT 38.6 09/26/2022   MCV 96.5 09/26/2022   PLT 180 09/26/2022   Lab Results  Component Value Date   CREATININE 0.92 11/06/2022   BUN 19 11/06/2022   NA 131 (L) 11/06/2022   K 4.8 11/06/2022   CL 93 (L) 11/06/2022   CO2 23 11/06/2022   Lab Results  Component Value Date   ALT 20 09/26/2022   AST 23 09/26/2022   ALKPHOS 80 09/26/2022   BILITOT 0.9 09/26/2022   Lab Results  Component Value Date   CHOL 168 03/06/2022   HDL 98 03/06/2022   LDLCALC 59 03/06/2022   TRIG 52 03/06/2022   CHOLHDL 1.7 03/06/2022    Lab Results  Component Value Date   HGBA1C 5.8 (H) 01/30/2015    Cardiac Studies & Procedures       ECHOCARDIOGRAM  ECHOCARDIOGRAM COMPLETE 10/30/2022  Narrative ECHOCARDIOGRAM REPORT    Patient Name:   LUXE FOMBY Date of Exam: 10/30/2022 Medical Rec #:  [id number]           Height:       65.0 in Accession #:    [id number]          Weight:       235.2 lb Date of Birth:  01/28/1946           BSA:          2.119 m Patient Age:    76 years            BP:           118/60 mmHg Patient Gender: F                   HR:           73 bpm. Exam Location:  [address]  Procedure: 2D Echo, Cardiac Doppler and Color Doppler  Indications:    R06.02 SOB  History:        Patient has prior history of Echocardiogram examinations, most recent 12/09/2019. Arrythmias:PSVT, Signs/Symptoms:Shortness of Breath; Risk Factors:Hypertension, Former Smoker and Obesity.  Sonographer:    Samule Ohm RDCS Referring Phys: [phone] [place], JR Calina Patrie  IMPRESSIONS   1. Left ventricular ejection fraction, by estimation, is 55 to 60%. The left ventricle has normal function. The  left ventricle has no regional wall motion abnormalities. There is mild concentric left ventricular hypertrophy. Left ventricular diastolic parameters are consistent with Grade II diastolic dysfunction (pseudonormalization). 2. Right ventricular systolic function is normal. The right ventricular size is mildly enlarged. There is mildly elevated pulmonary artery systolic pressure. The estimated right ventricular systolic pressure is 39.2 mmHg. 3. Left atrial size was moderately dilated. 4. Right atrial size was moderately dilated. 5. The mitral valve is normal in structure. Mild mitral valve regurgitation. No evidence of mitral stenosis. 6. Tricuspid valve regurgitation is mild to moderate. 7. The aortic valve is tricuspid. There is mild calcification of the aortic valve. Aortic valve regurgitation is not visualized. No aortic stenosis is present. 8. The inferior vena cava is normal in size with greater than 50% respiratory variability, suggesting right atrial pressure of 3 mmHg.  FINDINGS Left Ventricle: Left ventricular ejection fraction, by estimation, is 55 to 60%. The left ventricle has normal function. The left ventricle has no regional wall motion abnormalities. The left ventricular internal cavity size was normal in size. There is mild concentric left ventricular hypertrophy.  Left ventricular diastolic parameters are consistent with Grade II diastolic dysfunction (pseudonormalization).  Right Ventricle: The right ventricular size is mildly enlarged. No increase in right ventricular wall thickness. Right ventricular systolic function is normal. There is mildly elevated pulmonary artery systolic pressure. The tricuspid regurgitant velocity is 3.01 m/s, and with an assumed right atrial pressure of 3 mmHg, the estimated right ventricular systolic pressure is 39.2 mmHg.  Left Atrium: Left atrial size was moderately dilated.  Right Atrium: Right atrial size was moderately dilated.  Pericardium:  There is no evidence of pericardial effusion.  Mitral Valve: The mitral valve is normal in structure. Mild mitral valve regurgitation. No evidence of mitral valve stenosis.  Tricuspid Valve: The tricuspid valve is normal in structure. Tricuspid valve regurgitation is mild to moderate.  Aortic Valve: The aortic valve is tricuspid. There is mild calcification of the aortic valve. Aortic valve regurgitation is not visualized. No aortic stenosis is present.  Pulmonic Valve: The pulmonic valve was normal in structure. Pulmonic valve regurgitation is not visualized.  Aorta: The aortic root is normal in size and structure.  Venous: The inferior vena cava is normal in size with greater than 50% respiratory variability, suggesting right atrial pressure of 3 mmHg.  IAS/Shunts: No atrial level shunt detected by color flow Doppler.   LEFT VENTRICLE PLAX 2D LVIDd:         4.30 cm   Diastology LVIDs:         3.00 cm   LV e' medial:    11.80 cm/s LV PW:         1.30 cm   LV E/e' medial:  10.9 LV IVS:        1.20 cm   LV e' lateral:   17.40 cm/s LVOT diam:     2.00 cm   LV E/e' lateral: 7.4 LV SV:         87 LV SV Index:   41 LVOT Area:     3.14 cm   RIGHT VENTRICLE             IVC RV S prime:     14.50 cm/s  IVC diam: 1.20 cm TAPSE (M-mode): 2.4 cm RVSP:           39.2 mmHg  LEFT ATRIUM             Index        RIGHT ATRIUM           Index LA diam:        4.90 cm 2.31 cm/m   RA Pressure: 3.00 mmHg LA Vol (A2C):   91.0 ml 42.94 ml/m  RA Area:     26.50 cm LA Vol (A4C):   75.5 ml 35.63 ml/m  RA Volume:   97.80 ml  46.15 ml/m LA Biplane Vol: 85.9 ml 40.53 ml/m AORTIC VALVE LVOT Vmax:   118.40 cm/s LVOT Vmean:  78.280 cm/s LVOT VTI:    0.276 m  AORTA Ao Root diam: 3.20 cm Ao Asc diam:  3.30 cm  MITRAL VALVE                TRICUSPID VALVE MV Area (PHT): 3.14 cm     TR Peak grad:   36.2 mmHg MV Decel Time: 241 msec     TR Vmax:        301.00 cm/s MV E velocity: 128.40 cm/s   Estimated RAP:  3.00 mmHg MV A velocity: 51.36 cm/s   RVSP:  39.2 mmHg MV E/A ratio:  2.50 SHUNTS Systemic VTI:  0.28 m Systemic Diam: 2.00 cm  Dalton McleanMD Electronically signed by Wilfred Lacy Signature Date/Time: 10/30/2022/5:19:43 PM    Final    MONITORS  LONG TERM MONITOR (3-14 DAYS) 07/19/2021  Narrative Patch Wear Time:  8 days and 5 hours (2022-09-20T13:21:20-0400 to 2022-09-28T18:49:48-0400)  Predominant rhythm is sinus rhythm   Rates 51 to 104 bpm   Average HR 67 bpm   Frequent SVT, longest lasting 1 min 39 sec with average HR 156 bpm   Fastest HR 7 beats at 210 bpm    Rare PVCs Triggered events correlated with SR with PVC and also SVT   CT SCANS  CT CARDIAC SCORING (SELF PAY ONLY) 07/02/2019  Addendum 07/02/2019 11:55 AM ADDENDUM REPORT: 07/02/2019 11:52  ADDENDUM: OVER-READ INTERPRETATION  CT CHEST  The following report is an over-read performed by radiologist Dr.  Francia Greaves: OVER-READ INTERPRETATION  CT CHEST  The following report is an over-read performed by radiologist Dr. Schuyler Amor [facility] on 07/02/2019. This over-read does not include interpretation of cardiac or coronary anatomy or pathology. The coronary calcium score interpretation by the cardiologist is attached.  COMPARISON:  01/23/2015  FINDINGS: Cardiovascular: Aortic atherosclerosis. Normal heart size. No pericardial effusion.  Mediastinum: No mass or adenopathy identified.  Lungs/pleura: No pleural effusion. No airspace consolidation, atelectasis or pneumothorax.  Upper abdomen: No acute abnormality within the visualized portions of the upper abdomen.  Musculoskeletal: No acute or suspicious osseous abnormality.  IMPRESSION: 1. No acute cardiopulmonary abnormalities. 2.  Aortic Atherosclerosis (ICD10-I70.0).   Electronically Signed By: Signa Kell M.D. On: 07/02/2019 11:52  Narrative CLINICAL DATA:  Risk stratification  EXAM: Coronary  Calcium Score  MEDICATIONS: None  TECHNIQUE: The patient was scanned on a [employer]. Axial non-contrast 3 mm slices were carried out through the heart. The data set was analyzed on a dedicated work station and scored using the Agatson method.  FINDINGS: Non-cardiac: See separate report from [facility].  Ascending Aorta: Normal size, minimal calcifications.  Pericardium: Normal.  Coronary arteries: Normal origin.  Mild mitral annular calcifications.  IMPRESSION: Coronary calcium score of 0. This was 0 percentile for age and sex matched control.  Tobias Alexander  Electronically Signed: By: Tobias Alexander On: 07/02/2019 11:19          Assessment & Plan    1.  Chronic diastolic CHF: -2D echo was completed 10/2022 showing EF of 55 to 60% with no RWMA and mild concentric LVH with grade 2 DD and moderately dilated LA/RA -Patient reported episode of bilateral lower extremity swelling and shortness of breath following consuming high salt foods over Easter weekend -Today patient reports that her swelling has continued -We will have her take Lasix 20 mg x 3 days and then 20 mg as needed.  She will also take 20 mEq of potassium x 3 days and then as needed. -Low sodium diet, fluid restriction <2L, and daily weights encouraged. Educated to contact our office for weight gain of 2 lbs overnight or 5 lbs in one week.   2.  Paroxysmal AF: -Patient is currently on flecainide 75 mg twice daily and metoprolol 25 mg 3 times per day -Today she is sinus rhythm and rate controlled and reports no episodes of tachycardia or palpitations. -She is tolerating Eliquis 5 mg twice daily no complaints of bleeding -CHA2DS2-VASc Score = 6 [CHF History: 1, HTN History: 1, Diabetes History: 0, Stroke History: 0, Vascular Disease History:  1, Age Score: 2, Gender Score: 1].  Therefore, the patient's annual risk of stroke is 9.7 %.     3.  Essential hypertension: -Patient's blood  pressure today is well-controlled at 122/70 -Continue metoprolol 25 mg 3 times daily and amlodipine 2.5 mg twice daily  4.  Abdominal pain: -Patient recently seen in the ED for complaint of abdominal pain.  -CT scan was completed showing no evidence of acute abdominal abnormalities  Disposition: Follow-up with Dietrich PatesPaula Ross, MD or APP as scheduled   Medication Adjustments/Labs and Tests Ordered: Current medicines are reviewed at length with the patient today.  Concerns regarding medicines are outlined above.   Signed, Napoleon Formick Jr, Leodis RainsErnest Henry, NP 01/18/2023, 7:25 AM [facility]  Note:  This document was prepared using Dragon voice recognition software and may include unintentional dictation errors.

## 2023-01-18 NOTE — Patient Instructions (Addendum)
Medication Instructions:  1.Take furosemide (Lasix) 20 mg daily for 3 days, then take as needed thereafter 2.Take potassium 20 meq daily for 3 days, then take as needed when you take the lasix *If you need a refill on your cardiac medications before your next appointment, please call your pharmacy*  Lab Work: None If you have labs (blood work) drawn today and your tests are completely normal, you will receive your results only by: MyChart Message (if you have MyChart) OR A paper copy in the mail If you have any lab test that is abnormal or we need to change your treatment, we will call you to review the results.  Follow-Up: At [facility], you and your health needs are our priority.  As part of our continuing mission to provide you with exceptional heart care, we have created designated Provider Care Teams.  These Care Teams include your primary Cardiologist (physician) and Advanced Practice Providers (APPs -  Physician Assistants and Nurse Practitioners) who all work together to provide you with the care you need, when you need it.  We recommend signing up for the patient portal called "MyChart".  Sign up information is provided on this After Visit Summary.  MyChart is used to connect with patients for Virtual Visits (Telemedicine).  Patients are able to view lab/test results, encounter notes, upcoming appointments, etc.  Non-urgent messages can be sent to your provider as well.   To learn more about what you can do with MyChart, go to [email or web address].    Your next appointment:   02/26/2023 at 1:20 PM  Provider:   Dietrich Pates, MD

## 2023-01-18 NOTE — ED Notes (Signed)
Patient transported to CT 

## 2023-01-18 NOTE — ED Triage Notes (Signed)
Pt c/o constant "very sore/hurt feeling- not sharp" epigastric pain, worse when flat, first noticed Wednesday. No associated NVD Gas-x yesterday & today, "short period" of relief

## 2023-01-20 ENCOUNTER — Encounter: Payer: Self-pay | Admitting: Internal Medicine

## 2023-01-20 NOTE — Progress Notes (Unsigned)
    Subjective:    Patient ID: Amanda Wilkins, female    DOB: [birth year], 77 y.o.   MRN: [id number]      HPI Amanda Wilkins is here for No chief complaint on file.    Pain in back -     Medications and allergies reviewed with patient and updated if appropriate.  Current Outpatient Medications on File Prior to Visit  Medication Sig Dispense Refill   acetaminophen (TYLENOL) 500 MG tablet Take 1,000 mg by mouth daily as needed for moderate pain or mild pain (pain).     ALPRAZolam (XANAX) 0.25 MG tablet Take 1 tablet (0.25 mg total) by mouth 2 (two) times daily as needed for anxiety. 60 tablet 3   amLODipine (NORVASC) 2.5 MG tablet Take 2.5 mg by mouth 2 (two) times daily.     apixaban (ELIQUIS) 5 MG TABS tablet TAKE 1 TABLET(5 MG) BY MOUTH TWICE DAILY 60 tablet 11   cholecalciferol (VITAMIN D3) 25 MCG (1000 UT) tablet Take 1,000 Units by mouth daily.     EPINEPHrine 0.3 mg/0.3 mL IJ SOAJ injection Inject 0.3 mg into the muscle as needed for anaphylaxis. As needed for life-threatening allergic reactions 2 each 1   famotidine (PEPCID) 20 MG tablet Take 1 tablet (20 mg total) by mouth at bedtime. 90 tablet 3   flecainide (TAMBOCOR) 50 MG tablet Take 1.5 tablets (75 mg total) by mouth 2 (two) times daily. 270 tablet 3   furosemide (LASIX) 20 MG tablet Take 1 tablet by mouth daily for 3 days then take as needed thereafter 30 tablet 0   HYDROcodone-acetaminophen (NORCO) 10-325 MG tablet Take 1 tablet by mouth every 6 (six) hours as needed for up to 5 days. 20 tablet 0   hydroxychloroquine (PLAQUENIL) 200 MG tablet Take 400 mg by mouth every morning.     metoprolol tartrate (LOPRESSOR) 25 MG tablet Take 1 tablet (25 mg total) by mouth 3 (three) times daily. CAN TAKE AN ADDITIONAL TAB AS NEEDED FOR PALPITATIONS 90 tablet 3   potassium chloride SA (KLOR-CON M) 20 MEQ tablet Take 1 tablet by mouth daily for 3 days then take as needed therafter 30 tablet 0   Probiotic Product (ALIGN) 4 MG CAPS  Take 4 mg by mouth daily.     rosuvastatin (CRESTOR) 5 MG tablet TAKE 1 TABLET(5 MG) BY MOUTH DAILY 90 tablet 2   sertraline (ZOLOFT) 50 MG tablet TAKE 1 TABLET BY MOUTH DAILY 90 tablet 1   sucralfate (CARAFATE) 1 g tablet Take 1 tablet (1 g total) by mouth 4 (four) times daily -  with meals and at bedtime. 90 tablet 0   No current facility-administered medications on file prior to visit.    Review of Systems     Objective:  There were no vitals filed for this visit. BP Readings from Last 3 Encounters:  01/18/23 122/70  01/18/23 (!) 104/90  01/03/23 118/74   Wt Readings from Last 3 Encounters:  01/18/23 241 lb (109.3 kg)  01/17/23 243 lb (110.2 kg)  01/03/23 242 lb (109.8 kg)   There is no height or weight on file to calculate BMI.    Physical Exam         Assessment & Plan:    See Problem List for Assessment and Plan of chronic medical problems.

## 2023-01-21 ENCOUNTER — Ambulatory Visit (INDEPENDENT_AMBULATORY_CARE_PROVIDER_SITE_OTHER): Payer: Medicare Other | Admitting: Podiatry

## 2023-01-21 ENCOUNTER — Ambulatory Visit (INDEPENDENT_AMBULATORY_CARE_PROVIDER_SITE_OTHER): Payer: Medicare Other | Admitting: Internal Medicine

## 2023-01-21 ENCOUNTER — Encounter: Payer: Self-pay | Admitting: Internal Medicine

## 2023-01-21 ENCOUNTER — Encounter: Payer: Self-pay | Admitting: Podiatry

## 2023-01-21 VITALS — BP 126/80 | HR 66 | Temp 98.5°F | Ht 65.0 in | Wt 235.0 lb

## 2023-01-21 DIAGNOSIS — K2900 Acute gastritis without bleeding: Secondary | ICD-10-CM | POA: Diagnosis not present

## 2023-01-21 DIAGNOSIS — M7672 Peroneal tendinitis, left leg: Secondary | ICD-10-CM | POA: Diagnosis not present

## 2023-01-21 DIAGNOSIS — R1013 Epigastric pain: Secondary | ICD-10-CM | POA: Diagnosis not present

## 2023-01-21 MED ORDER — TRIAMCINOLONE ACETONIDE 10 MG/ML IJ SUSP
10.0000 mg | Freq: Once | INTRAMUSCULAR | Status: AC
Start: 2023-01-21 — End: 2023-01-21
  Administered 2023-01-21: 10 mg

## 2023-01-21 NOTE — Patient Instructions (Addendum)
        Medications changes include :   increase the famotidine to 40 mg at night.  Continue the carafate as prescribed.        Return if symptoms worsen or fail to improve.

## 2023-01-22 ENCOUNTER — Telehealth: Payer: Self-pay

## 2023-01-22 NOTE — Progress Notes (Signed)
Subjective:   Patient ID: Amanda Wilkins, female   DOB: 77 y.o.   MRN: [id number]   HPI Patient states doing quite a bit better still have some pain in this 1 area on my left outside of the foot that sore neuro   ROS      Objective:  Physical Exam  Vascular status intact with patient's left peroneal doing better but there is still some pain more around the peroneal tertius over the peroneal longus insertion     Assessment:  Peroneal tendinitis left improving with only 1 area still discomfort in his patient [place]     Plan:  Sterile prep and we did 1 final peroneal tertius injection after explaining risk left 3 mg Kenalog 5 mg Xylocaine and patient is discharged currently will be seen back as needed

## 2023-01-22 NOTE — Telephone Encounter (Signed)
     Patient  visit on 4/5  at [facility]    Have you been able to follow up with your primary care physician? Yes   The patient was or was not able to obtain any needed medicine or equipment. Yes   Are there diet recommendations that you are having difficulty following? Na   Patient expresses understanding of discharge instructions and education provided has no other needs at this time. Yes      Lenard Forth [facility] Guide, [facility] [phone] [address] [place], [place], [place] [address] Phone: [phone] Email: [email or web address]

## 2023-01-22 NOTE — Telephone Encounter (Signed)
Message sent to the pt via My Chart.   

## 2023-02-14 ENCOUNTER — Ambulatory Visit (INDEPENDENT_AMBULATORY_CARE_PROVIDER_SITE_OTHER): Payer: Medicare Other | Admitting: Obstetrics & Gynecology

## 2023-02-14 ENCOUNTER — Encounter: Payer: Self-pay | Admitting: Obstetrics & Gynecology

## 2023-02-14 VITALS — BP 124/84 | HR 73 | Temp 98.1°F

## 2023-02-14 DIAGNOSIS — N9489 Other specified conditions associated with female genital organs and menstrual cycle: Secondary | ICD-10-CM

## 2023-02-14 DIAGNOSIS — R350 Frequency of micturition: Secondary | ICD-10-CM | POA: Diagnosis not present

## 2023-02-14 LAB — URINALYSIS, COMPLETE W/RFL CULTURE
Hyaline Cast: NONE SEEN /LPF
Nitrites, Initial: NEGATIVE
Protein, ur: NEGATIVE

## 2023-02-14 LAB — WET PREP FOR TRICH, YEAST, CLUE

## 2023-02-14 NOTE — Progress Notes (Signed)
    Amanda Wilkins 11-22-1945 [id number]        77 y.o.  G2P2002   RP: Urinary frequency and vulvar burning  HPI: Patient c/o urinary frequency and vulvar burning x return from [place] trip.  Good quantities of urine each time she urinates.  Drinks a lot of water.  Walked a lot during her trip.  No vaginal d/c.  No fever.  BMs normal.   OB History  Gravida Para Term Preterm AB Living  2 2 2     2   SAB IAB Ectopic Multiple Live Births          2    # Outcome Date GA Lbr Len/2nd Weight Sex Delivery Anes PTL Lv  2 Term     M Vag-Spont  N LIV  1 Term     M CS-Unspec  N LIV    Past medical history,surgical history, problem list, medications, allergies, family history and social history were all reviewed and documented in the EPIC chart.   Directed ROS with pertinent positives and negatives documented in the history of present illness/assessment and plan.  Exam:  Vitals:   02/14/23 1632  BP: 124/84  Pulse: 73  Temp: 98.1 F (36.7 C)  TempSrc: Oral  SpO2: 99%   General appearance:  Normal  CVAT Neg bilaterally  Gynecologic exam: Vulva with mild generalized erythema.  Speculum:  Cervix/vagina normal.  Normal secretions.  Wet prep done.  U/A: Yellow clear, Pro Neg, Nit Neg, WBC 0-5, RBC Neg, Bacteria few.  U. Culture pending.  Wet prep:  Neg   Assessment/Plan:  77 y.o. G2P2002   1. Urinary frequency Patient c/o urinary frequency and vulvar burning x return from [place] trip.  Good quantities of urine each time she urinates.  Drinks a lot of water.  Walked a lot during her trip.  No vaginal d/c.  No fever.  BMs normal.U/A minimally perturbed, will wait on U. Culture.  Avoid bladder irritants with caffeine products. - Urinalysis,Complete w/RFL Culture  2. Vulvar burning Wet prep Neg, but vulva irritated with erythema probably d/t a lot of walking during her trip to [place].  Hydroxycortisone 1% cream BID until improvement. - WET PREP FOR TRICH, YEAST,  CLUE  Other orders - Urine Culture - REFLEXIVE URINE CULTURE   Genia Del MD, 4:41 PM 02/14/2023

## 2023-02-15 LAB — URINALYSIS, COMPLETE W/RFL CULTURE
Bilirubin Urine: NEGATIVE
Glucose, UA: NEGATIVE
Hgb urine dipstick: NEGATIVE
Ketones, ur: NEGATIVE
RBC / HPF: NONE SEEN /HPF (ref 0–2)
Specific Gravity, Urine: 1.015 (ref 1.001–1.035)
pH: 7 (ref 5.0–8.0)

## 2023-02-15 LAB — URINE CULTURE
MICRO NUMBER:: 14904926
SPECIMEN QUALITY:: ADEQUATE

## 2023-02-15 LAB — CULTURE INDICATED
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
## 2023-02-18 ENCOUNTER — Other Ambulatory Visit: Payer: Self-pay

## 2023-02-18 ENCOUNTER — Ambulatory Visit (HOSPITAL_BASED_OUTPATIENT_CLINIC_OR_DEPARTMENT_OTHER): Payer: Medicare Other | Attending: Internal Medicine | Admitting: Physical Therapy

## 2023-02-18 ENCOUNTER — Encounter (HOSPITAL_BASED_OUTPATIENT_CLINIC_OR_DEPARTMENT_OTHER): Payer: Self-pay | Admitting: Physical Therapy

## 2023-02-18 DIAGNOSIS — M25671 Stiffness of right ankle, not elsewhere classified: Secondary | ICD-10-CM | POA: Diagnosis not present

## 2023-02-18 DIAGNOSIS — R29898 Other symptoms and signs involving the musculoskeletal system: Secondary | ICD-10-CM | POA: Insufficient documentation

## 2023-02-18 DIAGNOSIS — M6281 Muscle weakness (generalized): Secondary | ICD-10-CM | POA: Insufficient documentation
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
## 2023-02-25 NOTE — Progress Notes (Unsigned)
Cardiology Office Note   Date:  02/26/2023   ID:  Amanda Wilkins, DOB 1946/06/30, MRN [id number]  PCP:  Tresa Garter, MD  Cardiologist:   Dietrich Pates, MD    F/u of PAF     History of Present Illness: Amanda Wilkins is a 77 y.o. female with a history of chest pain HTN, obesity, OSA (on CPAP since 2019), intermittent atrial fib(Flecanide started in 2022, DCCV in 2022  Reverted to afib, flecanide increased to 75 bid), SVT, mild CV dz, HL, aortic atherosclerosis,  Echo in 2021 showed normal LVEF/RVEF  Carotid USN showed mild plaquing    Myovue (2016) showed inferolateral and inferior defect consistent with ischemia  CT calcium score (2020) 0 PFTs normal OSA Pt started on CPAP in 2019  Atrial flutter 2019   Spontaneously converted  CPX done in Aug 2021.  Limitation felt due to body weight  I saw the pt in Jan 2024  Since seen she was in [place]  She returned a few wks ago   While there she had a few spells of heart racing   Improved with metoprolol Breathing is OK   Mild LE edema  Current Meds  Medication Sig   acetaminophen (TYLENOL) 500 MG tablet Take 1,000 mg by mouth daily as needed for moderate pain or mild pain (pain).   ALPRAZolam (XANAX) 0.25 MG tablet Take 1 tablet (0.25 mg total) by mouth 2 (two) times daily as needed for anxiety.   amLODipine (NORVASC) 2.5 MG tablet Take 2.5 mg by mouth 2 (two) times daily.   apixaban (ELIQUIS) 5 MG TABS tablet TAKE 1 TABLET(5 MG) BY MOUTH TWICE DAILY   cholecalciferol (VITAMIN D3) 25 MCG (1000 UT) tablet Take 1,000 Units by mouth daily.   famotidine (PEPCID) 20 MG tablet Take 1 tablet (20 mg total) by mouth at bedtime.   flecainide (TAMBOCOR) 50 MG tablet Take 1.5 tablets (75 mg total) by mouth 2 (two) times daily.   furosemide (LASIX) 20 MG tablet Take 1 tablet by mouth daily for 3 days then take as needed thereafter   hydroxychloroquine (PLAQUENIL) 200 MG tablet Take 400 mg by mouth every morning.   metoprolol tartrate  (LOPRESSOR) 25 MG tablet Take 1 tablet (25 mg total) by mouth 3 (three) times daily. CAN TAKE AN ADDITIONAL TAB AS NEEDED FOR PALPITATIONS   potassium chloride SA (KLOR-CON M) 20 MEQ tablet Take 1 tablet by mouth daily for 3 days then take as needed therafter   Probiotic Product (ALIGN) 4 MG CAPS Take 4 mg by mouth daily.   rosuvastatin (CRESTOR) 5 MG tablet TAKE 1 TABLET(5 MG) BY MOUTH DAILY   sertraline (ZOLOFT) 50 MG tablet TAKE 1 TABLET BY MOUTH DAILY   sucralfate (CARAFATE) 1 g tablet Take 1 tablet (1 g total) by mouth 4 (four) times daily -  with meals and at bedtime.     Allergies:   Epinephrine base, Aspirin, Benadryl [diphenhydramine], Covid-19 ad26 vaccine(janssen), Fish allergy, Hylan g-f 20, Penicillins, Tape, and Wound dressing adhesive   Past Medical History:  Diagnosis Date   Acute bronchitis 09/11/2016   11/17 refractory   Acute cystitis without hematuria 05/17/2015   Acute kidney injury [facility])    Labs today   Acute right ankle pain 09/09/2018   Anticoagulant long-term use    Xarelto   Axillary pain    Bronchitis    Cellulitis    Cholecystitis    Chronic interstitial nephritis    CONJUNCTIVITIS, ACUTE 10/18/2010   Qualifier:  Diagnosis of  By: Plotnikov MD, Georgina Quint    D-dimer, elevated    Depression    Eye inflammation    GERD (gastroesophageal reflux disease)    History of interstitial nephritis 2016   chronic   History of kidney stones    has a small one found on xray   History of nuclear stress test 12/16/2014   Intermediate risk nuclear study w/ medium size moderate severity reversible defect in the basal and mid inferolateral and inferior wall (per dr cardiology note , dr Dietrich Pates did not think this was consistent with ischemia)/  normal LV function and wall motion, ef 75%   History of septic shock 01/22/2015   in setting Group A Strep Cellulits erysipelas/chest wall induration with streptoccocus basterium-- Severe sepsis, DIC, Acute respiratory failure  with pulmonary edema, Acute Kidney failure with chronic interstitial nephritis   Hypertension    Hyponatremia    Migraines    on Zoloft for migraines   Mild carotid artery disease (HCC)    per duplex 08-30-2017 bilateral ICA 1-39%   OA (osteoarthritis) rheumotologist-  dr Zenovia Jordan   both knees,  shoulders, ankles   OSA on CPAP     moderate obstructive sleep apnea with an AHI of 23.4/h and oxygen desaturations as low as 84%.  Now on CPAP at 12 cm H2O.   PAF (paroxysmal atrial fibrillation) [facility]) 2009   cardiologist-- dr Dietrich Pates   Paroxysmal atrial flutter [facility])    a. dx 11/2017.   PSVT (paroxysmal supraventricular tachycardia)    RA (rheumatoid arthritis) (HCC)    Wears contact lenses     Past Surgical History:  Procedure Laterality Date   BREAST BIOPSY Right 05/15/2017   PASH   CARDIOVERSION N/A 08/28/2021   Procedure: CARDIOVERSION;  Surgeon: Pricilla Riffle, MD;  Location: [facility] ENDOSCOPY;  Service: Cardiovascular;  Laterality: N/A;   CESAREAN SECTION  1980   CHOLECYSTECTOMY N/A 11/16/2021   Procedure: LAPAROSCOPIC CHOLECYSTECTOMY;  Surgeon: Axel Filler, MD;  Location: [facility] OR;  Service: General;  Laterality: N/A;   ESOPHAGOGASTRODUODENOSCOPY N/A 02/08/2015   Procedure: ESOPHAGOGASTRODUODENOSCOPY (EGD);  Surgeon: Louis Meckel, MD;  Location: [facility] ENDOSCOPY;  Service: Endoscopy;  Laterality: N/A;   KNEE ARTHROSCOPY W/ MENISCAL REPAIR Left 08/2014    @[facility]    SHOULDER SURGERY Right 04/04/2016   TOTAL HIP ARTHROPLASTY Right 06/19/2022   Procedure: RIGHT TOTAL HIP ARTHROPLASTY ANTERIOR APPROACH;  Surgeon: Kathryne Hitch, MD;  Location: [facility] OR;  Service: Orthopedics;  Laterality: Right;   TOTAL KNEE ARTHROPLASTY Right 09/01/2018   Procedure: RIGHT TOTAL KNEE ARTHROPLASTY;  Surgeon: Dannielle Huh, MD;  Location: [facility] ORS;  Service: Orthopedics;  Laterality: Right;     Social History:  The patient  reports that she has never smoked. She has never used smokeless tobacco. She  reports that she does not currently use alcohol. She reports that she does not use drugs.   Family History:  The patient's family history includes Allergic rhinitis in her sister; Breast cancer (age of onset: 46) in her sister; Lymphoma in her mother; Pancreatic cancer in her brother; Prostate cancer in her father; Urticaria in her sister.    ROS:  Please see the history of present illness. All other systems are reviewed and  Negative to the above problem except as noted.    PHYSICAL EXAM: VS:  BP 126/72   Pulse 82   Ht 5\' 5"  (1.651 m)   Wt 237 lb 12.8 oz (107.9 kg)   SpO2  99%   BMI 39.57 kg/m   GEN: Obese 77 yo  in no acute distress  HEENT: normal  Neck: JVP is normal  Cardiac: RRR; no murmur  NO  LE  edema  Respiratory:  clear to auscultation GI: soft, nontender.  No hepatomegaly    EKG:  EKG is not ordered today.    Echo   Dec 2023     1. Left ventricular ejection fraction, by estimation, is 55 to 60%. The  left ventricle has normal function. The left ventricle has no regional  wall motion abnormalities. There is mild concentric left ventricular  hypertrophy. Left ventricular diastolic  parameters are consistent with Grade II diastolic dysfunction  (pseudonormalization).   2. Right ventricular systolic function is normal. The right ventricular  size is mildly enlarged. There is mildly elevated pulmonary artery  systolic pressure. The estimated right ventricular systolic pressure is  39.2 mmHg.   3. Left atrial size was moderately dilated.   4. Right atrial size was moderately dilated.   5. The mitral valve is normal in structure. Mild mitral valve  regurgitation. No evidence of mitral stenosis.   6. Tricuspid valve regurgitation is mild to moderate.   7. The aortic valve is tricuspid. There is mild calcification of the  aortic valve. Aortic valve regurgitation is not visualized. No aortic  stenosis is present.   8. The inferior vena cava is normal in size with greater  than 50%  respiratory variability, suggesting right atrial pressure of 3 mmHg.  Lipid Panel    Component Value Date/Time   CHOL 168 03/06/2022 1143   TRIG 52 03/06/2022 1143   HDL 98 03/06/2022 1143   CHOLHDL 1.7 03/06/2022 1143   CHOLHDL 2 05/28/2018 0837   VLDL 13.8 05/28/2018 0837   LDLCALC 59 03/06/2022 1143      Wt Readings from Last 3 Encounters:  02/26/23 237 lb 12.8 oz (107.9 kg)  01/21/23 235 lb (106.6 kg)  01/18/23 241 lb (109.3 kg)    Echo: ( Jan 2024) 1. Left ventricular ejection fraction, by estimation, is 55 to 60%. The  left ventricle has normal function. The left ventricle has no regional  wall motion abnormalities. There is mild concentric left ventricular  hypertrophy. Left ventricular diastolic  parameters are consistent with Grade II diastolic dysfunction  (pseudonormalization).   2. Right ventricular systolic function is normal. The right ventricular  size is mildly enlarged. There is mildly elevated pulmonary artery  systolic pressure. The estimated right ventricular systolic pressure is  39.2 mmHg.   3. Left atrial size was moderately dilated.   4. Right atrial size was moderately dilated.   5. The mitral valve is normal in structure. Mild mitral valve  regurgitation. No evidence of mitral stenosis.   6. Tricuspid valve regurgitation is mild to moderate.   7. The aortic valve is tricuspid. There is mild calcification of the  aortic valve. Aortic valve regurgitation is not visualized. No aortic  stenosis is present.   8. The inferior vena cava is normal in size with greater than 50%  respiratory variability, suggesting right atrial pressure of 3 mmHg.     ASSESSMENT AND PLAN  1  PAF   PT remains in SR clinically   Keep on flecanide   Continue Eliquis  2  Dyspnea.  Breathing is OK now   Stay active  3   HTN   BP is well controlled       4  Hx diastolic dysfunction. Volume status  appears OK  on exam  5   Hx CP   Patient is asymptomatic    Ca  score is 0   Pt has mild plaquing of aorta  Keep on Crestor    6 CV dz Mild plaquing    7 HL  Keep on Crestor given atherosclerosis of aorta.    LDL 59  HDL 98   Follow up next winter     Current medicines are reviewed at length with the patient today.  The patient does not have concerns regarding medicines.  Signed, Dietrich Pates, MD  02/26/2023 11:10 PM    [facility] [address] [place], [place], [place]  [address] Phone: [phone]; Fax: [phone]

## 2023-02-26 ENCOUNTER — Encounter: Payer: Self-pay | Admitting: Internal Medicine

## 2023-02-26 ENCOUNTER — Ambulatory Visit: Payer: Medicare Other | Attending: Internal Medicine | Admitting: Internal Medicine

## 2023-02-26 VITALS — BP 126/72 | HR 82 | Ht 65.0 in | Wt 237.8 lb

## 2023-02-26 DIAGNOSIS — Z131 Encounter for screening for diabetes mellitus: Secondary | ICD-10-CM

## 2023-02-26 DIAGNOSIS — I10 Essential (primary) hypertension: Secondary | ICD-10-CM | POA: Insufficient documentation

## 2023-02-26 DIAGNOSIS — I5032 Chronic diastolic (congestive) heart failure: Secondary | ICD-10-CM | POA: Diagnosis not present

## 2023-02-26 DIAGNOSIS — I480 Paroxysmal atrial fibrillation: Secondary | ICD-10-CM | POA: Insufficient documentation

## 2023-02-26 DIAGNOSIS — I4819 Other persistent atrial fibrillation: Secondary | ICD-10-CM

## 2023-02-26 DIAGNOSIS — E785 Hyperlipidemia, unspecified: Secondary | ICD-10-CM

## 2023-02-26 NOTE — Patient Instructions (Signed)
Medication Instructions:   *If you need a refill on your cardiac medications before your next appointment, please call your pharmacy*   Lab Work: TSH, HGBA1C, CBC, NMR  If you have labs (blood work) drawn today and your tests are completely normal, you will receive your results only by: MyChart Message (if you have MyChart) OR A paper copy in the mail If you have any lab test that is abnormal or we need to change your treatment, we will call you to review the results.   Testing/Procedures:    Follow-Up: At [facility], you and your health needs are our priority.  As part of our continuing mission to provide you with exceptional heart care, we have created designated Provider Care Teams.  These Care Teams include your primary Cardiologist (physician) and Advanced Practice Providers (APPs -  Physician Assistants and Nurse Practitioners) who all work together to provide you with the care you need, when you need it.  We recommend signing up for the patient portal called "MyChart".  Sign up information is provided on this After Visit Summary.  MyChart is used to connect with patients for Virtual Visits (Telemedicine).  Patients are able to view lab/test results, encounter notes, upcoming appointments, etc.  Non-urgent messages can be sent to your provider as well.   To learn more about what you can do with MyChart, go to [email or web address].    Your next appointment:   6 month(s)  Provider:   Dietrich Pates, MD     Other Instructions

## 2023-02-27 LAB — NMR, LIPOPROFILE
Cholesterol, Total: 170 mg/dL (ref 100–199)
HDL Particle Number: 40.7 umol/L (ref >=30.5)
HDL-C: 93 mg/dL (ref >39)
LDL Particle Number: 728 nmol/L (ref <1000)
LDL Size: 21.1 nm (ref >20.5)
LDL-C (NIH Calc): 62 mg/dL (ref 0–99)
LP-IR Score: <25 (ref <=45)
Small LDL Particle Number: <90 nmol/L (ref <=527)
Triglycerides: 83 mg/dL (ref 0–149)

## 2023-02-27 LAB — CBC
Hematocrit: 34.7 % (ref 34.0–46.6)
Hemoglobin: 11.9 g/dL (ref 11.1–15.9)
MCH: 32.5 pg (ref 26.6–33.0)
MCHC: 34.3 g/dL (ref 31.5–35.7)
MCV: 95 fL (ref 79–97)
Platelets: 180 10*3/uL (ref 150–450)
RBC: 3.66 x10E6/uL — ABNORMAL LOW (ref 3.77–5.28)
RDW: 12.7 % (ref 11.7–15.4)
WBC: 4.9 10*3/uL (ref 3.4–10.8)

## 2023-02-27 LAB — TSH: TSH: 3.3 u[IU]/mL (ref 0.450–4.500)

## 2023-02-27 LAB — HEMOGLOBIN A1C
Est. average glucose Bld gHb Est-mCnc: 114 mg/dL
Hgb A1c MFr Bld: 5.6 % (ref 4.8–5.6)

## 2023-03-01 ENCOUNTER — Ambulatory Visit (HOSPITAL_BASED_OUTPATIENT_CLINIC_OR_DEPARTMENT_OTHER): Payer: Medicare Other | Admitting: Physical Therapy

## 2023-03-01 ENCOUNTER — Encounter (HOSPITAL_BASED_OUTPATIENT_CLINIC_OR_DEPARTMENT_OTHER): Payer: Self-pay | Admitting: Physical Therapy

## 2023-03-01 DIAGNOSIS — M25671 Stiffness of right ankle, not elsewhere classified: Secondary | ICD-10-CM

## 2023-03-01 DIAGNOSIS — M6281 Muscle weakness (generalized): Secondary | ICD-10-CM

## 2023-03-01 DIAGNOSIS — R29898 Other symptoms and signs involving the musculoskeletal system: Secondary | ICD-10-CM | POA: Diagnosis not present
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
## 2023-03-07 ENCOUNTER — Encounter (HOSPITAL_BASED_OUTPATIENT_CLINIC_OR_DEPARTMENT_OTHER): Payer: Self-pay | Admitting: Physical Therapy

## 2023-03-07 ENCOUNTER — Ambulatory Visit (HOSPITAL_BASED_OUTPATIENT_CLINIC_OR_DEPARTMENT_OTHER): Payer: Medicare Other | Admitting: Physical Therapy

## 2023-03-07 DIAGNOSIS — M6281 Muscle weakness (generalized): Secondary | ICD-10-CM | POA: Diagnosis not present

## 2023-03-07 DIAGNOSIS — R29898 Other symptoms and signs involving the musculoskeletal system: Secondary | ICD-10-CM | POA: Diagnosis not present

## 2023-03-07 DIAGNOSIS — M25671 Stiffness of right ankle, not elsewhere classified: Secondary | ICD-10-CM | POA: Diagnosis not present
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
## 2023-03-18 ENCOUNTER — Other Ambulatory Visit: Payer: Self-pay

## 2023-03-18 ENCOUNTER — Ambulatory Visit (INDEPENDENT_AMBULATORY_CARE_PROVIDER_SITE_OTHER): Payer: Medicare Other | Admitting: Orthopaedic Surgery

## 2023-03-18 ENCOUNTER — Encounter: Payer: Self-pay | Admitting: Orthopaedic Surgery

## 2023-03-18 ENCOUNTER — Other Ambulatory Visit (INDEPENDENT_AMBULATORY_CARE_PROVIDER_SITE_OTHER): Payer: Medicare Other

## 2023-03-18 VITALS — Ht 65.0 in | Wt 242.0 lb

## 2023-03-18 DIAGNOSIS — M25572 Pain in left ankle and joints of left foot: Secondary | ICD-10-CM

## 2023-03-18 DIAGNOSIS — M1712 Unilateral primary osteoarthritis, left knee: Secondary | ICD-10-CM

## 2023-03-18 DIAGNOSIS — M79672 Pain in left foot: Secondary | ICD-10-CM

## 2023-03-18 NOTE — Progress Notes (Signed)
The patient is someone well-known to [place].  We replaced her right hip last year.  She does come in with left knee pain.  She has a history of remote live a right knee replacement done by 1 my colleagues in town.  She is also being followed by podiatry for her right foot.  She has been experiencing left foot pain.  She says therapy has been working on strengthening her right foot and ankle.  She denies any specific injury to her right knee and says it really does not hurt bad but is throwing her balance off and her knee itself is very weak.  2 views of the left knee show tricompartment arthritis with bone-on-bone wear the medial compartment and patellofemoral joint and osteophytes in all 3 compartments.  The knee feels Alan Mulder is a stable but there is some varus malalignment on exam with patellofemoral crepitation throughout the range of motion.  She does have weakness in her foot and ankle on the left side as well.  I do feel that she would benefit from outpatient physical therapy to work on any modalities that can help strengthen the left knee as well as left foot and ankle.  I did advocate weight loss for her as well and she understands this.  She is agreeable to trying physical therapy on her left knee as well as her left foot and ankle and continue therapy on her right foot and ankle.  We will see her back in 6 weeks after course of therapy.  I would like a repeat weight and BMI calculation at that visit as well.

## 2023-03-20 DIAGNOSIS — Z79899 Other long term (current) drug therapy: Secondary | ICD-10-CM | POA: Diagnosis not present

## 2023-03-27 ENCOUNTER — Ambulatory Visit (HOSPITAL_BASED_OUTPATIENT_CLINIC_OR_DEPARTMENT_OTHER): Payer: Medicare Other | Attending: Internal Medicine | Admitting: Physical Therapy

## 2023-03-27 DIAGNOSIS — M25562 Pain in left knee: Secondary | ICD-10-CM | POA: Diagnosis not present

## 2023-03-27 DIAGNOSIS — M6281 Muscle weakness (generalized): Secondary | ICD-10-CM | POA: Insufficient documentation

## 2023-03-27 DIAGNOSIS — M25671 Stiffness of right ankle, not elsewhere classified: Secondary | ICD-10-CM | POA: Diagnosis not present

## 2023-03-27 DIAGNOSIS — M79672 Pain in left foot: Secondary | ICD-10-CM | POA: Diagnosis not present

## 2023-03-27 DIAGNOSIS — M25662 Stiffness of left knee, not elsewhere classified: Secondary | ICD-10-CM | POA: Diagnosis not present

## 2023-03-27 DIAGNOSIS — M25572 Pain in left ankle and joints of left foot: Secondary | ICD-10-CM | POA: Diagnosis not present

## 2023-03-27 DIAGNOSIS — M1712 Unilateral primary osteoarthritis, left knee: Secondary | ICD-10-CM | POA: Insufficient documentation
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
## 2023-03-28 ENCOUNTER — Encounter (HOSPITAL_BASED_OUTPATIENT_CLINIC_OR_DEPARTMENT_OTHER): Payer: Self-pay | Admitting: Physical Therapy

## 2023-04-02 ENCOUNTER — Ambulatory Visit (HOSPITAL_BASED_OUTPATIENT_CLINIC_OR_DEPARTMENT_OTHER): Payer: Medicare Other

## 2023-04-02 ENCOUNTER — Encounter (HOSPITAL_BASED_OUTPATIENT_CLINIC_OR_DEPARTMENT_OTHER): Payer: Self-pay

## 2023-04-02 DIAGNOSIS — M25572 Pain in left ankle and joints of left foot: Secondary | ICD-10-CM

## 2023-04-02 DIAGNOSIS — M1712 Unilateral primary osteoarthritis, left knee: Secondary | ICD-10-CM | POA: Diagnosis not present

## 2023-04-02 DIAGNOSIS — M6281 Muscle weakness (generalized): Secondary | ICD-10-CM | POA: Diagnosis not present

## 2023-04-02 DIAGNOSIS — M25671 Stiffness of right ankle, not elsewhere classified: Secondary | ICD-10-CM | POA: Diagnosis not present

## 2023-04-02 DIAGNOSIS — M25562 Pain in left knee: Secondary | ICD-10-CM

## 2023-04-02 DIAGNOSIS — M25662 Stiffness of left knee, not elsewhere classified: Secondary | ICD-10-CM
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
## 2023-04-09 ENCOUNTER — Telehealth: Payer: Self-pay | Admitting: *Deleted

## 2023-04-09 ENCOUNTER — Ambulatory Visit (HOSPITAL_BASED_OUTPATIENT_CLINIC_OR_DEPARTMENT_OTHER): Payer: Medicare Other | Admitting: Physical Therapy

## 2023-04-09 DIAGNOSIS — M25562 Pain in left knee: Secondary | ICD-10-CM | POA: Diagnosis not present

## 2023-04-09 DIAGNOSIS — M6281 Muscle weakness (generalized): Secondary | ICD-10-CM

## 2023-04-09 DIAGNOSIS — I10 Essential (primary) hypertension: Secondary | ICD-10-CM

## 2023-04-09 DIAGNOSIS — M1712 Unilateral primary osteoarthritis, left knee: Secondary | ICD-10-CM | POA: Diagnosis not present

## 2023-04-09 DIAGNOSIS — M25662 Stiffness of left knee, not elsewhere classified: Secondary | ICD-10-CM | POA: Diagnosis not present

## 2023-04-09 DIAGNOSIS — I5032 Chronic diastolic (congestive) heart failure: Secondary | ICD-10-CM

## 2023-04-09 DIAGNOSIS — M25671 Stiffness of right ankle, not elsewhere classified: Secondary | ICD-10-CM

## 2023-04-09 DIAGNOSIS — G4733 Obstructive sleep apnea (adult) (pediatric): Secondary | ICD-10-CM

## 2023-04-09 DIAGNOSIS — M25572 Pain in left ankle and joints of left foot: Secondary | ICD-10-CM | POA: Diagnosis not present
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
## 2023-04-09 NOTE — Telephone Encounter (Signed)
Fax came from [facility] asking for  a completed,dated and signed Rx with pressure settings for a new pap device

## 2023-04-10 ENCOUNTER — Encounter (HOSPITAL_BASED_OUTPATIENT_CLINIC_OR_DEPARTMENT_OTHER): Payer: Self-pay | Admitting: Physical Therapy

## 2023-04-11 ENCOUNTER — Encounter: Payer: Self-pay | Admitting: Podiatry

## 2023-04-11 ENCOUNTER — Ambulatory Visit (INDEPENDENT_AMBULATORY_CARE_PROVIDER_SITE_OTHER): Payer: Medicare Other | Admitting: Podiatry

## 2023-04-11 ENCOUNTER — Ambulatory Visit (INDEPENDENT_AMBULATORY_CARE_PROVIDER_SITE_OTHER): Payer: Medicare Other

## 2023-04-11 ENCOUNTER — Other Ambulatory Visit: Payer: Self-pay | Admitting: Podiatry

## 2023-04-11 DIAGNOSIS — T148XXA Other injury of unspecified body region, initial encounter: Secondary | ICD-10-CM

## 2023-04-11 DIAGNOSIS — R609 Edema, unspecified: Secondary | ICD-10-CM

## 2023-04-11 DIAGNOSIS — M7672 Peroneal tendinitis, left leg: Secondary | ICD-10-CM

## 2023-04-11 NOTE — Progress Notes (Signed)
Subjective:   Patient ID: Amanda Wilkins, female   DOB: 77 y.o.   MRN: [id number]   HPI Patient states she still having a lot of pain in her left foot and the medication is not working   ROS      Objective:  Physical Exam  Neurovascular status intact with patient found to have a lot of discomfort lateral side left foot around the peroneal proximal and around the tertius group with no indication of active tendon dysfunction tear     Assessment:  Possibility for interstitial tear versus inflammatory process with no chance of rest for the left lateral foot     Plan:  H&P reviewed at this point I am immobilizing with air fracture walker ice therapy and I gave instructions for reduced activity.  Air fracture walker properly fitted to her lower leg in order to take all pressure off the lateral side and if it does not repeat improved will require MRI and possibly could require surgery in future

## 2023-04-12 NOTE — Telephone Encounter (Signed)
Order placed to [facility] via community message for Airsense CPAP therapy on 12 cm H2O with a Medium size Resmed Full Face Mask AirFit F20 mask and heated humidification.

## 2023-04-16 ENCOUNTER — Ambulatory Visit (HOSPITAL_BASED_OUTPATIENT_CLINIC_OR_DEPARTMENT_OTHER): Payer: Medicare Other | Attending: Internal Medicine | Admitting: Physical Therapy

## 2023-04-16 ENCOUNTER — Telehealth: Payer: Self-pay | Admitting: Internal Medicine

## 2023-04-16 DIAGNOSIS — M25562 Pain in left knee: Secondary | ICD-10-CM | POA: Diagnosis not present

## 2023-04-16 DIAGNOSIS — I4819 Other persistent atrial fibrillation: Secondary | ICD-10-CM

## 2023-04-16 DIAGNOSIS — M25671 Stiffness of right ankle, not elsewhere classified: Secondary | ICD-10-CM | POA: Insufficient documentation

## 2023-04-16 DIAGNOSIS — R609 Edema, unspecified: Secondary | ICD-10-CM

## 2023-04-16 DIAGNOSIS — M25662 Stiffness of left knee, not elsewhere classified: Secondary | ICD-10-CM | POA: Diagnosis not present

## 2023-04-16 DIAGNOSIS — I10 Essential (primary) hypertension: Secondary | ICD-10-CM

## 2023-04-16 DIAGNOSIS — Z131 Encounter for screening for diabetes mellitus: Secondary | ICD-10-CM

## 2023-04-16 DIAGNOSIS — M6281 Muscle weakness (generalized): Secondary | ICD-10-CM | POA: Insufficient documentation

## 2023-04-16 DIAGNOSIS — M25572 Pain in left ankle and joints of left foot: Secondary | ICD-10-CM | POA: Insufficient documentation

## 2023-04-16 DIAGNOSIS — I5032 Chronic diastolic (congestive) heart failure: Secondary | ICD-10-CM

## 2023-04-16 NOTE — Telephone Encounter (Signed)
Pt called to report that she has been having persistent bilateral lower extremity edema up to her knees... she can still wear her normal shoes but they are tight... they do not get better with elevation.   She has not been wearing her compression stockings but will start today during the day only.   She is not SOB and can still lay flat at night.   She had some leftover Lasix 20 that was given to her by Robin Searing PA prior to her trip out of the country for potential dependent edema so for 3 days she had taken Lasix 20 mg which she stopped 2 days ago and has not had any relief. She did not take any of the K that he also gave her.   I will talk with Dr Tenny Craw for her recommendations.

## 2023-04-16 NOTE — Telephone Encounter (Signed)
She can try 40 lasix every other day x 3   With 20 KCL  every other day x 3  Get BMET and BNP after

## 2023-04-16 NOTE — Telephone Encounter (Signed)
Patient called to see if something can be done about her retaining fluid even on fluid medication.
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
## 2023-04-17 ENCOUNTER — Other Ambulatory Visit: Payer: Self-pay | Admitting: Internal Medicine

## 2023-04-17 ENCOUNTER — Encounter (HOSPITAL_BASED_OUTPATIENT_CLINIC_OR_DEPARTMENT_OTHER): Payer: Self-pay | Admitting: Physical Therapy

## 2023-04-17 MED ORDER — POTASSIUM CHLORIDE CRYS ER 20 MEQ PO TBCR: EXTENDED_RELEASE_TABLET | ORAL | 0 refills | Status: DC

## 2023-04-17 MED ORDER — FUROSEMIDE 40 MG PO TABS: ORAL_TABLET | ORAL | 0 refills | Status: DC

## 2023-04-17 NOTE — Telephone Encounter (Signed)
Pt advised Dr Tenny Craw' recommendations and will have labs 04/22/23 prior to her beach trip.

## 2023-04-22 ENCOUNTER — Ambulatory Visit: Payer: Medicare Other | Attending: Internal Medicine

## 2023-04-22 DIAGNOSIS — Z131 Encounter for screening for diabetes mellitus: Secondary | ICD-10-CM | POA: Diagnosis not present

## 2023-04-22 DIAGNOSIS — I10 Essential (primary) hypertension: Secondary | ICD-10-CM | POA: Diagnosis not present

## 2023-04-22 DIAGNOSIS — I4819 Other persistent atrial fibrillation: Secondary | ICD-10-CM

## 2023-04-22 DIAGNOSIS — I5032 Chronic diastolic (congestive) heart failure: Secondary | ICD-10-CM | POA: Diagnosis not present

## 2023-04-22 DIAGNOSIS — R609 Edema, unspecified: Secondary | ICD-10-CM

## 2023-04-22 LAB — BASIC METABOLIC PANEL
BUN/Creatinine Ratio: 23 (ref 12–28)
BUN: 18 mg/dL (ref 8–27)
CO2: 22 mmol/L (ref 20–29)
Calcium: 9.6 mg/dL (ref 8.7–10.3)
Chloride: 98 mmol/L (ref 96–106)
Creatinine, Ser: 0.8 mg/dL (ref 0.57–1.00)
Glucose: 85 mg/dL (ref 70–99)
Potassium: 5.1 mmol/L (ref 3.5–5.2)
Sodium: 135 mmol/L (ref 134–144)
eGFR: 76 mL/min/{1.73_m2} (ref >59)

## 2023-04-23 LAB — PRO B NATRIURETIC PEPTIDE: NT-Pro BNP: 538 pg/mL (ref 0–738)

## 2023-04-24 ENCOUNTER — Telehealth: Payer: Self-pay | Admitting: Internal Medicine

## 2023-04-24 NOTE — Telephone Encounter (Signed)
See other phone note... will close this encounter.  

## 2023-04-24 NOTE — Telephone Encounter (Signed)
Patient was returning call. Please advise ?

## 2023-04-26 ENCOUNTER — Encounter: Payer: Self-pay | Admitting: Internal Medicine

## 2023-04-29 ENCOUNTER — Encounter: Payer: Self-pay | Admitting: Orthopaedic Surgery

## 2023-04-29 ENCOUNTER — Ambulatory Visit (INDEPENDENT_AMBULATORY_CARE_PROVIDER_SITE_OTHER): Payer: Medicare Other | Admitting: Orthopaedic Surgery

## 2023-04-29 ENCOUNTER — Ambulatory Visit (HOSPITAL_BASED_OUTPATIENT_CLINIC_OR_DEPARTMENT_OTHER): Payer: Medicare Other | Admitting: Physical Therapy

## 2023-04-29 ENCOUNTER — Encounter (HOSPITAL_BASED_OUTPATIENT_CLINIC_OR_DEPARTMENT_OTHER): Payer: Self-pay | Admitting: Physical Therapy

## 2023-04-29 VITALS — Ht 65.0 in | Wt 241.0 lb

## 2023-04-29 DIAGNOSIS — M6281 Muscle weakness (generalized): Secondary | ICD-10-CM | POA: Diagnosis not present

## 2023-04-29 DIAGNOSIS — M1712 Unilateral primary osteoarthritis, left knee: Secondary | ICD-10-CM

## 2023-04-29 DIAGNOSIS — M25562 Pain in left knee: Secondary | ICD-10-CM

## 2023-04-29 DIAGNOSIS — M25671 Stiffness of right ankle, not elsewhere classified: Secondary | ICD-10-CM

## 2023-04-29 DIAGNOSIS — M25572 Pain in left ankle and joints of left foot: Secondary | ICD-10-CM

## 2023-04-29 DIAGNOSIS — M25662 Stiffness of left knee, not elsewhere classified: Secondary | ICD-10-CM | POA: Diagnosis not present

## 2023-04-29 NOTE — Telephone Encounter (Signed)
Pt calling back about this same issue. She states she has stopped amlodipine however her legs are still swelling. Please advise.
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
## 2023-04-29 NOTE — Telephone Encounter (Signed)
Patient continued swelling.  Does she need an appt. Or other recommendations?

## 2023-04-29 NOTE — Progress Notes (Signed)
The patient comes in today for further follow-up as a relates to her left knee.  She has been in physical therapy now that is helped her.  She is 77 years old.  She has been dealing with fluid retention and is an active follow-up with cardiologist making adjustments to her medications.  She does take Eliquis so she cannot take anti-inflammatories.  Her BMI today is 40.10.  She does have pitting edema in both of her legs.  Her left knee is improving in terms of less weakness but there is still the thoughts about proceeding with a knee replacement in the future when she has better condition and loses more weight.  She would like to come back and see [place] in 6 weeks for repeat weight and BMI evaluation as well as follow-up after continuing more physical therapy.

## 2023-04-30 ENCOUNTER — Other Ambulatory Visit: Payer: Self-pay

## 2023-04-30 NOTE — Telephone Encounter (Signed)
Set patient up to be seen

## 2023-05-04 ENCOUNTER — Telehealth: Payer: Self-pay | Admitting: Student

## 2023-05-04 NOTE — Telephone Encounter (Addendum)
   The patient called the after-hours line reporting that her blood pressure was elevated this morning and at 169/104 after immediately taking her morning medications. She has recently restarted Amlodipine 2.5 mg daily. She took an extra Lopressor at noon and on recheck, her BP had improved to 129/88. She was encouraged to continue to follow BP at home and report back with readings at the time of her follow-up visit. She will remain on Amlodipine 2.5 mg daily for now as her lower extremity edema previously did not improve with holding this.  Signed, Ellsworth Lennox, PA-C 05/04/2023, 2:11 PM

## 2023-05-06 ENCOUNTER — Telehealth: Payer: Self-pay | Admitting: Internal Medicine

## 2023-05-06 MED ORDER — POTASSIUM CHLORIDE CRYS ER 20 MEQ PO TBCR: EXTENDED_RELEASE_TABLET | ORAL | Status: DC

## 2023-05-06 MED ORDER — FUROSEMIDE 20 MG PO TABS: ORAL_TABLET | ORAL | Status: DC

## 2023-05-06 NOTE — Telephone Encounter (Signed)
Glad to hear her BP is better and that swelling is down  I don't think she needs any lab work   If she keeps taking lasix then would get at [facility]

## 2023-05-06 NOTE — Telephone Encounter (Signed)
I spoke with the pt and she will continue to monitor... she says she has been eating more NA in her diet since she has been going out more and seeing her sons and they have been cooking.   She will start being more cautious and will only take one Lasix if she needs it but if too many days in a row she will call us and we will plan BMET per DR Tenny Craw.

## 2023-05-06 NOTE — Telephone Encounter (Signed)
Pt called to report that she had to call the MD on call for a very high BP 169/104... she ws told to restart the Amlodipine 2.5 mg and although she did not have her readings in from of her she says the Amlodipine has really helped.   She was taken off of it originally due to lower extremity edema but being back on it she has not noticed the difference so she does not think the edema was caused by it.   She had some left over lasix 20 mg so three days ago she took 2 and she noticed a great difference in her swelling.   She is coming back to see Dr Tenny Craw 05/15/23... will forward to Dr Tenny Craw to see if there is anything we need to do prior.

## 2023-05-06 NOTE — Telephone Encounter (Signed)
Patient stated per RN Dewayne Hatch she wants to specifically see Dr. Tenny Craw as was previously suggested.

## 2023-05-08 ENCOUNTER — Ambulatory Visit (HOSPITAL_BASED_OUTPATIENT_CLINIC_OR_DEPARTMENT_OTHER): Payer: Medicare Other | Admitting: Physical Therapy

## 2023-05-08 ENCOUNTER — Encounter (HOSPITAL_BASED_OUTPATIENT_CLINIC_OR_DEPARTMENT_OTHER): Payer: Self-pay

## 2023-05-13 ENCOUNTER — Ambulatory Visit: Payer: Medicare Other | Admitting: Physician Assistant

## 2023-05-13 DIAGNOSIS — X58XXXA Exposure to other specified factors, initial encounter: Secondary | ICD-10-CM | POA: Diagnosis not present

## 2023-05-13 DIAGNOSIS — S20211A Contusion of right front wall of thorax, initial encounter: Secondary | ICD-10-CM | POA: Diagnosis not present

## 2023-05-13 NOTE — Progress Notes (Unsigned)
Cardiology Office Note   Date:  05/15/2023   ID:  Amanda Wilkins, DOB 1946/06/04, MRN [id number]  PCP:  Tresa Garter, MD  Cardiologist:   Dietrich Pates, MD   Pt presents for follow up / assessment of LE edema       History of Present Illness: Amanda Wilkins is a 77 y.o. female with a history of chest pain HTN, obesity, OSA (on CPAP since 2019), intermittent atrial fib(Flecanide started in 2022, DCCV in 2022  Reverted to afib, flecanide increased to 75 bid), SVT, mild CV dz, HL, aortic atherosclerosis,  Echo in 2021 showed normal LVEF/RVEF  Carotid USN showed mild plaquing    Myovue (2016) showed inferolateral and inferior defect consistent with ischemia  CT calcium score (2020) 0 PFTs normal OSA Pt started on CPAP in 2019  Atrial flutter 2019   Spontaneously converted  CPX done in Aug 2021.  Limitation felt due to body weight  I saw the pt in clinic in May 2024    The pt called in earlier this month and said she was having persistent LE edema to knees.   Not SOB    REcomm se try Lasix 20 mg  and get labs   BNP done  NOrmal Since then she messaged back  Still with edema  She denies SOB   NO CP    She does say that her diet has not been as good as it is during the winter   Had family in from [place]  They cooked with more salt than she does    Current Meds  Medication Sig   acetaminophen (TYLENOL) 500 MG tablet Take 1,000 mg by mouth daily as needed for moderate pain or mild pain (pain).   ALPRAZolam (XANAX) 0.25 MG tablet Take 1 tablet (0.25 mg total) by mouth 2 (two) times daily as needed for anxiety.   amLODipine (NORVASC) 2.5 MG tablet Take 2.5 mg by mouth daily.   apixaban (ELIQUIS) 5 MG TABS tablet TAKE 1 TABLET(5 MG) BY MOUTH TWICE DAILY   cholecalciferol (VITAMIN D3) 25 MCG (1000 UT) tablet Take 1,000 Units by mouth daily.   EPINEPHrine 0.3 mg/0.3 mL IJ SOAJ injection Inject 0.3 mg into the muscle as needed for anaphylaxis. As needed for life-threatening  allergic reactions   famotidine (PEPCID) 20 MG tablet Take 1 tablet (20 mg total) by mouth at bedtime.   flecainide (TAMBOCOR) 50 MG tablet Take 1.5 tablets (75 mg total) by mouth 2 (two) times daily.   furosemide (LASIX) 20 MG tablet Take 40 meq by mouth every other day for three doses. Starting 04/17/23. Pt has completed this 3 day dosing and using PRN   hydroxychloroquine (PLAQUENIL) 200 MG tablet Take 400 mg by mouth every morning.   metoprolol tartrate (LOPRESSOR) 25 MG tablet TAKE 1 TABLET BY MOUTH THREE TIMES DAILY CAN TAKE ADDITIONAL TABLET AS NEEDED FOR PALPITATIONS   potassium chloride SA (KLOR-CON M) 20 MEQ tablet Pt to take when she takes her PRN Lasix.   Probiotic Product (ALIGN) 4 MG CAPS Take 4 mg by mouth daily.   rosuvastatin (CRESTOR) 5 MG tablet TAKE 1 TABLET(5 MG) BY MOUTH DAILY   sertraline (ZOLOFT) 50 MG tablet TAKE 1 TABLET BY MOUTH DAILY   sucralfate (CARAFATE) 1 g tablet Take 1 tablet (1 g total) by mouth 4 (four) times daily -  with meals and at bedtime.   [DISCONTINUED] empagliflozin (JARDIANCE) 10 MG TABS tablet Take 1 tablet (10 mg total) by mouth daily  before breakfast.   [DISCONTINUED] empagliflozin (JARDIANCE) 10 MG TABS tablet Take 1 tablet (10 mg total) by mouth daily before breakfast.     Allergies:   Epinephrine base, Aspirin, Benadryl [diphenhydramine], Covid-19 ad26 vaccine(janssen), Fish allergy, Hylan g-f 20, Penicillins, Tape, and Wound dressing adhesive   Past Medical History:  Diagnosis Date   Acute bronchitis 09/11/2016   11/17 refractory   Acute cystitis without hematuria 05/17/2015   Acute kidney injury [facility])    Labs today   Acute right ankle pain 09/09/2018   Anticoagulant long-term use    Xarelto   Axillary pain    Bronchitis    Cellulitis    Cholecystitis    Chronic interstitial nephritis    CONJUNCTIVITIS, ACUTE 10/18/2010   Qualifier: Diagnosis of  By: Plotnikov MD, Georgina Quint    D-dimer, elevated    Depression    Eye inflammation     GERD (gastroesophageal reflux disease)    History of interstitial nephritis 2016   chronic   History of kidney stones    has a small one found on xray   History of nuclear stress test 12/16/2014   Intermediate risk nuclear study w/ medium size moderate severity reversible defect in the basal and mid inferolateral and inferior wall (per dr cardiology note , dr Dietrich Pates did not think this was consistent with ischemia)/  normal LV function and wall motion, ef 75%   History of septic shock 01/22/2015   in setting Group A Strep Cellulits erysipelas/chest wall induration with streptoccocus basterium-- Severe sepsis, DIC, Acute respiratory failure with pulmonary edema, Acute Kidney failure with chronic interstitial nephritis   Hypertension    Hyponatremia    Migraines    on Zoloft for migraines   Mild carotid artery disease (HCC)    per duplex 08-30-2017 bilateral ICA 1-39%   OA (osteoarthritis) rheumotologist-  dr Zenovia Jordan   both knees,  shoulders, ankles   OSA on CPAP     moderate obstructive sleep apnea with an AHI of 23.4/h and oxygen desaturations as low as 84%.  Now on CPAP at 12 cm H2O.   PAF (paroxysmal atrial fibrillation) [facility]) 2009   cardiologist-- dr Dietrich Pates   Paroxysmal atrial flutter [facility])    a. dx 11/2017.   PSVT (paroxysmal supraventricular tachycardia)    RA (rheumatoid arthritis) (HCC)    Wears contact lenses     Past Surgical History:  Procedure Laterality Date   BREAST BIOPSY Right 05/15/2017   PASH   CARDIOVERSION N/A 08/28/2021   Procedure: CARDIOVERSION;  Surgeon: Pricilla Riffle, MD;  Location: [facility] ENDOSCOPY;  Service: Cardiovascular;  Laterality: N/A;   CESAREAN SECTION  1980   CHOLECYSTECTOMY N/A 11/16/2021   Procedure: LAPAROSCOPIC CHOLECYSTECTOMY;  Surgeon: Axel Filler, MD;  Location[facility] OR;  Service: General;  Laterality: N/A;   ESOPHAGOGASTRODUODENOSCOPY N/A 02/08/2015   Procedure: ESOPHAGOGASTRODUODENOSCOPY (EGD);  Surgeon: Louis Meckel, MD;   Location: [facility] ENDOSCOPY;  Service: Endoscopy;  Laterality: N/A;   KNEE ARTHROSCOPY W/ MENISCAL REPAIR Left 08/2014    @[facility]    SHOULDER SURGERY Right 04/04/2016   TOTAL HIP ARTHROPLASTY Right 06/19/2022   Procedure: RIGHT TOTAL HIP ARTHROPLASTY ANTERIOR APPROACH;  Surgeon: Kathryne Hitch, MD;  Location: [facility] OR;  Service: Orthopedics;  Laterality: Right;   TOTAL KNEE ARTHROPLASTY Right 09/01/2018   Procedure: RIGHT TOTAL KNEE ARTHROPLASTY;  Surgeon: Dannielle Huh, MD;  Location: [facility] ORS;  Service: Orthopedics;  Laterality: Right;     Social History:  The patient  reports that she has never smoked. She has never used smokeless tobacco. She reports that she does not currently use alcohol. She reports that she does not use drugs.   Family History:  The patient's family history includes Allergic rhinitis in her sister; Breast cancer (age of onset: 62) in her sister; Lymphoma in her mother; Pancreatic cancer in her brother; Prostate cancer in her father; Urticaria in her sister.    ROS:  Please see the history of present illness. All other systems are reviewed and  Negative to the above problem except as noted.    PHYSICAL EXAM: VS:  BP 124/78   Pulse 68   Ht 5\' 5"  (1.651 m)   Wt 241 lb (109.3 kg)   SpO2 97%   BMI 40.10 kg/m   GEN: Obese 77 yo  in no acute distress  HEENT: normal  Neck: JVP is not elevated  Cardiac: RRR; no murmurs  Trace  LE  edema  Respiratory:  clear to auscultation GI: soft, nontender.  No hepatomegaly    EKG:  EKG is not done today.    Echo   Dec 2023     1. Left ventricular ejection fraction, by estimation, is 55 to 60%. The  left ventricle has normal function. The left ventricle has no regional  wall motion abnormalities. There is mild concentric left ventricular  hypertrophy. Left ventricular diastolic  parameters are consistent with Grade II diastolic dysfunction  (pseudonormalization).   2. Right ventricular systolic function is normal. The right  ventricular  size is mildly enlarged. There is mildly elevated pulmonary artery  systolic pressure. The estimated right ventricular systolic pressure is  39.2 mmHg.   3. Left atrial size was moderately dilated.   4. Right atrial size was moderately dilated.   5. The mitral valve is normal in structure. Mild mitral valve  regurgitation. No evidence of mitral stenosis.   6. Tricuspid valve regurgitation is mild to moderate.   7. The aortic valve is tricuspid. There is mild calcification of the  aortic valve. Aortic valve regurgitation is not visualized. No aortic  stenosis is present.   8. The inferior vena cava is normal in size with greater than 50%  respiratory variability, suggesting right atrial pressure of 3 mmHg.  Lipid Panel    Component Value Date/Time   CHOL 168 03/06/2022 1143   TRIG 52 03/06/2022 1143   HDL 98 03/06/2022 1143   CHOLHDL 1.7 03/06/2022 1143   CHOLHDL 2 05/28/2018 0837   VLDL 13.8 05/28/2018 0837   LDLCALC 59 03/06/2022 1143      Wt Readings from Last 3 Encounters:  05/15/23 241 lb (109.3 kg)  04/29/23 241 lb (109.3 kg)  03/18/23 242 lb (109.8 kg)    Echo: ( Jan 2024) 1. Left ventricular ejection fraction, by estimation, is 55 to 60%. The  left ventricle has normal function. The left ventricle has no regional  wall motion abnormalities. There is mild concentric left ventricular  hypertrophy. Left ventricular diastolic  parameters are consistent with Grade II diastolic dysfunction  (pseudonormalization).   2. Right ventricular systolic function is normal. The right ventricular  size is mildly enlarged. There is mildly elevated pulmonary artery  systolic pressure. The estimated right ventricular systolic pressure is  39.2 mmHg.   3. Left atrial size was moderately dilated.   4. Right atrial size was moderately dilated.   5. The mitral valve is normal in structure. Mild mitral valve  regurgitation. No evidence of mitral stenosis.  6. Tricuspid  valve regurgitation is mild to moderate.   7. The aortic valve is tricuspid. There is mild calcification of the  aortic valve. Aortic valve regurgitation is not visualized. No aortic  stenosis is present.   8. The inferior vena cava is normal in size with greater than 50%  respiratory variability, suggesting right atrial pressure of 3 mmHg.     ASSESSMENT AND PLAN  1  Edema/HFpEF  PT with triv edema now  She says is getting better  She still feels like se is retaining    I discussed in past adding Jardiance   Will try   10 mg   Check labs in 10 days    2 PAF   PT clinically in SR  I am not convinced she is having breakthrough  SHe is usually very symptomatic    Keep on flecanide   Continue Eliquis  2  Dyspnea.  Breathing is OK   3   HTN   BP is well controlled       5   Hx CP   Patient is asymptomatic    Ca score is 0   Pt has mild plaquing of aorta  Continue Crestor    6 CV dz Mild plaquing    7 HL  Keep on Crestor  Follow up this winter     Current medicines are reviewed at length with the patient today.  The patient does not have concerns regarding medicines.  Signed, Dietrich Pates, MD  05/15/2023 10:23 PM    [facility] [address] [place], [place], [place]  [address] Phone: [phone]; Fax: [phone]

## 2023-05-15 ENCOUNTER — Ambulatory Visit: Payer: Medicare Other | Attending: Internal Medicine | Admitting: Internal Medicine

## 2023-05-15 ENCOUNTER — Encounter: Payer: Self-pay | Admitting: Internal Medicine

## 2023-05-15 VITALS — BP 124/78 | HR 68 | Ht 65.0 in | Wt 241.0 lb

## 2023-05-15 DIAGNOSIS — I4819 Other persistent atrial fibrillation: Secondary | ICD-10-CM | POA: Diagnosis not present

## 2023-05-15 DIAGNOSIS — I10 Essential (primary) hypertension: Secondary | ICD-10-CM | POA: Diagnosis not present

## 2023-05-15 DIAGNOSIS — I5032 Chronic diastolic (congestive) heart failure: Secondary | ICD-10-CM | POA: Insufficient documentation

## 2023-05-15 MED ORDER — EMPAGLIFLOZIN 10 MG PO TABS: 10.0000 mg | ORAL_TABLET | Freq: Every day | ORAL | Status: DC

## 2023-05-15 MED ORDER — EMPAGLIFLOZIN 10 MG PO TABS: 10.0000 mg | ORAL_TABLET | Freq: Every day | ORAL | 3 refills | Status: DC

## 2023-05-15 NOTE — Patient Instructions (Signed)
Medication Instructions:  START JARDIANCE 10 MG A DAY  *If you need a refill on your cardiac medications before your next appointment, please call your pharmacy*   Lab Work: BMET IN 10 DAYS  If you have labs (blood work) drawn today and your tests are completely normal, you will receive your results only by: MyChart Message (if you have MyChart) OR A paper copy in the mail If you have any lab test that is abnormal or we need to change your treatment, we will call you to review the results.   Testing/Procedures:    Follow-Up: At [facility], you and your health needs are our priority.  As part of our continuing mission to provide you with exceptional heart care, we have created designated Provider Care Teams.  These Care Teams include your primary Cardiologist (physician) and Advanced Practice Providers (APPs -  Physician Assistants and Nurse Practitioners) who all work together to provide you with the care you need, when you need it.  We recommend signing up for the patient portal called "MyChart".  Sign up information is provided on this After Visit Summary.  MyChart is used to connect with patients for Virtual Visits (Telemedicine).  Patients are able to view lab/test results, encounter notes, upcoming appointments, etc.  Non-urgent messages can be sent to your provider as well.   To learn more about what you can do with MyChart, go to [email or web address].    Your next appointment:   5 month(s)  Provider:   Dietrich Pates, MD     Other Instructions

## 2023-05-16 ENCOUNTER — Encounter (HOSPITAL_BASED_OUTPATIENT_CLINIC_OR_DEPARTMENT_OTHER): Payer: Self-pay | Admitting: Physical Therapy

## 2023-05-16 ENCOUNTER — Ambulatory Visit (HOSPITAL_BASED_OUTPATIENT_CLINIC_OR_DEPARTMENT_OTHER): Payer: Medicare Other | Attending: Internal Medicine | Admitting: Physical Therapy

## 2023-05-16 DIAGNOSIS — M25572 Pain in left ankle and joints of left foot: Secondary | ICD-10-CM | POA: Diagnosis not present

## 2023-05-16 DIAGNOSIS — M25662 Stiffness of left knee, not elsewhere classified: Secondary | ICD-10-CM | POA: Diagnosis not present

## 2023-05-16 DIAGNOSIS — M6281 Muscle weakness (generalized): Secondary | ICD-10-CM | POA: Diagnosis not present

## 2023-05-16 DIAGNOSIS — M25562 Pain in left knee: Secondary | ICD-10-CM | POA: Insufficient documentation

## 2023-05-16 DIAGNOSIS — M25671 Stiffness of right ankle, not elsewhere classified: Secondary | ICD-10-CM | POA: Insufficient documentation
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
## 2023-05-23 ENCOUNTER — Ambulatory Visit (HOSPITAL_BASED_OUTPATIENT_CLINIC_OR_DEPARTMENT_OTHER): Payer: Medicare Other

## 2023-05-23 ENCOUNTER — Encounter (HOSPITAL_BASED_OUTPATIENT_CLINIC_OR_DEPARTMENT_OTHER): Payer: Self-pay

## 2023-05-23 DIAGNOSIS — M25562 Pain in left knee: Secondary | ICD-10-CM

## 2023-05-23 DIAGNOSIS — M25662 Stiffness of left knee, not elsewhere classified: Secondary | ICD-10-CM

## 2023-05-23 DIAGNOSIS — M25572 Pain in left ankle and joints of left foot: Secondary | ICD-10-CM | POA: Diagnosis not present

## 2023-05-23 DIAGNOSIS — M25671 Stiffness of right ankle, not elsewhere classified: Secondary | ICD-10-CM

## 2023-05-23 DIAGNOSIS — M6281 Muscle weakness (generalized): Secondary | ICD-10-CM | POA: Diagnosis not present
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
## 2023-05-27 ENCOUNTER — Ambulatory Visit: Payer: Medicare Other | Attending: Internal Medicine

## 2023-05-27 DIAGNOSIS — I4819 Other persistent atrial fibrillation: Secondary | ICD-10-CM | POA: Diagnosis not present

## 2023-05-27 DIAGNOSIS — I10 Essential (primary) hypertension: Secondary | ICD-10-CM

## 2023-05-27 DIAGNOSIS — I5032 Chronic diastolic (congestive) heart failure: Secondary | ICD-10-CM

## 2023-05-27 LAB — BASIC METABOLIC PANEL
BUN/Creatinine Ratio: 16 (ref 12–28)
BUN: 16 mg/dL (ref 8–27)
CO2: 22 mmol/L (ref 20–29)
Calcium: 9.8 mg/dL (ref 8.7–10.3)
Chloride: 100 mmol/L (ref 96–106)
Creatinine, Ser: 1.01 mg/dL — ABNORMAL HIGH (ref 0.57–1.00)
Glucose: 87 mg/dL (ref 70–99)
Potassium: 4.9 mmol/L (ref 3.5–5.2)
Sodium: 136 mmol/L (ref 134–144)
eGFR: 58 mL/min/{1.73_m2} — ABNORMAL LOW (ref >59)

## 2023-05-30 ENCOUNTER — Ambulatory Visit (HOSPITAL_BASED_OUTPATIENT_CLINIC_OR_DEPARTMENT_OTHER): Payer: Medicare Other

## 2023-05-30 ENCOUNTER — Encounter (HOSPITAL_BASED_OUTPATIENT_CLINIC_OR_DEPARTMENT_OTHER): Payer: Self-pay

## 2023-05-30 DIAGNOSIS — M25662 Stiffness of left knee, not elsewhere classified: Secondary | ICD-10-CM

## 2023-05-30 DIAGNOSIS — M25562 Pain in left knee: Secondary | ICD-10-CM | POA: Diagnosis not present

## 2023-05-30 DIAGNOSIS — M6281 Muscle weakness (generalized): Secondary | ICD-10-CM

## 2023-05-30 DIAGNOSIS — M25671 Stiffness of right ankle, not elsewhere classified: Secondary | ICD-10-CM | POA: Diagnosis not present

## 2023-05-30 DIAGNOSIS — M25572 Pain in left ankle and joints of left foot: Secondary | ICD-10-CM | POA: Diagnosis not present
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
## 2023-05-31 ENCOUNTER — Ambulatory Visit (INDEPENDENT_AMBULATORY_CARE_PROVIDER_SITE_OTHER): Payer: Medicare Other | Admitting: Internal Medicine

## 2023-05-31 ENCOUNTER — Encounter: Payer: Self-pay | Admitting: Internal Medicine

## 2023-05-31 VITALS — BP 126/78 | HR 86 | Temp 97.6°F | Ht 65.0 in | Wt 240.0 lb

## 2023-05-31 DIAGNOSIS — R0781 Pleurodynia: Secondary | ICD-10-CM | POA: Diagnosis not present

## 2023-05-31 DIAGNOSIS — I10 Essential (primary) hypertension: Secondary | ICD-10-CM

## 2023-05-31 DIAGNOSIS — R739 Hyperglycemia, unspecified: Secondary | ICD-10-CM | POA: Diagnosis not present

## 2023-05-31 NOTE — Progress Notes (Unsigned)
Patient ID: Amanda Wilkins, female   DOB: Jan 17, 1946, 77 y.o.   MRN: [id number]        Chief Complaint: follow up HTN, HLD and hyperglycemia ***       HPI:  Amanda Wilkins is a 77 y.o. female here with c/o        Wt Readings from Last 3 Encounters:  05/31/23 240 lb (108.9 kg)  05/15/23 241 lb (109.3 kg)  04/29/23 241 lb (109.3 kg)   BP Readings from Last 3 Encounters:  05/31/23 126/78  05/15/23 124/78  02/26/23 126/72         Past Medical History:  Diagnosis Date   Acute bronchitis 09/11/2016   11/17 refractory   Acute cystitis without hematuria 05/17/2015   Acute kidney injury [facility])    Labs today   Acute right ankle pain 09/09/2018   Anticoagulant long-term use    Xarelto   Axillary pain    Bronchitis    Cellulitis    Cholecystitis    Chronic interstitial nephritis    CONJUNCTIVITIS, ACUTE 10/18/2010   Qualifier: Diagnosis of  By: Posey Rea MD, Georgina Quint    D-dimer, elevated    Depression    Eye inflammation    GERD (gastroesophageal reflux disease)    History of interstitial nephritis 2016   chronic   History of kidney stones    has a small one found on xray   History of nuclear stress test 12/16/2014   Intermediate risk nuclear study w/ medium size moderate severity reversible defect in the basal and mid inferolateral and inferior wall (per dr cardiology note , dr Dietrich Pates did not think this was consistent with ischemia)/  normal LV function and wall motion, ef 75%   History of septic shock 01/22/2015   in setting Group A Strep Cellulits erysipelas/chest wall induration with streptoccocus basterium-- Severe sepsis, DIC, Acute respiratory failure with pulmonary edema, Acute Kidney failure with chronic interstitial nephritis   Hypertension    Hyponatremia    Migraines    on Zoloft for migraines   Mild carotid artery disease (HCC)    per duplex 08-30-2017 bilateral ICA 1-39%   OA (osteoarthritis) rheumotologist-  dr Zenovia Jordan   both knees,   shoulders, ankles   OSA on CPAP     moderate obstructive sleep apnea with an AHI of 23.4/h and oxygen desaturations as low as 84%.  Now on CPAP at 12 cm H2O.   PAF (paroxysmal atrial fibrillation) [facility]) 2009   cardiologist-- dr Dietrich Pates   Paroxysmal atrial flutter [facility])    a. dx 11/2017.   PSVT (paroxysmal supraventricular tachycardia)    RA (rheumatoid arthritis) (HCC)    Wears contact lenses    Past Surgical History:  Procedure Laterality Date   BREAST BIOPSY Right 05/15/2017   PASH   CARDIOVERSION N/A 08/28/2021   Procedure: CARDIOVERSION;  Surgeon: Pricilla Riffle, MD;  Location: [facility] ENDOSCOPY;  Service: Cardiovascular;  Laterality: N/A;   CESAREAN SECTION  1980   CHOLECYSTECTOMY N/A 11/16/2021   Procedure: LAPAROSCOPIC CHOLECYSTECTOMY;  Surgeon: Axel Filler, MD;  Location: [facility] OR;  Service: General;  Laterality: N/A;   ESOPHAGOGASTRODUODENOSCOPY N/A 02/08/2015   Procedure: ESOPHAGOGASTRODUODENOSCOPY (EGD);  Surgeon: Louis Meckel, MD;  Location: [facility] ENDOSCOPY;  Service: Endoscopy;  Laterality: N/A;   KNEE ARTHROSCOPY W/ MENISCAL REPAIR Left 08/2014    @[facility]    SHOULDER SURGERY Right 04/04/2016   TOTAL HIP ARTHROPLASTY Right 06/19/2022   Procedure: RIGHT TOTAL HIP ARTHROPLASTY ANTERIOR APPROACH;  Surgeon:  Kathryne Hitch, MD;  Location: [facility] OR;  Service: Orthopedics;  Laterality: Right;   TOTAL KNEE ARTHROPLASTY Right 09/01/2018   Procedure: RIGHT TOTAL KNEE ARTHROPLASTY;  Surgeon: Dannielle Huh, MD;  Location: [facility] ORS;  Service: Orthopedics;  Laterality: Right;    reports that she has never smoked. She has never used smokeless tobacco. She reports that she does not currently use alcohol. She reports that she does not use drugs. family history includes Allergic rhinitis in her sister; Breast cancer (age of onset: 5) in her sister; Lymphoma in her mother; Pancreatic cancer in her brother; Prostate cancer in her father; Urticaria in her sister. Allergies  Allergen Reactions    Epinephrine Base Other (See Comments)    Seriously increases heart rate   Aspirin Other (See Comments)    Can take the coated 325 mg. Plain asa 325 mg speeds up the heart.   Benadryl [Diphenhydramine]     Increased heart rate   Covid-19 Ad26 Vaccine(Janssen) Hives     hives 6 hrs after her COVID 19 2nd booster - resolved    Fish Allergy Hives    Was imitation crab meat and broke out in hives, had skin testing for lobster and showed allergic   Hylan G-F 20 Other (See Comments)    Very painful (knee injection)   Penicillins Other (See Comments)    Pt previously has been told not to take because of family history of reactions (water blisters). She took amoxicillin in 2012-2013 with no reaction Pt received ancef on 11-16-2021 without issue   Tape Other (See Comments)    Blisters from adhesive tape  Blisters from adhesive tape, Other reaction(s): Other (See Comments), Blisters from adhesive tape   Wound Dressing Adhesive Other (See Comments)    Blisters from adhesive tape   Current Outpatient Medications on File Prior to Visit  Medication Sig Dispense Refill   acetaminophen (TYLENOL) 500 MG tablet Take 1,000 mg by mouth daily as needed for moderate pain or mild pain (pain).     ALPRAZolam (XANAX) 0.25 MG tablet Take 1 tablet (0.25 mg total) by mouth 2 (two) times daily as needed for anxiety. 60 tablet 3   amLODipine (NORVASC) 2.5 MG tablet Take 2.5 mg by mouth daily.     apixaban (ELIQUIS) 5 MG TABS tablet TAKE 1 TABLET(5 MG) BY MOUTH TWICE DAILY 60 tablet 11   cholecalciferol (VITAMIN D3) 25 MCG (1000 UT) tablet Take 1,000 Units by mouth daily.     empagliflozin (JARDIANCE) 10 MG TABS tablet Take 1 tablet (10 mg total) by mouth daily before breakfast. 90 tablet 3   empagliflozin (JARDIANCE) 10 MG TABS tablet Take 1 tablet (10 mg total) by mouth daily before breakfast.     EPINEPHrine 0.3 mg/0.3 mL IJ SOAJ injection Inject 0.3 mg into the muscle as needed for anaphylaxis. As needed for  life-threatening allergic reactions 2 each 1   famotidine (PEPCID) 20 MG tablet Take 1 tablet (20 mg total) by mouth at bedtime. 90 tablet 3   flecainide (TAMBOCOR) 50 MG tablet Take 1.5 tablets (75 mg total) by mouth 2 (two) times daily. 270 tablet 3   furosemide (LASIX) 20 MG tablet Take 40 meq by mouth every other day for three doses. Starting 04/17/23. Pt has completed this 3 day dosing and using PRN     hydroxychloroquine (PLAQUENIL) 200 MG tablet Take 400 mg by mouth every morning.     metoprolol tartrate (LOPRESSOR) 25 MG tablet TAKE 1 TABLET BY MOUTH THREE  TIMES DAILY CAN TAKE ADDITIONAL TABLET AS NEEDED FOR PALPITATIONS 90 tablet 3   potassium chloride SA (KLOR-CON M) 20 MEQ tablet Pt to take when she takes her PRN Lasix.     Probiotic Product (ALIGN) 4 MG CAPS Take 4 mg by mouth daily.     rosuvastatin (CRESTOR) 5 MG tablet TAKE 1 TABLET(5 MG) BY MOUTH DAILY 90 tablet 2   sertraline (ZOLOFT) 50 MG tablet TAKE 1 TABLET BY MOUTH DAILY 90 tablet 1   sucralfate (CARAFATE) 1 g tablet Take 1 tablet (1 g total) by mouth 4 (four) times daily -  with meals and at bedtime. 90 tablet 0   No current facility-administered medications on file prior to visit.        ROS:  All others reviewed and negative.  Objective        PE:  BP 126/78 (BP Location: Right Arm, Patient Position: Sitting, Cuff Size: Normal)   Pulse 86   Temp 97.6 F (36.4 C) (Oral)   Ht 5\' 5"  (1.651 m)   Wt 240 lb (108.9 kg)   SpO2 98%   BMI 39.94 kg/m                 Constitutional: Pt appears in NAD               HENT: Head: NCAT.                Right Ear: External ear normal.                 Left Ear: External ear normal.                Eyes: . Pupils are equal, round, and reactive to light. Conjunctivae and EOM are normal               Nose: without d/c or deformity               Neck: Neck supple. Gross normal ROM               Cardiovascular: Normal rate and regular rhythm.                 Pulmonary/Chest: Effort  normal and breath sounds without rales or wheezing.                Abd:  Soft, NT, ND, + BS, no organomegaly               Neurological: Pt is alert. At baseline orientation, motor grossly intact               Skin: Skin is warm. No rashes, no other new lesions, LE edema - ***               Psychiatric: Pt behavior is normal without agitation   Micro: none  Cardiac tracings I have personally interpreted today:  none  Pertinent Radiological findings (summarize): none   Lab Results  Component Value Date   WBC 4.9 02/26/2023   HGB 11.9 02/26/2023   HCT 34.7 02/26/2023   PLT 180 02/26/2023   GLUCOSE 87 05/27/2023   CHOL 168 03/06/2022   TRIG 52 03/06/2022   HDL 98 03/06/2022   LDLCALC 59 03/06/2022   ALT 81 (H) 01/18/2023   AST 58 (H) 01/18/2023   NA 136 05/27/2023   K 4.9 05/27/2023   CL 100 05/27/2023   CREATININE 1.01 (H) 05/27/2023   BUN 16 05/27/2023  CO2 22 05/27/2023   TSH 3.300 02/26/2023   INR 1.1 07/07/2022   HGBA1C 5.6 02/26/2023   Assessment/Plan:  Amanda Wilkins is a 77 y.o. White or Caucasian [1] female with  has a past medical history of Acute bronchitis (09/11/2016), Acute cystitis without hematuria (05/17/2015), Acute kidney injury (HCC), Acute right ankle pain (09/09/2018), Anticoagulant long-term use, Axillary pain, Bronchitis, Cellulitis, Cholecystitis, Chronic interstitial nephritis, CONJUNCTIVITIS, ACUTE (10/18/2010), D-dimer, elevated, Depression, Eye inflammation, GERD (gastroesophageal reflux disease), History of interstitial nephritis (2016), History of kidney stones, History of nuclear stress test (12/16/2014), History of septic shock (01/22/2015), Hypertension, Hyponatremia, Migraines, Mild carotid artery disease (HCC), OA (osteoarthritis) (rheumotologist-  dr Zenovia Jordan), OSA on CPAP, PAF (paroxysmal atrial fibrillation) (HCC) (2009), Paroxysmal atrial flutter (HCC), PSVT (paroxysmal supraventricular tachycardia), RA (rheumatoid arthritis) (HCC),  and Wears contact lenses.  No problem-specific Assessment & Plan notes found for this encounter.  Followup: No follow-ups on file.  Oliver Barre, MD 05/31/2023 1:46 PM [facility]  Primary Care - [facility]

## 2023-05-31 NOTE — Patient Instructions (Signed)
Please continue all other medications as before, and refills have been done if requested.  Please have the pharmacy call with any other refills you may need.  Please continue your efforts at being more active, low cholesterol diet, and weight control.  Please keep your appointments with your specialists as you may have planned  You will be contacted regarding the referral for: Physical Therapy at [facility]

## 2023-06-04 ENCOUNTER — Encounter: Payer: Self-pay | Admitting: Internal Medicine

## 2023-06-04 NOTE — Assessment & Plan Note (Signed)
C/w msk strain, for PT referral as per pt reqeust

## 2023-06-04 NOTE — Assessment & Plan Note (Signed)
BP Readings from Last 3 Encounters:  05/31/23 126/78  05/15/23 124/78  02/26/23 126/72   Stable, pt to continue medical treatment norvasc 2.5 qd

## 2023-06-04 NOTE — Assessment & Plan Note (Signed)
Lab Results  Component Value Date   HGBA1C 5.6 02/26/2023   Stable, pt to continue current medical treatment jardiance 10 qd

## 2023-06-05 ENCOUNTER — Ambulatory Visit (HOSPITAL_BASED_OUTPATIENT_CLINIC_OR_DEPARTMENT_OTHER): Payer: Medicare Other | Admitting: Physical Therapy

## 2023-06-05 DIAGNOSIS — M6281 Muscle weakness (generalized): Secondary | ICD-10-CM

## 2023-06-05 DIAGNOSIS — M25662 Stiffness of left knee, not elsewhere classified: Secondary | ICD-10-CM | POA: Diagnosis not present

## 2023-06-05 DIAGNOSIS — M25671 Stiffness of right ankle, not elsewhere classified: Secondary | ICD-10-CM

## 2023-06-05 DIAGNOSIS — M25562 Pain in left knee: Secondary | ICD-10-CM | POA: Diagnosis not present

## 2023-06-05 DIAGNOSIS — M25572 Pain in left ankle and joints of left foot: Secondary | ICD-10-CM
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
## 2023-06-06 ENCOUNTER — Encounter (HOSPITAL_BASED_OUTPATIENT_CLINIC_OR_DEPARTMENT_OTHER): Payer: Self-pay | Admitting: Physical Therapy

## 2023-06-10 ENCOUNTER — Ambulatory Visit: Payer: Medicare Other | Admitting: Orthopaedic Surgery

## 2023-06-11 ENCOUNTER — Telehealth: Payer: Self-pay | Admitting: Internal Medicine

## 2023-06-11 NOTE — Telephone Encounter (Signed)
Pt called in stating she has been getting a high rate and asked to speak to Dewayne Hatch, [place] about it.

## 2023-06-11 NOTE — Telephone Encounter (Signed)
Pt reports she had 2 episodes yesterday, she usually only has one or none. HR no more than 111, lasted 5 minutes or less each...[name] KitchenMarland KitchenMarland Kitchen Aware Dewayne Hatch will follow up with her tomorrow. Pt agreeable to plan.

## 2023-06-12 ENCOUNTER — Ambulatory Visit (HOSPITAL_BASED_OUTPATIENT_CLINIC_OR_DEPARTMENT_OTHER): Payer: Medicare Other | Admitting: Physical Therapy

## 2023-06-13 ENCOUNTER — Other Ambulatory Visit: Payer: Self-pay | Admitting: Internal Medicine

## 2023-06-13 NOTE — Telephone Encounter (Signed)
Pt says that in the last 2 weeks she had 10 episodes of a racing heart beat the highest on her watch 111 and lasting about 5 min each time. No dizziness or SOB and no chest pain... not related to activity.    She has a cold currently but this started before the cold and she is not on any new OTC meds.   The only new med is her Jardiance from 05/15/23 which she feels she has been tolerating.   The next time this happens she will try to do an rhythm strip on her watch and send to [place] on My Chart.

## 2023-06-19 ENCOUNTER — Ambulatory Visit (HOSPITAL_BASED_OUTPATIENT_CLINIC_OR_DEPARTMENT_OTHER): Payer: Medicare Other | Attending: Internal Medicine | Admitting: Physical Therapy

## 2023-06-19 DIAGNOSIS — M25572 Pain in left ankle and joints of left foot: Secondary | ICD-10-CM | POA: Diagnosis not present

## 2023-06-19 DIAGNOSIS — M25662 Stiffness of left knee, not elsewhere classified: Secondary | ICD-10-CM | POA: Insufficient documentation

## 2023-06-19 DIAGNOSIS — M25562 Pain in left knee: Secondary | ICD-10-CM | POA: Diagnosis not present

## 2023-06-19 DIAGNOSIS — M25671 Stiffness of right ankle, not elsewhere classified: Secondary | ICD-10-CM | POA: Diagnosis not present

## 2023-06-19 DIAGNOSIS — M6281 Muscle weakness (generalized): Secondary | ICD-10-CM | POA: Insufficient documentation

## 2023-06-19 DIAGNOSIS — M25612 Stiffness of left shoulder, not elsewhere classified: Secondary | ICD-10-CM | POA: Insufficient documentation

## 2023-06-19 DIAGNOSIS — M25512 Pain in left shoulder: Secondary | ICD-10-CM | POA: Diagnosis not present

## 2023-06-19 DIAGNOSIS — R0781 Pleurodynia: Secondary | ICD-10-CM | POA: Diagnosis not present
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
## 2023-06-20 ENCOUNTER — Ambulatory Visit (INDEPENDENT_AMBULATORY_CARE_PROVIDER_SITE_OTHER): Payer: Medicare Other | Admitting: Orthopaedic Surgery

## 2023-06-20 ENCOUNTER — Encounter (HOSPITAL_BASED_OUTPATIENT_CLINIC_OR_DEPARTMENT_OTHER): Payer: Self-pay | Admitting: Physical Therapy

## 2023-06-20 VITALS — Wt 237.0 lb

## 2023-06-20 DIAGNOSIS — M25562 Pain in left knee: Secondary | ICD-10-CM

## 2023-06-20 DIAGNOSIS — M1712 Unilateral primary osteoarthritis, left knee: Secondary | ICD-10-CM | POA: Diagnosis not present

## 2023-06-20 DIAGNOSIS — G8929 Other chronic pain: Secondary | ICD-10-CM | POA: Diagnosis not present

## 2023-06-20 NOTE — Progress Notes (Signed)
The patient comes in today for continued follow-up as it relates to severe arthritis in her left knee.  She has seen cardiology recently.  They do not need to see her again until the winter.  They have made some changes in her medications.  This is help with peripheral edema.  She has been going to physical therapy for her left knee and that is helped her significantly.  She has known severe bone-on-bone wear of the right knee.  We have replaced her right knee.  Her BMI today is down to 39.44.  She is interested at this point with having her left knee replaced in November.  She has been also having some left shoulder pain and wants to know if therapy can work on her left shoulder.  Examination of her left shoulder definitely shows signs of impingement.  There is some slight weakness in the rotator cuff but there is no blocks to rotation and she is moving it well but when she reaches overhead and across she does have a lot of subacromial outlet pain.  Her left knee does show varus malalignment with a lot of popping and catching in that knee on exam.  There is patellofemoral crepitation as well.  There is varus malalignment that is correctable.  Her right operative knee from a total knee replacement moves really well.  At this point she is interested in proceeding with a right knee replacement in November of this year.  Will work on getting that scheduled.  She will continue on her weight loss journey.  She will also continue formal weeks of physical therapy on her left knee but also will have them work on her left shoulder with any modalities that could help decrease her shoulder pain and improve her left shoulder function.  We will be in touch with scheduling surgery.  She knows to stop Eliquis 3 full days prior to surgery.

## 2023-06-26 ENCOUNTER — Ambulatory Visit (HOSPITAL_BASED_OUTPATIENT_CLINIC_OR_DEPARTMENT_OTHER): Payer: Medicare Other | Admitting: Physical Therapy

## 2023-06-26 DIAGNOSIS — M25662 Stiffness of left knee, not elsewhere classified: Secondary | ICD-10-CM | POA: Diagnosis not present

## 2023-06-26 DIAGNOSIS — M25512 Pain in left shoulder: Secondary | ICD-10-CM | POA: Diagnosis not present

## 2023-06-26 DIAGNOSIS — M25572 Pain in left ankle and joints of left foot: Secondary | ICD-10-CM | POA: Diagnosis not present

## 2023-06-26 DIAGNOSIS — M25671 Stiffness of right ankle, not elsewhere classified: Secondary | ICD-10-CM | POA: Diagnosis not present

## 2023-06-26 DIAGNOSIS — M25562 Pain in left knee: Secondary | ICD-10-CM

## 2023-06-26 DIAGNOSIS — M6281 Muscle weakness (generalized): Secondary | ICD-10-CM

## 2023-06-26 DIAGNOSIS — M25612 Stiffness of left shoulder, not elsewhere classified: Secondary | ICD-10-CM
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
## 2023-06-27 ENCOUNTER — Encounter (HOSPITAL_BASED_OUTPATIENT_CLINIC_OR_DEPARTMENT_OTHER): Payer: Self-pay | Admitting: Physical Therapy

## 2023-06-27 ENCOUNTER — Emergency Department (HOSPITAL_COMMUNITY)
Admission: EM | Admit: 2023-06-27 | Discharge: 2023-06-28 | Disposition: A | Discharge status: Home / Self Care | Payer: Medicare Other | Attending: Emergency Medicine | Admitting: Emergency Medicine

## 2023-06-27 ENCOUNTER — Other Ambulatory Visit: Payer: Self-pay

## 2023-06-27 ENCOUNTER — Encounter (HOSPITAL_COMMUNITY): Payer: Self-pay

## 2023-06-27 ENCOUNTER — Emergency Department (HOSPITAL_COMMUNITY): Payer: Medicare Other

## 2023-06-27 ENCOUNTER — Telehealth: Payer: Self-pay | Admitting: Internal Medicine

## 2023-06-27 DIAGNOSIS — Z7901 Long term (current) use of anticoagulants: Secondary | ICD-10-CM | POA: Insufficient documentation

## 2023-06-27 DIAGNOSIS — R002 Palpitations: Secondary | ICD-10-CM | POA: Diagnosis present

## 2023-06-27 DIAGNOSIS — I4891 Unspecified atrial fibrillation: Secondary | ICD-10-CM | POA: Diagnosis not present

## 2023-06-27 DIAGNOSIS — R079 Chest pain, unspecified: Secondary | ICD-10-CM | POA: Diagnosis not present

## 2023-06-27 LAB — CBC
HCT: 43.3 % (ref 36.0–46.0)
Hemoglobin: 14.3 g/dL (ref 12.0–15.0)
MCH: 32.4 pg (ref 26.0–34.0)
MCHC: 33 g/dL (ref 30.0–36.0)
MCV: 98.2 fL (ref 80.0–100.0)
Platelets: 183 10*3/uL (ref 150–400)
RBC: 4.41 MIL/uL (ref 3.87–5.11)
RDW: 12.4 % (ref 11.5–15.5)
WBC: 5.2 10*3/uL (ref 4.0–10.5)
nRBC: 0 % (ref 0.0–0.2)

## 2023-06-27 LAB — BASIC METABOLIC PANEL
Anion gap: 15 (ref 5–15)
BUN: 17 mg/dL (ref 8–23)
CO2: 22 mmol/L (ref 22–32)
Calcium: 9.7 mg/dL (ref 8.9–10.3)
Chloride: 95 mmol/L — ABNORMAL LOW (ref 98–111)
Creatinine, Ser: 0.84 mg/dL (ref 0.44–1.00)
GFR, Estimated: >60 mL/min (ref >60)
Glucose, Bld: 112 mg/dL — ABNORMAL HIGH (ref 70–99)
Potassium: 4.6 mmol/L (ref 3.5–5.1)
Sodium: 132 mmol/L — ABNORMAL LOW (ref 135–145)

## 2023-06-27 LAB — TROPONIN I (HIGH SENSITIVITY)
Troponin I (High Sensitivity): 6 ng/L (ref <18)
Troponin I (High Sensitivity): 6 ng/L (ref <18)

## 2023-06-27 MED ORDER — SODIUM CHLORIDE 0.9 % IV BOLUS
1000.0000 mL | Freq: Once | INTRAVENOUS | Status: AC
  Administered 2023-06-27: 1000 mL via INTRAVENOUS

## 2023-06-27 MED ORDER — METOPROLOL TARTRATE 5 MG/5ML IV SOLN
5.0000 mg | Freq: Once | INTRAVENOUS | Status: DC
  Filled 2023-06-27: qty 5

## 2023-06-27 NOTE — Telephone Encounter (Signed)
Patient c/o Palpitations:  STAT if patient reporting lightheadedness, shortness of breath, or chest pain  How long have you had palpitations/irregular HR/ Afib? Are you having the symptoms now? {Patient says she is in Afib at this time  Are you currently experiencing lightheadedness, SOB or CP? no  Do you have a history of afib (atrial fibrillation) or irregular heart rhythm?   Have you checked your BP or HR? (document readings if available):   Are you experiencing any other symptoms? Heart rate is flying-  last time she took it, it was 109-she wants to know if she needs go to the hospital?

## 2023-06-27 NOTE — ED Provider Notes (Signed)
5:24 PM Care assumed from Dr. Elayne Snare.  At time of transfer of care, patient awaiting for delta troponin, if it is reassuring, plan of care to discharge home now she is back in sinus rhythm and feeling well.  5:58 PM Second troponin was normal.  Patient be discharged home per previous plan and she agrees with plan.  Patient will be discharged to follow-up with outpatient team.  Clinical Impression: 1. Atrial fibrillation, unspecified type (HCC)     Disposition: Discharge  Condition: Good  I have discussed the results, Dx and Tx plan with the pt(& family if present). He/she/they expressed understanding and agree(s) with the plan. Discharge instructions discussed at great length. Strict return precautions discussed and pt &/or family have verbalized understanding of the instructions. No further questions at time of discharge.    New Prescriptions   No medications on file    Follow Up: Plotnikov, Georgina Quint, MD [address] [place] [address] [phone]  Schedule an appointment as soon as possible for a visit in 1 week      Vern Prestia, Canary Brim, MD 06/27/23 1759

## 2023-06-27 NOTE — Telephone Encounter (Signed)
Per chart patient has signed into the ED.  Sent her a mychart message to confirm and will route to Dr. Tenny Craw to make her aware.

## 2023-06-27 NOTE — Discharge Instructions (Signed)
You were seen in the emergency department for fast A-fib You converted back to a regular rhythm before any interventions here Your blood work including 2 separate draws for cardiac enzymes all looked okay It is importantly follow-up with your doctor regarding today's visit to discuss management of your A-fib Return to the emergency department for repeated episodes of uncontrolled A-fib that did not resolve at home, chest pain, trouble breathing or any other concerns

## 2023-06-27 NOTE — ED Triage Notes (Signed)
Pt c/o feeling heart racing since this am; rate 109-113, took extra metoprolol at home with no change; hx afib; denies cp, denies dizziness, denies sob

## 2023-06-27 NOTE — ED Provider Notes (Signed)
EMERGENCY DEPARTMENT AT [facility] Provider Note   CSN: [id number] Arrival date & time: 06/27/23  1337     History  No chief complaint on file.   Amanda Wilkins is a 77 y.o. female with a past medical history of atrial fibrillation on Eliquis who presents to the ED for palpitations.  Patient first experienced palpitations around 1000 today while at home.  Her Apple Watch also notified her of the tachycardia.  Heart rate has been in the 110s and 120s since that time.  No chest pain, shortness of breath, fevers, chills or recent illness.  She did take an extra dose of her metoprolol at home which did not resolve the tachycardia and decided to come to the ED for evaluation  HPI     Home Medications Prior to Admission medications   Medication Sig Start Date End Date Taking? Authorizing Provider  acetaminophen (TYLENOL) 500 MG tablet Take 1,000 mg by mouth daily as needed for moderate pain or mild pain (pain).    [provider]  ALPRAZolam Prudy Feeler) 0.25 MG tablet Take 1 tablet (0.25 mg total) by mouth 2 (two) times daily as needed for anxiety. 07/18/22   Plotnikov, Georgina Quint, MD  amLODipine (NORVASC) 2.5 MG tablet Take 2.5 mg by mouth daily.    [provider]  apixaban (ELIQUIS) 5 MG TABS tablet TAKE 1 TABLET(5 MG) BY MOUTH TWICE DAILY 11/06/22   Pricilla Riffle, MD  cholecalciferol (VITAMIN D3) 25 MCG (1000 UT) tablet Take 1,000 Units by mouth daily.    [provider]  empagliflozin (JARDIANCE) 10 MG TABS tablet Take 1 tablet (10 mg total) by mouth daily before breakfast. 05/15/23   Pricilla Riffle, MD  empagliflozin (JARDIANCE) 10 MG TABS tablet Take 1 tablet (10 mg total) by mouth daily before breakfast. 05/15/23   Pricilla Riffle, MD  EPINEPHrine 0.3 mg/0.3 mL IJ SOAJ injection Inject 0.3 mg into the muscle as needed for anaphylaxis. As needed for life-threatening allergic reactions 04/02/22   Alfonse Spruce, MD  famotidine (PEPCID) 20  MG tablet Take 1 tablet (20 mg total) by mouth at bedtime. 12/11/22   Plotnikov, Georgina Quint, MD  flecainide (TAMBOCOR) 50 MG tablet Take 1.5 tablets (75 mg total) by mouth 2 (two) times daily. 11/06/22   Pricilla Riffle, MD  furosemide (LASIX) 20 MG tablet Take 40 meq by mouth every other day for three doses. Starting 04/17/23. Pt has completed this 3 day dosing and using PRN 05/06/23   Pricilla Riffle, MD  hydroxychloroquine (PLAQUENIL) 200 MG tablet Take 400 mg by mouth every morning.    [provider]  metoprolol tartrate (LOPRESSOR) 25 MG tablet TAKE 1 TABLET BY MOUTH THREE TIMES DAILY CAN TAKE ADDITIONAL TABLET AS NEEDED FOR PALPITATIONS 04/17/23   Pricilla Riffle, MD  potassium chloride SA (KLOR-CON M) 20 MEQ tablet Pt to take when she takes her PRN Lasix. 05/06/23   Pricilla Riffle, MD  Probiotic Product (ALIGN) 4 MG CAPS Take 4 mg by mouth daily.    [provider]  rosuvastatin (CRESTOR) 5 MG tablet TAKE 1 TABLET(5 MG) BY MOUTH DAILY 06/13/23   Plotnikov, Georgina Quint, MD  sertraline (ZOLOFT) 50 MG tablet TAKE 1 TABLET BY MOUTH DAILY 01/14/23   Plotnikov, Georgina Quint, MD  sucralfate (CARAFATE) 1 g tablet Take 1 tablet (1 g total) by mouth 4 (four) times daily -  with meals and at bedtime. 01/18/23   Derwood Kaplan, MD  Allergies    Epinephrine base, Aspirin, Benadryl [diphenhydramine], Covid-19 ad26 vaccine(janssen), Fish allergy, Hylan g-f 20, Penicillins, Tape, and Wound dressing adhesive    Review of Systems   Review of Systems  Cardiovascular:  Positive for palpitations. Negative for chest pain.  All other systems reviewed and are negative.   Physical Exam Updated Vital Signs BP (!) 126/56   Pulse 65   Temp 97.8 F (36.6 C) (Oral)   Resp 20   SpO2 100%  Physical Exam Vitals and nursing note reviewed.  HENT:     Head: Normocephalic and atraumatic.  Cardiovascular:     Rate and Rhythm: Tachycardia present. Rhythm irregular.  Pulmonary:     Effort: Pulmonary effort is  normal.  Abdominal:     General: There is no distension.  Neurological:     Mental Status: She is alert.  Psychiatric:        Mood and Affect: Mood normal.     ED Results / Procedures / Treatments   Labs (all labs ordered are listed, but only abnormal results are displayed) Labs Reviewed  BASIC METABOLIC PANEL - Abnormal; Notable for the following components:      Result Value   Sodium 132 (*)    Chloride 95 (*)    Glucose, Bld 112 (*)    All other components within normal limits  CBC  TROPONIN I (HIGH SENSITIVITY)  TROPONIN I (HIGH SENSITIVITY)    EKG EKG Interpretation Date/Time:  Thursday June 27 2023 15:38:18 EDT Ventricular Rate:  64 PR Interval:    QRS Duration:  106 QT Interval:  414 QTC Calculation: 428 R Axis:   48  Text Interpretation: Atrial fibrillation Low voltage, precordial leads Confirmed by Estelle June [phone]) on 06/27/2023 5:08:41 PM  Radiology DG Chest 2 View  Result Date: 06/27/2023 CLINICAL DATA:  Chest pain EXAM: CHEST - 2 VIEW COMPARISON:  09/26/2022 FINDINGS: The heart size and mediastinal contours are within normal limits. Both lungs are clear. Disc degenerative disease throughout the thoracic spine. IMPRESSION: No acute abnormality of the lungs. Electronically Signed   By: Jearld Lesch M.D.   On: 06/27/2023 15:42    Procedures Procedures    Medications Ordered in ED Medications  metoprolol tartrate (LOPRESSOR) injection 5 mg (5 mg Intravenous Not Given 06/27/23 1613)  sodium chloride 0.9 % bolus 1,000 mL (1,000 mLs Intravenous Started During Downtime 06/27/23 1620)    ED Course/ Medical Decision Making/ A&P Clinical Course as of 06/27/23 1715  Thu Jun 27, 2023  1711 Initial high-sensitivity troponin of 6.  Will plan for to obtain delta troponin here in the ED.  Remainder of laboratory workup unremarkable overall.  Prior to initiation of IV fluids and IV metoprolol patient rate did slow down to the 60s with apparent normal sinus  rhythm on the monitor.  Will hold off on any intervention at this time and await delta troponin [MP]  1712 Patient remains stable and normal sinus rhythm.  Jackquline Bosch DO, am transitioning care of this patient to the oncoming provider pending delta troponin, reassessment and disposition [MP]    Clinical Course User Index [MP] Royanne Foots, DO                                 Medical Decision Making 77 year old female with history of atrial fibrillation presenting for palpitations beginning this morning.  Unresolved after an extra dose of metoprolol at home.  Hemodynamically stable.  Initial EKG notable for atrial fibrillation with rate in the 110s.  Will obtain laboratory workup to look for potential underlying metabolic disturbance or infection as etiology of refractory A-fib as well as high sensitive troponin although low suspicion for ACS given lack of chest pain.  Will trial IV fluids and IV metoprolol for rate control.  She is anticoagulated on A-fib and has not missed any doses recently  Amount and/or Complexity of Data Reviewed Labs: ordered. Radiology: ordered.  Risk Prescription drug management.           Final Clinical Impression(s) / ED Diagnoses Final diagnoses:  Atrial fibrillation, unspecified type [facility])    Rx / DC Orders ED Discharge Orders     None         Royanne Foots, DO 06/27/23 1715

## 2023-06-28 NOTE — Telephone Encounter (Signed)
Patient called to follow-up with RN Dewayne Hatch regarding Afib symptoms and noted she did have an ED visit.

## 2023-06-28 NOTE — Telephone Encounter (Signed)
Spoke with pt who presented to ED yesterday for Afib.  Pt self converted in the ED and after discharge did experience another episode of Afib for a couple of hours.  Pt is concerned because she has seen an increase in Afib since starting Jardiance.  She would like to know if this medication could possibly be increasing these episodes.  Pt currently in normal rhythm and no additional complainte.  Pt advised will forward to Dr Tenny Craw for her review and recommendation.  Pt verbalizes understanding and thanked Charity fundraiser for the call.

## 2023-07-01 NOTE — Telephone Encounter (Signed)
Pt advised and will let us know how she is doing.

## 2023-07-01 NOTE — Telephone Encounter (Signed)
I agree--  Stop the Jardiance  Follow     Avoid other things that can exacerbate

## 2023-07-01 NOTE — Telephone Encounter (Signed)
I spoke with the pt and advised her that the Jardiance should not be contributing t her AFIB episodes.. it has been on and off.... she says it has been frustrating and makes hr nervous.   She is asking to go off the Jardiance to see if it makes a difference or to possibly wear a monitor to see what exactly hr heart is doing.   I will send to Dr Tenny Craw for her recommendations.   Last monitor 2022.

## 2023-07-01 NOTE — Telephone Encounter (Signed)
Patient is following up requesting a call back with recommendations.

## 2023-07-02 ENCOUNTER — Telehealth: Payer: Self-pay

## 2023-07-02 NOTE — Telephone Encounter (Signed)
Pre-operative Risk Assessment    Patient Name: Amanda Wilkins  DOB: 1946/09/29 MRN: [id number]   LAST OFFICE VISIT: 05/15/23 WITH DR. ROSS NEXT OFFICE VISIT: 09/25/23 WITH TESSA CONTE, PA    Request for Surgical Clearance    Procedure:   LT TOTAL KNEE ARTHROPLASTY  Date of Surgery:  Clearance TBD                                 Surgeon:  DR. Doneen Poisson Surgeon's Group or Practice Name:  [facility] Phone number:  [phone] Fax number:  [phone]   Type of Clearance Requested:   - Pharmacy:  Hold Apixaban (Eliquis) HOLD ELIQUIS 3 DAYS PRIOR TO PROCEDURE    Type of Anesthesia:  Spinal   Additional requests/questions:    SignedMichaelle Copas   07/02/2023, 12:47 PM

## 2023-07-03 ENCOUNTER — Telehealth: Payer: Self-pay | Admitting: *Deleted

## 2023-07-03 NOTE — Telephone Encounter (Signed)
Patient scheduled 09-16-23 for January predicted surgery  by patient to have scheduled per her preference

## 2023-07-03 NOTE — Telephone Encounter (Signed)
  Patient Consent for Virtual Visit      [name] has provided verbal consent on 07/03/2023 for a virtual visit (video or telephone).   CONSENT FOR VIRTUAL VISIT FOR:  Amanda Wilkins  By participating in this virtual visit I agree to the following:  I hereby voluntarily request, consent and authorize [facility] and its employed or contracted physicians, physician assistants, nurse practitioners or other licensed health care professionals (the Practitioner), to provide me with telemedicine health care services (the "Services") as deemed necessary by the treating Practitioner. I acknowledge and consent to receive the Services by the Practitioner via telemedicine. I understand that the telemedicine visit will involve communicating with the Practitioner through live audiovisual communication technology and the disclosure of certain medical information by electronic transmission. I acknowledge that I have been given the opportunity to request an in-person assessment or other available alternative prior to the telemedicine visit and am voluntarily participating in the telemedicine visit.  I understand that I have the right to withhold or withdraw my consent to the use of telemedicine in the course of my care at any time, without affecting my right to future care or treatment, and that the Practitioner or I may terminate the telemedicine visit at any time. I understand that I have the right to inspect all information obtained and/or recorded in the course of the telemedicine visit and may receive copies of available information for a reasonable fee.  I understand that some of the potential risks of receiving the Services via telemedicine include:  Delay or interruption in medical evaluation due to technological equipment failure or disruption; Information transmitted may not be sufficient (e.g. poor resolution of images) to allow for appropriate medical decision making by the  Practitioner; and/or  In rare instances, security protocols could fail, causing a breach of personal health information.  Furthermore, I acknowledge that it is my responsibility to provide information about my medical history, conditions and care that is complete and accurate to the best of my ability. I acknowledge that Practitioner's advice, recommendations, and/or decision may be based on factors not within their control, such as incomplete or inaccurate data provided by me or distortions of diagnostic images or specimens that may result from electronic transmissions. I understand that the practice of medicine is not an exact science and that Practitioner makes no warranties or guarantees regarding treatment outcomes. I acknowledge that a copy of this consent can be made available to me via my patient portal [facility] MyChart), or I can request a printed copy by calling the office of [facility].    I understand that my insurance will be billed for this visit.   I have read or had this consent read to me. I understand the contents of this consent, which adequately explains the benefits and risks of the Services being provided via telemedicine.  I have been provided ample opportunity to ask questions regarding this consent and the Services and have had my questions answered to my satisfaction. I give my informed consent for the services to be provided through the use of telemedicine in my medical care

## 2023-07-03 NOTE — Telephone Encounter (Signed)
Patient with diagnosis of afib on Eliquis for anticoagulation.    Procedure:  LT TOTAL KNEE ARTHROPLASTY  Date of procedure: TBD   CHA2DS2-VASc Score = 5   This indicates a 7.2% annual risk of stroke. The patient's score is based upon: CHF History: 0 HTN History: 1 Diabetes History: 0 Stroke History: 0 Vascular Disease History: 1 Age Score: 2 Gender Score: 1      CrCl 69 ml/min Platelet count 183  Per office protocol, patient can hold Eliquis for 3 days prior to procedure.    **This guidance is not considered finalized until pre-operative APP has relayed final recommendations.**

## 2023-07-03 NOTE — Telephone Encounter (Signed)
Name: Amanda Wilkins  DOB: [birth year]  MRN: [id number]  Primary Cardiologist: Dietrich Pates, MD  Chart reviewed as part of pre-operative protocol coverage. Because of [name] past medical history and time since last visit, she will require a follow-up telephone visit in order to better assess preoperative cardiovascular risk.  Pre-op covering staff: - Please schedule appointment and call patient to inform them. If patient already had an upcoming appointment within acceptable timeframe, please add "pre-op clearance" to the appointment notes so provider is aware. - Please contact requesting surgeon's office via preferred method (i.e, phone, fax) to inform them of need for appointment prior to surgery.  Per office protocol, patient can hold Eliquis for 3 days prior to procedure.  Please resume when medically safe to do so.    Sharlene Dory, PA-C  07/03/2023, 10:15 AM

## 2023-07-11 ENCOUNTER — Encounter (HOSPITAL_BASED_OUTPATIENT_CLINIC_OR_DEPARTMENT_OTHER): Payer: Self-pay

## 2023-07-11 ENCOUNTER — Ambulatory Visit (HOSPITAL_BASED_OUTPATIENT_CLINIC_OR_DEPARTMENT_OTHER): Payer: Medicare Other

## 2023-07-11 DIAGNOSIS — M25671 Stiffness of right ankle, not elsewhere classified: Secondary | ICD-10-CM

## 2023-07-11 DIAGNOSIS — M25512 Pain in left shoulder: Secondary | ICD-10-CM

## 2023-07-11 DIAGNOSIS — M25612 Stiffness of left shoulder, not elsewhere classified: Secondary | ICD-10-CM

## 2023-07-11 DIAGNOSIS — M25572 Pain in left ankle and joints of left foot: Secondary | ICD-10-CM

## 2023-07-11 DIAGNOSIS — M25662 Stiffness of left knee, not elsewhere classified: Secondary | ICD-10-CM | POA: Diagnosis not present

## 2023-07-11 DIAGNOSIS — M6281 Muscle weakness (generalized): Secondary | ICD-10-CM | POA: Diagnosis not present

## 2023-07-11 DIAGNOSIS — M25562 Pain in left knee: Secondary | ICD-10-CM | POA: Diagnosis not present
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
## 2023-07-15 NOTE — Telephone Encounter (Addendum)
Pt says she has had some improvement with her palpitations since stopping the Jardiance but she is still having a few episodes that lasts only seconds but it is a "hard palpitation"... she is still asking to wear a monitor... I will forward for Dr Tenny Craw' review. She will continue to monitor until then.   Will wait until Dr Tenny Craw returns.

## 2023-07-15 NOTE — Telephone Encounter (Signed)
Pt is requesting a callback from nurse Dewayne Hatch regarding her wanting to provide an update since stopping the Jardiance. Please advise

## 2023-07-16 ENCOUNTER — Ambulatory Visit (HOSPITAL_BASED_OUTPATIENT_CLINIC_OR_DEPARTMENT_OTHER): Payer: Medicare Other | Attending: Orthopaedic Surgery | Admitting: Physical Therapy

## 2023-07-16 DIAGNOSIS — M25662 Stiffness of left knee, not elsewhere classified: Secondary | ICD-10-CM | POA: Diagnosis not present

## 2023-07-16 DIAGNOSIS — M25612 Stiffness of left shoulder, not elsewhere classified: Secondary | ICD-10-CM | POA: Diagnosis not present

## 2023-07-16 DIAGNOSIS — M25512 Pain in left shoulder: Secondary | ICD-10-CM | POA: Diagnosis not present

## 2023-07-16 DIAGNOSIS — M25562 Pain in left knee: Secondary | ICD-10-CM | POA: Diagnosis not present

## 2023-07-16 DIAGNOSIS — M6281 Muscle weakness (generalized): Secondary | ICD-10-CM | POA: Diagnosis not present

## 2023-07-16 DIAGNOSIS — M25511 Pain in right shoulder: Secondary | ICD-10-CM | POA: Insufficient documentation
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
## 2023-07-17 ENCOUNTER — Telehealth: Payer: Self-pay | Admitting: Internal Medicine

## 2023-07-17 ENCOUNTER — Encounter (HOSPITAL_BASED_OUTPATIENT_CLINIC_OR_DEPARTMENT_OTHER): Payer: Self-pay | Admitting: Physical Therapy

## 2023-07-17 DIAGNOSIS — I10 Essential (primary) hypertension: Secondary | ICD-10-CM

## 2023-07-17 DIAGNOSIS — I5032 Chronic diastolic (congestive) heart failure: Secondary | ICD-10-CM

## 2023-07-17 DIAGNOSIS — I4819 Other persistent atrial fibrillation: Secondary | ICD-10-CM

## 2023-07-17 NOTE — Telephone Encounter (Signed)
Pt c/o swelling/edema: STAT if pt has developed SOB within 24 hours  If swelling, where is the swelling located?    Both legs  How much weight have you gained and in what time span?   Yes  Have you gained 2 pounds in a day or 5 pounds in a week?   Not sure  Do you have a log of your daily weights (if so, list)?   No  Are you currently taking a fluid pill?   No  Are you currently SOB?   A little heaviness  Have you traveled recently in a car or plane for an extended period of time?   Patient wants a call back regarding the swelling in her legs.  Patient stated she has not been eating salty foods.

## 2023-07-17 NOTE — Telephone Encounter (Signed)
Pt called and very worried that her lower extremities have been swollen the past few days... she says elevation does not improve them and she has been watching her NA intake. She says that the edema goes half way up to her knees. She is not SOB and no worsening palpitations.   She will come in tomorrow for a BMET and PRO BNP.   Dr Tenny Craw will be back by time the results come in... she is asking for lasix since the small RX we gave her a while back has been used up but I asked her to hold off until we see her labs. To also try wearing her compression stockings the day.

## 2023-07-18 ENCOUNTER — Ambulatory Visit: Payer: Medicare Other | Attending: Internal Medicine

## 2023-07-18 DIAGNOSIS — M0609 Rheumatoid arthritis without rheumatoid factor, multiple sites: Secondary | ICD-10-CM | POA: Diagnosis not present

## 2023-07-18 DIAGNOSIS — I4819 Other persistent atrial fibrillation: Secondary | ICD-10-CM | POA: Diagnosis not present

## 2023-07-18 DIAGNOSIS — Z79899 Other long term (current) drug therapy: Secondary | ICD-10-CM | POA: Diagnosis not present

## 2023-07-18 DIAGNOSIS — I5032 Chronic diastolic (congestive) heart failure: Secondary | ICD-10-CM

## 2023-07-18 DIAGNOSIS — I10 Essential (primary) hypertension: Secondary | ICD-10-CM | POA: Diagnosis not present

## 2023-07-18 DIAGNOSIS — M1991 Primary osteoarthritis, unspecified site: Secondary | ICD-10-CM | POA: Diagnosis not present

## 2023-07-18 DIAGNOSIS — E669 Obesity, unspecified: Secondary | ICD-10-CM | POA: Diagnosis not present

## 2023-07-18 DIAGNOSIS — Z6839 Body mass index (BMI) 39.0-39.9, adult: Secondary | ICD-10-CM | POA: Diagnosis not present

## 2023-07-18 LAB — BASIC METABOLIC PANEL
BUN/Creatinine Ratio: 19 (ref 12–28)
BUN: 16 mg/dL (ref 8–27)
CO2: 21 mmol/L (ref 20–29)
Calcium: 9.6 mg/dL (ref 8.7–10.3)
Chloride: 96 mmol/L (ref 96–106)
Creatinine, Ser: 0.84 mg/dL (ref 0.57–1.00)
Glucose: 105 mg/dL — ABNORMAL HIGH (ref 70–99)
Potassium: 4.8 mmol/L (ref 3.5–5.2)
Sodium: 132 mmol/L — ABNORMAL LOW (ref 134–144)
eGFR: 72 mL/min/{1.73_m2} (ref >59)

## 2023-07-18 LAB — PRO B NATRIURETIC PEPTIDE: NT-Pro BNP: 577 pg/mL (ref 0–738)

## 2023-07-19 ENCOUNTER — Encounter: Payer: Self-pay | Admitting: Internal Medicine

## 2023-07-19 DIAGNOSIS — Z79899 Other long term (current) drug therapy: Secondary | ICD-10-CM

## 2023-07-19 MED ORDER — FUROSEMIDE 40 MG PO TABS
40.0000 mg | ORAL_TABLET | Freq: Every day | ORAL | 3 refills | Status: DC
Start: 2023-07-19 — End: 2024-06-19

## 2023-07-19 MED ORDER — POTASSIUM CHLORIDE ER 10 MEQ PO TBCR: EXTENDED_RELEASE_TABLET | ORAL | 3 refills | Status: AC

## 2023-07-19 NOTE — Telephone Encounter (Signed)
Wilkins, Amanda Ping, MD  Lars Mage, RN Lets try Lasix 40 mg a day , Kdur 10 meq tablets ( 2 tablets a day ) Check BMP in 2-3 weeks  Follow up with Dr. Tenny Craw / APP in 6 weeks  PN  Medication sent to pharmacy on file. Repeat BMET entered and scheduled for 08/07/23. F/u appt with Julian Hy previously scheduled in late December-moved up to 08/27/23. Pt made aware via MyChart.

## 2023-07-19 NOTE — Telephone Encounter (Signed)
Patient is asking that her blood work get reviewed today and she get a call back. Please advise

## 2023-07-22 ENCOUNTER — Ambulatory Visit (INDEPENDENT_AMBULATORY_CARE_PROVIDER_SITE_OTHER): Payer: Medicare Other | Admitting: Orthopaedic Surgery

## 2023-07-22 ENCOUNTER — Other Ambulatory Visit (INDEPENDENT_AMBULATORY_CARE_PROVIDER_SITE_OTHER): Payer: Medicare Other

## 2023-07-22 DIAGNOSIS — G8929 Other chronic pain: Secondary | ICD-10-CM | POA: Diagnosis not present

## 2023-07-22 DIAGNOSIS — M25512 Pain in left shoulder: Secondary | ICD-10-CM

## 2023-07-22 MED ORDER — METHYLPREDNISOLONE ACETATE 40 MG/ML IJ SUSP
40.0000 mg | INTRAMUSCULAR | Status: AC | PRN
Start: 2023-07-22 — End: 2023-07-22
  Administered 2023-07-22: 40 mg via INTRA_ARTICULAR

## 2023-07-22 MED ORDER — LIDOCAINE HCL 1 % IJ SOLN
3.0000 mL | INTRAMUSCULAR | Status: AC | PRN
Start: 2023-07-22 — End: 2023-07-22
  Administered 2023-07-22: 3 mL

## 2023-07-22 NOTE — Progress Notes (Signed)
The patient is well-known to me.  We are actually waiting for cardiac clearance in order to schedule her for a left knee replacement.  She is currently going through physical therapy for her left shoulder.  She comes in today requesting steroid injection in her left shoulder.  She is not a diabetic but she is on blood thinning medications.  Examination of her left shoulder shows she has good range of motion of that left shoulder but she does show signs of impingement.  The rotator cuff itself seems strong.  She is now developing some right shoulder pain but that shoulder moves smoothly and the pain is not as significant.  The pain with her left shoulder is waking her up at night.  3 views left shoulder show no acute findings.  The femoral head is well located and in a normal position.  I did place a steroid injection per her request in the left shoulder subacromial outlet which he tolerated well.  I gave her a note to give to physical therapy to have them work on her right shoulder as well.  We will continue to wait for cardiac clearance for her knee replacement.    Procedure Note  Patient: Amanda Wilkins             Date of Birth: 1946/09/20           MRN: [id number]             Visit Date: 07/22/2023  Procedures: Visit Diagnoses:  1. Chronic left shoulder pain     Large Joint Inj: L subacromial bursa on 07/22/2023 9:46 AM Indications: pain and diagnostic evaluation Details: 22 G 1.5 in needle  Arthrogram: No  Medications: 3 mL lidocaine 1 %; 40 mg methylPREDNISolone acetate 40 MG/ML Outcome: tolerated well, no immediate complications Procedure, treatment alternatives, risks and benefits explained, specific risks discussed. Consent was given by the patient. Immediately prior to procedure a time out was called to verify the correct patient, procedure, equipment, support staff and site/side marked as required. Patient was prepped and draped in the usual sterile fashion.

## 2023-07-23 ENCOUNTER — Ambulatory Visit (HOSPITAL_BASED_OUTPATIENT_CLINIC_OR_DEPARTMENT_OTHER): Payer: Medicare Other | Admitting: Physical Therapy

## 2023-07-23 ENCOUNTER — Encounter: Payer: Self-pay | Admitting: Internal Medicine

## 2023-07-23 ENCOUNTER — Encounter (HOSPITAL_BASED_OUTPATIENT_CLINIC_OR_DEPARTMENT_OTHER): Payer: Self-pay | Admitting: Physical Therapy

## 2023-07-23 ENCOUNTER — Telehealth: Payer: Self-pay

## 2023-07-23 DIAGNOSIS — M25511 Pain in right shoulder: Secondary | ICD-10-CM

## 2023-07-23 DIAGNOSIS — M25612 Stiffness of left shoulder, not elsewhere classified: Secondary | ICD-10-CM | POA: Diagnosis not present

## 2023-07-23 DIAGNOSIS — M25562 Pain in left knee: Secondary | ICD-10-CM | POA: Diagnosis not present

## 2023-07-23 DIAGNOSIS — M6281 Muscle weakness (generalized): Secondary | ICD-10-CM

## 2023-07-23 DIAGNOSIS — M25512 Pain in left shoulder: Secondary | ICD-10-CM

## 2023-07-23 DIAGNOSIS — M25662 Stiffness of left knee, not elsewhere classified: Secondary | ICD-10-CM | POA: Diagnosis not present

## 2023-07-23 NOTE — Telephone Encounter (Signed)
My Chart message sent to the pt along with her lab results.

## 2023-07-23 NOTE — Telephone Encounter (Signed)
Call to patient who is requested to be seen for leg swelling. Last OV was 05/15/23 with Dr. Tenny Craw and she endorsed persistent LE edema at that time. She denied SOB. 20 mg Lasix was started at that time. On 07/19/23 Lasix was increased to 40 mg daily via MyChart. Patient today states she has not noticed any improvement in her leg swelling. She is able to participate in physical therapy for her shoulder and go to the gym to workout but states she is slightly SOB when she walks long distances. Patient denies using compression hose due to difficulty of putting them on. Patient requests in person visit as she is concerned that her leg swelling is not getting better. She states her recent weight at home is 247 lb, last weight at 05/15/23 visit was 241 lb. Made DOD appt for 07/31/23.

## 2023-07-23 NOTE — Telephone Encounter (Signed)
If feeling better then I would continue to old [place]. ? Is she wearing monitor?   Continue if is  Otherwise set up for monitor (2 wk)
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
## 2023-07-24 ENCOUNTER — Encounter (HOSPITAL_BASED_OUTPATIENT_CLINIC_OR_DEPARTMENT_OTHER): Payer: Self-pay | Admitting: Physical Therapy

## 2023-07-24 NOTE — Progress Notes (Signed)
Cardiology Office Note   Date:  07/25/2023   ID:  Amanda Wilkins, DOB [birth year], MRN [id number]  PCP:  Tresa Garter, MD  Cardiologist:   Dietrich Pates, MD   Pt presents for follow up  of LE edema       History of Present Illness: Amanda Wilkins is a 77 y.o. female with a history of chest pain HTN, obesity, OSA (on CPAP since 2019), intermittent atrial fib(Flecanide started in 2022, DCCV in 2022  Reverted to afib, flecanide increased to 75 bid), SVT, mild CV dz, HL, aortic atherosclerosis, \ Myovue (2016) showed inferolateral and inferior defect consistent with ischemia  CT calcium score (2020) 0 PFTs normal OSA Pt started on CPAP in 2019  Atrial flutter 2019   Spontaneously converted  CPX done in Aug 2021.  Limitation felt due to body weight  I saw the pt in clinic in July 2024 She has had problems with intermitt LE edema as well as SOB   Does admit to higher salt intake at times     Since seen called in several times in the past couple weeks    Complained of LE edema and SOB      She was instructed on 10/4 to take lasix 40 mg with KCL 2x per day   Complained of edema on 10.8.  Had edema yesterday 10.9   Does admit to sitting more the day prior   Today she has no edema    Says her breathing is OK     Notes rare palpitations  She says when on Jardiance she had a lot of palpitations   Improved with stopping        Allergies:   Epinephrine base, Aspirin, Benadryl [diphenhydramine], Covid-19 ad26 vaccine(janssen), Fish allergy, Hylan g-f 20, Penicillins, Tape, and Wound dressing adhesive   Past Medical History:  Diagnosis Date   Acute bronchitis 09/11/2016   11/17 refractory   Acute cystitis without hematuria 05/17/2015   Acute kidney injury [facility])    Labs today   Acute right ankle pain 09/09/2018   Anticoagulant long-term use    Xarelto   Axillary pain    Bronchitis    Cellulitis    Cholecystitis    Chronic interstitial nephritis    CONJUNCTIVITIS, ACUTE  10/18/2010   Qualifier: Diagnosis of  By: Plotnikov MD, Georgina Quint    D-dimer, elevated    Depression    Eye inflammation    GERD (gastroesophageal reflux disease)    History of interstitial nephritis 2016   chronic   History of kidney stones    has a small one found on xray   History of nuclear stress test 12/16/2014   Intermediate risk nuclear study w/ medium size moderate severity reversible defect in the basal and mid inferolateral and inferior wall (per dr cardiology note , dr Dietrich Pates did not think this was consistent with ischemia)/  normal LV function and wall motion, ef 75%   History of septic shock 01/22/2015   in setting Group A Strep Cellulits erysipelas/chest wall induration with streptoccocus basterium-- Severe sepsis, DIC, Acute respiratory failure with pulmonary edema, Acute Kidney failure with chronic interstitial nephritis   Hypertension    Hyponatremia    Migraines    on Zoloft for migraines   Mild carotid artery disease (HCC)    per duplex 08-30-2017 bilateral ICA 1-39%   OA (osteoarthritis) rheumotologist-  dr Zenovia Jordan   both knees,  shoulders, ankles   OSA on CPAP  moderate obstructive sleep apnea with an AHI of 23.4/h and oxygen desaturations as low as 84%.  Now on CPAP at 12 cm H2O.   PAF (paroxysmal atrial fibrillation) [facility]) 2009   cardiologist-- dr Dietrich Pates   Paroxysmal atrial flutter [facility])    a. dx 11/2017.   PSVT (paroxysmal supraventricular tachycardia) (HCC)    RA (rheumatoid arthritis) (HCC)    Wears contact lenses     Past Surgical History:  Procedure Laterality Date   BREAST BIOPSY Right 05/15/2017   PASH   CARDIOVERSION N/A 08/28/2021   Procedure: CARDIOVERSION;  Surgeon: Pricilla Riffle, MD;  Location: [facility] ENDOSCOPY;  Service: Cardiovascular;  Laterality: N/A;   CESAREAN SECTION  1980   CHOLECYSTECTOMY N/A 11/16/2021   Procedure: LAPAROSCOPIC CHOLECYSTECTOMY;  Surgeon: Axel Filler, MD;  Location: [facility] OR;  Service: General;   Laterality: N/A;   ESOPHAGOGASTRODUODENOSCOPY N/A 02/08/2015   Procedure: ESOPHAGOGASTRODUODENOSCOPY (EGD);  Surgeon: Louis Meckel, MD;  Location: [facility] ENDOSCOPY;  Service: Endoscopy;  Laterality: N/A;   KNEE ARTHROSCOPY W/ MENISCAL REPAIR Left 08/2014    @[facility]    SHOULDER SURGERY Right 04/04/2016   TOTAL HIP ARTHROPLASTY Right 06/19/2022   Procedure: RIGHT TOTAL HIP ARTHROPLASTY ANTERIOR APPROACH;  Surgeon: Kathryne Hitch, MD;  Location: [facility] OR;  Service: Orthopedics;  Laterality: Right;   TOTAL KNEE ARTHROPLASTY Right 09/01/2018   Procedure: RIGHT TOTAL KNEE ARTHROPLASTY;  Surgeon: Dannielle Huh, MD;  Location: [facility] ORS;  Service: Orthopedics;  Laterality: Right;     Social History:  The patient  reports that she has never smoked. She has never used smokeless tobacco. She reports that she does not currently use alcohol. She reports that she does not use drugs.   Family History:  The patient's family history includes Allergic rhinitis in her sister; Breast cancer (age of onset: 14) in her sister; Lymphoma in her mother; Pancreatic cancer in her brother; Prostate cancer in her father; Urticaria in her sister.    ROS:  Please see the history of present illness. All other systems are reviewed and  Negative to the above problem except as noted.    PHYSICAL EXAM: VS:  BP 124/62   Pulse 70   Ht 5\' 5"  (1.651 m)   Wt 240 lb 6.4 oz (109 kg)   SpO2 98%   BMI 40.00 kg/m   GEN: Obese 77 yo  in no acute distress  HEENT: normal  Neck: JVP is normal   Cardiac: RRR; no murmurs  No  LE  edema  Respiratory:  clear to auscultation GI: soft, nontender.  No hepatomegaly    EKG:  EKG is not done today.    Echo   Dec 2023     1. Left ventricular ejection fraction, by estimation, is 55 to 60%. The  left ventricle has normal function. The left ventricle has no regional  wall motion abnormalities. There is mild concentric left ventricular  hypertrophy. Left ventricular diastolic  parameters are  consistent with Grade II diastolic dysfunction  (pseudonormalization).   2. Right ventricular systolic function is normal. The right ventricular  size is mildly enlarged. There is mildly elevated pulmonary artery  systolic pressure. The estimated right ventricular systolic pressure is  39.2 mmHg.   3. Left atrial size was moderately dilated.   4. Right atrial size was moderately dilated.   5. The mitral valve is normal in structure. Mild mitral valve  regurgitation. No evidence of mitral stenosis.   6. Tricuspid valve regurgitation is mild to moderate.  7. The aortic valve is tricuspid. There is mild calcification of the  aortic valve. Aortic valve regurgitation is not visualized. No aortic  stenosis is present.   8. The inferior vena cava is normal in size with greater than 50%  respiratory variability, suggesting right atrial pressure of 3 mmHg.  Lipid Panel    Component Value Date/Time   CHOL 168 03/06/2022 1143   TRIG 52 03/06/2022 1143   HDL 98 03/06/2022 1143   CHOLHDL 1.7 03/06/2022 1143   CHOLHDL 2 05/28/2018 0837   VLDL 13.8 05/28/2018 0837   LDLCALC 59 03/06/2022 1143      Wt Readings from Last 3 Encounters:  07/25/23 240 lb 6.4 oz (109 kg)  06/20/23 237 lb (107.5 kg)  05/31/23 240 lb (108.9 kg)    Echo: ( Jan 2024) 1. Left ventricular ejection fraction, by estimation, is 55 to 60%. The  left ventricle has normal function. The left ventricle has no regional  wall motion abnormalities. There is mild concentric left ventricular  hypertrophy. Left ventricular diastolic  parameters are consistent with Grade II diastolic dysfunction  (pseudonormalization).   2. Right ventricular systolic function is normal. The right ventricular  size is mildly enlarged. There is mildly elevated pulmonary artery  systolic pressure. The estimated right ventricular systolic pressure is  39.2 mmHg.   3. Left atrial size was moderately dilated.   4. Right atrial size was moderately  dilated.   5. The mitral valve is normal in structure. Mild mitral valve  regurgitation. No evidence of mitral stenosis.   6. Tricuspid valve regurgitation is mild to moderate.   7. The aortic valve is tricuspid. There is mild calcification of the  aortic valve. Aortic valve regurgitation is not visualized. No aortic  stenosis is present.   8. The inferior vena cava is normal in size with greater than 50%  respiratory variability, suggesting right atrial pressure of 3 mmHg.     ASSESSMENT AND PLAN  1  Edema/HFpEF  Edema gon   She did take lasix for a couple days with KCL     Cut back on amlodipine to 2.5 daily   Follow  If edema comes back would recomm Lasix and K 2x per week No labs today  2 PAF   PT clinically in [place]  Keep on Flecanide and  Eliquis    2  Dyspnea.  Breathing is better now   Follow    3   HTN   BP is good      Follow with changes     5   Hx CP   Denies   Ca score is 0   Pt has mild plaquing of aorta  Continue Crestor    6 CV dz Mild plaquing    7 HL  Keep on Crestor    LDL 62 in May 2024    Follow later this winter     Current medicines are reviewed at length with the patient today.  The patient does not have concerns regarding medicines.  Signed, Dietrich Pates, MD  07/25/2023 10:36 AM    [facility] [address] [place], [place], [place]  [address] Phone: [phone]; Fax: [phone]

## 2023-07-24 NOTE — Telephone Encounter (Signed)
Patient returned RN's call and stated she will come in to the office tomorrow at 10:15 am to see Dr. Tenny Craw.

## 2023-07-24 NOTE — Telephone Encounter (Signed)
I am dod tomorrow   I know I have 23   Maybe I should just see her as well    Left a message for the pt to call back and will send her a My Chart message.

## 2023-07-25 ENCOUNTER — Encounter: Payer: Self-pay | Admitting: Internal Medicine

## 2023-07-25 ENCOUNTER — Ambulatory Visit: Payer: Medicare Other | Attending: Internal Medicine | Admitting: Internal Medicine

## 2023-07-25 VITALS — BP 124/62 | HR 70 | Ht 65.0 in | Wt 240.4 lb

## 2023-07-25 DIAGNOSIS — I480 Paroxysmal atrial fibrillation: Secondary | ICD-10-CM

## 2023-07-25 MED ORDER — AMLODIPINE BESYLATE 2.5 MG PO TABS: 2.5000 mg | ORAL_TABLET | Freq: Every day | ORAL | Status: DC

## 2023-07-25 NOTE — Patient Instructions (Addendum)
Medication Instructions:  DECREASE AMLODIPINE TO  2.5 MG ONCE A DAY *If you need a refill on your cardiac medications before your next appointment, please call your pharmacy*   Lab Work:  If you have labs (blood work) drawn today and your tests are completely normal, you will receive your results only by: MyChart Message (if you have MyChart) OR A paper copy in the mail If you have any lab test that is abnormal or we need to change your treatment, we will call you to review the results.   Testing/Procedures:    Follow-Up: At [facility], you and your health needs are our priority.  As part of our continuing mission to provide you with exceptional heart care, we have created designated Provider Care Teams.  These Care Teams include your primary Cardiologist (physician) and Advanced Practice Providers (APPs -  Physician Assistants and Nurse Practitioners) who all work together to provide you with the care you need, when you need it.  We recommend signing up for the patient portal called "MyChart".  Sign up information is provided on this After Visit Summary.  MyChart is used to connect with patients for Virtual Visits (Telemedicine).  Patients are able to view lab/test results, encounter notes, upcoming appointments, etc.  Non-urgent messages can be sent to your provider as well.   To learn more about what you can do with MyChart, go to [email or web address].    Your next appointment:  DECEMBER WITH DR Tenny Craw

## 2023-07-27 ENCOUNTER — Other Ambulatory Visit: Payer: Self-pay | Admitting: Internal Medicine

## 2023-07-30 ENCOUNTER — Ambulatory Visit (HOSPITAL_BASED_OUTPATIENT_CLINIC_OR_DEPARTMENT_OTHER): Payer: Medicare Other | Admitting: Physical Therapy

## 2023-07-30 DIAGNOSIS — M25511 Pain in right shoulder: Secondary | ICD-10-CM

## 2023-07-30 DIAGNOSIS — M25612 Stiffness of left shoulder, not elsewhere classified: Secondary | ICD-10-CM

## 2023-07-30 DIAGNOSIS — M6281 Muscle weakness (generalized): Secondary | ICD-10-CM | POA: Diagnosis not present

## 2023-07-30 DIAGNOSIS — M25512 Pain in left shoulder: Secondary | ICD-10-CM | POA: Diagnosis not present

## 2023-07-30 DIAGNOSIS — M25562 Pain in left knee: Secondary | ICD-10-CM | POA: Diagnosis not present

## 2023-07-30 DIAGNOSIS — M25662 Stiffness of left knee, not elsewhere classified: Secondary | ICD-10-CM | POA: Diagnosis not present
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
## 2023-07-31 ENCOUNTER — Encounter (HOSPITAL_BASED_OUTPATIENT_CLINIC_OR_DEPARTMENT_OTHER): Payer: Self-pay | Admitting: Physical Therapy

## 2023-07-31 ENCOUNTER — Telehealth: Payer: Self-pay

## 2023-07-31 ENCOUNTER — Ambulatory Visit: Payer: Medicare Other

## 2023-07-31 ENCOUNTER — Ambulatory Visit: Payer: Medicare Other | Admitting: Internal Medicine

## 2023-07-31 DIAGNOSIS — Z23 Encounter for immunization: Secondary | ICD-10-CM

## 2023-07-31 NOTE — Telephone Encounter (Signed)
Transition Care Management Unsuccessful Follow-up Telephone Call  Date of discharge and from where:  06/28/2023 [facility]  Attempts:  1st Attempt  Reason for unsuccessful TCM follow-up call:  No answer/busy  [place] K[place]on [facility]  [facility], [facility] Resource Care Guide Direct Dial: [phone]  Website: [email or web address]

## 2023-07-31 NOTE — Progress Notes (Cosign Needed Addendum)
Patient presented in office today for her HD Flu Vaccine. HD Flu Vaccine was administered into her Right Deltoid Muscle. Patient tolerated injection well and injection site looked fine. Patient was advised to report to the office immediately if she notices any adverse reactions.   Medical screening examination/treatment/procedure(s) were performed by non-physician practitioner and as supervising physician I was immediately available for consultation/collaboration.  I agree with above. Jacinta Shoe, MD

## 2023-08-01 ENCOUNTER — Telehealth: Payer: Self-pay

## 2023-08-01 NOTE — Telephone Encounter (Signed)
Transition Care Management Unsuccessful Follow-up Telephone Call  Date of discharge and from where:  06/28/2023 [facility]  Attempts:  2nd Attempt  Reason for unsuccessful TCM follow-up call:  No answer/busy  Keara Pagliarulo [facility]  [facility], [facility] Resource Care Guide Direct Dial: [phone]  Website: [email or web address]

## 2023-08-06 ENCOUNTER — Ambulatory Visit (HOSPITAL_BASED_OUTPATIENT_CLINIC_OR_DEPARTMENT_OTHER): Payer: Medicare Other | Admitting: Physical Therapy

## 2023-08-06 DIAGNOSIS — M6281 Muscle weakness (generalized): Secondary | ICD-10-CM | POA: Diagnosis not present

## 2023-08-06 DIAGNOSIS — M25612 Stiffness of left shoulder, not elsewhere classified: Secondary | ICD-10-CM

## 2023-08-06 DIAGNOSIS — M25511 Pain in right shoulder: Secondary | ICD-10-CM | POA: Diagnosis not present

## 2023-08-06 DIAGNOSIS — M25512 Pain in left shoulder: Secondary | ICD-10-CM | POA: Diagnosis not present

## 2023-08-06 DIAGNOSIS — M25662 Stiffness of left knee, not elsewhere classified: Secondary | ICD-10-CM

## 2023-08-06 DIAGNOSIS — M25562 Pain in left knee: Secondary | ICD-10-CM
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
## 2023-08-07 ENCOUNTER — Other Ambulatory Visit: Payer: Medicare Other

## 2023-08-07 ENCOUNTER — Encounter (HOSPITAL_BASED_OUTPATIENT_CLINIC_OR_DEPARTMENT_OTHER): Payer: Self-pay | Admitting: Physical Therapy

## 2023-08-13 ENCOUNTER — Ambulatory Visit (HOSPITAL_BASED_OUTPATIENT_CLINIC_OR_DEPARTMENT_OTHER): Payer: Medicare Other | Admitting: Physical Therapy

## 2023-08-13 DIAGNOSIS — M25511 Pain in right shoulder: Secondary | ICD-10-CM | POA: Diagnosis not present

## 2023-08-13 DIAGNOSIS — M25562 Pain in left knee: Secondary | ICD-10-CM

## 2023-08-13 DIAGNOSIS — M25512 Pain in left shoulder: Secondary | ICD-10-CM

## 2023-08-13 DIAGNOSIS — M25662 Stiffness of left knee, not elsewhere classified: Secondary | ICD-10-CM

## 2023-08-13 DIAGNOSIS — M6281 Muscle weakness (generalized): Secondary | ICD-10-CM | POA: Diagnosis not present

## 2023-08-13 DIAGNOSIS — M25612 Stiffness of left shoulder, not elsewhere classified: Secondary | ICD-10-CM | POA: Diagnosis not present
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
## 2023-08-14 ENCOUNTER — Encounter (HOSPITAL_BASED_OUTPATIENT_CLINIC_OR_DEPARTMENT_OTHER): Payer: Self-pay | Admitting: Physical Therapy

## 2023-08-25 NOTE — Progress Notes (Unsigned)
Cardiology Office Note   Date:  08/27/2023   ID:  Amanda Wilkins, DOB 01/10/1946, MRN [id number]  PCP:  Tresa Garter, MD  Cardiologist:   Dietrich Pates, MD   Pt here for follow up of LE edema      History of Present Illness: Amanda Wilkins is a 77 y.o. female with a history of chest pain, HTN, HL, aortic atherosclerosis,  intermittent atrial fib( 2022 Flecanide started then DCCV in 2022;  Reverted to afib, flecanide dose increased), Atrial flutter 2019, SVT, OSA (on CPAP) and obesity. Myovue (2016) showed inferolateral and inferior defect consistent with ischemia  CT calcium score (2020) 0 PFTs normal CPX done in Aug 2021.  Limitation felt due to body weight  July 2024 She has had problems with intermitt LE edema as well as SOB   PT admitted to increase salt intake    Fall 2024- Lasix dose adjusted due to increased LE edema  I saw the pt on 07/25/23    The pt presents today for follow up    Says she still has mild LE edema   Frustrated by this     Denies SOB  no CP   no PND  She is working out a few days per wk at [employer]      Denies palpitations  No dizziness      Allergies:   Epinephrine base, Aspirin, Benadryl [diphenhydramine], Covid-19 ad26 vaccine(janssen), Fish allergy, Hylan g-f 20, Penicillins, Tape, and Wound dressing adhesive   Past Medical History:  Diagnosis Date   Acute bronchitis 09/11/2016   11/17 refractory   Acute cystitis without hematuria 05/17/2015   Acute kidney injury [facility])    Labs today   Acute right ankle pain 09/09/2018   Anticoagulant long-term use    Xarelto   Axillary pain    Bronchitis    Cellulitis    Cholecystitis    Chronic interstitial nephritis    CONJUNCTIVITIS, ACUTE 10/18/2010   Qualifier: Diagnosis of  By: Plotnikov MD, Georgina Quint    D-dimer, elevated    Depression    Eye inflammation    GERD (gastroesophageal reflux disease)    History of interstitial nephritis 2016   chronic   History of kidney stones     has a small one found on xray   History of nuclear stress test 12/16/2014   Intermediate risk nuclear study w/ medium size moderate severity reversible defect in the basal and mid inferolateral and inferior wall (per dr cardiology note , dr Dietrich Pates did not think this was consistent with ischemia)/  normal LV function and wall motion, ef 75%   History of septic shock 01/22/2015   in setting Group A Strep Cellulits erysipelas/chest wall induration with streptoccocus basterium-- Severe sepsis, DIC, Acute respiratory failure with pulmonary edema, Acute Kidney failure with chronic interstitial nephritis   Hypertension    Hyponatremia    Migraines    on Zoloft for migraines   Mild carotid artery disease (HCC)    per duplex 08-30-2017 bilateral ICA 1-39%   OA (osteoarthritis) rheumotologist-  dr Zenovia Jordan   both knees,  shoulders, ankles   OSA on CPAP     moderate obstructive sleep apnea with an AHI of 23.4/h and oxygen desaturations as low as 84%.  Now on CPAP at 12 cm H2O.   PAF (paroxysmal atrial fibrillation) [facility]) 2009   cardiologist-- dr Dietrich Pates   Paroxysmal atrial flutter [facility])    a. dx 11/2017.   PSVT (  paroxysmal supraventricular tachycardia) (HCC)    RA (rheumatoid arthritis) (HCC)    Wears contact lenses     Past Surgical History:  Procedure Laterality Date   BREAST BIOPSY Right 05/15/2017   PASH   CARDIOVERSION N/A 08/28/2021   Procedure: CARDIOVERSION;  Surgeon: Pricilla Riffle, MD;  Location: [facility] ENDOSCOPY;  Service: Cardiovascular;  Laterality: N/A;   CESAREAN SECTION  1980   CHOLECYSTECTOMY N/A 11/16/2021   Procedure: LAPAROSCOPIC CHOLECYSTECTOMY;  Surgeon: Axel Filler, MD;  Location: [facility] OR;  Service: General;  Laterality: N/A;   ESOPHAGOGASTRODUODENOSCOPY N/A 02/08/2015   Procedure: ESOPHAGOGASTRODUODENOSCOPY (EGD);  Surgeon: Louis Meckel, MD;  Location: [facility] ENDOSCOPY;  Service: Endoscopy;  Laterality: N/A;   KNEE ARTHROSCOPY W/ MENISCAL REPAIR Left 08/2014     @[facility]    SHOULDER SURGERY Right 04/04/2016   TOTAL HIP ARTHROPLASTY Right 06/19/2022   Procedure: RIGHT TOTAL HIP ARTHROPLASTY ANTERIOR APPROACH;  Surgeon: Kathryne Hitch, MD;  Location: [facility] OR;  Service: Orthopedics;  Laterality: Right;   TOTAL KNEE ARTHROPLASTY Right 09/01/2018   Procedure: RIGHT TOTAL KNEE ARTHROPLASTY;  Surgeon: Dannielle Huh, MD;  Location: [facility] ORS;  Service: Orthopedics;  Laterality: Right;     Social History:  The patient  reports that she has never smoked. She has never used smokeless tobacco. She reports that she does not currently use alcohol. She reports that she does not use drugs.   Family History:  The patient's family history includes Allergic rhinitis in her sister; Breast cancer (age of onset: 60) in her sister; Lymphoma in her mother; Pancreatic cancer in her brother; Prostate cancer in her father; Urticaria in her sister.    ROS:  Please see the history of present illness. All other systems are reviewed and  Negative to the above problem except as noted.    PHYSICAL EXAM: VS:  BP (!) 156/84 (BP Location: Right Arm, Patient Position: Sitting, Cuff Size: Large)   Pulse 68   Wt 246 lb (111.6 kg)   SpO2 97%   BMI 40.94 kg/m   GEN: Obese 77 yo  in no acute distress  HEENT: normal  Neck: JVP is normal   Cardiac: RRR; no murmurs  Tr LE  edema  blaterally  Respiratory:  clear to auscultation GI: soft, nontender.  No hepatomegaly    EKG:  EKG is not done today.    Echo   Dec 2023     1. Left ventricular ejection fraction, by estimation, is 55 to 60%. The  left ventricle has normal function. The left ventricle has no regional  wall motion abnormalities. There is mild concentric left ventricular  hypertrophy. Left ventricular diastolic  parameters are consistent with Grade II diastolic dysfunction  (pseudonormalization).   2. Right ventricular systolic function is normal. The right ventricular  size is mildly enlarged. There is mildly elevated  pulmonary artery  systolic pressure. The estimated right ventricular systolic pressure is  39.2 mmHg.   3. Left atrial size was moderately dilated.   4. Right atrial size was moderately dilated.   5. The mitral valve is normal in structure. Mild mitral valve  regurgitation. No evidence of mitral stenosis.   6. Tricuspid valve regurgitation is mild to moderate.   7. The aortic valve is tricuspid. There is mild calcification of the  aortic valve. Aortic valve regurgitation is not visualized. No aortic  stenosis is present.   8. The inferior vena cava is normal in size with greater than 50%  respiratory variability, suggesting right atrial pressure  of 3 mmHg.  Lipid Panel    Component Value Date/Time   CHOL 168 03/06/2022 1143   TRIG 52 03/06/2022 1143   HDL 98 03/06/2022 1143   CHOLHDL 1.7 03/06/2022 1143   CHOLHDL 2 05/28/2018 0837   VLDL 13.8 05/28/2018 0837   LDLCALC 59 03/06/2022 1143      Wt Readings from Last 3 Encounters:  08/27/23 246 lb (111.6 kg)  07/25/23 240 lb 6.4 oz (109 kg)  06/20/23 237 lb (107.5 kg)    Echo: ( Jan 2024) 1. Left ventricular ejection fraction, by estimation, is 55 to 60%. The  left ventricle has normal function. The left ventricle has no regional  wall motion abnormalities. There is mild concentric left ventricular  hypertrophy. Left ventricular diastolic  parameters are consistent with Grade II diastolic dysfunction  (pseudonormalization).   2. Right ventricular systolic function is normal. The right ventricular  size is mildly enlarged. There is mildly elevated pulmonary artery  systolic pressure. The estimated right ventricular systolic pressure is  39.2 mmHg.   3. Left atrial size was moderately dilated.   4. Right atrial size was moderately dilated.   5. The mitral valve is normal in structure. Mild mitral valve  regurgitation. No evidence of mitral stenosis.   6. Tricuspid valve regurgitation is mild to moderate.   7. The aortic valve  is tricuspid. There is mild calcification of the  aortic valve. Aortic valve regurgitation is not visualized. No aortic  stenosis is present.   8. The inferior vena cava is normal in size with greater than 50%  respiratory variability, suggesting right atrial pressure of 3 mmHg.     ASSESSMENT AND PLAN  1  Edema/HFpEF  The pt has trivial LE edema   Overall volume status looks good otherwise  She can increase lasix to 40 daily with KCL     Follow up on My Chart with response    2 PAF   Keep on Flecanide and  Eliquis  No palpitations   3   HTN   The pt says BP is much better at home    120s/  Follow  Bring cuff to next MD visit to confirm accuracy   5   Hx CP  No known CAD PT denies   CP  6 CV dz Mild plaquing    7 HL  Keep on Crestor    LDL 62 in May 2024    Follow later this winter   Encouraged her to stay active at [facility]  Consider water activities    Current medicines are reviewed at length with the patient today.  The patient does not have concerns regarding medicines.  Signed, Dietrich Pates, MD  08/27/2023 8:43 PM    [facility] [address] [place], [place], [place]  [address] Phone: [phone]; Fax: [phone]

## 2023-08-26 ENCOUNTER — Ambulatory Visit (HOSPITAL_BASED_OUTPATIENT_CLINIC_OR_DEPARTMENT_OTHER): Payer: Medicare Other | Attending: Orthopaedic Surgery | Admitting: Physical Therapy

## 2023-08-26 DIAGNOSIS — M25512 Pain in left shoulder: Secondary | ICD-10-CM | POA: Diagnosis not present

## 2023-08-26 DIAGNOSIS — M25662 Stiffness of left knee, not elsewhere classified: Secondary | ICD-10-CM | POA: Diagnosis not present

## 2023-08-26 DIAGNOSIS — M25671 Stiffness of right ankle, not elsewhere classified: Secondary | ICD-10-CM | POA: Insufficient documentation

## 2023-08-26 DIAGNOSIS — M25511 Pain in right shoulder: Secondary | ICD-10-CM | POA: Diagnosis not present

## 2023-08-26 DIAGNOSIS — M6281 Muscle weakness (generalized): Secondary | ICD-10-CM | POA: Insufficient documentation

## 2023-08-26 DIAGNOSIS — M25572 Pain in left ankle and joints of left foot: Secondary | ICD-10-CM | POA: Diagnosis not present

## 2023-08-26 DIAGNOSIS — M25562 Pain in left knee: Secondary | ICD-10-CM | POA: Diagnosis not present

## 2023-08-26 DIAGNOSIS — M25612 Stiffness of left shoulder, not elsewhere classified: Secondary | ICD-10-CM | POA: Diagnosis not present
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
## 2023-08-27 ENCOUNTER — Encounter (HOSPITAL_BASED_OUTPATIENT_CLINIC_OR_DEPARTMENT_OTHER): Payer: Self-pay | Admitting: Physical Therapy

## 2023-08-27 ENCOUNTER — Encounter: Payer: Self-pay | Admitting: Internal Medicine

## 2023-08-27 ENCOUNTER — Ambulatory Visit: Payer: Medicare Other | Attending: Internal Medicine | Admitting: Internal Medicine

## 2023-08-27 ENCOUNTER — Ambulatory Visit: Payer: Medicare Other | Admitting: Physician Assistant

## 2023-08-27 VITALS — BP 156/84 | HR 68 | Wt 246.0 lb

## 2023-08-27 DIAGNOSIS — I480 Paroxysmal atrial fibrillation: Secondary | ICD-10-CM | POA: Insufficient documentation

## 2023-08-27 NOTE — Patient Instructions (Signed)
Medication Instructions:   Lasix daily with your Potassium   *If you need a refill on your cardiac medications before your next appointment, please call your pharmacy*   Lab Work:  If you have labs (blood work) drawn today and your tests are completely normal, you will receive your results only by: MyChart Message (if you have MyChart) OR A paper copy in the mail If you have any lab test that is abnormal or we need to change your treatment, we will call you to review the results.   Testing/Procedures:    Follow-Up: At [facility], you and your health needs are our priority.  As part of our continuing mission to provide you with exceptional heart care, we have created designated Provider Care Teams.  These Care Teams include your primary Cardiologist (physician) and Advanced Practice Providers (APPs -  Physician Assistants and Nurse Practitioners) who all work together to provide you with the care you need, when you need it.  We recommend signing up for the patient portal called "MyChart".  Sign up information is provided on this After Visit Summary.  MyChart is used to connect with patients for Virtual Visits (Telemedicine).  Patients are able to view lab/test results, encounter notes, upcoming appointments, etc.  Non-urgent messages can be sent to your provider as well.   To learn more about what you can do with MyChart, go to [email or web address].

## 2023-08-29 ENCOUNTER — Encounter (HOSPITAL_BASED_OUTPATIENT_CLINIC_OR_DEPARTMENT_OTHER): Payer: Medicare Other | Admitting: Physical Therapy

## 2023-09-05 ENCOUNTER — Telehealth: Payer: Self-pay | Admitting: Internal Medicine

## 2023-09-05 ENCOUNTER — Ambulatory Visit (HOSPITAL_BASED_OUTPATIENT_CLINIC_OR_DEPARTMENT_OTHER): Payer: Medicare Other | Admitting: Physical Therapy

## 2023-09-05 ENCOUNTER — Telehealth: Payer: Self-pay

## 2023-09-05 ENCOUNTER — Other Ambulatory Visit: Payer: Self-pay | Admitting: Internal Medicine

## 2023-09-05 DIAGNOSIS — M6281 Muscle weakness (generalized): Secondary | ICD-10-CM

## 2023-09-05 DIAGNOSIS — M25512 Pain in left shoulder: Secondary | ICD-10-CM

## 2023-09-05 DIAGNOSIS — M25511 Pain in right shoulder: Secondary | ICD-10-CM | POA: Diagnosis not present

## 2023-09-05 DIAGNOSIS — M25612 Stiffness of left shoulder, not elsewhere classified: Secondary | ICD-10-CM

## 2023-09-05 DIAGNOSIS — M25662 Stiffness of left knee, not elsewhere classified: Secondary | ICD-10-CM | POA: Diagnosis not present

## 2023-09-05 DIAGNOSIS — M25562 Pain in left knee: Secondary | ICD-10-CM | POA: Diagnosis not present

## 2023-09-05 DIAGNOSIS — R600 Localized edema: Secondary | ICD-10-CM

## 2023-09-05 NOTE — Telephone Encounter (Signed)
...     Pre-operative Risk Assessment    Patient Name: Amanda Wilkins  DOB: [birth year] MRN: [id number]     Request for Surgical Clearance    Procedure:  Dental Extraction - Amount of Teeth to be Pulled:  2  Date of Surgery:  Clearance TBD                                 Surgeon:  dr Leonel Ramsay Group or Practice [facility] Phone number:  [phone] Fax number:  [phone]   Type of Clearance Requested:   - Medical  - Pharmacy:  Hold Apixaban (Eliquis)     Type of Anesthesia:  Local    Additional requests/questions:   last o/v 08/27/23, no new appt  Signed, Renee Ramus   09/05/2023, 12:29 PM

## 2023-09-05 NOTE — Telephone Encounter (Signed)
Pt c/o medication issue:  1. Name of Medication: furosemide (LASIX) 40 MG tablet   2. How are you currently taking this medication (dosage and times per day)?   Take 1 tablet (40 mg total) by mouth daily.Patient taking differently: Take 40 mg by mouth as needed.    3. Are you having a reaction (difficulty breathing--STAT)? No  4. What is your medication issue? Patient stated that she has been taking the lasix medication for a week. Patient mentioned the swelling went down while taking the lasix, but once she stopped taking the medication. Patient stated she has been having more swelling. Please advise.
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
## 2023-09-05 NOTE — Telephone Encounter (Signed)
Her echo in Jan 2024 showed normal pumping function   THe heart was a little stiffer which may explain the swelling     COuld order a limited echo to confirm no significant change

## 2023-09-05 NOTE — Telephone Encounter (Signed)
Pt says she has been taking the Lasix 40 mg daily for a week... she says the edema was better but not fully gone.. she held it for a day and the swelling was back and it was pitting. She took it today and it helped. She is worried and cannot understand why the swelling is occurring... she is not SOB.... she is not eating added salt.   I will send to Dr Tenny Craw.

## 2023-09-06 ENCOUNTER — Encounter (HOSPITAL_BASED_OUTPATIENT_CLINIC_OR_DEPARTMENT_OTHER): Payer: Self-pay | Admitting: Physical Therapy

## 2023-09-06 NOTE — Telephone Encounter (Signed)
Left a message for the pt to call back... limited Echo ordered.

## 2023-09-06 NOTE — Telephone Encounter (Signed)
Patient is requesting call back to see if she still needs upcoming tele appt for clearance. States she recently saw Dr. Tenny Craw and was told this appt would not be needed. Please advise. She would also like to discuss if this appt will be acceptable clearance for recent clearance added for a dental procedure. Please advise.

## 2023-09-06 NOTE — Telephone Encounter (Signed)
Appointment has been cancelled. Waiting for preop to address dental clearance

## 2023-09-06 NOTE — Telephone Encounter (Signed)
Pt having her Echo 09/25/23.

## 2023-09-07 ENCOUNTER — Other Ambulatory Visit: Payer: Self-pay | Admitting: Internal Medicine

## 2023-09-09 ENCOUNTER — Ambulatory Visit: Payer: Medicare Other | Admitting: Internal Medicine

## 2023-09-09 NOTE — Telephone Encounter (Signed)
Patient with diagnosis of AF on Eliquis for anticoagulation.    Procedure: dental extraction; 2 teeth to be pulled Date of procedure: TBD   CHA2DS2-VASc Score = 5   This indicates a 7.2% annual risk of stroke. The patient's score is based upon: CHF History: 0 HTN History: 1 Diabetes History: 0 Stroke History: 0 Vascular Disease History: 1 Age Score: 2 Gender Score: 1      CrCl 100 mL/min Platelet count 183  Patient does not require pre-op antibiotics for dental procedure.  Per office protocol, patient does not need to hold Eliquis prior to procedure.   Patient will not need bridging with Lovenox (enoxaparin) around procedure.  **This guidance is not considered finalized until pre-operative APP has relayed final recommendations.**  Wilmer Floor, PharmD PGY2 Cardiology Pharmacy Resident

## 2023-09-09 NOTE — Telephone Encounter (Signed)
Dr. Rayfield Citizen office is calling to follow up on clearance request and to receive the records that they requested such as last OV note and echo report.

## 2023-09-10 ENCOUNTER — Encounter (HOSPITAL_BASED_OUTPATIENT_CLINIC_OR_DEPARTMENT_OTHER): Payer: Self-pay

## 2023-09-10 DIAGNOSIS — D2239 Melanocytic nevi of other parts of face: Secondary | ICD-10-CM | POA: Diagnosis not present

## 2023-09-10 DIAGNOSIS — L821 Other seborrheic keratosis: Secondary | ICD-10-CM | POA: Diagnosis not present

## 2023-09-10 NOTE — Telephone Encounter (Signed)
    Primary Cardiologist: Dietrich Pates, MD  Chart reviewed as part of pre-operative protocol coverage. Simple dental extractions are considered low risk procedures per guidelines and generally do not require any specific cardiac clearance. It is also generally accepted that for simple extractions and dental cleanings, there is no need to interrupt blood thinner therapy.   SBE prophylaxis is not required for the patient.  I will route this recommendation to the requesting party via Epic fax function and remove from pre-op pool.  Please call with questions.  Sharlene Dory, PA-C 09/10/2023, 8:17 AM

## 2023-09-16 ENCOUNTER — Telehealth: Payer: Medicare Other

## 2023-09-16 DIAGNOSIS — S39012A Strain of muscle, fascia and tendon of lower back, initial encounter: Secondary | ICD-10-CM | POA: Diagnosis not present

## 2023-09-17 ENCOUNTER — Telehealth: Payer: Self-pay | Admitting: Internal Medicine

## 2023-09-17 ENCOUNTER — Other Ambulatory Visit: Payer: Self-pay

## 2023-09-17 ENCOUNTER — Other Ambulatory Visit: Payer: Self-pay | Admitting: Internal Medicine

## 2023-09-17 ENCOUNTER — Ambulatory Visit: Payer: Medicare Other | Admitting: Physician Assistant

## 2023-09-17 DIAGNOSIS — I4819 Other persistent atrial fibrillation: Secondary | ICD-10-CM

## 2023-09-17 MED ORDER — APIXABAN 5 MG PO TABS: ORAL_TABLET | ORAL | 5 refills | Status: DC

## 2023-09-17 NOTE — Telephone Encounter (Signed)
Prescription refill request for Eliquis received. Indication:afib Last office visit:11/24 Scr:0.84  10/24 Age: 77 Weight:111.6  kg  Prescription refilled

## 2023-09-17 NOTE — Telephone Encounter (Signed)
*  STAT* If patient is at the pharmacy, call can be transferred to refill team.   1. Which medications need to be refilled? (please list name of each medication and dose if known) apixaban (ELIQUIS) 5 MG TABS tablet    2. Would you like to learn more about the convenience, safety, & potential cost savings by using the [facility]?     3. Are you open to using the [facility] (Type [facility]. ).   4. Which pharmacy/location (including street and city if local pharmacy) is medication to be sent to? [facility] [id number] - , [place] - [address]    5. Do they need a 30 day or 90 day supply? 30

## 2023-09-18 ENCOUNTER — Encounter: Payer: Self-pay | Admitting: Physical Medicine and Rehabilitation

## 2023-09-18 ENCOUNTER — Ambulatory Visit: Payer: Medicare Other | Admitting: Physical Medicine and Rehabilitation

## 2023-09-18 DIAGNOSIS — S39012A Strain of muscle, fascia and tendon of lower back, initial encounter: Secondary | ICD-10-CM

## 2023-09-18 DIAGNOSIS — M7918 Myalgia, other site: Secondary | ICD-10-CM | POA: Diagnosis not present

## 2023-09-18 NOTE — Progress Notes (Signed)
Back pain started Monday.  She was seen by a doctor Monday at [facility] and had an xray done he stated it could be muscular she was given a dose pack but is now having a reaction and it didn't touch the pain at all.  She is in PT and stated she could take a Rx there if needed regarding her back. She is taking tylenol as needed.  She did recall picking up a very heavy flower pot Sunday on her deck and the next day is when the pain started.  Pain is more over on the L side.
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
## 2023-09-19 ENCOUNTER — Ambulatory Visit (HOSPITAL_BASED_OUTPATIENT_CLINIC_OR_DEPARTMENT_OTHER): Payer: Medicare Other | Attending: Orthopaedic Surgery | Admitting: Physical Therapy

## 2023-09-19 DIAGNOSIS — M25612 Stiffness of left shoulder, not elsewhere classified: Secondary | ICD-10-CM

## 2023-09-19 DIAGNOSIS — M6281 Muscle weakness (generalized): Secondary | ICD-10-CM

## 2023-09-19 DIAGNOSIS — M25511 Pain in right shoulder: Secondary | ICD-10-CM

## 2023-09-19 DIAGNOSIS — M25662 Stiffness of left knee, not elsewhere classified: Secondary | ICD-10-CM | POA: Diagnosis not present

## 2023-09-19 DIAGNOSIS — M25512 Pain in left shoulder: Secondary | ICD-10-CM

## 2023-09-19 DIAGNOSIS — S39012A Strain of muscle, fascia and tendon of lower back, initial encounter: Secondary | ICD-10-CM | POA: Diagnosis not present

## 2023-09-19 DIAGNOSIS — M25562 Pain in left knee: Secondary | ICD-10-CM

## 2023-09-19 DIAGNOSIS — M7918 Myalgia, other site: Secondary | ICD-10-CM | POA: Insufficient documentation
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
## 2023-09-20 ENCOUNTER — Encounter (HOSPITAL_BASED_OUTPATIENT_CLINIC_OR_DEPARTMENT_OTHER): Payer: Self-pay | Admitting: Physical Therapy

## 2023-09-22 ENCOUNTER — Other Ambulatory Visit: Payer: Self-pay

## 2023-09-22 ENCOUNTER — Emergency Department (HOSPITAL_COMMUNITY)
Admission: EM | Admit: 2023-09-22 | Discharge: 2023-09-22 | Disposition: A | Discharge status: Home / Self Care | Payer: Medicare Other | Attending: Emergency Medicine | Admitting: Emergency Medicine

## 2023-09-22 ENCOUNTER — Encounter (HOSPITAL_COMMUNITY): Payer: Self-pay

## 2023-09-22 DIAGNOSIS — I480 Paroxysmal atrial fibrillation: Secondary | ICD-10-CM | POA: Insufficient documentation

## 2023-09-22 DIAGNOSIS — Z7901 Long term (current) use of anticoagulants: Secondary | ICD-10-CM | POA: Diagnosis not present

## 2023-09-22 DIAGNOSIS — Z79899 Other long term (current) drug therapy: Secondary | ICD-10-CM | POA: Insufficient documentation

## 2023-09-22 DIAGNOSIS — I10 Essential (primary) hypertension: Secondary | ICD-10-CM | POA: Insufficient documentation

## 2023-09-22 LAB — BASIC METABOLIC PANEL
Anion gap: 11 (ref 5–15)
BUN: 16 mg/dL (ref 8–23)
CO2: 25 mmol/L (ref 22–32)
Calcium: 9.5 mg/dL (ref 8.9–10.3)
Chloride: 99 mmol/L (ref 98–111)
Creatinine, Ser: 0.79 mg/dL (ref 0.44–1.00)
GFR, Estimated: >60 mL/min (ref >60)
Glucose, Bld: 94 mg/dL (ref 70–99)
Potassium: 4.5 mmol/L (ref 3.5–5.1)
Sodium: 135 mmol/L (ref 135–145)

## 2023-09-22 LAB — CBC
HCT: 41.3 % (ref 36.0–46.0)
Hemoglobin: 14.1 g/dL (ref 12.0–15.0)
MCH: 32.6 pg (ref 26.0–34.0)
MCHC: 34.1 g/dL (ref 30.0–36.0)
MCV: 95.4 fL (ref 80.0–100.0)
Platelets: 213 10*3/uL (ref 150–400)
RBC: 4.33 MIL/uL (ref 3.87–5.11)
RDW: 13.3 % (ref 11.5–15.5)
WBC: 7.1 10*3/uL (ref 4.0–10.5)
nRBC: 0 % (ref 0.0–0.2)

## 2023-09-22 LAB — CBG MONITORING, ED: Glucose-Capillary: 90 mg/dL (ref 70–99)

## 2023-09-22 NOTE — ED Triage Notes (Addendum)
Pt to ED via pov from home. Pt states she woke up this am and felt like her face was flushed so she took her BP. Pt states her BP at home was 184/100. Pt states she has HTN but it is not usually that high. Pt denies any pain at this time. Pt is taking prednisone for back injury. Pt states she feels shaky.

## 2023-09-22 NOTE — ED Provider Notes (Signed)
[place] EMERGENCY DEPARTMENT AT [facility] Provider Note   CSN: [id number] Arrival date & time: 09/22/23  7829     History {Add pertinent medical, surgical, social history, OB history to HPI:1} Chief Complaint  Patient presents with   Hypertension    Amanda Wilkins is a 77 y.o. female.  HPI       77 year old female with a history of hypertension, paroxysmal atrial fibrillation on Eliquis, rheumatoid arthritis, recent evaluation at Ortho care with concern for back pain  Steroids feeling flushing but BP had been ok, steroids for 4 days today was last one. Picked up flower pot 1.5 weeks ago, went to [facility] and started on predisone, now scheduled to have dry needling Yesterday had light dinner before bed, felt normal, no etoh or wine for a while, did have ham at lunch, thinks salt may have made it worse.  Scrambled egg/avocado for dinner, special K in AM. Not sure what triggered this, took BP and higher than have ever seen 187/110, felt like face was hot, was red, then had mild headache Has had prednisone before without reaction, but this time each day had more flushing  No chest pain pain or dyspnea Denies numbness, weakness, difficulty talking or walking, visual changes or facial droop.  Had a little bit of headache that has improved now  Past Medical History:  Diagnosis Date   Acute bronchitis 09/11/2016   11/17 refractory   Acute cystitis without hematuria 05/17/2015   Acute kidney injury [facility])    Labs today   Acute right ankle pain 09/09/2018   Anticoagulant long-term use    Xarelto   Axillary pain    Bronchitis    Cellulitis    Cholecystitis    Chronic interstitial nephritis    CONJUNCTIVITIS, ACUTE 10/18/2010   Qualifier: Diagnosis of  By: Plotnikov MD, Georgina Quint    D-dimer, elevated    Depression    Eye inflammation    GERD (gastroesophageal reflux disease)    History of interstitial nephritis 2016   chronic   History of kidney  stones    has a small one found on xray   History of nuclear stress test 12/16/2014   Intermediate risk nuclear study w/ medium size moderate severity reversible defect in the basal and mid inferolateral and inferior wall (per dr cardiology note , dr Dietrich Pates did not think this was consistent with ischemia)/  normal LV function and wall motion, ef 75%   History of septic shock 01/22/2015   in setting Group A Strep Cellulits erysipelas/chest wall induration with streptoccocus basterium-- Severe sepsis, DIC, Acute respiratory failure with pulmonary edema, Acute Kidney failure with chronic interstitial nephritis   Hypertension    Hyponatremia    Migraines    on Zoloft for migraines   Mild carotid artery disease (HCC)    per duplex 08-30-2017 bilateral ICA 1-39%   OA (osteoarthritis) rheumotologist-  dr Zenovia Jordan   both knees,  shoulders, ankles   OSA on CPAP     moderate obstructive sleep apnea with an AHI of 23.4/h and oxygen desaturations as low as 84%.  Now on CPAP at 12 cm H2O.   PAF (paroxysmal atrial fibrillation) [facility]) 2009   cardiologist-- dr Dietrich Pates   Paroxysmal atrial flutter [facility])    a. dx 11/2017.   PSVT (paroxysmal supraventricular tachycardia) (HCC)    RA (rheumatoid arthritis) (HCC)    Wears contact lenses      Home Medications Prior to Admission medications  Medication Sig Start Date End Date Taking? Authorizing Provider  acetaminophen (TYLENOL) 500 MG tablet Take 1,000 mg by mouth daily as needed for moderate pain or mild pain (pain).    [provider]  ALPRAZolam Prudy Feeler) 0.25 MG tablet Take 1 tablet (0.25 mg total) by mouth 2 (two) times daily as needed for anxiety. 07/18/22   Plotnikov, Georgina Quint, MD  amLODipine (NORVASC) 2.5 MG tablet Take 1 tablet (2.5 mg total) by mouth daily. 07/25/23   Pricilla Riffle, MD  apixaban (ELIQUIS) 5 MG TABS tablet TAKE 1 TABLET(5 MG) BY MOUTH TWICE DAILY 09/17/23   Pricilla Riffle, MD  cholecalciferol (VITAMIN D3) 25 MCG  (1000 UT) tablet Take 1,000 Units by mouth daily.    [provider]  EPINEPHrine 0.3 mg/0.3 mL IJ SOAJ injection Inject 0.3 mg into the muscle as needed for anaphylaxis. As needed for life-threatening allergic reactions 04/02/22   Alfonse Spruce, MD  famotidine (PEPCID) 40 MG tablet TAKE 1 TABLET(40 MG) BY MOUTH DAILY 07/29/23   Plotnikov, Georgina Quint, MD  flecainide (TAMBOCOR) 50 MG tablet Take 1.5 tablets (75 mg total) by mouth 2 (two) times daily. 11/06/22   Pricilla Riffle, MD  furosemide (LASIX) 40 MG tablet Take 1 tablet (40 mg total) by mouth daily. Patient taking differently: Take 40 mg by mouth as needed. 07/19/23 10/17/23  Nahser, Deloris Ping, MD  hydroxychloroquine (PLAQUENIL) 200 MG tablet Take 400 mg by mouth every morning.    [provider]  metoprolol tartrate (LOPRESSOR) 25 MG tablet TAKE 1 TABLET BY MOUTH THREE TIMES DAILY CAN TAKE ADDITIONAL TABLET AS NEEDED FOR PALPITATIONS 09/09/23   Pricilla Riffle, MD  potassium chloride (KLOR-CON) 10 MEQ tablet Take 2 tablets by mouth daily 07/19/23   Nahser, Deloris Ping, MD  rosuvastatin (CRESTOR) 5 MG tablet TAKE 1 TABLET(5 MG) BY MOUTH DAILY 06/13/23   Plotnikov, Georgina Quint, MD  sertraline (ZOLOFT) 50 MG tablet TAKE 1 TABLET BY MOUTH DAILY 07/29/23   Plotnikov, Georgina Quint, MD      Allergies    Epinephrine base, Aspirin, Benadryl [diphenhydramine], Covid-19 ad26 vaccine(janssen), Fish allergy, Hylan g-f 20, Penicillins, Tape, and Wound dressing adhesive    Review of Systems   Review of Systems  Physical Exam Updated Vital Signs BP (!) 143/84   Pulse 78   Temp 97.8 F (36.6 C) (Oral)   Resp 11   Ht 5\' 5"  (1.651 m)   Wt 106.6 kg   SpO2 99%   BMI 39.11 kg/m  Physical Exam  ED Results / Procedures / Treatments   Labs (all labs ordered are listed, but only abnormal results are displayed) Labs Reviewed  CBC  URINALYSIS, ROUTINE W REFLEX MICROSCOPIC  BASIC METABOLIC PANEL  CBG MONITORING, ED     EKG None  Radiology No results found.  Procedures Procedures  {Document cardiac monitor, telemetry assessment procedure when appropriate:1}  Medications Ordered in ED Medications - No data to display  ED Course/ Medical Decision Making/ A&P   {   Click here for ABCD2, HEART and other calculatorsREFRESH Note before signing :1}                              Medical Decision Making Amount and/or Complexity of Data Reviewed Labs: ordered.   ***  {Document critical care time when appropriate:1} {Document review of labs and clinical decision tools ie heart score, Chads2Vasc2 etc:1}  {Document your independent review  of radiology images, and any outside records:1} {Document your discussion with family members, caretakers, and with consultants:1} {Document social determinants of health affecting pt's care:1} {Document your decision making why or why not admission, treatments were needed:1} Final Clinical Impression(s) / ED Diagnoses Final diagnoses:  None    Rx / DC Orders ED Discharge Orders     None

## 2023-09-23 ENCOUNTER — Encounter: Payer: Self-pay | Admitting: Internal Medicine

## 2023-09-23 ENCOUNTER — Telehealth: Payer: Self-pay

## 2023-09-23 NOTE — Telephone Encounter (Signed)
Please call patient and let her know why this is not being refilled.  [phone]

## 2023-09-23 NOTE — Transitions of Care (Post Inpatient/ED Visit) (Signed)
09/23/2023  Name: Amanda Wilkins MRN: [id number] DOB: October 05, 1946  Today's TOC FU Call Status: Today's TOC FU Call Status:: Successful TOC FU Call Completed TOC FU Call Complete Date: 09/23/23 Patient's Name and Date of Birth confirmed.  Transition Care Management Follow-up Telephone Call Date of Discharge: 09/22/23 Discharge Facility: [facility]) Type of Discharge: Emergency Department Reason for ED Visit: Other: (hypertension) How have you been since you were released from the hospital?: Better Any questions or concerns?: No  Items Reviewed: Did you receive and understand the discharge instructions provided?: Yes Medications obtained,verified, and reconciled?: Yes (Medications Reviewed) Any new allergies since your discharge?: No Dietary orders reviewed?: Yes Do you have support at home?: Yes People in Home: spouse  Medications Reviewed Today: Medications Reviewed Today     Reviewed by Karena Addison, LPN (Licensed Practical Nurse) on 09/23/23 at 1624  Med List Status: <None>   Medication Order Taking? Sig Documenting Provider Last Dose Status Informant  acetaminophen (TYLENOL) 500 MG tablet [id number] No Take 1,000 mg by mouth daily as needed for moderate pain or mild pain (pain). [provider] Taking Active Self  ALPRAZolam (XANAX) 0.25 MG tablet [id number] No Take 1 tablet (0.25 mg total) by mouth 2 (two) times daily as needed for anxiety. Plotnikov, Georgina Quint, MD Taking Active   amLODipine (NORVASC) 2.5 MG tablet [id number]  Take 1 tablet (2.5 mg total) by mouth daily. Pricilla Riffle, MD  Active   apixaban (ELIQUIS) 5 MG TABS tablet [id number]  TAKE 1 TABLET(5 MG) BY MOUTH TWICE DAILY Pricilla Riffle, MD  Active   cholecalciferol (VITAMIN D3) 25 MCG (1000 UT) tablet [id number] No Take 1,000 Units by mouth daily. [provider] Taking Active Self  EPINEPHrine 0.3 mg/0.3 mL IJ SOAJ injection [id number] No Inject 0.3 mg into the muscle as needed for  anaphylaxis. As needed for life-threatening allergic reactions Alfonse Spruce, MD Taking Active Self  famotidine (PEPCID) 40 MG tablet [id number]  TAKE 1 TABLET(40 MG) BY MOUTH DAILY Plotnikov, Georgina Quint, MD  Active   flecainide (TAMBOCOR) 50 MG tablet [id number] No Take 1.5 tablets (75 mg total) by mouth 2 (two) times daily. Pricilla Riffle, MD Taking Active   furosemide (LASIX) 40 MG tablet [id number] No Take 1 tablet (40 mg total) by mouth daily.  Patient taking differently: Take 40 mg by mouth as needed.   Nahser, Deloris Ping, MD Taking Active   hydroxychloroquine (PLAQUENIL) 200 MG tablet [id number] No Take 400 mg by mouth every morning. [provider] Taking Active Self  metoprolol tartrate (LOPRESSOR) 25 MG tablet [id number]  TAKE 1 TABLET BY MOUTH THREE TIMES DAILY CAN TAKE ADDITIONAL TABLET AS NEEDED FOR PALPITATIONS Pricilla Riffle, MD  Active   potassium chloride (KLOR-CON) 10 MEQ tablet [id number] No Take 2 tablets by mouth daily Nahser, Deloris Ping, MD Taking Active   rosuvastatin (CRESTOR) 5 MG tablet [id number] No TAKE 1 TABLET(5 MG) BY MOUTH DAILY Plotnikov, Georgina Quint, MD Taking Active   sertraline (ZOLOFT) 50 MG tablet [id number]  TAKE 1 TABLET BY MOUTH DAILY Plotnikov, Georgina Quint, MD  Active             Home Care and Equipment/Supplies: Were Home Health Services Ordered?: NA Any new equipment or medical supplies ordered?: NA  Functional Questionnaire: Do you need assistance with bathing/showering or dressing?: No Do you need assistance with meal preparation?: No Do you need assistance with eating?: No Do you have difficulty maintaining continence: No Do you need  assistance with getting out of bed/getting out of a chair/moving?: No Do you have difficulty managing or taking your medications?: No  Follow up appointments reviewed: PCP Follow-up appointment confirmed?: No (declined appt) MD Provider Line Number:[phone] Given: No Specialist Hospital Follow-up  appointment confirmed?: No Reason Specialist Follow-Up Not Confirmed: Patient has Specialist Provider Number and will Call for Appointment Do you need transportation to your follow-up appointment?: No Do you understand care options if your condition(s) worsen?: Yes-patient verbalized understanding    SIGNATURE  Karena Addison, LPN [facility] Nurse Health Advisor Direct Dial [phone]

## 2023-09-24 ENCOUNTER — Encounter: Payer: Self-pay | Admitting: Internal Medicine

## 2023-09-24 MED ORDER — AMLODIPINE BESYLATE 2.5 MG PO TABS: 2.5000 mg | ORAL_TABLET | Freq: Every evening | ORAL | Status: DC

## 2023-09-24 NOTE — Telephone Encounter (Signed)
Noted! Thank you

## 2023-09-24 NOTE — Telephone Encounter (Signed)
Please call patient and let her know [phone]

## 2023-09-24 NOTE — Telephone Encounter (Signed)
Spoke to patient   She was feeling bad  Took BP one morning   IT wa 180 Recomm she move amlodipine to night time     Keep on metoprolol

## 2023-09-25 ENCOUNTER — Encounter (HOSPITAL_BASED_OUTPATIENT_CLINIC_OR_DEPARTMENT_OTHER): Payer: Self-pay | Admitting: Physical Therapy

## 2023-09-25 ENCOUNTER — Ambulatory Visit (HOSPITAL_COMMUNITY)
Admission: RE | Admit: 2023-09-25 | Discharge: 2023-09-25 | Disposition: A | Discharge status: Home / Self Care | Payer: Medicare Other | Source: Ambulatory Visit | Attending: Cardiology | Admitting: Cardiology

## 2023-09-25 DIAGNOSIS — I4891 Unspecified atrial fibrillation: Secondary | ICD-10-CM | POA: Diagnosis not present

## 2023-09-25 DIAGNOSIS — R0609 Other forms of dyspnea: Secondary | ICD-10-CM

## 2023-09-25 DIAGNOSIS — R600 Localized edema: Secondary | ICD-10-CM | POA: Diagnosis not present

## 2023-09-25 LAB — ECHOCARDIOGRAM LIMITED: S' Lateral: 3.28 cm

## 2023-09-26 ENCOUNTER — Encounter (HOSPITAL_BASED_OUTPATIENT_CLINIC_OR_DEPARTMENT_OTHER): Payer: Self-pay

## 2023-09-26 ENCOUNTER — Ambulatory Visit (HOSPITAL_BASED_OUTPATIENT_CLINIC_OR_DEPARTMENT_OTHER): Payer: Medicare Other

## 2023-09-26 DIAGNOSIS — M25612 Stiffness of left shoulder, not elsewhere classified: Secondary | ICD-10-CM | POA: Diagnosis not present

## 2023-09-26 DIAGNOSIS — M25662 Stiffness of left knee, not elsewhere classified: Secondary | ICD-10-CM | POA: Diagnosis not present

## 2023-09-26 DIAGNOSIS — M25562 Pain in left knee: Secondary | ICD-10-CM

## 2023-09-26 DIAGNOSIS — M6281 Muscle weakness (generalized): Secondary | ICD-10-CM

## 2023-09-26 DIAGNOSIS — M25511 Pain in right shoulder: Secondary | ICD-10-CM

## 2023-09-26 DIAGNOSIS — M25512 Pain in left shoulder: Secondary | ICD-10-CM | POA: Diagnosis not present
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
## 2023-09-27 ENCOUNTER — Encounter: Payer: Self-pay | Admitting: Internal Medicine

## 2023-09-30 NOTE — Telephone Encounter (Signed)
Agree   Stay hydrated

## 2023-10-03 ENCOUNTER — Encounter (HOSPITAL_BASED_OUTPATIENT_CLINIC_OR_DEPARTMENT_OTHER): Payer: Self-pay

## 2023-10-03 ENCOUNTER — Ambulatory Visit (HOSPITAL_BASED_OUTPATIENT_CLINIC_OR_DEPARTMENT_OTHER): Payer: Medicare Other

## 2023-10-03 DIAGNOSIS — M25512 Pain in left shoulder: Secondary | ICD-10-CM | POA: Diagnosis not present

## 2023-10-03 DIAGNOSIS — M25511 Pain in right shoulder: Secondary | ICD-10-CM

## 2023-10-03 DIAGNOSIS — M25662 Stiffness of left knee, not elsewhere classified: Secondary | ICD-10-CM

## 2023-10-03 DIAGNOSIS — M25562 Pain in left knee: Secondary | ICD-10-CM

## 2023-10-03 DIAGNOSIS — M6281 Muscle weakness (generalized): Secondary | ICD-10-CM | POA: Diagnosis not present

## 2023-10-03 DIAGNOSIS — M25612 Stiffness of left shoulder, not elsewhere classified: Secondary | ICD-10-CM | POA: Diagnosis not present
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
## 2023-10-11 ENCOUNTER — Ambulatory Visit (HOSPITAL_BASED_OUTPATIENT_CLINIC_OR_DEPARTMENT_OTHER): Payer: Medicare Other | Admitting: Physical Therapy

## 2023-10-11 ENCOUNTER — Encounter: Payer: Self-pay | Admitting: Internal Medicine

## 2023-10-11 ENCOUNTER — Encounter (HOSPITAL_BASED_OUTPATIENT_CLINIC_OR_DEPARTMENT_OTHER): Payer: Self-pay

## 2023-10-15 DIAGNOSIS — J209 Acute bronchitis, unspecified: Secondary | ICD-10-CM | POA: Diagnosis not present

## 2023-10-18 ENCOUNTER — Ambulatory Visit: Payer: Medicare Other | Admitting: Family Medicine

## 2023-10-21 DIAGNOSIS — H00012 Hordeolum externum right lower eyelid: Secondary | ICD-10-CM | POA: Diagnosis not present

## 2023-10-26 ENCOUNTER — Emergency Department (HOSPITAL_COMMUNITY)
Admission: EM | Admit: 2023-10-26 | Discharge: 2023-10-27 | Disposition: A | Discharge status: Home / Self Care | Payer: Medicare Other | Attending: Emergency Medicine | Admitting: Emergency Medicine

## 2023-10-26 ENCOUNTER — Telehealth: Payer: Self-pay | Admitting: Internal Medicine

## 2023-10-26 DIAGNOSIS — R002 Palpitations: Secondary | ICD-10-CM | POA: Insufficient documentation

## 2023-10-26 DIAGNOSIS — R739 Hyperglycemia, unspecified: Secondary | ICD-10-CM

## 2023-10-26 DIAGNOSIS — I480 Paroxysmal atrial fibrillation: Secondary | ICD-10-CM | POA: Diagnosis not present

## 2023-10-26 DIAGNOSIS — Z7901 Long term (current) use of anticoagulants: Secondary | ICD-10-CM | POA: Insufficient documentation

## 2023-10-26 DIAGNOSIS — R7309 Other abnormal glucose: Secondary | ICD-10-CM | POA: Insufficient documentation

## 2023-10-26 DIAGNOSIS — Z8679 Personal history of other diseases of the circulatory system: Secondary | ICD-10-CM

## 2023-10-26 NOTE — Telephone Encounter (Signed)
 Pt called reporting palpitations with HR in the 120s, she most recently took her scheduled metoprolol  tartrate 25 mg 40 minutes ago without improvement. Besides for palpitations no other notable symptoms. BP 150/102, HR 120s. Recommended taking additional PRN metoprolol  25 mg, if symptoms still not improved after 30 minutes - 1 hour recommend evaluation in ED.

## 2023-10-27 ENCOUNTER — Other Ambulatory Visit: Payer: Self-pay

## 2023-10-27 ENCOUNTER — Encounter (HOSPITAL_COMMUNITY): Payer: Self-pay | Admitting: *Deleted

## 2023-10-27 DIAGNOSIS — R002 Palpitations: Secondary | ICD-10-CM | POA: Diagnosis not present

## 2023-10-27 LAB — CBC
HCT: 37.2 % (ref 36.0–46.0)
Hemoglobin: 12.3 g/dL (ref 12.0–15.0)
MCH: 33.2 pg (ref 26.0–34.0)
MCHC: 33.1 g/dL (ref 30.0–36.0)
MCV: 100.3 fL — ABNORMAL HIGH (ref 80.0–100.0)
Platelets: 201 10*3/uL (ref 150–400)
RBC: 3.71 MIL/uL — ABNORMAL LOW (ref 3.87–5.11)
RDW: 14.1 % (ref 11.5–15.5)
WBC: 5.9 10*3/uL (ref 4.0–10.5)
nRBC: 0 % (ref 0.0–0.2)

## 2023-10-27 LAB — BASIC METABOLIC PANEL
Anion gap: 9 (ref 5–15)
BUN: 12 mg/dL (ref 8–23)
CO2: 25 mmol/L (ref 22–32)
Calcium: 9.6 mg/dL (ref 8.9–10.3)
Chloride: 101 mmol/L (ref 98–111)
Creatinine, Ser: 0.88 mg/dL (ref 0.44–1.00)
GFR, Estimated: >60 mL/min (ref >60)
Glucose, Bld: 105 mg/dL — ABNORMAL HIGH (ref 70–99)
Potassium: 4.7 mmol/L (ref 3.5–5.1)
Sodium: 135 mmol/L (ref 135–145)

## 2023-10-27 LAB — TROPONIN I (HIGH SENSITIVITY): Troponin I (High Sensitivity): 8 ng/L (ref <18)

## 2023-10-27 NOTE — ED Provider Notes (Signed)
 [place] EMERGENCY DEPARTMENT AT [facility] Provider Note   CSN: [id number] Arrival date & time: 10/26/23  2345     History  Chief Complaint  Patient presents with   Atrial Fibrillation    Amanda Wilkins is a 78 y.o. female.  The history is provided by the patient.  Atrial Fibrillation  She has history of hyperlipidemia, rheumatoid arthritis, atrial fibrillation anticoagulated on apixaban  and comes in because she has been in atrial fibrillation since 8 PM.  She states that she feels a little stuffy and did feel that her heart was beating irregularly, and her watch said that she was in atrial fibrillation.  She denies chest pain, heaviness, tightness, pressure.  She denies dyspnea, nausea, vomiting, diaphoresis.  She has been compliant with her medications.   Home Medications Prior to Admission medications   Medication Sig Start Date End Date Taking? Authorizing Provider  acetaminophen  (TYLENOL ) 500 MG tablet Take 1,000 mg by mouth daily as needed for moderate pain or mild pain (pain).    [provider]  ALPRAZolam  (XANAX ) 0.25 MG tablet Take 1 tablet (0.25 mg total) by mouth 2 (two) times daily as needed for anxiety. 07/18/22   Plotnikov, Aleksei V, MD  amLODipine  (NORVASC ) 2.5 MG tablet Take 1 tablet (2.5 mg total) by mouth every evening. 09/24/23   Okey Vina [name], MD  apixaban  (ELIQUIS ) 5 MG TABS tablet TAKE 1 TABLET(5 MG) BY MOUTH TWICE DAILY 09/17/23   Okey Vina [name], MD  cholecalciferol  (VITAMIN D3) 25 MCG (1000 UT) tablet Take 1,000 Units by mouth daily.    [provider]  EPINEPHrine  0.3 mg/0.3 mL IJ SOAJ injection Inject 0.3 mg into the muscle as needed for anaphylaxis. As needed for life-threatening allergic reactions 04/02/22   Iva Marty Saltness, MD  famotidine  (PEPCID ) 40 MG tablet TAKE 1 TABLET(40 MG) BY MOUTH DAILY 07/29/23   Plotnikov, Aleksei V, MD  flecainide  (TAMBOCOR ) 50 MG tablet Take 1.5 tablets (75 mg total) by mouth 2 (two)  times daily. 11/06/22   Okey Vina [name], MD  furosemide  (LASIX ) 40 MG tablet Take 1 tablet (40 mg total) by mouth daily. Patient taking differently: Take 40 mg by mouth as needed. 07/19/23 10/17/23  Nahser, Aleene PARAS, MD  hydroxychloroquine  (PLAQUENIL ) 200 MG tablet Take 400 mg by mouth every morning.    [provider]  metoprolol  tartrate (LOPRESSOR ) 25 MG tablet TAKE 1 TABLET BY MOUTH THREE TIMES DAILY CAN TAKE ADDITIONAL TABLET AS NEEDED FOR PALPITATIONS 09/09/23   Okey Vina [name], MD  potassium chloride  (KLOR-CON ) 10 MEQ tablet Take 2 tablets by mouth daily 07/19/23   Nahser, Aleene PARAS, MD  rosuvastatin  (CRESTOR ) 5 MG tablet TAKE 1 TABLET(5 MG) BY MOUTH DAILY 06/13/23   Plotnikov, Karlynn [name], MD  sertraline  (ZOLOFT ) 50 MG tablet TAKE 1 TABLET BY MOUTH DAILY 07/29/23   Plotnikov, Aleksei V, MD      Allergies    Epinephrine  base, Aspirin , Benadryl  [diphenhydramine ], Covid-19 ad26 vaccine(janssen), Fish allergy , Hylan g-f 20, Penicillins, Tape, and Wound dressing adhesive    Review of Systems   Review of Systems  All other systems reviewed and are negative.   Physical Exam Updated Vital Signs BP (!) 158/93 (BP Location: Right Arm)   Pulse 64   Temp 98.3 F (36.8 C)   Resp 20   Ht 5' 5 (1.651 m)   Wt 106.6 kg   SpO2 97%   BMI 39.11 kg/m  Physical Exam Vitals and nursing note reviewed.  78 year old female, resting comfortably and in no acute distress. Vital signs are significant for elevated blood pressure. Oxygen  saturation is 97%, which is normal. Head is normocephalic and atraumatic. PERRLA, EOMI. Oropharynx is clear. Neck is nontender and supple without adenopathy or JVD. Lungs are clear without rales, wheezes, or rhonchi. Chest is nontender. Heart has an irregular rhythm without murmur. Abdomen is soft, flat, nontender. Extremities have no cyanosis or edema, full range of motion is present. Skin is warm and dry without rash. Neurologic: Mental status is normal, cranial  nerves are intact, moves all extremities equally.  ED Results / Procedures / Treatments   Labs (all labs ordered are listed, but only abnormal results are displayed) Labs Reviewed  BASIC METABOLIC PANEL - Abnormal; Notable for the following components:      Result Value   Glucose, Bld 105 (*)    All other components within normal limits  CBC - Abnormal; Notable for the following components:   RBC 3.71 (*)    MCV 100.3 (*)    All other components within normal limits  TROPONIN I (HIGH SENSITIVITY)    EKG EKG Interpretation Date/Time:  Sunday October 27 2023 00:04:40 EST Ventricular Rate:  90 PR Interval:    QRS Duration:  92 QT Interval:  354 QTC Calculation: 433 R Axis:   65  Text Interpretation: Sinus rhythm Premature atrial complexes First degree A-V block Abnormal ECG When compared with ECG of 22-Sep-2023 09:35, No significant change was found Confirmed by Raford Lenis ([id number]) on 10/27/2023 12:25:57 AM   EKG Interpretation Date/Time:  Sunday October 27 2023 00:51:16 EST Ventricular Rate:  99 PR Interval:    QRS Duration:  101 QT Interval:  376 QTC Calculation: 483 R Axis:   81  Text Interpretation: Undetermined rhythm Borderline right axis deviation Low voltage, precordial leads When compared with ECG of EARLIER SAME DATE No significant change was found Reconfirmed by Raford Lenis ([id number]) on 10/27/2023 1:42:45 AM        EKG Interpretation Date/Time:  Sunday October 27 2023 01:13:20 EST Ventricular Rate:  65 PR Interval:  216 QRS Duration:  94 QT Interval:  414 QTC Calculation: 430 R Axis:   62  Text Interpretation: Sinus rhythm with 1st degree A-V block Low voltage QRS Borderline ECG When compared with ECG of 27-Oct-2023 00:51, Premature atrial complexes are no longer present Confirmed by Raford Lenis ([id number]) on 10/27/2023 1:43:44 AM       Radiology No results found.  Procedures Procedures  Cardiac monitor shows an irregular rhythm but with P waves  frequently present, unclear whether this is atrial fibrillation or sinus rhythm with frequent PACs.  This is per my interpretation.  Medications Ordered in ED Medications - No data to display  ED Course/ Medical Decision Making/ A&P                                 Medical Decision Making Amount and/or Complexity of Data Reviewed Labs: ordered.   Palpitations with concern for recurrent atrial fibrillation.  I have reviewed her laboratory tests, and my interpretation is elevated random glucose which will need to be followed as an outpatient and otherwise normal basic metabolic panel, normal troponin, normal CBC.  I have reviewed her electrocardiogram, and my interpretation is sinus rhythm with first-degree AV block and frequent PACs.  Please note that computer reading of this was atrial fibrillation.  P waves are very low  amplitude and difficult to discern.  I had the ECG repeated, and my interpretation was baseline artifact which made determination of P waves difficult but this did appear more like atrial fibrillation than her initial ECG.  I had the ECG repeated and my interpretation of the 30 ECG is clearly sinus rhythm without any ectopy.  It is certainly possible that she is going in and out of atrial fibrillation, but there is no indication for electrical cardioversion.  She may need adjustment of her flecainide  and/or metoprolol  doses.  I am referring her back to her cardiologist to discuss this.  Final Clinical Impression(s) / ED Diagnoses Final diagnoses:  Palpitations  History of atrial fibrillation  Elevated random blood glucose level  Chronic anticoagulation    Rx / DC Orders ED Discharge Orders     None         Raford Lenis, MD 10/27/23 [id number]

## 2023-10-27 NOTE — ED Triage Notes (Signed)
 Atrial fib since 2000 last pm  she missed  her af med about  2 hours she has taken extra meds per suggestion of her doctor when she called him

## 2023-10-27 NOTE — ED Notes (Signed)
 Provider bedside.

## 2023-10-27 NOTE — ED Notes (Signed)
 Assumed care of pt, found her in the room alert and oriented having just returned from the bathroom.  She denies any pain and reports "a feeling in my chest I can't explain."  Pt denies any other issues at this time.

## 2023-10-27 NOTE — Discharge Instructions (Addendum)
 Your electrocardiogram does not show that you are in atrial fibrillation, but I cannot be sure that you have not been going in and out of atrial fibrillation.  Please discuss with your cardiologist whether you should adjust any of your medications.  At any point, if you have any concerns about your heart rhythm, please return to the emergency department for further evaluation.

## 2023-10-28 ENCOUNTER — Other Ambulatory Visit: Payer: Self-pay | Admitting: Internal Medicine

## 2023-10-28 ENCOUNTER — Encounter (HOSPITAL_BASED_OUTPATIENT_CLINIC_OR_DEPARTMENT_OTHER): Payer: Self-pay | Admitting: Physical Therapy

## 2023-10-28 ENCOUNTER — Ambulatory Visit (HOSPITAL_BASED_OUTPATIENT_CLINIC_OR_DEPARTMENT_OTHER): Payer: Medicare Other | Attending: Orthopaedic Surgery | Admitting: Physical Therapy

## 2023-10-28 DIAGNOSIS — M25512 Pain in left shoulder: Secondary | ICD-10-CM | POA: Diagnosis not present

## 2023-10-28 DIAGNOSIS — M25562 Pain in left knee: Secondary | ICD-10-CM

## 2023-10-28 DIAGNOSIS — M25612 Stiffness of left shoulder, not elsewhere classified: Secondary | ICD-10-CM

## 2023-10-28 DIAGNOSIS — M25511 Pain in right shoulder: Secondary | ICD-10-CM

## 2023-10-28 DIAGNOSIS — M25662 Stiffness of left knee, not elsewhere classified: Secondary | ICD-10-CM

## 2023-10-28 DIAGNOSIS — M6281 Muscle weakness (generalized): Secondary | ICD-10-CM | POA: Diagnosis not present
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
## 2023-10-29 NOTE — Telephone Encounter (Signed)
 Pt has APP appt 11/01/23. Made before she left the ED. Per Dr Tenny Craw pt should follow up with the Afib clinic thereafter.

## 2023-10-30 ENCOUNTER — Ambulatory Visit (INDEPENDENT_AMBULATORY_CARE_PROVIDER_SITE_OTHER): Payer: Medicare Other

## 2023-10-30 ENCOUNTER — Ambulatory Visit: Payer: Medicare Other | Admitting: Internal Medicine

## 2023-10-30 ENCOUNTER — Encounter: Payer: Self-pay | Admitting: Internal Medicine

## 2023-10-30 VITALS — BP 130/70 | HR 65 | Temp 98.1°F | Ht 65.0 in | Wt 242.0 lb

## 2023-10-30 DIAGNOSIS — R609 Edema, unspecified: Secondary | ICD-10-CM | POA: Insufficient documentation

## 2023-10-30 DIAGNOSIS — R635 Abnormal weight gain: Secondary | ICD-10-CM

## 2023-10-30 DIAGNOSIS — R002 Palpitations: Secondary | ICD-10-CM

## 2023-10-30 DIAGNOSIS — R052 Subacute cough: Secondary | ICD-10-CM

## 2023-10-30 DIAGNOSIS — R059 Cough, unspecified: Secondary | ICD-10-CM | POA: Diagnosis not present

## 2023-10-30 DIAGNOSIS — R718 Other abnormality of red blood cells: Secondary | ICD-10-CM

## 2023-10-30 DIAGNOSIS — I4819 Other persistent atrial fibrillation: Secondary | ICD-10-CM | POA: Diagnosis not present

## 2023-10-30 DIAGNOSIS — F4323 Adjustment disorder with mixed anxiety and depressed mood: Secondary | ICD-10-CM | POA: Diagnosis not present

## 2023-10-30 DIAGNOSIS — I10 Essential (primary) hypertension: Secondary | ICD-10-CM | POA: Diagnosis not present

## 2023-10-30 DIAGNOSIS — R739 Hyperglycemia, unspecified: Secondary | ICD-10-CM

## 2023-10-30 DIAGNOSIS — I700 Atherosclerosis of aorta: Secondary | ICD-10-CM | POA: Diagnosis not present

## 2023-10-30 DIAGNOSIS — R0609 Other forms of dyspnea: Secondary | ICD-10-CM

## 2023-10-30 LAB — HEMOGLOBIN A1C: Hgb A1c MFr Bld: 5.7 % (ref 4.6–6.5)

## 2023-10-30 LAB — COMPREHENSIVE METABOLIC PANEL
ALT: 23 U/L (ref 0–35)
AST: 21 U/L (ref 0–37)
Albumin: 4.8 g/dL (ref 3.5–5.2)
Alkaline Phosphatase: 71 U/L (ref 39–117)
BUN: 15 mg/dL (ref 6–23)
CO2: 23 meq/L (ref 19–32)
Calcium: 9.8 mg/dL (ref 8.4–10.5)
Chloride: 99 meq/L (ref 96–112)
Creatinine, Ser: 0.82 mg/dL (ref 0.40–1.20)
GFR: 69.25 mL/min (ref >60.00)
Glucose, Bld: 94 mg/dL (ref 70–99)
Potassium: 4.1 meq/L (ref 3.5–5.1)
Sodium: 133 meq/L — ABNORMAL LOW (ref 135–145)
Total Bilirubin: 1.1 mg/dL (ref 0.2–1.2)
Total Protein: 7.5 g/dL (ref 6.0–8.3)

## 2023-10-30 LAB — CBC WITH DIFFERENTIAL/PLATELET
Basophils Absolute: 0 10*3/uL (ref 0.0–0.1)
Basophils Relative: 1 % (ref 0.0–3.0)
Eosinophils Absolute: 0.1 10*3/uL (ref 0.0–0.7)
Eosinophils Relative: 2.2 % (ref 0.0–5.0)
HCT: 39.9 % (ref 36.0–46.0)
Hemoglobin: 13.3 g/dL (ref 12.0–15.0)
Lymphocytes Relative: 22.8 % (ref 12.0–46.0)
Lymphs Abs: 1.1 10*3/uL (ref 0.7–4.0)
MCHC: 33.3 g/dL (ref 30.0–36.0)
MCV: 100.5 fL — ABNORMAL HIGH (ref 78.0–100.0)
Monocytes Absolute: 0.3 10*3/uL (ref 0.1–1.0)
Monocytes Relative: 5.5 % (ref 3.0–12.0)
Neutro Abs: 3.3 10*3/uL (ref 1.4–7.7)
Neutrophils Relative %: 68.5 % (ref 43.0–77.0)
Platelets: 198 10*3/uL (ref 150.0–400.0)
RBC: 3.97 Mil/uL (ref 3.87–5.11)
RDW: 15 % (ref 11.5–15.5)
WBC: 4.8 10*3/uL (ref 4.0–10.5)

## 2023-10-30 LAB — VITAMIN B12: Vitamin B-12: 331 pg/mL (ref 211–911)

## 2023-10-30 MED ORDER — ROSUVASTATIN CALCIUM 5 MG PO TABS: 5.0000 mg | ORAL_TABLET | Freq: Every day | ORAL | 3 refills | Status: DC

## 2023-10-30 MED ORDER — DOXYCYCLINE HYCLATE 100 MG PO TABS: 100.0000 mg | ORAL_TABLET | Freq: Two times a day (BID) | ORAL | 0 refills | Status: DC

## 2023-10-30 MED ORDER — ALPRAZOLAM 0.25 MG PO TABS: 0.2500 mg | ORAL_TABLET | Freq: Two times a day (BID) | ORAL | 3 refills | Status: AC | PRN

## 2023-10-30 MED ORDER — OZEMPIC (0.25 OR 0.5 MG/DOSE) 2 MG/3ML ~~LOC~~ SOPN: PEN_INJECTOR | SUBCUTANEOUS | 1 refills | Status: DC

## 2023-10-30 MED ORDER — HYDROCODONE BIT-HOMATROP MBR 5-1.5 MG/5ML PO SOLN: 5.0000 mL | Freq: Four times a day (QID) | ORAL | 0 refills | Status: DC | PRN

## 2023-10-30 NOTE — Assessment & Plan Note (Signed)
 Stress w Helena Loach discussed On Zoloft    Potential benefits of a long term benzodiazepines  use as well as potential risks  and complications were explained to the patient and were aknowledged. Psychology f/u

## 2023-10-30 NOTE — Assessment & Plan Note (Signed)
 Due to subacute bronchitis, persistent cough.  Treat bronchitis with doxycycline .  Hycodan cough syrup as needed.  Obtain chest x-ray Albuterol  intolerant due to palpitations

## 2023-10-30 NOTE — Assessment & Plan Note (Signed)
 Recurrent.  No alcohol use at all.  Obtain lab work.  Will watch

## 2023-10-30 NOTE — Assessment & Plan Note (Signed)
 Due to subacute bronchitis.  Treat bronchitis with doxycycline .  Hycodan cough syrup as needed.  Obtain chest x-ray Albuterol  intolerant due to palpitations

## 2023-10-30 NOTE — Assessment & Plan Note (Signed)
On Atenolol 

## 2023-10-30 NOTE — Progress Notes (Signed)
 Subjective:  Patient ID: Amanda Wilkins, female    DOB: 24-Sep-1946  Age: 78 y.o. MRN: [id number]  CC: Medical Management of Chronic Issues (Cough since 12/24 which is now worse and causing SOB x3 days)   HPI Amanda Wilkins presents for URI sx's x 20 d, severe cough during daytime shortness of breath on exertion H/o recent A fib, HTN C/o leg swelling -chronic, off amlodipine  completely for over 4-6 weeks Status post recent ER visit for palpitations/A-fib Complains of weight gain, sweets cravings Amanda Wilkins is complaining of stress/anxiety  Outpatient Medications Prior to Visit  Medication Sig Dispense Refill   acetaminophen  (TYLENOL ) 500 MG tablet Take 1,000 mg by mouth daily as needed for moderate pain or mild pain (pain).     apixaban  (ELIQUIS ) 5 MG TABS tablet TAKE 1 TABLET(5 MG) BY MOUTH TWICE DAILY 60 tablet 5   cholecalciferol  (VITAMIN D3) 25 MCG (1000 UT) tablet Take 1,000 Units by mouth daily.     EPINEPHrine  0.3 mg/0.3 mL IJ SOAJ injection Inject 0.3 mg into the muscle as needed for anaphylaxis. As needed for life-threatening allergic reactions 2 each 1   flecainide  (TAMBOCOR ) 50 MG tablet TAKE 1 AND 1/2 TABLETS(75 MG) BY MOUTH TWICE DAILY 270 tablet 3   hydroxychloroquine  (PLAQUENIL ) 200 MG tablet Take 400 mg by mouth every morning.     metoprolol  tartrate (LOPRESSOR ) 25 MG tablet TAKE 1 TABLET BY MOUTH THREE TIMES DAILY CAN TAKE ADDITIONAL TABLET AS NEEDED FOR PALPITATIONS 90 tablet 3   potassium chloride  (KLOR-CON ) 10 MEQ tablet Take 2 tablets by mouth daily 180 tablet 3   sertraline  (ZOLOFT ) 50 MG tablet TAKE 1 TABLET BY MOUTH DAILY 90 tablet 1   ALPRAZolam  (XANAX ) 0.25 MG tablet Take 1 tablet (0.25 mg total) by mouth 2 (two) times daily as needed for anxiety. 60 tablet 3   amLODipine  (NORVASC ) 2.5 MG tablet Take 1 tablet (2.5 mg total) by mouth every evening.     rosuvastatin  (CRESTOR ) 5 MG tablet TAKE 1 TABLET(5 MG) BY MOUTH DAILY 90 tablet 0   furosemide  (LASIX )  40 MG tablet Take 1 tablet (40 mg total) by mouth daily. (Patient taking differently: Take 40 mg by mouth as needed.) 90 tablet 3   famotidine  (PEPCID ) 40 MG tablet TAKE 1 TABLET(40 MG) BY MOUTH DAILY 90 tablet 3   No facility-administered medications prior to visit.    ROS: Review of Systems  Constitutional:  Positive for unexpected weight change. Negative for activity change, appetite change, chills and fatigue.  HENT:  Negative for congestion, mouth sores and sinus pressure.   Eyes:  Negative for visual disturbance.  Respiratory:  Positive for cough and shortness of breath. Negative for chest tightness.   Cardiovascular:  Positive for palpitations and leg swelling.  Gastrointestinal:  Negative for abdominal pain and nausea.  Genitourinary:  Negative for difficulty urinating, frequency and vaginal pain.  Musculoskeletal:  Negative for back pain and gait problem.  Skin:  Negative for pallor and rash.  Neurological:  Negative for dizziness, tremors, weakness, numbness and headaches.  Psychiatric/Behavioral:  Positive for dysphoric mood. Negative for confusion, sleep disturbance and suicidal ideas. The patient is nervous/anxious.     Objective:  BP 130/70 (BP Location: Left Arm, Patient Position: Sitting, Cuff Size: Normal)   Pulse 65   Temp 98.1 F (36.7 C) (Oral)   Ht 5\' 5"  (1.651 m)   Wt 242 lb (109.8 kg)   SpO2 98%   BMI 40.27 kg/m   BP Readings from Last  3 Encounters:  10/30/23 130/70  10/27/23 (!) 150/80  09/22/23 (!) 150/79    Wt Readings from Last 3 Encounters:  10/30/23 242 lb (109.8 kg)  10/27/23 235 lb 0.2 oz (106.6 kg)  09/22/23 235 lb (106.6 kg)    Physical Exam Constitutional:      General: She is not in acute distress.    Appearance: She is well-developed. She is obese. She is not toxic-appearing.  HENT:     Head: Normocephalic.     Right Ear: External ear normal.     Left Ear: External ear normal.     Nose: Nose normal.  Eyes:     General:         Right eye: No discharge.        Left eye: No discharge.     Conjunctiva/sclera: Conjunctivae normal.     Pupils: Pupils are equal, round, and reactive to light.  Neck:     Thyroid : No thyromegaly.     Vascular: No JVD.     Trachea: No tracheal deviation.  Cardiovascular:     Rate and Rhythm: Normal rate and regular rhythm.     Heart sounds: Normal heart sounds.  Pulmonary:     Effort: No respiratory distress.     Breath sounds: No stridor. No wheezing.  Abdominal:     General: Bowel sounds are normal. There is no distension.     Palpations: Abdomen is soft. There is no mass.     Tenderness: There is no abdominal tenderness. There is no guarding or rebound.  Musculoskeletal:        General: No swelling or tenderness.     Cervical back: Normal range of motion and neck supple. No rigidity.     Right lower leg: Edema present.     Left lower leg: Edema present.  Lymphadenopathy:     Cervical: No cervical adenopathy.  Skin:    Findings: No erythema or rash.  Neurological:     Mental Status: She is oriented to person, place, and time.     Cranial Nerves: No cranial nerve deficit.     Motor: No abnormal muscle tone.     Coordination: Coordination normal.     Deep Tendon Reflexes: Reflexes normal.  Psychiatric:        Behavior: Behavior normal.        Thought Content: Thought content normal.        Judgment: Judgment normal.   Both legs with trace edema Coughing all the time    A total time of 45 minutes was spent preparing to see the patient, reviewing tests, x-rays, operative reports and other medical records.  Also, obtaining history and performing comprehensive physical exam.  Additionally, counseling the patient regarding the above listed issues - cough, A fib, obesity.   Finally, documenting clinical information in the health records, coordination of care, educating the patient. It is a complex case.    Lab Results  Component Value Date   WBC 5.9 10/27/2023   HGB 12.3  10/27/2023   HCT 37.2 10/27/2023   PLT 201 10/27/2023   GLUCOSE 105 (H) 10/27/2023   CHOL 168 03/06/2022   TRIG 52 03/06/2022   HDL 98 03/06/2022   LDLCALC 59 03/06/2022   ALT 81 (H) 01/18/2023   AST 58 (H) 01/18/2023   NA 135 10/27/2023   K 4.7 10/27/2023   CL 101 10/27/2023   CREATININE 0.88 10/27/2023   BUN 12 10/27/2023   CO2 25 10/27/2023  TSH 3.300 02/26/2023   INR 1.1 07/07/2022   HGBA1C 5.6 02/26/2023    No results found.  Assessment & Plan:   Problem List Items Addressed This Visit     RESOLVED: Adjustment disorder with mixed anxiety and depressed mood   Stress w Amanda Wilkins discussed On Zoloft    Potential benefits of a long term benzodiazepines  use as well as potential risks  and complications were explained to the patient and were aknowledged. Psychology f/u      Palpitations   PAF In NSR now Continue with Eliquis       Cough   Due to subacute bronchitis.  Treat bronchitis with doxycycline .  Hycodan cough syrup as needed.  Obtain chest x-ray Albuterol  intolerant due to palpitations      Weight gain   Discussed.  Semaglutide  is expensive -prescription provided to look for less expensive options.  Phentermine is not an option due to palpitations.  Intermittent fasting to continue      Elevated MCV   Recurrent.  No alcohol use at all.  Obtain lab work.  Will watch      Relevant Orders   Vitamin B12   Dyspnea on exertion - Primary   Due to subacute bronchitis, persistent cough.  Treat bronchitis with doxycycline .  Hycodan cough syrup as needed.  Obtain chest x-ray Albuterol  intolerant due to palpitations      Relevant Orders   DG Chest 2 View   Persistent atrial fibrillation (HCC)   Relevant Medications   rosuvastatin  (CRESTOR ) 5 MG tablet   Other Relevant Orders   CBC with Differential/Platelet   Hyperglycemia   Relevant Orders   Comprehensive metabolic panel   Hemoglobin A1c   Essential hypertension   On Atenolol       Relevant  Medications   rosuvastatin  (CRESTOR ) 5 MG tablet   Edema   Chronic venous insufficiency. Use compression socks.  Continue with weight loss effort.  Off amlodipine          Meds ordered this encounter  Medications   Semaglutide ,0.25 or 0.5MG /DOS, (OZEMPIC , 0.25 OR 0.5 MG/DOSE,) 2 MG/3ML SOPN    Sig: Use 0.25 mg weekly sq for 1 month, then 0.5 mg sq weekly    Dispense:  9 mL    Refill:  1   doxycycline  (VIBRA -TABS) 100 MG tablet    Sig: Take 1 tablet (100 mg total) by mouth 2 (two) times daily.    Dispense:  20 tablet    Refill:  0   HYDROcodone  bit-homatropine (HYCODAN) 5-1.5 MG/5ML syrup    Sig: Take 5 mLs by mouth every 6 (six) hours as needed for cough.    Dispense:  240 mL    Refill:  0   ALPRAZolam  (XANAX ) 0.25 MG tablet    Sig: Take 1 tablet (0.25 mg total) by mouth 2 (two) times daily as needed for anxiety.    Dispense:  60 tablet    Refill:  3   rosuvastatin  (CRESTOR ) 5 MG tablet    Sig: Take 1 tablet (5 mg total) by mouth daily.    Dispense:  90 tablet    Refill:  3      Follow-up: Return in about 3 months (around 01/28/2024) for a follow-up visit.  Anitra Barn, MD

## 2023-10-30 NOTE — Assessment & Plan Note (Signed)
 PAF In NSR now Continue with Eliquis 

## 2023-10-30 NOTE — Assessment & Plan Note (Signed)
 Discussed.  Semaglutide  is expensive -prescription provided to look for less expensive options.  Phentermine is not an option due to palpitations.  Intermittent fasting to continue

## 2023-10-30 NOTE — Assessment & Plan Note (Signed)
 Chronic venous insufficiency. Use compression socks.  Continue with weight loss effort.  Off amlodipine 

## 2023-10-31 ENCOUNTER — Encounter: Payer: Self-pay | Admitting: Internal Medicine

## 2023-11-01 ENCOUNTER — Ambulatory Visit: Payer: Medicare Other | Attending: Cardiology | Admitting: Cardiology

## 2023-11-01 ENCOUNTER — Ambulatory Visit (INDEPENDENT_AMBULATORY_CARE_PROVIDER_SITE_OTHER): Payer: Medicare Other

## 2023-11-01 ENCOUNTER — Encounter: Payer: Self-pay | Admitting: Cardiology

## 2023-11-01 ENCOUNTER — Other Ambulatory Visit: Payer: Self-pay | Admitting: Obstetrics and Gynecology

## 2023-11-01 VITALS — BP 121/62 | HR 60 | Ht 65.0 in | Wt 248.8 lb

## 2023-11-01 DIAGNOSIS — R0609 Other forms of dyspnea: Secondary | ICD-10-CM | POA: Insufficient documentation

## 2023-11-01 DIAGNOSIS — R002 Palpitations: Secondary | ICD-10-CM | POA: Insufficient documentation

## 2023-11-01 DIAGNOSIS — I700 Atherosclerosis of aorta: Secondary | ICD-10-CM | POA: Diagnosis not present

## 2023-11-01 DIAGNOSIS — I480 Paroxysmal atrial fibrillation: Secondary | ICD-10-CM | POA: Diagnosis not present

## 2023-11-01 DIAGNOSIS — I10 Essential (primary) hypertension: Secondary | ICD-10-CM | POA: Insufficient documentation

## 2023-11-01 DIAGNOSIS — Z1231 Encounter for screening mammogram for malignant neoplasm of breast: Secondary | ICD-10-CM

## 2023-11-01 NOTE — Progress Notes (Unsigned)
Applied a 14 day Zio XT monitor to patient in the office  Ross to read

## 2023-11-01 NOTE — Patient Instructions (Addendum)
Medication Instructions:   Your physician recommends that you continue on your current medications as directed. Please refer to the Current Medication list given to you today.   *If you need a refill on your cardiac medications before your next appointment, please call your pharmacy*   Lab Work:  NONE ORDERED  TODAY    If you have labs (blood work) drawn today and your tests are completely normal, you will receive your results only by: MyChart Message (if you have MyChart) OR A paper copy in the mail If you have any lab test that is abnormal or we need to change your treatment, we will call you to review the results.   Testing/Procedures: Your physician has recommended that you wear an event monitor. Event monitors are medical devices that record the heart's electrical activity. Doctors most often [place] these monitors to diagnose arrhythmias. Arrhythmias are problems with the speed or rhythm of the heartbeat. The monitor is a small, portable device. You can wear one while you do your normal daily activities. This is usually used to diagnose what is causing palpitations/syncope (passing out).    Follow-Up: At [facility], you and your health needs are our priority.  As part of our continuing mission to provide you with exceptional heart care, we have created designated Provider Care Teams.  These Care Teams include your primary Cardiologist (physician) and Advanced Practice Providers (APPs -  Physician Assistants and Nurse Practitioners) who all work together to provide you with the care you need, when you need it.  We recommend signing up for the patient portal called "MyChart".  Sign up information is provided on this After Visit Summary.  MyChart is used to connect with patients for Virtual Visits (Telemedicine).  Patients are able to view lab/test results, encounter notes, upcoming appointments, etc.  Non-urgent messages can be sent to your provider as well.   To learn more about  what you can do with MyChart, go to [email or web address].    Your next appointment:      3 -4  month(s)    Provider:     Dietrich Pates, MD      Other Instructions  .ZIO XT- Long Term Monitor Instructions  Your physician has requested you wear a ZIO patch monitor for 14 days.  This is a single patch monitor. [employer] supplies one patch monitor per enrollment. Additional stickers are not available. Please do not apply patch if you will be having a Nuclear Stress Test,  Echocardiogram, Cardiac CT, MRI, or Chest Xray during the period you would be wearing the  monitor. The patch cannot be worn during these tests. You cannot remove and re-apply the  ZIO XT patch monitor.  Your ZIO patch monitor will be mailed 3 day USPS to your address on file. It may take 3-5 days  to receive your monitor after you have been enrolled.  Once you have received your monitor, please review the enclosed instructions. Your monitor  has already been registered assigning a specific monitor serial # to you.  Billing and Patient Assistance Program Information  We have supplied [employer] with any of your insurance information on file for billing purposes. [employer] offers a sliding scale Patient Assistance Program for patients that do not have  insurance, or whose insurance does not completely cover the cost of the ZIO monitor.  You must apply for the Patient Assistance Program to qualify for this discounted rate.  To apply, please call [employer] at [phone], select option 4, select option  2, ask to apply for  Patient Assistance Program. Meredeth Ide will ask your household income, and how many people  are in your household. They will quote your out-of-pocket cost based on that information.  [employer] will also be able to set up a 86-month, interest-free payment plan if needed.  Applying the monitor   Shave hair from upper left chest.  Hold abrader disc by orange tab. Rub abrader in 40 strokes over the upper  left chest as  indicated in your monitor instructions.  Clean area with 4 enclosed alcohol pads. Let dry.  Apply patch as indicated in monitor instructions. Patch will be placed under collarbone on left  side of chest with arrow pointing upward.  Rub patch adhesive wings for 2 minutes. Remove white label marked "1". Remove the white  label marked "2". Rub patch adhesive wings for 2 additional minutes.  While looking in a mirror, press and release button in center of patch. A small green light will  flash 3-4 times. This will be your only indicator that the monitor has been turned on.  Do not shower for the first 24 hours. You may shower after the first 24 hours.  Press the button if you feel a symptom. You will hear a small click. Record Date, Time and  Symptom in the Patient Logbook.  When you are ready to remove the patch, follow instructions on the last 2 pages of Patient  Logbook. Stick patch monitor onto the last page of Patient Logbook.  Place Patient Logbook in the blue and white box. Use locking tab on box and tape box closed  securely. The blue and white box has prepaid postage on it. Please place it in the mailbox as  soon as possible. Your physician should have your test results approximately 7 days after the  monitor has been mailed back to [facility].  Call [facility] Customer Care at [phone] if you have questions regarding  your ZIO XT patch monitor. Call them immediately if you see an orange light blinking on your  monitor.  If your monitor falls off in less than 4 days, contact our Monitor department at [phone].  If your monitor becomes loose or falls off after 4 days call [employer] at [phone] for  suggestions on securing your monitor

## 2023-11-01 NOTE — Progress Notes (Signed)
Cardiology Office Note:   Date:  11/01/2023  ID:  Amanda Wilkins, DOB 1946-02-17, MRN [id number] PCP: Tresa Garter, MD   HeartCare Providers Cardiologist:  Dietrich Pates, MD    History of Present Illness:   Discussed the use of AI scribe software for clinical note transcription with the patient, who gave verbal consent to proceed.  History of Present Illness   The patient is a 78 year old individual with a complex medical history, including paroxysmal atrial flutter and atrial fibrillation managed with Eliquis and Flecainide, hypertension, chronic interstitial nephritis, obstructive sleep apnea managed with CPAP, paroxysmal SVT, mild carotid artery disease, hyperlipidemia, aortic atherosclerosis, mild mitral regurgitation, and chronic lower extremity edema. The patient has undergone multiple cardioversions and per most recent ultrasound has preserved left ventricular ejection fraction, mild left ventricular hypertrophy, grade two diastolic dysfunction, mild enlargement of the right ventricle, mildly elevated pulmonary artery systolic pressure, and moderately dilated left and right atria.  The patient recently visited the emergency department with symptoms of atrial fibrillation/sensation of rapid HR with simultaneous Apple Watch alerts for afib. On personal review of EKG tracings from this visit showed sinus rhythm with PACs and very low amplitude P waves, but no atrial fibrillation. Patient reports feeling conversion to NSR spontaneously in the ED, but is not sure if this occurred before or after EKG tracings. She denies known occurrence of afib since 2022 but does note frequent episodes of "racing heart beat." Per patient, the difference in how she feels when in afib vs SVT is difficult to describe but she is confident in knowing the difference.   Aside from this episode of arrhythmia, patient reported feeling generally okay since her last cardiology office visit. She does  describe chronic exertional dyspnea which she says has occurred for years when she walks up a hill or exerts heavily. She describes developing a cough in December that has lingered since. Per notes, patient saw urgent care and was diagnosed with acute bronchitis. She still has a productive cough and her PCP prescribed doxycycline on 1/15. Patient only took one dose and stopped after developing a rash, has not yet called back to her PCP.   The patient also reported a history of tachycardia, which occurs every couple of months and has been present since she was in her twenties. The tachycardia is often triggered by body movements and lying on the left side.  The patient has a history of high blood pressure, which has been managed with metoprolol. The patient reported that her blood pressure tends to increase during episodes of AFib. The patient has also been taking Lasix occasionally for chronic lower extremity edema, but reported that the swelling does not completely resolve. The patient has not tried wearing compression stockings for the edema.      Studies Reviewed:    EKG:      Multiple ED ECG tracings reviewed. Sinus rhythm with low amplitude P waves, first degree AVB with intermittent PACs.   Risk Assessment/Calculations:    CHA2DS2-VASc Score = 5   This indicates a 7.2% annual risk of stroke. The patient's score is based upon: CHF History: 0 HTN History: 1 Diabetes History: 0 Stroke History: 0 Vascular Disease History: 1 Age Score: 2 Gender Score: 1             Physical Exam:   VS:  BP 121/62   Pulse 60   Ht 5\' 5"  (1.651 m)   Wt 248 lb 12.8 oz (112.9 kg)   SpO2 98%  BMI 41.40 kg/m    Wt Readings from Last 3 Encounters:  11/01/23 248 lb 12.8 oz (112.9 kg)  10/30/23 242 lb (109.8 kg)  10/27/23 235 lb 0.2 oz (106.6 kg)     Physical Exam Vitals reviewed.  Constitutional:      Appearance: Normal appearance.  HENT:     Head: Normocephalic.     Nose: Nose normal.   Eyes:     Pupils: Pupils are equal, round, and reactive to light.  Cardiovascular:     Rate and Rhythm: Normal rate and regular rhythm.     Pulses: Normal pulses.     Heart sounds: Normal heart sounds. No murmur heard.    No friction rub. No gallop.  Pulmonary:     Effort: Pulmonary effort is normal.     Breath sounds: Normal breath sounds.  Abdominal:     General: Abdomen is flat.  Musculoskeletal:     Right lower leg: No edema.     Left lower leg: No edema.  Skin:    General: Skin is warm and dry.     Capillary Refill: Capillary refill takes less than 2 seconds.  Neurological:     General: No focal deficit present.     Mental Status: She is alert and oriented to person, place, and time.  Psychiatric:        Mood and Affect: Mood normal.        Behavior: Behavior normal.        Thought Content: Thought content normal.        Judgment: Judgment normal.     ASSESSMENT AND PLAN:     Assessment and Plan    Atrial Fibrillation/Flutter Patient with recent ED visit for Apple Watch reported episode of Afib. No Afib documented on ED ECGs. These show sinus rhythm with first degree AVB and PACs. Patient admits to stress about cost of a dental procedure and has also been sick with bronchitis. Currently on Metoprolol and Flecainide, endorses compliance.  -Order 14-day Zio to further assess rhythm. Not clear by ED workup whether patient actually had afib or just atrial arrhythmia with PACs.  -Consider referral to electrophysiology team depending on heart monitor results. -Continue Flecainide and Metoprolol.   Chronic Shortness of Breath Long-standing issue, worsened with recent cough since December. Recent bronchitis may be contributing to exacerbation. -Consider pulmonary function testing after resolution of acute bronchitis symptoms. Echocardiogram has not shown significant heart failure.   HFpEF Lower Extremity Edema Chronic issue, not improved with cessation of Amlodipine. No  significant heart failure noted on echocardiogram, LVEF 60-65% on most recent limited echo in December, with grade II DD on January echo.  -Recommend use of compression stockings. -Continue occasional use of Lasix as needed. -Patient previously on Jardiance, stopped due to increased frequency of palpitations. Patient states palpitations less frequent now that she's off. She prefers to avoid SGLT2i medications at this time.   Aortic atherosclerosis Chest imaging with aortic atherosclerosis noted. -Continue lipid management and BP management as below  Hyperlipidemia Well-controlled with Crestor. LDL at goal. -Continue Crestor.  Hypertension Well-controlled with Metoprolol. Previous intolerance to Amlodipine. -Continue Metoprolol.  Follow-up in 3-4 months.              Signed, Perlie Gold, PA-C

## 2023-11-04 ENCOUNTER — Ambulatory Visit: Payer: Medicare Other | Admitting: Internal Medicine

## 2023-11-04 ENCOUNTER — Other Ambulatory Visit: Payer: Self-pay | Admitting: Internal Medicine

## 2023-11-04 MED ORDER — SULFAMETHOXAZOLE-TRIMETHOPRIM 800-160 MG PO TABS
1.0000 | ORAL_TABLET | Freq: Two times a day (BID) | ORAL | 0 refills | Status: DC
Start: 2023-11-04 — End: 2023-12-24

## 2023-11-06 ENCOUNTER — Encounter: Payer: Self-pay | Admitting: Internal Medicine

## 2023-11-11 ENCOUNTER — Other Ambulatory Visit: Payer: Self-pay | Admitting: Internal Medicine

## 2023-11-11 DIAGNOSIS — I4819 Other persistent atrial fibrillation: Secondary | ICD-10-CM

## 2023-11-11 NOTE — Telephone Encounter (Signed)
Prescription refill request for Eliquis received. Indication:afib Last office visit:1/25 Scr:0.82  1/25 Age: 78 Weight:112.9  kg  Prescription refilled

## 2023-11-12 DIAGNOSIS — Z124 Encounter for screening for malignant neoplasm of cervix: Secondary | ICD-10-CM | POA: Diagnosis not present

## 2023-11-12 DIAGNOSIS — Z1151 Encounter for screening for human papillomavirus (HPV): Secondary | ICD-10-CM | POA: Diagnosis not present

## 2023-11-12 DIAGNOSIS — I10 Essential (primary) hypertension: Secondary | ICD-10-CM | POA: Diagnosis not present

## 2023-11-12 DIAGNOSIS — F418 Other specified anxiety disorders: Secondary | ICD-10-CM | POA: Insufficient documentation

## 2023-11-12 DIAGNOSIS — Z6841 Body Mass Index (BMI) 40.0 and over, adult: Secondary | ICD-10-CM | POA: Diagnosis not present

## 2023-11-13 ENCOUNTER — Telehealth: Payer: Self-pay

## 2023-11-13 NOTE — Telephone Encounter (Signed)
Pt had ED follow with Perlie Gold PA 11/01/23... she is wearing a ZIO currently and seeing Dr Tenny Craw back 01/14/24.   Will place her on my list for a sooner appt.

## 2023-11-15 DIAGNOSIS — R002 Palpitations: Secondary | ICD-10-CM | POA: Diagnosis not present

## 2023-11-18 ENCOUNTER — Ambulatory Visit (HOSPITAL_BASED_OUTPATIENT_CLINIC_OR_DEPARTMENT_OTHER): Payer: Medicare Other | Admitting: Physical Therapy

## 2023-11-19 ENCOUNTER — Ambulatory Visit (HOSPITAL_BASED_OUTPATIENT_CLINIC_OR_DEPARTMENT_OTHER): Payer: Medicare Other | Attending: Orthopaedic Surgery | Admitting: Physical Therapy

## 2023-11-19 DIAGNOSIS — M25511 Pain in right shoulder: Secondary | ICD-10-CM | POA: Diagnosis not present

## 2023-11-19 DIAGNOSIS — M6281 Muscle weakness (generalized): Secondary | ICD-10-CM | POA: Insufficient documentation

## 2023-11-19 DIAGNOSIS — M25512 Pain in left shoulder: Secondary | ICD-10-CM | POA: Diagnosis not present

## 2023-11-19 DIAGNOSIS — M25612 Stiffness of left shoulder, not elsewhere classified: Secondary | ICD-10-CM | POA: Insufficient documentation
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
## 2023-11-20 ENCOUNTER — Encounter (HOSPITAL_BASED_OUTPATIENT_CLINIC_OR_DEPARTMENT_OTHER): Payer: Self-pay | Admitting: Physical Therapy

## 2023-11-22 ENCOUNTER — Encounter: Payer: Self-pay | Admitting: Family Medicine

## 2023-11-22 ENCOUNTER — Ambulatory Visit (INDEPENDENT_AMBULATORY_CARE_PROVIDER_SITE_OTHER): Payer: Medicare Other | Admitting: Family Medicine

## 2023-11-22 VITALS — BP 104/76 | HR 62 | Temp 98.5°F | Ht 65.0 in | Wt 244.2 lb

## 2023-11-22 DIAGNOSIS — R051 Acute cough: Secondary | ICD-10-CM | POA: Diagnosis not present

## 2023-11-22 DIAGNOSIS — J208 Acute bronchitis due to other specified organisms: Secondary | ICD-10-CM | POA: Diagnosis not present

## 2023-11-22 DIAGNOSIS — G4733 Obstructive sleep apnea (adult) (pediatric): Secondary | ICD-10-CM | POA: Diagnosis not present

## 2023-11-22 DIAGNOSIS — B9689 Other specified bacterial agents as the cause of diseases classified elsewhere: Secondary | ICD-10-CM

## 2023-11-22 MED ORDER — PREDNISONE 10 MG (21) PO TBPK: ORAL_TABLET | Freq: Every day | ORAL | 0 refills | Status: AC

## 2023-11-22 NOTE — Progress Notes (Addendum)
 Acute Office Visit  Subjective:     Patient ID: Amanda Wilkins, female    DOB: Jul 05, 1946, 78 y.o.   MRN: [id number]  Chief Complaint  Patient presents with   Cough    Ongoing cough since the last Wednesday of December. Cough gets better than it gets worst      HPI Patient is in today for evaluation of cough for almost the last month.   Has been seen for this within November and was treated with a course of Bactrim .  States that she only took 3 doses of this medication.  Was given a 10-day prescription. States that she restarted this medication yesterday and has had 3 doses this week.   States that symptoms improved, so she stopped medication.   Reports that she sleeps with a CPAP, she is wondering if she is reinfecting herself or if this is causing her recurrent bronchitis. Reports mild shortness of breath and wheezing.  Denies any purulent or discolored sputum. Denies known sick contacts. Declines albuterol  inhaler today, states that it makes her antsy and jumpy. Denies abdominal pain, nausea, vomiting, diarrhea, rash, fever, chills, other symptoms.  Medical hx as outlined below.  ROS Per HPI      Objective:    BP 104/76 (BP Location: Left Arm, Patient Position: Sitting, Cuff Size: Large)   Pulse 62   Temp 98.5 F (36.9 C) (Oral)   Ht 5' 5 (1.651 m)   Wt 244 lb 3.2 oz (110.8 kg)   SpO2 97%   BMI 40.64 kg/m    Physical Exam Vitals and nursing note reviewed.  Constitutional:      General: She is not in acute distress.    Comments: Appears fatigued  HENT:     Head: Normocephalic and atraumatic.     Right Ear: Tympanic membrane and ear canal normal.     Left Ear: Tympanic membrane and ear canal normal.     Nose: No congestion.     Mouth/Throat:     Mouth: Mucous membranes are moist.     Pharynx: Oropharynx is clear. No oropharyngeal exudate or posterior oropharyngeal erythema.  Eyes:     Extraocular Movements: Extraocular movements intact.   Cardiovascular:     Rate and Rhythm: Normal rate and regular rhythm.     Pulses: Normal pulses.     Heart sounds: Normal heart sounds.  Pulmonary:     Effort: Pulmonary effort is normal. No respiratory distress.     Breath sounds: No stridor. Wheezing present. No rhonchi or rales.     Comments: Persistent dry cough Musculoskeletal:        General: Normal range of motion.     Cervical back: Normal range of motion and neck supple.  Lymphadenopathy:     Cervical: No cervical adenopathy.  Skin:    General: Skin is warm and dry.  Neurological:     General: No focal deficit present.     Mental Status: She is alert and oriented to person, place, and time.    No results found for any visits on 11/22/23.      Assessment & Plan:  1. Acute bronchitis due to other specified organisms (Primary)  - predniSONE  (STERAPRED UNI-PAK 21 TAB) 10 MG (21) TBPK tablet; Take by mouth daily for 6 days. Take 6 tablets on day 1, 5 tablets on day 2, 4 tablets on day 3, 3 tablets on day 4, 2 tablets on day 5, 1 tablet on day 6  Dispense: 21 tablet;  Refill: 0 - Continue Bactrim   2. Acute cough  Continue Hycodan cough syrup Push fluids  3. OSA on CPAP  Discussed how to clean nasal appliance and hose Follow-up as needed   Meds ordered this encounter  Medications   predniSONE  (STERAPRED UNI-PAK 21 TAB) 10 MG (21) TBPK tablet    Sig: Take by mouth daily for 6 days. Take 6 tablets on day 1, 5 tablets on day 2, 4 tablets on day 3, 3 tablets on day 4, 2 tablets on day 5, 1 tablet on day 6    Dispense:  21 tablet    Refill:  0    Return if symptoms worsen or fail to improve.  Corean Ku, FNP

## 2023-11-24 ENCOUNTER — Emergency Department (HOSPITAL_BASED_OUTPATIENT_CLINIC_OR_DEPARTMENT_OTHER)
Admission: EM | Admit: 2023-11-24 | Discharge: 2023-11-24 | Discharge status: Against Medical Advice | Payer: Medicare Other | Attending: Emergency Medicine | Admitting: Emergency Medicine

## 2023-11-24 ENCOUNTER — Encounter (HOSPITAL_BASED_OUTPATIENT_CLINIC_OR_DEPARTMENT_OTHER): Payer: Self-pay | Admitting: Emergency Medicine

## 2023-11-24 DIAGNOSIS — Z5321 Procedure and treatment not carried out due to patient leaving prior to being seen by health care provider: Secondary | ICD-10-CM | POA: Insufficient documentation

## 2023-11-24 DIAGNOSIS — R1030 Lower abdominal pain, unspecified: Secondary | ICD-10-CM | POA: Insufficient documentation

## 2023-11-24 DIAGNOSIS — R309 Painful micturition, unspecified: Secondary | ICD-10-CM | POA: Diagnosis not present

## 2023-11-24 LAB — URINALYSIS, ROUTINE W REFLEX MICROSCOPIC
Bilirubin Urine: NEGATIVE
Glucose, UA: NEGATIVE mg/dL
Hgb urine dipstick: NEGATIVE
Ketones, ur: NEGATIVE mg/dL
Leukocytes,Ua: NEGATIVE
Nitrite: NEGATIVE
Protein, ur: NEGATIVE mg/dL
Specific Gravity, Urine: 1.012 (ref 1.005–1.030)
pH: 6.5 (ref 5.0–8.0)

## 2023-11-24 NOTE — ED Triage Notes (Signed)
 C.o urinary burning and lower abd pain. States feels similar to previous UTI. Currently taking antibiotics and steroids for cough.

## 2023-11-24 NOTE — Patient Instructions (Signed)
 Take by mouth daily for 6 days. Take 6 tablets on day 1, 5 tablets on day 2, 4 tablets on day 3, 3 tablets on day 4, 2 tablets on day 5, 1 tablet on day 6   Continue Hycodan cough syrup.  Discussed cleaning CPAP hose and nasal appliance with diluted vinegar once a week to air dry, more vigorously than the soap and water .  If symptoms are not improving, if you develop a fever, if you begin to have purulent sputum, please follow-up with me and let me know.  Will consider antibiotic at that time if needed.

## 2023-11-25 ENCOUNTER — Telehealth: Payer: Self-pay

## 2023-11-25 ENCOUNTER — Encounter: Payer: Self-pay | Admitting: Family Medicine

## 2023-11-25 NOTE — Telephone Encounter (Signed)
 Patient is having dental implants which take about 4 months to heal.  Does she have to wait the full 4 months to have joint replacement surgery or can she have it during the healing time?

## 2023-11-26 ENCOUNTER — Ambulatory Visit: Payer: Self-pay | Admitting: Internal Medicine

## 2023-11-26 ENCOUNTER — Ambulatory Visit
Admission: RE | Admit: 2023-11-26 | Discharge: 2023-11-26 | Disposition: A | Discharge status: Home / Self Care | Payer: Medicare Other | Source: Ambulatory Visit | Attending: Obstetrics and Gynecology | Admitting: Obstetrics and Gynecology

## 2023-11-26 ENCOUNTER — Encounter: Payer: Self-pay | Admitting: Family Medicine

## 2023-11-26 ENCOUNTER — Ambulatory Visit (INDEPENDENT_AMBULATORY_CARE_PROVIDER_SITE_OTHER): Payer: Medicare Other | Admitting: Family Medicine

## 2023-11-26 ENCOUNTER — Ambulatory Visit (INDEPENDENT_AMBULATORY_CARE_PROVIDER_SITE_OTHER): Payer: Medicare Other

## 2023-11-26 VITALS — BP 102/70 | HR 57 | Temp 97.1°F | Ht 65.0 in | Wt 244.0 lb

## 2023-11-26 DIAGNOSIS — J069 Acute upper respiratory infection, unspecified: Secondary | ICD-10-CM

## 2023-11-26 DIAGNOSIS — Z1231 Encounter for screening mammogram for malignant neoplasm of breast: Secondary | ICD-10-CM | POA: Diagnosis not present

## 2023-11-26 DIAGNOSIS — R059 Cough, unspecified: Secondary | ICD-10-CM | POA: Diagnosis not present

## 2023-11-26 DIAGNOSIS — B9689 Other specified bacterial agents as the cause of diseases classified elsewhere: Secondary | ICD-10-CM | POA: Diagnosis not present

## 2023-11-26 DIAGNOSIS — R0602 Shortness of breath: Secondary | ICD-10-CM

## 2023-11-26 DIAGNOSIS — R053 Chronic cough: Secondary | ICD-10-CM | POA: Diagnosis not present

## 2023-11-26 DIAGNOSIS — R918 Other nonspecific abnormal finding of lung field: Secondary | ICD-10-CM | POA: Diagnosis not present

## 2023-11-26 LAB — COMPREHENSIVE METABOLIC PANEL
ALT: 25 U/L (ref 0–35)
AST: 26 U/L (ref 0–37)
Albumin: 4.7 g/dL (ref 3.5–5.2)
Alkaline Phosphatase: 65 U/L (ref 39–117)
BUN: 21 mg/dL (ref 6–23)
CO2: 25 meq/L (ref 19–32)
Calcium: 9.5 mg/dL (ref 8.4–10.5)
Chloride: 97 meq/L (ref 96–112)
Creatinine, Ser: 1.07 mg/dL (ref 0.40–1.20)
GFR: 50.29 mL/min — ABNORMAL LOW (ref >60.00)
Glucose, Bld: 96 mg/dL (ref 70–99)
Potassium: 4.7 meq/L (ref 3.5–5.1)
Sodium: 130 meq/L — ABNORMAL LOW (ref 135–145)
Total Bilirubin: 0.6 mg/dL (ref 0.2–1.2)
Total Protein: 7.7 g/dL (ref 6.0–8.3)

## 2023-11-26 LAB — CBC WITH DIFFERENTIAL/PLATELET
Basophils Absolute: 0 10*3/uL (ref 0.0–0.1)
Basophils Relative: 0.1 % (ref 0.0–3.0)
Eosinophils Absolute: 0 10*3/uL (ref 0.0–0.7)
Eosinophils Relative: 0 % (ref 0.0–5.0)
HCT: 40.3 % (ref 36.0–46.0)
Hemoglobin: 13.4 g/dL (ref 12.0–15.0)
Lymphocytes Relative: 21.1 % (ref 12.0–46.0)
Lymphs Abs: 1.5 10*3/uL (ref 0.7–4.0)
MCHC: 33.2 g/dL (ref 30.0–36.0)
MCV: 100.6 fL — ABNORMAL HIGH (ref 78.0–100.0)
Monocytes Absolute: 0.5 10*3/uL (ref 0.1–1.0)
Monocytes Relative: 6.8 % (ref 3.0–12.0)
Neutro Abs: 5.1 10*3/uL (ref 1.4–7.7)
Neutrophils Relative %: 72 % (ref 43.0–77.0)
Platelets: 186 10*3/uL (ref 150.0–400.0)
RBC: 4.01 Mil/uL (ref 3.87–5.11)
RDW: 14 % (ref 11.5–15.5)
WBC: 7.1 10*3/uL (ref 4.0–10.5)

## 2023-11-26 MED ORDER — LEVOFLOXACIN 500 MG PO TABS: 500.0000 mg | ORAL_TABLET | Freq: Every day | ORAL | 0 refills | Status: AC

## 2023-11-26 MED ORDER — BENZONATATE 100 MG PO CAPS: 200.0000 mg | ORAL_CAPSULE | Freq: Three times a day (TID) | ORAL | 0 refills | Status: DC | PRN

## 2023-11-26 NOTE — Telephone Encounter (Signed)
I called and discussed with patient.

## 2023-11-26 NOTE — Patient Instructions (Addendum)
I have sent in Levaquin for you to take once daily for 5 days.   Finish out steroid course.  We are getting an xray today. We will be in contact with any abnormal results that require further attention.  We are checking labs today, will be in contact with any results that require further attention  I have also placed a referral to pulmonology for you today. Someone will be reaching out to get you scheduled.  Follow-up with me for new or worsening symptoms.

## 2023-11-26 NOTE — Telephone Encounter (Signed)
  Chief Complaint: constant cough , SOB after coughing Symptoms: non productive cough, SOB after coughing spells. "Feels like something stuck in left lung" runny nose "Dripping". Taking prednisone - 2 days left taking antibiotics-1 day left. Frequency: since end of December Pertinent Negatives: Patient denies fever, no difficulty breathing  Disposition: [] ED /[] Urgent Care (no appt availability in office) / [x] Appointment(In office/virtual)/ []  [place] [facility]/ [] Home Care/ [] Refused Recommended Disposition /[] [facility]/ []  Follow-up with PCP Additional Notes:   Appt scheduled today due to SOB after coughing.       Copied from CRM [id number]. Topic: Clinical - Red Word Triage >> Nov 26, 2023  9:22 AM Gurney Maxin H wrote: [facility] that prompted transfer to Nurse Triage: Patient not getting better, coughing, gurgling in chest on left side, hard to catch breath after coughing. Reason for Disposition  [1] MILD difficulty breathing (e.g., minimal/no SOB at rest, SOB with walking, pulse <100) AND [2] still present when not coughing  Answer Assessment - Initial Assessment Questions 1. ONSET: "When did the cough begin?"      End of Dec. 2. SEVERITY: "How bad is the cough today?"      Coughing spells causing SOB 3. SPUTUM: "Describe the color of your sputum" (none, dry cough; clear, white, yellow, green)     None  4. HEMOPTYSIS: "Are you coughing up any blood?" If so ask: "How much?" (flecks, streaks, tablespoons, etc.)     na 5. DIFFICULTY BREATHING: "Are you having difficulty breathing?" If Yes, ask: "How bad is it?" (e.g., mild, moderate, severe)    - MILD: No SOB at rest, mild SOB with walking, speaks normally in sentences, can lie down, no retractions, pulse < 100.    - MODERATE: SOB at rest, SOB with minimal exertion and prefers to sit, cannot lie down flat, speaks in phrases, mild retractions, audible wheezing, pulse 100-120.    - SEVERE: Very SOB at rest, speaks in single  words, struggling to breathe, sitting hunched forward, retractions, pulse > 120      Can sleep uses CPAP. Coughing causes SOB.  6. FEVER: "Do you have a fever?" If Yes, ask: "What is your temperature, how was it measured, and when did it start?"     na 7. CARDIAC HISTORY: "Do you have any history of heart disease?" (e.g., heart attack, congestive heart failure)      See hx  8. LUNG HISTORY: "Do you have any history of lung disease?"  (e.g., pulmonary embolus, asthma, emphysema)     See hx  9. PE RISK FACTORS: "Do you have a history of blood clots?" (or: recent major surgery, recent prolonged travel, bedridden)     Na  10. OTHER SYMPTOMS: "Do you have any other symptoms?" (e.g., runny nose, wheezing, chest pain)       Runny nose "drips". Cough non productive SOB after coughing. "Feels like something stuck" in left lung 11. PREGNANCY: "Is there any chance you are pregnant?" "When was your last menstrual period?"       na 12. TRAVEL: "Have you traveled out of the country in the last month?" (e.g., travel history, exposures)       na  Protocols used: Cough - Acute Non-Productive-A-AH

## 2023-11-26 NOTE — Progress Notes (Signed)
Acute Office Visit  Subjective:     Patient ID: Amanda Wilkins, female    DOB: 1946-03-05, 78 y.o.   MRN: [id number]  Chief Complaint  Patient presents with   Cough    Persistent cough since December (patient notes that it feels as though the phlegm is there but not able to produce phlegm). Also experiencing fatigue and shortness of breath. Patient noted after taking prednisone script things had gotten better for that day (Saturday) but by the next morning symptoms resumed.    HPI Patient is in today for evaluation of persistent cough, chest wall pain, SOB, for the last month. Was seen 4 days ago by me and treated with steroids, Bactrim. Mild improvement the next day, then symptoms worsened again. No inhalers, states that she cannot tolerate them Denies abdominal pain, nausea, vomiting, diarrhea, rash, fever, chills, other symptoms.  Medical hx as outlined below.  ROS Per HPI      Objective:    BP 102/70   Pulse (!) 57   Temp (!) 97.1 F (36.2 C)   Ht 5\' 5"  (1.651 m)   Wt 244 lb (110.7 kg)   SpO2 97%   BMI 40.60 kg/m    Physical Exam Vitals and nursing note reviewed.  Constitutional:      General: She is not in acute distress.    Comments: Appears fatigued  HENT:     Head: Normocephalic and atraumatic.     Right Ear: Tympanic membrane and ear canal normal.     Left Ear: Tympanic membrane and ear canal normal.     Nose: No congestion.     Mouth/Throat:     Mouth: Mucous membranes are moist.     Pharynx: Oropharynx is clear. No oropharyngeal exudate or posterior oropharyngeal erythema.  Eyes:     Extraocular Movements: Extraocular movements intact.  Cardiovascular:     Rate and Rhythm: Normal rate and regular rhythm.     Pulses: Normal pulses.     Heart sounds: Normal heart sounds.  Pulmonary:     Effort: Pulmonary effort is normal. No respiratory distress.     Breath sounds: No stridor. Wheezing present. No rhonchi or rales.     Comments: Persistent  dry cough Musculoskeletal:        General: Normal range of motion.     Cervical back: Normal range of motion and neck supple.  Lymphadenopathy:     Cervical: No cervical adenopathy.  Skin:    General: Skin is warm and dry.  Neurological:     General: No focal deficit present.     Mental Status: She is alert and oriented to person, place, and time.    No results found for any visits on 11/26/23.      Assessment & Plan:  1. Bacterial URI (Primary)  - levofloxacin (LEVAQUIN) 500 MG tablet; Take 1 tablet (500 mg total) by mouth daily for 5 days.  Dispense: 5 tablet; Refill: 0 - Will cover for PNA given course of symptoms  2. Persistent cough  - DG Chest 2 View; Future - Comprehensive metabolic panel - CBC with Differential/Platelet - benzonatate (TESSALON PERLES) 100 MG capsule; Take 2 capsules (200 mg total) by mouth 3 (three) times daily as needed for cough.  Dispense: 30 capsule; Refill: 0  3. SOB (shortness of breath)  - DG Chest 2 View; Future - Comprehensive metabolic panel - CBC with Differential/Platelet  - continue course of prednisone - Referral to pulmonology today   Meds ordered  this encounter  Medications   benzonatate (TESSALON PERLES) 100 MG capsule    Sig: Take 2 capsules (200 mg total) by mouth 3 (three) times daily as needed for cough.    Dispense:  30 capsule    Refill:  0   levofloxacin (LEVAQUIN) 500 MG tablet    Sig: Take 1 tablet (500 mg total) by mouth daily for 5 days.    Dispense:  5 tablet    Refill:  0    Return if symptoms worsen or fail to improve.  Moshe Cipro, FNP

## 2023-11-27 ENCOUNTER — Encounter: Payer: Self-pay | Admitting: Family Medicine

## 2023-12-16 ENCOUNTER — Ambulatory Visit: Payer: Medicare Other | Admitting: Physician Assistant

## 2023-12-19 ENCOUNTER — Telehealth: Payer: Self-pay | Admitting: Internal Medicine

## 2023-12-19 NOTE — Telephone Encounter (Signed)
   Name: Elda Dunkerson  DOB: 07/22/1946  MRN: [id number]  Primary Cardiologist: Dietrich Pates, MD  Chart reviewed as part of pre-operative protocol coverage. The patient has an upcoming visit scheduled with Dr. Tenny Craw on 01/14/2024 at which time clearance can be addressed in case there are any issues that would impact surgical recommendations.  I added preop FYI to appointment note so that provider is aware to address at time of outpatient visit.  Per office protocol the cardiology provider should forward their finalized clearance decision and recommendations regarding antiplatelet therapy to the requesting party below.    This message will also be routed to pharmacy pool  for input on holding Eliquis as requested below so that this information is available to the clearing provider at time of patient's appointment.   I will route this message as FYI to requesting party and remove this message from the preop box as separate preop APP input not needed at this time.   Please call with any questions.  Napoleon Form, Leodis Rains, NP  12/19/2023, 11:32 AM

## 2023-12-19 NOTE — Telephone Encounter (Signed)
   Pre-operative Risk Assessment    Patient Name: Amanda Wilkins  DOB: 1946/02/14 MRN: [id number]   Date of last office visit: 11/01/2023 Date of next office visit: 01/14/2024   Request for Surgical Clearance    Procedure:   Left total knee arthroplasty  Date of Surgery:  Clearance TBD                                Surgeon:  Dr. Doneen Poisson  Surgeon's Group or Practice Name:  [facility] Phone number:  [phone] Fax number:  [phone]   Type of Clearance Requested:   - Medical  - Pharmacy:  Hold Apixaban (Eliquis) need instructions   Type of Anesthesia:  Spinal and block   Additional requests/questions:    SignedRoyann Shivers   12/19/2023, 11:26 AM

## 2023-12-19 NOTE — Telephone Encounter (Signed)
 Patient with diagnosis of atrial fibrillation on Eliquis for anticoagulation.    Procedure:   Left total knee arthroplasty   Date of Surgery:  Clearance TBD     CHA2DS2-VASc Score = 5   This indicates a 7.2% annual risk of stroke. The patient's score is based upon: CHF History: 0 HTN History: 1 Diabetes History: 0 Stroke History: 0 Vascular Disease History: 1 Age Score: 2 Gender Score: 1    CrCl 77 Platelet count 186  Per office protocol, patient can hold Eliquis for 3 days prior to procedure.   Patient will not need bridging with Lovenox (enoxaparin) around procedure.  **This guidance is not considered finalized until pre-operative APP has relayed final recommendations.**

## 2023-12-24 ENCOUNTER — Ambulatory Visit (INDEPENDENT_AMBULATORY_CARE_PROVIDER_SITE_OTHER): Admitting: Physician Assistant

## 2023-12-24 ENCOUNTER — Encounter: Payer: Self-pay | Admitting: Physician Assistant

## 2023-12-24 ENCOUNTER — Other Ambulatory Visit (INDEPENDENT_AMBULATORY_CARE_PROVIDER_SITE_OTHER): Payer: Self-pay

## 2023-12-24 DIAGNOSIS — M25551 Pain in right hip: Secondary | ICD-10-CM | POA: Diagnosis not present
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
## 2023-12-25 ENCOUNTER — Other Ambulatory Visit: Payer: Self-pay

## 2023-12-25 DIAGNOSIS — M25551 Pain in right hip: Secondary | ICD-10-CM

## 2023-12-26 ENCOUNTER — Telehealth: Payer: Self-pay | Admitting: Internal Medicine

## 2023-12-26 ENCOUNTER — Ambulatory Visit: Admitting: Internal Medicine

## 2023-12-26 NOTE — Telephone Encounter (Addendum)
 Fax received from Dr. Deboraha Sprang with [facility] [place] to perform a total knee arthroplasty on patient.  Patient needs surgery clearance. Surgery is planned for 01/21/24. Patient has not been seen here yet . Has appt with MR for 11:30 today. Will hold in clearance pool.
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
## 2023-12-27 ENCOUNTER — Encounter: Payer: Self-pay | Admitting: Radiology

## 2023-12-27 ENCOUNTER — Telehealth: Payer: Self-pay

## 2023-12-27 NOTE — Telephone Encounter (Signed)
 Pt is scheduled with Beth for 4/3

## 2023-12-27 NOTE — Telephone Encounter (Signed)
 Needs a letter to suspend membership at [facility] while she recovers from TKA planned for 01/21/24.

## 2024-01-02 ENCOUNTER — Ambulatory Visit: Admitting: Rehabilitative and Restorative Service Providers"

## 2024-01-02 ENCOUNTER — Encounter: Payer: Self-pay | Admitting: Rehabilitative and Restorative Service Providers"

## 2024-01-02 DIAGNOSIS — G8929 Other chronic pain: Secondary | ICD-10-CM | POA: Diagnosis not present

## 2024-01-02 DIAGNOSIS — R262 Difficulty in walking, not elsewhere classified: Secondary | ICD-10-CM

## 2024-01-02 DIAGNOSIS — M6281 Muscle weakness (generalized): Secondary | ICD-10-CM

## 2024-01-02 DIAGNOSIS — M25562 Pain in left knee: Secondary | ICD-10-CM

## 2024-01-02 DIAGNOSIS — M25551 Pain in right hip: Secondary | ICD-10-CM | POA: Diagnosis not present

## 2024-01-02 DIAGNOSIS — M79604 Pain in right leg: Secondary | ICD-10-CM | POA: Diagnosis not present

## 2024-01-08 ENCOUNTER — Encounter: Payer: Self-pay | Admitting: Rehabilitative and Restorative Service Providers"

## 2024-01-08 ENCOUNTER — Ambulatory Visit (INDEPENDENT_AMBULATORY_CARE_PROVIDER_SITE_OTHER): Admitting: Rehabilitative and Restorative Service Providers"

## 2024-01-08 ENCOUNTER — Encounter (HOSPITAL_COMMUNITY): Payer: Self-pay

## 2024-01-08 DIAGNOSIS — M79604 Pain in right leg: Secondary | ICD-10-CM | POA: Diagnosis not present

## 2024-01-08 DIAGNOSIS — M25551 Pain in right hip: Secondary | ICD-10-CM | POA: Diagnosis not present

## 2024-01-08 DIAGNOSIS — R262 Difficulty in walking, not elsewhere classified: Secondary | ICD-10-CM

## 2024-01-08 DIAGNOSIS — M6281 Muscle weakness (generalized): Secondary | ICD-10-CM | POA: Diagnosis not present

## 2024-01-08 DIAGNOSIS — M25562 Pain in left knee: Secondary | ICD-10-CM | POA: Diagnosis not present

## 2024-01-08 DIAGNOSIS — G8929 Other chronic pain: Secondary | ICD-10-CM
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
## 2024-01-08 NOTE — Pre-Procedure Instructions (Signed)
 Surgical Instructions   Your procedure is scheduled on January 21, 2024. Report to [facility] Main Entrance "A" at 7:55 A.M., then check in with the Admitting office. Any questions or running late day of surgery: call [phone]  Questions prior to your surgery date: call [phone], Monday-Friday, 8am-4pm. If you experience any cold or flu symptoms such as cough, fever, chills, shortness of breath, etc. between now and your scheduled surgery, please notify us at the above number.     Remember:  Do not eat after midnight the night before your surgery  You may drink clear liquids until 6:55 AM the morning of your surgery.   Clear liquids allowed are: Water, Non-Citrus Juices (without pulp), Carbonated Beverages, Clear Tea (no milk, honey, etc.), Black Coffee Only (NO MILK, CREAM OR POWDERED CREAMER of any kind), and Gatorade.  Patient Instructions  The night before surgery:  No food after midnight. ONLY clear liquids after midnight  The day of surgery (if you do NOT have diabetes):  Drink ONE (1) Pre-Surgery Clear Ensure by 6:55 AM the morning of surgery. Drink in one sitting. Do not sip.  This drink was given to you during your hospital  pre-op appointment visit.  Nothing else to drink after completing the  Pre-Surgery Clear Ensure.         If you have questions, please contact your surgeon's office.    Take these medicines the morning of surgery with A SIP OF WATER: flecainide (TAMBOCOR)  hydroxychloroquine (PLAQUENIL)  metoprolol tartrate (LOPRESSOR)  rosuvastatin (CRESTOR)  sertraline (ZOLOFT)    May take these medicines IF NEEDED: acetaminophen (TYLENOL)  ALPRAZolam (XANAX)  amLODipine (NORVASC)  HYDROcodone-acetaminophen (NORCO/VICODIN)    Follow your surgeon's instructions on when to stop ELIQUIS.  If no instructions were given by your surgeon then you will need to call the office to get those instructions.     One week prior to surgery, STOP taking any  Aspirin (unless otherwise instructed by your surgeon) Aleve, Naproxen, Ibuprofen, Motrin, Advil, Goody's, BC's, all herbal medications, fish oil, and non-prescription vitamins.                     Do NOT Smoke (Tobacco/Vaping) for 24 hours prior to your procedure.  If you use a CPAP at night, you may bring your mask/headgear for your overnight stay.   You will be asked to remove any contacts, glasses, piercing's, hearing aid's, dentures/partials prior to surgery. Please bring cases for these items if needed.    Patients discharged the day of surgery will not be allowed to drive home, and someone needs to stay with them for 24 hours.  SURGICAL WAITING ROOM VISITATION Patients may have no more than 2 support people in the waiting area - these visitors may rotate.   Pre-op nurse will coordinate an appropriate time for 1 ADULT support person, who may not rotate, to accompany patient in pre-op.  Children under the age of 62 must have an adult with them who is not the patient and must remain in the main waiting area with an adult.  If the patient needs to stay at the hospital during part of their recovery, the visitor guidelines for inpatient rooms apply.  Please refer to the [facility] website for the visitor guidelines for any additional information.   If you received a COVID test during your pre-op visit  it is requested that you wear a mask when out in public, stay away from anyone that may not be  feeling well and notify your surgeon if you develop symptoms. If you have been in contact with anyone that has tested positive in the last 10 days please notify you surgeon.      Pre-operative 5 CHG Bathing Instructions   You can play a key role in reducing the risk of infection after surgery. Your skin needs to be as free of germs as possible. You can reduce the number of germs on your skin by washing with CHG (chlorhexidine gluconate) soap before surgery. CHG is an antiseptic soap that kills  germs and continues to kill germs even after washing.   DO NOT use if you have an allergy to chlorhexidine/CHG or antibacterial soaps. If your skin becomes reddened or irritated, stop using the CHG and notify one of our RNs at [phone].   Please shower with the CHG soap starting 4 days before surgery using the following schedule:     Please keep in mind the following:  DO NOT shave, including legs and underarms, starting the day of your first shower.   You may shave your face at any point before/day of surgery.  Place clean sheets on your bed the day you start using CHG soap. Use a clean washcloth (not used since being washed) for each shower. DO NOT sleep with pets once you start using the CHG.   CHG Shower Instructions:  Wash your face and private area with normal soap. If you choose to wash your hair, wash first with your normal shampoo.  After you use shampoo/soap, rinse your hair and body thoroughly to remove shampoo/soap residue.  Turn the water OFF and apply about 3 tablespoons (45 ml) of CHG soap to a CLEAN washcloth.  Apply CHG soap ONLY FROM YOUR NECK DOWN TO YOUR TOES (washing for 3-5 minutes)  DO NOT use CHG soap on face, private areas, open wounds, or sores.  Pay special attention to the area where your surgery is being performed.  If you are having back surgery, having someone wash your back for you may be helpful. Wait 2 minutes after CHG soap is applied, then you may rinse off the CHG soap.  Pat dry with a clean towel  Put on clean clothes/pajamas   If you choose to wear lotion, please use ONLY the CHG-compatible lotions that are listed below.  Additional instructions for the day of surgery: DO NOT APPLY any lotions, deodorants, cologne, or perfumes.   Do not bring valuables to the hospital. [facility] is not responsible for any belongings/valuables. Do not wear nail polish, gel polish, artificial nails, or any other type of covering on natural nails (fingers and  toes) Do not wear jewelry or makeup Put on clean/comfortable clothes.  Please brush your teeth.  Ask your nurse before applying any prescription medications to the skin.     CHG Compatible Lotions   Aveeno Moisturizing lotion  Cetaphil Moisturizing Cream  Cetaphil Moisturizing Lotion  Clairol Herbal Essence Moisturizing Lotion, Dry Skin  Clairol Herbal Essence Moisturizing Lotion, Extra Dry Skin  Clairol Herbal Essence Moisturizing Lotion, Normal Skin  Curel Age Defying Therapeutic Moisturizing Lotion with Alpha Hydroxy  Curel Extreme Care Body Lotion  Curel Soothing Hands Moisturizing Hand Lotion  Curel Therapeutic Moisturizing Cream, Fragrance-Free  Curel Therapeutic Moisturizing Lotion, Fragrance-Free  Curel Therapeutic Moisturizing Lotion, Original Formula  Eucerin Daily Replenishing Lotion  Eucerin Dry Skin Therapy Plus Alpha Hydroxy Crme  Eucerin Dry Skin Therapy Plus Alpha Hydroxy Lotion  Eucerin Original Crme  Eucerin Original Lotion  Eucerin Plus Crme Eucerin Plus Lotion  Eucerin TriLipid Replenishing Lotion  Keri Anti-Bacterial Hand Lotion  Keri Deep Conditioning Original Lotion Dry Skin Formula Softly Scented  Keri Deep Conditioning Original Lotion, Fragrance Free Sensitive Skin Formula  Keri Lotion Fast Absorbing Fragrance Free Sensitive Skin Formula  Keri Lotion Fast Absorbing Softly Scented Dry Skin Formula  Keri Original Lotion  Keri Skin Renewal Lotion Keri Silky Smooth Lotion  Keri Silky Smooth Sensitive Skin Lotion  Nivea Body Creamy Conditioning Oil  Nivea Body Extra Enriched Lotion  Nivea Body Original Lotion  Nivea Body Sheer Moisturizing Lotion Nivea Crme  Nivea Skin Firming Lotion  NutraDerm 30 Skin Lotion  NutraDerm Skin Lotion  NutraDerm Therapeutic Skin Cream  NutraDerm Therapeutic Skin Lotion  ProShield Protective Hand Cream  Provon moisturizing lotion  Please read over the following fact sheets that you were given.

## 2024-01-09 ENCOUNTER — Other Ambulatory Visit: Payer: Self-pay

## 2024-01-09 ENCOUNTER — Encounter (HOSPITAL_COMMUNITY)
Admission: RE | Admit: 2024-01-09 | Discharge: 2024-01-09 | Disposition: A | Discharge status: Home / Self Care | Source: Ambulatory Visit | Attending: Orthopaedic Surgery | Admitting: Orthopaedic Surgery

## 2024-01-09 ENCOUNTER — Encounter (HOSPITAL_COMMUNITY): Payer: Self-pay

## 2024-01-09 VITALS — BP 133/67 | HR 75 | Temp 97.8°F | Resp 18 | Ht 65.0 in | Wt 248.8 lb

## 2024-01-09 DIAGNOSIS — Z01818 Encounter for other preprocedural examination: Secondary | ICD-10-CM

## 2024-01-09 DIAGNOSIS — Z7901 Long term (current) use of anticoagulants: Secondary | ICD-10-CM | POA: Insufficient documentation

## 2024-01-09 DIAGNOSIS — I2510 Atherosclerotic heart disease of native coronary artery without angina pectoris: Secondary | ICD-10-CM | POA: Diagnosis not present

## 2024-01-09 DIAGNOSIS — M1712 Unilateral primary osteoarthritis, left knee: Secondary | ICD-10-CM | POA: Insufficient documentation

## 2024-01-09 DIAGNOSIS — G4733 Obstructive sleep apnea (adult) (pediatric): Secondary | ICD-10-CM | POA: Diagnosis not present

## 2024-01-09 DIAGNOSIS — I480 Paroxysmal atrial fibrillation: Secondary | ICD-10-CM | POA: Insufficient documentation

## 2024-01-09 DIAGNOSIS — I4892 Unspecified atrial flutter: Secondary | ICD-10-CM | POA: Diagnosis not present

## 2024-01-09 DIAGNOSIS — E669 Obesity, unspecified: Secondary | ICD-10-CM | POA: Insufficient documentation

## 2024-01-09 DIAGNOSIS — I471 Supraventricular tachycardia, unspecified: Secondary | ICD-10-CM | POA: Insufficient documentation

## 2024-01-09 DIAGNOSIS — I10 Essential (primary) hypertension: Secondary | ICD-10-CM | POA: Insufficient documentation

## 2024-01-09 DIAGNOSIS — K219 Gastro-esophageal reflux disease without esophagitis: Secondary | ICD-10-CM | POA: Insufficient documentation

## 2024-01-09 DIAGNOSIS — Z96651 Presence of right artificial knee joint: Secondary | ICD-10-CM | POA: Insufficient documentation

## 2024-01-09 DIAGNOSIS — Z01812 Encounter for preprocedural laboratory examination: Secondary | ICD-10-CM | POA: Diagnosis not present

## 2024-01-09 DIAGNOSIS — M069 Rheumatoid arthritis, unspecified: Secondary | ICD-10-CM | POA: Insufficient documentation

## 2024-01-09 DIAGNOSIS — J40 Bronchitis, not specified as acute or chronic: Secondary | ICD-10-CM | POA: Diagnosis not present

## 2024-01-09 DIAGNOSIS — Z79899 Other long term (current) drug therapy: Secondary | ICD-10-CM | POA: Insufficient documentation

## 2024-01-09 HISTORY — DX: Cardiac arrhythmia, unspecified: I49.9

## 2024-01-09 HISTORY — DX: Atherosclerotic heart disease of native coronary artery without angina pectoris: I25.10

## 2024-01-09 LAB — CBC
HCT: 35.3 % — ABNORMAL LOW (ref 36.0–46.0)
Hemoglobin: 11.8 g/dL — ABNORMAL LOW (ref 12.0–15.0)
MCH: 32.7 pg (ref 26.0–34.0)
MCHC: 33.4 g/dL (ref 30.0–36.0)
MCV: 97.8 fL (ref 80.0–100.0)
Platelets: 170 10*3/uL (ref 150–400)
RBC: 3.61 MIL/uL — ABNORMAL LOW (ref 3.87–5.11)
RDW: 12.8 % (ref 11.5–15.5)
WBC: 5.5 10*3/uL (ref 4.0–10.5)
nRBC: 0 % (ref 0.0–0.2)

## 2024-01-09 LAB — BASIC METABOLIC PANEL WITH GFR
Anion gap: 11 (ref 5–15)
BUN: 12 mg/dL (ref 8–23)
CO2: 23 mmol/L (ref 22–32)
Calcium: 9.4 mg/dL (ref 8.9–10.3)
Chloride: 101 mmol/L (ref 98–111)
Creatinine, Ser: 0.83 mg/dL (ref 0.44–1.00)
GFR, Estimated: >60 mL/min (ref >60)
Glucose, Bld: 90 mg/dL (ref 70–99)
Potassium: 5 mmol/L (ref 3.5–5.1)
Sodium: 135 mmol/L (ref 135–145)

## 2024-01-09 LAB — SURGICAL PCR SCREEN
MRSA, PCR: NEGATIVE
Staphylococcus aureus: NEGATIVE

## 2024-01-09 NOTE — Progress Notes (Signed)
 PCP - Dr. Jacinta Shoe Cardiologist - Dr. Dietrich Pates - Last office visit 11/01/2023  PPM/ICD - Denies Device Orders - n/a Rep Notified - n/a  Chest x-ray - n/a EKG - 10/28/2023 Stress Test - 05/31/2020 ECHO - 09/25/2023 Cardiac Cath - Denies  Sleep Study - +OSA. Pt wears CPAP nightly. Pressure setting is 12  No DM  Last dose of GLP1 agonist- n/a  GLP1 instructions: n/a  Blood Thinner Instructions: Per pt, she was instructed to take last dose of Eliquis on March 5th. Aspirin Instructions: n/a  ERAS Protcol - Clear liquids until 0655 morning of surgery PRE-SURGERY Ensure or G2- Pt unable to drink clear ensure due to the strawberry flavoring. Pt instructed to drink regular Gatorade instead  COVID TEST- n/a   Anesthesia review: Yes. Cardiac Clearance  Patient denies shortness of breath, fever, cough and chest pain at PAT appointment. Pt denies any respiratory illness/infection in the last two months.    All instructions explained to the patient, with a verbal understanding of the material. Patient agrees to go over the instructions while at home for a better understanding. Patient also instructed to self quarantine after being tested for COVID-19. The opportunity to ask questions was provided.

## 2024-01-10 ENCOUNTER — Encounter: Payer: Self-pay | Admitting: Internal Medicine

## 2024-01-10 ENCOUNTER — Other Ambulatory Visit: Payer: Self-pay | Admitting: Internal Medicine

## 2024-01-10 NOTE — Progress Notes (Addendum)
 Anesthesia Chart Review:  Case: [id number] Date/Time: 01/21/24 0940   Procedure: ARTHROPLASTY, KNEE, TOTAL (Left: Knee)   Anesthesia type: Spinal   Diagnosis: Primary osteoarthritis of left knee [M17.12]   Pre-op diagnosis: osteoarthritis left knee   [facility] RO[facility] / [facility]   Surgeons: Kathryne Hitch, MD       DISCUSSION: Patient is a 78 year old female scheduled for the above procedure.   History includes never smoker, HTN, paroxysmal afib/flutter/PSVT (s/p DCCV 08/28/21; on flecainide, metoprolol & Eliquis), CAD ("mild", CAC 0 07/02/19), OSA (uses CPAP), hyponatremia, sepsis with interstitial nephritis (2016), carotid artery disease (1-39% BICA 2018), GERD, RA, osteoarthritis (right TKA 09/02/18), cholecystectomy (11/16/21), obesity.   She has cardiology follow-up scheduled with Dr. Dietrich Pates on 01/14/24. She has pulmonology evaluation with Dr. Kara Mead on 01/16/24 (referred by primary care for dyspnea, persistent cough since 09/2023 following multiple rounds of antibiotics for bronchitis).   She  is on Eliquis 5 mg BID. She will need to hold Eliquis at least 72 hours to be considered for spinal anesthesia. No Lovenox bridge recommended per their office protocol. If acceptable risk by cardiology then will plan to follow-up with patient to clarify her last Eliquis dose would need to be on 01/17/24 PM for an 01/21/24 AM surgery.   She previously reported being a difficult IV stick and has required ultrasound guidance.   Chart will be left for follow-up.  ADDENDUM 01/16/24 4:42 PM:  I spoke with patient. She clarified that her last Eliquis is actually planned for 01/17/24 PM, so she will be off anticoagulation for a full 72 hours before surgery. Her visit with Dr. Tenny Craw was rescheduled due to physician illness. They rescheduled her to see Tereso Newcomer, PA-C on 01/20/24. She says she feels well from a cardiac standpoint. Palpitations have been controlled.   She had pulmonology visit for  chronic cough and preoperative evaluation with Durel Salts, MD today. Note reviewed. Chronic cough thought likely related to postnasal drainage, although possible asthma component. CXR clear in February. Xopenix prescribed as a trail for asthma. She also recommended azelastine and fluticasone nasal spray for postnasal drainage. Follow-up after surgery planned. In regards to preoperative assessment: "Preoperative Assessment for Knee Surgery No acute respiratory issues contraindicating surgery. Low risk based on ariscat index for postoperative pulmonary complications.  - Encourage aggressive incentive spirometry hourly both peri-operatively and post-operatively as tolerated  - Early ambulation and physical therapy as tolerated post-operatively - Adequate pain control especially in the setting of abdominal and thoracic surgery - Bronchodilators as needed for wheezing or shortness of breath - Send assessment to Dr. Magnus Ivan at [facility] for surgical planning. ARISCAT: Mazo et al. Anesthesiology [phone]".    VS: BP 133/67   Pulse 75   Temp 36.6 C (Oral)   Resp 18   Ht 5\' 5"  (1.651 m)   Wt 112.9 kg   SpO2 98%   BMI 41.40 kg/m    PROVIDERS: Plotnikov, Georgina Quint, MD is PCP  Dietrich Pates, MD is cardiologist Zenovia Jordan, MD is rheumatologist   LABS: Labs reviewed: Acceptable for surgery.  LFTS normal 11/26/23. A1c 5.7% on 10/30/23.  (all labs ordered are listed, but only abnormal results are displayed)  Labs Reviewed  CBC - Abnormal; Notable for the following components:      Result Value   RBC 3.61 (*)    Hemoglobin 11.8 (*)    HCT 35.3 (*)    All other components within  normal limits  SURGICAL PCR SCREEN  BASIC METABOLIC PANEL WITH GFR    PFTs 07/31/19: FVC 2.92 (93%), FEV1 2.28 (96%). DLCO unc 20.93 (102%)     IMAGES: CXR 11/26/23: FINDINGS: Central airways thickening. No consolidation or effusion. Normal cardiac size with aortic atherosclerosis. No  pneumothorax IMPRESSION: Central airways thickening suggesting bronchitis or reactive airways. No acute airspace disease       EKG: 10/27/23: Sinus rhythm with 1st degree A-V block Low voltage QRS Borderline ECG When compared with ECG of 27-Oct-2023 00:51, Premature atrial complexes are no longer present Confirmed by Dione Booze ([id number]) on 10/27/2023 1:43:44 AM     CV: Long term monitor 11/01/23 - 11/12/23: Impression:  Sinus rytm  Rate 46 to 102 bpm    Average HR 61 bpm   Rare PAC, rare PVC. Bursts of SVT, longest and fastest was 12 min 21 sec at 138 bpm maximal rate (average 97 bpm)   - Triggered events correlated with sinus rhythm and sinus rhythm with PACs plus  SVT.   Echo 09/25/23: IMPRESSIONS   1. Left ventricular ejection fraction, by estimation, is 60 to 65%. The  left ventricle has normal function. The left ventricle has no regional  wall motion abnormalities. Diastolic dysfunction is not assessed due  suboptimal images - no tissue doppler.   2. Right ventricular systolic function grossly normal. The right  ventricular size is grossly normal. There is normal pulmonary artery  systolic pressure.   3. The mitral valve was not well visualized. Trivial mitral valve  regurgitation. No evidence of mitral stenosis.   4. The aortic valve was not well visualized.   5. The inferior vena cava is normal in size with greater than 50%  respiratory variability, suggesting right atrial pressure of 3 mmHg.  - Comparison(s): A prior study was performed on 10/30/2022. Reported LVEF  55-60%, grade 2 diastolic dysfunction, RVSP , moderate LAE and RAE,  mild mR, mild to moderate TR, estimated RAP 3 mmHg; (12/09/19 EF 55-60%, estimated RVSP 46.7 mmHg; 01/25/15 EF 55-60%. PA pressure )     CPX 05/31/20: Conclusion: Exercise testing with gas exchange demonstrates normal functional capacity when compared to matched sedentary norms. There is no clear indication for cardiopulmonary  abnormality. Patient's body habitus appears  Primary role in her exercise limitation. VE/VCO2 slope is elevated and could be indicative of increased pulmonary pressures, however appears more likely hyperventilation with low PETCO2. Patient was advised, if cardiology cleared, to begin 3x 10 min bouts of cardio exercise throughout her day to help improve dyspnea with activity.  - Preliminary impression by: Lesia Hausen, MS, ACSM-RCEP  - Final impression by: Arvilla Meres, MD  Agree with above. Overall normal peak VO2 for matched subjects. Exercise limitation seems to be primarily due to her body habitus but there is a hypertensive response to exercise and the VE/VCO2 slope is significantly elevated suggesting elevated pulmonary pressures during exercise. This can be related to diastolic dysfunction or baseline OHS. Agree with plan for structured exercise program/weight loss and then retesting as needed with or without invasive monitoring.       CT Cardiac Scoring 07/02/19: IMPRESSION: Coronary calcium score of 0. This was 0 percentile for age and sex matched control.     US Carotid 08/30/17: Final Interpretation:  - Right Carotid: There is evidence in the right ICA of a 1-39% stenosis. The RICA velocities remain within normal range and stable compared to the prior exam.  - Left Carotid: There is evidence in the  left ICA of a 1-39% stenosis. The LICA velocities remain within normal range and stable compared to the prior exam.  - Vertebrals:  Both vertebral arteries were patent with antegrade flow.  - Subclavians: Normal flow hemodynamics were seen in bilateral subclavian arteries.  - Suggest follow up PRN.    Past Medical History:  Diagnosis Date   Acute bronchitis 09/11/2016   11/17 refractory   Acute cystitis without hematuria 05/17/2015   Acute kidney injury [facility])    Labs today   Acute right ankle pain 09/09/2018   Anticoagulant long-term use    Xarelto   Axillary pain     Bronchitis    Cellulitis    Cholecystitis    Chronic interstitial nephritis    CONJUNCTIVITIS, ACUTE 10/18/2010   Qualifier: Diagnosis of  By: Plotnikov MD, Georgina Quint    Coronary artery disease    Mild   D-dimer, elevated    Depression    Dysrhythmia    A. Fib/A. Flutter, AVT   Eye inflammation    GERD (gastroesophageal reflux disease)    History of interstitial nephritis 2016   chronic   History of kidney stones    has a small one found on xray   History of nuclear stress test 12/16/2014   Intermediate risk nuclear study w/ medium size moderate severity reversible defect in the basal and mid inferolateral and inferior wall (per dr cardiology note , dr Dietrich Pates did not think this was consistent with ischemia)/  normal LV function and wall motion, ef 75%   History of septic shock 01/22/2015   in setting Group A Strep Cellulits erysipelas/chest wall induration with streptoccocus basterium-- Severe sepsis, DIC, Acute respiratory failure with pulmonary edema, Acute Kidney failure with chronic interstitial nephritis   Hypertension    Hyponatremia    Migraines    on Zoloft for migraines   Mild carotid artery disease (HCC)    per duplex 08-30-2017 bilateral ICA 1-39%   OA (osteoarthritis) rheumotologist-  dr Zenovia Jordan   both knees,  shoulders, ankles   OSA on CPAP     moderate obstructive sleep apnea with an AHI of 23.4/h and oxygen desaturations as low as 84%.  Now on CPAP at 12 cm H2O.   PAF (paroxysmal atrial fibrillation) [facility]) 2009   cardiologist-- dr Dietrich Pates   Paroxysmal atrial flutter [facility])    a. dx 11/2017.   PSVT (paroxysmal supraventricular tachycardia) (HCC)    RA (rheumatoid arthritis) (HCC)    Wears contact lenses     Past Surgical History:  Procedure Laterality Date   BREAST BIOPSY Right 05/15/2017   PASH   CARDIOVERSION N/A 08/28/2021   Procedure: CARDIOVERSION;  Surgeon: Pricilla Riffle, MD;  Location: [facility]COPY;  Service: Cardiovascular;  Laterality:  N/A;   CESAREAN SECTION  1980   CHOLECYSTECTOMY N/A 11/16/2021   Procedure: LAPAROSCOPIC CHOLECYSTECTOMY;  Surgeon: Axel Filler, MD;  Location: [facility] OR;  Service: General;  Laterality: N/A;   COLONOSCOPY     ESOPHAGOGASTRODUODENOSCOPY N/A 02/08/2015   Procedure: ESOPHAGOGASTRODUODENOSCOPY (EGD);  Surgeon: Louis Meckel, MD;  Location: [facility] ENDOSCOPY;  Service: Endoscopy;  Laterality: N/A;   KNEE ARTHROSCOPY W/ MENISCAL REPAIR Left 08/2014    @[facility]    SHOULDER SURGERY Right 04/04/2016   TOTAL HIP ARTHROPLASTY Right 06/19/2022   Procedure: RIGHT TOTAL HIP ARTHROPLASTY ANTERIOR APPROACH;  Surgeon: Kathryne Hitch, MD;  Location: [facility] OR;  Service: Orthopedics;  Laterality: Right;   TOTAL KNEE ARTHROPLASTY Right 09/01/2018   Procedure: RIGHT  TOTAL KNEE ARTHROPLASTY;  Surgeon: Dannielle Huh, MD;  Location: [facility] ORS;  Service: Orthopedics;  Laterality: Right;    MEDICATIONS:  acetaminophen (TYLENOL) 500 MG tablet   ALPRAZolam (XANAX) 0.25 MG tablet   amLODipine (NORVASC) 2.5 MG tablet   cholecalciferol (VITAMIN D3) 25 MCG (1000 UT) tablet   ELIQUIS 5 MG TABS tablet   famotidine (PEPCID) 20 MG tablet   flecainide (TAMBOCOR) 50 MG tablet   furosemide (LASIX) 40 MG tablet   HYDROcodone-acetaminophen (NORCO/VICODIN) 5-325 MG tablet   hydroxychloroquine (PLAQUENIL) 200 MG tablet   metoprolol tartrate (LOPRESSOR) 25 MG tablet   potassium chloride (KLOR-CON) 10 MEQ tablet   Probiotic Product (ALIGN PO)   rosuvastatin (CRESTOR) 5 MG tablet   sertraline (ZOLOFT) 50 MG tablet   No current facility-administered medications for this encounter.    Shonna Chock, PA-C Surgical Short Stay/Anesthesiology [facility] Phone [phone] [facility] Phone [phone] 01/10/2024 6:05 PM

## 2024-01-13 ENCOUNTER — Encounter: Payer: Self-pay | Admitting: Orthopaedic Surgery

## 2024-01-14 ENCOUNTER — Ambulatory Visit: Payer: Medicare Other | Admitting: Internal Medicine

## 2024-01-14 ENCOUNTER — Ambulatory Visit: Admitting: Internal Medicine

## 2024-01-16 ENCOUNTER — Encounter: Payer: Self-pay | Admitting: Internal Medicine

## 2024-01-16 ENCOUNTER — Ambulatory Visit: Admitting: Internal Medicine

## 2024-01-16 ENCOUNTER — Ambulatory Visit: Admitting: Nurse Practitioner

## 2024-01-16 VITALS — BP 106/76 | HR 59 | Ht 65.0 in | Wt 245.2 lb

## 2024-01-16 DIAGNOSIS — J309 Allergic rhinitis, unspecified: Secondary | ICD-10-CM

## 2024-01-16 DIAGNOSIS — R053 Chronic cough: Secondary | ICD-10-CM | POA: Diagnosis not present

## 2024-01-16 DIAGNOSIS — Z01811 Encounter for preprocedural respiratory examination: Secondary | ICD-10-CM

## 2024-01-16 DIAGNOSIS — Z6839 Body mass index (BMI) 39.0-39.9, adult: Secondary | ICD-10-CM | POA: Diagnosis not present

## 2024-01-16 DIAGNOSIS — M0609 Rheumatoid arthritis without rheumatoid factor, multiple sites: Secondary | ICD-10-CM | POA: Diagnosis not present

## 2024-01-16 DIAGNOSIS — Z79899 Other long term (current) drug therapy: Secondary | ICD-10-CM | POA: Diagnosis not present

## 2024-01-16 DIAGNOSIS — E669 Obesity, unspecified: Secondary | ICD-10-CM | POA: Diagnosis not present

## 2024-01-16 DIAGNOSIS — M1991 Primary osteoarthritis, unspecified site: Secondary | ICD-10-CM | POA: Diagnosis not present

## 2024-01-16 DIAGNOSIS — I4891 Unspecified atrial fibrillation: Secondary | ICD-10-CM

## 2024-01-16 DIAGNOSIS — I480 Paroxysmal atrial fibrillation: Secondary | ICD-10-CM

## 2024-01-16 DIAGNOSIS — R0602 Shortness of breath: Secondary | ICD-10-CM

## 2024-01-16 LAB — POCT EXHALED NITRIC OXIDE: FeNO level (ppb): 17

## 2024-01-16 MED ORDER — LEVALBUTEROL TARTRATE 45 MCG/ACT IN AERO: 2.0000 | INHALATION_SPRAY | Freq: Four times a day (QID) | RESPIRATORY_TRACT | 12 refills | Status: AC | PRN

## 2024-01-16 MED ORDER — AZELASTINE-FLUTICASONE 137-50 MCG/ACT NA SUSP: 1.0000 | Freq: Every day | NASAL | 5 refills | Status: DC

## 2024-01-16 NOTE — Progress Notes (Signed)
 Amanda Wilkins    [id number]    [birth year]  Primary Care Physician:Wilkins, Amanda Quint, MD  Referring Physician: Tresa Garter, MD [address] [place],  [place] [address] Reason for Consultation: chronic cough Date of Consultation: 01/16/2024  Chief complaint:   Chief Complaint  Patient presents with   Consult    Persistent cough, started last year through march. She states she is also having congestion.     HPI:  Discussed the use of AI scribe software for clinical note transcription with the patient, who gave verbal consent to proceed.  History of Present Illness Amanda Wilkins is a 78 year old female who presents with chronic cough and for pre-surgical clearance. She was referred by her surgery doctor for pulmonary evaluation prior to knee surgery.  She has a chronic cough that initially began in December and lasted until March, diagnosed as bronchitis. Treatment included Levaquin, Tessalon pearls, and a cough syrup with codeine. A chest x-ray at that time was clear. The cough resolved but recurred about a week and a half ago, now accompanied by postnasal drainage, sinus congestion, and throat congestion. No itchy, watery eyes, and the cough does not wake her at night. She has not tried any new treatments for the current episode.  She has a history of recurrent bronchitis occurring every couple of years. No childhood respiratory diseases or hospitalizations for pneumonia or bronchitis. She has never smoked and has no significant secondhand smoke exposure. Pulmonary function tests in 2020 were normal.  She experiences shortness of breath when walking on inclines, a symptom present since childhood, evaluated by multiple doctors without a definitive diagnosis. Cardiopulmonary exercise testing in 2021 showed no limitations related to breathing or heart rate, but indicated suboptimal effort.  She has a history of atrial fibrillation and takes Eliquis. She  has experienced a rapid heart rate with inhaler use in the past, which she attributes to her AFib. She occasionally takes Lasix for swelling in her legs.  She worked for [employer] and lives with her husband. She has no pets. There is no family history of breathing issues.    Social history:  Occupation: retired, worked for Production assistant, radio, organized conferences Exposures: lives at home with husband. No pets.  Smoking history: never smoker. No passive smoke exposure.   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[employer])  Tobacco Use   Smoking status: Never   Smokeless tobacco: Never  Vaping Use   Vaping status: Never Used  Substance and Sexual Activity   Alcohol use: Yes    Alcohol/week: 5.0 standard drinks of alcohol    Types: 5 Glasses of wine per week   Drug use: Never   Sexual activity: Yes    Partners: Male    Birth control/protection: Post-menopausal    Comment: intercourse age 54, sexual partners more than 5    Relevant family history:  Family History  Problem Relation Age of Onset   Pancreatic cancer Brother    Lymphoma Mother    Prostate cancer Father        metastatic prostate cancer   Breast cancer Sister 79   Allergic rhinitis Sister    Urticaria Sister    Asthma Neg Hx    Eczema Neg Hx    Immunodeficiency Neg Hx    Atopy Neg Hx  Angioedema Neg Hx     Past Medical History:  Diagnosis Date   Acute bronchitis 09/11/2016   11/17 refractory   Acute cystitis without hematuria 05/17/2015   Acute kidney injury [facility])    Labs today   Acute right ankle pain 09/09/2018   Anticoagulant long-term use    Xarelto   Axillary pain    Bronchitis    Cellulitis    Cholecystitis    Chronic interstitial nephritis    CONJUNCTIVITIS, ACUTE 10/18/2010   Qualifier: Diagnosis of  By: Plotnikov MD, Amanda Quint    Coronary  artery disease    Mild   D-dimer, elevated    Depression    Dysrhythmia    A. Fib/A. Flutter, AVT   Eye inflammation    GERD (gastroesophageal reflux disease)    History of interstitial nephritis 2016   chronic   History of kidney stones    has a small one found on xray   History of nuclear stress test 12/16/2014   Intermediate risk nuclear study w/ medium size moderate severity reversible defect in the basal and mid inferolateral and inferior wall (per dr cardiology note , dr Dietrich Pates did not think this was consistent with ischemia)/  normal LV function and wall motion, ef 75%   History of septic shock 01/22/2015   in setting Group A Strep Cellulits erysipelas/chest wall induration with streptoccocus basterium-- Severe sepsis, DIC, Acute respiratory failure with pulmonary edema, Acute Kidney failure with chronic interstitial nephritis   Hypertension    Hyponatremia    Migraines    on Zoloft for migraines   Mild carotid artery disease (HCC)    per duplex 08-30-2017 bilateral ICA 1-39%   OA (osteoarthritis) rheumotologist-  dr Zenovia Jordan   both knees,  shoulders, ankles   OSA on CPAP     moderate obstructive sleep apnea with an AHI of 23.4/h and oxygen desaturations as low as 84%.  Now on CPAP at 12 cm H2O.   PAF (paroxysmal atrial fibrillation) [facility]) 2009   cardiologist-- dr Dietrich Pates   Paroxysmal atrial flutter [facility])    a. dx 11/2017.   PSVT (paroxysmal supraventricular tachycardia) (HCC)    RA (rheumatoid arthritis) (HCC)    Wears contact lenses     Past Surgical History:  Procedure Laterality Date   BREAST BIOPSY Right 05/15/2017   PASH   CARDIOVERSION N/A 08/28/2021   Procedure: CARDIOVERSION;  Surgeon: Pricilla Riffle, MD;  Location: [facility] ENDOSCOPY;  Service: Cardiovascular;  Laterality: N/A;   CESAREAN SECTION  1980   CHOLECYSTECTOMY N/A 11/16/2021   Procedure: LAPAROSCOPIC CHOLECYSTECTOMY;  Surgeon: Axel Filler, MD;  Location[facility] OR;  Service: General;   Laterality: N/A;   COLONOSCOPY     ESOPHAGOGASTRODUODENOSCOPY N/A 02/08/2015   Procedure: ESOPHAGOGASTRODUODENOSCOPY (EGD);  Surgeon: Louis Meckel, MD;  Location: [facility] ENDOSCOPY;  Service: Endoscopy;  Laterality: N/A;   KNEE ARTHROSCOPY W/ MENISCAL REPAIR Left 08/2014    @[facility]    SHOULDER SURGERY Right 04/04/2016   TOTAL HIP ARTHROPLASTY Right 06/19/2022   Procedure: RIGHT TOTAL HIP ARTHROPLASTY ANTERIOR APPROACH;  Surgeon: Kathryne Hitch, MD;  Location: [facility] OR;  Service: Orthopedics;  Laterality: Right;   TOTAL KNEE ARTHROPLASTY Right 09/01/2018   Procedure: RIGHT TOTAL KNEE ARTHROPLASTY;  Surgeon: Dannielle Huh, MD;  Location: [facility] ORS;  Service: Orthopedics;  Laterality: Right;     Physical Exam: Blood pressure 106/76, pulse (!) 59, height 5\' 5"  (1.651 m), weight 245 lb 3.2 oz (111.2 kg), SpO2 97%. Gen:  No acute distress, obese ENT:  cobblestoning in oropharynx no nasal polyps, mucus membranes moist Lungs:    No increased respiratory effort, symmetric chest wall excursion, clear to auscultation bilaterally, no wheezes or crackles CV:         Regular rate and rhythm; no murmurs, rubs, or gallops.  No pedal edema Abd:      + bowel sounds; soft, non-tender; no distension MSK: no acute synovitis of DIP or PIP joints, no mechanics hands.  Skin:      Warm and dry; no rashes Neuro: normal speech, no focal facial asymmetry Psych: alert and oriented x3, normal mood and affect   Data Reviewed/Medical Decision Making:  Independent interpretation of tests: Imaging:  Review of patient's chest xray Feb 2025 images revealed no acute cardiopulmonary process, mild peribronchial thickening. The patient's images have been independently reviewed by me.    PFTs: I have personally reviewed the patient's PFTs and normal pulmonary function    Latest Ref Rng & Units 07/31/2019   12:01 PM  PFT Results  FVC-Pre L 2.92   FVC-Predicted Pre % 93   FVC-Post L 2.89   FVC-Predicted Post % 92    Pre FEV1/FVC % % 78   Post FEV1/FCV % % 85   FEV1-Pre L 2.28   FEV1-Predicted Pre % 96   FEV1-Post L 2.46   DLCO uncorrected ml/min/mmHg 20.93   DLCO UNC% % 102   DLVA Predicted % 106   TLC L 5.36   TLC % Predicted % 101   RV % Predicted % 99     Labs:  Lab Results  Component Value Date   NA 135 01/09/2024   K 5.0 01/09/2024   CO2 23 01/09/2024   GLUCOSE 90 01/09/2024   BUN 12 01/09/2024   CREATININE 0.83 01/09/2024   CALCIUM 9.4 01/09/2024   GFR 50.29 (L) 11/26/2023   EGFR 72 07/18/2023   GFRNONAA >60 01/09/2024   Lab Results  Component Value Date   WBC 5.5 01/09/2024   HGB 11.8 (L) 01/09/2024   HCT 35.3 (L) 01/09/2024   MCV 97.8 01/09/2024   PLT 170 01/09/2024     Immunization status:  Immunization History  Administered Date(s) Administered   Fluad Quad(high Dose 65+) 08/03/2019, 07/20/2020, 08/22/2021, 07/18/2022   Fluad Trivalent(High Dose 65+) 07/31/2023   Influenza Split 09/24/2011, 09/24/2012   Influenza Whole 08/09/2010   Influenza, High Dose Seasonal PF 08/16/2016, 07/26/2017, 07/16/2018, 08/03/2019, 07/20/2020, 08/22/2021   Influenza,inj,Quad PF,6+ Mos 07/14/2013, 07/27/2014, 06/14/2015   PFIZER Comirnaty(Gray Top)Covid-19 Tri-Sucrose Vaccine 07/09/2020, 03/09/2021   PFIZER(Purple Top)SARS-COV-2 Vaccination 11/05/2019, 11/26/2019, 07/09/2020, 03/09/2021   PNEUMOCOCCAL CONJUGATE-20 06/28/2021   Pneumococcal Conjugate-13 05/25/2014   Pneumococcal Polysaccharide-23 06/21/2015   Tdap 01/06/2013   Zoster, Live 01/24/2011     I reviewed prior external note(s) from ortho  I reviewed the result(s) of the labs and imaging as noted above.   I have ordered feno  Assessment & Plan Chronic Cough Chronic cough likely due to postnasal drainage. Differential includes asthma. Clear chest x-ray. - Prescribe Xopenex (levalbuterol) inhaler as a trial for asthma. Needs xopanex due to atrial fibrillation history - Recommend azelastine and fluticasone nasal spray  for postnasal drainage. - Advise continuation of nasal spray up to the day of surgery. - Follow up post-surgery and inhaler trial.  Asthma Possible asthma due to exertional dyspnea and recurrent bronchitis. Clinical diagnosis based on treatment response. Xopenex chosen to minimize heart rate effects. - Prescribe Xopenex (levalbuterol) inhaler as a trial for asthma. -  Follow up post-surgery and inhaler trial. - feno obtained today 17 ppb, not elevated, less likely TH2 inflammation  Atrial Fibrillation Atrial fibrillation managed with Eliquis. Sensitivity to medications affecting heart rate considered in inhaler choice. - Continue Eliquis.  Preoperative Assessment for Knee Surgery No acute respiratory issues contraindicating surgery. Low risk based on ariscat index for postoperative pulmonary complications.  - Encourage aggressive incentive spirometry hourly both peri-operatively and post-operatively as tolerated  - Early ambulation and physical therapy as tolerated post-operatively - Adequate pain control especially in the setting of abdominal and thoracic surgery - Bronchodilators as needed for wheezing or shortness of breath - Send assessment to Dr. Magnus Ivan at [facility] for surgical planning. ARISCAT: Mazo et al. Anesthesiology [phone]    Return to Care: No follow-ups on file.  Durel Salts, MD Pulmonary and Critical Care Medicine [facility] [phone]  CC: Wilkins, Amanda Quint, MD

## 2024-01-16 NOTE — Anesthesia Preprocedure Evaluation (Signed)
Anesthesia Evaluation    Airway        Dental   Pulmonary           Cardiovascular hypertension,      Neuro/Psych    GI/Hepatic   Endo/Other    Renal/GU      Musculoskeletal   Abdominal   Peds  Hematology   Anesthesia Other Findings   Reproductive/Obstetrics                             Anesthesia Physical Anesthesia Plan  ASA:   Anesthesia Plan:    Post-op Pain Management:    Induction:   PONV Risk Score and Plan:   Airway Management Planned:   Additional Equipment:   Intra-op Plan:   Post-operative Plan:   Informed Consent:   Plan Discussed with:   Anesthesia Plan Comments: (PAT note written by Tambria Pfannenstiel, PA-C.  )       Anesthesia Quick Evaluation  

## 2024-01-16 NOTE — Patient Instructions (Addendum)
 It was a pleasure to see you today!  Please schedule follow up scheduled with myself in 3 months.  If my schedule is not open yet, we will contact you with a reminder closer to that time. Please call [phone] if you haven't heard from [place] a month before, and always call us sooner if issues or concerns arise. You can also send [place] a message through MyChart, but but aware that this is not to be used for urgent issues and it may take up to 5-7 days to receive a reply. Please be aware that you will likely be able to view your results before I have a chance to respond to them. Please give [place] 5 business days to respond to any non-urgent results.    Good luck with your surgery.  For the chronic cough related to allergic rhinitis and postnasal drainage please start Dymista nasal spray.  Dymista - 1 spray on each side of your nose twice a day for first week, then 1 spray on each side.   Instructions for use: If you also use a saline nasal spray or rinse, use that first. Position the head with the chin slightly tucked. Use the right hand to spray into the left nostril and the right hand to spray into the left nostril.   Point the bottle away from the septum of your nose (cartilage that divides the two sides of your nose).  Hold the nostril closed on the opposite side from where you will spray Spray once and gently sniff to pull the medicine into the higher parts of your nose.  Don't sniff too hard as the medicine will drain down the back of your throat instead. Repeat with a second spray on the same side if prescribed. Repeat on the other side of your nose.  We will try Xopenex inhaler to see if this helps your shortness of breath symptoms for possible asthma  Take the levalbuterol/xopanex rescue inhaler every 4 to 6 hours as needed for wheezing or shortness of breath. You can also take it 15 minutes before exercise or exertional activity.

## 2024-01-17 ENCOUNTER — Ambulatory Visit: Admitting: Rehabilitative and Restorative Service Providers"

## 2024-01-17 ENCOUNTER — Encounter: Payer: Self-pay | Admitting: Rehabilitative and Restorative Service Providers"

## 2024-01-17 DIAGNOSIS — M25551 Pain in right hip: Secondary | ICD-10-CM | POA: Diagnosis not present

## 2024-01-17 DIAGNOSIS — M25562 Pain in left knee: Secondary | ICD-10-CM | POA: Diagnosis not present

## 2024-01-17 DIAGNOSIS — M6281 Muscle weakness (generalized): Secondary | ICD-10-CM | POA: Diagnosis not present

## 2024-01-17 DIAGNOSIS — M79604 Pain in right leg: Secondary | ICD-10-CM

## 2024-01-17 DIAGNOSIS — G8929 Other chronic pain: Secondary | ICD-10-CM

## 2024-01-17 DIAGNOSIS — R262 Difficulty in walking, not elsewhere classified: Secondary | ICD-10-CM | POA: Diagnosis not present
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
## 2024-01-19 ENCOUNTER — Other Ambulatory Visit: Payer: Self-pay | Admitting: Internal Medicine

## 2024-01-20 ENCOUNTER — Encounter: Payer: Self-pay | Admitting: Rehabilitative and Restorative Service Providers"

## 2024-01-20 ENCOUNTER — Other Ambulatory Visit (HOSPITAL_COMMUNITY): Payer: Self-pay

## 2024-01-20 ENCOUNTER — Ambulatory Visit (INDEPENDENT_AMBULATORY_CARE_PROVIDER_SITE_OTHER): Admitting: Rehabilitative and Restorative Service Providers"

## 2024-01-20 ENCOUNTER — Telehealth: Payer: Self-pay

## 2024-01-20 ENCOUNTER — Ambulatory Visit: Attending: Physician Assistant | Admitting: Internal Medicine

## 2024-01-20 ENCOUNTER — Telehealth: Payer: Self-pay | Admitting: Internal Medicine

## 2024-01-20 ENCOUNTER — Encounter: Payer: Self-pay | Admitting: Internal Medicine

## 2024-01-20 VITALS — BP 122/70 | HR 68 | Ht 65.0 in | Wt 247.0 lb

## 2024-01-20 DIAGNOSIS — M79604 Pain in right leg: Secondary | ICD-10-CM | POA: Diagnosis not present

## 2024-01-20 DIAGNOSIS — M25562 Pain in left knee: Secondary | ICD-10-CM

## 2024-01-20 DIAGNOSIS — M6281 Muscle weakness (generalized): Secondary | ICD-10-CM | POA: Diagnosis not present

## 2024-01-20 DIAGNOSIS — M25551 Pain in right hip: Secondary | ICD-10-CM

## 2024-01-20 DIAGNOSIS — I480 Paroxysmal atrial fibrillation: Secondary | ICD-10-CM | POA: Insufficient documentation

## 2024-01-20 DIAGNOSIS — R262 Difficulty in walking, not elsewhere classified: Secondary | ICD-10-CM | POA: Diagnosis not present

## 2024-01-20 DIAGNOSIS — G8929 Other chronic pain: Secondary | ICD-10-CM

## 2024-01-20 DIAGNOSIS — I10 Essential (primary) hypertension: Secondary | ICD-10-CM | POA: Insufficient documentation
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
## 2024-01-20 NOTE — Telephone Encounter (Signed)
 Patient wants a call back directly from [employer].  Patient did not wants to say what this was regarding.

## 2024-01-20 NOTE — Telephone Encounter (Signed)
 Pt called to let Dr Tenny Craw know that her Knee Surgery planned for tomorrow had been cancelled due to her elevated BMI not in the right range for the surgery... she is following up with Orthopaedics 02/03/24 and is very disappointed she did not know this sooner and wanted to let us know.

## 2024-01-20 NOTE — Telephone Encounter (Signed)
*  [facility]  Pharmacy Patient Advocate Encounter  Received notification from [employer] that Prior Authorization for Levalbuterol Tartrate 45MCG/ACT aerosol  has been APPROVED from 01/20/2024 to 01/19/2025   PA #/Case ID/Reference #: [id number]

## 2024-01-20 NOTE — Progress Notes (Unsigned)
 Cardiology Office Note:   Date:  01/20/2024  ID:  Amanda Wilkins, DOB March 30, 1946, MRN [id number] PCP: Tresa Garter, MD   [facility] Cardiologist:  Dietrich Pates, MD    History of Present Illness:   Discussed the use of AI scribe software for clinical note transcription with the patient, who gave verbal consent to proceed.  History of Present Illness   The patient is a 78 year old individual with a complex medical history, including paroxysmal atrial flutter and atrial fibrillation managed with Eliquis and Flecainide, hypertension, chronic interstitial nephritis, obstructive sleep apnea managed with CPAP, paroxysmal SVT, mild carotid artery disease, hyperlipidemia, aortic atherosclerosis, mild mitral regurgitation, and chronic lower extremity edema. The patient has undergone multiple cardioversions and per most recent ultrasound has preserved left ventricular ejection fraction, mild left ventricular hypertrophy, grade two diastolic dysfunction, mild enlargement of the right ventricle, mildly elevated pulmonary artery systolic pressure, and moderately dilated left and right atria.  The patient recently visited the emergency department with symptoms of atrial fibrillation/sensation of rapid HR with simultaneous Apple Watch alerts for afib. On personal review of EKG tracings from this visit showed sinus rhythm with PACs and very low amplitude P waves, but no atrial fibrillation. Patient reports feeling conversion to NSR spontaneously in the ED, but is not sure if this occurred before or after EKG tracings. She denies known occurrence of afib since 2022 but does note frequent episodes of "racing heart beat." Per patient, the difference in how she feels when in afib vs SVT is difficult to describe but she is confident in knowing the difference.   Aside from this episode of arrhythmia, patient reported feeling generally okay since her last cardiology office visit. She does describe  chronic exertional dyspnea which she says has occurred for years when she walks up a hill or exerts heavily. She describes developing a cough in December that has lingered since. Per notes, patient saw urgent care and was diagnosed with acute bronchitis. She still has a productive cough and her PCP prescribed doxycycline on 1/15. Patient only took one dose and stopped after developing a rash, has not yet called back to her PCP.   The patient also reported a history of tachycardia, which occurs every couple of months and has been present since she was in her twenties. The tachycardia is often triggered by body movements and lying on the left side.  The patient has a history of high blood pressure, which has been managed with metoprolol. The patient reported that her blood pressure tends to increase during episodes of AFib. The patient has also been taking Lasix occasionally for chronic lower extremity edema, but reported that the swelling does not completely resolve. The patient has not tried wearing compression stockings for the edema.  Since seen the pt says she notes occasional fluttering in chest     usually when in bed on left side   Does not have watch on at time       Breathing is about the same     Studies Reviewed:    EKG:   No new       Risk Assessment/Calculations:    CHA2DS2-VASc Score = 5   This indicates a 7.2% annual risk of stroke. The patient's score is based upon: CHF History: 0 HTN History: 1 Diabetes History: 0 Stroke History: 0 Vascular Disease History: 1 Age Score: 2 Gender Score: 1       Physical Exam:   VS:  BP 122/70   Pulse  68   Ht 5\' 5"  (1.651 m)   Wt 112 kg   BMI 41.10 kg/m    Wt Readings from Last 3 Encounters:  01/20/24 112 kg  01/16/24 111.2 kg  01/09/24 112.9 kg     Physical Exam Vitals reviewed.  Constitutional:      Appearance: Normal appearance.  HENT:     Head: Normocephalic.     Nose: Nose normal.  Eyes:     Pupils: Pupils are  equal, round, and reactive to light.  Cardiovascular:     Rate and Rhythm: Normal rate and regular rhythm.     Pulses: Normal pulses.     Heart sounds: Normal heart sounds. No murmur heard.    No friction rub. No gallop.  Pulmonary:     Effort: Pulmonary effort is normal.     Breath sounds: Normal breath sounds.  Abdominal:     General: Abdomen is flat.  Musculoskeletal:     Right lower leg: No edema.     Left lower leg: No edema.  Skin:    General: Skin is warm and dry.     Capillary Refill: Capillary refill takes less than 2 seconds.  Neurological:     General: No focal deficit present.     Mental Status: She is alert and oriented to person, place, and time.  Psychiatric:        Mood and Affect: Mood normal.        Behavior: Behavior normal.        Thought Content: Thought content normal.        Judgment: Judgment normal.     ASSESSMENT AND PLAN:     Assessment and Plan    Atrial Fibrillation/Flutter Patient with recent ED visit for Apple Watch reported episode of Afib. No Afib documented on ED ECGs. These show sinus rhythm with first degree AVB and PACs. Patient admits to stress about cost of a dental procedure and has also been sick with bronchitis. Currently on Metoprolol and Flecainide, endorses compliance.  -Order 14-day Zio to further assess rhythm. Not clear by ED workup whether patient actually had afib or just atrial arrhythmia with PACs.  -Consider referral to electrophysiology team depending on heart monitor results. -Continue Flecainide and Metoprolol.   Chronic Shortness of Breath Long-standing issue, worsened with recent cough since December. Recent bronchitis may be contributing to exacerbation. -Consider pulmonary function testing after resolution of acute bronchitis symptoms. Echocardiogram has not shown significant heart failure.   HFpEF Lower Extremity Edema Chronic issue, not improved with cessation of Amlodipine. No significant heart failure noted  on echocardiogram, LVEF 60-65% on most recent limited echo in December, with grade II DD on January echo.  -Recommend use of compression stockings. -Continue occasional use of Lasix as needed. -Patient previously on Jardiance, stopped due to increased frequency of palpitations. Patient states palpitations less frequent now that she's off. She prefers to avoid SGLT2i medications at this time.   Aortic atherosclerosis Chest imaging with aortic atherosclerosis noted. -Continue lipid management and BP management as below  Hyperlipidemia Well-controlled with Crestor. LDL at goal. -Continue Crestor.  Hypertension Well-controlled with Metoprolol. Previous intolerance to Amlodipine. -Continue Metoprolol.  Follow-up in 3-4 months.              Signed, Dietrich Pates, MD

## 2024-01-21 ENCOUNTER — Encounter (HOSPITAL_COMMUNITY): Payer: Self-pay | Admitting: Vascular Surgery

## 2024-01-21 ENCOUNTER — Ambulatory Visit (HOSPITAL_COMMUNITY): Admission: RE | Admit: 2024-01-21 | Source: Home / Self Care | Admitting: Orthopaedic Surgery

## 2024-01-21 ENCOUNTER — Encounter (HOSPITAL_COMMUNITY): Admission: RE | Payer: Self-pay | Source: Home / Self Care

## 2024-01-21 DIAGNOSIS — M1712 Unilateral primary osteoarthritis, left knee: Secondary | ICD-10-CM

## 2024-01-21 SURGERY — ARTHROPLASTY, KNEE, TOTAL
Anesthesia: Spinal | Site: Knee | Laterality: Left

## 2024-01-22 ENCOUNTER — Telehealth: Payer: Self-pay

## 2024-01-22 NOTE — Telephone Encounter (Signed)
 Copied from CRM [id number]. Topic: General - Other >> Jan 22, 2024 10:45 AM Brennan Bailey S wrote: Reason for CRM: patient calling back to speak with Jayke Caul cma. Please call patient back.  Spoke with patient regarding prior message. Patient has been advised of approval for Xopenex.Patient was able to pick up her script.Nothing else further needed

## 2024-01-22 NOTE — Telephone Encounter (Signed)
 Copied from CRM [id number]. Topic: Clinical - Prescription Issue >> Jan 21, 2024  4:21 PM Amanda Wilkins wrote: Reason for CRM: Patient states levalbuterol [facility] HFA) 45 MCG/ACT inhaler has been denied by her insurance stating its not on their list - patient received Wilkins phone number for it to be added on to their list. Patient wants to confirm what is the process for this or should she be prescribed an alternative.   Callback number: [phone]  ATC x 1LVM for patient to call our office back will send patient Wilkins mychart message .

## 2024-02-03 ENCOUNTER — Encounter: Admitting: Orthopaedic Surgery

## 2024-02-03 ENCOUNTER — Other Ambulatory Visit: Payer: Self-pay | Admitting: Internal Medicine

## 2024-02-12 ENCOUNTER — Encounter: Payer: Self-pay | Admitting: Internal Medicine

## 2024-02-12 ENCOUNTER — Ambulatory Visit (INDEPENDENT_AMBULATORY_CARE_PROVIDER_SITE_OTHER): Admitting: Internal Medicine

## 2024-02-12 VITALS — BP 130/72 | HR 55 | Temp 97.7°F | Ht 65.0 in | Wt 250.2 lb

## 2024-02-12 DIAGNOSIS — R609 Edema, unspecified: Secondary | ICD-10-CM

## 2024-02-12 DIAGNOSIS — I10 Essential (primary) hypertension: Secondary | ICD-10-CM

## 2024-02-12 DIAGNOSIS — G8929 Other chronic pain: Secondary | ICD-10-CM | POA: Diagnosis not present

## 2024-02-12 DIAGNOSIS — M545 Low back pain, unspecified: Secondary | ICD-10-CM

## 2024-02-12 DIAGNOSIS — Z1211 Encounter for screening for malignant neoplasm of colon: Secondary | ICD-10-CM

## 2024-02-12 LAB — COMPREHENSIVE METABOLIC PANEL WITH GFR
ALT: 24 U/L (ref 0–35)
AST: 24 U/L (ref 0–37)
Albumin: 4.4 g/dL (ref 3.5–5.2)
Alkaline Phosphatase: 64 U/L (ref 39–117)
BUN: 15 mg/dL (ref 6–23)
CO2: 26 meq/L (ref 19–32)
Calcium: 9.4 mg/dL (ref 8.4–10.5)
Chloride: 98 meq/L (ref 96–112)
Creatinine, Ser: 0.82 mg/dL (ref 0.40–1.20)
GFR: 69.11 mL/min (ref >60.00)
Glucose, Bld: 109 mg/dL — ABNORMAL HIGH (ref 70–99)
Potassium: 4.5 meq/L (ref 3.5–5.1)
Sodium: 132 meq/L — ABNORMAL LOW (ref 135–145)
Total Bilirubin: 0.8 mg/dL (ref 0.2–1.2)
Total Protein: 6.9 g/dL (ref 6.0–8.3)

## 2024-02-12 LAB — CBC WITH DIFFERENTIAL/PLATELET
Basophils Absolute: 0 10*3/uL (ref 0.0–0.1)
Basophils Relative: 0.8 % (ref 0.0–3.0)
Eosinophils Absolute: 0.2 10*3/uL (ref 0.0–0.7)
Eosinophils Relative: 3.6 % (ref 0.0–5.0)
HCT: 36.3 % (ref 36.0–46.0)
Hemoglobin: 12.3 g/dL (ref 12.0–15.0)
Lymphocytes Relative: 30.4 % (ref 12.0–46.0)
Lymphs Abs: 1.5 10*3/uL (ref 0.7–4.0)
MCHC: 33.9 g/dL (ref 30.0–36.0)
MCV: 98.2 fl (ref 78.0–100.0)
Monocytes Absolute: 0.4 10*3/uL (ref 0.1–1.0)
Monocytes Relative: 7.5 % (ref 3.0–12.0)
Neutro Abs: 2.9 10*3/uL (ref 1.4–7.7)
Neutrophils Relative %: 57.7 % (ref 43.0–77.0)
Platelets: 159 10*3/uL (ref 150.0–400.0)
RBC: 3.7 Mil/uL — ABNORMAL LOW (ref 3.87–5.11)
RDW: 14.2 % (ref 11.5–15.5)
WBC: 5 10*3/uL (ref 4.0–10.5)

## 2024-02-12 LAB — T4, FREE: Free T4: 0.49 ng/dL — ABNORMAL LOW (ref 0.60–1.60)

## 2024-02-12 LAB — TSH: TSH: 3.17 u[IU]/mL (ref 0.35–5.50)

## 2024-02-12 MED ORDER — HYDROCODONE-ACETAMINOPHEN 5-325 MG PO TABS: 1.0000 | ORAL_TABLET | Freq: Four times a day (QID) | ORAL | 0 refills | Status: DC | PRN

## 2024-02-12 MED ORDER — LIDOCAINE-PRILOCAINE 2.5-2.5 % EX CREA: 1.0000 | TOPICAL_CREAM | CUTANEOUS | 0 refills | Status: DC | PRN

## 2024-02-12 MED ORDER — CEPHALEXIN 500 MG PO CAPS: 500.0000 mg | ORAL_CAPSULE | Freq: Four times a day (QID) | ORAL | 0 refills | Status: DC

## 2024-02-12 NOTE — Assessment & Plan Note (Signed)
 Compression sleeves - on in am, off at HS

## 2024-02-12 NOTE — Assessment & Plan Note (Addendum)
 Worse due to OA Norco prn  Potential benefits of a short/long term opioids use as well as potential risks (i.e. addiction risk, apnea etc) and complications (i.e. Somnolence, constipation and others) were explained to the patient and were aknowledged.

## 2024-02-12 NOTE — Patient Instructions (Addendum)
 Compression sleeves - on in am, off at HS  [employer]

## 2024-02-12 NOTE — Progress Notes (Signed)
 Subjective:  Patient ID: Amanda Wilkins, female    DOB: 29-Oct-1945  Age: 78 y.o. MRN: [id number]  CC: Results (From most recent lab results (01/09/24) specifically CBC. Patient notes inability to maintain weight loss. Also wants to note past gallbladder removal patient has been having thinner bowel movements (possible GI referral)./Would like suggestion on getting eyebrows tattooed and the numbing gel that they use (zensa))   HPI Amanda Wilkins presents for edema, knee OA, SVT  Outpatient Medications Prior to Visit  Medication Sig Dispense Refill   acetaminophen  (TYLENOL ) 500 MG tablet Take 1,000 mg by mouth every 6 (six) hours as needed for moderate pain (pain score 4-6).     ALPRAZolam  (XANAX ) 0.25 MG tablet Take 1 tablet (0.25 mg total) by mouth 2 (two) times daily as needed for anxiety. 60 tablet 3   cholecalciferol  (VITAMIN D3) 25 MCG (1000 UT) tablet Take 1,000 Units by mouth daily.     famotidine  (PEPCID ) 20 MG tablet TAKE 1 TABLET(20 MG) BY MOUTH AT BEDTIME 90 tablet 3   flecainide  (TAMBOCOR ) 50 MG tablet TAKE 1 AND 1/2 TABLETS(75 MG) BY MOUTH TWICE DAILY 270 tablet 3   fluticasone  (FLONASE) 50 MCG/ACT nasal spray Place 2 sprays into both nostrils daily.     hydroxychloroquine  (PLAQUENIL ) 200 MG tablet Take 400 mg by mouth every morning.     metoprolol  tartrate (LOPRESSOR ) 25 MG tablet TAKE 1 TABLET BY MOUTH THREE TIMES DAILY CAN TAKE ADDITIONAL TABLET AS NEEDED FOR PALPITATIONS 315 tablet 2   potassium chloride  (KLOR-CON ) 10 MEQ tablet Take 2 tablets by mouth daily (Patient taking differently: Take 10 mEq by mouth daily as needed (when taking lasix ).) 180 tablet 3   Probiotic Product (ALIGN PO) Take 1 capsule by mouth daily.     rosuvastatin  (CRESTOR ) 5 MG tablet Take 1 tablet (5 mg total) by mouth daily. 90 tablet 3   sertraline  (ZOLOFT ) 50 MG tablet TAKE 1 TABLET BY MOUTH DAILY 90 tablet 1   amLODipine  (NORVASC ) 2.5 MG tablet Take 2.5 mg by mouth daily as needed (high  bp).     ELIQUIS  5 MG TABS tablet TAKE 1 TABLET(5 MG) BY MOUTH TWICE DAILY (Patient not taking: No sig reported) 60 tablet 5   furosemide  (LASIX ) 40 MG tablet Take 1 tablet (40 mg total) by mouth daily. (Patient taking differently: Take 40 mg by mouth as needed for fluid or edema.) 90 tablet 3   levalbuterol  (XOPENEX  HFA) 45 MCG/ACT inhaler Inhale 2 puffs into the lungs every 6 (six) hours as needed for wheezing. (Patient not taking: Reported on 02/12/2024) 1 each 12   HYDROcodone -acetaminophen  (NORCO/VICODIN) 5-325 MG tablet Take 1 tablet by mouth daily as needed for moderate pain (pain score 4-6). (Patient not taking: Reported on 01/20/2024)     No facility-administered medications prior to visit.    ROS: Review of Systems  Constitutional:  Negative for activity change, appetite change, chills, fatigue and unexpected weight change.  HENT:  Negative for congestion, mouth sores and sinus pressure.   Eyes:  Negative for visual disturbance.  Respiratory:  Negative for cough and chest tightness.   Gastrointestinal:  Negative for abdominal pain and nausea.  Genitourinary:  Negative for difficulty urinating, frequency and vaginal pain.  Musculoskeletal:  Negative for back pain and gait problem.  Skin:  Negative for pallor and rash.  Neurological:  Negative for dizziness, tremors, weakness, numbness and headaches.  Psychiatric/Behavioral:  Negative for confusion, sleep disturbance and suicidal ideas. The patient is nervous/anxious.  Objective:  BP 130/72   Pulse (!) 55   Temp 97.7 F (36.5 C)   Ht 5\' 5"  (1.651 m)   Wt 250 lb 3.2 oz (113.5 kg)   SpO2 (!) 2%   BMI 41.64 kg/m   BP Readings from Last 3 Encounters:  02/12/24 130/72  01/20/24 122/70  01/16/24 106/76    Wt Readings from Last 3 Encounters:  02/12/24 250 lb 3.2 oz (113.5 kg)  01/20/24 247 lb (112 kg)  01/16/24 245 lb 3.2 oz (111.2 kg)    Physical Exam Constitutional:      General: She is not in acute distress.     Appearance: She is well-developed.  HENT:     Head: Normocephalic.     Right Ear: External ear normal.     Left Ear: External ear normal.     Nose: Nose normal.  Eyes:     General:        Right eye: No discharge.        Left eye: No discharge.     Conjunctiva/sclera: Conjunctivae normal.     Pupils: Pupils are equal, round, and reactive to light.  Neck:     Thyroid : No thyromegaly.     Vascular: No JVD.     Trachea: No tracheal deviation.  Cardiovascular:     Rate and Rhythm: Normal rate and regular rhythm.     Heart sounds: Normal heart sounds.  Pulmonary:     Effort: No respiratory distress.     Breath sounds: No stridor. No wheezing.  Abdominal:     General: Bowel sounds are normal. There is no distension.     Palpations: Abdomen is soft. There is no mass.     Tenderness: There is no abdominal tenderness. There is no guarding or rebound.  Musculoskeletal:        General: No tenderness.     Cervical back: Normal range of motion and neck supple. No rigidity.     Right lower leg: Edema present.     Left lower leg: Edema present.  Lymphadenopathy:     Cervical: No cervical adenopathy.  Skin:    Findings: No erythema or rash.  Neurological:     Cranial Nerves: No cranial nerve deficit.     Motor: No abnormal muscle tone.     Coordination: Coordination normal.     Deep Tendon Reflexes: Reflexes normal.  Psychiatric:        Behavior: Behavior normal.        Thought Content: Thought content normal.        Judgment: Judgment normal.    Trace edema B  Lab Results  Component Value Date   WBC 5.5 01/09/2024   HGB 11.8 (L) 01/09/2024   HCT 35.3 (L) 01/09/2024   PLT 170 01/09/2024   GLUCOSE 90 01/09/2024   CHOL 168 03/06/2022   TRIG 52 03/06/2022   HDL 98 03/06/2022   LDLCALC 59 03/06/2022   ALT 25 11/26/2023   AST 26 11/26/2023   NA 135 01/09/2024   K 5.0 01/09/2024   CL 101 01/09/2024   CREATININE 0.83 01/09/2024   BUN 12 01/09/2024   CO2 23 01/09/2024   TSH  3.300 02/26/2023   INR 1.1 07/07/2022   HGBA1C 5.7 10/30/2023    No results found.  Assessment & Plan:   Problem List Items Addressed This Visit     Low back pain   Worse due to OA Norco prn  Potential benefits of a short/long term  opioids use as well as potential risks (i.e. addiction risk, apnea etc) and complications (i.e. Somnolence, constipation and others) were explained to the patient and were aknowledged.       Relevant Medications   HYDROcodone -acetaminophen  (NORCO/VICODIN) 5-325 MG tablet   Essential hypertension   Relevant Orders   CBC with Differential/Platelet   Comprehensive metabolic panel with GFR   T4, free   TSH   Edema - Primary   Compression sleeves - on in am, off at HS      Relevant Orders   CBC with Differential/Platelet   Comprehensive metabolic panel with GFR   T4, free   TSH   Other Visit Diagnoses       Screening for colon cancer       Relevant Orders   Cologuard   CBC with Differential/Platelet   Comprehensive metabolic panel with GFR   T4, free   TSH         Meds ordered this encounter  Medications   HYDROcodone -acetaminophen  (NORCO/VICODIN) 5-325 MG tablet    Sig: Take 1 tablet by mouth every 6 (six) hours as needed for severe pain (pain score 7-10).    Dispense:  20 tablet    Refill:  0   lidocaine -prilocaine  (EMLA ) cream    Sig: Apply 1 Application topically as needed.    Dispense:  30 g    Refill:  0   cephALEXin  (KEFLEX ) 500 MG capsule    Sig: Take 1 capsule (500 mg total) by mouth 4 (four) times daily.    Dispense:  20 capsule    Refill:  0      Follow-up: Return in about 3 months (around 05/13/2024) for a follow-up visit.  Anitra Barn, MD

## 2024-02-13 ENCOUNTER — Other Ambulatory Visit: Payer: Self-pay | Admitting: Internal Medicine

## 2024-02-13 ENCOUNTER — Encounter: Payer: Self-pay | Admitting: Internal Medicine

## 2024-02-13 DIAGNOSIS — R7989 Other specified abnormal findings of blood chemistry: Secondary | ICD-10-CM

## 2024-02-13 DIAGNOSIS — R609 Edema, unspecified: Secondary | ICD-10-CM

## 2024-02-19 ENCOUNTER — Ambulatory Visit: Admitting: Internal Medicine

## 2024-02-28 DIAGNOSIS — Z1211 Encounter for screening for malignant neoplasm of colon: Secondary | ICD-10-CM | POA: Diagnosis not present

## 2024-03-03 ENCOUNTER — Institutional Professional Consult (permissible substitution): Payer: Medicare Other | Admitting: Internal Medicine

## 2024-03-04 ENCOUNTER — Other Ambulatory Visit (INDEPENDENT_AMBULATORY_CARE_PROVIDER_SITE_OTHER)

## 2024-03-04 DIAGNOSIS — R609 Edema, unspecified: Secondary | ICD-10-CM

## 2024-03-04 DIAGNOSIS — R7989 Other specified abnormal findings of blood chemistry: Secondary | ICD-10-CM | POA: Diagnosis not present

## 2024-03-04 LAB — TSH: TSH: 2.32 u[IU]/mL (ref 0.35–5.50)

## 2024-03-04 LAB — T4, FREE: Free T4: 0.57 ng/dL — ABNORMAL LOW (ref 0.60–1.60)

## 2024-03-04 LAB — T3, FREE: T3, Free: 2.8 pg/mL (ref 2.3–4.2)

## 2024-03-06 ENCOUNTER — Ambulatory Visit: Payer: Self-pay | Admitting: Internal Medicine

## 2024-03-06 LAB — COLOGUARD: COLOGUARD: NEGATIVE

## 2024-03-16 DIAGNOSIS — M25562 Pain in left knee: Secondary | ICD-10-CM | POA: Diagnosis not present

## 2024-03-17 ENCOUNTER — Telehealth: Payer: Self-pay | Admitting: Cardiology

## 2024-03-17 ENCOUNTER — Telehealth: Payer: Self-pay | Admitting: *Deleted

## 2024-03-17 DIAGNOSIS — I10 Essential (primary) hypertension: Secondary | ICD-10-CM

## 2024-03-17 DIAGNOSIS — G4733 Obstructive sleep apnea (adult) (pediatric): Secondary | ICD-10-CM

## 2024-03-17 NOTE — Telephone Encounter (Signed)
   Pre-operative Risk Assessment    Patient Name: Amanda Wilkins  DOB: 09/18/1946 MRN: [id number]   Date of last office visit: 01/20/24 DR. ROSS Date of next office visit: NONE   Request for Surgical Clearance    Procedure:  LEFT TOTAL KNEE ARTHROPLASTY  Date of Surgery:  Clearance 07/06/24                                Surgeon:  DR. Liliane Rei Surgeon's Group or Practice Name:  [facility] Phone number:  [phone] KERRI MAZE Fax number:  [phone]   Type of Clearance Requested:   - Medical ; NONE INDICATED ON FORM TO BE HELD; HOWEVER MED LIST REFLECTS ELIQUIS  THOUGH DOES ALSO SHOW A NOTE STATING PT NOT TAKING   Type of Anesthesia:  CHOICE   Additional requests/questions:    Princeton Broom   03/17/2024, 12:43 PM

## 2024-03-17 NOTE — Telephone Encounter (Signed)
 Patient says [employer] emailed her to inform her that current order expired and they are needing a new prescription for CPAP supplies.

## 2024-03-18 ENCOUNTER — Telehealth: Payer: Self-pay

## 2024-03-18 NOTE — Telephone Encounter (Signed)
 Preop televisit now scheduled, med rec and consent done.

## 2024-03-18 NOTE — Telephone Encounter (Signed)
   Name: Amanda Wilkins  DOB: March 22, 1946  MRN: [id number]  Primary Cardiologist: Ola Berger, MD   Preoperative team, please contact this patient and set up a phone call appointment for further preoperative risk assessment. Please obtain consent and complete medication review. Thank you for your help.  I confirm that guidance regarding antiplatelet and oral anticoagulation therapy has been completed and, if necessary, noted below.  Per office protocol, patient can hold Eliquis  for 3 days prior to procedure.   Patient will not need bridging with Lovenox  (enoxaparin ) around procedure.    I also confirmed the patient resides in the state of [place] . As per [facility] Medical Board telemedicine laws, the patient must reside in the state in which the provider is licensed.   Francene Ing, Retha Cast, NP 03/18/2024, 12:27 PM [facility]

## 2024-03-18 NOTE — Telephone Encounter (Signed)
  Patient Consent for Virtual Visit        Amanda Wilkins has provided verbal consent on 03/18/2024 for a virtual visit (video or telephone).   CONSENT FOR VIRTUAL VISIT FOR:  Amanda Wilkins  By participating in this virtual visit I agree to the following:  I hereby voluntarily request, consent and authorize [facility] and its employed or contracted physicians, physician assistants, nurse practitioners or other licensed health care professionals (the Practitioner), to provide me with telemedicine health care services (the "Services") as deemed necessary by the treating Practitioner. I acknowledge and consent to receive the Services by the Practitioner via telemedicine. I understand that the telemedicine visit will involve communicating with the Practitioner through live audiovisual communication technology and the disclosure of certain medical information by electronic transmission. I acknowledge that I have been given the opportunity to request an in-person assessment or other available alternative prior to the telemedicine visit and am voluntarily participating in the telemedicine visit.  I understand that I have the right to withhold or withdraw my consent to the use of telemedicine in the course of my care at any time, without affecting my right to future care or treatment, and that the Practitioner or I may terminate the telemedicine visit at any time. I understand that I have the right to inspect all information obtained and/or recorded in the course of the telemedicine visit and may receive copies of available information for a reasonable fee.  I understand that some of the potential risks of receiving the Services via telemedicine include:  Delay or interruption in medical evaluation due to technological equipment failure or disruption; Information transmitted may not be sufficient (e.g. poor resolution of images) to allow for appropriate medical decision making by the  Practitioner; and/or  In rare instances, security protocols could fail, causing a breach of personal health information.  Furthermore, I acknowledge that it is my responsibility to provide information about my medical history, conditions and care that is complete and accurate to the best of my ability. I acknowledge that Practitioner's advice, recommendations, and/or decision may be based on factors not within their control, such as incomplete or inaccurate data provided by me or distortions of diagnostic images or specimens that may result from electronic transmissions. I understand that the practice of medicine is not an exact science and that Practitioner makes no warranties or guarantees regarding treatment outcomes. I acknowledge that a copy of this consent can be made available to me via my patient portal [facility] MyChart), or I can request a printed copy by calling the office of [facility].    I understand that my insurance will be billed for this visit.   I have read or had this consent read to me. I understand the contents of this consent, which adequately explains the benefits and risks of the Services being provided via telemedicine.  I have been provided ample opportunity to ask questions regarding this consent and the Services and have had my questions answered to my satisfaction. I give my informed consent for the services to be provided through the use of telemedicine in my medical care

## 2024-03-18 NOTE — Telephone Encounter (Signed)
 Patient with diagnosis of afib on Eliquis  for anticoagulation.    Procedure: LEFT TOTAL KNEE ARTHROPLASTY Date of procedure: 07/06/2024   CHA2DS2-VASc Score = 5   This indicates a 7.2% annual risk of stroke. The patient's score is based upon: CHF History: 0 HTN History: 1 Diabetes History: 0 Stroke History: 0 Vascular Disease History: 1 Age Score: 2 Gender Score: 1    CrCl 72 mL/min Platelet count 159  Patient has not had an Afib/aflutter ablation within the last 3 months or DCCV within the last 30 days   Per office protocol, patient can hold Eliquis  for 3 days prior to procedure.   Patient will not need bridging with Lovenox  (enoxaparin ) around procedure.  **This guidance is not considered finalized until pre-operative APP has relayed final recommendations.**

## 2024-03-26 ENCOUNTER — Ambulatory Visit

## 2024-03-26 VITALS — BP 104/78 | HR 67 | Ht 65.0 in | Wt 244.6 lb

## 2024-03-26 DIAGNOSIS — E785 Hyperlipidemia, unspecified: Secondary | ICD-10-CM | POA: Diagnosis not present

## 2024-03-26 DIAGNOSIS — Z0000 Encounter for general adult medical examination without abnormal findings: Secondary | ICD-10-CM

## 2024-03-26 DIAGNOSIS — Z713 Dietary counseling and surveillance: Secondary | ICD-10-CM | POA: Diagnosis not present

## 2024-03-26 NOTE — Patient Instructions (Signed)
 Amanda Wilkins , Thank you for taking time out of your busy schedule to complete your Annual Wellness Visit with me. I enjoyed our conversation and look forward to speaking with you again next year. I, as well as your care team,  appreciate your ongoing commitment to your health goals. Please review the following plan we discussed and let me know if I can assist you in the future. Your Game plan/ To Do List    Referrals: If you haven't heard from the office you've been referred to, please reach out to them at the phone provided.  Referral to Nutritionist to discuss weight loss and diet management Follow up Visits: Next Medicare AWV with our clinical staff: 03/31/2025   Have you seen your provider in the last 6 months (3 months if uncontrolled diabetes)? No Next Office Visit with your provider: to be scheduled by patient  Clinician Recommendations:  Aim for 30 minutes of exercise or brisk walking, 6-8 glasses of water , and 5 servings of fruits and vegetables each day. Educated and advised on getting the Tdap (Tetenus) and Shingles vaccines in 2025.      This is a list of the screening recommended for you and due dates:  Health Maintenance  Topic Date Due   Zoster (Shingles) Vaccine (1 of 2) [birth year]06[birth year]1965   DTaP/Tdap/Td vaccine (2 - Td or Tdap) 01/07/2023   COVID-19 Vaccine (7 - 2024-25 season) 06/16/2023   Flu Shot  05/15/2024   Medicare Annual Wellness Visit  03/26/2025   Pneumococcal Vaccine for age over 27  Completed   DEXA scan (bone density measurement)  Completed   Hepatitis C Screening  Completed   HPV Vaccine  Aged Out   Meningitis B Vaccine  Aged Out   Colon Cancer Screening  Discontinued    Advanced directives: (Copy Requested) Please bring a copy of your health care power of attorney and living will to the office to be added to your chart at your convenience. You can mail to [facility] [address] [place], [place] [address] or email to  [email or web address] Advance Care Planning is important because it:  [x]  Makes sure you receive the medical care that is consistent with your values, goals, and preferences  [x]  It provides guidance to your family and loved ones and reduces their decisional burden about whether or not they are making the right decisions based on your wishes.  Follow the link provided in your after visit summary or read over the paperwork we have mailed to you to help you started getting your Advance Directives in place. If you need assistance in completing these, please reach out to us  so that we can help you!

## 2024-03-26 NOTE — Progress Notes (Cosign Needed Addendum)
 Subjective:   Amanda Wilkins is a 78 y.o. who presents for a Medicare Wellness preventive visit.  As a reminder, Annual Wellness Visits don't include a physical exam, and some assessments may be limited, especially if this visit is performed virtually. We may recommend an in-person follow-up visit with your provider if needed.  Visit Complete: Virtual I connected with  Amanda Wilkins on 03/26/24 by a video and audio enabled telemedicine application and verified that I am speaking with the correct person using two identifiers.  Patient Location: Home  Provider Location: Office/Clinic  I discussed the limitations of evaluation and management by telemedicine. The patient expressed understanding and agreed to proceed.  Vital Signs: Because this visit was a virtual/telehealth visit, some criteria may be missing or patient reported. Any vitals not documented were not able to be obtained and vitals that have been documented are patient reported.   Persons Participating in Visit: Patient.  AWV Questionnaire: Yes: Patient Medicare AWV questionnaire was completed by the patient on 03/24/2024; I have confirmed that all information answered by patient is correct and no changes since this date.  Cardiac Risk Factors include: advanced age (>1men, >61 women);hypertension;obesity (BMI >30kg/m2)     Objective:    Today's Vitals   03/26/24 1346  BP: 104/78  Pulse: 67  Weight: 244 lb 9.6 oz (110.9 kg)  Height: 5' 5 (1.651 m)   Body mass index is 40.7 kg/m.     03/26/2024    1:46 PM 01/09/2024   11:17 AM 01/02/2024   11:03 AM 11/24/2023    9:17 AM 10/27/2023   12:18 AM 09/22/2023    9:30 AM 02/18/2023   12:16 PM  Advanced Directives  Does Patient Have a Medical Advance Directive? Yes Yes Yes No Yes Yes Yes  Type of Estate agent of [place];Living will Living will;Healthcare Power of Attorney Living will    Living will  Does patient want to make changes to  medical advance directive?   No - Patient declined      Copy of Healthcare Power of Attorney in Chart? No - copy requested No - copy requested       Would patient like information on creating a medical advance directive?    No - Patient declined       Current Medications (verified) Outpatient Encounter Medications as of 03/26/2024  Medication Sig   acetaminophen  (TYLENOL ) 500 MG tablet Take 1,000 mg by mouth every 6 (six) hours as needed for moderate pain (pain score 4-6).   ALPRAZolam  (XANAX ) 0.25 MG tablet Take 1 tablet (0.25 mg total) by mouth 2 (two) times daily as needed for anxiety.   cholecalciferol  (VITAMIN D3) 25 MCG (1000 UT) tablet Take 1,000 Units by mouth daily.   ELIQUIS  5 MG TABS tablet TAKE 1 TABLET(5 MG) BY MOUTH TWICE DAILY   famotidine  (PEPCID ) 20 MG tablet TAKE 1 TABLET(20 MG) BY MOUTH AT BEDTIME   flecainide  (TAMBOCOR ) 50 MG tablet TAKE 1 AND 1/2 TABLETS(75 MG) BY MOUTH TWICE DAILY   furosemide  (LASIX ) 40 MG tablet Take 1 tablet (40 mg total) by mouth daily.   hydroxychloroquine  (PLAQUENIL ) 200 MG tablet Take 400 mg by mouth every morning.   levalbuterol  (XOPENEX  HFA) 45 MCG/ACT inhaler Inhale 2 puffs into the lungs every 6 (six) hours as needed for wheezing.   metoprolol  tartrate (LOPRESSOR ) 25 MG tablet TAKE 1 TABLET BY MOUTH THREE TIMES DAILY CAN TAKE ADDITIONAL TABLET AS NEEDED FOR PALPITATIONS   potassium chloride  (KLOR-CON ) 10 MEQ  tablet Take 2 tablets by mouth daily   rosuvastatin  (CRESTOR ) 5 MG tablet Take 1 tablet (5 mg total) by mouth daily.   sertraline  (ZOLOFT ) 50 MG tablet TAKE 1 TABLET BY MOUTH DAILY   fluticasone  (FLONASE) 50 MCG/ACT nasal spray Place 2 sprays into both nostrils daily. (Patient not taking: Reported on 03/18/2024)   HYDROcodone -acetaminophen  (NORCO/VICODIN) 5-325 MG tablet Take 1 tablet by mouth every 6 (six) hours as needed for severe pain (pain score 7-10).   lidocaine -prilocaine  (EMLA ) cream Apply 1 Application topically as needed. (Patient  not taking: Reported on 03/18/2024)   Probiotic Product (ALIGN PO) Take 1 capsule by mouth daily.   [DISCONTINUED] cephALEXin  (KEFLEX ) 500 MG capsule Take 1 capsule (500 mg total) by mouth 4 (four) times daily. (Patient not taking: Reported on 03/18/2024)   No facility-administered encounter medications on file as of 03/26/2024.    Allergies (verified) Doxycycline , Epinephrine  base, Aspirin , Benadryl  [diphenhydramine ], Cefdinir , Covid-19 ad26 vaccine(janssen), Fish allergy , Hylan g-f 20, Penicillins, Tape, and Wound dressing adhesive   History: Past Medical History:  Diagnosis Date   Acute bronchitis 09/11/2016   11/17 refractory   Acute cystitis without hematuria 05/17/2015   Acute kidney injury [facility])    Labs today   Acute right ankle pain 09/09/2018   Anticoagulant long-term use    Xarelto    Axillary pain    Bronchitis    Cellulitis    Cholecystitis    Chronic interstitial nephritis    CONJUNCTIVITIS, ACUTE 10/18/2010   Qualifier: Diagnosis of  By: Plotnikov MD, Oakley Bellman    Coronary artery disease    Mild   D-dimer, elevated    Depression    Dysrhythmia    A. Fib/A. Flutter, AVT   Eye inflammation    GERD (gastroesophageal reflux disease)    History of interstitial nephritis 2016   chronic   History of kidney stones    has a small one found on xray   History of nuclear stress test 12/16/2014   Intermediate risk nuclear study w/ medium size moderate severity reversible defect in the basal and mid inferolateral and inferior wall (per dr cardiology note , dr Ola Berger did not think this was consistent with ischemia)/  normal LV function and wall motion, ef 75%   History of septic shock 01/22/2015   in setting Group A Strep Cellulits erysipelas/chest wall induration with streptoccocus basterium-- Severe sepsis, DIC, Acute respiratory failure with pulmonary edema, Acute Kidney failure with chronic interstitial nephritis   Hypertension    Hyponatremia    Migraines    on Zoloft   for migraines   Mild carotid artery disease (HCC)    per duplex 08-30-2017 bilateral ICA 1-39%   OA (osteoarthritis) rheumotologist-  dr Stefan Edge   both knees,  shoulders, ankles   OSA on CPAP     moderate obstructive sleep apnea with an AHI of 23.4/h and oxygen  desaturations as low as 84%.  Now on CPAP at 12 cm H2O.   PAF (paroxysmal atrial fibrillation) [facility]) 2009   cardiologist-- dr Ola Berger   Paroxysmal atrial flutter [facility])    a. dx 11/2017.   PSVT (paroxysmal supraventricular tachycardia) (HCC)    RA (rheumatoid arthritis) (HCC)    Wears contact lenses    Past Surgical History:  Procedure Laterality Date   BREAST BIOPSY Right 05/15/2017   PASH   CARDIOVERSION N/A 08/28/2021   Procedure: CARDIOVERSION;  Surgeon: Elmyra Haggard, MD;  Location: [facility] ENDOSCOPY;  Service: Cardiovascular;  Laterality: N/A;  CESAREAN SECTION  1980   CHOLECYSTECTOMY N/A 11/16/2021   Procedure: LAPAROSCOPIC CHOLECYSTECTOMY;  Surgeon: Shela Derby, MD;  Location: [facility] OR;  Service: General;  Laterality: N/A;   COLONOSCOPY     ESOPHAGOGASTRODUODENOSCOPY N/A 02/08/2015   Procedure: ESOPHAGOGASTRODUODENOSCOPY (EGD);  Surgeon: Claudette Cue, MD;  Location: [facility] ENDOSCOPY;  Service: Endoscopy;  Laterality: N/A;   KNEE ARTHROSCOPY W/ MENISCAL REPAIR Left 08/2014    @[facility]    SHOULDER SURGERY Right 04/04/2016   TOTAL HIP ARTHROPLASTY Right 06/19/2022   Procedure: RIGHT TOTAL HIP ARTHROPLASTY ANTERIOR APPROACH;  Surgeon: Arnie Lao, MD;  Location: [facility] OR;  Service: Orthopedics;  Laterality: Right;   TOTAL KNEE ARTHROPLASTY Right 09/01/2018   Procedure: RIGHT TOTAL KNEE ARTHROPLASTY;  Surgeon: Christie Cox, MD;  Location: [facility] ORS;  Service: Orthopedics;  Laterality: Right;   Family History  Problem Relation Age of Onset   Pancreatic cancer Brother    Lymphoma Mother    Prostate cancer Father        metastatic prostate cancer   Breast cancer Sister 24   Allergic rhinitis Sister    Urticaria  Sister    Asthma Neg Hx    Eczema Neg Hx    Immunodeficiency Neg Hx    Atopy Neg Hx    Angioedema Neg Hx    Social History   Socioeconomic History   Marital status: Married    Spouse name: Not on file   Number of children: 2   Years of education: Not on file   Highest education level: 12th grade  Occupational History   Occupation: retired    Associate Professor: UNEMPLOYED   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)  Tobacco Use   Smoking status: Never   Smokeless tobacco: Never  Vaping Use   Vaping status: Never Used  Substance and Sexual Activity   Alcohol use: Yes    Alcohol/week: 5.0 standard drinks of alcohol    Types: 5 Glasses of wine per week   Drug use: Never   Sexual activity: Yes    Partners: Male    Birth control/protection: Post-menopausal    Comment: intercourse age 52, sexual partners more than 5  Other Topics Concern   Not on file  Social History Narrative   Not on file   Social Drivers of Health   Financial Resource Strain: Low Risk  (03/26/2024)   Overall Financial Resource Strain (CARDIA)    Difficulty of Paying Living Expenses: Not hard at all  Food Insecurity: No Food Insecurity (03/26/2024)   Hunger Vital Sign    Worried About Running Out of Food in the Last Year: Never true    Ran Out of Food in the Last Year: Never true  Transportation Needs: No Transportation Needs (03/26/2024)   PRAPARE - Administrator, Civil Service (Medical): No    Lack of Transportation (Non-Medical): No  Physical Activity: Insufficiently Active (10/27/2023)   Exercise Vital Sign    Days of Exercise per Week: 3 days    Minutes of Exercise per Session: 40 min  Stress: No Stress Concern Present (03/26/2024)   [employer] of Occupational Health - Occupational Stress Questionnaire    Feeling of Stress: Not at all  Social Connections: Moderately Isolated (03/26/2024)   Social Connection and Isolation Panel    Frequency of Communication with Friends and Family: More  than three times a week    Frequency of Social Gatherings with Friends and Family: Once a week    Attends Religious Services: Never  Active Member of Clubs or Organizations: No    Attends Banker Meetings: Never    Marital Status: Married    Tobacco Counseling Counseling given: No    Clinical Intake:  Pre-visit preparation completed: Yes  Pain : No/denies pain     BMI - recorded: 40.7 Nutritional Risks: None Diabetes: No  Lab Results  Component Value Date   HGBA1C 5.7 10/30/2023   HGBA1C 5.6 02/26/2023   HGBA1C 5.8 (H) 01/30/2015     How often do you need to have someone help you when you read instructions, pamphlets, or other written materials from your doctor or pharmacy?: 1 - Never  Interpreter Needed?: No  Information entered by :: Kandy Orris, CMA   Activities of Daily Living     03/26/2024    1:53 PM 03/24/2024    2:56 PM  In your present state of health, do you have any difficulty performing the following activities:  Hearing? 0 0  Vision? 0 0  Difficulty concentrating or making decisions? 0 0  Walking or climbing stairs? 1 1  Dressing or bathing? 0 0  Doing errands, shopping? 0 0  Preparing Food and eating ? N N  Using the Toilet? N N  In the past six months, have you accidently leaked urine? N N  Do you have problems with loss of bowel control? N N  Managing your Medications? N N  Managing your Finances? N N  Housekeeping or managing your Housekeeping? N N    Patient Care Team: Plotnikov, Oakley Bellman, MD as PCP - General Elmyra Haggard, MD as PCP - Cardiology (Cardiology) Davia Erps, MD (Inactive) as Consulting Physician (Gynecology) Brunilda Capra, MD as Consulting Physician (Orthopedic Surgery) Jhonny Moss, MD as Consulting Physician (Neurology) Christie Cox, MD as Consulting Physician (Orthopedic Surgery) Celia Coles Angus Kenning, DPM as Consulting Physician (Podiatry) Elder Greening, PhD (Psychology) Guinevere Lefevre, OD as  Referring Physician (Optometry) Evalene Hilda, OD (Ophthalmology) I have updated your Care Teams any recent Medical Services you may have received from other providers in the past year.     Assessment:   This is a routine wellness examination for [facility].  Hearing/Vision screen Hearing Screening - Comments:: Denies hearing difficulties   Vision Screening - Comments:: Wears contact lenses/eyeglasses - appt scheduled with Dr Lamont Pilsner for 2025   Goals Addressed               This Visit's Progress     Patient Stated (pt-stated)        Patient stated she's been wanting to lose weight        Depression Screen     03/26/2024    1:59 PM 05/31/2023    1:31 PM 05/31/2023    1:30 PM 01/21/2023   10:32 AM 12/19/2022    2:28 PM 12/11/2022    2:16 PM 11/28/2022   12:13 PM  PHQ 2/9 Scores  PHQ - 2 Score 0 0 0 0 0 0 0  PHQ- 9 Score 1 0  0 0 0 2    Fall Risk     03/26/2024    1:59 PM 03/24/2024    2:56 PM 05/31/2023    1:30 PM 02/14/2023    4:32 PM 12/19/2022    2:28 PM  Fall Risk   Falls in the past year? 0 0 0 0 0  Number falls in past yr: 0  0 0 0  Injury with Fall? 0  0 0 0  Risk for  fall due to : No Fall Risks  No Fall Risks No Fall Risks No Fall Risks  Follow up Falls evaluation completed;Falls prevention discussed  Falls evaluation completed Falls evaluation completed Falls evaluation completed    MEDICARE RISK AT HOME:  Medicare Risk at Home Any stairs in or around the home?: Yes If so, are there any without handrails?: No Home free of loose throw rugs in walkways, pet beds, electrical cords, etc?: Yes Adequate lighting in your home to reduce risk of falls?: Yes Life alert?: No Use of a cane, walker or w/c?: No Grab bars in the bathroom?: No Shower chair or bench in shower?: No Elevated toilet seat or a handicapped toilet?: Yes  TIMED UP AND GO:  Was the test performed?  No  Cognitive Function: 6CIT completed        03/26/2024    2:03 PM 07/13/2022   11:18 AM  05/05/2020   11:50 AM  6CIT Screen  What Year? 0 points 0 points 0 points  What month? 0 points 0 points 0 points  What time? 0 points 0 points 0 points  Count back from 20 0 points 0 points 0 points  Months in reverse 0 points 0 points 0 points  Repeat phrase 0 points 0 points 0 points  Total Score 0 points 0 points 0 points    Immunizations Immunization History  Administered Date(s) Administered   Fluad Quad(high Dose 65+) 08/03/2019, 07/20/2020, 08/22/2021, 07/18/2022   Fluad Trivalent(High Dose 65+) 07/31/2023   Influenza Split 09/24/2011, 09/24/2012   Influenza Whole 08/09/2010   Influenza, High Dose Seasonal PF 08/16/2016, 07/26/2017, 07/16/2018, 08/03/2019, 07/20/2020, 08/22/2021   Influenza,inj,Quad PF,6+ Mos 07/14/2013, 07/27/2014, 06/14/2015   PFIZER Comirnaty(Gray Top)Covid-19 Tri-Sucrose Vaccine 07/09/2020, 03/09/2021   PFIZER(Purple Top)SARS-COV-2 Vaccination 11/05/2019, 11/26/2019, 07/09/2020, 03/09/2021   PNEUMOCOCCAL CONJUGATE-20 06/28/2021   Pneumococcal Conjugate-13 05/25/2014   Pneumococcal Polysaccharide-23 06/21/2015   Tdap 01/06/2013   Zoster, Live 01/24/2011    Screening Tests Health Maintenance  Topic Date Due   Zoster Vaccines- Shingrix (1 of 2) [birth year]06/1964   DTaP/Tdap/Td (2 - Td or Tdap) 01/07/2023   COVID-19 Vaccine (7 - 2024-25 season) 06/16/2023   INFLUENZA VACCINE  05/15/2024   Medicare Annual Wellness (AWV)  03/26/2025   Pneumococcal Vaccine: 50+ Years  Completed   DEXA SCAN  Completed   Hepatitis C Screening  Completed   HPV VACCINES  Aged Out   Meningococcal B Vaccine  Aged Out   Colonoscopy  Discontinued    Health Maintenance  Health Maintenance Due  Topic Date Due   Zoster Vaccines- Shingrix (1 of 2) [birth year]06/1964   DTaP/Tdap/Td (2 - Td or Tdap) 01/07/2023   COVID-19 Vaccine (7 - 2024-25 season) 06/16/2023   Health Maintenance Items Addressed:  Referral to Nutritionist - weight/diet management   Additional Screening:  Vision  Screening: Recommended annual ophthalmology exams for early detection of glaucoma and other disorders of the eye. Would you like a referral to an eye doctor? No  Pt states will schedule an appt w/Angela Martinek in 2025.  Dental Screening: Recommended annual dental exams for proper oral hygiene  Community Resource Referral / Chronic Care Management: CRR required this visit?  No   CCM required this visit?  No   Plan:    I have personally reviewed and noted the following in the patient's chart:   Medical and social history Use of alcohol, tobacco or illicit drugs  Current medications and supplements including opioid prescriptions. Patient is currently taking opioid prescriptions. Information  provided to patient regarding non-opioid alternatives. Patient advised to discuss non-opioid treatment plan with their provider. Functional ability and status Nutritional status Physical activity Advanced directives List of other physicians Hospitalizations, surgeries, and ER visits in previous 12 months Vitals Screenings to include cognitive, depression, and falls Referrals and appointments  In addition, I have reviewed and discussed with patient certain preventive protocols, quality metrics, and best practice recommendations. A written personalized care plan for preventive services as well as general preventive health recommendations were provided to patient.   Patria Bookbinder, CMA   03/26/2024   After Visit Summary: (MyChart) Due to this being a telephonic visit, the after visit summary with patients personalized plan was offered to patient via MyChart   Notes: Nothing significant to report at this time.   Medical screening examination/treatment/procedure(s) were performed by non-physician practitioner and as supervising physician I was immediately available for consultation/collaboration.  I agree with above. Adelaide Holy, MD

## 2024-04-06 ENCOUNTER — Telehealth: Payer: Self-pay | Admitting: Internal Medicine

## 2024-04-06 ENCOUNTER — Encounter: Payer: Self-pay | Admitting: Cardiology

## 2024-04-06 ENCOUNTER — Ambulatory Visit: Attending: Cardiology | Admitting: Cardiology

## 2024-04-06 VITALS — BP 132/60 | HR 60 | Ht 65.0 in | Wt 249.8 lb

## 2024-04-06 DIAGNOSIS — I10 Essential (primary) hypertension: Secondary | ICD-10-CM | POA: Diagnosis not present

## 2024-04-06 DIAGNOSIS — G4733 Obstructive sleep apnea (adult) (pediatric): Secondary | ICD-10-CM | POA: Diagnosis not present

## 2024-04-06 DIAGNOSIS — R635 Abnormal weight gain: Secondary | ICD-10-CM

## 2024-04-06 NOTE — Progress Notes (Signed)
 Evaluation Performed:  Follow-up visit  Date:  04/06/2024   ID:  Amanda Wilkins, DOB 02/25/1946, MRN [id number]  PCP:  Garald Karlynn [name], MD  Cardiologist:  Vina Gull, MD  Sleep Medicine:  Wilbert Bihari, MD Electrophysiologist:  None   Chief Complaint:  OSA  History of Present Illness:    Amanda Wilkins is a 78 y.o. female  with a hx of paroxysmal atrial fibrillation and flutter, hypertension and sleep apnea. Due to ongoing hypertension, snoring atrial fibrillation sleep study was ordered showing moderate obstructive sleep apnea with an AHI of 23.4/h and oxygen  desaturations as low as 84%.  She subsequently underwent CPAP titration to 12 cm H2O.    She called in in June 2024 requesting a new PAP device and I ordered a ResMed CPAP at 12cm H2O.  She is doing well with her PAP device and thinks that she has gotten used to it.  She tolerates the nasal mask and feels the pressure is adequate.  Since going on PAP she feels rested in the am and has no significant daytime sleepiness. Recently she has felt a little more tired when she gets up but attributes it to going to bed latera and getting up earlier than she has in the past.  She denies any significant mouth or nasal dryness or nasal congestion.  She does not think that she snores.     Prior CV studies:   The following studies were reviewed today:  PAP compliance download  Past Medical History:  Diagnosis Date   Acute bronchitis 09/11/2016   11/17 refractory   Acute cystitis without hematuria 05/17/2015   Acute kidney injury [facility])    Labs today   Acute right ankle pain 09/09/2018   Anticoagulant long-term use    Xarelto    Axillary pain    Bronchitis    Cellulitis    Cholecystitis    Chronic interstitial nephritis    CONJUNCTIVITIS, ACUTE 10/18/2010   Qualifier: Diagnosis of  By: Plotnikov MD, Karlynn [name]    Coronary artery disease    Mild   D-dimer, elevated    Depression    Dysrhythmia    A. Fib/A. Flutter, AVT    Eye inflammation    GERD (gastroesophageal reflux disease)    History of interstitial nephritis 2016   chronic   History of kidney stones    has a small one found on xray   History of nuclear stress test 12/16/2014   Intermediate risk nuclear study w/ medium size moderate severity reversible defect in the basal and mid inferolateral and inferior wall (per dr cardiology note , dr vina gull did not think this was consistent with ischemia)/  normal LV function and wall motion, ef 75%   History of septic shock 01/22/2015   in setting Group A Strep Cellulits erysipelas/chest wall induration with streptoccocus basterium-- Severe sepsis, DIC, Acute respiratory failure with pulmonary edema, Acute Kidney failure with chronic interstitial nephritis   Hypertension    Hyponatremia    Migraines    on Zoloft  for migraines   Mild carotid artery disease (HCC)    per duplex 08-30-2017 bilateral ICA 1-39%   OA (osteoarthritis) rheumotologist-  dr jon jacob   both knees,  shoulders, ankles   OSA on CPAP     moderate obstructive sleep apnea with an AHI of 23.4/h and oxygen  desaturations as low as 84%.  Now on CPAP at 12 cm H2O.   PAF (paroxysmal atrial fibrillation) [facility]) 2009   cardiologist-- dr vina gull  Paroxysmal atrial flutter (HCC)    a. dx 11/2017.   PSVT (paroxysmal supraventricular tachycardia) (HCC)    RA (rheumatoid arthritis) (HCC)    Wears contact lenses    Past Surgical History:  Procedure Laterality Date   BREAST BIOPSY Right 05/15/2017   PASH   CARDIOVERSION N/A 08/28/2021   Procedure: CARDIOVERSION;  Surgeon: Okey Vina [name], MD;  Location: [facility] ENDOSCOPY;  Service: Cardiovascular;  Laterality: N/A;   CESAREAN SECTION  1980   CHOLECYSTECTOMY N/A 11/16/2021   Procedure: LAPAROSCOPIC CHOLECYSTECTOMY;  Surgeon: Rubin Calamity, MD;  Location[facility] OR;  Service: General;  Laterality: N/A;   COLONOSCOPY     ESOPHAGOGASTRODUODENOSCOPY N/A 02/08/2015   Procedure:  ESOPHAGOGASTRODUODENOSCOPY (EGD);  Surgeon: Lamar JONETTA Aho, MD;  Location: [facility] ENDOSCOPY;  Service: Endoscopy;  Laterality: N/A;   KNEE ARTHROSCOPY W/ MENISCAL REPAIR Left 08/2014    @[facility]    SHOULDER SURGERY Right 04/04/2016   TOTAL HIP ARTHROPLASTY Right 06/19/2022   Procedure: RIGHT TOTAL HIP ARTHROPLASTY ANTERIOR APPROACH;  Surgeon: Vernetta Lonni GRADE, MD;  Location: [facility] OR;  Service: Orthopedics;  Laterality: Right;   TOTAL KNEE ARTHROPLASTY Right 09/01/2018   Procedure: RIGHT TOTAL KNEE ARTHROPLASTY;  Surgeon: Rubie Kemps, MD;  Location: [facility] ORS;  Service: Orthopedics;  Laterality: Right;     Current Meds  Medication Sig   acetaminophen  (TYLENOL ) 500 MG tablet Take 1,000 mg by mouth every 6 (six) hours as needed for moderate pain (pain score 4-6).   ALPRAZolam  (XANAX ) 0.25 MG tablet Take 1 tablet (0.25 mg total) by mouth 2 (two) times daily as needed for anxiety.   cholecalciferol  (VITAMIN D3) 25 MCG (1000 UT) tablet Take 1,000 Units by mouth daily.   ELIQUIS  5 MG TABS tablet TAKE 1 TABLET(5 MG) BY MOUTH TWICE DAILY   famotidine  (PEPCID ) 20 MG tablet TAKE 1 TABLET(20 MG) BY MOUTH AT BEDTIME   flecainide  (TAMBOCOR ) 50 MG tablet TAKE 1 AND 1/2 TABLETS(75 MG) BY MOUTH TWICE DAILY   furosemide  (LASIX ) 40 MG tablet Take 1 tablet (40 mg total) by mouth daily.   HYDROcodone -acetaminophen  (NORCO/VICODIN) 5-325 MG tablet Take 1 tablet by mouth every 6 (six) hours as needed for severe pain (pain score 7-10).   hydroxychloroquine  (PLAQUENIL ) 200 MG tablet Take 400 mg by mouth every morning.   levalbuterol  (XOPENEX  HFA) 45 MCG/ACT inhaler Inhale 2 puffs into the lungs every 6 (six) hours as needed for wheezing.   metoprolol  tartrate (LOPRESSOR ) 25 MG tablet TAKE 1 TABLET BY MOUTH THREE TIMES DAILY CAN TAKE ADDITIONAL TABLET AS NEEDED FOR PALPITATIONS   potassium chloride  (KLOR-CON ) 10 MEQ tablet Take 2 tablets by mouth daily   Probiotic Product (ALIGN PO) Take 1 capsule by mouth daily.    rosuvastatin  (CRESTOR ) 5 MG tablet Take 1 tablet (5 mg total) by mouth daily.   sertraline  (ZOLOFT ) 50 MG tablet TAKE 1 TABLET BY MOUTH DAILY     Allergies:   Doxycycline , Epinephrine  base, Aspirin , Benadryl  [diphenhydramine ], Cefdinir , Covid-19 ad26 vaccine(janssen), Fish allergy , Hylan g-f 20, Penicillins, Tape, and Wound dressing adhesive   Social History   Tobacco Use   Smoking status: Never   Smokeless tobacco: Never  Vaping Use   Vaping status: Never Used  Substance Use Topics   Alcohol use: Yes    Alcohol/week: 5.0 standard drinks of alcohol    Types: 5 Glasses of wine per week   Drug use: Never     Family Hx: The patient's family history includes Allergic rhinitis in her sister; Breast cancer (age of  onset: 89) in her sister; Lymphoma in her mother; Pancreatic cancer in her brother; Prostate cancer in her father; Urticaria in her sister. There is no history of Asthma, Eczema, Immunodeficiency, Atopy, or Angioedema.  ROS:   Please see the history of present illness.     All other systems reviewed and are negative.   Labs/Other Tests and Data Reviewed:    Recent Labs: 07/18/2023: NT-Pro BNP 577 02/12/2024: ALT 24; BUN 15; Creatinine, Ser 0.82; Hemoglobin 12.3; Platelets 159.0; Potassium 4.5; Sodium 132 03/04/2024: TSH 2.32   Recent Lipid Panel Lab Results  Component Value Date/Time   CHOL 168 03/06/2022 11:43 AM   TRIG 52 03/06/2022 11:43 AM   HDL 98 03/06/2022 11:43 AM   CHOLHDL 1.7 03/06/2022 11:43 AM   CHOLHDL 2 05/28/2018 08:37 AM   LDLCALC 59 03/06/2022 11:43 AM    Wt Readings from Last 3 Encounters:  04/06/24 249 lb 12.8 oz (113.3 kg)  03/26/24 244 lb 9.6 oz (110.9 kg)  02/12/24 250 lb 3.2 oz (113.5 kg)     Objective:   VS:BP 132/51mmHg, Wt 249lbs, HR 60, O2 sats 99% on RA GEN: Well nourished, well developed in no acute distress HEENT: Normal NECK: No JVD; No carotid bruits LYMPHATICS: No lymphadenopathy CARDIAC:RRR, no murmurs, rubs,  gallops RESPIRATORY:  Clear to auscultation without rales, wheezing or rhonchi  ABDOMEN: Soft, non-tender, non-distended MUSCULOSKELETAL:  No edema; No deformity  SKIN: Warm and dry NEUROLOGIC:  Alert and oriented x 3 PSYCHIATRIC:  Normal affect   ASSESSMENT & PLAN:     OSA - The patient is tolerating PAP therapy well without any problems. The PAP download performed by his DME was personally reviewed and interpreted by me today and showed an AHI of 1.9/hr on 12 cm H2O with 100% compliance in using more than 4 hours nightly.  The patient has been using and benefiting from PAP use and will continue to benefit from therapy.   Hypertension  - BP controlled on exam today - Continue Lopressor  25 mg 3 times daily with as needed refills  Medication Adjustments/Labs and Tests Ordered: Current medicines are reviewed at length with the patient today.  Concerns regarding medicines are outlined above.  Tests Ordered: No orders of the defined types were placed in this encounter.   Medication Changes: No orders of the defined types were placed in this encounter.    Disposition:  Follow up in 1 year(s)  Signed, Wilbert Bihari, MD  04/06/2024 1:52 PM    [facility]

## 2024-04-06 NOTE — Patient Instructions (Signed)

## 2024-04-06 NOTE — Telephone Encounter (Signed)
 Copied from CRM [id number]. Topic: General - Other >> Apr 06, 2024 10:40 AM Robinson H wrote: Reason for CRM: Patient is calling to speak with Verdie, states she was supposed to receive a call from a nutritionist per [place] and hasn't heard from anyone.  [place] [phone]

## 2024-04-09 DIAGNOSIS — B349 Viral infection, unspecified: Secondary | ICD-10-CM | POA: Diagnosis not present

## 2024-04-09 DIAGNOSIS — Z20822 Contact with and (suspected) exposure to covid-19: Secondary | ICD-10-CM | POA: Diagnosis not present

## 2024-04-22 ENCOUNTER — Telehealth: Payer: Self-pay | Admitting: Internal Medicine

## 2024-04-22 NOTE — Telephone Encounter (Signed)
 Copied from CRM [id number]. Topic: General - Call Back - No Documentation >> Apr 22, 2024 12:02 PM Leah C wrote: Reason for CRM: Patient is returning call to speak with Catrina. Patients contact is [phone].

## 2024-04-24 ENCOUNTER — Ambulatory Visit (INDEPENDENT_AMBULATORY_CARE_PROVIDER_SITE_OTHER): Admitting: Family Medicine

## 2024-04-24 ENCOUNTER — Ambulatory Visit (INDEPENDENT_AMBULATORY_CARE_PROVIDER_SITE_OTHER)

## 2024-04-24 VITALS — BP 128/72 | HR 64 | Temp 98.4°F | Ht 65.0 in | Wt 247.0 lb

## 2024-04-24 DIAGNOSIS — R052 Subacute cough: Secondary | ICD-10-CM | POA: Diagnosis not present

## 2024-04-24 DIAGNOSIS — M79671 Pain in right foot: Secondary | ICD-10-CM

## 2024-04-24 DIAGNOSIS — M79672 Pain in left foot: Secondary | ICD-10-CM

## 2024-04-24 DIAGNOSIS — Z7901 Long term (current) use of anticoagulants: Secondary | ICD-10-CM | POA: Diagnosis not present

## 2024-04-24 DIAGNOSIS — D6869 Other thrombophilia: Secondary | ICD-10-CM

## 2024-04-24 DIAGNOSIS — I4811 Longstanding persistent atrial fibrillation: Secondary | ICD-10-CM | POA: Diagnosis not present

## 2024-04-24 DIAGNOSIS — R0602 Shortness of breath: Secondary | ICD-10-CM | POA: Diagnosis not present

## 2024-04-24 DIAGNOSIS — R059 Cough, unspecified: Secondary | ICD-10-CM | POA: Diagnosis not present

## 2024-04-24 MED ORDER — BENZONATATE 200 MG PO CAPS: 200.0000 mg | ORAL_CAPSULE | Freq: Two times a day (BID) | ORAL | 1 refills | Status: DC | PRN

## 2024-04-24 MED ORDER — HYDROCODONE BIT-HOMATROP MBR 5-1.5 MG/5ML PO SOLN: 5.0000 mL | Freq: Three times a day (TID) | ORAL | 0 refills | Status: DC | PRN

## 2024-04-24 NOTE — Progress Notes (Unsigned)
 Acute Office Visit  Subjective:     Patient ID: Amanda Wilkins, female    DOB: 1946/09/14, 78 y.o.   MRN: [id number]  No chief complaint on file.   HPI  Discussed the use of AI scribe software for clinical note transcription with the patient, who gave verbal consent to proceed.  History of Present Illness Amanda Wilkins is a 78 year old female who presents with a persistent cough and foot pain.  Cough - Persistent for several weeks - Characterized as feeling like 'a lot of needles' when coughing - Worsens in the morning and at night - Minimal sputum production - No significant fever or systemic symptoms - Chest x-ray performed two weeks ago - Tessalon  Perles provide slight relief - Levalbuterol  inhaler used as needed - Expired cough syrup provides temporary relief  Foot pain - Affects the entire dorsal aspect of both feet - Worse with bending - Impacts mobility  Knee pain and surgical planning - Ongoing knee pain with current knee worse than previously operated knee - Scheduled for knee surgery  Atrial fibrillation - Stable atrial fibrillation - Avoids antihistamines due to concern for triggering atrial fibrillation, as advised by pharmacist     ROS Per HPI      Objective:    There were no vitals taken for this visit.   Physical Exam Vitals and nursing note reviewed.  Constitutional:      General: She is not in acute distress.    Comments: Appears fatigued  HENT:     Head: Normocephalic and atraumatic.     Right Ear: External ear normal.     Left Ear: External ear normal.     Nose: No congestion.     Mouth/Throat:     Mouth: Mucous membranes are moist.     Pharynx: Oropharynx is clear. No oropharyngeal exudate or posterior oropharyngeal erythema.  Eyes:     Extraocular Movements: Extraocular movements intact.  Cardiovascular:     Rate and Rhythm: Normal rate and regular rhythm.     Heart sounds: Normal heart sounds.  Pulmonary:      Effort: Pulmonary effort is normal. No respiratory distress.     Breath sounds: No wheezing, rhonchi or rales.  Musculoskeletal:     Cervical back: Normal range of motion and neck supple.  Lymphadenopathy:     Cervical: No cervical adenopathy.  Skin:    General: Skin is warm and dry.  Neurological:     General: No focal deficit present.     Mental Status: She is alert and oriented to person, place, and time.    No results found for any visits on 04/24/24.      Assessment & Plan:   Assessment and Plan Assessment & Plan Persistent Cough Persistent cough for several weeks, worse in morning and night. Normal chest x-ray. Tessalon  Perles provided some relief. Discussed levalbuterol  inhaler for symptom management. Advised on social activity impact. - Order chest x-ray. - Prescribe levalbuterol  inhaler, 1-2 puffs every 6 hours as needed. - Refill Tessalon  Perles (benzonatate  200 mg). - Avoid antihistamines and decongestants due to potential exacerbation of atrial fibrillation.  Atrial Fibrillation Atrial fibrillation well-managed. Advised against antihistamines and decongestants. - Avoid antihistamines and decongestants that may affect heart rate.  Foot Pain Pain on top of both feet, worse with bending.  Follow-up - Follow up after chest x-ray results to determine next steps.      No orders of the defined types were placed in this encounter.  No follow-ups on file.  Corean LITTIE Ku, FNP

## 2024-04-24 NOTE — Patient Instructions (Signed)
 I have sent in hydrocodone  cough syrup for you to take 5 mL once daily in the evening as needed for cough.  This medication may make you sleepy.  Do not drive or operate heavy machinery while taking this medication.  I have sent in tessalon  perles for you today  We are getting an xray today. We will be in contact with any abnormal results that require further attention.  Follow-up with me for new or worsening symptoms.

## 2024-04-28 ENCOUNTER — Encounter: Payer: Self-pay | Admitting: Family Medicine

## 2024-04-28 DIAGNOSIS — M79671 Pain in right foot: Secondary | ICD-10-CM | POA: Insufficient documentation

## 2024-04-28 DIAGNOSIS — I4811 Longstanding persistent atrial fibrillation: Secondary | ICD-10-CM | POA: Insufficient documentation

## 2024-04-28 DIAGNOSIS — Z7901 Long term (current) use of anticoagulants: Secondary | ICD-10-CM | POA: Insufficient documentation

## 2024-04-30 ENCOUNTER — Ambulatory Visit: Payer: Self-pay

## 2024-04-30 ENCOUNTER — Ambulatory Visit (INDEPENDENT_AMBULATORY_CARE_PROVIDER_SITE_OTHER): Admitting: Pulmonary Disease

## 2024-04-30 VITALS — BP 125/77 | HR 80 | Ht 65.0 in | Wt 252.0 lb

## 2024-04-30 DIAGNOSIS — R053 Chronic cough: Secondary | ICD-10-CM

## 2024-04-30 DIAGNOSIS — R0602 Shortness of breath: Secondary | ICD-10-CM | POA: Diagnosis not present

## 2024-04-30 MED ORDER — AZITHROMYCIN 250 MG PO TABS: ORAL_TABLET | ORAL | 0 refills | Status: DC

## 2024-04-30 MED ORDER — PREDNISONE 10 MG PO TABS: 10.0000 mg | ORAL_TABLET | Freq: Every day | ORAL | 0 refills | Status: DC

## 2024-04-30 NOTE — Telephone Encounter (Signed)
 FYI Only or Action Required?: FYI only for provider.  Patient is followed in Pulmonology for chronic cough, last seen on 01/16/2024 by Meade Verdon RAMAN, MD.  Called Nurse Triage reporting Cough.  Symptoms began about a month ago.  Interventions attempted: Prescription medications: hydrocodone  cough syrup and tessalon  and Rescue inhaler.  Symptoms are: gradually worsening.  Triage Disposition: See HCP Within 4 Hours (Or PCP Triage)  Patient/caregiver understands and will follow disposition?: Yes                             Copied from CRM [id number]. Topic: Clinical - Red Word Triage >> Apr 30, 2024  8:59 AM Celestine FALCON wrote: Red Word that prompted transfer to Nurse Triage: Pt is having a worsening cough for about a month. Pt already seen her pcp no relief. Pt his having SOB and difficulty breathing when walking as well. Pt stated she's had two xrays with nothing showing in lungs. Pt sees Dr. Meade in [place]. Reason for Disposition  [1] MILD difficulty breathing (e.g., minimal/no SOB at rest, SOB with walking, pulse < 100) AND [2] still present when not coughing  Answer Assessment - Initial Assessment Questions E2C2 Pulmonary Triage - Initial Assessment Questions  1. Chief Complaint (e.g., cough, sob, wheezing, fever, chills, sweat or additional symptoms) *Go to specific symptom protocol after initial questions. Ongoing cough   2. How long have symptoms been present? At least a month, worsening  3. Have you used any OTC meds to help with symptoms? If yes, ask What medications? Tylenol    4. Have you used your inhalers/maintenance medication? If yes, What medications? Levalbuterol    5. If inhaler, ask How many puffs and how often? Note: Review instructions on medication in the chart. States she has not used it too much   6. Do you wear supplemental oxygen ? If yes, How many liters are you supposed to use? Denies   7. Do you monitor  your oxygen  levels? If yes, What is your reading (oxygen  level) today? 97-98%     2. SEVERITY: How bad is the cough today?      Patient having frequent coughing spells while on phone with this RN 3. SPUTUM: Describe the color of your sputum (e.g., none, dry cough; clear, white, yellow, green)     Yellow sometimes, but most of the time it's clear  5. DIFFICULTY BREATHING: Are you having difficulty breathing? If Yes, ask: How bad is it? (e.g., mild, moderate, severe)      SOB upon exertion, denies SOB at rest when not coughing 6. FEVER: Do you have a fever? If Yes, ask: What is your temperature, how was it measured, and when did it start?     Denies 7. CARDIAC HISTORY: Do you have any history of heart disease? (e.g., heart attack, congestive heart failure)      Afib 8. LUNG HISTORY: Do you have any history of lung disease?  (e.g., pulmonary embolus, asthma, emphysema)     Denies 10. OTHER SYMPTOMS: Do you have any other symptoms? (e.g., runny nose, wheezing, chest pain)     Occasional wheezing, denies chest pain  Protocols used: Cough - Acute Productive-A-AH

## 2024-04-30 NOTE — Progress Notes (Signed)
 Amanda Wilkins    [id number]    Mar 06, 1946  Primary Care Physician:Plotnikov, Karlynn [name], MD  Referring Physician: Garald Karlynn [name], MD [address] [place],  [place] [address]  Chief complaint:   Patient being seen for persistent cough  HPI:  Has had a cough that lasted a few weeks, was recently seen in a primary doctor's office,  Prescribed cough medicines which unfortunately have not really helped much  She continues to cough, minimal sputum production No chest pains or chest discomfort No wheezing  Prior to the visit the cough has been ongoing for months  No predilection for any particular time of the day  Having some shortness of breath  She was seen by Dr. Meade in April, previously had used Tessalon  Perles, Levaquin , cough syrup with codeine   No history of childhood respiratory illnesses  Never smoker, no significant exposure to secondhand smoke  PFT within normal limits previously-October 2020  Outpatient Encounter Medications as of 04/30/2024  Medication Sig   acetaminophen  (TYLENOL ) 500 MG tablet Take 1,000 mg by mouth every 6 (six) hours as needed for moderate pain (pain score 4-6).   ALPRAZolam  (XANAX ) 0.25 MG tablet Take 1 tablet (0.25 mg total) by mouth 2 (two) times daily as needed for anxiety.   azithromycin  (ZITHROMAX  Z-PAK) 250 MG tablet Take 2 tablets day 1 and then 1 daily for 4 days   benzonatate  (TESSALON ) 200 MG capsule Take 1 capsule (200 mg total) by mouth 2 (two) times daily as needed for cough.   cholecalciferol  (VITAMIN D3) 25 MCG (1000 UT) tablet Take 1,000 Units by mouth daily.   ELIQUIS  5 MG TABS tablet TAKE 1 TABLET(5 MG) BY MOUTH TWICE DAILY   famotidine  (PEPCID ) 20 MG tablet TAKE 1 TABLET(20 MG) BY MOUTH AT BEDTIME   flecainide  (TAMBOCOR ) 50 MG tablet TAKE 1 AND 1/2 TABLETS(75 MG) BY MOUTH TWICE DAILY   furosemide  (LASIX ) 40 MG tablet Take 1 tablet (40 mg total) by mouth daily.   HYDROcodone  bit-homatropine  (HYCODAN) 5-1.5 MG/5ML syrup Take 5 mLs by mouth every 8 (eight) hours as needed for cough.   HYDROcodone -acetaminophen  (NORCO/VICODIN) 5-325 MG tablet Take 1 tablet by mouth every 6 (six) hours as needed for severe pain (pain score 7-10).   hydroxychloroquine  (PLAQUENIL ) 200 MG tablet Take 400 mg by mouth every morning.   metoprolol  tartrate (LOPRESSOR ) 25 MG tablet TAKE 1 TABLET BY MOUTH THREE TIMES DAILY CAN TAKE ADDITIONAL TABLET AS NEEDED FOR PALPITATIONS   potassium chloride  (KLOR-CON ) 10 MEQ tablet Take 2 tablets by mouth daily   predniSONE  (DELTASONE ) 10 MG tablet Take 1 tablet (10 mg total) by mouth daily with breakfast.   Probiotic Product (ALIGN PO) Take 1 capsule by mouth daily.   rosuvastatin  (CRESTOR ) 5 MG tablet Take 1 tablet (5 mg total) by mouth daily.   sertraline  (ZOLOFT ) 50 MG tablet TAKE 1 TABLET BY MOUTH DAILY   fluticasone  (FLONASE) 50 MCG/ACT nasal spray Place 2 sprays into both nostrils daily. (Patient not taking: Reported on 04/06/2024)   levalbuterol  (XOPENEX  HFA) 45 MCG/ACT inhaler Inhale 2 puffs into the lungs every 6 (six) hours as needed for wheezing.   lidocaine -prilocaine  (EMLA ) cream Apply 1 Application topically as needed. (Patient not taking: Reported on 04/06/2024)   No facility-administered encounter medications on file as of 04/30/2024.    Allergies as of 04/30/2024 - Review Complete 04/30/2024  Allergen Reaction Noted   Doxycycline  Hives 11/01/2023   Epinephrine  base Other (See  Comments) 07/12/2014   Aspirin  Other (See Comments) 06/13/2011   Benadryl  [diphenhydramine ]  08/03/2020   Cefdinir  Diarrhea 10/30/2023   Covid-19 ad26 vaccine(janssen) Hives 03/20/2021   Fish allergy  Hives 11/11/2020   Hylan g-f 20 Other (See Comments) 06/18/2018   Penicillins Other (See Comments)    Tape Other (See Comments) 02/03/2015   Wound dressing adhesive Other (See Comments) 02/03/2015    Past Medical History:  Diagnosis Date   Acute bronchitis 09/11/2016   11/17  refractory   Acute cystitis without hematuria 05/17/2015   Acute kidney injury [facility])    Labs today   Acute right ankle pain 09/09/2018   Anticoagulant long-term use    Xarelto    Axillary pain    Bronchitis    Cellulitis    Cholecystitis    Chronic interstitial nephritis    CONJUNCTIVITIS, ACUTE 10/18/2010   Qualifier: Diagnosis of  By: Plotnikov MD, Karlynn [name]    Coronary artery disease    Mild   D-dimer, elevated    Depression    Dysrhythmia    A. Fib/A. Flutter, AVT   Eye inflammation    GERD (gastroesophageal reflux disease)    History of interstitial nephritis 2016   chronic   History of kidney stones    has a small one found on xray   History of nuclear stress test 12/16/2014   Intermediate risk nuclear study w/ medium size moderate severity reversible defect in the basal and mid inferolateral and inferior wall (per dr cardiology note , dr vina gull did not think this was consistent with ischemia)/  normal LV function and wall motion, ef 75%   History of septic shock 01/22/2015   in setting Group A Strep Cellulits erysipelas/chest wall induration with streptoccocus basterium-- Severe sepsis, DIC, Acute respiratory failure with pulmonary edema, Acute Kidney failure with chronic interstitial nephritis   Hypertension    Hyponatremia    Migraines    on Zoloft  for migraines   Mild carotid artery disease (HCC)    per duplex 08-30-2017 bilateral ICA 1-39%   OA (osteoarthritis) rheumotologist-  dr jon jacob   both knees,  shoulders, ankles   OSA on CPAP     moderate obstructive sleep apnea with an AHI of 23.4/h and oxygen  desaturations as low as 84%.  Now on CPAP at 12 cm H2O.   PAF (paroxysmal atrial fibrillation) [facility]) 2009   cardiologist-- dr vina gull   Paroxysmal atrial flutter [facility])    a. dx 11/2017.   PSVT (paroxysmal supraventricular tachycardia) (HCC)    RA (rheumatoid arthritis) (HCC)    Wears contact lenses     Past Surgical History:  Procedure Laterality  Date   BREAST BIOPSY Right 05/15/2017   PASH   CARDIOVERSION N/A 08/28/2021   Procedure: CARDIOVERSION;  Surgeon: gull vina [name], MD;  Location: [facility] ENDOSCOPY;  Service: Cardiovascular;  Laterality: N/A;   CESAREAN SECTION  1980   CHOLECYSTECTOMY N/A 11/16/2021   Procedure: LAPAROSCOPIC CHOLECYSTECTOMY;  Surgeon: Rubin Calamity, MD;  Location: [facility] OR;  Service: General;  Laterality: N/A;   COLONOSCOPY     ESOPHAGOGASTRODUODENOSCOPY N/A 02/08/2015   Procedure: ESOPHAGOGASTRODUODENOSCOPY (EGD);  Surgeon: Lamar JONETTA Aho, MD;  Location: [facility] ENDOSCOPY;  Service: Endoscopy;  Laterality: N/A;   KNEE ARTHROSCOPY W/ MENISCAL REPAIR Left 08/2014    @[facility]    SHOULDER SURGERY Right 04/04/2016   TOTAL HIP ARTHROPLASTY Right 06/19/2022   Procedure: RIGHT TOTAL HIP ARTHROPLASTY ANTERIOR APPROACH;  Surgeon: Vernetta Lonni GRADE, MD;  Location: [facility] OR;  Service:  Orthopedics;  Laterality: Right;   TOTAL KNEE ARTHROPLASTY Right 09/01/2018   Procedure: RIGHT TOTAL KNEE ARTHROPLASTY;  Surgeon: Rubie Kemps, MD;  Location: [facility] ORS;  Service: Orthopedics;  Laterality: Right;    Family History  Problem Relation Age of Onset   Pancreatic cancer Brother    Lymphoma Mother    Prostate cancer Father        metastatic prostate cancer   Breast cancer Sister 39   Allergic rhinitis Sister    Urticaria Sister    Asthma Neg Hx    Eczema Neg Hx    Immunodeficiency Neg Hx    Atopy Neg Hx    Angioedema Neg Hx     Social History   Socioeconomic History   Marital status: Married    Spouse name: Not on file   Number of children: 2   Years of education: Not on file   Highest education level: Associate degree: occupational, Scientist, product/process development, or vocational program  Occupational History   Occupation: retired    Associate Professor: UNEMPLOYED   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)  Tobacco Use   Smoking status: Never   Smokeless tobacco: Never  Vaping Use   Vaping status: Never Used  Substance and Sexual Activity    Alcohol use: Yes    Alcohol/week: 5.0 standard drinks of alcohol    Types: 5 Glasses of wine per week   Drug use: Never   Sexual activity: Yes    Partners: Male    Birth control/protection: Post-menopausal    Comment: intercourse age 55, sexual partners more than 5  Other Topics Concern   Not on file  Social History Narrative   Not on file   Social Drivers of Health   Financial Resource Strain: Low Risk  (04/23/2024)   Overall Financial Resource Strain (CARDIA)    Difficulty of Paying Living Expenses: Not hard at all  Food Insecurity: No Food Insecurity (04/23/2024)   Hunger Vital Sign    Worried About Running Out of Food in the Last Year: Never true    Ran Out of Food in the Last Year: Never true  Transportation Needs: No Transportation Needs (04/23/2024)   PRAPARE - Administrator, Civil Service (Medical): No    Lack of Transportation (Non-Medical): No  Physical Activity: Insufficiently Active (04/23/2024)   Exercise Vital Sign    Days of Exercise per Week: 3 days    Minutes of Exercise per Session: 30 min  Stress: No Stress Concern Present (04/23/2024)   [employer] of Occupational Health - Occupational Stress Questionnaire    Feeling of Stress: Only a little  Social Connections: Moderately Isolated (04/23/2024)   Social Connection and Isolation Panel    Frequency of Communication with Friends and Family: More than three times a week    Frequency of Social Gatherings with Friends and Family: Once a week    Attends Religious Services: Never    Database administrator or Organizations: No    Attends Engineer, structural: Not on file    Marital Status: Married  Catering manager Violence: Not At Risk (03/26/2024)   Humiliation, Afraid, Rape, and Kick questionnaire    Fear of Current or Ex-Partner: No    Emotionally Abused: No    Physically Abused: No    Sexually Abused: No    Review of Systems  Respiratory:  Positive for cough and shortness of  breath.     Vitals:   04/30/24 1107  BP: 125/77  Pulse: 80  SpO2: 96%     Physical Exam Constitutional:      Appearance: She is obese.  HENT:     Head: Normocephalic.     Mouth/Throat:     Mouth: Mucous membranes are moist.  Eyes:     General: No scleral icterus. Cardiovascular:     Rate and Rhythm: Normal rate and regular rhythm.     Heart sounds: No murmur heard.    No friction rub.  Pulmonary:     Effort: No respiratory distress.     Breath sounds: No stridor. Rhonchi present. No wheezing.  Musculoskeletal:     Cervical back: No rigidity or tenderness.  Neurological:     Mental Status: She is alert.    Data Reviewed: Recent chest x-ray 04/24/2024 reviewed showing increased bronchovascular markings  Assessment:  Persistent cough Recent visit to her primary care doctor's office - Cough medicines have not really helped  History of atrial fibrillation - On Eliquis   Possible asthma - Continue inhalers - Hide had a Fino obtained during previous visit that was 17  Plan/Recommendations: Continue current cough medicine  With persistence of symptoms, we will call in a course of azithromycin  and prednisone -low-dose of prednisone  at 10 mg to be used for about 7 days  Xopenex  as needed  Encouraged to call with significant concerns  Jennet Epley MD [facility] 04/30/2024, 11:35 AM  CC: Plotnikov, Karlynn [name], MD

## 2024-04-30 NOTE — Patient Instructions (Signed)
 Follow-up as previously scheduled  Will call in a prescription for azithromycin   Call in a prescription for prednisone   Call us  with significant concerns

## 2024-05-01 DIAGNOSIS — M1712 Unilateral primary osteoarthritis, left knee: Secondary | ICD-10-CM | POA: Diagnosis not present

## 2024-05-07 DIAGNOSIS — H0288A Meibomian gland dysfunction right eye, upper and lower eyelids: Secondary | ICD-10-CM | POA: Diagnosis not present

## 2024-05-07 DIAGNOSIS — H43812 Vitreous degeneration, left eye: Secondary | ICD-10-CM | POA: Diagnosis not present

## 2024-05-07 DIAGNOSIS — H2513 Age-related nuclear cataract, bilateral: Secondary | ICD-10-CM | POA: Diagnosis not present

## 2024-05-07 DIAGNOSIS — H0288B Meibomian gland dysfunction left eye, upper and lower eyelids: Secondary | ICD-10-CM | POA: Diagnosis not present

## 2024-05-09 ENCOUNTER — Other Ambulatory Visit: Payer: Self-pay | Admitting: Internal Medicine

## 2024-05-09 DIAGNOSIS — I4819 Other persistent atrial fibrillation: Secondary | ICD-10-CM

## 2024-05-11 ENCOUNTER — Telehealth: Payer: Self-pay | Admitting: Internal Medicine

## 2024-05-11 NOTE — Telephone Encounter (Unsigned)
 Copied from CRM [id number]. Topic: Clinical - Medication Question >> May 11, 2024  9:57 AM Jasmin G wrote: Reason for CRM: Pt stated that she needs to lose about 10 pounds before upcoming surgery on September 22nd, her course of action atm is try to order Zepbound  medication through a website called [email or web address], she called and was told that she needs a script from PCP, she would like call back to get clarification on how the process would be, please call pt back ASAP.

## 2024-05-11 NOTE — Telephone Encounter (Signed)
 Prescription refill request for Eliquis  received. Indication:afib Last office visit:4/25 Scr:0.82  4/25 Age: 78 Weight:114.3  kg  Prescription refilled

## 2024-05-12 MED ORDER — ZEPBOUND 2.5 MG/0.5ML ~~LOC~~ SOAJ: 2.5000 mg | SUBCUTANEOUS | 3 refills | Status: DC

## 2024-05-12 NOTE — Telephone Encounter (Signed)
 Okay.  Prescription was emailed.  Thanks

## 2024-05-14 NOTE — Telephone Encounter (Signed)
 Copied from CRM #[id number]. Topic: Clinical - Medication Question >> May 14, 2024 10:48 AM Chasity T wrote: Reason for CRM: Patient is calling in regarding medication Zepbound . Stating that [facility] is needing an updated medical form for the medication. She states that they informed Dr Garald but hasn't heard anything back.Please contact patient regarding her concerns.

## 2024-05-19 ENCOUNTER — Encounter (HOSPITAL_COMMUNITY): Payer: Self-pay | Admitting: Emergency Medicine

## 2024-05-19 ENCOUNTER — Emergency Department (HOSPITAL_COMMUNITY)
Admission: EM | Admit: 2024-05-19 | Discharge: 2024-05-19 | Disposition: A | Discharge status: Home / Self Care | Attending: Emergency Medicine | Admitting: Emergency Medicine

## 2024-05-19 DIAGNOSIS — E871 Hypo-osmolality and hyponatremia: Secondary | ICD-10-CM | POA: Diagnosis not present

## 2024-05-19 DIAGNOSIS — R600 Localized edema: Secondary | ICD-10-CM | POA: Insufficient documentation

## 2024-05-19 DIAGNOSIS — Z7901 Long term (current) use of anticoagulants: Secondary | ICD-10-CM | POA: Diagnosis not present

## 2024-05-19 DIAGNOSIS — R7309 Other abnormal glucose: Secondary | ICD-10-CM | POA: Insufficient documentation

## 2024-05-19 DIAGNOSIS — I480 Paroxysmal atrial fibrillation: Secondary | ICD-10-CM | POA: Diagnosis not present

## 2024-05-19 DIAGNOSIS — Z79899 Other long term (current) drug therapy: Secondary | ICD-10-CM | POA: Diagnosis not present

## 2024-05-19 DIAGNOSIS — I10 Essential (primary) hypertension: Secondary | ICD-10-CM | POA: Insufficient documentation

## 2024-05-19 DIAGNOSIS — M7989 Other specified soft tissue disorders: Secondary | ICD-10-CM | POA: Insufficient documentation

## 2024-05-19 DIAGNOSIS — I2510 Atherosclerotic heart disease of native coronary artery without angina pectoris: Secondary | ICD-10-CM | POA: Insufficient documentation

## 2024-05-19 DIAGNOSIS — R739 Hyperglycemia, unspecified: Secondary | ICD-10-CM

## 2024-05-19 LAB — CBC WITH DIFFERENTIAL/PLATELET
Abs Immature Granulocytes: 0.02 K/uL (ref 0.00–0.07)
Basophils Absolute: 0.1 K/uL (ref 0.0–0.1)
Basophils Relative: 1 %
Eosinophils Absolute: 0.1 K/uL (ref 0.0–0.5)
Eosinophils Relative: 1 %
HCT: 39 % (ref 36.0–46.0)
Hemoglobin: 13.3 g/dL (ref 12.0–15.0)
Immature Granulocytes: 0 %
Lymphocytes Relative: 35 %
Lymphs Abs: 2.7 K/uL (ref 0.7–4.0)
MCH: 33.2 pg (ref 26.0–34.0)
MCHC: 34.1 g/dL (ref 30.0–36.0)
MCV: 97.3 fL (ref 80.0–100.0)
Monocytes Absolute: 0.5 K/uL (ref 0.1–1.0)
Monocytes Relative: 6 %
Neutro Abs: 4.4 K/uL (ref 1.7–7.7)
Neutrophils Relative %: 57 %
Platelets: 188 K/uL (ref 150–400)
RBC: 4.01 MIL/uL (ref 3.87–5.11)
RDW: 13.3 % (ref 11.5–15.5)
WBC: 7.7 K/uL (ref 4.0–10.5)
nRBC: 0 % (ref 0.0–0.2)

## 2024-05-19 LAB — BASIC METABOLIC PANEL WITH GFR
Anion gap: 10 (ref 5–15)
BUN: 19 mg/dL (ref 8–23)
CO2: 24 mmol/L (ref 22–32)
Calcium: 9.6 mg/dL (ref 8.9–10.3)
Chloride: 99 mmol/L (ref 98–111)
Creatinine, Ser: 0.88 mg/dL (ref 0.44–1.00)
GFR, Estimated: >60 mL/min (ref >60)
Glucose, Bld: 101 mg/dL — ABNORMAL HIGH (ref 70–99)
Potassium: 4.5 mmol/L (ref 3.5–5.1)
Sodium: 133 mmol/L — ABNORMAL LOW (ref 135–145)

## 2024-05-19 NOTE — ED Triage Notes (Signed)
 No chest pain, no SOB, no neuro deficits, no diaphoresis.  Pt endorses bilateral swelling in legs- salty meal tonight at dinner. She took a lasix  and potassium. Reports she had not checked her BP in awhile and noticed it was 150s/100s at home. She took some an extra metoprolol  and 2.5mg  amlodipine  at home and continued to check it hoping it would come down.  Mentioned a floater in her eye around 1130 that lasted for an hour but has since resolved.

## 2024-05-19 NOTE — Discharge Instructions (Addendum)
 Please check your blood pressure once a day and keep a record of it.  Make sure to take that record with you whenever you see your cardiologist.  Please take your furosemide  once a day for the next 3 days.  As long as you are not having associated symptoms, elevated blood pressure should be managed as an outpatient in conjunction with your cardiologist.  However, if it anytime you have other symptoms such as chest pain or shortness of breath or headache or visual changes, then you should be seen in the emergency department.

## 2024-05-19 NOTE — ED Provider Notes (Signed)
 [place] EMERGENCY DEPARTMENT AT [facility] Provider Note   CSN: [id number] Arrival date & time: 05/19/24  0208     Patient presents with: Hypertension   Amanda Wilkins is a 78 y.o. female.   The history is provided by the patient.  Hypertension   She has history of hypertension, coronary artery disease, atrial fibrillation/atrial flutter anticoagulated on apixaban  and comes in because of elevated blood pressure at home.  She states she had not checked her blood pressure in several weeks, but felt bad tonight and checked it and her blood pressure was approximately 155/105.  She took an extra dose of metoprolol  and amlodipine  and continue to monitor her blood pressure, but it did not come down.  She denies headache, tinnitus, epistaxis, chest pain, dyspnea.  She has had some leg swelling and had taken a dose of furosemide  this morning.    Prior to Admission medications   Medication Sig Start Date End Date Taking? Authorizing Provider  acetaminophen  (TYLENOL ) 500 MG tablet Take 1,000 mg by mouth every 6 (six) hours as needed for moderate pain (pain score 4-6).    [provider]  ALPRAZolam  (XANAX ) 0.25 MG tablet Take 1 tablet (0.25 mg total) by mouth 2 (two) times daily as needed for anxiety. 10/30/23   Plotnikov, Aleksei V, MD  azithromycin  (ZITHROMAX  Z-PAK) 250 MG tablet Take 2 tablets day 1 and then 1 daily for 4 days 04/30/24   Neda Jennet LABOR, MD  benzonatate  (TESSALON ) 200 MG capsule Take 1 capsule (200 mg total) by mouth 2 (two) times daily as needed for cough. 04/24/24   Alvia Corean CROME, FNP  cholecalciferol  (VITAMIN D3) 25 MCG (1000 UT) tablet Take 1,000 Units by mouth daily.    [provider]  ELIQUIS  5 MG TABS tablet TAKE 1 TABLET(5 MG) BY MOUTH TWICE DAILY 05/11/24   Okey Vina [name], MD  famotidine  (PEPCID ) 20 MG tablet TAKE 1 TABLET(20 MG) BY MOUTH AT BEDTIME 01/20/24   Plotnikov, Aleksei V, MD  flecainide  (TAMBOCOR ) 50 MG tablet TAKE 1  AND 1/2 TABLETS(75 MG) BY MOUTH TWICE DAILY 10/29/23   Okey Vina [name], MD  furosemide  (LASIX ) 40 MG tablet Take 1 tablet (40 mg total) by mouth daily. 07/19/23 04/30/24  Nahser, Aleene PARAS, MD  HYDROcodone  bit-homatropine (HYCODAN) 5-1.5 MG/5ML syrup Take 5 mLs by mouth every 8 (eight) hours as needed for cough. 04/24/24   Alvia Corean CROME, FNP  HYDROcodone -acetaminophen  (NORCO/VICODIN) 5-325 MG tablet Take 1 tablet by mouth every 6 (six) hours as needed for severe pain (pain score 7-10). 02/12/24   Plotnikov, Aleksei V, MD  hydroxychloroquine  (PLAQUENIL ) 200 MG tablet Take 400 mg by mouth every morning.    [provider]  levalbuterol  (XOPENEX  HFA) 45 MCG/ACT inhaler Inhale 2 puffs into the lungs every 6 (six) hours as needed for wheezing. 01/16/24   Desai, Nikita S, MD  metoprolol  tartrate (LOPRESSOR ) 25 MG tablet TAKE 1 TABLET BY MOUTH THREE TIMES DAILY CAN TAKE ADDITIONAL TABLET AS NEEDED FOR PALPITATIONS 01/10/24   Okey Vina [name], MD  potassium chloride  (KLOR-CON ) 10 MEQ tablet Take 2 tablets by mouth daily 07/19/23   Nahser, Aleene PARAS, MD  predniSONE  (DELTASONE ) 10 MG tablet Take 1 tablet (10 mg total) by mouth daily with breakfast. 04/30/24   Neda Jennet LABOR, MD  Probiotic Product (ALIGN PO) Take 1 capsule by mouth daily.    [provider]  rosuvastatin  (CRESTOR ) 5 MG tablet Take 1 tablet (5 mg total) by mouth daily.  10/30/23   Plotnikov, Aleksei V, MD  sertraline  (ZOLOFT ) 50 MG tablet TAKE 1 TABLET BY MOUTH DAILY 02/04/24   Plotnikov, Aleksei V, MD  tirzepatide  (ZEPBOUND ) 2.5 MG/0.5ML Pen Inject 2.5 mg into the skin once a week. 05/12/24   Plotnikov, Karlynn [name], MD    Allergies: Doxycycline , Epinephrine  base, Aspirin , Benadryl  [diphenhydramine ], Cefdinir , Covid-19 ad26 vaccine(janssen), Fish allergy , Hylan g-f 20, Penicillins, Tape, and Wound dressing adhesive    Review of Systems  All other systems reviewed and are negative.   Updated Vital Signs BP (!) 177/94 (BP Location:  Right Arm)   Pulse 69   Temp 97.8 F (36.6 C) (Oral)   Resp 15   SpO2 99%   Physical Exam Vitals and nursing note reviewed.   78 year old female, resting comfortably and in no acute distress. Vital signs are significant for elevated blood pressure. Oxygen  saturation is 100%, which is normal. Head is normocephalic and atraumatic. PERRLA, EOMI. Oropharynx is clear. Neck is nontender and supple without adenopathy or JVD. Back is nontender.  There is no presacral edema. Lungs are clear without rales, wheezes, or rhonchi. Heart has regular rate and rhythm without murmur. Abdomen is soft, flat, nontender. Extremities have 1+ edema. Skin is warm and dry without rash. Neurologic: Mental status is normal, cranial nerves are intact, moves all extremities equally.  (all labs ordered are listed, but only abnormal results are displayed) Labs Reviewed  BASIC METABOLIC PANEL WITH GFR - Abnormal; Notable for the following components:      Result Value   Sodium 133 (*)    Glucose, Bld 101 (*)    All other components within normal limits  CBC WITH DIFFERENTIAL/PLATELET     Procedures   Medications Ordered in the ED - No data to display                                  Medical Decision Making Amount and/or Complexity of Data Reviewed Labs: ordered.   Elevated blood pressure without evidence of any endorgan damage.  I have reviewed her laboratory tests, my interpretation is mild hyponatremia which is not felt to be clinically significant, borderline elevated random glucose which will need to be followed as an outpatient, normal CBC.  Patient does endorse that she had just completed a 1 week course of prednisone  for some respiratory issues, and that may have related to fluid retention and increased blood pressure.  At this point, no indication for emergent lowering of blood pressure.  I am instructing her to start checking her blood pressure daily.  I have recommended she take her furosemide   daily for the next 3 days because of her edema.  She will need to follow-up with her cardiologist to make recommendations for blood pressure medication adjustment if her blood pressure stays persistently elevated.  I have discussed with her those things which would require emergency department visit which may in the involved evidence of endorgan damage from high blood pressure..     Final diagnoses:  Elevated blood pressure reading with diagnosis of hypertension  Peripheral edema  Hyponatremia  Elevated random blood glucose level  Chronic anticoagulation    ED Discharge Orders     None          Raford Lenis, MD 05/19/24 [phone]

## 2024-05-19 NOTE — Telephone Encounter (Signed)
 Patient called back to check on the status of a medical form that needs to be complete for patient medication Zepbound . Please contact patient with update.  CB#[phone]

## 2024-05-20 NOTE — Telephone Encounter (Signed)
 Tried to all pt for clarity.

## 2024-05-20 NOTE — Telephone Encounter (Signed)
 Provider has asked which forms is she needing as we do not have forms for these medications.

## 2024-05-26 NOTE — Telephone Encounter (Unsigned)
 Copied from CRM [id number]. Topic: Clinical - Prescription Issue >> May 26, 2024 11:39 AM Revonda D wrote: Reason for CRM: Antonio with [facility] stated that the prescription for the tirzepatide  (ZEPBOUND ) 2.5 MG/0.5ML Pen was denied due to them not having the pen for the medication. Antonio stated that in order for the medication to be approved it needs to be updated from the pen to the vial and then have it resent to the pharmacy. >> May 26, 2024 11:46 AM Martinique E wrote: Patient called back regarding this. Would like a phone call to confirm if order could be changed from the pen to a vial.

## 2024-05-27 DIAGNOSIS — L309 Dermatitis, unspecified: Secondary | ICD-10-CM | POA: Diagnosis not present

## 2024-05-27 MED ORDER — TIRZEPATIDE-WEIGHT MANAGEMENT 2.5 MG/0.5ML ~~LOC~~ SOLN: 2.5000 mg | SUBCUTANEOUS | 3 refills | Status: DC

## 2024-05-27 NOTE — Addendum Note (Signed)
 Addended by: Gilberta Peeters V on: 05/27/2024 07:10 AM   Modules accepted: Orders

## 2024-05-27 NOTE — Telephone Encounter (Signed)
 Okay.  Thanks.

## 2024-05-28 DIAGNOSIS — R053 Chronic cough: Secondary | ICD-10-CM | POA: Diagnosis not present

## 2024-05-29 DIAGNOSIS — Z79899 Other long term (current) drug therapy: Secondary | ICD-10-CM | POA: Diagnosis not present

## 2024-06-01 ENCOUNTER — Telehealth: Payer: Self-pay | Admitting: Internal Medicine

## 2024-06-01 NOTE — Telephone Encounter (Signed)
 Patient c/o Palpitations:  STAT if patient reporting lightheadedness, shortness of breath, or chest pain  How long have you had palpitations/irregular HR/ Afib? Patient states the irregular heart beat has been going on for about a week. Are you having the symptoms now? no  Are you currently experiencing lightheadedness, SOB or CP? no  Do you have a history of afib (atrial fibrillation) or irregular heart rhythm? Patient states she has a history of A-fib, but this doesn't feeling like a-fib   Have you checked your BP or HR? (document readings if available): 138/104 last night  Are you experiencing any other symptoms? Patient states last night her HR was around 113 for a couple of hours but she took extra medication.  Patient is concerned because she is having a dental procedure tomorrow.

## 2024-06-01 NOTE — Telephone Encounter (Signed)
 Called patient back about message. Patient stated she has been having skip beats and racing heart beat for over a week and a half, which comes and goes.  Last night patient stated that her BP was 138/104 and HR 113. Patient took an extra dose of metoprolol  and took amlodipine  2.5 mg, which is an old medication she had and is not currently on. Now BP 117/73 and HR 60, patient stated she if feeling fine today. Encouraged patient not to take old medication. Informed her that the metoprolol  would also help with the BP, and with her heart rate. Patient stated she is feeling fine right now, and her dental procedure has been canceled due to a fever blister on her lip. Will send message to Dr. Okey for further advisement.

## 2024-06-02 MED ORDER — METOPROLOL TARTRATE 25 MG PO TABS: 50.0000 mg | ORAL_TABLET | Freq: Two times a day (BID) | ORAL | Status: DC

## 2024-06-08 DIAGNOSIS — M25562 Pain in left knee: Secondary | ICD-10-CM | POA: Diagnosis not present

## 2024-06-22 ENCOUNTER — Ambulatory Visit: Attending: Cardiovascular Disease

## 2024-06-22 DIAGNOSIS — Z01810 Encounter for preprocedural cardiovascular examination: Secondary | ICD-10-CM | POA: Diagnosis not present

## 2024-06-22 NOTE — Progress Notes (Signed)
 Virtual Visit via Telephone Note   Because of [employer] co-morbid illnesses, she is at least at moderate risk for complications without adequate follow up.  This format is felt to be most appropriate for this patient at this time.  Due to technical limitations with video connection (technology), today's appointment will be conducted as an audio only telehealth visit, and Amanda Wilkins verbally agreed to proceed in this manner.   All issues noted in this document were discussed and addressed.  No physical exam could be performed with this format.  Evaluation Performed:  Preoperative cardiovascular risk assessment _____________   Date:  06/22/2024   Patient ID:  Amanda Wilkins, DOB [birth year], MRN [id number] Patient Location:  Home Provider location:   Office  Primary Care Provider:  Garald Karlynn [name], MD Primary Cardiologist:  Amanda Gull, MD  Chief Complaint / Patient Profile   78 y.o. y/o female with a h/o atrial fibrillation, essential hypertension, palpitations, who is pending left total knee arthroplasty and presents today for telephonic preoperative cardiovascular risk assessment.  History of Present Illness    Analyah Wilkins is a 78 y.o. female who presents via audio/video conferencing for a telehealth visit today.  Pt was last seen in cardiology clinic on 01/20/2024 by Dr. Gull.  At that time Amanda Wilkins was doing well .  The patient is now pending procedure as outlined above. Since her last visit, she continues to be stable from a cardiac standpoint.  Today she denies chest pain, shortness of breath, lower extremity edema, fatigue, melena, hematuria, hemoptysis, diaphoresis, weakness, presyncope, syncope, orthopnea, and PND.  Past Medical History    Past Medical History:  Diagnosis Date   Acute bronchitis 09/11/2016   11/17 refractory   Acute cystitis without hematuria 05/17/2015   Acute kidney injury [facility])    Labs today   Acute right  ankle pain 09/09/2018   Anticoagulant long-term use    Xarelto    Axillary pain    Bronchitis    Cellulitis    Cholecystitis    Chronic interstitial nephritis    CONJUNCTIVITIS, ACUTE 10/18/2010   Qualifier: Diagnosis of  By: Plotnikov MD, Karlynn Wilkins    Coronary artery disease    Mild   D-dimer, elevated    Depression    Dysrhythmia    A. Fib/A. Flutter, AVT   Eye inflammation    GERD (gastroesophageal reflux disease)    History of interstitial nephritis 2016   chronic   History of kidney stones    has a small one found on xray   History of nuclear stress test 12/16/2014   Intermediate risk nuclear study w/ medium size moderate severity reversible defect in the basal and mid inferolateral and inferior wall (per dr cardiology note , dr Amanda Wilkins did not think this was consistent with ischemia)/  normal LV function and wall motion, ef 75%   History of septic shock 01/22/2015   in setting Group A Strep Cellulits erysipelas/chest wall induration with streptoccocus basterium-- Severe sepsis, DIC, Acute respiratory failure with pulmonary edema, Acute Kidney failure with chronic interstitial nephritis   Hypertension    Hyponatremia    Migraines    on Zoloft  for migraines   Mild carotid artery disease (HCC)    per duplex 08-30-2017 bilateral ICA 1-39%   OA (osteoarthritis) rheumotologist-  dr Amanda Wilkins   both knees,  shoulders, ankles   OSA on CPAP     moderate obstructive sleep apnea with an AHI of 23.4/h and oxygen  desaturations as  low as 84%.  Now on CPAP at 12 cm H2O.   PAF (paroxysmal atrial fibrillation) [facility]) 2009   cardiologist-- dr Amanda Wilkins   Paroxysmal atrial flutter [facility])    a. dx 11/2017.   PSVT (paroxysmal supraventricular tachycardia) (HCC)    RA (rheumatoid arthritis) (HCC)    Wears contact lenses    Past Surgical History:  Procedure Laterality Date   BREAST BIOPSY Right 05/15/2017   PASH   CARDIOVERSION N/A 08/28/2021   Procedure: CARDIOVERSION;  Surgeon:  Wilkins Amanda [name], MD;  Location: [facility] ENDOSCOPY;  Service: Cardiovascular;  Laterality: N/A;   CESAREAN SECTION  1980   CHOLECYSTECTOMY N/A 11/16/2021   Procedure: LAPAROSCOPIC CHOLECYSTECTOMY;  Surgeon: Amanda Calamity, MD;  Location: [facility] OR;  Service: General;  Laterality: N/A;   COLONOSCOPY     ESOPHAGOGASTRODUODENOSCOPY N/A 02/08/2015   Procedure: ESOPHAGOGASTRODUODENOSCOPY (EGD);  Surgeon: Amanda JONETTA Aho, MD;  Location: [facility] ENDOSCOPY;  Service: Endoscopy;  Laterality: N/A;   KNEE ARTHROSCOPY W/ MENISCAL REPAIR Left 08/2014    @[facility]    SHOULDER SURGERY Right 04/04/2016   TOTAL HIP ARTHROPLASTY Right 06/19/2022   Procedure: RIGHT TOTAL HIP ARTHROPLASTY ANTERIOR APPROACH;  Surgeon: Amanda Lonni GRADE, MD;  Location: [facility] OR;  Service: Orthopedics;  Laterality: Right;   TOTAL KNEE ARTHROPLASTY Right 09/01/2018   Procedure: RIGHT TOTAL KNEE ARTHROPLASTY;  Surgeon: Amanda Kemps, MD;  Location: [facility] ORS;  Service: Orthopedics;  Laterality: Right;    Allergies  Allergies  Allergen Reactions   Doxycycline  Hives   Epinephrine  Base Other (See Comments)    Seriously increases heart rate   Aspirin  Other (See Comments)    Can take the coated 325 mg. Plain asa 325 mg speeds up the heart.   Benadryl  [Diphenhydramine ]     Increased heart rate   Cefdinir  Diarrhea   Covid-19 Ad26 Vaccine(J[employer]sen) Hives     hives 6 hrs after her COVID 19 2nd booster - resolved    Fish Allergy  Hives    Was imitation crab meat and broke out in hives. Believe to be from the preservatives   Hylan G-F 20 Other (See Comments)    Very painful (knee injection)   Penicillins Other (See Comments)    Pt previously has been told not to take because of family history of reactions (water  blisters). She took amoxicillin  in 2012-2013 with no reaction Pt received ancef  on 11-16-2021 without issue   Tape Other (See Comments)    Blisters from adhesive tape   Wound Dressing Adhesive Other (See Comments)    Blisters from adhesive tape     Home Medications    Prior to Admission medications   Medication Sig Start Date End Date Taking? Authorizing Provider  acetaminophen  (TYLENOL ) 500 MG tablet Take 1,000 mg by mouth every 6 (six) hours as needed for moderate pain (pain score 4-6).    [provider]  ALPRAZolam  (XANAX ) 0.25 MG tablet Take 1 tablet (0.25 mg total) by mouth 2 (two) times daily as needed for anxiety. 10/30/23   Plotnikov, Aleksei V, MD  benzonatate  (TESSALON ) 200 MG capsule Take 1 capsule (200 mg total) by mouth 2 (two) times daily as needed for cough. Patient not taking: Reported on 06/19/2024 04/24/24   Alvia Corean CROME, FNP  cholecalciferol  (VITAMIN D3) 25 MCG (1000 UT) tablet Take 1,000 Units by mouth daily.    [provider]  ELIQUIS  5 MG TABS tablet TAKE 1 TABLET(5 MG) BY MOUTH TWICE DAILY 05/11/24   Wilkins Amanda [name], MD  famotidine  (PEPCID ) 20  MG tablet TAKE 1 TABLET(20 MG) BY MOUTH AT BEDTIME 01/20/24   Plotnikov, Aleksei V, MD  flecainide  (TAMBOCOR ) 50 MG tablet TAKE 1 AND 1/2 TABLETS(75 MG) BY MOUTH TWICE DAILY 10/29/23   Okey Amanda [name], MD  fluticasone  (FLONASE ) 50 MCG/ACT nasal spray Place 2 sprays into both nostrils daily as needed for allergies or rhinitis.    [provider]  furosemide  (LASIX ) 20 MG tablet Take 20 mg by mouth daily as needed for edema or fluid.    [provider]  HYDROcodone -acetaminophen  (NORCO/VICODIN) 5-325 MG tablet Take 1 tablet by mouth every 6 (six) hours as needed for severe pain (pain score 7-10). 02/12/24   Plotnikov, Aleksei V, MD  hydroxychloroquine  (PLAQUENIL ) 200 MG tablet Take 200 mg by mouth every morning.    [provider]  levalbuterol  (XOPENEX  HFA) 45 MCG/ACT inhaler Inhale 2 puffs into the lungs every 6 (six) hours as needed for wheezing. 01/16/24   Desai, Nikita S, MD  metoprolol  tartrate (LOPRESSOR ) 25 MG tablet Take 2 tablets (50 mg total) by mouth 2 (two) times daily. 06/02/24   Okey Amanda [name], MD  potassium chloride   (KLOR-CON ) 10 MEQ tablet Take 2 tablets by mouth daily Patient taking differently: Take 20 mEq by mouth daily as needed (Takes with Lasix ). 07/19/23   Nahser, Aleene PARAS, MD  rosuvastatin  (CRESTOR ) 5 MG tablet Take 1 tablet (5 mg total) by mouth daily. 10/30/23   Plotnikov, Karlynn [name], MD  sertraline  (ZOLOFT ) 50 MG tablet TAKE 1 TABLET BY MOUTH DAILY 02/04/24   Plotnikov, Aleksei V, MD  tirzepatide  (ZEPBOUND ) 2.5 MG/0.5ML injection vial Inject 2.5 mg into the skin once a week. 05/27/24   Plotnikov, Aleksei V, MD  triamcinolone  ointment (KENALOG ) 0.1 % Apply 1 Application topically 2 (two) times daily as needed (irritation). 05/27/24   [provider]    Physical Exam    Vital Signs:  Amanda Wilkins does not have vital signs available for review today.  Given telephonic nature of communication, physical exam is limited. AAOx3. NAD. Normal affect.  Speech and respirations are unlabored.  Accessory Clinical Findings    None  Assessment & Plan    1.  Preoperative Cardiovascular Risk Assessment:LEFT TOTAL KNEE ARTHROPLASTY   Date of Surgery:  Clearance 07/06/24                                  Surgeon:  DR. DEMPSEY MOAN Surgeon's Group or Practice Name:  [facility] Phone number:  [phone] KERRI MAZE Fax number:  [phone]      Primary Cardiologist: Amanda Okey, MD  Chart reviewed as part of pre-operative protocol coverage. Given past medical history and time since last visit, based on ACC/AHA guidelines, Cambelle Suchecki would be at acceptable risk for the planned procedure without further cardiovascular testing.   Her RCRI is very low risk, 0.4% risk of major cardiac event.  She is able to complete greater than 4 METS of physical activity.  Patient was advised that if she develops new symptoms prior to surgery to contact our office to arrange a follow-up appointment.  She verbalized understanding.  Per office protocol, patient can hold Eliquis  for 3 days prior  to procedure.   Patient will not need bridging with Lovenox  (enoxaparin ) around procedure.  I will route this recommendation to the requesting party via Epic fax function and remove from pre-op pool.       Time:  Today, I have spent 11 minutes with the patient with telehealth technology discussing medical history, symptoms, and management plan.  I spent 10 minutes reviewing patient's past cardiac history and cardiac medications.    Amanda CHRISTELLA Beauvais, NP  06/22/2024, 7:22 AM

## 2024-06-22 NOTE — Progress Notes (Signed)
 COVID Vaccine received:  []  No []  Yes Date of any COVID positive Test in last 90 days:  PCP - Karlynn Noel MD Cardiologist - Dr. Vina Gull  Chest x-ray - 04/24/24 Epic EKG -  10/28/23 Epic Stress Test - 05/31/20 Epic ECHO - 09/25/23 Epic Cardiac Cath -   Bowel Prep - [x]  No  []   Yes ______  Pacemaker / ICD device []  No []  Yes   Spinal Cord Stimulator:[]  No []  Yes       History of Sleep Apnea? []  No []  Yes   CPAP used?- []  No []  Yes    Does the patient monitor blood sugar?          []  No []  Yes  []  N/A  Patient has: []  NO Hx DM   []  Pre-DM                 []  DM1  []   DM2 Does patient have a [employer] or Dexacom? []  No []  Yes   Fasting Blood Sugar Ranges-  Checks Blood Sugar _____ times a day  GLP1 agonist / usual dose -  GLP1 instructions:  SGLT-2 inhibitors / usual dose -  SGLT-2 instructions:   Blood Thinner / Instructions: Aspirin  Instructions:  Comments:   Activity level: Patient is able / unable to climb a flight of stairs without difficulty; []  No CP  []  No SOB, but would have ___   Patient can / can not perform ADLs without assistance.   Anesthesia review:   Patient denies shortness of breath, fever, cough and chest pain at PAT appointment.  Patient verbalized understanding and agreement to the Pre-Surgical Instructions that were given to them at this PAT appointment. Patient was also educated of the need to review these PAT instructions again prior to his/her surgery.I reviewed the appropriate phone numbers to call if they have any and questions or concerns.

## 2024-06-22 NOTE — Patient Instructions (Signed)
 SURGICAL WAITING ROOM VISITATION  Patients having surgery or a procedure may have no more than 2 support people in the waiting area - these visitors may rotate.    Children under the age of 82 must have an adult with them who is not the patient.  Visitors with respiratory illnesses are discouraged from visiting and should remain at home.  If the patient needs to stay at the hospital during part of their recovery, the visitor guidelines for inpatient rooms apply. Pre-op nurse will coordinate an appropriate time for 1 support person to accompany patient in pre-op.  This support person may not rotate.    Please refer to the [facility] website for the visitor guidelines for Inpatients (after your surgery is over and you are in a regular room).       Your procedure is scheduled on:    Report to [facility] Main Entrance    Report to admitting at AM   Call this number if you have problems the morning of surgery [phone]   Do not eat food :After Midnight.   After Midnight you may have the following liquids until ______ AM/ PM DAY OF SURGERY  Water  Non-Citrus Juices (without pulp, NO RED-Apple, White grape, White cranberry) Black Coffee (NO MILK/CREAM OR CREAMERS, sugar ok)  Clear Tea (NO MILK/CREAM OR CREAMERS, sugar ok) regular and decaf                             Plain Jell-O (NO RED)                                           Fruit ices (not with fruit pulp, NO RED)                                     Popsicles (NO RED)                                                               Sports drinks like Gatorade (NO RED)              Drink 2 Ensure/G2 drinks AT 10:00 PM the night before surgery.        The day of surgery:  Drink ONE (1) Pre-Surgery Clear Ensure or G2 at AM the morning of surgery. Drink in one sitting. Do not sip.  This drink was given to you during your hospital  pre-op appointment visit. Nothing else to drink after completing the  Pre-Surgery Clear  Ensure or G2.          If you have questions, please contact your surgeon's office.   FOLLOW BOWEL PREP AND ANY ADDITIONAL PRE OP INSTRUCTIONS YOU RECEIVED FROM YOUR SURGEON'S OFFICE!!!     Oral Hygiene is also important to reduce your risk of infection.                                    Remember - BRUSH YOUR TEETH THE MORNING  OF SURGERY WITH YOUR REGULAR TOOTHPASTE  DENTURES WILL BE REMOVED PRIOR TO SURGERY PLEASE DO NOT APPLY Poly grip OR ADHESIVES!!!   Do NOT smoke after Midnight   Stop all vitamins and herbal supplements 7 days before surgery.   Take these medicines the morning of surgery with A SIP OF WATER :   DO NOT TAKE ANY ORAL DIABETIC MEDICATIONS DAY OF YOUR SURGERY  Bring CPAP mask and tubing day of surgery.                              You may not have any metal on your body including hair pins, jewelry, and body piercing             Do not wear make-up, lotions, powders, perfumes/cologne, or deodorant  Do not wear nail polish including gel and S&S, artificial/acrylic nails, or any other type of covering on natural nails including finger and toenails. If you have artificial nails, gel coating, etc. that needs to be removed by a nail salon please have this removed prior to surgery or surgery may need to be canceled/ delayed if the surgeon/ anesthesia feels like they are unable to be safely monitored.   Do not shave  48 hours prior to surgery.               Men may shave face and neck.   Do not bring valuables to the hospital. [facility] IS NOT             RESPONSIBLE   FOR VALUABLES.   Contacts, glasses, dentures or bridgework may not be worn into surgery.   Bring small overnight bag day of surgery.   DO NOT BRING YOUR HOME MEDICATIONS TO THE HOSPITAL. PHARMACY WILL DISPENSE MEDICATIONS LISTED ON YOUR MEDICATION LIST TO YOU DURING YOUR ADMISSION IN THE HOSPITAL!    Patients discharged on the day of surgery will not be allowed to drive home.  Someone NEEDS to  stay with you for the first 24 hours after anesthesia.   Special Instructions: Bring a copy of your healthcare power of attorney and living will documents the day of surgery if you haven't scanned them before.              Please read over the following fact sheets you were given: IF YOU HAVE QUESTIONS ABOUT YOUR PRE-OP INSTRUCTIONS PLEASE CALL [phone].   If you received a COVID test during your pre-op visit  it is requested that you wear a mask when out in public, stay away from anyone that may not be feeling well and notify your surgeon if you develop symptoms. If you test positive for Covid or have been in contact with anyone that has tested positive in the last 10 days please notify you surgeon.    SURGICAL WAITING ROOM VISITATION  Patients having surgery or a procedure may have no more than 2 support people in the waiting area - these visitors may rotate.    Children under the age of 21 must have an adult with them who is not the patient.  Visitors with respiratory illnesses are discouraged from visiting and should remain at home.  If the patient needs to stay at the hospital during part of their recovery, the visitor guidelines for inpatient rooms apply. Pre-op nurse will coordinate an appropriate time for 1 support person to accompany patient in pre-op.  This support person may not rotate.    Please  refer to the [facility] website for the visitor guidelines for Inpatients (after your surgery is over and you are in a regular room).       Your procedure is scheduled on: 07/06/24   Report to [facility] Main Entrance    Report to admitting at 7 AM   Call this number if you have problems the morning of surgery [phone]   Do not eat food :After Midnight.   After Midnight you may have the following liquids until 6:25 AM DAY OF SURGERY  Water  Non-Citrus Juices (without pulp, NO RED-Apple, White grape, White cranberry) Black Coffee (NO MILK/CREAM OR CREAMERS, sugar  ok)  Clear Tea (NO MILK/CREAM OR CREAMERS, sugar ok) regular and decaf                             Plain Jell-O (NO RED)                                           Fruit ices (not with fruit pulp, NO RED)                                     Popsicles (NO RED)                                                               Sports drinks like Gatorade (NO RED)                The day of surgery:  Drink ONE (1) Pre-Surgery G2 at 6:25 AM the morning of surgery. Drink in one sitting. Do not sip.  This drink was given to you during your hospital  pre-op appointment visit. Nothing else to drink after completing the  Pre-Surgery drink  G2.     Oral Hygiene is also important to reduce your risk of infection.                                    Remember - BRUSH YOUR TEETH THE MORNING OF SURGERY WITH YOUR REGULAR TOOTHPASTE  DENTURES WILL BE REMOVED PRIOR TO SURGERY PLEASE DO NOT APPLY Poly grip OR ADHESIVES!!!   Stop all vitamins and herbal supplements 7 days before surgery.   Take these medicines the morning of surgery with A SIP OF WATER : pepcid , flecainide (tambocor ), Hydrocodone , Metoprolol , Rosuvastatin , Sertraline (zoloft ), tylenol  if needed, Xanax  if needed, nasal spray.  Bring CPAP mask and tubing day of surgery.                              You may not have any metal on your body including hair pins, jewelry, and body piercing             Do not wear make-up, lotions, powders, perfumes/cologne, or deodorant  Do not wear nail polish including gel and S&S, artificial/acrylic nails, or any other type of covering on natural nails including finger and toenails. If you have  artificial nails, gel coating, etc. that needs to be removed by a nail salon please have this removed prior to surgery or surgery may need to be canceled/ delayed if the surgeon/ anesthesia feels like they are unable to be safely monitored.   Do not shave  48 hours prior to surgery.    Do not bring valuables to the  hospital. [facility] IS NOT             RESPONSIBLE   FOR VALUABLES.   Contacts, glasses, dentures or bridgework may not be worn into surgery.   Bring small overnight bag day of surgery.   DO NOT BRING YOUR HOME MEDICATIONS TO THE HOSPITAL. PHARMACY WILL DISPENSE MEDICATIONS LISTED ON YOUR MEDICATION LIST TO YOU DURING YOUR ADMISSION IN THE HOSPITAL!    Patients discharged on the day of surgery will not be allowed to drive home.  Someone NEEDS to stay with you for the first 24 hours after anesthesia.   Special Instructions: Bring a copy of your healthcare power of attorney and living will documents the day of surgery if you haven't scanned them before.              Please read over the following fact sheets you were given: IF YOU HAVE QUESTIONS ABOUT YOUR PRE-OP INSTRUCTIONS PLEASE CALL [phone] [place]   If you received a COVID test during your pre-op visit  it is requested that you wear a mask when out in public, stay away from anyone that may not be feeling well and notify your surgeon if you develop symptoms. If you test positive for Covid or have been in contact with anyone that has tested positive in the last 10 days please notify you surgeon.      Pre-operative 5 CHG Bath Instructions   You can play a key role in reducing the risk of infection after surgery. Your skin needs to be as free of germs as possible. You can reduce the number of germs on your skin by washing with CHG (chlorhexidine  gluconate) soap before surgery. CHG is an antiseptic soap that kills germs and continues to kill germs even after washing.   DO NOT use if you have an allergy  to chlorhexidine /CHG or antibacterial soaps. If your skin becomes reddened or irritated, stop using the CHG and notify one of our RNs at [phone].   Please shower with the CHG soap starting 4 days before surgery using the following schedule:     Please keep in mind the following:  DO NOT shave, including legs and underarms,  starting the day of your first shower.   You may shave your face at any point before/day of surgery.  Place clean sheets on your bed the day you start using CHG soap. Use a clean washcloth (not used since being washed) for each shower. DO NOT sleep with pets once you start using the CHG.   CHG Shower Instructions:  If you choose to wash your hair and private area, wash first with your normal shampoo/soap.  After you use shampoo/soap, rinse your hair and body thoroughly to remove shampoo/soap residue.  Turn the water  OFF and apply about 3 tablespoons (45 ml) of CHG soap to a CLEAN washcloth.  Apply CHG soap ONLY FROM YOUR NECK DOWN TO YOUR TOES (washing for 3-5 minutes)  DO NOT use CHG soap on face, private areas, open wounds, or sores.  Pay special attention to the area where your surgery is being performed.  If you are having  back surgery, having someone wash your back for you may be helpful. Wait 2 minutes after CHG soap is applied, then you may rinse off the CHG soap.  Pat dry with a clean towel  Put on clean clothes/pajamas   If you choose to wear lotion, please use ONLY the CHG-compatible lotions on the back of this paper.     Additional instructions for the day of surgery: DO NOT APPLY any lotions, deodorants, cologne, or perfumes.   Put on clean/comfortable clothes.  Brush your teeth.  Ask your nurse before applying any prescription medications to the skin.      CHG Compatible Lotions   Aveeno Moisturizing lotion  Cetaphil Moisturizing Cream  Cetaphil Moisturizing Lotion  Clairol Herbal Essence Moisturizing Lotion, Dry Skin  Clairol Herbal Essence Moisturizing Lotion, Extra Dry Skin  Clairol Herbal Essence Moisturizing Lotion, Normal Skin  Curel Age Defying Therapeutic Moisturizing Lotion with Alpha Hydroxy  Curel Extreme Care Body Lotion  Curel Soothing Hands Moisturizing Hand Lotion  Curel Therapeutic Moisturizing Cream, Fragrance-Free  Curel Therapeutic Moisturizing  Lotion, Fragrance-Free  Curel Therapeutic Moisturizing Lotion, Original Formula  Eucerin Daily Replenishing Lotion  Eucerin Dry Skin Therapy Plus Alpha Hydroxy Crme  Eucerin Dry Skin Therapy Plus Alpha Hydroxy Lotion  Eucerin Original Crme  Eucerin Original Lotion  Eucerin Plus Crme Eucerin Plus Lotion  Eucerin TriLipid Replenishing Lotion  Keri Anti-Bacterial Hand Lotion  Keri Deep Conditioning Original Lotion Dry Skin Formula Softly Scented  Keri Deep Conditioning Original Lotion, Fragrance Free Sensitive Skin Formula  Keri Lotion Fast Absorbing Fragrance Free Sensitive Skin Formula  Keri Lotion Fast Absorbing Softly Scented Dry Skin Formula  Keri Original Lotion  Keri Skin Renewal Lotion Keri Silky Smooth Lotion  Keri Silky Smooth Sensitive Skin Lotion  Nivea Body Creamy Conditioning Oil  Nivea Body Extra Enriched Lotion  Nivea Body Original Lotion  Nivea Body Sheer Moisturizing Lotion Nivea Crme  Nivea Skin Firming Lotion  NutraDerm 30 Skin Lotion  NutraDerm Skin Lotion  NutraDerm Therapeutic Skin Cream  NutraDerm Therapeutic Skin Lotion  ProShield Protective Hand Cream  Incentive Spirometer  An incentive spirometer is a tool that can help keep your lungs clear and active. This tool measures how well you are filling your lungs with each breath. Taking long deep breaths may help reverse or decrease the chance of developing breathing (pulmonary) problems (especially infection) following: A long period of time when you are unable to move or be active. BEFORE THE PROCEDURE  If the spirometer includes an indicator to show your best effort, your nurse or respiratory therapist will set it to a desired goal. If possible, sit up straight or lean slightly forward. Try not to slouch. Hold the incentive spirometer in an upright position. INSTRUCTIONS FOR USE  Sit on the edge of your bed if possible, or sit up as far as you can in bed or on a chair. Hold the incentive spirometer in  an upright position. Breathe out normally. Place the mouthpiece in your mouth and seal your lips tightly around it. Breathe in slowly and as deeply as possible, raising the piston or the ball toward the top of the column. Hold your breath for 3-5 seconds or for as long as possible. Allow the piston or ball to fall to the bottom of the column. Remove the mouthpiece from your mouth and breathe out normally. Rest for a few seconds and repeat Steps 1 through 7 at least 10 times every 1-2 hours when you are awake. Take your time  and take a few normal breaths between deep breaths. The spirometer may include an indicator to show your best effort. Use the indicator as a goal to work toward during each repetition. After each set of 10 deep breaths, practice coughing to be sure your lungs are clear. If you have an incision (the cut made at the time of surgery), support your incision when coughing by placing a pillow or rolled up towels firmly against it. Once you are able to get out of bed, walk around indoors and cough well. You may stop using the incentive spirometer when instructed by your caregiver.  RISKS AND COMPLICATIONS Take your time so you do not get dizzy or light-headed. If you are in pain, you may need to take or ask for pain medication before doing incentive spirometry. It is harder to take a deep breath if you are having pain. AFTER USE Rest and breathe slowly and easily. It can be helpful to keep track of a log of your progress. Your caregiver can provide you with a simple table to help with this. If you are using the spirometer at home, follow these instructions: SEEK MEDICAL CARE IF:  You are having difficultly using the spirometer. You have trouble using the spirometer as often as instructed. Your pain medication is not giving enough relief while using the spirometer. You develop fever of 100.5 F (38.1 C) or higher. SEEK IMMEDIATE MEDICAL CARE IF:  You cough up bloody sputum that  had not been present before. You develop fever of 102 F (38.9 C) or greater. You develop worsening pain at or near the incision site. MAKE SURE YOU:  Understand these instructions. Will watch your condition. Will get help right away if you are not doing well or get worse. Document Released: 02/11/2007 Document Revised: 12/24/2011 Document Reviewed: 04/14/2007 [facility] Patient Information 2014 [place], [place].

## 2024-06-23 ENCOUNTER — Emergency Department (HOSPITAL_COMMUNITY)

## 2024-06-23 ENCOUNTER — Encounter (HOSPITAL_COMMUNITY): Payer: Self-pay

## 2024-06-23 ENCOUNTER — Other Ambulatory Visit: Payer: Self-pay

## 2024-06-23 ENCOUNTER — Inpatient Hospital Stay (HOSPITAL_COMMUNITY)
Admission: EM | Admit: 2024-06-23 | Discharge: 2024-06-26 | DRG: 309 | Disposition: A | Discharge status: Home / Self Care | Attending: Internal Medicine | Admitting: Internal Medicine

## 2024-06-23 ENCOUNTER — Encounter (HOSPITAL_COMMUNITY)
Admission: RE | Admit: 2024-06-23 | Discharge: 2024-06-23 | Disposition: A | Discharge status: Home / Self Care | Source: Ambulatory Visit | Attending: Orthopedic Surgery

## 2024-06-23 ENCOUNTER — Encounter (HOSPITAL_COMMUNITY): Payer: Self-pay | Admitting: *Deleted

## 2024-06-23 DIAGNOSIS — Z7985 Long-term (current) use of injectable non-insulin antidiabetic drugs: Secondary | ICD-10-CM | POA: Diagnosis not present

## 2024-06-23 DIAGNOSIS — I110 Hypertensive heart disease with heart failure: Secondary | ICD-10-CM | POA: Diagnosis present

## 2024-06-23 DIAGNOSIS — I4892 Unspecified atrial flutter: Secondary | ICD-10-CM | POA: Diagnosis present

## 2024-06-23 DIAGNOSIS — Z881 Allergy status to other antibiotic agents status: Secondary | ICD-10-CM

## 2024-06-23 DIAGNOSIS — Z96651 Presence of right artificial knee joint: Secondary | ICD-10-CM | POA: Diagnosis present

## 2024-06-23 DIAGNOSIS — I471 Supraventricular tachycardia, unspecified: Secondary | ICD-10-CM | POA: Diagnosis not present

## 2024-06-23 DIAGNOSIS — E871 Hypo-osmolality and hyponatremia: Secondary | ICD-10-CM | POA: Diagnosis not present

## 2024-06-23 DIAGNOSIS — I4719 Other supraventricular tachycardia: Secondary | ICD-10-CM | POA: Diagnosis present

## 2024-06-23 DIAGNOSIS — Z886 Allergy status to analgesic agent status: Secondary | ICD-10-CM | POA: Diagnosis not present

## 2024-06-23 DIAGNOSIS — G43909 Migraine, unspecified, not intractable, without status migrainosus: Secondary | ICD-10-CM | POA: Diagnosis present

## 2024-06-23 DIAGNOSIS — F418 Other specified anxiety disorders: Secondary | ICD-10-CM | POA: Diagnosis not present

## 2024-06-23 DIAGNOSIS — I5032 Chronic diastolic (congestive) heart failure: Secondary | ICD-10-CM | POA: Diagnosis present

## 2024-06-23 DIAGNOSIS — Z01818 Encounter for other preprocedural examination: Secondary | ICD-10-CM

## 2024-06-23 DIAGNOSIS — R55 Syncope and collapse: Secondary | ICD-10-CM | POA: Diagnosis present

## 2024-06-23 DIAGNOSIS — I2510 Atherosclerotic heart disease of native coronary artery without angina pectoris: Secondary | ICD-10-CM | POA: Diagnosis present

## 2024-06-23 DIAGNOSIS — Z79899 Other long term (current) drug therapy: Secondary | ICD-10-CM | POA: Diagnosis not present

## 2024-06-23 DIAGNOSIS — Z7901 Long term (current) use of anticoagulants: Secondary | ICD-10-CM | POA: Diagnosis not present

## 2024-06-23 DIAGNOSIS — I495 Sick sinus syndrome: Principal | ICD-10-CM | POA: Diagnosis present

## 2024-06-23 DIAGNOSIS — I700 Atherosclerosis of aorta: Secondary | ICD-10-CM | POA: Diagnosis present

## 2024-06-23 DIAGNOSIS — I498 Other specified cardiac arrhythmias: Secondary | ICD-10-CM | POA: Diagnosis present

## 2024-06-23 DIAGNOSIS — E66812 Obesity, class 2: Secondary | ICD-10-CM | POA: Diagnosis present

## 2024-06-23 DIAGNOSIS — I10 Essential (primary) hypertension: Secondary | ICD-10-CM | POA: Diagnosis not present

## 2024-06-23 DIAGNOSIS — R001 Bradycardia, unspecified: Secondary | ICD-10-CM | POA: Diagnosis not present

## 2024-06-23 DIAGNOSIS — Z887 Allergy status to serum and vaccine status: Secondary | ICD-10-CM

## 2024-06-23 DIAGNOSIS — I480 Paroxysmal atrial fibrillation: Secondary | ICD-10-CM | POA: Diagnosis present

## 2024-06-23 DIAGNOSIS — Z888 Allergy status to other drugs, medicaments and biological substances status: Secondary | ICD-10-CM

## 2024-06-23 DIAGNOSIS — E785 Hyperlipidemia, unspecified: Secondary | ICD-10-CM | POA: Diagnosis not present

## 2024-06-23 DIAGNOSIS — Z91048 Other nonmedicinal substance allergy status: Secondary | ICD-10-CM

## 2024-06-23 DIAGNOSIS — F32A Depression, unspecified: Secondary | ICD-10-CM | POA: Diagnosis present

## 2024-06-23 DIAGNOSIS — Z9049 Acquired absence of other specified parts of digestive tract: Secondary | ICD-10-CM

## 2024-06-23 DIAGNOSIS — M06072 Rheumatoid arthritis without rheumatoid factor, left ankle and foot: Secondary | ICD-10-CM | POA: Diagnosis not present

## 2024-06-23 DIAGNOSIS — Z6839 Body mass index (BMI) 39.0-39.9, adult: Secondary | ICD-10-CM

## 2024-06-23 DIAGNOSIS — M06071 Rheumatoid arthritis without rheumatoid factor, right ankle and foot: Secondary | ICD-10-CM | POA: Diagnosis not present

## 2024-06-23 DIAGNOSIS — Z880 Allergy status to penicillin: Secondary | ICD-10-CM

## 2024-06-23 DIAGNOSIS — Z96641 Presence of right artificial hip joint: Secondary | ICD-10-CM | POA: Diagnosis present

## 2024-06-23 DIAGNOSIS — Z01812 Encounter for preprocedural laboratory examination: Secondary | ICD-10-CM | POA: Insufficient documentation

## 2024-06-23 DIAGNOSIS — G4733 Obstructive sleep apnea (adult) (pediatric): Secondary | ICD-10-CM | POA: Diagnosis not present

## 2024-06-23 DIAGNOSIS — Z87442 Personal history of urinary calculi: Secondary | ICD-10-CM

## 2024-06-23 DIAGNOSIS — R079 Chest pain, unspecified: Secondary | ICD-10-CM | POA: Diagnosis not present

## 2024-06-23 DIAGNOSIS — M069 Rheumatoid arthritis, unspecified: Secondary | ICD-10-CM | POA: Diagnosis present

## 2024-06-23 DIAGNOSIS — R000 Tachycardia, unspecified: Secondary | ICD-10-CM | POA: Diagnosis not present

## 2024-06-23 DIAGNOSIS — Z91013 Allergy to seafood: Secondary | ICD-10-CM

## 2024-06-23 DIAGNOSIS — R9431 Abnormal electrocardiogram [ECG] [EKG]: Secondary | ICD-10-CM | POA: Diagnosis not present

## 2024-06-23 DIAGNOSIS — Z862 Personal history of diseases of the blood and blood-forming organs and certain disorders involving the immune mechanism: Secondary | ICD-10-CM

## 2024-06-23 LAB — CBC
HCT: 44.4 % (ref 36.0–46.0)
Hemoglobin: 15.2 g/dL — ABNORMAL HIGH (ref 12.0–15.0)
MCH: 32.4 pg (ref 26.0–34.0)
MCHC: 34.2 g/dL (ref 30.0–36.0)
MCV: 94.7 fL (ref 80.0–100.0)
Platelets: 201 K/uL (ref 150–400)
RBC: 4.69 MIL/uL (ref 3.87–5.11)
RDW: 12.4 % (ref 11.5–15.5)
WBC: 6.4 K/uL (ref 4.0–10.5)
nRBC: 0 % (ref 0.0–0.2)

## 2024-06-23 LAB — SURGICAL PCR SCREEN
MRSA, PCR: NEGATIVE
Staphylococcus aureus: NEGATIVE

## 2024-06-23 LAB — BASIC METABOLIC PANEL WITH GFR
Anion gap: 16 — ABNORMAL HIGH (ref 5–15)
BUN: 11 mg/dL (ref 8–23)
CO2: 19 mmol/L — ABNORMAL LOW (ref 22–32)
Calcium: 10.3 mg/dL (ref 8.9–10.3)
Chloride: 87 mmol/L — ABNORMAL LOW (ref 98–111)
Creatinine, Ser: 0.84 mg/dL (ref 0.44–1.00)
GFR, Estimated: >60 mL/min (ref >60)
Glucose, Bld: 101 mg/dL — ABNORMAL HIGH (ref 70–99)
Potassium: 5.2 mmol/L — ABNORMAL HIGH (ref 3.5–5.1)
Sodium: 122 mmol/L — ABNORMAL LOW (ref 135–145)

## 2024-06-23 LAB — TROPONIN T, HIGH SENSITIVITY
Troponin T High Sensitivity: <15 ng/L (ref 0–19)
Troponin T High Sensitivity: <15 ng/L (ref 0–19)

## 2024-06-23 LAB — COMPREHENSIVE METABOLIC PANEL WITH GFR
ALT: 38 U/L (ref 0–44)
AST: 39 U/L (ref 15–41)
Albumin: 4.5 g/dL (ref 3.5–5.0)
Alkaline Phosphatase: 82 U/L (ref 38–126)
Anion gap: 14 (ref 5–15)
BUN: 10 mg/dL (ref 8–23)
CO2: 19 mmol/L — ABNORMAL LOW (ref 22–32)
Calcium: 9.8 mg/dL (ref 8.9–10.3)
Chloride: 89 mmol/L — ABNORMAL LOW (ref 98–111)
Creatinine, Ser: 0.74 mg/dL (ref 0.44–1.00)
GFR, Estimated: >60 mL/min (ref >60)
Glucose, Bld: 104 mg/dL — ABNORMAL HIGH (ref 70–99)
Potassium: 4.7 mmol/L (ref 3.5–5.1)
Sodium: 123 mmol/L — ABNORMAL LOW (ref 135–145)
Total Bilirubin: 0.9 mg/dL (ref 0.0–1.2)
Total Protein: 7.2 g/dL (ref 6.5–8.1)

## 2024-06-23 LAB — PHOSPHORUS: Phosphorus: 2.8 mg/dL (ref 2.5–4.6)

## 2024-06-23 LAB — SODIUM, URINE, RANDOM: Sodium, Ur: <30 mmol/L

## 2024-06-23 LAB — MAGNESIUM: Magnesium: 2.3 mg/dL (ref 1.7–2.4)

## 2024-06-23 LAB — TSH: TSH: 4.09 u[IU]/mL (ref 0.350–4.500)

## 2024-06-23 LAB — OSMOLALITY: Osmolality: 266 mosm/kg — ABNORMAL LOW (ref 275–295)

## 2024-06-23 LAB — OSMOLALITY, URINE: Osmolality, Ur: 341 mosm/kg (ref 300–900)

## 2024-06-23 MED ORDER — SODIUM CHLORIDE 0.9 % IV SOLN: INTRAVENOUS | Status: AC

## 2024-06-23 MED ORDER — ACETAMINOPHEN 650 MG RE SUPP: 650.0000 mg | Freq: Four times a day (QID) | RECTAL | Status: DC | PRN

## 2024-06-23 MED ORDER — ONDANSETRON HCL 4 MG PO TABS: 4.0000 mg | ORAL_TABLET | Freq: Four times a day (QID) | ORAL | Status: DC | PRN

## 2024-06-23 MED ORDER — SERTRALINE HCL 25 MG PO TABS
50.0000 mg | ORAL_TABLET | Freq: Every day | ORAL | Status: DC
  Administered 2024-06-24 – 2024-06-26 (×3): 50 mg via ORAL
  Filled 2024-06-23: qty 1
  Filled 2024-06-23 (×2): qty 2

## 2024-06-23 MED ORDER — ALPRAZOLAM 0.25 MG PO TABS
0.2500 mg | ORAL_TABLET | Freq: Two times a day (BID) | ORAL | Status: DC | PRN
  Administered 2024-06-25: 0.25 mg via ORAL
  Filled 2024-06-23: qty 1

## 2024-06-23 MED ORDER — CHLORHEXIDINE GLUCONATE CLOTH 2 % EX PADS
6.0000 | MEDICATED_PAD | Freq: Every day | CUTANEOUS | Status: DC
  Administered 2024-06-23: 6 via TOPICAL

## 2024-06-23 MED ORDER — FAMOTIDINE 20 MG PO TABS
20.0000 mg | ORAL_TABLET | Freq: Every day | ORAL | Status: DC
  Administered 2024-06-23 – 2024-06-25 (×3): 20 mg via ORAL
  Filled 2024-06-23 (×3): qty 1

## 2024-06-23 MED ORDER — ROSUVASTATIN CALCIUM 5 MG PO TABS
5.0000 mg | ORAL_TABLET | Freq: Every day | ORAL | Status: DC
  Administered 2024-06-24 – 2024-06-26 (×3): 5 mg via ORAL
  Filled 2024-06-23 (×3): qty 1

## 2024-06-23 MED ORDER — ORAL CARE MOUTH RINSE: 15.0000 mL | OROMUCOSAL | Status: DC | PRN

## 2024-06-23 MED ORDER — ACETAMINOPHEN 325 MG PO TABS
650.0000 mg | ORAL_TABLET | Freq: Four times a day (QID) | ORAL | Status: DC | PRN
  Administered 2024-06-26: 650 mg via ORAL
  Filled 2024-06-23: qty 2

## 2024-06-23 MED ORDER — ONDANSETRON HCL 4 MG/2ML IJ SOLN: 4.0000 mg | Freq: Four times a day (QID) | INTRAMUSCULAR | Status: DC | PRN

## 2024-06-23 MED ORDER — APIXABAN 5 MG PO TABS
5.0000 mg | ORAL_TABLET | Freq: Two times a day (BID) | ORAL | Status: DC
  Administered 2024-06-23 – 2024-06-26 (×6): 5 mg via ORAL
  Filled 2024-06-23 (×6): qty 1

## 2024-06-23 MED ORDER — ENOXAPARIN SODIUM 40 MG/0.4ML IJ SOSY: 40.0000 mg | PREFILLED_SYRINGE | INTRAMUSCULAR | Status: DC

## 2024-06-23 MED ORDER — HYDROXYCHLOROQUINE SULFATE 200 MG PO TABS
200.0000 mg | ORAL_TABLET | Freq: Every day | ORAL | Status: DC
  Administered 2024-06-24 – 2024-06-26 (×3): 200 mg via ORAL
  Filled 2024-06-23 (×3): qty 1

## 2024-06-23 MED ORDER — HYDRALAZINE HCL 10 MG PO TABS
10.0000 mg | ORAL_TABLET | Freq: Three times a day (TID) | ORAL | Status: DC
  Administered 2024-06-23 – 2024-06-26 (×9): 10 mg via ORAL
  Filled 2024-06-23 (×9): qty 1

## 2024-06-23 NOTE — ED Notes (Signed)
 Dr Simon aware of pts HR irr and dropping to 36. Pt remains asymptomatic with episodes and continues deny CP. Has different feeling mid sternum at intervals.

## 2024-06-23 NOTE — Consult Note (Signed)
 Cardiology Consultation  Patient ID: Amanda Wilkins MRN: [id number]; DOB: 1946/01/04  Admit date: 06/23/2024 Date of Consult: 06/23/2024  PCP:  Garald Karlynn [name], MD   [facility] Providers Cardiologist:  Vina Gull, MD     Patient Profile: Amanda Wilkins is a 78 y.o. female with a hx of paroxysmal atrial flutter and atrial fibrillation managed with Eliquis  and Flecainide , hypertension, chronic interstitial nephritis, obstructive sleep apnea managed with CPAP, paroxysmal SVT, mild carotid artery disease, hyperlipidemia, aortic atherosclerosis, mild mitral regurgitation, and chronic lower extremity edema  who is being seen 06/23/2024 for the evaluation of chest pain, junctional rhythm at the request of Dr. Simon.  History of Present Illness: [name] has past medical history as stated above.  She presented to the [facility] emergency department on 06/23/2024 from the preop assessment unit.  She was scheduled for a total knee arthroplasty next week and was being seen as preop.  She reported having a different feeling in her chest, she does not describe it as chest pain as well as near syncope while coming out of the bathroom.  She denies any prior sensation similar to this.  Her home meds include: Flecainide  75 mg BID, Lopressor  50 mg BID, Eliquis  5 mg BID, Lasix  20 mg as needed.  She denied any additional doses of her medications.  Relevant workup while in the ED includes: Initial EKG showed atrial fibrillation, HR 72 followed by an EKG that showed junctional rhythm, HR 38, CBC was fairly normal only with elevated hemoglobin of 15.2, BMP showed hyponatremia at 122, hyperkalemia 5.2, initial troponin negative, magnesium  2.3, CXR showed no acute cardiopulmonary findings.  She was last seen by Dr. Gull as an outpatient 01/20/2024 for follow-up regarding her hypertension, atrial flutter/fib.  At this appointment she was doing well, no symptoms.  She reported that she was taking  her Lasix  daily, seemed euvolemic however difficult to truly discern due to body habitus.  With her atrial fibrillation she was continuing flecainide , Eliquis , Lopressor .  She was not experiencing any chest pain at this appointment.  After speaking with the patient, she agrees with history as stated above.  She said that when she was walking into her appointment for her preoperative blood work today she began feeling a little bit off however it was nothing serious so she decided to continue on.  While she was in her appointment is when she really just started feeling funny, she mainly describes presyncopal symptoms such as lightheadedness and dizziness.  She tells me she felt like she was get a pass out, something she does not regularly feel, however she denied any full syncope.  She tells me that she had this pinpoint weird feeling in her chest, she points to the middle of her chest.  She tells me is not chest pain or fluttering or palpitations but she is not really able to describe what it is.  She tells me that she has been compliant with her flecainide , metoprolol , Eliquis .  She tells me that she takes her as needed Lasix  probably once a week at most.  Her only other medication changes is that she was recently started on Zepbound , around a month ago.  Otherwise she has had no new medication, diet, fluid intake changes.  She does tell me that while on her Zepbound  she has lost around 12 pounds, feeling like a lot of it might have been water  weight, which made her feel better.  She is currently resting comfortably in the emergency department,  pacer pads in place.  She appears to be more in atrial fibrillation with HR 50s to 60s at this time.  Past Medical History:  Diagnosis Date   Acute bronchitis 09/11/2016   11/17 refractory   Acute cystitis without hematuria 05/17/2015   Acute kidney injury [facility])    Labs today   Acute right ankle pain 09/09/2018   Anticoagulant long-term use    Xarelto    Axillary  pain    Bronchitis    Cellulitis    Cholecystitis    Chronic interstitial nephritis    CONJUNCTIVITIS, ACUTE 10/18/2010   Qualifier: Diagnosis of  By: Plotnikov MD, Karlynn [name]    Coronary artery disease    Mild   D-dimer, elevated    Depression    Dysrhythmia    A. Fib/A. Flutter, AVT   Eye inflammation    GERD (gastroesophageal reflux disease)    History of interstitial nephritis 2016   chronic   History of kidney stones    has a small one found on xray   History of nuclear stress test 12/16/2014   Intermediate risk nuclear study w/ medium size moderate severity reversible defect in the basal and mid inferolateral and inferior wall (per dr cardiology note , dr vina gull did not think this was consistent with ischemia)/  normal LV function and wall motion, ef 75%   History of septic shock 01/22/2015   in setting Group A Strep Cellulits erysipelas/chest wall induration with streptoccocus basterium-- Severe sepsis, DIC, Acute respiratory failure with pulmonary edema, Acute Kidney failure with chronic interstitial nephritis   Hypertension    Hyponatremia    Migraines    on Zoloft  for migraines   Mild carotid artery disease (HCC)    per duplex 08-30-2017 bilateral ICA 1-39%   OA (osteoarthritis) rheumotologist-  dr jon jacob   both knees,  shoulders, ankles   OSA on CPAP     moderate obstructive sleep apnea with an AHI of 23.4/h and oxygen  desaturations as low as 84%.  Now on CPAP at 12 cm H2O.   PAF (paroxysmal atrial fibrillation) [facility]) 2009   cardiologist-- dr vina gull   Paroxysmal atrial flutter [facility])    a. dx 11/2017.   PSVT (paroxysmal supraventricular tachycardia) (HCC)    RA (rheumatoid arthritis) (HCC)    Wears contact lenses    Past Surgical History:  Procedure Laterality Date   BREAST BIOPSY Right 05/15/2017   PASH   CARDIOVERSION N/A 08/28/2021   Procedure: CARDIOVERSION;  Surgeon: gull vina [name], MD;  Location: [facility] ENDOSCOPY;  Service: Cardiovascular;   Laterality: N/A;   CESAREAN SECTION  1980   CHOLECYSTECTOMY N/A 11/16/2021   Procedure: LAPAROSCOPIC CHOLECYSTECTOMY;  Surgeon: Rubin Calamity, MD;  Location: [facility] OR;  Service: General;  Laterality: N/A;   COLONOSCOPY     ESOPHAGOGASTRODUODENOSCOPY N/A 02/08/2015   Procedure: ESOPHAGOGASTRODUODENOSCOPY (EGD);  Surgeon: Lamar JONETTA Aho, MD;  Location: [facility] ENDOSCOPY;  Service: Endoscopy;  Laterality: N/A;   KNEE ARTHROSCOPY W/ MENISCAL REPAIR Left 08/2014    @[facility]    SHOULDER SURGERY Right 04/04/2016   TOTAL HIP ARTHROPLASTY Right 06/19/2022   Procedure: RIGHT TOTAL HIP ARTHROPLASTY ANTERIOR APPROACH;  Surgeon: Vernetta Lonni GRADE, MD;  Location: [facility] OR;  Service: Orthopedics;  Laterality: Right;   TOTAL KNEE ARTHROPLASTY Right 09/01/2018   Procedure: RIGHT TOTAL KNEE ARTHROPLASTY;  Surgeon: Rubie Kemps, MD;  Location: [facility] ORS;  Service: Orthopedics;  Laterality: Right;    Home Medications:  Prior to Admission medications   Medication Sig  Start Date End Date Taking? Authorizing Provider  acetaminophen  (TYLENOL ) 500 MG tablet Take 1,000 mg by mouth every 6 (six) hours as needed for moderate pain (pain score 4-6).    [provider]  ALPRAZolam  (XANAX ) 0.25 MG tablet Take 1 tablet (0.25 mg total) by mouth 2 (two) times daily as needed for anxiety. 10/30/23   Plotnikov, Karlynn [name], MD  benzonatate  (TESSALON ) 200 MG capsule Take 1 capsule (200 mg total) by mouth 2 (two) times daily as needed for cough. Patient not taking: Reported on 06/19/2024 04/24/24   Alvia Corean CROME, FNP  cholecalciferol  (VITAMIN D3) 25 MCG (1000 UT) tablet Take 1,000 Units by mouth daily.    [provider]  ELIQUIS  5 MG TABS tablet TAKE 1 TABLET(5 MG) BY MOUTH TWICE DAILY 05/11/24   Okey Vina [name], MD  famotidine  (PEPCID ) 20 MG tablet TAKE 1 TABLET(20 MG) BY MOUTH AT BEDTIME 01/20/24   Plotnikov, Aleksei V, MD  flecainide  (TAMBOCOR ) 50 MG tablet TAKE 1 AND 1/2 TABLETS(75 MG) BY MOUTH TWICE DAILY 10/29/23    Okey Vina [name], MD  fluticasone  (FLONASE ) 50 MCG/ACT nasal spray Place 2 sprays into both nostrils daily as needed for allergies or rhinitis.    [provider]  furosemide  (LASIX ) 20 MG tablet Take 20 mg by mouth daily as needed for edema or fluid.    [provider]  HYDROcodone -acetaminophen  (NORCO/VICODIN) 5-325 MG tablet Take 1 tablet by mouth every 6 (six) hours as needed for severe pain (pain score 7-10). 02/12/24   Plotnikov, Aleksei V, MD  hydroxychloroquine  (PLAQUENIL ) 200 MG tablet Take 200 mg by mouth every morning.    [provider]  levalbuterol  (XOPENEX  HFA) 45 MCG/ACT inhaler Inhale 2 puffs into the lungs every 6 (six) hours as needed for wheezing. 01/16/24   Desai, Nikita S, MD  metoprolol  tartrate (LOPRESSOR ) 25 MG tablet Take 2 tablets (50 mg total) by mouth 2 (two) times daily. 06/02/24   Okey Vina [name], MD  potassium chloride  (KLOR-CON ) 10 MEQ tablet Take 2 tablets by mouth daily Patient taking differently: Take 20 mEq by mouth daily as needed (Takes with Lasix ). 07/19/23   Nahser, Aleene PARAS, MD  rosuvastatin  (CRESTOR ) 5 MG tablet Take 1 tablet (5 mg total) by mouth daily. 10/30/23   Plotnikov, Karlynn [name], MD  sertraline  (ZOLOFT ) 50 MG tablet TAKE 1 TABLET BY MOUTH DAILY 02/04/24   Plotnikov, Aleksei V, MD  tirzepatide  (ZEPBOUND ) 2.5 MG/0.5ML injection vial Inject 2.5 mg into the skin once a week. 05/27/24   Plotnikov, Aleksei V, MD  triamcinolone  ointment (KENALOG ) 0.1 % Apply 1 Application topically 2 (two) times daily as needed (irritation). 05/27/24   [provider]    Scheduled Meds:  Continuous Infusions:  PRN Meds:   Allergies:    Allergies  Allergen Reactions   Doxycycline  Hives   Epinephrine  Base Other (See Comments)    Seriously increases heart rate   Aspirin  Other (See Comments)    Can take the coated 325 mg. Plain asa 325 mg speeds up the heart.   Benadryl  [Diphenhydramine ]     Increased heart rate   Cefdinir  Diarrhea    Covid-19 Ad26 Vaccine(Janssen) Hives     hives 6 hrs after her COVID 19 2nd booster - resolved    Fish Allergy  Hives    Was imitation crab meat and broke out in hives. Believe to be from the preservatives   Hylan G-F 20 Other (See Comments)    Very painful (knee injection)  Penicillins Other (See Comments)    Pt previously has been told not to take because of family history of reactions (water  blisters). She took amoxicillin  in 2012-2013 with no reaction Pt received ancef  on 11-16-2021 without issue   Tape Other (See Comments)    Blisters from adhesive tape   Wound Dressing Adhesive Other (See Comments)    Blisters from adhesive tape   Social History:   Social History   Socioeconomic History   Marital status: Married    Spouse name: Not on file   Number of children: 2   Years of education: Not on file   Highest education level: Associate degree: occupational, Scientist, product/process development, or vocational program  Occupational History   Occupation: retired    Associate Professor: UNEMPLOYED   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)  Tobacco Use   Smoking status: Never   Smokeless tobacco: Never  Vaping Use   Vaping status: Never Used  Substance and Sexual Activity   Alcohol use: Yes    Alcohol/week: 5.0 standard drinks of alcohol    Types: 5 Glasses of wine per week   Drug use: Never   Sexual activity: Yes    Partners: Male    Birth control/protection: Post-menopausal    Comment: intercourse age 29, sexual partners more than 5  Other Topics Concern   Not on file  Social History Narrative   Not on file   Social Drivers of Health   Financial Resource Strain: Low Risk  (04/23/2024)   Overall Financial Resource Strain (CARDIA)    Difficulty of Paying Living Expenses: Not hard at all  Food Insecurity: No Food Insecurity (04/23/2024)   Hunger Vital Sign    Worried About Running Out of Food in the Last Year: Never true    Ran Out of Food in the Last Year: Never true  Transportation Needs: No  Transportation Needs (04/23/2024)   PRAPARE - Administrator, Civil Service (Medical): No    Lack of Transportation (Non-Medical): No  Physical Activity: Insufficiently Active (04/23/2024)   Exercise Vital Sign    Days of Exercise per Week: 3 days    Minutes of Exercise per Session: 30 min  Stress: No Stress Concern Present (04/23/2024)   [employer] of Occupational Health - Occupational Stress Questionnaire    Feeling of Stress: Only a little  Social Connections: Moderately Isolated (04/23/2024)   Social Connection and Isolation Panel    Frequency of Communication with Friends and Family: More than three times a week    Frequency of Social Gatherings with Friends and Family: Once a week    Attends Religious Services: Never    Database administrator or Organizations: No    Attends Engineer, structural: Not on file    Marital Status: Married  Catering manager Violence: Not At Risk (03/26/2024)   Humiliation, Afraid, Rape, and Kick questionnaire    Fear of Current or Ex-Partner: No    Emotionally Abused: No    Physically Abused: No    Sexually Abused: No    Family History:   Family History  Problem Relation Age of Onset   Pancreatic cancer Brother    Lymphoma Mother    Prostate cancer Father        metastatic prostate cancer   Breast cancer Sister 83   Allergic rhinitis Sister    Urticaria Sister    Asthma Neg Hx    Eczema Neg Hx    Immunodeficiency Neg Hx    Atopy Neg Hx  Angioedema Neg Hx     ROS:  Please see the history of present illness.  All other ROS reviewed and negative.     Physical Exam/Data: Vitals:   06/23/24 1030 06/23/24 1035 06/23/24 1040 06/23/24 1230  BP: (!) 191/100 (!) 191/100  (!) 150/78  Pulse: (!) 58 74  (!) 50  Resp: 20 18  15   Temp: 98.5 F (36.9 C) 98.5 F (36.9 C)    TempSrc: Oral Oral    SpO2: 96% 100%  97%  Weight:   107 kg   Height:   5' 5 (1.651 m)    No intake or output data in the 24 hours ending  06/23/24 1316    06/23/2024   10:40 AM 04/30/2024   11:07 AM 04/24/2024    2:17 PM  Last 3 Weights  Weight (lbs) 236 lb 252 lb 247 lb  Weight (kg) 107.049 kg 114.306 kg 112.038 kg     Body mass index is 39.27 kg/m.  General:  Well nourished, well developed, in no acute distress HEENT: normal Neck: Difficult to assess JVD due to body habitus Vascular:  Distal pulses 2+ bilaterally Cardiac:  normal S1, S2; iRRR; no murmur  Lungs:  clear to auscultation bilaterally, no wheezing, rhonchi or rales  Abd: soft, nontender, no hepatomegaly  Ext: no edema Musculoskeletal:  No deformities, BUE and BLE strength normal and equal Skin: warm and dry  Neuro:  no focal abnormalities noted Psych:  Normal affect   EKG:  The EKG was personally reviewed and demonstrates: Atrial fibrillation, HR 70, follow-up EKG showed junctional rhythm with HR as low as 38  Telemetry:  Telemetry was personally reviewed and demonstrates: Atrial fibrillation, HR 50s to 60s  Relevant CV Studies:  Cardiac monitor, 11/15/2023 Impression:  Sinus rytm  Rate 46 to 102 bpm     Average HR 61 bpm    Rare PAC, rare PVC. Bursts of SVT, longest and fastest was 12 min 21 sec at 138 bpm maximal rate (average 97 bpm)    Triggered events correlated with sinus rhythm and sinus rhythm with PACs plus  SVT.  Echocardiogram, 09/25/2023 Left ventricular ejection fraction, by estimation, is 60 to 65% . The left ventricle has normal function. The left ventricle has no regional wall motion abnormalities. Diastolic dysfunction is not assessed due suboptimal images - no tissue doppler.  Right ventricular systolic function grossly normal. The right ventricular size is grossly normal. There is normal pulmonary artery systolic pressure.  The mitral valve was not well visualized. Trivial mitral valve regurgitation. No evidence of mitral stenosis.  The aortic valve was not well visualized.  The inferior vena cava is normal in size with greater than  50% respiratory variability, suggesting right atrial pressure of 3 mmHg.  Cardiopulmonary exercise test, 05/31/2020 Interpretation  Notes: Patient gave a very good effort. Pulse-oximetry remained 98-99% for the duration of exercise. Exercise was performed on a cycle ergometer starting at [facility] and increasing by 10W/min.  ECG:  Resting ECG in sinus rhythm. HR response blunted (consider beta-blockade). There were no sustained arrhythmias or ST-T changes. BP response hypertensive at peak exercise.   PFT:  Pre-exercise spirometry was within normal limits. The MVV was normal.  CPX:  Exercise testing with gas exchange demonstrates a normal peak VO2 of 12.9 ml/kg/min (101% of the age/gender/weight matched sedentary norms). The RER of 1.11 indicates a maximal effort. When adjusted to the patient's ideal body weight of 142.2 lb (64.5 kg) the peak VO2 is 21.4 ml/kg (  ibw)/min (124% of the ibw-adjusted predicted). The VE/VCO2 slope is moderately elevated and indicates increased dead space ventilation. The oxygen  uptake efficiency slope (OUES) is normal. The VO2 at the ventilatory threshold was normal at 95% of the predicted peak VO2. At peak exercise, the ventilation reached 68% of the measured MVV indicating ventilatory reserve remained. The O2pulse (a surrogate for stroke volume) increase with incremental exercise, reaching peak at 49ml/beat (122% predicted).  Conclusion: Exercise testing with gas exchange demonstrates normal functional capacity when compared to matched sedentary norms. There is no clear indication for cardiopulmonary abnormality. Patient's body habitus appears  Primary role in her exercise limitation. VE/VCO2 slope is elevated and could be indicative of increased pulmonary pressures, however appears more likely hyperventilation with low PETCO2. Patient was advised, if cardiology cleared, to begin 3x 10 min bouts of cardio exercise throughout her day to help improve dyspnea with activity.   CT calcium   score, 07/02/2019 Coronary calcium  score of 0.  This was 0 percentile for age and sex matched control.  Laboratory Data: High Sensitivity Troponin:  No results for input(s): TROPONINIHS in the last 720 hours.   Chemistry Recent Labs  Lab 06/23/24 1055  NA 122*  K 5.2*  CL 87*  CO2 19*  GLUCOSE 101*  BUN 11  CREATININE 0.84  CALCIUM  10.3  MG 2.3  GFRNONAA >60  ANIONGAP 16*    No results for input(s): PROT, ALBUMIN , AST, ALT, ALKPHOS, BILITOT in the last 168 hours. Lipids No results for input(s): CHOL, TRIG, HDL, LABVLDL, LDLCALC, CHOLHDL in the last 168 hours.  Hematology Recent Labs  Lab 06/23/24 1055  WBC 6.4  RBC 4.69  HGB 15.2*  HCT 44.4  MCV 94.7  MCH 32.4  MCHC 34.2  RDW 12.4  PLT 201   Thyroid   Recent Labs  Lab 06/23/24 1118  TSH 4.090    BNPNo results for input(s): BNP, PROBNP in the last 168 hours.  DDimer No results for input(s): DDIMER in the last 168 hours.  Radiology/Studies:  DG Chest Port 1 View Result Date: 06/23/2024 CLINICAL DATA:  Chest pain. EXAM: PORTABLE CHEST 1 VIEW COMPARISON:  04/24/2024. FINDINGS: The heart size and mediastinal contours are unchanged. Defibrillator pad overlies the left chest wall. Aortic atherosclerosis. No focal consolidation, pleural effusion, or pneumothorax. No acute osseous abnormality. IMPRESSION: No acute cardiopulmonary findings. Electronically Signed   By: Harrietta Sherry M.D.   On: 06/23/2024 11:39   Assessment and Plan:  Junctional rhythm Bradycardia She presented to hospital for preoperative assessment Reported feeling strange sensation in her chest, near syncope EKG showed junctional rhythm, HR 38 Initial troponin negative Patient denies any additional doses of home medications On the room patient denies any chest pain, currently asymptomatic Continue to monitor on telemetry Updating echocardiogram  Paroxysmal atrial fibrillation/flutter SVT Known history of atrial  flutter/fibrillation Long-term monitor from January 2025 showed no atrial fibrillation, SVT, rare ectopy Home meds: Flecainide  75 mg BID, Lopressor  50 mg BID, Eliquis  5 mg BID Initial EKG in preop assessment unit noted to be atrial fibrillation, HR 70s Pending TSH Continue to monitor on telemetry Monitoring updated echocardiogram Repletion of electrolytes per primary  Chronic HFpEF Chronic lower extremity edema Chronic shortness of breath Echo from 09/2023 showed: EF 60 to 65%, normal RV function, normal IVC Home meds: As needed Lasix  Ordering updated echocardiogram  Hyperlipidemia Home meds: Crestor  5 mg daily Continue home statin  Hypertension Hypertensive on admission, initial BP 191/100 Home meds: Lopressor  50 mg bid Previously known to be  intolerant amlodipine   Aortic atherosclerosis Chest imaging noted aortic atherosclerosis No active chest pain Initial troponin negative Continue blood pressure and cholesterol management as above  Per primary Hyponatremia OSA on CPAP  Risk Assessment/Risk Scores:    New [place]  Heart Association (NYHA) Functional Class NYHA Class I  CHA2DS2-VASc Score = 5   This indicates a 7.2% annual risk of stroke. The patient's score is based upon: CHF History: 0 HTN History: 1 Diabetes History: 0 Stroke History: 0 Vascular Disease History: 1 Age Score: 2 Gender Score: 1     For questions or updates, please contact [facility] Please consult [email or web address] for contact info under    Signed, Waddell DELENA Donath, PA-C  06/23/2024 1:16 PM

## 2024-06-23 NOTE — ED Provider Notes (Signed)
 [place] EMERGENCY DEPARTMENT AT [facility] Provider Note   CSN: [id number] Arrival date & time: 06/23/24  1018     Patient presents with: No chief complaint on file.   Amanda Wilkins is a 78 y.o. female.   HPI     Patient presents because of chest discomfort.  Patient states that she was going to her checkup today in the setting of her expected knee replacement.  Started having some almost some chest fluttering maybe some slight pressure as well.  Never had a thing like this happen before.  Patient does state that she has a history of atrial fibs/a flutter.  Currently, she is on flecainide , metoprolol  as well as Eliquis .  Has not missed any doses.  Did not take too much of her medication.  Denies a history of DVT or PE.  No syncopal episodes leading up to today.   Previous medical history reviewed : Follows up with cardiology.  History of atrial fibrillation and flutter.  Last Holter monitor in January 2025.  Sinus rhythm with rate of 46-1 02.  Rare PAC and very rare PVC.  Burst of SVT.  Echo in 2024 showed 55 to 60% EF.   Prior to Admission medications   Medication Sig Start Date End Date Taking? Authorizing Provider  lidocaine -prilocaine  (EMLA ) cream SMARTSIG:sparingly Topical PRN 05/14/24  Yes [provider]  acetaminophen  (TYLENOL ) 500 MG tablet Take 1,000 mg by mouth every 6 (six) hours as needed for moderate pain (pain score 4-6).    [provider]  ALPRAZolam  (XANAX ) 0.25 MG tablet Take 1 tablet (0.25 mg total) by mouth 2 (two) times daily as needed for anxiety. 10/30/23   Plotnikov, Aleksei V, MD  benzonatate  (TESSALON ) 200 MG capsule Take 1 capsule (200 mg total) by mouth 2 (two) times daily as needed for cough. Patient not taking: Reported on 06/19/2024 04/24/24   Alvia Corean CROME, FNP  cholecalciferol  (VITAMIN D3) 25 MCG (1000 UT) tablet Take 1,000 Units by mouth daily.    [provider]  ELIQUIS  5 MG TABS tablet TAKE 1  TABLET(5 MG) BY MOUTH TWICE DAILY 05/11/24   Okey Vina [name], MD  famotidine  (PEPCID ) 20 MG tablet TAKE 1 TABLET(20 MG) BY MOUTH AT BEDTIME 01/20/24   Plotnikov, Aleksei V, MD  flecainide  (TAMBOCOR ) 50 MG tablet TAKE 1 AND 1/2 TABLETS(75 MG) BY MOUTH TWICE DAILY 10/29/23   Okey Vina [name], MD  fluticasone  (FLONASE ) 50 MCG/ACT nasal spray Place 2 sprays into both nostrils daily as needed for allergies or rhinitis.    [provider]  furosemide  (LASIX ) 20 MG tablet Take 20 mg by mouth daily as needed for edema or fluid.    [provider]  HYDROcodone -acetaminophen  (NORCO/VICODIN) 5-325 MG tablet Take 1 tablet by mouth every 6 (six) hours as needed for severe pain (pain score 7-10). 02/12/24   Plotnikov, Aleksei V, MD  hydroxychloroquine  (PLAQUENIL ) 200 MG tablet Take 200 mg by mouth every morning.    [provider]  levalbuterol  (XOPENEX  HFA) 45 MCG/ACT inhaler Inhale 2 puffs into the lungs every 6 (six) hours as needed for wheezing. 01/16/24   Desai, Nikita S, MD  metoprolol  tartrate (LOPRESSOR ) 25 MG tablet Take 2 tablets (50 mg total) by mouth 2 (two) times daily. 06/02/24   Okey Vina [name], MD  potassium chloride  (KLOR-CON ) 10 MEQ tablet Take 2 tablets by mouth daily Patient taking differently: Take 20 mEq by mouth daily as needed (Takes with Lasix ). 07/19/23   Nahser, Aleene  J, MD  rosuvastatin  (CRESTOR ) 5 MG tablet Take 1 tablet (5 mg total) by mouth daily. 10/30/23   Plotnikov, Aleksei V, MD  sertraline  (ZOLOFT ) 50 MG tablet TAKE 1 TABLET BY MOUTH DAILY 02/04/24   Plotnikov, Aleksei V, MD  tirzepatide  (ZEPBOUND ) 2.5 MG/0.5ML injection vial Inject 2.5 mg into the skin once a week. 05/27/24   Plotnikov, Aleksei V, MD  triamcinolone  ointment (KENALOG ) 0.1 % Apply 1 Application topically 2 (two) times daily as needed (irritation). 05/27/24   [provider]    Allergies: Doxycycline , Epinephrine  base, Aspirin , Benadryl  [diphenhydramine ], Cefdinir , Covid-19 ad26 vaccine(janssen),  Fish allergy , Hylan g-f 20, Penicillins, Tape, and Wound dressing adhesive    Review of Systems  Constitutional:  Negative for chills and fever.  HENT:  Negative for ear pain and sore throat.   Eyes:  Negative for pain and visual disturbance.  Respiratory:  Negative for cough and shortness of breath.   Cardiovascular:  Negative for chest pain and palpitations.  Gastrointestinal:  Negative for abdominal pain and vomiting.  Genitourinary:  Negative for dysuria and hematuria.  Musculoskeletal:  Negative for arthralgias and back pain.  Skin:  Negative for color change and rash.  Neurological:  Negative for seizures and syncope.  All other systems reviewed and are negative.   Updated Vital Signs BP 139/64   Pulse 70   Temp 98.3 F (36.8 C) (Oral)   Resp 14   Ht 5' 5 (1.651 m)   Wt 108.5 kg   SpO2 97%   BMI 39.80 kg/m   Physical Exam Vitals and nursing note reviewed.  Constitutional:      General: She is not in acute distress.    Appearance: She is well-developed.  HENT:     Head: Normocephalic and atraumatic.  Eyes:     Conjunctiva/sclera: Conjunctivae normal.  Cardiovascular:     Rate and Rhythm: Normal rate and regular rhythm.     Heart sounds: No murmur heard. Pulmonary:     Effort: Pulmonary effort is normal. No respiratory distress.     Breath sounds: Normal breath sounds.  Abdominal:     Palpations: Abdomen is soft.     Tenderness: There is no abdominal tenderness.  Musculoskeletal:        General: No swelling.     Cervical back: Neck supple.  Skin:    General: Skin is warm and dry.     Capillary Refill: Capillary refill takes less than 2 seconds.  Neurological:     Mental Status: She is alert.  Psychiatric:        Mood and Affect: Mood normal.     (all labs ordered are listed, but only abnormal results are displayed) Labs Reviewed  CBC - Abnormal; Notable for the following components:      Result Value   Hemoglobin 15.2 (*)    All other components  within normal limits  MRSA NEXT GEN BY PCR, NASAL  MAGNESIUM   TSH  SODIUM, URINE, RANDOM  OSMOLALITY  OSMOLALITY, URINE  UREA  NITROGEN, URINE  COMPREHENSIVE METABOLIC PANEL WITH GFR  PHOSPHORUS  TROPONIN T, HIGH SENSITIVITY  TROPONIN T, HIGH SENSITIVITY    EKG: EKG Interpretation Date/Time:  Tuesday June 23 2024 10:34:52 EDT Ventricular Rate:  70 PR Interval:    QRS Duration:  110 QT Interval:  396 QTC Calculation: 428 R Axis:   50  Text Interpretation: Atrial fibrillation Low voltage, extremity leads Confirmed by Simon Rea [phone]) on 06/23/2024 11:22:28 AM  Radiology: [facility] Chest Port 1  View Result Date: 06/23/2024 CLINICAL DATA:  Chest pain. EXAM: PORTABLE CHEST 1 VIEW COMPARISON:  04/24/2024. FINDINGS: The heart size and mediastinal contours are unchanged. Defibrillator pad overlies the left chest wall. Aortic atherosclerosis. No focal consolidation, pleural effusion, or pneumothorax. No acute osseous abnormality. IMPRESSION: No acute cardiopulmonary findings. Electronically Signed   By: Harrietta Sherry M.D.   On: 06/23/2024 11:39     Procedures   Medications Ordered in the ED  acetaminophen  (TYLENOL ) tablet 650 mg (has no administration in time range)    Or  acetaminophen  (TYLENOL ) suppository 650 mg (has no administration in time range)  ondansetron  (ZOFRAN ) tablet 4 mg (has no administration in time range)    Or  ondansetron  (ZOFRAN ) injection 4 mg (has no administration in time range)  Chlorhexidine  Gluconate Cloth 2 % PADS 6 each (6 each Topical Given 06/23/24 1404)  Oral care mouth rinse (has no administration in time range)  hydrALAZINE  (APRESOLINE ) tablet 10 mg (has no administration in time range)    Clinical Course as of 06/23/24 1546  Tue Jun 23, 2024  1213 Cardiology: Hold flecanide and metoprolol  and can add metop back.   [TL]    Clinical Course User Index [TL] Simon Lavonia SAILOR, MD                                 Medical Decision Making Amount  and/or Complexity of Data Reviewed Labs: ordered. Radiology: ordered.  Risk Decision regarding hospitalization.      Patient presents because of chest discomfort.  Patient states that she was going to her checkup today in the setting of her expected knee replacement.  Started having some almost some chest fluttering maybe some slight pressure as well.  Never had a thing like this happen before.  Patient does state that she has a history of atrial fibs/a flutter.  Currently, she is on flecainide , metoprolol  as well as Eliquis .  Has not missed any doses.  Did not take too much of her medication.  Denies a history of DVT or PE.  No syncopal episodes leading up to today.   Previous medical history reviewed : Follows up with cardiology.  History of atrial fibrillation and flutter.  Last Holter monitor in January 2025.  Sinus rhythm with rate of 46-1 02.  Rare PAC and very rare PVC.  Burst of SVT.  Echo in 2024 showed 55 to 60% EF.   Initial EKG shows atrial fibrillation.  Patient has some pauses noted on cardiac telemetry.  Pauses up to 1.6 to 1.8 seconds.  Endorses some lightheaded events.  No syncope episode.  Maps appropriate.    Reviewed further cardiac telemetry.  Patient did have pauses up to 2 seconds at a time.  No syncopal episode.  Maps remained appropriate.  Patient had pads placed.  Never had to pace the patient.  I spoke to cardiology regarding the patient.  Recommend holding the flecainide  and metoprolol .  Can add back metoprolol  as needed whenever heart rate gets higher.  Magnesium  2.3.  Potassium 5.2.  Presently, patient sodium level of 122.  Patient is pending serum awesome's as well as urine studies to further clarify exactly why patient has hyponatremia at this time.  Asymptomatic in terms of mental status.  No indication for emergent correction.  Will hold on fluids until get more information regarding patient's serum osm.   Patient endorse some slight chest discomfort but  related more to  palpitations.  Did order initial troponin.  Undetectable.  Repeated but low concerns for any kind ACS process at this time.   CRITICAL CARE Performed by: Lavonia LOISE Pat   Total critical care time: 40 minutes  Critical care time was exclusive of separately billable procedures and treating other patients.  Critical care was necessary to treat or prevent imminent or life-threatening deterioration.  Critical care was time spent personally by me on the following activities: development of treatment plan with patient and/or surrogate as well as nursing, discussions with consultants, evaluation of patient's response to treatment, examination of patient, obtaining history from patient or surrogate, ordering and performing treatments and interventions, ordering and review of laboratory studies, ordering and review of radiographic studies, pulse oximetry and re-evaluation of patient's condition.     Final diagnoses:  Bradycardia  Hyponatremia    ED Discharge Orders     None          Pat Lavonia LOISE, MD 06/23/24 1546

## 2024-06-23 NOTE — Progress Notes (Signed)
   06/23/24 2219  BiPAP/CPAP/SIPAP  $ Non-Invasive Home Ventilator  Initial  $ Face Mask Medium Yes  BiPAP/CPAP/SIPAP Pt Type Adult  BiPAP/CPAP/SIPAP Resmed  Reason BIPAP/CPAP not in use Other(comment)  Mask Type Nasal mask  Dentures removed? Not applicable  Mask Size Medium  FiO2 (%) 21 %  Patient Home Machine No  Patient Home Mask No  Patient Home Tubing No  Auto Titrate Yes  Minimum cmH2O 4 cmH2O  Maximum cmH2O 15 cmH2O  Device Plugged into RED Power Outlet Yes  BiPAP/CPAP /SiPAP Vitals  Pulse Rate 77  Resp 18  BP (!) 169/55  SpO2 97 %  Bilateral Breath Sounds Clear;Diminished  MEWS Score/Color  MEWS Score 0  MEWS Score Color Green   CPAP setup at bedside for pt.  Pt stated she would put it on when ready for bed.

## 2024-06-23 NOTE — H&P (Signed)
 History and Physical    Patient: Amanda Wilkins FMW:[id number] DOB: [birth year] DOA: 06/23/2024 DOS: the patient was seen and examined on 06/23/2024 PCP: Plotnikov, Karlynn [name], MD  Patient coming from: Home  Chief Complaint: No chief complaint on file.  HPI: Amanda Wilkins is a 78 y.o. female with medical history significant of acute bronchitis, acute cystitis without hematuria, acute kidney injury, chronic interstitial nephritis, conjunctivitis, bronchitis, septic shock due to cellulitis, cholecystitis, depression, GERD, nephrolithiasis, hypertension, hyponatremia, migraine headaches, mild carotid artery disease, OSA on CPAP, PSVT, aortic atherosclerosis, mild mitral regurgitation, chronic lower extremity edema, paroxysmal atrial flutter, paroxysmal atrial fibrillation, history of cardioversion who was taken to the emergency department after she developed lightheadedness and dizziness associated with the nonpainful sensation in her chest when she went to get preoperative lab work.  Her symptoms got to the point that she felt she was going to pass out, but denied LOC.  She recently has started to tirzepatide .  She took several doses of furosemide  last week ago over the weekend.  No chest pain, diaphoresis, PND, orthopnea or pitting edema of the lower extremities.  No abdominal pain, nausea, emesis, diarrhea, constipation, melena or hematochezia. She denied fever, chills, rhinorrhea, sore throat, wheezing or hemoptysis.  No flank pain, dysuria, frequency or hematuria.  No polyuria, polydipsia, polyphagia or blurred vision.   ED course: Initial vital signs were temperature 98.5 F, pulse 54, respirations 191/100 mmHg O2 and sat 96% on room air.  Lab work: CBC showed a white count 6.4, hemoglobin 15.3 g/dL and platelets 798.  Normal TSH, magnesium  and troponin T level.  BMP done earlier today showed a sodium 122, potassium 5.2, chloride 87 and CO2 of 19 mmol/L with an anion gap of 16.  Glucose 101  mg/dL, normal renal function and calcium .  Imaging: Portable 1 view chest radiograph with no acute cardiopulmonary findings.   Review of Systems: As mentioned in the history of present illness. All other systems reviewed and are negative. Past Medical History:  Diagnosis Date   Acute bronchitis 09/11/2016   11/17 refractory   Acute cystitis without hematuria 05/17/2015   Acute kidney injury [facility])    Labs today   Acute right ankle pain 09/09/2018   Anticoagulant long-term use    Xarelto    Axillary pain    Bronchitis    Cellulitis    Cholecystitis    Chronic interstitial nephritis    CONJUNCTIVITIS, ACUTE 10/18/2010   Qualifier: Diagnosis of  By: Plotnikov MD, Karlynn [name]    Coronary artery disease    Mild   D-dimer, elevated    Depression    Dysrhythmia    A. Fib/A. Flutter, AVT   Eye inflammation    GERD (gastroesophageal reflux disease)    History of interstitial nephritis 2016   chronic   History of kidney stones    has a small one found on xray   History of nuclear stress test 12/16/2014   Intermediate risk nuclear study w/ medium size moderate severity reversible defect in the basal and mid inferolateral and inferior wall (per dr cardiology note , dr vina gull did not think this was consistent with ischemia)/  normal LV function and wall motion, ef 75%   History of septic shock 01/22/2015   in setting Group A Strep Cellulits erysipelas/chest wall induration with streptoccocus basterium-- Severe sepsis, DIC, Acute respiratory failure with pulmonary edema, Acute Kidney failure with chronic interstitial nephritis   Hypertension    Hyponatremia    Migraines  on Zoloft  for migraines   Mild carotid artery disease (HCC)    per duplex 08-30-2017 bilateral ICA 1-39%   OA (osteoarthritis) rheumotologist-  dr jon jacob   both knees,  shoulders, ankles   OSA on CPAP     moderate obstructive sleep apnea with an AHI of 23.4/h and oxygen  desaturations as low as 84%.  Now on  CPAP at 12 cm H2O.   PAF (paroxysmal atrial fibrillation) [facility]) 2009   cardiologist-- dr vina gull   Paroxysmal atrial flutter [facility])    a. dx 11/2017.   PSVT (paroxysmal supraventricular tachycardia) (HCC)    RA (rheumatoid arthritis) (HCC)    Wears contact lenses    Past Surgical History:  Procedure Laterality Date   BREAST BIOPSY Right 05/15/2017   PASH   CARDIOVERSION N/A 08/28/2021   Procedure: CARDIOVERSION;  Surgeon: gull vina [name], MD;  Location: [facility] ENDOSCOPY;  Service: Cardiovascular;  Laterality: N/A;   CESAREAN SECTION  1980   CHOLECYSTECTOMY N/A 11/16/2021   Procedure: LAPAROSCOPIC CHOLECYSTECTOMY;  Surgeon: Rubin Calamity, MD;  Location: [facility] OR;  Service: General;  Laterality: N/A;   COLONOSCOPY     ESOPHAGOGASTRODUODENOSCOPY N/A 02/08/2015   Procedure: ESOPHAGOGASTRODUODENOSCOPY (EGD);  Surgeon: Lamar JONETTA Aho, MD;  Location: [facility] ENDOSCOPY;  Service: Endoscopy;  Laterality: N/A;   KNEE ARTHROSCOPY W/ MENISCAL REPAIR Left 08/2014    @[facility]    SHOULDER SURGERY Right 04/04/2016   TOTAL HIP ARTHROPLASTY Right 06/19/2022   Procedure: RIGHT TOTAL HIP ARTHROPLASTY ANTERIOR APPROACH;  Surgeon: Vernetta Lonni GRADE, MD;  Location: [facility] OR;  Service: Orthopedics;  Laterality: Right;   TOTAL KNEE ARTHROPLASTY Right 09/01/2018   Procedure: RIGHT TOTAL KNEE ARTHROPLASTY;  Surgeon: Rubie Kemps, MD;  Location: [facility] ORS;  Service: Orthopedics;  Laterality: Right;   Social History:  reports that she has never smoked. She has never used smokeless tobacco. She reports current alcohol use of about 5.0 standard drinks of alcohol per week. She reports that she does not use drugs.  Allergies  Allergen Reactions   Doxycycline  Hives   Epinephrine  Base Other (See Comments)    Seriously increases heart rate   Aspirin  Other (See Comments)    Can take the coated 325 mg. Plain asa 325 mg speeds up the heart.   Benadryl  [Diphenhydramine ]     Increased heart rate   Cefdinir  Diarrhea   Covid-19  Ad26 Vaccine(Janssen) Hives     hives 6 hrs after her COVID 19 2nd booster - resolved    Fish Allergy  Hives    Was imitation crab meat and broke out in hives. Believe to be from the preservatives   Hylan G-F 20 Other (See Comments)    Very painful (knee injection)   Penicillins Other (See Comments)    Pt previously has been told not to take because of family history of reactions (water  blisters). She took amoxicillin  in 2012-2013 with no reaction Pt received ancef  on 11-16-2021 without issue   Tape Other (See Comments)    Blisters from adhesive tape   Wound Dressing Adhesive Other (See Comments)    Blisters from adhesive tape    Family History  Problem Relation Age of Onset   Pancreatic cancer Brother    Lymphoma Mother    Prostate cancer Father        metastatic prostate cancer   Breast cancer Sister 86   Allergic rhinitis Sister    Urticaria Sister    Asthma Neg Hx    Eczema Neg Hx    Immunodeficiency  Neg Hx    Atopy Neg Hx    Angioedema Neg Hx     Prior to Admission medications   Medication Sig Start Date End Date Taking? Authorizing Provider  acetaminophen  (TYLENOL ) 500 MG tablet Take 1,000 mg by mouth every 6 (six) hours as needed for moderate pain (pain score 4-6).    [provider]  ALPRAZolam  (XANAX ) 0.25 MG tablet Take 1 tablet (0.25 mg total) by mouth 2 (two) times daily as needed for anxiety. 10/30/23   Plotnikov, Aleksei V, MD  benzonatate  (TESSALON ) 200 MG capsule Take 1 capsule (200 mg total) by mouth 2 (two) times daily as needed for cough. Patient not taking: Reported on 06/19/2024 04/24/24   Alvia Corean CROME, FNP  cholecalciferol  (VITAMIN D3) 25 MCG (1000 UT) tablet Take 1,000 Units by mouth daily.    [provider]  ELIQUIS  5 MG TABS tablet TAKE 1 TABLET(5 MG) BY MOUTH TWICE DAILY 05/11/24   Okey Vina [name], MD  famotidine  (PEPCID ) 20 MG tablet TAKE 1 TABLET(20 MG) BY MOUTH AT BEDTIME 01/20/24   Plotnikov, Aleksei V, MD  flecainide  (TAMBOCOR )  50 MG tablet TAKE 1 AND 1/2 TABLETS(75 MG) BY MOUTH TWICE DAILY 10/29/23   Okey Vina [name], MD  fluticasone  (FLONASE ) 50 MCG/ACT nasal spray Place 2 sprays into both nostrils daily as needed for allergies or rhinitis.    [provider]  furosemide  (LASIX ) 20 MG tablet Take 20 mg by mouth daily as needed for edema or fluid.    [provider]  HYDROcodone -acetaminophen  (NORCO/VICODIN) 5-325 MG tablet Take 1 tablet by mouth every 6 (six) hours as needed for severe pain (pain score 7-10). 02/12/24   Plotnikov, Aleksei V, MD  hydroxychloroquine  (PLAQUENIL ) 200 MG tablet Take 200 mg by mouth every morning.    [provider]  levalbuterol  (XOPENEX  HFA) 45 MCG/ACT inhaler Inhale 2 puffs into the lungs every 6 (six) hours as needed for wheezing. 01/16/24   Desai, Nikita S, MD  metoprolol  tartrate (LOPRESSOR ) 25 MG tablet Take 2 tablets (50 mg total) by mouth 2 (two) times daily. 06/02/24   Okey Vina [name], MD  potassium chloride  (KLOR-CON ) 10 MEQ tablet Take 2 tablets by mouth daily Patient taking differently: Take 20 mEq by mouth daily as needed (Takes with Lasix ). 07/19/23   Nahser, Aleene PARAS, MD  rosuvastatin  (CRESTOR ) 5 MG tablet Take 1 tablet (5 mg total) by mouth daily. 10/30/23   Plotnikov, Karlynn [name], MD  sertraline  (ZOLOFT ) 50 MG tablet TAKE 1 TABLET BY MOUTH DAILY 02/04/24   Plotnikov, Aleksei V, MD  tirzepatide  (ZEPBOUND ) 2.5 MG/0.5ML injection vial Inject 2.5 mg into the skin once a week. 05/27/24   Plotnikov, Aleksei V, MD  triamcinolone  ointment (KENALOG ) 0.1 % Apply 1 Application topically 2 (two) times daily as needed (irritation). 05/27/24   [provider]    Physical Exam: Vitals:   06/23/24 1030 06/23/24 1035 06/23/24 1040 06/23/24 1230  BP: (!) 191/100 (!) 191/100  (!) 150/78  Pulse: (!) 58 74  (!) 50  Resp: 20 18  15   Temp: 98.5 F (36.9 C) 98.5 F (36.9 C)    TempSrc: Oral Oral    SpO2: 96% 100%  97%  Weight:   107 kg   Height:   5' 5 (1.651 m)     Physical Exam Vitals and nursing note reviewed.  Constitutional:      General: She is awake. She is not in acute distress.    Appearance: She is ill-appearing.  HENT:     Head: Normocephalic.     Nose: No rhinorrhea.     Mouth/Throat:     Mouth: Mucous membranes are moist.  Eyes:     General: No scleral icterus.    Pupils: Pupils are equal, round, and reactive to light.  Neck:     Vascular: No JVD.  Cardiovascular:     Rate and Rhythm: Bradycardia present. Rhythm irregularly irregular.     Heart sounds: S1 normal and S2 normal.  Pulmonary:     Effort: Pulmonary effort is normal.     Breath sounds: Normal breath sounds. No wheezing, rhonchi or rales.  Abdominal:     General: Bowel sounds are normal. There is no distension.     Palpations: Abdomen is soft.     Tenderness: There is no right CVA tenderness or left CVA tenderness.  Musculoskeletal:     Cervical back: Neck supple.     Right lower leg: No edema.     Left lower leg: No edema.  Skin:    General: Skin is warm and dry.  Neurological:     General: No focal deficit present.     Mental Status: She is alert and oriented to person, place, and time.  Psychiatric:        Mood and Affect: Mood normal.        Behavior: Behavior normal. Behavior is cooperative.     Data Reviewed:  Results are pending, will review when available.  Cardiac monitor, 11/15/2023 Impression:  Sinus rytm  Rate 46 to 102 bpm     Average HR 61 bpm    Rare PAC, rare PVC. Bursts of SVT, longest and fastest was 12 min 21 sec at 138 bpm maximal rate (average 97 bpm)    Triggered events correlated with sinus rhythm and sinus rhythm with PACs plus  SVT.   Echocardiogram, 09/25/2023 Left ventricular ejection fraction, by estimation, is 60 to 65% . The left ventricle has normal function. The left ventricle has no regional wall motion abnormalities. Diastolic dysfunction is not assessed due suboptimal images - no tissue doppler.  Right ventricular  systolic function grossly normal. The right ventricular size is grossly normal. There is normal pulmonary artery systolic pressure.  The mitral valve was not well visualized. Trivial mitral valve regurgitation. No evidence of mitral stenosis.  The aortic valve was not well visualized.  The inferior vena cava is normal in size with greater than 50% respiratory variability, suggesting right atrial pressure of 3 mmHg.   Cardiopulmonary exercise test, 05/31/2020 Interpretation  Notes: Patient gave a very good effort. Pulse-oximetry remained 98-99% for the duration of exercise. Exercise was performed on a cycle ergometer starting at [facility] and increasing by 10W/min.  ECG:  Resting ECG in sinus rhythm. HR response blunted (consider beta-blockade). There were no sustained arrhythmias or ST-T changes. BP response hypertensive at peak exercise.   PFT:  Pre-exercise spirometry was within normal limits. The MVV was normal.  CPX:  Exercise testing with gas exchange demonstrates a normal peak VO2 of 12.9 ml/kg/min (101% of the age/gender/weight matched sedentary norms). The RER of 1.11 indicates a maximal effort. When adjusted to the patient's ideal body weight of 142.2 lb (64.5 kg) the peak VO2 is 21.4 ml/kg (ibw)/min (124% of the ibw-adjusted predicted). The VE/VCO2 slope is moderately elevated and indicates increased dead space ventilation. The oxygen  uptake efficiency slope (OUES) is normal. The VO2 at the ventilatory threshold was normal at 95% of the predicted peak VO2. At  peak exercise, the ventilation reached 68% of the measured MVV indicating ventilatory reserve remained. The O2pulse (a surrogate for stroke volume) increase with incremental exercise, reaching peak at 22ml/beat (122% predicted).  Conclusion: Exercise testing with gas exchange demonstrates normal functional capacity when compared to matched sedentary norms. There is no clear indication for cardiopulmonary abnormality. Patient's body habitus appears   Primary role in her exercise limitation. VE/VCO2 slope is elevated and could be indicative of increased pulmonary pressures, however appears more likely hyperventilation with low PETCO2. Patient was advised, if cardiology cleared, to begin 3x 10 min bouts of cardio exercise throughout her day to help improve dyspnea with activity.   EKG: Vent. rate 70 BPM PR interval * ms QRS duration 110 ms QT/QTcB 396/428 ms P-R-T axes * 50 67 Atrial fibrillation Low voltage, extremity leads  Assessment and Plan: Principal Problem:   Junctional rhythm Superimposed on:   PAT (paroxysmal atrial tachycardia) (HCC)   PAF (paroxysmal atrial fibrillation) (HCC) Suspicious for:   Tachycardia-bradycardia syndrome (HCC) CHA?DS?-VASc Score of at least 6. (Age, gender, HF, HTN and vascular disease). Observation/PCU at [facility]. Hold flecainide . Hold metoprolol .   Continue apixaban  5 mg p.o. twice daily. Keep electrolytes optimized. Check transthoracic echocardiogram. Cardiology consult appreciated. - Will transfer to [facility] for EP evaluation.  Active Problems:   Chronic heart failure with preserved ejection fraction (HFpEF) (HCC) No signs of volume overload. Hold diuretic and beta-blocker for now.    Hyponatremia Free water  intake restriction. Hold furosemide . Check urine sodium. Time-limited normal saline infusion. Follow-up sodium level in AM.    OSA on CPAP Continue CPAP at bedtime.    Primary hypertension Hold beta-blocker for now. Hydralazine  10 mg p.o. 3 times daily.    RA (rheumatoid arthritis) (HCC)  Continue hydroxychloroquine  200 mg p.o. daily.    HLD (hyperlipidemia) Continue rosuvastatin  5 mg p.o. daily.    Mixed anxiety and depressive disorder Continue sertraline  50 mg p.o. daily. Continue alprazolam  0.25 mg p.o. twice daily as needed.     Advance Care Planning:   Code Status: Full Code   Consults: Cardiology Earley Bihari, MD).  Family Communication:   Severity of  Illness: The appropriate patient status for this patient is OBSERVATION. Observation status is judged to be reasonable and necessary in order to provide the required intensity of service to ensure the patient's safety. The patient's presenting symptoms, physical exam findings, and initial radiographic and laboratory data in the context of their medical condition is felt to place them at decreased risk for further clinical deterioration. Furthermore, it is anticipated that the patient will be medically stable for discharge from the hospital within 2 midnights of admission.   Author: Alm Dorn Castor, MD 06/23/2024 12:53 PM  For on call review [email or web address].   This document was prepared using Dragon voice recognition software and may contain some unintended transcription errors.

## 2024-06-23 NOTE — Progress Notes (Signed)
 Pt. Arrived to PST stating I don't feel well. I have a weird feeling in my chest. I feel like I am going to pass out. Vitals taken.  T-98.1, HR 73, B/P 188/107, RR 20,  O2 sat 100%. Skin warm and dry. Pt. Did eat breakfast this am. She stated she wants to go to ER. Pt. Taken to ER via wheelchair. Assisted her to a stretcher in Room 4. The above reported to [employer].

## 2024-06-23 NOTE — ED Triage Notes (Signed)
 Pt brought from Pre op assessment, scheduled for knee surgery next week. Pt has A Fib, onset of feeling different in chest with near syncope coming out of bathroom. Pt denies actual chest pain, calls it a sensation located mid chest. Pt states HR in generally in 70s

## 2024-06-24 ENCOUNTER — Ambulatory Visit: Admitting: Internal Medicine

## 2024-06-24 ENCOUNTER — Observation Stay (HOSPITAL_COMMUNITY)

## 2024-06-24 DIAGNOSIS — I495 Sick sinus syndrome: Secondary | ICD-10-CM | POA: Diagnosis present

## 2024-06-24 DIAGNOSIS — I5032 Chronic diastolic (congestive) heart failure: Secondary | ICD-10-CM | POA: Diagnosis present

## 2024-06-24 DIAGNOSIS — G4733 Obstructive sleep apnea (adult) (pediatric): Secondary | ICD-10-CM | POA: Diagnosis present

## 2024-06-24 DIAGNOSIS — I471 Supraventricular tachycardia, unspecified: Secondary | ICD-10-CM

## 2024-06-24 DIAGNOSIS — F418 Other specified anxiety disorders: Secondary | ICD-10-CM

## 2024-06-24 DIAGNOSIS — I480 Paroxysmal atrial fibrillation: Secondary | ICD-10-CM | POA: Diagnosis present

## 2024-06-24 DIAGNOSIS — M06071 Rheumatoid arthritis without rheumatoid factor, right ankle and foot: Secondary | ICD-10-CM

## 2024-06-24 DIAGNOSIS — R001 Bradycardia, unspecified: Principal | ICD-10-CM

## 2024-06-24 DIAGNOSIS — Z881 Allergy status to other antibiotic agents status: Secondary | ICD-10-CM | POA: Diagnosis not present

## 2024-06-24 DIAGNOSIS — E871 Hypo-osmolality and hyponatremia: Secondary | ICD-10-CM | POA: Diagnosis present

## 2024-06-24 DIAGNOSIS — I110 Hypertensive heart disease with heart failure: Secondary | ICD-10-CM | POA: Diagnosis present

## 2024-06-24 DIAGNOSIS — Z7901 Long term (current) use of anticoagulants: Secondary | ICD-10-CM | POA: Diagnosis not present

## 2024-06-24 DIAGNOSIS — M069 Rheumatoid arthritis, unspecified: Secondary | ICD-10-CM | POA: Diagnosis present

## 2024-06-24 DIAGNOSIS — Z886 Allergy status to analgesic agent status: Secondary | ICD-10-CM | POA: Diagnosis not present

## 2024-06-24 DIAGNOSIS — I2510 Atherosclerotic heart disease of native coronary artery without angina pectoris: Secondary | ICD-10-CM | POA: Diagnosis present

## 2024-06-24 DIAGNOSIS — Z96651 Presence of right artificial knee joint: Secondary | ICD-10-CM | POA: Diagnosis present

## 2024-06-24 DIAGNOSIS — G43909 Migraine, unspecified, not intractable, without status migrainosus: Secondary | ICD-10-CM | POA: Diagnosis present

## 2024-06-24 DIAGNOSIS — I10 Essential (primary) hypertension: Secondary | ICD-10-CM

## 2024-06-24 DIAGNOSIS — I700 Atherosclerosis of aorta: Secondary | ICD-10-CM | POA: Diagnosis present

## 2024-06-24 DIAGNOSIS — Z79899 Other long term (current) drug therapy: Secondary | ICD-10-CM | POA: Diagnosis not present

## 2024-06-24 DIAGNOSIS — I4892 Unspecified atrial flutter: Secondary | ICD-10-CM | POA: Diagnosis present

## 2024-06-24 DIAGNOSIS — M06072 Rheumatoid arthritis without rheumatoid factor, left ankle and foot: Secondary | ICD-10-CM | POA: Diagnosis not present

## 2024-06-24 DIAGNOSIS — Z7985 Long-term (current) use of injectable non-insulin antidiabetic drugs: Secondary | ICD-10-CM | POA: Diagnosis not present

## 2024-06-24 DIAGNOSIS — R55 Syncope and collapse: Secondary | ICD-10-CM | POA: Diagnosis present

## 2024-06-24 DIAGNOSIS — I4719 Other supraventricular tachycardia: Secondary | ICD-10-CM | POA: Diagnosis present

## 2024-06-24 DIAGNOSIS — E785 Hyperlipidemia, unspecified: Secondary | ICD-10-CM | POA: Diagnosis present

## 2024-06-24 DIAGNOSIS — R000 Tachycardia, unspecified: Secondary | ICD-10-CM | POA: Diagnosis not present

## 2024-06-24 DIAGNOSIS — F32A Depression, unspecified: Secondary | ICD-10-CM | POA: Diagnosis present

## 2024-06-24 DIAGNOSIS — R9431 Abnormal electrocardiogram [ECG] [EKG]: Secondary | ICD-10-CM | POA: Diagnosis not present

## 2024-06-24 DIAGNOSIS — E66812 Obesity, class 2: Secondary | ICD-10-CM | POA: Diagnosis present

## 2024-06-24 DIAGNOSIS — Z96641 Presence of right artificial hip joint: Secondary | ICD-10-CM | POA: Diagnosis present

## 2024-06-24 DIAGNOSIS — I498 Other specified cardiac arrhythmias: Secondary | ICD-10-CM | POA: Diagnosis not present

## 2024-06-24 LAB — BASIC METABOLIC PANEL WITH GFR
Anion gap: 12 (ref 5–15)
BUN: 11 mg/dL (ref 8–23)
CO2: 19 mmol/L — ABNORMAL LOW (ref 22–32)
Calcium: 9.3 mg/dL (ref 8.9–10.3)
Chloride: 98 mmol/L (ref 98–111)
Creatinine, Ser: 0.65 mg/dL (ref 0.44–1.00)
GFR, Estimated: >60 mL/min (ref >60)
Glucose, Bld: 95 mg/dL (ref 70–99)
Potassium: 4.6 mmol/L (ref 3.5–5.1)
Sodium: 129 mmol/L — ABNORMAL LOW (ref 135–145)

## 2024-06-24 LAB — COMPREHENSIVE METABOLIC PANEL WITH GFR
ALT: 31 U/L (ref 0–44)
AST: 30 U/L (ref 15–41)
Albumin: 4.2 g/dL (ref 3.5–5.0)
Alkaline Phosphatase: 72 U/L (ref 38–126)
Anion gap: 12 (ref 5–15)
BUN: 9 mg/dL (ref 8–23)
CO2: 19 mmol/L — ABNORMAL LOW (ref 22–32)
Calcium: 9.2 mg/dL (ref 8.9–10.3)
Chloride: 95 mmol/L — ABNORMAL LOW (ref 98–111)
Creatinine, Ser: 0.63 mg/dL (ref 0.44–1.00)
GFR, Estimated: >60 mL/min (ref >60)
Glucose, Bld: 91 mg/dL (ref 70–99)
Potassium: 4.2 mmol/L (ref 3.5–5.1)
Sodium: 126 mmol/L — ABNORMAL LOW (ref 135–145)
Total Bilirubin: 1 mg/dL (ref 0.0–1.2)
Total Protein: 6.7 g/dL (ref 6.5–8.1)

## 2024-06-24 LAB — CBC
HCT: 36.3 % (ref 36.0–46.0)
Hemoglobin: 12.4 g/dL (ref 12.0–15.0)
MCH: 33 pg (ref 26.0–34.0)
MCHC: 34.2 g/dL (ref 30.0–36.0)
MCV: 96.5 fL (ref 80.0–100.0)
Platelets: 155 K/uL (ref 150–400)
RBC: 3.76 MIL/uL — ABNORMAL LOW (ref 3.87–5.11)
RDW: 12.4 % (ref 11.5–15.5)
WBC: 6.3 K/uL (ref 4.0–10.5)
nRBC: 0 % (ref 0.0–0.2)

## 2024-06-24 LAB — UREA NITROGEN, URINE: Urea Nitrogen, Ur: 443 mg/dL

## 2024-06-24 LAB — MRSA NEXT GEN BY PCR, NASAL: MRSA by PCR Next Gen: NOT DETECTED

## 2024-06-24 MED ORDER — HYDROCODONE-ACETAMINOPHEN 5-325 MG PO TABS: 1.0000 | ORAL_TABLET | Freq: Four times a day (QID) | ORAL | Status: DC | PRN

## 2024-06-24 MED ORDER — VITAMIN D 25 MCG (1000 UNIT) PO TABS
1000.0000 [IU] | ORAL_TABLET | Freq: Every day | ORAL | Status: DC
  Administered 2024-06-24 – 2024-06-26 (×3): 1000 [IU] via ORAL
  Filled 2024-06-24 (×3): qty 1

## 2024-06-24 MED ORDER — LEVALBUTEROL TARTRATE 45 MCG/ACT IN AERO: 2.0000 | INHALATION_SPRAY | Freq: Four times a day (QID) | RESPIRATORY_TRACT | Status: DC | PRN

## 2024-06-24 MED ORDER — SODIUM CHLORIDE 0.9 % IV SOLN: INTRAVENOUS | Status: AC

## 2024-06-24 MED ORDER — LORATADINE 10 MG PO TABS: 10.0000 mg | ORAL_TABLET | Freq: Every day | ORAL | Status: DC | PRN

## 2024-06-24 MED ORDER — LEVALBUTEROL HCL 0.63 MG/3ML IN NEBU: 0.6300 mg | INHALATION_SOLUTION | Freq: Four times a day (QID) | RESPIRATORY_TRACT | Status: DC | PRN

## 2024-06-24 MED ORDER — FLUTICASONE PROPIONATE 50 MCG/ACT NA SUSP: 2.0000 | Freq: Every day | NASAL | Status: DC | PRN

## 2024-06-24 NOTE — Plan of Care (Signed)
   Problem: Clinical Measurements: Goal: Diagnostic test results will improve Outcome: Progressing   Problem: Activity: Goal: Risk for activity intolerance will decrease Outcome: Progressing   Problem: Coping: Goal: Level of anxiety will decrease Outcome: Progressing   Problem: Safety: Goal: Ability to remain free from injury will improve Outcome: Progressing
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
## 2024-06-24 NOTE — Care Management Obs Status (Signed)
 MEDICARE OBSERVATION STATUS NOTIFICATION   Patient Details  Name: Ceanna Wareing MRN: [id number] Date of Birth: 1946/05/24   Medicare Observation Status Notification Given:  Yes    Jon ONEIDA Anon, RN 06/24/2024, 2:03 PM

## 2024-06-24 NOTE — Progress Notes (Signed)
Placed patient on home CPAP for the night  

## 2024-06-24 NOTE — Progress Notes (Signed)
 PROGRESS NOTE    Amanda Wilkins  FMW:[id number] DOB: December 30, 1945 DOA: 06/23/2024 PCP: Garald Karlynn [name], MD    No chief complaint on file.   Brief Narrative:  Patient pleasant 78 year old female history of chronic interstitial nephritis, bronchitis, AKI, prior history of septic shock due to cellulitis, cholecystitis, depression, GERD, hypertension, hyponatremia, migraine headaches, OSA on CPAP, PSVT, paroxysmal A-fib on anticoagulation with history of cardioversion presented to the ED with lightheadedness and dizziness and presyncopal episode while getting preop lab work done.  Initial EKG noted patient to be in A-fib however patient also noted to develop a junctional bradycardia with heart rates of 38 bpm.  Patient seen by cardiology who recommended transfer to [facility] for evaluation by EP.  Patient currently awaiting transfer.   Assessment & Plan:   Principal Problem:   Junctional rhythm Active Problems:   Hyponatremia   OSA on CPAP   Primary hypertension   RA (rheumatoid arthritis) (HCC)   HLD (hyperlipidemia)   PAF (paroxysmal atrial fibrillation) (HCC)   Mixed anxiety and depressive disorder   Tachycardia-bradycardia syndrome (HCC)   PAT (paroxysmal atrial tachycardia) (HCC)   Chronic heart failure with preserved ejection fraction (HFpEF) (HCC)  #1 presyncope/paroxysmal atrial fibrillation/atrial flutter/junctional bradycardia/tachybradycardia syndrome - Patient noted to have presented with presyncopal episode of lightheadedness and dizziness, noted to initially be in A-fib/atrial flutter and subsequently junctional bradycardia with heart rates in the 30s. - Patient with no signs or symptoms of infection. - TSH noted at 4.090. - 2D echo pending. - Patient seen in consultation by cardiology who feel patient likely has a tachybradycardia syndrome. - Flecainide  and beta-blocker currently on hold. - Potassium of 4.2 this morning, magnesium  low at 2.3. - Sodium  at 126 this morning from 122 on admission. - Awaiting transfer to [facility] for evaluation by EP. - Cardiology following appreciate input and recommendations.  2.  Hyponatremia -??  Etiology. - Concern for prerenal azotemia. - Patient not volume overloaded on examination. - TSH within normal limits at 4.090. - Urine sodium <30. - Urine osmolality 341. - Patient placed on gentle hydration on admission and diuretics held with improvement with sodium currently at 126 from 122 on admission. - Gentle hydration for another 24 hours. - Hold diuretics. - Follow-up.  3.  Hypertension -Beta-blocker on hold. - Continue hydralazine  10 mg p.o. 3 times daily.  4.  Chronic heart failure with preserved ejection fraction/HFpEF - Patient not volume overloaded on examination. - Continue to hold diuretics and beta-blocker. - Per cardiology.  5.  OSA -CPAP nightly.  6.  Rheumatoid arthritis -Continue home regimen hydroxychloroquine .  7.  Hyperlipidemia -Continue statin.  8.  Mixed anxiety and depressive disorder -Continue home regimen sertraline  50 mg daily, alprazolam  0.25 mg twice daily as needed.   DVT prophylaxis: Eliquis  Code Status: Full Family Communication: Updated patient.  Husband asleep at bedside. Disposition: Awaiting transfer to [facility] for evaluation by EP/cardiology.  Status is: Observation The patient remains OBS appropriate and will d/c before 2 midnights.   Consultants:  Cardiology: Dr. Shlomo 06/23/2024  Procedures:  Chest x-ray 06/23/2024 2D echo pending  Antimicrobials:  Anti-infectives (From admission, onward)    Start     Dose/Rate Route Frequency Ordered Stop   06/24/24 1015  hydroxychloroquine  (PLAQUENIL ) tablet 200 mg        200 mg Oral Daily 06/23/24 1826           Subjective: Patient sitting up in bed.  States feels a little  bit better than she did on admission.  Stated was able to ambulate to the bathroom this morning without  any significant dizziness or lightheadedness or presyncopal episodes.  Denies any chest pain.  No abdominal pain.  Anxiously awaiting transfer to [facility]  Objective: Vitals:   06/24/24 0600 06/24/24 0800 06/24/24 0852 06/24/24 0900  BP: (!) 120/46  (!) 159/103   Pulse: 62 63  72  Resp: 19 14  15   Temp:  97.7 F (36.5 C)    TempSrc:  Oral    SpO2: 95% 100%  91%  Weight:      Height:        Intake/Output Summary (Last 24 hours) at 06/24/2024 1058 Last data filed at 06/24/2024 0500 Gross per 24 hour  Intake 1039.71 ml  Output --  Net 1039.71 ml   Filed Weights   06/23/24 1040 06/23/24 1416  Weight: 107 kg 108.5 kg    Examination:  General exam: Appears calm and comfortable  Respiratory system: Clear to auscultation.  No wheezes, no crackles, no rhonchi.  Fair air movement.  Speaking in full sentences.  Respiratory effort normal. Cardiovascular system: S1 & S2 heard, RRR. No JVD, murmurs, rubs, gallops or clicks. No pedal edema. Gastrointestinal system: Abdomen is nondistended, soft and nontender. No organomegaly or masses felt. Normal bowel sounds heard. Central nervous system: Alert and oriented. No focal neurological deficits. Extremities: Symmetric 5 x 5 power. Skin: No rashes, lesions or ulcers Psychiatry: Judgement and insight appear normal. Mood & affect appropriate.     Data Reviewed: I have personally reviewed following labs and imaging studies  CBC: Recent Labs  Lab 06/23/24 1055 06/24/24 0328  WBC 6.4 6.3  HGB 15.2* 12.4  HCT 44.4 36.3  MCV 94.7 96.5  PLT 201 155    Basic Metabolic Panel: Recent Labs  Lab 06/23/24 1055 06/23/24 1458 06/24/24 0328  NA 122* 123* 126*  K 5.2* 4.7 4.2  CL 87* 89* 95*  CO2 19* 19* 19*  GLUCOSE 101* 104* 91  BUN 11 10 9   CREATININE 0.84 0.74 0.63  CALCIUM  10.3 9.8 9.2  MG 2.3  --   --   PHOS  --  2.8  --     GFR: Estimated Creatinine Clearance: 72.1 mL/min (by C-G formula based on SCr of 0.63  mg/dL).  Liver Function Tests: Recent Labs  Lab 06/23/24 1458 06/24/24 0328  AST 39 30  ALT 38 31  ALKPHOS 82 72  BILITOT 0.9 1.0  PROT 7.2 6.7  ALBUMIN  4.5 4.2    CBG: No results for input(s): GLUCAP in the last 168 hours.   Recent Results (from the past 240 hours)  Surgical pcr screen     Status: None   Collection Time: 06/23/24  2:14 PM   Specimen: Nasal Mucosa; Nasal Swab  Result Value Ref Range Status   MRSA, PCR NEGATIVE NEGATIVE Final   Staphylococcus aureus NEGATIVE NEGATIVE Final    Comment: (NOTE) The Xpert SA Assay (FDA approved for NASAL specimens in patients 72 years of age and older), is one component of a comprehensive surveillance program. It is not intended to diagnose infection nor to guide or monitor treatment. Performed at [facility], [address], [place], [place] [address]          Radiology Studies: [facility] Chest Port 1 View Result Date: 06/23/2024 CLINICAL DATA:  Chest pain. EXAM: PORTABLE CHEST 1 VIEW COMPARISON:  04/24/2024. FINDINGS: The heart size and mediastinal contours are  unchanged. Defibrillator pad overlies the left chest wall. Aortic atherosclerosis. No focal consolidation, pleural effusion, or pneumothorax. No acute osseous abnormality. IMPRESSION: No acute cardiopulmonary findings. Electronically Signed   By: Harrietta Sherry M.D.   On: 06/23/2024 11:39        Scheduled Meds:  apixaban   5 mg Oral BID   Chlorhexidine  Gluconate Cloth  6 each Topical Daily   cholecalciferol   1,000 Units Oral Daily   famotidine   20 mg Oral QHS   hydrALAZINE   10 mg Oral Q8H   hydroxychloroquine   200 mg Oral Daily   rosuvastatin   5 mg Oral Daily   sertraline   50 mg Oral Daily   Continuous Infusions:  sodium chloride  100 mL/hr at 06/24/24 0848     LOS: 0 days    Time spent: 45 mins    Toribio Hummer, MD [facility]   To contact the attending provider between 7A-7P or the covering provider during after  hours 7P-7A, please log into the web site [email or web address] and access using universal [place] password for that web site. If you do not have the password, please call the hospital operator.  06/24/2024, 10:58 AM

## 2024-06-24 NOTE — Progress Notes (Addendum)
  Progress Note  Patient Name: Amanda Wilkins Date of Encounter: 06/24/2024 [facility] Cardiologist: Vina Gull, MD   Interval Summary   Patient resting in bed, husband present in room Reports no real symptoms, felt a little funny last night HR did drop to 20-30s over night  Still awaiting echocardiogram and transfer to [facility] She did admit me to that she might have taken a few extra doses of Lasix  prior to her surgery because she knew she needed to drop a few pounds prior   Vital Signs Vitals:   06/24/24 0600 06/24/24 0800 06/24/24 0852 06/24/24 0900  BP: (!) 120/46  (!) 159/103   Pulse: 62 63  72  Resp: 19 14  15   Temp:  97.7 F (36.5 C)    TempSrc:  Oral    SpO2: 95% 100%  91%  Weight:      Height:        Intake/Output Summary (Last 24 hours) at 06/24/2024 1112 Last data filed at 06/24/2024 0500 Gross per 24 hour  Intake 1039.71 ml  Output --  Net 1039.71 ml      06/23/2024    2:16 PM 06/23/2024   10:40 AM 04/30/2024   11:07 AM  Last 3 Weights  Weight (lbs) 239 lb 3.2 oz 236 lb 252 lb  Weight (kg) 108.5 kg 107.049 kg 114.306 kg     Telemetry/ECG  Sinus rhythm, HR 60-70s, dropped to 20-30s over night with frequent 2-3 second pauses  - Personally Reviewed  Physical Exam  GEN: No acute distress.   Neck: No JVD Cardiac: RRR, no murmurs, rubs, or gallops.  Respiratory: Clear to auscultation bilaterally. GI: Soft, nontender, non-distended  MS: No edema  Assessment & Plan  Junctional rhythm Bradycardia Presyncope  She presented to hospital for preoperative assessment Reported feeling strange sensation in her chest, near syncope EKG showed junctional rhythm, HR 38 Initial troponin negative Patient denies any additional doses of home medications On the room patient denies any chest pain, currently asymptomatic Continue to monitor on telemetry Awaiting transfer to [facility] to be evaluated by our EP team    Paroxysmal atrial fibrillation/flutter History  of SVT Known history of atrial flutter/fibrillation Long-term monitor from January 2025 showed no atrial fibrillation, SVT, rare ectopy Home meds: Flecainide  75 mg BID, Lopressor  50 mg BID, Eliquis  5 mg BID Initial EKG in preop assessment unit noted to be atrial fibrillation, HR 70s TSH  normal  Continue to monitor on telemetry Monitoring updated echocardiogram Repletion of electrolytes per primary Awaiting transfer to [facility] to be evaluated by our EP team    Chronic HFpEF Chronic lower extremity edema Chronic shortness of breath Echo from 09/2023 showed: EF 60 to 65%, normal RV function, normal IVC Home meds: As needed Lasix  Ordering updated echocardiogram   Hyperlipidemia Home meds: Crestor  5 mg daily Continue home statin   Hypertension Hypertensive on admission, initial BP 191/100 Home meds: Lopressor  50 mg bid Previously known to be intolerant amlodipine  Currently on hydralazine  10 mg TID    Aortic atherosclerosis Chest imaging noted aortic atherosclerosis No active chest pain Initial troponin negative Continue blood pressure and cholesterol management as above   Per primary Hyponatremia OSA on CPAP   For questions or updates, please contact [facility] Please consult [email or web address] for contact info under       Signed, Waddell DELENA Donath, PA-C

## 2024-06-25 ENCOUNTER — Telehealth: Payer: Self-pay | Admitting: *Deleted

## 2024-06-25 ENCOUNTER — Inpatient Hospital Stay (HOSPITAL_COMMUNITY)

## 2024-06-25 DIAGNOSIS — R9431 Abnormal electrocardiogram [ECG] [EKG]: Secondary | ICD-10-CM | POA: Diagnosis not present

## 2024-06-25 DIAGNOSIS — I498 Other specified cardiac arrhythmias: Secondary | ICD-10-CM | POA: Diagnosis not present

## 2024-06-25 DIAGNOSIS — I480 Paroxysmal atrial fibrillation: Secondary | ICD-10-CM | POA: Diagnosis not present

## 2024-06-25 DIAGNOSIS — R001 Bradycardia, unspecified: Secondary | ICD-10-CM | POA: Diagnosis not present

## 2024-06-25 DIAGNOSIS — I10 Essential (primary) hypertension: Secondary | ICD-10-CM | POA: Diagnosis not present

## 2024-06-25 DIAGNOSIS — R000 Tachycardia, unspecified: Secondary | ICD-10-CM

## 2024-06-25 DIAGNOSIS — I5032 Chronic diastolic (congestive) heart failure: Secondary | ICD-10-CM | POA: Diagnosis not present

## 2024-06-25 LAB — RENAL FUNCTION PANEL
Albumin: 3.7 g/dL (ref 3.5–5.0)
Anion gap: 12 (ref 5–15)
BUN: 7 mg/dL — ABNORMAL LOW (ref 8–23)
CO2: 20 mmol/L — ABNORMAL LOW (ref 22–32)
Calcium: 9.2 mg/dL (ref 8.9–10.3)
Chloride: 99 mmol/L (ref 98–111)
Creatinine, Ser: 0.72 mg/dL (ref 0.44–1.00)
GFR, Estimated: >60 mL/min (ref >60)
Glucose, Bld: 84 mg/dL (ref 70–99)
Phosphorus: 3.7 mg/dL (ref 2.5–4.6)
Potassium: 4 mmol/L (ref 3.5–5.1)
Sodium: 131 mmol/L — ABNORMAL LOW (ref 135–145)

## 2024-06-25 LAB — BASIC METABOLIC PANEL WITH GFR
Anion gap: 12 (ref 5–15)
BUN: 8 mg/dL (ref 8–23)
CO2: 19 mmol/L — ABNORMAL LOW (ref 22–32)
Calcium: 9.5 mg/dL (ref 8.9–10.3)
Chloride: 99 mmol/L (ref 98–111)
Creatinine, Ser: 0.77 mg/dL (ref 0.44–1.00)
GFR, Estimated: >60 mL/min (ref >60)
Glucose, Bld: 101 mg/dL — ABNORMAL HIGH (ref 70–99)
Potassium: 4 mmol/L (ref 3.5–5.1)
Sodium: 130 mmol/L — ABNORMAL LOW (ref 135–145)

## 2024-06-25 LAB — ECHOCARDIOGRAM COMPLETE
AR max vel: 2.44 cm2
AV Area VTI: 2.38 cm2
AV Area mean vel: 2.2 cm2
AV Mean grad: 6 mmHg
AV Peak grad: 8.9 mmHg
Ao pk vel: 1.49 m/s
Area-P 1/2: 3.26 cm2
Height: 65 in
S' Lateral: 2.76 cm
Weight: 3816.6 [oz_av]

## 2024-06-25 LAB — MAGNESIUM
Magnesium: 2 mg/dL (ref 1.7–2.4)
Magnesium: 2.1 mg/dL (ref 1.7–2.4)

## 2024-06-25 LAB — GLUCOSE, CAPILLARY: Glucose-Capillary: 81 mg/dL (ref 70–99)

## 2024-06-25 MED ORDER — GUAIFENESIN 100 MG/5ML PO LIQD: 5.0000 mL | ORAL | Status: DC | PRN

## 2024-06-25 MED ORDER — DRONEDARONE HCL 400 MG PO TABS
400.0000 mg | ORAL_TABLET | Freq: Two times a day (BID) | ORAL | Status: DC
  Administered 2024-06-25 – 2024-06-26 (×2): 400 mg via ORAL
  Filled 2024-06-25 (×2): qty 1

## 2024-06-25 MED ORDER — HYDRALAZINE HCL 20 MG/ML IJ SOLN: 10.0000 mg | INTRAMUSCULAR | Status: DC | PRN

## 2024-06-25 NOTE — Progress Notes (Signed)
 Mobility Specialist Progress Note;   06/25/24 0840  Mobility  Activity Ambulated with assistance  Level of Assistance Standby assist, set-up cues, supervision of patient - no hands on  Assistive Device Other (Comment) (IV pole)  Distance Ambulated (ft) 300 ft  Activity Response Tolerated well  Mobility Referral Yes  Mobility visit 1 Mobility  Mobility Specialist Start Time (ACUTE ONLY) 0840  Mobility Specialist Stop Time (ACUTE ONLY) 0850  Mobility Specialist Time Calculation (min) (ACUTE ONLY) 10 min   Pt pleasant and agreeable to mobility. Required no physical assistance during ambulation, SV for safety. VSS throughout and no c/o when asked. Pt returned back to bed and left with all needs met. Requested mobility to come back later again.   Lauraine Erm Mobility Specialist Please contact via SecureChat or [employer] [phone]

## 2024-06-25 NOTE — Plan of Care (Signed)
   Problem: Education: Goal: Knowledge of General Education information will improve Description: Including pain rating scale, medication(s)/side effects and non-pharmacologic comfort measures Outcome: Progressing   Problem: Clinical Measurements: Goal: Ability to maintain clinical measurements within normal limits will improve Outcome: Progressing Goal: Cardiovascular complication will be avoided Outcome: Progressing    Problem: Activity: Goal: Risk for activity intolerance will decrease Outcome: Progressing   Problem: Nutrition: Goal: Adequate nutrition will be maintained Outcome: Progressing   Problem: Coping: Goal: Level of anxiety will decrease Outcome: Progressing

## 2024-06-25 NOTE — Telephone Encounter (Signed)
 Per chart review, pt was cleared for procedure, but now hospitalized. Will defer to follow up appt after discharge.

## 2024-06-25 NOTE — Progress Notes (Signed)
 PROGRESS NOTE    Amanda Wilkins  FMW:[id number] DOB: September 23, 1946 DOA: 06/23/2024 PCP: Garald Karlynn [name], MD    Brief Narrative:   78 year old female history of chronic interstitial nephritis, bronchitis, AKI, prior history of septic shock due to cellulitis, cholecystitis, depression, GERD, hypertension, hyponatremia, migraine headaches, OSA on CPAP, PSVT, paroxysmal A-fib on anticoagulation with history of cardioversion presented to the ED with lightheadedness and dizziness and presyncopal episode while getting preop lab work done. Initial EKG noted patient to be in A-fib however patient also noted to develop a junctional bradycardia with heart rates of 38 bpm. Patient seen by cardiology who recommended transfer to [facility] for evaluation by EP.   Assessment & Plan:  Presyncope Paroxysmal atrial fibrillation/flutter/atrial tachycardia Junctional bradycardia -Previously patient has been on flecainide , Lopressor  and Eliquis  presented for preop knee surgery.  Seen by cardiology team.  Currently flecainide  and beta-blocker has been stopped.  Optimize electrolytes.  TSH is normal. -Echocardiogram - EP evaluation   Hyponatremia -Suspected from hypovolemia improved with fluids.   Hypertension -Beta-blockers are on hold.  Currently on hydralazine  3 times daily, will adjust as necessary.   Chronic heart failure with preserved ejection fraction/HFpEF -Appears to be euvolemic.  Currently home medications/diuretics are on hold due due to hyponatremia   OSA -CPAP nightly.   Rheumatoid arthritis -Continue home regimen hydroxychloroquine .   Hyperlipidemia -Continue statin.    Mixed anxiety and depressive disorder -Continue home regimen sertraline  50 mg daily, alprazolam  0.25 mg twice daily as needed.     DVT prophylaxis: Eliquis  Code Status: Full Family Communication:  Disposition: Pending EP eval.      Consultants:  Cardiology   Procedures:  Chest x-ray  06/23/2024 2D echo pending   PT Follow up Recs:   Subjective: Seen at bedside no complaints besides 1 oral sore and recently operated dental site   Examination:  General exam: Appears calm and comfortable  Respiratory system: Clear to auscultation. Respiratory effort normal. Cardiovascular system: S1 & S2 heard, RRR. No JVD, murmurs, rubs, gallops or clicks. No pedal edema. Gastrointestinal system: Abdomen is nondistended, soft and nontender. No organomegaly or masses felt. Normal bowel sounds heard. Central nervous system: Alert and oriented. No focal neurological deficits. Extremities: Symmetric 5 x 5 power. Skin: No rashes, lesions or ulcers Psychiatry: Judgement and insight appear normal. Mood & affect appropriate.                Diet Orders (From admission, onward)     Start     Ordered   06/23/24 1811  Diet Heart Room service appropriate? Yes; Fluid consistency: Thin; Fluid restriction: 1200 mL Fluid  Diet effective now       Question Answer Comment  Room service appropriate? Yes   Fluid consistency: Thin   Fluid restriction: 1200 mL Fluid      06/23/24 1810            Objective: Vitals:   06/25/24 0258 06/25/24 0300 06/25/24 0623 06/25/24 0719  BP: 98/66  (!) 173/66 (!) 148/75  Pulse: 77 72  78  Resp: (!) 24 17  16   Temp: 97.6 F (36.4 C)   97.7 F (36.5 C)  TempSrc: Axillary   Oral  SpO2: 100% 100%  100%  Weight:      Height:        Intake/Output Summary (Last 24 hours) at 06/25/2024 0945 Last data filed at 06/24/2024 1217 Gross per 24 hour  Intake 474.42 ml  Output --  Net 474.42 ml  Filed Weights   06/23/24 1040 06/23/24 1416 06/24/24 1746  Weight: 107 kg 108.5 kg 108.2 kg    Scheduled Meds:  apixaban   5 mg Oral BID   cholecalciferol   1,000 Units Oral Daily   famotidine   20 mg Oral QHS   hydrALAZINE   10 mg Oral Q8H   hydroxychloroquine   200 mg Oral Daily   rosuvastatin   5 mg Oral Daily   sertraline   50 mg Oral Daily    Continuous Infusions:  Nutritional status     Body mass index is 39.69 kg/m.  Data Reviewed:   CBC: Recent Labs  Lab 06/23/24 1055 06/24/24 0328  WBC 6.4 6.3  HGB 15.2* 12.4  HCT 44.4 36.3  MCV 94.7 96.5  PLT 201 155   Basic Metabolic Panel: Recent Labs  Lab 06/23/24 1055 06/23/24 1458 06/24/24 0328 06/24/24 1700 06/25/24 0702  NA 122* 123* 126* 129* 131*  K 5.2* 4.7 4.2 4.6 4.0  CL 87* 89* 95* 98 99  CO2 19* 19* 19* 19* 20*  GLUCOSE 101* 104* 91 95 84  BUN 11 10 9 11  7*  CREATININE 0.84 0.74 0.63 0.65 0.72  CALCIUM  10.3 9.8 9.2 9.3 9.2  MG 2.3  --   --   --  2.0  PHOS  --  2.8  --   --  3.7   GFR: Estimated Creatinine Clearance: 72.1 mL/min (by C-G formula based on SCr of 0.72 mg/dL). Liver Function Tests: Recent Labs  Lab 06/23/24 1458 06/24/24 0328 06/25/24 0702  AST 39 30  --   ALT 38 31  --   ALKPHOS 82 72  --   BILITOT 0.9 1.0  --   PROT 7.2 6.7  --   ALBUMIN  4.5 4.2 3.7   No results for input(s): LIPASE, AMYLASE in the last 168 hours. No results for input(s): AMMONIA in the last 168 hours. Coagulation Profile: No results for input(s): INR, PROTIME in the last 168 hours. Cardiac Enzymes: No results for input(s): CKTOTAL, CKMB, CKMBINDEX, TROPONINI in the last 168 hours. BNP (last 3 results) Recent Labs    07/18/23 0904  PROBNP 577   HbA1C: No results for input(s): HGBA1C in the last 72 hours. CBG: No results for input(s): GLUCAP in the last 168 hours. Lipid Profile: No results for input(s): CHOL, HDL, LDLCALC, TRIG, CHOLHDL, LDLDIRECT in the last 72 hours. Thyroid  Function Tests: Recent Labs    06/23/24 1118  TSH 4.090   Anemia Panel: No results for input(s): VITAMINB12, FOLATE, FERRITIN, TIBC, IRON, RETICCTPCT in the last 72 hours. Sepsis Labs: No results for input(s): PROCALCITON, LATICACIDVEN in the last 168 hours.  Recent Results (from the past 240 hours)  Surgical pcr  screen     Status: None   Collection Time: 06/23/24  2:14 PM   Specimen: Nasal Mucosa; Nasal Swab  Result Value Ref Range Status   MRSA, PCR NEGATIVE NEGATIVE Final   Staphylococcus aureus NEGATIVE NEGATIVE Final    Comment: (NOTE) The Xpert SA Assay (FDA approved for NASAL specimens in patients 34 years of age and older), is one component of a comprehensive surveillance program. It is not intended to diagnose infection nor to guide or monitor treatment. Performed at [facility], [address], [place], [place] [address]   MRSA Next Gen by PCR, Nasal     Status: None   Collection Time: 06/24/24  5:51 PM   Specimen: Nasal Mucosa; Nasal Swab  Result Value Ref Range Status   MRSA  by PCR Next Gen NOT DETECTED NOT DETECTED Final    Comment: (NOTE) The GeneXpert MRSA Assay (FDA approved for NASAL specimens only), is one component of a comprehensive MRSA colonization surveillance program. It is not intended to diagnose MRSA infection nor to guide or monitor treatment for MRSA infections. Test performance is not FDA approved in patients less than 30 years old. Performed at [facility], [address] [place], [place] [address]          Radiology Studies: DG Chest Port 1 View Result Date: 06/23/2024 CLINICAL DATA:  Chest pain. EXAM: PORTABLE CHEST 1 VIEW COMPARISON:  04/24/2024. FINDINGS: The heart size and mediastinal contours are unchanged. Defibrillator pad overlies the left chest wall. Aortic atherosclerosis. No focal consolidation, pleural effusion, or pneumothorax. No acute osseous abnormality. IMPRESSION: No acute cardiopulmonary findings. Electronically Signed   By: Harrietta Sherry M.D.   On: 06/23/2024 11:39           LOS: 1 day   Time spent= 35 mins    Burgess JAYSON Dare, MD [facility]  If 7PM-7AM, please contact night-coverage  06/25/2024, 9:45 AM

## 2024-06-25 NOTE — Consult Note (Addendum)
 ELECTROPHYSIOLOGY CONSULT NOTE    Patient ID: Amanda Wilkins MRN: [id number], DOB/AGE: 1946/05/06 78 y.o.  Admit date: 06/23/2024 Date of Consult: 06/25/2024  Primary Physician: Garald Karlynn [name], MD Primary Cardiologist: Vina Gull, MD  Electrophysiologist: none   Referring Provider: @ATTENDING @  Patient Profile: Amanda Wilkins is a 78 y.o. female with a history of parox AFib, aflutter (on eliquis , flecainide  + metop), HTN, interstitial  nephritis, OSA on CPAP, HLD, mild MR, chronic edema who is being seen today for the evaluation of tachy-brady at the request of Dr. Shlomo.  HPI:  Amanda Wilkins is a 78 y.o. female with PMH as above who presented to [facility] for pre-op appt for TKA. Felt great that morning, but felt a little funny walking into the pre-op appt.   During appt, she c/o strange sensation in chest. She became dizzy with presyncope while walking. She has never had that feeling before. She was sent to [facility] ER for eval.   In ER, was noted to have junctional brady vs afib on tele and so cardiology was consulted. Home flecainide  + 50mg  lopressor  BID held. She has continued to feel off and woozy while in [facility] hospital while sitting.   Has not had woozy, off sensation since being transferred to [facility].  Last dose of flec + lopressor  Tuesday AM.   Of note, she did start zepbound  about a month ago  Currently, she feels well. Denies chest pain, chest pressure, palpitations. No episodes of wooziness since being transferred to [facility].   Husband is at bedside   Labs Potassium4.0 (09/11 0934) Magnesium   2.1 (09/11 0934) Creatinine, ser  0.77 (09/11 0934) PLT  155 (09/10 0328) HGB  12.4 (09/10 0328) WBC 6.3 (09/10 0328)  .    Past Medical History:  Diagnosis Date   Acute bronchitis 09/11/2016   11/17 refractory   Acute cystitis without hematuria 05/17/2015   Acute kidney injury [facility])    Labs today   Acute right ankle pain 09/09/2018   Anticoagulant long-term use     Xarelto    Axillary pain    Bronchitis    Cellulitis    Cholecystitis    Chronic interstitial nephritis    CONJUNCTIVITIS, ACUTE 10/18/2010   Qualifier: Diagnosis of  By: Plotnikov MD, Karlynn [name]    Coronary artery disease    Mild   D-dimer, elevated    Depression    Dysrhythmia    A. Fib/A. Flutter, AVT   Eye inflammation    GERD (gastroesophageal reflux disease)    History of interstitial nephritis 2016   chronic   History of kidney stones    has a small one found on xray   History of nuclear stress test 12/16/2014   Intermediate risk nuclear study w/ medium size moderate severity reversible defect in the basal and mid inferolateral and inferior wall (per dr cardiology note , dr vina gull did not think this was consistent with ischemia)/  normal LV function and wall motion, ef 75%   History of septic shock 01/22/2015   in setting Group A Strep Cellulits erysipelas/chest wall induration with streptoccocus basterium-- Severe sepsis, DIC, Acute respiratory failure with pulmonary edema, Acute Kidney failure with chronic interstitial nephritis   Hypertension    Hyponatremia    Migraines    on Zoloft  for migraines   Mild carotid artery disease (HCC)    per duplex 08-30-2017 bilateral ICA 1-39%   OA (osteoarthritis) rheumotologist-  dr jon jacob   both knees,  shoulders, ankles  OSA on CPAP     moderate obstructive sleep apnea with an AHI of 23.4/h and oxygen  desaturations as low as 84%.  Now on CPAP at 12 cm H2O.   PAF (paroxysmal atrial fibrillation) [facility]) 2009   cardiologist-- dr vina gull   Paroxysmal atrial flutter [facility])    a. dx 11/2017.   PSVT (paroxysmal supraventricular tachycardia) (HCC)    RA (rheumatoid arthritis) (HCC)    Wears contact lenses      Surgical History:  Past Surgical History:  Procedure Laterality Date   BREAST BIOPSY Right 05/15/2017   PASH   CARDIOVERSION N/A 08/28/2021   Procedure: CARDIOVERSION;  Surgeon: gull vina [name], MD;  Location:  [facility] ENDOSCOPY;  Service: Cardiovascular;  Laterality: N/A;   CESAREAN SECTION  1980   CHOLECYSTECTOMY N/A 11/16/2021   Procedure: LAPAROSCOPIC CHOLECYSTECTOMY;  Surgeon: Rubin Calamity, MD;  Location: [facility] OR;  Service: General;  Laterality: N/A;   COLONOSCOPY     ESOPHAGOGASTRODUODENOSCOPY N/A 02/08/2015   Procedure: ESOPHAGOGASTRODUODENOSCOPY (EGD);  Surgeon: Lamar JONETTA Aho, MD;  Location: [facility] ENDOSCOPY;  Service: Endoscopy;  Laterality: N/A;   KNEE ARTHROSCOPY W/ MENISCAL REPAIR Left 08/2014    @[facility]    SHOULDER SURGERY Right 04/04/2016   TOTAL HIP ARTHROPLASTY Right 06/19/2022   Procedure: RIGHT TOTAL HIP ARTHROPLASTY ANTERIOR APPROACH;  Surgeon: Vernetta Lonni GRADE, MD;  Location: [facility] OR;  Service: Orthopedics;  Laterality: Right;   TOTAL KNEE ARTHROPLASTY Right 09/01/2018   Procedure: RIGHT TOTAL KNEE ARTHROPLASTY;  Surgeon: Rubie Kemps, MD;  Location: [facility] ORS;  Service: Orthopedics;  Laterality: Right;     Medications Prior to Admission  Medication Sig Dispense Refill Last Dose/Taking   acetaminophen  (TYLENOL ) 500 MG tablet Take 1,000 mg by mouth 2 (two) times daily as needed for moderate pain (pain score 4-6) or headache.   Past Week   ALPRAZolam  (XANAX ) 0.25 MG tablet Take 1 tablet (0.25 mg total) by mouth 2 (two) times daily as needed for anxiety. 60 tablet 3 Past Month   cholecalciferol  (VITAMIN D3) 25 MCG (1000 UT) tablet Take 1,000 Units by mouth daily.   06/23/2024 Morning   ELIQUIS  5 MG TABS tablet TAKE 1 TABLET(5 MG) BY MOUTH TWICE DAILY 60 tablet 5 06/23/2024 at  8:00 AM   famotidine  (PEPCID ) 20 MG tablet TAKE 1 TABLET(20 MG) BY MOUTH AT BEDTIME 90 tablet 3 Past Week   flecainide  (TAMBOCOR ) 50 MG tablet TAKE 1 AND 1/2 TABLETS(75 MG) BY MOUTH TWICE DAILY 270 tablet 3 06/23/2024 Morning   fluticasone  (FLONASE ) 50 MCG/ACT nasal spray Place 2 sprays into both nostrils daily as needed for allergies or rhinitis.   Past Month   furosemide  (LASIX ) 40 MG tablet Take 40 mg by mouth daily  as needed for fluid or edema.   Past Week   HYDROcodone -acetaminophen  (NORCO/VICODIN) 5-325 MG tablet Take 1 tablet by mouth every 6 (six) hours as needed for severe pain (pain score 7-10). 20 tablet 0 Past Month   hydroxychloroquine  (PLAQUENIL ) 200 MG tablet Take 200 mg by mouth daily.   06/23/2024 Morning   levalbuterol  (XOPENEX  HFA) 45 MCG/ACT inhaler Inhale 2 puffs into the lungs every 6 (six) hours as needed for wheezing. 1 each 12 Unknown   lidocaine -prilocaine  (EMLA ) cream Apply 1 Application topically daily as needed (pain).   Unknown   loratadine  (CLARITIN ) 10 MG tablet Take 10 mg by mouth daily as needed for allergies.   Past Month   metoprolol  tartrate (LOPRESSOR ) 25 MG tablet Take 2 tablets (50 mg total) by  mouth 2 (two) times daily. (Patient taking differently: Take 50 mg by mouth See admin instructions. Take 2 tablets (50mg ) by mouth every morning and take 1-2 tablets (25-50mg ) at night.)   06/23/2024 Morning   potassium chloride  (KLOR-CON ) 10 MEQ tablet Take 2 tablets by mouth daily (Patient taking differently: Take 20 mEq by mouth daily as needed (Lasix  use).) 180 tablet 3 Past Week   rosuvastatin  (CRESTOR ) 5 MG tablet Take 1 tablet (5 mg total) by mouth daily. 90 tablet 3 06/23/2024 Morning   sertraline  (ZOLOFT ) 50 MG tablet TAKE 1 TABLET BY MOUTH DAILY 90 tablet 1 06/23/2024 Morning   triamcinolone  ointment (KENALOG ) 0.1 % Apply 1 Application topically 2 (two) times daily as needed (skin irritation).   Past Month   tirzepatide  (ZEPBOUND ) 2.5 MG/0.5ML injection vial Inject 2.5 mg into the skin once a week. (Patient taking differently: Inject 2.5 mg into the skin every Saturday.) 2 mL 3     Inpatient Medications:   apixaban   5 mg Oral BID   cholecalciferol   1,000 Units Oral Daily   famotidine   20 mg Oral QHS   hydrALAZINE   10 mg Oral Q8H   hydroxychloroquine   200 mg Oral Daily   rosuvastatin   5 mg Oral Daily   sertraline   50 mg Oral Daily    Allergies:  Allergies  Allergen Reactions    Epipen  2-Pak [Epinephrine ] Other (See Comments)    Tachycardia    Vibramycin  [Doxycycline ] Hives   Bayer Aspirin  [Aspirin ] Other (See Comments)    Tachycardia  OK to take EC aspirin , however   Benadryl  [Diphenhydramine ] Other (See Comments)    Tachycardia    Cefdinir  Diarrhea   Covid-19 Ad26 Vaccine(Janssen) Hives        Fish Allergy  Hives    Imitation crab meat (whitefish)   Hylan G-F 20 Other (See Comments)    Very painful (knee injection)   Penicillins Other (See Comments)    Pt previously has been told not to take because of family history of reactions (water  blisters). She took amoxicillin  in 2012-2013 with no reaction Pt received ancef  on 11-16-2021 without issue   Tape Rash    Blistering rash   Wound Dressing Adhesive Rash    Blistering rash    Family History  Problem Relation Age of Onset   Pancreatic cancer Brother    Lymphoma Mother    Prostate cancer Father        metastatic prostate cancer   Breast cancer Sister 80   Allergic rhinitis Sister    Urticaria Sister    Asthma Neg Hx    Eczema Neg Hx    Immunodeficiency Neg Hx    Atopy Neg Hx    Angioedema Neg Hx      Physical Exam: Vitals:   06/25/24 0258 06/25/24 0300 06/25/24 0623 06/25/24 0719  BP: 98/66  (!) 173/66 (!) 148/75  Pulse: 77 72  78  Resp: (!) 24 17  16   Temp: 97.6 F (36.4 C)   97.7 F (36.5 C)  TempSrc: Axillary   Oral  SpO2: 100% 100%  100%  Weight:      Height:        GEN- NAD, A&O x 3, normal affect HEENT: Normocephalic, atraumatic Lungs- CTAB, Normal effort.  Heart- Regular rate and rhythm, No M/G/R.  GI- Soft, NT, ND.  Extremities- No clubbing, cyanosis, or edema   Radiology/Studies: DG Chest Port 1 View Result Date: 06/23/2024 CLINICAL DATA:  Chest pain. EXAM: PORTABLE CHEST 1 VIEW COMPARISON:  04/24/2024. FINDINGS: The heart size and mediastinal contours are unchanged. Defibrillator pad overlies the left chest wall. Aortic atherosclerosis. No focal consolidation, pleural  effusion, or pneumothorax. No acute osseous abnormality. IMPRESSION: No acute cardiopulmonary findings. Electronically Signed   By: Harrietta Sherry M.D.   On: 06/23/2024 11:39    EKG:06/23/2024 at 1234 - appears SR ~70 with freq PAC and blocked PAC, considerable baseline artifact   (personally reviewed)  TELEMETRY: SR in 70s with episodes of ?AT 120-130s  (personally reviewed)  DEVICE HISTORY: none  Assessment/Plan: #) parox AFib, aflutter #) bradycardia #) PAC #) presyncope  Presented to [facility] with episodes of dizziness, presyncope No significant bradycardia or pauses, appears to have freq PAC / blocked PACs on tele Lopressor  + flecainide  held with improvement in symptoms, though she is having intermittent periods of what appears to be Atach in 120-130s No indication for PPM at this time Amanda Wilkins transition AAD to multaq  400mg  BID, first dose tonight Keep K > 4, Mag > 2  Briefly discussed AF ablation in future. Amanda Wilkins discuss further as outpatient         For questions or updates, please contact [facility] Please consult [email or web address] for contact info under Cardiology/STEMI.  Signed, Amanda Riddle, NP  06/25/2024 11:09 AM     I have seen and examined this patient with Amanda Wilkins.  Agree with above, note added to reflect my findings.  Patient with a past history as above.  She presented to [facility] for preop appointment for outpatient TKA.  She felt well that morning, but then began to feel a strange sensation in her chest.  She became dizzy and presyncopal.  She never had that feeling before.  She went to the emergency room at [facility].  She was noted to be bradycardic at the time.  She was admitted to the hospital.  She was on both flecainide  and metoprolol .  These were held.  She felt better yesterday and today.  She has walked around the halls and has had some dizziness which she feels is related to tachycardia.  Otherwise no acute complaints.  She feels much  improved from 2 days ago.  GEN: No acute distress.   Neck: No JVD Cardiac: Regular rhythm Respiratory: Normal work of breathing GI: Soft, nontender, non-distended  MS: No edema; No deformity. Neuro:  Nonfocal  Skin: warm and dry Psych: Normal affect    Paroxysmal atrial fibrillation/flutter Likely junctional bradycardia  PACs  For now, would continue to hold metoprolol  and flecainide .  Unnamed Zeien start Multaq  400 mg twice daily.  First dose tonight.  If she tolerates the dose tonight well and has no issues tomorrow after her second dose, could be discharged later in the morning.  We Riot Waterworth arrange for follow-up in clinic.  She may benefit from ablation as an outpatient.  Amanda Wilkins M. Amanda Hedeen MD 06/25/2024 3:00 PM

## 2024-06-25 NOTE — Progress Notes (Signed)
 Patient reported that she sneezed and felt her heart speed up. At 2113 on tele looks like patient went into SVT to 130s, episode ended at 2120. See saved tele strip.

## 2024-06-25 NOTE — Hospital Course (Addendum)
 Brief Narrative:   78 year old female history of chronic interstitial nephritis, bronchitis, AKI, prior history of septic shock due to cellulitis, cholecystitis, depression, GERD, hypertension, hyponatremia, migraine headaches, OSA on CPAP, PSVT, paroxysmal A-fib on anticoagulation with history of cardioversion presented to the ED with lightheadedness and dizziness and presyncopal episode while getting preop lab work done. Initial EKG noted patient to be in A-fib however patient also noted to develop a junctional bradycardia with heart rates of 38 bpm. Patient seen by cardiology who recommended transfer to [facility] for evaluation by EP.   Assessment & Plan:  Presyncope Paroxysmal atrial fibrillation/flutter/atrial tachycardia Junctional bradycardia -Previously patient has been on flecainide , Lopressor  and Eliquis  presented for preop knee surgery.  Seen by cardiology team.  Currently flecainide  and beta-blocker has been stopped.  Optimize electrolytes.  TSH is normal. -Echocardiogram - EP evaluation   Hyponatremia -Suspected from hypovolemia improved with fluids.   Hypertension -Beta-blockers are on hold.  Currently on hydralazine  3 times daily, will adjust as necessary.   Chronic heart failure with preserved ejection fraction/HFpEF -Appears to be euvolemic.  Currently home medications/diuretics are on hold due due to hyponatremia   OSA -CPAP nightly.   Rheumatoid arthritis -Continue home regimen hydroxychloroquine .   Hyperlipidemia -Continue statin.    Mixed anxiety and depressive disorder -Continue home regimen sertraline  50 mg daily, alprazolam  0.25 mg twice daily as needed.     DVT prophylaxis: Eliquis  Code Status: Full Family Communication:  Disposition: Pending EP eval.      Consultants:  Cardiology   Procedures:  Chest x-ray 06/23/2024 2D echo pending   PT Follow up Recs:   Subjective: Seen at bedside no complaints besides 1 oral sore and recently  operated dental site   Examination:  General exam: Appears calm and comfortable  Respiratory system: Clear to auscultation. Respiratory effort normal. Cardiovascular system: S1 & S2 heard, RRR. No JVD, murmurs, rubs, gallops or clicks. No pedal edema. Gastrointestinal system: Abdomen is nondistended, soft and nontender. No organomegaly or masses felt. Normal bowel sounds heard. Central nervous system: Alert and oriented. No focal neurological deficits. Extremities: Symmetric 5 x 5 power. Skin: No rashes, lesions or ulcers Psychiatry: Judgement and insight appear normal. Mood & affect appropriate.

## 2024-06-25 NOTE — TOC Progression Note (Signed)
 Transition of Care [facility]) - Progression Note    Patient Details  Name: Amanda Wilkins MRN: [id number] Date of Birth: [birth year]  [facility] CM/SW Contact  Lauraine FORBES Saa, LCSWA Phone Number: 06/25/2024, 10:21 AM  Clinical Narrative:     10:21 AM Per chart review, patient resides at home with spouse. Patient has a PCP and insurance. Patient has SNF history with [facility]. Patient has HH history with [facility]. Patient has DME (CPAP, BSC, rolling walker) history with [facility]. Patient's preferred pharmacy's are [phone] and [facility] [place]. TOC will continue to follow and be available to assist.  Expected Discharge Plan: Home/Self Care Barriers to Discharge: Continued Medical Work up               Expected Discharge Plan and Services In-house Referral: NA Discharge Planning Services: CM Consult Post Acute Care Choice: Durable Medical Equipment Living arrangements for the past 2 months: Single Family Home                 DME Arranged: N/A DME Agency: NA       HH Arranged: NA HH Agency: NA         Social Drivers of Health (SDOH) Interventions SDOH Screenings   Food Insecurity: No Food Insecurity (06/23/2024)  Housing: Low Risk  (06/23/2024)  Transportation Needs: No Transportation Needs (06/23/2024)  Utilities: Not At Risk (06/23/2024)  Alcohol Screen: Low Risk  (04/23/2024)  Depression (PHQ2-9): Low Risk  (04/24/2024)  Financial Resource Strain: Low Risk  (04/23/2024)  Physical Activity: Insufficiently Active (04/23/2024)  Social Connections: Moderately Isolated (06/23/2024)  Stress: No Stress Concern Present (04/23/2024)  Tobacco Use: Low Risk  (06/23/2024)  Health Literacy: Adequate Health Literacy (03/26/2024)    Readmission Risk Interventions     No data to display

## 2024-06-25 NOTE — Plan of Care (Signed)
Discussed with patient plan of care for the evening, pain management and medications with some teach back displayed  Problem: Education: Goal: Knowledge of General Education information will improve Description: Including pain rating scale, medication(s)/side effects and non-pharmacologic comfort measures Outcome: Progressing   Problem: Health Behavior/Discharge Planning: Goal: Ability to manage health-related needs will improve Outcome: Progressing   

## 2024-06-25 NOTE — Telephone Encounter (Signed)
   Pre-operative Risk Assessment    Patient Name: Amanda Wilkins  DOB: 1946-03-01 MRN: [id number]   Date of last office visit: 01/20/24 DR. ROSS Date of next office visit: NONE   Request for Surgical Clearance    Procedure:  LEFT TOTAL KNEE ARTHROPLASTY  Date of Surgery:  Clearance 09/07/24                                Surgeon:  DR. DEMPSEY MOAN Surgeon's Group or Practice Name:  [facility] Phone number:  [phone] KERRI MAZE Fax number:  [phone]   Type of Clearance Requested:   - Medical  - Pharmacy:  Hold Apixaban  (Eliquis )     Type of Anesthesia:  CHOICE   Additional requests/questions:    [name]   06/25/2024, 2:15 PM

## 2024-06-26 ENCOUNTER — Other Ambulatory Visit (HOSPITAL_COMMUNITY): Payer: Self-pay

## 2024-06-26 DIAGNOSIS — I480 Paroxysmal atrial fibrillation: Secondary | ICD-10-CM | POA: Diagnosis not present

## 2024-06-26 DIAGNOSIS — R001 Bradycardia, unspecified: Secondary | ICD-10-CM | POA: Diagnosis not present

## 2024-06-26 DIAGNOSIS — I498 Other specified cardiac arrhythmias: Secondary | ICD-10-CM | POA: Diagnosis not present

## 2024-06-26 DIAGNOSIS — I5032 Chronic diastolic (congestive) heart failure: Secondary | ICD-10-CM | POA: Diagnosis not present

## 2024-06-26 DIAGNOSIS — R000 Tachycardia, unspecified: Secondary | ICD-10-CM | POA: Diagnosis not present

## 2024-06-26 DIAGNOSIS — I10 Essential (primary) hypertension: Secondary | ICD-10-CM | POA: Diagnosis not present

## 2024-06-26 MED ORDER — HYDRALAZINE HCL 10 MG PO TABS
10.0000 mg | ORAL_TABLET | Freq: Three times a day (TID) | ORAL | 0 refills | Status: DC
  Filled 2024-06-26: qty 90, 30d supply, fill #0

## 2024-06-26 MED ORDER — DRONEDARONE HCL 400 MG PO TABS
400.0000 mg | ORAL_TABLET | Freq: Two times a day (BID) | ORAL | 0 refills | Status: DC
  Filled 2024-06-26: qty 60, 30d supply, fill #0

## 2024-06-26 MED ORDER — AMIODARONE HCL 200 MG PO TABS
ORAL_TABLET | ORAL | 3 refills | Status: DC
  Filled 2024-06-26: qty 120, 90d supply, fill #0

## 2024-06-26 NOTE — Discharge Summary (Signed)
 Physician Discharge Summary  Amanda Wilkins FMW:[id number] DOB: [birth year] DOA: 06/23/2024  PCP: Garald Karlynn [name], MD  Admit date: 06/23/2024 Discharge date: 06/26/2024  Admitted From: Home Disposition: Home  Recommendations for Outpatient Follow-up:  Follow up with PCP in 1-2 weeks Please obtain BMP/CBC in one week your next doctors visit.  Discontinue beta-blocker and flecainide  Multaq  was cost prohibitive therefore started on amiodarone  Outpatient cardiology/EP appointment to be arranged by their service   Discharge Condition: Stable CODE STATUS: Full Diet recommendation: Low-salt  Brief/Interim Summary: Brief Narrative:   78 year old female history of chronic interstitial nephritis, bronchitis, AKI, prior history of septic shock due to cellulitis, cholecystitis, depression, GERD, hypertension, hyponatremia, migraine headaches, OSA on CPAP, PSVT, paroxysmal A-fib on anticoagulation with history of cardioversion presented to the ED with lightheadedness and dizziness and presyncopal episode while getting preop lab work done. Initial EKG noted patient to be in A-fib however patient also noted to develop a junctional bradycardia with heart rates of 38 bpm. Patient seen by cardiology who recommended transfer to [facility] for evaluation by EP.  AV nodal blockers were adjusted, flecainide  and beta-blocker was discontinued. Will discharge the patient once we have final recommendations from EP team today.  Assessment & Plan:  Presyncope Paroxysmal atrial fibrillation/flutter/atrial tachycardia Junctional bradycardia -Previously patient has been on flecainide , Lopressor  and Eliquis  presented for preop knee surgery.  Seen by cardiology team.  Currently flecainide  and beta-blocker has been stopped.  EP initially recommended Multaq  but it was cost prohibitive therefore now on amiodarone .   Hyponatremia -Suspected from hypovolemia improved with fluids.    Hypertension -Beta-blockers discontinued.  Currently on hydralazine  3 times daily, will adjust as necessary.   Chronic heart failure with preserved ejection fraction/HFpEF -Appears to be euvolemic.  Currently home medications/diuretics are on hold due due to hyponatremia   OSA -CPAP nightly.   Rheumatoid arthritis -Continue home regimen hydroxychloroquine .   Hyperlipidemia -Continue statin.    Mixed anxiety and depressive disorder -Continue home regimen sertraline  50 mg daily, alprazolam  0.25 mg twice daily as needed.     DVT prophylaxis: Eliquis  Code Status: Full Family Communication:  Disposition: Discharge today once we have final recommendations from EP team regarding cardiac medications     Consultants:  Cardiology   Procedures:  Chest x-ray 06/23/2024 2D echo preserved EF 60%   PT Follow up Recs:   Subjective: Doing well no complaints Examination:  General exam: Appears calm and comfortable  Respiratory system: Clear to auscultation. Respiratory effort normal. Cardiovascular system: S1 & S2 heard, RRR. No JVD, murmurs, rubs, gallops or clicks. No pedal edema. Gastrointestinal system: Abdomen is nondistended, soft and nontender. No organomegaly or masses felt. Normal bowel sounds heard. Central nervous system: Alert and oriented. No focal neurological deficits. Extremities: Symmetric 5 x 5 power. Skin: No rashes, lesions or ulcers Psychiatry: Judgement and insight appear normal. Mood & affect appropriate.    Discharge Diagnoses:  Principal Problem:   Junctional rhythm Active Problems:   Hyponatremia   OSA on CPAP   Primary hypertension   RA (rheumatoid arthritis) (HCC)   HLD (hyperlipidemia)   PAF (paroxysmal atrial fibrillation) (HCC)   Mixed anxiety and depressive disorder   Tachycardia-bradycardia syndrome (HCC)   PAT (paroxysmal atrial tachycardia) (HCC)   Chronic diastolic CHF (congestive heart failure) (HCC)   Bradycardia   SVT  (supraventricular tachycardia) (HCC)      Discharge Exam: Vitals:   06/26/24 0608 06/26/24 0745  BP:  (!) 120/53  Pulse:    Resp: 19  17  Temp:  98.4 F (36.9 C)  SpO2:  98%   Vitals:   06/26/24 0400 06/26/24 0524 06/26/24 0608 06/26/24 0745  BP:  (!) 164/74  (!) 120/53  Pulse: 68 70    Resp: 16 19 19 17   Temp:  98.1 F (36.7 C)  98.4 F (36.9 C)  TempSrc:  Oral  Oral  SpO2: 100% 98%  98%  Weight:      Height:          Discharge Instructions   Allergies as of 06/26/2024       Reactions   Epipen  2-pak [epinephrine ] Other (See Comments)   Tachycardia    Vibramycin  [doxycycline ] Hives   Bayer Aspirin  [aspirin ] Other (See Comments)   Tachycardia  OK to take EC aspirin , however   Benadryl  [diphenhydramine ] Other (See Comments)   Tachycardia    Cefdinir  Diarrhea   Covid-19 Ad26 Vaccine(janssen) Hives      Fish Allergy  Hives   Imitation crab meat (whitefish)   Hylan G-f 20 Other (See Comments)   Very painful (knee injection)   Penicillins Other (See Comments)   Pt previously has been told not to take because of family history of reactions (water  blisters). She took amoxicillin  in 2012-2013 with no reaction Pt received ancef  on 11-16-2021 without issue   Tape Rash   Blistering rash   Wound Dressing Adhesive Rash   Blistering rash        Medication List     STOP taking these medications    flecainide  50 MG tablet Commonly known as: TAMBOCOR    metoprolol  tartrate 25 MG tablet Commonly known as: LOPRESSOR        TAKE these medications    acetaminophen  500 MG tablet Commonly known as: TYLENOL  Take 1,000 mg by mouth 2 (two) times daily as needed for moderate pain (pain score 4-6) or headache.   ALPRAZolam  0.25 MG tablet Commonly known as: XANAX  Take 1 tablet (0.25 mg total) by mouth 2 (two) times daily as needed for anxiety.   amiodarone  200 MG tablet Commonly known as: Pacerone  Take 1 pill twice a day (morning and night) for 1 month, then  reduce to 1 pill once a day   cholecalciferol  25 MCG (1000 UNIT) tablet Commonly known as: VITAMIN D3 Take 1,000 Units by mouth daily.   Eliquis  5 MG Tabs tablet Generic drug: apixaban  TAKE 1 TABLET(5 MG) BY MOUTH TWICE DAILY   famotidine  20 MG tablet Commonly known as: PEPCID  TAKE 1 TABLET(20 MG) BY MOUTH AT BEDTIME   fluticasone  50 MCG/ACT nasal spray Commonly known as: FLONASE  Place 2 sprays into both nostrils daily as needed for allergies or rhinitis.   furosemide  40 MG tablet Commonly known as: LASIX  Take 40 mg by mouth daily as needed for fluid or edema.   hydrALAZINE  10 MG tablet Commonly known as: APRESOLINE  Take 1 tablet (10 mg total) by mouth every 8 (eight) hours.   HYDROcodone -acetaminophen  5-325 MG tablet Commonly known as: NORCO/VICODIN Take 1 tablet by mouth every 6 (six) hours as needed for severe pain (pain score 7-10).   hydroxychloroquine  200 MG tablet Commonly known as: PLAQUENIL  Take 200 mg by mouth daily.   levalbuterol  45 MCG/ACT inhaler Commonly known as: Xopenex  HFA Inhale 2 puffs into the lungs every 6 (six) hours as needed for wheezing.   lidocaine -prilocaine  cream Commonly known as: EMLA  Apply 1 Application topically daily as needed (pain).   loratadine  10 MG tablet Commonly known as: CLARITIN  Take 10 mg by mouth daily  as needed for allergies.   potassium chloride  10 MEQ tablet Commonly known as: KLOR-CON  Take 2 tablets by mouth daily What changed:  how much to take how to take this when to take this reasons to take this additional instructions   rosuvastatin  5 MG tablet Commonly known as: CRESTOR  Take 1 tablet (5 mg total) by mouth daily.   sertraline  50 MG tablet Commonly known as: ZOLOFT  TAKE 1 TABLET BY MOUTH DAILY   tirzepatide  2.5 MG/0.5ML injection vial Commonly known as: ZEPBOUND  Inject 2.5 mg into the skin once a week. What changed: when to take this   triamcinolone  ointment 0.1 % Commonly known as:  KENALOG  Apply 1 Application topically 2 (two) times daily as needed (skin irritation).        Follow-up Information     Plotnikov, Karlynn [name], MD Follow up in 1 week(s).   Specialty: Internal Medicine Contact information: [address] [place] [address] [phone]                Allergies  Allergen Reactions   Epipen  2-Pak [Epinephrine ] Other (See Comments)    Tachycardia    Vibramycin  [Doxycycline ] Hives   Bayer Aspirin  [Aspirin ] Other (See Comments)    Tachycardia  OK to take EC aspirin , however   Benadryl  [Diphenhydramine ] Other (See Comments)    Tachycardia    Cefdinir  Diarrhea   Covid-19 Ad26 Vaccine(Janssen) Hives        Fish Allergy  Hives    Imitation crab meat (whitefish)   Hylan G-F 20 Other (See Comments)    Very painful (knee injection)   Penicillins Other (See Comments)    Pt previously has been told not to take because of family history of reactions (water  blisters). She took amoxicillin  in 2012-2013 with no reaction Pt received ancef  on 11-16-2021 without issue   Tape Rash    Blistering rash   Wound Dressing Adhesive Rash    Blistering rash    You were cared for by a hospitalist during your hospital stay. If you have any questions about your discharge medications or the care you received while you were in the hospital after you are discharged, you can call the unit and asked to speak with the hospitalist on call if the hospitalist that took care of you is not available. Once you are discharged, your primary care physician will handle any further medical issues. Please note that no refills for any discharge medications will be authorized once you are discharged, as it is imperative that you return to your primary care physician (or establish a relationship with a primary care physician if you do not have one) for your aftercare needs so that they can reassess your need for medications and monitor your lab values.  You were cared for by a  hospitalist during your hospital stay. If you have any questions about your discharge medications or the care you received while you were in the hospital after you are discharged, you can call the unit and asked to speak with the hospitalist on call if the hospitalist that took care of you is not available. Once you are discharged, your primary care physician will handle any further medical issues. Please note that NO REFILLS for any discharge medications will be authorized once you are discharged, as it is imperative that you return to your primary care physician (or establish a relationship with a primary care physician if you do not have one) for your aftercare needs so that they can reassess your  need for medications and monitor your lab values.  Please request your Prim.MD to go over all Hospital Tests and Procedure/Radiological results at the follow up, please get all Hospital records sent to your Prim MD by signing hospital release before you go home.  Get CBC, CMP, 2 view Chest X ray checked  by Primary MD during your next visit or SNF MD in 5-7 days ( we routinely change or add medications that can affect your baseline labs and fluid status, therefore we recommend that you get the mentioned basic workup next visit with your PCP, your PCP may decide not to get them or add new tests based on their clinical decision)  On your next visit with your primary care physician please Get Medicines reviewed and adjusted.  If you experience worsening of your admission symptoms, develop shortness of breath, life threatening emergency, suicidal or homicidal thoughts you must seek medical attention immediately by calling 911 or calling your MD immediately  if symptoms less severe.  You Must read complete instructions/literature along with all the possible adverse reactions/side effects for all the Medicines you take and that have been prescribed to you. Take any new Medicines after you have completely understood  and accpet all the possible adverse reactions/side effects.   Do not drive, operate heavy machinery, perform activities at heights, swimming or participation in water  activities or provide baby sitting services if your were admitted for syncope or siezures until you have seen by Primary MD or a Neurologist and advised to do so again.  Do not drive when taking Pain medications.   Procedures/Studies: ECHOCARDIOGRAM COMPLETE Result Date: 06/25/2024    ECHOCARDIOGRAM REPORT   Patient Name:   Amanda Wilkins Date of Exam: 06/25/2024 Medical Rec #:  [id number]           Height:       65.0 in Accession #:    [id number]          Weight:       238.5 lb Date of Birth:  11/06/1945           BSA:          2.132 m Patient Age:    78 years            BP:           148/75 mmHg Patient Gender: F                   HR:           81 bpm. Exam Location:  Inpatient Procedure: 2D Echo, Cardiac Doppler and Color Doppler (Both Spectral and Color            Flow Doppler were utilized during procedure). Indications:    Abnormal ECG R94.31  History:        Patient has prior history of Echocardiogram examinations, most                 recent 10/30/2022. CAD; Risk Factors:Hypertension and Sleep                 Apnea.  Sonographer:    Jayson Gaskins Referring Phys: [id number] TAYLOR A PARCELLS IMPRESSIONS  1. Left ventricular ejection fraction, by estimation, is 55 to 60%. The left ventricle has normal function. The left ventricle has no regional wall motion abnormalities. Left ventricular diastolic parameters were normal.  2. Right ventricular systolic function is normal. The right ventricular size is normal.  3. The mitral valve is  normal in structure. Mild to moderate mitral valve regurgitation. No evidence of mitral stenosis.  4. The aortic valve is normal in structure. Aortic valve regurgitation is not visualized. No aortic stenosis is present.  5. The inferior vena cava is normal in size with greater than 50% respiratory variability,  suggesting right atrial pressure of 3 mmHg. FINDINGS  Left Ventricle: Left ventricular ejection fraction, by estimation, is 55 to 60%. The left ventricle has normal function. The left ventricle has no regional wall motion abnormalities. The left ventricular internal cavity size was normal in size. There is  no left ventricular hypertrophy. Left ventricular diastolic parameters were normal. Right Ventricle: The right ventricular size is normal. No increase in right ventricular wall thickness. Right ventricular systolic function is normal. Left Atrium: Left atrial size was normal in size. Right Atrium: Right atrial size was normal in size. Pericardium: There is no evidence of pericardial effusion. Mitral Valve: The mitral valve is normal in structure. Mild to moderate mitral valve regurgitation. No evidence of mitral valve stenosis. Tricuspid Valve: The tricuspid valve is normal in structure. Tricuspid valve regurgitation is not demonstrated. No evidence of tricuspid stenosis. Aortic Valve: The aortic valve is normal in structure. Aortic valve regurgitation is not visualized. No aortic stenosis is present. Aortic valve mean gradient measures 6.0 mmHg. Aortic valve peak gradient measures 8.9 mmHg. Aortic valve area, by VTI measures 2.38 cm. Pulmonic Valve: The pulmonic valve was normal in structure. Pulmonic valve regurgitation is not visualized. No evidence of pulmonic stenosis. Aorta: The aortic root is normal in size and structure. Venous: The inferior vena cava is normal in size with greater than 50% respiratory variability, suggesting right atrial pressure of 3 mmHg. IAS/Shunts: No atrial level shunt detected by color flow Doppler.  LEFT VENTRICLE PLAX 2D LVIDd:         3.93 cm   Diastology LVIDs:         2.76 cm   LV e' medial:    7.83 cm/s LV PW:         1.00 cm   LV E/e' medial:  16.9 LV IVS:        0.82 cm   LV e' lateral:   15.90 cm/s LVOT diam:     1.83 cm   LV E/e' lateral: 8.3 LV SV:         85 LV SV  Index:   40 LVOT Area:     2.63 cm  RIGHT VENTRICLE RV S prime:     10.60 cm/s TAPSE (M-mode): 2.3 cm LEFT ATRIUM             Index        RIGHT ATRIUM           Index LA Vol (A2C):   66.6 ml 31.24 ml/m  RA Area:     15.30 cm LA Vol (A4C):   45.5 ml 21.34 ml/m  RA Volume:   34.80 ml  16.32 ml/m LA Biplane Vol: 55.4 ml 25.99 ml/m  AORTIC VALVE AV Area (Vmax):    2.44 cm AV Area (Vmean):   2.20 cm AV Area (VTI):     2.38 cm AV Vmax:           149.00 cm/s AV Vmean:          117.000 cm/s AV VTI:            0.356 m AV Peak Grad:      8.9 mmHg AV Mean Grad:  6.0 mmHg LVOT Vmax:         138.00 cm/s LVOT Vmean:        97.700 cm/s LVOT VTI:          0.322 m LVOT/AV VTI ratio: 0.90  AORTA Ao Root diam: 2.52 cm MITRAL VALVE MV Area (PHT): 3.26 cm     SHUNTS MV Decel Time: 233 msec     Systemic VTI:  0.32 m MV E velocity: 132.00 cm/s  Systemic Diam: 1.83 cm MV A velocity: 54.50 cm/s MV E/A ratio:  2.42 Kardie Tobb DO Electronically signed by Dub Huntsman DO Signature Date/Time: 06/25/2024/6:02:09 PM    Final    DG Chest Port 1 View Result Date: 06/23/2024 CLINICAL DATA:  Chest pain. EXAM: PORTABLE CHEST 1 VIEW COMPARISON:  04/24/2024. FINDINGS: The heart size and mediastinal contours are unchanged. Defibrillator pad overlies the left chest wall. Aortic atherosclerosis. No focal consolidation, pleural effusion, or pneumothorax. No acute osseous abnormality. IMPRESSION: No acute cardiopulmonary findings. Electronically Signed   By: Harrietta Sherry M.D.   On: 06/23/2024 11:39     The results of significant diagnostics from this hospitalization (including imaging, microbiology, ancillary and laboratory) are listed below for reference.     Microbiology: Recent Results (from the past 240 hours)  Surgical pcr screen     Status: None   Collection Time: 06/23/24  2:14 PM   Specimen: Nasal Mucosa; Nasal Swab  Result Value Ref Range Status   MRSA, PCR NEGATIVE NEGATIVE Final   Staphylococcus aureus NEGATIVE  NEGATIVE Final    Comment: (NOTE) The Xpert SA Assay (FDA approved for NASAL specimens in patients 18 years of age and older), is one component of a comprehensive surveillance program. It is not intended to diagnose infection nor to guide or monitor treatment. Performed at [facility], [address] [place], [place] [address]   MRSA Next Gen by PCR, Nasal     Status: None   Collection Time: 06/24/24  5:51 PM   Specimen: Nasal Mucosa; Nasal Swab  Result Value Ref Range Status   MRSA by PCR Next Gen NOT DETECTED NOT DETECTED Final    Comment: (NOTE) The GeneXpert MRSA Assay (FDA approved for NASAL specimens only), is one component of a comprehensive MRSA colonization surveillance program. It is not intended to diagnose MRSA infection nor to guide or monitor treatment for MRSA infections. Test performance is not FDA approved in patients less than 54 years old. Performed at [facility], [address], [place], [place] [address]      Labs: BNP (last 3 results) No results for input(s): BNP in the last 8760 hours. Basic Metabolic Panel: Recent Labs  Lab 06/23/24 1055 06/23/24 1458 06/24/24 0328 06/24/24 1700 06/25/24 0702 06/25/24 0934  NA 122* 123* 126* 129* 131* 130*  K 5.2* 4.7 4.2 4.6 4.0 4.0  CL 87* 89* 95* 98 99 99  CO2 19* 19* 19* 19* 20* 19*  GLUCOSE 101* 104* 91 95 84 101*  BUN 11 10 9 11  7* 8  CREATININE 0.84 0.74 0.63 0.65 0.72 0.77  CALCIUM  10.3 9.8 9.2 9.3 9.2 9.5  MG 2.3  --   --   --  2.0 2.1  PHOS  --  2.8  --   --  3.7  --    Liver Function Tests: Recent Labs  Lab 06/23/24 1458 06/24/24 0328 06/25/24 0702  AST 39 30  --   ALT 38 31  --   ALKPHOS  82 72  --   BILITOT 0.9 1.0  --   PROT 7.2 6.7  --   ALBUMIN  4.5 4.2 3.7   No results for input(s): LIPASE, AMYLASE in the last 168 hours. No results for input(s): AMMONIA in the last 168 hours. CBC: Recent Labs  Lab 06/23/24 1055 06/24/24 0328  WBC 6.4 6.3   HGB 15.2* 12.4  HCT 44.4 36.3  MCV 94.7 96.5  PLT 201 155   Cardiac Enzymes: No results for input(s): CKTOTAL, CKMB, CKMBINDEX, TROPONINI in the last 168 hours. BNP: Invalid input(s): POCBNP CBG: Recent Labs  Lab 06/25/24 1133  GLUCAP 81   D-Dimer No results for input(s): DDIMER in the last 72 hours. Hgb A1c No results for input(s): HGBA1C in the last 72 hours. Lipid Profile No results for input(s): CHOL, HDL, LDLCALC, TRIG, CHOLHDL, LDLDIRECT in the last 72 hours. Thyroid  function studies Recent Labs    06/23/24 1118  TSH 4.090   Anemia work up No results for input(s): VITAMINB12, FOLATE, FERRITIN, TIBC, IRON, RETICCTPCT in the last 72 hours. Urinalysis    Component Value Date/Time   COLORURINE YELLOW 11/24/2023 0917   APPEARANCEUR CLEAR 11/24/2023 0917   LABSPEC 1.012 11/24/2023 0917   PHURINE 6.5 11/24/2023 0917   GLUCOSEU NEGATIVE 11/24/2023 0917   GLUCOSEU NEGATIVE 02/26/2017 1212   HGBUR NEGATIVE 11/24/2023 0917   BILIRUBINUR NEGATIVE 11/24/2023 0917   BILIRUBINUR neg 05/19/2020 1452   KETONESUR NEGATIVE 11/24/2023 0917   PROTEINUR NEGATIVE 11/24/2023 0917   UROBILINOGEN 0.2 05/19/2020 1452   UROBILINOGEN 0.2 02/26/2017 1212   NITRITE NEGATIVE 11/24/2023 0917   LEUKOCYTESUR NEGATIVE 11/24/2023 0917   Sepsis Labs Recent Labs  Lab 06/23/24 1055 06/24/24 0328  WBC 6.4 6.3   Microbiology Recent Results (from the past 240 hours)  Surgical pcr screen     Status: None   Collection Time: 06/23/24  2:14 PM   Specimen: Nasal Mucosa; Nasal Swab  Result Value Ref Range Status   MRSA, PCR NEGATIVE NEGATIVE Final   Staphylococcus aureus NEGATIVE NEGATIVE Final    Comment: (NOTE) The Xpert SA Assay (FDA approved for NASAL specimens in patients 80 years of age and older), is one component of a comprehensive surveillance program. It is not intended to diagnose infection nor to guide or monitor treatment. Performed at  [facility], [address] [place], [place] [address]   MRSA Next Gen by PCR, Nasal     Status: None   Collection Time: 06/24/24  5:51 PM   Specimen: Nasal Mucosa; Nasal Swab  Result Value Ref Range Status   MRSA by PCR Next Gen NOT DETECTED NOT DETECTED Final    Comment: (NOTE) The GeneXpert MRSA Assay (FDA approved for NASAL specimens only), is one component of a comprehensive MRSA colonization surveillance program. It is not intended to diagnose MRSA infection nor to guide or monitor treatment for MRSA infections. Test performance is not FDA approved in patients less than 24 years old. Performed at [facility], [address] [place], [place] [address]      Time coordinating discharge:  I have spent 35 minutes face to face with the patient and on the ward discussing the patients care, assessment, plan and disposition with other care givers. >50% of the time was devoted counseling the patient about the risks and benefits of treatment/Discharge disposition and coordinating care.   SIGNED:   Burgess JAYSON Dare, MD  [facility] 06/26/2024, 10:20 AM   If 7PM-7AM, please contact  night-coverage

## 2024-06-26 NOTE — Progress Notes (Addendum)
  Patient Name: Amanda Wilkins Date of Encounter: 06/26/2024  Primary Cardiologist: Vina Gull, MD Electrophysiologist: None  Interval Summary   NAEON, resting comfortably this AM in bed. No episodes of palpitations or LH  Vital Signs    Vitals:   06/26/24 0400 06/26/24 0524 06/26/24 0608 06/26/24 0745  BP:  (!) 164/74  (!) 120/53  Pulse: 68 70    Resp: 16 19 19 17   Temp:  98.1 F (36.7 C)  98.4 F (36.9 C)  TempSrc:  Oral  Oral  SpO2: 100% 98%  98%  Weight:      Height:        Intake/Output Summary (Last 24 hours) at 06/26/2024 0808 Last data filed at 06/26/2024 0558 Gross per 24 hour  Intake 360 ml  Output --  Net 360 ml   Filed Weights   06/23/24 1040 06/23/24 1416 06/24/24 1746  Weight: 107 kg 108.5 kg 108.2 kg    Physical Exam    GEN- NAD, Alert and oriented  Lungs- Clear to ausculation bilaterally, normal work of breathing Cardiac- Regular rate and rhythm, no murmurs, rubs or gallops GI- soft, NT, ND, + BS Extremities- no clubbing or cyanosis. No edema  Telemetry    SR with very brief AT <1 min around MN (personally reviewed)  Hospital Course    Amanda Wilkins is a 78 y.o. female with PMH of parox AFib, aflutter (on eliquis , flecainide  + metop), HTN, interstitial nephritis, OSA on CPAP, HLD, chronic lower extremity edema admitted for tachy-brady  Assessment & Plan    #) parox Afib #) bradycardia #) Atach #) presyncope  Holding home flecainide  + metop with improvement in LH Started multaq  last evening, no significant arrhythmia burden overnight No bradycardia or pauses noted, no indication for PPM at this time Would continue multaq , if continues to feel well after AM dose, ok to discharge Keep K > 4, Mag > 2  Outpatient appt requested    [facility] Kameron Blethen sign off.   The patient is ready for discharge today from a cardiac standpoint. Medication Recommendations:  400mg  multaq  BID Other recommendations (labs, testing, etc):   none Follow up as an outpatient:  yes, TBD For questions or updates, please contact [facility] Please consult [email or web address] for contact info under     Signed, Suzann Riddle, NP  06/26/2024, 8:08 AM    I have seen and examined this patient with Elvie Needle.  Agree with above, note added to reflect my findings.  Patient with no further prolonged tachycardia.  She did have a short episode of tachycardia.  She is feeling much improved.  GEN: No acute distress.   Neck: No JVD Cardiac: RRR, no murmurs, rubs, or gallops.  Respiratory: normal BS bases bilaterally. GI: Soft, nontender, non-distended  MS: No edema; No deformity. Neuro:  Nonfocal  Skin: warm and dry Psych: Normal affect    1.  Paroxysmal atrial fibrillation Atrial tachycardia Presyncope Bradycardia  Patient has had no further episodes of bradycardia off of beta-blockers and flecainide .  She was started on Multaq , but this was apparently going to be too expensive and he had nausea per month.  Deseree Zemaitis switch to amiodarone  200 mg twice daily for 1 month followed by 200 mg daily.  Aftan Vint see her in clinic to discuss next steps and within amiodarone  Ichelle Harral be a long-term medication.  Terecia Plaut M. Bergen Melle MD 06/29/2024 8:01 AM

## 2024-06-26 NOTE — Progress Notes (Signed)
 Notified by pharmacy that multaq  400mg  BID would cost > 800/month.  Discussed with MD. Will transition to amiodarone  - 200mg  BID x 1 month, then 200mg  daily.  Patient updated on plan and is agreeable.    Will Heinkel, NP Electrophysiology 06/26/24 10:16 AM

## 2024-06-26 NOTE — Progress Notes (Signed)
 Went over AVS with patient, iv's removed, TOC meds given, patient belongings gathered and patient was wheeled out to husbands vehicle.

## 2024-06-26 NOTE — TOC Transition Note (Signed)
 Transition of Care [facility]) - Discharge Note   Patient Details  Name: Amanda Wilkins MRN: [id number] Date of Birth: 04-Feb-1946  [facility]) CM/SW Contact:  Roxie KANDICE Stain, RN Phone Number: 06/26/2024, 8:55 AM   Clinical Narrative:    Briel Gallicchio is stable to discharge home. Follow up apt on AVS. No ICM (Inpatient Care Management) needs at this time.    Final next level of care: Home/Self Care Barriers to Discharge: Barriers Resolved   Patient Goals and CMS Choice Patient states their goals for this hospitalization and ongoing recovery are:: To return home CMS Medicare.gov Compare Post Acute Care list provided to:: Other (Comment Required) (NA) Choice offered to / list presented to : NA [place] ownership interest in [facility].provided to:: Parent NA    Discharge Placement                   home    Discharge Plan and Services Additional resources added to the After Visit Summary for   In-house Referral: NA Discharge Planning Services: CM Consult Post Acute Care Choice: Durable Medical Equipment          DME Arranged: N/A DME Agency: NA       HH Arranged: NA HH Agency: NA        Social Drivers of Health (SDOH) Interventions SDOH Screenings   Food Insecurity: No Food Insecurity (06/23/2024)  Housing: Low Risk  (06/23/2024)  Transportation Needs: No Transportation Needs (06/23/2024)  Utilities: Not At Risk (06/23/2024)  Alcohol Screen: Low Risk  (04/23/2024)  Depression (PHQ2-9): Low Risk  (04/24/2024)  Financial Resource Strain: Low Risk  (04/23/2024)  Physical Activity: Insufficiently Active (04/23/2024)  Social Connections: Moderately Isolated (06/23/2024)  Stress: No Stress Concern Present (04/23/2024)  Tobacco Use: Low Risk  (06/23/2024)  Health Literacy: Adequate Health Literacy (03/26/2024)     Readmission Risk Interventions    06/26/2024    8:55 AM  Readmission Risk Prevention Plan  Transportation Screening Complete   PCP or Specialist Appt within 5-7 Days Complete  Home Care Screening Complete  Medication Review (RN CM) Complete

## 2024-06-29 ENCOUNTER — Telehealth: Payer: Self-pay | Admitting: Internal Medicine

## 2024-06-29 ENCOUNTER — Other Ambulatory Visit (HOSPITAL_COMMUNITY): Payer: Self-pay

## 2024-06-29 ENCOUNTER — Telehealth: Payer: Self-pay | Admitting: *Deleted

## 2024-06-29 ENCOUNTER — Telehealth: Payer: Self-pay | Admitting: Pharmacy Technician

## 2024-06-29 ENCOUNTER — Encounter: Payer: Self-pay | Admitting: Internal Medicine

## 2024-06-29 ENCOUNTER — Telehealth: Payer: Self-pay | Admitting: Cardiology

## 2024-06-29 ENCOUNTER — Ambulatory Visit: Attending: Internal Medicine | Admitting: Internal Medicine

## 2024-06-29 VITALS — BP 134/66 | HR 92 | Ht 65.0 in | Wt 239.0 lb

## 2024-06-29 DIAGNOSIS — I480 Paroxysmal atrial fibrillation: Secondary | ICD-10-CM | POA: Insufficient documentation

## 2024-06-29 NOTE — Telephone Encounter (Signed)
 Pharmacy Patient Advocate Encounter   Received notification from Pt Calls Messages that prior authorization for multaq  is required/requested.   Insurance verification completed.   The patient is insured through [employer] .   Per test claim: PA required; PA submitted to above mentioned insurance via Latent Key/confirmation #/EOC [id number] Status is pending   Pharmacy Patient Advocate Encounter  Received notification from [employer] that Prior Authorization for multaq  has been APPROVED from 05/30/24 to 06/29/25. Ran test claim, Copay is $373.49. This test claim was processed through [facility]- copay amounts may vary at other pharmacies due to pharmacy/plan contracts, or as the patient moves through the different stages of their insurance plan.   PA #/Case ID/Reference #: [id number]   Patient has pap to fill out

## 2024-06-29 NOTE — Telephone Encounter (Signed)
 Pt c/o medication issue:  1. Name of Medication: amiodarone  (PACERONE ) 200 MG tablet   2. How are you currently taking this medication (dosage and times per day)?   3. Are you having a reaction (difficulty breathing--STAT)? Na   4. What is your medication issue?  Pt called and stated this med is making her feet and up her leg numb.  She feels like her heart is racing.   Pt stated that her bp is going up and down. She stated the any time she moves around her HR is over 100.  She has a headache every morning.  She state she did not feel this way before she went into the hosp.    Best number [phone]

## 2024-06-29 NOTE — Telephone Encounter (Signed)
 Patient was in the hospital over the weekend. States that she sent Dr. Okey a mychart message. Requested that she be seen asap. Advise her of dr okey next available appt as well as NP/PA, patient wasn't satisfied. Ask that I sent a message back to have nurse give her a call back. Please advise

## 2024-06-29 NOTE — Telephone Encounter (Signed)
 Patient saw Dr Okey today--06/29/24

## 2024-06-29 NOTE — Patient Instructions (Addendum)
 Medication Instructions:  Your physician recommends that you continue on your current medications as directed. Please refer to the Current Medication list given to you today.  *If you need a refill on your cardiac medications before your next appointment, please call your pharmacy*  Follow-Up: At [facility], you and your health needs are our priority.  As part of our continuing mission to provide you with exceptional heart care, our providers are all part of one team.  This team includes your primary Cardiologist (physician) and Advanced Practice Providers or APPs (Physician Assistants and Nurse Practitioners) who all work together to provide you with the care you need, when you need it.  Your next appointment:   To be determined  Provider:   Vina Gull, MD  We recommend signing up for the patient portal called MyChart.  Sign up information is provided on this After Visit Summary.  MyChart is used to connect with patients for Virtual Visits (Telemedicine).  Patients are able to view lab/test results, encounter notes, upcoming appointments, etc.  Non-urgent messages can be sent to your provider as well.    To learn more about what you can do with MyChart, go to [email or web address].

## 2024-06-29 NOTE — Patient Instructions (Signed)
 Visit Information  Thank you for taking time to visit with me today. Please don't hesitate to contact me if I can be of assistance to you before our next scheduled telephone appointment.  Our next appointment is by telephone on Tuesday, July 07, 2024 at 10:00 am  Please call the care guide team at [phone] if you need to cancel or reschedule your appointment.   Patient Self Care Activities:  Attend all scheduled provider appointments Call provider office for new concerns or questions  Participate in Transition of Care Program/Attend TOC scheduled calls Take medications as prescribed   Continue monitoring and recording your blood pressures and daily weights at home, and take all recorded values with you to your scheduled doctor appointments Follow the weight-gain guidelines and action plan to call your doctor if you gain more than 3 lbs overnight, or 5 lbs in one week Use assistive devices as needed to prevent falls- your cane and/ or walker Continue to eat a heart healthy and low salt diet If you believe your condition is getting worse- contact your care providers (doctors) promptly- reaching out to your doctor early when you have concerns can prevent you from having to go to the hospital  Following is a copy of your care plan:   Goals Addressed             This Visit's Progress    VBCI Transitions of Care (TOC) Care Plan   On track    Problems:  Recent Hospitalization for treatment of CHF/ new onset junctional rhythm New medication instructions post- hospital discharge Recent hospitalization September 9-12, 2025 for junctional rhythm; CHF exacerbation (1) unplanned hospitalization x last (6) and (12) months Independent at baseline: resides with supportive and helpful spouse  Goal:  Over the next 30 days, the patient will not experience hospital readmission  Interventions:  Transitions of Care: Durable Medical Equipment (DME) needs assessed with  patient/caregiver Doctor Visits  - discussed the importance of doctor visits Communication with PCP re: enrollment into [facility] 30-day program Post discharge activity limitations prescribed by provider reviewed Confirmed not currently requiring/ using assistive devices for ambulation- has cane/ walker for prn use due to my bad knee; reports to have knee replacement sometime in the future;  provided education/ reinforcement around fall prevention  Provided education around benefit of conservative post-hospital discharge activity; need to pace activity without over-doing  Provided my direct contact information should questions/ concerns/ needs arise post-TOC call, prior to next RN CM telephone visit    Heart Failure Interventions: Basic overview and discussion of pathophysiology of Heart Failure reviewed Provided education on low sodium diet Assessed need for readable accurate scales in home Provided education about placing scale on hard, flat surface Advised patient to weigh each morning after emptying bladder Discussed importance of daily weight and advised patient to weigh and record daily Reviewed role of diuretics in prevention of fluid overload and management of heart failure; Discussed the importance of keeping all appointments with provider Provided patient with education about the role of exercise in the management of heart failure Screening for signs and symptoms of depression related to chronic disease state  Assessed social determinant of health barriers  Provided education re: rationale for daily weight monitoring at home along with weight gain guidelines/ action plan for weight gain; importance of taking diuretic as prescribed; confirmed patient currently monitoring/ recording daily weights at home; confirmed she continues monitoring blood pressures at home: reviewed both with patient; instructed patient to take all recorded  weights/ blood pressures form home to upcoming provider  office visit Reviewed cardiology provider office visit from earlier today: verbalizes good understanding of post-visit instructions without additional prompting and denies questions post- visit  Reinforced/ provided education around basics of following heart healthy low salt diet Provided education re: signs and symptoms other than weight gain that indicate fluid retention  Patient Self Care Activities:  Attend all scheduled provider appointments Call provider office for new concerns or questions  Participate in Transition of Care Program/Attend TOC scheduled calls Take medications as prescribed   Continue monitoring and recording your blood pressures and daily weights at home, and take all recorded values with you to your scheduled doctor appointments Follow the weight-gain guidelines and action plan to call your doctor if you gain more than 3 lbs overnight, or 5 lbs in one week Use assistive devices as needed to prevent falls- your cane and/ or walker Continue to eat a heart healthy and low salt diet If you believe your condition is getting worse- contact your care providers (doctors) promptly- reaching out to your doctor early when you have concerns can prevent you from having to go to the hospital  Plan:  Telephone follow up appointment with care management team member scheduled for:  Tuesday, July 07, 2024 at 10:00 am        Patient verbalizes understanding of instructions and care plan provided today and agrees to view in [place]. Active MyChart status and patient understanding of how to access instructions and care plan via MyChart confirmed with patient.     Telephone follow up appointment with care management team member scheduled for: 07/07/24 at 10:00 am, as scheduled  If you are experiencing a Mental Health or Behavioral Health Crisis or need someone to talk to, please  call the Suicide and Crisis Lifeline: 988 call the [place]  National Suicide Prevention Lifeline:  301-022-9821 or TTY: (979)393-0725 TTY (214) 367-4931) to talk to a trained counselor call 1-800-273-TALK (toll free, 24 hour hotline) go to [facility] [address] [place] [phone]) call the [facility] Crisis Line: 716-834-9658 call 911   Beatris Blinda Lawrence, RN, BSN, CCRN Alumnus RN Care Manager  Transitions of Care  VBCI - Population Health  [place] [phone]: direct office

## 2024-06-29 NOTE — Transitions of Care (Post Inpatient/ED Visit) (Signed)
 06/29/2024  Name: Amanda Wilkins MRN: [id number] DOB: Jan 22, 1946  Today's TOC FU Call Status: Today's TOC FU Call Status:: Successful TOC FU Call Completed TOC FU Call Complete Date: 06/29/24 Patient's Name and Date of Birth confirmed.  Transition Care Management Follow-up Telephone Call Date of Discharge: 06/26/24 Discharge Facility: [facility]) Type of Discharge: Inpatient Admission Primary Inpatient Discharge Diagnosis:: Junctional rhythm; bradycardia; hyponatremia; in setting of CHF How have you been since you were released from the hospital?: Same (I am having a hard time getting used to these new medications; they are making me feel funny and not like my normal self, kind of wobbly.  The heart doctor said today, that is common and I will eventually get used to them.  Other than that, I am fine) Any questions or concerns?: No  Items Reviewed: Did you receive and understand the discharge instructions provided?: Yes (thoroughly reviewed with patient who verbalizes good understanding of same) Medications obtained,verified, and reconciled?: Yes (Medications Reviewed) (Full medication reconciliation/ review completed; no concerns or discrepancies identified; confirmed patient obtained/ is taking all newly Rx'd medications as instructed; self-manages medications and denies questions/ concerns around medications today) Any new allergies since your discharge?: No Dietary orders reviewed?: Yes Type of Diet Ordered:: Heart Healthy Do you have support at home?: Yes People in Home [RPT]: spouse Name of Support/Comfort Primary Source: Reports independent in self-care activities; resides with supportive spouse assists as/ if needed/ indicated  Medications Reviewed Today: Medications Reviewed Today     Reviewed by Thamas Appleyard M, RN (Registered Nurse) on 06/29/24 at 1317  Med List Status: <None>   Medication Order Taking? Sig Documenting Provider Last Dose Status Informant   acetaminophen  (TYLENOL ) 500 MG tablet [id number] Yes Take 1,000 mg by mouth 2 (two) times daily as needed for moderate pain (pain score 4-6) or headache. [provider]  Active Self, Pharmacy Records  ALPRAZolam  (XANAX ) 0.25 MG tablet [id number] Yes Take 1 tablet (0.25 mg total) by mouth 2 (two) times daily as needed for anxiety. Plotnikov, Aleksei V, MD  Active Self, Pharmacy Records  amiodarone  (PACERONE ) 200 MG tablet [id number] Yes Take 1 tablet (200mg ) by mouth twice a day (morning and night) for 1 month, then reduce to 1 tablet once a day Riddle, Suzann, NP  Active   cholecalciferol  (VITAMIN D3) 25 MCG (1000 UT) tablet [id number] Yes Take 1,000 Units by mouth daily. [provider]  Active Self, Pharmacy Records  ELIQUIS  5 MG TABS tablet [id number] Yes TAKE 1 TABLET(5 MG) BY MOUTH TWICE DAILY Okey Vina [name], MD  Active Self, Pharmacy Records  famotidine  (PEPCID ) 20 MG tablet [id number] Yes TAKE 1 TABLET(20 MG) BY MOUTH AT BEDTIME Plotnikov, Aleksei V, MD  Active Self, Pharmacy Records  fluticasone  (FLONASE ) 50 MCG/ACT nasal spray [id number] Yes Place 2 sprays into both nostrils daily as needed for allergies or rhinitis. [provider]  Active Self, Pharmacy Records  furosemide  (LASIX ) 40 MG tablet [id number] Yes Take 40 mg by mouth daily as needed for fluid or edema. [provider]  Active Self, Pharmacy Records  hydrALAZINE  (APRESOLINE ) 10 MG tablet [id number] Yes Take 1 tablet (10 mg total) by mouth every 8 (eight) hours. Caleen Burgess BROCKS, MD  Active   HYDROcodone -acetaminophen  (NORCO/VICODIN) 5-325 MG tablet [id number] Yes Take 1 tablet by mouth every 6 (six) hours as needed for severe pain (pain score 7-10). Plotnikov, Aleksei V, MD  Active Self, Pharmacy Records  hydroxychloroquine  (PLAQUENIL ) 200 MG tablet [id number] Yes Take 200 mg by  mouth daily. [provider]  Active Self, Pharmacy Records           Med Note (COFFELL, ANGELA M   Wed Jun 24, 2024 10:03  AM) Patient confirms reducing frequency to QD about 3 months ago - has not notified her MD of this but plans to at next appointment.  levalbuterol  (XOPENEX  HFA) 45 MCG/ACT inhaler [id number] Yes Inhale 2 puffs into the lungs every 6 (six) hours as needed for wheezing. Desai, Nikita S, MD  Active Self, Pharmacy Records           Med Note (CRUTHIS, CHLOE C   Tue Jun 23, 2024  1:23 PM)    lidocaine -prilocaine  (EMLA ) cream [id number] Yes Apply 1 Application topically daily as needed (pain). [provider]  Active Self, Pharmacy Records  loratadine  (CLARITIN ) 10 MG tablet [id number] Yes Take 10 mg by mouth daily as needed for allergies. [provider]  Active Self, Pharmacy Records  potassium chloride  (KLOR-CON ) 10 MEQ tablet [id number] Yes Take 2 tablets by mouth daily Nahser, Aleene PARAS, MD  Active Self, Pharmacy Records  rosuvastatin  (CRESTOR ) 5 MG tablet [id number] Yes Take 1 tablet (5 mg total) by mouth daily. Plotnikov, Aleksei V, MD  Active Self, Pharmacy Records  sertraline  (ZOLOFT ) 50 MG tablet [id number] Yes TAKE 1 TABLET BY MOUTH DAILY Plotnikov, Aleksei V, MD  Active Self, Pharmacy Records  tirzepatide  (ZEPBOUND ) 2.5 MG/0.5ML injection vial [id number] Yes Inject 2.5 mg into the skin once a week. Plotnikov, Karlynn [name], MD  Active Self, Pharmacy Records  triamcinolone  ointment (KENALOG ) 0.1 % [id number] Yes Apply 1 Application topically 2 (two) times daily as needed (skin irritation). [provider]  Active Self, Pharmacy Records           Home Care and Equipment/Supplies: Were Home Health Services Ordered?: No Any new equipment or medical supplies ordered?: No  Functional Questionnaire: Do you need assistance with bathing/showering or dressing?: No (spouse supervises/  assists as indicated) Do you need assistance with meal preparation?: Yes (spouse assists as indicated) Do you need assistance with eating?: No Do you have difficulty maintaining continence: No Do you  need assistance with getting out of bed/getting out of a chair/moving?: No Do you have difficulty managing or taking your medications?: No  Follow up appointments reviewed: PCP Follow-up appointment confirmed?: Yes Date of PCP follow-up appointment?: 07/01/24 Follow-up Provider: PCP- Dr. Garald Specialist [facility] Follow-up appointment confirmed?: Yes Date of Specialist follow-up appointment?: 06/29/24 Follow-Up Specialty Provider:: Cardiology provider: confirmed attended appointment with Dr. Okey earlier today, as scheduled Do you need transportation to your follow-up appointment?: No Do you understand care options if your condition(s) worsen?: Yes-patient verbalized understanding  SDOH Interventions Today    Flowsheet Row Most Recent Value  SDOH Interventions   Food Insecurity Interventions Intervention Not Indicated  Housing Interventions Intervention Not Indicated  Transportation Interventions Intervention Not Indicated  [drives self at baseline,  spouse assisting as indicated post- recent hospital discharge]  Utilities Interventions Intervention Not Indicated   See TOC assessment tabs for additional assessment/ TOC intervention information  Successfully enrolled into 30-day TOC program: provided my direct contact information should questions/ concerns/ needs arise post-TOC initial call, prior to next [facility] 30-day program RN CM telephone visit    Pls call/ message for questions,  Beatris Blinda Lawrence, RN, BSN, CCRN Alumnus RN Care Manager  Transitions of Care  VBCI - [facility] [phone]: direct office

## 2024-06-29 NOTE — Telephone Encounter (Signed)
 Pt dropped off medication paperwork for Dr. Okey to fill out.   Location: Dr. Okey mailbox

## 2024-06-29 NOTE — Telephone Encounter (Signed)
 Spoke with patient to add to Dr. Okey' schedule this morning at 10:00 AM (OK per Dr. Okey). Patient accepted appt.

## 2024-06-29 NOTE — Telephone Encounter (Signed)
 Patient seeing Dr Okey at 10 AM today

## 2024-06-29 NOTE — Progress Notes (Unsigned)
 Cardiology Office Note   Date:  06/29/2024   ID:  Amanda Wilkins, DOB 1945-10-17, MRN [id number]  PCP:  Garald Karlynn [name], MD  Cardiologist:   Vina Gull, MD   Pt presents for follow up of HTN, PAF/Flutter     History of Present Illness: Amanda Wilkins is a 78 y.o. female with a history of CP, DOE, HTN, atrial fib/flutter (paroxysmal; on Eliquis  and Flecanide), mild CV dz,, aortic atherosclerosis, HL, OSA (on CPAP), Chronic LE edema, obesity, DJD   2016  Myoview showed inferolateral and inferior defect consistent with ischemia    2019  Atrial flutter  2020   CT calcium  score 0 PFTs done for dysnea  Normal Aug 2021  CPX   Limitation felt due to body weight.   Pt enrolled at [facility] and felt much better for awhile  Nov 2022  First documented atrial fibrillation  Rx flecanide  Cardioversion.  Reverted to Afib  Flecanide does increased and pt reverted to SR on own        July 2024 She has had problems with intermitt LE edema as well as SOB   PT admitted to increase salt intake    Fall 2024- Lasix  dose adjusted due to increased LE edema  I saw the pt last in clinic in Nov 2024   In Dec she called in with  low BP readings   Meds   held Jan 2025  Pt seen in ER for afib alerts from Apple Watch for afib with RVR  Watch showed SR.  Not sure if converted before recording    Seen by FORBES Pouch in cardiology clinic in Jan 2025 Echo (normal) and Zio patch (normal SR, no afib)   The pt presented to ER on 06/23/24 with dizziness, presyncope when getting labs  EKG showed atrial fib then junctional  escape HR 38 bpm   She was admitted to hospital   Flecanide and metoprolol  stopped PT seen by EP  Amiodarone  started (200 bid)   Plan for follow up in EP  Since d/c she denies dizziness   She does say she feels her heart flutter   No CP   Some LE edema   Allergies:   Epipen  2-pak [epinephrine ], Vibramycin  [doxycycline ], Bayer aspirin  [aspirin ], Benadryl  [diphenhydramine ], Cefdinir , Covid-19  ad26 vaccine(janssen), Fish allergy , Hylan g-f 20, Penicillins, Tape, and Wound dressing adhesive   Past Medical History:  Diagnosis Date   Acute bronchitis 09/11/2016   11/17 refractory   Acute cystitis without hematuria 05/17/2015   Acute kidney injury (HCC)    Labs today   Acute right ankle pain 09/09/2018   Anticoagulant long-term use    Xarelto    Axillary pain    Bronchitis    Cellulitis    Cholecystitis    Chronic interstitial nephritis    CONJUNCTIVITIS, ACUTE 10/18/2010   Qualifier: Diagnosis of  By: Plotnikov MD, Karlynn [name]    Coronary artery disease    Mild   D-dimer, elevated    Depression    Dysrhythmia    A. Fib/A. Flutter, AVT   Eye inflammation    GERD (gastroesophageal reflux disease)    History of interstitial nephritis 2016   chronic   History of kidney stones    has a small one found on xray   History of nuclear stress test 12/16/2014   Intermediate risk nuclear study w/ medium size moderate severity reversible defect in the basal and mid inferolateral and inferior wall (per dr cardiology note , dr vina  Haydan Mansouri did not think this was consistent with ischemia)/  normal LV function and wall motion, ef 75%   History of septic shock 01/22/2015   in setting Group A Strep Cellulits erysipelas/chest wall induration with streptoccocus basterium-- Severe sepsis, DIC, Acute respiratory failure with pulmonary edema, Acute Kidney failure with chronic interstitial nephritis   Hypertension    Hyponatremia    Migraines    on Zoloft  for migraines   Mild carotid artery disease (HCC)    per duplex 08-30-2017 bilateral ICA 1-39%   OA (osteoarthritis) rheumotologist-  dr jon jacob   both knees,  shoulders, ankles   OSA on CPAP     moderate obstructive sleep apnea with an AHI of 23.4/h and oxygen  desaturations as low as 84%.  Now on CPAP at 12 cm H2O.   PAF (paroxysmal atrial fibrillation) [facility]) 2009   cardiologist-- dr vina gull   Paroxysmal atrial flutter [facility])    a.  dx 11/2017.   PSVT (paroxysmal supraventricular tachycardia) (HCC)    RA (rheumatoid arthritis) (HCC)    Wears contact lenses     Past Surgical History:  Procedure Laterality Date   BREAST BIOPSY Right 05/15/2017   PASH   CARDIOVERSION N/A 08/28/2021   Procedure: CARDIOVERSION;  Surgeon: gull vina [name], MD;  Location: [facility] ENDOSCOPY;  Service: Cardiovascular;  Laterality: N/A;   CESAREAN SECTION  1980   CHOLECYSTECTOMY N/A 11/16/2021   Procedure: LAPAROSCOPIC CHOLECYSTECTOMY;  Surgeon: Rubin Calamity, MD;  Location: [facility] OR;  Service: General;  Laterality: N/A;   COLONOSCOPY     ESOPHAGOGASTRODUODENOSCOPY N/A 02/08/2015   Procedure: ESOPHAGOGASTRODUODENOSCOPY (EGD);  Surgeon: Lamar JONETTA Aho, MD;  Location[facility] ENDOSCOPY;  Service: Endoscopy;  Laterality: N/A;   KNEE ARTHROSCOPY W/ MENISCAL REPAIR Left 08/2014    @[facility]    SHOULDER SURGERY Right 04/04/2016   TOTAL HIP ARTHROPLASTY Right 06/19/2022   Procedure: RIGHT TOTAL HIP ARTHROPLASTY ANTERIOR APPROACH;  Surgeon: Vernetta Lonni GRADE, MD;  Location: [facility] OR;  Service: Orthopedics;  Laterality: Right;   TOTAL KNEE ARTHROPLASTY Right 09/01/2018   Procedure: RIGHT TOTAL KNEE ARTHROPLASTY;  Surgeon: Rubie Kemps, MD;  Location: [facility] ORS;  Service: Orthopedics;  Laterality: Right;     Social History:  The patient  reports that she has never smoked. She has never used smokeless tobacco. She reports current alcohol use of about 5.0 standard drinks of alcohol per week. She reports that she does not use drugs.   Family History:  The patient's family history includes Allergic rhinitis in her sister; Breast cancer (age of onset: 41) in her sister; Lymphoma in her mother; Pancreatic cancer in her brother; Prostate cancer in her father; Urticaria in her sister.    ROS:  Please see the history of present illness. All other systems are reviewed and  Negative to the above problem except as noted.    PHYSICAL EXAM: VS:  BP 134/66 (BP Location: Right  Arm, Patient Position: Sitting, Cuff Size: Large)   Pulse 92   Ht 5' 5 (1.651 m)   Wt 239 lb (108.4 kg)   BMI 39.77 kg/m   GEN: Obese 78 yo  in no acute distress  HEENT: normal  Neck: JVP is not elevatd   Cardiac: RRR; no murmurs   Respiratory:  clear to auscultation GI: soft, nontender.  No hepatomegaly  Ext  Triv LE edema   EKG:  EKG is not done today.    Event monitor Jan 2025  Impression:  Sinus rytm  Rate 46 to 102 bpm  Average HR 61 bpm   Rare PAC, rare PVC. Bursts of SVT, longest and fastest was 12 min 21 sec at 138 bpm maximal rate (average 97 bpm)     Triggered events correlated with sinus rhythm and sinus rhythm with PACs plus  SVT.   Echo  Dec 2024   1. Left ventricular ejection fraction, by estimation, is 60 to 65%. The  left ventricle has normal function. The left ventricle has no regional  wall motion abnormalities. Diastolic dysfunction is not assessed due  suboptimal images - no tissue doppler.   2. Right ventricular systolic function grossly normal. The right  ventricular size is grossly normal. There is normal pulmonary artery  systolic pressure.   3. The mitral valve was not well visualized. Trivial mitral valve  regurgitation. No evidence of mitral stenosis.   4. The aortic valve was not well visualized.   5. The inferior vena cava is normal in size with greater than 50%  respiratory variability, suggesting right atrial pressure of 3 mmHg.   Comparison(s): A prior study was performed on 10/30/2022. Reported LVEF  55-60%, grade 2 diastolic dysfunction, RVSP , moderate LAE and RAE,  mild mR, mild to moderate TR, estimated RAP 3 mmHg.     Lipid Panel    Component Value Date/Time   CHOL 168 03/06/2022 1143   TRIG 52 03/06/2022 1143   HDL 98 03/06/2022 1143   CHOLHDL 1.7 03/06/2022 1143   CHOLHDL 2 05/28/2018 0837   VLDL 13.8 05/28/2018 0837   LDLCALC 59 03/06/2022 1143      Wt Readings from Last 3 Encounters:  06/29/24 239 lb (108.4  kg)  06/29/24 239 lb (108.4 kg)  06/24/24 238 lb 8.6 oz (108.2 kg)        ASSESSMENT AND PLAN   1PAF  PT just seen in hospital   Dizzy  EKG with junctional escape   B BLOcker and flecanide stopped  She is now on amiodarone  200 bid (Multaq  too pricey    She is feeling more fluttering now, probably because she is no longer on bblocker;  Amiodarone  also is not at a therapeutic level  Discussed with EP   Will try to get her into clinic sooner to discuss long term options Keep on amiodarone  and Eliquis       2  Hx  HFpEF  Volume status is OK      3   HTN   BP is fair   Will follow for now     5   Hx CP  No known CAD.  Pt denies   6 CV dz  Mild plaquing   Rx risk factors     7 HL  Keep on Crestor     LDL 62 in May 2024       Current medicines are reviewed at length with the patient today.  The patient does not have concerns regarding medicines.  Signed, Vina Gull, MD  06/29/2024 11:11 PM    [facility] [address] [place], [place], [place]  [address] Phone: [phone]; Fax: [phone]

## 2024-06-30 ENCOUNTER — Other Ambulatory Visit (HOSPITAL_COMMUNITY): Payer: Self-pay

## 2024-07-01 ENCOUNTER — Encounter: Payer: Self-pay | Admitting: Pulmonary Disease

## 2024-07-01 ENCOUNTER — Ambulatory Visit: Admitting: Internal Medicine

## 2024-07-01 ENCOUNTER — Encounter: Payer: Self-pay | Admitting: Internal Medicine

## 2024-07-01 ENCOUNTER — Other Ambulatory Visit (HOSPITAL_COMMUNITY): Payer: Self-pay

## 2024-07-01 ENCOUNTER — Ambulatory Visit: Attending: Cardiology | Admitting: Pulmonary Disease

## 2024-07-01 VITALS — BP 140/82 | HR 75 | Temp 98.0°F | Ht 65.0 in | Wt 236.8 lb

## 2024-07-01 VITALS — BP 155/74 | HR 92 | Resp 16 | Ht 65.0 in | Wt 236.8 lb

## 2024-07-01 DIAGNOSIS — D6869 Other thrombophilia: Secondary | ICD-10-CM | POA: Insufficient documentation

## 2024-07-01 DIAGNOSIS — I480 Paroxysmal atrial fibrillation: Secondary | ICD-10-CM | POA: Diagnosis not present

## 2024-07-01 DIAGNOSIS — M069 Rheumatoid arthritis, unspecified: Secondary | ICD-10-CM

## 2024-07-01 DIAGNOSIS — I4892 Unspecified atrial flutter: Secondary | ICD-10-CM | POA: Diagnosis not present

## 2024-07-01 DIAGNOSIS — Z8679 Personal history of other diseases of the circulatory system: Secondary | ICD-10-CM | POA: Diagnosis not present

## 2024-07-01 DIAGNOSIS — E871 Hypo-osmolality and hyponatremia: Secondary | ICD-10-CM

## 2024-07-01 DIAGNOSIS — R55 Syncope and collapse: Secondary | ICD-10-CM

## 2024-07-01 DIAGNOSIS — I4811 Longstanding persistent atrial fibrillation: Secondary | ICD-10-CM

## 2024-07-01 DIAGNOSIS — M06071 Rheumatoid arthritis without rheumatoid factor, right ankle and foot: Secondary | ICD-10-CM

## 2024-07-01 DIAGNOSIS — Z79899 Other long term (current) drug therapy: Secondary | ICD-10-CM | POA: Insufficient documentation

## 2024-07-01 MED ORDER — AMIODARONE HCL 200 MG PO TABS: 200.0000 mg | ORAL_TABLET | Freq: Every day | ORAL | 3 refills | Status: DC

## 2024-07-01 NOTE — Progress Notes (Signed)
 Electrophysiology Office Note:   Date:  07/01/2024  ID:  Amanda Wilkins, DOB 27-Aug-1946, MRN [id number]  Primary Cardiologist: Vina Gull, MD Primary Heart Failure: None Electrophysiologist: None      History of Present Illness:   Amanda Wilkins is a 78 y.o. female with h/o AF, AFL, HTN, HLD, mild MR, chronic edema, interstitial nephritis, OSA on CPAP seen today for post hospital electrophysiology followup.   Admitted 9/9-9/12/25 for lightheadedness, dizziness and pre-syncopal episode.  Her metoprolol  had been increased in the preceding months. She denies med mixup with flecainide . She was found on admit to have a junctional bradycardia with HR 38 bpm. Flecainide  + Metoprolol  stopped due to bradycardia. She was noted to have freq PAC/ blocked PAC's on telemetry and AT 120-130's. Initially prescribed Multaq  but was too expensive and transitioned to Amiodarone . Brief discussion for AF ablation but planned to discuss further as outpatient.   Since last being seen in our clinic the patient reports she is doing ok. Feels she was released from the hospital too soon.  Her blood pressure has been somewhat elevated off metoprolol .  She states she is pending a knee surgery, timing to be determined.     She denies chest pain, palpitations, dyspnea, PND, orthopnea, nausea, vomiting, dizziness, syncope, edema, weight gain, or early satiety.   Review of systems complete and found to be negative unless listed in HPI.   EP Information / Studies Reviewed:    EKG is ordered today. Personal review as below.  EKG Interpretation Date/Time:  Wednesday July 01 2024 10:55:29 EDT Ventricular Rate:  92 PR Interval:  216 QRS Duration:  86 QT Interval:  370 QTC Calculation: 457 R Axis:   47  Text Interpretation: Sinus rhythm with 1st degree A-V block Confirmed by Aniceto Jarvis ([id number]) on 07/01/2024 11:17:02 AM    Arrhythmia / AAD / Pertinent EP Studies AF  AFL Flecainide  + Metoprolol  >  stopped due to bradycardia   Amiodarone  > initiated 06/25/24    Risk Assessment/Calculations:    CHA2DS2-VASc Score = 5   This indicates a 7.2% annual risk of stroke. The patient's score is based upon: CHF History: 0 HTN History: 1 Diabetes History: 0 Stroke History: 0 Vascular Disease History: 1 Age Score: 2 Gender Score: 1    HYPERTENSION CONTROL Vitals:   07/01/24 1055 07/01/24 1440  BP: (!) 149/82 (!) 155/74    The patient's blood pressure is elevated above target today.  In order to address the patient's elevated BP: Blood pressure will be monitored at home to determine if medication changes need to be made.           Physical Exam:   VS:  BP (!) 155/74   Pulse 92   Resp 16   Ht 5' 5 (1.651 m)   Wt 236 lb 12.8 oz (107.4 kg)   SpO2 97%   BMI 39.41 kg/m    Wt Readings from Last 3 Encounters:  07/01/24 236 lb 12.8 oz (107.4 kg)  06/29/24 239 lb (108.4 kg)  06/29/24 239 lb (108.4 kg)     GEN: Well nourished, well developed in no acute distress NECK: No JVD; No carotid bruits CARDIAC: Regular rate and rhythm, no murmurs, rubs, gallops RESPIRATORY:  Clear to auscultation without rales, wheezing or rhonchi  ABDOMEN: Soft, non-tender, non-distended EXTREMITIES:  No edema; No deformity   ASSESSMENT AND PLAN:    Paroxysmal Atrial Fibrillation  AFL  AT High Risk Medication Monitoring - Amiodarone   CHA2DS2-VASc 5 -OAC for stroke  prophylaxis  -continue amiodarone , Rx sent for 200 mg daily (only had one month Rx) -update amio labs in 1 month > baseline LFT's wnl, TSH 4.090   -EKG with NSR, stable intervals   -reviewed ablation procedure with patient and husband.  Given her pending knee surgery, discussed that an ablation is superior to medications but she would have to have uninterrupted OAC for 3 months after and to delay consideration until after her knee surgery.   Secondary Hypercoagulable State  -continue Eliquis  5mg  BID, dose reviewed and appropriate by  age / wt   Hypertension  -BP elevated in clinic, pt encouraged to monitor at home and if consistently >140 in the coming 1-2 weeks to notify Dr. Okey    Follow up with Dr. Inocencio 4 months   Signed, Daphne Barrack, NP-C, AGACNP-BC [facility] - Electrophysiology  07/01/2024, 2:40 PM

## 2024-07-01 NOTE — Progress Notes (Signed)
 Subjective:  Patient ID: Amanda Wilkins, female    DOB: May 24, 1946  Age: 78 y.o. MRN: [id number]  CC: Hospitalization Follow-up (Patient concerned about her blood pressure, hospital changed her bp medication and it is still running high. )   HPI Amanda Wilkins presents for HTN, atrial fibrillation, medication side effects  F/u n hospital stay for bradycardia:  Per hx: Admit date: 06/23/2024 Discharge date: 06/26/2024   Admitted From: Home Disposition: Home   Recommendations for Outpatient Follow-up:  Follow up with PCP in 1-2 weeks Please obtain BMP/CBC in one week your next doctors visit.  Discontinue beta-blocker and flecainide  Multaq  was cost prohibitive therefore started on amiodarone  Outpatient cardiology/EP appointment to be arranged by their service     Discharge Condition: Stable CODE STATUS: Full Diet recommendation: Low-salt   Brief/Interim Summary: Brief Narrative:    78 year old female history of chronic interstitial nephritis, bronchitis, AKI, prior history of septic shock due to cellulitis, cholecystitis, depression, GERD, hypertension, hyponatremia, migraine headaches, OSA on CPAP, PSVT, paroxysmal A-fib on anticoagulation with history of cardioversion presented to the ED with lightheadedness and dizziness and presyncopal episode while getting preop lab work done. Initial EKG noted patient to be in A-fib however patient also noted to develop a junctional bradycardia with heart rates of 38 bpm. Patient seen by cardiology who recommended transfer to [facility] for evaluation by EP.  AV nodal blockers were adjusted, flecainide  and beta-blocker was discontinued. Will discharge the patient once we have final recommendations from EP team today.   Assessment & Plan:   Presyncope Paroxysmal atrial fibrillation/flutter/atrial tachycardia Junctional bradycardia -Previously patient has been on flecainide , Lopressor  and Eliquis  presented for preop knee  surgery.  Seen by cardiology team.  Currently flecainide  and beta-blocker has been stopped.  EP initially recommended Multaq  but it was cost prohibitive therefore now on amiodarone .    Hyponatremia -Suspected from hypovolemia improved with fluids.   Hypertension -Beta-blockers discontinued.  Currently on hydralazine  3 times daily, will adjust as necessary.   Chronic heart failure with preserved ejection fraction/HFpEF -Appears to be euvolemic.  Currently home medications/diuretics are on hold due due to hyponatremia   OSA -CPAP nightly.   Rheumatoid arthritis -Continue home regimen hydroxychloroquine .   Hyperlipidemia -Continue statin.    Mixed anxiety and depressive disorder -Continue home regimen sertraline  50 mg daily, alprazolam  0.25 mg twice daily as needed.     DVT prophylaxis: Eliquis  Code Status: Full Family Communication:  Disposition: Discharge today once we have final recommendations from EP team regarding cardiac medications     Consultants:  Cardiology   Procedures:  Chest x-ray 06/23/2024 2D echo preserved EF 60%     PT Follow up Recs:    Subjective: Doing well no complaints Examination:   General exam: Appears calm and comfortable  Respiratory system: Clear to auscultation. Respiratory effort normal. Cardiovascular system: S1 & S2 heard, RRR. No JVD, murmurs, rubs, gallops or clicks. No pedal edema. Gastrointestinal system: Abdomen is nondistended, soft and nontender. No organomegaly or masses felt. Normal bowel sounds heard. Central nervous system: Alert and oriented. No focal neurological deficits. Extremities: Symmetric 5 x 5 power. Skin: No rashes, lesions or ulcers Psychiatry: Judgement and insight appear normal. Mood & affect appropriate.     Discharge Diagnoses:  Principal Problem:   Junctional rhythm Active Problems:   Hyponatremia   OSA on CPAP   Primary hypertension   RA (rheumatoid arthritis) (HCC)   HLD (hyperlipidemia)   PAF  (paroxysmal atrial fibrillation) (HCC)   Mixed  anxiety and depressive disorder   Tachycardia-bradycardia syndrome (HCC)   PAT (paroxysmal atrial tachycardia) (HCC)   Chronic diastolic CHF (congestive heart failure) (HCC)   Bradycardia   SVT (supraventricular tachycardia) (HCC)           Discharge Exam:     Vitals:    06/26/24 0608 06/26/24 0745  BP:   (!) 120/53  Pulse:      Resp: 19 17  Temp:   98.4 F (36.9 C)  SpO2:   98%          Vitals:    06/26/24 0400 06/26/24 0524 06/26/24 0608 06/26/24 0745  BP:   (!) 164/74   (!) 120/53  Pulse: 68 70      Resp: 16 19 19 17   Temp:   98.1 F (36.7 C)   98.4 F (36.9 C)  TempSrc:   Oral   Oral  SpO2: 100% 98%   98%  Weight:          Height:                 Outpatient Medications Prior to Visit  Medication Sig Dispense Refill   acetaminophen  (TYLENOL ) 500 MG tablet Take 1,000 mg by mouth 2 (two) times daily as needed for moderate pain (pain score 4-6) or headache.     ALPRAZolam  (XANAX ) 0.25 MG tablet Take 1 tablet (0.25 mg total) by mouth 2 (two) times daily as needed for anxiety. 60 tablet 3   [START ON 07/27/2024] amiodarone  (PACERONE ) 200 MG tablet Take 1 tablet (200 mg total) by mouth daily. 90 tablet 3   cholecalciferol  (VITAMIN D3) 25 MCG (1000 UT) tablet Take 1,000 Units by mouth daily.     ELIQUIS  5 MG TABS tablet TAKE 1 TABLET(5 MG) BY MOUTH TWICE DAILY 60 tablet 5   famotidine  (PEPCID ) 20 MG tablet TAKE 1 TABLET(20 MG) BY MOUTH AT BEDTIME 90 tablet 3   furosemide  (LASIX ) 40 MG tablet Take 40 mg by mouth daily as needed for fluid or edema.     hydrALAZINE  (APRESOLINE ) 10 MG tablet Take 1 tablet (10 mg total) by mouth every 8 (eight) hours. 90 tablet 0   HYDROcodone -acetaminophen  (NORCO/VICODIN) 5-325 MG tablet Take 1 tablet by mouth every 6 (six) hours as needed for severe pain (pain score 7-10). 20 tablet 0   hydroxychloroquine  (PLAQUENIL ) 200 MG tablet Take 200 mg by mouth daily.     levalbuterol  (XOPENEX  HFA) 45  MCG/ACT inhaler Inhale 2 puffs into the lungs every 6 (six) hours as needed for wheezing. 1 each 12   loratadine  (CLARITIN ) 10 MG tablet Take 10 mg by mouth daily as needed for allergies.     potassium chloride  (KLOR-CON ) 10 MEQ tablet Take 2 tablets by mouth daily 180 tablet 3   rosuvastatin  (CRESTOR ) 5 MG tablet Take 1 tablet (5 mg total) by mouth daily. 90 tablet 3   sertraline  (ZOLOFT ) 50 MG tablet TAKE 1 TABLET BY MOUTH DAILY 90 tablet 1   No facility-administered medications prior to visit.    ROS: Review of Systems  Constitutional:  Positive for fatigue. Negative for activity change, appetite change, chills and unexpected weight change.  HENT:  Negative for congestion, mouth sores and sinus pressure.   Eyes:  Negative for visual disturbance.  Respiratory:  Negative for cough, chest tightness and wheezing.   Cardiovascular:  Positive for palpitations. Negative for leg swelling.  Gastrointestinal:  Negative for abdominal pain and nausea.  Genitourinary:  Negative for difficulty urinating, frequency  and vaginal pain.  Musculoskeletal:  Negative for back pain and gait problem.  Skin:  Negative for pallor and rash.  Neurological:  Negative for dizziness, tremors, weakness, numbness and headaches.  Psychiatric/Behavioral:  Negative for confusion and sleep disturbance.     Objective:  BP (!) 140/82   Pulse 75   Temp 98 F (36.7 C) (Oral)   Ht 5' 5 (1.651 m)   Wt 236 lb 12.8 oz (107.4 kg)   SpO2 90%   BMI 39.41 kg/m   BP Readings from Last 3 Encounters:  07/01/24 (!) 140/82  07/01/24 (!) 155/74  06/29/24 134/64    Wt Readings from Last 3 Encounters:  07/01/24 236 lb 12.8 oz (107.4 kg)  07/01/24 236 lb 12.8 oz (107.4 kg)  06/29/24 239 lb (108.4 kg)    Physical Exam Constitutional:      General: She is not in acute distress.    Appearance: She is well-developed.  HENT:     Head: Normocephalic.     Right Ear: External ear normal.     Left Ear: External ear normal.      Nose: Nose normal.  Eyes:     General:        Right eye: No discharge.        Left eye: No discharge.     Conjunctiva/sclera: Conjunctivae normal.     Pupils: Pupils are equal, round, and reactive to light.  Neck:     Thyroid : No thyromegaly.     Vascular: No JVD.     Trachea: No tracheal deviation.  Cardiovascular:     Rate and Rhythm: Normal rate and regular rhythm.     Heart sounds: Normal heart sounds.  Pulmonary:     Effort: No respiratory distress.     Breath sounds: No stridor. No wheezing.  Abdominal:     General: Bowel sounds are normal. There is no distension.     Palpations: Abdomen is soft. There is no mass.     Tenderness: There is no abdominal tenderness. There is no guarding or rebound.  Musculoskeletal:        General: No tenderness.     Cervical back: Normal range of motion and neck supple. No rigidity.  Lymphadenopathy:     Cervical: No cervical adenopathy.  Skin:    Findings: No erythema or rash.  Neurological:     Cranial Nerves: No cranial nerve deficit.     Motor: No abnormal muscle tone.     Coordination: Coordination normal.     Deep Tendon Reflexes: Reflexes normal.  Psychiatric:        Behavior: Behavior normal.        Thought Content: Thought content normal.        Judgment: Judgment normal.     Lab Results  Component Value Date   WBC 6.3 06/24/2024   HGB 12.4 06/24/2024   HCT 36.3 06/24/2024   PLT 155 06/24/2024   GLUCOSE 101 (H) 06/25/2024   CHOL 168 03/06/2022   TRIG 52 03/06/2022   HDL 98 03/06/2022   LDLCALC 59 03/06/2022   ALT 31 06/24/2024   AST 30 06/24/2024   NA 130 (L) 06/25/2024   K 4.0 06/25/2024   CL 99 06/25/2024   CREATININE 0.77 06/25/2024   BUN 8 06/25/2024   CO2 19 (L) 06/25/2024   TSH 4.090 06/23/2024   INR 1.1 07/07/2022   HGBA1C 5.7 10/30/2023    DG Chest Port 1 View Result Date: 06/23/2024 CLINICAL DATA:  Chest pain. EXAM: PORTABLE CHEST 1 VIEW COMPARISON:  04/24/2024. FINDINGS: The heart size and  mediastinal contours are unchanged. Defibrillator pad overlies the left chest wall. Aortic atherosclerosis. No focal consolidation, pleural effusion, or pneumothorax. No acute osseous abnormality. IMPRESSION: No acute cardiopulmonary findings. Electronically Signed   By: Harrietta Sherry M.D.   On: 06/23/2024 11:39    Assessment & Plan:   Problem List Items Addressed This Visit     Hyponatremia   Recurrent.  Will recheck blood chemistry      Longstanding persistent atrial fibrillation (HCC) - Primary   Currently flecainide  and beta-blocker has been stopped.  EP initially recommended Multaq  but it was cost prohibitive therefore now on amiodarone .  Knee surgery was postponed        RA (rheumatoid arthritis) (HCC)   On hydroxychloroquine       RESOLVED: SYNCOPE   No relapse over near syncope at home after discharge         No orders of the defined types were placed in this encounter.     Follow-up: Return in about 3 months (around 09/30/2024) for a follow-up visit.  Marolyn Noel, MD

## 2024-07-01 NOTE — Patient Instructions (Addendum)
 Medication Instructions:    STARTING OCTOBER : TAKE AMIODARONE   200 MG ONCE A DAY  ( OCTOBER 13 )  *If you need a refill on your cardiac medications before your next appointment, please call your pharmacy*   Lab Work:   PLEASE GO DOWN STAIRS  [facility]  FIRST FLOOR   ( GET OFF ELEVATORS WALK TOWARDS WAITING AREA LAB LOCATED BY PHARMACY): CMET  TSH AND FREE T4    RETURN IN ONE  MONTH    If you have labs (blood work) drawn today and your tests are completely normal, you will receive your results only by: MyChart Message (if you have MyChart) OR A paper copy in the mail If you have any lab test that is abnormal or we need to change your treatment, we will call you to review the results.    Testing/Procedures: NONE ORDERED  TODAY     Follow-Up: At [facility], you and your health needs are our priority.  As part of our continuing mission to provide you with exceptional heart care, our providers are all part of one team.  This team includes your primary Cardiologist (physician) and Advanced Practice Providers or APPs (Physician Assistants and Nurse Practitioners) who all work together to provide you with the care you need, when you need it.   Your next appointment:  4 month(s) Provider:   You may see  Daphne Barrack, NP    We recommend signing up for the patient portal called MyChart.  Sign up information is provided on this After Visit Summary.  MyChart is used to connect with patients for Virtual Visits (Telemedicine).  Patients are able to view lab/test results, encounter notes, upcoming appointments, etc.  Non-urgent messages can be sent to your provider as well.   To learn more about what you can do with MyChart, go to [email or web address].   Other Instructions

## 2024-07-02 ENCOUNTER — Telehealth: Payer: Self-pay | Admitting: Pulmonary Disease

## 2024-07-02 ENCOUNTER — Encounter: Payer: Self-pay | Admitting: Internal Medicine

## 2024-07-02 NOTE — Telephone Encounter (Signed)
 See other phone note in regards to the same concerns. Message has been left for patient to call back. Will send message to patient via MyChart to follow up on.

## 2024-07-02 NOTE — Telephone Encounter (Signed)
 Pt c/o medication issue:  1. Name of Medication:   amiodarone  (PACERONE ) 200 MG tablet    2. How are you currently taking this medication (dosage and times per day)?   Take 1 tablet (200 mg total) by mouth daily.    3. Are you having a reaction (difficulty breathing--STAT)? Yes  4. What is your medication issue? Pt states that she is having vision issues due to above medication; she states they are larger than floaters and they hover in the vision of her right eye.

## 2024-07-02 NOTE — Telephone Encounter (Signed)
 Patient started medication 6 days ago. I sent secure chat to pharmacist,V.Patel. how long is he on amiodarone - corneal deposit takes some time - meaning when pt use it for long term   I attempted to reach patient and got voicemail.  I see she has history of ocular migraines also. We need to determine if this is sudden onset, when was her last eye exam and does she have any other neurologic symptoms to advise ED visit tonight.

## 2024-07-03 ENCOUNTER — Telehealth: Payer: Self-pay | Admitting: Internal Medicine

## 2024-07-03 ENCOUNTER — Telehealth: Payer: Self-pay | Admitting: Pulmonary Disease

## 2024-07-03 NOTE — Telephone Encounter (Signed)
 Received a call from patient she stated she has been noticing on her apple watch her heart rate is bouncing up and down 80 to 150. B/P 148/83,130/78,109/69,90/76,130/80 At present 124/91.Stated she feels ok except she has a shadow that comes across right eye.Stated she noticed shadow this past Wed night.She has appointment with eye Dr on Monday 9/22.Patient reassured with afib her heart rate will be irregular.Advised to make sure heart rate does not stay 150.She stated at present heart rate 91.Advised she will need to go to ED if eye becomes worse.I will make Amanda Barrack NP aware.

## 2024-07-03 NOTE — Telephone Encounter (Signed)
 Patient called regarding concerns with floater in one eye.  Reviewed with Dr. Inocencio, unlikely related to her amiodarone  as has been on it for one week.  However, encouraged her to be seen by her eye doctor as soon as possible for exam.  In terms of the amiodarone , we will reduce her to 200mg  daily and monitor.  Our hope was for her to be on amiodarone  short term as a bridge to ablation.  If we have to abandon amiodarone , she has an acceptable QTc in SR for Tikosyn and creatinine clearance of 131mL/min.  Medication list would have to be reviewed and discussion with patient for loading process.    Daphne Barrack, NP-C, AGACNP-BC [facility] - Electrophysiology  07/03/2024, 10:47 AM

## 2024-07-03 NOTE — Telephone Encounter (Signed)
 Spoke with pt regarding her vision problem. Pt stated she started amiodarone  on 9/17 and that evening she had a sudden occurrence of a visual disturbance. Pt stated she cannot easily explain what she is seeing but she stated when she looks straight ahead she sees a glob of dancing matter. Pt stated she called the eye doctor yesterday to make an appointment but no one called her back. Pt stated her husband did some research on amiodarone  and they believe her eye issues are from the medication. Pt was told that eye problems are usually an issue after long term use. Pt scoffed and stated she could be an outlier. Pt was told that our pharmacists would be contacted regarding her issue for their suggestions. Pt was told to go to the ED if her symptoms worsen. Pt was also advised to follow up with eye doctor. Pt verbalized understanding. All questions if any were answered.

## 2024-07-03 NOTE — Telephone Encounter (Signed)
See MyChart messages from 9/18

## 2024-07-03 NOTE — Telephone Encounter (Signed)
 Pt c/o BP issue: STAT if pt c/o blurred vision, one-sided weakness or slurred speech.  STAT if BP is GREATER than 180/120 TODAY.  STAT if BP is LESS than 90/60 and SYMPTOMATIC TODAY  1. What is your BP concern? High for her   2. Have you taken any BP medication today? Yes   3. What are your last 5 BP readings?124/90 136/91   4. Are you having any other symptoms (ex. Dizziness, headache, blurred vision, passed out)? Blurred vision in right eye   STAT if HR is under 50 or over 120 (normal HR is 60-100 beats per minute)  What is your heart rate? 150 about an hour ago  Do you have a log of your heart rate readings (document readings)? Yes   Do you have any other symptoms? Headache

## 2024-07-05 NOTE — Assessment & Plan Note (Signed)
 On hydroxychloroquine.

## 2024-07-05 NOTE — Assessment & Plan Note (Signed)
 Recurrent.  Will recheck blood chemistry

## 2024-07-05 NOTE — Assessment & Plan Note (Signed)
 No relapse over near syncope at home after discharge

## 2024-07-05 NOTE — Assessment & Plan Note (Signed)
 Currently flecainide  and beta-blocker has been stopped.  EP initially recommended Multaq  but it was cost prohibitive therefore now on amiodarone .  Knee surgery was postponed

## 2024-07-06 DIAGNOSIS — H43391 Other vitreous opacities, right eye: Secondary | ICD-10-CM | POA: Diagnosis not present

## 2024-07-07 ENCOUNTER — Emergency Department (HOSPITAL_COMMUNITY)

## 2024-07-07 ENCOUNTER — Other Ambulatory Visit: Payer: Self-pay

## 2024-07-07 ENCOUNTER — Other Ambulatory Visit: Payer: Self-pay | Admitting: *Deleted

## 2024-07-07 ENCOUNTER — Emergency Department (HOSPITAL_COMMUNITY)
Admission: EM | Admit: 2024-07-07 | Discharge: 2024-07-08 | Disposition: A | Discharge status: Home / Self Care | Attending: Emergency Medicine | Admitting: Emergency Medicine

## 2024-07-07 DIAGNOSIS — R002 Palpitations: Secondary | ICD-10-CM | POA: Diagnosis present

## 2024-07-07 DIAGNOSIS — I2510 Atherosclerotic heart disease of native coronary artery without angina pectoris: Secondary | ICD-10-CM | POA: Diagnosis not present

## 2024-07-07 DIAGNOSIS — I10 Essential (primary) hypertension: Secondary | ICD-10-CM | POA: Diagnosis not present

## 2024-07-07 DIAGNOSIS — J479 Bronchiectasis, uncomplicated: Secondary | ICD-10-CM | POA: Diagnosis not present

## 2024-07-07 DIAGNOSIS — I4891 Unspecified atrial fibrillation: Secondary | ICD-10-CM | POA: Insufficient documentation

## 2024-07-07 DIAGNOSIS — Z7901 Long term (current) use of anticoagulants: Secondary | ICD-10-CM | POA: Insufficient documentation

## 2024-07-07 DIAGNOSIS — Z79899 Other long term (current) drug therapy: Secondary | ICD-10-CM | POA: Diagnosis not present

## 2024-07-07 DIAGNOSIS — J42 Unspecified chronic bronchitis: Secondary | ICD-10-CM | POA: Diagnosis not present

## 2024-07-07 LAB — TROPONIN I (HIGH SENSITIVITY): Troponin I (High Sensitivity): 14 ng/L (ref <18)

## 2024-07-07 LAB — BASIC METABOLIC PANEL WITH GFR
Anion gap: 15 (ref 5–15)
BUN: 12 mg/dL (ref 8–23)
CO2: 18 mmol/L — ABNORMAL LOW (ref 22–32)
Calcium: 9.6 mg/dL (ref 8.9–10.3)
Chloride: 94 mmol/L — ABNORMAL LOW (ref 98–111)
Creatinine, Ser: 0.88 mg/dL (ref 0.44–1.00)
GFR, Estimated: >60 mL/min (ref >60)
Glucose, Bld: 110 mg/dL — ABNORMAL HIGH (ref 70–99)
Potassium: 5.1 mmol/L (ref 3.5–5.1)
Sodium: 127 mmol/L — ABNORMAL LOW (ref 135–145)

## 2024-07-07 LAB — CBC
HCT: 39.9 % (ref 36.0–46.0)
Hemoglobin: 13.7 g/dL (ref 12.0–15.0)
MCH: 32.7 pg (ref 26.0–34.0)
MCHC: 34.3 g/dL (ref 30.0–36.0)
MCV: 95.2 fL (ref 80.0–100.0)
Platelets: 177 K/uL (ref 150–400)
RBC: 4.19 MIL/uL (ref 3.87–5.11)
RDW: 12.4 % (ref 11.5–15.5)
WBC: 6.1 K/uL (ref 4.0–10.5)
nRBC: 0 % (ref 0.0–0.2)

## 2024-07-07 LAB — MAGNESIUM: Magnesium: 2.1 mg/dL (ref 1.7–2.4)

## 2024-07-07 MED ORDER — AMIODARONE IV BOLUS ONLY 150 MG/100ML
150.0000 mg | Freq: Once | INTRAVENOUS | Status: AC
  Administered 2024-07-07: 150 mg via INTRAVENOUS
  Filled 2024-07-07: qty 100

## 2024-07-07 MED ORDER — METOPROLOL TARTRATE 25 MG PO TABS
25.0000 mg | ORAL_TABLET | Freq: Once | ORAL | Status: AC
  Administered 2024-07-07: 25 mg via ORAL
  Filled 2024-07-07: qty 1

## 2024-07-07 NOTE — ED Provider Notes (Signed)
 11:29 PM Assumed care from Dr. Franklyn, please see their note for full history, physical and decision making until this point. In brief this is a 78 y.o. year old female who presented to the ED tonight with Irregular Heart Beat     pAfib recently from metoprolol  to amio after a bradycardic event, Cards recommends amio load, has had intermittent afib rvr, small dose of PO metop at low dose. If maintains NSR here can fu.   Patient stable in normal sinus rhythm for a few hours without any episodes of A-fib.  Will start low-dose metoprolol  and Amio as per previous provider plan.  Follow-up with A-fib clinic for continued monitoring.  Discharge instructions, including strict return precautions for new or worsening symptoms, given. Patient and/or family verbalized understanding and agreement with the plan as described.   Labs, studies and imaging reviewed by myself and considered in medical decision making if ordered. Imaging interpreted by radiology.  Labs Reviewed  BASIC METABOLIC PANEL WITH GFR - Abnormal; Notable for the following components:      Result Value   Sodium 127 (*)    Chloride 94 (*)    CO2 18 (*)    Glucose, Bld 110 (*)    All other components within normal limits  CBC  MAGNESIUM   TROPONIN I (HIGH SENSITIVITY)  TROPONIN I (HIGH SENSITIVITY)    DG Chest Port 1 View  Final Result      No follow-ups on file.    Amanda Wilkins, Selinda, MD 07/09/24 [phone]

## 2024-07-07 NOTE — ED Provider Notes (Signed)
 [facility] Provider Note   CSN: [id number] Arrival date & time: 07/07/24  2126     History {Add pertinent medical, surgical, social history, OB history to HPI:1} Chief Complaint  Patient presents with   Irregular Heart Beat    Amanda Wilkins is a 78 y.o. female with chronic interstitial nephritis, bronchitis, AKI, prior history of septic shock due to cellulitis, cholecystitis, depression, GERD, hypertension, hyponatremia, migraine headaches, OSA on CPAP, PSVT, paroxysmal A-fib on anticoagulation with history of cardioversion  who presents with palpitations. She stated it has been intermittent since she was seen for bradycardia and they stopped her metoprolol /flecainide  and started her on amiodarone  . Was admitted from 06/23/24-06/26/24 for ***   Past Medical History:  Diagnosis Date   Acute bronchitis 09/11/2016   11/17 refractory   Acute cystitis without hematuria 05/17/2015   Acute kidney injury    Labs today   Acute right ankle pain 09/09/2018   Anticoagulant long-term use    Xarelto    Axillary pain    Bronchitis    Cellulitis    Cholecystitis    Chronic interstitial nephritis    CONJUNCTIVITIS, ACUTE 10/18/2010   Qualifier: Diagnosis of  By: Plotnikov MD, Karlynn [name]    Coronary artery disease    Mild   D-dimer, elevated    Depression    Dysrhythmia    A. Fib/A. Flutter, AVT   Eye inflammation    GERD (gastroesophageal reflux disease)    History of interstitial nephritis 2016   chronic   History of kidney stones    has a small one found on xray   History of nuclear stress test 12/16/2014   Intermediate risk nuclear study w/ medium size moderate severity reversible defect in the basal and mid inferolateral and inferior wall (per dr cardiology note , dr vina gull did not think this was consistent with ischemia)/  normal LV function and wall motion, ef 75%   History of septic shock 01/22/2015   in setting Group A Strep  Cellulits erysipelas/chest wall induration with streptoccocus basterium-- Severe sepsis, DIC, Acute respiratory failure with pulmonary edema, Acute Kidney failure with chronic interstitial nephritis   Hypertension    Hyponatremia    Migraines    on Zoloft  for migraines   Mild carotid artery disease    per duplex 08-30-2017 bilateral ICA 1-39%   OA (osteoarthritis) rheumotologist-  dr jon jacob   both knees,  shoulders, ankles   OSA on CPAP     moderate obstructive sleep apnea with an AHI of 23.4/h and oxygen  desaturations as low as 84%.  Now on CPAP at 12 cm H2O.   PAF (paroxysmal atrial fibrillation) [facility]) 2009   cardiologist-- dr vina gull   Paroxysmal atrial flutter [facility])    a. dx 11/2017.   PSVT (paroxysmal supraventricular tachycardia)    RA (rheumatoid arthritis) (HCC)    Wears contact lenses        Home Medications Prior to Admission medications   Medication Sig Start Date End Date Taking? Authorizing Provider  acetaminophen  (TYLENOL ) 500 MG tablet Take 1,000 mg by mouth 2 (two) times daily as needed for moderate pain (pain score 4-6) or headache.    [provider]  ALPRAZolam  (XANAX ) 0.25 MG tablet Take 1 tablet (0.25 mg total) by mouth 2 (two) times daily as needed for anxiety. 10/30/23   Plotnikov, Karlynn [name], MD  amiodarone  (PACERONE ) 200 MG tablet Take 1 tablet (200 mg total) by mouth daily. 07/27/24  Aniceto Daphne CROME, NP  cholecalciferol  (VITAMIN D3) 25 MCG (1000 UT) tablet Take 1,000 Units by mouth daily.    [provider]  ELIQUIS  5 MG TABS tablet TAKE 1 TABLET(5 MG) BY MOUTH TWICE DAILY 05/11/24   Okey Vina [name], MD  famotidine  (PEPCID ) 20 MG tablet TAKE 1 TABLET(20 MG) BY MOUTH AT BEDTIME 01/20/24   Plotnikov, Aleksei V, MD  furosemide  (LASIX ) 40 MG tablet Take 40 mg by mouth daily as needed for fluid or edema.    [provider]  hydrALAZINE  (APRESOLINE ) 10 MG tablet Take 1 tablet (10 mg total) by mouth every 8 (eight) hours. 06/26/24    Amin, Burgess BROCKS, MD  HYDROcodone -acetaminophen  (NORCO/VICODIN) 5-325 MG tablet Take 1 tablet by mouth every 6 (six) hours as needed for severe pain (pain score 7-10). 02/12/24   Plotnikov, Aleksei V, MD  hydroxychloroquine  (PLAQUENIL ) 200 MG tablet Take 200 mg by mouth daily.    [provider]  levalbuterol  (XOPENEX  HFA) 45 MCG/ACT inhaler Inhale 2 puffs into the lungs every 6 (six) hours as needed for wheezing. 01/16/24   Meade Verdon RAMAN, MD  loratadine  (CLARITIN ) 10 MG tablet Take 10 mg by mouth daily as needed for allergies.    [provider]  potassium chloride  (KLOR-CON ) 10 MEQ tablet Take 2 tablets by mouth daily 07/19/23   Nahser, Aleene PARAS, MD  rosuvastatin  (CRESTOR ) 5 MG tablet Take 1 tablet (5 mg total) by mouth daily. 10/30/23   Plotnikov, Karlynn [name], MD  sertraline  (ZOLOFT ) 50 MG tablet TAKE 1 TABLET BY MOUTH DAILY 02/04/24   Plotnikov, Aleksei V, MD      Allergies    Epipen  2-pak [epinephrine ], Vibramycin  [doxycycline ], Bayer aspirin  [aspirin ], Benadryl  [diphenhydramine ], Cefdinir , Covid-19 ad26 vaccine(janssen), Fish allergy , Hylan g-f 20, Penicillins, Tape, and Wound dressing adhesive    Review of Systems   Review of Systems A 10 point review of systems was performed and is negative unless otherwise reported in HPI.  Physical Exam Updated Vital Signs BP (!) 190/84   Pulse 89   Resp 16   Ht 5' 5 (1.651 m)   Wt 105.6 kg   SpO2 100%   BMI 38.74 kg/m  Physical Exam General: Normal appearing {Desc; female/female:11659}, lying in bed.  HEENT: PERRLA, Sclera anicteric, MMM, trachea midline.  Cardiology: RRR, no murmurs/rubs/gallops. BL radial and DP pulses equal bilaterally.  Resp: Normal respiratory rate and effort. CTAB, no wheezes, rhonchi, crackles.  Abd: Soft, non-tender, non-distended. No rebound tenderness or guarding.  GU: Deferred. MSK: No peripheral edema or signs of trauma. Extremities without deformity or TTP. No cyanosis or clubbing. Skin: warm, dry.  No rashes or lesions. Back: No CVA tenderness Neuro: A&Ox4, CNs II-XII grossly intact. MAEs. Sensation grossly intact.  Psych: Normal mood and affect.   ED Results / Procedures / Treatments   Labs (all labs ordered are listed, but only abnormal results are displayed) Labs Reviewed  CBC  BASIC METABOLIC PANEL WITH GFR  MAGNESIUM   TROPONIN I (HIGH SENSITIVITY)    EKG EKG Interpretation Date/Time:  Tuesday July 07 2024 21:41:22 EDT Ventricular Rate:  115 PR Interval:  194 QRS Duration:  76 QT Interval:  318 QTC Calculation: 439 R Axis:   74  Text Interpretation: Sinus tachycardia Low voltage QRS Confirmed by Franklyn Gills [phone]) on 07/07/2024 9:43:07 PM  Radiology No results found.  Procedures Procedures  {Document cardiac monitor, telemetry assessment procedure when appropriate:1}  Medications Ordered in ED Medications - No data to display  ED  Course/ Medical Decision Making/ A&P                          Medical Decision Making Amount and/or Complexity of Data Reviewed Labs: ordered. Radiology: ordered.    This patient presents to the ED for concern of ***, this involves an extensive number of treatment options, and is a complaint that carries with it a high risk of complications and morbidity.  I considered the following differential and admission for this acute, potentially life threatening condition.   MDM:    ***  Clinical Course as of 07/07/24 2211  Tue Jul 07, 2024  2153 CBC wnl [HN]    Clinical Course User Index [HN] Franklyn Sid SAILOR, MD    Labs: I Ordered, and personally interpreted labs.  The pertinent results include:  ***  Imaging Studies ordered: I ordered imaging studies including *** I independently visualized and interpreted imaging. I agree with the radiologist interpretation  Additional history obtained from ***.  External records from outside source obtained and reviewed including ***  Cardiac Monitoring: The patient was  maintained on a cardiac monitor.  I personally viewed and interpreted the cardiac monitored which showed an underlying rhythm of: ***  Reevaluation: After the interventions noted above, I reevaluated the patient and found that they have :{resolved/improved/worsened:23923::improved}  Social Determinants of Health: ***  Disposition:  ***  Co morbidities that complicate the patient evaluation  Past Medical History:  Diagnosis Date   Acute bronchitis 09/11/2016   11/17 refractory   Acute cystitis without hematuria 05/17/2015   Acute kidney injury    Labs today   Acute right ankle pain 09/09/2018   Anticoagulant long-term use    Xarelto    Axillary pain    Bronchitis    Cellulitis    Cholecystitis    Chronic interstitial nephritis    CONJUNCTIVITIS, ACUTE 10/18/2010   Qualifier: Diagnosis of  By: Plotnikov MD, Karlynn [name]    Coronary artery disease    Mild   D-dimer, elevated    Depression    Dysrhythmia    A. Fib/A. Flutter, AVT   Eye inflammation    GERD (gastroesophageal reflux disease)    History of interstitial nephritis 2016   chronic   History of kidney stones    has a small one found on xray   History of nuclear stress test 12/16/2014   Intermediate risk nuclear study w/ medium size moderate severity reversible defect in the basal and mid inferolateral and inferior wall (per dr cardiology note , dr vina gull did not think this was consistent with ischemia)/  normal LV function and wall motion, ef 75%   History of septic shock 01/22/2015   in setting Group A Strep Cellulits erysipelas/chest wall induration with streptoccocus basterium-- Severe sepsis, DIC, Acute respiratory failure with pulmonary edema, Acute Kidney failure with chronic interstitial nephritis   Hypertension    Hyponatremia    Migraines    on Zoloft  for migraines   Mild carotid artery disease    per duplex 08-30-2017 bilateral ICA 1-39%   OA (osteoarthritis) rheumotologist-  dr jon jacob    both knees,  shoulders, ankles   OSA on CPAP     moderate obstructive sleep apnea with an AHI of 23.4/h and oxygen  desaturations as low as 84%.  Now on CPAP at 12 cm H2O.   PAF (paroxysmal atrial fibrillation) [facility]) 2009   cardiologist-- dr vina gull   Paroxysmal atrial flutter (HCC)  a. dx 11/2017.   PSVT (paroxysmal supraventricular tachycardia)    RA (rheumatoid arthritis) (HCC)    Wears contact lenses      Medicines No orders of the defined types were placed in this encounter.   I have reviewed the patients home medicines and have made adjustments as needed  Problem List / ED Course: Problem List Items Addressed This Visit   None        {Document critical care time when appropriate:1} {Document review of labs and clinical decision tools ie heart score, Chads2Vasc2 etc:1}  {Document your independent review of radiology images, and any outside records:1} {Document your discussion with family members, caretakers, and with consultants:1} {Document social determinants of health affecting pt's care:1} {Document your decision making why or why not admission, treatments were needed:1}  This note was created using dictation software, which may contain spelling or grammatical errors.

## 2024-07-07 NOTE — Transitions of Care (Post Inpatient/ED Visit) (Signed)
 Transition of Care week 2/ day # 8  Visit Note  07/07/2024  Name: Amanda Wilkins MRN: [id number]          DOB: [birth year]  Situation: Patient enrolled in [facility] 30-day program. Visit completed with patient by telephone.   HIPAA identifiers x 2 verified  Background:  Recent hospitalization September 9-12, 2025 for junctional rhythm; CHF exacerbation (1) unplanned hospitalization x last (6) and (12) months Independent at baseline: resides with supportive and helpful spouse  Initial Transition Care Management Follow-up Telephone Call    Past Medical History:  Diagnosis Date   Acute bronchitis 09/11/2016   11/17 refractory   Acute cystitis without hematuria 05/17/2015   Acute kidney injury    Labs today   Acute right ankle pain 09/09/2018   Anticoagulant long-term use    Xarelto    Axillary pain    Bronchitis    Cellulitis    Cholecystitis    Chronic interstitial nephritis    CONJUNCTIVITIS, ACUTE 10/18/2010   Qualifier: Diagnosis of  By: Plotnikov MD, Karlynn [name]    Coronary artery disease    Mild   D-dimer, elevated    Depression    Dysrhythmia    A. Fib/A. Flutter, AVT   Eye inflammation    GERD (gastroesophageal reflux disease)    History of interstitial nephritis 2016   chronic   History of kidney stones    has a small one found on xray   History of nuclear stress test 12/16/2014   Intermediate risk nuclear study w/ medium size moderate severity reversible defect in the basal and mid inferolateral and inferior wall (per dr cardiology note , dr vina gull did not think this was consistent with ischemia)/  normal LV function and wall motion, ef 75%   History of septic shock 01/22/2015   in setting Group A Strep Cellulits erysipelas/chest wall induration with streptoccocus basterium-- Severe sepsis, DIC, Acute respiratory failure with pulmonary edema, Acute Kidney failure with chronic interstitial nephritis   Hypertension    Hyponatremia    Migraines    on Zoloft   for migraines   Mild carotid artery disease    per duplex 08-30-2017 bilateral ICA 1-39%   OA (osteoarthritis) rheumotologist-  dr jon jacob   both knees,  shoulders, ankles   OSA on CPAP     moderate obstructive sleep apnea with an AHI of 23.4/h and oxygen  desaturations as low as 84%.  Now on CPAP at 12 cm H2O.   PAF (paroxysmal atrial fibrillation) [facility]) 2009   cardiologist-- dr vina gull   Paroxysmal atrial flutter [facility])    a. dx 11/2017.   PSVT (paroxysmal supraventricular tachycardia)    RA (rheumatoid arthritis) (HCC)    Wears contact lenses    Assessment:  I am much better and much less anxious than I was last week; went to all my doctors appointments and feel good about everything; now taking the amiodarone  like they told me- once daily; no weight gain, no swelling; heart rates still a little high, but they usually come right down when I rest or remove myself from the situation.  My doctors were happy with my progress, so I think I am doing what I need to get better    Denies clinical concerns and sounds to be in no distress throughout [facility] 30-day program outreach call today  Patient Reported Symptoms: Cognitive Cognitive Status: Normal speech and language skills, Alert and oriented to person, place, and time, Insightful and able to interpret abstract concepts Cognitive/Intellectual  Conditions Management [RPT]: None reported or documented in medical history or problem list      Neurological Neurological Review of Symptoms: No symptoms reported    HEENT HEENT Symptoms Reported: No symptoms reported      Cardiovascular Cardiovascular Symptoms Reported: Other: Other Cardiovascular Symptoms: Reports intermittent ongoing increased heart rate: unsure if related to anxiety: reports heart rates go up into 90's but then decreases back to 80's within minutes once this occurs: confirmed she has contacted cardiology provider to report; reviewed with patient recent cardiology provider  appointment 06/29/24- she verbalizes excellent understanding of same/ denies questions: confirmed no taking amiodarone  200 mg QD as per recent MyChart communication/ instruction; confirmed currently obtaining daily weights at home: provided reinforcement / rationale for daily weight monitoring at home as earliest indicator of fluid retention, along with weight gain guidelines/ action plan for weight gain; importance of taking diuretic as prescribed- confirmed has not needed to take prn diuretic: reports no weight gain, no lower extremity swelling: reviewed blood pressures from home monitoring Does patient have uncontrolled Hypertension?: No Cardiovascular Management Strategies: Routine screening, Diet modification, Coping strategies, Medication therapy, Adequate rest, Weight management Do You Have a Working Readable Scale?: Yes Weight: 232 lb 12.8 oz (105.6 kg) (home reported value from 07/07/24)  Respiratory Respiratory Symptoms Reported: No symptoms reported Other Respiratory Symptoms: Denies shortness of breath; sounds to be in no respiratory distress throughout [facility] call Respiratory Management Strategies: Weight management, Routine screening, Medication therapy, Adequate rest  Endocrine Endocrine Symptoms Reported: No symptoms reported Is patient diabetic?: No    Gastrointestinal Gastrointestinal Symptoms Reported: No symptoms reported Additional Gastrointestinal Details: Reports good appetite; normal and regular BM's Last BM reported 07/06/24 Gastrointestinal Management Strategies: Coping strategies    Genitourinary Genitourinary Symptoms Reported: No symptoms reported Additional Genitourinary Details: Reports peeing normally, clear yellow urine    Integumentary Integumentary Symptoms Reported: No symptoms reported    Musculoskeletal Musculoskelatal Symptoms Reviewed: No symptoms reported Additional Musculoskeletal Details: confirmed not currently requiring/ using assistive devices for  ambulation        Psychosocial Psychosocial Symptoms Reported: Other Other Psychosocial Conditions: Reports feeling better about everything this week; less anxious; able to be by myself more often without being anxious Behavioral Management Strategies: Coping strategies, Support system, Adequate rest, Medication therapy Major Change/Loss/Stressor/Fears (CP): Medical condition, self Techniques to Cope with Loss/Stress/Change: Diversional activities, Medication Quality of Family Relationships: helpful, involved, supportive   Vitals:   07/07/24 1000  BP: 124/81    Medications Reviewed Today     Reviewed by Arbie Reisz M, RN (Registered Nurse) on 07/07/24 at 1005  Med List Status: <None>   Medication Order Taking? Sig Documenting Provider Last Dose Status Informant  acetaminophen  (TYLENOL ) 500 MG tablet [id number]  Take 1,000 mg by mouth 2 (two) times daily as needed for moderate pain (pain score 4-6) or headache. [provider]  Active Self, Pharmacy Records  ALPRAZolam  (XANAX ) 0.25 MG tablet [id number]  Take 1 tablet (0.25 mg total) by mouth 2 (two) times daily as needed for anxiety. Plotnikov, Aleksei V, MD  Active Self, Pharmacy Records  amiodarone  (PACERONE ) 200 MG tablet [id number]  Take 1 tablet (200 mg total) by mouth daily. Aniceto Daphne CROME, NP  Active   cholecalciferol  (VITAMIN D3) 25 MCG (1000 UT) tablet [id number]  Take 1,000 Units by mouth daily. [provider]  Active Self, Pharmacy Records  ELIQUIS  5 MG TABS tablet [id number]  TAKE 1 TABLET(5 MG) BY MOUTH TWICE DAILY Okey Vina [name], MD  Active Self, Pharmacy Records  famotidine  (PEPCID ) 20 MG tablet [id number]  TAKE 1 TABLET(20 MG) BY MOUTH AT BEDTIME Plotnikov, Aleksei V, MD  Active Self, Pharmacy Records  furosemide  (LASIX ) 40 MG tablet [id number]  Take 40 mg by mouth daily as needed for fluid or edema. [provider]  Active Self, Pharmacy Records  hydrALAZINE  (APRESOLINE ) 10 MG tablet [id number]   Take 1 tablet (10 mg total) by mouth every 8 (eight) hours. Caleen Burgess BROCKS, MD  Active   HYDROcodone -acetaminophen  (NORCO/VICODIN) 5-325 MG tablet [id number]  Take 1 tablet by mouth every 6 (six) hours as needed for severe pain (pain score 7-10). Plotnikov, Aleksei V, MD  Active Self, Pharmacy Records  hydroxychloroquine  (PLAQUENIL ) 200 MG tablet [id number]  Take 200 mg by mouth daily. [provider]  Active Self, Pharmacy Records           Med Note (COFFELL, ANGELA M   Wed Jun 24, 2024 10:03 AM) Patient confirms reducing frequency to QD about 3 months ago - has not notified her MD of this but plans to at next appointment.  levalbuterol  (XOPENEX  HFA) 45 MCG/ACT inhaler [id number]  Inhale 2 puffs into the lungs every 6 (six) hours as needed for wheezing. Desai, Nikita S, MD  Active Self, Pharmacy Records           Med Note (CRUTHIS, CHLOE C   Tue Jun 23, 2024  1:23 PM)    loratadine  (CLARITIN ) 10 MG tablet [id number]  Take 10 mg by mouth daily as needed for allergies. [provider]  Active Self, Pharmacy Records  potassium chloride  (KLOR-CON ) 10 MEQ tablet [id number]  Take 2 tablets by mouth daily Nahser, Aleene PARAS, MD  Active Self, Pharmacy Records  rosuvastatin  (CRESTOR ) 5 MG tablet [id number]  Take 1 tablet (5 mg total) by mouth daily. Plotnikov, Aleksei V, MD  Active Self, Pharmacy Records  sertraline  (ZOLOFT ) 50 MG tablet [id number]  TAKE 1 TABLET BY MOUTH DAILY Plotnikov, Aleksei V, MD  Active Self, Pharmacy Records           Recommendation:   Continue Current Plan of Care  Follow Up Plan:   Telephone follow-up in 1 week- as scheduled 07/14/24  Pls call/ message for questions,  Beatris Blinda Lawrence, RN, BSN, CCRN Alumnus RN Care Manager  Transitions of Care  [facility] - [facility] [phone]: direct office

## 2024-07-07 NOTE — ED Triage Notes (Signed)
 Pt POV d/t high HR - has hx of Afib.

## 2024-07-07 NOTE — ED Notes (Signed)
 CCMD called.

## 2024-07-07 NOTE — Patient Instructions (Signed)
 Visit Information  Thank you for taking time to visit with me today. Please don't hesitate to contact me if I can be of assistance to you before our next scheduled telephone appointment.  Our next appointment is by telephone on Tuesday, July 14, 2024 at 10:30 am  Please call the care guide team at [phone] if you need to cancel or reschedule your appointment.   Following are the goals we discussed today:  Patient Self Care Activities:  Attend all scheduled provider appointments Call provider office for new concerns or questions  Participate in Transition of Care Program/Attend TOC scheduled calls Take medications as prescribed   Continue monitoring and recording your blood pressures and daily weights at home, and take all recorded values with you to your scheduled doctor appointments Follow the weight-gain guidelines and action plan to call your doctor if you gain more than 3 lbs overnight, or 5 lbs in one week Use assistive devices as needed to prevent falls- your cane and/ or walker Continue to eat a heart healthy and low salt diet If you believe your condition is getting worse- contact your care providers (doctors) promptly- reaching out to your doctor early when you have concerns can prevent you from having to go to the hospital  If you are experiencing a Mental Health or Behavioral Health Crisis or need someone to talk to, please  call the Suicide and Crisis Lifeline: 988 call the [place]  National Suicide Prevention Lifeline: 660-802-9981 or TTY: 859 413 6312 TTY 220-427-8045) to talk to a trained counselor call 1-800-273-TALK (toll free, 24 hour hotline) go to [facility] [address] [place] [phone]) call the [facility] Crisis Line: 226-543-9219 call 911   Patient verbalizes understanding of instructions and care plan provided today and agrees to view in MyChart. Active MyChart status and patient understanding of how  to access instructions and care plan via MyChart confirmed with patient.     Pls call/ message for questions,  Jeneen Doutt Mckinney Kerith Sherley, RN, BSN, CCRN Alumnus RN Care Manager  Transitions of Care  [facility] - [facility] [phone]: direct office

## 2024-07-08 ENCOUNTER — Encounter (HOSPITAL_COMMUNITY): Payer: Self-pay

## 2024-07-08 ENCOUNTER — Telehealth: Payer: Self-pay | Admitting: Internal Medicine

## 2024-07-08 DIAGNOSIS — I4891 Unspecified atrial fibrillation: Secondary | ICD-10-CM | POA: Diagnosis not present

## 2024-07-08 LAB — TROPONIN I (HIGH SENSITIVITY): Troponin I (High Sensitivity): 26 ng/L — ABNORMAL HIGH (ref <18)

## 2024-07-08 MED ORDER — METOPROLOL TARTRATE 25 MG PO TABS: 12.5000 mg | ORAL_TABLET | Freq: Two times a day (BID) | ORAL | Status: DC

## 2024-07-08 MED ORDER — APIXABAN 5 MG PO TABS
5.0000 mg | ORAL_TABLET | Freq: Once | ORAL | Status: AC
  Administered 2024-07-08: 5 mg via ORAL
  Filled 2024-07-08: qty 1

## 2024-07-08 NOTE — Telephone Encounter (Signed)
 Gwenlyn Durie to [facility] Triage (supporting Vina Okey [name], MD)     07/08/24  8:33 AM I had to go to the ER last night.  My heart rate was 170 and bp was extremely high.  They gave me metoprolol . Orally and amiodorone drip.  In about one hour it  lower the heart rate and bp.   I would like your advice on how to proceed with the meds.  Instructions from ER doctor is to take 12.5 of the metoprolol  twice a day and 1 pill a day of the amiodorone plus the high blood pressure medicine. 3 times a day. Please let me know .   Thank you, Helana Macbride  S/w patient and advised to continue taking medication as prescribed in the ER. Appt made with Daphne Barrack, NP for 07/09/24.

## 2024-07-08 NOTE — Telephone Encounter (Signed)
Patient has been seen in the ER 

## 2024-07-08 NOTE — Telephone Encounter (Signed)
 Pt requesting a c/b in regards to her MyChart message.

## 2024-07-09 ENCOUNTER — Ambulatory Visit: Attending: Pulmonary Disease | Admitting: Pulmonary Disease

## 2024-07-09 ENCOUNTER — Encounter: Payer: Self-pay | Admitting: Pulmonary Disease

## 2024-07-09 ENCOUNTER — Ambulatory Visit: Admitting: Pulmonary Disease

## 2024-07-09 ENCOUNTER — Ambulatory Visit (INDEPENDENT_AMBULATORY_CARE_PROVIDER_SITE_OTHER)

## 2024-07-09 VITALS — BP 126/78 | HR 81 | Ht 65.0 in | Wt 235.6 lb

## 2024-07-09 DIAGNOSIS — R000 Tachycardia, unspecified: Secondary | ICD-10-CM | POA: Insufficient documentation

## 2024-07-09 DIAGNOSIS — I480 Paroxysmal atrial fibrillation: Secondary | ICD-10-CM | POA: Diagnosis not present

## 2024-07-09 NOTE — Progress Notes (Unsigned)
Enrolled patient for a 14 day Zio XT monitor to be mailed to patients home  ° °[place] to read °

## 2024-07-09 NOTE — Patient Instructions (Addendum)
 Medication Instructions:  Your physician recommends that you continue on your current medications as directed. Please refer to the Current Medication list given to you today.  *If you need a refill on your cardiac medications before your next appointment, please call your pharmacy*  Lab Work: None ordered If you have labs (blood work) drawn today and your tests are completely normal, you will receive your results only by: MyChart Message (if you have MyChart) OR A paper copy in the mail If you have any lab test that is abnormal or we need to change your treatment, we will call you to review the results.  Testing/Procedures: GEOFFRY HEWS- Long Term Monitor Instructions  Your physician has requested you wear a ZIO patch monitor for 14 days.  This is a single patch monitor. [employer] supplies one patch monitor per enrollment. Additional stickers are not available. Please do not apply patch if you will be having a Nuclear Stress Test,  Echocardiogram, Cardiac CT, MRI, or Chest Xray during the period you would be wearing the  monitor. The patch cannot be worn during these tests. You cannot remove and re-apply the  ZIO XT patch monitor.  Your ZIO patch monitor will be mailed 3 day USPS to your address on file. It may take 3-5 days  to receive your monitor after you have been enrolled.  Once you have received your monitor, please review the enclosed instructions. Your monitor  has already been registered assigning a specific monitor serial # to you.  Billing and Patient Assistance Program Information  We have supplied [employer] with any of your insurance information on file for billing purposes. [employer] offers a sliding scale Patient Assistance Program for patients that do not have  insurance, or whose insurance does not completely cover the cost of the ZIO monitor.  You must apply for the Patient Assistance Program to qualify for this discounted rate.  To apply, please call [employer] at [phone],  select option 4, select option 2, ask to apply for  Patient Assistance Program. Meredeth will ask your household income, and how many people  are in your household. They will quote your out-of-pocket cost based on that information.  [employer] will also be able to set up a 51-month, interest-free payment plan if needed.  Applying the monitor   Shave hair from upper left chest.  Hold abrader disc by orange tab. Rub abrader in 40 strokes over the upper left chest as  indicated in your monitor instructions.  Clean area with 4 enclosed alcohol pads. Let dry.  Apply patch as indicated in monitor instructions. Patch will be placed under collarbone on left  side of chest with arrow pointing upward.  Rub patch adhesive wings for 2 minutes. Remove white label marked 1. Remove the white  label marked 2. Rub patch adhesive wings for 2 additional minutes.  While looking in a mirror, press and release button in center of patch. A small green light will  flash 3-4 times. This will be your only indicator that the monitor has been turned on.  Do not shower for the first 24 hours. You may shower after the first 24 hours.  Press the button if you feel a symptom. You will hear a small click. Record Date, Time and  Symptom in the Patient Logbook.  When you are ready to remove the patch, follow instructions on the last 2 pages of Patient  Logbook. Stick patch monitor onto the last page of Patient Logbook.  Place Patient Logbook in the  blue and white box. Use locking tab on box and tape box closed  securely. The blue and white box has prepaid postage on it. Please place it in the mailbox as  soon as possible. Your physician should have your test results approximately 7 days after the  monitor has been mailed back to [facility].  Call [facility] Customer Care at [phone] if you have questions regarding  your ZIO XT patch monitor. Call them immediately if you see an orange light blinking on your   monitor.  If your monitor falls off in less than 4 days, contact our Monitor department at [phone].  If your monitor becomes loose or falls off after 4 days call [employer] at [phone] for  suggestions on securing your monitor   Follow-Up: At [facility], you and your health needs are our priority.  As part of our continuing mission to provide you with exceptional heart care, our providers are all part of one team.  This team includes your primary Cardiologist (physician) and Advanced Practice Providers or APPs (Physician Assistants and Nurse Practitioners) who all work together to provide you with the care you need, when you need it.  Your next appointment:   6 week(s)-08/20/24 at 3:10 PM  Provider:   Daphne Barrack, NP

## 2024-07-09 NOTE — Progress Notes (Signed)
 Electrophysiology Office Note:   Date:  07/09/2024  ID:  Amanda Wilkins, DOB 22-Feb-1946, MRN [id number]  Primary Cardiologist: Vina Gull, MD Primary Heart Failure: None Electrophysiologist: Will Gladis Norton, MD      History of Present Illness:   Amanda Wilkins is a 78 y.o. female with h/o  AF, AFL, HTN, HLD, mild MR, chronic edema, interstitial nephritis, OSA on CPAP seen today for post hospital follow up.    Admitted 9/9-9/12/25 for lightheadedness, dizziness and pre-syncopal episode.  Her metoprolol  had been increased in the preceding months. She denies med mixup with flecainide . She was found on admit to have a junctional bradycardia with HR 38 bpm. Flecainide  + Metoprolol  stopped due to bradycardia. She was noted to have freq PAC/ blocked PAC's on telemetry and AT 120-130's. Initially prescribed Multaq  but was too expensive and transitioned to Amiodarone . Brief discussion for AF ablation but planned to discuss further as outpatient.   Seen in EP Clinic 07/01/24 and was doing largely well. She called back after that visit on 9/18 with vision changes / floaters.  Reviewed given short time on amiodarone  that it was unlikely to be causing floaters and to reduce amiodarone  to 200 mg daily. She was encouraged to have close follow up with her eye doctor. She called back to the clinic again on 9/19 with elevated HR's and HR bouncing from 80-150 bpm.   She was seen in the ER on 07/07/24 with elevated HR and BP.  She was re-loaded with IV amiodarone  and low dose metoprolol  was started.   Since discharge from hospital the patient reports she has episodes when she is walking down the hall where she feels a funny feeling in her chest > no associated nausea, chest pressure, SOB.  She feels the need to burp.  She did see eye MD and he noted vitreous opacities in the right eye (not typically associated with amiodarone ).    She denies chest pain, palpitations, dyspnea, PND, orthopnea, nausea,  vomiting, dizziness, syncope, edema, weight gain, or early satiety.   Review of systems complete and found to be negative unless listed in HPI.   EP Information / Studies Reviewed:    EKG is ordered today. Personal review as below.  EKG Interpretation Date/Time:  Thursday July 09 2024 15:59:15 EDT Ventricular Rate:  81 PR Interval:  202 QRS Duration:  90 QT Interval:  388 QTC Calculation: 450 R Axis:   35  Text Interpretation: Normal sinus rhythm Confirmed by Aniceto Jarvis ([id number]) on 07/09/2024 4:28:58 PM   Arrhythmia / AAD / Pertinent EP Studies AF  AFL Flecainide  + Metoprolol  > stopped due to bradycardia   Amiodarone  > initiated 06/25/24   Risk Assessment/Calculations:    CHA2DS2-VASc Score = 5   This indicates a 7.2% annual risk of stroke. The patient's score is based upon: CHF History: 0 HTN History: 1 Diabetes History: 0 Stroke History: 0 Vascular Disease History: 1 Age Score: 2 Gender Score: 1             Physical Exam:   VS:  BP 126/78   Pulse 81   Ht 5' 5 (1.651 m)   Wt 235 lb 9.6 oz (106.9 kg)   SpO2 99%   BMI 39.21 kg/m    Wt Readings from Last 3 Encounters:  07/09/24 235 lb 9.6 oz (106.9 kg)  07/07/24 232 lb 12.8 oz (105.6 kg)  07/07/24 232 lb 12.8 oz (105.6 kg)     GEN: Well nourished, well developed in no acute  distress NECK: No JVD; No carotid bruits CARDIAC: Regular rate and rhythm, no murmurs, rubs, gallops RESPIRATORY:  Clear to auscultation without rales, wheezing or rhonchi  ABDOMEN: Soft, non-tender, non-distended EXTREMITIES:  No edema; No deformity   ASSESSMENT AND PLAN:    Paroxysmal Atrial Fibrillation  AFL  AT High Risk Medication Monitoring - Amiodarone   CHA2DS2-VASc 5 -amiodarone  200 mg daily  -metoprolol  12.5 mg BID  -assess cardiac monitor for 2 weeks and follow up in clinic   Secondary Hypercoagulable State  -continue Eliquis  5mg  BID, dose reviewed and appropriate by age / wt   HTN  -per Cardiology     Follow up with Dr. Inocencio 6 weeks   Signed, Daphne Barrack, NP-C, AGACNP-BC [facility] - Electrophysiology  07/09/2024, 5:06 PM

## 2024-07-14 ENCOUNTER — Other Ambulatory Visit: Payer: Self-pay | Admitting: *Deleted

## 2024-07-14 ENCOUNTER — Other Ambulatory Visit: Payer: Self-pay | Admitting: Internal Medicine

## 2024-07-14 NOTE — Patient Instructions (Signed)
 Visit Information  Thank you for taking time to visit with me today. Please don't hesitate to contact me if I can be of assistance to you before our next scheduled telephone appointment.  Our next appointment is by telephone on Tuesday, July 21, 2024 at 10:30 am  Please call the care guide team at [phone] if you need to cancel or reschedule your appointment.   Following are the goals we discussed today:  Patient Self Care Activities:  Attend all scheduled provider appointments Call provider office for new concerns or questions  Participate in Transition of Care Program/Attend TOC scheduled calls Take medications as prescribed   Continue monitoring and recording your blood pressures and daily weights at home, and take all recorded values with you to your scheduled doctor appointments Follow the weight-gain guidelines and action plan to call your doctor if you gain more than 3 lbs overnight, or 5 lbs in one week Use assistive devices as needed to prevent falls- your cane and/ or walker Continue to eat a heart healthy and low salt diet If you believe your condition is getting worse- contact your care providers (doctors) promptly- reaching out to your doctor early when you have concerns can prevent you from having to go to the hospital  If you are experiencing a Mental Health or Behavioral Health Crisis or need someone to talk to, please  call the Suicide and Crisis Lifeline: 988 call the [place]  National Suicide Prevention Lifeline: 570 738 5771 or TTY: 530-554-3112 TTY 906-192-5105) to talk to a trained counselor call 1-800-273-TALK (toll free, 24 hour hotline) go to [facility] [address], [place] [phone]) call the [facility] Crisis Line: [phone] call 911   Patient verbalizes understanding of instructions and care plan provided today and agrees to view in MyChart. Active MyChart status and patient understanding of how to  access instructions and care plan via MyChart confirmed with patient.     Ernestyne Caldwell Mckinney Irven Ingalsbe, RN, BSN, Media planner  Transitions of Care  [facility] - [facility] [phone]: direct office

## 2024-07-14 NOTE — Transitions of Care (Post Inpatient/ED Visit) (Signed)
 Transition of Care week 3/ day # 15  Visit Note  07/14/2024  Name: Amanda Wilkins MRN: [id number]          DOB: Feb 26, 1946  Situation: Patient enrolled in [facility] 30-day program. Visit completed with patient by telephone.   HIPAA identifiers x 2 verified  Background:  Recent hospitalization September 9-12, 2025 for junctional rhythm; CHF exacerbation (1) unplanned hospitalization x last (6) and (12) months Independent at baseline: resides with supportive and helpful spouse  Initial Transition Care Management Follow-up Telephone Call    Past Medical History:  Diagnosis Date   Acute bronchitis 09/11/2016   11/17 refractory   Acute cystitis without hematuria 05/17/2015   Acute kidney injury    Labs today   Acute right ankle pain 09/09/2018   Anticoagulant long-term use    Xarelto    Axillary pain    Bronchitis    Cellulitis    Cholecystitis    Chronic interstitial nephritis    CONJUNCTIVITIS, ACUTE 10/18/2010   Qualifier: Diagnosis of  By: Plotnikov MD, Karlynn [name]    Coronary artery disease    Mild   D-dimer, elevated    Depression    Dysrhythmia    A. Fib/A. Flutter, AVT   Eye inflammation    GERD (gastroesophageal reflux disease)    History of interstitial nephritis 2016   chronic   History of kidney stones    has a small one found on xray   History of nuclear stress test 12/16/2014   Intermediate risk nuclear study w/ medium size moderate severity reversible defect in the basal and mid inferolateral and inferior wall (per dr cardiology note , dr vina gull did not think this was consistent with ischemia)/  normal LV function and wall motion, ef 75%   History of septic shock 01/22/2015   in setting Group A Strep Cellulits erysipelas/chest wall induration with streptoccocus basterium-- Severe sepsis, DIC, Acute respiratory failure with pulmonary edema, Acute Kidney failure with chronic interstitial nephritis   Hypertension    Hyponatremia    Migraines    on Zoloft   for migraines   Mild carotid artery disease    per duplex 08-30-2017 bilateral ICA 1-39%   OA (osteoarthritis) rheumotologist-  dr jon jacob   both knees,  shoulders, ankles   OSA on CPAP     moderate obstructive sleep apnea with an AHI of 23.4/h and oxygen  desaturations as low as 84%.  Now on CPAP at 12 cm H2O.   PAF (paroxysmal atrial fibrillation) [facility]) 2009   cardiologist-- dr vina gull   Paroxysmal atrial flutter [facility])    a. dx 11/2017.   PSVT (paroxysmal supraventricular tachycardia)    RA (rheumatoid arthritis) (HCC)    Wears contact lenses    Assessment:  I am still much better and much less anxious than I was when I got out of the hospital- getting better with each passing day; today I noticed that I had gained 2 lbs from yesterday's weight-- so today was the first day I took the as needed fluid pill-- and the weight came right off, immediately, it worked really quick.  I went to the cardiology NP and the EKG was completely normal- but she has ordered a heart monitor for me to wear at home, just to be safe.  When I get these funny sensations in my chest, like my heart racing, it only lasts a couple of seconds and then goes right away- it very well may be anxiety    Denies clinical  concerns and sounds to be in no distress throughout TOC 30-day program outreach call today  Patient Reported Symptoms: Cognitive Cognitive Status: Normal speech and language skills, Insightful and able to interpret abstract concepts, Alert and oriented to person, place, and time Cognitive/Intellectual Conditions Management [RPT]: None reported or documented in medical history or problem list      Neurological Neurological Review of Symptoms: No symptoms reported    HEENT HEENT Symptoms Reported: No symptoms reported      Cardiovascular Cardiovascular Symptoms Reported: No symptoms reported Other Cardiovascular Symptoms: Continues to report intermittent/ brief self- limiting episodes of funny  sensation near heart; feels kind of like heart racing; confirmed and reviewed recent cardiology office visit 07/09/24: patient currently awaiting home cardiac monitor; Confirmed patient is currently obtaining daily weights at home: appropriately took prn diuretic this morning for weight gain of 2 lbs this morning from yesterday's recorded value Does patient have uncontrolled Hypertension?: No Cardiovascular Management Strategies: Routine screening, Medication therapy, Coping strategies, Adequate rest, Weight management, Diet modification Weight: 233 lb (105.7 kg) (home reported value from 07/14/24- after taking prn diuretic this morning due to weight of 235 lbs)  Respiratory Respiratory Symptoms Reported: No symptoms reported Other Respiratory Symptoms: Denies shortness of breath; cough; sounds to be in no respiratory distress throughout TOC call Respiratory Management Strategies: Routine screening, Medication therapy, Adequate rest, Weight management  Endocrine Endocrine Symptoms Reported: No symptoms reported Is patient diabetic?: No    Gastrointestinal Gastrointestinal Symptoms Reported: No symptoms reported Additional Gastrointestinal Details: Continues to report real good appetite and normal and regular BM's last BM reported as yesterday Gastrointestinal Management Strategies: Coping strategies    Genitourinary Genitourinary Symptoms Reported: No symptoms reported Additional Genitourinary Details: Continues to report nice pale clear yellow urine    Integumentary Integumentary Symptoms Reported: No symptoms reported    Musculoskeletal Musculoskelatal Symptoms Reviewed: No symptoms reported Additional Musculoskeletal Details: confirmed still not currently requiring/ using assistive devices for ambulation; confirmed no new/ recent falls Musculoskeletal Management Strategies: Coping strategies      Psychosocial Psychosocial Symptoms Reported: No symptoms reported Other Psychosocial  Conditions: Continues to report decreased anxiety when she is by herself, husband not present Behavioral Management Strategies: Coping strategies, Support system Major Change/Loss/Stressor/Fears (CP): Medical condition, self Techniques to Cope with Loss/Stress/Change: Diversional activities, Medication Quality of Family Relationships: supportive, involved, helpful   Vitals:   07/14/24 1036  BP: 130/83    Medications Reviewed Today     Reviewed by Letizia Hook M, RN (Registered Nurse) on 07/14/24 at 1031  Med List Status: <None>   Medication Order Taking? Sig Documenting Provider Last Dose Status Informant  acetaminophen  (TYLENOL ) 500 MG tablet [id number]  Take 1,000 mg by mouth 2 (two) times daily as needed for moderate pain (pain score 4-6) or headache. [provider]  Active Self, Pharmacy Records  ALPRAZolam  (XANAX ) 0.25 MG tablet [id number]  Take 1 tablet (0.25 mg total) by mouth 2 (two) times daily as needed for anxiety. Plotnikov, Aleksei V, MD  Active Self, Pharmacy Records  amiodarone  (PACERONE ) 200 MG tablet [id number]  Take 1 tablet (200 mg total) by mouth daily. Aniceto Daphne CROME, NP  Active   cholecalciferol  (VITAMIN D3) 25 MCG (1000 UT) tablet [id number]  Take 1,000 Units by mouth daily. [provider]  Active Self, Pharmacy Records  ELIQUIS  5 MG TABS tablet [id number]  TAKE 1 TABLET(5 MG) BY MOUTH TWICE DAILY Okey Vina [name], MD  Active Self, Pharmacy Records  famotidine  (PEPCID ) 20 MG tablet [id number]  TAKE 1 TABLET(20 MG) BY MOUTH AT BEDTIME Plotnikov, Aleksei V, MD  Active Self, Pharmacy Records  furosemide  (LASIX ) 40 MG tablet [id number]  Take 40 mg by mouth daily as needed for fluid or edema. [provider]  Active Self, Pharmacy Records  hydrALAZINE  (APRESOLINE ) 10 MG tablet [id number]  Take 1 tablet (10 mg total) by mouth every 8 (eight) hours. Caleen Burgess BROCKS, MD  Active   HYDROcodone -acetaminophen  (NORCO/VICODIN) 5-325 MG tablet [id number]  Take 1  tablet by mouth every 6 (six) hours as needed for severe pain (pain score 7-10). Plotnikov, Aleksei V, MD  Active Self, Pharmacy Records  hydroxychloroquine  (PLAQUENIL ) 200 MG tablet [id number]  Take 200 mg by mouth daily. [provider]  Active Self, Pharmacy Records           Med Note (COFFELL, ANGELA M   Wed Jun 24, 2024 10:03 AM) Patient confirms reducing frequency to QD about 3 months ago - has not notified her MD of this but plans to at next appointment.  levalbuterol  (XOPENEX  HFA) 45 MCG/ACT inhaler [id number]  Inhale 2 puffs into the lungs every 6 (six) hours as needed for wheezing. Desai, Nikita S, MD  Active Self, Pharmacy Records           Med Note (CRUTHIS, CHLOE C   Tue Jun 23, 2024  1:23 PM)    loratadine  (CLARITIN ) 10 MG tablet [id number]  Take 10 mg by mouth daily as needed for allergies. [provider]  Active Self, Pharmacy Records  metoprolol  tartrate (LOPRESSOR ) 25 MG tablet [id number]  Take 0.5 tablets (12.5 mg total) by mouth 2 (two) times daily. Mesner, Selinda, MD  Active   potassium chloride  (KLOR-CON ) 10 MEQ tablet [id number]  Take 2 tablets by mouth daily Nahser, Aleene PARAS, MD  Active Self, Pharmacy Records  rosuvastatin  (CRESTOR ) 5 MG tablet [id number]  Take 1 tablet (5 mg total) by mouth daily. Plotnikov, Aleksei V, MD  Active Self, Pharmacy Records  sertraline  (ZOLOFT ) 50 MG tablet [id number]  TAKE 1 TABLET BY MOUTH DAILY Plotnikov, Aleksei V, MD  Active Self, Pharmacy Records           Recommendation:   Continue Current Plan of Care  Follow Up Plan:   Telephone follow-up in 1 week- as scheduled 07/21/24  Pls call/ message for questions,  Beatris Blinda Lawrence, RN, BSN, CCRN Alumnus RN Care Manager  Transitions of Care  [facility] - [facility] [phone]: direct office

## 2024-07-16 DIAGNOSIS — Z6838 Body mass index (BMI) 38.0-38.9, adult: Secondary | ICD-10-CM | POA: Diagnosis not present

## 2024-07-16 DIAGNOSIS — E669 Obesity, unspecified: Secondary | ICD-10-CM | POA: Diagnosis not present

## 2024-07-16 DIAGNOSIS — Z79899 Other long term (current) drug therapy: Secondary | ICD-10-CM | POA: Diagnosis not present

## 2024-07-16 DIAGNOSIS — M0609 Rheumatoid arthritis without rheumatoid factor, multiple sites: Secondary | ICD-10-CM | POA: Diagnosis not present

## 2024-07-16 DIAGNOSIS — M1991 Primary osteoarthritis, unspecified site: Secondary | ICD-10-CM | POA: Diagnosis not present

## 2024-07-20 ENCOUNTER — Telehealth: Payer: Self-pay | Admitting: Pharmacy Technician

## 2024-07-20 NOTE — Telephone Encounter (Signed)
 PAP: Application for multaq has been submitted to [employer], via fax

## 2024-07-21 ENCOUNTER — Other Ambulatory Visit: Payer: Self-pay | Admitting: *Deleted

## 2024-07-21 NOTE — Transitions of Care (Post Inpatient/ED Visit) (Signed)
 Transition of Care week 4/ day # 22  Visit Note  07/21/2024  Name: Amanda Wilkins MRN: [id number]          DOB: 09-Jul-1946  Situation: Patient enrolled in [facility] 30-day program. Visit completed with patient by telephone.   HIPAA identifiers x 2 verified  Background:  Recent hospitalization September 9-12, 2025 for junctional rhythm; CHF exacerbation (1) unplanned hospitalization x last (6) and (12) months Independent at baseline: resides with supportive and helpful spouse  Initial Transition Care Management Follow-up Telephone Call    Past Medical History:  Diagnosis Date   Acute bronchitis 09/11/2016   11/17 refractory   Acute cystitis without hematuria 05/17/2015   Acute kidney injury    Labs today   Acute right ankle pain 09/09/2018   Anticoagulant long-term use    Xarelto    Axillary pain    Bronchitis    Cellulitis    Cholecystitis    Chronic interstitial nephritis    CONJUNCTIVITIS, ACUTE 10/18/2010   Qualifier: Diagnosis of  By: Plotnikov MD, Karlynn [name]    Coronary artery disease    Mild   D-dimer, elevated    Depression    Dysrhythmia    A. Fib/A. Flutter, AVT   Eye inflammation    GERD (gastroesophageal reflux disease)    History of interstitial nephritis 2016   chronic   History of kidney stones    has a small one found on xray   History of nuclear stress test 12/16/2014   Intermediate risk nuclear study w/ medium size moderate severity reversible defect in the basal and mid inferolateral and inferior wall (per dr cardiology note , dr vina gull did not think this was consistent with ischemia)/  normal LV function and wall motion, ef 75%   History of septic shock 01/22/2015   in setting Group A Strep Cellulits erysipelas/chest wall induration with streptoccocus basterium-- Severe sepsis, DIC, Acute respiratory failure with pulmonary edema, Acute Kidney failure with chronic interstitial nephritis   Hypertension    Hyponatremia    Migraines    on Zoloft   for migraines   Mild carotid artery disease    per duplex 08-30-2017 bilateral ICA 1-39%   OA (osteoarthritis) rheumotologist-  dr jon jacob   both knees,  shoulders, ankles   OSA on CPAP     moderate obstructive sleep apnea with an AHI of 23.4/h and oxygen  desaturations as low as 84%.  Now on CPAP at 12 cm H2O.   PAF (paroxysmal atrial fibrillation) [facility]) 2009   cardiologist-- dr vina gull   Paroxysmal atrial flutter [facility])    a. dx 11/2017.   PSVT (paroxysmal supraventricular tachycardia)    RA (rheumatoid arthritis) (HCC)    Wears contact lenses    Assessment:  I am feeling much more confident now- still wearing the heart monitor- I had 2 short episodes where I felt my heart rate increase, it went up into the 140's; I just sat down both times it happened and it went right away within a few minutes; the longest it lasted was about 6 minutes.  I did not do anything to make it stop- it just stopped on it's own, and other than the feeling of it racing- I felt pretty much normal.  Weights are stable- same as they have been- have not needed to take the fluid pill since we talked last.  Blood pressures are okay and in good range- on the lower side.  Heart rates normally high 60-s to low 80's.  Getting out some by myself and not feeling nearly as anxious as I did at first when I got out of the hospital    Denies clinical concerns and sounds to be in no distress throughout [facility] 30-day program outreach call today  Patient Reported Symptoms: Cognitive Cognitive Status: Normal speech and language skills, Alert and oriented to person, place, and time, Insightful and able to interpret abstract concepts Cognitive/Intellectual Conditions Management [RPT]: None reported or documented in medical history or problem list      Neurological Neurological Review of Symptoms: No symptoms reported Neurological Management Strategies: Coping strategies, Medication therapy, Routine screening  HEENT HEENT  Symptoms Reported: No symptoms reported      Cardiovascular Cardiovascular Symptoms Reported: Palpitations Other Cardiovascular Symptoms: reports continues wearing home cardiac monitor: reports had 2 isolated episodes heart racing while wearing monitor: It went up into the 140's reports sat down only lasted a few minutes both times; pressed the button on the herat monitor, and it went away on it's own; Reports heart rates returned then to 70's and 80's and I felt fine  Confirmed continues monitoring daily weights and blood pressures/ resting heart rates at home as per vital signs: confirmed has not needed to take diutetic for weight gain since last TOC call Does patient have uncontrolled Hypertension?: No Cardiovascular Management Strategies: Routine screening, Coping strategies, Adequate rest, Medication therapy, Weight management Do You Have a Working Readable Scale?: Yes Weight: 233 lb (105.7 kg) (home reported weight 07/21/24)  Respiratory Respiratory Symptoms Reported: No symptoms reported Other Respiratory Symptoms: Denies episodes shortness of breath, breathing just fine, even when the heart rates increased those 2 times; sounds to be in no respiratory distress throughout TOC call Respiratory Management Strategies: Routine screening, Medication therapy, Adequate rest, Coping strategies, Weight management  Endocrine Endocrine Symptoms Reported: No symptoms reported Is patient diabetic?: No    Gastrointestinal Gastrointestinal Symptoms Reported: No symptoms reported      Genitourinary Genitourinary Symptoms Reported: No symptoms reported    Integumentary Integumentary Symptoms Reported: No symptoms reported    Musculoskeletal Musculoskelatal Symptoms Reviewed: No symptoms reported Additional Musculoskeletal Details: confirmed not currently requiring/ using assistive devices for ambulation; no new falls since last TOC outreach        Psychosocial Psychosocial Symptoms  Reported: No symptoms reported Other Psychosocial Conditions: Continues to report much less anxiety than when I first got out of the hospital Behavioral Management Strategies: Coping strategies, Support system Major Change/Loss/Stressor/Fears (CP): Medical condition, self Techniques to Cope with Loss/Stress/Change: Diversional activities, Medication Quality of Family Relationships: supportive, involved, helpful   Vitals:   07/21/24 1030  BP: 108/67  Pulse: 68    Medications Reviewed Today     Reviewed by Vernie Piet M, RN (Registered Nurse) on 07/21/24 at 1031  Med List Status: <None>   Medication Order Taking? Sig Documenting Provider Last Dose Status Informant  acetaminophen  (TYLENOL ) 500 MG tablet [id number]  Take 1,000 mg by mouth 2 (two) times daily as needed for moderate pain (pain score 4-6) or headache. [provider]  Active Self, Pharmacy Records  ALPRAZolam  (XANAX ) 0.25 MG tablet [id number]  Take 1 tablet (0.25 mg total) by mouth 2 (two) times daily as needed for anxiety. Plotnikov, Aleksei V, MD  Active Self, Pharmacy Records  amiodarone  (PACERONE ) 200 MG tablet [id number]  Take 1 tablet (200 mg total) by mouth daily. Aniceto Daphne CROME, NP  Active   cholecalciferol  (VITAMIN D3) 25 MCG (1000 UT) tablet [id number]  Take 1,000 Units by mouth  daily. [provider]  Active Self, Pharmacy Records  ELIQUIS  5 MG TABS tablet [id number]  TAKE 1 TABLET(5 MG) BY MOUTH TWICE DAILY Okey Vina [name], MD  Active Self, Pharmacy Records  famotidine  (PEPCID ) 20 MG tablet [id number]  TAKE 1 TABLET(20 MG) BY MOUTH AT BEDTIME Plotnikov, Aleksei V, MD  Active Self, Pharmacy Records  furosemide  (LASIX ) 40 MG tablet [id number]  Take 40 mg by mouth daily as needed for fluid or edema. [provider]  Active Self, Pharmacy Records  hydrALAZINE  (APRESOLINE ) 10 MG tablet [id number]  Take 1 tablet (10 mg total) by mouth every 8 (eight) hours. Caleen Burgess BROCKS, MD  Active    HYDROcodone -acetaminophen  (NORCO/VICODIN) 5-325 MG tablet [id number]  Take 1 tablet by mouth every 6 (six) hours as needed for severe pain (pain score 7-10). Plotnikov, Aleksei V, MD  Active Self, Pharmacy Records  hydroxychloroquine  (PLAQUENIL ) 200 MG tablet [id number]  Take 200 mg by mouth daily. [provider]  Active Self, Pharmacy Records           Med Note (COFFELL, ANGELA M   Wed Jun 24, 2024 10:03 AM) Patient confirms reducing frequency to QD about 3 months ago - has not notified her MD of this but plans to at next appointment.  levalbuterol  (XOPENEX  HFA) 45 MCG/ACT inhaler [id number]  Inhale 2 puffs into the lungs every 6 (six) hours as needed for wheezing. Desai, Nikita S, MD  Active Self, Pharmacy Records           Med Note (CRUTHIS, CHLOE C   Tue Jun 23, 2024  1:23 PM)    loratadine  (CLARITIN ) 10 MG tablet [id number]  Take 10 mg by mouth daily as needed for allergies. [provider]  Active Self, Pharmacy Records  metoprolol  tartrate (LOPRESSOR ) 25 MG tablet [id number]  Take 0.5 tablets (12.5 mg total) by mouth 2 (two) times daily. Mesner, Selinda, MD  Active   potassium chloride  (KLOR-CON ) 10 MEQ tablet [id number]  Take 2 tablets by mouth daily Nahser, Aleene PARAS, MD  Active Self, Pharmacy Records  rosuvastatin  (CRESTOR ) 5 MG tablet [id number]  Take 1 tablet (5 mg total) by mouth daily. Plotnikov, Aleksei V, MD  Active Self, Pharmacy Records  sertraline  (ZOLOFT ) 50 MG tablet [id number]  TAKE 1 TABLET BY MOUTH DAILY Plotnikov, Aleksei V, MD  Active            Recommendation:   Continue Current Plan of Care  Follow Up Plan:   Telephone follow-up in 1 week- as scheduled 07/28/24  Pls call/ message for questions,  Beatris Blinda Lawrence, RN, BSN, CCRN Alumnus RN Care Manager  Transitions of Care  [facility] - [facility] [phone]: direct office

## 2024-07-21 NOTE — Telephone Encounter (Signed)
 PAP: Patient assistance application for multaq  has been approved by PAP Companies: [employer] from 07/21/24 to 10/14/24. Medication should be delivered to PAP Delivery: Provider's office. For further shipping updates, please contact [employer] at [phone]. Patient ID is: [id number]

## 2024-07-21 NOTE — Patient Instructions (Signed)
 Visit Information  Thank you for taking time to visit with me today. Please don't hesitate to contact me if I can be of assistance to you before our next scheduled telephone appointment.  Our next appointment is by telephone on Tuesday, July 28, 2024 at 11:00 am  Please call the care guide team at [phone] if you need to cancel or reschedule your appointment.   Following are the goals we discussed today:  Patient Self Care Activities:  Attend all scheduled provider appointments Call provider office for new concerns or questions  Participate in Transition of Care Program/Attend TOC scheduled calls Take medications as prescribed   Continue monitoring and recording your blood pressures and daily weights at home, as well as your blood pressures and heart rates: take all recorded values with you to your scheduled doctor appointments Follow the weight-gain guidelines and action plan to call your doctor if you gain more than 3 lbs overnight, or 5 lbs in one week Use assistive devices as needed to prevent falls- your cane and/ or walker Continue to eat a heart healthy and low salt diet If you believe your condition is getting worse- contact your care providers (doctors) promptly- reaching out to your doctor early when you have concerns can prevent you from having to go to the hospital  If you are experiencing a Mental Health or Behavioral Health Crisis or need someone to talk to, please  call the Suicide and Crisis Lifeline: 988 call the [place]  National Suicide Prevention Lifeline: 7726649382 or TTY: 438-682-1351 TTY 561-575-8822) to talk to a trained counselor call 1-800-273-TALK (toll free, 24 hour hotline) go to [facility] [address] [place] [phone]) call the [facility] Crisis Line: [phone] call 911   Patient verbalizes understanding of instructions and care plan provided today and agrees to view in MyChart. Active  MyChart status and patient understanding of how to access instructions and care plan via MyChart confirmed with patient.     Francile Woolford Mckinney Amedio Bowlby, RN, BSN, Media planner  Transitions of Care  [facility] - [facility] [phone]: direct office

## 2024-07-22 NOTE — Telephone Encounter (Signed)
 Amanda Wilkins,   After much thought, can you please have Amanda Wilkins stop her amiodarone . She has had multiple concerns on the medication.  It will take several weeks for it to clear out of her system.  She may have break through atrial fibrillation. However, we will stop this in anticipation of potentially starting a different anti-arrhythmic medication for her. We are pending a cardiac monitor. In the mean time, I am working to get her in with Dr. Inocencio soon and can discuss options further.  She is not committed to ablation if she does not want to pursue it.  However, the data for ablation is superior at controlling AFib over medications.  Something we can chat about further in clinic.   Best Regards,  Daphne Barrack, NP-C, AGACNP-BC [facility] - Electrophysiology  07/22/2024, 1:36 PM

## 2024-07-22 NOTE — Telephone Encounter (Signed)
 Called patient with Amanda Barrack NP's advisement. Patient will stop amiodarone  and see how she does off of the medication. Patient will give our office a call or Mychart us  if she has any issues. Patient will keep her appointment with Brandi, but would like to get in sooner if available with Dr. Inocencio. Patient will continue to wear the monitor.

## 2024-07-22 NOTE — Telephone Encounter (Signed)
 See prior note.   Daphne Barrack, NP-C, AGACNP-BC [facility] - Elect[facility]iology  07/22/2024, 7:30 PM

## 2024-07-22 NOTE — Telephone Encounter (Signed)
 No, hold off for now. Thank you!  Daphne

## 2024-07-23 ENCOUNTER — Telehealth: Payer: Self-pay | Admitting: Cardiology

## 2024-07-23 NOTE — Telephone Encounter (Signed)
 Pt c/o medication issue:  1. Name of Medication: amiodorone   2. How are you currently taking this medication (dosage and times per day)? Not taking per Provider  3. Are you having a reaction (difficulty breathing--STAT)? No   4. What is your medication issue? Pt went off medication and her HR is jumping up and down. Please advise.

## 2024-07-23 NOTE — Telephone Encounter (Signed)
 Pt also sent a mychart message. Please see mychart message for more information.

## 2024-07-24 ENCOUNTER — Other Ambulatory Visit: Payer: Self-pay

## 2024-07-24 ENCOUNTER — Other Ambulatory Visit (HOSPITAL_COMMUNITY): Payer: Self-pay

## 2024-07-24 MED ORDER — HYDRALAZINE HCL 10 MG PO TABS: 10.0000 mg | ORAL_TABLET | Freq: Three times a day (TID) | ORAL | 3 refills | Status: DC

## 2024-07-24 NOTE — Addendum Note (Signed)
 Addended by: VICCI ROXIE CROME on: 07/24/2024 05:37 PM   Modules accepted: Orders

## 2024-07-27 ENCOUNTER — Ambulatory Visit: Admitting: Physician Assistant

## 2024-07-28 ENCOUNTER — Other Ambulatory Visit: Payer: Self-pay | Admitting: *Deleted

## 2024-07-28 VITALS — Wt 233.0 lb

## 2024-07-28 DIAGNOSIS — I471 Supraventricular tachycardia, unspecified: Secondary | ICD-10-CM

## 2024-07-28 DIAGNOSIS — R002 Palpitations: Secondary | ICD-10-CM

## 2024-07-28 DIAGNOSIS — I5032 Chronic diastolic (congestive) heart failure: Secondary | ICD-10-CM

## 2024-07-28 MED ORDER — FUROSEMIDE 40 MG PO TABS: 40.0000 mg | ORAL_TABLET | Freq: Every day | ORAL | 3 refills | Status: AC | PRN

## 2024-07-28 NOTE — Patient Instructions (Signed)
 Visit Information  Thank you for taking time to visit with me today. Please don't hesitate to contact me if I can be of assistance to you before our next scheduled telephone appointment.  It has been a pleasure working with you over the last 30 days!  Great job managing your care after your hospital visit!  I am glad that you are doing well!  Please listen for a call from the scheduling care guide to schedule a phone call with the new nurse care manager who will pick up in your care where we are leaving off today  Following are the goals we discussed today:   Patient Self Care Activities:  Attend all scheduled provider appointments Call provider office for new concerns or questions  Take medications as prescribed   Continue monitoring and recording your blood pressures and daily weights at home, as well as your blood pressures and heart rates: take all recorded values with you to your scheduled doctor appointments Follow the weight-gain guidelines and action plan to call your doctor if you gain more than 3 lbs overnight, or 5 lbs in one week Continue to eat a heart healthy and low salt diet If you believe your condition is getting worse- contact your care providers (doctors) promptly- reaching out to your doctor early when you have concerns can prevent you from having to go to the hospital  If you are experiencing a Mental Health or Behavioral Health Crisis or need someone to talk to, please  call the Suicide and Crisis Lifeline: 988 call the [place]  National Suicide Prevention Lifeline: 902-119-2495 or TTY: 863 142 7303 TTY (431) 210-9248) to talk to a trained counselor call 1-800-273-TALK (toll free, 24 hour hotline) go to [facility] [address] [place] [phone]) call the [facility] Crisis Line: [phone] call 911   Patient verbalizes understanding of instructions and care plan provided today and agrees to view in MyChart. Active  MyChart status and patient understanding of how to access instructions and care plan via MyChart confirmed with patient.     Pls call/ message for questions,  Alana Dayton Mckinney Jaxan Michel, RN, BSN, CCRN Alumnus RN Care Manager  Transitions of Care  [facility] - [facility] [phone]: direct office

## 2024-07-28 NOTE — Transitions of Care (Post Inpatient/ED Visit) (Signed)
 Transition of Care week # 5/ day # 29 TOC 30-day program case closure  Visit Note  07/28/2024  Name: Amanda Wilkins MRN: [id number]          DOB: [birth year]  Situation: Patient enrolled in [facility] 30-day program. Visit completed with patient by telephone.   HIPAA identifiers x 2 verified  TOC 30--day outreach completed; patient has successfully met/ accomplished her established goals for TOC 30-day program without hospital readmission and referral was sent for ongoing follow up with longitudinal RN CM   Background:  Recent hospitalization September 9-12, 2025 for junctional rhythm; CHF exacerbation (1) unplanned hospitalization x last (6) and (12) months Independent at baseline: resides with supportive and helpful spouse  Initial Transition Care Management Follow-up Telephone Call Discharge Date and Diagnosis: 06/26/24, Junctional rhythm; bradycardia; hyponatremia; in setting of CHF   Past Medical History:  Diagnosis Date   Acute bronchitis 09/11/2016   11/17 refractory   Acute cystitis without hematuria 05/17/2015   Acute kidney injury    Labs today   Acute right ankle pain 09/09/2018   Anticoagulant long-term use    Xarelto    Axillary pain    Bronchitis    Cellulitis    Cholecystitis    Chronic interstitial nephritis    CONJUNCTIVITIS, ACUTE 10/18/2010   Qualifier: Diagnosis of  By: Plotnikov MD, Karlynn [name]    Coronary artery disease    Mild   D-dimer, elevated    Depression    Dysrhythmia    A. Fib/A. Flutter, AVT   Eye inflammation    GERD (gastroesophageal reflux disease)    History of interstitial nephritis 2016   chronic   History of kidney stones    has a small one found on xray   History of nuclear stress test 12/16/2014   Intermediate risk nuclear study w/ medium size moderate severity reversible defect in the basal and mid inferolateral and inferior wall (per dr cardiology note , dr vina gull did not think this was consistent with ischemia)/  normal  LV function and wall motion, ef 75%   History of septic shock 01/22/2015   in setting Group A Strep Cellulits erysipelas/chest wall induration with streptoccocus basterium-- Severe sepsis, DIC, Acute respiratory failure with pulmonary edema, Acute Kidney failure with chronic interstitial nephritis   Hypertension    Hyponatremia    Migraines    on Zoloft  for migraines   Mild carotid artery disease    per duplex 08-30-2017 bilateral ICA 1-39%   OA (osteoarthritis) rheumotologist-  dr jon jacob   both knees,  shoulders, ankles   OSA on CPAP     moderate obstructive sleep apnea with an AHI of 23.4/h and oxygen  desaturations as low as 84%.  Now on CPAP at 12 cm H2O.   PAF (paroxysmal atrial fibrillation) [facility]) 2009   cardiologist-- dr vina gull   Paroxysmal atrial flutter [facility])    a. dx 11/2017.   PSVT (paroxysmal supraventricular tachycardia)    RA (rheumatoid arthritis) (HCC)    Wears contact lenses    Assessment:  I am still feeling much more confident and feeling good overall; going out to plant flowers this morning.  Still wearing heart monitor- haven't had any recent terrible episodes with heart racing- if I do, I just take an extra half metoprolol  like the cardiology doctor told me to.  Haven't needed to do that recently- but I did take the diuretic this morning because I noticed some slight swelling in my ankles and also had 2  and a half lb weight gain.  After about 2 hours of taking the diuretic, my swelling was gone and my weight was back where it should be.  Blood pressures are all very good.    Denies clinical concerns and sounds to be in no distress throughout [facility] 30-day program outreach call today  Patient Reported Symptoms: Cognitive Cognitive Status: Normal speech and language skills, Alert and oriented to person, place, and time, Insightful and able to interpret abstract concepts Cognitive/Intellectual Conditions Management [RPT]: None reported or documented in medical  history or problem list      Neurological Neurological Review of Symptoms: No symptoms reported    HEENT HEENT Symptoms Reported: No symptoms reported      Cardiovascular Cardiovascular Symptoms Reported: Swelling in legs or feet Other Cardiovascular Symptoms: Confirms no regular palpitations recently; confirms continues to wear home cardiac monitor x one more day; confirms continues monitoring/ recording daily weights at home; reports noticed some very slight swelling in my ankles very early this morning and had about 2 pound weight gain; confirmed took extra diuretic as instructed: the weight came right down to where it should be- 233 lbs from initially 236 lbs Reports continues to take an extra metoprolol  IF I feel like my heart rate is too high or fluttering; as instructed verbally by cardiology provider Does patient have uncontrolled Hypertension?: No Cardiovascular Management Strategies: Routine screening, Adequate rest, Coping strategies, Diet modification, Fluid modification, Weight management Do You Have a Working Readable Scale?: Yes Weight: 233 lb (105.7 kg) (home reported weight from 07/28/24- after taking diuretic for weight of 236 lbs early in am)  Respiratory Respiratory Symptoms Reported: No symptoms reported Other Respiratory Symptoms: Denies shortness of breath/ sounds to be in no distress throughout [facility] call    Endocrine Endocrine Symptoms Reported: No symptoms reported Is patient diabetic?: No    Gastrointestinal Gastrointestinal Symptoms Reported: No symptoms reported      Genitourinary Genitourinary Symptoms Reported: No symptoms reported    Integumentary Integumentary Symptoms Reported: No symptoms reported    Musculoskeletal Musculoskelatal Symptoms Reviewed: No symptoms reported Additional Musculoskeletal Details: confirmed not currently requiring/ using assistive devices for ambulation; no new/ recent falls Musculoskeletal Management Strategies:  Coping strategies      Psychosocial Psychosocial Symptoms Reported: No symptoms reported Behavioral Management Strategies: Coping strategies, Support system, Adequate rest Major Change/Loss/Stressor/Fears (CP): Medical condition, self Techniques to Cope with Loss/Stress/Change: Diversional activities Quality of Family Relationships: helpful, involved, supportive   There were no vitals filed for this visit.  Medications Reviewed Today     Reviewed by Breasia Karges M, RN (Registered Nurse) on 07/28/24 at 1103  Med List Status: <None>   Medication Order Taking? Sig Documenting Provider Last Dose Status Informant  acetaminophen  (TYLENOL ) 500 MG tablet [id number]  Take 1,000 mg by mouth 2 (two) times daily as needed for moderate pain (pain score 4-6) or headache. [provider]  Active Self, Pharmacy Records  ALPRAZolam  (XANAX ) 0.25 MG tablet [id number]  Take 1 tablet (0.25 mg total) by mouth 2 (two) times daily as needed for anxiety. Plotnikov, Aleksei V, MD  Active Self, Pharmacy Records  cholecalciferol  (VITAMIN D3) 25 MCG (1000 UT) tablet [id number]  Take 1,000 Units by mouth daily. [provider]  Active Self, Pharmacy Records  ELIQUIS  5 MG TABS tablet [id number]  TAKE 1 TABLET(5 MG) BY MOUTH TWICE DAILY Okey Vina [name], MD  Active Self, Pharmacy Records  famotidine  (PEPCID ) 20 MG tablet [id number]  TAKE 1 TABLET(20 MG) BY MOUTH  AT BEDTIME Plotnikov, Aleksei V, MD  Active Self, Pharmacy Records  furosemide  (LASIX ) 40 MG tablet [id number]  Take 1 tablet (40 mg total) by mouth daily as needed for fluid or edema. Okey Vina [name], MD  Active   hydrALAZINE  (APRESOLINE ) 10 MG tablet [id number]  Take 1 tablet (10 mg total) by mouth every 8 (eight) hours. Okey Vina [name], MD  Active   HYDROcodone -acetaminophen  (NORCO/VICODIN) 5-325 MG tablet [id number]  Take 1 tablet by mouth every 6 (six) hours as needed for severe pain (pain score 7-10). Plotnikov, Aleksei V, MD  Active Self, Pharmacy Records   hydroxychloroquine  (PLAQUENIL ) 200 MG tablet [id number]  Take 200 mg by mouth daily. [provider]  Active Self, Pharmacy Records           Med Note (COFFELL, ANGELA M   Wed Jun 24, 2024 10:03 AM) Patient confirms reducing frequency to QD about 3 months ago - has not notified her MD of this but plans to at next appointment.  levalbuterol  (XOPENEX  HFA) 45 MCG/ACT inhaler [id number]  Inhale 2 puffs into the lungs every 6 (six) hours as needed for wheezing. Desai, Nikita S, MD  Active Self, Pharmacy Records           Med Note (CRUTHIS, CHLOE C   Tue Jun 23, 2024  1:23 PM)    loratadine  (CLARITIN ) 10 MG tablet [id number]  Take 10 mg by mouth daily as needed for allergies. [provider]  Active Self, Pharmacy Records  metoprolol  tartrate (LOPRESSOR ) 25 MG tablet [id number]  Take 0.5 tablets (12.5 mg total) by mouth 2 (two) times daily. Mesner, Selinda, MD  Active   potassium chloride  (KLOR-CON ) 10 MEQ tablet [id number]  Take 2 tablets by mouth daily Nahser, Aleene PARAS, MD  Active Self, Pharmacy Records  rosuvastatin  (CRESTOR ) 5 MG tablet [id number]  Take 1 tablet (5 mg total) by mouth daily. Plotnikov, Karlynn [name], MD  Active Self, Pharmacy Records  sertraline  (ZOLOFT ) 50 MG tablet [id number]  TAKE 1 TABLET BY MOUTH DAILY Plotnikov, Aleksei V, MD  Active            Recommendation:   Specialty provider follow-up- as scheduled: cardiology provider 08/20/24 Continue Current Plan of Care  Follow Up Plan:   Referral to RN Case Manager Closing From:  Transitions of Care Program  Pls call/ message for questions,  Olamide Carattini Mckinney Ronnita Paz, RN, BSN, CCRN Alumnus RN Care Manager  Transitions of Care  [facility] - [facility] [phone]: direct office

## 2024-07-29 ENCOUNTER — Telehealth: Payer: Self-pay | Admitting: *Deleted

## 2024-07-29 DIAGNOSIS — Z79899 Other long term (current) drug therapy: Secondary | ICD-10-CM | POA: Diagnosis not present

## 2024-07-29 NOTE — Telephone Encounter (Signed)
 Pharmacy please advise on holding Eliquis  prior to LEFT TOTAL KNEE ARTHROPLASTY  scheduled for 09/07/2024. Last labs 07/07/2024. Thank you.

## 2024-07-29 NOTE — Telephone Encounter (Signed)
   Pre-operative Risk Assessment    Patient Name: Amanda Wilkins  DOB: October 12, 1946 MRN: [id number]   Date of last office visit: 07/09/24 Date of next office visit: 08/20/24   Request for Surgical Clearance    Procedure:  LEFT TOTAL KNEE ARTHROPLASTY  Date of Surgery:  09/07/24                                Surgeon:  DR. DEMPSEY MOAN Surgeon's Group or Practice Name:  [facility] Phone number:  [phone] KERRI MAZE Fax number:  [phone]   Type of Clearance Requested:   - Medical  - Pharmacy:  Hold Apixaban  (Eliquis ) and NOT INDICATED      Type of Anesthesia:  CHOICE   Additional requests/questions:    Bonney Apolinar [place]   07/29/2024, 3:28 PM

## 2024-07-29 NOTE — Telephone Encounter (Signed)
   Name: Amanda Wilkins  DOB: January 22, 1946  MRN: [id number]  Primary Cardiologist: Vina Gull, MD  Chart reviewed as part of pre-operative protocol coverage. The patient has an upcoming visit scheduled with Daphne Barrack on 08/20/2024 at which time clearance can be addressed in case there are any issues that would impact surgical recommendations.  LEFT TOTAL KNEE ARTHROPLASTY Is not scheduled until 09/07/2024 as below. I added preop FYI to appointment note so that provider is aware to address at time of outpatient visit.  Per office protocol the cardiology provider should forward their finalized clearance decision and recommendations regarding antiplatelet therapy to the requesting party below.    I have requested pharmacy recommendations on Eliquis  hold.  I will route this message as FYI to requesting party and remove this message from the preop box as separate preop APP input not needed at this time.   Please call with any questions.  [name], NP  07/29/2024, 4:10 PM

## 2024-07-30 ENCOUNTER — Ambulatory Visit: Payer: Self-pay | Admitting: Pulmonary Disease

## 2024-07-30 ENCOUNTER — Telehealth: Payer: Self-pay | Admitting: *Deleted

## 2024-07-30 LAB — T4, FREE: Free T4: 0.9 ng/dL (ref 0.82–1.77)

## 2024-07-30 LAB — COMPREHENSIVE METABOLIC PANEL WITH GFR
ALT: 23 IU/L (ref 0–32)
AST: 25 IU/L (ref 0–40)
Albumin: 4.7 g/dL (ref 3.8–4.8)
Alkaline Phosphatase: 82 IU/L (ref 49–135)
BUN/Creatinine Ratio: 19 (ref 12–28)
BUN: 16 mg/dL (ref 8–27)
Bilirubin Total: 0.7 mg/dL (ref 0.0–1.2)
CO2: 19 mmol/L — ABNORMAL LOW (ref 20–29)
Calcium: 9.4 mg/dL (ref 8.7–10.3)
Chloride: 91 mmol/L — ABNORMAL LOW (ref 96–106)
Creatinine, Ser: 0.84 mg/dL (ref 0.57–1.00)
Globulin, Total: 2.5 g/dL (ref 1.5–4.5)
Glucose: 83 mg/dL (ref 70–99)
Potassium: 4.9 mmol/L (ref 3.5–5.2)
Sodium: 129 mmol/L — ABNORMAL LOW (ref 134–144)
Total Protein: 7.2 g/dL (ref 6.0–8.5)
eGFR: 72 mL/min/1.73 (ref >59)

## 2024-07-30 LAB — TSH: TSH: 3.75 u[IU]/mL (ref 0.450–4.500)

## 2024-07-30 NOTE — Progress Notes (Signed)
 Complex Care Management Note Care Guide Note  07/30/2024 Name: Amanda Wilkins MRN: [id number] DOB: Jan 03, 1946   Complex Care Management Outreach Attempts: An unsuccessful telephone outreach was attempted today to offer the patient information about available complex care management services.  Follow Up Plan:  Additional outreach attempts will be made to offer the patient complex care management information and services.   Encounter Outcome:  No Answer  Thedford Franks, CMA [place]  [facility], [facility] Guide Direct Dial: [phone]  Fax: [phone] Website: [email or web address]

## 2024-07-31 NOTE — Progress Notes (Unsigned)
 Complex Care Management Note Care Guide Note  07/31/2024 Name: Amanda Wilkins MRN: [id number] DOB: 19-Jan-1946   Complex Care Management Outreach Attempts: A second unsuccessful outreach was attempted today to offer the patient with information about available complex care management services.  Follow Up Plan:  Additional outreach attempts will be made to offer the patient complex care management information and services.   Encounter Outcome:  No Answer  ..cfs

## 2024-08-03 ENCOUNTER — Telehealth: Payer: Self-pay | Admitting: Internal Medicine

## 2024-08-03 NOTE — Telephone Encounter (Signed)
 Pt requesting c/b from [facility] regarding CPAP machine. Please advise

## 2024-08-03 NOTE — Progress Notes (Signed)
 Complex Care Management Note Care Guide Note  08/03/2024 Name: Amanda Wilkins MRN: [id number] DOB: 13-Sep-1946   Complex Care Management Outreach Attempts: A third unsuccessful outreach was attempted today to offer the patient with information about available complex care management services.  Follow Up Plan:  No further outreach attempts will be made at this time. We have been unable to contact the patient to offer or enroll patient in complex care management services.  Encounter Outcome:  No Answer  Thedford Franks, CMA [place]  [facility], [facility] Guide Direct Dial: [phone]  Fax: [phone] Website: [email or web address]

## 2024-08-03 NOTE — Telephone Encounter (Signed)
 Patient with diagnosis of PAF on Eliquis  for anticoagulation.    Procedure: LEFT TOTAL KNEE ARTHROPLASTY  Date of procedure: 09/07/24   CHA2DS2-VASc Score = 6  This indicates a 9.7% annual risk of stroke. The patient's score is based upon: CHF History: 1 HTN History: 1 Diabetes History: 0 Stroke History: 0 Vascular Disease History: 1 Age Score: 2 Gender Score: 1    CrCl 94 ml/min Platelet count 177K  Patient has not had an Afib/aflutter ablation in the last 3 months, DCCV within the last 4 weeks or a watchman implanted in the last 45 days    Per office protocol, patient can hold Eliquis  for 3 days prior to procedure.      **This guidance is not considered finalized until pre-operative APP has relayed final recommendations.**

## 2024-08-06 DIAGNOSIS — I480 Paroxysmal atrial fibrillation: Secondary | ICD-10-CM

## 2024-08-07 ENCOUNTER — Ambulatory Visit (INDEPENDENT_AMBULATORY_CARE_PROVIDER_SITE_OTHER)

## 2024-08-07 DIAGNOSIS — Z23 Encounter for immunization: Secondary | ICD-10-CM

## 2024-08-07 NOTE — Progress Notes (Signed)
 Pt received there flu vaccine today and there were no questions or concerns.

## 2024-08-10 ENCOUNTER — Encounter: Payer: Self-pay | Admitting: Cardiology

## 2024-08-10 ENCOUNTER — Encounter: Payer: Self-pay | Admitting: Internal Medicine

## 2024-08-10 ENCOUNTER — Ambulatory Visit: Attending: Cardiology | Admitting: Cardiology

## 2024-08-10 ENCOUNTER — Ambulatory Visit: Payer: Self-pay | Admitting: Pulmonary Disease

## 2024-08-10 VITALS — BP 120/82 | HR 75 | Ht 65.0 in | Wt 235.0 lb

## 2024-08-10 DIAGNOSIS — I10 Essential (primary) hypertension: Secondary | ICD-10-CM | POA: Diagnosis not present

## 2024-08-10 DIAGNOSIS — R002 Palpitations: Secondary | ICD-10-CM | POA: Insufficient documentation

## 2024-08-10 DIAGNOSIS — I4819 Other persistent atrial fibrillation: Secondary | ICD-10-CM | POA: Diagnosis not present

## 2024-08-10 DIAGNOSIS — I493 Ventricular premature depolarization: Secondary | ICD-10-CM | POA: Insufficient documentation

## 2024-08-10 DIAGNOSIS — D6869 Other thrombophilia: Secondary | ICD-10-CM | POA: Diagnosis not present

## 2024-08-10 DIAGNOSIS — I4892 Unspecified atrial flutter: Secondary | ICD-10-CM | POA: Diagnosis not present

## 2024-08-10 MED ORDER — DRONEDARONE HCL 400 MG PO TABS: 400.0000 mg | ORAL_TABLET | Freq: Two times a day (BID) | ORAL | Status: DC

## 2024-08-10 NOTE — Patient Instructions (Signed)
 Medication Instructions:  Your physician has recommended you make the following change in your medication:  START Multaq  400 mg twice a day  *If you need a refill on your cardiac medications before your next appointment, please call your pharmacy*  Lab Work: None ordered   Testing/Procedures: None ordered  Follow-Up: At [facility], you and your health needs are our priority.  As part of our continuing mission to provide you with exceptional heart care, our providers are all part of one team.  This team includes your primary Cardiologist (physician) and Advanced Practice Providers or APPs (Physician Assistants and Nurse Practitioners) who all work together to provide you with the care you need, when you need it.  Your next appointment:   3 month(s)  Provider:   You will see one of the following Advanced Practice Providers on your designated Care Team:   Charlies Arthur, PA-C Michael Andy Tillery, PA-C Brandi Ollis, NP Artist Pouch, PA-C    Thank you for choosing [facility]!!   Maeola Domino, RN [phone]

## 2024-08-10 NOTE — Progress Notes (Signed)
 Electrophysiology Office Note:   Date:  08/10/2024  ID:  Amanda Wilkins, DOB 16-Sep-1946, MRN [id number]  Primary Cardiologist: Vina Gull, MD Primary Heart Failure: None Electrophysiologist: Aleni Andrus Gladis Norton, MD      History of Present Illness:   Amanda Wilkins is a 78 y.o. female with h/o atrial fibrillation/flutter, hypertension, hyperlipidemia, mild MR, interstitial nephritis, sleep apnea seen today for routine electrophysiology followup.   Discussed the use of AI scribe software for clinical note transcription with the patient, who gave verbal consent to proceed.  History of Present Illness Amanda Wilkins is a 78 year old female with arrhythmias who presents with palpitations and elevated heart rate.  She experiences frequent premature ventricular contractions (PVCs) and episodes of elevated heart rate, reaching up to 140 beats per minute while at rest, which then returns to the 70s. These symptoms have been persistent and bothersome.  She previously tried amiodarone  but discontinued it after a nurse advised her to stop, despite not experiencing any side effects. She is concerned about potential side effects of amiodarone , which influenced her decision to stop the medication.  She mentions a recent eye issue, described as a 'weird, thick, and heavy feeling' in her eye, which appeared a few days after starting amiodarone . She reports that her eye doctor said it was likely due to a gel-like substance coming loose and that it would settle down, and did not think it was related to amiodarone  use. She continues to experience floaters, particularly on the right side.  She is currently on metoprolol , taking half a tablet in the morning and evening, with an additional half in the afternoon if needed. She recalls previously being on a higher dose before a hospital visit where it was reduced. She wants an increased dosage to manage her symptoms better.  She is concerned about  fluid retention and heart failure, noting that she avoids salty foods and occasionally takes a diuretic. She reports that her recent heart ultrasound was normal and did not show excess fluid or pressure, according to her understanding.  She has been losing weight with the help of a weight loss medication, Zepbound , having lost 14 pounds. She reports feeling different and acknowledges the difficulty she has faced in losing weight over the years. She recently resumed the medication after a brief pause.  She inquires about her capability to undergo knee replacement surgery scheduled for November 25th, expressing concern about her heart health in relation to the procedure.  she denies chest pain, palpitations, dyspnea, PND, orthopnea, nausea, vomiting, dizziness, syncope, edema, weight gain, or early satiety.   Review of systems complete and found to be negative unless listed in HPI.   EP Information / Studies Reviewed:    EKG is ordered today. Personal review as below.  EKG Interpretation Date/Time:  Monday August 10 2024 16:35:05 EDT Ventricular Rate:  75 PR Interval:  198 QRS Duration:  90 QT Interval:  390 QTC Calculation: 435 R Axis:   53  Text Interpretation: Sinus rhythm with Premature supraventricular complexes and with occasional Premature ventricular complexes When compared with ECG of 09-Jul-2024 15:59, Premature ventricular complexes are now Present Premature supraventricular complexes are now Present Confirmed by Reeve Mallo ([id number]) on 08/10/2024 4:42:22 PM   Risk Assessment/Calculations:    CHA2DS2-VASc Score = 6   This indicates a 9.7% annual risk of stroke. The patient's score is based upon: CHF History: 1 HTN History: 1 Diabetes History: 0 Stroke History: 0 Vascular Disease History: 1 Age Score: 2 Gender Score: 1  Physical Exam:   VS:  BP 120/82 (BP Location: Left Arm, Patient Position: Sitting, Cuff Size: Large)   Pulse 75   Ht 5' 5 (1.651 m)    Wt 235 lb (106.6 kg)   SpO2 97%   BMI 39.11 kg/m    Wt Readings from Last 3 Encounters:  08/10/24 235 lb (106.6 kg)  07/28/24 233 lb (105.7 kg)  07/21/24 233 lb (105.7 kg)     GEN: Well nourished, well developed in no acute distress NECK: No JVD; No carotid bruits CARDIAC: Regular rate and rhythm, no murmurs, rubs, gallops RESPIRATORY:  Clear to auscultation without rales, wheezing or rhonchi  ABDOMEN: Soft, non-tender, non-distended EXTREMITIES:  No edema; No deformity   ASSESSMENT AND PLAN:    1.  Paroxysmal atrial fibrillation/flutter/atrial tachycardia: She is on metoprolol .  She has unfortunately had issues with bradycardia on higher doses.  She is tolerating the medication well at the current dose.  She does continue to have palpitations.  She is interested in starting Multaq .  Zaden Sako start 400 mg twice daily today.  2.  Secondary hypercoagulable state: On Eliquis   3.  Hypertension: Well-controlled  4.  PVCs: Elevated burden on EKG today.  Prior EKGs without PVCs.  Starting Multaq  as above.  Follow up with EP Team in 3 months  Signed, Marua Qin Gladis Norton, MD

## 2024-08-12 MED ORDER — ZEPBOUND 2.5 MG/0.5ML ~~LOC~~ SOAJ: 2.5000 mg | SUBCUTANEOUS | 3 refills | Status: DC

## 2024-08-17 ENCOUNTER — Encounter: Payer: Self-pay | Admitting: Radiology

## 2024-08-17 ENCOUNTER — Telehealth: Payer: Self-pay | Admitting: Pharmacy Technician

## 2024-08-17 NOTE — Telephone Encounter (Signed)
 PAP: Patient assistance application for Multaq  through [employer] has been mailed to pt's home address on file. Provider portion of application will be faxed to provider's office.  Uploaded the providers blank portion to media

## 2024-08-18 ENCOUNTER — Telehealth: Payer: Self-pay | Admitting: Cardiology

## 2024-08-18 NOTE — Telephone Encounter (Signed)
 Pt said that Dr Carolyn PA Rosaline just called her. Please advise

## 2024-08-18 NOTE — Telephone Encounter (Signed)
 Patient was seen on 08/10/24 by Dr. Inocencio. He mentions upcoming knee surgery but does not specificallysay that she is cleared to proceed.  She was started on Multaq  for management of PAF. Since that time, she has contacted our office about concerning symptoms.  Attempted to call her to follow-up on cardiac symptoms and perform preop evaluation.  Left message for patient to call back.  Rosaline EMERSON Bane, NP-C  08/18/2024, 4:33 PM [address], [address] [place], [place] [address] Office [phone] Fax [phone]

## 2024-08-18 NOTE — Telephone Encounter (Signed)
 Requesting inquiring if pt has been cleared. I have reviewed the chart and see the pt had appt with Dr. Inocencio 08/10/24. Will ask preop APP to review if the pt has been cleared.
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
## 2024-08-19 NOTE — Telephone Encounter (Signed)
   Name: Amanda Wilkins  DOB: Jan 28, 1946  MRN: [id number]   Primary Cardiologist: Vina Gull, MD  Chart reviewed as part of pre-operative protocol coverage. Patient was contacted 08/19/2024 in reference to pre-operative risk assessment for pending surgery as outlined below.  Amanda Wilkins was last seen on 08/10/24 by Dr. Inocencio.  Since that day, Amanda Wilkins has done well overall. She is still having some breakthrough palpitations.    No chest pain or SOB, no swelling in her feet or hands. She does have a persistent cough, phlegm builds up and its persistent, happening for some times, went to an ENT.   She is now scheduled for left total knee arthoplasty on 11/24. Doing well overall on the Multaq . She was asked to reach out if the palpitations become more frequent or are lasting longer or if she has any changes in her symptoms.   Her BP was low one time but has been better.  137/73 today HR 81  Per office protocol, patient can hold Eliquis  for 3 days prior to procedure. Restart when its safe to do.   Therefore, based on ACC/AHA guidelines, the patient would be at acceptable risk for the planned procedure without further cardiovascular testing.   The patient was advised that if she develops new symptoms prior to surgery to contact our office to arrange for a follow-up visit, and she verbalized understanding.  I will route this recommendation to the requesting party via Epic fax function and remove from pre-op pool. Please call with questions.  Orren LOISE Fabry, PA-C 08/19/2024, 4:53 PM

## 2024-08-20 ENCOUNTER — Ambulatory Visit: Admitting: Pulmonary Disease

## 2024-08-21 ENCOUNTER — Telehealth: Payer: Self-pay | Admitting: Internal Medicine

## 2024-08-21 NOTE — Telephone Encounter (Signed)
 Called patinet    BP running a little flow   90s to 100s     Recomm:   Hold hydralazine   Follow BP  Keep on metoprolol  and Multaq      -

## 2024-08-25 NOTE — Patient Instructions (Signed)
 SURGICAL WAITING ROOM VISITATION Patients having surgery or a procedure may have no more than 2 support people in the waiting area - these visitors may rotate in the visitor waiting room.   If the patient needs to stay at the hospital during part of their recovery, the visitor guidelines for inpatient rooms apply.  PRE-OP VISITATION  Pre-op nurse will coordinate an appropriate time for 1 support person to accompany the patient in pre-op.  This support person may not rotate.  This visitor will be contacted when the time is appropriate for the visitor to come back in the pre-op area.  Please refer to the [facility] website for the visitor guidelines for Inpatients (after your surgery is over and you are in a regular room).  You are not required to quarantine at this time prior to your surgery. However, you must do this: Hand Hygiene often Do NOT share personal items Notify your provider if you are in close contact with someone who has COVID or you develop fever 100.4 or greater, new onset of sneezing, cough, sore throat, shortness of breath or body aches.  If you test positive for Covid or have been in contact with anyone that has tested positive in the last 10 days please notify you surgeon.    Your procedure is scheduled on:  Monday  September 07, 2024  Report to [facility] Main Entrance: [address] entrance where the [employer] is available.   Report to admitting at: 09:00  AM  Call this number if you have any questions or problems the morning of surgery [phone]  Do not eat food after Midnight the night prior to your surgery/procedure.  After Midnight you may have the following liquids until  08:30 AM DAY OF SURGERY  Clear Liquid Diet Water  Black Coffee (sugar ok, NO MILK/CREAM OR CREAMERS)  Tea (sugar ok, NO MILK/CREAM OR CREAMERS) regular and decaf                             Plain Jell-O  with no fruit (NO RED)                                           Fruit ices  (not with fruit pulp, NO RED)                                     Popsicles (NO RED)                                                                  Juice: NO CITRUS JUICES: only apple, WHITE grape, WHITE cranberry Sports drinks like Gatorade or Powerade (NO RED)                   The day of surgery:  Drink ONE (1) Pre-Surgery G2 at  08:30  AM the morning of surgery. Drink in one sitting. Do not sip.  This drink was given to you during your hospital pre-op appointment visit. Nothing else to drink after completing the  Pre-Surgery G2 : No candy, chewing gum or throat lozenges.    FOLLOW ANY ADDITIONAL PRE OP INSTRUCTIONS YOU RECEIVED FROM YOUR SURGEON'S OFFICE!!!   Oral Hygiene is also important to reduce your risk of infection.        Remember - BRUSH YOUR TEETH THE MORNING OF SURGERY WITH YOUR REGULAR TOOTHPASTE  Do NOT smoke after Midnight the night before surgery.  STOP TAKING all Vitamins, Herbs and supplements 1 week before your surgery.   ZEPBOUND - do not use this medication until after your surgery. You will get further instructions at discharge.   ELIQUIS - Stop taking 72 hours before your surgery. Last dose will be taken on Thursday 09-03-2024  Take ONLY these medicines the morning of surgery with A SIP OF WATER : Metoprolol , Sertraline , Dronedarone  (Multaq ), and Alprazolam  if needed. You may take EITHER Hydrocodone  APAP OR Tylenol  depending on level of pain. You may use your Xopenex  inhaler if needed.   DO NOT TAKE FUROSEMIDE  on the day of surgery.   If You have been diagnosed with Sleep Apnea - Bring CPAP mask and tubing day of surgery. We will provide you with a CPAP machine on the day of your surgery.                   You may not have any metal on your body including hair pins, jewelry, and body piercing  Do not wear make-up, lotions, powders, perfumes  or deodorant  Do not wear nail polish including gel and S&S, artificial / acrylic nails, or any other type of  covering on natural nails including finger and toenails. If you have artificial nails, gel coating, etc., that needs to be removed by a nail salon, Please have this removed prior to surgery. Not doing so may mean that your surgery could be cancelled or delayed if the Surgeon or anesthesia staff feels like they are unable to monitor you safely.   Do not shave 48 hours prior to surgery to avoid nicks in your skin which may contribute to postoperative infections.   Contacts, Hearing Aids, dentures or bridgework may not be worn into surgery. DENTURES WILL BE REMOVED PRIOR TO SURGERY PLEASE DO NOT APPLY Poly grip OR ADHESIVES!!!  You may bring a small overnight bag with you on the day of surgery, only pack items that are not valuable. [facility] IS NOT RESPONSIBLE   FOR VALUABLES THAT ARE LOST OR STOLEN.   Do not bring your home medications to the hospital. The Pharmacy will dispense medications listed on your medication list to you during your admission in the Hospital.   Please read over the following fact sheets you were given: IF YOU HAVE QUESTIONS ABOUT YOUR PRE-OP INSTRUCTIONS, PLEASE CALL [phone].      Pre-operative 4 CHG Bath Instructions   You can play a key role in reducing the risk of infection after surgery. Your skin needs to be as free of germs as possible. You can reduce the number of germs on your skin by washing with CHG (chlorhexidine  gluconate) soap before surgery. CHG is an antiseptic soap that kills germs and continues to kill germs even after washing.   DO NOT use if you have an allergy  to chlorhexidine /CHG or antibacterial soaps. If your skin becomes reddened or irritated, stop using the CHG and notify one of our RNs at   Please shower with the CHG soap starting 4 days before surgery using the following schedule: Thursday  09-03-2024    Please keep in  mind the following:  DO NOT shave, including legs and underarms, starting the day of your first shower.   You  may shave your face at any point before/day of surgery.  Place clean sheets on your bed the day you start using CHG soap. Use a clean washcloth (not used since being washed) for each shower. DO NOT sleep with pets once you start using the CHG.  CHG Shower Instructions:  If you choose to wash your hair and private area, wash first with your normal shampoo/soap.  After you use shampoo/soap, rinse your hair and body thoroughly to remove shampoo/soap residue.  Turn the water  OFF and apply about 3 tablespoons (45 ml) of CHG soap to a CLEAN washcloth.  Apply CHG soap ONLY FROM YOUR NECK DOWN TO YOUR TOES (washing for 3-5 minutes)  DO NOT use CHG soap on face, private areas, open wounds, or sores.  Pay special attention to the area where your surgery is being performed.  If you are having back surgery, having someone wash your back for you may be helpful. Wait 2 minutes after CHG soap is applied, then you may rinse off the CHG soap.  Pat dry with a clean towel  Put on clean clothes/pajamas   If you choose to wear lotion, please use ONLY the CHG-compatible lotions on the back of this paper.     Additional instructions for the day of surgery: DO NOT APPLY any CHG Soap,  lotions, deodorants, cologne, or perfumes on the day of surgery  Put on clean/comfortable clothes.  Brush your teeth.  Ask your nurse before applying any prescription medications to the skin.   CHG Compatible Lotions   Aveeno Moisturizing lotion  Cetaphil Moisturizing Cream  Cetaphil Moisturizing Lotion  Clairol Herbal Essence Moisturizing Lotion, Dry Skin  Clairol Herbal Essence Moisturizing Lotion, Extra Dry Skin  Clairol Herbal Essence Moisturizing Lotion, Normal Skin  Curel Age Defying Therapeutic Moisturizing Lotion with Alpha Hydroxy  Curel Extreme Care Body Lotion  Curel Soothing Hands Moisturizing Hand Lotion  Curel Therapeutic Moisturizing Cream, Fragrance-Free  Curel Therapeutic Moisturizing Lotion,  Fragrance-Free  Curel Therapeutic Moisturizing Lotion, Original Formula  Eucerin Daily Replenishing Lotion  Eucerin Dry Skin Therapy Plus Alpha Hydroxy Crme  Eucerin Dry Skin Therapy Plus Alpha Hydroxy Lotion  Eucerin Original Crme  Eucerin Original Lotion  Eucerin Plus Crme Eucerin Plus Lotion  Eucerin TriLipid Replenishing Lotion  Keri Anti-Bacterial Hand Lotion  Keri Deep Conditioning Original Lotion Dry Skin Formula Softly Scented  Keri Deep Conditioning Original Lotion, Fragrance Free Sensitive Skin Formula  Keri Lotion Fast Absorbing Fragrance Free Sensitive Skin Formula  Keri Lotion Fast Absorbing Softly Scented Dry Skin Formula  Keri Original Lotion  Keri Skin Renewal Lotion Keri Silky Smooth Lotion  Keri Silky Smooth Sensitive Skin Lotion  Nivea Body Creamy Conditioning Oil  Nivea Body Extra Enriched Lotion  Nivea Body Original Lotion  Nivea Body Sheer Moisturizing Lotion Nivea Crme  Nivea Skin Firming Lotion  NutraDerm 30 Skin Lotion  NutraDerm Skin Lotion  NutraDerm Therapeutic Skin Cream  NutraDerm Therapeutic Skin Lotion  ProShield Protective Hand Cream  Provon moisturizing lotion   FAILURE TO FOLLOW THESE INSTRUCTIONS MAY RESULT IN THE CANCELLATION OF YOUR SURGERY  PATIENT SIGNATURE_________________________________  NURSE SIGNATURE__________________________________  ________________________________________________________________________       Amanda Wilkins    An incentive spirometer is a tool that can help keep your lungs clear and active. This tool measures how well you are filling your lungs with each breath. Taking long deep  breaths may help reverse or decrease the chance of developing breathing (pulmonary) problems (especially infection) following: A long period of time when you are unable to move or be active. BEFORE THE PROCEDURE  If the spirometer includes an indicator to show your best effort, your nurse or respiratory therapist will  set it to a desired goal. If possible, sit up straight or lean slightly forward. Try not to slouch. Hold the incentive spirometer in an upright position. INSTRUCTIONS FOR USE  Sit on the edge of your bed if possible, or sit up as far as you can in bed or on a chair. Hold the incentive spirometer in an upright position. Breathe out normally. Place the mouthpiece in your mouth and seal your lips tightly around it. Breathe in slowly and as deeply as possible, raising the piston or the ball toward the top of the column. Hold your breath for 3-5 seconds or for as long as possible. Allow the piston or ball to fall to the bottom of the column. Remove the mouthpiece from your mouth and breathe out normally. Rest for a few seconds and repeat Steps 1 through 7 at least 10 times every 1-2 hours when you are awake. Take your time and take a few normal breaths between deep breaths. The spirometer may include an indicator to show your best effort. Use the indicator as a goal to work toward during each repetition. After each set of 10 deep breaths, practice coughing to be sure your lungs are clear. If you have an incision (the cut made at the time of surgery), support your incision when coughing by placing a pillow or rolled up towels firmly against it. Once you are able to get out of bed, walk around indoors and cough well. You may stop using the incentive spirometer when instructed by your caregiver.  RISKS AND COMPLICATIONS Take your time so you do not get dizzy or light-headed. If you are in pain, you may need to take or ask for pain medication before doing incentive spirometry. It is harder to take a deep breath if you are having pain. AFTER USE Rest and breathe slowly and easily. It can be helpful to keep track of a log of your progress. Your caregiver can provide you with a simple table to help with this. If you are using the spirometer at home, follow these instructions: SEEK MEDICAL CARE IF:  You  are having difficultly using the spirometer. You have trouble using the spirometer as often as instructed. Your pain medication is not giving enough relief while using the spirometer. You develop fever of 100.5 F (38.1 C) or higher.                                                                                                    SEEK IMMEDIATE MEDICAL CARE IF:  You cough up bloody sputum that had not been present before. You develop fever of 102 F (38.9 C) or greater. You develop worsening pain at or near the incision site. MAKE SURE YOU:  Understand these instructions. Will watch your condition. Will get  help right away if you are not doing well or get worse. Document Released: 02/11/2007 Document Revised: 12/24/2011 Document Reviewed: 04/14/2007 [facility] Patient Information 2014 [place], [place].        If you would like to see a video about joint replacement:   [email or web address]

## 2024-08-25 NOTE — Progress Notes (Signed)
 COVID Vaccine received:  []  No [x]  Yes Date of any COVID positive Test in last 90 days:  PCP - Karlynn Noel, MD  Cardiologist - Vina Gull, MD   Orren Fabry, PA-C cardiac clearance in 08-19-24 Phone note EP- Soyla Norton, MD  Rheumatology- Jon Jacob, MD   Chest x-ray - 04-24-24  2v,   07-07-24  1v   EKG -  08-10-2024 Stress Test - 05-31-2020   ECHO - 06-25-2024 Cardiac Cath -  CT Coronary Calcium  score:  Zio Monitor- 08-06-2024   Epic  Pacemaker / ICD device []  No []  Yes   Spinal Cord Stimulator:[]  No []  Yes       History of Sleep Apnea? []  No [x]  Yes   CPAP used?- []  No []  Yes    Patient has: []  NO Hx DM   [x]  Pre-DM   []  DM1  []   DM2 Does the patient monitor blood sugar?   []  N/A   [x]  No []  Yes  Last A1c was:  5.7  on   10-30-2023     Zepbound  - already on hold   Blood Thinner / Instructions: ELIQUIS   hold x 72 hours  last dose will be taken on Thursday  09-03-24 Aspirin  Instructions:  Comments:   Activity level: Able to walk up 2 flights of stairs without becoming significantly short of breath or having chest pain?  []  No   []    Yes  Patient can perform ADLs without assistance. []  No   []   Yes  Anesthesia review: CAD, A.fib, HTN, RA, Pre-DM, Migraines, OSA-CPAP,   Patient denies any S&S of respiratory illness or Covid - no shortness of breath, fever, cough or chest pain at PAT appointment.  Patient verbalized understanding and agreement to the Pre-Surgical Instructions that were given to them at this PAT appointment. Patient was also educated of the need to review these PAT instructions again prior to her surgery.I reviewed the appropriate phone numbers to call if they have any and questions or concerns.

## 2024-08-26 ENCOUNTER — Encounter (HOSPITAL_COMMUNITY): Payer: Self-pay

## 2024-08-26 ENCOUNTER — Telehealth: Payer: Self-pay | Admitting: Nurse Practitioner

## 2024-08-26 ENCOUNTER — Encounter (HOSPITAL_COMMUNITY)
Admission: RE | Admit: 2024-08-26 | Discharge: 2024-08-26 | Disposition: A | Discharge status: Home / Self Care | Source: Ambulatory Visit | Attending: Orthopedic Surgery | Admitting: Orthopedic Surgery

## 2024-08-26 ENCOUNTER — Other Ambulatory Visit: Payer: Self-pay

## 2024-08-26 VITALS — BP 126/55 | HR 70 | Temp 98.1°F | Resp 20 | Ht 65.0 in | Wt 233.7 lb

## 2024-08-26 DIAGNOSIS — Z01818 Encounter for other preprocedural examination: Secondary | ICD-10-CM

## 2024-08-26 DIAGNOSIS — R7303 Prediabetes: Secondary | ICD-10-CM | POA: Diagnosis not present

## 2024-08-26 DIAGNOSIS — Z01812 Encounter for preprocedural laboratory examination: Secondary | ICD-10-CM | POA: Diagnosis not present

## 2024-08-26 DIAGNOSIS — Z79899 Other long term (current) drug therapy: Secondary | ICD-10-CM | POA: Insufficient documentation

## 2024-08-26 DIAGNOSIS — M1712 Unilateral primary osteoarthritis, left knee: Secondary | ICD-10-CM | POA: Diagnosis not present

## 2024-08-26 HISTORY — DX: Prediabetes: R73.03

## 2024-08-26 LAB — COMPREHENSIVE METABOLIC PANEL WITH GFR
ALT: 15 U/L (ref 0–44)
AST: 23 U/L (ref 15–41)
Albumin: 4.4 g/dL (ref 3.5–5.0)
Alkaline Phosphatase: 84 U/L (ref 38–126)
Anion gap: 9 (ref 5–15)
BUN: 17 mg/dL (ref 8–23)
CO2: 24 mmol/L (ref 22–32)
Calcium: 9.6 mg/dL (ref 8.9–10.3)
Chloride: 93 mmol/L — ABNORMAL LOW (ref 98–111)
Creatinine, Ser: 0.86 mg/dL (ref 0.44–1.00)
GFR, Estimated: >60 mL/min (ref >60)
Glucose, Bld: 86 mg/dL (ref 70–99)
Potassium: 5.3 mmol/L — ABNORMAL HIGH (ref 3.5–5.1)
Sodium: 125 mmol/L — ABNORMAL LOW (ref 135–145)
Total Bilirubin: 0.7 mg/dL (ref 0.0–1.2)
Total Protein: 7.3 g/dL (ref 6.5–8.1)

## 2024-08-26 LAB — CBC
HCT: 34 % — ABNORMAL LOW (ref 36.0–46.0)
Hemoglobin: 11.7 g/dL — ABNORMAL LOW (ref 12.0–15.0)
MCH: 32.8 pg (ref 26.0–34.0)
MCHC: 34.4 g/dL (ref 30.0–36.0)
MCV: 95.2 fL (ref 80.0–100.0)
Platelets: 191 K/uL (ref 150–400)
RBC: 3.57 MIL/uL — ABNORMAL LOW (ref 3.87–5.11)
RDW: 12.8 % (ref 11.5–15.5)
WBC: 5.6 K/uL (ref 4.0–10.5)
nRBC: 0 % (ref 0.0–0.2)

## 2024-08-26 LAB — SURGICAL PCR SCREEN
MRSA, PCR: NEGATIVE
Staphylococcus aureus: NEGATIVE

## 2024-08-26 NOTE — Telephone Encounter (Signed)
   Pt called this evening b/c she had pre-op lab work done earlier today @ [facility] in preparation for knee surgery on 11/24.  She just checked her mychart and noted that Na was low @ 135 w/ K of 5.3, and Cl of 93.  She is asymptomatic.  Upon questioning, she does report having taken 40mg  of lasix  yesterday, which she only uses on a prn basis, b/c of 3 lbs wt gain.  She has not had any dyspnea or significant edema and following lasix , she noted brisk diuresis.  In light of recent mild volume overload followed by diuresis, I recommended that she will require a repeat BMET, which she will arrange through Dr. Daralyn office tomorrow.  If she is persistently hyponatremic, she will require in-office visit to assess volume and further lab evaluation.  Caller verbalized understanding and was grateful for the call back.  Lonni Meager, NP 08/26/2024, 7:50 PM

## 2024-08-27 ENCOUNTER — Telehealth: Payer: Self-pay

## 2024-08-27 NOTE — Telephone Encounter (Signed)
 Copied from CRM #[id number]. Topic: Clinical - Medical Advice >> Aug 27, 2024  1:39 PM Brittany M wrote: Reason for CRM: Needing to speak about getting sodium levels up- Having a knee replacement on 11/24. Asking for a nurse to call her

## 2024-08-27 NOTE — Telephone Encounter (Signed)
 Please help support patient with the MULTAQ  PAP process by filling out the provider portion on chart media. Please ensure that the provider portion has been signed and dated (no stamps). Please fax the completed provider page to [phone].

## 2024-08-27 NOTE — Progress Notes (Signed)
 Anesthesia Chart Review   Case: [id number] Date/Time: 09/07/24 1120   Procedure: ARTHROPLASTY, KNEE, TOTAL (Left: Knee)   Anesthesia type: Choice   Pre-op diagnosis: Left knee osteoarthritis   Location: [facility] ROOM 09 / [facility]   Surgeons: Melodi Lerner, MD       DISCUSSION:78 y.o. never smoker with h/o HTN, OSA on CPAP, PAF, CAD, left knee OA scheduled for above procedure 09/07/24 with Dr. Lerner Melodi.   Pt with chronic hyponatremia, 125 at PAT. Advised to follow up with PCP prior to surgery for management and repeat labs.   Pt seen by PCP 08/28/2024. Hyponatremia with unclear etiology . Advised to start furosemide  daily, hold potassium and repeat labs. Labs 09/04/2024 with Sodium 131 and potassium wnl.   Per cardiology preoperative evaluation 08/19/2024, Chart reviewed as part of pre-operative protocol coverage. Patient was contacted 08/19/2024 in reference to pre-operative risk assessment for pending surgery as outlined below.  Amanda Wilkins was last seen on 08/10/24 by Dr. Inocencio.  Since that day, Amanda Wilkins has done well overall. She is still having some breakthrough palpitations.     No chest pain or SOB, no swelling in her feet or hands. She does have a persistent cough, phlegm builds up and its persistent, happening for some times, went to an ENT.    She is now scheduled for left total knee arthoplasty on 11/24. Doing well overall on the Multaq . She was asked to reach out if the palpitations become more frequent or are lasting longer or if she has any changes in her symptoms.    Her BP was low one time but has been better.  137/73 today HR 81   Per office protocol, patient can hold Eliquis  for 3 days prior to procedure. Restart when its safe to do.    Therefore, based on ACC/AHA guidelines, the patient would be at acceptable risk for the planned procedure without further cardiovascular testing.  Pt reports last dose of Eliquis  09/03/2024.  VS: BP (!) 126/55  Comment: right arm sitting  Pulse 70   Temp 36.7 C (Oral)   Resp 20   Ht 5' 5 (1.651 m)   Wt 106 kg   SpO2 97%   BMI 38.89 kg/m   PROVIDERS: Plotnikov, Karlynn [name], MD is PCP  Cardiologist - Vina Gull, MD   LABS: Labs reviewed: Acceptable for surgery. and labs forwarded to pcp (all labs ordered are listed, but only abnormal results are displayed)  Labs Reviewed  COMPREHENSIVE METABOLIC PANEL WITH GFR - Abnormal; Notable for the following components:      Result Value   Sodium 125 (*)    Potassium 5.3 (*)    Chloride 93 (*)    All other components within normal limits  CBC - Abnormal; Notable for the following components:   RBC 3.57 (*)    Hemoglobin 11.7 (*)    HCT 34.0 (*)    All other components within normal limits  SURGICAL PCR SCREEN     IMAGES:   EKG:   CV: Echo 06/25/24 1. Left ventricular ejection fraction, by estimation, is 55 to 60%. The  left ventricle has normal function. The left ventricle has no regional  wall motion abnormalities. Left ventricular diastolic parameters were  normal.   2. Right ventricular systolic function is normal. The right ventricular  size is normal.   3. The mitral valve is normal in structure. Mild to moderate mitral valve  regurgitation. No evidence of mitral stenosis.   4. The aortic  valve is normal in structure. Aortic valve regurgitation is  not visualized. No aortic stenosis is present.   5. The inferior vena cava is normal in size with greater than 50%  respiratory variability, suggesting right atrial pressure of 3 mmHg.  Past Medical History:  Diagnosis Date   Acute bronchitis 09/11/2016   11/17 refractory   Acute cystitis without hematuria 05/17/2015   Acute kidney injury    Labs today   Acute right ankle pain 09/09/2018   Anticoagulant long-term use    Xarelto    Axillary pain    Bronchitis    Cellulitis    Cholecystitis    Chronic interstitial nephritis    CONJUNCTIVITIS, ACUTE 10/18/2010   Qualifier:  Diagnosis of  By: Plotnikov MD, Karlynn [name]    Coronary artery disease    Mild   D-dimer, elevated    Depression    Dysrhythmia    A. Fib/A. Flutter, AVT   Eye inflammation    GERD (gastroesophageal reflux disease)    History of interstitial nephritis 2016   chronic   History of kidney stones    has a small one found on xray   History of nuclear stress test 12/16/2014   Intermediate risk nuclear study w/ medium size moderate severity reversible defect in the basal and mid inferolateral and inferior wall (per dr cardiology note , dr vina gull did not think this was consistent with ischemia)/  normal LV function and wall motion, ef 75%   History of septic shock 01/22/2015   in setting Group A Strep Cellulits erysipelas/chest wall induration with streptoccocus basterium-- Severe sepsis, DIC, Acute respiratory failure with pulmonary edema, Acute Kidney failure with chronic interstitial nephritis   Hypertension    Hyponatremia    Migraines    on Zoloft  for migraines   Mild carotid artery disease    per duplex 08-30-2017 bilateral ICA 1-39%   OA (osteoarthritis) rheumotologist-  dr jon jacob   both knees,  shoulders, ankles   OSA on CPAP     moderate obstructive sleep apnea with an AHI of 23.4/h and oxygen  desaturations as low as 84%.  Now on CPAP at 12 cm H2O.   PAF (paroxysmal atrial fibrillation) [facility]) 2009   cardiologist-- dr vina gull   Paroxysmal atrial flutter [facility])    a. dx 11/2017.   Pre-diabetes    PSVT (paroxysmal supraventricular tachycardia)    RA (rheumatoid arthritis) (HCC)    Wears contact lenses     Past Surgical History:  Procedure Laterality Date   BREAST BIOPSY Right 05/15/2017   PASH   CARDIOVERSION N/A 08/28/2021   Procedure: CARDIOVERSION;  Surgeon: Gull Vina [name], MD;  Location: [facility] ENDOSCOPY;  Service: Cardiovascular;  Laterality: N/A;   CESAREAN SECTION  1980   CHOLECYSTECTOMY N/A 11/16/2021   Procedure: LAPAROSCOPIC CHOLECYSTECTOMY;  Surgeon:  Rubin Calamity, MD;  Location: [facility] OR;  Service: General;  Laterality: N/A;   COLONOSCOPY     ESOPHAGOGASTRODUODENOSCOPY N/A 02/08/2015   Procedure: ESOPHAGOGASTRODUODENOSCOPY (EGD);  Surgeon: Lamar JONETTA Aho, MD;  Location: [facility] ENDOSCOPY;  Service: Endoscopy;  Laterality: N/A;   KNEE ARTHROSCOPY W/ MENISCAL REPAIR Left 08/2014    @[facility]    SHOULDER SURGERY Right 04/04/2016   TOTAL HIP ARTHROPLASTY Right 06/19/2022   Procedure: RIGHT TOTAL HIP ARTHROPLASTY ANTERIOR APPROACH;  Surgeon: Vernetta Lonni GRADE, MD;  Location: [facility] OR;  Service: Orthopedics;  Laterality: Right;   TOTAL KNEE ARTHROPLASTY Right 09/01/2018   Procedure: RIGHT TOTAL KNEE ARTHROPLASTY;  Surgeon: Rubie Kemps, MD;  Location: [facility]S;  Service: Orthopedics;  Laterality: Right;    MEDICATIONS:  ALPRAZolam  (XANAX ) 0.25 MG tablet   cholecalciferol  (VITAMIN D3) 25 MCG (1000 UT) tablet   dronedarone  (MULTAQ ) 400 MG tablet   ELIQUIS  5 MG TABS tablet   famotidine  (PEPCID ) 20 MG tablet   furosemide  (LASIX ) 40 MG tablet   hydrALAZINE  (APRESOLINE ) 10 MG tablet   HYDROcodone -acetaminophen  (NORCO/VICODIN) 5-325 MG tablet   hydroxychloroquine  (PLAQUENIL ) 200 MG tablet   levalbuterol  (XOPENEX  HFA) 45 MCG/ACT inhaler   metoprolol  tartrate (LOPRESSOR ) 25 MG tablet   potassium chloride  (KLOR-CON ) 10 MEQ tablet   rosuvastatin  (CRESTOR ) 5 MG tablet   sertraline  (ZOLOFT ) 50 MG tablet   tirzepatide  (ZEPBOUND ) 2.5 MG/0.5ML Pen   triamcinolone  ointment (KENALOG ) 0.1 %   TYLENOL  500 MG tablet   No current facility-administered medications for this encounter.     Harlene Hoots Ward, PA-C [facility] Pre-Surgical Testing [phone]

## 2024-08-28 ENCOUNTER — Ambulatory Visit (INDEPENDENT_AMBULATORY_CARE_PROVIDER_SITE_OTHER): Admitting: Internal Medicine

## 2024-08-28 ENCOUNTER — Encounter: Payer: Self-pay | Admitting: Internal Medicine

## 2024-08-28 VITALS — BP 124/58 | HR 67 | Ht 65.0 in | Wt 232.0 lb

## 2024-08-28 DIAGNOSIS — E875 Hyperkalemia: Secondary | ICD-10-CM | POA: Diagnosis not present

## 2024-08-28 DIAGNOSIS — E871 Hypo-osmolality and hyponatremia: Secondary | ICD-10-CM

## 2024-08-28 DIAGNOSIS — I5032 Chronic diastolic (congestive) heart failure: Secondary | ICD-10-CM | POA: Diagnosis not present

## 2024-08-28 DIAGNOSIS — R710 Precipitous drop in hematocrit: Secondary | ICD-10-CM

## 2024-08-28 DIAGNOSIS — I4891 Unspecified atrial fibrillation: Secondary | ICD-10-CM

## 2024-08-28 NOTE — Assessment & Plan Note (Addendum)
 Start Furosemide  every day starting today 40-20-20 mg Hold potassium for now (due to elevated K).  Elevated potassium could be an artifact. Labs on Monday next week

## 2024-08-28 NOTE — Patient Instructions (Addendum)
  Start Furosemide  every day starting today 40-20-20 mg Hold potassium for now (due to elevated K) Labs on Monday

## 2024-08-28 NOTE — Progress Notes (Signed)
 Subjective:  Patient ID: Amanda Wilkins, female    DOB: [birth year]  Age: 78 y.o. MRN: [id number]  CC: Post-op Problem (Sodium and potassium levels post operation)   HPI Amanda Wilkins presents for low sodium and high potassium on pre-op labs for L knee TKA on 09/07/24.  We were asked to correct her electrolyte imbalance prior to surgery.  Amanda Wilkins has been taking furosemide  as needed.  She has been taking potassium only with furosemide .  She is not drinking excessive amounts of fluids.  She has no symptoms. She stopped taking hydralazine .  She is not taking Zepbound  injections.  Her new medication is Multaq .  Outpatient Medications Prior to Visit  Medication Sig Dispense Refill   ALPRAZolam  (XANAX ) 0.25 MG tablet Take 1 tablet (0.25 mg total) by mouth 2 (two) times daily as needed for anxiety. 60 tablet 3   cholecalciferol  (VITAMIN D3) 25 MCG (1000 UT) tablet Take 1,000 Units by mouth daily.     dronedarone  (MULTAQ ) 400 MG tablet Take 1 tablet (400 mg total) by mouth 2 (two) times daily with a meal.     ELIQUIS  5 MG TABS tablet TAKE 1 TABLET(5 MG) BY MOUTH TWICE DAILY (Patient taking differently: Take 5 mg by mouth See admin instructions. Take 5 mg by mouth in the morning and evening) 60 tablet 5   famotidine  (PEPCID ) 20 MG tablet TAKE 1 TABLET(20 MG) BY MOUTH AT BEDTIME (Patient taking differently: Take 20 mg by mouth at bedtime.) 90 tablet 3   furosemide  (LASIX ) 40 MG tablet Take 1 tablet (40 mg total) by mouth daily as needed for fluid or edema. 90 tablet 3   HYDROcodone -acetaminophen  (NORCO/VICODIN) 5-325 MG tablet Take 1 tablet by mouth every 6 (six) hours as needed for severe pain (pain score 7-10). 20 tablet 0   hydroxychloroquine  (PLAQUENIL ) 200 MG tablet Take 400 mg by mouth in the morning.     levalbuterol  (XOPENEX  HFA) 45 MCG/ACT inhaler Inhale 2 puffs into the lungs every 6 (six) hours as needed for wheezing. 1 each 12   metoprolol  tartrate (LOPRESSOR ) 25 MG tablet Take  0.5 tablets (12.5 mg total) by mouth 2 (two) times daily.     potassium chloride  (KLOR-CON ) 10 MEQ tablet Take 2 tablets by mouth daily (Patient taking differently: Take 20 mEq by mouth See admin instructions. Take 20 mEq by mouth only on days when Lasix  is used) 180 tablet 3   rosuvastatin  (CRESTOR ) 5 MG tablet Take 1 tablet (5 mg total) by mouth daily. 90 tablet 3   sertraline  (ZOLOFT ) 50 MG tablet TAKE 1 TABLET BY MOUTH DAILY (Patient taking differently: Take 50 mg by mouth in the morning.) 90 tablet 1   triamcinolone  ointment (KENALOG ) 0.1 % Apply 1 Application topically 2 (two) times daily as needed (for irritation- affected site).     TYLENOL  500 MG tablet Take 1,000 mg by mouth every 6 (six) hours as needed for mild pain (pain score 1-3).     tirzepatide  (ZEPBOUND ) 2.5 MG/0.5ML Pen Inject 2.5 mg into the skin once a week. (Patient not taking: Reported on 08/28/2024) 2 mL 3   hydrALAZINE  (APRESOLINE ) 10 MG tablet Take 1 tablet (10 mg total) by mouth every 8 (eight) hours. (Patient not taking: Reported on 08/28/2024) 270 tablet 3   No facility-administered medications prior to visit.    ROS: Review of Systems  Constitutional:  Negative for activity change, appetite change, chills, fatigue and unexpected weight change.  HENT:  Negative for congestion, mouth sores and sinus  pressure.   Eyes:  Negative for visual disturbance.  Respiratory:  Negative for cough and chest tightness.   Gastrointestinal:  Negative for abdominal pain and nausea.  Genitourinary:  Negative for difficulty urinating, frequency and vaginal pain.  Musculoskeletal:  Negative for back pain and gait problem.  Skin:  Negative for pallor and rash.  Neurological:  Negative for dizziness, tremors, weakness, numbness and headaches.  Psychiatric/Behavioral:  Negative for confusion and sleep disturbance.     Objective:  BP (!) 124/58   Pulse 67   Ht 5' 5 (1.651 m)   Wt 232 lb (105.2 kg)   SpO2 96%   BMI 38.61 kg/m    BP Readings from Last 3 Encounters:  08/28/24 (!) 124/58  08/26/24 (!) 126/55  08/10/24 120/82    Wt Readings from Last 3 Encounters:  08/28/24 232 lb (105.2 kg)  08/26/24 233 lb 11 oz (106 kg)  08/10/24 235 lb (106.6 kg)    Physical Exam Constitutional:      General: She is not in acute distress.    Appearance: She is well-developed.  HENT:     Head: Normocephalic.     Right Ear: External ear normal.     Left Ear: External ear normal.     Nose: Nose normal.  Eyes:     General:        Right eye: No discharge.        Left eye: No discharge.     Conjunctiva/sclera: Conjunctivae normal.     Pupils: Pupils are equal, round, and reactive to light.  Neck:     Thyroid : No thyromegaly.     Vascular: No JVD.     Trachea: No tracheal deviation.  Cardiovascular:     Rate and Rhythm: Normal rate and regular rhythm.     Heart sounds: Normal heart sounds.  Pulmonary:     Effort: No respiratory distress.     Breath sounds: No stridor. No wheezing.  Abdominal:     General: Bowel sounds are normal. There is no distension.     Palpations: Abdomen is soft. There is no mass.     Tenderness: There is no abdominal tenderness. There is no guarding or rebound.  Musculoskeletal:        General: No tenderness.     Cervical back: Normal range of motion and neck supple. No rigidity.  Lymphadenopathy:     Cervical: No cervical adenopathy.  Skin:    Findings: No erythema or rash.  Neurological:     Mental Status: She is oriented to person, place, and time.     Cranial Nerves: No cranial nerve deficit.     Motor: No abnormal muscle tone.     Coordination: Coordination normal.     Deep Tendon Reflexes: Reflexes normal.  Psychiatric:        Behavior: Behavior normal.        Thought Content: Thought content normal.        Judgment: Judgment normal.     Lab Results  Component Value Date   WBC 5.6 08/26/2024   HGB 11.7 (L) 08/26/2024   HCT 34.0 (L) 08/26/2024   PLT 191 08/26/2024    GLUCOSE 86 08/26/2024   CHOL 168 03/06/2022   TRIG 52 03/06/2022   HDL 98 03/06/2022   LDLCALC 59 03/06/2022   ALT 15 08/26/2024   AST 23 08/26/2024   NA 125 (L) 08/26/2024   K 5.3 (H) 08/26/2024   CL 93 (L) 08/26/2024   CREATININE  0.86 08/26/2024   BUN 17 08/26/2024   CO2 24 08/26/2024   TSH 3.750 07/29/2024   INR 1.1 07/07/2022   HGBA1C 5.7 10/30/2023    No results found.  Assessment & Plan:   Problem List Items Addressed This Visit     Atrial fibrillation with RVR (HCC)   Rate controlled.  Multaq , metoprolol       Chronic diastolic CHF (congestive heart failure) (HCC)   Compensated      Hyperkalemia   Start Furosemide  every day starting today 40-20-20 mg Hold potassium for now (due to elevated K).  Elevated potassium could be an artifact. Labs on Monday next week      Hyponatremia - Primary   Worse on pre-op labs.  Unclear etiology.  Amanda Wilkins has been taking furosemide  as needed.  She has been taking potassium only with furosemide .  She is not drinking excessive amounts of fluids.  She has no symptoms. She stopped taking hydralazine .  She is not taking Zepbound  injections.  Her new medication is Multaq . Start Furosemide  every day starting today 40-20-20 mg Hold potassium for now (due to elevated K) Labs on Monday next week      Relevant Orders   Comprehensive metabolic panel with GFR   Magnesium    CBC with Differential/Platelet   Iron, TIBC and Ferritin Panel   Other Visit Diagnoses       Decreased hemoglobin       Relevant Orders   CBC with Differential/Platelet   Iron, TIBC and Ferritin Panel         No orders of the defined types were placed in this encounter.     Follow-up: Return for a follow-up visit.  Marolyn Noel, MD

## 2024-08-28 NOTE — Telephone Encounter (Signed)
 Returned patients call.  All questions (if any) were answered. Joshua Dalton Seip, CMA 08/28/2024 12:15 PM

## 2024-08-28 NOTE — Assessment & Plan Note (Addendum)
 Worse on pre-op labs.  Unclear etiology.  Amanda Wilkins has been taking furosemide  as needed.  She has been taking potassium only with furosemide .  She is not drinking excessive amounts of fluids.  She has no symptoms. She stopped taking hydralazine .  She is not taking Zepbound  injections.  Her new medication is Multaq . Start Furosemide  every day starting today 40-20-20 mg Hold potassium for now (due to elevated K) Labs on Monday next week

## 2024-08-30 NOTE — Assessment & Plan Note (Signed)
 Compensated

## 2024-08-30 NOTE — Assessment & Plan Note (Signed)
 Rate controlled.  Multaq , metoprolol 

## 2024-08-31 ENCOUNTER — Other Ambulatory Visit (INDEPENDENT_AMBULATORY_CARE_PROVIDER_SITE_OTHER)

## 2024-08-31 DIAGNOSIS — E871 Hypo-osmolality and hyponatremia: Secondary | ICD-10-CM | POA: Diagnosis not present

## 2024-08-31 DIAGNOSIS — R710 Precipitous drop in hematocrit: Secondary | ICD-10-CM | POA: Diagnosis not present

## 2024-08-31 LAB — CBC WITH DIFFERENTIAL/PLATELET
Basophils Absolute: 0 K/uL (ref 0.0–0.1)
Basophils Relative: 1.2 % (ref 0.0–3.0)
Eosinophils Absolute: 0.2 K/uL (ref 0.0–0.7)
Eosinophils Relative: 5.3 % — ABNORMAL HIGH (ref 0.0–5.0)
HCT: 34.9 % — ABNORMAL LOW (ref 36.0–46.0)
Hemoglobin: 12 g/dL (ref 12.0–15.0)
Lymphocytes Relative: 24.4 % (ref 12.0–46.0)
Lymphs Abs: 1 K/uL (ref 0.7–4.0)
MCHC: 34.2 g/dL (ref 30.0–36.0)
MCV: 96.7 fl (ref 78.0–100.0)
Monocytes Absolute: 0.4 K/uL (ref 0.1–1.0)
Monocytes Relative: 9.5 % (ref 3.0–12.0)
Neutro Abs: 2.5 K/uL (ref 1.4–7.7)
Neutrophils Relative %: 59.6 % (ref 43.0–77.0)
Platelets: 193 K/uL (ref 150.0–400.0)
RBC: 3.61 Mil/uL — ABNORMAL LOW (ref 3.87–5.11)
RDW: 14.2 % (ref 11.5–15.5)
WBC: 4.2 K/uL (ref 4.0–10.5)

## 2024-08-31 LAB — COMPREHENSIVE METABOLIC PANEL WITH GFR
ALT: 14 U/L (ref 0–35)
AST: 20 U/L (ref 0–37)
Albumin: 4.3 g/dL (ref 3.5–5.2)
Alkaline Phosphatase: 64 U/L (ref 39–117)
BUN: 17 mg/dL (ref 6–23)
CO2: 24 meq/L (ref 19–32)
Calcium: 9.3 mg/dL (ref 8.4–10.5)
Chloride: 95 meq/L — ABNORMAL LOW (ref 96–112)
Creatinine, Ser: 0.86 mg/dL (ref 0.40–1.20)
GFR: 65.02 mL/min (ref >60.00)
Glucose, Bld: 82 mg/dL (ref 70–99)
Potassium: 3.8 meq/L (ref 3.5–5.1)
Sodium: 129 meq/L — ABNORMAL LOW (ref 135–145)
Total Bilirubin: 0.7 mg/dL (ref 0.2–1.2)
Total Protein: 7.2 g/dL (ref 6.0–8.3)

## 2024-08-31 LAB — MAGNESIUM: Magnesium: 2.1 mg/dL (ref 1.5–2.5)

## 2024-09-01 ENCOUNTER — Ambulatory Visit: Payer: Self-pay | Admitting: Internal Medicine

## 2024-09-01 DIAGNOSIS — E875 Hyperkalemia: Secondary | ICD-10-CM

## 2024-09-01 DIAGNOSIS — E871 Hypo-osmolality and hyponatremia: Secondary | ICD-10-CM

## 2024-09-01 LAB — IRON,TIBC AND FERRITIN PANEL
%SAT: 26 % (ref 16–45)
Ferritin: 137 ng/mL (ref 16–288)
Iron: 85 ug/dL (ref 45–160)
TIBC: 325 ug/dL (ref 250–450)

## 2024-09-02 NOTE — Telephone Encounter (Signed)
 Physician portion completed/signed. Faxed to requested number, confirmation received.

## 2024-09-04 ENCOUNTER — Other Ambulatory Visit (INDEPENDENT_AMBULATORY_CARE_PROVIDER_SITE_OTHER)

## 2024-09-04 DIAGNOSIS — E871 Hypo-osmolality and hyponatremia: Secondary | ICD-10-CM | POA: Diagnosis not present

## 2024-09-04 DIAGNOSIS — E875 Hyperkalemia: Secondary | ICD-10-CM | POA: Diagnosis not present

## 2024-09-04 DIAGNOSIS — K810 Acute cholecystitis: Secondary | ICD-10-CM | POA: Diagnosis not present

## 2024-09-04 LAB — COMPREHENSIVE METABOLIC PANEL WITH GFR
ALT: 15 U/L (ref 0–35)
AST: 21 U/L (ref 0–37)
Albumin: 4.3 g/dL (ref 3.5–5.2)
Alkaline Phosphatase: 65 U/L (ref 39–117)
BUN: 16 mg/dL (ref 6–23)
CO2: 25 meq/L (ref 19–32)
Calcium: 9.4 mg/dL (ref 8.4–10.5)
Chloride: 98 meq/L (ref 96–112)
Creatinine, Ser: 0.86 mg/dL (ref 0.40–1.20)
GFR: 65.01 mL/min (ref >60.00)
Glucose, Bld: 91 mg/dL (ref 70–99)
Potassium: 4.1 meq/L (ref 3.5–5.1)
Sodium: 131 meq/L — ABNORMAL LOW (ref 135–145)
Total Bilirubin: 0.8 mg/dL (ref 0.2–1.2)
Total Protein: 7.3 g/dL (ref 6.0–8.3)

## 2024-09-04 LAB — HEPATIC FUNCTION PANEL
ALT: 15 U/L (ref 0–35)
AST: 21 U/L (ref 0–37)
Albumin: 4.3 g/dL (ref 3.5–5.2)
Alkaline Phosphatase: 65 U/L (ref 39–117)
Bilirubin, Direct: 0.1 mg/dL (ref 0.0–0.3)
Total Bilirubin: 0.8 mg/dL (ref 0.2–1.2)
Total Protein: 7.3 g/dL (ref 6.0–8.3)

## 2024-09-04 NOTE — Telephone Encounter (Addendum)
 Received. Section 4 Treatment and Prescribing information still missing. Updating missing information before faxing to company.

## 2024-09-04 NOTE — Anesthesia Preprocedure Evaluation (Signed)
 Anesthesia Evaluation  Patient identified by MRN, date of birth, ID band Patient awake    Reviewed: Allergy  & Precautions, NPO status , Patient's Chart, lab work & pertinent test results  History of Anesthesia Complications Negative for: history of anesthetic complications  Airway Mallampati: II  TM Distance: >3 FB Neck ROM: Full    Dental no notable dental hx. (+) Teeth Intact, Dental Advisory Given   Pulmonary sleep apnea and Continuous Positive Airway Pressure Ventilation    Pulmonary exam normal breath sounds clear to auscultation       Cardiovascular hypertension, + CAD  Normal cardiovascular exam+ dysrhythmias (on apixiban) Atrial Fibrillation  Rhythm:Regular Rate:Normal  Echo 06/25/24 1. Left ventricular ejection fraction, by estimation, is 55 to 60%. The  left ventricle has normal function. The left ventricle has no regional  wall motion abnormalities. Left ventricular diastolic parameters were  normal.   2. Right ventricular systolic function is normal. The right ventricular  size is normal.   3. The mitral valve is normal in structure. Mild to moderate mitral valve  regurgitation. No evidence of mitral stenosis.   4. The aortic valve is normal in structure. Aortic valve regurgitation is  not visualized. No aortic stenosis is present.   5. The inferior vena cava is normal in size with greater than 50%  respiratory variability, suggesting right atrial pressure of 3 mmHg.     Neuro/Psych  Headaches  Neuromuscular disease    GI/Hepatic ,GERD  Medicated and Controlled,,  Endo/Other    Renal/GU Lab Results      Component                Value               Date                          K                        4.1                 09/04/2024                CO2                      25                  09/04/2024                BUN                      16                  09/04/2024                CREATININE                0.86                09/04/2024                     Musculoskeletal  (+) Arthritis ,    Abdominal   Peds  Hematology Lab Results      Component                Value               Date  WBC                      4.2                 08/31/2024                HGB                      12.0                08/31/2024                HCT                      34.9 (L)            08/31/2024                MCV                      96.7                08/31/2024                PLT                      193.0               08/31/2024              Anesthesia Other Findings   Reproductive/Obstetrics                              Anesthesia Physical Anesthesia Plan  ASA: 3  Anesthesia Plan: Spinal and Regional   Post-op Pain Management: Regional block* and Ofirmev  IV (intra-op)*   Induction: Intravenous  PONV Risk Score and Plan: 2 and Treatment may vary due to age or medical condition, Ondansetron  and Propofol  infusion  Airway Management Planned: Nasal Cannula, Natural Airway and Simple Face Mask  Additional Equipment: None  Intra-op Plan:   Post-operative Plan: Extubation in OR  Informed Consent: I have reviewed the patients History and Physical, chart, labs and discussed the procedure including the risks, benefits and alternatives for the proposed anesthesia with the patient or authorized representative who has indicated his/her understanding and acceptance.     Dental advisory given  Plan Discussed with: CRNA and Anesthesiologist  Anesthesia Plan Comments: (See PAT note 08/26/2024  Spinal + L adductor)         Anesthesia Quick Evaluation

## 2024-09-04 NOTE — Telephone Encounter (Signed)
 Completed missing information and form has been faxed to company.

## 2024-09-05 ENCOUNTER — Ambulatory Visit: Payer: Self-pay | Admitting: Internal Medicine

## 2024-09-07 ENCOUNTER — Observation Stay (HOSPITAL_COMMUNITY)
Admission: RE | Admit: 2024-09-07 | Discharge: 2024-09-09 | Disposition: A | Discharge status: Home / Self Care | Source: Ambulatory Visit | Attending: Orthopedic Surgery | Admitting: Orthopedic Surgery

## 2024-09-07 ENCOUNTER — Ambulatory Visit (HOSPITAL_COMMUNITY): Payer: Self-pay | Admitting: Medical

## 2024-09-07 ENCOUNTER — Encounter (HOSPITAL_COMMUNITY): Payer: Self-pay | Admitting: Orthopedic Surgery

## 2024-09-07 ENCOUNTER — Ambulatory Visit (HOSPITAL_BASED_OUTPATIENT_CLINIC_OR_DEPARTMENT_OTHER): Payer: Self-pay | Admitting: Anesthesiology

## 2024-09-07 ENCOUNTER — Other Ambulatory Visit: Payer: Self-pay

## 2024-09-07 ENCOUNTER — Encounter (HOSPITAL_COMMUNITY)
Admission: RE | Disposition: A | Discharge status: Home / Self Care | Payer: Self-pay | Source: Ambulatory Visit | Attending: Orthopedic Surgery

## 2024-09-07 DIAGNOSIS — G8918 Other acute postprocedural pain: Secondary | ICD-10-CM | POA: Diagnosis not present

## 2024-09-07 DIAGNOSIS — M1712 Unilateral primary osteoarthritis, left knee: Principal | ICD-10-CM | POA: Insufficient documentation

## 2024-09-07 DIAGNOSIS — I5032 Chronic diastolic (congestive) heart failure: Secondary | ICD-10-CM | POA: Diagnosis not present

## 2024-09-07 DIAGNOSIS — Z79899 Other long term (current) drug therapy: Secondary | ICD-10-CM | POA: Insufficient documentation

## 2024-09-07 DIAGNOSIS — Z96641 Presence of right artificial hip joint: Secondary | ICD-10-CM | POA: Diagnosis not present

## 2024-09-07 DIAGNOSIS — I480 Paroxysmal atrial fibrillation: Secondary | ICD-10-CM | POA: Diagnosis not present

## 2024-09-07 DIAGNOSIS — Z96651 Presence of right artificial knee joint: Secondary | ICD-10-CM | POA: Diagnosis not present

## 2024-09-07 DIAGNOSIS — I110 Hypertensive heart disease with heart failure: Secondary | ICD-10-CM | POA: Diagnosis not present

## 2024-09-07 DIAGNOSIS — I10 Essential (primary) hypertension: Secondary | ICD-10-CM

## 2024-09-07 DIAGNOSIS — I4891 Unspecified atrial fibrillation: Secondary | ICD-10-CM | POA: Diagnosis not present

## 2024-09-07 DIAGNOSIS — I2510 Atherosclerotic heart disease of native coronary artery without angina pectoris: Secondary | ICD-10-CM

## 2024-09-07 DIAGNOSIS — M179 Osteoarthritis of knee, unspecified: Principal | ICD-10-CM | POA: Diagnosis present

## 2024-09-07 DIAGNOSIS — M25562 Pain in left knee: Secondary | ICD-10-CM | POA: Diagnosis present

## 2024-09-07 DIAGNOSIS — Z7901 Long term (current) use of anticoagulants: Secondary | ICD-10-CM | POA: Insufficient documentation

## 2024-09-07 HISTORY — PX: TOTAL KNEE ARTHROPLASTY: SHX125

## 2024-09-07 SURGERY — ARTHROPLASTY, KNEE, TOTAL
Anesthesia: Regional | Site: Knee | Laterality: Left

## 2024-09-07 MED ORDER — BISACODYL 10 MG RE SUPP: 10.0000 mg | Freq: Every day | RECTAL | Status: DC | PRN

## 2024-09-07 MED ORDER — SODIUM CHLORIDE 0.9 % IR SOLN
Status: DC | PRN
Start: 2024-09-07 — End: 2024-09-07
  Administered 2024-09-07: 1000 mL

## 2024-09-07 MED ORDER — HYDROMORPHONE HCL 1 MG/ML IJ SOLN: 0.2500 mg | INTRAMUSCULAR | Status: DC | PRN

## 2024-09-07 MED ORDER — ONDANSETRON HCL 4 MG PO TABS: 4.0000 mg | ORAL_TABLET | Freq: Four times a day (QID) | ORAL | Status: DC | PRN

## 2024-09-07 MED ORDER — TRANEXAMIC ACID-NACL 1000-0.7 MG/100ML-% IV SOLN
1000.0000 mg | INTRAVENOUS | Status: AC
  Administered 2024-09-07: 1000 mg via INTRAVENOUS
  Filled 2024-09-07: qty 100

## 2024-09-07 MED ORDER — BUPIVACAINE LIPOSOME 1.3 % IJ SUSP
INTRAMUSCULAR | Status: DC | PRN
  Administered 2024-09-07: 20 mL

## 2024-09-07 MED ORDER — ACETAMINOPHEN 10 MG/ML IV SOLN
INTRAVENOUS | Status: AC
  Filled 2024-09-07: qty 100

## 2024-09-07 MED ORDER — FUROSEMIDE 40 MG PO TABS
40.0000 mg | ORAL_TABLET | Freq: Every day | ORAL | Status: DC | PRN
Start: 2024-09-08 — End: 2024-09-09

## 2024-09-07 MED ORDER — OXYCODONE HCL 5 MG/5ML PO SOLN: 5.0000 mg | Freq: Once | ORAL | Status: DC | PRN

## 2024-09-07 MED ORDER — METOCLOPRAMIDE HCL 5 MG PO TABS: 5.0000 mg | ORAL_TABLET | Freq: Three times a day (TID) | ORAL | Status: DC | PRN

## 2024-09-07 MED ORDER — HYDROXYCHLOROQUINE SULFATE 200 MG PO TABS
400.0000 mg | ORAL_TABLET | Freq: Every day | ORAL | Status: DC
  Administered 2024-09-08 – 2024-09-09 (×2): 400 mg via ORAL
  Filled 2024-09-07 (×2): qty 2

## 2024-09-07 MED ORDER — ROPIVACAINE HCL 5 MG/ML IJ SOLN
INTRAMUSCULAR | Status: DC | PRN
  Administered 2024-09-07: 25 mL via PERINEURAL

## 2024-09-07 MED ORDER — CLONIDINE HCL (ANALGESIA) 100 MCG/ML EP SOLN
EPIDURAL | Status: DC | PRN
Start: 2024-09-07 — End: 2024-09-07
  Administered 2024-09-07: 100 ug

## 2024-09-07 MED ORDER — LACTATED RINGERS IV SOLN: INTRAVENOUS | Status: DC

## 2024-09-07 MED ORDER — 0.9 % SODIUM CHLORIDE (POUR BTL) OPTIME
TOPICAL | Status: DC | PRN
  Administered 2024-09-07: 1000 mL

## 2024-09-07 MED ORDER — LIDOCAINE HCL (PF) 2 % IJ SOLN
INTRAMUSCULAR | Status: AC
  Filled 2024-09-07: qty 5

## 2024-09-07 MED ORDER — ONDANSETRON HCL 4 MG/2ML IJ SOLN
INTRAMUSCULAR | Status: DC | PRN
  Administered 2024-09-07: 4 mg via INTRAVENOUS

## 2024-09-07 MED ORDER — DRONEDARONE HCL 400 MG PO TABS
400.0000 mg | ORAL_TABLET | Freq: Two times a day (BID) | ORAL | Status: DC
  Administered 2024-09-07 – 2024-09-09 (×4): 400 mg via ORAL
  Filled 2024-09-07 (×5): qty 1

## 2024-09-07 MED ORDER — SODIUM CHLORIDE (PF) 0.9 % IJ SOLN
INTRAMUSCULAR | Status: DC | PRN
Start: 2024-09-07 — End: 2024-09-07
  Administered 2024-09-07: 60 mL

## 2024-09-07 MED ORDER — ACETAMINOPHEN 10 MG/ML IV SOLN
1000.0000 mg | Freq: Four times a day (QID) | INTRAVENOUS | Status: DC
  Administered 2024-09-07: 1000 mg via INTRAVENOUS

## 2024-09-07 MED ORDER — SODIUM CHLORIDE 0.9 % IV SOLN: INTRAVENOUS | Status: DC

## 2024-09-07 MED ORDER — STERILE WATER FOR IRRIGATION IR SOLN
Status: DC | PRN
  Administered 2024-09-07: 2000 mL

## 2024-09-07 MED ORDER — LEVALBUTEROL TARTRATE 45 MCG/ACT IN AERO: 2.0000 | INHALATION_SPRAY | Freq: Four times a day (QID) | RESPIRATORY_TRACT | Status: DC | PRN

## 2024-09-07 MED ORDER — SERTRALINE HCL 50 MG PO TABS
50.0000 mg | ORAL_TABLET | Freq: Every day | ORAL | Status: DC
  Administered 2024-09-08 – 2024-09-09 (×2): 50 mg via ORAL
  Filled 2024-09-07 (×2): qty 1

## 2024-09-07 MED ORDER — POTASSIUM CHLORIDE ER 10 MEQ PO TBCR: 20.0000 meq | EXTENDED_RELEASE_TABLET | ORAL | Status: DC

## 2024-09-07 MED ORDER — ORAL CARE MOUTH RINSE: 15.0000 mL | Freq: Once | OROMUCOSAL | Status: AC

## 2024-09-07 MED ORDER — DOCUSATE SODIUM 100 MG PO CAPS
100.0000 mg | ORAL_CAPSULE | Freq: Two times a day (BID) | ORAL | Status: DC
  Administered 2024-09-07 – 2024-09-09 (×4): 100 mg via ORAL
  Filled 2024-09-07 (×4): qty 1

## 2024-09-07 MED ORDER — HYDROCODONE-ACETAMINOPHEN 7.5-325 MG PO TABS
1.0000 | ORAL_TABLET | ORAL | Status: DC | PRN
  Administered 2024-09-09 (×2): 1 via ORAL
  Filled 2024-09-07 (×2): qty 1

## 2024-09-07 MED ORDER — ROSUVASTATIN CALCIUM 5 MG PO TABS
5.0000 mg | ORAL_TABLET | Freq: Every day | ORAL | Status: DC
  Administered 2024-09-08 – 2024-09-09 (×2): 5 mg via ORAL
  Filled 2024-09-07 (×2): qty 1

## 2024-09-07 MED ORDER — ONDANSETRON HCL 4 MG/2ML IJ SOLN: 4.0000 mg | Freq: Four times a day (QID) | INTRAMUSCULAR | Status: DC | PRN

## 2024-09-07 MED ORDER — ACETAMINOPHEN 10 MG/ML IV SOLN: 1000.0000 mg | Freq: Once | INTRAVENOUS | Status: DC | PRN

## 2024-09-07 MED ORDER — SODIUM CHLORIDE (PF) 0.9 % IJ SOLN
INTRAMUSCULAR | Status: AC
  Filled 2024-09-07: qty 50

## 2024-09-07 MED ORDER — ALBUTEROL SULFATE (2.5 MG/3ML) 0.083% IN NEBU: 2.5000 mg | INHALATION_SOLUTION | Freq: Four times a day (QID) | RESPIRATORY_TRACT | Status: DC | PRN

## 2024-09-07 MED ORDER — PHENOL 1.4 % MT LIQD: 1.0000 | OROMUCOSAL | Status: DC | PRN

## 2024-09-07 MED ORDER — METOPROLOL TARTRATE 12.5 MG HALF TABLET
12.5000 mg | ORAL_TABLET | Freq: Two times a day (BID) | ORAL | Status: DC
  Administered 2024-09-07 – 2024-09-09 (×4): 12.5 mg via ORAL
  Filled 2024-09-07 (×4): qty 1

## 2024-09-07 MED ORDER — DEXAMETHASONE SOD PHOSPHATE PF 10 MG/ML IJ SOLN
8.0000 mg | Freq: Once | INTRAMUSCULAR | Status: AC
Start: 2024-09-07 — End: 2024-09-07
  Administered 2024-09-07: 8 mg via INTRAVENOUS

## 2024-09-07 MED ORDER — FENTANYL CITRATE (PF) 50 MCG/ML IJ SOSY
50.0000 ug | PREFILLED_SYRINGE | Freq: Once | INTRAMUSCULAR | Status: DC
  Filled 2024-09-07: qty 2

## 2024-09-07 MED ORDER — BUPIVACAINE LIPOSOME 1.3 % IJ SUSP
INTRAMUSCULAR | Status: AC
  Filled 2024-09-07: qty 20

## 2024-09-07 MED ORDER — PHENYLEPHRINE HCL-NACL 20-0.9 MG/250ML-% IV SOLN
INTRAVENOUS | Status: DC | PRN
  Administered 2024-09-07: 30 ug/min via INTRAVENOUS

## 2024-09-07 MED ORDER — APIXABAN 2.5 MG PO TABS
2.5000 mg | ORAL_TABLET | Freq: Two times a day (BID) | ORAL | Status: DC
  Administered 2024-09-08 – 2024-09-09 (×3): 2.5 mg via ORAL
  Filled 2024-09-07 (×3): qty 1

## 2024-09-07 MED ORDER — POLYETHYLENE GLYCOL 3350 17 G PO PACK: 17.0000 g | PACK | Freq: Every day | ORAL | Status: DC | PRN

## 2024-09-07 MED ORDER — CEFAZOLIN SODIUM-DEXTROSE 2-4 GM/100ML-% IV SOLN
2.0000 g | INTRAVENOUS | Status: AC
  Administered 2024-09-07: 2 g via INTRAVENOUS

## 2024-09-07 MED ORDER — MEPIVACAINE HCL (PF) 2 % IJ SOLN
INTRAMUSCULAR | Status: DC | PRN
  Administered 2024-09-07: 64 mg via INTRATHECAL

## 2024-09-07 MED ORDER — ACETAMINOPHEN 500 MG PO TABS
500.0000 mg | ORAL_TABLET | Freq: Four times a day (QID) | ORAL | Status: AC
  Administered 2024-09-08: 500 mg via ORAL
  Filled 2024-09-07: qty 1

## 2024-09-07 MED ORDER — LIDOCAINE HCL (CARDIAC) PF 100 MG/5ML IV SOSY
PREFILLED_SYRINGE | INTRAVENOUS | Status: DC | PRN
  Administered 2024-09-07: 30 mg via INTRAVENOUS

## 2024-09-07 MED ORDER — CHLORHEXIDINE GLUCONATE 0.12 % MT SOLN
15.0000 mL | Freq: Once | OROMUCOSAL | Status: AC
Start: 2024-09-07 — End: 2024-09-07
  Administered 2024-09-07: 15 mL via OROMUCOSAL

## 2024-09-07 MED ORDER — FENTANYL CITRATE (PF) 100 MCG/2ML IJ SOLN
INTRAMUSCULAR | Status: AC
  Filled 2024-09-07: qty 2

## 2024-09-07 MED ORDER — MIDAZOLAM HCL (PF) 2 MG/2ML IJ SOLN: 1.0000 mg | Freq: Once | INTRAMUSCULAR | Status: DC

## 2024-09-07 MED ORDER — OXYCODONE HCL 5 MG PO TABS: 5.0000 mg | ORAL_TABLET | Freq: Once | ORAL | Status: DC | PRN

## 2024-09-07 MED ORDER — FLEET ENEMA RE ENEM: 1.0000 | ENEMA | Freq: Once | RECTAL | Status: DC | PRN

## 2024-09-07 MED ORDER — ONDANSETRON HCL 4 MG/2ML IJ SOLN: 4.0000 mg | Freq: Once | INTRAMUSCULAR | Status: DC | PRN

## 2024-09-07 MED ORDER — MORPHINE SULFATE (PF) 2 MG/ML IV SOLN: 1.0000 mg | INTRAVENOUS | Status: DC | PRN

## 2024-09-07 MED ORDER — DEXMEDETOMIDINE HCL IN NACL 80 MCG/20ML IV SOLN
INTRAVENOUS | Status: DC | PRN
  Administered 2024-09-07: 8 ug via INTRAVENOUS

## 2024-09-07 MED ORDER — MENTHOL 3 MG MT LOZG: 1.0000 | LOZENGE | OROMUCOSAL | Status: DC | PRN

## 2024-09-07 MED ORDER — CEFAZOLIN SODIUM-DEXTROSE 2-4 GM/100ML-% IV SOLN
2.0000 g | Freq: Four times a day (QID) | INTRAVENOUS | Status: AC
  Administered 2024-09-07 (×2): 2 g via INTRAVENOUS
  Filled 2024-09-07 (×2): qty 100

## 2024-09-07 MED ORDER — FENTANYL CITRATE (PF) 100 MCG/2ML IJ SOLN
INTRAMUSCULAR | Status: DC | PRN
  Administered 2024-09-07 (×2): 50 ug via INTRAVENOUS

## 2024-09-07 MED ORDER — POTASSIUM CHLORIDE ER 10 MEQ PO TBCR: 20.0000 meq | EXTENDED_RELEASE_TABLET | Freq: Every day | ORAL | Status: DC | PRN

## 2024-09-07 MED ORDER — ONDANSETRON HCL 4 MG/2ML IJ SOLN
INTRAMUSCULAR | Status: AC
  Filled 2024-09-07: qty 2

## 2024-09-07 MED ORDER — POVIDONE-IODINE 10 % EX SWAB: 2.0000 | Freq: Once | CUTANEOUS | Status: DC

## 2024-09-07 MED ORDER — FAMOTIDINE 20 MG PO TABS
20.0000 mg | ORAL_TABLET | Freq: Every day | ORAL | Status: DC
  Administered 2024-09-07 – 2024-09-08 (×2): 20 mg via ORAL
  Filled 2024-09-07 (×2): qty 1

## 2024-09-07 MED ORDER — PROPOFOL 500 MG/50ML IV EMUL
INTRAVENOUS | Status: DC | PRN
  Administered 2024-09-07: 60 ug/kg/min via INTRAVENOUS
  Administered 2024-09-07: 50 ug/kg/min via INTRAVENOUS

## 2024-09-07 MED ORDER — SODIUM CHLORIDE (PF) 0.9 % IJ SOLN
INTRAMUSCULAR | Status: AC
  Filled 2024-09-07: qty 10

## 2024-09-07 MED ORDER — DEXAMETHASONE SOD PHOSPHATE PF 10 MG/ML IJ SOLN
10.0000 mg | Freq: Once | INTRAMUSCULAR | Status: AC
  Administered 2024-09-08: 10 mg via INTRAVENOUS

## 2024-09-07 MED ORDER — BUPIVACAINE LIPOSOME 1.3 % IJ SUSP: 20.0000 mL | Freq: Once | INTRAMUSCULAR | Status: DC

## 2024-09-07 MED ORDER — DIPHENHYDRAMINE HCL 12.5 MG/5ML PO ELIX: 12.5000 mg | ORAL_SOLUTION | ORAL | Status: DC | PRN

## 2024-09-07 MED ORDER — METOCLOPRAMIDE HCL 5 MG/ML IJ SOLN: 5.0000 mg | Freq: Three times a day (TID) | INTRAMUSCULAR | Status: DC | PRN

## 2024-09-07 MED ORDER — HYDROCODONE-ACETAMINOPHEN 5-325 MG PO TABS
1.0000 | ORAL_TABLET | ORAL | Status: DC | PRN
  Administered 2024-09-07: 1 via ORAL
  Administered 2024-09-07: 2 via ORAL
  Administered 2024-09-07 – 2024-09-08 (×2): 1 via ORAL
  Administered 2024-09-08: 2 via ORAL
  Filled 2024-09-07 (×2): qty 1
  Filled 2024-09-07: qty 2
  Filled 2024-09-07: qty 1
  Filled 2024-09-07: qty 2

## 2024-09-07 SURGICAL SUPPLY — 44 items
ATTUNE PSFEM LTSZ6 NARCEM KNEE (Femur) IMPLANT
ATTUNE PSRP INSR SZ6 7 KNEE (Insert) IMPLANT
BAG COUNTER SPONGE SURGICOUNT (BAG) IMPLANT
BAG ZIPLOCK 12X15 (MISCELLANEOUS) ×1 IMPLANT
BASE TIBIAL ROT PLAT SZ 5 KNEE (Knees) IMPLANT
BLADE SAG 18X100X1.27 (BLADE) ×1 IMPLANT
BLADE SAW SGTL 11.0X1.19X90.0M (BLADE) ×1 IMPLANT
BNDG ELASTIC 6INX 5YD STR LF (GAUZE/BANDAGES/DRESSINGS) ×1 IMPLANT
BOWL SMART MIX CTS (DISPOSABLE) ×1 IMPLANT
CEMENT HV SMART SET (Cement) ×2 IMPLANT
COVER SURGICAL LIGHT HANDLE (MISCELLANEOUS) ×1 IMPLANT
CUFF TRNQT CYL 34X4.125X (TOURNIQUET CUFF) ×1 IMPLANT
DERMABOND ADVANCED .7 DNX12 (GAUZE/BANDAGES/DRESSINGS) ×1 IMPLANT
DRAPE U-SHAPE 47X51 STRL (DRAPES) ×1 IMPLANT
DRSG AQUACEL AG ADV 3.5X10 (GAUZE/BANDAGES/DRESSINGS) ×1 IMPLANT
DRSG EMULSION OIL 3X16 NADH (GAUZE/BANDAGES/DRESSINGS) IMPLANT
DURAPREP 26ML APPLICATOR (WOUND CARE) ×1 IMPLANT
ELECT REM PT RETURN 15FT ADLT (MISCELLANEOUS) ×1 IMPLANT
GAUZE PAD ABD 8X10 STRL (GAUZE/BANDAGES/DRESSINGS) IMPLANT
GAUZE SPONGE 4X4 12PLY STRL (GAUZE/BANDAGES/DRESSINGS) IMPLANT
GLOVE BIO SURGEON STRL SZ 6.5 (GLOVE) IMPLANT
GLOVE BIO SURGEON STRL SZ8 (GLOVE) ×1 IMPLANT
GLOVE BIOGEL PI IND STRL 7.0 (GLOVE) ×1 IMPLANT
GLOVE BIOGEL PI IND STRL 8 (GLOVE) ×1 IMPLANT
GOWN STRL REUS W/ TWL LRG LVL3 (GOWN DISPOSABLE) ×1 IMPLANT
HOLDER FOLEY CATH W/STRAP (MISCELLANEOUS) ×1 IMPLANT
IMMOBILIZER KNEE 20 THIGH 36 (SOFTGOODS) ×1 IMPLANT
KIT TURNOVER KIT A (KITS) ×1 IMPLANT
MANIFOLD NEPTUNE II (INSTRUMENTS) ×1 IMPLANT
NS IRRIG 1000ML POUR BTL (IV SOLUTION) ×1 IMPLANT
PACK TOTAL KNEE CUSTOM (KITS) ×1 IMPLANT
PADDING CAST COTTON 6X4 STRL (CAST SUPPLIES) ×2 IMPLANT
PATELLA MEDIAL ATTUN 35MM KNEE (Knees) IMPLANT
PENCIL SMOKE EVACUATOR (MISCELLANEOUS) ×1 IMPLANT
PIN STEINMAN FIXATION KNEE (PIN) IMPLANT
PROTECTOR NERVE ULNAR (MISCELLANEOUS) ×1 IMPLANT
SET HNDPC FAN SPRY TIP SCT (DISPOSABLE) ×1 IMPLANT
SUT MNCRL AB 4-0 PS2 18 (SUTURE) ×1 IMPLANT
SUT VIC AB 2-0 CT1 TAPERPNT 27 (SUTURE) ×3 IMPLANT
SUTURE STRATFX 0 PDS 27 VIOLET (SUTURE) ×1 IMPLANT
TOWEL GREEN STERILE FF (TOWEL DISPOSABLE) ×1 IMPLANT
TRAY FOLEY MTR SLVR 16FR STAT (SET/KITS/TRAYS/PACK) ×1 IMPLANT
TUBE SUCTION HIGH CAP CLEAR NV (SUCTIONS) ×1 IMPLANT
WRAP KNEE MAXI GEL POST OP (GAUZE/BANDAGES/DRESSINGS) ×1 IMPLANT

## 2024-09-07 NOTE — Plan of Care (Signed)

## 2024-09-07 NOTE — Evaluation (Signed)
 Physical Therapy Evaluation Patient Details Name: Amanda Wilkins MRN: [id number] DOB: [birth year] Today's Date: 09/07/2024  History of Present Illness  78 yo female s/p L TKA 09/07/24. PMH: vertigo, afib, TKA 2019, R hip bursitis, OA, shingles, HTN  Clinical Impression  Pt is s/p TKA resulting in the deficits listed below (see PT Problem List).  Pt reporting pain in posterior mid thigh, requesting dressing to be loosened. Ace wrap loosened without improvement in pain.  Pt was premedicated prior to PT. Agreeable to mobilize.  Amb ~ 64' with RW and CGA. Tol well, reported some pain relief end of session after bed shortened and pillow placed under ankles. Pt verbalizes importance of terminal knee extension.    Pt will benefit from acute skilled PT to increase their independence and safety with mobility to allow discharge.          If plan is discharge home, recommend the following: A little help with walking and/or transfers;A little help with bathing/dressing/bathroom;Assist for transportation;Help with stairs or ramp for entrance;Assistance with cooking/housework   Can travel by private vehicle        Equipment Recommendations None recommended by PT  Recommendations for Other Services       Functional Status Assessment Patient has had a recent decline in their functional status and demonstrates the ability to make significant improvements in function in a reasonable and predictable amount of time.     Precautions / Restrictions Precautions Precautions: Fall;Knee Restrictions LLE Weight Bearing Per Provider Order: Weight bearing as tolerated      Mobility  Bed Mobility Overal bed mobility: Needs Assistance Bed Mobility: Supine to Sit, Sit to Supine     Supine to sit: Supervision Sit to supine: Min assist   General bed mobility comments: assist to lift LLE on to bed    Transfers Overall transfer level: Needs assistance Equipment used: Rolling walker (2  wheels) Transfers: Sit to/from Stand Sit to Stand: Min assist           General transfer comment: cues for hand placement and LE position    Ambulation/Gait Ambulation/Gait assistance: Contact guard assist, Min assist Gait Distance (Feet): 60 Feet Assistive device: Rolling walker (2 wheels) Gait Pattern/deviations: Step-to pattern       General Gait Details: cues for  sequence and RW position  Stairs            Wheelchair Mobility     Tilt Bed    Modified Rankin (Stroke Patients Only)       Balance Overall balance assessment: Mild deficits observed, not formally tested                                           Pertinent Vitals/Pain Pain Assessment Pain Assessment: 0-10 Pain Score: 6  Pain Location: L knee Pain Descriptors / Indicators: Aching, Sore Pain Intervention(s): Limited activity within patient's tolerance, Monitored during session, Premedicated before session, Repositioned    Home Living Family/patient expects to be discharged to:: Private residence Living Arrangements: Spouse/significant other Available Help at Discharge: Family Type of Home: House Home Access: Stairs to enter Entrance Stairs-Rails: Right Entrance Stairs-Number of Steps: 5   Home Layout: Two level;Able to live on main level with bedroom/bathroom Home Equipment: Rolling Walker (2 wheels);Cane - single point;BSC/3in1 Additional Comments: 2 walkers    Prior Function Prior Level of Function : Independent/Modified Independent  Mobility Comments: independent ADLs Comments: independent     Extremity/Trunk Assessment   Upper Extremity Assessment Upper Extremity Assessment: Overall WFL for tasks assessed    Lower Extremity Assessment Lower Extremity Assessment: LLE deficits/detail LLE Deficits / Details: ankle WFL, knee extension/hip flexion 2+ to 3/5, limtied by anticipated post op deficits       Communication    Communication Communication: No apparent difficulties    Cognition Arousal: Alert Behavior During Therapy: WFL for tasks assessed/performed   PT - Cognitive impairments: No apparent impairments                         Following commands: Intact       Cueing Cueing Techniques: Verbal cues     General Comments      Exercises Total Joint Exercises Ankle Circles/Pumps: AROM, Both, 5 reps Quad Sets: AROM, Both, 5 reps   Assessment/Plan    PT Assessment Patient needs continued PT services  PT Problem List         PT Treatment Interventions DME instruction;Therapeutic exercise;Gait training;Functional mobility training;Therapeutic activities;Patient/family education;Stair training    PT Goals (Current goals can be found in the Care Plan section)  Acute Rehab PT Goals Patient Stated Goal: to walk without pain PT Goal Formulation: With patient Time For Goal Achievement: 09/20/24 Potential to Achieve Goals: Good    Frequency 7X/week     Co-evaluation               AM-PAC PT 6 Clicks Mobility  Outcome Measure Help needed turning from your back to your side while in a flat bed without using bedrails?: A Little Help needed moving from lying on your back to sitting on the side of a flat bed without using bedrails?: A Little Help needed moving to and from a bed to a chair (including a wheelchair)?: A Little Help needed standing up from a chair using your arms (e.g., wheelchair or bedside chair)?: A Little Help needed to walk in hospital room?: A Little Help needed climbing 3-5 steps with a railing? : A Lot 6 Click Score: 17    End of Session Equipment Utilized During Treatment: Gait belt Activity Tolerance: Patient tolerated treatment well Patient left: in bed;with call bell/phone within reach;with bed alarm set;with family/visitor present Nurse Communication: Mobility status PT Visit Diagnosis: Other abnormalities of gait and mobility (R26.89)     Time: 8387-8362 PT Time Calculation (min) (ACUTE ONLY): 25 min   Charges:   PT Evaluation $PT Eval Low Complexity: 1 Low PT Treatments $Gait Training: 8-22 mins PT General Charges $$ ACUTE PT VISIT: 1 Visit         Rexene, PT  Acute Rehab Dept [facility]) [phone]  09/07/2024   [facility] 09/07/2024, 4:49 PM

## 2024-09-07 NOTE — Interval H&P Note (Signed)
 History and Physical Interval Note:  09/07/2024 8:29 AM  Amanda Wilkins  has presented today for surgery, with the diagnosis of Left knee osteoarthritis.  The various methods of treatment have been discussed with the patient and family. After consideration of risks, benefits and other options for treatment, the patient has consented to  Procedure(s): ARTHROPLASTY, KNEE, TOTAL (Left) as a surgical intervention.  The patient's history has been reviewed, patient examined, no change in status, stable for surgery.  I have reviewed the patient's chart and labs.  Questions were answered to the patient's satisfaction.     Dempsey Berklee Battey

## 2024-09-07 NOTE — Transfer of Care (Signed)
 Immediate Anesthesia Transfer of Care Note  Patient: Amanda Wilkins  Procedure(s) Performed: ARTHROPLASTY, KNEE, TOTAL (Left: Knee)  Patient Location: PACU  Anesthesia Type:Spinal  Level of Consciousness: awake, alert , oriented, and patient cooperative  Airway & Oxygen  Therapy: Patient Spontanous Breathing and Patient connected to face mask oxygen   Post-op Assessment: Report given to RN and Post -op Vital signs reviewed and stable  Post vital signs: Reviewed and stable  Last Vitals:  Vitals Value Taken Time  BP    Temp    Pulse 63 09/07/24 11:05  Resp 26 09/07/24 11:05  SpO2 100 % 09/07/24 11:05  Vitals shown include unfiled device data.  Last Pain:  Vitals:   09/07/24 0745  TempSrc: Oral  PainSc:          Complications: No notable events documented.

## 2024-09-07 NOTE — Progress Notes (Signed)
 Orthopedic Tech Progress Note Patient Details:  Amanda Wilkins 08/13/1946 [id number] Applied CPM per order. Will remove at 3:35 pm.  CPM Left Knee CPM Left Knee: On Left Knee Flexion (Degrees): 40 Left Knee Extension (Degrees): 10  Post Interventions Patient Tolerated: Well Instructions Provided: Adjustment of device, Care of device, Poper ambulation with device Ortho Devices Type of Ortho Device: CPM padding Ortho Device/Splint Location: LLE Ortho Device/Splint Interventions: Ordered, Application, Adjustment   Post Interventions Patient Tolerated: Well Instructions Provided: Adjustment of device, Care of device, Poper ambulation with device  Morna Pink 09/07/2024, 11:38 AM

## 2024-09-07 NOTE — Op Note (Signed)
 OPERATIVE REPORT-TOTAL KNEE ARTHROPLASTY   Pre-operative diagnosis- Osteoarthritis  Left knee(s)  Post-operative diagnosis- Osteoarthritis Left knee(s)  Procedure-  Left  Total Knee Arthroplasty  Surgeon- Dempsey [name]. Marquis Diles, MD  Assistant- Roxie Mess, PA-C   Anesthesia-  Adductor canal block and spinal  EBL-30 mL   Drains None  Tourniquet time-  Total Tourniquet Time Documented: Thigh (Left) - 41 minutes Total: Thigh (Left) - 41 minutes     Complications- None  Condition-PACU - hemodynamically stable.   Brief Clinical Note  Amanda Wilkins is a 78 y.o. year old female with end stage OA of her left knee with progressively worsening pain and dysfunction. She has constant pain, with activity and at rest and significant functional deficits with difficulties even with ADLs. She has had extensive non-op management including analgesics, injections of cortisone and viscosupplements, and home exercise program, but remains in significant pain with significant dysfunction. Radiographs show bone on bone arthritis medial and patellofemoral. She presents now for left Total Knee Arthroplasty.     Procedure in detail---   The patient is brought into the operating room and positioned supine on the operating table. After successful administration of  Adductor canal block and spinal,   a tourniquet is placed high on the  Left thigh(s) and the lower extremity is prepped and draped in the usual sterile fashion. Time out is performed by the operating team and then the  Left lower extremity is wrapped in Esmarch, knee flexed and the tourniquet inflated to 300 mmHg.       A midline incision is made with a ten blade through the subcutaneous tissue to the level of the extensor mechanism. A fresh blade is used to make a medial parapatellar arthrotomy. Soft tissue over the proximal medial tibia is subperiosteally elevated to the joint line with a knife and into the semimembranosus bursa with a Cobb  elevator. Soft tissue over the proximal lateral tibia is elevated with attention being paid to avoiding the patellar tendon on the tibial tubercle. The patella is everted, knee flexed 90 degrees and the ACL and PCL are removed. Findings are bone on bone medial and patellofemoral with large global osteophytes        The drill is used to create a starting hole in the distal femur and the canal is thoroughly irrigated with sterile saline to remove the fatty contents. The 5 degree Left  valgus alignment guide is placed into the femoral canal and the distal femoral cutting block is pinned to remove 10 mm off the distal femur. Resection is made with an oscillating saw.      The tibia is subluxed forward and the menisci are removed. The extramedullary alignment guide is placed referencing proximally at the medial aspect of the tibial tubercle and distally along the second metatarsal axis and tibial crest. The block is pinned to remove 2mm off the more deficient medial  side. Resection is made with an oscillating saw. Size 5is the most appropriate size for the tibia and the proximal tibia is prepared with the modular drill and keel punch for that size.      The femoral sizing guide is placed and size 6 is most appropriate. Rotation is marked off the epicondylar axis and confirmed by creating a rectangular flexion gap at 90 degrees. The size 6 cutting block is pinned in this rotation and the anterior, posterior and chamfer cuts are made with the oscillating saw. The intercondylar block is then placed and that cut is made.  Trial size 5 tibial component, trial size 6 narrow posterior stabilized femur and a 7  mm posterior stabilized rotating platform insert trial is placed. Full extension is achieved with excellent varus/valgus and anterior/posterior balance throughout full range of motion. The patella is everted and thickness measured to be 22 mm. Free hand resection is taken to 12 mm, a 35 template is placed, lug  holes are drilled, trial patella is placed, and it tracks normally. Osteophytes are removed off the posterior femur with the trial in place. All trials are removed and the cut bone surfaces prepared with pulsatile lavage. Cement is mixed and once ready for implantation, the size 5 tibial implant, size  6 narrow posterior stabilized femoral component, and the size 35 patella are cemented in place and the patella is held with the clamp. The trial insert is placed and the knee held in full extension. The Exparel  (20 ml mixed with 60 ml saline) is injected into the extensor mechanism, posterior capsule, medial and lateral gutters and subcutaneous tissues.  All extruded cement is removed and once the cement is hard the permanent 7 mm posterior stabilized rotating platform insert is placed into the tibial tray.      The wound is copiously irrigated with saline solution and the extensor mechanism closed with # 0 Stratofix suture. The tourniquet is released for a total tourniquet time of 41  minutes. Flexion against gravity is 125 degrees and the patella tracks normally. Subcutaneous tissue is closed with 2.0 vicryl and subcuticular with running 4.0 Monocryl. The incision is cleaned and dried and steri-strips and a bulky sterile dressing are applied. The limb is placed into a knee immobilizer and the patient is awakened and transported to recovery in stable condition.      Please note that a surgical assistant was a medical necessity for this procedure in order to perform it in a safe and expeditious manner. Surgical assistant was necessary to retract the ligaments and vital neurovascular structures to prevent injury to them and also necessary for proper positioning of the limb to allow for anatomic placement of the prosthesis.   Dempsey ROCKFORD Kadir Azucena, MD    09/07/2024, 10:39 AM

## 2024-09-07 NOTE — Progress Notes (Signed)
 Pt set up and ready cpap on home settings w/out o2 bleed in.  Pt mask and tubing.   09/07/24 2324  BiPAP/CPAP/SIPAP  $ Non-Invasive Ventilator  Non-Invasive Vent Initial  BiPAP/CPAP/SIPAP Pt Type Adult  BiPAP/CPAP/SIPAP DREAMSTATIOND  Mask Type Nasal mask  Dentures removed? Not applicable  Mask Size Small  EPAP 6 cmH2O  FiO2 (%) 21 %  Patient Home Mask Yes  Patient Home Tubing Yes  Auto Titrate No

## 2024-09-07 NOTE — Discharge Instructions (Addendum)
 Dempsey Moan, MD Total Joint Specialist [facility] [place] [address], [address] [place], [place] [address] [phone]  TOTAL KNEE REPLACEMENT POSTOPERATIVE DIRECTIONS    Knee Rehabilitation, Guidelines Following Surgery  Results after knee surgery are often greatly improved when you follow the exercise, range of motion and muscle strengthening exercises prescribed by your doctor. Safety measures are also important to protect the knee from further injury. If any of these exercises cause you to have increased pain or swelling in your knee joint, decrease the amount until you are comfortable again and slowly increase them. If you have problems or questions, call your caregiver or physical therapist for advice.   BLOOD CLOT PREVENTION Resume 5mg  Eliquis  twice daily.  Do not take any NSAIDs (Advil, Aleve, Ibuprofen, Meloxicam , etc.).  HOME CARE INSTRUCTIONS  Remove items at home which could result in a fall. This includes throw rugs or furniture in walking pathways.  ICE to the affected knee as much as tolerated. Icing helps control swelling. If the swelling is well controlled you will be more comfortable and rehab easier. Continue to use ice on the knee for pain and swelling from surgery. You may notice swelling that will progress down to the foot and ankle. This is normal after surgery. Elevate the leg when you are not up walking on it.    Continue to use the breathing machine which will help keep your temperature down. It is common for your temperature to cycle up and down following surgery, especially at night when you are not up moving around and exerting yourself. The breathing machine keeps your lungs expanded and your temperature down. Do not place pillow under the operative knee, focus on keeping the knee straight while resting  DIET You may resume your previous home diet once you are discharged from the hospital.  DRESSING / WOUND CARE / SHOWERING Keep your bulky  bandage on for 2 days. On the third post-operative day you may remove the dressing, and apply a new clean dry dressing using gauze and tape. Perform dry dressing changes daily. You may begin showering five days after surgery. DO NOT SOAK OR SUBMERGE IN THE INCISION UNDER WATER . After showering, apply a new dry dressing.  ACTIVITY For the first 5 days, the key is rest and control of pain and swelling Do your home exercises twice a day starting on post-operative day 3. On the days you go to physical therapy, just do the home exercises once that day. You should rest, ice and elevate the leg for 50 minutes out of every hour. Get up and walk/stretch for 10 minutes per hour. After 5 days you can increase your activity slowly as tolerated. Walk with your walker as instructed. Use the walker until you are comfortable transitioning to a cane. Walk with the cane in the opposite hand of the operative leg. You may discontinue the cane once you are comfortable and walking steadily. Avoid periods of inactivity such as sitting longer than an hour when not asleep. This helps prevent blood clots.  You may discontinue the knee immobilizer once you are able to perform a straight leg raise while lying down. You may resume a sexual relationship in one month or when given the OK by your doctor.  You may return to work once you are cleared by your doctor.  Do not drive a car for 6 weeks or until released by your surgeon.  Do not drive while taking narcotics.  TED HOSE STOCKINGS Wear the elastic  stockings on both legs for three weeks following surgery during the day. You may remove them at night for sleeping.  WEIGHT BEARING Weight bearing as tolerated with assist device (walker, cane, etc) as directed, use it as long as suggested by your surgeon or therapist, typically at least 4-6 weeks.  POSTOPERATIVE CONSTIPATION PROTOCOL Constipation - defined medically as fewer than three stools per week and severe constipation  as less than one stool per week.  One of the most common issues patients have following surgery is constipation.  Even if you have a regular bowel pattern at home, your normal regimen is likely to be disrupted due to multiple reasons following surgery.  Combination of anesthesia, postoperative narcotics, change in appetite and fluid intake all can affect your bowels.  In order to avoid complications following surgery, here are some recommendations in order to help you during your recovery period.  Colace (docusate) - Pick up an over-the-counter form of Colace or another stool softener and take twice a day as long as you are requiring postoperative pain medications.  Take with a full glass of water  daily.  If you experience loose stools or diarrhea, hold the colace until you stool forms back up. If your symptoms do not get better within 1 week or if they get worse, check with your doctor. Dulcolax (bisacodyl ) - Pick up over-the-counter and take as directed by the product packaging as needed to assist with the movement of your bowels.  Take with a full glass of water .  Use this product as needed if not relieved by Colace only.  MiraLax  (polyethylene glycol) - Pick up over-the-counter to have on hand. MiraLax  is a solution that will increase the amount of water  in your bowels to assist with bowel movements.  Take as directed and can mix with a glass of water , juice, soda, coffee, or tea. Take if you go more than two days without a movement. Do not use MiraLax  more than once per day. Call your doctor if you are still constipated or irregular after using this medication for 7 days in a row.  If you continue to have problems with postoperative constipation, please contact the office for further assistance and recommendations.  If you experience the worst abdominal pain ever or develop nausea or vomiting, please contact the office immediatly for further recommendations for treatment.  ITCHING If you experience  itching with your medications, try taking only a single pain pill, or even half a pain pill at a time.  You can also use Benadryl  over the counter for itching or also to help with sleep.   MEDICATIONS See your medication summary on the "After Visit Summary" that the nursing staff will review with you prior to discharge.  You may have some home medications which will be placed on hold until you complete the course of blood thinner medication.  It is important for you to complete the blood thinner medication as prescribed by your surgeon.  Continue your approved medications as instructed at time of discharge.  PRECAUTIONS If you experience chest pain or shortness of breath - call 911 immediately for transfer to the hospital emergency department.  If you develop a fever greater that 101 F, purulent drainage from wound, increased redness or drainage from wound, foul odor from the wound/dressing, or calf pain - CONTACT YOUR SURGEON.  FOLLOW-UP APPOINTMENTS Make sure you keep all of your appointments after your operation with your surgeon and caregivers. You should call the office at the above phone number and make an appointment for approximately two weeks after the date of your surgery or on the date instructed by your surgeon outlined in the After Visit Summary.  RANGE OF MOTION AND STRENGTHENING EXERCISES  Rehabilitation of the knee is important following a knee injury or an operation. After just a few days of immobilization, the muscles of the thigh which control the knee become weakened and shrink (atrophy). Knee exercises are designed to build up the tone and strength of the thigh muscles and to improve knee motion. Often times heat used for twenty to thirty minutes before working out will loosen up your tissues and help with improving the range of motion but do not use heat for the first two weeks following surgery. These exercises can be done on a  training (exercise) mat, on the floor, on a table or on a bed. Use what ever works the best and is most comfortable for you Knee exercises include:  Leg Lifts - While your knee is still immobilized in a splint or cast, you can do straight leg raises. Lift the leg to 60 degrees, hold for 3 sec, and slowly lower the leg. Repeat 10-20 times 2-3 times daily. Perform this exercise against resistance later as your knee gets better.  Quad and Hamstring Sets - Tighten up the muscle on the front of the thigh (Quad) and hold for 5-10 sec. Repeat this 10-20 times hourly. Hamstring sets are done by pushing the foot backward against an object and holding for 5-10 sec. Repeat as with quad sets.  Leg Slides: Lying on your back, slowly slide your foot toward your buttocks, bending your knee up off the floor (only go as far as is comfortable). Then slowly slide your foot back down until your leg is flat on the floor again. Angel Wings: Lying on your back spread your legs to the side as far apart as you can without causing discomfort.  A rehabilitation program following serious knee injuries can speed recovery and prevent re-injury in the future due to weakened muscles. Contact your doctor or a physical therapist for more information on knee rehabilitation.   POST-OPERATIVE OPIOID TAPER INSTRUCTIONS: It is important to wean off of your opioid medication as soon as possible. If you do not need pain medication after your surgery it is ok to stop day one. Opioids include: Codeine , Hydrocodone (Norco, Vicodin), Oxycodone (Percocet, oxycontin ) and hydromorphone  amongst others.  Long term and even short term use of opiods can cause: Increased pain response Dependence Constipation Depression Respiratory depression And more.  Withdrawal symptoms can include Flu like symptoms Nausea, vomiting And more Techniques to manage these symptoms Hydrate well Eat regular healthy meals Stay active Use relaxation techniques(deep  breathing, meditating, yoga) Do Not substitute Alcohol to help with tapering If you have been on opioids for less than two weeks and do not have pain than it is ok to stop all together.  Plan to wean off of opioids This plan should start within one week post op of your joint replacement. Maintain the same interval or time between taking each dose and first decrease the dose.  Cut the total daily intake of opioids by one tablet each day Next start to increase the time between doses. The last dose that should be eliminated is the evening dose.   IF YOU ARE TRANSFERRED TO  A SKILLED REHAB FACILITY If the patient is transferred to a skilled rehab facility following release from the hospital, a list of the current medications will be sent to the facility for the patient to continue.  When discharged from the skilled rehab facility, please have the facility set up the patient's Home Health Physical Therapy prior to being released. Also, the skilled facility will be responsible for providing the patient with their medications at time of release from the facility to include their pain medication, the muscle relaxants, and their blood thinner medication. If the patient is still at the rehab facility at time of the two week follow up appointment, the skilled rehab facility will also need to assist the patient in arranging follow up appointment in our office and any transportation needs.  MAKE SURE YOU:  Understand these instructions.  Get help right away if you are not doing well or get worse.   DENTAL ANTIBIOTICS:  In most cases prophylactic antibiotics for Dental procdeures after total joint surgery are not necessary.  Exceptions are as follows:  1. History of prior total joint infection  2. Severely immunocompromised (Organ Transplant, cancer chemotherapy, Rheumatoid biologic meds such as Humera)  3. Poorly controlled diabetes (A1C &gt; 8.0, blood glucose over 200)  If you have one of these  conditions, contact your surgeon for an antibiotic prescription, prior to your dental procedure.    Pick up stool softner and laxative for home use following surgery while on pain medications. Do not submerge incision under water . Please use good hand washing techniques while changing dressing each day. May shower starting three days after surgery. Please use a clean towel to pat the incision dry following showers. Continue to use ice for pain and swelling after surgery. Do not use any lotions or creams on the incision until instructed by your surgeon.

## 2024-09-07 NOTE — Anesthesia Procedure Notes (Signed)
 Spinal  Patient location during procedure: OR End time: 09/07/2024 9:30 AM Reason for block: surgical anesthesia Staffing Performed: anesthesiologist  Anesthesiologist: Jefm Garnette LABOR, MD Performed by: Jefm Garnette LABOR, MD Authorized by: Jefm Garnette LABOR, MD   Preanesthetic Checklist Completed: patient identified, IV checked, risks and benefits discussed, surgical consent, monitors and equipment checked, pre-op evaluation and timeout performed Spinal Block Patient position: sitting Prep: DuraPrep and site prepped and draped Patient monitoring: heart rate, cardiac monitor, continuous pulse ox and blood pressure Approach: midline Location: L2-3 Injection technique: single-shot Needle Needle type: Pencan  Needle gauge: 24 G Needle length: 10 cm Needle insertion depth: 7 cm Assessment Sensory level: T4 Events: CSF return Additional Notes  2 Attempt (s). Pt tolerated procedure well.

## 2024-09-07 NOTE — Progress Notes (Signed)
 Orthopedic Tech Progress Note Patient Details:  Amanda Wilkins September 23, 1946 [id number]  Patient ID: Amanda Wilkins, female   DOB: 23-Jan-1946, 78 y.o.   MRN: [id number] Removed CPM from pt.  Morna Pink 09/07/2024, 3:42 PM

## 2024-09-07 NOTE — Anesthesia Procedure Notes (Signed)
 Anesthesia Regional Block: Adductor canal block   Pre-Anesthetic Checklist: , timeout performed,  Correct Patient, Correct Site, Correct Laterality,  Correct Procedure, Correct Position, site marked,  Risks and benefits discussed,  Surgical consent,  Pre-op evaluation,  At surgeon's request and post-op pain management  Laterality: Lower and Left  Prep: chloraprep       Needles:  Injection technique: Single-shot  Needle Type: Echogenic Needle     Needle Length: 9cm  Needle Gauge: 22     Additional Needles:   Procedures:,,,, ultrasound used (permanent image in chart),,    Narrative:  Start time: 09/07/2024 8:40 AM End time: 09/07/2024 8:44 AM Injection made incrementally with aspirations every 5 mL.  Performed by: Personally  Anesthesiologist: Jefm Garnette LABOR, MD  Additional Notes: Block assessed prior to surgery. Pt tolerated procedure well.

## 2024-09-07 NOTE — Anesthesia Postprocedure Evaluation (Signed)
 Anesthesia Post Note  Patient: Amanda Wilkins  Procedure(s) Performed: ARTHROPLASTY, KNEE, TOTAL (Left: Knee)     Patient location during evaluation: Nursing Unit Anesthesia Type: Regional Level of consciousness: oriented and awake and alert Pain management: pain level controlled Vital Signs Assessment: post-procedure vital signs reviewed and stable Respiratory status: spontaneous breathing and respiratory function stable Cardiovascular status: blood pressure returned to baseline and stable Postop Assessment: no headache, no backache, no apparent nausea or vomiting and patient able to bend at knees Anesthetic complications: no   No notable events documented.  Last Vitals:  Vitals:   09/07/24 1145 09/07/24 1200  BP: 135/68 (!) 147/88  Pulse: 60 61  Resp: 15 13  Temp:    SpO2: 100% 97%    Last Pain:  Vitals:   09/07/24 1200  TempSrc:   PainSc: 0-No pain                 Garnette DELENA Gab

## 2024-09-07 NOTE — Care Plan (Signed)
 Ortho Bundle Case Management Note  Patient Details  Name: Amanda Wilkins MRN: [id number] Date of Birth: 1946-05-28  LT TKA on 09/07/24  DCP: Home with husband ? DME: No needs, has RW ? PT: EO                   DME Arranged:  N/A DME Agency:  NA  HH Arranged:    HH Agency:     Additional Comments: Please contact me with any questions of if this plan should need to change.  Burnard Dross, Case Manager [facility] [phone]  Ext. [phone]   09/07/2024, 8:07 AM

## 2024-09-08 ENCOUNTER — Encounter (HOSPITAL_COMMUNITY): Payer: Self-pay | Admitting: Orthopedic Surgery

## 2024-09-08 ENCOUNTER — Other Ambulatory Visit (HOSPITAL_COMMUNITY): Payer: Self-pay

## 2024-09-08 DIAGNOSIS — I2510 Atherosclerotic heart disease of native coronary artery without angina pectoris: Secondary | ICD-10-CM | POA: Diagnosis not present

## 2024-09-08 DIAGNOSIS — M1712 Unilateral primary osteoarthritis, left knee: Secondary | ICD-10-CM | POA: Diagnosis not present

## 2024-09-08 DIAGNOSIS — I5032 Chronic diastolic (congestive) heart failure: Secondary | ICD-10-CM | POA: Diagnosis not present

## 2024-09-08 DIAGNOSIS — Z96651 Presence of right artificial knee joint: Secondary | ICD-10-CM | POA: Diagnosis not present

## 2024-09-08 DIAGNOSIS — I110 Hypertensive heart disease with heart failure: Secondary | ICD-10-CM | POA: Diagnosis not present

## 2024-09-08 DIAGNOSIS — Z96641 Presence of right artificial hip joint: Secondary | ICD-10-CM | POA: Diagnosis not present

## 2024-09-08 LAB — BASIC METABOLIC PANEL WITH GFR
Anion gap: 8 (ref 5–15)
BUN: 14 mg/dL (ref 8–23)
CO2: 23 mmol/L (ref 22–32)
Calcium: 8.9 mg/dL (ref 8.9–10.3)
Chloride: 101 mmol/L (ref 98–111)
Creatinine, Ser: 0.87 mg/dL (ref 0.44–1.00)
GFR, Estimated: >60 mL/min (ref >60)
Glucose, Bld: 145 mg/dL — ABNORMAL HIGH (ref 70–99)
Potassium: 4.7 mmol/L (ref 3.5–5.1)
Sodium: 132 mmol/L — ABNORMAL LOW (ref 135–145)

## 2024-09-08 LAB — CBC
HCT: 27.7 % — ABNORMAL LOW (ref 36.0–46.0)
Hemoglobin: 9.4 g/dL — ABNORMAL LOW (ref 12.0–15.0)
MCH: 33.1 pg (ref 26.0–34.0)
MCHC: 33.9 g/dL (ref 30.0–36.0)
MCV: 97.5 fL (ref 80.0–100.0)
Platelets: 164 K/uL (ref 150–400)
RBC: 2.84 MIL/uL — ABNORMAL LOW (ref 3.87–5.11)
RDW: 13.2 % (ref 11.5–15.5)
WBC: 9.5 K/uL (ref 4.0–10.5)
nRBC: 0 % (ref 0.0–0.2)

## 2024-09-08 LAB — GLUCOSE, CAPILLARY: Glucose-Capillary: 135 mg/dL — ABNORMAL HIGH (ref 70–99)

## 2024-09-08 MED ORDER — ONDANSETRON HCL 4 MG PO TABS
4.0000 mg | ORAL_TABLET | Freq: Four times a day (QID) | ORAL | 0 refills | Status: AC | PRN
  Filled 2024-09-08: qty 20, 5d supply, fill #0

## 2024-09-08 MED ORDER — SODIUM CHLORIDE 0.9 % IV BOLUS: 250.0000 mL | Freq: Once | INTRAVENOUS | Status: AC

## 2024-09-08 MED ORDER — HYDROCODONE-ACETAMINOPHEN 5-325 MG PO TABS
1.0000 | ORAL_TABLET | ORAL | 0 refills | Status: DC | PRN
  Filled 2024-09-08: qty 42, 7d supply, fill #0

## 2024-09-08 NOTE — Care Management Obs Status (Signed)
 MEDICARE OBSERVATION STATUS NOTIFICATION   Patient Details  Name: Amanda Wilkins MRN: [id number] Date of Birth: October 21, 1945   Medicare Observation Status Notification Given:  Yes    NORMAN ASPEN, LCSW 09/08/2024, 11:08 AM

## 2024-09-08 NOTE — Progress Notes (Signed)
 Subjective: 1 Day Post-Op Procedure(s) (LRB): ARTHROPLASTY, KNEE, TOTAL (Left) Patient reports pain as mild.   Patient seen in rounds by Dr. Melodi. Patient is well, and has had no acute complaints or problems No issues overnight. Denies chest pain, SOB, or calf pain. Foley catheter removed this AM.  We will continue therapy today, ambulated 60' yesterday.   Objective: Vital signs in last 24 hours: Temp:  [97.7 F (36.5 C)-98.1 F (36.7 C)] 97.8 F (36.6 C) (11/25 0604) Pulse Rate:  [58-70] 66 (11/25 0819) Resp:  [11-18] 15 (11/25 0604) BP: (126-167)/(56-92) 127/62 (11/25 0819) SpO2:  [97 %-100 %] 100 % (11/25 0819) FiO2 (%):  [0 %-21 %] 21 % (11/24 2324)  Intake/Output from previous day:  Intake/Output Summary (Last 24 hours) at 09/08/2024 0828 Last data filed at 09/08/2024 0609 Gross per 24 hour  Intake 3766.1 ml  Output 3405 ml  Net 361.1 ml     Intake/Output this shift: No intake/output data recorded.  Labs: Recent Labs    09/08/24 0327  HGB 9.4*   Recent Labs    09/08/24 0327  WBC 9.5  RBC 2.84*  HCT 27.7*  PLT 164   Recent Labs    09/08/24 0327  NA 132*  K 4.7  CL 101  CO2 23  BUN 14  CREATININE 0.87  GLUCOSE 145*  CALCIUM  8.9   No results for input(s): LABPT, INR in the last 72 hours.  Exam: General - Patient is Alert and Oriented Extremity - Neurologically intact Neurovascular intact Sensation intact distally Dorsiflexion/Plantar flexion intact Dressing - dressing C/D/I Motor Function - intact, moving foot and toes well on exam.   Past Medical History:  Diagnosis Date   Acute bronchitis 09/11/2016   11/17 refractory   Acute cystitis without hematuria 05/17/2015   Acute kidney injury    Labs today   Acute right ankle pain 09/09/2018   Anticoagulant long-term use    Xarelto    Axillary pain    Bronchitis    Cellulitis    Cholecystitis    Chronic interstitial nephritis    CONJUNCTIVITIS, ACUTE 10/18/2010   Qualifier:  Diagnosis of  By: Plotnikov MD, Karlynn [name]    Coronary artery disease    Mild   D-dimer, elevated    Depression    Dysrhythmia    A. Fib/A. Flutter, AVT   Eye inflammation    GERD (gastroesophageal reflux disease)    History of interstitial nephritis 2016   chronic   History of kidney stones    has a small one found on xray   History of nuclear stress test 12/16/2014   Intermediate risk nuclear study w/ medium size moderate severity reversible defect in the basal and mid inferolateral and inferior wall (per dr cardiology note , dr vina gull did not think this was consistent with ischemia)/  normal LV function and wall motion, ef 75%   History of septic shock 01/22/2015   in setting Group A Strep Cellulits erysipelas/chest wall induration with streptoccocus basterium-- Severe sepsis, DIC, Acute respiratory failure with pulmonary edema, Acute Kidney failure with chronic interstitial nephritis   Hypertension    Hyponatremia    Migraines    on Zoloft  for migraines   Mild carotid artery disease    per duplex 08-30-2017 bilateral ICA 1-39%   OA (osteoarthritis) rheumotologist-  dr jon jacob   both knees,  shoulders, ankles   OSA on CPAP     moderate obstructive sleep apnea with an AHI of 23.4/h and  oxygen  desaturations as low as 84%.  Now on CPAP at 12 cm H2O.   PAF (paroxysmal atrial fibrillation) [facility]) 2009   cardiologist-- dr vina gull   Paroxysmal atrial flutter [facility])    a. dx 11/2017.   Pre-diabetes    PSVT (paroxysmal supraventricular tachycardia)    RA (rheumatoid arthritis) (HCC)    Wears contact lenses     Assessment/Plan: 1 Day Post-Op Procedure(s) (LRB): ARTHROPLASTY, KNEE, TOTAL (Left) Principal Problem:   OA (osteoarthritis) of knee Active Problems:   Primary osteoarthritis of left knee  Estimated body mass index is 38.61 kg/m as calculated from the following:   Height as of this encounter: 5' 5 (1.651 m).   Weight as of this encounter: 105.2 kg. Advance  diet Up with therapy D/C IV fluids   Patient's anticipated LOS is less than 2 midnights, meeting these requirements: - Lives within 1 hour of care - Has a competent adult at home to recover with post-op recover - NO history of  - Chronic pain requiring opioids  - Diabetes  - Coronary Artery Disease  - Heart failure  - Heart attack  - Stroke  - DVT/VTE  - Respiratory Failure/COPD  - Renal failure  - Anemia  - Advanced Liver disease   DVT Prophylaxis - Eliquis  Weight bearing as tolerated. Continue therapy.  Sodium 132 this AM, improved from her baseline  Plan is to go Home after hospital stay. Plan for discharge later today if progresses with therapy and meeting goals. Scheduled for OPPT at [facility]. Follow-up in the office in 2 weeks.  The PDMP database was reviewed today prior to any opioid medications being prescribed to this patient.  Amanda Mess, PA-C Orthopedic Surgery [phone] 09/08/2024, 8:28 AM

## 2024-09-08 NOTE — Progress Notes (Signed)
   09/08/24 2020  BiPAP/CPAP/SIPAP  BiPAP/CPAP/SIPAP Pt Type Adult (Patient prefers self placement when ready for bed)  BiPAP/CPAP/SIPAP DREAMSTATIOND  Mask Type Nasal mask  Dentures removed? Not applicable  EPAP 6 cmH2O  FiO2 (%) 21 %  Patient Home Machine No  Patient Home Mask Yes  Patient Home Tubing Yes  Auto Titrate No  CPAP/SIPAP surface wiped down Yes  Device Plugged into RED Power Outlet Yes

## 2024-09-08 NOTE — Progress Notes (Addendum)
 PT TX NOTE  09/08/24 1400  PT Visit Information  Last PT Received On 09/08/24  Assistance Needed Pt amb 80' with RW, no dizziness but c/o fullness, headache pain upon standing and ambulating.  BPs have been stable this pm. Ambulation followed by TKA exercises,. Ice to knee EOS, pain controlled.   History of Present Illness 78 yo female s/p L TKA 09/07/24. PMH: vertigo, afib, TKA 2019, R hip bursitis, OA, shingles, HTN  Precautions  Precautions Fall;Knee  Recall of Precautions/Restrictions Intact  Restrictions  LLE Weight Bearing Per Provider Order WBAT  Pain Assessment  Pain Assessment 0-10  Pain Score 5  Pain Location L knee  Pain Descriptors / Indicators Aching;Sore  Pain Intervention(s) Limited activity within patient's tolerance;Monitored during session  Cognition  Arousal Alert  Behavior During Therapy WFL for tasks assessed/performed  PT - Cognitive impairments No apparent impairments  Following Commands  Following commands Intact  Cueing  Cueing Techniques Verbal cues  Communication  Communication No apparent difficulties  Bed Mobility  Overal bed mobility Needs Assistance  Bed Mobility Sit to Supine;Supine to Sit  Supine to sit Contact guard  Sit to supine Min assist  General bed mobility comments assist to lift bil LEs on to bed  Transfers  Overall transfer level Needs assistance  Equipment used Rolling walker (2 wheels)  Transfers Sit to/from Stand;Bed to chair/wheelchair/BSC  Sit to Stand Contact guard assist  Step pivot transfers Contact guard assist  General transfer comment cues for hand placement and LE position  Ambulation/Gait  Ambulation/Gait assistance Contact guard assist  Gait Distance (Feet) 80 Feet  Assistive device Rolling walker (2 wheels)  Gait Pattern/deviations Step-to pattern  General Gait Details cues for  sequence and RW position, chair follow for safety  Gait velocity decr  Total Joint Exercises  Ankle Circles/Pumps Both;10 reps;AROM   Quad Sets AROM;Both;10 reps  Heel Slides AAROM;Left;10 reps  Straight Leg Raises AAROM;Strengthening;AROM;Left;10 reps  PT - End of Session  Equipment Utilized During Treatment Gait belt  Activity Tolerance Patient tolerated treatment well  Patient left in bed;with call bell/phone within reach;with bed alarm set;with family/visitor present  Nurse Communication Mobility status   PT - Assessment/Plan  PT Visit Diagnosis Other abnormalities of gait and mobility (R26.89)  PT Frequency (ACUTE ONLY) 7X/week  Follow Up Recommendations Follow physician's recommendations for discharge plan and follow up therapies  Patient can return home with the following A little help with walking and/or transfers;A little help with bathing/dressing/bathroom;Assist for transportation;Help with stairs or ramp for entrance;Assistance with cooking/housework  PT equipment None recommended by PT  AM-PAC PT 6 Clicks Mobility Outcome Measure (Version 2)  Help needed turning from your back to your side while in a flat bed without using bedrails? 3  Help needed moving from lying on your back to sitting on the side of a flat bed without using bedrails? 3  Help needed moving to and from a bed to a chair (including a wheelchair)? 3  Help needed standing up from a chair using your arms (e.g., wheelchair or bedside chair)? 3  Help needed to walk in hospital room? 3  Help needed climbing 3-5 steps with a railing?  3  6 Click Score 18  Consider Recommendation of Discharge To: Home with [facility]  PT Goal Progression  Progress towards PT goals Progressing toward goals  Acute Rehab PT Goals  PT Goal Formulation With patient  Time For Goal Achievement 09/20/24  Potential to Achieve Goals Good  PT Time Calculation  PT Start Time (ACUTE ONLY) 1413  PT Stop Time (ACUTE ONLY) 1434  PT Time Calculation (min) (ACUTE ONLY) 21 min  PT General Charges  $$ ACUTE PT VISIT 1 Visit  PT Treatments  $Gait Training 8-22 mins

## 2024-09-08 NOTE — Plan of Care (Signed)
  Problem: Education: Goal: Knowledge of General Education information will improve Description: Including pain rating scale, medication(s)/side effects and non-pharmacologic comfort measures Outcome: Progressing   Problem: Health Behavior/Discharge Planning: Goal: Ability to manage health-related needs will improve Outcome: Progressing   Problem: Clinical Measurements: Goal: Ability to maintain clinical measurements within normal limits will improve Outcome: Progressing Goal: Will remain free from infection Outcome: Progressing Goal: Diagnostic test results will improve Outcome: Progressing Goal: Respiratory complications will improve Outcome: Progressing Goal: Cardiovascular complication will be avoided Outcome: Progressing   Problem: Activity: Goal: Risk for activity intolerance will decrease Outcome: Adequate for Discharge   Problem: Nutrition: Goal: Adequate nutrition will be maintained Outcome: Completed/Met   Problem: Coping: Goal: Level of anxiety will decrease Outcome: Progressing   Problem: Elimination: Goal: Will not experience complications related to bowel motility Outcome: Progressing Goal: Will not experience complications related to urinary retention Outcome: Progressing   Problem: Pain Managment: Goal: General experience of comfort will improve and/or be controlled Outcome: Progressing   Problem: Safety: Goal: Ability to remain free from injury will improve Outcome: Progressing   Problem: Skin Integrity: Goal: Risk for impaired skin integrity will decrease Outcome: Progressing   Problem: Education: Goal: Knowledge of the prescribed therapeutic regimen will improve Outcome: Progressing Goal: Individualized Educational Video(s) Outcome: Completed/Met   Problem: Activity: Goal: Ability to avoid complications of mobility impairment will improve Outcome: Adequate for Discharge Goal: Range of joint motion will improve Outcome: Adequate for  Discharge   Problem: Clinical Measurements: Goal: Postoperative complications will be avoided or minimized Outcome: Progressing   Problem: Pain Management: Goal: Pain level will decrease with appropriate interventions Outcome: Progressing   Problem: Skin Integrity: Goal: Will show signs of wound healing Outcome: Progressing

## 2024-09-08 NOTE — TOC Transition Note (Signed)
 Transition of Care [facility]) - Discharge Note   Patient Details  Name: Amanda Wilkins MRN: [id number] Date of Birth: 08/22/1946  [facility]) CM/SW Contact:  NORMAN ASPEN, LCSW Phone Number: 09/08/2024, 11:07 AM   Clinical Narrative:     Met with pt who confirms she has needed DME in the home.  OPPT already arranged with [facility].  No further IP CM needs.  Final next level of care: OP Rehab Barriers to Discharge: No Barriers Identified   Patient Goals and CMS Choice Patient states their goals for this hospitalization and ongoing recovery are:: return home          Discharge Placement                       Discharge Plan and Services Additional resources added to the After Visit Summary for                  DME Arranged: N/A DME Agency: NA                  Social Drivers of Health (SDOH) Interventions SDOH Screenings   Food Insecurity: No Food Insecurity (09/07/2024)  Housing: Low Risk  (09/07/2024)  Transportation Needs: No Transportation Needs (09/07/2024)  Utilities: Not At Risk (09/07/2024)  Alcohol Screen: Low Risk  (04/23/2024)  Depression (PHQ2-9): Low Risk  (06/29/2024)  Financial Resource Strain: Low Risk  (04/23/2024)  Physical Activity: Insufficiently Active (04/23/2024)  Social Connections: Moderately Isolated (09/07/2024)  Stress: No Stress Concern Present (04/23/2024)  Tobacco Use: Low Risk  (09/07/2024)  Health Literacy: Adequate Health Literacy (03/26/2024)     Readmission Risk Interventions    06/26/2024    8:55 AM  Readmission Risk Prevention Plan  Transportation Screening Complete  PCP or Specialist Appt within 5-7 Days Complete  Home Care Screening Complete  Medication Review (RN CM) Complete

## 2024-09-08 NOTE — Progress Notes (Signed)
 Physical Therapy Treatment Patient Details Name: Amanda Wilkins MRN: [id number] DOB: [birth year] Today's Date: 09/08/2024   History of Present Illness 78 yo female s/p L TKA 09/07/24. PMH: vertigo, afib, TKA 2019, R hip bursitis, OA, shingles, HTN    PT Comments  Pt had LOC in bathroom earlier with nursing staff. BP 120/58 prior to PT session. Pt amb 50' with min assist/CGA and chair follow for safety. No dizziness during PT session. Continue to follow     If plan is discharge home, recommend the following: A little help with walking and/or transfers;A little help with bathing/dressing/bathroom;Assist for transportation;Help with stairs or ramp for entrance;Assistance with cooking/housework   Can travel by private vehicle        Equipment Recommendations  None recommended by PT    Recommendations for Other Services       Precautions / Restrictions Precautions Precautions: Fall;Knee Recall of Precautions/Restrictions: Intact Restrictions LLE Weight Bearing Per Provider Order: Weight bearing as tolerated     Mobility  Bed Mobility Overal bed mobility: Needs Assistance Bed Mobility: Sit to Supine       Sit to supine: Min assist   General bed mobility comments: assist to lift bil LEs on to bed    Transfers Overall transfer level: Needs assistance Equipment used: Rolling walker (2 wheels) Transfers: Sit to/from Stand, Bed to chair/wheelchair/BSC Sit to Stand: Min assist   Step pivot transfers: Min assist, Contact guard assist       General transfer comment: cues for hand placement and LE position    Ambulation/Gait Ambulation/Gait assistance: Contact guard assist Gait Distance (Feet): 50 Feet Assistive device: Rolling walker (2 wheels) Gait Pattern/deviations: Step-to pattern Gait velocity: decr     General Gait Details: cues for  sequence and RW position, chair follow for safety   Stairs             Wheelchair Mobility     Tilt Bed     Modified Rankin (Stroke Patients Only)       Balance                                            Communication Communication Communication: No apparent difficulties  Cognition Arousal: Alert Behavior During Therapy: WFL for tasks assessed/performed   PT - Cognitive impairments: No apparent impairments                         Following commands: Intact      Cueing Cueing Techniques: Verbal cues  Exercises Total Joint Exercises Ankle Circles/Pumps: AAROM, Both, 10 reps Quad Sets: AROM, Both, 5 reps    General Comments        Pertinent Vitals/Pain Pain Assessment Pain Assessment: 0-10 Pain Score: 7  Pain Location: L knee Pain Descriptors / Indicators: Aching, Sore Pain Intervention(s): Limited activity within patient's tolerance, Monitored during session, Premedicated before session, Repositioned    Home Living                          Prior Function            PT Goals (current goals can now be found in the Wilkins plan section) Acute Rehab PT Goals PT Goal Formulation: With patient Time For Goal Achievement: 09/20/24 Potential to Achieve Goals: Good Progress towards PT goals: Progressing toward goals  Frequency    7X/week      PT Plan      Co-evaluation              AM-PAC PT 6 Clicks Mobility   Outcome Measure  Help needed turning from your back to your side while in a flat bed without using bedrails?: A Little Help needed moving from lying on your back to sitting on the side of a flat bed without using bedrails?: A Little Help needed moving to and from a bed to a chair (including a wheelchair)?: A Little Help needed standing up from a chair using your arms (e.g., wheelchair or bedside chair)?: A Little Help needed to walk in hospital room?: A Little Help needed climbing 3-5 steps with a railing? : A Little 6 Click Score: 18    End of Session Equipment Utilized During Treatment: Gait  belt Activity Tolerance: Patient tolerated treatment well Patient left: in bed;with call bell/phone within reach;with bed alarm set;with family/visitor present Nurse Communication: Mobility status PT Visit Diagnosis: Other abnormalities of gait and mobility (R26.89)     Time: 8850-8791 PT Time Calculation (min) (ACUTE ONLY): 19 min  Charges:    $Gait Training: 8-22 mins PT General Charges $$ ACUTE PT VISIT: 1 Visit                     Lily Velasquez, PT  Acute Rehab Dept [facility]) [phone]  09/08/2024    [facility] 09/08/2024, 12:38 PM

## 2024-09-09 DIAGNOSIS — Z96651 Presence of right artificial knee joint: Secondary | ICD-10-CM | POA: Diagnosis not present

## 2024-09-09 DIAGNOSIS — M1712 Unilateral primary osteoarthritis, left knee: Secondary | ICD-10-CM | POA: Diagnosis not present

## 2024-09-09 DIAGNOSIS — Z96641 Presence of right artificial hip joint: Secondary | ICD-10-CM | POA: Diagnosis not present

## 2024-09-09 DIAGNOSIS — I5032 Chronic diastolic (congestive) heart failure: Secondary | ICD-10-CM | POA: Diagnosis not present

## 2024-09-09 DIAGNOSIS — I110 Hypertensive heart disease with heart failure: Secondary | ICD-10-CM | POA: Diagnosis not present

## 2024-09-09 DIAGNOSIS — I2510 Atherosclerotic heart disease of native coronary artery without angina pectoris: Secondary | ICD-10-CM | POA: Diagnosis not present

## 2024-09-09 LAB — CBC
HCT: 25.5 % — ABNORMAL LOW (ref 36.0–46.0)
Hemoglobin: 8.6 g/dL — ABNORMAL LOW (ref 12.0–15.0)
MCH: 33.3 pg (ref 26.0–34.0)
MCHC: 33.7 g/dL (ref 30.0–36.0)
MCV: 98.8 fL (ref 80.0–100.0)
Platelets: 173 K/uL (ref 150–400)
RBC: 2.58 MIL/uL — ABNORMAL LOW (ref 3.87–5.11)
RDW: 13.5 % (ref 11.5–15.5)
WBC: 8.9 K/uL (ref 4.0–10.5)
nRBC: 0 % (ref 0.0–0.2)

## 2024-09-09 NOTE — Progress Notes (Signed)
 Physical Therapy Treatment Patient Details Name: Amanda Wilkins MRN: [id number] DOB: 1946/01/12 Today's Date: 09/09/2024   History of Present Illness 78 yo female s/p L TKA 09/07/24. PMH: vertigo, afib, TKA 2019, R hip bursitis, OA, shingles, HTN    PT Comments  Pt meeting goals, ready to d/c home from PT standpoint with spouse assisting prn.     If plan is discharge home, recommend the following: A little help with walking and/or transfers;A little help with bathing/dressing/bathroom;Assist for transportation;Help with stairs or ramp for entrance;Assistance with cooking/housework   Can travel by private vehicle        Equipment Recommendations  None recommended by PT    Recommendations for Other Services       Precautions / Restrictions Precautions Precautions: Fall;Knee Recall of Precautions/Restrictions: Intact Restrictions LLE Weight Bearing Per Provider Order: Weight bearing as tolerated     Mobility  Bed Mobility Overal bed mobility: Needs Assistance Bed Mobility: Sit to Supine, Supine to Sit     Supine to sit: Supervision Sit to supine: Supervision   General bed mobility comments: pt able to self assist with leg lifter    Transfers Overall transfer level: Needs assistance Equipment used: Rolling walker (2 wheels) Transfers: Sit to/from Stand Sit to Stand: Supervision, Contact guard assist           General transfer comment: cues for hand placement and LE position    Ambulation/Gait Ambulation/Gait assistance: Supervision, Contact guard assist Gait Distance (Feet): 80 Feet Assistive device: Rolling walker (2 wheels) Gait Pattern/deviations: Step-to pattern, Decreased step length - right, Decreased step length - left, Decreased stance time - left Gait velocity: decr     General Gait Details: ongoing education for sequence, L knee flexion during swing phase, emerging step through pattern end of distance without incr pain   Stairs Stairs:  Yes Stairs assistance: Contact guard assist Stair Management: One rail Right, Step to pattern, With cane, Forwards Number of Stairs: 2 General stair comments: up 2/down 3 steps. cues for sequence and technique. good stability, noLOB   Wheelchair Mobility     Tilt Bed    Modified Rankin (Stroke Patients Only)       Balance                                            Communication Communication Communication: No apparent difficulties  Cognition Arousal: Alert Behavior During Therapy: WFL for tasks assessed/performed   PT - Cognitive impairments: No apparent impairments                         Following commands: Intact      Cueing Cueing Techniques: Verbal cues  Exercises Total Joint Exercises Ankle Circles/Pumps: Both, 10 reps, AROM Quad Sets: AROM, Both, 10 reps Heel Slides: AAROM, Left, 10 reps Straight Leg Raises: AAROM, Strengthening, AROM, Left, 5 reps, Limitations Straight Leg Raises Limitations: pain    General Comments        Pertinent Vitals/Pain Pain Assessment Pain Assessment: 0-10 Pain Score: 6  Pain Location: L knee Pain Descriptors / Indicators: Aching, Sore Pain Intervention(s): Limited activity within patient's tolerance, Monitored during session, Premedicated before session, Repositioned (ice left iwth pt, pt declined at this time)    Home Living  Prior Function            PT Goals (current goals can now be found in the care plan section) Acute Rehab PT Goals Patient Stated Goal: to walk without pain PT Goal Formulation: With patient Time For Goal Achievement: 09/20/24 Potential to Achieve Goals: Good Progress towards PT goals: Progressing toward goals    Frequency    7X/week      PT Plan      Co-evaluation              AM-PAC PT 6 Clicks Mobility   Outcome Measure  Help needed turning from your back to your side while in a flat bed without using  bedrails?: A Little Help needed moving from lying on your back to sitting on the side of a flat bed without using bedrails?: A Little Help needed moving to and from a bed to a chair (including a wheelchair)?: A Little Help needed standing up from a chair using your arms (e.g., wheelchair or bedside chair)?: A Little Help needed to walk in hospital room?: A Little Help needed climbing 3-5 steps with a railing? : A Little 6 Click Score: 18    End of Session Equipment Utilized During Treatment: Gait belt Activity Tolerance: Patient tolerated treatment well Patient left: in bed;with call bell/phone within reach;with bed alarm set;with family/visitor present Nurse Communication: Mobility status PT Visit Diagnosis: Other abnormalities of gait and mobility (R26.89)     Time: 8950-8885 PT Time Calculation (min) (ACUTE ONLY): 25 min  Charges:    $Gait Training: 8-22 mins $Therapeutic Exercise: 8-22 mins PT General Charges $$ ACUTE PT VISIT: 1 Visit                     Rexene, PT  Acute Rehab Dept [facility]) [phone]  09/09/2024    [facility] 09/09/2024, 11:20 AM

## 2024-09-09 NOTE — Progress Notes (Signed)
 Subjective: 2 Days Post-Op Procedure(s) (LRB): ARTHROPLASTY, KNEE, TOTAL (Left) Patient reports pain as mild.   Patient seen in rounds by Dr. Melodi. Patient had vasovagal episode yesterday when first attempting to get up. Completed two physical therapy sessions after that without issue.  Plan is to go Home after hospital stay.  Objective: Vital signs in last 24 hours: Temp:  [98 F (36.7 C)-98.8 F (37.1 C)] 98.4 F (36.9 C) (11/26 0547) Pulse Rate:  [66-77] 73 (11/26 0749) Resp:  [16-18] 18 (11/26 0547) BP: (119-144)/(47-64) 144/59 (11/26 0749) SpO2:  [97 %-100 %] 99 % (11/26 0749) FiO2 (%):  [21 %] 21 % (11/25 2020)  Intake/Output from previous day:  Intake/Output Summary (Last 24 hours) at 09/09/2024 0752 Last data filed at 09/09/2024 0600 Gross per 24 hour  Intake 1262.3 ml  Output 700 ml  Net 562.3 ml    Intake/Output this shift: No intake/output data recorded.  Labs: Recent Labs    09/08/24 0327 09/09/24 0332  HGB 9.4* 8.6*   Recent Labs    09/08/24 0327 09/09/24 0332  WBC 9.5 8.9  RBC 2.84* 2.58*  HCT 27.7* 25.5*  PLT 164 173   Recent Labs    09/08/24 0327  NA 132*  K 4.7  CL 101  CO2 23  BUN 14  CREATININE 0.87  GLUCOSE 145*  CALCIUM  8.9   No results for input(s): LABPT, INR in the last 72 hours.  Exam: General - Patient is Alert and Oriented Extremity - Neurologically intact Neurovascular intact Sensation intact distally Dorsiflexion/Plantar flexion intact Dressing/Incision - clean, dry, intact Motor Function - intact, moving foot and toes well on exam.   Past Medical History:  Diagnosis Date   Acute bronchitis 09/11/2016   11/17 refractory   Acute cystitis without hematuria 05/17/2015   Acute kidney injury    Labs today   Acute right ankle pain 09/09/2018   Anticoagulant long-term use    Xarelto    Axillary pain    Bronchitis    Cellulitis    Cholecystitis    Chronic interstitial nephritis    CONJUNCTIVITIS,  ACUTE 10/18/2010   Qualifier: Diagnosis of  By: Plotnikov MD, Karlynn [name]    Coronary artery disease    Mild   D-dimer, elevated    Depression    Dysrhythmia    A. Fib/A. Flutter, AVT   Eye inflammation    GERD (gastroesophageal reflux disease)    History of interstitial nephritis 2016   chronic   History of kidney stones    has a small one found on xray   History of nuclear stress test 12/16/2014   Intermediate risk nuclear study w/ medium size moderate severity reversible defect in the basal and mid inferolateral and inferior wall (per dr cardiology note , dr vina gull did not think this was consistent with ischemia)/  normal LV function and wall motion, ef 75%   History of septic shock 01/22/2015   in setting Group A Strep Cellulits erysipelas/chest wall induration with streptoccocus basterium-- Severe sepsis, DIC, Acute respiratory failure with pulmonary edema, Acute Kidney failure with chronic interstitial nephritis   Hypertension    Hyponatremia    Migraines    on Zoloft  for migraines   Mild carotid artery disease    per duplex 08-30-2017 bilateral ICA 1-39%   OA (osteoarthritis) rheumotologist-  dr jon jacob   both knees,  shoulders, ankles   OSA on CPAP     moderate obstructive sleep apnea with an AHI of 23.4/h  and oxygen  desaturations as low as 84%.  Now on CPAP at 12 cm H2O.   PAF (paroxysmal atrial fibrillation) [facility]) 2009   cardiologist-- dr vina gull   Paroxysmal atrial flutter [facility])    a. dx 11/2017.   Pre-diabetes    PSVT (paroxysmal supraventricular tachycardia)    RA (rheumatoid arthritis) (HCC)    Wears contact lenses     Assessment/Plan: 2 Days Post-Op Procedure(s) (LRB): ARTHROPLASTY, KNEE, TOTAL (Left) Principal Problem:   OA (osteoarthritis) of knee Active Problems:   Primary osteoarthritis of left knee  Estimated body mass index is 38.61 kg/m as calculated from the following:   Height as of this encounter: 5' 5 (1.651 m).   Weight as of this  encounter: 105.2 kg. Up with therapy  DVT Prophylaxis - Eliquis  Weight-bearing as tolerated  Discharge to home today once cleared with PT  Roxie Mess, PA-C Orthopedic Surgery [phone] 09/09/2024, 7:52 AM

## 2024-09-09 NOTE — Progress Notes (Signed)
 Discharge medications delivered to patient at the bedside.

## 2024-09-11 NOTE — Telephone Encounter (Signed)
 See separate encounter

## 2024-09-14 DIAGNOSIS — M25562 Pain in left knee: Secondary | ICD-10-CM | POA: Diagnosis not present

## 2024-09-14 NOTE — Discharge Summary (Signed)
 Patient ID: Amanda Wilkins MRN: [id number] DOB/AGE: Jan 25, 1946 78 y.o.  Admit date: 09/07/2024 Discharge date: 09/09/2024  Admission Diagnoses:  Principal Problem:   OA (osteoarthritis) of knee Active Problems:   Primary osteoarthritis of left knee   Discharge Diagnoses:  Same  Past Medical History:  Diagnosis Date   Acute bronchitis 09/11/2016   11/17 refractory   Acute cystitis without hematuria 05/17/2015   Acute kidney injury    Labs today   Acute right ankle pain 09/09/2018   Anticoagulant long-term use    Xarelto    Axillary pain    Bronchitis    Cellulitis    Cholecystitis    Chronic interstitial nephritis    CONJUNCTIVITIS, ACUTE 10/18/2010   Qualifier: Diagnosis of  By: Plotnikov MD, Karlynn [name]    Coronary artery disease    Mild   D-dimer, elevated    Depression    Dysrhythmia    A. Fib/A. Flutter, AVT   Eye inflammation    GERD (gastroesophageal reflux disease)    History of interstitial nephritis 2016   chronic   History of kidney stones    has a small one found on xray   History of nuclear stress test 12/16/2014   Intermediate risk nuclear study w/ medium size moderate severity reversible defect in the basal and mid inferolateral and inferior wall (per dr cardiology note , dr vina gull did not think this was consistent with ischemia)/  normal LV function and wall motion, ef 75%   History of septic shock 01/22/2015   in setting Group A Strep Cellulits erysipelas/chest wall induration with streptoccocus basterium-- Severe sepsis, DIC, Acute respiratory failure with pulmonary edema, Acute Kidney failure with chronic interstitial nephritis   Hypertension    Hyponatremia    Migraines    on Zoloft  for migraines   Mild carotid artery disease    per duplex 08-30-2017 bilateral ICA 1-39%   OA (osteoarthritis) rheumotologist-  dr jon jacob   both knees,  shoulders, ankles   OSA on CPAP     moderate obstructive sleep apnea with an AHI of 23.4/h and  oxygen  desaturations as low as 84%.  Now on CPAP at 12 cm H2O.   PAF (paroxysmal atrial fibrillation) [facility]) 2009   cardiologist-- dr vina gull   Paroxysmal atrial flutter [facility])    a. dx 11/2017.   Pre-diabetes    PSVT (paroxysmal supraventricular tachycardia)    RA (rheumatoid arthritis) (HCC)    Wears contact lenses     Surgeries: Procedure(s): ARTHROPLASTY, KNEE, TOTAL on 09/07/2024   Consultants:   Discharged Condition: Improved  Hospital Course: Amanda Wilkins is an 78 y.o. female who was admitted 09/07/2024 for operative treatment ofOA (osteoarthritis) of knee. Patient has severe unremitting pain that affects sleep, daily activities, and work/hobbies. After pre-op clearance the patient was taken to the operating room on 09/07/2024 and underwent  Procedure(s): ARTHROPLASTY, KNEE, TOTAL.    Patient was given perioperative antibiotics:  Anti-infectives (From admission, onward)    Start     Dose/Rate Route Frequency Ordered Stop   09/08/24 1000  hydroxychloroquine  (PLAQUENIL ) tablet 400 mg  Status:  Discontinued        400 mg Oral Daily 09/07/24 1236 09/09/24 1840   09/07/24 1630  ceFAZolin  (ANCEF ) IVPB 2g/100 mL premix        2 g 200 mL/hr over 30 Minutes Intravenous Every 6 hours 09/07/24 1236 09/08/24 0824   09/07/24 0715  ceFAZolin  (ANCEF ) IVPB 2g/100 mL premix  2 g 200 mL/hr over 30 Minutes Intravenous On call to O.R. 09/07/24 [id number] 09/07/24 0940        Patient was given sequential compression devices, early ambulation, and chemoprophylaxis to prevent DVT.  Patient benefited maximally from hospital stay and there were no complications.    Recent vital signs: No data found.   Recent laboratory studies: No results for input(s): WBC, HGB, HCT, PLT, NA, K, CL, CO2, BUN, CREATININE, GLUCOSE, INR, CALCIUM  in the last 72 hours.  Invalid input(s): PT, 2   Discharge Medications:   Allergies as of 09/09/2024       Reactions    Vibramycin  [doxycycline ] Hives   Aspirin  Other (See Comments)   Tachycardia (OK to take EC aspirin , however)   Benadryl  [diphenhydramine ] Other (See Comments)   Tachycardia    Covid-19 Ad26 Vaccine(janssen) Hives      Hylan G-f 20 Other (See Comments)   Very painful (knee injection)   Other Hives, Other (See Comments)   Unnamed preservative in artifical crabmeat   Tape Rash, Other (See Comments)   Blistering rashes        Medication List     STOP taking these medications    Zepbound  2.5 MG/0.5ML Pen Generic drug: tirzepatide        TAKE these medications    ALPRAZolam  0.25 MG tablet Commonly known as: XANAX  Take 1 tablet (0.25 mg total) by mouth 2 (two) times daily as needed for anxiety.   cholecalciferol  25 MCG (1000 UNIT) tablet Commonly known as: VITAMIN D3 Take 1,000 Units by mouth daily.   dronedarone  400 MG tablet Commonly known as: MULTAQ  Take 1 tablet (400 mg total) by mouth 2 (two) times daily with a meal.   Eliquis  5 MG Tabs tablet Generic drug: apixaban  TAKE 1 TABLET(5 MG) BY MOUTH TWICE DAILY What changed: See the new instructions.   famotidine  20 MG tablet Commonly known as: PEPCID  TAKE 1 TABLET(20 MG) BY MOUTH AT BEDTIME What changed: See the new instructions.   furosemide  40 MG tablet Commonly known as: LASIX  Take 1 tablet (40 mg total) by mouth daily as needed for fluid or edema.   HYDROcodone -acetaminophen  5-325 MG tablet Commonly known as: NORCO/VICODIN Take 1 tablet by mouth every 4 (four) hours as needed for severe pain (pain score 7-10). What changed: when to take this   hydroxychloroquine  200 MG tablet Commonly known as: PLAQUENIL  Take 400 mg by mouth in the morning.   levalbuterol  45 MCG/ACT inhaler Commonly known as: Xopenex  HFA Inhale 2 puffs into the lungs every 6 (six) hours as needed for wheezing.   metoprolol  tartrate 25 MG tablet Commonly known as: LOPRESSOR  Take 0.5 tablets (12.5 mg total) by mouth 2 (two) times  daily.   ondansetron  4 MG tablet Commonly known as: ZOFRAN  Take 1 tablet (4 mg total) by mouth every 6 (six) hours as needed for nausea.   potassium chloride  10 MEQ tablet Commonly known as: KLOR-CON  Take 2 tablets by mouth daily What changed:  how much to take how to take this when to take this additional instructions   rosuvastatin  5 MG tablet Commonly known as: CRESTOR  Take 1 tablet (5 mg total) by mouth daily.   sertraline  50 MG tablet Commonly known as: ZOLOFT  TAKE 1 TABLET BY MOUTH DAILY What changed: when to take this   triamcinolone  ointment 0.1 % Commonly known as: KENALOG  Apply 1 Application topically 2 (two) times daily as needed (for irritation- affected site).   TYLENOL  500 MG tablet Generic drug: acetaminophen   Take 1,000 mg by mouth every 6 (six) hours as needed for mild pain (pain score 1-3).               Discharge Care Instructions  (From admission, onward)           Start     Ordered   09/08/24 0000  Weight bearing as tolerated        09/08/24 0831   09/08/24 0000  Change dressing       Comments: You may remove the bulky bandage (ACE wrap and gauze) two days after surgery. You will have an adhesive waterproof bandage underneath. Leave this in place until your first follow-up appointment.   09/08/24 0831            Diagnostic Studies: No results found.  Disposition: Discharge disposition: 01-Home or Self Care       Discharge Instructions     Call MD / Call 911   Complete by: As directed    If you experience chest pain or shortness of breath, CALL 911 and be transported to the hospital emergency room.  If you develope a fever above 101 F, pus (white drainage) or increased drainage or redness at the wound, or calf pain, call your surgeon's office.   Change dressing   Complete by: As directed    You may remove the bulky bandage (ACE wrap and gauze) two days after surgery. You will have an adhesive waterproof bandage underneath.  Leave this in place until your first follow-up appointment.   Constipation Prevention   Complete by: As directed    Drink plenty of fluids.  Prune juice may be helpful.  You may use a stool softener, such as Colace (over the counter) 100 mg twice a day.  Use MiraLax  (over the counter) for constipation as needed.   Diet - low sodium heart healthy   Complete by: As directed    Do not put a pillow under the knee. Place it under the heel.   Complete by: As directed    Driving restrictions   Complete by: As directed    No driving for two weeks   Post-operative opioid taper instructions:   Complete by: As directed    POST-OPERATIVE OPIOID TAPER INSTRUCTIONS: It is important to wean off of your opioid medication as soon as possible. If you do not need pain medication after your surgery it is ok to stop day one. Opioids include: Codeine , Hydrocodone (Norco, Vicodin), Oxycodone (Percocet, oxycontin ) and hydromorphone  amongst others.  Long term and even short term use of opiods can cause: Increased pain response Dependence Constipation Depression Respiratory depression And more.  Withdrawal symptoms can include Flu like symptoms Nausea, vomiting And more Techniques to manage these symptoms Hydrate well Eat regular healthy meals Stay active Use relaxation techniques(deep breathing, meditating, yoga) Do Not substitute Alcohol to help with tapering If you have been on opioids for less than two weeks and do not have pain than it is ok to stop all together.  Plan to wean off of opioids This plan should start within one week post op of your joint replacement. Maintain the same interval or time between taking each dose and first decrease the dose.  Cut the total daily intake of opioids by one tablet each day Next start to increase the time between doses. The last dose that should be eliminated is the evening dose.      TED hose   Complete by: As directed    Use stockings (TED  hose) for  three weeks on both leg(s).  You may remove them at night for sleeping.   Weight bearing as tolerated   Complete by: As directed         Follow-up Information     Melodi Lerner, MD. Go on 09/23/2024.   Specialty: Orthopedic Surgery Why: You are scheduled for a post op appointment on Wednesday 09/23/24 at 3:30pm Contact information: [address] [place] [address] [phone]         [facility] Go on 09/14/2024.   Why: You are scheduled for physical therapy Monday 09/14/24 at 11:50am; [address] Contact information: [address] [place] [address] [phone]                  Signed: Roxie Mess 09/14/2024, 10:02 AM

## 2024-09-16 DIAGNOSIS — M25562 Pain in left knee: Secondary | ICD-10-CM | POA: Diagnosis not present

## 2024-09-17 ENCOUNTER — Telehealth: Payer: Self-pay

## 2024-09-22 NOTE — Telephone Encounter (Signed)
 I was able to send needed information via mychart.

## 2024-09-23 ENCOUNTER — Telehealth: Payer: Self-pay

## 2024-09-23 DIAGNOSIS — M25562 Pain in left knee: Secondary | ICD-10-CM | POA: Diagnosis not present

## 2024-09-23 NOTE — Telephone Encounter (Signed)
 Clinic completed provider portion of MULTAQ  PAP. Patient portion has been mailed to patient to complete and return.

## 2024-09-25 DIAGNOSIS — M25562 Pain in left knee: Secondary | ICD-10-CM | POA: Diagnosis not present

## 2024-10-06 ENCOUNTER — Telehealth: Payer: Self-pay | Admitting: Cardiology

## 2024-10-06 MED ORDER — DRONEDARONE HCL 400 MG PO TABS: 400.0000 mg | ORAL_TABLET | Freq: Two times a day (BID) | ORAL | 1 refills | Status: AC

## 2024-10-06 NOTE — Telephone Encounter (Signed)
" °*  STAT* If patient is at the pharmacy, call can be transferred to refill team.   1. Which medications need to be refilled? (please list name of each medication and dose if known)   dronedarone  (MULTAQ ) 400 MG tablet    4. Which pharmacy/location (including street and city if local pharmacy) is medication to be sent to?  [id number] [place], [place] - [address] AT [facility] OF [address] & [address] Phone: [phone]  Fax: [phone]       5. Do they need a 30 day or 90 day supply? 90   "

## 2024-10-06 NOTE — Telephone Encounter (Signed)
 Refill sent

## 2024-10-13 ENCOUNTER — Other Ambulatory Visit (HOSPITAL_COMMUNITY): Payer: Self-pay

## 2024-10-13 ENCOUNTER — Telehealth: Payer: Self-pay | Admitting: Pharmacy Technician

## 2024-10-13 NOTE — Telephone Encounter (Signed)
" ° °  Patient normally gets through [employer] patient assistance. I called the patient and left her a message to call me back to see if she had received the patient portion of her multaq  application.    "

## 2024-10-13 NOTE — Telephone Encounter (Signed)
 I spoke to the patient and she said that they were told her insurance covers multaq  in 2026. I will try this on 10/16/24 to see what they say and notify the patient. She said she will try for pap if insurance does deny. She did receive the pap application in the mail

## 2024-10-16 ENCOUNTER — Other Ambulatory Visit (HOSPITAL_COMMUNITY): Payer: Self-pay

## 2024-10-16 NOTE — Telephone Encounter (Signed)
 Multaq  90 days on insurance [phone] Multaq  30 days on insurance 663.77   I spoke to the patient and she wants to try to get assistance through [place]. She will be by today or Monday with the application

## 2024-10-19 NOTE — Telephone Encounter (Signed)
" ° ° °  Received patient portion and faxed to [employer]  "

## 2024-10-20 ENCOUNTER — Other Ambulatory Visit (HOSPITAL_COMMUNITY): Payer: Self-pay

## 2024-10-20 NOTE — Telephone Encounter (Signed)
" ° °  Faxed missing info  "

## 2024-10-22 ENCOUNTER — Other Ambulatory Visit (INDEPENDENT_AMBULATORY_CARE_PROVIDER_SITE_OTHER)

## 2024-10-22 ENCOUNTER — Other Ambulatory Visit: Payer: Self-pay

## 2024-10-22 ENCOUNTER — Ambulatory Visit: Payer: Self-pay | Admitting: Internal Medicine

## 2024-10-22 DIAGNOSIS — E876 Hypokalemia: Secondary | ICD-10-CM | POA: Diagnosis not present

## 2024-10-22 LAB — COMPREHENSIVE METABOLIC PANEL WITH GFR
ALT: 16 U/L (ref 3–35)
AST: 21 U/L (ref 5–37)
Albumin: 4.4 g/dL (ref 3.5–5.2)
Alkaline Phosphatase: 79 U/L (ref 39–117)
BUN: 19 mg/dL (ref 6–23)
CO2: 25 meq/L (ref 19–32)
Calcium: 9.5 mg/dL (ref 8.4–10.5)
Chloride: 98 meq/L (ref 96–112)
Creatinine, Ser: 0.91 mg/dL (ref 0.40–1.20)
GFR: 60.7 mL/min
Glucose, Bld: 76 mg/dL (ref 70–99)
Potassium: 4.2 meq/L (ref 3.5–5.1)
Sodium: 131 meq/L — ABNORMAL LOW (ref 135–145)
Total Bilirubin: 0.5 mg/dL (ref 0.2–1.2)
Total Protein: 7.5 g/dL (ref 6.0–8.3)

## 2024-10-22 LAB — CBC WITH DIFFERENTIAL/PLATELET
Basophils Absolute: 0.1 K/uL (ref 0.0–0.1)
Basophils Relative: 1.4 % (ref 0.0–3.0)
Eosinophils Absolute: 0.1 K/uL (ref 0.0–0.7)
Eosinophils Relative: 2.9 % (ref 0.0–5.0)
HCT: 35.6 % — ABNORMAL LOW (ref 36.0–46.0)
Hemoglobin: 11.9 g/dL — ABNORMAL LOW (ref 12.0–15.0)
Lymphocytes Relative: 27.9 % (ref 12.0–46.0)
Lymphs Abs: 1.1 K/uL (ref 0.7–4.0)
MCHC: 33.6 g/dL (ref 30.0–36.0)
MCV: 95.5 fl (ref 78.0–100.0)
Monocytes Absolute: 0.2 K/uL (ref 0.1–1.0)
Monocytes Relative: 6.1 % (ref 3.0–12.0)
Neutro Abs: 2.5 K/uL (ref 1.4–7.7)
Neutrophils Relative %: 61.7 % (ref 43.0–77.0)
Platelets: 213 K/uL (ref 150.0–400.0)
RBC: 3.73 Mil/uL — ABNORMAL LOW (ref 3.87–5.11)
RDW: 14.7 % (ref 11.5–15.5)
WBC: 4 K/uL (ref 4.0–10.5)

## 2024-10-22 LAB — POTASSIUM: Potassium: 4.2 meq/L (ref 3.5–5.1)

## 2024-10-26 NOTE — Telephone Encounter (Signed)
" ° ° °  Multaq  application approved [employer] until 10/14/25  "

## 2024-10-26 NOTE — Telephone Encounter (Signed)
 I called [employer] and they received the information that was missing. She said 3-5 business days is normal time frame but with renewal season that could take longer. They will let fax us  with the determination to [phone]. I asked if they can fax to our fax [phone] but she said she can't promise it will fax to our fax.

## 2024-10-28 ENCOUNTER — Telehealth: Payer: Self-pay | Admitting: Pharmacist

## 2024-10-28 NOTE — Telephone Encounter (Signed)
 Contacted patient to let her know Multaq  patient assistance has arrived and will be on 5th floor

## 2024-10-29 ENCOUNTER — Other Ambulatory Visit: Payer: Self-pay | Admitting: Obstetrics and Gynecology

## 2024-10-29 DIAGNOSIS — Z1231 Encounter for screening mammogram for malignant neoplasm of breast: Secondary | ICD-10-CM

## 2024-11-02 ENCOUNTER — Other Ambulatory Visit (HOSPITAL_COMMUNITY): Payer: Self-pay

## 2024-11-04 ENCOUNTER — Other Ambulatory Visit: Payer: Self-pay | Admitting: Internal Medicine

## 2024-11-05 NOTE — Progress Notes (Unsigned)
" °  Electrophysiology Office Note:   Date:  11/06/2024  ID:  Amanda Wilkins, DOB [birth year], MRN [id number]  Primary Cardiologist: Vina Gull, MD Primary Heart Failure: None Electrophysiologist: Will Gladis Norton, MD      History of Present Illness:   Amanda Wilkins is a 79 y.o. female with h/o AF, AFL, PVC's, HTN, HLD, mild MR, chronic edema, interstitial nephritis, OSA on CPAP seen today for routine electrophysiology followup.   Since last being seen in our clinic the patient reports doing well on Multaq . She has not had significant episodes of AF.  She does note she has occasional very brief palpitations that last less than 10 seconds. She is recovering well from her knee surgery.  She denies chest pain, palpitations, dyspnea, PND, orthopnea, nausea, vomiting, dizziness, syncope, edema, weight gain, or early satiety.   Review of systems complete and found to be negative unless listed in HPI.   EP Information / Studies Reviewed:    EKG is ordered today. Personal review as below.  EKG Interpretation Date/Time:  Friday November 06 2024 11:11:39 EST Ventricular Rate:  77 PR Interval:  208 QRS Duration:  84 QT Interval:  414 QTC Calculation: 468 R Axis:   45  Text Interpretation: Normal sinus rhythm with sinus arrhythmia Confirmed by Aniceto Jarvis ([id number]) on 11/06/2024 11:25:08 AM   Arrhythmia / AAD / Pertinent EP Studies AF  AFL Flecainide  + Metoprolol  > stopped due to bradycardia   Amiodarone  initiated 06/25/24 > stopped after a couple of weeks out of concern for side effects (did not have actual side effects) Multaq  07/2024 >   Physical Exam:   VS:  BP 130/82   Pulse 77   Ht 5' 5 (1.651 m)   Wt 237 lb (107.5 kg)   SpO2 96%   BMI 39.44 kg/m    Wt Readings from Last 3 Encounters:  11/06/24 237 lb (107.5 kg)  09/07/24 232 lb (105.2 kg)  08/28/24 232 lb (105.2 kg)     GEN: Well nourished, well developed in no acute distress NECK: No JVD; No carotid  bruits CARDIAC: Regular rate and rhythm, no murmurs, rubs, gallops RESPIRATORY:  Clear to auscultation without rales, wheezing or rhonchi  ABDOMEN: Soft, non-tender, non-distended EXTREMITIES:  No edema; No deformity   Risk Assessment/Calculations:    CHA2DS2-VASc Score = 6   This indicates a 9.7% annual risk of stroke. The patient's score is based upon: CHF History: 1 HTN History: 1 Diabetes History: 0 Stroke History: 0 Vascular Disease History: 1 Age Score: 2 Gender Score: 1      ASSESSMENT AND PLAN:    Paroxysmal Atrial Fibrillation  CHA2DS2-VASc 6 -EKG with SR, stable intervals   -continue Multaq  400 mg BID  -change metoprolol  to 25mg  in am and 12.5 mg in PM    Secondary Hypercoagulable State  -continue Eliquis  5mg  BID, dose reviewed and appropriate by age / wt   Hypertension  -well controlled on current regimen   PVC's  -multaq  as above    Follow up with EP APP in 6 months  Signed, Jarvis Aniceto, NP-C, AGACNP-BC [facility] - Electrophysiology  11/06/2024, 11:25 AM  "

## 2024-11-06 ENCOUNTER — Ambulatory Visit: Attending: Pulmonary Disease | Admitting: Pulmonary Disease

## 2024-11-06 ENCOUNTER — Encounter: Payer: Self-pay | Admitting: Pulmonary Disease

## 2024-11-06 VITALS — BP 130/82 | HR 77 | Ht 65.0 in | Wt 237.0 lb

## 2024-11-06 DIAGNOSIS — D6869 Other thrombophilia: Secondary | ICD-10-CM | POA: Insufficient documentation

## 2024-11-06 DIAGNOSIS — I480 Paroxysmal atrial fibrillation: Secondary | ICD-10-CM | POA: Insufficient documentation

## 2024-11-06 DIAGNOSIS — I493 Ventricular premature depolarization: Secondary | ICD-10-CM | POA: Insufficient documentation

## 2024-11-06 DIAGNOSIS — I10 Essential (primary) hypertension: Secondary | ICD-10-CM | POA: Diagnosis present

## 2024-11-06 MED ORDER — METOPROLOL TARTRATE 25 MG PO TABS: ORAL_TABLET | ORAL | 3 refills | Status: AC

## 2024-11-06 NOTE — Patient Instructions (Signed)
 Medication Instructions:  Change metoprolol  tartrate (Lopressor ) to 25 mg (1 tablet) every morning and 12.5 mg (1/2 tablet) every night *If you need a refill on your cardiac medications before your next appointment, please call your pharmacy*  Lab Work: None ordered If you have labs (blood work) drawn today and your tests are completely normal, you will receive your results only by: MyChart Message (if you have MyChart) OR A paper copy in the mail If you have any lab test that is abnormal or we need to change your treatment, we will call you to review the results.  Follow-Up: At [facility], you and your health needs are our priority.  As part of our continuing mission to provide you with exceptional heart care, our providers are all part of one team.  This team includes your primary Cardiologist (physician) and Advanced Practice Providers or APPs (Physician Assistants and Nurse Practitioners) who all work together to provide you with the care you need, when you need it.  Your next appointment:   6 month(s)  Provider:   Daphne Barrack, NP

## 2024-11-07 ENCOUNTER — Other Ambulatory Visit: Payer: Self-pay | Admitting: Internal Medicine

## 2024-11-07 MED ORDER — HYDROCODONE-ACETAMINOPHEN 5-325 MG PO TABS: 1.0000 | ORAL_TABLET | Freq: Two times a day (BID) | ORAL | 0 refills | Status: AC | PRN

## 2024-11-09 ENCOUNTER — Ambulatory Visit: Payer: Self-pay

## 2024-11-09 ENCOUNTER — Encounter: Payer: Self-pay | Admitting: Internal Medicine
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
## 2024-11-09 NOTE — Progress Notes (Unsigned)
" ° ° °  Subjective:    Patient ID: Amanda Wilkins, female    DOB: 29-Aug-1946, 79 y.o.   MRN: [id number]      HPI Olla is here for No chief complaint on file.   She has RA and osteoarthritis.       Medications and allergies reviewed with patient and updated if appropriate.  Medications Ordered Prior to Encounter[1]  Review of Systems     Objective:  There were no vitals filed for this visit. BP Readings from Last 3 Encounters:  11/06/24 130/82  09/09/24 (!) 144/59  08/28/24 (!) 124/58   Wt Readings from Last 3 Encounters:  11/06/24 237 lb (107.5 kg)  09/07/24 232 lb (105.2 kg)  08/28/24 232 lb (105.2 kg)   There is no height or weight on file to calculate BMI.    Physical Exam         Assessment & Plan:    See Problem List for Assessment and Plan of chronic medical problems.         [1]  Current Outpatient Medications on File Prior to Visit  Medication Sig Dispense Refill   ALPRAZolam  (XANAX ) 0.25 MG tablet Take 1 tablet (0.25 mg total) by mouth 2 (two) times daily as needed for anxiety. 60 tablet 3   cholecalciferol  (VITAMIN D3) 25 MCG (1000 UT) tablet Take 1,000 Units by mouth daily.     dronedarone  (MULTAQ ) 400 MG tablet Take 1 tablet (400 mg total) by mouth 2 (two) times daily with a meal. 180 tablet 1   ELIQUIS  5 MG TABS tablet TAKE 1 TABLET(5 MG) BY MOUTH TWICE DAILY 60 tablet 5   famotidine  (PEPCID ) 20 MG tablet TAKE 1 TABLET(20 MG) BY MOUTH AT BEDTIME 90 tablet 3   furosemide  (LASIX ) 40 MG tablet Take 1 tablet (40 mg total) by mouth daily as needed for fluid or edema. 90 tablet 3   HYDROcodone -acetaminophen  (NORCO/VICODIN) 5-325 MG tablet Take 1 tablet by mouth 2 (two) times daily as needed for severe pain (pain score 7-10). 60 tablet 0   hydroxychloroquine  (PLAQUENIL ) 200 MG tablet Take 400 mg by mouth in the morning.     levalbuterol  (XOPENEX  HFA) 45 MCG/ACT inhaler Inhale 2 puffs into the lungs every 6 (six) hours as needed for  wheezing. 1 each 12   metoprolol  tartrate (LOPRESSOR ) 25 MG tablet Take 25 mg (1 tablet) by mouth every morning and 12.5 mg (1/2 tablet) by mouth every night 135 tablet 3   ondansetron  (ZOFRAN ) 4 MG tablet Take 1 tablet (4 mg total) by mouth every 6 (six) hours as needed for nausea. 20 tablet 0   potassium chloride  (KLOR-CON ) 10 MEQ tablet Take 2 tablets by mouth daily 180 tablet 3   rosuvastatin  (CRESTOR ) 5 MG tablet TAKE 1 TABLET(5 MG) BY MOUTH DAILY 90 tablet 3   sertraline  (ZOLOFT ) 50 MG tablet TAKE 1 TABLET BY MOUTH DAILY 90 tablet 1   triamcinolone  ointment (KENALOG ) 0.1 % Apply 1 Application topically 2 (two) times daily as needed (for irritation- affected site).     TYLENOL  500 MG tablet Take 1,000 mg by mouth every 6 (six) hours as needed for mild pain (pain score 1-3).     No current facility-administered medications on file prior to visit.   "

## 2024-11-10 ENCOUNTER — Ambulatory Visit: Admitting: Internal Medicine

## 2024-11-10 ENCOUNTER — Ambulatory Visit: Admitting: Student

## 2024-11-10 VITALS — BP 126/68 | HR 67 | Temp 98.0°F | Ht 65.0 in | Wt 232.0 lb

## 2024-11-10 DIAGNOSIS — R0789 Other chest pain: Secondary | ICD-10-CM

## 2024-11-10 DIAGNOSIS — T7840XA Allergy, unspecified, initial encounter: Secondary | ICD-10-CM | POA: Diagnosis not present

## 2024-11-10 NOTE — Patient Instructions (Addendum)
" ° ° ° ° ° °  Medications changes include :   tylenol , claritin      Return if symptoms worsen or fail to improve.  "

## 2024-11-11 ENCOUNTER — Other Ambulatory Visit: Payer: Self-pay | Admitting: Internal Medicine

## 2024-11-11 DIAGNOSIS — I4819 Other persistent atrial fibrillation: Secondary | ICD-10-CM

## 2024-11-11 NOTE — Telephone Encounter (Signed)
 Eliquis  5mg  refill request received. Patient is 79 years old, weight-105.2kg, Crea-0.91 on 10/22/24, Diagnosis-Afib, and last seen by Daphne Barrack on 11/06/24. Dose is appropriate based on dosing criteria. Will send in refill to requested pharmacy.

## 2024-11-26 ENCOUNTER — Ambulatory Visit

## 2025-03-31 ENCOUNTER — Ambulatory Visit
